# Patient Record
Sex: Male | Born: 1950 | Race: Black or African American | Hispanic: No | Marital: Married | Smoking: Never smoker
Health system: Southern US, Community
[De-identification: ages and names within clinical notes are randomized; demographics above are authoritative.]

## PROBLEM LIST (undated history)

## (undated) DIAGNOSIS — I10 Essential (primary) hypertension: Secondary | ICD-10-CM

## (undated) DIAGNOSIS — E669 Obesity, unspecified: Secondary | ICD-10-CM

## (undated) DIAGNOSIS — N183 Chronic kidney disease, stage 3 (moderate): Secondary | ICD-10-CM

## (undated) DIAGNOSIS — E785 Hyperlipidemia, unspecified: Secondary | ICD-10-CM

## (undated) DIAGNOSIS — R972 Elevated prostate specific antigen [PSA]: Secondary | ICD-10-CM

## (undated) DIAGNOSIS — I639 Cerebral infarction, unspecified: Secondary | ICD-10-CM

## (undated) DIAGNOSIS — C3412 Malignant neoplasm of upper lobe, left bronchus or lung: Secondary | ICD-10-CM

## (undated) DIAGNOSIS — K5730 Diverticulosis of large intestine without perforation or abscess without bleeding: Secondary | ICD-10-CM

## (undated) DIAGNOSIS — K648 Other hemorrhoids: Secondary | ICD-10-CM

## (undated) DIAGNOSIS — E7800 Pure hypercholesterolemia, unspecified: Secondary | ICD-10-CM

## (undated) DIAGNOSIS — I2699 Other pulmonary embolism without acute cor pulmonale: Secondary | ICD-10-CM

## (undated) DIAGNOSIS — E119 Type 2 diabetes mellitus without complications: Secondary | ICD-10-CM

## (undated) DIAGNOSIS — Z972 Presence of dental prosthetic device (complete) (partial): Secondary | ICD-10-CM

## (undated) HISTORY — DX: Chronic kidney disease, stage 3 (moderate): N18.3

## (undated) HISTORY — DX: Obesity, unspecified: E66.9

## (undated) HISTORY — DX: Hyperlipidemia, unspecified: E78.5

## (undated) HISTORY — DX: Type 2 diabetes mellitus without complications: E11.9

## (undated) HISTORY — PX: COLONOSCOPY: SHX174

## (undated) HISTORY — DX: Elevated prostate specific antigen (PSA): R97.20

## (undated) HISTORY — DX: Diverticulosis of large intestine without perforation or abscess without bleeding: K57.30

## (undated) HISTORY — DX: Pure hypercholesterolemia, unspecified: E78.00

## (undated) HISTORY — DX: Other hemorrhoids: K64.8

## (undated) HISTORY — DX: Malignant neoplasm of upper lobe, left bronchus or lung: C34.12

---

## 2009-12-02 ENCOUNTER — Ambulatory Visit: Payer: Self-pay | Admitting: Gastroenterology

## 2012-08-08 DIAGNOSIS — R972 Elevated prostate specific antigen [PSA]: Secondary | ICD-10-CM

## 2012-08-08 DIAGNOSIS — N403 Nodular prostate with lower urinary tract symptoms: Secondary | ICD-10-CM | POA: Insufficient documentation

## 2012-08-08 DIAGNOSIS — N509 Disorder of male genital organs, unspecified: Secondary | ICD-10-CM | POA: Insufficient documentation

## 2012-08-08 HISTORY — DX: Elevated prostate specific antigen (PSA): R97.20

## 2015-03-07 ENCOUNTER — Telehealth: Payer: Self-pay | Admitting: *Deleted

## 2015-03-07 NOTE — Telephone Encounter (Signed)
Patient called and said that he wanted to speak with Dr Manuella Ghazi because he had hurt his back last week and had gone to urgent care.  Said on Tuesday his stomach started hurting so he was calling to tell Dr Manuella Ghazi.  I told him that Dr Manuella Ghazi was seeing patients.  I suggested that he make an appt, which would be about 2 weeks from now or if he felt that this was an acute problem, he should go to urgent.  Stated that he didn't want to make and appt.

## 2015-03-18 ENCOUNTER — Encounter: Payer: Self-pay | Admitting: Family Medicine

## 2015-03-18 ENCOUNTER — Other Ambulatory Visit: Payer: Self-pay | Admitting: Family Medicine

## 2015-03-18 ENCOUNTER — Ambulatory Visit (INDEPENDENT_AMBULATORY_CARE_PROVIDER_SITE_OTHER): Payer: Managed Care, Other (non HMO) | Admitting: Family Medicine

## 2015-03-18 VITALS — BP 120/70 | HR 75 | Temp 98.8°F | Resp 18 | Ht 64.0 in | Wt 155.0 lb

## 2015-03-18 DIAGNOSIS — E785 Hyperlipidemia, unspecified: Secondary | ICD-10-CM | POA: Insufficient documentation

## 2015-03-18 DIAGNOSIS — N183 Chronic kidney disease, stage 3 unspecified: Secondary | ICD-10-CM | POA: Insufficient documentation

## 2015-03-18 DIAGNOSIS — R1013 Epigastric pain: Secondary | ICD-10-CM | POA: Diagnosis not present

## 2015-03-18 DIAGNOSIS — E119 Type 2 diabetes mellitus without complications: Secondary | ICD-10-CM

## 2015-03-18 DIAGNOSIS — E1122 Type 2 diabetes mellitus with diabetic chronic kidney disease: Secondary | ICD-10-CM | POA: Insufficient documentation

## 2015-03-18 HISTORY — DX: Hyperlipidemia, unspecified: E78.5

## 2015-03-18 MED ORDER — LISINOPRIL 10 MG PO TABS: 10.0000 mg | ORAL_TABLET | Freq: Every day | ORAL | Status: DC

## 2015-03-18 MED ORDER — METFORMIN HCL 1000 MG PO TABS: 1000.0000 mg | ORAL_TABLET | Freq: Two times a day (BID) | ORAL | Status: DC

## 2015-03-18 NOTE — Progress Notes (Signed)
Name: Melvin Willis   MRN: [id number]    DOB: 01-29-1951   Date:03/18/2015       Progress Note  Subjective  Chief Complaint  Chief Complaint  Patient presents with  . Follow-up    3 mo. DM  . Diabetes    Diabetes He presents for his follow-up diabetic visit. He has type 2 diabetes mellitus. Pertinent negatives for diabetes include no polydipsia and no polyuria. Current diabetic treatment includes oral agent (dual therapy). He is following a generally healthy diet. His breakfast blood glucose is taken between 8-9 am. His breakfast blood glucose range is generally 90-110 mg/dl.  Abdominal Pain This is a new problem. The current episode started 1 to 4 weeks ago. The onset quality is gradual. The most recent episode lasted 2 weeks. The pain is located in the epigastric region. The pain is at a severity of 2/10. The pain is mild. The quality of the pain is aching. The abdominal pain radiates to the epigastric region and LUQ. Associated symptoms include nausea. Pertinent negatives include no belching, constipation, diarrhea, hematochezia or vomiting. He has tried nothing for the symptoms. There is no history of gallstones, GERD or pancreatitis.    Past Medical History  Diagnosis Date  . Diabetes mellitus without complication   . Hyperlipidemia     History reviewed. No pertinent past surgical history.  Family History  Problem Relation Age of Onset  . Diabetes Mother   . Diabetes Father   . Diabetes Sister     History   Social History  . Marital Status: Married    Spouse Name: N/A  . Number of Children: N/A  . Years of Education: N/A   Occupational History  . Not on file.   Social History Main Topics  . Smoking status: Never Smoker   . Smokeless tobacco: Never Used  . Alcohol Use: No  . Drug Use: No  . Sexual Activity:    Partners: Female   Other Topics Concern  . Not on file   Social History Narrative  . No narrative on file     Current outpatient  prescriptions:  .  aspirin (BAYER ASPIRIN EC LOW DOSE) 81 MG EC tablet, Take 1 tablet by mouth daily., Disp: , Rfl:  .  finasteride (PROSCAR) 5 MG tablet, Take 1 tablet by mouth daily., Disp: , Rfl:  .  glipiZIDE (GLUCOTROL) 5 MG tablet, Take 2 tablets by mouth 2 (two) times daily at 8 am and 10 pm., Disp: , Rfl:  .  lisinopril (PRINIVIL,ZESTRIL) 10 MG tablet, Take 1 tablet (10 mg total) by mouth daily., Disp: 90 tablet, Rfl: 0 .  metFORMIN (GLUCOPHAGE) 1000 MG tablet, Take 1 tablet (1,000 mg total) by mouth 2 (two) times daily., Disp: 180 tablet, Rfl: 0 .  simvastatin (ZOCOR) 40 MG tablet, Take 1 tablet by mouth daily., Disp: , Rfl:  .  tamsulosin (FLOMAX) 0.4 MG CAPS capsule, Take 1 capsule by mouth daily., Disp: , Rfl:   No Known Allergies   Review of Systems  Gastrointestinal: Positive for nausea and abdominal pain. Negative for vomiting, diarrhea, constipation and hematochezia.  Endo/Heme/Allergies: Negative for polydipsia.      Objective  Filed Vitals:   03/18/15 0817  BP: 120/70  Pulse: 75  Temp: 98.8 F (37.1 C)  TempSrc: Oral  Resp: 18  Height: '5\' 4"'$  (1.626 m)  Weight: 155 lb (70.308 kg)  SpO2: 97%    Physical Exam  Constitutional: He is well-developed, well-nourished, and in no  distress.  HENT:  Head: Normocephalic and atraumatic.  Cardiovascular: Normal rate and regular rhythm.   Pulmonary/Chest: Effort normal and breath sounds normal.  Abdominal: Bowel sounds are normal. There is tenderness in the right upper quadrant, epigastric area and left upper quadrant.       No results found for this or any previous visit (from the past 2160 hour(s)).   Assessment & Plan 1. Diabetes mellitus without complication  - HgB I7P - metFORMIN (GLUCOPHAGE) 1000 MG tablet; Take 1 tablet (1,000 mg total) by mouth 2 (two) times daily.  Dispense: 180 tablet; Refill: 0 - lisinopril (PRINIVIL,ZESTRIL) 10 MG tablet; Take 1 tablet (10 mg total) by mouth daily.  Dispense: 90  tablet; Refill: 0  2. Dyslipidemia  - Lipid Profile - Comprehensive metabolic panel  3. Epigastric abdominal pain Discussed likely etiologies for acute epigastric abdominal pain, and nausea, most likely from gastritis. We will refer to gastroenterology for consideration of endoscopy and have recommended starting Zantac 150 mg at bedtime for relief. - Ambulatory referral to Gastroenterology - CBC - Amylase - Lipase   Melvin Willis Asad A. [facility] 03/18/2015 6:57 PM

## 2015-03-19 LAB — COMPREHENSIVE METABOLIC PANEL
ALT: 26 IU/L (ref 0–44)
AST: 17 IU/L (ref 0–40)
Albumin/Globulin Ratio: 1.5 (ref 1.1–2.5)
Albumin: 4.3 g/dL (ref 3.6–4.8)
Alkaline Phosphatase: 78 IU/L (ref 39–117)
BUN/Creatinine Ratio: 15 (ref 10–22)
BUN: 17 mg/dL (ref 8–27)
Bilirubin Total: 0.4 mg/dL (ref 0.0–1.2)
CALCIUM: 9.8 mg/dL (ref 8.6–10.2)
CO2: 21 mmol/L (ref 18–29)
Chloride: 97 mmol/L (ref 97–108)
Creatinine, Ser: 1.11 mg/dL (ref 0.76–1.27)
GFR calc Af Amer: 82 mL/min/{1.73_m2} (ref >59)
GFR calc non Af Amer: 71 mL/min/{1.73_m2} (ref >59)
GLOBULIN, TOTAL: 2.8 g/dL (ref 1.5–4.5)
GLUCOSE: 53 mg/dL — AB (ref 65–99)
POTASSIUM: 4.6 mmol/L (ref 3.5–5.2)
SODIUM: 136 mmol/L (ref 134–144)
Total Protein: 7.1 g/dL (ref 6.0–8.5)

## 2015-03-19 LAB — CBC
HEMATOCRIT: 45.1 % (ref 37.5–51.0)
Hemoglobin: 15.5 g/dL (ref 12.6–17.7)
MCH: 29.4 pg (ref 26.6–33.0)
MCHC: 34.4 g/dL (ref 31.5–35.7)
MCV: 86 fL (ref 79–97)
PLATELETS: 287 10*3/uL (ref 150–379)
RBC: 5.27 x10E6/uL (ref 4.14–5.80)
RDW: 14.2 % (ref 12.3–15.4)
WBC: 7.3 10*3/uL (ref 3.4–10.8)

## 2015-03-19 LAB — HEMOGLOBIN A1C
ESTIMATED AVERAGE GLUCOSE: 140 mg/dL
HEMOGLOBIN A1C: 6.5 % — AB (ref 4.8–5.6)

## 2015-03-19 LAB — LIPASE: LIPASE: 21 U/L (ref 0–59)

## 2015-03-19 LAB — LIPID PANEL
CHOLESTEROL TOTAL: 197 mg/dL (ref 100–199)
Chol/HDL Ratio: 3.8 ratio units (ref 0.0–5.0)
HDL: 52 mg/dL (ref >39)
LDL Calculated: 129 mg/dL — ABNORMAL HIGH (ref 0–99)
Triglycerides: 79 mg/dL (ref 0–149)
VLDL Cholesterol Cal: 16 mg/dL (ref 5–40)

## 2015-03-19 LAB — AMYLASE: Amylase: 85 U/L (ref 31–124)

## 2015-03-20 ENCOUNTER — Telehealth: Payer: Self-pay | Admitting: Gastroenterology

## 2015-03-20 NOTE — Telephone Encounter (Signed)
no Josem Kaufmann is req with [employer] for cpt: [id number], ref: [id number].

## 2015-04-16 ENCOUNTER — Ambulatory Visit (INDEPENDENT_AMBULATORY_CARE_PROVIDER_SITE_OTHER): Payer: Managed Care, Other (non HMO) | Admitting: Gastroenterology

## 2015-04-16 ENCOUNTER — Other Ambulatory Visit: Payer: Self-pay

## 2015-04-16 VITALS — BP 127/77 | HR 78 | Temp 98.3°F | Ht 65.0 in | Wt 152.2 lb

## 2015-04-16 DIAGNOSIS — K648 Other hemorrhoids: Secondary | ICD-10-CM

## 2015-04-16 DIAGNOSIS — K5730 Diverticulosis of large intestine without perforation or abscess without bleeding: Secondary | ICD-10-CM

## 2015-04-16 DIAGNOSIS — R110 Nausea: Secondary | ICD-10-CM

## 2015-04-16 DIAGNOSIS — E7800 Pure hypercholesterolemia, unspecified: Secondary | ICD-10-CM

## 2015-04-16 DIAGNOSIS — E663 Overweight: Secondary | ICD-10-CM | POA: Insufficient documentation

## 2015-04-16 DIAGNOSIS — R194 Change in bowel habit: Secondary | ICD-10-CM

## 2015-04-16 DIAGNOSIS — E669 Obesity, unspecified: Secondary | ICD-10-CM

## 2015-04-16 DIAGNOSIS — R1013 Epigastric pain: Secondary | ICD-10-CM

## 2015-04-16 HISTORY — DX: Diverticulosis of large intestine without perforation or abscess without bleeding: K57.30

## 2015-04-16 HISTORY — DX: Pure hypercholesterolemia, unspecified: E78.00

## 2015-04-16 HISTORY — DX: Obesity, unspecified: E66.9

## 2015-04-16 HISTORY — DX: Other hemorrhoids: K64.8

## 2015-04-16 NOTE — Progress Notes (Signed)
Gastroenterology Consultation  Referring Provider:     Roselee Nova, MD Primary Care Physician:  Melvin Rake, MD Primary Gastroenterologist:  Dr. Allen Willis     Reason for Consultation:     Change in bowel habits and nausea        HPI:   Melvin Willis is a 64 y.o. y/o male referred for consultation & management of change in bowel habits and nausea by Dr. Keith Rake, MD.  This patient comes in today with a report of 1 month history of a change in bowel habits with altered alternating diarrhea and normal stools. The patient also reports that he has had some nausea. The patient was feeling better when he was given Zantac but states he only took it for short time and stopped taking it. There is no report of any vomiting with the nausea. He also denies any black stools or bloody stools. The patient has no family history of colon cancer colon polyps or any GI malignancies that he is aware of. He reports that he has a 5 pound discrepancy between his visit today and his last visit and his primary care provider's office. The patient denies being woken up in the middle night by his diarrhea. The patient denies any overt heartburn.  Past Medical History  Diagnosis Date  . Diabetes mellitus without complication   . Hyperlipidemia   . Hemorrhoids, internal 04/16/2015  . Diverticulosis of sigmoid colon 04/16/2015  . Dyslipidemia 03/18/2015  . Abnormal prostate specific antigen 08/08/2012  . Hypercholesteremia 04/16/2015  . Adiposity 04/16/2015    History reviewed. No pertinent past surgical history.  Prior to Admission medications   Medication Sig Start Date End Date Taking? Authorizing Provider  aspirin (BAYER ASPIRIN EC LOW DOSE) 81 MG EC tablet Take 1 tablet by mouth daily.   Yes Historical Provider, MD  finasteride (PROSCAR) 5 MG tablet Take 1 tablet by mouth daily. 03/04/15  Yes Historical Provider, MD  glipiZIDE (GLUCOTROL) 5 MG tablet Take 2 tablets by mouth 2 (two) times daily at 8 am and 10 pm.  01/24/15  Yes Historical Provider, MD  lisinopril (PRINIVIL,ZESTRIL) 10 MG tablet Take 1 tablet (10 mg total) by mouth daily. 03/18/15  Yes Melvin Nova, MD  metFORMIN (GLUCOPHAGE) 1000 MG tablet Take 1 tablet (1,000 mg total) by mouth 2 (two) times daily. 03/18/15  Yes Melvin Nova, MD  simvastatin (ZOCOR) 40 MG tablet Take 1 tablet by mouth daily. 12/04/14  Yes Historical Provider, MD  tamsulosin (FLOMAX) 0.4 MG CAPS capsule Take 1 capsule by mouth daily. 01/24/15  Yes Historical Provider, MD    Family History  Problem Relation Age of Onset  . Diabetes Mother   . Diabetes Father   . Diabetes Sister      History  Substance Use Topics  . Smoking status: Never Smoker   . Smokeless tobacco: Never Used  . Alcohol Use: No    Allergies as of 04/16/2015  . (No Known Allergies)    Review of Systems:    All systems reviewed and negative except where noted in HPI.   Physical Exam:  BP 127/77 mmHg  Pulse 78  Temp(Src) 98.3 F (36.8 C) (Oral)  Ht '5\' 5"'$  (1.651 m)  Wt 152 lb 3.2 oz (69.037 kg)  BMI 25.33 kg/m2 No LMP for male patient. Psych:  Alert and cooperative. Normal mood and affect. General:   Alert,  Well-developed, well-nourished, pleasant and cooperative in NAD Head:  Normocephalic  and atraumatic. Eyes:  Sclera clear, no icterus.   Conjunctiva pink. Ears:  Normal auditory acuity. Nose:  No deformity, discharge, or lesions. Mouth:  No deformity or lesions,oropharynx pink & moist. Neck:  Supple; no masses or thyromegaly. Lungs:  Respirations even and unlabored.  Clear throughout to auscultation.   No wheezes, crackles, or rhonchi. No acute distress. Heart:  Regular rate and rhythm; no murmurs, clicks, rubs, or gallops. Abdomen:  Normal bowel sounds.  No bruits.  Soft, non-tender and non-distended without masses, hepatosplenomegaly or hernias noted.  No guarding or rebound tenderness.  Negative Carnett sign.   Rectal:  Deferred.  Msk:  Symmetrical without gross  deformities.  Good, equal movement & strength bilaterally. Pulses:  Normal pulses noted. Extremities:  No clubbing or edema.  No cyanosis. Neurologic:  Alert and oriented x3;  grossly normal neurologically. Skin:  Intact without significant lesions or rashes.  No jaundice. Lymph Nodes:  No significant cervical adenopathy. Psych:  Alert and cooperative. Normal mood and affect.  Imaging Studies: No results found.  Assessment and Plan:   Melvin Willis is a 64 y.o. y/o male comes in today with a report of 1 month long history of a change in bowel habits with intermittent diarrhea and nausea. The patient was being helped by Zantac but stopped taking it. The patient then reports the symptoms came back. The patient will be set up for an EGD and colonoscopy to evaluate his change in bowel habits and his nausea. The patient reports that his last colonoscopy was over 10 years ago. The patient will also be set up for blood test to check for H. pylori. The patient has been explained the plan and agrees with it. I have discussed risks & benefits which include, but are not limited to, bleeding, infection, perforation & drug reaction.  The patient agrees with this plan & written consent will be obtained.      Note: This dictation was prepared with Dragon dictation along with smaller phrase technology. Any transcriptional errors that result from this process are unintentional.

## 2015-05-01 ENCOUNTER — Encounter: Payer: Self-pay | Admitting: *Deleted

## 2015-05-03 NOTE — Discharge Instructions (Signed)

## 2015-05-06 ENCOUNTER — Other Ambulatory Visit: Payer: Self-pay | Admitting: Gastroenterology

## 2015-05-06 ENCOUNTER — Ambulatory Visit
Admission: RE | Admit: 2015-05-06 | Discharge: 2015-05-06 | Disposition: A | Discharge status: Home / Self Care | Payer: Managed Care, Other (non HMO) | Source: Ambulatory Visit | Attending: Gastroenterology | Admitting: Gastroenterology

## 2015-05-06 ENCOUNTER — Ambulatory Visit: Payer: Managed Care, Other (non HMO) | Admitting: Anesthesiology

## 2015-05-06 ENCOUNTER — Encounter
Admission: RE | Disposition: A | Discharge status: Home / Self Care | Payer: Self-pay | Source: Ambulatory Visit | Attending: Gastroenterology

## 2015-05-06 DIAGNOSIS — K635 Polyp of colon: Secondary | ICD-10-CM | POA: Insufficient documentation

## 2015-05-06 DIAGNOSIS — K2950 Unspecified chronic gastritis without bleeding: Secondary | ICD-10-CM | POA: Insufficient documentation

## 2015-05-06 DIAGNOSIS — D123 Benign neoplasm of transverse colon: Secondary | ICD-10-CM | POA: Insufficient documentation

## 2015-05-06 DIAGNOSIS — D122 Benign neoplasm of ascending colon: Secondary | ICD-10-CM | POA: Insufficient documentation

## 2015-05-06 DIAGNOSIS — R110 Nausea: Secondary | ICD-10-CM | POA: Insufficient documentation

## 2015-05-06 DIAGNOSIS — E119 Type 2 diabetes mellitus without complications: Secondary | ICD-10-CM | POA: Diagnosis not present

## 2015-05-06 DIAGNOSIS — R194 Change in bowel habit: Secondary | ICD-10-CM | POA: Diagnosis not present

## 2015-05-06 DIAGNOSIS — D125 Benign neoplasm of sigmoid colon: Secondary | ICD-10-CM | POA: Insufficient documentation

## 2015-05-06 DIAGNOSIS — K5730 Diverticulosis of large intestine without perforation or abscess without bleeding: Secondary | ICD-10-CM | POA: Diagnosis not present

## 2015-05-06 DIAGNOSIS — Z7982 Long term (current) use of aspirin: Secondary | ICD-10-CM | POA: Insufficient documentation

## 2015-05-06 DIAGNOSIS — K641 Second degree hemorrhoids: Secondary | ICD-10-CM | POA: Diagnosis not present

## 2015-05-06 DIAGNOSIS — K209 Esophagitis, unspecified without bleeding: Secondary | ICD-10-CM | POA: Insufficient documentation

## 2015-05-06 DIAGNOSIS — E785 Hyperlipidemia, unspecified: Secondary | ICD-10-CM | POA: Diagnosis not present

## 2015-05-06 DIAGNOSIS — I10 Essential (primary) hypertension: Secondary | ICD-10-CM | POA: Insufficient documentation

## 2015-05-06 DIAGNOSIS — K2970 Gastritis, unspecified, without bleeding: Secondary | ICD-10-CM | POA: Diagnosis not present

## 2015-05-06 HISTORY — PX: COLONOSCOPY WITH PROPOFOL: SHX5780

## 2015-05-06 HISTORY — DX: Essential (primary) hypertension: I10

## 2015-05-06 HISTORY — DX: Presence of dental prosthetic device (complete) (partial): Z97.2

## 2015-05-06 HISTORY — PX: ESOPHAGOGASTRODUODENOSCOPY (EGD) WITH PROPOFOL: SHX5813

## 2015-05-06 LAB — GLUCOSE, CAPILLARY
Glucose-Capillary: 159 mg/dL — ABNORMAL HIGH (ref 65–99)
Glucose-Capillary: 128 mg/dL — ABNORMAL HIGH (ref 65–99)

## 2015-05-06 SURGERY — COLONOSCOPY WITH PROPOFOL
Anesthesia: Monitor Anesthesia Care | Wound class: Contaminated

## 2015-05-06 MED ORDER — SODIUM CHLORIDE 0.9 % IV SOLN: INTRAVENOUS | Status: DC

## 2015-05-06 MED ORDER — ACETAMINOPHEN 325 MG PO TABS: 325.0000 mg | ORAL_TABLET | ORAL | Status: DC | PRN

## 2015-05-06 MED ORDER — STERILE WATER FOR IRRIGATION IR SOLN
Status: DC | PRN
  Administered 2015-05-06: 08:00:00

## 2015-05-06 MED ORDER — GLYCOPYRROLATE 0.2 MG/ML IJ SOLN
INTRAMUSCULAR | Status: DC | PRN
  Administered 2015-05-06: 0.1 mg via INTRAVENOUS

## 2015-05-06 MED ORDER — ACETAMINOPHEN 160 MG/5ML PO SOLN: 325.0000 mg | ORAL | Status: DC | PRN

## 2015-05-06 MED ORDER — LACTATED RINGERS IV SOLN
INTRAVENOUS | Status: DC
  Administered 2015-05-06: 07:00:00 via INTRAVENOUS

## 2015-05-06 MED ORDER — LACTATED RINGERS IV SOLN: INTRAVENOUS | Status: DC

## 2015-05-06 MED ORDER — LIDOCAINE HCL (CARDIAC) 20 MG/ML IV SOLN
INTRAVENOUS | Status: DC | PRN
  Administered 2015-05-06: 50 mg via INTRAVENOUS

## 2015-05-06 MED ORDER — PROPOFOL 10 MG/ML IV BOLUS
INTRAVENOUS | Status: DC | PRN
  Administered 2015-05-06: 20 mg via INTRAVENOUS
  Administered 2015-05-06: 30 mg via INTRAVENOUS
  Administered 2015-05-06: 20 mg via INTRAVENOUS
  Administered 2015-05-06: 30 mg via INTRAVENOUS
  Administered 2015-05-06: 20 mg via INTRAVENOUS
  Administered 2015-05-06: 60 mg via INTRAVENOUS
  Administered 2015-05-06 (×3): 30 mg via INTRAVENOUS

## 2015-05-06 SURGICAL SUPPLY — 39 items
BALLN DILATOR 10-12 8 (BALLOONS)
BALLN DILATOR 12-15 8 (BALLOONS)
BALLN DILATOR 15-18 8 (BALLOONS)
BALLN DILATOR CRE 0-12 8 (BALLOONS)
BALLN DILATOR ESOPH 8 10 CRE (MISCELLANEOUS) IMPLANT
BALLOON DILATOR 12-15 8 (BALLOONS) IMPLANT
BALLOON DILATOR 15-18 8 (BALLOONS) IMPLANT
BALLOON DILATOR CRE 0-12 8 (BALLOONS) IMPLANT
BLOCK BITE 60FR ADLT L/F GRN (MISCELLANEOUS) ×3 IMPLANT
CANISTER SUCT 1200ML W/VALVE (MISCELLANEOUS) ×3 IMPLANT
FCP ESCP3.2XJMB 240X2.8X (MISCELLANEOUS)
FORCEPS BIOP RAD 4 LRG CAP 4 (CUTTING FORCEPS) ×3 IMPLANT
FORCEPS BIOP RJ4 240 W/NDL (MISCELLANEOUS)
FORCEPS ESCP3.2XJMB 240X2.8X (MISCELLANEOUS) IMPLANT
GOWN CVR UNV OPN BCK APRN NK (MISCELLANEOUS) ×2 IMPLANT
GOWN ISOL THUMB LOOP REG UNIV (MISCELLANEOUS) ×4
HEMOCLIP INSTINCT (CLIP) IMPLANT
INJECTOR VARIJECT VIN23 (MISCELLANEOUS) IMPLANT
KIT CO2 TUBING (TUBING) IMPLANT
KIT DEFENDO VALVE AND CONN (KITS) IMPLANT
KIT ENDO PROCEDURE OLY (KITS) ×3 IMPLANT
LIGATOR MULTIBAND 6SHOOTER MBL (MISCELLANEOUS) IMPLANT
MARKER SPOT ENDO TATTOO 5ML (MISCELLANEOUS) IMPLANT
PAD GROUND ADULT SPLIT (MISCELLANEOUS) IMPLANT
SNARE SHORT THROW 13M SML OVAL (MISCELLANEOUS) ×3 IMPLANT
SNARE SHORT THROW 30M LRG OVAL (MISCELLANEOUS) IMPLANT
SPOT EX ENDOSCOPIC TATTOO (MISCELLANEOUS)
SUCTION POLY TRAP 4CHAMBER (MISCELLANEOUS) IMPLANT
SYR INFLATION 60ML (SYRINGE) IMPLANT
TRAP SUCTION POLY (MISCELLANEOUS) IMPLANT
TUBING CONN 6MMX3.1M (TUBING)
TUBING SUCTION CONN 0.25 STRL (TUBING) IMPLANT
UNDERPAD 30X60 958B10 (PK) (MISCELLANEOUS) IMPLANT
VALVE BIOPSY ENDO (VALVE) IMPLANT
VARIJECT INJECTOR VIN23 (MISCELLANEOUS)
WATER AUXILLARY (MISCELLANEOUS) IMPLANT
WATER STERILE IRR 250ML POUR (IV SOLUTION) ×3 IMPLANT
WATER STERILE IRR 500ML POUR (IV SOLUTION) IMPLANT
WIRE CRE 18-20MM 8CM F G (MISCELLANEOUS) IMPLANT

## 2015-05-06 NOTE — Anesthesia Procedure Notes (Signed)
Procedure Name: MAC Performed by: Dejaun Vidrio Pre-anesthesia Checklist: Patient identified, Emergency Drugs available, Suction available, Timeout performed and Patient being monitored Patient Re-evaluated:Patient Re-evaluated prior to inductionOxygen Delivery Method: Nasal cannula Placement Confirmation: positive ETCO2       

## 2015-05-06 NOTE — Op Note (Signed)
[facility] Gastroenterology Patient Name: Melvin Willis Procedure Date: 05/06/2015 7:30 AM MRN: [id number] Account #: [id number] Date of Birth: Mar 31, 1951 Admit Type: Outpatient Age: 64 Room: [facility] OR ROOM 01 Gender: Male Note Status: Finalized Procedure:         Upper GI endoscopy Indications:       Nausea Providers:         Lucilla Lame, MD Referring MD:      Otila Back. Manuella Ghazi (Referring MD) Medicines:         Propofol per Anesthesia Complications:     No immediate complications. Procedure:         Pre-Anesthesia Assessment:                    - Prior to the procedure, a History and Physical was                     performed, and patient medications and allergies were                     reviewed. The patient's tolerance of previous anesthesia                     was also reviewed. The risks and benefits of the procedure                     and the sedation options and risks were discussed with the                     patient. All questions were answered, and informed consent                     was obtained. Prior Anticoagulants: The patient has taken                     no previous anticoagulant or antiplatelet agents. ASA                     Grade Assessment: II - A patient with mild systemic                     disease. After reviewing the risks and benefits, the                     patient was deemed in satisfactory condition to undergo                     the procedure.                    After obtaining informed consent, the endoscope was passed                     under direct vision. Throughout the procedure, the                     patient's blood pressure, pulse, and oxygen saturations                     were monitored continuously. The Olympus GIF-HQ190                     Endoscope (S#. [id number]) was introduced through the mouth,  and advanced to the second part of duodenum. The upper GI                     endoscopy was  accomplished without difficulty. The patient                     tolerated the procedure well. Findings:      LA Grade A (one or more mucosal breaks less than 5 mm, not extending       between tops of 2 mucosal folds) esophagitis with no bleeding was found       at the gastroesophageal junction.      Localized mild inflammation characterized by erythema was found in the       gastric antrum. Biopsies were taken with a cold forceps for histology.       Biopsies were taken with a cold forceps for histology.      The examined duodenum was normal. Impression:        - LA Grade A esophagitis.                    - Gastritis. Biopsied.                    - Normal examined duodenum. Recommendation:    - Await pathology results.                    - Perform a colonoscopy today. Procedure Code(s): --- Professional ---                    [phone], Esophagogastroduodenoscopy, flexible, transoral;                     with biopsy, single or multiple Diagnosis Code(s): --- Professional ---                    R11.0, Nausea                    K20.9, Esophagitis, unspecified                    K29.70, Gastritis, unspecified, without bleeding CPT copyright 2014 American Medical Association. All rights reserved. The codes documented in this report are preliminary and upon coder review may  be revised to meet current compliance requirements. Lucilla Lame, MD 05/06/2015 7:40:46 AM This report has been signed electronically. Number of Addenda: 0 Note Initiated On: 05/06/2015 7:30 AM      [facility]

## 2015-05-06 NOTE — Anesthesia Preprocedure Evaluation (Signed)
Anesthesia Evaluation  Patient identified by MRN, date of birth, ID band  Reviewed: Allergy & Precautions, H&P , NPO status , Patient's Chart, lab work & pertinent test results  Airway Mallampati: I  TM Distance: >3 FB Neck ROM: full    Dental no notable dental hx.    Pulmonary    Pulmonary exam normal       Cardiovascular hypertension, Rhythm:regular Rate:Normal     Neuro/Psych    GI/Hepatic   Endo/Other  diabetes  Renal/GU      Musculoskeletal   Abdominal   Peds  Hematology   Anesthesia Other Findings   Reproductive/Obstetrics                             Anesthesia Physical Anesthesia Plan  ASA: II  Anesthesia Plan: MAC   Post-op Pain Management:    Induction:   Airway Management Planned:   Additional Equipment:   Intra-op Plan:   Post-operative Plan:   Informed Consent: I have reviewed the patients History and Physical, chart, labs and discussed the procedure including the risks, benefits and alternatives for the proposed anesthesia with the patient or authorized representative who has indicated his/her understanding and acceptance.     Plan Discussed with: CRNA  Anesthesia Plan Comments:         Anesthesia Quick Evaluation

## 2015-05-06 NOTE — Op Note (Signed)
[facility] Gastroenterology Patient Name: Melvin Willis Procedure Date: 05/06/2015 7:41 AM MRN: [id number] Account #: [id number] Date of Birth: 1951/02/22 Admit Type: Outpatient Age: 64 Room: [facility] OR ROOM 01 Gender: Male Note Status: Finalized Procedure:         Colonoscopy Indications:       Change in bowel habits Providers:         Lucilla Lame, MD Referring MD:      Otila Back. Manuella Ghazi (Referring MD) Medicines:         Propofol per Anesthesia Complications:     No immediate complications. Procedure:         Pre-Anesthesia Assessment:                    - Prior to the procedure, a History and Physical was                     performed, and patient medications and allergies were                     reviewed. The patient's tolerance of previous anesthesia                     was also reviewed. The risks and benefits of the procedure                     and the sedation options and risks were discussed with the                     patient. All questions were answered, and informed consent                     was obtained. Prior Anticoagulants: The patient has taken                     no previous anticoagulant or antiplatelet agents. ASA                     Grade Assessment: II - A patient with mild systemic                     disease. After reviewing the risks and benefits, the                     patient was deemed in satisfactory condition to undergo                     the procedure.                    After obtaining informed consent, the colonoscope was                     passed under direct vision. Throughout the procedure, the                     patient's blood pressure, pulse, and oxygen saturations                     were monitored continuously. The was introduced through                     the anus and advanced to the the terminal ileum. The  colonoscopy was performed without difficulty. The patient                     tolerated  the procedure well. The quality of the bowel                     preparation was excellent. Findings:      The perianal and digital rectal examinations were normal.      Two sessile polyps were found in the ascending colon. The polyps were 4       to 5 mm in size. These polyps were removed with a cold biopsy forceps.       Resection and retrieval were complete.      A 7 mm polyp was found in the transverse colon. The polyp was sessile.       The polyp was removed with a cold snare. Resection and retrieval were       complete.      A 3 mm polyp was found in the sigmoid colon. The polyp was sessile. The       polyp was removed with a cold biopsy forceps. Resection and retrieval       were complete.      Multiple small-mouthed diverticula were found in the sigmoid colon.      Non-bleeding internal hemorrhoids were found during retroflexion. The       hemorrhoids were Grade II (internal hemorrhoids that prolapse but reduce       spontaneously).      The terminal ileum appeared normal. Biopsies were taken with a cold       forceps for histology.      Random biopsies were obtained with cold forceps for histology randomly       in the entire colon. Impression:        - Two 4 to 5 mm polyps in the ascending colon. Resected                     and retrieved.                    - One 7 mm polyp in the transverse colon. Resected and                     retrieved.                    - One 3 mm polyp in the sigmoid colon. Resected and                     retrieved.                    - Diverticulosis in the sigmoid colon.                    - Non-bleeding internal hemorrhoids.                    - The examined portion of the ileum was normal. Biopsied.                    - Random biopsies were obtained in the entire colon. Recommendation:    - Repeat colonoscopy in 5 years if polyp adenoma and 10                     years if hyperplastic Procedure  Code(s): --- Professional ---                     [phone], Colonoscopy, flexible; with removal of tumor(s),                     polyp(s), or other lesion(s) by snare technique                    [id number], 59, Colonoscopy, flexible; with biopsy, single or                     multiple Diagnosis Code(s): --- Professional ---                    R19.4, Change in bowel habit                    D12.2, Benign neoplasm of ascending colon                    D12.3, Benign neoplasm of transverse colon                    D12.5, Benign neoplasm of sigmoid colon                    K57.30, Diverticulosis of large intestine without                     perforation or abscess without bleeding CPT copyright 2014 American Medical Association. All rights reserved. The codes documented in this report are preliminary and upon coder review may  be revised to meet current compliance requirements. Lucilla Lame, MD 05/06/2015 8:00:56 AM This report has been signed electronically. Number of Addenda: 0 Note Initiated On: 05/06/2015 7:41 AM Scope Withdrawal Time: 0 hours 9 minutes 13 seconds  Total Procedure Duration: 0 hours 11 minutes 53 seconds       [facility]

## 2015-05-06 NOTE — Anesthesia Postprocedure Evaluation (Signed)
  Anesthesia Post-op Note  Patient: Melvin Willis  Procedure(s) Performed: Procedure(s) with comments: COLONOSCOPY WITH PROPOFOL (N/A) - ASCENDING COLON POLYPS X 2 TERMINAL ILEUM BIOPSY RANDOM COLON BX. TRANSVERSE COLON POLYP SIGMOID COLON POLYP ESOPHAGOGASTRODUODENOSCOPY (EGD) WITH PROPOFOL (N/A) - GASTRIC BIOPSY X1  Anesthesia type:MAC  Patient location: PACU  Post pain: Pain level controlled  Post assessment: Post-op Vital signs reviewed, Patient's Cardiovascular Status Stable, Respiratory Function Stable, Patent Airway and No signs of Nausea or vomiting  Post vital signs: Reviewed and stable  Last Vitals:  Filed Vitals:   05/06/15 0814  BP: 115/81  Pulse: 77  Temp:   Resp: 26    Level of consciousness: awake, alert  and patient cooperative  Complications: No apparent anesthesia complications

## 2015-05-06 NOTE — H&P (Signed)
[facility]  [address] [place], [place] [address] Phone: [phone] Fax : [phone]  Primary Care Physician:  Keith Rake, MD Primary Gastroenterologist:  Dr. Allen Norris  Pre-Procedure History & Physical: HPI:  Melvin Willis is a 64 y.o. male is here for an endoscopy and colonoscopy.   Past Medical History  Diagnosis Date  . Diabetes mellitus without complication   . Hyperlipidemia   . Hemorrhoids, internal 04/16/2015  . Diverticulosis of sigmoid colon 04/16/2015  . Dyslipidemia 03/18/2015  . Abnormal prostate specific antigen 08/08/2012  . Hypercholesteremia 04/16/2015  . Adiposity 04/16/2015  . Wears dentures     partial upper  . Hypertension     Past Surgical History  Procedure Laterality Date  . Colonoscopy      Prior to Admission medications   Medication Sig Start Date End Date Taking? Authorizing Provider  aspirin (BAYER ASPIRIN EC LOW DOSE) 81 MG EC tablet Take 1 tablet by mouth daily.   Yes Historical Provider, MD  finasteride (PROSCAR) 5 MG tablet Take 1 tablet by mouth daily. 03/04/15  Yes Historical Provider, MD  glipiZIDE (GLUCOTROL) 5 MG tablet Take 2 tablets by mouth 2 (two) times daily at 8 am and 10 pm. 01/24/15  Yes Historical Provider, MD  lisinopril (PRINIVIL,ZESTRIL) 10 MG tablet Take 1 tablet (10 mg total) by mouth daily. 03/18/15  Yes Roselee Nova, MD  metFORMIN (GLUCOPHAGE) 1000 MG tablet Take 1 tablet (1,000 mg total) by mouth 2 (two) times daily. 03/18/15  Yes Roselee Nova, MD  simvastatin (ZOCOR) 40 MG tablet Take 1 tablet by mouth daily. 12/04/14  Yes Historical Provider, MD  tamsulosin (FLOMAX) 0.4 MG CAPS capsule Take 1 capsule by mouth daily. 01/24/15  Yes Historical Provider, MD    Allergies as of 04/16/2015  . (No Known Allergies)    Family History  Problem Relation Age of Onset  . Diabetes Mother   . Diabetes Father   . Diabetes Sister     Social History   Social History  . Marital Status: Married   Spouse Name: N/A  . Number of Children: N/A  . Years of Education: N/A   Occupational History  . Not on file.   Social History Main Topics  . Smoking status: Never Smoker   . Smokeless tobacco: Never Used  . Alcohol Use: No  . Drug Use: No  . Sexual Activity:    Partners: Female   Other Topics Concern  . Not on file   Social History Narrative    Review of Systems: See HPI, otherwise negative ROS  Physical Exam: BP 128/84 mmHg  Pulse 88  Temp(Src) 97.5 F (36.4 C) (Temporal)  Resp 16  Ht '5\' 5"'$  (1.651 m)  Wt 149 lb (67.586 kg)  BMI 24.79 kg/m2  SpO2 99% General:   Alert,  pleasant and cooperative in NAD Head:  Normocephalic and atraumatic. Neck:  Supple; no masses or thyromegaly. Lungs:  Clear throughout to auscultation.    Heart:  Regular rate and rhythm. Abdomen:  Soft, nontender and nondistended. Normal bowel sounds, without guarding, and without rebound.   Neurologic:  Alert and  oriented x4;  grossly normal neurologically.  Impression/Plan: Melvin Willis is here for an endoscopy and colonoscopy to be performed for nausea and change in bowel habits.  Risks, benefits, limitations, and alternatives regarding  endoscopy and colonoscopy have been reviewed with the patient.  Questions have been answered.  All parties agreeable.   Ollen Bowl, MD  05/06/2015, 7:27 AM

## 2015-05-06 NOTE — Transfer of Care (Signed)
Immediate Anesthesia Transfer of Care Note  Patient: Melvin Willis  Procedure(s) Performed: Procedure(s) with comments: COLONOSCOPY WITH PROPOFOL (N/A) - ASCENDING COLON POLYPS X 2 TERMINAL ILEUM BIOPSY RANDOM COLON BX. TRANSVERSE COLON POLYP SIGMOID COLON POLYP ESOPHAGOGASTRODUODENOSCOPY (EGD) WITH PROPOFOL (N/A) - GASTRIC BIOPSY X1  Patient Location: PACU  Anesthesia Type: MAC  Level of Consciousness: awake, alert  and patient cooperative  Airway and Oxygen Therapy: Patient Spontanous Breathing and Patient connected to supplemental oxygen  Post-op Assessment: Post-op Vital signs reviewed, Patient's Cardiovascular Status Stable, Respiratory Function Stable, Patent Airway and No signs of Nausea or vomiting  Post-op Vital Signs: Reviewed and stable  Complications: No apparent anesthesia complications

## 2015-05-07 ENCOUNTER — Encounter: Payer: Self-pay | Admitting: Gastroenterology

## 2015-06-12 ENCOUNTER — Encounter: Payer: Self-pay | Admitting: Family Medicine

## 2015-06-12 ENCOUNTER — Ambulatory Visit (INDEPENDENT_AMBULATORY_CARE_PROVIDER_SITE_OTHER): Payer: Managed Care, Other (non HMO) | Admitting: Family Medicine

## 2015-06-12 VITALS — BP 126/70 | HR 79 | Temp 98.8°F | Resp 19 | Ht 65.0 in | Wt 156.6 lb

## 2015-06-12 DIAGNOSIS — E119 Type 2 diabetes mellitus without complications: Secondary | ICD-10-CM

## 2015-06-12 DIAGNOSIS — E7800 Pure hypercholesterolemia, unspecified: Secondary | ICD-10-CM | POA: Diagnosis not present

## 2015-06-12 LAB — GLUCOSE, POCT (MANUAL RESULT ENTRY): POC GLUCOSE: 97 mg/dL (ref 70–99)

## 2015-06-12 LAB — POCT GLYCOSYLATED HEMOGLOBIN (HGB A1C): HEMOGLOBIN A1C: 6.7

## 2015-06-12 MED ORDER — LISINOPRIL 10 MG PO TABS: 10.0000 mg | ORAL_TABLET | Freq: Every day | ORAL | Status: DC

## 2015-06-12 MED ORDER — METFORMIN HCL 1000 MG PO TABS: 500.0000 mg | ORAL_TABLET | Freq: Two times a day (BID) | ORAL | Status: DC

## 2015-06-12 MED ORDER — GLIPIZIDE 5 MG PO TABS: 5.0000 mg | ORAL_TABLET | Freq: Two times a day (BID) | ORAL | Status: DC

## 2015-06-12 MED ORDER — SIMVASTATIN 40 MG PO TABS: 40.0000 mg | ORAL_TABLET | Freq: Every day | ORAL | Status: DC

## 2015-06-12 NOTE — Progress Notes (Signed)
Name: Melvin Willis   MRN: [id number]    DOB: Mar 13, 1951   Date:06/12/2015       Progress Note  Subjective  Chief Complaint  Chief Complaint  Patient presents with  . Annual Exam    CPE    Diabetes He presents for his follow-up diabetic visit. He has type 2 diabetes mellitus. His disease course has been stable. Hypoglycemia symptoms include dizziness and sleepiness. There are no diabetic associated symptoms. Pertinent negatives for diabetes include no chest pain, no fatigue, no polydipsia and no polyuria. Symptoms are stable. Current diabetic treatment includes oral agent (dual therapy). He is following a diabetic diet. His breakfast blood glucose range is generally 110-130 mg/dl. An ACE inhibitor/angiotensin II receptor blocker is being taken.  Hyperlipidemia This is a chronic problem. The problem is controlled. Recent lipid tests were reviewed and are normal. Pertinent negatives include no chest pain, leg pain, myalgias or shortness of breath. Current antihyperlipidemic treatment includes statins.    Past Medical History  Diagnosis Date  . Diabetes mellitus without complication ([place])   . Hyperlipidemia   . Hemorrhoids, internal 04/16/2015  . Diverticulosis of sigmoid colon 04/16/2015  . Dyslipidemia 03/18/2015  . Abnormal prostate specific antigen 08/08/2012  . Hypercholesteremia 04/16/2015  . Adiposity 04/16/2015  . Wears dentures     partial upper  . Hypertension     Past Surgical History  Procedure Laterality Date  . Colonoscopy    . Colonoscopy with propofol N/A 05/06/2015    Procedure: COLONOSCOPY WITH PROPOFOL;  Surgeon: Lucilla Lame, MD;  Location: [facility];  Service: Endoscopy;  Laterality: N/A;  ASCENDING COLON POLYPS X 2 TERMINAL ILEUM BIOPSY RANDOM COLON BX. TRANSVERSE COLON POLYP SIGMOID COLON POLYP  . Esophagogastroduodenoscopy (egd) with propofol N/A 05/06/2015    Procedure: ESOPHAGOGASTRODUODENOSCOPY (EGD) WITH PROPOFOL;  Surgeon: Lucilla Lame, MD;   Location: [facility];  Service: Endoscopy;  Laterality: N/A;  GASTRIC BIOPSY X1    Family History  Problem Relation Age of Onset  . Diabetes Mother   . Diabetes Father   . Diabetes Sister     Social History   Social History  . Marital Status: Married    Spouse Name: N/A  . Number of Children: N/A  . Years of Education: N/A   Occupational History  . Not on file.   Social History Main Topics  . Smoking status: Never Smoker   . Smokeless tobacco: Never Used  . Alcohol Use: No  . Drug Use: No  . Sexual Activity:    Partners: Female   Other Topics Concern  . Not on file   Social History Narrative    Current outpatient prescriptions:  .  aspirin (BAYER ASPIRIN EC LOW DOSE) 81 MG EC tablet, Take 1 tablet by mouth daily., Disp: , Rfl:  .  finasteride (PROSCAR) 5 MG tablet, Take 1 tablet by mouth daily., Disp: , Rfl:  .  glipiZIDE (GLUCOTROL) 5 MG tablet, Take 2 tablets by mouth 2 (two) times daily at 8 am and 10 pm., Disp: , Rfl:  .  lisinopril (PRINIVIL,ZESTRIL) 10 MG tablet, Take 1 tablet (10 mg total) by mouth daily., Disp: 90 tablet, Rfl: 0 .  metFORMIN (GLUCOPHAGE) 1000 MG tablet, Take 1 tablet (1,000 mg total) by mouth 2 (two) times daily., Disp: 180 tablet, Rfl: 0 .  simvastatin (ZOCOR) 40 MG tablet, Take 1 tablet by mouth daily., Disp: , Rfl:  .  tamsulosin (FLOMAX) 0.4 MG CAPS capsule, Take 1 capsule by mouth daily.,  Disp: , Rfl:   No Known Allergies  Review of Systems  Constitutional: Negative for fatigue.  Respiratory: Negative for shortness of breath.   Cardiovascular: Negative for chest pain.  Musculoskeletal: Negative for myalgias.  Neurological: Positive for dizziness.  Endo/Heme/Allergies: Negative for polydipsia.   Objective  Filed Vitals:   06/12/15 0932  BP: 126/70  Pulse: 79  Temp: 98.8 F (37.1 C)  TempSrc: Oral  Resp: 19  Height: '5\' 5"'$  (1.651 m)  Weight: 156 lb 9.6 oz (71.033 kg)  SpO2: 98%   Physical Exam  Constitutional: He  is oriented to person, place, and time and well-developed, well-nourished, and in no distress.  HENT:  Head: Normocephalic and atraumatic.  Cardiovascular: Normal rate and regular rhythm.   Pulmonary/Chest: Effort normal and breath sounds normal.  Abdominal: Soft. Bowel sounds are normal.  Neurological: He is alert and oriented to person, place, and time.  Psychiatric: Memory, affect and judgment normal.  Nursing note and vitals reviewed.   Assessment & Plan  1. Diabetes mellitus without complication (HCC) T9N of 6.7%, consistent with well-controlled diabetes. Changed to glipizide to 5 mg twice a day and metformin to 500 mg twice a day. Patient encouraged to continue checking his blood sugar and follow-up in 3 months. - POCT Glucose (CBG) - POCT HgB A1C - metFORMIN (GLUCOPHAGE) 1000 MG tablet; Take 0.5 tablets (500 mg total) by mouth 2 (two) times daily.  Dispense: 90 tablet; Refill: 0 - lisinopril (PRINIVIL,ZESTRIL) 10 MG tablet; Take 1 tablet (10 mg total) by mouth daily.  Dispense: 90 tablet; Refill: 0 - glipiZIDE (GLUCOTROL) 5 MG tablet; Take 1 tablet (5 mg total) by mouth 2 (two) times daily before a meal.  Dispense: 180 tablet; Refill: 0  2. Hypercholesteremia Elevated LDL in July 2016. Continue simvastatin and recheck FLP today. - Lipid Profile - Comprehensive Metabolic Panel (CMET) - simvastatin (ZOCOR) 40 MG tablet; Take 1 tablet (40 mg total) by mouth daily.  Dispense: 90 tablet; Refill: 0   Shenia Alan Asad A. [facility] 06/12/2015 10:11 AM

## 2015-06-13 LAB — LIPID PANEL
CHOLESTEROL TOTAL: 184 mg/dL (ref 100–199)
Chol/HDL Ratio: 3.5 ratio units (ref 0.0–5.0)
HDL: 53 mg/dL (ref >39)
LDL CALC: 117 mg/dL — AB (ref 0–99)
Triglycerides: 70 mg/dL (ref 0–149)
VLDL Cholesterol Cal: 14 mg/dL (ref 5–40)

## 2015-06-13 LAB — COMPREHENSIVE METABOLIC PANEL
ALT: 26 IU/L (ref 0–44)
AST: 17 IU/L (ref 0–40)
Albumin/Globulin Ratio: 1.5 (ref 1.1–2.5)
Albumin: 4.5 g/dL (ref 3.6–4.8)
Alkaline Phosphatase: 83 IU/L (ref 39–117)
BUN/Creatinine Ratio: 12 (ref 10–22)
BUN: 13 mg/dL (ref 8–27)
Bilirubin Total: 0.5 mg/dL (ref 0.0–1.2)
CALCIUM: 9.9 mg/dL (ref 8.6–10.2)
CO2: 24 mmol/L (ref 18–29)
CREATININE: 1.11 mg/dL (ref 0.76–1.27)
Chloride: 101 mmol/L (ref 97–108)
GFR calc Af Amer: 82 mL/min/{1.73_m2} (ref >59)
GFR, EST NON AFRICAN AMERICAN: 71 mL/min/{1.73_m2} (ref >59)
GLOBULIN, TOTAL: 3 g/dL (ref 1.5–4.5)
Glucose: 85 mg/dL (ref 65–99)
Potassium: 4.7 mmol/L (ref 3.5–5.2)
SODIUM: 138 mmol/L (ref 134–144)
TOTAL PROTEIN: 7.5 g/dL (ref 6.0–8.5)

## 2015-07-15 ENCOUNTER — Telehealth: Payer: Self-pay | Admitting: Family Medicine

## 2015-07-15 DIAGNOSIS — E119 Type 2 diabetes mellitus without complications: Secondary | ICD-10-CM

## 2015-07-15 DIAGNOSIS — E7800 Pure hypercholesterolemia, unspecified: Secondary | ICD-10-CM

## 2015-07-15 NOTE — Telephone Encounter (Signed)
Pt states his new pharmacy is [facility] He needs refills on Glypizide, Simvastatin.

## 2015-07-16 MED ORDER — GLIPIZIDE 5 MG PO TABS: 5.0000 mg | ORAL_TABLET | Freq: Two times a day (BID) | ORAL | Status: DC

## 2015-07-16 MED ORDER — SIMVASTATIN 40 MG PO TABS: 40.0000 mg | ORAL_TABLET | Freq: Every day | ORAL | Status: DC

## 2015-07-16 NOTE — Telephone Encounter (Signed)
Medication has been refilled and sent to [facility]

## 2015-09-08 HISTORY — PX: KIDNEY STONE SURGERY: SHX686

## 2015-09-12 ENCOUNTER — Ambulatory Visit: Payer: Managed Care, Other (non HMO) | Admitting: Family Medicine

## 2015-10-01 ENCOUNTER — Ambulatory Visit (INDEPENDENT_AMBULATORY_CARE_PROVIDER_SITE_OTHER): Payer: Managed Care, Other (non HMO) | Admitting: Family Medicine

## 2015-10-01 ENCOUNTER — Encounter: Payer: Self-pay | Admitting: Family Medicine

## 2015-10-01 VITALS — BP 122/72 | HR 79 | Temp 98.4°F | Resp 18 | Ht 65.0 in | Wt 159.4 lb

## 2015-10-01 DIAGNOSIS — E785 Hyperlipidemia, unspecified: Secondary | ICD-10-CM

## 2015-10-01 DIAGNOSIS — E119 Type 2 diabetes mellitus without complications: Secondary | ICD-10-CM

## 2015-10-01 LAB — GLUCOSE, POCT (MANUAL RESULT ENTRY): POC GLUCOSE: 125 mg/dL — AB (ref 70–99)

## 2015-10-01 LAB — POCT GLYCOSYLATED HEMOGLOBIN (HGB A1C): HEMOGLOBIN A1C: 9.1

## 2015-10-01 MED ORDER — METFORMIN HCL 1000 MG PO TABS: 500.0000 mg | ORAL_TABLET | Freq: Two times a day (BID) | ORAL | Status: DC

## 2015-10-01 MED ORDER — GLIPIZIDE 5 MG PO TABS: 5.0000 mg | ORAL_TABLET | Freq: Two times a day (BID) | ORAL | Status: DC

## 2015-10-01 NOTE — Progress Notes (Signed)
Name: Melvin Willis   MRN: [id number]    DOB: 20-Jan-1951   Date:10/01/2015       Progress Note  Subjective  Chief Complaint  Chief Complaint  Patient presents with  . Diabetes    pt here for 3 month follow up  . Hyperlipidemia    Diabetes He presents for his follow-up diabetic visit. He has type 2 diabetes mellitus. Pertinent negatives for diabetes include no fatigue, no foot paresthesias, no polydipsia, no polyuria (occasional, not every day) and no weakness. Current diabetic treatment includes oral agent (dual therapy). His breakfast blood glucose range is generally 140-180 mg/dl. An ACE inhibitor/angiotensin II receptor blocker is being taken. Eye exam is not current.  Hyperlipidemia This is a chronic problem. Recent lipid tests were reviewed and are high (Elevated LDL). Pertinent negatives include no leg pain, myalgias or shortness of breath. Current antihyperlipidemic treatment includes statins.     Past Medical History  Diagnosis Date  . Diabetes mellitus without complication ([place])   . Hyperlipidemia   . Hemorrhoids, internal 04/16/2015  . Diverticulosis of sigmoid colon 04/16/2015  . Dyslipidemia 03/18/2015  . Abnormal prostate specific antigen 08/08/2012  . Hypercholesteremia 04/16/2015  . Adiposity 04/16/2015  . Wears dentures     partial upper  . Hypertension     Past Surgical History  Procedure Laterality Date  . Colonoscopy    . Colonoscopy with propofol N/A 05/06/2015    Procedure: COLONOSCOPY WITH PROPOFOL;  Surgeon: Lucilla Lame, MD;  Location: [facility];  Service: Endoscopy;  Laterality: N/A;  ASCENDING COLON POLYPS X 2 TERMINAL ILEUM BIOPSY RANDOM COLON BX. TRANSVERSE COLON POLYP SIGMOID COLON POLYP  . Esophagogastroduodenoscopy (egd) with propofol N/A 05/06/2015    Procedure: ESOPHAGOGASTRODUODENOSCOPY (EGD) WITH PROPOFOL;  Surgeon: Lucilla Lame, MD;  Location: [facility];  Service: Endoscopy;  Laterality: N/A;  GASTRIC BIOPSY X1    Family  History  Problem Relation Age of Onset  . Diabetes Mother   . Diabetes Father   . Diabetes Sister     Social History   Social History  . Marital Status: Married    Spouse Name: N/A  . Number of Children: N/A  . Years of Education: N/A   Occupational History  . Not on file.   Social History Main Topics  . Smoking status: Never Smoker   . Smokeless tobacco: Never Used  . Alcohol Use: No  . Drug Use: No  . Sexual Activity:    Partners: Female   Other Topics Concern  . Not on file   Social History Narrative     Current outpatient prescriptions:  .  aspirin (BAYER ASPIRIN EC LOW DOSE) 81 MG EC tablet, Take 1 tablet by mouth daily., Disp: , Rfl:  .  finasteride (PROSCAR) 5 MG tablet, Take 1 tablet by mouth daily., Disp: , Rfl:  .  glipiZIDE (GLUCOTROL) 5 MG tablet, Take 1 tablet (5 mg total) by mouth 2 (two) times daily before a meal., Disp: 180 tablet, Rfl: 0 .  lisinopril (PRINIVIL,ZESTRIL) 10 MG tablet, Take 1 tablet (10 mg total) by mouth daily., Disp: 90 tablet, Rfl: 0 .  metFORMIN (GLUCOPHAGE) 1000 MG tablet, Take 0.5 tablets (500 mg total) by mouth 2 (two) times daily., Disp: 90 tablet, Rfl: 0 .  simvastatin (ZOCOR) 40 MG tablet, Take 1 tablet (40 mg total) by mouth daily., Disp: 90 tablet, Rfl: 0 .  tamsulosin (FLOMAX) 0.4 MG CAPS capsule, Take 1 capsule by mouth daily., Disp: , Rfl:  No Known Allergies   Review of Systems  Constitutional: Negative for fatigue.  Respiratory: Negative for shortness of breath.   Musculoskeletal: Negative for myalgias.  Neurological: Negative for weakness.  Endo/Heme/Allergies: Negative for polydipsia.    Objective  Filed Vitals:   10/01/15 0847  BP: 122/72  Pulse: 79  Temp: 98.4 F (36.9 C)  Resp: 18  Height: '5\' 5"'$  (1.651 m)  Weight: 159 lb 6 oz (72.292 kg)  SpO2: 98%    Physical Exam  Constitutional: He is oriented to person, place, and time and well-developed, well-nourished, and in no distress.  HENT:  Head:  Normocephalic and atraumatic.  Cardiovascular: Normal rate and regular rhythm.   Pulmonary/Chest: Effort normal and breath sounds normal.  Abdominal: Soft. Bowel sounds are normal.  Musculoskeletal: He exhibits no edema.  Neurological: He is alert and oriented to person, place, and time.  Nursing note and vitals reviewed.    Recent Results (from the past 2160 hour(s))  POCT Glucose (CBG)     Status: Abnormal   Collection Time: 10/01/15  9:00 AM  Result Value Ref Range   POC Glucose 125 (A) 70 - 99 mg/dl     Assessment & Plan  1. Diabetes mellitus without complication (HCC)  - POCT HgB A1C - POCT Glucose (CBG) - Urine Microalbumin w/creat. ratio - Ambulatory referral to Ophthalmology - glipiZIDE (GLUCOTROL) 5 MG tablet; Take 1 tablet (5 mg total) by mouth 2 (two) times daily before a meal.  Dispense: 180 tablet; Refill: 0 - metFORMIN (GLUCOPHAGE) 1000 MG tablet; Take 0.5 tablets (500 mg total) by mouth 2 (two) times daily.  Dispense: 90 tablet; Refill: 0  2. Dyslipidemia  - Lipid Profile  Melvin Willis [facility] 10/01/2015 9:14 AM

## 2015-10-02 LAB — LIPID PANEL
Chol/HDL Ratio: 3 ratio units (ref 0.0–5.0)
Cholesterol, Total: 155 mg/dL (ref 100–199)
HDL: 51 mg/dL (ref >39)
LDL CALC: 89 mg/dL (ref 0–99)
Triglycerides: 73 mg/dL (ref 0–149)
VLDL CHOLESTEROL CAL: 15 mg/dL (ref 5–40)

## 2015-10-02 LAB — MICROALBUMIN / CREATININE URINE RATIO
Creatinine, Urine: 83 mg/dL
MICROALB/CREAT RATIO: 8.1 mg/g creat (ref 0.0–30.0)
Microalbumin, Urine: 6.7 ug/mL

## 2016-01-01 ENCOUNTER — Ambulatory Visit (INDEPENDENT_AMBULATORY_CARE_PROVIDER_SITE_OTHER): Payer: Managed Care, Other (non HMO) | Admitting: Family Medicine

## 2016-01-01 ENCOUNTER — Encounter: Payer: Self-pay | Admitting: Family Medicine

## 2016-01-01 VITALS — BP 119/76 | HR 75 | Temp 98.8°F | Resp 15 | Ht 65.0 in | Wt 154.8 lb

## 2016-01-01 DIAGNOSIS — R1313 Dysphagia, pharyngeal phase: Secondary | ICD-10-CM | POA: Diagnosis not present

## 2016-01-01 DIAGNOSIS — E119 Type 2 diabetes mellitus without complications: Secondary | ICD-10-CM | POA: Diagnosis not present

## 2016-01-01 DIAGNOSIS — T733XXA Exhaustion due to excessive exertion, initial encounter: Secondary | ICD-10-CM | POA: Insufficient documentation

## 2016-01-01 LAB — POCT GLYCOSYLATED HEMOGLOBIN (HGB A1C): HEMOGLOBIN A1C: 7.2

## 2016-01-01 LAB — GLUCOSE, POCT (MANUAL RESULT ENTRY): POC GLUCOSE: 89 mg/dL (ref 70–99)

## 2016-01-01 NOTE — Progress Notes (Signed)
Name: Melvin Willis   MRN: [id number]    DOB: [birth year]   Date:01/01/2016       Progress Note  Subjective  Chief Complaint  Chief Complaint  Patient presents with  . Follow-up    3 mo  . Diabetes    Diabetes He presents for his follow-up diabetic visit. He has type 2 diabetes mellitus. His disease course has been stable. There are no hypoglycemic associated symptoms. Pertinent negatives for hypoglycemia include no dizziness or headaches. Associated symptoms include fatigue (lately he has been feeling exhausted, thinks its his work) and weight loss. Pertinent negatives for diabetes include no blurred vision, no chest pain, no polydipsia and no polyuria. He is following a diabetic diet. He has not had a previous visit with a dietitian. He rarely participates in exercise. He monitors blood glucose at home 1-2 x per week. An ACE inhibitor/angiotensin II receptor blocker is being taken. Eye exam is current.   Difficulty Swallowing: Pt. Has been experiencing a 'funny' sensation in his throat since he recovered from an influenza-like illness 3-4 weeks ago. He reports eating hard foods such as a meat makes his throat feel funny and it difficult to swallow. No problems with swallowing liquids or purred foods. Reports his throat feels numb and he cannot taste the food in the back of his throat. He may have lost 5 lbs since last visit three months ago. He had an endoscopy last year which showed gastritis and esophagitis.  Fatigue: Pt. Feels fatigue described as being exhausted, usually attributed to working long hours, not getting enough rest. His father passed away last month and he was off for a week but felt even more tired during his time off. He was also sick with an influenza-like illness which likely contributed to his feeling tired.   Past Medical History  Diagnosis Date  . Diabetes mellitus without complication ([place])   . Hyperlipidemia   . Hemorrhoids, internal 04/16/2015  .  Diverticulosis of sigmoid colon 04/16/2015  . Dyslipidemia 03/18/2015  . Abnormal prostate specific antigen 08/08/2012  . Hypercholesteremia 04/16/2015  . Adiposity 04/16/2015  . Wears dentures     partial upper  . Hypertension     Past Surgical History  Procedure Laterality Date  . Colonoscopy    . Colonoscopy with propofol N/A 05/06/2015    Procedure: COLONOSCOPY WITH PROPOFOL;  Surgeon: Lucilla Lame, MD;  Location: [facility];  Service: Endoscopy;  Laterality: N/A;  ASCENDING COLON POLYPS X 2 TERMINAL ILEUM BIOPSY RANDOM COLON BX. TRANSVERSE COLON POLYP SIGMOID COLON POLYP  . Esophagogastroduodenoscopy (egd) with propofol N/A 05/06/2015    Procedure: ESOPHAGOGASTRODUODENOSCOPY (EGD) WITH PROPOFOL;  Surgeon: Lucilla Lame, MD;  Location: [facility];  Service: Endoscopy;  Laterality: N/A;  GASTRIC BIOPSY X1    Family History  Problem Relation Age of Onset  . Diabetes Mother   . Diabetes Father   . Diabetes Sister     Social History   Social History  . Marital Status: Married    Spouse Name: N/A  . Number of Children: N/A  . Years of Education: N/A   Occupational History  . Not on file.   Social History Main Topics  . Smoking status: Never Smoker   . Smokeless tobacco: Never Used  . Alcohol Use: No  . Drug Use: No  . Sexual Activity:    Partners: Female   Other Topics Concern  . Not on file   Social History Narrative     Current outpatient  prescriptions:  .  aspirin (BAYER ASPIRIN EC LOW DOSE) 81 MG EC tablet, Take 1 tablet by mouth daily., Disp: , Rfl:  .  finasteride (PROSCAR) 5 MG tablet, Take 1 tablet by mouth daily., Disp: , Rfl:  .  glipiZIDE (GLUCOTROL) 5 MG tablet, Take 1 tablet (5 mg total) by mouth 2 (two) times daily before a meal., Disp: 180 tablet, Rfl: 0 .  lisinopril (PRINIVIL,ZESTRIL) 10 MG tablet, Take 1 tablet (10 mg total) by mouth daily., Disp: 90 tablet, Rfl: 0 .  metFORMIN (GLUCOPHAGE) 1000 MG tablet, Take 0.5 tablets (500 mg  total) by mouth 2 (two) times daily., Disp: 90 tablet, Rfl: 0 .  simvastatin (ZOCOR) 40 MG tablet, Take 1 tablet (40 mg total) by mouth daily., Disp: 90 tablet, Rfl: 0 .  tamsulosin (FLOMAX) 0.4 MG CAPS capsule, Take 1 capsule by mouth daily., Disp: , Rfl:   No Known Allergies   Review of Systems  Constitutional: Positive for weight loss and fatigue (lately he has been feeling exhausted, thinks its his work).  Eyes: Negative for blurred vision and double vision.  Cardiovascular: Negative for chest pain.  Gastrointestinal: Negative for blood in stool.  Neurological: Negative for dizziness and headaches.  Endo/Heme/Allergies: Negative for polydipsia.    Objective  Filed Vitals:   01/01/16 0923  BP: 119/76  Pulse: 75  Temp: 98.8 F (37.1 C)  TempSrc: Oral  Resp: 15  Height: '5\' 5"'$  (1.651 m)  Weight: 154 lb 12.8 oz (70.217 kg)  SpO2: 98%    Physical Exam  Constitutional: He is oriented to person, place, and time and well-developed, well-nourished, and in no distress.  HENT:  Head: Normocephalic and atraumatic.  Mouth/Throat: Uvula is midline and oropharynx is clear and moist. No posterior oropharyngeal erythema (mild pharyngeal erythema with some mucus visible).  Cardiovascular: Normal rate and regular rhythm.   Pulmonary/Chest: Effort normal and breath sounds normal.  Abdominal: Soft. Bowel sounds are normal.  Neurological: He is alert and oriented to person, place, and time.  Nursing note and vitals reviewed.    Assessment & Plan  1. Diabetes mellitus without complication ([facility]) Diabetes is very well controlled, A1c 7.2%. No change in pharmacotherapy at this time. - POCT HgB A1C - POCT Glucose (CBG)  2. Pharyngeal dysphagia Referral to ENT for further assessment possibe laryngoscopy. - Ambulatory referral to ENT  3. Fatigue due to excessive exertion, initial encounter Likely not getting enough rest, rule out other possible etiologies. - CBC with Differential -  TSH - Vitamin D (25 hydroxy) - B12 - Comprehensive Metabolic Panel (CMET)    Quinita Kostelecky Asad A. [facility] 01/01/2016 9:28 AM

## 2016-01-02 LAB — CBC WITH DIFFERENTIAL/PLATELET
BASOS ABS: 0 10*3/uL (ref 0.0–0.2)
BASOS: 0 %
EOS (ABSOLUTE): 0.3 10*3/uL (ref 0.0–0.4)
EOS: 5 %
HEMATOCRIT: 42.7 % (ref 37.5–51.0)
Hemoglobin: 14.1 g/dL (ref 12.6–17.7)
IMMATURE GRANS (ABS): 0 10*3/uL (ref 0.0–0.1)
Immature Granulocytes: 0 %
LYMPHS ABS: 1.6 10*3/uL (ref 0.7–3.1)
LYMPHS: 23 %
MCH: 28.3 pg (ref 26.6–33.0)
MCHC: 33 g/dL (ref 31.5–35.7)
MCV: 86 fL (ref 79–97)
MONOCYTES: 6 %
Monocytes Absolute: 0.4 10*3/uL (ref 0.1–0.9)
NEUTROS ABS: 4.6 10*3/uL (ref 1.4–7.0)
Neutrophils: 66 %
Platelets: 307 10*3/uL (ref 150–379)
RBC: 4.98 x10E6/uL (ref 4.14–5.80)
RDW: 14.8 % (ref 12.3–15.4)
WBC: 7 10*3/uL (ref 3.4–10.8)

## 2016-01-02 LAB — COMPREHENSIVE METABOLIC PANEL
ALBUMIN: 4.4 g/dL (ref 3.6–4.8)
ALK PHOS: 80 IU/L (ref 39–117)
ALT: 24 IU/L (ref 0–44)
AST: 14 IU/L (ref 0–40)
Albumin/Globulin Ratio: 1.6 (ref 1.2–2.2)
BUN / CREAT RATIO: 12 (ref 10–24)
BUN: 13 mg/dL (ref 8–27)
Bilirubin Total: 0.4 mg/dL (ref 0.0–1.2)
CALCIUM: 10.1 mg/dL (ref 8.6–10.2)
CO2: 21 mmol/L (ref 18–29)
CREATININE: 1.11 mg/dL (ref 0.76–1.27)
Chloride: 102 mmol/L (ref 96–106)
GFR calc Af Amer: 82 mL/min/{1.73_m2} (ref >59)
GFR, EST NON AFRICAN AMERICAN: 71 mL/min/{1.73_m2} (ref >59)
GLOBULIN, TOTAL: 2.8 g/dL (ref 1.5–4.5)
GLUCOSE: 69 mg/dL (ref 65–99)
Potassium: 4.8 mmol/L (ref 3.5–5.2)
Sodium: 139 mmol/L (ref 134–144)
Total Protein: 7.2 g/dL (ref 6.0–8.5)

## 2016-01-02 LAB — TSH: TSH: 0.854 u[IU]/mL (ref 0.450–4.500)

## 2016-01-02 LAB — VITAMIN D 25 HYDROXY (VIT D DEFICIENCY, FRACTURES): Vit D, 25-Hydroxy: 25.1 ng/mL — ABNORMAL LOW (ref 30.0–100.0)

## 2016-01-02 LAB — VITAMIN B12: Vitamin B-12: 475 pg/mL (ref 211–946)

## 2016-01-14 ENCOUNTER — Telehealth: Payer: Self-pay

## 2016-01-14 MED ORDER — VITAMIN D (ERGOCALCIFEROL) 1.25 MG (50000 UNIT) PO CAPS: 50000.0000 [IU] | ORAL_CAPSULE | ORAL | Status: DC

## 2016-01-14 NOTE — Telephone Encounter (Signed)
Lab results have been reported to patient and a prescription for Vitamin D 50,000 units 1 capsule once a week for 12 weeks has been sent to [facility] per Dr. Manuella Ghazi, patient has been notified and verbalized understanding

## 2016-01-20 ENCOUNTER — Other Ambulatory Visit: Payer: Self-pay | Admitting: Family Medicine

## 2016-01-31 ENCOUNTER — Encounter: Payer: Self-pay | Admitting: Family Medicine

## 2016-02-10 ENCOUNTER — Other Ambulatory Visit: Payer: Self-pay | Admitting: Family Medicine

## 2016-03-27 DIAGNOSIS — Z87442 Personal history of urinary calculi: Secondary | ICD-10-CM | POA: Insufficient documentation

## 2016-04-03 ENCOUNTER — Ambulatory Visit (INDEPENDENT_AMBULATORY_CARE_PROVIDER_SITE_OTHER): Payer: Managed Care, Other (non HMO) | Admitting: Family Medicine

## 2016-04-03 ENCOUNTER — Encounter: Payer: Self-pay | Admitting: Family Medicine

## 2016-04-03 VITALS — BP 128/70 | HR 91 | Temp 98.2°F | Resp 16 | Ht 65.0 in | Wt 149.5 lb

## 2016-04-03 DIAGNOSIS — E119 Type 2 diabetes mellitus without complications: Secondary | ICD-10-CM

## 2016-04-03 DIAGNOSIS — Z23 Encounter for immunization: Secondary | ICD-10-CM | POA: Diagnosis not present

## 2016-04-03 DIAGNOSIS — E7800 Pure hypercholesterolemia, unspecified: Secondary | ICD-10-CM | POA: Diagnosis not present

## 2016-04-03 LAB — LIPID PANEL
CHOL/HDL RATIO: 3.3 ratio (ref <=5.0)
Cholesterol: 165 mg/dL (ref 125–200)
HDL: 50 mg/dL (ref >=40)
LDL CALC: 101 mg/dL (ref <130)
Triglycerides: 72 mg/dL (ref <150)
VLDL: 14 mg/dL (ref <30)

## 2016-04-03 LAB — GLUCOSE, POCT (MANUAL RESULT ENTRY): POC GLUCOSE: 203 mg/dL — AB (ref 70–99)

## 2016-04-03 LAB — POCT GLYCOSYLATED HEMOGLOBIN (HGB A1C): Hemoglobin A1C: 7.2

## 2016-04-03 MED ORDER — GLIPIZIDE 5 MG PO TABS: 5.0000 mg | ORAL_TABLET | Freq: Two times a day (BID) | ORAL | 0 refills | Status: DC

## 2016-04-03 MED ORDER — METFORMIN HCL 1000 MG PO TABS
500.0000 mg | ORAL_TABLET | Freq: Two times a day (BID) | ORAL | 0 refills | Status: DC
Start: 2016-04-03 — End: 2016-07-01

## 2016-04-03 MED ORDER — SIMVASTATIN 40 MG PO TABS: 40.0000 mg | ORAL_TABLET | Freq: Every day | ORAL | 0 refills | Status: DC

## 2016-04-03 NOTE — Progress Notes (Signed)
Name: Melvin Willis   MRN: [id number]    DOB: 08-Dec-1950   Date:04/03/2016       Progress Note  Subjective  Chief Complaint  Chief Complaint  Patient presents with  . Diabetes    follow up  . Hyperlipidemia  . Hypertension    Diabetes  He presents for his follow-up diabetic visit. He has type 2 diabetes mellitus. His disease course has been stable. Pertinent negatives for diabetes include no blurred vision, no chest pain, no fatigue, no polydipsia and no polyuria. Pertinent negatives for diabetic complications include no CVA. Current diabetic treatment includes oral agent (dual therapy). His breakfast blood glucose range is generally 140-180 mg/dl. An ACE inhibitor/angiotensin II receptor blocker is being taken.  Hyperlipidemia  This is a chronic problem. The problem is controlled. Recent lipid tests were reviewed and are normal. Pertinent negatives include no chest pain, leg pain, myalgias or shortness of breath. Current antihyperlipidemic treatment includes statins.  Hypertension  This is a chronic problem. The problem is unchanged. Pertinent negatives include no blurred vision, chest pain, palpitations or shortness of breath. Past treatments include ACE inhibitors. There is no history of kidney disease, CAD/MI or CVA.    Past Medical History:  Diagnosis Date  . Abnormal prostate specific antigen 08/08/2012  . Adiposity 04/16/2015  . Diabetes mellitus without complication ([place])   . Diverticulosis of sigmoid colon 04/16/2015  . Dyslipidemia 03/18/2015  . Hemorrhoids, internal 04/16/2015  . Hypercholesteremia 04/16/2015  . Hyperlipidemia   . Hypertension   . Wears dentures    partial upper    Past Surgical History:  Procedure Laterality Date  . COLONOSCOPY    . COLONOSCOPY WITH PROPOFOL N/A 05/06/2015   Procedure: COLONOSCOPY WITH PROPOFOL;  Surgeon: Lucilla Lame, MD;  Location: [facility];  Service: Endoscopy;  Laterality: N/A;  ASCENDING COLON POLYPS X 2 TERMINAL ILEUM  BIOPSY RANDOM COLON BX. TRANSVERSE COLON POLYP SIGMOID COLON POLYP  . ESOPHAGOGASTRODUODENOSCOPY (EGD) WITH PROPOFOL N/A 05/06/2015   Procedure: ESOPHAGOGASTRODUODENOSCOPY (EGD) WITH PROPOFOL;  Surgeon: Lucilla Lame, MD;  Location: [facility];  Service: Endoscopy;  Laterality: N/A;  GASTRIC BIOPSY X1    Family History  Problem Relation Age of Onset  . Diabetes Mother   . Diabetes Father   . Diabetes Sister     Social History   Social History  . Marital status: Married    Spouse name: N/A  . Number of children: N/A  . Years of education: N/A   Occupational History  . Not on file.   Social History Main Topics  . Smoking status: Never Smoker  . Smokeless tobacco: Never Used  . Alcohol use No  . Drug use: No  . Sexual activity: Yes    Partners: Female   Other Topics Concern  . Not on file   Social History Narrative  . No narrative on file     Current Outpatient Prescriptions:  .  aspirin (BAYER ASPIRIN EC LOW DOSE) 81 MG EC tablet, Take 1 tablet by mouth daily., Disp: , Rfl:  .  finasteride (PROSCAR) 5 MG tablet, Take 1 tablet by mouth daily., Disp: , Rfl:  .  glipiZIDE (GLUCOTROL) 5 MG tablet, Take 1 tablet (5 mg total) by mouth 2 (two) times daily before a meal., Disp: 180 tablet, Rfl: 0 .  lisinopril (PRINIVIL,ZESTRIL) 10 MG tablet, TAKE ONE TABLET BY MOUTH ONCE DAILY, Disp: 90 tablet, Rfl: 0 .  metFORMIN (GLUCOPHAGE) 1000 MG tablet, TAKE ONE-HALF TABLET BY MOUTH TWICE DAILY,  Disp: 90 tablet, Rfl: 0 .  simvastatin (ZOCOR) 40 MG tablet, Take 1 tablet (40 mg total) by mouth daily., Disp: 90 tablet, Rfl: 0 .  tamsulosin (FLOMAX) 0.4 MG CAPS capsule, Take 1 capsule by mouth daily., Disp: , Rfl:  .  Vitamin D, Ergocalciferol, (DRISDOL) 50000 units CAPS capsule, Take 1 capsule (50,000 Units total) by mouth once a week. For 12 weeks, Disp: 12 capsule, Rfl: 0  No Known Allergies   Review of Systems  Constitutional: Negative for fatigue.  Eyes: Negative for  blurred vision.  Respiratory: Negative for shortness of breath.   Cardiovascular: Negative for chest pain and palpitations.  Musculoskeletal: Negative for myalgias.  Endo/Heme/Allergies: Negative for polydipsia.    Objective  Vitals:   04/03/16 0840  BP: 128/70  Pulse: 91  Resp: 16  Temp: 98.2 F (36.8 C)  TempSrc: Oral  SpO2: 96%  Weight: 149 lb 8 oz (67.8 kg)  Height: '5\' 5"'$  (1.651 m)    Physical Exam  Constitutional: He is oriented to person, place, and time and well-developed, well-nourished, and in no distress.  HENT:  Head: Normocephalic and atraumatic.  Cardiovascular: Normal rate and regular rhythm.   No murmur heard. Pulmonary/Chest: Effort normal and breath sounds normal. He has no wheezes.  Abdominal: Soft. Bowel sounds are normal. There is no tenderness.  Musculoskeletal: He exhibits no edema.  Neurological: He is alert and oriented to person, place, and time.  Psychiatric: Mood, memory, affect and judgment normal.  Nursing note and vitals reviewed.     Recent Results (from the past 2160 hour(s))  POCT HgB A1C     Status: None   Collection Time: 04/03/16  8:44 AM  Result Value Ref Range   Hemoglobin A1C 7.2   POCT Glucose (CBG)     Status: Abnormal   Collection Time: 04/03/16  8:45 AM  Result Value Ref Range   POC Glucose 203 (A) 70 - 99 mg/dl     Assessment & Plan  1. Type 2 diabetes mellitus without complication, without long-term current use of insulin (HCC) A1c 7.2%, well-controlled diabetes - POCT HgB A1C - POCT Glucose (CBG) - metFORMIN (GLUCOPHAGE) 1000 MG tablet; Take 0.5 tablets (500 mg total) by mouth 2 (two) times daily.  Dispense: 90 tablet; Refill: 0 - glipiZIDE (GLUCOTROL) 5 MG tablet; Take 1 tablet (5 mg total) by mouth 2 (two) times daily before a meal.  Dispense: 180 tablet; Refill: 0  2. Hypercholesteremia  - Lipid Profile - simvastatin (ZOCOR) 40 MG tablet; Take 1 tablet (40 mg total) by mouth daily.  Dispense: 90 tablet;  Refill: 0  3. Need for zoster vaccination  - Varicella-zoster vaccine subcutaneous   Melvin Willis [facility] 04/03/2016 8:51 AM

## 2016-06-03 ENCOUNTER — Inpatient Hospital Stay
Admission: EM | Admit: 2016-06-03 | Discharge: 2016-06-06 | DRG: 988 | Disposition: A | Discharge status: Home / Self Care | Payer: Managed Care, Other (non HMO) | Attending: Internal Medicine | Admitting: Internal Medicine

## 2016-06-03 ENCOUNTER — Ambulatory Visit (INDEPENDENT_AMBULATORY_CARE_PROVIDER_SITE_OTHER): Payer: Managed Care, Other (non HMO) | Admitting: Family Medicine

## 2016-06-03 ENCOUNTER — Emergency Department: Payer: Managed Care, Other (non HMO)

## 2016-06-03 ENCOUNTER — Encounter: Payer: Self-pay | Admitting: Emergency Medicine

## 2016-06-03 ENCOUNTER — Encounter: Payer: Self-pay | Admitting: Family Medicine

## 2016-06-03 ENCOUNTER — Ambulatory Visit
Admission: RE | Admit: 2016-06-03 | Discharge: 2016-06-03 | Disposition: A | Discharge status: Home / Self Care | Payer: Managed Care, Other (non HMO) | Source: Ambulatory Visit | Attending: Family Medicine | Admitting: Family Medicine

## 2016-06-03 ENCOUNTER — Other Ambulatory Visit: Payer: Self-pay

## 2016-06-03 ENCOUNTER — Other Ambulatory Visit
Admission: RE | Admit: 2016-06-03 | Discharge: 2016-06-03 | Disposition: A | Discharge status: Home / Self Care | Payer: Managed Care, Other (non HMO) | Source: Ambulatory Visit | Attending: Family Medicine | Admitting: Family Medicine

## 2016-06-03 DIAGNOSIS — C801 Malignant (primary) neoplasm, unspecified: Secondary | ICD-10-CM

## 2016-06-03 DIAGNOSIS — J9859 Other diseases of mediastinum, not elsewhere classified: Secondary | ICD-10-CM

## 2016-06-03 DIAGNOSIS — K769 Liver disease, unspecified: Secondary | ICD-10-CM | POA: Diagnosis present

## 2016-06-03 DIAGNOSIS — R079 Chest pain, unspecified: Secondary | ICD-10-CM | POA: Insufficient documentation

## 2016-06-03 DIAGNOSIS — E785 Hyperlipidemia, unspecified: Secondary | ICD-10-CM | POA: Diagnosis present

## 2016-06-03 DIAGNOSIS — I2699 Other pulmonary embolism without acute cor pulmonale: Secondary | ICD-10-CM | POA: Diagnosis present

## 2016-06-03 DIAGNOSIS — C3490 Malignant neoplasm of unspecified part of unspecified bronchus or lung: Secondary | ICD-10-CM

## 2016-06-03 DIAGNOSIS — Z79899 Other long term (current) drug therapy: Secondary | ICD-10-CM

## 2016-06-03 DIAGNOSIS — R0600 Dyspnea, unspecified: Secondary | ICD-10-CM | POA: Insufficient documentation

## 2016-06-03 DIAGNOSIS — Z7982 Long term (current) use of aspirin: Secondary | ICD-10-CM

## 2016-06-03 DIAGNOSIS — I82402 Acute embolism and thrombosis of unspecified deep veins of left lower extremity: Secondary | ICD-10-CM

## 2016-06-03 DIAGNOSIS — I10 Essential (primary) hypertension: Secondary | ICD-10-CM | POA: Diagnosis present

## 2016-06-03 DIAGNOSIS — J9811 Atelectasis: Secondary | ICD-10-CM | POA: Diagnosis present

## 2016-06-03 DIAGNOSIS — Z86010 Personal history of colonic polyps: Secondary | ICD-10-CM | POA: Diagnosis not present

## 2016-06-03 DIAGNOSIS — R590 Localized enlarged lymph nodes: Secondary | ICD-10-CM | POA: Diagnosis not present

## 2016-06-03 DIAGNOSIS — R918 Other nonspecific abnormal finding of lung field: Secondary | ICD-10-CM | POA: Diagnosis present

## 2016-06-03 DIAGNOSIS — Z7984 Long term (current) use of oral hypoglycemic drugs: Secondary | ICD-10-CM | POA: Diagnosis not present

## 2016-06-03 DIAGNOSIS — Z8546 Personal history of malignant neoplasm of prostate: Secondary | ICD-10-CM

## 2016-06-03 DIAGNOSIS — R0602 Shortness of breath: Secondary | ICD-10-CM | POA: Diagnosis present

## 2016-06-03 DIAGNOSIS — R222 Localized swelling, mass and lump, trunk: Secondary | ICD-10-CM

## 2016-06-03 DIAGNOSIS — Z23 Encounter for immunization: Secondary | ICD-10-CM

## 2016-06-03 LAB — PROTIME-INR
INR: 1.08
PROTHROMBIN TIME: 14 s (ref 11.4–15.2)

## 2016-06-03 LAB — COMPREHENSIVE METABOLIC PANEL
ALT: 18 U/L (ref 17–63)
ALT: 19 U/L (ref 17–63)
ANION GAP: 8 (ref 5–15)
AST: 17 U/L (ref 15–41)
AST: 19 U/L (ref 15–41)
Albumin: 3.9 g/dL (ref 3.5–5.0)
Albumin: 4.2 g/dL (ref 3.5–5.0)
Alkaline Phosphatase: 88 U/L (ref 38–126)
Alkaline Phosphatase: 92 U/L (ref 38–126)
Anion gap: 8 (ref 5–15)
BILIRUBIN TOTAL: 0.8 mg/dL (ref 0.3–1.2)
BILIRUBIN TOTAL: 0.9 mg/dL (ref 0.3–1.2)
BUN: 15 mg/dL (ref 6–20)
BUN: 13 mg/dL (ref 6–20)
CO2: 23 mmol/L (ref 22–32)
CO2: 25 mmol/L (ref 22–32)
CREATININE: 1.18 mg/dL (ref 0.61–1.24)
Calcium: 9.7 mg/dL (ref 8.9–10.3)
Calcium: 9.6 mg/dL (ref 8.9–10.3)
Chloride: 106 mmol/L (ref 101–111)
Chloride: 102 mmol/L (ref 101–111)
Creatinine, Ser: 1.23 mg/dL (ref 0.61–1.24)
GFR calc Af Amer: >60 mL/min (ref >60)
Glucose, Bld: 105 mg/dL — ABNORMAL HIGH (ref 65–99)
Glucose, Bld: 126 mg/dL — ABNORMAL HIGH (ref 65–99)
POTASSIUM: 4.5 mmol/L (ref 3.5–5.1)
POTASSIUM: 4.2 mmol/L (ref 3.5–5.1)
Sodium: 137 mmol/L (ref 135–145)
Sodium: 135 mmol/L (ref 135–145)
TOTAL PROTEIN: 7.8 g/dL (ref 6.5–8.1)
TOTAL PROTEIN: 8.1 g/dL (ref 6.5–8.1)

## 2016-06-03 LAB — GLUCOSE, CAPILLARY: GLUCOSE-CAPILLARY: 228 mg/dL — AB (ref 65–99)

## 2016-06-03 LAB — CBC
HEMATOCRIT: 38 % — AB (ref 40.0–52.0)
Hemoglobin: 12.6 g/dL — ABNORMAL LOW (ref 13.0–18.0)
MCH: 27.5 pg (ref 26.0–34.0)
MCHC: 33.1 g/dL (ref 32.0–36.0)
MCV: 83.2 fL (ref 80.0–100.0)
Platelets: 300 10*3/uL (ref 150–440)
RBC: 4.57 MIL/uL (ref 4.40–5.90)
RDW: 14.1 % (ref 11.5–14.5)
WBC: 6.7 10*3/uL (ref 3.8–10.6)

## 2016-06-03 LAB — APTT: aPTT: 26 seconds (ref 24–36)

## 2016-06-03 MED ORDER — TAMSULOSIN HCL 0.4 MG PO CAPS
0.4000 mg | ORAL_CAPSULE | ORAL | Status: DC
  Administered 2016-06-04 – 2016-06-06 (×2): 0.4 mg via ORAL
  Filled 2016-06-03 (×3): qty 1

## 2016-06-03 MED ORDER — GLIPIZIDE 5 MG PO TABS
5.0000 mg | ORAL_TABLET | Freq: Two times a day (BID) | ORAL | Status: DC
  Administered 2016-06-04 – 2016-06-06 (×3): 5 mg via ORAL
  Filled 2016-06-03 (×4): qty 1

## 2016-06-03 MED ORDER — HEPARIN (PORCINE) IN NACL 100-0.45 UNIT/ML-% IJ SOLN
1100.0000 [IU]/h | INTRAMUSCULAR | Status: DC
  Administered 2016-06-03 – 2016-06-04 (×2): 1100 [IU]/h via INTRAVENOUS
  Filled 2016-06-03 (×5): qty 250

## 2016-06-03 MED ORDER — HEPARIN BOLUS VIA INFUSION
4000.0000 [IU] | Freq: Once | INTRAVENOUS | Status: AC
  Administered 2016-06-03: 4000 [IU] via INTRAVENOUS
  Filled 2016-06-03: qty 4000

## 2016-06-03 MED ORDER — IOPAMIDOL (ISOVUE-370) INJECTION 76%
100.0000 mL | Freq: Once | INTRAVENOUS | Status: AC | PRN
  Administered 2016-06-03: 100 mL via INTRAVENOUS

## 2016-06-03 MED ORDER — INSULIN ASPART 100 UNIT/ML ~~LOC~~ SOLN
0.0000 [IU] | Freq: Three times a day (TID) | SUBCUTANEOUS | Status: DC
  Administered 2016-06-04 (×2): 2 [IU] via SUBCUTANEOUS
  Administered 2016-06-05: 3 [IU] via SUBCUTANEOUS
  Filled 2016-06-03: qty 2
  Filled 2016-06-03: qty 3
  Filled 2016-06-03: qty 2

## 2016-06-03 MED ORDER — SIMVASTATIN 40 MG PO TABS
40.0000 mg | ORAL_TABLET | Freq: Every day | ORAL | Status: DC
  Administered 2016-06-03: 40 mg via ORAL
  Filled 2016-06-03 (×2): qty 1

## 2016-06-03 MED ORDER — SODIUM CHLORIDE 0.9% FLUSH
3.0000 mL | Freq: Two times a day (BID) | INTRAVENOUS | Status: DC
  Administered 2016-06-03 – 2016-06-06 (×6): 3 mL via INTRAVENOUS

## 2016-06-03 MED ORDER — LISINOPRIL 10 MG PO TABS
10.0000 mg | ORAL_TABLET | Freq: Every day | ORAL | Status: DC
  Administered 2016-06-03: 23:00:00 10 mg via ORAL
  Filled 2016-06-03 (×2): qty 1

## 2016-06-03 MED ORDER — INFLUENZA VAC SPLIT QUAD 0.5 ML IM SUSY
0.5000 mL | PREFILLED_SYRINGE | INTRAMUSCULAR | Status: AC
  Administered 2016-06-04: 0.5 mL via INTRAMUSCULAR
  Filled 2016-06-03: qty 0.5

## 2016-06-03 MED ORDER — FINASTERIDE 5 MG PO TABS
5.0000 mg | ORAL_TABLET | Freq: Every day | ORAL | Status: DC
  Administered 2016-06-03 – 2016-06-05 (×3): 5 mg via ORAL
  Filled 2016-06-03 (×3): qty 1

## 2016-06-03 MED ORDER — METFORMIN HCL 500 MG PO TABS
500.0000 mg | ORAL_TABLET | Freq: Two times a day (BID) | ORAL | Status: DC
  Administered 2016-06-04 – 2016-06-06 (×3): 500 mg via ORAL
  Filled 2016-06-03 (×4): qty 1

## 2016-06-03 NOTE — ED Notes (Signed)
Inpatient MD at bedside

## 2016-06-03 NOTE — ED Triage Notes (Signed)
[facility] since Ct scan showed Pe. Pt states he has been short of breath for a few days now.

## 2016-06-03 NOTE — H&P (Signed)
[facility] NAME: Melvin Willis    MR#:  [id number]  DATE OF BIRTH:  1950-12-02  DATE OF ADMISSION:  06/03/2016  PRIMARY CARE PHYSICIAN: Keith Rake, MD   REQUESTING/REFERRING PHYSICIAN: [email or web address]  CHIEF COMPLAINT:   Chief Complaint  Patient presents with  . Shortness of Breath    HISTORY OF PRESENT ILLNESS: Melvin Willis  is a 65 y.o. male with a known history of Dm, HTn, Hlp, BPH, recent prostate surgery 3 weeks ago, Had c/o chest pain and some SOB for last 3-4 days- went to PMD and CT chest was done- showed PE and a lung mass- so PMD called to go to ER. Also noted to have some mediastinal LN and liver lesion on same CT. DVT studies positive in ER.  PAST MEDICAL HISTORY:   Past Medical History:  Diagnosis Date  . Abnormal prostate specific antigen 08/08/2012  . Adiposity 04/16/2015  . Diabetes mellitus without complication ([place])   . Diverticulosis of sigmoid colon 04/16/2015  . Dyslipidemia 03/18/2015  . Hemorrhoids, internal 04/16/2015  . Hypercholesteremia 04/16/2015  . Hyperlipidemia   . Hypertension   . Wears dentures    partial upper    PAST SURGICAL HISTORY: Past Surgical History:  Procedure Laterality Date  . COLONOSCOPY    . COLONOSCOPY WITH PROPOFOL N/A 05/06/2015   Procedure: COLONOSCOPY WITH PROPOFOL;  Surgeon: Lucilla Lame, MD;  Location: [facility];  Service: Endoscopy;  Laterality: N/A;  ASCENDING COLON POLYPS X 2 TERMINAL ILEUM BIOPSY RANDOM COLON BX. TRANSVERSE COLON POLYP SIGMOID COLON POLYP  . ESOPHAGOGASTRODUODENOSCOPY (EGD) WITH PROPOFOL N/A 05/06/2015   Procedure: ESOPHAGOGASTRODUODENOSCOPY (EGD) WITH PROPOFOL;  Surgeon: Lucilla Lame, MD;  Location: [facility] [place];  Service: Endoscopy;  Laterality: N/A;  GASTRIC BIOPSY X1    SOCIAL HISTORY:  Social History  Substance Use Topics  . Smoking status: Never Smoker  . Smokeless tobacco: Never Used  . Alcohol use No    FAMILY HISTORY:   Family History  Problem Relation Age of Onset  . Diabetes Mother   . Diabetes Father   . CAD Father   . Diabetes Sister     DRUG ALLERGIES: No Known Allergies  REVIEW OF SYSTEMS:   CONSTITUTIONAL: No fever, fatigue or weakness.  EYES: No blurred or double vision.  EARS, NOSE, AND THROAT: No tinnitus or ear pain.  RESPIRATORY: No cough, shortness of breath, wheezing or hemoptysis.  CARDIOVASCULAR: some chest pain,no orthopnea, edema.  GASTROINTESTINAL: No nausea, vomiting, diarrhea or abdominal pain.  GENITOURINARY: No dysuria, hematuria.  ENDOCRINE: No polyuria, nocturia,  HEMATOLOGY: No anemia, easy bruising or bleeding SKIN: No rash or lesion. MUSCULOSKELETAL: No joint pain or arthritis.   NEUROLOGIC: No tingling, numbness, weakness.  PSYCHIATRY: No anxiety or depression.   MEDICATIONS AT HOME:  Prior to Admission medications   Medication Sig Start Date End Date Taking? Authorizing Provider  aspirin (BAYER ASPIRIN EC LOW DOSE) 81 MG EC tablet Take 81 mg by mouth every morning.    Yes Historical Provider, MD  finasteride (PROSCAR) 5 MG tablet Take 5 mg by mouth at bedtime.  03/04/15  Yes Historical Provider, MD  glipiZIDE (GLUCOTROL) 5 MG tablet Take 1 tablet (5 mg total) by mouth 2 (two) times daily before a meal. 04/03/16  Yes Roselee Nova, MD  lisinopril (PRINIVIL,ZESTRIL) 10 MG tablet TAKE ONE TABLET BY MOUTH ONCE DAILY 01/20/16  Yes Roselee Nova, MD  metFORMIN (GLUCOPHAGE) 1000 MG tablet Take  0.5 tablets (500 mg total) by mouth 2 (two) times daily. 04/03/16  Yes Roselee Nova, MD  simvastatin (ZOCOR) 40 MG tablet Take 1 tablet (40 mg total) by mouth daily. Patient taking differently: Take 40 mg by mouth at bedtime.  04/03/16  Yes Roselee Nova, MD  tamsulosin (FLOMAX) 0.4 MG CAPS capsule Take 0.4 mg by mouth every morning.  01/24/15  Yes Historical Provider, MD      PHYSICAL EXAMINATION:   VITAL SIGNS: Blood pressure (!) 142/86, pulse 91, temperature 97.8 F  (36.6 C), temperature source Oral, resp. rate 15, height '5\' 5"'$  (1.651 m), weight 65.8 kg (145 lb), SpO2 99 %.  GENERAL:  64 y.o.-year-old patient lying in the bed with no acute distress.  EYES: Pupils equal, round, reactive to light and accommodation. No scleral icterus. Extraocular muscles intact.  HEENT: Head atraumatic, normocephalic. Oropharynx and nasopharynx clear.  NECK:  Supple, no jugular venous distention. No thyroid enlargement, no tenderness.  LUNGS: Normal breath sounds bilaterally, no wheezing, rales,rhonchi or crepitation. No use of accessory muscles of respiration.  CARDIOVASCULAR: S1, S2 normal. No murmurs, rubs, or gallops.  ABDOMEN: Soft, nontender, nondistended. Bowel sounds present. No organomegaly or mass.  EXTREMITIES: No pedal edema, cyanosis, or clubbing.  NEUROLOGIC: Cranial nerves II through XII are intact. Muscle strength 5/5 in all extremities. Sensation intact. Gait not checked.  PSYCHIATRIC: The patient is alert and oriented x 3.  SKIN: No obvious rash, lesion, or ulcer.   LABORATORY PANEL:   CBC  Recent Labs Lab 06/03/16 1630  WBC 6.7  HGB 12.6*  HCT 38.0*  PLT 300  MCV 83.2  MCH 27.5  MCHC 33.1  RDW 14.1   ------------------------------------------------------------------------------------------------------------------  Chemistries   Recent Labs Lab 06/03/16 1133 06/03/16 1630  NA 137 135  K 4.5 4.2  CL 106 102  CO2 23 25  GLUCOSE 105* 126*  BUN 15 13  CREATININE 1.18 1.23  CALCIUM 9.7 9.6  AST 17 19  ALT 18 19  ALKPHOS 88 92  BILITOT 0.8 0.9   ------------------------------------------------------------------------------------------------------------------ estimated creatinine clearance is 54.2 mL/min (by C-G formula based on SCr of 1.23 mg/dL). ------------------------------------------------------------------------------------------------------------------ No results for input(s): TSH, T4TOTAL, T3FREE, THYROIDAB in the last 72  hours.  Invalid input(s): FREET3   Coagulation profile  Recent Labs Lab 06/03/16 1630  INR 1.08   ------------------------------------------------------------------------------------------------------------------- No results for input(s): DDIMER in the last 72 hours. -------------------------------------------------------------------------------------------------------------------  Cardiac Enzymes No results for input(s): CKMB, TROPONINI, MYOGLOBIN in the last 168 hours.  Invalid input(s): CK ------------------------------------------------------------------------------------------------------------------ Invalid input(s): POCBNP  ---------------------------------------------------------------------------------------------------------------  Urinalysis No results found for: COLORURINE, APPEARANCEUR, LABSPEC, PHURINE, GLUCOSEU, HGBUR, BILIRUBINUR, KETONESUR, PROTEINUR, UROBILINOGEN, NITRITE, LEUKOCYTESUR   RADIOLOGY: Ct Angio Chest W/cm &/or Wo Cm  Result Date: 06/03/2016 CLINICAL DATA:  Severe left chest pain radiating down arm since this morning. Shortness of breath and fatigue since lithotripsy approximately 1 month ago. EXAM: CT ANGIOGRAPHY CHEST WITH CONTRAST TECHNIQUE: Multidetector CT imaging of the chest was performed using the standard protocol during bolus administration of intravenous contrast. Multiplanar CT image reconstructions and MIPs were obtained to evaluate the vascular anatomy. CONTRAST:  100 cc Isovue 370 COMPARISON:  None. FINDINGS: Cardiovascular: Multiple pulmonary emboli within the central segmental pulmonary arteries to the bilateral lower lobes, right greater than left. Several additional small pulmonary emboli within the central segmental pulmonary artery branches to the right upper lobe. No evidence of associated right heart failure. Heart size is normal. No pericardial effusion. Thoracic aorta is normal  in caliber. No aortic dissection. Mediastinum/Nodes:  Numerous small and enlarged lymph nodes within the mediastinum, largest lymph nodes within the anterior mediastinum. Representative lymph node within the anterior mediastinum measures 1.9 cm short axis dimension (series 5, image 33). Additional conglomerate lymphadenopathy noted within the precarinal space measuring approximately 3.6 x 2.1 cm. Conglomerate subcarinal lymphadenopathy measures approximately 3.2 x 2.4 cm. Additional suspicious lymph nodes are seen in the right upper paratracheal region (series 5, images 14 through 23). Several mildly prominent supraclavicular lymph nodes are seen bilaterally, largest on the right measuring 1.1 cm short axis dimension (series 5, image 10). Esophagus and trachea are unremarkable. Lungs/Pleura: Dense consolidation is seen within the anterior segment of the left upper lobe, favored to represent airspace collapse secondary to a central obstructing mass. The presumed central obstructing mass is not clearly demarcated. The bronchus to the anterior segment of the left upper lobe terminates at the proximal margin of the consolidation. Lungs otherwise clear.  No pleural effusion. Upper Abdomen: Hypodense masses are seen within the bilateral liver lobes, largest of which is in the left liver lobe demonstrating CT density measurements compatible with benign cysts (2 abutting cysts with largest component measuring 3 cm greatest dimension. A smaller lesion in the right liver lobe measures 1.6 x 1.6 cm, too small to definitively characterize. Limited images of the upper abdomen are otherwise unremarkable. Musculoskeletal: No acute or suspicious osseous finding. Superficial soft tissues are unremarkable. Review of the MIP images confirms the above findings. IMPRESSION: 1. Bilateral small pulmonary emboli within the central segmental pulmonary arteries to the right lower lobe, right upper lobe and left lower lobe. No evidence of associated right heart failure. 2. Large dense  consolidation within the anterior segment of the left upper lobe, almost certainly postobstructive airspace collapse related to a central obstructing mass. The presumed central obstructing mass, however, is not clearly demarcated but is likely at the level of the acutely occluded left upper lobe bronchus. There is additional obstruction of the adjacent left upper lobe pulmonary vein. 3. Fairly extensive mediastinal lymphadenopathy, with measurements given above. Additional mildly prominent lymph nodes within the supraclavicular regions are also suspicious for metastatic lymphadenopathy. 4. Hypodense masses within each liver lobe, largest located within the left liver lobe has the appearance of 2 abutting simple cysts based on CT density measurements. The lesion within the right liver lobe is too small to definitively characterize and requires further characterization with MRI to exclude metastasis. Critical Value/emergent results were called by telephone at the time of interpretation on 06/03/2016 at 3:30 pm to Dr. Dossie Der [facility] , who verbally acknowledged these results. Electronically Signed   By: Franki Cabot M.D.   On: 06/03/2016 15:35   US Venous Img Lower Unilateral Left  Result Date: 06/03/2016 CLINICAL DATA:  Subacute onset of left lower extremity pain. Known pulmonary embolus. Initial encounter. EXAM: LEFT LOWER EXTREMITY VENOUS DOPPLER ULTRASOUND TECHNIQUE: Gray-scale sonography with graded compression, as well as color Doppler and duplex ultrasound were performed to evaluate the lower extremity deep venous systems from the level of the common femoral vein and including the common femoral, femoral, profunda femoral, popliteal and calf veins including the posterior tibial, peroneal and gastrocnemius veins when visible. The superficial great saphenous vein was also interrogated. Spectral Doppler was utilized to evaluate flow at rest and with distal augmentation maneuvers in the common femoral, femoral and  popliteal veins. COMPARISON:  None. FINDINGS: Contralateral Common Femoral Vein: Respiratory phasicity is normal and symmetric with the symptomatic side. No  evidence of thrombus. Normal compressibility. Common Femoral Vein: No evidence of thrombus. Normal compressibility, respiratory phasicity and response to augmentation. Saphenofemoral Junction: No evidence of thrombus. Normal compressibility and flow on color Doppler imaging. Profunda Femoral Vein: No evidence of thrombus. Normal compressibility and flow on color Doppler imaging. Femoral Vein: No evidence of thrombus. Normal compressibility, respiratory phasicity and response to augmentation. Popliteal Vein: No evidence of thrombus. Normal compressibility, respiratory phasicity and response to augmentation. Calf Veins: Occlusive thrombus is noted within one of the patient's left posterior tibial veins. The remaining visualized calf veins are unremarkable. Superficial Great Saphenous Vein: No evidence of thrombus. Normal compressibility and flow on color Doppler imaging. Venous Reflux:  None. Other Findings:  None. IMPRESSION: Occlusive deep venous thrombus noted within one of the patient's posterior tibial veins. These results were called by telephone at the time of interpretation on 06/03/2016 at 6:16 pm to Dr. Harvest Dark, who verbally acknowledged these results. Electronically Signed   By: Garald Balding M.D.   On: 06/03/2016 18:19    EKG: Orders placed or performed in visit on 06/03/16  . EKG 12-Lead    IMPRESSION AND PLAN:  * PE and DVT   Heparin drip now.   As he may need Biopsy on mass, no oral agents till then.   No signs of Rt heart strain on CT.  * Lung mass and mediastinal LN   Oncology and pulm consult.   May need bronch, keep NPO in anticipation of bronch tomorrow.  * Htn   Cont home meds  * DM   Cont home meds and on ISS.  * Hyperlipidemia   Cont statin.   All the records are reviewed and case discussed with ED  provider. Management plans discussed with the patient, family and they are in agreement.  CODE STATUS: Full. Code Status History    This patient does not have a recorded code status. Please follow your organizational policy for patients in this situation.     Wife, sister, daughter and mother were present in room.  TOTAL TIME TAKING CARE OF THIS PATIENT: 50 minutes.    Vaughan Basta M.D on 06/03/2016   Between 7am to 6pm - Pager - [phone]  After 6pm go to [email or web address] - password [email or web address] [facility]  Office  [phone]  CC: Primary care physician; Keith Rake, MD   Note: This dictation was prepared with Dragon dictation along with smaller phrase technology. Any transcriptional errors that result from this process are unintentional.

## 2016-06-03 NOTE — Progress Notes (Signed)
Name: Melvin Willis   MRN: [id number]    DOB: 1951-02-22   Date:06/03/2016       Progress Note  Subjective  Chief Complaint  Chief Complaint  Patient presents with  . Chest Pains    pain was radiating down left arm for 4 days  . Shortness of Breath    Chest Pain   This is a new problem. The onset quality is sudden (woke up from sleep and noticed having chest pain). The pain is present in the lateral region (left lateral region.). The pain is at a severity of 3/10. Pain severity now: currently resolved. The quality of the pain is described as dull. The pain radiates to the left arm (left arm tingling). Associated symptoms include a cough and shortness of breath. Pertinent negatives include no dizziness, fever, nausea, palpitations or sputum production. He has tried nothing for the symptoms.  His past medical history is significant for diabetes.   New onset dyspnea in the last week, recently had surgery for removal of bladder stones, presents with one episode of left sided chest pain, intermittent dyspnea, left arm numbness and tingling  Past Medical History:  Diagnosis Date  . Abnormal prostate specific antigen 08/08/2012  . Adiposity 04/16/2015  . Diabetes mellitus without complication ([place])   . Diverticulosis of sigmoid colon 04/16/2015  . Dyslipidemia 03/18/2015  . Hemorrhoids, internal 04/16/2015  . Hypercholesteremia 04/16/2015  . Hyperlipidemia   . Hypertension   . Wears dentures    partial upper    Past Surgical History:  Procedure Laterality Date  . COLONOSCOPY    . COLONOSCOPY WITH PROPOFOL N/A 05/06/2015   Procedure: COLONOSCOPY WITH PROPOFOL;  Surgeon: Lucilla Lame, MD;  Location: [place];  Service: Endoscopy;  Laterality: N/A;  ASCENDING COLON POLYPS X 2 TERMINAL ILEUM BIOPSY RANDOM COLON BX. TRANSVERSE COLON POLYP SIGMOID COLON POLYP  . ESOPHAGOGASTRODUODENOSCOPY (EGD) WITH PROPOFOL N/A 05/06/2015   Procedure: ESOPHAGOGASTRODUODENOSCOPY (EGD) WITH  PROPOFOL;  Surgeon: Lucilla Lame, MD;  Location: [place];  Service: Endoscopy;  Laterality: N/A;  GASTRIC BIOPSY X1    Family History  Problem Relation Age of Onset  . Diabetes Mother   . Diabetes Father   . Diabetes Sister     Social History   Social History  . Marital status: Married    Spouse name: N/A  . Number of children: N/A  . Years of education: N/A   Occupational History  . Not on file.   Social History Main Topics  . Smoking status: Never Smoker  . Smokeless tobacco: Never Used  . Alcohol use No  . Drug use: No  . Sexual activity: Yes    Partners: Female   Other Topics Concern  . Not on file   Social History Narrative  . No narrative on file     Current Outpatient Prescriptions:  .  aspirin (BAYER ASPIRIN EC LOW DOSE) 81 MG EC tablet, Take 1 tablet by mouth daily., Disp: , Rfl:  .  finasteride (PROSCAR) 5 MG tablet, Take 1 tablet by mouth daily., Disp: , Rfl:  .  glipiZIDE (GLUCOTROL) 5 MG tablet, Take 1 tablet (5 mg total) by mouth 2 (two) times daily before a meal., Disp: 180 tablet, Rfl: 0 .  lisinopril (PRINIVIL,ZESTRIL) 10 MG tablet, TAKE ONE TABLET BY MOUTH ONCE DAILY, Disp: 90 tablet, Rfl: 0 .  metFORMIN (GLUCOPHAGE) 1000 MG tablet, Take 0.5 tablets (500 mg total) by mouth 2 (two) times daily., Disp: 90 tablet, Rfl: 0 .  simvastatin (ZOCOR) 40 MG tablet, Take 1 tablet (40 mg total) by mouth daily., Disp: 90 tablet, Rfl: 0 .  tamsulosin (FLOMAX) 0.4 MG CAPS capsule, Take 1 capsule by mouth daily., Disp: , Rfl:   No Known Allergies   Review of Systems  Constitutional: Negative for fever.  Respiratory: Positive for cough and shortness of breath. Negative for sputum production.   Cardiovascular: Positive for chest pain. Negative for palpitations.  Gastrointestinal: Negative for nausea.  Neurological: Negative for dizziness.    Objective  Vitals:   06/03/16 0922  BP: 120/70  Pulse: 99  Resp: 16  Temp: 98.3 F (36.8 C)  TempSrc:  Oral  SpO2: 98%  Weight: 145 lb 1.6 oz (65.8 kg)  Height: '5\' 5"'$  (1.651 m)    Physical Exam  Constitutional: He is well-developed, well-nourished, and in no distress.  Cardiovascular: Normal rate, regular rhythm, S1 normal, S2 normal and normal heart sounds.   No murmur heard. Pulmonary/Chest: Effort normal and breath sounds normal. He has no wheezes. He has no rhonchi.  Musculoskeletal:       Right ankle: He exhibits no swelling.       Left ankle: He exhibits swelling (trace pitting edema, mild tenderness to palpation over the lower leg and around the ankle.).  Psychiatric: Mood, memory, affect and judgment normal.  Nursing note and vitals reviewed.   Assessment & Plan  1. Dyspnea Onset of dyspnea after recent surgery, we'll consider PE versus other etiologies. Patient will be [facility] for CT angiogram - CT Angio Chest W/Cm &/Or Wo Cm; Future  2. Chest pain, unspecified chest pain type EKG shows normal sinus rhythm, will refer to cardiology for consideration of stress testing - Ambulatory referral to Cardiology - EKG 12-Lead - COMPLETE METABOLIC PANEL WITH GFR    Willma Obando [facility] 06/03/2016 9:31 AM

## 2016-06-03 NOTE — ED Provider Notes (Signed)
[facility] Emergency Department Provider Note  Time seen: 4:53 PM  I have reviewed the triage vital signs and the nursing notes.   HISTORY  Chief Complaint Shortness of Breath    HPI Melvin Willis is a 65 y.o. male with a past medical history of diabetes, hypertension, hyperlipidemia, who presents the emergency department for a pulmonary emboli. According to the patient for the past one month he has been having shortness of breath. He is also been experiencing intermittent chest pain for the past 2 days. He called his doctor who ordered an outpatient CT. CT was positive for pulmonary embolus so the patient was sent to the emergency department for evaluation. Patient states mild chest pain currently. Mild shortness of breath. He states he has been having some pains intermittently in his left lower extremity.  Past Medical History:  Diagnosis Date  . Abnormal prostate specific antigen 08/08/2012  . Adiposity 04/16/2015  . Diabetes mellitus without complication ([place])   . Diverticulosis of sigmoid colon 04/16/2015  . Dyslipidemia 03/18/2015  . Hemorrhoids, internal 04/16/2015  . Hypercholesteremia 04/16/2015  . Hyperlipidemia   . Hypertension   . Wears dentures    partial upper    Patient Active Problem List   Diagnosis Date Noted  . Dyspnea 06/03/2016  . Chest pain 06/03/2016  . Pharyngeal dysphagia 01/01/2016  . Fatigue due to excessive exertion 01/01/2016  . Benign neoplasm of ascending colon   . Benign neoplasm of sigmoid colon   . Benign neoplasm of transverse colon   . Diverticulosis of large intestine without diverticulitis   . Acute esophagitis   . Gastritis   . Diverticulosis of sigmoid colon 04/16/2015  . Hypercholesteremia 04/16/2015  . Adiposity 04/16/2015  . Diabetes mellitus without complication ([place]) 81/82/9937  . Dyslipidemia 03/18/2015  . Disorder of male genital organ 08/08/2012  . Abnormal prostate specific antigen 08/08/2012     Past Surgical History:  Procedure Laterality Date  . COLONOSCOPY    . COLONOSCOPY WITH PROPOFOL N/A 05/06/2015   Procedure: COLONOSCOPY WITH PROPOFOL;  Surgeon: Lucilla Lame, MD;  Location: [place];  Service: Endoscopy;  Laterality: N/A;  ASCENDING COLON POLYPS X 2 TERMINAL ILEUM BIOPSY RANDOM COLON BX. TRANSVERSE COLON POLYP SIGMOID COLON POLYP  . ESOPHAGOGASTRODUODENOSCOPY (EGD) WITH PROPOFOL N/A 05/06/2015   Procedure: ESOPHAGOGASTRODUODENOSCOPY (EGD) WITH PROPOFOL;  Surgeon: Lucilla Lame, MD;  Location: [place];  Service: Endoscopy;  Laterality: N/A;  GASTRIC BIOPSY X1    Prior to Admission medications   Medication Sig Start Date End Date Taking? Authorizing Provider  aspirin (BAYER ASPIRIN EC LOW DOSE) 81 MG EC tablet Take 1 tablet by mouth daily.    Historical Provider, MD  finasteride (PROSCAR) 5 MG tablet Take 1 tablet by mouth daily. 03/04/15   Historical Provider, MD  glipiZIDE (GLUCOTROL) 5 MG tablet Take 1 tablet (5 mg total) by mouth 2 (two) times daily before a meal. 04/03/16   Roselee Nova, MD  lisinopril (PRINIVIL,ZESTRIL) 10 MG tablet TAKE ONE TABLET BY MOUTH ONCE DAILY 01/20/16   Roselee Nova, MD  metFORMIN (GLUCOPHAGE) 1000 MG tablet Take 0.5 tablets (500 mg total) by mouth 2 (two) times daily. 04/03/16   Roselee Nova, MD  simvastatin (ZOCOR) 40 MG tablet Take 1 tablet (40 mg total) by mouth daily. 04/03/16   Roselee Nova, MD  tamsulosin (FLOMAX) 0.4 MG CAPS capsule Take 1 capsule by mouth daily. 01/24/15   Historical Provider, MD  No Known Allergies  Family History  Problem Relation Age of Onset  . Diabetes Mother   . Diabetes Father   . Diabetes Sister     Social History Social History  Substance Use Topics  . Smoking status: Never Smoker  . Smokeless tobacco: Never Used  . Alcohol use No    Review of Systems Constitutional: Negative for fever. Cardiovascular: Intermittent chest pain Respiratory: Mild shortness of  breath Gastrointestinal: Negative for abdominal pain Neurological: Negative for headache 10-point ROS otherwise negative.  ____________________________________________   PHYSICAL EXAM:  VITAL SIGNS: ED Triage Vitals  Enc Vitals Group     BP 06/03/16 1605 (!) 146/86     Pulse Rate 06/03/16 1605 89     Resp 06/03/16 1605 18     Temp 06/03/16 1605 97.8 F (36.6 C)     Temp Source 06/03/16 1605 Oral     SpO2 06/03/16 1605 99 %     Weight 06/03/16 1606 145 lb (65.8 kg)     Height 06/03/16 1606 '5\' 5"'$  (1.651 m)     Head Circumference --      Peak Flow --      Pain Score --      Pain Loc --      Pain Edu? --      Excl. in [place]? --     Constitutional: Alert and oriented. Well appearing and in no distress. Eyes: Normal exam ENT   Head: Normocephalic and atraumatic   Mouth/Throat: Mucous membranes are moist. Cardiovascular: Normal rate, regular rhythm. No murmur Respiratory: Normal respiratory effort without tachypnea nor retractions. Breath sounds are clear  Gastrointestinal: Soft and nontender. No distention.  Musculoskeletal: Nontender with normal range of motion in all extremities. Neurologic:  Normal speech and language. No gross focal neurologic deficits Skin:  Skin is warm, dry and intact.  Psychiatric: Mood and affect are normal.   ____________________________________________    EKG  EKG reviewed and interpreted by myself shows normal sinus rhythm at 85 bpm, narrow QRS, normal axis, normal intervals, nonspecific ST changes. No ST elevation.  ____________________________________________    RADIOLOGY  CT positive for pulmonary embolus and chest mass with postobstructive changes/collapse.  ____________________________________________   INITIAL IMPRESSION / ASSESSMENT AND PLAN / ED COURSE  Pertinent labs & imaging results that were available during my care of the patient were reviewed by me and considered in my medical decision making (see chart for  details).  The patient presents the emergency department with a CT scan is positive for pulmonary emboli. Unfortunately CT scan also appears to show a mass with postobstructive changes and multiple mediastinal lymph nodes that appear to be enlarged as well as possible liver lesion. Given the multiple bullae we will check labs, start the patient on heparin. I discussed the patient with oncology Dr. Rogue Bussing who recommends admission to the hospital so that they can obtain a biopsy, otherwise if the patient was started on an oral anticoagulant it would delay biopsy. Currently the patient appears well, is mild shortness of breath. We'll obtain a left lower extremity ultrasound to rule out DVT given the patient's mild calf tenderness. Patient does not smoke cigarettes. Denies any history of cancer. Patient works as a Administrator.  Ultrasound consistent with left lower extremity DVT.  I discussed the patient with oncology who recommends admission. Patient will be admitted on a heparin drip for further treatment.  ____________________________________________   FINAL CLINICAL IMPRESSION(S) / ED DIAGNOSES  Pulmonary embolus Chest mass  Harvest Dark, MD 06/03/16 [place]

## 2016-06-03 NOTE — Progress Notes (Signed)
ANTICOAGULATION CONSULT NOTE - Initial Consult  Pharmacy Consult for heparin drip Indication: pulmonary embolus  No Known Allergies  Patient Measurements: Height: '5\' 5"'$  (165.1 cm) Weight: 145 lb (65.8 kg) IBW/kg (Calculated) : 61.5 Heparin Dosing Weight: 66 kg  Vital Signs: Temp: 97.8 F (36.6 C) (09/27 1605) Temp Source: Oral (09/27 1605) BP: 135/81 (09/27 1803) Pulse Rate: 86 (09/27 1803)  Labs:  Recent Labs  06/03/16 1133 06/03/16 1630  HGB  --  12.6*  HCT  --  38.0*  PLT  --  300  LABPROT  --  14.0  INR  --  1.08  CREATININE 1.18 1.23    Estimated Creatinine Clearance: 54.2 mL/min (by C-G formula based on SCr of 1.23 mg/dL).  Medical History: Past Medical History:  Diagnosis Date  . Abnormal prostate specific antigen 08/08/2012  . Adiposity 04/16/2015  . Diabetes mellitus without complication ([place])   . Diverticulosis of sigmoid colon 04/16/2015  . Dyslipidemia 03/18/2015  . Hemorrhoids, internal 04/16/2015  . Hypercholesteremia 04/16/2015  . Hyperlipidemia   . Hypertension   . Wears dentures    partial upper    Assessment: Pharmacy consulted to dose and monitor heparin drip in this 64 year old male diagnosed with pulmonary embolism. Patient denies taking any anticoagulants prior to admission. Baseline labs have been drawn.  Goal of Therapy:  Heparin level 0.3-0.7 units/ml Monitor platelets by anticoagulation protocol: Yes   Plan:  Give 4000 units bolus x 1 Start heparin infusion at 1100 units/hr Check anti-Xa level in 6 hours and daily while on heparin Continue to monitor H&H and platelets  Lenis Noon, PharmD, BCPS Clinical Pharmacist 06/03/2016,6:27 PM

## 2016-06-04 ENCOUNTER — Encounter: Payer: Self-pay | Admitting: Radiology

## 2016-06-04 ENCOUNTER — Inpatient Hospital Stay: Payer: Managed Care, Other (non HMO)

## 2016-06-04 DIAGNOSIS — K7689 Other specified diseases of liver: Secondary | ICD-10-CM

## 2016-06-04 DIAGNOSIS — E785 Hyperlipidemia, unspecified: Secondary | ICD-10-CM

## 2016-06-04 DIAGNOSIS — I82402 Acute embolism and thrombosis of unspecified deep veins of left lower extremity: Secondary | ICD-10-CM

## 2016-06-04 DIAGNOSIS — R634 Abnormal weight loss: Secondary | ICD-10-CM

## 2016-06-04 DIAGNOSIS — I2699 Other pulmonary embolism without acute cor pulmonale: Principal | ICD-10-CM

## 2016-06-04 DIAGNOSIS — R590 Localized enlarged lymph nodes: Secondary | ICD-10-CM

## 2016-06-04 DIAGNOSIS — R222 Localized swelling, mass and lump, trunk: Secondary | ICD-10-CM

## 2016-06-04 DIAGNOSIS — E669 Obesity, unspecified: Secondary | ICD-10-CM

## 2016-06-04 DIAGNOSIS — K5730 Diverticulosis of large intestine without perforation or abscess without bleeding: Secondary | ICD-10-CM

## 2016-06-04 DIAGNOSIS — Z79899 Other long term (current) drug therapy: Secondary | ICD-10-CM

## 2016-06-04 DIAGNOSIS — E7800 Pure hypercholesterolemia, unspecified: Secondary | ICD-10-CM

## 2016-06-04 DIAGNOSIS — Z7901 Long term (current) use of anticoagulants: Secondary | ICD-10-CM

## 2016-06-04 DIAGNOSIS — Z794 Long term (current) use of insulin: Secondary | ICD-10-CM

## 2016-06-04 DIAGNOSIS — I10 Essential (primary) hypertension: Secondary | ICD-10-CM

## 2016-06-04 DIAGNOSIS — R0602 Shortness of breath: Secondary | ICD-10-CM

## 2016-06-04 DIAGNOSIS — K648 Other hemorrhoids: Secondary | ICD-10-CM

## 2016-06-04 DIAGNOSIS — E119 Type 2 diabetes mellitus without complications: Secondary | ICD-10-CM

## 2016-06-04 DIAGNOSIS — J9859 Other diseases of mediastinum, not elsewhere classified: Secondary | ICD-10-CM

## 2016-06-04 DIAGNOSIS — J9811 Atelectasis: Secondary | ICD-10-CM

## 2016-06-04 LAB — BASIC METABOLIC PANEL
ANION GAP: 5 (ref 5–15)
BUN: 14 mg/dL (ref 6–20)
CALCIUM: 9.4 mg/dL (ref 8.9–10.3)
CO2: 24 mmol/L (ref 22–32)
CREATININE: 1.2 mg/dL (ref 0.61–1.24)
Chloride: 105 mmol/L (ref 101–111)
Glucose, Bld: 220 mg/dL — ABNORMAL HIGH (ref 65–99)
Potassium: 4.3 mmol/L (ref 3.5–5.1)
SODIUM: 134 mmol/L — AB (ref 135–145)

## 2016-06-04 LAB — CBC
HEMATOCRIT: 34.3 % — AB (ref 40.0–52.0)
HEMOGLOBIN: 12.1 g/dL — AB (ref 13.0–18.0)
MCH: 28.6 pg (ref 26.0–34.0)
MCHC: 35.2 g/dL (ref 32.0–36.0)
MCV: 81.1 fL (ref 80.0–100.0)
PLATELETS: 306 10*3/uL (ref 150–440)
RBC: 4.23 MIL/uL — AB (ref 4.40–5.90)
RDW: 13.8 % (ref 11.5–14.5)
WBC: 7.8 10*3/uL (ref 3.8–10.6)

## 2016-06-04 LAB — GLUCOSE, CAPILLARY
GLUCOSE-CAPILLARY: 186 mg/dL — AB (ref 65–99)
Glucose-Capillary: 130 mg/dL — ABNORMAL HIGH (ref 65–99)

## 2016-06-04 LAB — HEPARIN LEVEL (UNFRACTIONATED)
HEPARIN UNFRACTIONATED: 0.33 [IU]/mL (ref 0.30–0.70)
Heparin Unfractionated: 0.41 IU/mL (ref 0.30–0.70)

## 2016-06-04 LAB — POCT CBG MONITORING
POCT GLUCOSE (MANUAL ENTRY) KUC: 186 mg/dL — AB (ref 70–99)
POCT Glucose (KUC): 130 mg/dL — AB (ref 70–99)

## 2016-06-04 MED ORDER — IOPAMIDOL (ISOVUE-300) INJECTION 61%
15.0000 mL | INTRAVENOUS | Status: AC
Start: 2016-06-04 — End: 2016-06-04

## 2016-06-04 MED ORDER — SIMVASTATIN 40 MG PO TABS
40.0000 mg | ORAL_TABLET | Freq: Every day | ORAL | Status: DC
  Administered 2016-06-04 – 2016-06-05 (×2): 40 mg via ORAL
  Filled 2016-06-04 (×2): qty 1

## 2016-06-04 MED ORDER — LISINOPRIL 10 MG PO TABS
10.0000 mg | ORAL_TABLET | Freq: Every day | ORAL | Status: DC
  Administered 2016-06-04 – 2016-06-06 (×2): 10 mg via ORAL
  Filled 2016-06-04 (×2): qty 1

## 2016-06-04 MED ORDER — IOPAMIDOL (ISOVUE-300) INJECTION 61%
100.0000 mL | Freq: Once | INTRAVENOUS | Status: AC | PRN
  Administered 2016-06-04: 15:00:00 100 mL via INTRAVENOUS

## 2016-06-04 NOTE — Progress Notes (Signed)
ANTICOAGULATION CONSULT NOTE - Follow Up Consult  Pharmacy Consult for Heparin Drip Indication: pulmonary embolus  No Known Allergies  Patient Measurements: Height: '5\' 5"'$  (165.1 cm) Weight: 145 lb (65.8 kg) IBW/kg (Calculated) : 61.5 Heparin Dosing Weight: 65.8  Vital Signs: Temp: 98.1 F (36.7 C) (09/28 0420) Temp Source: Oral (09/28 0420) BP: 118/74 (09/28 0420) Pulse Rate: 91 (09/28 0420)  Labs:  Recent Labs  06/03/16 1133 06/03/16 1630 06/04/16 0103 06/04/16 0654  HGB  --  12.6* 12.1*  --   HCT  --  38.0* 34.3*  --   PLT  --  300 306  --   APTT  --  26  --   --   LABPROT  --  14.0  --   --   INR  --  1.08  --   --   HEPARINUNFRC  --   --  0.41 0.33  CREATININE 1.18 1.23 1.20  --     Estimated Creatinine Clearance: 55.5 mL/min (by C-G formula based on SCr of 1.2 mg/dL).   Medications:  Scheduled:  . finasteride  5 mg Oral QHS  . glipiZIDE  5 mg Oral BID AC  . insulin aspart  0-9 Units Subcutaneous TID WC  . lisinopril  10 mg Oral Daily  . metFORMIN  500 mg Oral BID WC  . simvastatin  40 mg Oral Daily  . sodium chloride flush  3 mL Intravenous Q12H  . tamsulosin  0.4 mg Oral BH-[id number]   Infusions:  . heparin 1,100 Units/hr (06/03/16 1921)    Assessment: Patient initiated on Heparin drip at 1100 units/hr for PE. CT scan also revealed a lung mass.  9/28 0700 Heparin level 0.33 is therapeutic  Goal of Therapy:  Heparin level 0.3-0.7 units/ml Monitor platelets by anticoagulation protocol: Yes   Plan:  Will continue with current rate of 1100 units/hr. Patient has had 2 therapeutic heparin levels. Will check next heparin level and CBC with AM labs. Will follow up with MD about bridging to oral therapy.  Paulina Fusi, PharmD, BCPS 06/04/2016 9:40 AM

## 2016-06-04 NOTE — Progress Notes (Signed)
[facility] NAME: Melvin Willis    MR#:  [id number]  DATE OF BIRTH:  12-14-1950  SUBJECTIVE:  CHIEF COMPLAINT:   Chief Complaint  Patient presents with  . Shortness of Breath     Came with chest pain and SOB, found to have PE, DVT and lung mass with some involvement of LN and liver.   On heparin drip, NPO for CT and possible procedure today.  REVIEW OF SYSTEMS:  CONSTITUTIONAL: No fever, fatigue or weakness.  EYES: No blurred or double vision.  EARS, NOSE, AND THROAT: No tinnitus or ear pain.  RESPIRATORY: No cough, shortness of breath, wheezing or hemoptysis.  CARDIOVASCULAR: No chest pain, orthopnea, edema.  GASTROINTESTINAL: No nausea, vomiting, diarrhea or abdominal pain.  GENITOURINARY: No dysuria, hematuria.  ENDOCRINE: No polyuria, nocturia,  HEMATOLOGY: No anemia, easy bruising or bleeding SKIN: No rash or lesion. MUSCULOSKELETAL: No joint pain or arthritis.   NEUROLOGIC: No tingling, numbness, weakness.  PSYCHIATRY: No anxiety or depression.   ROS  DRUG ALLERGIES:  No Known Allergies  VITALS:  Blood pressure 126/73, pulse 89, temperature 98.6 F (37 C), temperature source Oral, resp. rate 18, height '5\' 5"'$  (1.651 m), weight 65.8 kg (145 lb), SpO2 99 %.  PHYSICAL EXAMINATION:  GENERAL:  65 y.o.-year-old patient lying in the bed with no acute distress.  EYES: Pupils equal, round, reactive to light and accommodation. No scleral icterus. Extraocular muscles intact.  HEENT: Head atraumatic, normocephalic. Oropharynx and nasopharynx clear.  NECK:  Supple, no jugular venous distention. No thyroid enlargement, no tenderness.  LUNGS: Normal breath sounds bilaterally, no wheezing, rales,rhonchi or crepitation. No use of accessory muscles of respiration.  CARDIOVASCULAR: S1, S2 normal. No murmurs, rubs, or gallops.  ABDOMEN: Soft, nontender, nondistended. Bowel sounds present. No organomegaly or mass.  EXTREMITIES: No  pedal edema, cyanosis, or clubbing.  NEUROLOGIC: Cranial nerves II through XII are intact. Muscle strength 5/5 in all extremities. Sensation intact. Gait not checked.  PSYCHIATRIC: The patient is alert and oriented x 3.  SKIN: No obvious rash, lesion, or ulcer.   Physical Exam LABORATORY PANEL:   CBC  Recent Labs Lab 06/04/16 0103  WBC 7.8  HGB 12.1*  HCT 34.3*  PLT 306   ------------------------------------------------------------------------------------------------------------------  Chemistries   Recent Labs Lab 06/03/16 1630 06/04/16 0103  NA 135 134*  K 4.2 4.3  CL 102 105  CO2 25 24  GLUCOSE 126* 220*  BUN 13 14  CREATININE 1.23 1.20  CALCIUM 9.6 9.4  AST 19  --   ALT 19  --   ALKPHOS 92  --   BILITOT 0.9  --    ------------------------------------------------------------------------------------------------------------------  Cardiac Enzymes No results for input(s): TROPONINI in the last 168 hours. ------------------------------------------------------------------------------------------------------------------  RADIOLOGY:  Ct Angio Chest W/cm &/or Wo Cm  Result Date: 06/03/2016 CLINICAL DATA:  Severe left chest pain radiating down arm since this morning. Shortness of breath and fatigue since lithotripsy approximately 1 month ago. EXAM: CT ANGIOGRAPHY CHEST WITH CONTRAST TECHNIQUE: Multidetector CT imaging of the chest was performed using the standard protocol during bolus administration of intravenous contrast. Multiplanar CT image reconstructions and MIPs were obtained to evaluate the vascular anatomy. CONTRAST:  100 cc Isovue 370 COMPARISON:  None. FINDINGS: Cardiovascular: Multiple pulmonary emboli within the central segmental pulmonary arteries to the bilateral lower lobes, right greater than left. Several additional small pulmonary emboli within the central segmental pulmonary artery branches to the right upper lobe. No evidence of  associated right heart  failure. Heart size is normal. No pericardial effusion. Thoracic aorta is normal in caliber. No aortic dissection. Mediastinum/Nodes: Numerous small and enlarged lymph nodes within the mediastinum, largest lymph nodes within the anterior mediastinum. Representative lymph node within the anterior mediastinum measures 1.9 cm short axis dimension (series 5, image 33). Additional conglomerate lymphadenopathy noted within the precarinal space measuring approximately 3.6 x 2.1 cm. Conglomerate subcarinal lymphadenopathy measures approximately 3.2 x 2.4 cm. Additional suspicious lymph nodes are seen in the right upper paratracheal region (series 5, images 14 through 23). Several mildly prominent supraclavicular lymph nodes are seen bilaterally, largest on the right measuring 1.1 cm short axis dimension (series 5, image 10). Esophagus and trachea are unremarkable. Lungs/Pleura: Dense consolidation is seen within the anterior segment of the left upper lobe, favored to represent airspace collapse secondary to a central obstructing mass. The presumed central obstructing mass is not clearly demarcated. The bronchus to the anterior segment of the left upper lobe terminates at the proximal margin of the consolidation. Lungs otherwise clear.  No pleural effusion. Upper Abdomen: Hypodense masses are seen within the bilateral liver lobes, largest of which is in the left liver lobe demonstrating CT density measurements compatible with benign cysts (2 abutting cysts with largest component measuring 3 cm greatest dimension. A smaller lesion in the right liver lobe measures 1.6 x 1.6 cm, too small to definitively characterize. Limited images of the upper abdomen are otherwise unremarkable. Musculoskeletal: No acute or suspicious osseous finding. Superficial soft tissues are unremarkable. Review of the MIP images confirms the above findings. IMPRESSION: 1. Bilateral small pulmonary emboli within the central segmental pulmonary arteries  to the right lower lobe, right upper lobe and left lower lobe. No evidence of associated right heart failure. 2. Large dense consolidation within the anterior segment of the left upper lobe, almost certainly postobstructive airspace collapse related to a central obstructing mass. The presumed central obstructing mass, however, is not clearly demarcated but is likely at the level of the acutely occluded left upper lobe bronchus. There is additional obstruction of the adjacent left upper lobe pulmonary vein. 3. Fairly extensive mediastinal lymphadenopathy, with measurements given above. Additional mildly prominent lymph nodes within the supraclavicular regions are also suspicious for metastatic lymphadenopathy. 4. Hypodense masses within each liver lobe, largest located within the left liver lobe has the appearance of 2 abutting simple cysts based on CT density measurements. The lesion within the right liver lobe is too small to definitively characterize and requires further characterization with MRI to exclude metastasis. Critical Value/emergent results were called by telephone at the time of interpretation on 06/03/2016 at 3:30 pm to Dr. Dossie Der [facility] , who verbally acknowledged these results. Electronically Signed   By: Franki Cabot M.D.   On: 06/03/2016 15:35   US Venous Img Lower Unilateral Left  Result Date: 06/03/2016 CLINICAL DATA:  Subacute onset of left lower extremity pain. Known pulmonary embolus. Initial encounter. EXAM: LEFT LOWER EXTREMITY VENOUS DOPPLER ULTRASOUND TECHNIQUE: Gray-scale sonography with graded compression, as well as color Doppler and duplex ultrasound were performed to evaluate the lower extremity deep venous systems from the level of the common femoral vein and including the common femoral, femoral, profunda femoral, popliteal and calf veins including the posterior tibial, peroneal and gastrocnemius veins when visible. The superficial great saphenous vein was also interrogated.  Spectral Doppler was utilized to evaluate flow at rest and with distal augmentation maneuvers in the common femoral, femoral and popliteal veins. COMPARISON:  None. FINDINGS:  Contralateral Common Femoral Vein: Respiratory phasicity is normal and symmetric with the symptomatic side. No evidence of thrombus. Normal compressibility. Common Femoral Vein: No evidence of thrombus. Normal compressibility, respiratory phasicity and response to augmentation. Saphenofemoral Junction: No evidence of thrombus. Normal compressibility and flow on color Doppler imaging. Profunda Femoral Vein: No evidence of thrombus. Normal compressibility and flow on color Doppler imaging. Femoral Vein: No evidence of thrombus. Normal compressibility, respiratory phasicity and response to augmentation. Popliteal Vein: No evidence of thrombus. Normal compressibility, respiratory phasicity and response to augmentation. Calf Veins: Occlusive thrombus is noted within one of the patient's left posterior tibial veins. The remaining visualized calf veins are unremarkable. Superficial Great Saphenous Vein: No evidence of thrombus. Normal compressibility and flow on color Doppler imaging. Venous Reflux:  None. Other Findings:  None. IMPRESSION: Occlusive deep venous thrombus noted within one of the patient's posterior tibial veins. These results were called by telephone at the time of interpretation on 06/03/2016 at 6:16 pm to Dr. Harvest Dark, who verbally acknowledged these results. Electronically Signed   By: Garald Balding M.D.   On: 06/03/2016 18:19    ASSESSMENT AND PLAN:   Principal Problem:   PE (pulmonary embolism) Active Problems:   DVT (deep venous thrombosis) (HCC)   Lung mass  * PE and DVT   Heparin drip now.   As he may need Biopsy on mass, no oral agents till then.   No signs of Rt heart strain on CT.   Get CT abdomen to better evaluate liver involvement.  * Lung mass and mediastinal LN   Oncology and pulm consult.    May need bronch, but as per Dr Isidore Moos- it will be as out pt due to acute PE now.   CT abd with contrast to get better idea on liver mets.  * Htn   Cont home meds  * DM   Cont home meds and on ISS.  * Hyperlipidemia   Cont statin.    All the records are reviewed and case discussed with Care Management/Social Workerr. Management plans discussed with the patient, family and they are in agreement.  CODE STATUS: Full.  TOTAL TIME TAKING CARE OF THIS PATIENT: 35 minutes.   Discussed with his wife also in room.  POSSIBLE D/C IN 1-2 DAYS, DEPENDING ON CLINICAL CONDITION.   Vaughan Basta M.D on 06/04/2016   Between 7am to 6pm - Pager - [phone]  After 6pm go to [email or web address] - password [email or web address] [facility]  Office  [phone]  CC: Primary care physician; Keith Rake, MD  Note: This dictation was prepared with Dragon dictation along with smaller phrase technology. Any transcriptional errors that result from this process are unintentional.

## 2016-06-04 NOTE — Care Management (Signed)
Admitted to [facility] with the diagnosis of PE. Wife is Todd Jelinski [phone]). Last seen Dr. Keith Rake 06/03/16 (primary care physician). Gallbladder surgery 05/05/16. Takes care of all basic and instrumental activities  of daily living himself Oncology consult. Possible liver/lung mets) CT of abdomen ordered. Heparin Drip Shelbie Ammons RN MSN CCM Care Management [phone]

## 2016-06-04 NOTE — Progress Notes (Signed)
Discussed case with Dr. Anselm Jungling, acute PE with lung/mediastinal mass. Given acute PE would not perform procedure in this acute phase. Will need to re-eval outpt and plan for bronchoscopy at that time with bridging anti-coagulation. Pt may resume diet.   Marda Stalker, M.D.  06/04/2016

## 2016-06-04 NOTE — Plan of Care (Signed)
Problem: Education: Goal: Knowledge of [name] Education information/materials will improve Outcome: Progressing Pt likes to be called [name]  PAST MEDICAL HISTORY:   Past Medical History:  Diagnosis Date  . Abnormal prostate specific antigen 08/08/2012  . Adiposity 04/16/2015  . Diabetes mellitus without complication ([place])   . Diverticulosis of sigmoid colon 04/16/2015  . Dyslipidemia 03/18/2015  . Hemorrhoids, internal 04/16/2015  . Hypercholesteremia 04/16/2015  . Hyperlipidemia   . Hypertension   . Wears dentures    partial upper   Pt is well controlled with home medications

## 2016-06-04 NOTE — Progress Notes (Signed)
ANTICOAGULATION CONSULT NOTE - Initial Consult  Pharmacy Consult for heparin drip Indication: pulmonary embolus  No Known Allergies  Patient Measurements: Height: '5\' 5"'$  (165.1 cm) Weight: 145 lb (65.8 kg) IBW/kg (Calculated) : 61.5 Heparin Dosing Weight: 66 kg  Vital Signs: Temp: 98.6 F (37 C) (09/27 2117) Temp Source: Oral (09/27 2117) BP: 134/79 (09/27 2117) Pulse Rate: 99 (09/27 2117)  Labs:  Recent Labs  06/03/16 1133 06/03/16 1630 06/04/16 0103  HGB  --  12.6* 12.1*  HCT  --  38.0* 34.3*  PLT  --  300 306  APTT  --  26  --   LABPROT  --  14.0  --   INR  --  1.08  --   HEPARINUNFRC  --   --  0.41  CREATININE 1.18 1.23 1.20    Estimated Creatinine Clearance: 55.5 mL/min (by C-G formula based on SCr of 1.2 mg/dL).  Medical History: Past Medical History:  Diagnosis Date  . Abnormal prostate specific antigen 08/08/2012  . Adiposity 04/16/2015  . Diabetes mellitus without complication ([place])   . Diverticulosis of sigmoid colon 04/16/2015  . Dyslipidemia 03/18/2015  . Hemorrhoids, internal 04/16/2015  . Hypercholesteremia 04/16/2015  . Hyperlipidemia   . Hypertension   . Wears dentures    partial upper    Assessment: Pharmacy consulted to dose and monitor heparin drip in this 65 year old male diagnosed with pulmonary embolism. Patient denies taking any anticoagulants prior to admission. Baseline labs have been drawn.  Goal of Therapy:  Heparin level 0.3-0.7 units/ml Monitor platelets by anticoagulation protocol: Yes   Plan:  9/28 0103 first heparin level therapeutic. Continue current rate. Will recheck heparin level in 6 hours.  Laural Benes, PharmD, BCPS Clinical Pharmacist 06/04/2016,1:53 AM

## 2016-06-04 NOTE — Consult Note (Addendum)
PULMONARY / CRITICAL CARE MEDICINE   Name: Melvin Willis MRN: [id number] DOB: 1951/04/20    ADMISSION DATE:  06/03/2016 CONSULTATION DATE:  06/04/16  REFERRING MD :  Dr. Anselm Jungling Reason for consult - Hilar mass and PE  CHIEF COMPLAINT:     Short of breath.    HISTORY OF PRESENT ILLNESS    64 y.o. male with a known history of Dm, HTn, Hlp, BPH, elevated PSA with recent TURP, complained fo chest pain and some SOB for last 3-4 days- went to PMD and CT chest was done- showed PE and a lung mass- was advised by PMD to come to the ER,CT also showed showed partial liver windows with liver lesion. Noted to have some left leg swelling last week, this with the SOB, prompted PMD to the the outpatient CT Chest.  DVT studies positive in ER. PCCM consulted for PE and lung mass/hilar mass   PAST MEDICAL HISTORY    :  Past Medical History:  Diagnosis Date  . Abnormal prostate specific antigen 08/08/2012  . Adiposity 04/16/2015  . Diabetes mellitus without complication ([place])   . Diverticulosis of sigmoid colon 04/16/2015  . Dyslipidemia 03/18/2015  . Hemorrhoids, internal 04/16/2015  . Hypercholesteremia 04/16/2015  . Hyperlipidemia   . Hypertension   . Wears dentures    partial upper   Past Surgical History:  Procedure Laterality Date  . COLONOSCOPY    . COLONOSCOPY WITH PROPOFOL N/A 05/06/2015   Procedure: COLONOSCOPY WITH PROPOFOL;  Surgeon: Lucilla Lame, MD;  Location: [place];  Service: Endoscopy;  Laterality: N/A;  ASCENDING COLON POLYPS X 2 TERMINAL ILEUM BIOPSY RANDOM COLON BX. TRANSVERSE COLON POLYP SIGMOID COLON POLYP  . ESOPHAGOGASTRODUODENOSCOPY (EGD) WITH PROPOFOL N/A 05/06/2015   Procedure: ESOPHAGOGASTRODUODENOSCOPY (EGD) WITH PROPOFOL;  Surgeon: Lucilla Lame, MD;  Location: [place];  Service: Endoscopy;  Laterality: N/A;  GASTRIC BIOPSY X1   Prior to Admission medications   Medication Sig Start Date End Date Taking? Authorizing Provider  aspirin  (BAYER ASPIRIN EC LOW DOSE) 81 MG EC tablet Take 81 mg by mouth every morning.    Yes Historical Provider, MD  finasteride (PROSCAR) 5 MG tablet Take 5 mg by mouth at bedtime.  03/04/15  Yes Historical Provider, MD  glipiZIDE (GLUCOTROL) 5 MG tablet Take 1 tablet (5 mg total) by mouth 2 (two) times daily before a meal. 04/03/16  Yes Roselee Nova, MD  lisinopril (PRINIVIL,ZESTRIL) 10 MG tablet TAKE ONE TABLET BY MOUTH ONCE DAILY 01/20/16  Yes Roselee Nova, MD  metFORMIN (GLUCOPHAGE) 1000 MG tablet Take 0.5 tablets (500 mg total) by mouth 2 (two) times daily. 04/03/16  Yes Roselee Nova, MD  simvastatin (ZOCOR) 40 MG tablet Take 1 tablet (40 mg total) by mouth daily. Patient taking differently: Take 40 mg by mouth at bedtime.  04/03/16  Yes Roselee Nova, MD  tamsulosin (FLOMAX) 0.4 MG CAPS capsule Take 0.4 mg by mouth every morning.  01/24/15  Yes Historical Provider, MD   No Known Allergies   FAMILY HISTORY   Family History  Problem Relation Age of Onset  . Diabetes Mother   . Diabetes Father   . CAD Father   . Diabetes Sister       SOCIAL HISTORY    reports that he has never smoked. He has never used smokeless tobacco. He reports that he does not drink alcohol or use drugs.  Review of Systems  Constitutional: Negative for chills and  fever.  Respiratory: Positive for cough and shortness of breath.   Cardiovascular: Positive for chest pain.  Gastrointestinal: Negative for heartburn and nausea.  Genitourinary: Negative for dysuria.  Musculoskeletal: Negative for myalgias.  Skin: Negative for rash.  Neurological: Negative for dizziness and headaches.  Endo/Heme/Allergies: Negative for environmental allergies.  Psychiatric/Behavioral: Negative for depression.      VITAL SIGNS    Temp:  [97.8 F (36.6 C)-98.6 F (37 C)] 98.1 F (36.7 C) (09/28 0420) Pulse Rate:  [86-101] 91 (09/28 0420) Resp:  [15-20] 20 (09/28 0420) BP: (118-146)/(74-88) 118/74 (09/28  0420) SpO2:  [99 %-100 %] 99 % (09/28 0420) Weight:  [145 lb (65.8 kg)] 145 lb (65.8 kg) (09/27 1606) HEMODYNAMICS:   VENTILATOR SETTINGS:   INTAKE / OUTPUT:  Intake/Output Summary (Last 24 hours) at 06/04/16 1353 Last data filed at 06/04/16 0630  Gross per 24 hour  Intake              161 ml  Output                0 ml  Net              161 ml       PHYSICAL EXAM   Physical Exam  Constitutional: He is oriented to person, place, and time. He appears well-developed and well-nourished.  HENT:  Head: Normocephalic and atraumatic.  Eyes: Conjunctivae and EOM are normal.  Cardiovascular: Normal rate, regular rhythm, normal heart sounds and intact distal pulses.   Pulmonary/Chest: Effort normal and breath sounds normal. No respiratory distress. He has no wheezes. He has no rales. He exhibits no tenderness.  Abdominal: Soft. Bowel sounds are normal. He exhibits no distension. There is no tenderness.  Musculoskeletal: Normal range of motion.  Neurological: He is alert and oriented to person, place, and time.  Skin: Skin is warm and dry.  Nursing note and vitals reviewed.      LABS   LABS:  CBC  Recent Labs Lab 06/03/16 1630 06/04/16 0103  WBC 6.7 7.8  HGB 12.6* 12.1*  HCT 38.0* 34.3*  PLT 300 306   Coag's  Recent Labs Lab 06/03/16 1630  APTT 26  INR 1.08   BMET  Recent Labs Lab 06/03/16 1133 06/03/16 1630 06/04/16 0103  NA 137 135 134*  K 4.5 4.2 4.3  CL 106 102 105  CO2 '23 25 24  '$ BUN '15 13 14  '$ CREATININE 1.18 1.23 1.20  GLUCOSE 105* 126* 220*   Electrolytes  Recent Labs Lab 06/03/16 1133 06/03/16 1630 06/04/16 0103  CALCIUM 9.7 9.6 9.4   Sepsis Markers No results for input(s): LATICACIDVEN, PROCALCITON, O2SATVEN in the last 168 hours. ABG No results for input(s): PHART, PCO2ART, PO2ART in the last 168 hours. Liver Enzymes  Recent Labs Lab 06/03/16 1133 06/03/16 1630  AST 17 19  ALT 18 19  ALKPHOS 88 92  BILITOT 0.8 0.9   ALBUMIN 3.9 4.2   Cardiac Enzymes No results for input(s): TROPONINI, PROBNP in the last 168 hours. Glucose  Recent Labs Lab 06/03/16 2218 06/04/16 0747  GLUCAP 228* 186*     No results found for this or any previous visit (from the past 240 hour(s)).   Current Facility-Administered Medications:  .  finasteride (PROSCAR) tablet 5 mg, 5 mg, Oral, QHS, Vaughan Basta, MD, 5 mg at 06/03/16 2250 .  glipiZIDE (GLUCOTROL) tablet 5 mg, 5 mg, Oral, BID AC, Vaughan Basta, MD .  heparin ADULT infusion 100 units/mL (25000 units/298m  sodium chloride 0.45%), 1,100 Units/hr, Intravenous, Continuous, Lenis Noon, [facility], Last Rate: 11 mL/hr at 06/03/16 1921, 1,100 Units/hr at 06/03/16 1921 .  insulin aspart (novoLOG) injection 0-9 Units, 0-9 Units, Subcutaneous, TID WC, Vaughan Basta, MD, 2 Units at 06/04/16 1239 .  lisinopril (PRINIVIL,ZESTRIL) tablet 10 mg, 10 mg, Oral, Daily, Vaughan Basta, MD .  metFORMIN (GLUCOPHAGE) tablet 500 mg, 500 mg, Oral, BID WC, Vaughan Basta, MD .  simvastatin (ZOCOR) tablet 40 mg, 40 mg, Oral, Daily, Vira Blanco, RPH .  sodium chloride flush (NS) 0.9 % injection 3 mL, 3 mL, Intravenous, Q12H, Vaughan Basta, MD, 3 mL at 06/04/16 0907 .  tamsulosin (FLOMAX) capsule 0.4 mg, 0.4 mg, Oral, BH-q7a, Vaughan Basta, MD, 0.4 mg at 06/04/16 0630  IMAGING    Ct Angio Chest W/cm &/or Wo Cm  Result Date: 06/03/2016 CLINICAL DATA:  Severe left chest pain radiating down arm since this morning. Shortness of breath and fatigue since lithotripsy approximately 1 month ago. EXAM: CT ANGIOGRAPHY CHEST WITH CONTRAST TECHNIQUE: Multidetector CT imaging of the chest was performed using the standard protocol during bolus administration of intravenous contrast. Multiplanar CT image reconstructions and MIPs were obtained to evaluate the vascular anatomy. CONTRAST:  100 cc Isovue 370 COMPARISON:  None. FINDINGS: Cardiovascular: Multiple  pulmonary emboli within the central segmental pulmonary arteries to the bilateral lower lobes, right greater than left. Several additional small pulmonary emboli within the central segmental pulmonary artery branches to the right upper lobe. No evidence of associated right heart failure. Heart size is normal. No pericardial effusion. Thoracic aorta is normal in caliber. No aortic dissection. Mediastinum/Nodes: Numerous small and enlarged lymph nodes within the mediastinum, largest lymph nodes within the anterior mediastinum. Representative lymph node within the anterior mediastinum measures 1.9 cm short axis dimension (series 5, image 33). Additional conglomerate lymphadenopathy noted within the precarinal space measuring approximately 3.6 x 2.1 cm. Conglomerate subcarinal lymphadenopathy measures approximately 3.2 x 2.4 cm. Additional suspicious lymph nodes are seen in the right upper paratracheal region (series 5, images 14 through 23). Several mildly prominent supraclavicular lymph nodes are seen bilaterally, largest on the right measuring 1.1 cm short axis dimension (series 5, image 10). Esophagus and trachea are unremarkable. Lungs/Pleura: Dense consolidation is seen within the anterior segment of the left upper lobe, favored to represent airspace collapse secondary to a central obstructing mass. The presumed central obstructing mass is not clearly demarcated. The bronchus to the anterior segment of the left upper lobe terminates at the proximal margin of the consolidation. Lungs otherwise clear.  No pleural effusion. Upper Abdomen: Hypodense masses are seen within the bilateral liver lobes, largest of which is in the left liver lobe demonstrating CT density measurements compatible with benign cysts (2 abutting cysts with largest component measuring 3 cm greatest dimension. A smaller lesion in the right liver lobe measures 1.6 x 1.6 cm, too small to definitively characterize. Limited images of the upper abdomen  are otherwise unremarkable. Musculoskeletal: No acute or suspicious osseous finding. Superficial soft tissues are unremarkable. Review of the MIP images confirms the above findings. IMPRESSION: 1. Bilateral small pulmonary emboli within the central segmental pulmonary arteries to the right lower lobe, right upper lobe and left lower lobe. No evidence of associated right heart failure. 2. Large dense consolidation within the anterior segment of the left upper lobe, almost certainly postobstructive airspace collapse related to a central obstructing mass. The presumed central obstructing mass, however, is not clearly demarcated but is likely at the level  of the acutely occluded left upper lobe bronchus. There is additional obstruction of the adjacent left upper lobe pulmonary vein. 3. Fairly extensive mediastinal lymphadenopathy, with measurements given above. Additional mildly prominent lymph nodes within the supraclavicular regions are also suspicious for metastatic lymphadenopathy. 4. Hypodense masses within each liver lobe, largest located within the left liver lobe has the appearance of 2 abutting simple cysts based on CT density measurements. The lesion within the right liver lobe is too small to definitively characterize and requires further characterization with MRI to exclude metastasis. Critical Value/emergent results were called by telephone at the time of interpretation on 06/03/2016 at 3:30 pm to Dr. Dossie [facility] , who verbally acknowledged these results. Electronically Signed   By: Franki Cabot M.D.   On: 06/03/2016 15:35   US Venous Img Lower Unilateral Left  Result Date: 06/03/2016 CLINICAL DATA:  Subacute onset of left lower extremity pain. Known pulmonary embolus. Initial encounter. EXAM: LEFT LOWER EXTREMITY VENOUS DOPPLER ULTRASOUND TECHNIQUE: Gray-scale sonography with graded compression, as well as color Doppler and duplex ultrasound were performed to evaluate the lower extremity deep venous  systems from the level of the common femoral vein and including the common femoral, femoral, profunda femoral, popliteal and calf veins including the posterior tibial, peroneal and gastrocnemius veins when visible. The superficial great saphenous vein was also interrogated. Spectral Doppler was utilized to evaluate flow at rest and with distal augmentation maneuvers in the common femoral, femoral and popliteal veins. COMPARISON:  None. FINDINGS: Contralateral Common Femoral Vein: Respiratory phasicity is normal and symmetric with the symptomatic side. No evidence of thrombus. Normal compressibility. Common Femoral Vein: No evidence of thrombus. Normal compressibility, respiratory phasicity and response to augmentation. Saphenofemoral Junction: No evidence of thrombus. Normal compressibility and flow on color Doppler imaging. Profunda Femoral Vein: No evidence of thrombus. Normal compressibility and flow on color Doppler imaging. Femoral Vein: No evidence of thrombus. Normal compressibility, respiratory phasicity and response to augmentation. Popliteal Vein: No evidence of thrombus. Normal compressibility, respiratory phasicity and response to augmentation. Calf Veins: Occlusive thrombus is noted within one of the patient's left posterior tibial veins. The remaining visualized calf veins are unremarkable. Superficial Great Saphenous Vein: No evidence of thrombus. Normal compressibility and flow on color Doppler imaging. Venous Reflux:  None. Other Findings:  None. IMPRESSION: Occlusive deep venous thrombus noted within one of the patient's posterior tibial veins. These results were called by telephone at the time of interpretation on 06/03/2016 at 6:16 pm to Dr. Harvest Dark, who verbally acknowledged these results. Electronically Signed   By: Garald Balding M.D.   On: 06/03/2016 18:19      Indwelling Urinary Catheter continued, requirement due to   Reason to continue Indwelling Urinary Catheter for strict  Intake/Output monitoring for hemodynamic instability   Central Line continued, requirement due to   Reason to continue [employer] Monitoring of central venous pressure or other hemodynamic parameters   Ventilator continued, requirement due to, resp failure    Ventilator Sedation RASS 0 to -2   Cultures: BCx2  UC  Sputum  Antibiotics:  Lines:   ASSESSMENT/PLAN   65 yo with PMHx of DM, Prostate Ca,HLD, HTN, now with PE and hilar lung mass  Pulmonary Embolus - Provoked, possible related to malignancy Hilar/Mediastinal Lung Mass LUL Partial collapse vs Atelectasis Occlusive DVT in Left Tibial Vein Hx of Prostate Ca DM HTN   Plan: CT chest images reviewed, cannot perform bronchoscopy with biopsy of Hilar/Mediastinal mass (high risk for MI) at  this time.  Continue with anticoagulation for PE, heparin, do not start oral anticoagulation until speaking with IR and Heme/Onc Patient will need a minimum of 3 months of anticoagulation  Check CT A/P  - could possibly Biopsy the liver lesions Cont with antibiotics and nebulizer PRN Check ECHO Discuss case with IR and Heme Onc, maybe liver lesions could be biopsied or CT guided biopsy of the lung mass that is abutting the chest wall. Hilar/Mediastinal mass biopsy will have to wait about 4 weeks.  Patient will follow up with Dr. Ashby Dawes in 4-6 weeks as an outpatient.   Thank you for consulting [facility], we will signoff at this time.  Please feel free to contact us with any questions at [phone] (please enter [phone]).   I have personally obtained a history, examined the patient, evaluated laboratory and imaging results, formulated the assessment and plan and placed orders.  The Patient requires high complexity decision making for assessment and support, frequent evaluation and titration of therapies, application of advanced monitoring technologies and extensive interpretation of multiple  databases.  Pulmonary Care Time devoted to patient care services described in this note is 40 minutes.    Vilinda Boehringer, MD [facility] Pager [phone] (please enter 7-digits) On Call Pager [phone] (please enter 7-digits)     06/04/2016, 1:53 PM  Note: This note was prepared with Dragon dictation along with smaller phrase technology. Any transcriptional errors that result from this process are unintentional.

## 2016-06-04 NOTE — Progress Notes (Signed)
[facility] NOTE  Patient Care Team: Roselee Nova, MD as PCP - General (Family Medicine)  CHIEF COMPLAINTS/PURPOSE OF CONSULTATION: Bilateral PE/ metastatic mediastinal adenopathy  HISTORY OF PRESENTING ILLNESS:  Melvin Willis 65 y.o.  male with recent urologic procedure at [facility] [Dr.Cope]- presented to his PCP for worsening shortness of breath; and a CTA showed bilateral PE. Patient is currently on IV heparin  CT scan was also left upper lobe collapse- likely secondary to central mass; mediastinal adenopathy also mild supraclavicular adenopathy on the right side; liver cysts. Oncology has been consult for further evaluation.  Patient admits to mild weight loss. Does not smoke. Had a recent EGD colonoscopy.   Currently shortness of breath chest pain improved. No nausea or vomiting. No headaches.   Patient complains of left leg cramping pain.- Dopplers positive for DVT.   ROS: A complete 10 point review of system is done which is negative except mentioned above in history of present illness  MEDICAL HISTORY:  Past Medical History:  Diagnosis Date  . Abnormal prostate specific antigen 08/08/2012  . Adiposity 04/16/2015  . Diabetes mellitus without complication ([place])   . Diverticulosis of sigmoid colon 04/16/2015  . Dyslipidemia 03/18/2015  . Hemorrhoids, internal 04/16/2015  . Hypercholesteremia 04/16/2015  . Hyperlipidemia   . Hypertension   . Wears dentures    partial upper    SURGICAL HISTORY: Past Surgical History:  Procedure Laterality Date  . COLONOSCOPY    . COLONOSCOPY WITH PROPOFOL N/A 05/06/2015   Procedure: COLONOSCOPY WITH PROPOFOL;  Surgeon: Lucilla Lame, MD;  Location: [facility];  Service: Endoscopy;  Laterality: N/A;  ASCENDING COLON POLYPS X 2 TERMINAL ILEUM BIOPSY RANDOM COLON BX. TRANSVERSE COLON POLYP SIGMOID COLON POLYP  . ESOPHAGOGASTRODUODENOSCOPY (EGD) WITH PROPOFOL N/A 05/06/2015   Procedure: ESOPHAGOGASTRODUODENOSCOPY  (EGD) WITH PROPOFOL;  Surgeon: Lucilla Lame, MD;  Location: [facility] [facility];  Service: Endoscopy;  Laterality: N/A;  GASTRIC BIOPSY X1    SOCIAL HISTORY: Social History   Social History  . Marital status: Married    Spouse name: N/A  . Number of children: N/A  . Years of education: N/A   Occupational History  . Not on file.   Social History Main Topics  . Smoking status: Never Smoker  . Smokeless tobacco: Never Used  . Alcohol use No  . Drug use: No  . Sexual activity: Yes    Partners: Female   Other Topics Concern  . Not on file   Social History Narrative  . No narrative on file    FAMILY HISTORY: Family History  Problem Relation Age of Onset  . Diabetes Mother   . Diabetes Father   . CAD Father   . Diabetes Sister     ALLERGIES:  has No Known Allergies.  MEDICATIONS:  Current Facility-Administered Medications  Medication Dose Route Frequency Provider Last Rate Last Dose  . finasteride (PROSCAR) tablet 5 mg  5 mg Oral QHS Vaughan Basta, MD   5 mg at 06/03/16 2250  . glipiZIDE (GLUCOTROL) tablet 5 mg  5 mg Oral BID AC Vaughan Basta, MD      . heparin ADULT infusion 100 units/mL (25000 units/276m sodium chloride 0.45%)  1,100 Units/hr Intravenous Continuous MLenis Noon RPH 11 mL/hr at 06/04/16 1419 1,100 Units/hr at 06/04/16 1419  . insulin aspart (novoLOG) injection 0-9 Units  0-9 Units Subcutaneous TID WC VVaughan Basta MD   2 Units at 06/04/16 1239  . lisinopril (PRINIVIL,ZESTRIL) tablet 10  mg  10 mg Oral Daily Vaughan Basta, MD      . metFORMIN (GLUCOPHAGE) tablet 500 mg  500 mg Oral BID WC Vaughan Basta, MD      . simvastatin (ZOCOR) tablet 40 mg  40 mg Oral Daily Vira Blanco, RPH      . sodium chloride flush (NS) 0.9 % injection 3 mL  3 mL Intravenous Q12H Vaughan Basta, MD   3 mL at 06/04/16 0907  . tamsulosin (FLOMAX) capsule 0.4 mg  0.4 mg Oral Martin Majestic, MD   0.4 mg at 06/04/16  0630      .  PHYSICAL EXAMINATION:  Vitals:   06/04/16 0420 06/04/16 1438  BP: 118/74 126/73  Pulse: 91 89  Resp: 20 18  Temp: 98.1 F (36.7 C) 98.6 F (37 C)   Filed Weights   06/03/16 1606  Weight: 145 lb (65.8 kg)    GENERAL: Well-nourished well-developed; Alert, no distress and comfortable.  Accompanied by his wife. EYES: no pallor or icterus OROPHARYNX: no thrush or ulceration. NECK: supple, no masses felt LYMPH:  no palpable lymphadenopathy in the cervical, axillary or inguinal regions LUNGS: decreased breath sounds to auscultation at bases and  No wheeze or crackles HEART/CVS: regular rate & rhythm and no murmurs; No lower extremity edema ABDOMEN: abdomen soft, non-tender and normal bowel sounds Musculoskeletal:no cyanosis of digits and no clubbing  PSYCH: alert & oriented x 3 with fluent speech NEURO: no focal motor/sensory deficits SKIN:  no rashes or significant lesions  LABORATORY DATA:  I have reviewed the data as listed Lab Results  Component Value Date   WBC 7.8 06/04/2016   HGB 12.1 (L) 06/04/2016   HCT 34.3 (L) 06/04/2016   MCV 81.1 06/04/2016   PLT 306 06/04/2016    Recent Labs  01/01/16 1000 06/03/16 1133 06/03/16 1630 06/04/16 0103  NA 139 137 135 134*  K 4.8 4.5 4.2 4.3  CL 102 106 102 105  CO2 '21 23 25 24  '$ GLUCOSE 69 105* 126* 220*  BUN '13 15 13 14  '$ CREATININE 1.11 1.18 1.23 1.20  CALCIUM 10.1 9.7 9.6 9.4  GFRNONAA 71 >60 >60 >60  GFRAA 82 >60 >60 >60  PROT 7.2 7.8 8.1  --   ALBUMIN 4.4 3.9 4.2  --   AST '14 17 19  '$ --   ALT '24 18 19  '$ --   ALKPHOS 80 88 92  --   BILITOT 0.4 0.8 0.9  --     RADIOGRAPHIC STUDIES: I have personally reviewed the radiological images as listed and agreed with the findings in the report. Ct Angio Chest W/cm &/or Wo Cm  Result Date: 06/03/2016 CLINICAL DATA:  Severe left chest pain radiating down arm since this morning. Shortness of breath and fatigue since lithotripsy approximately 1 month ago.  EXAM: CT ANGIOGRAPHY CHEST WITH CONTRAST TECHNIQUE: Multidetector CT imaging of the chest was performed using the standard protocol during bolus administration of intravenous contrast. Multiplanar CT image reconstructions and MIPs were obtained to evaluate the vascular anatomy. CONTRAST:  100 cc Isovue 370 COMPARISON:  None. FINDINGS: Cardiovascular: Multiple pulmonary emboli within the central segmental pulmonary arteries to the bilateral lower lobes, right greater than left. Several additional small pulmonary emboli within the central segmental pulmonary artery branches to the right upper lobe. No evidence of associated right heart failure. Heart size is normal. No pericardial effusion. Thoracic aorta is normal in caliber. No aortic dissection. Mediastinum/Nodes: Numerous small and enlarged lymph nodes within  the mediastinum, largest lymph nodes within the anterior mediastinum. Representative lymph node within the anterior mediastinum measures 1.9 cm short axis dimension (series 5, image 33). Additional conglomerate lymphadenopathy noted within the precarinal space measuring approximately 3.6 x 2.1 cm. Conglomerate subcarinal lymphadenopathy measures approximately 3.2 x 2.4 cm. Additional suspicious lymph nodes are seen in the right upper paratracheal region (series 5, images 14 through 23). Several mildly prominent supraclavicular lymph nodes are seen bilaterally, largest on the right measuring 1.1 cm short axis dimension (series 5, image 10). Esophagus and trachea are unremarkable. Lungs/Pleura: Dense consolidation is seen within the anterior segment of the left upper lobe, favored to represent airspace collapse secondary to a central obstructing mass. The presumed central obstructing mass is not clearly demarcated. The bronchus to the anterior segment of the left upper lobe terminates at the proximal margin of the consolidation. Lungs otherwise clear.  No pleural effusion. Upper Abdomen: Hypodense masses are  seen within the bilateral liver lobes, largest of which is in the left liver lobe demonstrating CT density measurements compatible with benign cysts (2 abutting cysts with largest component measuring 3 cm greatest dimension. A smaller lesion in the right liver lobe measures 1.6 x 1.6 cm, too small to definitively characterize. Limited images of the upper abdomen are otherwise unremarkable. Musculoskeletal: No acute or suspicious osseous finding. Superficial soft tissues are unremarkable. Review of the MIP images confirms the above findings. IMPRESSION: 1. Bilateral small pulmonary emboli within the central segmental pulmonary arteries to the right lower lobe, right upper lobe and left lower lobe. No evidence of associated right heart failure. 2. Large dense consolidation within the anterior segment of the left upper lobe, almost certainly postobstructive airspace collapse related to a central obstructing mass. The presumed central obstructing mass, however, is not clearly demarcated but is likely at the level of the acutely occluded left upper lobe bronchus. There is additional obstruction of the adjacent left upper lobe pulmonary vein. 3. Fairly extensive mediastinal lymphadenopathy, with measurements given above. Additional mildly prominent lymph nodes within the supraclavicular regions are also suspicious for metastatic lymphadenopathy. 4. Hypodense masses within each liver lobe, largest located within the left liver lobe has the appearance of 2 abutting simple cysts based on CT density measurements. The lesion within the right liver lobe is too small to definitively characterize and requires further characterization with MRI to exclude metastasis. Critical Value/emergent results were called by telephone at the time of interpretation on 06/03/2016 at 3:30 pm to Dr. Dossie Der [facility] , who verbally acknowledged these results. Electronically Signed   By: Franki Cabot M.D.   On: 06/03/2016 15:35   Ct Abdomen Pelvis W  Contrast  Result Date: 06/04/2016 CLINICAL DATA:  A recent chest CT demonstrated pulmonary emboli eye and concern for malignancy. Possible liver lesion on chest CT. EXAM: CT ABDOMEN AND PELVIS WITH CONTRAST TECHNIQUE: Multidetector CT imaging of the abdomen and pelvis was performed using the standard protocol following bolus administration of intravenous contrast. CONTRAST:  186m ISOVUE-300 IOPAMIDOL (ISOVUE-300) INJECTION 61% COMPARISON:  Chest CTA 06/03/2016 FINDINGS: Lower chest: Lung bases are clear without pleural fluid. A few of the known pulmonary emboli are visualized at the lung bases. Hepatobiliary: There are low-density structures scattered throughout the liver. The largest structures are water attenuation and compatible with cysts. The largest is a bilobed or slightly complex cyst near the left hepatic dome measuring up to 4.4 cm. Some of the smaller structures are too small to definitively characterize. Portal venous system is patent. Normal  appearance of the gallbladder. No biliary dilatation. Pancreas: Focal fat near the junction of pancreatic head and uncinate process. Otherwise, normal appearance of the pancreas. Spleen: Peripheral low-density structure along the superior spleen measures roughly 2.2 cm. This could be related to a splenic infarct. Remainder of the spleen has a normal appearance. Adrenals/Urinary Tract: Indeterminate 1.2 cm nodule in the left adrenal gland. Questionable nodule along the right adrenal gland medial limb. 1.5 cm cortical cyst in left kidney upper pole. No hydronephrosis. No suspicious renal lesions. Bladder is distended and the top the bladder extends up to the umbilicus. Stomach/Bowel: The stomach is unremarkable. Colonic diverticulosis without acute colonic inflammation. Normal appendix. Mild wall thickening of the distal stomach is probably within normal limits. Vascular/Lymphatic: Aortic atherosclerosis without aneurysm. Incidentally, there is a circumaortic left  renal vein. Enlarged lymph nodes in the lower chest around the descending thoracic aorta and esophagus. Index lymph node on sequence 2, image 12 measures 1.4 cm. Enlarged lymph node in the left external iliac chain on sequence 2, image 62 measures 2.4 x 1.9 cm. Reproductive: Prostate is massively enlarged measuring 7.5 x 7.6 x 7.6 cm. In addition, the prostate is very nodular with large nodular components extending into the bladder base and a large nodular component along the right posterior aspect of the prostate. Other: No free fluid.  Small umbilical hernia containing fat. Musculoskeletal: Heterogeneity in the right iliac bone near the right SI joint is nonspecific. Disc space narrowing with endplate disease at M8-U1. IMPRESSION: Enlarged lymph nodes in the lower chest are compatible with metastatic disease seen on the prior chest CT. In addition, there is an indeterminate 1.2 cm left adrenal nodule which could represent metastatic disease. Question a small right adrenal lesion. Enlarged lymph node in the the left pelvis could also represent a metastatic lesion. Recommend further characterization of the lymphadenopathy and presumed metastatic disease with a PET-CT. Numerous hypodense structures throughout the liver. The largest of these lesions are compatible with cysts. Massive enlargement of the prostate with diffuse nodularity. This could represent benign prostatic hypertrophy and recommend correlation with PSA level. Urinary bladder is distended and suspect there is a component of bladder outlet obstruction associated with this prostate hypertrophy. Wedge-shaped defect in the spleen. This could represent a small splenic infarct but indeterminate. Known pulmonary emboli. Electronically Signed   By: Markus Daft M.D.   On: 06/04/2016 16:02   US Venous Img Lower Unilateral Left  Result Date: 06/03/2016 CLINICAL DATA:  Subacute onset of left lower extremity pain. Known pulmonary embolus. Initial encounter.  EXAM: LEFT LOWER EXTREMITY VENOUS DOPPLER ULTRASOUND TECHNIQUE: Gray-scale sonography with graded compression, as well as color Doppler and duplex ultrasound were performed to evaluate the lower extremity deep venous systems from the level of the common femoral vein and including the common femoral, femoral, profunda femoral, popliteal and calf veins including the posterior tibial, peroneal and gastrocnemius veins when visible. The superficial great saphenous vein was also interrogated. Spectral Doppler was utilized to evaluate flow at rest and with distal augmentation maneuvers in the common femoral, femoral and popliteal veins. COMPARISON:  None. FINDINGS: Contralateral Common Femoral Vein: Respiratory phasicity is normal and symmetric with the symptomatic side. No evidence of thrombus. Normal compressibility. Common Femoral Vein: No evidence of thrombus. Normal compressibility, respiratory phasicity and response to augmentation. Saphenofemoral Junction: No evidence of thrombus. Normal compressibility and flow on color Doppler imaging. Profunda Femoral Vein: No evidence of thrombus. Normal compressibility and flow on color Doppler imaging. Femoral Vein: No  evidence of thrombus. Normal compressibility, respiratory phasicity and response to augmentation. Popliteal Vein: No evidence of thrombus. Normal compressibility, respiratory phasicity and response to augmentation. Calf Veins: Occlusive thrombus is noted within one of the patient's left posterior tibial veins. The remaining visualized calf veins are unremarkable. Superficial Great Saphenous Vein: No evidence of thrombus. Normal compressibility and flow on color Doppler imaging. Venous Reflux:  None. Other Findings:  None. IMPRESSION: Occlusive deep venous thrombus noted within one of the patient's posterior tibial veins. These results were called by telephone at the time of interpretation on 06/03/2016 at 6:16 pm to Dr. Harvest Dark, who verbally  acknowledged these results. Electronically Signed   By: Garald Balding M.D.   On: 06/03/2016 18:19    ASSESSMENT & PLAN:   # 65 year old male patient currently admitted to the hospital for worsening shortness of breath/chest pain- noted to have bilateral PE/ mediastinal adenopathy/left upper lobe atelectasis  # Bilateral PE/left lower extremity DVT on IV heparin  # Left upper lobe atelectasis/metastatic adenopathy/right supraclavicular mild adenopathy- highly suspicious for malignancy. Recommend biopsy of the most amenable lesion for tissue diagnosis. Discussed with IR/recommend CT of the pelvis-to evaluate for any amenable lesions. Recommend pulmonary consultation for further help with tissue diagnosis.  # The above plan of care was discussed the patient and his wife in detail. Discussed with Dr.Vachani.   All questions were answered. The patient knows to call the clinic with any problems, questions or concerns.     Cammie Sickle, MD 06/04/2016 5:05 PM

## 2016-06-05 ENCOUNTER — Inpatient Hospital Stay: Payer: Managed Care, Other (non HMO)

## 2016-06-05 ENCOUNTER — Inpatient Hospital Stay (HOSPITAL_COMMUNITY)
Admit: 2016-06-05 | Discharge: 2016-06-05 | Disposition: A | Discharge status: Home / Self Care | Payer: Managed Care, Other (non HMO) | Attending: Internal Medicine | Admitting: Internal Medicine

## 2016-06-05 DIAGNOSIS — I2699 Other pulmonary embolism without acute cor pulmonale: Secondary | ICD-10-CM

## 2016-06-05 LAB — CBC
HCT: 35.8 % — ABNORMAL LOW (ref 40.0–52.0)
Hemoglobin: 12.2 g/dL — ABNORMAL LOW (ref 13.0–18.0)
MCH: 28.2 pg (ref 26.0–34.0)
MCHC: 34 g/dL (ref 32.0–36.0)
MCV: 83.1 fL (ref 80.0–100.0)
PLATELETS: 327 10*3/uL (ref 150–440)
RBC: 4.31 MIL/uL — AB (ref 4.40–5.90)
RDW: 14.4 % (ref 11.5–14.5)
WBC: 7 10*3/uL (ref 3.8–10.6)

## 2016-06-05 LAB — ECHOCARDIOGRAM COMPLETE
Height: 65 in
WEIGHTICAEL: 2320 [oz_av]

## 2016-06-05 LAB — GLUCOSE, CAPILLARY
GLUCOSE-CAPILLARY: 95 mg/dL (ref 65–99)
GLUCOSE-CAPILLARY: 107 mg/dL — AB (ref 65–99)
GLUCOSE-CAPILLARY: 95 mg/dL (ref 65–99)

## 2016-06-05 LAB — PSA: PSA: 7.63 ng/mL — ABNORMAL HIGH (ref 0.00–4.00)

## 2016-06-05 LAB — HEPARIN LEVEL (UNFRACTIONATED): Heparin Unfractionated: 0.31 IU/mL (ref 0.30–0.70)

## 2016-06-05 MED ORDER — FENTANYL CITRATE (PF) 100 MCG/2ML IJ SOLN
INTRAMUSCULAR | Status: AC
  Filled 2016-06-05: qty 4

## 2016-06-05 MED ORDER — RIVAROXABAN 15 MG PO TABS
15.0000 mg | ORAL_TABLET | Freq: Two times a day (BID) | ORAL | Status: DC
  Administered 2016-06-05 – 2016-06-06 (×2): 15 mg via ORAL
  Filled 2016-06-05 (×2): qty 1

## 2016-06-05 MED ORDER — GLUCERNA SHAKE PO LIQD
237.0000 mL | Freq: Two times a day (BID) | ORAL | Status: DC
  Administered 2016-06-06: 237 mL via ORAL

## 2016-06-05 MED ORDER — MIDAZOLAM HCL 5 MG/5ML IJ SOLN
INTRAMUSCULAR | Status: AC
  Filled 2016-06-05: qty 5

## 2016-06-05 MED ORDER — RIVAROXABAN 15 MG PO TABS: 15.0000 mg | ORAL_TABLET | Freq: Two times a day (BID) | ORAL | Status: DC

## 2016-06-05 NOTE — Progress Notes (Signed)
[facility] [place] NOTE  Patient Care Team: Roselee Nova, MD as PCP - General (Family Medicine)  CHIEF COMPLAINTS/PURPOSE OF CONSULTATION: Bilateral PE/ metastatic mediastinal adenopathy  CC:  Patient denies any bleeding. Denies any blood in stools or black stools. Shortness of breath improves. No hemoptysis.  Leg cramping also improved. Continues on IV heparin.   MEDICAL HISTORY:  Past Medical History:  Diagnosis Date  . Abnormal prostate specific antigen 08/08/2012  . Adiposity 04/16/2015  . Diabetes mellitus without complication ([place])   . Diverticulosis of sigmoid colon 04/16/2015  . Dyslipidemia 03/18/2015  . Hemorrhoids, internal 04/16/2015  . Hypercholesteremia 04/16/2015  . Hyperlipidemia   . Hypertension   . Wears dentures    partial upper    SURGICAL HISTORY: Past Surgical History:  Procedure Laterality Date  . COLONOSCOPY    . COLONOSCOPY WITH PROPOFOL N/A 05/06/2015   Procedure: COLONOSCOPY WITH PROPOFOL;  Surgeon: Lucilla Lame, MD;  Location: [facility];  Service: Endoscopy;  Laterality: N/A;  ASCENDING COLON POLYPS X 2 TERMINAL ILEUM BIOPSY RANDOM COLON BX. TRANSVERSE COLON POLYP SIGMOID COLON POLYP  . ESOPHAGOGASTRODUODENOSCOPY (EGD) WITH PROPOFOL N/A 05/06/2015   Procedure: ESOPHAGOGASTRODUODENOSCOPY (EGD) WITH PROPOFOL;  Surgeon: Lucilla Lame, MD;  Location: [facility];  Service: Endoscopy;  Laterality: N/A;  GASTRIC BIOPSY X1    SOCIAL HISTORY: Social History   Social History  . Marital status: Married    Spouse name: N/A  . Number of children: N/A  . Years of education: N/A   Occupational History  . Not on file.   Social History Main Topics  . Smoking status: Never Smoker  . Smokeless tobacco: Never Used  . Alcohol use No  . Drug use: No  . Sexual activity: Yes    Partners: Female   Other Topics Concern  . Not on file   Social History Narrative  . No narrative on file    FAMILY HISTORY: Family History   Problem Relation Age of Onset  . Diabetes Mother   . Diabetes Father   . CAD Father   . Diabetes Sister     ALLERGIES:  has No Known Allergies.  MEDICATIONS:  Current Facility-Administered Medications  Medication Dose Route Frequency Provider Last Rate Last Dose  . finasteride (PROSCAR) tablet 5 mg  5 mg Oral QHS Vaughan Basta, MD   5 mg at 06/04/16 2113  . glipiZIDE (GLUCOTROL) tablet 5 mg  5 mg Oral BID AC Vaughan Basta, MD   5 mg at 06/04/16 1747  . heparin ADULT infusion 100 units/mL (25000 units/261m sodium chloride 0.45%)  1,100 Units/hr Intravenous Continuous MLenis Noon RPH 11 mL/hr at 06/04/16 1419 1,100 Units/hr at 06/04/16 1419  . insulin aspart (novoLOG) injection 0-9 Units  0-9 Units Subcutaneous TID WC VVaughan Basta MD   2 Units at 06/04/16 1239  . lisinopril (PRINIVIL,ZESTRIL) tablet 10 mg  10 mg Oral Daily VVaughan Basta MD   10 mg at 06/04/16 2113  . metFORMIN (GLUCOPHAGE) tablet 500 mg  500 mg Oral BID WC VVaughan Basta MD   500 mg at 06/04/16 1747  . simvastatin (ZOCOR) tablet 40 mg  40 mg Oral Daily LVira Blanco RPH   40 mg at 06/04/16 2113  . sodium chloride flush (NS) 0.9 % injection 3 mL  3 mL Intravenous Q12H VVaughan Basta MD   3 mL at 06/04/16 2113  . tamsulosin (FLOMAX) capsule 0.4 mg  0.4 mg Oral BH-q7a VVaughan Basta MD   0.4  mg at 06/04/16 0630      .  PHYSICAL EXAMINATION:  Vitals:   06/04/16 2109 06/05/16 0403  BP: 114/72 103/77  Pulse: 89 80  Resp: 20 18  Temp: 98.2 F (36.8 C) 98.4 F (36.9 C)   Filed Weights   06/03/16 1606  Weight: 145 lb (65.8 kg)    GENERAL: Well-nourished well-developed; Alert, no distress and comfortable. Alone. EYES: no pallor or icterus OROPHARYNX: no thrush or ulceration. NECK: supple, no masses felt LYMPH:  no palpable lymphadenopathy in the cervical, axillary or inguinal regions LUNGS: decreased breath sounds to auscultation at bases and  No  wheeze or crackles HEART/CVS: regular rate & rhythm and no murmurs; No lower extremity edema ABDOMEN: abdomen soft, non-tender and normal bowel sounds Musculoskeletal:no cyanosis of digits and no clubbing  PSYCH: alert & oriented x 3 with fluent speech NEURO: no focal motor/sensory deficits SKIN:  no rashes or significant lesions  LABORATORY DATA:  I have reviewed the data as listed Lab Results  Component Value Date   WBC 7.0 06/05/2016   HGB 12.2 (L) 06/05/2016   HCT 35.8 (L) 06/05/2016   MCV 83.1 06/05/2016   PLT 327 06/05/2016    Recent Labs  01/01/16 1000 06/03/16 1133 06/03/16 1630 06/04/16 0103  NA 139 137 135 134*  K 4.8 4.5 4.2 4.3  CL 102 106 102 105  CO2 '21 23 25 24  '$ GLUCOSE 69 105* 126* 220*  BUN '13 15 13 14  '$ CREATININE 1.11 1.18 1.23 1.20  CALCIUM 10.1 9.7 9.6 9.4  GFRNONAA 71 >60 >60 >60  GFRAA 82 >60 >60 >60  PROT 7.2 7.8 8.1  --   ALBUMIN 4.4 3.9 4.2  --   AST '14 17 19  '$ --   ALT '24 18 19  '$ --   ALKPHOS 80 88 92  --   BILITOT 0.4 0.8 0.9  --     RADIOGRAPHIC STUDIES: I have personally reviewed the radiological images as listed and agreed with the findings in the report. Ct Angio Chest W/cm &/or Wo Cm  Result Date: 06/03/2016 CLINICAL DATA:  Severe left chest pain radiating down arm since this morning. Shortness of breath and fatigue since lithotripsy approximately 1 month ago. EXAM: CT ANGIOGRAPHY CHEST WITH CONTRAST TECHNIQUE: Multidetector CT imaging of the chest was performed using the standard protocol during bolus administration of intravenous contrast. Multiplanar CT image reconstructions and MIPs were obtained to evaluate the vascular anatomy. CONTRAST:  100 cc Isovue 370 COMPARISON:  None. FINDINGS: Cardiovascular: Multiple pulmonary emboli within the central segmental pulmonary arteries to the bilateral lower lobes, right greater than left. Several additional small pulmonary emboli within the central segmental pulmonary artery branches to the right  upper lobe. No evidence of associated right heart failure. Heart size is normal. No pericardial effusion. Thoracic aorta is normal in caliber. No aortic dissection. Mediastinum/Nodes: Numerous small and enlarged lymph nodes within the mediastinum, largest lymph nodes within the anterior mediastinum. Representative lymph node within the anterior mediastinum measures 1.9 cm short axis dimension (series 5, image 33). Additional conglomerate lymphadenopathy noted within the precarinal space measuring approximately 3.6 x 2.1 cm. Conglomerate subcarinal lymphadenopathy measures approximately 3.2 x 2.4 cm. Additional suspicious lymph nodes are seen in the right upper paratracheal region (series 5, images 14 through 23). Several mildly prominent supraclavicular lymph nodes are seen bilaterally, largest on the right measuring 1.1 cm short axis dimension (series 5, image 10). Esophagus and trachea are unremarkable. Lungs/Pleura: Dense consolidation is seen within  the anterior segment of the left upper lobe, favored to represent airspace collapse secondary to a central obstructing mass. The presumed central obstructing mass is not clearly demarcated. The bronchus to the anterior segment of the left upper lobe terminates at the proximal margin of the consolidation. Lungs otherwise clear.  No pleural effusion. Upper Abdomen: Hypodense masses are seen within the bilateral liver lobes, largest of which is in the left liver lobe demonstrating CT density measurements compatible with benign cysts (2 abutting cysts with largest component measuring 3 cm greatest dimension. A smaller lesion in the right liver lobe measures 1.6 x 1.6 cm, too small to definitively characterize. Limited images of the upper abdomen are otherwise unremarkable. Musculoskeletal: No acute or suspicious osseous finding. Superficial soft tissues are unremarkable. Review of the MIP images confirms the above findings. IMPRESSION: 1. Bilateral small pulmonary emboli  within the central segmental pulmonary arteries to the right lower lobe, right upper lobe and left lower lobe. No evidence of associated right heart failure. 2. Large dense consolidation within the anterior segment of the left upper lobe, almost certainly postobstructive airspace collapse related to a central obstructing mass. The presumed central obstructing mass, however, is not clearly demarcated but is likely at the level of the acutely occluded left upper lobe bronchus. There is additional obstruction of the adjacent left upper lobe pulmonary vein. 3. Fairly extensive mediastinal lymphadenopathy, with measurements given above. Additional mildly prominent lymph nodes within the supraclavicular regions are also suspicious for metastatic lymphadenopathy. 4. Hypodense masses within each liver lobe, largest located within the left liver lobe has the appearance of 2 abutting simple cysts based on CT density measurements. The lesion within the right liver lobe is too small to definitively characterize and requires further characterization with MRI to exclude metastasis. Critical Value/emergent results were called by telephone at the time of interpretation on 06/03/2016 at 3:30 pm to Dr. Dossie Der [facility] , who verbally acknowledged these results. Electronically Signed   By: Franki Cabot M.D.   On: 06/03/2016 15:35   Ct Abdomen Pelvis W Contrast  Result Date: 06/04/2016 CLINICAL DATA:  A recent chest CT demonstrated pulmonary emboli eye and concern for malignancy. Possible liver lesion on chest CT. EXAM: CT ABDOMEN AND PELVIS WITH CONTRAST TECHNIQUE: Multidetector CT imaging of the abdomen and pelvis was performed using the standard protocol following bolus administration of intravenous contrast. CONTRAST:  1110m ISOVUE-300 IOPAMIDOL (ISOVUE-300) INJECTION 61% COMPARISON:  Chest CTA 06/03/2016 FINDINGS: Lower chest: Lung bases are clear without pleural fluid. A few of the known pulmonary emboli are visualized at the lung  bases. Hepatobiliary: There are low-density structures scattered throughout the liver. The largest structures are water attenuation and compatible with cysts. The largest is a bilobed or slightly complex cyst near the left hepatic dome measuring up to 4.4 cm. Some of the smaller structures are too small to definitively characterize. Portal venous system is patent. Normal appearance of the gallbladder. No biliary dilatation. Pancreas: Focal fat near the junction of pancreatic head and uncinate process. Otherwise, normal appearance of the pancreas. Spleen: Peripheral low-density structure along the superior spleen measures roughly 2.2 cm. This could be related to a splenic infarct. Remainder of the spleen has a normal appearance. Adrenals/Urinary Tract: Indeterminate 1.2 cm nodule in the left adrenal gland. Questionable nodule along the right adrenal gland medial limb. 1.5 cm cortical cyst in left kidney upper pole. No hydronephrosis. No suspicious renal lesions. Bladder is distended and the top the bladder extends up to the umbilicus.  Stomach/Bowel: The stomach is unremarkable. Colonic diverticulosis without acute colonic inflammation. Normal appendix. Mild wall thickening of the distal stomach is probably within normal limits. Vascular/Lymphatic: Aortic atherosclerosis without aneurysm. Incidentally, there is a circumaortic left renal vein. Enlarged lymph nodes in the lower chest around the descending thoracic aorta and esophagus. Index lymph node on sequence 2, image 12 measures 1.4 cm. Enlarged lymph node in the left external iliac chain on sequence 2, image 62 measures 2.4 x 1.9 cm. Reproductive: Prostate is massively enlarged measuring 7.5 x 7.6 x 7.6 cm. In addition, the prostate is very nodular with large nodular components extending into the bladder base and a large nodular component along the right posterior aspect of the prostate. Other: No free fluid.  Small umbilical hernia containing fat.  Musculoskeletal: Heterogeneity in the right iliac bone near the right SI joint is nonspecific. Disc space narrowing with endplate disease at X9-J4. IMPRESSION: Enlarged lymph nodes in the lower chest are compatible with metastatic disease seen on the prior chest CT. In addition, there is an indeterminate 1.2 cm left adrenal nodule which could represent metastatic disease. Question a small right adrenal lesion. Enlarged lymph node in the the left pelvis could also represent a metastatic lesion. Recommend further characterization of the lymphadenopathy and presumed metastatic disease with a PET-CT. Numerous hypodense structures throughout the liver. The largest of these lesions are compatible with cysts. Massive enlargement of the prostate with diffuse nodularity. This could represent benign prostatic hypertrophy and recommend correlation with PSA level. Urinary bladder is distended and suspect there is a component of bladder outlet obstruction associated with this prostate hypertrophy. Wedge-shaped defect in the spleen. This could represent a small splenic infarct but indeterminate. Known pulmonary emboli. Electronically Signed   By: Markus Daft M.D.   On: 06/04/2016 16:02   US Venous Img Lower Unilateral Left  Result Date: 06/03/2016 CLINICAL DATA:  Subacute onset of left lower extremity pain. Known pulmonary embolus. Initial encounter. EXAM: LEFT LOWER EXTREMITY VENOUS DOPPLER ULTRASOUND TECHNIQUE: Gray-scale sonography with graded compression, as well as color Doppler and duplex ultrasound were performed to evaluate the lower extremity deep venous systems from the level of the common femoral vein and including the common femoral, femoral, profunda femoral, popliteal and calf veins including the posterior tibial, peroneal and gastrocnemius veins when visible. The superficial great saphenous vein was also interrogated. Spectral Doppler was utilized to evaluate flow at rest and with distal augmentation maneuvers  in the common femoral, femoral and popliteal veins. COMPARISON:  None. FINDINGS: Contralateral Common Femoral Vein: Respiratory phasicity is normal and symmetric with the symptomatic side. No evidence of thrombus. Normal compressibility. Common Femoral Vein: No evidence of thrombus. Normal compressibility, respiratory phasicity and response to augmentation. Saphenofemoral Junction: No evidence of thrombus. Normal compressibility and flow on color Doppler imaging. Profunda Femoral Vein: No evidence of thrombus. Normal compressibility and flow on color Doppler imaging. Femoral Vein: No evidence of thrombus. Normal compressibility, respiratory phasicity and response to augmentation. Popliteal Vein: No evidence of thrombus. Normal compressibility, respiratory phasicity and response to augmentation. Calf Veins: Occlusive thrombus is noted within one of the patient's left posterior tibial veins. The remaining visualized calf veins are unremarkable. Superficial Great Saphenous Vein: No evidence of thrombus. Normal compressibility and flow on color Doppler imaging. Venous Reflux:  None. Other Findings:  None. IMPRESSION: Occlusive deep venous thrombus noted within one of the patient's posterior tibial veins. These results were called by telephone at the time of interpretation on 06/03/2016 at 6:16 pm  to Dr. Harvest Dark, who verbally acknowledged these results. Electronically Signed   By: Garald Balding M.D.   On: 06/03/2016 18:19    ASSESSMENT & PLAN:   # 65 year old male patient currently admitted to the hospital for worsening shortness of breath/chest pain- noted to have bilateral PE/ mediastinal adenopathy/left upper lobe atelectasis  # Left upper lobe atelectasis/metastatic mediastinal adenopathy/right supraclavicular mild adenopathy- highly suspicious for malignancy. Discussed with radiology this morning- after reviewing the chest abdomen pelvis CT scan- the anterior mediastinal adenopathy might be the most  reasonable target for biopsy. Recommend holding IV heparin now; and also nothing by mouth moving forward. Discussed with Dr. Boyce Medici. # Also discussed with pulmonary Dr. Stevenson Clinch; pt is at high risk for bronchoscopy/ biopsy at this time- given his recent PE  # Bilateral PE/left lower extremity DVT on IV heparin- once above procedure is done; patient could be transitioned to either xarelto/ eliquis- in anticipation for discharge in the next few days.      Cammie Sickle, MD 06/05/2016 8:51 AM

## 2016-06-05 NOTE — Progress Notes (Signed)
Initial Nutrition Assessment  DOCUMENTATION CODES:   Not applicable  INTERVENTION:  -Recommend adding glucerna BID for added nutrition -Monitor intake and cater to pt preferences -Discussed high calorie, high protein, small frequent meals.     NUTRITION DIAGNOSIS:   Inadequate oral intake related to acute illness as evidenced by per patient/family report.    GOAL:   Patient will meet greater than or equal to 90% of their needs    MONITOR:   PO intake, Supplement acceptance, Weight trends  REASON FOR ASSESSMENT:   Malnutrition Screening Tool    ASSESSMENT:      65 y.o male admitted with shortness of breath, bilateral PE/mediastinal adenopathy/left upper lobe atelectasis high suspicious for malignancy and DVT of left lower extremity.  Planning biopsy this am.    Pt with history of DM, dyslipidemia, HLD, HTN  Pt reports decreased intake, eating about 50% or less of normal intake for the last 2-3 weeks prior to admission. Ate pancakes and home fries and milk for breakfast this am.   Medications reviewed: glipizide, aspart, metformin Labs reviewed: Na 134, glucose 220, hgb 12.1   Diet Order:  Diet NPO time specified  Skin:  Reviewed, no issues  Last BM:  9/27  Height:   Ht Readings from Last 1 Encounters:  06/03/16 '5\' 5"'$  (1.651 m)    Weight: Pt reports wt loss of 4 pounds in the last month (3% wt loss in the last month)  Wt Readings from Last 1 Encounters:  06/03/16 145 lb (65.8 kg)    Ideal Body Weight:     BMI:  Body mass index is 24.13 kg/m.  Estimated Nutritional Needs:   Kcal:  1625-1950 kcals/d  Protein:  78-98 g/d  Fluid:  >/= 1.6 L/d  EDUCATION NEEDS:   Education needs addressed  Semir Brill B. Zenia Resides, [place], [place] (pager) Weekend/On-Call pager [phone])

## 2016-06-05 NOTE — Progress Notes (Signed)
  Echocardiogram 2D Echocardiogram has been performed.  Jennette Dubin 06/05/2016, 9:20 AM

## 2016-06-05 NOTE — Procedures (Signed)
[place] core L supraclav LAN 18g x4 to surg path No complication No blood loss. See complete dictation in [facility].

## 2016-06-05 NOTE — Progress Notes (Addendum)
ANTICOAGULATION CONSULT NOTE - Initial Consult  Pharmacy Consult for rivaroxaban Indication: pulmonary embolus and DVT  No Known Allergies  Patient Measurements: Height: '5\' 5"'$  (165.1 cm) Weight: 145 lb (65.8 kg) IBW/kg (Calculated) : 61.5  Vital Signs: Temp: 98.7 F (37.1 C) (09/29 1211) Temp Source: Oral (09/29 1211) BP: 134/85 (09/29 1447) Pulse Rate: 93 (09/29 1211)  Labs:  Recent Labs  06/03/16 1133  06/03/16 1630 06/04/16 0103 06/04/16 0654 06/05/16 0326  HGB  --   < > 12.6* 12.1*  --  12.2*  HCT  --   --  38.0* 34.3*  --  35.8*  PLT  --   --  300 306  --  327  APTT  --   --  26  --   --   --   LABPROT  --   --  14.0  --   --   --   INR  --   --  1.08  --   --   --   HEPARINUNFRC  --   --   --  0.41 0.33 0.31  CREATININE 1.18  --  1.23 1.20  --   --   < > = values in this interval not displayed.  Estimated Creatinine Clearance: 55.5 mL/min (by C-G formula based on SCr of 1.2 mg/dL).   Medical History: Past Medical History:  Diagnosis Date  . Abnormal prostate specific antigen 08/08/2012  . Adiposity 04/16/2015  . Diabetes mellitus without complication ([place])   . Diverticulosis of sigmoid colon 04/16/2015  . Dyslipidemia 03/18/2015  . Hemorrhoids, internal 04/16/2015  . Hypercholesteremia 04/16/2015  . Hyperlipidemia   . Hypertension   . Wears dentures    partial upper    Assessment: Pharmacy consulted to dose rivaroxaban in this 65 year old male diagnosed with a PE and DVT. Patient has been on heparin drip during admission.  Per MD, heparin was held earlier today for biopsy this afternoon. Spoke with RN - patient is currently in biopsy. MD would like to start rivaroxaban and not resume heparin once biopsy is complete. RN to inform pharmacy when patient returns from biopsy so rivaroxaban can be started.  Goal of Therapy:  Monitor platelets by anticoagulation protocol: Yes   Plan:  Will order rivaroxaban 15 PO BID x 21 days followed by rivaroxaban 20 mg PO  Daily with food Discontinue heparin orders  CBC will be monitored per protocol.  Lenis Noon, PharmD, BCPS Clinical Pharmacist 06/05/2016,2:54 PM

## 2016-06-05 NOTE — Progress Notes (Signed)
ANTICOAGULATION CONSULT NOTE - Initial Consult  Pharmacy Consult for heparin drip Indication: pulmonary embolus  No Known Allergies  Patient Measurements: Height: '5\' 5"'$  (165.1 cm) Weight: 145 lb (65.8 kg) IBW/kg (Calculated) : 61.5 Heparin Dosing Weight: 66 kg  Vital Signs: Temp: 98.4 F (36.9 C) (09/29 0403) Temp Source: Oral (09/29 0403) BP: 103/77 (09/29 0403) Pulse Rate: 80 (09/29 0403)  Labs:  Recent Labs  06/03/16 1133  06/03/16 1630 06/04/16 0103 06/04/16 0654 06/05/16 0326  HGB  --   < > 12.6* 12.1*  --  12.2*  HCT  --   --  38.0* 34.3*  --  35.8*  PLT  --   --  300 306  --  327  APTT  --   --  26  --   --   --   LABPROT  --   --  14.0  --   --   --   INR  --   --  1.08  --   --   --   HEPARINUNFRC  --   --   --  0.41 0.33 0.31  CREATININE 1.18  --  1.23 1.20  --   --   < > = values in this interval not displayed.  Estimated Creatinine Clearance: 55.5 mL/min (by C-G formula based on SCr of 1.2 mg/dL).  Medical History: Past Medical History:  Diagnosis Date  . Abnormal prostate specific antigen 08/08/2012  . Adiposity 04/16/2015  . Diabetes mellitus without complication ([place])   . Diverticulosis of sigmoid colon 04/16/2015  . Dyslipidemia 03/18/2015  . Hemorrhoids, internal 04/16/2015  . Hypercholesteremia 04/16/2015  . Hyperlipidemia   . Hypertension   . Wears dentures    partial upper    Assessment: Pharmacy consulted to dose and monitor heparin drip in this 65 year old male diagnosed with pulmonary embolism. Patient denies taking any anticoagulants prior to admission.  Goal of Therapy:  Heparin level 0.3-0.7 units/ml Monitor platelets by anticoagulation protocol: Yes   Plan:  HL = 0.31 this morning. Will continue heparin drip at current rate of 1100 units/hr and recheck HL/CBC with AM labs tomorrow.  Thank you for allowing pharmacy to be involved in this patients care.  Lenis Noon, PharmD, BCPS Clinical Pharmacist 06/05/2016,8:09 AM

## 2016-06-05 NOTE — Progress Notes (Signed)
[facility] NAME: Melvin Willis    MR#:  [id number]  DATE OF BIRTH:  02-16-1951  SUBJECTIVE:  CHIEF COMPLAINT:   Chief Complaint  Patient presents with  . Shortness of Breath     Came with chest pain and SOB, found to have PE, DVT and lung mass with some involvement of LN and liver.   On heparin drip, NPO for LN biopsy today.  REVIEW OF SYSTEMS:  CONSTITUTIONAL: No fever, fatigue or weakness.  EYES: No blurred or double vision.  EARS, NOSE, AND THROAT: No tinnitus or ear pain.  RESPIRATORY: No cough, shortness of breath, wheezing or hemoptysis.  CARDIOVASCULAR: No chest pain, orthopnea, edema.  GASTROINTESTINAL: No nausea, vomiting, diarrhea or abdominal pain.  GENITOURINARY: No dysuria, hematuria.  ENDOCRINE: No polyuria, nocturia,  HEMATOLOGY: No anemia, easy bruising or bleeding SKIN: No rash or lesion. MUSCULOSKELETAL: No joint pain or arthritis.   NEUROLOGIC: No tingling, numbness, weakness.  PSYCHIATRY: No anxiety or depression.   ROS  DRUG ALLERGIES:  No Known Allergies  VITALS:  Blood pressure 134/85, pulse 93, temperature 98.7 F (37.1 C), temperature source Oral, resp. rate 20, height '5\' 5"'$  (1.651 m), weight 65.8 kg (145 lb), SpO2 99 %.  PHYSICAL EXAMINATION:  GENERAL:  65 y.o.-year-old patient lying in the bed with no acute distress.  EYES: Pupils equal, round, reactive to light and accommodation. No scleral icterus. Extraocular muscles intact.  HEENT: Head atraumatic, normocephalic. Oropharynx and nasopharynx clear.  NECK:  Supple, no jugular venous distention. No thyroid enlargement, no tenderness.  LUNGS: Normal breath sounds bilaterally, no wheezing, rales,rhonchi or crepitation. No use of accessory muscles of respiration.  CARDIOVASCULAR: S1, S2 normal. No murmurs, rubs, or gallops.  ABDOMEN: Soft, nontender, nondistended. Bowel sounds present. No organomegaly or mass.  EXTREMITIES: No pedal edema,  cyanosis, or clubbing.  NEUROLOGIC: Cranial nerves II through XII are intact. Muscle strength 5/5 in all extremities. Sensation intact. Gait not checked.  PSYCHIATRIC: The patient is alert and oriented x 3.  SKIN: No obvious rash, lesion, or ulcer.   Physical Exam LABORATORY PANEL:   CBC  Recent Labs Lab 06/05/16 0326  WBC 7.0  HGB 12.2*  HCT 35.8*  PLT 327   ------------------------------------------------------------------------------------------------------------------  Chemistries   Recent Labs Lab 06/03/16 1630 06/04/16 0103  NA 135 134*  K 4.2 4.3  CL 102 105  CO2 25 24  GLUCOSE 126* 220*  BUN 13 14  CREATININE 1.23 1.20  CALCIUM 9.6 9.4  AST 19  --   ALT 19  --   ALKPHOS 92  --   BILITOT 0.9  --    ------------------------------------------------------------------------------------------------------------------  Cardiac Enzymes No results for input(s): TROPONINI in the last 168 hours. ------------------------------------------------------------------------------------------------------------------  RADIOLOGY:  Ct Angio Chest W/cm &/or Wo Cm  Result Date: 06/03/2016 CLINICAL DATA:  Severe left chest pain radiating down arm since this morning. Shortness of breath and fatigue since lithotripsy approximately 1 month ago. EXAM: CT ANGIOGRAPHY CHEST WITH CONTRAST TECHNIQUE: Multidetector CT imaging of the chest was performed using the standard protocol during bolus administration of intravenous contrast. Multiplanar CT image reconstructions and MIPs were obtained to evaluate the vascular anatomy. CONTRAST:  100 cc Isovue 370 COMPARISON:  None. FINDINGS: Cardiovascular: Multiple pulmonary emboli within the central segmental pulmonary arteries to the bilateral lower lobes, right greater than left. Several additional small pulmonary emboli within the central segmental pulmonary artery branches to the right upper lobe. No evidence of associated right  heart failure. Heart  size is normal. No pericardial effusion. Thoracic aorta is normal in caliber. No aortic dissection. Mediastinum/Nodes: Numerous small and enlarged lymph nodes within the mediastinum, largest lymph nodes within the anterior mediastinum. Representative lymph node within the anterior mediastinum measures 1.9 cm short axis dimension (series 5, image 33). Additional conglomerate lymphadenopathy noted within the precarinal space measuring approximately 3.6 x 2.1 cm. Conglomerate subcarinal lymphadenopathy measures approximately 3.2 x 2.4 cm. Additional suspicious lymph nodes are seen in the right upper paratracheal region (series 5, images 14 through 23). Several mildly prominent supraclavicular lymph nodes are seen bilaterally, largest on the right measuring 1.1 cm short axis dimension (series 5, image 10). Esophagus and trachea are unremarkable. Lungs/Pleura: Dense consolidation is seen within the anterior segment of the left upper lobe, favored to represent airspace collapse secondary to a central obstructing mass. The presumed central obstructing mass is not clearly demarcated. The bronchus to the anterior segment of the left upper lobe terminates at the proximal margin of the consolidation. Lungs otherwise clear.  No pleural effusion. Upper Abdomen: Hypodense masses are seen within the bilateral liver lobes, largest of which is in the left liver lobe demonstrating CT density measurements compatible with benign cysts (2 abutting cysts with largest component measuring 3 cm greatest dimension. A smaller lesion in the right liver lobe measures 1.6 x 1.6 cm, too small to definitively characterize. Limited images of the upper abdomen are otherwise unremarkable. Musculoskeletal: No acute or suspicious osseous finding. Superficial soft tissues are unremarkable. Review of the MIP images confirms the above findings. IMPRESSION: 1. Bilateral small pulmonary emboli within the central segmental pulmonary arteries to the right  lower lobe, right upper lobe and left lower lobe. No evidence of associated right heart failure. 2. Large dense consolidation within the anterior segment of the left upper lobe, almost certainly postobstructive airspace collapse related to a central obstructing mass. The presumed central obstructing mass, however, is not clearly demarcated but is likely at the level of the acutely occluded left upper lobe bronchus. There is additional obstruction of the adjacent left upper lobe pulmonary vein. 3. Fairly extensive mediastinal lymphadenopathy, with measurements given above. Additional mildly prominent lymph nodes within the supraclavicular regions are also suspicious for metastatic lymphadenopathy. 4. Hypodense masses within each liver lobe, largest located within the left liver lobe has the appearance of 2 abutting simple cysts based on CT density measurements. The lesion within the right liver lobe is too small to definitively characterize and requires further characterization with MRI to exclude metastasis. Critical Value/emergent results were called by telephone at the time of interpretation on 06/03/2016 at 3:30 pm to Dr. Dossie Der [facility] , who verbally acknowledged these results. Electronically Signed   By: Franki Cabot M.D.   On: 06/03/2016 15:35   Ct Abdomen Pelvis W Contrast  Result Date: 06/04/2016 CLINICAL DATA:  A recent chest CT demonstrated pulmonary emboli eye and concern for malignancy. Possible liver lesion on chest CT. EXAM: CT ABDOMEN AND PELVIS WITH CONTRAST TECHNIQUE: Multidetector CT imaging of the abdomen and pelvis was performed using the standard protocol following bolus administration of intravenous contrast. CONTRAST:  158m ISOVUE-300 IOPAMIDOL (ISOVUE-300) INJECTION 61% COMPARISON:  Chest CTA 06/03/2016 FINDINGS: Lower chest: Lung bases are clear without pleural fluid. A few of the known pulmonary emboli are visualized at the lung bases. Hepatobiliary: There are low-density structures  scattered throughout the liver. The largest structures are water attenuation and compatible with cysts. The largest is a bilobed or slightly complex cyst  near the left hepatic dome measuring up to 4.4 cm. Some of the smaller structures are too small to definitively characterize. Portal venous system is patent. Normal appearance of the gallbladder. No biliary dilatation. Pancreas: Focal fat near the junction of pancreatic head and uncinate process. Otherwise, normal appearance of the pancreas. Spleen: Peripheral low-density structure along the superior spleen measures roughly 2.2 cm. This could be related to a splenic infarct. Remainder of the spleen has a normal appearance. Adrenals/Urinary Tract: Indeterminate 1.2 cm nodule in the left adrenal gland. Questionable nodule along the right adrenal gland medial limb. 1.5 cm cortical cyst in left kidney upper pole. No hydronephrosis. No suspicious renal lesions. Bladder is distended and the top the bladder extends up to the umbilicus. Stomach/Bowel: The stomach is unremarkable. Colonic diverticulosis without acute colonic inflammation. Normal appendix. Mild wall thickening of the distal stomach is probably within normal limits. Vascular/Lymphatic: Aortic atherosclerosis without aneurysm. Incidentally, there is a circumaortic left renal vein. Enlarged lymph nodes in the lower chest around the descending thoracic aorta and esophagus. Index lymph node on sequence 2, image 12 measures 1.4 cm. Enlarged lymph node in the left external iliac chain on sequence 2, image 62 measures 2.4 x 1.9 cm. Reproductive: Prostate is massively enlarged measuring 7.5 x 7.6 x 7.6 cm. In addition, the prostate is very nodular with large nodular components extending into the bladder base and a large nodular component along the right posterior aspect of the prostate. Other: No free fluid.  Small umbilical hernia containing fat. Musculoskeletal: Heterogeneity in the right iliac bone near the right  SI joint is nonspecific. Disc space narrowing with endplate disease at J1-O8. IMPRESSION: Enlarged lymph nodes in the lower chest are compatible with metastatic disease seen on the prior chest CT. In addition, there is an indeterminate 1.2 cm left adrenal nodule which could represent metastatic disease. Question a small right adrenal lesion. Enlarged lymph node in the the left pelvis could also represent a metastatic lesion. Recommend further characterization of the lymphadenopathy and presumed metastatic disease with a PET-CT. Numerous hypodense structures throughout the liver. The largest of these lesions are compatible with cysts. Massive enlargement of the prostate with diffuse nodularity. This could represent benign prostatic hypertrophy and recommend correlation with PSA level. Urinary bladder is distended and suspect there is a component of bladder outlet obstruction associated with this prostate hypertrophy. Wedge-shaped defect in the spleen. This could represent a small splenic infarct but indeterminate. Known pulmonary emboli. Electronically Signed   By: Markus Daft M.D.   On: 06/04/2016 16:02   US Venous Img Lower Unilateral Left  Result Date: 06/03/2016 CLINICAL DATA:  Subacute onset of left lower extremity pain. Known pulmonary embolus. Initial encounter. EXAM: LEFT LOWER EXTREMITY VENOUS DOPPLER ULTRASOUND TECHNIQUE: Gray-scale sonography with graded compression, as well as color Doppler and duplex ultrasound were performed to evaluate the lower extremity deep venous systems from the level of the common femoral vein and including the common femoral, femoral, profunda femoral, popliteal and calf veins including the posterior tibial, peroneal and gastrocnemius veins when visible. The superficial great saphenous vein was also interrogated. Spectral Doppler was utilized to evaluate flow at rest and with distal augmentation maneuvers in the common femoral, femoral and popliteal veins. COMPARISON:  None.  FINDINGS: Contralateral Common Femoral Vein: Respiratory phasicity is normal and symmetric with the symptomatic side. No evidence of thrombus. Normal compressibility. Common Femoral Vein: No evidence of thrombus. Normal compressibility, respiratory phasicity and response to augmentation. Saphenofemoral Junction: No evidence of  thrombus. Normal compressibility and flow on color Doppler imaging. Profunda Femoral Vein: No evidence of thrombus. Normal compressibility and flow on color Doppler imaging. Femoral Vein: No evidence of thrombus. Normal compressibility, respiratory phasicity and response to augmentation. Popliteal Vein: No evidence of thrombus. Normal compressibility, respiratory phasicity and response to augmentation. Calf Veins: Occlusive thrombus is noted within one of the patient's left posterior tibial veins. The remaining visualized calf veins are unremarkable. Superficial Great Saphenous Vein: No evidence of thrombus. Normal compressibility and flow on color Doppler imaging. Venous Reflux:  None. Other Findings:  None. IMPRESSION: Occlusive deep venous thrombus noted within one of the patient's posterior tibial veins. These results were called by telephone at the time of interpretation on 06/03/2016 at 6:16 pm to Dr. Harvest Dark, who verbally acknowledged these results. Electronically Signed   By: Garald Balding M.D.   On: 06/03/2016 18:19    ASSESSMENT AND PLAN:   Principal Problem:   Pulmonary embolism without acute cor pulmonale (HCC) Active Problems:   DVT (deep venous thrombosis) (HCC)   Hilar mass   Mediastinal mass  * PE and DVT   Heparin drip now.   As he need Biopsy on mass, no oral agents till then.   No signs of Rt heart strain on CT.   Get CT abdomen to better evaluate liver involvement.   CT guided LN biopsy 06/05/16- then we will start on oral xarelto and watch overnight.    Likely d/c tomorrow.  * Lung mass and mediastinal LN   Oncology and pulm consult.   May  need bronch, but as per Dr Isidore Moos- it will be as out pt due to acute PE now.     * Htn   Cont home meds  * DM   Cont home meds and on ISS.  * Hyperlipidemia   Cont statin.    All the records are reviewed and case discussed with Care Management/Social Workerr. Management plans discussed with the patient, family and they are in agreement.  CODE STATUS: Full.  TOTAL TIME TAKING CARE OF THIS PATIENT: 35 minutes.   Discussed with Dr.brahmande and his wife also in room.  POSSIBLE D/C IN 1-2 DAYS, DEPENDING ON CLINICAL CONDITION.   Melvin Willis M.D on 06/05/2016   Between 7am to 6pm - Pager - [phone]  After 6pm go to [email or web address] - password [email or web address] [facility]  Office  [phone]  CC: Primary care physician; Keith Rake, MD  Note: This dictation was prepared with Dragon dictation along with smaller phrase technology. Any transcriptional errors that result from this process are unintentional.

## 2016-06-06 LAB — CBC
HCT: 39 % — ABNORMAL LOW (ref 40.0–52.0)
Hemoglobin: 12.9 g/dL — ABNORMAL LOW (ref 13.0–18.0)
MCH: 27.7 pg (ref 26.0–34.0)
MCHC: 33.1 g/dL (ref 32.0–36.0)
MCV: 83.8 fL (ref 80.0–100.0)
PLATELETS: 344 10*3/uL (ref 150–440)
RBC: 4.65 MIL/uL (ref 4.40–5.90)
RDW: 14.3 % (ref 11.5–14.5)
WBC: 8.6 10*3/uL (ref 3.8–10.6)

## 2016-06-06 LAB — POCT CBG MONITORING: POCT Glucose (KUC): 96 mg/dL (ref 70–99)

## 2016-06-06 MED ORDER — RIVAROXABAN (XARELTO) VTE STARTER PACK (15 & 20 MG): ORAL_TABLET | ORAL | 0 refills | Status: DC

## 2016-06-06 NOTE — Progress Notes (Signed)
Discharge instructions given and went over with patient and family member at bedside. Prescription given. Follow-up appointments reviewed. Patient instructed to utilize Xarelto coupon. Patient discharged home. Madlyn Frankel, RN

## 2016-06-06 NOTE — Care Management Note (Signed)
Case Management Note  Patient Details  Name: Melvin Willis MRN: [id number] Date of Birth: 11/27/1950  Subjective/Objective:        Melvin Willis has a coupon for Melvin Willis and verbalized how to use the coupon back to this Probation officer.             Action/Plan:   Expected Discharge Date:                  Expected Discharge Plan:     In-House Referral:     Discharge planning Services     Post Acute Care Choice:    Choice offered to:     DME Arranged:    DME Agency:     HH Arranged:    HH Agency:     Status of Service:     If discussed at [employer] of Stay Meetings, dates discussed:    Additional Comments:  Melvin Willis A, RN 06/06/2016, 8:32 AM

## 2016-06-06 NOTE — Discharge Summary (Signed)
[facility] NAME: Lejend Dalby    MR#:  [id number]  DATE OF BIRTH:  [birth year]  DATE OF ADMISSION:  06/03/2016 ADMITTING PHYSICIAN: Vaughan Basta, MD  DATE OF DISCHARGE: 06/06/16  PRIMARY CARE PHYSICIAN: Keith Rake, MD    ADMISSION DIAGNOSIS:  Chest mass [R22.2] DVT (deep venous thrombosis), left [I82.402] Other acute pulmonary embolism without acute cor pulmonale (HCC) [I26.99]  DISCHARGE DIAGNOSIS:  Principal Problem:   Pulmonary embolism without acute cor pulmonale (HCC) Active Problems:   DVT (deep venous thrombosis) ([place])   Hilar mass   Mediastinal mass   SECONDARY DIAGNOSIS:   Past Medical History:  Diagnosis Date  . Abnormal prostate specific antigen 08/08/2012  . Adiposity 04/16/2015  . Diabetes mellitus without complication ([place])   . Diverticulosis of sigmoid colon 04/16/2015  . Dyslipidemia 03/18/2015  . Hemorrhoids, internal 04/16/2015  . Hypercholesteremia 04/16/2015  . Hyperlipidemia   . Hypertension   . Wears dentures    partial upper    HOSPITAL COURSE:  Rashad Auld  is a 65 y.o. male admitted 06/03/2016 with chief complaint Shortness of Breath . Please see H&P performed by Vaughan Basta, MD for further information. Patient presented with above symptoms, found to have acute pulmonary embolism in addition to multiple enlarged lymph nodes. He underwent lymph node biopsy while in the hospital and to follow with heme/onc as an outpatient  DISCHARGE CONDITIONS:   stable  CONSULTS OBTAINED:  Treatment Team:  Cammie Sickle, MD Laverle Hobby, MD  DRUG ALLERGIES:  No Known Allergies  DISCHARGE MEDICATIONS:   Current Discharge Medication List    START taking these medications   Details  Rivaroxaban 15 & 20 MG TBPK Take as directed on package: Start with one '15mg'$  tablet by mouth twice a day with food. On Day 22, switch to one '20mg'$  tablet once a day with food. Qty:  51 each, Refills: 0      CONTINUE these medications which have NOT CHANGED   Details  aspirin (BAYER ASPIRIN EC LOW DOSE) 81 MG EC tablet Take 81 mg by mouth every morning.     finasteride (PROSCAR) 5 MG tablet Take 5 mg by mouth at bedtime.     glipiZIDE (GLUCOTROL) 5 MG tablet Take 1 tablet (5 mg total) by mouth 2 (two) times daily before a meal. Qty: 180 tablet, Refills: 0   Associated Diagnoses: Type 2 diabetes mellitus without complication, without long-term current use of insulin (HCC)    lisinopril (PRINIVIL,ZESTRIL) 10 MG tablet TAKE ONE TABLET BY MOUTH ONCE DAILY Qty: 90 tablet, Refills: 0    metFORMIN (GLUCOPHAGE) 1000 MG tablet Take 0.5 tablets (500 mg total) by mouth 2 (two) times daily. Qty: 90 tablet, Refills: 0   Associated Diagnoses: Type 2 diabetes mellitus without complication, without long-term current use of insulin (HCC)    simvastatin (ZOCOR) 40 MG tablet Take 1 tablet (40 mg total) by mouth daily. Qty: 90 tablet, Refills: 0   Associated Diagnoses: Hypercholesteremia    tamsulosin (FLOMAX) 0.4 MG CAPS capsule Take 0.4 mg by mouth every morning.          DISCHARGE INSTRUCTIONS:    DIET:  Regular diet  DISCHARGE CONDITION:  Stable  ACTIVITY:  Activity as tolerated  OXYGEN:  Home Oxygen: No.   Oxygen Delivery: room air  DISCHARGE LOCATION:  home   If you experience worsening of your admission symptoms, develop shortness of breath, life threatening emergency, suicidal or homicidal thoughts  you must seek medical attention immediately by calling 911 or calling your MD immediately  if symptoms less severe.  You Must read complete instructions/literature along with all the possible adverse reactions/side effects for all the Medicines you take and that have been prescribed to you. Take any new Medicines after you have completely understood and accpet all the possible adverse reactions/side effects.   Please note  You were cared for by a hospitalist  during your hospital stay. If you have any questions about your discharge medications or the care you received while you were in the hospital after you are discharged, you can call the unit and asked to speak with the hospitalist on call if the hospitalist that took care of you is not available. Once you are discharged, your primary care physician will handle any further medical issues. Please note that NO REFILLS for any discharge medications will be authorized once you are discharged, as it is imperative that you return to your primary care physician (or establish a relationship with a primary care physician if you do not have one) for your aftercare needs so that they can reassess your need for medications and monitor your lab values.    On the day of Discharge:   VITAL SIGNS:  Blood pressure 119/63, pulse 78, temperature 98.1 F (36.7 C), temperature source Oral, resp. rate 20, height '5\' 5"'$  (1.651 m), weight 65.8 kg (145 lb), SpO2 98 %.  I/O:   Intake/Output Summary (Last 24 hours) at 06/06/16 0820 Last data filed at 06/05/16 1900  Gross per 24 hour  Intake              480 ml  Output                0 ml  Net              480 ml    PHYSICAL EXAMINATION:  GENERAL:  65 y.o.-year-old patient lying in the bed with no acute distress.  EYES: Pupils equal, round, reactive to light and accommodation. No scleral icterus. Extraocular muscles intact.  HEENT: Head atraumatic, normocephalic. Oropharynx and nasopharynx clear.  NECK:  Supple, no jugular venous distention. No thyroid enlargement, no tenderness.  LUNGS: Normal breath sounds bilaterally, no wheezing, rales,rhonchi or crepitation. No use of accessory muscles of respiration.  CARDIOVASCULAR: S1, S2 normal. No murmurs, rubs, or gallops.  ABDOMEN: Soft, non-tender, non-distended. Bowel sounds present. No organomegaly or mass.  EXTREMITIES: No pedal edema, cyanosis, or clubbing.  NEUROLOGIC: Cranial nerves II through XII are intact.  Muscle strength 5/5 in all extremities. Sensation intact. Gait not checked.  PSYCHIATRIC: The patient is alert and oriented x 3.  SKIN: No obvious rash, lesion, or ulcer.   DATA REVIEW:   CBC  Recent Labs Lab 06/06/16 0354  WBC 8.6  HGB 12.9*  HCT 39.0*  PLT 344    Chemistries   Recent Labs Lab 06/03/16 1630 06/04/16 0103  NA 135 134*  K 4.2 4.3  CL 102 105  CO2 25 24  GLUCOSE 126* 220*  BUN 13 14  CREATININE 1.23 1.20  CALCIUM 9.6 9.4  AST 19  --   ALT 19  --   ALKPHOS 92  --   BILITOT 0.9  --     Cardiac Enzymes No results for input(s): TROPONINI in the last 168 hours.  Microbiology Results  No results found for this or any previous visit.  RADIOLOGY:  Ct Abdomen Pelvis W Contrast  Result Date: 06/04/2016  CLINICAL DATA:  A recent chest CT demonstrated pulmonary emboli eye and concern for malignancy. Possible liver lesion on chest CT. EXAM: CT ABDOMEN AND PELVIS WITH CONTRAST TECHNIQUE: Multidetector CT imaging of the abdomen and pelvis was performed using the standard protocol following bolus administration of intravenous contrast. CONTRAST:  172m ISOVUE-300 IOPAMIDOL (ISOVUE-300) INJECTION 61% COMPARISON:  Chest CTA 06/03/2016 FINDINGS: Lower chest: Lung bases are clear without pleural fluid. A few of the known pulmonary emboli are visualized at the lung bases. Hepatobiliary: There are low-density structures scattered throughout the liver. The largest structures are water attenuation and compatible with cysts. The largest is a bilobed or slightly complex cyst near the left hepatic dome measuring up to 4.4 cm. Some of the smaller structures are too small to definitively characterize. Portal venous system is patent. Normal appearance of the gallbladder. No biliary dilatation. Pancreas: Focal fat near the junction of pancreatic head and uncinate process. Otherwise, normal appearance of the pancreas. Spleen: Peripheral low-density structure along the superior spleen  measures roughly 2.2 cm. This could be related to a splenic infarct. Remainder of the spleen has a normal appearance. Adrenals/Urinary Tract: Indeterminate 1.2 cm nodule in the left adrenal gland. Questionable nodule along the right adrenal gland medial limb. 1.5 cm cortical cyst in left kidney upper pole. No hydronephrosis. No suspicious renal lesions. Bladder is distended and the top the bladder extends up to the umbilicus. Stomach/Bowel: The stomach is unremarkable. Colonic diverticulosis without acute colonic inflammation. Normal appendix. Mild wall thickening of the distal stomach is probably within normal limits. Vascular/Lymphatic: Aortic atherosclerosis without aneurysm. Incidentally, there is a circumaortic left renal vein. Enlarged lymph nodes in the lower chest around the descending thoracic aorta and esophagus. Index lymph node on sequence 2, image 12 measures 1.4 cm. Enlarged lymph node in the left external iliac chain on sequence 2, image 62 measures 2.4 x 1.9 cm. Reproductive: Prostate is massively enlarged measuring 7.5 x 7.6 x 7.6 cm. In addition, the prostate is very nodular with large nodular components extending into the bladder base and a large nodular component along the right posterior aspect of the prostate. Other: No free fluid.  Small umbilical hernia containing fat. Musculoskeletal: Heterogeneity in the right iliac bone near the right SI joint is nonspecific. Disc space narrowing with endplate disease at LG2-I9 IMPRESSION: Enlarged lymph nodes in the lower chest are compatible with metastatic disease seen on the prior chest CT. In addition, there is an indeterminate 1.2 cm left adrenal nodule which could represent metastatic disease. Question a small right adrenal lesion. Enlarged lymph node in the the left pelvis could also represent a metastatic lesion. Recommend further characterization of the lymphadenopathy and presumed metastatic disease with a PET-CT. Numerous hypodense structures  throughout the liver. The largest of these lesions are compatible with cysts. Massive enlargement of the prostate with diffuse nodularity. This could represent benign prostatic hypertrophy and recommend correlation with PSA level. Urinary bladder is distended and suspect there is a component of bladder outlet obstruction associated with this prostate hypertrophy. Wedge-shaped defect in the spleen. This could represent a small splenic infarct but indeterminate. Known pulmonary emboli. Electronically Signed   By: AMarkus DaftM.D.   On: 06/04/2016 16:02   [place]  Result Date: 06/05/2016 CLINICAL DATA:  Anterior left lung mass. Mediastinal and bilateral supraclavicular adenopathy. Liver lesions. EXAM: ULTRASOUND GUIDED CORE BIOPSY OF LEFT SUPRACLAVICULAR ADENOPATHY MEDICATIONS: Lidocaine 1% subcutaneous PROCEDURE: The procedure, risks, benefits, and alternatives were explained to the patient. Questions regarding  the procedure were encouraged and answered. The patient understands and consents to the procedure. Survey ultrasound of the left supraclavicular region was performed. The pathologic adenopathy was localized and an appropriate skin entry site determined and marked. The operative field was prepped with chlorhexidine in a sterile fashion, and a sterile drape was applied covering the operative field. A sterile gown and sterile gloves were used for the procedure. Local anesthesia was provided with 1% Lidocaine. Under real-time ultrasound guidance, a 17 gauge trocar needle was advanced to the margin of the lesion. Once needle tip position was confirmed, coaxial 18-gauge core biopsy samples were obtained, submitted in saline to surgical pathology. The guide needle was removed. The patient tolerated the procedure well. COMPLICATIONS: None. FINDINGS: Left supraclavicular adenopathy was confirmed. Core biopsy samples obtained under ultrasound guidance as above. IMPRESSION: 1. Technically successful ultrasound-guided  core biopsy of left supraclavicular pathologic adenopathy. Electronically Signed   By: Lucrezia Europe M.D.   On: 06/05/2016 16:08     Management plans discussed with the patient, family and they are in agreement.  CODE STATUS:     Code Status Orders        Start     Ordered   06/03/16 2118  Full code  Continuous     06/03/16 2117    Code Status History    Date Active Date Inactive Code Status Order ID Comments User Context   06/03/2016  9:17 PM 06/05/2016  8:04 AM Full Code [id number]  Vaughan Basta, MD Inpatient      TOTAL TIME TAKING CARE OF THIS PATIENT: 33 minutes.    Kentrell Hallahan,  Karenann Cai.D on 06/06/2016 at 8:20 AM  Between 7am to 6pm - Pager - [phone]  After 6pm go to [email or web address] - Proofreader  [facility]  Office  [phone]  CC: Primary care physician; Keith Rake, MD

## 2016-06-08 ENCOUNTER — Encounter: Payer: Self-pay | Admitting: Cardiovascular Disease

## 2016-06-08 ENCOUNTER — Ambulatory Visit (INDEPENDENT_AMBULATORY_CARE_PROVIDER_SITE_OTHER): Payer: Managed Care, Other (non HMO) | Admitting: Cardiovascular Disease

## 2016-06-08 VITALS — BP 110/60 | HR 100 | Ht 64.0 in | Wt 143.0 lb

## 2016-06-08 DIAGNOSIS — R079 Chest pain, unspecified: Secondary | ICD-10-CM | POA: Diagnosis not present

## 2016-06-08 DIAGNOSIS — R0602 Shortness of breath: Secondary | ICD-10-CM

## 2016-06-08 DIAGNOSIS — I2699 Other pulmonary embolism without acute cor pulmonale: Secondary | ICD-10-CM | POA: Diagnosis not present

## 2016-06-08 NOTE — Patient Instructions (Signed)
Medication Instructions:  Your physician has recommended you make the following change in your medication:  STOP taking aspirin   Labwork: none  Testing/Procedures: none  Follow-Up: Your physician recommends that you schedule a follow-up appointment as needed.    Any Other Special Instructions Will Be Listed Below (If Applicable).     If you need a refill on your cardiac medications before your next appointment, please call your pharmacy.

## 2016-06-08 NOTE — Progress Notes (Signed)
Cardiology Office Note   Date:  06/08/2016   ID:  Melvin Willis, DOB Oct 27, 1950, MRN [id number]  PCP:  Keith Rake, MD  Cardiologist:   Kathlyn Sacramento, MD   Chief Complaint  Patient presents with  . other    Ref by Dr. Stevenson Clinch for a cardiac evaluation to discuss a mass on his lung as well as a PE. The pt. denies chest pain since the [facility] visit last week.       History of Present Illness: Melvin Willis is a 65 y.o. male who Was referred by Dr. Manuella Ghazi for evaluation of chest pain. Patient has no previous cardiac history. He saw Dr. Manuella Ghazi last week for left sided chest pain described as dull discomfort with associated cough and shortness of breath. He recently had surgery for removal of bladder stones. He was referred for CT scan of the chest which showed bilateral pulmonary emboli, large dense consultation in the anterior segment of the left lobe and extensive lymphadenopathy and liver masses. The patient was hospitalized and anticoagulated. He underwent ultrasound-guided left supraclavicular lymph node biopsy. Results are still pending. He had an echocardiogram done which showed normal LV systolic function, no significant valvular abnormalities and no evidence of pericardial effusion. Before his current illness, the patient was completely asymptomatic and physically very active with no symptoms of chest pain or shortness of breath. Referral was made to [place] before his recent diagnosis of pulmonary embolism on lung mass.    Past Medical History:  Diagnosis Date  . Abnormal prostate specific antigen 08/08/2012  . Adiposity 04/16/2015  . Diabetes mellitus without complication ([place])   . Diverticulosis of sigmoid colon 04/16/2015  . Dyslipidemia 03/18/2015  . Hemorrhoids, internal 04/16/2015  . Hypercholesteremia 04/16/2015  . Hyperlipidemia   . Hypertension   . Wears dentures    partial upper    Past Surgical History:  Procedure Laterality Date  . COLONOSCOPY    . COLONOSCOPY WITH  PROPOFOL N/A 05/06/2015   Procedure: COLONOSCOPY WITH PROPOFOL;  Surgeon: Lucilla Lame, MD;  Location: [facility];  Service: Endoscopy;  Laterality: N/A;  ASCENDING COLON POLYPS X 2 TERMINAL ILEUM BIOPSY RANDOM COLON BX. TRANSVERSE COLON POLYP SIGMOID COLON POLYP  . ESOPHAGOGASTRODUODENOSCOPY (EGD) WITH PROPOFOL N/A 05/06/2015   Procedure: ESOPHAGOGASTRODUODENOSCOPY (EGD) WITH PROPOFOL;  Surgeon: Lucilla Lame, MD;  Location: [facility];  Service: Endoscopy;  Laterality: N/A;  GASTRIC BIOPSY X1     Current Outpatient Prescriptions  Medication Sig Dispense Refill  . aspirin (BAYER ASPIRIN EC LOW DOSE) 81 MG EC tablet Take 81 mg by mouth every morning.     . finasteride (PROSCAR) 5 MG tablet Take 5 mg by mouth at bedtime.     Marland Kitchen glipiZIDE (GLUCOTROL) 5 MG tablet Take 1 tablet (5 mg total) by mouth 2 (two) times daily before a meal. 180 tablet 0  . lisinopril (PRINIVIL,ZESTRIL) 10 MG tablet TAKE ONE TABLET BY MOUTH ONCE DAILY 90 tablet 0  . metFORMIN (GLUCOPHAGE) 1000 MG tablet Take 0.5 tablets (500 mg total) by mouth 2 (two) times daily. 90 tablet 0  . Rivaroxaban 15 & 20 MG TBPK Take as directed on package: Start with one '15mg'$  tablet by mouth twice a day with food. On Day 22, switch to one '20mg'$  tablet once a day with food. 51 each 0  . simvastatin (ZOCOR) 40 MG tablet Take 1 tablet (40 mg total) by mouth daily. (Patient taking differently: Take 40 mg by mouth at bedtime. ) 90  tablet 0  . tamsulosin (FLOMAX) 0.4 MG CAPS capsule Take 0.4 mg by mouth every morning.      No current facility-administered medications for this visit.     Allergies:   Review of patient's allergies indicates no known allergies.    Social History:  The patient  reports that he has never smoked. He has never used smokeless tobacco. He reports that he does not drink alcohol or use drugs.   Family History:  The patient's family history includes CAD in his father; Diabetes in his father, mother, and  sister.    ROS:  Please see the history of present illness.   Otherwise, review of systems are positive for none.   All other systems are reviewed and negative.    PHYSICAL EXAM: VS:  BP 110/60 (BP Location: Right Arm, Patient Position: Sitting, Cuff Size: Normal)   Pulse 100   Ht '5\' 4"'$  (1.626 m)   Wt 143 lb (64.9 kg)   BMI 24.55 kg/m  , BMI Body mass index is 24.55 kg/m. GEN: Well nourished, well developed, in no acute distress  HEENT: normal  Neck: no JVD, carotid bruits, or masses Cardiac: RRR; no murmurs, rubs, or gallops,no edema  Respiratory:  clear to auscultation bilaterally, normal work of breathing GI: soft, nontender, nondistended, + BS MS: no deformity or atrophy  Skin: warm and dry, no rash Neuro:  Strength and sensation are intact Psych: euthymic mood, full affect   EKG:  EKG is ordered today. The ekg ordered today demonstrates normal sinus rhythm with no significant ST or T wave changes.  Recent Labs: 01/01/2016: TSH 0.854 06/03/2016: ALT 19 06/04/2016: BUN 14; Creatinine, Ser 1.20; Potassium 4.3; Sodium 134 06/06/2016: Hemoglobin 12.9; Platelets 344    Lipid Panel    Component Value Date/Time   CHOL 165 04/03/2016 0933   CHOL 155 10/01/2015 1020   TRIG 72 04/03/2016 0933   HDL 50 04/03/2016 0933   HDL 51 10/01/2015 1020   CHOLHDL 3.3 04/03/2016 0933   VLDL 14 04/03/2016 0933   LDLCALC 101 04/03/2016 0933   LDLCALC 89 10/01/2015 1020      Wt Readings from Last 3 Encounters:  06/08/16 143 lb (64.9 kg)  06/03/16 145 lb (65.8 kg)  06/03/16 145 lb 1.6 oz (65.8 kg)       PAD Screen 06/08/2016  Previous PAD dx? No  Previous surgical procedure? No  Pain with walking? No  Feet/toe relief with dangling? No  Painful, non-healing ulcers? No  Extremities discolored? No      ASSESSMENT AND PLAN:  1.  Chest pain: Atypical likely due to recent pulmonary embolism on lung mass. The patient has no symptoms suggestive of angina. He has been physically  active with no exertional symptoms. His EKG and echocardiogram were both unremarkable. No further cardiac workup is recommended. I advised him to follow-up with his primary care physician and oncology to discuss treatment options for what seems to be metastatic cancer.  2. Pulmonary embolism: Currently on anticoagulation with Xarelto. I discontinued aspirin.   Disposition:   FU with me as needed.   Signed,  Kathlyn Sacramento, MD  06/08/2016 9:15 AM    [place]

## 2016-06-09 LAB — GLUCOSE, CAPILLARY
GLUCOSE-CAPILLARY: 72 mg/dL (ref 65–99)
GLUCOSE-CAPILLARY: 181 mg/dL — AB (ref 65–99)
GLUCOSE-CAPILLARY: 154 mg/dL — AB (ref 65–99)
Glucose-Capillary: 218 mg/dL — ABNORMAL HIGH (ref 65–99)
Glucose-Capillary: 96 mg/dL (ref 65–99)

## 2016-06-12 ENCOUNTER — Inpatient Hospital Stay: Payer: Managed Care, Other (non HMO) | Attending: Internal Medicine | Admitting: Internal Medicine

## 2016-06-12 ENCOUNTER — Encounter: Payer: Self-pay | Admitting: Internal Medicine

## 2016-06-12 DIAGNOSIS — E7800 Pure hypercholesterolemia, unspecified: Secondary | ICD-10-CM | POA: Diagnosis not present

## 2016-06-12 DIAGNOSIS — E785 Hyperlipidemia, unspecified: Secondary | ICD-10-CM | POA: Diagnosis not present

## 2016-06-12 DIAGNOSIS — H7091 Unspecified mastoiditis, right ear: Secondary | ICD-10-CM | POA: Diagnosis not present

## 2016-06-12 DIAGNOSIS — C770 Secondary and unspecified malignant neoplasm of lymph nodes of head, face and neck: Secondary | ICD-10-CM | POA: Diagnosis not present

## 2016-06-12 DIAGNOSIS — R972 Elevated prostate specific antigen [PSA]: Secondary | ICD-10-CM | POA: Insufficient documentation

## 2016-06-12 DIAGNOSIS — K649 Unspecified hemorrhoids: Secondary | ICD-10-CM | POA: Diagnosis not present

## 2016-06-12 DIAGNOSIS — K5730 Diverticulosis of large intestine without perforation or abscess without bleeding: Secondary | ICD-10-CM | POA: Insufficient documentation

## 2016-06-12 DIAGNOSIS — I2699 Other pulmonary embolism without acute cor pulmonale: Secondary | ICD-10-CM | POA: Diagnosis not present

## 2016-06-12 DIAGNOSIS — K429 Umbilical hernia without obstruction or gangrene: Secondary | ICD-10-CM | POA: Insufficient documentation

## 2016-06-12 DIAGNOSIS — Z86711 Personal history of pulmonary embolism: Secondary | ICD-10-CM | POA: Insufficient documentation

## 2016-06-12 DIAGNOSIS — E538 Deficiency of other specified B group vitamins: Secondary | ICD-10-CM | POA: Insufficient documentation

## 2016-06-12 DIAGNOSIS — Z86010 Personal history of colonic polyps: Secondary | ICD-10-CM | POA: Diagnosis not present

## 2016-06-12 DIAGNOSIS — H538 Other visual disturbances: Secondary | ICD-10-CM | POA: Diagnosis not present

## 2016-06-12 DIAGNOSIS — Z86718 Personal history of other venous thrombosis and embolism: Secondary | ICD-10-CM | POA: Insufficient documentation

## 2016-06-12 DIAGNOSIS — Z7901 Long term (current) use of anticoagulants: Secondary | ICD-10-CM | POA: Insufficient documentation

## 2016-06-12 DIAGNOSIS — I639 Cerebral infarction, unspecified: Secondary | ICD-10-CM | POA: Diagnosis not present

## 2016-06-12 DIAGNOSIS — R590 Localized enlarged lymph nodes: Secondary | ICD-10-CM | POA: Diagnosis not present

## 2016-06-12 DIAGNOSIS — N281 Cyst of kidney, acquired: Secondary | ICD-10-CM | POA: Insufficient documentation

## 2016-06-12 DIAGNOSIS — E119 Type 2 diabetes mellitus without complications: Secondary | ICD-10-CM | POA: Diagnosis not present

## 2016-06-12 DIAGNOSIS — C3412 Malignant neoplasm of upper lobe, left bronchus or lung: Secondary | ICD-10-CM | POA: Diagnosis not present

## 2016-06-12 DIAGNOSIS — Z7984 Long term (current) use of oral hypoglycemic drugs: Secondary | ICD-10-CM | POA: Insufficient documentation

## 2016-06-12 DIAGNOSIS — N400 Enlarged prostate without lower urinary tract symptoms: Secondary | ICD-10-CM | POA: Diagnosis not present

## 2016-06-12 DIAGNOSIS — E278 Other specified disorders of adrenal gland: Secondary | ICD-10-CM | POA: Insufficient documentation

## 2016-06-12 DIAGNOSIS — Z8042 Family history of malignant neoplasm of prostate: Secondary | ICD-10-CM | POA: Insufficient documentation

## 2016-06-12 DIAGNOSIS — E042 Nontoxic multinodular goiter: Secondary | ICD-10-CM | POA: Diagnosis not present

## 2016-06-12 DIAGNOSIS — I10 Essential (primary) hypertension: Secondary | ICD-10-CM | POA: Insufficient documentation

## 2016-06-12 DIAGNOSIS — Z79899 Other long term (current) drug therapy: Secondary | ICD-10-CM | POA: Insufficient documentation

## 2016-06-12 DIAGNOSIS — Z7982 Long term (current) use of aspirin: Secondary | ICD-10-CM | POA: Insufficient documentation

## 2016-06-12 DIAGNOSIS — R0789 Other chest pain: Secondary | ICD-10-CM | POA: Insufficient documentation

## 2016-06-12 NOTE — Progress Notes (Signed)
[facility] CONSULT NOTE  Patient Care Team: Roselee Nova, MD as PCP - General (Family Medicine)  CHIEF COMPLAINTS/PURPOSE OF CONSULTATION:  Lung cancer  #  Oncology History   # OCT 2017- [place] CA LUNG; STAGE IV [; LUL; bil supraclavicular LN; Left neck LN Bx]  # LLE DVT/bil PE on xarelto  # s/p TURP [Sep 2017; Dr.Cope]   # MOLECULAR TESTING- pending     Primary cancer of left upper lobe of lung ([place])   06/12/2016 Initial Diagnosis    Primary cancer of left upper lobe of lung ([place])       HISTORY OF PRESENTING ILLNESS:  Melvin Willis 65 y.o.  male nonsmoker had recent TURP; infusion weeks later was admitted to the hospital for worsening shortness of breath and chest pain/ patient noted to have bilateral PE. Incidentally patient was also found to have left upper lobe lung mass and bilateral neck adenopathy. Left supraclavicular lymph node biopsy was positive for adenocarcinoma suggestive of lung origin.  Patient is currently accompanied by his wife. Discuss treatment options.  Shortness of breath is improved. Has been improved. No bleeding noted. No headaches or nausea no vomiting.  ROS: A complete 10 point review of system is done which is negative except mentioned above in history of present illness  MEDICAL HISTORY:  Past Medical History:  Diagnosis Date  . Abnormal prostate specific antigen 08/08/2012  . Adiposity 04/16/2015  . Diabetes mellitus without complication ([place])   . Diverticulosis of sigmoid colon 04/16/2015  . Dyslipidemia 03/18/2015  . Hemorrhoids, internal 04/16/2015  . Hypercholesteremia 04/16/2015  . Hyperlipidemia   . Hypertension   . Wears dentures    partial upper    SURGICAL HISTORY: Past Surgical History:  Procedure Laterality Date  . COLONOSCOPY    . COLONOSCOPY WITH PROPOFOL N/A 05/06/2015   Procedure: COLONOSCOPY WITH PROPOFOL;  Surgeon: Lucilla Lame, MD;  Location: [facility];  Service: Endoscopy;  Laterality:  N/A;  ASCENDING COLON POLYPS X 2 TERMINAL ILEUM BIOPSY RANDOM COLON BX. TRANSVERSE COLON POLYP SIGMOID COLON POLYP  . ESOPHAGOGASTRODUODENOSCOPY (EGD) WITH PROPOFOL N/A 05/06/2015   Procedure: ESOPHAGOGASTRODUODENOSCOPY (EGD) WITH PROPOFOL;  Surgeon: Lucilla Lame, MD;  Location: [facility];  Service: Endoscopy;  Laterality: N/A;  GASTRIC BIOPSY X1    SOCIAL HISTORY: Social History   Social History  . Marital status: Married    Spouse name: N/A  . Number of children: N/A  . Years of education: N/A   Occupational History  . Not on file.   Social History Main Topics  . Smoking status: Never Smoker  . Smokeless tobacco: Never Used  . Alcohol use No  . Drug use: No  . Sexual activity: Yes    Partners: Female   Other Topics Concern  . Not on file   Social History Narrative  . No narrative on file    FAMILY HISTORY: Family History  Problem Relation Age of Onset  . Diabetes Mother   . Diabetes Father   . CAD Father   . Diabetes Sister   . Cancer Maternal Uncle     Prostate  . Cancer Cousin     prostate    ALLERGIES:  has No Known Allergies.  MEDICATIONS:  Current Outpatient Prescriptions  Medication Sig Dispense Refill  . finasteride (PROSCAR) 5 MG tablet Take 5 mg by mouth at bedtime.     Marland Kitchen glipiZIDE (GLUCOTROL) 5 MG tablet Take 1 tablet (5 mg total) by mouth 2 (  two) times daily before a meal. 180 tablet 0  . lisinopril (PRINIVIL,ZESTRIL) 10 MG tablet TAKE ONE TABLET BY MOUTH ONCE DAILY 90 tablet 0  . metFORMIN (GLUCOPHAGE) 1000 MG tablet Take 0.5 tablets (500 mg total) by mouth 2 (two) times daily. 90 tablet 0  . Rivaroxaban 15 & 20 MG TBPK Take as directed on package: Start with one '15mg'$  tablet by mouth twice a day with food. On Day 22, switch to one '20mg'$  tablet once a day with food. 51 each 0  . simvastatin (ZOCOR) 40 MG tablet Take 1 tablet (40 mg total) by mouth daily. 90 tablet 0  . tamsulosin (FLOMAX) 0.4 MG CAPS capsule Take 0.4 mg by mouth every  morning.      No current facility-administered medications for this visit.       Marland Kitchen  PHYSICAL EXAMINATION: ECOG PERFORMANCE STATUS: 0 - Asymptomatic  Vitals:   06/12/16 1112  BP: 125/88  Pulse: (!) 108  Resp: 16  Temp: 97.8 F (36.6 C)   Filed Weights   06/12/16 1112  Weight: 144 lb (65.3 kg)    GENERAL: Well-nourished well-developed; Alert, no distress and comfortable.   With his wife.  EYES: no pallor or icterus OROPHARYNX: no thrush or ulceration; good dentition  NECK: supple, no masses felt LYMPH:  no palpable lymphadenopathy in the cervical, axillary or inguinal regions LUNGS: clear to auscultation and  No wheeze or crackles HEART/CVS: regular rate & rhythm and no murmurs; No lower extremity edema ABDOMEN: abdomen soft, non-tender and normal bowel sounds Musculoskeletal:no cyanosis of digits and no clubbing  PSYCH: alert & oriented x 3 with fluent speech NEURO: no focal motor/sensory deficits SKIN:  no rashes or significant lesions  LABORATORY DATA:  I have reviewed the data as listed Lab Results  Component Value Date   WBC 8.6 06/06/2016   HGB 12.9 (L) 06/06/2016   HCT 39.0 (L) 06/06/2016   MCV 83.8 06/06/2016   PLT 344 06/06/2016    Recent Labs  01/01/16 1000 06/03/16 1133 06/03/16 1630 06/04/16 0103  NA 139 137 135 134*  K 4.8 4.5 4.2 4.3  CL 102 106 102 105  CO2 '21 23 25 24  '$ GLUCOSE 69 105* 126* 220*  BUN '13 15 13 14  '$ CREATININE 1.11 1.18 1.23 1.20  CALCIUM 10.1 9.7 9.6 9.4  GFRNONAA 71 >60 >60 >60  GFRAA 82 >60 >60 >60  PROT 7.2 7.8 8.1  --   ALBUMIN 4.4 3.9 4.2  --   AST '14 17 19  '$ --   ALT '24 18 19  '$ --   ALKPHOS 80 88 92  --   BILITOT 0.4 0.8 0.9  --     RADIOGRAPHIC STUDIES: I have personally reviewed the radiological images as listed and agreed with the findings in the report. Ct Angio Chest W/cm &/or Wo Cm  Result Date: 06/03/2016 CLINICAL DATA:  Severe left chest pain radiating down arm since this morning. Shortness of breath  and fatigue since lithotripsy approximately 1 month ago. EXAM: CT ANGIOGRAPHY CHEST WITH CONTRAST TECHNIQUE: Multidetector CT imaging of the chest was performed using the standard protocol during bolus administration of intravenous contrast. Multiplanar CT image reconstructions and MIPs were obtained to evaluate the vascular anatomy. CONTRAST:  100 cc Isovue 370 COMPARISON:  None. FINDINGS: Cardiovascular: Multiple pulmonary emboli within the central segmental pulmonary arteries to the bilateral lower lobes, right greater than left. Several additional small pulmonary emboli within the central segmental pulmonary artery branches to the right  upper lobe. No evidence of associated right heart failure. Heart size is normal. No pericardial effusion. Thoracic aorta is normal in caliber. No aortic dissection. Mediastinum/Nodes: Numerous small and enlarged lymph nodes within the mediastinum, largest lymph nodes within the anterior mediastinum. Representative lymph node within the anterior mediastinum measures 1.9 cm short axis dimension (series 5, image 33). Additional conglomerate lymphadenopathy noted within the precarinal space measuring approximately 3.6 x 2.1 cm. Conglomerate subcarinal lymphadenopathy measures approximately 3.2 x 2.4 cm. Additional suspicious lymph nodes are seen in the right upper paratracheal region (series 5, images 14 through 23). Several mildly prominent supraclavicular lymph nodes are seen bilaterally, largest on the right measuring 1.1 cm short axis dimension (series 5, image 10). Esophagus and trachea are unremarkable. Lungs/Pleura: Dense consolidation is seen within the anterior segment of the left upper lobe, favored to represent airspace collapse secondary to a central obstructing mass. The presumed central obstructing mass is not clearly demarcated. The bronchus to the anterior segment of the left upper lobe terminates at the proximal margin of the consolidation. Lungs otherwise clear.  No  pleural effusion. Upper Abdomen: Hypodense masses are seen within the bilateral liver lobes, largest of which is in the left liver lobe demonstrating CT density measurements compatible with benign cysts (2 abutting cysts with largest component measuring 3 cm greatest dimension. A smaller lesion in the right liver lobe measures 1.6 x 1.6 cm, too small to definitively characterize. Limited images of the upper abdomen are otherwise unremarkable. Musculoskeletal: No acute or suspicious osseous finding. Superficial soft tissues are unremarkable. Review of the MIP images confirms the above findings. IMPRESSION: 1. Bilateral small pulmonary emboli within the central segmental pulmonary arteries to the right lower lobe, right upper lobe and left lower lobe. No evidence of associated right heart failure. 2. Large dense consolidation within the anterior segment of the left upper lobe, almost certainly postobstructive airspace collapse related to a central obstructing mass. The presumed central obstructing mass, however, is not clearly demarcated but is likely at the level of the acutely occluded left upper lobe bronchus. There is additional obstruction of the adjacent left upper lobe pulmonary vein. 3. Fairly extensive mediastinal lymphadenopathy, with measurements given above. Additional mildly prominent lymph nodes within the supraclavicular regions are also suspicious for metastatic lymphadenopathy. 4. Hypodense masses within each liver lobe, largest located within the left liver lobe has the appearance of 2 abutting simple cysts based on CT density measurements. The lesion within the right liver lobe is too small to definitively characterize and requires further characterization with MRI to exclude metastasis. Critical Value/emergent results were called by telephone at the time of interpretation on 06/03/2016 at 3:30 pm to Dr. Dossie Der [facility] , who verbally acknowledged these results. Electronically Signed   By: Franki Cabot  M.D.   On: 06/03/2016 15:35   Ct Abdomen Pelvis W Contrast  Result Date: 06/04/2016 CLINICAL DATA:  A recent chest CT demonstrated pulmonary emboli eye and concern for malignancy. Possible liver lesion on chest CT. EXAM: CT ABDOMEN AND PELVIS WITH CONTRAST TECHNIQUE: Multidetector CT imaging of the abdomen and pelvis was performed using the standard protocol following bolus administration of intravenous contrast. CONTRAST:  17m ISOVUE-300 IOPAMIDOL (ISOVUE-300) INJECTION 61% COMPARISON:  Chest CTA 06/03/2016 FINDINGS: Lower chest: Lung bases are clear without pleural fluid. A few of the known pulmonary emboli are visualized at the lung bases. Hepatobiliary: There are low-density structures scattered throughout the liver. The largest structures are water attenuation and compatible with cysts. The largest is  a bilobed or slightly complex cyst near the left hepatic dome measuring up to 4.4 cm. Some of the smaller structures are too small to definitively characterize. Portal venous system is patent. Normal appearance of the gallbladder. No biliary dilatation. Pancreas: Focal fat near the junction of pancreatic head and uncinate process. Otherwise, normal appearance of the pancreas. Spleen: Peripheral low-density structure along the superior spleen measures roughly 2.2 cm. This could be related to a splenic infarct. Remainder of the spleen has a normal appearance. Adrenals/Urinary Tract: Indeterminate 1.2 cm nodule in the left adrenal gland. Questionable nodule along the right adrenal gland medial limb. 1.5 cm cortical cyst in left kidney upper pole. No hydronephrosis. No suspicious renal lesions. Bladder is distended and the top the bladder extends up to the umbilicus. Stomach/Bowel: The stomach is unremarkable. Colonic diverticulosis without acute colonic inflammation. Normal appendix. Mild wall thickening of the distal stomach is probably within normal limits. Vascular/Lymphatic: Aortic atherosclerosis without  aneurysm. Incidentally, there is a circumaortic left renal vein. Enlarged lymph nodes in the lower chest around the descending thoracic aorta and esophagus. Index lymph node on sequence 2, image 12 measures 1.4 cm. Enlarged lymph node in the left external iliac chain on sequence 2, image 62 measures 2.4 x 1.9 cm. Reproductive: Prostate is massively enlarged measuring 7.5 x 7.6 x 7.6 cm. In addition, the prostate is very nodular with large nodular components extending into the bladder base and a large nodular component along the right posterior aspect of the prostate. Other: No free fluid.  Small umbilical hernia containing fat. Musculoskeletal: Heterogeneity in the right iliac bone near the right SI joint is nonspecific. Disc space narrowing with endplate disease at H6-W7. IMPRESSION: Enlarged lymph nodes in the lower chest are compatible with metastatic disease seen on the prior chest CT. In addition, there is an indeterminate 1.2 cm left adrenal nodule which could represent metastatic disease. Question a small right adrenal lesion. Enlarged lymph node in the the left pelvis could also represent a metastatic lesion. Recommend further characterization of the lymphadenopathy and presumed metastatic disease with a PET-CT. Numerous hypodense structures throughout the liver. The largest of these lesions are compatible with cysts. Massive enlargement of the prostate with diffuse nodularity. This could represent benign prostatic hypertrophy and recommend correlation with PSA level. Urinary bladder is distended and suspect there is a component of bladder outlet obstruction associated with this prostate hypertrophy. Wedge-shaped defect in the spleen. This could represent a small splenic infarct but indeterminate. Known pulmonary emboli. Electronically Signed   By: Markus Daft M.D.   On: 06/04/2016 16:02   US Biopsy  Result Date: 06/05/2016 CLINICAL DATA:  Anterior left lung mass. Mediastinal and bilateral supraclavicular  adenopathy. Liver lesions. EXAM: ULTRASOUND GUIDED CORE BIOPSY OF LEFT SUPRACLAVICULAR ADENOPATHY MEDICATIONS: Lidocaine 1% subcutaneous PROCEDURE: The procedure, risks, benefits, and alternatives were explained to the patient. Questions regarding the procedure were encouraged and answered. The patient understands and consents to the procedure. Survey ultrasound of the left supraclavicular region was performed. The pathologic adenopathy was localized and an appropriate skin entry site determined and marked. The operative field was prepped with chlorhexidine in a sterile fashion, and a sterile drape was applied covering the operative field. A sterile gown and sterile gloves were used for the procedure. Local anesthesia was provided with 1% Lidocaine. Under real-time ultrasound guidance, a 17 gauge trocar needle was advanced to the margin of the lesion. Once needle tip position was confirmed, coaxial 18-gauge core biopsy samples were obtained, submitted  in saline to surgical pathology. The guide needle was removed. The patient tolerated the procedure well. COMPLICATIONS: None. FINDINGS: Left supraclavicular adenopathy was confirmed. Core biopsy samples obtained under ultrasound guidance as above. IMPRESSION: 1. Technically successful ultrasound-guided core biopsy of left supraclavicular pathologic adenopathy. Electronically Signed   By: Lucrezia Europe M.D.   On: 06/05/2016 16:08   US Venous Img Lower Unilateral Left  Result Date: 06/03/2016 CLINICAL DATA:  Subacute onset of left lower extremity pain. Known pulmonary embolus. Initial encounter. EXAM: LEFT LOWER EXTREMITY VENOUS DOPPLER ULTRASOUND TECHNIQUE: Gray-scale sonography with graded compression, as well as color Doppler and duplex ultrasound were performed to evaluate the lower extremity deep venous systems from the level of the common femoral vein and including the common femoral, femoral, profunda femoral, popliteal and calf veins including the posterior  tibial, peroneal and gastrocnemius veins when visible. The superficial great saphenous vein was also interrogated. Spectral Doppler was utilized to evaluate flow at rest and with distal augmentation maneuvers in the common femoral, femoral and popliteal veins. COMPARISON:  None. FINDINGS: Contralateral Common Femoral Vein: Respiratory phasicity is normal and symmetric with the symptomatic side. No evidence of thrombus. Normal compressibility. Common Femoral Vein: No evidence of thrombus. Normal compressibility, respiratory phasicity and response to augmentation. Saphenofemoral Junction: No evidence of thrombus. Normal compressibility and flow on color Doppler imaging. Profunda Femoral Vein: No evidence of thrombus. Normal compressibility and flow on color Doppler imaging. Femoral Vein: No evidence of thrombus. Normal compressibility, respiratory phasicity and response to augmentation. Popliteal Vein: No evidence of thrombus. Normal compressibility, respiratory phasicity and response to augmentation. Calf Veins: Occlusive thrombus is noted within one of the patient's left posterior tibial veins. The remaining visualized calf veins are unremarkable. Superficial Great Saphenous Vein: No evidence of thrombus. Normal compressibility and flow on color Doppler imaging. Venous Reflux:  None. Other Findings:  None. IMPRESSION: Occlusive deep venous thrombus noted within one of the patient's posterior tibial veins. These results were called by telephone at the time of interpretation on 06/03/2016 at 6:16 pm to Dr. Harvest Dark, who verbally acknowledged these results. Electronically Signed   By: Garald Balding M.D.   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   # I discussed the staging/pathology with the patient back in detail. Treatments are palliative/incurable. However given  patient's fairly good health/ and oligo-metastatatic disease- patient's life expectancy should be above average.  # Of the treatment options for his cancer includes- targeted treatments including pills; immunotherapy; and chemotherapy. My recommendation for treatment based upon- monitor studies.  # Recommend MRI of the brain.  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 Patient will likely need indefinite anticoagulation.   # Patient will follow-up with me in approximately 1 week sooner based upon above workup.   All questions were answered. The patient knows to call the clinic with any problems, questions or concerns.  # 40 minutes face-to-face with the patient discussing the above plan of care; more than 50% of time spent on prognosis/ natural history; counseling and coordination.    Cammie Sickle, MD 06/12/2016 5:46 PM

## 2016-06-12 NOTE — Progress Notes (Signed)
SOB on exertion, sweating x 1 occurrence, cramping pain in back.  Recently admitted into hospital.  Had recent blood clot in left leg.  Pt reports he was sent to hospital for a mas on lungs.  Reports being a little anxious.

## 2016-06-12 NOTE — Assessment & Plan Note (Signed)
#   Adenocarcinoma of the lung metastatic/stage IV- bilateral supraclavicular adenopathy.   # I discussed the staging/pathology with the patient back in detail. Treatments are palliative/incurable. However given patient's fairly good health/ and oligo-metastatatic disease- patient's life expectancy should be above average.  # Of the treatment options for his cancer includes- targeted treatments including pills; immunotherapy; and chemotherapy. My recommendation for treatment based upon- monitor studies.  # Recommend MRI of the brain.  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 Patient will likely need indefinite anticoagulation.   # Patient will follow-up with me in approximately 1 week sooner based upon above workup.

## 2016-06-13 ENCOUNTER — Other Ambulatory Visit: Payer: Self-pay | Admitting: Family Medicine

## 2016-06-15 ENCOUNTER — Telehealth: Payer: Self-pay | Admitting: *Deleted

## 2016-06-15 NOTE — Telephone Encounter (Signed)
Asking if he should cancel dental appt Wednesday since he is on blood thinners

## 2016-06-15 NOTE — Telephone Encounter (Signed)
Advised per VO [name] to hold any dental procedures right now. Patient repeated back to me

## 2016-06-17 ENCOUNTER — Ambulatory Visit
Admission: RE | Admit: 2016-06-17 | Discharge: 2016-06-17 | Disposition: A | Discharge status: Home / Self Care | Payer: Managed Care, Other (non HMO) | Source: Ambulatory Visit | Attending: Internal Medicine | Admitting: Internal Medicine

## 2016-06-17 ENCOUNTER — Other Ambulatory Visit: Payer: Self-pay

## 2016-06-17 ENCOUNTER — Telehealth: Payer: Self-pay | Admitting: Internal Medicine

## 2016-06-17 ENCOUNTER — Observation Stay
Admission: EM | Admit: 2016-06-17 | Discharge: 2016-06-18 | Disposition: A | Discharge status: Home / Self Care | Payer: Managed Care, Other (non HMO) | Attending: Internal Medicine | Admitting: Internal Medicine

## 2016-06-17 ENCOUNTER — Encounter: Payer: Self-pay | Admitting: Emergency Medicine

## 2016-06-17 ENCOUNTER — Observation Stay: Payer: Managed Care, Other (non HMO)

## 2016-06-17 DIAGNOSIS — I2782 Chronic pulmonary embolism: Secondary | ICD-10-CM | POA: Diagnosis not present

## 2016-06-17 DIAGNOSIS — E119 Type 2 diabetes mellitus without complications: Secondary | ICD-10-CM | POA: Diagnosis not present

## 2016-06-17 DIAGNOSIS — Z79899 Other long term (current) drug therapy: Secondary | ICD-10-CM | POA: Diagnosis not present

## 2016-06-17 DIAGNOSIS — Z7901 Long term (current) use of anticoagulants: Secondary | ICD-10-CM | POA: Diagnosis not present

## 2016-06-17 DIAGNOSIS — N400 Enlarged prostate without lower urinary tract symptoms: Secondary | ICD-10-CM | POA: Insufficient documentation

## 2016-06-17 DIAGNOSIS — C3410 Malignant neoplasm of upper lobe, unspecified bronchus or lung: Secondary | ICD-10-CM | POA: Insufficient documentation

## 2016-06-17 DIAGNOSIS — K5730 Diverticulosis of large intestine without perforation or abscess without bleeding: Secondary | ICD-10-CM | POA: Insufficient documentation

## 2016-06-17 DIAGNOSIS — Z8249 Family history of ischemic heart disease and other diseases of the circulatory system: Secondary | ICD-10-CM | POA: Insufficient documentation

## 2016-06-17 DIAGNOSIS — E7800 Pure hypercholesterolemia, unspecified: Secondary | ICD-10-CM | POA: Insufficient documentation

## 2016-06-17 DIAGNOSIS — I10 Essential (primary) hypertension: Secondary | ICD-10-CM | POA: Diagnosis not present

## 2016-06-17 DIAGNOSIS — C3412 Malignant neoplasm of upper lobe, left bronchus or lung: Secondary | ICD-10-CM

## 2016-06-17 DIAGNOSIS — I639 Cerebral infarction, unspecified: Principal | ICD-10-CM | POA: Insufficient documentation

## 2016-06-17 HISTORY — DX: Cerebral infarction, unspecified: I63.9

## 2016-06-17 HISTORY — DX: Other pulmonary embolism without acute cor pulmonale: I26.99

## 2016-06-17 LAB — CBC WITH DIFFERENTIAL/PLATELET
BASOS PCT: 1 %
Basophils Absolute: 0.1 10*3/uL (ref 0–0.1)
EOS ABS: 0.2 10*3/uL (ref 0–0.7)
EOS PCT: 2 %
HCT: 36.4 % — ABNORMAL LOW (ref 40.0–52.0)
Hemoglobin: 12.2 g/dL — ABNORMAL LOW (ref 13.0–18.0)
Lymphocytes Relative: 15 %
Lymphs Abs: 1.2 10*3/uL (ref 1.0–3.6)
MCH: 27.6 pg (ref 26.0–34.0)
MCHC: 33.5 g/dL (ref 32.0–36.0)
MCV: 82.4 fL (ref 80.0–100.0)
MONO ABS: 0.6 10*3/uL (ref 0.2–1.0)
MONOS PCT: 7 %
Neutro Abs: 6.1 10*3/uL (ref 1.4–6.5)
Neutrophils Relative %: 75 %
Platelets: 364 10*3/uL (ref 150–440)
RBC: 4.41 MIL/uL (ref 4.40–5.90)
RDW: 14.2 % (ref 11.5–14.5)
WBC: 8.2 10*3/uL (ref 3.8–10.6)

## 2016-06-17 LAB — BASIC METABOLIC PANEL
Anion gap: 9 (ref 5–15)
BUN: 17 mg/dL (ref 6–20)
CO2: 24 mmol/L (ref 22–32)
CREATININE: 1.29 mg/dL — AB (ref 0.61–1.24)
Calcium: 9.9 mg/dL (ref 8.9–10.3)
Chloride: 103 mmol/L (ref 101–111)
GFR calc non Af Amer: 58 mL/min — ABNORMAL LOW (ref >60)
Glucose, Bld: 210 mg/dL — ABNORMAL HIGH (ref 65–99)
Potassium: 4.5 mmol/L (ref 3.5–5.1)
SODIUM: 136 mmol/L (ref 135–145)

## 2016-06-17 LAB — APTT: APTT: 51 s — AB (ref 24–36)

## 2016-06-17 LAB — GLUCOSE, CAPILLARY: GLUCOSE-CAPILLARY: 134 mg/dL — AB (ref 65–99)

## 2016-06-17 LAB — PROTIME-INR
INR: 2.05
Prothrombin Time: 23.4 seconds — ABNORMAL HIGH (ref 11.4–15.2)

## 2016-06-17 MED ORDER — FINASTERIDE 5 MG PO TABS
5.0000 mg | ORAL_TABLET | Freq: Every day | ORAL | Status: DC
  Administered 2016-06-17: 22:00:00 5 mg via ORAL
  Filled 2016-06-17: qty 1

## 2016-06-17 MED ORDER — LISINOPRIL 10 MG PO TABS
10.0000 mg | ORAL_TABLET | Freq: Every day | ORAL | Status: DC
  Administered 2016-06-17 – 2016-06-18 (×2): 10 mg via ORAL
  Filled 2016-06-17 (×2): qty 1

## 2016-06-17 MED ORDER — HEPARIN (PORCINE) IN NACL 100-0.45 UNIT/ML-% IJ SOLN
1300.0000 [IU]/h | INTRAMUSCULAR | Status: DC
  Administered 2016-06-17: 1200 [IU]/h via INTRAVENOUS
  Filled 2016-06-17 (×2): qty 250

## 2016-06-17 MED ORDER — GLIPIZIDE 5 MG PO TABS
5.0000 mg | ORAL_TABLET | Freq: Two times a day (BID) | ORAL | Status: DC
  Administered 2016-06-18: 08:00:00 5 mg via ORAL
  Filled 2016-06-17: qty 1

## 2016-06-17 MED ORDER — INSULIN ASPART 100 UNIT/ML ~~LOC~~ SOLN
0.0000 [IU] | Freq: Three times a day (TID) | SUBCUTANEOUS | Status: DC
  Administered 2016-06-18: 1 [IU] via SUBCUTANEOUS
  Filled 2016-06-17: qty 1

## 2016-06-17 MED ORDER — INSULIN ASPART 100 UNIT/ML ~~LOC~~ SOLN: 0.0000 [IU] | Freq: Every day | SUBCUTANEOUS | Status: DC

## 2016-06-17 MED ORDER — GADOBENATE DIMEGLUMINE 529 MG/ML IV SOLN
15.0000 mL | Freq: Once | INTRAVENOUS | Status: AC | PRN
  Administered 2016-06-17: 13 mL via INTRAVENOUS

## 2016-06-17 MED ORDER — TAMSULOSIN HCL 0.4 MG PO CAPS
0.4000 mg | ORAL_CAPSULE | ORAL | Status: DC
  Administered 2016-06-18: 08:00:00 0.4 mg via ORAL
  Filled 2016-06-17: qty 1

## 2016-06-17 MED ORDER — ACETAMINOPHEN 650 MG RE SUPP: 650.0000 mg | Freq: Four times a day (QID) | RECTAL | Status: DC | PRN

## 2016-06-17 MED ORDER — SODIUM CHLORIDE 0.9% FLUSH
3.0000 mL | Freq: Two times a day (BID) | INTRAVENOUS | Status: DC
  Administered 2016-06-17 – 2016-06-18 (×2): 3 mL via INTRAVENOUS

## 2016-06-17 MED ORDER — ONDANSETRON HCL 4 MG/2ML IJ SOLN: 4.0000 mg | Freq: Four times a day (QID) | INTRAMUSCULAR | Status: DC | PRN

## 2016-06-17 MED ORDER — IOPAMIDOL (ISOVUE-370) INJECTION 76%
75.0000 mL | Freq: Once | INTRAVENOUS | Status: AC | PRN
  Administered 2016-06-17: 75 mL via INTRAVENOUS

## 2016-06-17 MED ORDER — ONDANSETRON HCL 4 MG PO TABS: 4.0000 mg | ORAL_TABLET | Freq: Four times a day (QID) | ORAL | Status: DC | PRN

## 2016-06-17 MED ORDER — SIMVASTATIN 40 MG PO TABS
40.0000 mg | ORAL_TABLET | Freq: Every day | ORAL | Status: DC
  Administered 2016-06-17 – 2016-06-18 (×2): 40 mg via ORAL
  Filled 2016-06-17 (×2): qty 1

## 2016-06-17 MED ORDER — ACETAMINOPHEN 325 MG PO TABS: 650.0000 mg | ORAL_TABLET | Freq: Four times a day (QID) | ORAL | Status: DC | PRN

## 2016-06-17 NOTE — H&P (Signed)
[facility] [place] NAME: Melvin Willis    MR#:  [id number]  DATE OF BIRTH:  29-Dec-1950  DATE OF ADMISSION:  06/17/2016  PRIMARY CARE PHYSICIAN: Keith Rake, MD   REQUESTING/REFERRING PHYSICIAN: Dr. Nance Pear  CHIEF COMPLAINT:   Chief Complaint  Patient presents with  . Cerebrovascular Accident    HISTORY OF PRESENT ILLNESS:  Melvin Willis  is a 65 y.o. male with a known history of Attention, diabetes, hyperlipidemia, recent pulmonary embolism, lung cancer recently diagnosed, diverticulosis who presents to the hospital due to an abnormal MRI brain and noted to have acute CVA. Patient was recently diagnosed with a primary lung cancer and was getting a staging workup with MRI of the brain as an outpatient and was noted to have multiple acute/subacute infarcts or any evidence of metastatic disease. Given these findings he was referred to the ER for further evaluation. Patient denies any headache, nausea, vomiting, numbness, tingling, weakness, chest pain, shortness of breath or any other worsening symptoms.  PAST MEDICAL HISTORY:   Past Medical History:  Diagnosis Date  . Abnormal prostate specific antigen 08/08/2012  . Adiposity 04/16/2015  . CVA (cerebral vascular accident) ([place]) 06/17/2016  . Diabetes mellitus without complication ([place])   . Diverticulosis of sigmoid colon 04/16/2015  . Dyslipidemia 03/18/2015  . Hemorrhoids, internal 04/16/2015  . Hypercholesteremia 04/16/2015  . Hyperlipidemia   . Hypertension   . Pulmonary embolism ([place])   . Wears dentures    partial upper    PAST SURGICAL HISTORY:   Past Surgical History:  Procedure Laterality Date  . COLONOSCOPY    . COLONOSCOPY WITH PROPOFOL N/A 05/06/2015   Procedure: COLONOSCOPY WITH PROPOFOL;  Surgeon: Lucilla Lame, MD;  Location: [facility];  Service: Endoscopy;  Laterality: N/A;  ASCENDING COLON POLYPS X 2 TERMINAL ILEUM BIOPSY RANDOM COLON BX. TRANSVERSE  COLON POLYP SIGMOID COLON POLYP  . ESOPHAGOGASTRODUODENOSCOPY (EGD) WITH PROPOFOL N/A 05/06/2015   Procedure: ESOPHAGOGASTRODUODENOSCOPY (EGD) WITH PROPOFOL;  Surgeon: Lucilla Lame, MD;  Location: [facility];  Service: Endoscopy;  Laterality: N/A;  GASTRIC BIOPSY X1    SOCIAL HISTORY:   Social History  Substance Use Topics  . Smoking status: Never Smoker  . Smokeless tobacco: Never Used  . Alcohol use No    FAMILY HISTORY:   Family History  Problem Relation Age of Onset  . Diabetes Mother   . Diabetes Father   . CAD Father   . Dementia Father   . Diabetes Sister   . Cancer Maternal Uncle     Prostate  . Cancer Cousin     prostate    DRUG ALLERGIES:  No Known Allergies  REVIEW OF SYSTEMS:   Review of Systems  Constitutional: Negative for chills and fever.  HENT: Negative for congestion and tinnitus.   Eyes: Negative for blurred vision and double vision.  Respiratory: Negative for cough, shortness of breath and wheezing.   Cardiovascular: Negative for chest pain, orthopnea and PND.  Gastrointestinal: Negative for abdominal pain, diarrhea, nausea and vomiting.  Genitourinary: Negative for dysuria and hematuria.  Neurological: Negative for dizziness, sensory change and focal weakness.  All other systems reviewed and are negative.   MEDICATIONS AT HOME:   Prior to Admission medications   Medication Sig Start Date End Date Taking? Authorizing Provider  finasteride (PROSCAR) 5 MG tablet Take 5 mg by mouth at bedtime.  03/04/15  Yes Historical Provider, MD  glipiZIDE (GLUCOTROL) 5 MG tablet Take 1 tablet (  5 mg total) by mouth 2 (two) times daily before a meal. 04/03/16  Yes Roselee Nova, MD  lisinopril (PRINIVIL,ZESTRIL) 10 MG tablet TAKE ONE TABLET BY MOUTH ONCE DAILY 06/15/16  Yes Roselee Nova, MD  metFORMIN (GLUCOPHAGE) 1000 MG tablet Take 0.5 tablets (500 mg total) by mouth 2 (two) times daily. 04/03/16  Yes Roselee Nova, MD  simvastatin (ZOCOR) 40 MG  tablet Take 1 tablet (40 mg total) by mouth daily. 04/03/16  Yes Roselee Nova, MD  tamsulosin (FLOMAX) 0.4 MG CAPS capsule Take 0.4 mg by mouth every morning.  01/24/15  Yes Historical Provider, MD  XARELTO 15 MG TABS tablet Take 15 mg by mouth 2 (two) times daily. 06/06/16  Yes Historical Provider, MD      VITAL SIGNS:  Blood pressure 123/76, pulse 95, temperature 98.5 F (36.9 C), temperature source Oral, resp. rate 16, height '5\' 4"'$  (1.626 m), weight 65.8 kg (145 lb), SpO2 96 %.  PHYSICAL EXAMINATION:  Physical Exam  GENERAL:  65 y.o.-year-old patient lying in the bed with no acute distress.  EYES: Pupils equal, round, reactive to light and accommodation. No scleral icterus. Extraocular muscles intact.  HEENT: Head atraumatic, normocephalic. Oropharynx and nasopharynx clear. No oropharyngeal erythema, moist oral mucosa  NECK:  Supple, no jugular venous distention. No thyroid enlargement, no tenderness.  LUNGS: Normal breath sounds bilaterally, no wheezing, rales, rhonchi. No use of accessory muscles of respiration.  CARDIOVASCULAR: S1, S2 RRR. No murmurs, rubs, gallops, clicks.  ABDOMEN: Soft, nontender, nondistended. Bowel sounds present. No organomegaly or mass.  EXTREMITIES: No pedal edema, cyanosis, or clubbing. + 2 pedal & radial pulses b/l.   NEUROLOGIC: Cranial nerves II through XII are intact. No focal Motor or sensory deficits appreciated b/l PSYCHIATRIC: The patient is alert and oriented x 3. Good affect.  SKIN: No obvious rash, lesion, or ulcer.   LABORATORY PANEL:   CBC  Recent Labs Lab 06/17/16 1731  WBC 8.2  HGB 12.2*  HCT 36.4*  PLT 364   ------------------------------------------------------------------------------------------------------------------  Chemistries   Recent Labs Lab 06/17/16 1731  NA 136  K 4.5  CL 103  CO2 24  GLUCOSE 210*  BUN 17  CREATININE 1.29*  CALCIUM 9.9    ------------------------------------------------------------------------------------------------------------------  Cardiac Enzymes No results for input(s): TROPONINI in the last 168 hours. ------------------------------------------------------------------------------------------------------------------  RADIOLOGY:  Mr Jeri Cos Wo Contrast  Result Date: 06/17/2016 CLINICAL DATA:  Lung cancer staging. EXAM: MRI HEAD WITHOUT AND WITH CONTRAST TECHNIQUE: Multiplanar, multiecho pulse sequences of the brain and surrounding structures were obtained without and with intravenous contrast. CONTRAST:  42m MULTIHANCE GADOBENATE DIMEGLUMINE 529 MG/ML IV SOLN COMPARISON:  None. FINDINGS: Brain: Numerous sub cm foci of restricted diffusion throughout the bilateral cerebellum and along the bilateral cerebral convexities, anterior and posterior circulation. No hemorrhagic changes within these areas. There are 2 areas of sub cm cortical enhancement in the right frontal parietal cortex which on sagittal acquisition have a flat morphology. Favor subacute enhancing infarcts over metastases. Will need short-term follow-up scan. No hydrocephalus. Vascular: Normal flow voids. Skull and upper cervical spine: 9 mm area of T2 hyperintense and T1 hypo intense, enhancing signal abnormality in the lateral rim right orbit. Sinuses/Orbits: Right mastoid effusion with negative nasopharynx. Other: These results were called by telephone at the time of interpretation on 06/17/2016 at 2:42 pm to Dr. GCharlaine Dalton, who verbally acknowledged these results. IMPRESSION: 1. Numerous small acute infarcts in multiple vascular distributions. In this patient  with recent pulmonary embolism a PFO workup is recommended. 2. 2 subcentimeter foci of cortically based enhancement in the right cerebral hemisphere, favor subacute enhancing infarcts. Recommend follow-up scan in 2-3 months to exclude metastasis from patient's lung cancer. 3. 9 mm bone  lesion in the right lateral orbital rim, metastasis versus developmental inclusion cyst (which occur in this location). Attention on follow-up. 4. Right mastoid effusion with negative nasopharynx. Electronically Signed   By: Monte Fantasia M.D.   On: 06/17/2016 14:44     IMPRESSION AND PLAN:   65 year old male with past medical history of hypertension, diabetes, hyperlipidemia, recent diagnosis of pulmonary embolism, lung cancer who presents to the hospital due to abnormal MRI brain showing acute/subacute CVAs.  1. Acute/subacute CVA-patient's MRI of the brain shows multiple areas of acute/subacute infarcts. -These are both in anterior and also posterior circulation. -Patient's likely risk factor is his hypercoagulable state given his underlying malignancy. -Continue heparin nomogram for now, I will consult neurology. -I will get a CT angiogram of his head and neck. I will check an echocardiogram with bubble study. -Follow clinically.  2. Recent diagnosis of pulmonary embolism-currently not hypoxic or having any chest pain. -Was on Xarelto as an outpatient, continue heparin for now and can resume Xarelto once workup for CVA is done.  3. Recent diagnosis of lung cancer-continue follow-up with oncology as an outpatient.  4. Diabetes type 2 without complication-continue glipizide, sliding scale insulin. Hold metformin.  5. Essential hypertension-continue lisinopril.  6. BPH-continue Proscar, Flomax.  7. Hyperlipidemia-continue simvastatin.   All the records are reviewed and case discussed with ED provider. Management plans discussed with the patient, family and they are in agreement.  CODE STATUS: Full  TOTAL TIME TAKING CARE OF THIS PATIENT: 45 minutes.    Henreitta Leber M.D on 06/17/2016 at 7:14 PM  Between 7am to 6pm - Pager - [phone]  After 6pm go to [email or web address] - password [email or web address] [facility]  [facility]  Office  [phone]  CC: Primary care  physician; Keith Rake, MD

## 2016-06-17 NOTE — ED Provider Notes (Signed)
[facility] Emergency Department Provider Note    ____________________________________________   I have reviewed the triage vital signs and the nursing notes.   HISTORY  Chief Complaint Cerebrovascular Accident   History limited by: Not Limited   HPI Melvin Willis is a 65 y.o. male with history of lung cancer, recent diagnosis of PE who presents to the emergency department today because of outpatient MRI findings of acute stroke. The patient underwent the MRI to evaluate for brain metastases rather than for any stroke symptoms. The patient states that he has been feeling globally weak. He did state that he had one episode of weakness to his left hand, however feels like it resolved. Denies any chest pain or shortness of breath.   Past Medical History:  Diagnosis Date  . Abnormal prostate specific antigen 08/08/2012  . Adiposity 04/16/2015  . Diabetes mellitus without complication ([place])   . Diverticulosis of sigmoid colon 04/16/2015  . Dyslipidemia 03/18/2015  . Hemorrhoids, internal 04/16/2015  . Hypercholesteremia 04/16/2015  . Hyperlipidemia   . Hypertension   . Pulmonary embolism ([place])   . Wears dentures    partial upper    Patient Active Problem List   Diagnosis Date Noted  . Primary cancer of left upper lobe of lung ([place]) 06/12/2016  . Mediastinal mass   . Dyspnea 06/03/2016  . Chest pain 06/03/2016  . DVT (deep venous thrombosis) ([place]) 06/03/2016  . Pulmonary embolism without acute cor pulmonale ([place]) 06/03/2016  . Hilar mass 06/03/2016  . Pharyngeal dysphagia 01/01/2016  . Fatigue due to excessive exertion 01/01/2016  . Benign neoplasm of ascending colon   . Benign neoplasm of sigmoid colon   . Benign neoplasm of transverse colon   . Diverticulosis of large intestine without diverticulitis   . Acute esophagitis   . Gastritis   . Diverticulosis of sigmoid colon 04/16/2015  . Hypercholesteremia 04/16/2015  . Adiposity 04/16/2015  .  Diabetes mellitus without complication ([place]) 54/62/7035  . Dyslipidemia 03/18/2015  . Disorder of male genital organ 08/08/2012  . Abnormal prostate specific antigen 08/08/2012    Past Surgical History:  Procedure Laterality Date  . COLONOSCOPY    . COLONOSCOPY WITH PROPOFOL N/A 05/06/2015   Procedure: COLONOSCOPY WITH PROPOFOL;  Surgeon: Lucilla Lame, MD;  Location: [facility];  Service: Endoscopy;  Laterality: N/A;  ASCENDING COLON POLYPS X 2 TERMINAL ILEUM BIOPSY RANDOM COLON BX. TRANSVERSE COLON POLYP SIGMOID COLON POLYP  . ESOPHAGOGASTRODUODENOSCOPY (EGD) WITH PROPOFOL N/A 05/06/2015   Procedure: ESOPHAGOGASTRODUODENOSCOPY (EGD) WITH PROPOFOL;  Surgeon: Lucilla Lame, MD;  Location: [facility];  Service: Endoscopy;  Laterality: N/A;  GASTRIC BIOPSY X1    Prior to Admission medications   Medication Sig Start Date End Date Taking? Authorizing Provider  finasteride (PROSCAR) 5 MG tablet Take 5 mg by mouth at bedtime.  03/04/15  Yes Historical Provider, MD  glipiZIDE (GLUCOTROL) 5 MG tablet Take 1 tablet (5 mg total) by mouth 2 (two) times daily before a meal. 04/03/16  Yes Roselee Nova, MD  lisinopril (PRINIVIL,ZESTRIL) 10 MG tablet TAKE ONE TABLET BY MOUTH ONCE DAILY 06/15/16  Yes Roselee Nova, MD  metFORMIN (GLUCOPHAGE) 1000 MG tablet Take 0.5 tablets (500 mg total) by mouth 2 (two) times daily. 04/03/16  Yes Roselee Nova, MD  simvastatin (ZOCOR) 40 MG tablet Take 1 tablet (40 mg total) by mouth daily. 04/03/16  Yes Roselee Nova, MD  tamsulosin (FLOMAX) 0.4 MG CAPS capsule Take 0.4  mg by mouth every morning.  01/24/15  Yes Historical Provider, MD  XARELTO 15 MG TABS tablet Take 15 mg by mouth 2 (two) times daily. 06/06/16  Yes Historical Provider, MD    Allergies Review of patient's allergies indicates no known allergies.  Family History  Problem Relation Age of Onset  . Diabetes Mother   . Diabetes Father   . CAD Father   . Diabetes Sister   . Cancer  Maternal Uncle     Prostate  . Cancer Cousin     prostate    Social History Social History  Substance Use Topics  . Smoking status: Never Smoker  . Smokeless tobacco: Never Used  . Alcohol use No    Review of Systems  Constitutional: Negative for fever. Cardiovascular: Negative for chest pain. Respiratory: Negative for shortness of breath. Gastrointestinal: Negative for abdominal pain, vomiting and diarrhea. Genitourinary: Negative for dysuria. Musculoskeletal: Negative for back pain. Skin: Negative for rash. Neurological: Negative for headaches, focal weakness or numbness.  10-point ROS otherwise negative.  ____________________________________________   PHYSICAL EXAM:  VITAL SIGNS: ED Triage Vitals [06/17/16 1729]  Enc Vitals Group     BP 140/77     Pulse Rate (!) 104     Resp 16     Temp 98.5 F (36.9 C)     Temp Source Oral     SpO2 99 %     Weight 145 lb (65.8 kg)     Height '5\' 4"'$  (1.626 m)     Head Circumference      Peak Flow      Pain Score 0   Constitutional: Alert and oriented. Well appearing and in no distress. Eyes: Conjunctivae are normal. Normal extraocular movements. ENT   Head: Normocephalic and atraumatic.   Nose: No congestion/rhinnorhea.   Mouth/Throat: Mucous membranes are moist.   Neck: No stridor. Hematological/Lymphatic/Immunilogical: No cervical lymphadenopathy. Cardiovascular: Normal rate, regular rhythm.  No murmurs, rubs, or gallops. Respiratory: Normal respiratory effort without tachypnea nor retractions. Breath sounds are clear and equal bilaterally. No wheezes/rales/rhonchi. Gastrointestinal: Soft and nontender. No distention.  Genitourinary: Deferred Musculoskeletal: Normal range of motion in all extremities. No lower extremity edema. Neurologic:  Normal speech and language. No gross focal neurologic deficits are appreciated.  Skin:  Skin is warm, dry and intact. No rash noted. Psychiatric: Mood and affect are  normal. Speech and behavior are normal. Patient exhibits appropriate insight and judgment.  ____________________________________________    LABS (pertinent positives/negatives)  Labs Reviewed  CBC WITH DIFFERENTIAL/PLATELET - Abnormal; Notable for the following:       Result Value   Hemoglobin 12.2 (*)    HCT 36.4 (*)    All other components within normal limits  BASIC METABOLIC PANEL - Abnormal; Notable for the following:    Glucose, Bld 210 (*)    Creatinine, Ser 1.29 (*)    GFR calc non Af Amer 58 (*)    All other components within normal limits  PROTIME-INR - Abnormal; Notable for the following:    Prothrombin Time 23.4 (*)    All other components within normal limits  APTT  HEPARIN LEVEL (UNFRACTIONATED)     ____________________________________________   EKG  I, Nance Pear, attending physician, personally viewed and interpreted this EKG  EKG Time: 1746 Rate: 100 Rhythm: sinus tachycardia Axis: left axis deviation Intervals: qtc 422 QRS: narrow ST changes: no st elevation Impression: abnormal ekg   ____________________________________________    RADIOLOGY  MRI brain (outpatient) IMPRESSION: 1. Numerous small  acute infarcts in multiple vascular distributions. In this patient with recent pulmonary embolism a PFO workup is recommended. 2. 2 subcentimeter foci of cortically based enhancement in the right cerebral hemisphere, favor subacute enhancing infarcts. Recommend follow-up scan in 2-3 months to exclude metastasis from patient's lung cancer. 3. 9 mm bone lesion in the right lateral orbital rim, metastasis versus developmental inclusion cyst (which occur in this location). Attention on follow-up. 4. Right mastoid effusion with negative nasopharynx.  ____________________________________________   PROCEDURES  Procedures  ____________________________________________   INITIAL IMPRESSION / ASSESSMENT AND PLAN / ED COURSE  Pertinent labs &  imaging results that were available during my care of the patient were reviewed by me and considered in my medical decision making (see chart for details).  Patient sent to ER because outpatient MRI that showed multiple acute intracranial infarcts. Will start patient on heparin drip. Will have patient be admitted to the hospitalist service. ____________________________________________   FINAL CLINICAL IMPRESSION(S) / ED DIAGNOSES  Final diagnoses:  Cerebrovascular accident (CVA), unspecified mechanism ([place])     Note: This dictation was prepared with Dragon dictation. Any transcriptional errors that result from this process are unintentional    Nance Pear, MD 06/17/16 1839

## 2016-06-17 NOTE — ED Notes (Addendum)
Admitting MD at bedside.

## 2016-06-17 NOTE — Progress Notes (Signed)
ANTICOAGULATION CONSULT NOTE - Initial Consult  Pharmacy Consult for Heparin  Indication: pulmonary embolus  No Known Allergies  Patient Measurements: Height: '5\' 4"'$  (162.6 cm) Weight: 145 lb (65.8 kg) IBW/kg (Calculated) : 59.2 Heparin Dosing Weight: 65.8 kg   Vital Signs: Temp: 98.5 F (36.9 C) (10/11 1729) Temp Source: Oral (10/11 1729) BP: 140/77 (10/11 1729) Pulse Rate: 104 (10/11 1729)  Labs:  Recent Labs  06/17/16 1731  HGB 12.2*  HCT 36.4*  PLT 364    Estimated Creatinine Clearance: 53.4 mL/min (by C-G formula based on SCr of 1.2 mg/dL).   Medical History: Past Medical History:  Diagnosis Date  . Abnormal prostate specific antigen 08/08/2012  . Adiposity 04/16/2015  . Diabetes mellitus without complication ([place])   . Diverticulosis of sigmoid colon 04/16/2015  . Dyslipidemia 03/18/2015  . Hemorrhoids, internal 04/16/2015  . Hypercholesteremia 04/16/2015  . Hyperlipidemia   . Hypertension   . Pulmonary embolism ([place])   . Wears dentures    partial upper    Medications:   (Not in a hospital admission)  Assessment: Pharmacy consulted to dose heparin in this 65 year old male admitted with PE.  Pt was on Xarelto 15 mg PO BID at home, last dose taken on 10/11 @ 0800.  Goal of Therapy:  Heparin level 0.3-0.7 units/ml  APTT :  68 - 109 Monitor platelets by anticoagulation protocol: Yes   Plan:  Will not bolus this pt.  Start heparin infusion at 1200  units/hr Continue to monitor H&H and platelets  Will use aPTT to guide dosing until aPTT and HL converge.   Will check HL daily .   Zubayr Bednarczyk D 06/17/2016,6:31 PM

## 2016-06-17 NOTE — Telephone Encounter (Signed)
Spoke to the patient regarding the results of the brain MRI multiple new strokes. Patient already on Xarelto.  Recommend going to the emergency room ASAP. Paged neurologist on call; awaiting a return.

## 2016-06-17 NOTE — ED Triage Notes (Signed)
Pt here with positive infarct on MRI taken today. Pt reports recent PE, about 2 weeks ago. Pt is on xarelto. Pt reports generalized weakness.

## 2016-06-17 NOTE — ED Notes (Signed)
Pt had outpatient MRI ordered today to if recently diagnosed lung cancer had spread. Pt called by Dr and sent to ER for multiple infarcts on brain. Pt denies any stroke like sx. Pt recently started on Xarelto due to pulmonary embolism. Pt denies pain at this time.

## 2016-06-18 ENCOUNTER — Encounter: Payer: Self-pay | Admitting: Internal Medicine

## 2016-06-18 DIAGNOSIS — I639 Cerebral infarction, unspecified: Secondary | ICD-10-CM | POA: Diagnosis not present

## 2016-06-18 LAB — CBC
HCT: 34.6 % — ABNORMAL LOW (ref 40.0–52.0)
Hemoglobin: 12.1 g/dL — ABNORMAL LOW (ref 13.0–18.0)
MCH: 28.4 pg (ref 26.0–34.0)
MCHC: 34.9 g/dL (ref 32.0–36.0)
MCV: 81.5 fL (ref 80.0–100.0)
PLATELETS: 331 10*3/uL (ref 150–440)
RBC: 4.25 MIL/uL — AB (ref 4.40–5.90)
RDW: 14.1 % (ref 11.5–14.5)
WBC: 7.8 10*3/uL (ref 3.8–10.6)

## 2016-06-18 LAB — APTT
APTT: 63 s — AB (ref 24–36)
aPTT: 67 seconds — ABNORMAL HIGH (ref 24–36)

## 2016-06-18 LAB — GLUCOSE, CAPILLARY
GLUCOSE-CAPILLARY: 125 mg/dL — AB (ref 65–99)
GLUCOSE-CAPILLARY: 130 mg/dL — AB (ref 65–99)

## 2016-06-18 LAB — HEPARIN LEVEL (UNFRACTIONATED): HEPARIN UNFRACTIONATED: 1.28 [IU]/mL — AB (ref 0.30–0.70)

## 2016-06-18 MED ORDER — ATORVASTATIN CALCIUM 40 MG PO TABS: 40.0000 mg | ORAL_TABLET | Freq: Every day | ORAL | 0 refills | Status: DC

## 2016-06-18 NOTE — Progress Notes (Signed)
Pt for discharge home. Alert./ no resp distress. Sl x2 d/cd. Hep drip stopped/  Discharge instructions discussed with pt. meds discussed.  Diet / activity and f/u discussed.  Info  Regarding s/s stroke and stroke prevention given in add. To booklet on stroke earlier.verbalizes undering of discharge plans. Home via w/c at this time  With dtr w/o c/o.

## 2016-06-18 NOTE — Consult Note (Signed)
Referring Physician: Hower    Chief Complaint: Abnormal MRI  HPI: Melvin Willis is an 65 y.o. male recently diagnosed with a primary lung cancer and was getting a staging workup with MRI of the brain as an outpatient and was noted to have multiple acute/subacute infarcts without evidence of metastatic disease. Given these findings he was referred to the ER for further evaluation.  Patient reports no neurologic symptoms.  Does report though that he has had some feelings of weakness in his upper extremities at times.  Initial NIHSS of 0.   Patient on  Xarelto.  Date last known well: Unable to determine Time last known well: Unable to determine tPA Given: No: No symptoms  Past Medical History:  Diagnosis Date  . Abnormal prostate specific antigen 08/08/2012  . Adiposity 04/16/2015  . CVA (cerebral vascular accident) ([place]) 06/17/2016  . Diabetes mellitus without complication ([place])   . Diverticulosis of sigmoid colon 04/16/2015  . Dyslipidemia 03/18/2015  . Hemorrhoids, internal 04/16/2015  . Hypercholesteremia 04/16/2015  . Hyperlipidemia   . Hypertension   . Pulmonary embolism ([place])   . Wears dentures    partial upper    Past Surgical History:  Procedure Laterality Date  . COLONOSCOPY    . COLONOSCOPY WITH PROPOFOL N/A 05/06/2015   Procedure: COLONOSCOPY WITH PROPOFOL;  Surgeon: Lucilla Lame, MD;  Location: [place];  Service: Endoscopy;  Laterality: N/A;  ASCENDING COLON POLYPS X 2 TERMINAL ILEUM BIOPSY RANDOM COLON BX. TRANSVERSE COLON POLYP SIGMOID COLON POLYP  . ESOPHAGOGASTRODUODENOSCOPY (EGD) WITH PROPOFOL N/A 05/06/2015   Procedure: ESOPHAGOGASTRODUODENOSCOPY (EGD) WITH PROPOFOL;  Surgeon: Lucilla Lame, MD;  Location: [facility];  Service: Endoscopy;  Laterality: N/A;  GASTRIC BIOPSY X1    Family History  Problem Relation Age of Onset  . Diabetes Mother   . Diabetes Father   . CAD Father   . Dementia Father   . Diabetes Sister   . Cancer Maternal Uncle      Prostate  . Cancer Cousin     prostate   Social History:  reports that he has never smoked. He has never used smokeless tobacco. He reports that he does not drink alcohol or use drugs.  Allergies: No Known Allergies  Medications:  I have reviewed the patient's current medications. Prior to Admission:  Prescriptions Prior to Admission  Medication Sig Dispense Refill Last Dose  . finasteride (PROSCAR) 5 MG tablet Take 5 mg by mouth at bedtime.    06/16/2016 at 2000  . glipiZIDE (GLUCOTROL) 5 MG tablet Take 1 tablet (5 mg total) by mouth 2 (two) times daily before a meal. 180 tablet 0 06/17/2016 at 0800  . lisinopril (PRINIVIL,ZESTRIL) 10 MG tablet TAKE ONE TABLET BY MOUTH ONCE DAILY 90 tablet 0 06/17/2016 at 0800  . metFORMIN (GLUCOPHAGE) 1000 MG tablet Take 0.5 tablets (500 mg total) by mouth 2 (two) times daily. 90 tablet 0 06/17/2016 at 0800  . simvastatin (ZOCOR) 40 MG tablet Take 1 tablet (40 mg total) by mouth daily. 90 tablet 0 06/16/2016 at 1800  . tamsulosin (FLOMAX) 0.4 MG CAPS capsule Take 0.4 mg by mouth every morning.    06/17/2016 at 0800  . XARELTO 15 MG TABS tablet Take 15 mg by mouth 2 (two) times daily.   06/17/2016 at 0800   Scheduled: . finasteride  5 mg Oral QHS  . glipiZIDE  5 mg Oral BID AC  . insulin aspart  0-5 Units Subcutaneous QHS  . insulin aspart  0-9  Units Subcutaneous TID WC  . lisinopril  10 mg Oral Daily  . simvastatin  40 mg Oral Daily  . sodium chloride flush  3 mL Intravenous Q12H  . tamsulosin  0.4 mg Oral [id number]    ROS: History obtained from the patient  General ROS: negative for - chills, fatigue, fever, night sweats, weight gain or weight loss Psychological ROS: negative for - behavioral disorder, hallucinations, memory difficulties, mood swings or suicidal ideation Ophthalmic ROS: negative for - blurry vision, double vision, eye pain or loss of vision ENT ROS: negative for - epistaxis, nasal discharge, oral lesions, sore throat,  tinnitus or vertigo Allergy and Immunology ROS: negative for - hives or itchy/watery eyes Hematological and Lymphatic ROS: negative for - bleeding problems, bruising or swollen lymph nodes Endocrine ROS: negative for - galactorrhea, hair pattern changes, polydipsia/polyuria or temperature intolerance Respiratory ROS: negative for - cough, hemoptysis, shortness of breath or wheezing Cardiovascular ROS: negative for - chest pain, dyspnea on exertion, edema or irregular heartbeat Gastrointestinal ROS: negative for - abdominal pain, diarrhea, hematemesis, nausea/vomiting or stool incontinence Genito-Urinary ROS: negative for - dysuria, hematuria, incontinence or urinary frequency/urgency Musculoskeletal ROS: negative for - joint swelling or muscular weakness Neurological ROS: as noted in HPI Dermatological ROS: negative for rash and skin lesion changes  Physical Examination: Blood pressure 114/74, pulse 82, temperature 98.2 F (36.8 C), temperature source Oral, resp. rate 16, height '5\' 4"'$  (1.626 m), weight 64.7 kg (142 lb 11.2 oz), SpO2 100 %.  HEENT-  Normocephalic, no lesions, without obvious abnormality.  Normal external eye and conjunctiva.  Normal TM's bilaterally.  Normal auditory canals and external ears. Normal external nose, mucus membranes and septum.  Normal pharynx. Cardiovascular- S1, S2 normal, pulses palpable throughout   Lungs- chest clear, no wheezing, rales, normal symmetric air entry Abdomen- soft, non-tender; bowel sounds normal; no masses,  no organomegaly Extremities- no edema Lymph-no adenopathy palpable Musculoskeletal-no joint tenderness, deformity or swelling Skin-warm and dry, no hyperpigmentation, vitiligo, or suspicious lesions  Neurological Examination Mental Status: Alert, oriented, thought content appropriate.  Speech fluent without evidence of aphasia.  Able to follow 3 step commands without difficulty. Cranial Nerves: II: Discs flat bilaterally; Visual  fields grossly normal, pupils equal, round, reactive to light and accommodation III,IV, VI: ptosis not present, extra-ocular motions intact bilaterally V,VII: smile symmetric, facial light touch sensation normal bilaterally VIII: hearing normal bilaterally IX,X: gag reflex present XI: bilateral shoulder shrug XII: midline tongue extension Motor: Right : Upper extremity   5/5    Left:     Upper extremity   5/5  Lower extremity   5/5     Lower extremity   5/5 Tone and bulk:normal tone throughout; no atrophy noted Sensory: Pinprick and light touch intact throughout, bilaterally Deep Tendon Reflexes: 2+ and symmetric throughout Plantars: Right: downgoing   Left: downgoing Cerebellar: normal finger-to-nose, normal rapid alternating movements and normal heel-to-shin test Gait: normal gait and station    Laboratory Studies:  Basic Metabolic Panel:  Recent Labs Lab 06/17/16 1731  NA 136  K 4.5  CL 103  CO2 24  GLUCOSE 210*  BUN 17  CREATININE 1.29*  CALCIUM 9.9    Liver Function Tests: No results for input(s): AST, ALT, ALKPHOS, BILITOT, PROT, ALBUMIN in the last 168 hours. No results for input(s): LIPASE, AMYLASE in the last 168 hours. No results for input(s): AMMONIA in the last 168 hours.  CBC:  Recent Labs Lab 06/17/16 1731 06/18/16 0652  WBC 8.2 7.8  NEUTROABS 6.1  --   HGB 12.2* 12.1*  HCT 36.4* 34.6*  MCV 82.4 81.5  PLT 364 331    Cardiac Enzymes: No results for input(s): CKTOTAL, CKMB, CKMBINDEX, TROPONINI in the last 168 hours.  BNP: Invalid input(s): POCBNP  CBG:  Recent Labs Lab 06/17/16 2113 06/18/16 0328 06/18/16 0724  GLUCAP 134* 125* 130*    Microbiology: No results found for this or any previous visit.  Coagulation Studies:  Recent Labs  06/17/16 1731  LABPROT 23.4*  INR 2.05    Urinalysis: No results for input(s): COLORURINE, LABSPEC, PHURINE, GLUCOSEU, HGBUR, BILIRUBINUR, KETONESUR, PROTEINUR, UROBILINOGEN, NITRITE,  LEUKOCYTESUR in the last 168 hours.  Invalid input(s): APPERANCEUR  Lipid Panel:    Component Value Date/Time   CHOL 165 04/03/2016 0933   CHOL 155 10/01/2015 1020   TRIG 72 04/03/2016 0933   HDL 50 04/03/2016 0933   HDL 51 10/01/2015 1020   CHOLHDL 3.3 04/03/2016 0933   VLDL 14 04/03/2016 0933   LDLCALC 101 04/03/2016 0933   LDLCALC 89 10/01/2015 1020    HgbA1C:  Lab Results  Component Value Date   HGBA1C 7.2 04/03/2016    Urine Drug Screen:  No results found for: LABOPIA, COCAINSCRNUR, LABBENZ, AMPHETMU, THCU, LABBARB  Alcohol Level: No results for input(s): ETH in the last 168 hours.  Other results: EKG: sinus tachycardia at 100 bpm.  Imaging: Ct Angio Head W Or Wo Contrast  Result Date: 06/17/2016 CLINICAL DATA:  Stroke on MRI earlier today. EXAM: CT ANGIOGRAPHY HEAD AND NECK TECHNIQUE: Multidetector CT imaging of the head and neck was performed using the standard protocol during bolus administration of intravenous contrast. Multiplanar CT image reconstructions and MIPs were obtained to evaluate the vascular anatomy. Carotid stenosis measurements (when applicable) are obtained utilizing NASCET criteria, using the distal internal carotid diameter as the denominator. CONTRAST:  Study 5 cc Isovue 370 intravenous COMPARISON:  Brain MRI from earlier today FINDINGS: CT HEAD FINDINGS Brain: Known acute infarcts are not clearly visible. No hemorrhage, hydrocephalus, or shift. Vascular: No hyperdense vessel or unexpected calcification. Skull: 7 mm lucency in the lateral right orbital rim, which was described on brain MRI from earlier today. CT features favor a benign process. Sinuses: Right mastoiditis. Orbits: No acute finding Review of the MIP images confirms the above findings CTA NECK FINDINGS Aortic arch: Normal size and appearance.  Three vessel branching. Right carotid system: Mild atherosclerotic plaque at the carotid bifurcation without stenosis or dissection. No ulceration.  Left carotid system: Mild atherosclerotic calcification at the bifurcation. No stenosis, dissection, or ulceration. Vertebral arteries: No proximal subclavian artery stenosis. Codominant vertebral arteries are smooth and widely patent to the dura. Skeleton: No acute or aggressive finding in the neck. Other neck: Known adenopathy at the thoracic inlet and left supraclavicular fossa. Multi nodular goiter. Upper chest: Known left upper lobe malignancy with volume loss. No pulmonary emboli, partly seen on the left today. Review of the MIP images confirms the above findings CTA HEAD FINDINGS Anterior circulation: Symmetric carotid arteries. Symmetric branching. No major vessel occlusion, stenosis, or aneurysm. Posterior circulation:  Standard anatomy Venous sinuses: As permitted by contrast timing, patent. Anatomic variants: None significant Delayed phase: Posterior right frontal enhancement seen on brain MRI from earlier today may be subtly visible. Review of the MIP images confirms the above findings IMPRESSION: 1. Known multiple acute infarcts by MRI. No major vessel occlusion, flow limiting stenosis, or visible arterial embolic source. In this patient with recent pulmonary embolism, recommend workup for  PFO. 2. Mild atherosclerosis. 3. 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 Electronically Signed   By: Monte Fantasia M.D.   On: 06/17/2016 20:23   Ct Angio Neck W Or Wo Contrast  Result Date: 06/17/2016 CLINICAL DATA:  Stroke on MRI earlier today. EXAM: CT ANGIOGRAPHY HEAD AND NECK TECHNIQUE: Multidetector CT imaging of the head and neck was performed using the standard protocol during bolus administration of intravenous contrast. Multiplanar CT image reconstructions and MIPs were obtained to evaluate the vascular anatomy. Carotid stenosis measurements (when applicable) are obtained utilizing NASCET criteria, using the distal internal carotid diameter as the denominator.  CONTRAST:  Study 5 cc Isovue 370 intravenous COMPARISON:  Brain MRI from earlier today FINDINGS: CT HEAD FINDINGS Brain: Known acute infarcts are not clearly visible. No hemorrhage, hydrocephalus, or shift. Vascular: No hyperdense vessel or unexpected calcification. Skull: 7 mm lucency in the lateral right orbital rim, which was described on brain MRI from earlier today. CT features favor a benign process. Sinuses: Right mastoiditis. Orbits: No acute finding Review of the MIP images confirms the above findings CTA NECK FINDINGS Aortic arch: Normal size and appearance.  Three vessel branching. Right carotid system: Mild atherosclerotic plaque at the carotid bifurcation without stenosis or dissection. No ulceration. Left carotid system: Mild atherosclerotic calcification at the bifurcation. No stenosis, dissection, or ulceration. Vertebral arteries: No proximal subclavian artery stenosis. Codominant vertebral arteries are smooth and widely patent to the dura. Skeleton: No acute or aggressive finding in the neck. Other neck: Known adenopathy at the thoracic inlet and left supraclavicular fossa. Multi nodular goiter. Upper chest: Known left upper lobe malignancy with volume loss. No pulmonary emboli, partly seen on the left today. Review of the MIP images confirms the above findings CTA HEAD FINDINGS Anterior circulation: Symmetric carotid arteries. Symmetric branching. No major vessel occlusion, stenosis, or aneurysm. Posterior circulation:  Standard anatomy Venous sinuses: As permitted by contrast timing, patent. Anatomic variants: None significant Delayed phase: Posterior right frontal enhancement seen on brain MRI from earlier today may be subtly visible. Review of the MIP images confirms the above findings IMPRESSION: 1. Known multiple acute infarcts by MRI. No major vessel occlusion, flow limiting stenosis, or visible arterial embolic source. In this patient with recent pulmonary embolism, recommend workup for  PFO. 2. Mild atherosclerosis. 3. 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 Electronically Signed   By: Monte Fantasia M.D.   On: 06/17/2016 20:23   Mr Jeri Cos ZO Contrast  Result Date: 06/17/2016 CLINICAL DATA:  Lung cancer staging. EXAM: MRI HEAD WITHOUT AND WITH CONTRAST TECHNIQUE: Multiplanar, multiecho pulse sequences of the brain and surrounding structures were obtained without and with intravenous contrast. CONTRAST:  60m MULTIHANCE GADOBENATE DIMEGLUMINE 529 MG/ML IV SOLN COMPARISON:  None. FINDINGS: Brain: Numerous sub cm foci of restricted diffusion throughout the bilateral cerebellum and along the bilateral cerebral convexities, anterior and posterior circulation. No hemorrhagic changes within these areas. There are 2 areas of sub cm cortical enhancement in the right frontal parietal cortex which on sagittal acquisition have a flat morphology. Favor subacute enhancing infarcts over metastases. Will need short-term follow-up scan. No hydrocephalus. Vascular: Normal flow voids. Skull and upper cervical spine: 9 mm area of T2 hyperintense and T1 hypo intense, enhancing signal abnormality in the lateral rim right orbit. Sinuses/Orbits: Right mastoid effusion with negative nasopharynx. Other: These results were called by telephone at the time of interpretation on 06/17/2016 at 2:42 pm to Dr. GCharlaine Dalton,  who verbally acknowledged these results. IMPRESSION: 1. Numerous small acute infarcts in multiple vascular distributions. In this patient with recent pulmonary embolism a PFO workup is recommended. 2. 2 subcentimeter foci of cortically based enhancement in the right cerebral hemisphere, favor subacute enhancing infarcts. Recommend follow-up scan in 2-3 months to exclude metastasis from patient's lung cancer. 3. 9 mm bone lesion in the right lateral orbital rim, metastasis versus developmental inclusion cyst (which occur in this location). Attention on  follow-up. 4. Right mastoid effusion with negative nasopharynx. Electronically Signed   By: Monte Fantasia M.D.   On: 06/17/2016 14:44    Assessment: 65 y.o. male presenting after an abnormal MRI of the brain.  Patient with no neurological symptoms.  MRI of the brain personally reviewed and shows multiple small acute/subacute infarcts in multiple vascular distributions.  Patient with history of DVT on Xarelto.  Infarcts appear embolic.  Patient likely hypercoagulable due to primary cancer.   CTA show no evidence of hemodynamically significant stenosis.  Echocardiogram shows no cardiac source of emboli with an EF of 60-65%.    Stroke Risk Factors - diabetes mellitus, hyperlipidemia and hypertension  Plan: 1. Prophylactic therapy-Continue Xarelto 2. Would pursue cardiac work up further if patient is being considered to go off Xarelto at a later date to determine if ASA would be appropriate.     3. Telemetry monitoring 4. Follow up with neurology as an outpatient    Alexis Goodell, MD Neurology [phone] 06/18/2016, 12:12 PM

## 2016-06-18 NOTE — Plan of Care (Signed)
Problem: Education: Goal: Knowledge of disease or condition will improve Outcome: Adequate for Discharge Self care handbook Recovering from Stroke booklet given to pt

## 2016-06-18 NOTE — Discharge Summary (Signed)
[facility] [place] NAME: Melvin Willis    MR#:  [id number]  DATE OF BIRTH:  1951-01-12  DATE OF ADMISSION:  06/17/2016 ADMITTING PHYSICIAN: Henreitta Leber, MD  DATE OF DISCHARGE: 06/18/16  PRIMARY CARE PHYSICIAN: Keith Rake, MD    ADMISSION DIAGNOSIS:  CVA (cerebral vascular accident) [facility]) [I63.9] Cerebrovascular accident (CVA), unspecified mechanism ([place]) [I63.9]  DISCHARGE DIAGNOSIS:  CVA Abnormal MRI  SECONDARY DIAGNOSIS:   Past Medical History:  Diagnosis Date  . Abnormal prostate specific antigen 08/08/2012  . Adiposity 04/16/2015  . CVA (cerebral vascular accident) ([place]) 06/17/2016  . Diabetes mellitus without complication ([place])   . Diverticulosis of sigmoid colon 04/16/2015  . Dyslipidemia 03/18/2015  . Hemorrhoids, internal 04/16/2015  . Hypercholesteremia 04/16/2015  . Hyperlipidemia   . Hypertension   . Pulmonary embolism ([place])   . Wears dentures    partial upper    HOSPITAL COURSE:  Melvin Willis  is a 65 y.o. male admitted 06/17/2016 with chief complaint Cerebrovascular Accident . Please see H&P performed by Henreitta Leber, MD for further information. Patient presented without chief complaint to the hospital for having findings of abnormal MRI. Numerous small acute infarcts in multiple vascular distributions The MRI was performed for screening purposes for lung cancer. He has noted some upper extremity numbness which is been present for greater than one month but resolved. He was diagnosed with pulmonary embolism about 3 weeks ago started on Xarelto treatment. As he is currently asymptomatic, acute strokes consistent pattern for embolization, already on hypercoagulable treatment he is stable for discharge CT angiogram within normal limits, LDL cholesterol 100-continue high intensity statin therapy  DISCHARGE CONDITIONS:   Stable  CONSULTS OBTAINED:    DRUG ALLERGIES:  No Known Allergies  DISCHARGE  MEDICATIONS:   Current Discharge Medication List    START taking these medications   Details  atorvastatin (LIPITOR) 40 MG tablet Take 1 tablet (40 mg total) by mouth daily. Qty: 30 tablet, Refills: 0      CONTINUE these medications which have NOT CHANGED   Details  finasteride (PROSCAR) 5 MG tablet Take 5 mg by mouth at bedtime.     glipiZIDE (GLUCOTROL) 5 MG tablet Take 1 tablet (5 mg total) by mouth 2 (two) times daily before a meal. Qty: 180 tablet, Refills: 0   Associated Diagnoses: Type 2 diabetes mellitus without complication, without long-term current use of insulin (HCC)    lisinopril (PRINIVIL,ZESTRIL) 10 MG tablet TAKE ONE TABLET BY MOUTH ONCE DAILY Qty: 90 tablet, Refills: 0    metFORMIN (GLUCOPHAGE) 1000 MG tablet Take 0.5 tablets (500 mg total) by mouth 2 (two) times daily. Qty: 90 tablet, Refills: 0   Associated Diagnoses: Type 2 diabetes mellitus without complication, without long-term current use of insulin (HCC)    tamsulosin (FLOMAX) 0.4 MG CAPS capsule Take 0.4 mg by mouth every morning.     XARELTO 15 MG TABS tablet Take 15 mg by mouth 2 (two) times daily.      STOP taking these medications     simvastatin (ZOCOR) 40 MG tablet          DISCHARGE INSTRUCTIONS:    DIET:  Diabetic diet  DISCHARGE CONDITION:  Stable  ACTIVITY:  Activity as tolerated  OXYGEN:  Home Oxygen: No.   Oxygen Delivery: room air  DISCHARGE LOCATION:  home   If you experience worsening of your admission symptoms, develop shortness of breath, life threatening emergency, suicidal or homicidal  thoughts you must seek medical attention immediately by calling 911 or calling your MD immediately  if symptoms less severe.  You Must read complete instructions/literature along with all the possible adverse reactions/side effects for all the Medicines you take and that have been prescribed to you. Take any new Medicines after you have completely understood and accpet all the  possible adverse reactions/side effects.   Please note  You were cared for by a hospitalist during your hospital stay. If you have any questions about your discharge medications or the care you received while you were in the hospital after you are discharged, you can call the unit and asked to speak with the hospitalist on call if the hospitalist that took care of you is not available. Once you are discharged, your primary care physician will handle any further medical issues. Please note that NO REFILLS for any discharge medications will be authorized once you are discharged, as it is imperative that you return to your primary care physician (or establish a relationship with a primary care physician if you do not have one) for your aftercare needs so that they can reassess your need for medications and monitor your lab values.    On the day of Discharge:   VITAL SIGNS:  Blood pressure 114/74, pulse 82, temperature 98.2 F (36.8 C), temperature source Oral, resp. rate 16, height '5\' 4"'$  (1.626 m), weight 64.7 kg (142 lb 11.2 oz), SpO2 100 %.  I/O:   Intake/Output Summary (Last 24 hours) at 06/18/16 0913 Last data filed at 06/18/16 0737  Gross per 24 hour  Intake            243.6 ml  Output                0 ml  Net            243.6 ml    PHYSICAL EXAMINATION:  GENERAL:  65 y.o.-year-old patient lying in the bed with no acute distress.  EYES: Pupils equal, round, reactive to light and accommodation. No scleral icterus. Extraocular muscles intact.  HEENT: Head atraumatic, normocephalic. Oropharynx and nasopharynx clear.  NECK:  Supple, no jugular venous distention. No thyroid enlargement, no tenderness.  LUNGS: Normal breath sounds bilaterally, no wheezing, rales,rhonchi or crepitation. No use of accessory muscles of respiration.  CARDIOVASCULAR: S1, S2 normal. No murmurs, rubs, or gallops.  ABDOMEN: Soft, non-tender, non-distended. Bowel sounds present. No organomegaly or mass.    EXTREMITIES: No pedal edema, cyanosis, or clubbing.  NEUROLOGIC: Cranial nerves II through XII are intact. Muscle strength 5/5 in all extremities. Sensation intact. Gait not checked.  PSYCHIATRIC: The patient is alert and oriented x 3.  SKIN: No obvious rash, lesion, or ulcer.   DATA REVIEW:   CBC  Recent Labs Lab 06/18/16 0652  WBC 7.8  HGB 12.1*  HCT 34.6*  PLT 331    Chemistries   Recent Labs Lab 06/17/16 1731  NA 136  K 4.5  CL 103  CO2 24  GLUCOSE 210*  BUN 17  CREATININE 1.29*  CALCIUM 9.9    Cardiac Enzymes No results for input(s): TROPONINI in the last 168 hours.  Microbiology Results  No results found for this or any previous visit.  RADIOLOGY:  Ct Angio Head W Or Wo Contrast  Result Date: 06/17/2016 CLINICAL DATA:  Stroke on MRI earlier today. EXAM: CT ANGIOGRAPHY HEAD AND NECK TECHNIQUE: Multidetector CT imaging of the head and neck was performed using the standard protocol during bolus administration  of intravenous contrast. Multiplanar CT image reconstructions and MIPs were obtained to evaluate the vascular anatomy. Carotid stenosis measurements (when applicable) are obtained utilizing NASCET criteria, using the distal internal carotid diameter as the denominator. CONTRAST:  Study 5 cc Isovue 370 intravenous COMPARISON:  Brain MRI from earlier today FINDINGS: CT HEAD FINDINGS Brain: Known acute infarcts are not clearly visible. No hemorrhage, hydrocephalus, or shift. Vascular: No hyperdense vessel or unexpected calcification. Skull: 7 mm lucency in the lateral right orbital rim, which was described on brain MRI from earlier today. CT features favor a benign process. Sinuses: Right mastoiditis. Orbits: No acute finding Review of the MIP images confirms the above findings CTA NECK FINDINGS Aortic arch: Normal size and appearance.  Three vessel branching. Right carotid system: Mild atherosclerotic plaque at the carotid bifurcation without stenosis or dissection.  No ulceration. Left carotid system: Mild atherosclerotic calcification at the bifurcation. No stenosis, dissection, or ulceration. Vertebral arteries: No proximal subclavian artery stenosis. Codominant vertebral arteries are smooth and widely patent to the dura. Skeleton: No acute or aggressive finding in the neck. Other neck: Known adenopathy at the thoracic inlet and left supraclavicular fossa. Multi nodular goiter. Upper chest: Known left upper lobe malignancy with volume loss. No pulmonary emboli, partly seen on the left today. Review of the MIP images confirms the above findings CTA HEAD FINDINGS Anterior circulation: Symmetric carotid arteries. Symmetric branching. No major vessel occlusion, stenosis, or aneurysm. Posterior circulation:  Standard anatomy Venous sinuses: As permitted by contrast timing, patent. Anatomic variants: None significant Delayed phase: Posterior right frontal enhancement seen on brain MRI from earlier today may be subtly visible. Review of the MIP images confirms the above findings IMPRESSION: 1. Known multiple acute infarcts by MRI. No major vessel occlusion, flow limiting stenosis, or visible arterial embolic source. In this patient with recent pulmonary embolism, recommend workup for PFO. 2. Mild atherosclerosis. 3. 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 Electronically Signed   By: Monte Fantasia M.D.   On: 06/17/2016 20:23   Ct Angio Neck W Or Wo Contrast  Result Date: 06/17/2016 CLINICAL DATA:  Stroke on MRI earlier today. EXAM: CT ANGIOGRAPHY HEAD AND NECK TECHNIQUE: Multidetector CT imaging of the head and neck was performed using the standard protocol during bolus administration of intravenous contrast. Multiplanar CT image reconstructions and MIPs were obtained to evaluate the vascular anatomy. Carotid stenosis measurements (when applicable) are obtained utilizing NASCET criteria, using the distal internal carotid diameter as  the denominator. CONTRAST:  Study 5 cc Isovue 370 intravenous COMPARISON:  Brain MRI from earlier today FINDINGS: CT HEAD FINDINGS Brain: Known acute infarcts are not clearly visible. No hemorrhage, hydrocephalus, or shift. Vascular: No hyperdense vessel or unexpected calcification. Skull: 7 mm lucency in the lateral right orbital rim, which was described on brain MRI from earlier today. CT features favor a benign process. Sinuses: Right mastoiditis. Orbits: No acute finding Review of the MIP images confirms the above findings CTA NECK FINDINGS Aortic arch: Normal size and appearance.  Three vessel branching. Right carotid system: Mild atherosclerotic plaque at the carotid bifurcation without stenosis or dissection. No ulceration. Left carotid system: Mild atherosclerotic calcification at the bifurcation. No stenosis, dissection, or ulceration. Vertebral arteries: No proximal subclavian artery stenosis. Codominant vertebral arteries are smooth and widely patent to the dura. Skeleton: No acute or aggressive finding in the neck. Other neck: Known adenopathy at the thoracic inlet and left supraclavicular fossa. Multi nodular goiter. Upper chest: Known left upper lobe  malignancy with volume loss. No pulmonary emboli, partly seen on the left today. Review of the MIP images confirms the above findings CTA HEAD FINDINGS Anterior circulation: Symmetric carotid arteries. Symmetric branching. No major vessel occlusion, stenosis, or aneurysm. Posterior circulation:  Standard anatomy Venous sinuses: As permitted by contrast timing, patent. Anatomic variants: None significant Delayed phase: Posterior right frontal enhancement seen on brain MRI from earlier today may be subtly visible. Review of the MIP images confirms the above findings IMPRESSION: 1. Known multiple acute infarcts by MRI. No major vessel occlusion, flow limiting stenosis, or visible arterial embolic source. In this patient with recent pulmonary embolism,  recommend workup for PFO. 2. Mild atherosclerosis. 3. 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 Electronically Signed   By: Monte Fantasia M.D.   On: 06/17/2016 20:23   Mr Jeri Cos ZR Contrast  Result Date: 06/17/2016 CLINICAL DATA:  Lung cancer staging. EXAM: MRI HEAD WITHOUT AND WITH CONTRAST TECHNIQUE: Multiplanar, multiecho pulse sequences of the brain and surrounding structures were obtained without and with intravenous contrast. CONTRAST:  85m MULTIHANCE GADOBENATE DIMEGLUMINE 529 MG/ML IV SOLN COMPARISON:  None. FINDINGS: Brain: Numerous sub cm foci of restricted diffusion throughout the bilateral cerebellum and along the bilateral cerebral convexities, anterior and posterior circulation. No hemorrhagic changes within these areas. There are 2 areas of sub cm cortical enhancement in the right frontal parietal cortex which on sagittal acquisition have a flat morphology. Favor subacute enhancing infarcts over metastases. Will need short-term follow-up scan. No hydrocephalus. Vascular: Normal flow voids. Skull and upper cervical spine: 9 mm area of T2 hyperintense and T1 hypo intense, enhancing signal abnormality in the lateral rim right orbit. Sinuses/Orbits: Right mastoid effusion with negative nasopharynx. Other: These results were called by telephone at the time of interpretation on 06/17/2016 at 2:42 pm to Dr. GCharlaine Dalton, who verbally acknowledged these results. IMPRESSION: 1. Numerous small acute infarcts in multiple vascular distributions. In this patient with recent pulmonary embolism a PFO workup is recommended. 2. 2 subcentimeter foci of cortically based enhancement in the right cerebral hemisphere, favor subacute enhancing infarcts. Recommend follow-up scan in 2-3 months to exclude metastasis from patient's lung cancer. 3. 9 mm bone lesion in the right lateral orbital rim, metastasis versus developmental inclusion cyst (which occur in this  location). Attention on follow-up. 4. Right mastoid effusion with negative nasopharynx. Electronically Signed   By: JMonte FantasiaM.D.   On: 06/17/2016 14:44     Management plans discussed with the patient, family and they are in agreement.  CODE STATUS:     Code Status Orders        Start     Ordered   06/17/16 2102  Full code  Continuous     06/17/16 2101    Code Status History    Date Active Date Inactive Code Status Order ID Comments User Context   06/03/2016  9:17 PM 06/05/2016  8:04 AM Full Code [id number] VVaughan Basta MD Inpatient      TOTAL TIME TAKING CARE OF THIS PATIENT: 33 minutes.    Hower,  DKarenann CaiD on 06/18/2016 at 9:13 AM  Between 7am to 6pm - Pager - [phone]  After 6pm go to [email or web address] - pProofreader [facility]  Office  [phone] CC: Primary care physician; SKeith Rake MD

## 2016-06-18 NOTE — Progress Notes (Signed)
ANTICOAGULATION CONSULT NOTE - Initial Consult  Pharmacy Consult for Heparin  Indication: pulmonary embolus  No Known Allergies  Patient Measurements: Height: '5\' 4"'$  (162.6 cm) Weight: 145 lb (65.8 kg) IBW/kg (Calculated) : 59.2 Heparin Dosing Weight: 65.8 kg   Vital Signs: Temp: 98.1 F (36.7 C) (10/12 0114) Temp Source: Oral (10/12 0114) BP: 118/68 (10/12 0114) Pulse Rate: 85 (10/12 0114)  Labs:  Recent Labs  06/17/16 1731 06/17/16 2134 06/18/16 0054  HGB 12.2*  --   --   HCT 36.4*  --   --   PLT 364  --   --   APTT  --  51* 67*  LABPROT 23.4*  --   --   INR 2.05  --   --   CREATININE 1.29*  --   --     Estimated Creatinine Clearance: 49.7 mL/min (by C-G formula based on SCr of 1.29 mg/dL (H)).   Medical History: Past Medical History:  Diagnosis Date  . Abnormal prostate specific antigen 08/08/2012  . Adiposity 04/16/2015  . CVA (cerebral vascular accident) ([place]) 06/17/2016  . Diabetes mellitus without complication ([place])   . Diverticulosis of sigmoid colon 04/16/2015  . Dyslipidemia 03/18/2015  . Hemorrhoids, internal 04/16/2015  . Hypercholesteremia 04/16/2015  . Hyperlipidemia   . Hypertension   . Pulmonary embolism ([place])   . Wears dentures    partial upper    Medications:  Prescriptions Prior to Admission  Medication Sig Dispense Refill Last Dose  . finasteride (PROSCAR) 5 MG tablet Take 5 mg by mouth at bedtime.    06/16/2016 at 2000  . glipiZIDE (GLUCOTROL) 5 MG tablet Take 1 tablet (5 mg total) by mouth 2 (two) times daily before a meal. 180 tablet 0 06/17/2016 at 0800  . lisinopril (PRINIVIL,ZESTRIL) 10 MG tablet TAKE ONE TABLET BY MOUTH ONCE DAILY 90 tablet 0 06/17/2016 at 0800  . metFORMIN (GLUCOPHAGE) 1000 MG tablet Take 0.5 tablets (500 mg total) by mouth 2 (two) times daily. 90 tablet 0 06/17/2016 at 0800  . simvastatin (ZOCOR) 40 MG tablet Take 1 tablet (40 mg total) by mouth daily. 90 tablet 0 06/16/2016 at 1800  . tamsulosin (FLOMAX) 0.4 MG CAPS  capsule Take 0.4 mg by mouth every morning.    06/17/2016 at 0800  . XARELTO 15 MG TABS tablet Take 15 mg by mouth 2 (two) times daily.   06/17/2016 at 0800    Assessment: Pharmacy consulted to dose heparin in this 65 year old male admitted with PE.  Pt was on Xarelto 15 mg PO BID at home, last dose taken on 10/11 @ 0800.  Goal of Therapy:  Heparin level 0.3-0.7 units/ml  APTT :  66 - 109 Monitor platelets by anticoagulation protocol: Yes   Plan:  First aPTT therapeutic. Continue current rate. Will recheck in 6 hours.  Laural Benes, Pharm.D., BCPS Clinical Pharmacist 06/18/2016,1:50 AM

## 2016-06-19 ENCOUNTER — Encounter: Payer: Self-pay | Admitting: Internal Medicine

## 2016-06-19 ENCOUNTER — Inpatient Hospital Stay (HOSPITAL_BASED_OUTPATIENT_CLINIC_OR_DEPARTMENT_OTHER): Payer: Managed Care, Other (non HMO)

## 2016-06-19 ENCOUNTER — Inpatient Hospital Stay: Payer: Managed Care, Other (non HMO)

## 2016-06-19 ENCOUNTER — Inpatient Hospital Stay (HOSPITAL_BASED_OUTPATIENT_CLINIC_OR_DEPARTMENT_OTHER): Payer: Managed Care, Other (non HMO) | Admitting: Internal Medicine

## 2016-06-19 ENCOUNTER — Other Ambulatory Visit: Payer: Self-pay | Admitting: Internal Medicine

## 2016-06-19 VITALS — BP 131/87 | HR 96 | Temp 97.1°F | Ht 64.0 in | Wt 142.4 lb

## 2016-06-19 DIAGNOSIS — E7800 Pure hypercholesterolemia, unspecified: Secondary | ICD-10-CM

## 2016-06-19 DIAGNOSIS — E785 Hyperlipidemia, unspecified: Secondary | ICD-10-CM

## 2016-06-19 DIAGNOSIS — Z7901 Long term (current) use of anticoagulants: Secondary | ICD-10-CM

## 2016-06-19 DIAGNOSIS — E119 Type 2 diabetes mellitus without complications: Secondary | ICD-10-CM

## 2016-06-19 DIAGNOSIS — K429 Umbilical hernia without obstruction or gangrene: Secondary | ICD-10-CM

## 2016-06-19 DIAGNOSIS — K649 Unspecified hemorrhoids: Secondary | ICD-10-CM

## 2016-06-19 DIAGNOSIS — I10 Essential (primary) hypertension: Secondary | ICD-10-CM

## 2016-06-19 DIAGNOSIS — Z79899 Other long term (current) drug therapy: Secondary | ICD-10-CM

## 2016-06-19 DIAGNOSIS — I639 Cerebral infarction, unspecified: Secondary | ICD-10-CM

## 2016-06-19 DIAGNOSIS — H7091 Unspecified mastoiditis, right ear: Secondary | ICD-10-CM

## 2016-06-19 DIAGNOSIS — R590 Localized enlarged lymph nodes: Secondary | ICD-10-CM

## 2016-06-19 DIAGNOSIS — N281 Cyst of kidney, acquired: Secondary | ICD-10-CM

## 2016-06-19 DIAGNOSIS — R972 Elevated prostate specific antigen [PSA]: Secondary | ICD-10-CM

## 2016-06-19 DIAGNOSIS — C770 Secondary and unspecified malignant neoplasm of lymph nodes of head, face and neck: Secondary | ICD-10-CM | POA: Diagnosis not present

## 2016-06-19 DIAGNOSIS — C3412 Malignant neoplasm of upper lobe, left bronchus or lung: Secondary | ICD-10-CM | POA: Diagnosis not present

## 2016-06-19 DIAGNOSIS — K5730 Diverticulosis of large intestine without perforation or abscess without bleeding: Secondary | ICD-10-CM

## 2016-06-19 DIAGNOSIS — E278 Other specified disorders of adrenal gland: Secondary | ICD-10-CM

## 2016-06-19 DIAGNOSIS — R0789 Other chest pain: Secondary | ICD-10-CM

## 2016-06-19 DIAGNOSIS — Z86711 Personal history of pulmonary embolism: Secondary | ICD-10-CM

## 2016-06-19 DIAGNOSIS — Z86010 Personal history of colonic polyps: Secondary | ICD-10-CM

## 2016-06-19 DIAGNOSIS — Z86718 Personal history of other venous thrombosis and embolism: Secondary | ICD-10-CM

## 2016-06-19 DIAGNOSIS — E042 Nontoxic multinodular goiter: Secondary | ICD-10-CM

## 2016-06-19 DIAGNOSIS — I2699 Other pulmonary embolism without acute cor pulmonale: Secondary | ICD-10-CM | POA: Diagnosis not present

## 2016-06-19 DIAGNOSIS — Z7984 Long term (current) use of oral hypoglycemic drugs: Secondary | ICD-10-CM

## 2016-06-19 DIAGNOSIS — D538 Other specified nutritional anemias: Secondary | ICD-10-CM

## 2016-06-19 DIAGNOSIS — Z8042 Family history of malignant neoplasm of prostate: Secondary | ICD-10-CM

## 2016-06-19 DIAGNOSIS — N400 Enlarged prostate without lower urinary tract symptoms: Secondary | ICD-10-CM

## 2016-06-19 LAB — COMPREHENSIVE METABOLIC PANEL
ALBUMIN: 4 g/dL (ref 3.5–5.0)
ALK PHOS: 82 U/L (ref 38–126)
ALT: 37 U/L (ref 17–63)
ANION GAP: 11 (ref 5–15)
AST: 25 U/L (ref 15–41)
BILIRUBIN TOTAL: 0.5 mg/dL (ref 0.3–1.2)
BUN: 14 mg/dL (ref 6–20)
CALCIUM: 9.7 mg/dL (ref 8.9–10.3)
CO2: 24 mmol/L (ref 22–32)
CREATININE: 1.22 mg/dL (ref 0.61–1.24)
Chloride: 99 mmol/L — ABNORMAL LOW (ref 101–111)
GFR calc non Af Amer: >60 mL/min (ref >60)
GLUCOSE: 241 mg/dL — AB (ref 65–99)
Potassium: 4.3 mmol/L (ref 3.5–5.1)
SODIUM: 134 mmol/L — AB (ref 135–145)
TOTAL PROTEIN: 8 g/dL (ref 6.5–8.1)

## 2016-06-19 LAB — CBC WITH DIFFERENTIAL/PLATELET
Basophils Absolute: 0.1 10*3/uL (ref 0–0.1)
Basophils Relative: 1 %
Eosinophils Absolute: 0.2 10*3/uL (ref 0–0.7)
Eosinophils Relative: 3 %
HEMATOCRIT: 35.2 % — AB (ref 40.0–52.0)
HEMOGLOBIN: 12 g/dL — AB (ref 13.0–18.0)
LYMPHS ABS: 1 10*3/uL (ref 1.0–3.6)
Lymphocytes Relative: 13 %
MCH: 27.9 pg (ref 26.0–34.0)
MCHC: 34.2 g/dL (ref 32.0–36.0)
MCV: 81.7 fL (ref 80.0–100.0)
MONOS PCT: 8 %
Monocytes Absolute: 0.6 10*3/uL (ref 0.2–1.0)
NEUTROS ABS: 5.5 10*3/uL (ref 1.4–6.5)
NEUTROS PCT: 75 %
Platelets: 359 10*3/uL (ref 150–440)
RBC: 4.31 MIL/uL — AB (ref 4.40–5.90)
RDW: 14.5 % (ref 11.5–14.5)
WBC: 7.3 10*3/uL (ref 3.8–10.6)

## 2016-06-19 MED ORDER — CRIZOTINIB 250 MG PO CAPS: 250.0000 mg | ORAL_CAPSULE | Freq: Two times a day (BID) | ORAL | 4 refills | Status: DC

## 2016-06-19 MED ORDER — CYANOCOBALAMIN 1000 MCG/ML IJ SOLN
1000.0000 ug | Freq: Once | INTRAMUSCULAR | Status: AC
  Administered 2016-06-19: 1000 ug via INTRAMUSCULAR
  Filled 2016-06-19: qty 1

## 2016-06-19 NOTE — Progress Notes (Signed)
Recent visit to ER for MRI.  Pt reports having burry vision this morning and dizziness and had a nagging pain in behind his eye.  Pt's wife reports right eye looks lazy.

## 2016-06-19 NOTE — Progress Notes (Signed)
START ON PATHWAY REGIMEN - Non-Small Cell Lung  [id number]: Crizotinib 250 mg Twice Daily Until Progression or Unacceptable Toxicity   Administer twice daily:     Crizotinib (Xalkori(R)) 250 mg Give 250 mg orally twice a day with or without food. Dose Mod: None Additional Orders: Avoid concurrent use of strong CYP3A4 inhibitors/inducers and CYP3A4 substrates.  See prescribing info for more info. May increase QT interval.  Periodic ECG's/electrolyte monitoring recommended in susceptible patients. PI recommends  monthly monitoring of ALT/Bilirubin.  **Always confirm dose/schedule in your pharmacy ordering system**    Patient Characteristics: Stage IV Metastatic, Non Squamous, Molecular Targeted Therapy, Initial Molecular Targeted Therapy, ROS1 Rearrangement Positive AJCC M Stage: X AJCC N Stage: X AJCC T Stage: X Current Disease Status: Distant Metastases AJCC Stage Grouping: IV Histology: Non Squamous Cell ROS1 Rearrangement Status: Positive T790M Mutation Status: Not Applicable - EGFR Mutation Negative/Unknown Other Mutations/Biomarkers: No Other Actionable Mutations PD-L1 Expression Status: Quantity Not Sufficient Chemotherapy/Immunotherapy LOT: Not Appropriate Molecular Targeted Therapy: Initial Molecular Targeted Therapy ALK Translocation Status: Negative Would you be surprised if this patient died  in the next year? I would be surprised if this patient died in the next year EGFR Mutation Status: Negative/Wild Type BRAF V600E Mutation Status: Quantity Not Sufficient  Intent of Therapy: Non-Curative / Palliative Intent, Discussed with Patient

## 2016-06-19 NOTE — Assessment & Plan Note (Signed)
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   # Recommend Crizotinib 250 mg BID; discussed the potential side effects diarrhea; visual symptoms fatigue etc. We will try to obtain the drug for the patient ASAP.   # Given the  significant hypercoagulable state - if delay in obtaining the drug is -  I would recommend chemotherapy with carboplatin and Alimta next week. Patient will get B12 shot today.   Discussed the potential side effects including but not limited to-increasing fatigue, nausea vomiting, diarrhea, hair loss, sores in the mouth, increase risk of infection and also neuropathy.    # MRI brain- multiple strokes; no evidence of PFO on the 2-D echo bubble study. Evaluated by neurology.   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 Patient will likely need indefinite anticoagulation.   #  Follow up in 2 weeks.   # 40 minutes face-to-face with the patient discussing the above plan of care; more than 50% of time spent on prognosis/ natural history; counseling and coordination.

## 2016-06-19 NOTE — Progress Notes (Signed)
DISCONTINUE ON PATHWAY REGIMEN - Non-Small Cell Lung  [id number]: Crizotinib 250 mg Twice Daily Until Progression or Unacceptable Toxicity   Administer twice daily:     Crizotinib (Xalkori(R)) 250 mg Give 250 mg orally twice a day with or without food. Dose Mod: None Additional Orders: Avoid concurrent use of strong CYP3A4 inhibitors/inducers and CYP3A4 substrates.  See prescribing info for more info. May increase QT interval.  Periodic ECG's/electrolyte monitoring recommended in susceptible patients. PI recommends  monthly monitoring of ALT/Bilirubin.  **Always confirm dose/schedule in your pharmacy ordering system**    REASON: Other Reason PRIOR TREATMENT: [id number]: Crizotinib 250 mg Twice Daily Until Progression or Unacceptable Toxicity TREATMENT RESPONSE: Unable to Evaluate  START ON PATHWAY REGIMEN - Non-Small Cell Lung  [id number]: Carboplatin AUC=5 + Pemetrexed 500 mg/m2 q21 Days x 4 Cycles   A cycle is every 21 days:     Pemetrexed (Alimta(R)) 500 mg/m2 in 100 mL NS IV over 10 minutes followed 30 minutes later by carboplatin, manufacturer recommends not administering to patients with CrCl < 45 mL/min Dose Mod: None     Carboplatin (Paraplatin(R)) AUC=5 in 250 mL NS IV over 1 hour Dose Mod: None Additional Orders: Note: Patient to receive the following prior to the initiation of therapy: 1) Dexamethasone 4 mg orally twice daily x 6 doses.  First dose 24 hours before chemotherapy. 2) Folic acid >= 594 mcg orally daily.  First dose at least 5 days prior to the first dose of pemetrexed. 3) Vitamin B12 1,000 mcg intramuscularly every 9 weeks.  First dose at least 5 days prior to the first dose of pemetrexed.  All AUC calculations intended to be used in [employer]  **Always confirm dose/schedule in your pharmacy ordering system**    Patient Characteristics: Stage IV Metastatic, Non Squamous, Initial Chemotherapy/Immunotherapy, PS = 0, 1, PD-L1 Expression Positive 1-49% (TPS) /  Negative / Not Tested / Not a Candidate for Immunotherapy AJCC M Stage: X AJCC N Stage: X AJCC T Stage: X Current Disease Status: Distant Metastases AJCC Stage Grouping: IV Histology: Non Squamous Cell ROS1 Rearrangement Status: Positive T790M Mutation Status: Not Applicable - EGFR Mutation Negative/Unknown Other Mutations/Biomarkers: No Other Actionable Mutations PD-L1 Expression Status: Quantity Not Sufficient Chemotherapy/Immunotherapy LOT: Initial Chemotherapy/Immunotherapy Molecular Targeted Therapy: Not Appropriate ALK Translocation Status: Negative Would you be surprised if this patient died  in the next year? I would be surprised if this patient died in the next year EGFR Mutation Status: Negative/Wild Type BRAF V600E Mutation Status: Quantity Not Sufficient Performance Status: PS = 0, 1  Intent of Therapy: Non-Curative / Palliative Intent, Discussed with Patient

## 2016-06-19 NOTE — Patient Instructions (Addendum)
Crizotinib oral capsules What is this medicine? CRIZOTINIB Orest [place] ti nib) is a medicine that targets proteins in cancer cells and stops the cancer cells from growing. It is used to treat non-small cell lung cancer. This medicine may be used for other purposes; ask your health care provider or pharmacist if you have questions. What should I tell my health care provider before I take this medicine? They need to know if you have any of these conditions: -heart disease -history of irregular heartbeat -history of low levels of calcium, magnesium, or potassium in the blood -kidney disease -liver disease -an unusual or allergic reaction to crizotinib, other medicines, foods, dyes, or preservatives -pregnant or trying to get pregnant -breast-feeding How should I use this medicine? Take this medicine by mouth with a glass of water. Follow the directions on the prescription label. Do not cut, crush or chew this medicine. You can take it with or without food. If it upsets your stomach, take it with food. If you vomit after taking your medicine, take your next dose at the regular time and do not take an extra dose. Take the doses about 12 hours apart. Take your medicine at regular intervals. Do not take it more often than directed. Do not stop taking except on your doctor's advice. Talk to your pediatrician regarding the use of this medicine in children. Special care may be needed. Overdosage: If you think you have taken too much of this medicine contact a poison control center or emergency room at once. NOTE: This medicine is only for you. Do not share this medicine with others. What if I miss a dose? If you miss a dose, take it as soon as you can. If it is less than 6 hours before your next dose, do not make up for the missed dose and just take your next dose at your regular time. Do not take double or extra doses. What may interact with this medicine? Do not take this medicine with any of the  following medications: -arsenic trioxide -astemizole -certain antibiotics like clarithromycin, erythromycin, grepafloxacin, pentamidine, sparfloxacin, troleandomycin -certain medicines for fungal infections like fluconazole, itraconazole, ketoconazole, posaconazole, voriconazole -certain medicines for irregular heart beat like amiodarone, bepridil, dofetilide, dronedarone, encainide, flecainide, propafenone, quinidine -chloroquine -cisapride -dextromethorphan; quinidine -droperidol -halofantrine -haloperidol -levomethadyl -methadone -phenothiazines like chlorpromazine, mesoridazine, thioridazine -pimozide -probucol -propafenone -saquinavir -terfenadine -ziprasidoneThis medicine may also interact with the following medications: -antiviral medicines for HIV or AIDS -certain medicines for seizures like carbamazepine, phenobarbital, phenytoin -certain medicines for stomach problems like cimetidine, famotidine, omeprazole, lansoprazole -grapefruit juice -midazolam -nefazodone -other medicines that prolong the QT interval (cause an abnormal heart rhythm) -rifabutin -rifampin -St. John's wort, Hypericum perforatum -telithromycin This list may not describe all possible interactions. Give your health care provider a list of all the medicines, herbs, non-prescription drugs, or dietary supplements you use. Also tell them if you smoke, drink alcohol, or use illegal drugs. Some items may interact with your medicine. What should I watch for while using this medicine? Tell your doctor or healthcare professional if your symptoms do not start to get better or if they get worse. Tell your doctor about any unusual symptoms. Tell your doctor or health care professional right away if you have any change in your eyesight. Do not drive or use machinery if you have a change in your eyesight. Avoid taking products that contain aspirin, acetaminophen, ibuprofen, naproxen, or ketoprofen unless instructed by  your doctor. These medicines may hide a fever. Call your doctor  or health care professional for advice if you get a fever, chills or sore throat, or other symptoms of a cold or flu. Do not treat yourself. This drug decreases your body's ability to fight infections. Try to avoid being around people who are sick. This drug may make you feel generally unwell. This is not uncommon, as chemotherapy can affect healthy cells as well as cancer cells. Report any side effects. Continue your course of treatment even though you feel ill unless your doctor tells you to stop. Women should not become pregnant while taking this medicine and for at least 45 days after the last dose. Men should use condoms during treatment and for at least 90 days after the last dose. Women should inform their doctor if they wish to become pregnant or think they might be pregnant. There is potential for serious side effects to an unborn child. Talk to your health care professional or pharmacist for more information. Do not breast-feed an infant while taking this medicine and for 45 days after the last dose. What side effects may I notice from receiving this medicine? Side effects that you should report to your doctor or health care professional as soon as possible: -allergic reactions like skin rash, itching or hives, swelling of the face, lips, or tongue -low blood counts - this medicine may decrease the number of white blood cells, red blood cells and platelets. You may be at increased risk for infections and bleeding -signs of infection - fever or chills, cough, sore throat, pain or difficulty passing urine -signs of decreased platelets or bleeding - bruising, pinpoint red spots on the skin, black, tarry stools, blood in the urine, nosebleeds -breathing problems -changes in vision -chest pain or chest tightness -cough with or without mucous -dark urine -fast or irregular heartbeat -feeling faint or lightheaded, falls -general ill  feeling or flu-like symptoms -light-colored stools -loss of appetite -pain, tingling, numbness in the hands or feet -right upper belly pain -sores or white patches in your mouth or throat -unusually weak or tired -vomiting -yellowing of the eyes or skin Side effects that usually do not require medical attention (Report these to your doctor or health care professional if they continue or are bothersome.): -changes in taste -constipation -decreased appetite -diarrhea -dizziness -headache -joint pain -nausea -swelling of the ankles, feet, hands -tired -trouble sleeping -upset stomach This list may not describe all possible side effects. Call your doctor for medical advice about side effects. You may report side effects to FDA at 1-800-FDA-1088. Where should I keep my medicine? Keep out of the reach of children. Store at room temperature between 20 and 25 degrees C (68 and 77 degrees F). Throw away any unused medicine after the expiration date. NOTE: This sheet is a summary. It may not cover all possible information. If you have questions about this medicine, talk to your doctor, pharmacist, or health care provider.    2016, [employer]. (2014-10-31 21:57:18)    Carboplatin injection What is this medicine? CARBOPLATIN (KAR boe pla tin) is a chemotherapy drug. It targets fast dividing cells, like cancer cells, and causes these cells to die. This medicine is used to treat ovarian cancer and many other cancers. This medicine may be used for other purposes; ask your health care provider or pharmacist if you have questions. What should I tell my health care provider before I take this medicine? They need to know if you have any of these conditions: -blood disorders -hearing problems -kidney disease -recent  or ongoing radiation therapy -an unusual or allergic reaction to carboplatin, cisplatin, other chemotherapy, other medicines, foods, dyes, or preservatives -pregnant  or trying to get pregnant -breast-feeding How should I use this medicine? This drug is usually given as an infusion into a vein. It is administered in a hospital or clinic by a specially trained health care professional. Talk to your pediatrician regarding the use of this medicine in children. Special care may be needed. Overdosage: If you think you have taken too much of this medicine contact a poison control center or emergency room at once. NOTE: This medicine is only for you. Do not share this medicine with others. What if I miss a dose? It is important not to miss a dose. Call your doctor or health care professional if you are unable to keep an appointment. What may interact with this medicine? -medicines for seizures -medicines to increase blood counts like filgrastim, pegfilgrastim, sargramostim -some antibiotics like amikacin, gentamicin, neomycin, streptomycin, tobramycin -vaccines Talk to your doctor or health care professional before taking any of these medicines: -acetaminophen -aspirin -ibuprofen -ketoprofen -naproxen This list may not describe all possible interactions. Give your health care provider a list of all the medicines, herbs, non-prescription drugs, or dietary supplements you use. Also tell them if you smoke, drink alcohol, or use illegal drugs. Some items may interact with your medicine. What should I watch for while using this medicine? Your condition will be monitored carefully while you are receiving this medicine. You will need important blood work done while you are taking this medicine. This drug may make you feel generally unwell. This is not uncommon, as chemotherapy can affect healthy cells as well as cancer cells. Report any side effects. Continue your course of treatment even though you feel ill unless your doctor tells you to stop. In some cases, you may be given additional medicines to help with side effects. Follow all directions for their use. Call  your doctor or health care professional for advice if you get a fever, chills or sore throat, or other symptoms of a cold or flu. Do not treat yourself. This drug decreases your body's ability to fight infections. Try to avoid being around people who are sick. This medicine may increase your risk to bruise or bleed. Call your doctor or health care professional if you notice any unusual bleeding. Be careful brushing and flossing your teeth or using a toothpick because you may get an infection or bleed more easily. If you have any dental work done, tell your dentist you are receiving this medicine. Avoid taking products that contain aspirin, acetaminophen, ibuprofen, naproxen, or ketoprofen unless instructed by your doctor. These medicines may hide a fever. Do not become pregnant while taking this medicine. Women should inform their doctor if they wish to become pregnant or think they might be pregnant. There is a potential for serious side effects to an unborn child. Talk to your health care professional or pharmacist for more information. Do not breast-feed an infant while taking this medicine. What side effects may I notice from receiving this medicine? Side effects that you should report to your doctor or health care professional as soon as possible: -allergic reactions like skin rash, itching or hives, swelling of the face, lips, or tongue -signs of infection - fever or chills, cough, sore throat, pain or difficulty passing urine -signs of decreased platelets or bleeding - bruising, pinpoint red spots on the skin, black, tarry stools, nosebleeds -signs of decreased red blood cells -  unusually weak or tired, fainting spells, lightheadedness -breathing problems -changes in hearing -changes in vision -chest pain -high blood pressure -low blood counts - This drug may decrease the number of white blood cells, red blood cells and platelets. You may be at increased risk for infections and  bleeding. -nausea and vomiting -pain, swelling, redness or irritation at the injection site -pain, tingling, numbness in the hands or feet -problems with balance, talking, walking -trouble passing urine or change in the amount of urine Side effects that usually do not require medical attention (report to your doctor or health care professional if they continue or are bothersome): -hair loss -loss of appetite -metallic taste in the mouth or changes in taste This list may not describe all possible side effects. Call your doctor for medical advice about side effects. You may report side effects to FDA at 1-800-FDA-1088. Where should I keep my medicine? This drug is given in a hospital or clinic and will not be stored at home. NOTE: This sheet is a summary. It may not cover all possible information. If you have questions about this medicine, talk to your doctor, pharmacist, or health care provider.    2016, [employer]. (2007-11-29 14:38:05)     Pemetrexed injection What is this medicine? PEMETREXED (PEM e TREX ed) is a chemotherapy drug. This medicine affects cells that are rapidly growing, such as cancer cells and cells in your mouth and stomach. It is usually used to treat lung cancers like non-small cell lung cancer and mesothelioma. It may also be used to treat other cancers. This medicine may be used for other purposes; ask your health care provider or pharmacist if you have questions. What should I tell my health care provider before I take this medicine? They need to know if you have any of these conditions: -if you frequently drink alcohol containing beverages -infection (especially a virus infection such as chickenpox, cold sores, or herpes) -kidney disease -liver disease -low blood counts, like low platelets, red bloods, or white blood cells -an unusual or allergic reaction to pemetrexed, mannitol, other medicines, foods, dyes, or preservatives -pregnant or trying  to get pregnant -breast-feeding How should I use this medicine? This drug is given as an infusion into a vein. It is administered in a hospital or clinic by a specially trained health care professional. Talk to your pediatrician regarding the use of this medicine in children. Special care may be needed. Overdosage: If you think you have taken too much of this medicine contact a poison control center or emergency room at once. NOTE: This medicine is only for you. Do not share this medicine with others. What if I miss a dose? It is important not to miss your dose. Call your doctor or health care professional if you are unable to keep an appointment. What may interact with this medicine? -aspirin and aspirin-like medicines -medicines to increase blood counts like filgrastim, pegfilgrastim, sargramostim -methotrexate -NSAIDS, medicines for pain and inflammation, like ibuprofen or naproxen -probenecid -pyrimethamine -vaccines Talk to your doctor or health care professional before taking any of these medicines: -acetaminophen -aspirin -ibuprofen -ketoprofen -naproxen This list may not describe all possible interactions. Give your health care provider a list of all the medicines, herbs, non-prescription drugs, or dietary supplements you use. Also tell them if you smoke, drink alcohol, or use illegal drugs. Some items may interact with your medicine. What should I watch for while using this medicine? Visit your doctor for checks on your progress. This drug  may make you feel generally unwell. This is not uncommon, as chemotherapy can affect healthy cells as well as cancer cells. Report any side effects. Continue your course of treatment even though you feel ill unless your doctor tells you to stop. In some cases, you may be given additional medicines to help with side effects. Follow all directions for their use. Call your doctor or health care professional for advice if you get a fever, chills or  sore throat, or other symptoms of a cold or flu. Do not treat yourself. This drug decreases your body's ability to fight infections. Try to avoid being around people who are sick. This medicine may increase your risk to bruise or bleed. Call your doctor or health care professional if you notice any unusual bleeding. Be careful brushing and flossing your teeth or using a toothpick because you may get an infection or bleed more easily. If you have any dental work done, tell your dentist you are receiving this medicine. Avoid taking products that contain aspirin, acetaminophen, ibuprofen, naproxen, or ketoprofen unless instructed by your doctor. These medicines may hide a fever. Call your doctor or health care professional if you get diarrhea or mouth sores. Do not treat yourself. To protect your kidneys, drink water or other fluids as directed while you are taking this medicine. Men and women must use effective birth control while taking this medicine. You may also need to continue using effective birth control for a time after stopping this medicine. Do not become pregnant while taking this medicine. Tell your doctor right away if you think that you or your partner might be pregnant. There is a potential for serious side effects to an unborn child. Talk to your health care professional or pharmacist for more information. Do not breast-feed an infant while taking this medicine. This medicine may lower sperm counts. What side effects may I notice from receiving this medicine? Side effects that you should report to your doctor or health care professional as soon as possible: -allergic reactions like skin rash, itching or hives, swelling of the face, lips, or tongue -low blood counts - this medicine may decrease the number of white blood cells, red blood cells and platelets. You may be at increased risk for infections and bleeding. -signs of infection - fever or chills, cough, sore throat, pain or difficulty  passing urine -signs of decreased platelets or bleeding - bruising, pinpoint red spots on the skin, black, tarry stools, blood in the urine -signs of decreased red blood cells - unusually weak or tired, fainting spells, lightheadedness -breathing problems, like a dry cough -changes in emotions or moods -chest pain -confusion -diarrhea -high blood pressure -mouth or throat sores or ulcers -pain, swelling, warmth in the leg -pain on swallowing -swelling of the ankles, feet, hands -trouble passing urine or change in the amount of urine -vomiting -yellowing of the eyes or skin Side effects that usually do not require medical attention (report to your doctor or health care professional if they continue or are bothersome): -hair loss -loss of appetite -nausea -stomach upset This list may not describe all possible side effects. Call your doctor for medical advice about side effects. You may report side effects to FDA at 1-800-FDA-1088. Where should I keep my medicine? This drug is given in a hospital or clinic and will not be stored at home. NOTE: This sheet is a summary. It may not cover all possible information. If you have questions about this medicine, talk to your doctor,  pharmacist, or health care provider.    2016, [employer]. (2008-03-27 13:24:03)

## 2016-06-20 NOTE — Progress Notes (Signed)
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      Primary cancer of left upper lobe of lung ([place])   06/12/2016 Initial Diagnosis    Primary cancer of left upper lobe of lung ([place])       HISTORY OF PRESENTING ILLNESS:  Melvin Willis 65 y.o.  male with newly diagnosed lung cancer; DVT/PE on xarelto is here to review the results of the pathology /plan of care.   In the interim patient had an MRI of the brain that showed multiple clots to the brain- patient was admitted to the hospital for further evaluation. He is currently discharge.  He denies any significant neurologic deficits. Complains of intermittent dizzy spells. Otherwise no falls. No bleeding.  Shortness of breath is improved. Has been improved. No bleeding noted. No headaches or nausea no vomiting.  ROS: A complete 10 point review of system is done which is negative except mentioned above in history of present illness  MEDICAL HISTORY:  Past Medical History:  Diagnosis Date  . Abnormal prostate specific antigen 08/08/2012  . Adiposity 04/16/2015  . CVA (cerebral vascular accident) ([place]) 06/17/2016  . Diabetes mellitus without complication ([place])   . Diverticulosis of sigmoid colon 04/16/2015  . Dyslipidemia 03/18/2015  . Hemorrhoids, internal 04/16/2015  . Hypercholesteremia 04/16/2015  . Hyperlipidemia   . Hypertension   . Primary cancer of left upper lobe of lung ([place])   . Pulmonary embolism ([place])   . Wears dentures    partial upper    SURGICAL  HISTORY: Past Surgical History:  Procedure Laterality Date  . COLONOSCOPY    . COLONOSCOPY WITH PROPOFOL N/A 05/06/2015   Procedure: COLONOSCOPY WITH PROPOFOL;  Surgeon: Lucilla Lame, MD;  Location: [facility];  Service: Endoscopy;  Laterality: N/A;  ASCENDING COLON POLYPS X 2 TERMINAL ILEUM BIOPSY RANDOM COLON BX. TRANSVERSE COLON POLYP SIGMOID COLON POLYP  . ESOPHAGOGASTRODUODENOSCOPY (EGD) WITH PROPOFOL N/A 05/06/2015   Procedure: ESOPHAGOGASTRODUODENOSCOPY (EGD) WITH PROPOFOL;  Surgeon: Lucilla Lame, MD;  Location: [facility];  Service: Endoscopy;  Laterality: N/A;  GASTRIC BIOPSY X1    SOCIAL HISTORY: Social History   Social History  . Marital status: Married    Spouse name: N/A  . Number of children: N/A  . Years of education: N/A   Occupational History  . Not on file.   Social History Main Topics  . Smoking status: Never Smoker  . Smokeless tobacco: Never Used  . Alcohol use No  . Drug use: No  . Sexual activity: Yes    Partners: Female   Other Topics Concern  . Not on file   Social History Narrative  . No narrative on file    FAMILY HISTORY: Family History  Problem Relation Age of Onset  . Diabetes Mother   . Diabetes Father   . CAD Father   . Dementia Father   . Diabetes Sister   . Cancer Maternal Uncle     Prostate  . Cancer Cousin     prostate  ALLERGIES:  has No Known Allergies.  MEDICATIONS:  Current Outpatient Prescriptions  Medication Sig Dispense Refill  . crizotinib (XALKORI) 250 MG capsule Take 1 capsule (250 mg total) by mouth 2 (two) times daily. 60 capsule 4  . finasteride (PROSCAR) 5 MG tablet Take 5 mg by mouth at bedtime.     Marland Kitchen glipiZIDE (GLUCOTROL) 5 MG tablet Take 1 tablet (5 mg total) by mouth 2 (two) times daily before a meal. 180 tablet 0  . lisinopril (PRINIVIL,ZESTRIL) 10 MG tablet TAKE ONE TABLET BY MOUTH ONCE DAILY 90 tablet 0  . metFORMIN (GLUCOPHAGE) 1000 MG tablet Take 0.5 tablets (500 mg total) by  mouth 2 (two) times daily. 90 tablet 0  . tamsulosin (FLOMAX) 0.4 MG CAPS capsule Take 0.4 mg by mouth every morning.     Alveda Reasons 15 MG TABS tablet Take 15 mg by mouth 2 (two) times daily.    . simvastatin (ZOCOR) 40 MG tablet      No current facility-administered medications for this visit.       Marland Kitchen  PHYSICAL EXAMINATION: ECOG PERFORMANCE STATUS: 0 - Asymptomatic  Vitals:   06/19/16 1214 06/19/16 1219  BP: 133/82 131/87  Pulse: 94 96  Temp: 97.1 F (36.2 C)    Filed Weights   06/19/16 1214  Weight: 142 lb 6.4 oz (64.6 kg)    GENERAL: Well-nourished well-developed; Alert, no distress and comfortable.   With his wife.  EYES: no pallor or icterus OROPHARYNX: no thrush or ulceration; good dentition  NECK: supple, no masses felt LYMPH:  no palpable lymphadenopathy in the cervical, axillary or inguinal regions LUNGS: clear to auscultation and  No wheeze or crackles HEART/CVS: regular rate & rhythm and no murmurs; No lower extremity edema ABDOMEN: abdomen soft, non-tender and normal bowel sounds Musculoskeletal:no cyanosis of digits and no clubbing  PSYCH: alert & oriented x 3 with fluent speech NEURO: no focal motor/sensory deficits SKIN:  no rashes or significant lesions  LABORATORY DATA:  I have reviewed the data as listed Lab Results  Component Value Date   WBC 7.3 06/19/2016   HGB 12.0 (L) 06/19/2016   HCT 35.2 (L) 06/19/2016   MCV 81.7 06/19/2016   PLT 359 06/19/2016    Recent Labs  06/03/16 1133 06/03/16 1630 06/04/16 0103 06/17/16 1731 06/19/16 1110  NA 137 135 134* 136 134*  K 4.5 4.2 4.3 4.5 4.3  CL 106 102 105 103 99*  CO2 '23 25 24 24 24  ' GLUCOSE 105* 126* 220* 210* 241*  BUN '15 13 14 17 14  ' CREATININE 1.18 1.23 1.20 1.29* 1.22  CALCIUM 9.7 9.6 9.4 9.9 9.7  GFRNONAA >60 >60 >60 58* >60  GFRAA >60 >60 >60 >60 >60  PROT 7.8 8.1  --   --  8.0  ALBUMIN 3.9 4.2  --   --  4.0  AST 17 19  --   --  25  ALT 18 19  --   --  37  ALKPHOS 88 92  --   --   82  BILITOT 0.8 0.9  --   --  0.5    RADIOGRAPHIC STUDIES: I have personally reviewed the radiological images as listed and agreed with the findings in the report. Ct Angio Head W Or Wo Contrast  Result Date: 06/17/2016 CLINICAL DATA:  Stroke on MRI earlier today. EXAM: CT ANGIOGRAPHY HEAD AND NECK TECHNIQUE: Multidetector CT imaging of the head and neck was performed using the standard protocol during bolus administration of  intravenous contrast. Multiplanar CT image reconstructions and MIPs were obtained to evaluate the vascular anatomy. Carotid stenosis measurements (when applicable) are obtained utilizing NASCET criteria, using the distal internal carotid diameter as the denominator. CONTRAST:  Study 5 cc Isovue 370 intravenous COMPARISON:  Brain MRI from earlier today FINDINGS: CT HEAD FINDINGS Brain: Known acute infarcts are not clearly visible. No hemorrhage, hydrocephalus, or shift. Vascular: No hyperdense vessel or unexpected calcification. Skull: 7 mm lucency in the lateral right orbital rim, which was described on brain MRI from earlier today. CT features favor a benign process. Sinuses: Right mastoiditis. Orbits: No acute finding Review of the MIP images confirms the above findings CTA NECK FINDINGS Aortic arch: Normal size and appearance.  Three vessel branching. Right carotid system: Mild atherosclerotic plaque at the carotid bifurcation without stenosis or dissection. No ulceration. Left carotid system: Mild atherosclerotic calcification at the bifurcation. No stenosis, dissection, or ulceration. Vertebral arteries: No proximal subclavian artery stenosis. Codominant vertebral arteries are smooth and widely patent to the dura. Skeleton: No acute or aggressive finding in the neck. Other neck: Known adenopathy at the thoracic inlet and left supraclavicular fossa. Multi nodular goiter. Upper chest: Known left upper lobe malignancy with volume loss. No pulmonary emboli, partly seen on the left  today. Review of the MIP images confirms the above findings CTA HEAD FINDINGS Anterior circulation: Symmetric carotid arteries. Symmetric branching. No major vessel occlusion, stenosis, or aneurysm. Posterior circulation:  Standard anatomy Venous sinuses: As permitted by contrast timing, patent. Anatomic variants: None significant Delayed phase: Posterior right frontal enhancement seen on brain MRI from earlier today may be subtly visible. Review of the MIP images confirms the above findings IMPRESSION: 1. Known multiple acute infarcts by MRI. No major vessel occlusion, flow limiting stenosis, or visible arterial embolic source. In this patient with recent pulmonary embolism, recommend workup for PFO. 2. Mild atherosclerosis. 3. 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 Electronically Signed   By: Monte Fantasia M.D.   On: 06/17/2016 20:23   Ct Angio Neck W Or Wo Contrast  Result Date: 06/17/2016 CLINICAL DATA:  Stroke on MRI earlier today. EXAM: CT ANGIOGRAPHY HEAD AND NECK TECHNIQUE: Multidetector CT imaging of the head and neck was performed using the standard protocol during bolus administration of intravenous contrast. Multiplanar CT image reconstructions and MIPs were obtained to evaluate the vascular anatomy. Carotid stenosis measurements (when applicable) are obtained utilizing NASCET criteria, using the distal internal carotid diameter as the denominator. CONTRAST:  Study 5 cc Isovue 370 intravenous COMPARISON:  Brain MRI from earlier today FINDINGS: CT HEAD FINDINGS Brain: Known acute infarcts are not clearly visible. No hemorrhage, hydrocephalus, or shift. Vascular: No hyperdense vessel or unexpected calcification. Skull: 7 mm lucency in the lateral right orbital rim, which was described on brain MRI from earlier today. CT features favor a benign process. Sinuses: Right mastoiditis. Orbits: No acute finding Review of the MIP images confirms the above findings  CTA NECK FINDINGS Aortic arch: Normal size and appearance.  Three vessel branching. Right carotid system: Mild atherosclerotic plaque at the carotid bifurcation without stenosis or dissection. No ulceration. Left carotid system: Mild atherosclerotic calcification at the bifurcation. No stenosis, dissection, or ulceration. Vertebral arteries: No proximal subclavian artery stenosis. Codominant vertebral arteries are smooth and widely patent to the dura. Skeleton: No acute or aggressive finding in the neck. Other neck: Known adenopathy at the thoracic inlet and left supraclavicular fossa. Multi nodular goiter. Upper chest: Known left upper lobe malignancy  with volume loss. No pulmonary emboli, partly seen on the left today. Review of the MIP images confirms the above findings CTA HEAD FINDINGS Anterior circulation: Symmetric carotid arteries. Symmetric branching. No major vessel occlusion, stenosis, or aneurysm. Posterior circulation:  Standard anatomy Venous sinuses: As permitted by contrast timing, patent. Anatomic variants: None significant Delayed phase: Posterior right frontal enhancement seen on brain MRI from earlier today may be subtly visible. Review of the MIP images confirms the above findings IMPRESSION: 1. Known multiple acute infarcts by MRI. No major vessel occlusion, flow limiting stenosis, or visible arterial embolic source. In this patient with recent pulmonary embolism, recommend workup for PFO. 2. Mild atherosclerosis. 3. 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 Electronically Signed   By: Monte Fantasia M.D.   On: 06/17/2016 20:23   Ct Angio Chest W/cm &/or Wo Cm  Result Date: 06/03/2016 CLINICAL DATA:  Severe left chest pain radiating down arm since this morning. Shortness of breath and fatigue since lithotripsy approximately 1 month ago. EXAM: CT ANGIOGRAPHY CHEST WITH CONTRAST TECHNIQUE: Multidetector CT imaging of the chest was performed using the  standard protocol during bolus administration of intravenous contrast. Multiplanar CT image reconstructions and MIPs were obtained to evaluate the vascular anatomy. CONTRAST:  100 cc Isovue 370 COMPARISON:  None. FINDINGS: Cardiovascular: Multiple pulmonary emboli within the central segmental pulmonary arteries to the bilateral lower lobes, right greater than left. Several additional small pulmonary emboli within the central segmental pulmonary artery branches to the right upper lobe. No evidence of associated right heart failure. Heart size is normal. No pericardial effusion. Thoracic aorta is normal in caliber. No aortic dissection. Mediastinum/Nodes: Numerous small and enlarged lymph nodes within the mediastinum, largest lymph nodes within the anterior mediastinum. Representative lymph node within the anterior mediastinum measures 1.9 cm short axis dimension (series 5, image 33). Additional conglomerate lymphadenopathy noted within the precarinal space measuring approximately 3.6 x 2.1 cm. Conglomerate subcarinal lymphadenopathy measures approximately 3.2 x 2.4 cm. Additional suspicious lymph nodes are seen in the right upper paratracheal region (series 5, images 14 through 23). Several mildly prominent supraclavicular lymph nodes are seen bilaterally, largest on the right measuring 1.1 cm short axis dimension (series 5, image 10). Esophagus and trachea are unremarkable. Lungs/Pleura: Dense consolidation is seen within the anterior segment of the left upper lobe, favored to represent airspace collapse secondary to a central obstructing mass. The presumed central obstructing mass is not clearly demarcated. The bronchus to the anterior segment of the left upper lobe terminates at the proximal margin of the consolidation. Lungs otherwise clear.  No pleural effusion. Upper Abdomen: Hypodense masses are seen within the bilateral liver lobes, largest of which is in the left liver lobe demonstrating CT density  measurements compatible with benign cysts (2 abutting cysts with largest component measuring 3 cm greatest dimension. A smaller lesion in the right liver lobe measures 1.6 x 1.6 cm, too small to definitively characterize. Limited images of the upper abdomen are otherwise unremarkable. Musculoskeletal: No acute or suspicious osseous finding. Superficial soft tissues are unremarkable. Review of the MIP images confirms the above findings. IMPRESSION: 1. Bilateral small pulmonary emboli within the central segmental pulmonary arteries to the right lower lobe, right upper lobe and left lower lobe. No evidence of associated right heart failure. 2. Large dense consolidation within the anterior segment of the left upper lobe, almost certainly postobstructive airspace collapse related to a central obstructing mass. The presumed central obstructing mass, however, is not clearly  demarcated but is likely at the level of the acutely occluded left upper lobe bronchus. There is additional obstruction of the adjacent left upper lobe pulmonary vein. 3. Fairly extensive mediastinal lymphadenopathy, with measurements given above. Additional mildly prominent lymph nodes within the supraclavicular regions are also suspicious for metastatic lymphadenopathy. 4. Hypodense masses within each liver lobe, largest located within the left liver lobe has the appearance of 2 abutting simple cysts based on CT density measurements. The lesion within the right liver lobe is too small to definitively characterize and requires further characterization with MRI to exclude metastasis. Critical Value/emergent results were called by telephone at the time of interpretation on 06/03/2016 at 3:30 pm to Dr. Dossie Der [facility] , who verbally acknowledged these results. Electronically Signed   By: Franki Cabot M.D.   On: 06/03/2016 15:35   Mr Jeri Cos RF Contrast  Result Date: 06/17/2016 CLINICAL DATA:  Lung cancer staging. EXAM: MRI HEAD WITHOUT AND WITH CONTRAST  TECHNIQUE: Multiplanar, multiecho pulse sequences of the brain and surrounding structures were obtained without and with intravenous contrast. CONTRAST:  10m MULTIHANCE GADOBENATE DIMEGLUMINE 529 MG/ML IV SOLN COMPARISON:  None. FINDINGS: Brain: Numerous sub cm foci of restricted diffusion throughout the bilateral cerebellum and along the bilateral cerebral convexities, anterior and posterior circulation. No hemorrhagic changes within these areas. There are 2 areas of sub cm cortical enhancement in the right frontal parietal cortex which on sagittal acquisition have a flat morphology. Favor subacute enhancing infarcts over metastases. Will need short-term follow-up scan. No hydrocephalus. Vascular: Normal flow voids. Skull and upper cervical spine: 9 mm area of T2 hyperintense and T1 hypo intense, enhancing signal abnormality in the lateral rim right orbit. Sinuses/Orbits: Right mastoid effusion with negative nasopharynx. Other: These results were called by telephone at the time of interpretation on 06/17/2016 at 2:42 pm to Dr. GCharlaine Dalton, who verbally acknowledged these results. IMPRESSION: 1. Numerous small acute infarcts in multiple vascular distributions. In this patient with recent pulmonary embolism a PFO workup is recommended. 2. 2 subcentimeter foci of cortically based enhancement in the right cerebral hemisphere, favor subacute enhancing infarcts. Recommend follow-up scan in 2-3 months to exclude metastasis from patient's lung cancer. 3. 9 mm bone lesion in the right lateral orbital rim, metastasis versus developmental inclusion cyst (which occur in this location). Attention on follow-up. 4. Right mastoid effusion with negative nasopharynx. Electronically Signed   By: JMonte FantasiaM.D.   On: 06/17/2016 14:44   Ct Abdomen Pelvis W Contrast  Result Date: 06/04/2016 CLINICAL DATA:  A recent chest CT demonstrated pulmonary emboli eye and concern for malignancy. Possible liver lesion on chest  CT. EXAM: CT ABDOMEN AND PELVIS WITH CONTRAST TECHNIQUE: Multidetector CT imaging of the abdomen and pelvis was performed using the standard protocol following bolus administration of intravenous contrast. CONTRAST:  1038mISOVUE-300 IOPAMIDOL (ISOVUE-300) INJECTION 61% COMPARISON:  Chest CTA 06/03/2016 FINDINGS: Lower chest: Lung bases are clear without pleural fluid. A few of the known pulmonary emboli are visualized at the lung bases. Hepatobiliary: There are low-density structures scattered throughout the liver. The largest structures are water attenuation and compatible with cysts. The largest is a bilobed or slightly complex cyst near the left hepatic dome measuring up to 4.4 cm. Some of the smaller structures are too small to definitively characterize. Portal venous system is patent. Normal appearance of the gallbladder. No biliary dilatation. Pancreas: Focal fat near the junction of pancreatic head and uncinate process. Otherwise, normal appearance of the pancreas. Spleen: Peripheral  low-density structure along the superior spleen measures roughly 2.2 cm. This could be related to a splenic infarct. Remainder of the spleen has a normal appearance. Adrenals/Urinary Tract: Indeterminate 1.2 cm nodule in the left adrenal gland. Questionable nodule along the right adrenal gland medial limb. 1.5 cm cortical cyst in left kidney upper pole. No hydronephrosis. No suspicious renal lesions. Bladder is distended and the top the bladder extends up to the umbilicus. Stomach/Bowel: The stomach is unremarkable. Colonic diverticulosis without acute colonic inflammation. Normal appendix. Mild wall thickening of the distal stomach is probably within normal limits. Vascular/Lymphatic: Aortic atherosclerosis without aneurysm. Incidentally, there is a circumaortic left renal vein. Enlarged lymph nodes in the lower chest around the descending thoracic aorta and esophagus. Index lymph node on sequence 2, image 12 measures 1.4 cm.  Enlarged lymph node in the left external iliac chain on sequence 2, image 62 measures 2.4 x 1.9 cm. Reproductive: Prostate is massively enlarged measuring 7.5 x 7.6 x 7.6 cm. In addition, the prostate is very nodular with large nodular components extending into the bladder base and a large nodular component along the right posterior aspect of the prostate. Other: No free fluid.  Small umbilical hernia containing fat. Musculoskeletal: Heterogeneity in the right iliac bone near the right SI joint is nonspecific. Disc space narrowing with endplate disease at Q2-V9. IMPRESSION: Enlarged lymph nodes in the lower chest are compatible with metastatic disease seen on the prior chest CT. In addition, there is an indeterminate 1.2 cm left adrenal nodule which could represent metastatic disease. Question a small right adrenal lesion. Enlarged lymph node in the the left pelvis could also represent a metastatic lesion. Recommend further characterization of the lymphadenopathy and presumed metastatic disease with a PET-CT. Numerous hypodense structures throughout the liver. The largest of these lesions are compatible with cysts. Massive enlargement of the prostate with diffuse nodularity. This could represent benign prostatic hypertrophy and recommend correlation with PSA level. Urinary bladder is distended and suspect there is a component of bladder outlet obstruction associated with this prostate hypertrophy. Wedge-shaped defect in the spleen. This could represent a small splenic infarct but indeterminate. Known pulmonary emboli. Electronically Signed   By: Markus Daft M.D.   On: 06/04/2016 16:02   US Biopsy  Result Date: 06/05/2016 CLINICAL DATA:  Anterior left lung mass. Mediastinal and bilateral supraclavicular adenopathy. Liver lesions. EXAM: ULTRASOUND GUIDED CORE BIOPSY OF LEFT SUPRACLAVICULAR ADENOPATHY MEDICATIONS: Lidocaine 1% subcutaneous PROCEDURE: The procedure, risks, benefits, and alternatives were explained to  the patient. Questions regarding the procedure were encouraged and answered. The patient understands and consents to the procedure. Survey ultrasound of the left supraclavicular region was performed. The pathologic adenopathy was localized and an appropriate skin entry site determined and marked. The operative field was prepped with chlorhexidine in a sterile fashion, and a sterile drape was applied covering the operative field. A sterile gown and sterile gloves were used for the procedure. Local anesthesia was provided with 1% Lidocaine. Under real-time ultrasound guidance, a 17 gauge trocar needle was advanced to the margin of the lesion. Once needle tip position was confirmed, coaxial 18-gauge core biopsy samples were obtained, submitted in saline to surgical pathology. The guide needle was removed. The patient tolerated the procedure well. COMPLICATIONS: None. FINDINGS: Left supraclavicular adenopathy was confirmed. Core biopsy samples obtained under ultrasound guidance as above. IMPRESSION: 1. Technically successful ultrasound-guided core biopsy of left supraclavicular pathologic adenopathy. Electronically Signed   By: Lucrezia Europe M.D.   On: 06/05/2016 16:08  US Venous Img Lower Unilateral Left  Result Date: 06/03/2016 CLINICAL DATA:  Subacute onset of left lower extremity pain. Known pulmonary embolus. Initial encounter. EXAM: LEFT LOWER EXTREMITY VENOUS DOPPLER ULTRASOUND TECHNIQUE: Gray-scale sonography with graded compression, as well as color Doppler and duplex ultrasound were performed to evaluate the lower extremity deep venous systems from the level of the common femoral vein and including the common femoral, femoral, profunda femoral, popliteal and calf veins including the posterior tibial, peroneal and gastrocnemius veins when visible. The superficial great saphenous vein was also interrogated. Spectral Doppler was utilized to evaluate flow at rest and with distal augmentation maneuvers in the  common femoral, femoral and popliteal veins. COMPARISON:  None. FINDINGS: Contralateral Common Femoral Vein: Respiratory phasicity is normal and symmetric with the symptomatic side. No evidence of thrombus. Normal compressibility. Common Femoral Vein: No evidence of thrombus. Normal compressibility, respiratory phasicity and response to augmentation. Saphenofemoral Junction: No evidence of thrombus. Normal compressibility and flow on color Doppler imaging. Profunda Femoral Vein: No evidence of thrombus. Normal compressibility and flow on color Doppler imaging. Femoral Vein: No evidence of thrombus. Normal compressibility, respiratory phasicity and response to augmentation. Popliteal Vein: No evidence of thrombus. Normal compressibility, respiratory phasicity and response to augmentation. Calf Veins: Occlusive thrombus is noted within one of the patient's left posterior tibial veins. The remaining visualized calf veins are unremarkable. Superficial Great Saphenous Vein: No evidence of thrombus. Normal compressibility and flow on color Doppler imaging. Venous Reflux:  None. Other Findings:  None. IMPRESSION: Occlusive deep venous thrombus noted within one of the patient's posterior tibial veins. These results were called by telephone at the time of interpretation on 06/03/2016 at 6:16 pm to Dr. Harvest Dark, who verbally acknowledged these results. Electronically Signed   By: Garald Balding M.D.   On: 06/03/2016 18:19    ASSESSMENT & PLAN:   Primary cancer of left upper lobe of lung ([place]) # Adenocarcinoma of the lung metastatic/stage IV- bilateral supraclavicular adenopathy; ROS-1 POSITIVE.   # Recommend Crizotinib 250 mg BID; discussed the potential side effects diarrhea; visual symptoms fatigue etc. We will try to obtain the drug for the patient ASAP.   # Given the  significant hypercoagulable state - if delay in obtaining the drug is -  I would recommend chemotherapy with carboplatin and Alimta next  week. Patient will get B12 shot today.   Discussed the potential side effects including but not limited to-increasing fatigue, nausea vomiting, diarrhea, hair loss, sores in the mouth, increase risk of infection and also neuropathy.    # MRI brain- multiple strokes; no evidence of PFO on the 2-D echo bubble study. Evaluated by neurology.   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 Patient will likely need indefinite anticoagulation.   #  Follow up in 2 weeks.   # 40 minutes face-to-face with the patient discussing the above plan of care; more than 50% of time spent on prognosis/ natural history; counseling and coordination.     Cammie Sickle, MD 06/20/2016 12:17 PM

## 2016-06-22 ENCOUNTER — Telehealth: Payer: Self-pay | Admitting: *Deleted

## 2016-06-22 NOTE — Telephone Encounter (Signed)
RN contacted [employer]. Prior auth number given to [place] @ [id number]

## 2016-06-22 NOTE — Patient Instructions (Signed)
Pemetrexed injection What is this medicine? PEMETREXED (PEM e TREX ed) is a chemotherapy drug. This medicine affects cells that are rapidly growing, such as cancer cells and cells in your mouth and stomach. It is usually used to treat lung cancers like non-small cell lung cancer and mesothelioma. It may also be used to treat other cancers. This medicine may be used for other purposes; ask your health care provider or pharmacist if you have questions. What should I tell my health care provider before I take this medicine? They need to know if you have any of these conditions: -if you frequently drink alcohol containing beverages -infection (especially a virus infection such as chickenpox, cold sores, or herpes) -kidney disease -liver disease -low blood counts, like low platelets, red bloods, or white blood cells -an unusual or allergic reaction to pemetrexed, mannitol, other medicines, foods, dyes, or preservatives -pregnant or trying to get pregnant -breast-feeding How should I use this medicine? This drug is given as an infusion into a vein. It is administered in a hospital or clinic by a specially trained health care professional. Talk to your pediatrician regarding the use of this medicine in children. Special care may be needed. Overdosage: If you think you have taken too much of this medicine contact a poison control center or emergency room at once. NOTE: This medicine is only for you. Do not share this medicine with others. What if I miss a dose? It is important not to miss your dose. Call your doctor or health care professional if you are unable to keep an appointment. What may interact with this medicine? -aspirin and aspirin-like medicines -medicines to increase blood counts like filgrastim, pegfilgrastim, sargramostim -methotrexate -NSAIDS, medicines for pain and inflammation, like ibuprofen or naproxen -probenecid -pyrimethamine -vaccines Talk to your doctor or health care  professional before taking any of these medicines: -acetaminophen -aspirin -ibuprofen -ketoprofen -naproxen This list may not describe all possible interactions. Give your health care provider a list of all the medicines, herbs, non-prescription drugs, or dietary supplements you use. Also tell them if you smoke, drink alcohol, or use illegal drugs. Some items may interact with your medicine. What should I watch for while using this medicine? Visit your doctor for checks on your progress. This drug may make you feel generally unwell. This is not uncommon, as chemotherapy can affect healthy cells as well as cancer cells. Report any side effects. Continue your course of treatment even though you feel ill unless your doctor tells you to stop. In some cases, you may be given additional medicines to help with side effects. Follow all directions for their use. Call your doctor or health care professional for advice if you get a fever, chills or sore throat, or other symptoms of a cold or flu. Do not treat yourself. This drug decreases your body's ability to fight infections. Try to avoid being around people who are sick. This medicine may increase your risk to bruise or bleed. Call your doctor or health care professional if you notice any unusual bleeding. Be careful brushing and flossing your teeth or using a toothpick because you may get an infection or bleed more easily. If you have any dental work done, tell your dentist you are receiving this medicine. Avoid taking products that contain aspirin, acetaminophen, ibuprofen, naproxen, or ketoprofen unless instructed by your doctor. These medicines may hide a fever. Call your doctor or health care professional if you get diarrhea or mouth sores. Do not treat yourself. To protect your  kidneys, drink water or other fluids as directed while you are taking this medicine. Men and women must use effective birth control while taking this medicine. You may also  need to continue using effective birth control for a time after stopping this medicine. Do not become pregnant while taking this medicine. Tell your doctor right away if you think that you or your partner might be pregnant. There is a potential for serious side effects to an unborn child. Talk to your health care professional or pharmacist for more information. Do not breast-feed an infant while taking this medicine. This medicine may lower sperm counts. What side effects may I notice from receiving this medicine? Side effects that you should report to your doctor or health care professional as soon as possible: -allergic reactions like skin rash, itching or hives, swelling of the face, lips, or tongue -low blood counts - this medicine may decrease the number of white blood cells, red blood cells and platelets. You may be at increased risk for infections and bleeding. -signs of infection - fever or chills, cough, sore throat, pain or difficulty passing urine -signs of decreased platelets or bleeding - bruising, pinpoint red spots on the skin, black, tarry stools, blood in the urine -signs of decreased red blood cells - unusually weak or tired, fainting spells, lightheadedness -breathing problems, like a dry cough -changes in emotions or moods -chest pain -confusion -diarrhea -high blood pressure -mouth or throat sores or ulcers -pain, swelling, warmth in the leg -pain on swallowing -swelling of the ankles, feet, hands -trouble passing urine or change in the amount of urine -vomiting -yellowing of the eyes or skin Side effects that usually do not require medical attention (report to your doctor or health care professional if they continue or are bothersome): -hair loss -loss of appetite -nausea -stomach upset This list may not describe all possible side effects. Call your doctor for medical advice about side effects. You may report side effects to FDA at 1-800-FDA-1088. Where should I keep  my medicine? This drug is given in a hospital or clinic and will not be stored at home. NOTE: This sheet is a summary. It may not cover all possible information. If you have questions about this medicine, talk to your doctor, pharmacist, or health care provider.    2016, Elsevier/Gold Standard. (2008-03-27 13:24:03) Carboplatin injection What is this medicine? CARBOPLATIN (KAR boe pla tin) is a chemotherapy drug. It targets fast dividing cells, like cancer cells, and causes these cells to die. This medicine is used to treat ovarian cancer and many other cancers. This medicine may be used for other purposes; ask your health care provider or pharmacist if you have questions. What should I tell my health care provider before I take this medicine? They need to know if you have any of these conditions: -blood disorders -hearing problems -kidney disease -recent or ongoing radiation therapy -an unusual or allergic reaction to carboplatin, cisplatin, other chemotherapy, other medicines, foods, dyes, or preservatives -pregnant or trying to get pregnant -breast-feeding How should I use this medicine? This drug is usually given as an infusion into a vein. It is administered in a hospital or clinic by a specially trained health care professional. Talk to your pediatrician regarding the use of this medicine in children. Special care may be needed. Overdosage: If you think you have taken too much of this medicine contact a poison control center or emergency room at once. NOTE: This medicine is only for you. Do not share  this medicine with others. What if I miss a dose? It is important not to miss a dose. Call your doctor or health care professional if you are unable to keep an appointment. What may interact with this medicine? -medicines for seizures -medicines to increase blood counts like filgrastim, pegfilgrastim, sargramostim -some antibiotics like amikacin, gentamicin, neomycin, streptomycin,  tobramycin -vaccines Talk to your doctor or health care professional before taking any of these medicines: -acetaminophen -aspirin -ibuprofen -ketoprofen -naproxen This list may not describe all possible interactions. Give your health care provider a list of all the medicines, herbs, non-prescription drugs, or dietary supplements you use. Also tell them if you smoke, drink alcohol, or use illegal drugs. Some items may interact with your medicine. What should I watch for while using this medicine? Your condition will be monitored carefully while you are receiving this medicine. You will need important blood work done while you are taking this medicine. This drug may make you feel generally unwell. This is not uncommon, as chemotherapy can affect healthy cells as well as cancer cells. Report any side effects. Continue your course of treatment even though you feel ill unless your doctor tells you to stop. In some cases, you may be given additional medicines to help with side effects. Follow all directions for their use. Call your doctor or health care professional for advice if you get a fever, chills or sore throat, or other symptoms of a cold or flu. Do not treat yourself. This drug decreases your body's ability to fight infections. Try to avoid being around people who are sick. This medicine may increase your risk to bruise or bleed. Call your doctor or health care professional if you notice any unusual bleeding. Be careful brushing and flossing your teeth or using a toothpick because you may get an infection or bleed more easily. If you have any dental work done, tell your dentist you are receiving this medicine. Avoid taking products that contain aspirin, acetaminophen, ibuprofen, naproxen, or ketoprofen unless instructed by your doctor. These medicines may hide a fever. Do not become pregnant while taking this medicine. Women should inform their doctor if they wish to become pregnant or think  they might be pregnant. There is a potential for serious side effects to an unborn child. Talk to your health care professional or pharmacist for more information. Do not breast-feed an infant while taking this medicine. What side effects may I notice from receiving this medicine? Side effects that you should report to your doctor or health care professional as soon as possible: -allergic reactions like skin rash, itching or hives, swelling of the face, lips, or tongue -signs of infection - fever or chills, cough, sore throat, pain or difficulty passing urine -signs of decreased platelets or bleeding - bruising, pinpoint red spots on the skin, black, tarry stools, nosebleeds -signs of decreased red blood cells - unusually weak or tired, fainting spells, lightheadedness -breathing problems -changes in hearing -changes in vision -chest pain -high blood pressure -low blood counts - This drug may decrease the number of white blood cells, red blood cells and platelets. You may be at increased risk for infections and bleeding. -nausea and vomiting -pain, swelling, redness or irritation at the injection site -pain, tingling, numbness in the hands or feet -problems with balance, talking, walking -trouble passing urine or change in the amount of urine Side effects that usually do not require medical attention (report to your doctor or health care professional if they continue or are bothersome): -  hair loss -loss of appetite -metallic taste in the mouth or changes in taste This list may not describe all possible side effects. Call your doctor for medical advice about side effects. You may report side effects to FDA at 1-800-FDA-1088. Where should I keep my medicine? This drug is given in a hospital or clinic and will not be stored at home. NOTE: This sheet is a summary. It may not cover all possible information. If you have questions about this medicine, talk to your doctor, pharmacist, or health care  provider.    2016, Elsevier/Gold Standard. (2007-11-29 14:38:05)

## 2016-06-22 NOTE — Telephone Encounter (Signed)
Contacted [employer] for prior auth for the Xalkori at 1 [phone]. Spoke with Maudie Mercury.  Case # given [id number].  Patient has a $90 copay for 30 days supply. [employer] attempting to provide patient with drug assistance- however prior auth needed to be initiated first. Insurance would not speak to [employer] re: prior [place]. Transferred to [facility], RN to complete the remainder of clinical information.

## 2016-06-23 ENCOUNTER — Inpatient Hospital Stay: Payer: Managed Care, Other (non HMO)

## 2016-06-23 ENCOUNTER — Inpatient Hospital Stay (HOSPITAL_BASED_OUTPATIENT_CLINIC_OR_DEPARTMENT_OTHER): Payer: Managed Care, Other (non HMO) | Admitting: Internal Medicine

## 2016-06-23 ENCOUNTER — Telehealth: Payer: Self-pay | Admitting: *Deleted

## 2016-06-23 ENCOUNTER — Encounter: Payer: Self-pay | Admitting: Internal Medicine

## 2016-06-23 VITALS — BP 137/90 | HR 96

## 2016-06-23 DIAGNOSIS — H538 Other visual disturbances: Secondary | ICD-10-CM | POA: Insufficient documentation

## 2016-06-23 DIAGNOSIS — E278 Other specified disorders of adrenal gland: Secondary | ICD-10-CM

## 2016-06-23 DIAGNOSIS — Z86010 Personal history of colonic polyps: Secondary | ICD-10-CM

## 2016-06-23 DIAGNOSIS — R972 Elevated prostate specific antigen [PSA]: Secondary | ICD-10-CM

## 2016-06-23 DIAGNOSIS — C3412 Malignant neoplasm of upper lobe, left bronchus or lung: Secondary | ICD-10-CM | POA: Diagnosis not present

## 2016-06-23 DIAGNOSIS — C3490 Malignant neoplasm of unspecified part of unspecified bronchus or lung: Secondary | ICD-10-CM

## 2016-06-23 DIAGNOSIS — C770 Secondary and unspecified malignant neoplasm of lymph nodes of head, face and neck: Secondary | ICD-10-CM | POA: Diagnosis not present

## 2016-06-23 DIAGNOSIS — Z7984 Long term (current) use of oral hypoglycemic drugs: Secondary | ICD-10-CM

## 2016-06-23 DIAGNOSIS — K429 Umbilical hernia without obstruction or gangrene: Secondary | ICD-10-CM

## 2016-06-23 DIAGNOSIS — N281 Cyst of kidney, acquired: Secondary | ICD-10-CM

## 2016-06-23 DIAGNOSIS — Z8042 Family history of malignant neoplasm of prostate: Secondary | ICD-10-CM

## 2016-06-23 DIAGNOSIS — I2699 Other pulmonary embolism without acute cor pulmonale: Secondary | ICD-10-CM | POA: Diagnosis not present

## 2016-06-23 DIAGNOSIS — K5730 Diverticulosis of large intestine without perforation or abscess without bleeding: Secondary | ICD-10-CM

## 2016-06-23 DIAGNOSIS — E042 Nontoxic multinodular goiter: Secondary | ICD-10-CM

## 2016-06-23 DIAGNOSIS — Z86718 Personal history of other venous thrombosis and embolism: Secondary | ICD-10-CM

## 2016-06-23 DIAGNOSIS — H7091 Unspecified mastoiditis, right ear: Secondary | ICD-10-CM

## 2016-06-23 DIAGNOSIS — I639 Cerebral infarction, unspecified: Secondary | ICD-10-CM

## 2016-06-23 DIAGNOSIS — Z7982 Long term (current) use of aspirin: Secondary | ICD-10-CM

## 2016-06-23 DIAGNOSIS — Z86711 Personal history of pulmonary embolism: Secondary | ICD-10-CM

## 2016-06-23 DIAGNOSIS — E785 Hyperlipidemia, unspecified: Secondary | ICD-10-CM

## 2016-06-23 DIAGNOSIS — Z7901 Long term (current) use of anticoagulants: Secondary | ICD-10-CM

## 2016-06-23 DIAGNOSIS — I6340 Cerebral infarction due to embolism of unspecified cerebral artery: Secondary | ICD-10-CM

## 2016-06-23 DIAGNOSIS — I10 Essential (primary) hypertension: Secondary | ICD-10-CM

## 2016-06-23 DIAGNOSIS — N400 Enlarged prostate without lower urinary tract symptoms: Secondary | ICD-10-CM

## 2016-06-23 DIAGNOSIS — E119 Type 2 diabetes mellitus without complications: Secondary | ICD-10-CM

## 2016-06-23 DIAGNOSIS — R0789 Other chest pain: Secondary | ICD-10-CM

## 2016-06-23 DIAGNOSIS — Z79899 Other long term (current) drug therapy: Secondary | ICD-10-CM

## 2016-06-23 DIAGNOSIS — K649 Unspecified hemorrhoids: Secondary | ICD-10-CM

## 2016-06-23 DIAGNOSIS — I2782 Chronic pulmonary embolism: Secondary | ICD-10-CM

## 2016-06-23 DIAGNOSIS — E538 Deficiency of other specified B group vitamins: Secondary | ICD-10-CM

## 2016-06-23 DIAGNOSIS — E7800 Pure hypercholesterolemia, unspecified: Secondary | ICD-10-CM

## 2016-06-23 DIAGNOSIS — R590 Localized enlarged lymph nodes: Secondary | ICD-10-CM

## 2016-06-23 MED ORDER — ASPIRIN 81 MG PO CHEW: 81.0000 mg | CHEWABLE_TABLET | Freq: Every day | ORAL | Status: DC

## 2016-06-23 NOTE — Progress Notes (Signed)
Patient presented to clinic today for new patient orientation class.  The patient c/o vision changes and lights flashing in his left eye since Sunday.  The patient requested to see a provider for assessment.  Dr. Tish Men is currently in the [facility] clinic and Dr. Junius Finner is on call and agreed to see the patient.  Dr. Darlin Priestly nurse, Notified Renita Papa, RN regarding the patient situation.  She will let Dr. Rogue Bussing know.

## 2016-06-23 NOTE — Telephone Encounter (Signed)
Contacted [facility]ology together group to f/u on shipment of Xalkori to confirm when drug will be shipped to patient.(case number: [id number]).  Spoke with Caryl Asp at [facility].  Was transferred to [facility] in oncology [facility] together.    [employer] attempted to reach out for copay card information this morning. Unable to reach patient per Erasmo Downer.  I asked if I could help provide assistance to enroll the patient in the copay card program.  She will fax the copay card to the cancer ctr at [phone] and will also mail a copy of the copay card to the patient.  BIN [id number] Group [id number] ID: [id number]  I personally contacted [employer] at [phone] - option #2.  I was transferred to the copay assistance program. Had to leave a vm. I was attempting to get the drug processed asap to have shipment sent asap.

## 2016-06-24 ENCOUNTER — Emergency Department: Payer: Managed Care, Other (non HMO)

## 2016-06-24 ENCOUNTER — Encounter: Payer: Self-pay | Admitting: Emergency Medicine

## 2016-06-24 ENCOUNTER — Inpatient Hospital Stay
Admission: EM | Admit: 2016-06-24 | Discharge: 2016-06-26 | DRG: 064 | Disposition: A | Discharge status: Home / Self Care | Payer: Managed Care, Other (non HMO) | Attending: Internal Medicine | Admitting: Internal Medicine

## 2016-06-24 ENCOUNTER — Other Ambulatory Visit: Payer: Self-pay | Admitting: Internal Medicine

## 2016-06-24 ENCOUNTER — Telehealth: Payer: Self-pay | Admitting: *Deleted

## 2016-06-24 DIAGNOSIS — C3490 Malignant neoplasm of unspecified part of unspecified bronchus or lung: Secondary | ICD-10-CM | POA: Diagnosis not present

## 2016-06-24 DIAGNOSIS — I10 Essential (primary) hypertension: Secondary | ICD-10-CM | POA: Diagnosis present

## 2016-06-24 DIAGNOSIS — Z86711 Personal history of pulmonary embolism: Secondary | ICD-10-CM

## 2016-06-24 DIAGNOSIS — I6340 Cerebral infarction due to embolism of unspecified cerebral artery: Principal | ICD-10-CM | POA: Diagnosis present

## 2016-06-24 DIAGNOSIS — I82409 Acute embolism and thrombosis of unspecified deep veins of unspecified lower extremity: Secondary | ICD-10-CM | POA: Diagnosis present

## 2016-06-24 DIAGNOSIS — Z794 Long term (current) use of insulin: Secondary | ICD-10-CM

## 2016-06-24 DIAGNOSIS — R202 Paresthesia of skin: Secondary | ICD-10-CM

## 2016-06-24 DIAGNOSIS — R748 Abnormal levels of other serum enzymes: Secondary | ICD-10-CM

## 2016-06-24 DIAGNOSIS — C799 Secondary malignant neoplasm of unspecified site: Secondary | ICD-10-CM | POA: Diagnosis present

## 2016-06-24 DIAGNOSIS — Z86010 Personal history of colonic polyps: Secondary | ICD-10-CM

## 2016-06-24 DIAGNOSIS — Z9221 Personal history of antineoplastic chemotherapy: Secondary | ICD-10-CM

## 2016-06-24 DIAGNOSIS — Z79899 Other long term (current) drug therapy: Secondary | ICD-10-CM

## 2016-06-24 DIAGNOSIS — K5730 Diverticulosis of large intestine without perforation or abscess without bleeding: Secondary | ICD-10-CM

## 2016-06-24 DIAGNOSIS — K649 Unspecified hemorrhoids: Secondary | ICD-10-CM

## 2016-06-24 DIAGNOSIS — I2699 Other pulmonary embolism without acute cor pulmonale: Secondary | ICD-10-CM

## 2016-06-24 DIAGNOSIS — G629 Polyneuropathy, unspecified: Secondary | ICD-10-CM | POA: Diagnosis not present

## 2016-06-24 DIAGNOSIS — R7989 Other specified abnormal findings of blood chemistry: Secondary | ICD-10-CM | POA: Diagnosis not present

## 2016-06-24 DIAGNOSIS — E669 Obesity, unspecified: Secondary | ICD-10-CM

## 2016-06-24 DIAGNOSIS — R29810 Facial weakness: Secondary | ICD-10-CM | POA: Diagnosis present

## 2016-06-24 DIAGNOSIS — Z7901 Long term (current) use of anticoagulants: Secondary | ICD-10-CM

## 2016-06-24 DIAGNOSIS — R4781 Slurred speech: Secondary | ICD-10-CM | POA: Diagnosis present

## 2016-06-24 DIAGNOSIS — Z8673 Personal history of transient ischemic attack (TIA), and cerebral infarction without residual deficits: Secondary | ICD-10-CM | POA: Diagnosis present

## 2016-06-24 DIAGNOSIS — I639 Cerebral infarction, unspecified: Secondary | ICD-10-CM | POA: Diagnosis not present

## 2016-06-24 DIAGNOSIS — E119 Type 2 diabetes mellitus without complications: Secondary | ICD-10-CM

## 2016-06-24 DIAGNOSIS — E785 Hyperlipidemia, unspecified: Secondary | ICD-10-CM | POA: Diagnosis present

## 2016-06-24 DIAGNOSIS — R778 Other specified abnormalities of plasma proteins: Secondary | ICD-10-CM

## 2016-06-24 DIAGNOSIS — E7800 Pure hypercholesterolemia, unspecified: Secondary | ICD-10-CM

## 2016-06-24 DIAGNOSIS — R972 Elevated prostate specific antigen [PSA]: Secondary | ICD-10-CM

## 2016-06-24 DIAGNOSIS — C3412 Malignant neoplasm of upper lobe, left bronchus or lung: Secondary | ICD-10-CM | POA: Diagnosis present

## 2016-06-24 DIAGNOSIS — Z7984 Long term (current) use of oral hypoglycemic drugs: Secondary | ICD-10-CM

## 2016-06-24 DIAGNOSIS — R200 Anesthesia of skin: Secondary | ICD-10-CM | POA: Diagnosis present

## 2016-06-24 DIAGNOSIS — Z809 Family history of malignant neoplasm, unspecified: Secondary | ICD-10-CM

## 2016-06-24 LAB — URINE DRUG SCREEN, QUALITATIVE (ARMC ONLY)
Amphetamines, Ur Screen: NOT DETECTED
BARBITURATES, UR SCREEN: NOT DETECTED
Benzodiazepine, Ur Scrn: NOT DETECTED
CANNABINOID 50 NG, UR ~~LOC~~: NOT DETECTED
COCAINE METABOLITE, UR ~~LOC~~: NOT DETECTED
MDMA (Ecstasy)Ur Screen: NOT DETECTED
Methadone Scn, Ur: NOT DETECTED
OPIATE, UR SCREEN: NOT DETECTED
Phencyclidine (PCP) Ur S: NOT DETECTED
TRICYCLIC, UR SCREEN: NOT DETECTED

## 2016-06-24 LAB — COMPREHENSIVE METABOLIC PANEL
ALBUMIN: 3.8 g/dL (ref 3.5–5.0)
ALK PHOS: 77 U/L (ref 38–126)
ALT: 41 U/L (ref 17–63)
AST: 25 U/L (ref 15–41)
Anion gap: 9 (ref 5–15)
BUN: 16 mg/dL (ref 6–20)
CALCIUM: 9.7 mg/dL (ref 8.9–10.3)
CO2: 24 mmol/L (ref 22–32)
CREATININE: 1.07 mg/dL (ref 0.61–1.24)
Chloride: 101 mmol/L (ref 101–111)
GFR calc Af Amer: >60 mL/min (ref >60)
GFR calc non Af Amer: >60 mL/min (ref >60)
GLUCOSE: 151 mg/dL — AB (ref 65–99)
Potassium: 4.6 mmol/L (ref 3.5–5.1)
SODIUM: 134 mmol/L — AB (ref 135–145)
Total Bilirubin: 0.8 mg/dL (ref 0.3–1.2)
Total Protein: 7.5 g/dL (ref 6.5–8.1)

## 2016-06-24 LAB — DIFFERENTIAL
BASOS ABS: 0 10*3/uL (ref 0–0.1)
Basophils Relative: 0 %
EOS ABS: 0.1 10*3/uL (ref 0–0.7)
Eosinophils Relative: 2 %
LYMPHS ABS: 0.9 10*3/uL — AB (ref 1.0–3.6)
LYMPHS PCT: 11 %
Monocytes Absolute: 0.6 10*3/uL (ref 0.2–1.0)
Monocytes Relative: 8 %
NEUTROS ABS: 6.7 10*3/uL — AB (ref 1.4–6.5)
NEUTROS PCT: 79 %

## 2016-06-24 LAB — URINALYSIS COMPLETE WITH MICROSCOPIC (ARMC ONLY)
BILIRUBIN URINE: NEGATIVE
GLUCOSE, UA: 50 mg/dL — AB
Ketones, ur: NEGATIVE mg/dL
NITRITE: NEGATIVE
PH: 6 (ref 5.0–8.0)
Protein, ur: NEGATIVE mg/dL
SPECIFIC GRAVITY, URINE: 1.008 (ref 1.005–1.030)
Squamous Epithelial / LPF: NONE SEEN

## 2016-06-24 LAB — ETHANOL: Alcohol, Ethyl (B): <5 mg/dL (ref <5)

## 2016-06-24 LAB — TROPONIN I
Troponin I: 0.73 ng/mL (ref <0.03)
Troponin I: 1.4 ng/mL (ref <0.03)
Troponin I: 2.73 ng/mL (ref <0.03)
Troponin I: 2.75 ng/mL (ref <0.03)

## 2016-06-24 LAB — CBC
HEMATOCRIT: 35.9 % — AB (ref 40.0–52.0)
HEMOGLOBIN: 12.4 g/dL — AB (ref 13.0–18.0)
MCH: 28.3 pg (ref 26.0–34.0)
MCHC: 34.6 g/dL (ref 32.0–36.0)
MCV: 81.8 fL (ref 80.0–100.0)
Platelets: 293 10*3/uL (ref 150–440)
RBC: 4.38 MIL/uL — AB (ref 4.40–5.90)
RDW: 14.3 % (ref 11.5–14.5)
WBC: 8.4 10*3/uL (ref 3.8–10.6)

## 2016-06-24 LAB — PROTIME-INR
INR: 2.43
PROTHROMBIN TIME: 26.9 s — AB (ref 11.4–15.2)

## 2016-06-24 LAB — GLUCOSE, CAPILLARY
GLUCOSE-CAPILLARY: 163 mg/dL — AB (ref 65–99)
GLUCOSE-CAPILLARY: 166 mg/dL — AB (ref 65–99)
GLUCOSE-CAPILLARY: 164 mg/dL — AB (ref 65–99)

## 2016-06-24 LAB — APTT: aPTT: 31 seconds (ref 24–36)

## 2016-06-24 MED ORDER — INSULIN ASPART 100 UNIT/ML ~~LOC~~ SOLN
0.0000 [IU] | Freq: Every day | SUBCUTANEOUS | Status: DC
  Administered 2016-06-25: 5 [IU] via SUBCUTANEOUS
  Filled 2016-06-24: qty 5

## 2016-06-24 MED ORDER — INSULIN ASPART 100 UNIT/ML ~~LOC~~ SOLN
0.0000 [IU] | Freq: Three times a day (TID) | SUBCUTANEOUS | Status: DC
  Administered 2016-06-24 (×2): 2 [IU] via SUBCUTANEOUS
  Administered 2016-06-25: 13:00:00 5 [IU] via SUBCUTANEOUS
  Administered 2016-06-25: 17:00:00 3 [IU] via SUBCUTANEOUS
  Administered 2016-06-25: 08:00:00 2 [IU] via SUBCUTANEOUS
  Administered 2016-06-26: 3 [IU] via SUBCUTANEOUS
  Administered 2016-06-26: 9 [IU] via SUBCUTANEOUS
  Filled 2016-06-24: qty 9
  Filled 2016-06-24: qty 2
  Filled 2016-06-24: qty 5
  Filled 2016-06-24: qty 3
  Filled 2016-06-24 (×2): qty 2
  Filled 2016-06-24: qty 3

## 2016-06-24 MED ORDER — ASPIRIN EC 81 MG PO TBEC
81.0000 mg | DELAYED_RELEASE_TABLET | Freq: Every day | ORAL | Status: DC
  Administered 2016-06-25 – 2016-06-26 (×2): 81 mg via ORAL
  Filled 2016-06-24 (×2): qty 1

## 2016-06-24 MED ORDER — RIVAROXABAN 15 MG PO TABS
15.0000 mg | ORAL_TABLET | Freq: Two times a day (BID) | ORAL | Status: DC
  Administered 2016-06-24: 15 mg via ORAL
  Filled 2016-06-24 (×2): qty 1

## 2016-06-24 MED ORDER — LISINOPRIL 10 MG PO TABS
10.0000 mg | ORAL_TABLET | Freq: Every day | ORAL | Status: DC
  Administered 2016-06-24 – 2016-06-26 (×3): 10 mg via ORAL
  Filled 2016-06-24 (×3): qty 1

## 2016-06-24 MED ORDER — ONDANSETRON HCL 4 MG PO TABS: 4.0000 mg | ORAL_TABLET | Freq: Four times a day (QID) | ORAL | Status: DC | PRN

## 2016-06-24 MED ORDER — TAMSULOSIN HCL 0.4 MG PO CAPS
0.4000 mg | ORAL_CAPSULE | Freq: Every morning | ORAL | Status: DC
  Administered 2016-06-24 – 2016-06-26 (×3): 0.4 mg via ORAL
  Filled 2016-06-24 (×3): qty 1

## 2016-06-24 MED ORDER — ENOXAPARIN SODIUM 80 MG/0.8ML ~~LOC~~ SOLN
65.0000 mg | Freq: Two times a day (BID) | SUBCUTANEOUS | Status: DC
  Administered 2016-06-24 – 2016-06-26 (×4): 65 mg via SUBCUTANEOUS
  Filled 2016-06-24 (×5): qty 0.8

## 2016-06-24 MED ORDER — SODIUM CHLORIDE 0.9% FLUSH
3.0000 mL | Freq: Two times a day (BID) | INTRAVENOUS | Status: DC
  Administered 2016-06-24 – 2016-06-26 (×5): 3 mL via INTRAVENOUS

## 2016-06-24 MED ORDER — ACETAMINOPHEN 325 MG PO TABS
650.0000 mg | ORAL_TABLET | Freq: Four times a day (QID) | ORAL | Status: DC | PRN
  Administered 2016-06-24: 12:00:00 650 mg via ORAL
  Filled 2016-06-24: qty 2

## 2016-06-24 MED ORDER — ACETAMINOPHEN 650 MG RE SUPP: 650.0000 mg | Freq: Four times a day (QID) | RECTAL | Status: DC | PRN

## 2016-06-24 MED ORDER — STROKE: EARLY STAGES OF RECOVERY BOOK
Freq: Once | Status: AC
  Administered 2016-06-24: 10:00:00

## 2016-06-24 MED ORDER — ONDANSETRON HCL 4 MG/2ML IJ SOLN: 4.0000 mg | Freq: Four times a day (QID) | INTRAMUSCULAR | Status: DC | PRN

## 2016-06-24 MED ORDER — ASPIRIN 300 MG RE SUPP: 300.0000 mg | Freq: Every day | RECTAL | Status: DC

## 2016-06-24 MED ORDER — ASPIRIN 325 MG PO TABS
325.0000 mg | ORAL_TABLET | Freq: Every day | ORAL | Status: DC
  Administered 2016-06-24: 325 mg via ORAL
  Filled 2016-06-24 (×2): qty 1

## 2016-06-24 MED ORDER — OXYCODONE HCL 5 MG PO TABS: 5.0000 mg | ORAL_TABLET | ORAL | Status: DC | PRN

## 2016-06-24 MED ORDER — FINASTERIDE 5 MG PO TABS
5.0000 mg | ORAL_TABLET | Freq: Every day | ORAL | Status: DC
  Administered 2016-06-24 – 2016-06-25 (×2): 5 mg via ORAL
  Filled 2016-06-24 (×2): qty 1

## 2016-06-24 MED ORDER — ATORVASTATIN CALCIUM 20 MG PO TABS
40.0000 mg | ORAL_TABLET | Freq: Every day | ORAL | Status: DC
  Administered 2016-06-24 – 2016-06-26 (×3): 40 mg via ORAL
  Filled 2016-06-24 (×3): qty 2

## 2016-06-24 NOTE — Progress Notes (Signed)
Patient admitted to the hospital for stroke; new strokes noted on the CT scan. Unable to have Crizotinib be available to the patient because of insurance issues. Started on chemotherapy with carboplatin and Alimta ASAP/tomorrow. Patient received B12 injection. Recommend switching from xarelto to Lovenox; and continue asprin. Paged Dr.Hower to discuss my plan.

## 2016-06-24 NOTE — ED Triage Notes (Addendum)
Pt presents to ED with c/o sudden onset of left arm pain and numbness in addition to difficulty swallowing and speaking around 0130 this morning.  Pt states he currently has lung CA, which has caused pt to develop numerous blood clots. Pt is preparing to start tx for the same. Pt reports he was told he has experienced multiple mini-strokes due to the blood clots. Currently has very little numbness and a slight ache to the affected arm. Pt states his slurred speech has resolved.

## 2016-06-24 NOTE — Telephone Encounter (Signed)
Wife called back to cancer center. States that she was informed by [employer] that prior auth had not yet been informed. She needs further assistance and help with speaking to [employer].  I returned wife's call and explained that I would contact [employer] asap to resolve this concern.    I contacted [employer]. I explained to [employer] rep that the copay relief card was faxed this morning at 8am. I also called [employer] yesterday to inform them of the copay card information and asked [employer] to expedite this rx asap.  I also explained that prior was already obtained on 06/22/16 with insurance.

## 2016-06-24 NOTE — Consult Note (Signed)
Cardiology Consultation Note  Patient ID: Melvin Willis, MRN: [id number], DOB/AGE: 01/19/1951 65 y.o. Admit date: 06/24/2016   Date of Consult: 06/24/2016 Primary Physician: Keith Rake, MD Primary Cardiologist: Dr. Fletcher Anon, MD Requesting Physician: Dr. Lavetta Nielsen, MD  Chief Complaint: Unilateral weakness and slurred speech  Reason for Consult: Elevated troponin in the setting of a CVA  HPI: 65 y.o. male with h/o metastatic lung cancer, DVT/PE on Xarelto, recent stroke one week ago, HLD, HTN, and DM2 who presented to [facility] on 06/23/16 with left-hand numbness and slurred and was found to have recurrent stroke.   He was recently seen by Dr. Fletcher Anon on 06/08/2016 for evaluation of chest pain after admission in September in which CT scan of the chest showed bilateral PE and large lung cancer with mets to the liver on Xarelto. Echo that admission showed normal LV systolic function with no significant valvular abnormalities with no evidence of pericardial effusion. Before the above illness he was completely asymptomatic. His chest pain was felt to be 2/2 PE and lung mass. He was subsequently admitted to [facility] on 10/11 with CVA on MRI c/w pattern of embolization. He returned to [facility] 1 week later, today, with sudden onset at 32 of left hand numbness and slurred speech and was found to have evidence of new infarcts on CT head today, now back to baseline. He has been seen by neurology who advised continuing Xarelto and adding ASA. Oncology has advised Lovenox over Xarelto. Cardiology is asked to see patient given troponin of 1.4 in the setting of acute stroke. Never with further chest pain. EKG non-acute. UDS negative.     Past Medical History:  Diagnosis Date  . Abnormal prostate specific antigen 08/08/2012  . Adiposity 04/16/2015  . CVA (cerebral vascular accident) ([place]) 06/17/2016  . Diabetes mellitus without complication ([place])   . Diverticulosis of sigmoid colon 04/16/2015  . Dyslipidemia 03/18/2015  .  Hemorrhoids, internal 04/16/2015  . Hypercholesteremia 04/16/2015  . Hyperlipidemia   . Hypertension   . Primary cancer of left upper lobe of lung ([place])   . Pulmonary embolism ([place])   . Wears dentures    partial upper      Most Recent Cardiac Studies: As above   Surgical History:  Past Surgical History:  Procedure Laterality Date  . COLONOSCOPY    . COLONOSCOPY WITH PROPOFOL N/A 05/06/2015   Procedure: COLONOSCOPY WITH PROPOFOL;  Surgeon: Lucilla Lame, MD;  Location: [place];  Service: Endoscopy;  Laterality: N/A;  ASCENDING COLON POLYPS X 2 TERMINAL ILEUM BIOPSY RANDOM COLON BX. TRANSVERSE COLON POLYP SIGMOID COLON POLYP  . ESOPHAGOGASTRODUODENOSCOPY (EGD) WITH PROPOFOL N/A 05/06/2015   Procedure: ESOPHAGOGASTRODUODENOSCOPY (EGD) WITH PROPOFOL;  Surgeon: Lucilla Lame, MD;  Location: [place];  Service: Endoscopy;  Laterality: N/A;  GASTRIC BIOPSY X1     Home Meds: Prior to Admission medications   Medication Sig Start Date End Date Taking? Authorizing Provider  atorvastatin (LIPITOR) 40 MG tablet Take 40 mg by mouth daily.   Yes Historical Provider, MD  finasteride (PROSCAR) 5 MG tablet Take 5 mg by mouth at bedtime.  03/04/15  Yes Historical Provider, MD  glipiZIDE (GLUCOTROL) 5 MG tablet Take 1 tablet (5 mg total) by mouth 2 (two) times daily before a meal. 04/03/16  Yes Roselee Nova, MD  lisinopril (PRINIVIL,ZESTRIL) 10 MG tablet TAKE ONE TABLET BY MOUTH ONCE DAILY 06/15/16  Yes Roselee Nova, MD  metFORMIN (GLUCOPHAGE) 1000 MG tablet Take 0.5 tablets (500 mg  total) by mouth 2 (two) times daily. 04/03/16  Yes Roselee Nova, MD  tamsulosin (FLOMAX) 0.4 MG CAPS capsule Take 0.4 mg by mouth every morning.  01/24/15  Yes Historical Provider, MD  XARELTO 15 MG TABS tablet Take 15 mg by mouth 2 (two) times daily. 06/06/16  Yes Historical Provider, MD  crizotinib Hulda Humphrey) 250 MG capsule Take 1 capsule (250 mg total) by mouth 2 (two) times daily. Patient not  taking: Reported on 06/24/2016 06/19/16   Cammie Sickle, MD    Inpatient Medications:  . [START ON 06/25/2016] aspirin EC  81 mg Oral Daily  . atorvastatin  40 mg Oral Daily  . finasteride  5 mg Oral QHS  . insulin aspart  0-5 Units Subcutaneous QHS  . insulin aspart  0-9 Units Subcutaneous TID WC  . lisinopril  10 mg Oral Daily  . sodium chloride flush  3 mL Intravenous Q12H  . tamsulosin  0.4 mg Oral q morning - 10a      Allergies: No Known Allergies  Social History   Social History  . Marital status: Married    Spouse name: N/A  . Number of children: N/A  . Years of education: N/A   Occupational History  . Not on file.   Social History Main Topics  . Smoking status: Never Smoker  . Smokeless tobacco: Never Used  . Alcohol use No  . Drug use: No  . Sexual activity: Yes    Partners: Female   Other Topics Concern  . Not on file   Social History Narrative  . No narrative on file     Family History  Problem Relation Age of Onset  . Diabetes Mother   . Diabetes Father   . CAD Father   . Dementia Father   . Diabetes Sister   . Cancer Maternal Uncle     Prostate  . Cancer Cousin     prostate     Review of Systems: Review of Systems  Constitutional: Positive for malaise/fatigue. Negative for chills, diaphoresis, fever and weight loss.  HENT: Negative for congestion.   Eyes: Negative for discharge and redness.  Respiratory: Negative for cough, hemoptysis, sputum production, shortness of breath and wheezing.   Cardiovascular: Negative for chest pain, palpitations, orthopnea, claudication, leg swelling and PND.  Gastrointestinal: Negative for abdominal pain, blood in stool, heartburn, melena, nausea and vomiting.  Genitourinary: Negative for hematuria.  Musculoskeletal: Negative for falls and myalgias.  Skin: Negative for rash.  Neurological: Positive for sensory change, speech change and focal weakness. Negative for dizziness, tingling, tremors, loss  of consciousness and weakness.  Endo/Heme/Allergies: Does not bruise/bleed easily.  Psychiatric/Behavioral: Negative for substance abuse. The patient is not nervous/anxious.   All other systems reviewed and are negative.   Labs:  Recent Labs  06/24/16 0632 06/24/16 1038  TROPONINI 0.73* 1.40*   Lab Results  Component Value Date   WBC 8.4 06/24/2016   HGB 12.4 (L) 06/24/2016   HCT 35.9 (L) 06/24/2016   MCV 81.8 06/24/2016   PLT 293 06/24/2016     Recent Labs Lab 06/24/16 0632  NA 134*  K 4.6  CL 101  CO2 24  BUN 16  CREATININE 1.07  CALCIUM 9.7  PROT 7.5  BILITOT 0.8  ALKPHOS 77  ALT 41  AST 25  GLUCOSE 151*   Lab Results  Component Value Date   CHOL 165 04/03/2016   HDL 50 04/03/2016   LDLCALC 101 04/03/2016   TRIG 72  04/03/2016   No results found for: DDIMER  Radiology/Studies:  Ct Angio Head W Or Wo Contrast  Result Date: 06/17/2016 CLINICAL DATA:  Stroke on MRI earlier today. EXAM: CT ANGIOGRAPHY HEAD AND NECK TECHNIQUE: Multidetector CT imaging of the head and neck was performed using the standard protocol during bolus administration of intravenous contrast. Multiplanar CT image reconstructions and MIPs were obtained to evaluate the vascular anatomy. Carotid stenosis measurements (when applicable) are obtained utilizing NASCET criteria, using the distal internal carotid diameter as the denominator. CONTRAST:  Study 5 cc Isovue 370 intravenous COMPARISON:  Brain MRI from earlier today FINDINGS: CT HEAD FINDINGS Brain: Known acute infarcts are not clearly visible. No hemorrhage, hydrocephalus, or shift. Vascular: No hyperdense vessel or unexpected calcification. Skull: 7 mm lucency in the lateral right orbital rim, which was described on brain MRI from earlier today. CT features favor a benign process. Sinuses: Right mastoiditis. Orbits: No acute finding Review of the MIP images confirms the above findings CTA NECK FINDINGS Aortic arch: Normal size and  appearance.  Three vessel branching. Right carotid system: Mild atherosclerotic plaque at the carotid bifurcation without stenosis or dissection. No ulceration. Left carotid system: Mild atherosclerotic calcification at the bifurcation. No stenosis, dissection, or ulceration. Vertebral arteries: No proximal subclavian artery stenosis. Codominant vertebral arteries are smooth and widely patent to the dura. Skeleton: No acute or aggressive finding in the neck. Other neck: Known adenopathy at the thoracic inlet and left supraclavicular fossa. Multi nodular goiter. Upper chest: Known left upper lobe malignancy with volume loss. No pulmonary emboli, partly seen on the left today. Review of the MIP images confirms the above findings CTA HEAD FINDINGS Anterior circulation: Symmetric carotid arteries. Symmetric branching. No major vessel occlusion, stenosis, or aneurysm. Posterior circulation:  Standard anatomy Venous sinuses: As permitted by contrast timing, patent. Anatomic variants: None significant Delayed phase: Posterior right frontal enhancement seen on brain MRI from earlier today may be subtly visible. Review of the MIP images confirms the above findings IMPRESSION: 1. Known multiple acute infarcts by MRI. No major vessel occlusion, flow limiting stenosis, or visible arterial embolic source. In this patient with recent pulmonary embolism, recommend workup for PFO. 2. Mild atherosclerosis. 3. 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 Electronically Signed   By: Monte Fantasia M.D.   On: 06/17/2016 20:23   Ct Head Wo Contrast  Result Date: 06/24/2016 CLINICAL DATA:  Slurred speech.  Left arm numbness.  Lung cancer. EXAM: CT HEAD WITHOUT CONTRAST TECHNIQUE: Contiguous axial images were obtained from the base of the skull through the vertex without intravenous contrast. COMPARISON:  CT head 06/17/2016, MRI head 07/2016 FINDINGS: Brain: New areas of wedge-shaped hypodensity  in the parietal lobe bilaterally. This involves cortex and was not present previously. This is most consistent with subacute infarction. No associated hemorrhage. Small hypodensity right cerebellum most consistent with chronic infarction as noted on prior MRI. No acute hemorrhage. Ventricle size normal. No shift of the midline structures. No other areas of acute infarct. Vascular: No hyperdense vessel or unexpected calcification. Skull: Negative Sinuses/Orbits: Negative Other: None IMPRESSION: New areas of wedge-shaped hypodensity in the parietal lobe bilaterally involving cortex most consistent with subacute infarction. In this patient with lung cancer, this is most consistent with infarction rather than metastatic disease, as these findings were not present 1 week ago. Patienthad multiple small areas of embolic infarction on the prior MRI. Current parietal infarcts are also consistent with emboli. No associated hemorrhage. Electronically Signed  By: Franchot Gallo M.D.   On: 06/24/2016 07:11   Ct Angio Neck W Or Wo Contrast  Result Date: 06/17/2016 CLINICAL DATA:  Stroke on MRI earlier today. EXAM: CT ANGIOGRAPHY HEAD AND NECK TECHNIQUE: Multidetector CT imaging of the head and neck was performed using the standard protocol during bolus administration of intravenous contrast. Multiplanar CT image reconstructions and MIPs were obtained to evaluate the vascular anatomy. Carotid stenosis measurements (when applicable) are obtained utilizing NASCET criteria, using the distal internal carotid diameter as the denominator. CONTRAST:  Study 5 cc Isovue 370 intravenous COMPARISON:  Brain MRI from earlier today FINDINGS: CT HEAD FINDINGS Brain: Known acute infarcts are not clearly visible. No hemorrhage, hydrocephalus, or shift. Vascular: No hyperdense vessel or unexpected calcification. Skull: 7 mm lucency in the lateral right orbital rim, which was described on brain MRI from earlier today. CT features favor a  benign process. Sinuses: Right mastoiditis. Orbits: No acute finding Review of the MIP images confirms the above findings CTA NECK FINDINGS Aortic arch: Normal size and appearance.  Three vessel branching. Right carotid system: Mild atherosclerotic plaque at the carotid bifurcation without stenosis or dissection. No ulceration. Left carotid system: Mild atherosclerotic calcification at the bifurcation. No stenosis, dissection, or ulceration. Vertebral arteries: No proximal subclavian artery stenosis. Codominant vertebral arteries are smooth and widely patent to the dura. Skeleton: No acute or aggressive finding in the neck. Other neck: Known adenopathy at the thoracic inlet and left supraclavicular fossa. Multi nodular goiter. Upper chest: Known left upper lobe malignancy with volume loss. No pulmonary emboli, partly seen on the left today. Review of the MIP images confirms the above findings CTA HEAD FINDINGS Anterior circulation: Symmetric carotid arteries. Symmetric branching. No major vessel occlusion, stenosis, or aneurysm. Posterior circulation:  Standard anatomy Venous sinuses: As permitted by contrast timing, patent. Anatomic variants: None significant Delayed phase: Posterior right frontal enhancement seen on brain MRI from earlier today may be subtly visible. Review of the MIP images confirms the above findings IMPRESSION: 1. Known multiple acute infarcts by MRI. No major vessel occlusion, flow limiting stenosis, or visible arterial embolic source. In this patient with recent pulmonary embolism, recommend workup for PFO. 2. Mild atherosclerosis. 3. 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 Electronically Signed   By: Monte Fantasia M.D.   On: 06/17/2016 20:23   Ct Angio Chest W/cm &/or Wo Cm  Result Date: 06/03/2016 CLINICAL DATA:  Severe left chest pain radiating down arm since this morning. Shortness of breath and fatigue since lithotripsy approximately 1  month ago. EXAM: CT ANGIOGRAPHY CHEST WITH CONTRAST TECHNIQUE: Multidetector CT imaging of the chest was performed using the standard protocol during bolus administration of intravenous contrast. Multiplanar CT image reconstructions and MIPs were obtained to evaluate the vascular anatomy. CONTRAST:  100 cc Isovue 370 COMPARISON:  None. FINDINGS: Cardiovascular: Multiple pulmonary emboli within the central segmental pulmonary arteries to the bilateral lower lobes, right greater than left. Several additional small pulmonary emboli within the central segmental pulmonary artery branches to the right upper lobe. No evidence of associated right heart failure. Heart size is normal. No pericardial effusion. Thoracic aorta is normal in caliber. No aortic dissection. Mediastinum/Nodes: Numerous small and enlarged lymph nodes within the mediastinum, largest lymph nodes within the anterior mediastinum. Representative lymph node within the anterior mediastinum measures 1.9 cm short axis dimension (series 5, image 33). Additional conglomerate lymphadenopathy noted within the precarinal space measuring approximately 3.6 x 2.1 cm. Conglomerate subcarinal lymphadenopathy measures  approximately 3.2 x 2.4 cm. Additional suspicious lymph nodes are seen in the right upper paratracheal region (series 5, images 14 through 23). Several mildly prominent supraclavicular lymph nodes are seen bilaterally, largest on the right measuring 1.1 cm short axis dimension (series 5, image 10). Esophagus and trachea are unremarkable. Lungs/Pleura: Dense consolidation is seen within the anterior segment of the left upper lobe, favored to represent airspace collapse secondary to a central obstructing mass. The presumed central obstructing mass is not clearly demarcated. The bronchus to the anterior segment of the left upper lobe terminates at the proximal margin of the consolidation. Lungs otherwise clear.  No pleural effusion. Upper Abdomen: Hypodense  masses are seen within the bilateral liver lobes, largest of which is in the left liver lobe demonstrating CT density measurements compatible with benign cysts (2 abutting cysts with largest component measuring 3 cm greatest dimension. A smaller lesion in the right liver lobe measures 1.6 x 1.6 cm, too small to definitively characterize. Limited images of the upper abdomen are otherwise unremarkable. Musculoskeletal: No acute or suspicious osseous finding. Superficial soft tissues are unremarkable. Review of the MIP images confirms the above findings. IMPRESSION: 1. Bilateral small pulmonary emboli within the central segmental pulmonary arteries to the right lower lobe, right upper lobe and left lower lobe. No evidence of associated right heart failure. 2. Large dense consolidation within the anterior segment of the left upper lobe, almost certainly postobstructive airspace collapse related to a central obstructing mass. The presumed central obstructing mass, however, is not clearly demarcated but is likely at the level of the acutely occluded left upper lobe bronchus. There is additional obstruction of the adjacent left upper lobe pulmonary vein. 3. Fairly extensive mediastinal lymphadenopathy, with measurements given above. Additional mildly prominent lymph nodes within the supraclavicular regions are also suspicious for metastatic lymphadenopathy. 4. Hypodense masses within each liver lobe, largest located within the left liver lobe has the appearance of 2 abutting simple cysts based on CT density measurements. The lesion within the right liver lobe is too small to definitively characterize and requires further characterization with MRI to exclude metastasis. Critical Value/emergent results were called by telephone at the time of interpretation on 06/03/2016 at 3:30 pm to Dr. Dossie Der [facility] , who verbally acknowledged these results. Electronically Signed   By: Franki Cabot M.D.   On: 06/03/2016 15:35   Mr Jeri Cos PJ  Contrast  Result Date: 06/17/2016 CLINICAL DATA:  Lung cancer staging. EXAM: MRI HEAD WITHOUT AND WITH CONTRAST TECHNIQUE: Multiplanar, multiecho pulse sequences of the brain and surrounding structures were obtained without and with intravenous contrast. CONTRAST:  25m MULTIHANCE GADOBENATE DIMEGLUMINE 529 MG/ML IV SOLN COMPARISON:  None. FINDINGS: Brain: Numerous sub cm foci of restricted diffusion throughout the bilateral cerebellum and along the bilateral cerebral convexities, anterior and posterior circulation. No hemorrhagic changes within these areas. There are 2 areas of sub cm cortical enhancement in the right frontal parietal cortex which on sagittal acquisition have a flat morphology. Favor subacute enhancing infarcts over metastases. Will need short-term follow-up scan. No hydrocephalus. Vascular: Normal flow voids. Skull and upper cervical spine: 9 mm area of T2 hyperintense and T1 hypo intense, enhancing signal abnormality in the lateral rim right orbit. Sinuses/Orbits: Right mastoid effusion with negative nasopharynx. Other: These results were called by telephone at the time of interpretation on 06/17/2016 at 2:42 pm to Dr. GCharlaine Dalton, who verbally acknowledged these results. IMPRESSION: 1. Numerous small acute infarcts in multiple vascular distributions. In this patient  with recent pulmonary embolism a PFO workup is recommended. 2. 2 subcentimeter foci of cortically based enhancement in the right cerebral hemisphere, favor subacute enhancing infarcts. Recommend follow-up scan in 2-3 months to exclude metastasis from patient's lung cancer. 3. 9 mm bone lesion in the right lateral orbital rim, metastasis versus developmental inclusion cyst (which occur in this location). Attention on follow-up. 4. Right mastoid effusion with negative nasopharynx. Electronically Signed   By: Monte Fantasia M.D.   On: 06/17/2016 14:44   Ct Abdomen Pelvis W Contrast  Result Date: 06/04/2016 CLINICAL DATA:   A recent chest CT demonstrated pulmonary emboli eye and concern for malignancy. Possible liver lesion on chest CT. EXAM: CT ABDOMEN AND PELVIS WITH CONTRAST TECHNIQUE: Multidetector CT imaging of the abdomen and pelvis was performed using the standard protocol following bolus administration of intravenous contrast. CONTRAST:  171m ISOVUE-300 IOPAMIDOL (ISOVUE-300) INJECTION 61% COMPARISON:  Chest CTA 06/03/2016 FINDINGS: Lower chest: Lung bases are clear without pleural fluid. A few of the known pulmonary emboli are visualized at the lung bases. Hepatobiliary: There are low-density structures scattered throughout the liver. The largest structures are water attenuation and compatible with cysts. The largest is a bilobed or slightly complex cyst near the left hepatic dome measuring up to 4.4 cm. Some of the smaller structures are too small to definitively characterize. Portal venous system is patent. Normal appearance of the gallbladder. No biliary dilatation. Pancreas: Focal fat near the junction of pancreatic head and uncinate process. Otherwise, normal appearance of the pancreas. Spleen: Peripheral low-density structure along the superior spleen measures roughly 2.2 cm. This could be related to a splenic infarct. Remainder of the spleen has a normal appearance. Adrenals/Urinary Tract: Indeterminate 1.2 cm nodule in the left adrenal gland. Questionable nodule along the right adrenal gland medial limb. 1.5 cm cortical cyst in left kidney upper pole. No hydronephrosis. No suspicious renal lesions. Bladder is distended and the top the bladder extends up to the umbilicus. Stomach/Bowel: The stomach is unremarkable. Colonic diverticulosis without acute colonic inflammation. Normal appendix. Mild wall thickening of the distal stomach is probably within normal limits. Vascular/Lymphatic: Aortic atherosclerosis without aneurysm. Incidentally, there is a circumaortic left renal vein. Enlarged lymph nodes in the lower chest  around the descending thoracic aorta and esophagus. Index lymph node on sequence 2, image 12 measures 1.4 cm. Enlarged lymph node in the left external iliac chain on sequence 2, image 62 measures 2.4 x 1.9 cm. Reproductive: Prostate is massively enlarged measuring 7.5 x 7.6 x 7.6 cm. In addition, the prostate is very nodular with large nodular components extending into the bladder base and a large nodular component along the right posterior aspect of the prostate. Other: No free fluid.  Small umbilical hernia containing fat. Musculoskeletal: Heterogeneity in the right iliac bone near the right SI joint is nonspecific. Disc space narrowing with endplate disease at LB1-Y7 IMPRESSION: Enlarged lymph nodes in the lower chest are compatible with metastatic disease seen on the prior chest CT. In addition, there is an indeterminate 1.2 cm left adrenal nodule which could represent metastatic disease. Question a small right adrenal lesion. Enlarged lymph node in the the left pelvis could also represent a metastatic lesion. Recommend further characterization of the lymphadenopathy and presumed metastatic disease with a PET-CT. Numerous hypodense structures throughout the liver. The largest of these lesions are compatible with cysts. Massive enlargement of the prostate with diffuse nodularity. This could represent benign prostatic hypertrophy and recommend correlation with PSA level. Urinary bladder is distended  and suspect there is a component of bladder outlet obstruction associated with this prostate hypertrophy. Wedge-shaped defect in the spleen. This could represent a small splenic infarct but indeterminate. Known pulmonary emboli. Electronically Signed   By: Markus Daft M.D.   On: 06/04/2016 16:02   US Biopsy  Result Date: 06/05/2016 CLINICAL DATA:  Anterior left lung mass. Mediastinal and bilateral supraclavicular adenopathy. Liver lesions. EXAM: ULTRASOUND GUIDED CORE BIOPSY OF LEFT SUPRACLAVICULAR ADENOPATHY  MEDICATIONS: Lidocaine 1% subcutaneous PROCEDURE: The procedure, risks, benefits, and alternatives were explained to the patient. Questions regarding the procedure were encouraged and answered. The patient understands and consents to the procedure. Survey ultrasound of the left supraclavicular region was performed. The pathologic adenopathy was localized and an appropriate skin entry site determined and marked. The operative field was prepped with chlorhexidine in a sterile fashion, and a sterile drape was applied covering the operative field. A sterile gown and sterile gloves were used for the procedure. Local anesthesia was provided with 1% Lidocaine. Under real-time ultrasound guidance, a 17 gauge trocar needle was advanced to the margin of the lesion. Once needle tip position was confirmed, coaxial 18-gauge core biopsy samples were obtained, submitted in saline to surgical pathology. The guide needle was removed. The patient tolerated the procedure well. COMPLICATIONS: None. FINDINGS: Left supraclavicular adenopathy was confirmed. Core biopsy samples obtained under ultrasound guidance as above. IMPRESSION: 1. Technically successful ultrasound-guided core biopsy of left supraclavicular pathologic adenopathy. Electronically Signed   By: Lucrezia Europe M.D.   On: 06/05/2016 16:08   US Venous Img Lower Unilateral Left  Result Date: 06/03/2016 CLINICAL DATA:  Subacute onset of left lower extremity pain. Known pulmonary embolus. Initial encounter. EXAM: LEFT LOWER EXTREMITY VENOUS DOPPLER ULTRASOUND TECHNIQUE: Gray-scale sonography with graded compression, as well as color Doppler and duplex ultrasound were performed to evaluate the lower extremity deep venous systems from the level of the common femoral vein and including the common femoral, femoral, profunda femoral, popliteal and calf veins including the posterior tibial, peroneal and gastrocnemius veins when visible. The superficial great saphenous vein was also  interrogated. Spectral Doppler was utilized to evaluate flow at rest and with distal augmentation maneuvers in the common femoral, femoral and popliteal veins. COMPARISON:  None. FINDINGS: Contralateral Common Femoral Vein: Respiratory phasicity is normal and symmetric with the symptomatic side. No evidence of thrombus. Normal compressibility. Common Femoral Vein: No evidence of thrombus. Normal compressibility, respiratory phasicity and response to augmentation. Saphenofemoral Junction: No evidence of thrombus. Normal compressibility and flow on color Doppler imaging. Profunda Femoral Vein: No evidence of thrombus. Normal compressibility and flow on color Doppler imaging. Femoral Vein: No evidence of thrombus. Normal compressibility, respiratory phasicity and response to augmentation. Popliteal Vein: No evidence of thrombus. Normal compressibility, respiratory phasicity and response to augmentation. Calf Veins: Occlusive thrombus is noted within one of the patient's left posterior tibial veins. The remaining visualized calf veins are unremarkable. Superficial Great Saphenous Vein: No evidence of thrombus. Normal compressibility and flow on color Doppler imaging. Venous Reflux:  None. Other Findings:  None. IMPRESSION: Occlusive deep venous thrombus noted within one of the patient's posterior tibial veins. These results were called by telephone at the time of interpretation on 06/03/2016 at 6:16 pm to Dr. Harvest Dark, who verbally acknowledged these results. Electronically Signed   By: Garald Balding M.D.   On: 06/03/2016 18:19    EKG: Interpreted by me showed: NSR, 85 bpm, TWI III Telemetry: Interpreted by me showed: NSR, 80's bpm  Weights: [employer]  Weights   06/24/16 0529 06/24/16 0926  Weight: 142 lb (64.4 kg) 140 lb 11.2 oz (63.8 kg)     Physical Exam: Blood pressure 131/67, pulse 81, temperature 97.6 F (36.4 C), temperature source Oral, resp. rate 18, height '5\' 4"'$  (3.007 m), weight 140 lb 11.2  oz (63.8 kg), SpO2 99 %. Body mass index is 24.15 kg/m. General: Well developed, well nourished, in no acute distress. Head: Normocephalic, atraumatic, sclera non-icteric, no xanthomas, nares are without discharge.  Neck: Negative for carotid bruits. JVD not elevated. Lungs: Clear bilaterally to auscultation without wheezes, rales, or rhonchi. Breathing is unlabored. Heart: RRR with S1 S2. No murmurs, rubs, or gallops appreciated. Abdomen: Soft, non-tender, non-distended with normoactive bowel sounds. No hepatomegaly. No rebound/guarding. No obvious abdominal masses. Msk:  Strength and tone appear normal for age. Extremities: No clubbing or cyanosis. No edema. Distal pedal pulses are 2+ and equal bilaterally. Neuro: Alert and oriented X 3. No facial asymmetry. No focal deficit. Moves all extremities spontaneously. Psych:  Responds to questions appropriately with a normal affect.    Assessment and Plan:  Principal Problem:   Acute cerebral infarction [facility]) Active Problems:   DVT (deep venous thrombosis) (HCC)   PE (pulmonary thromboembolism) ([place])   Metastatic lung cancer (metastasis from lung to other site) (HCC)   Elevated troponin    1. Elevated troponin: -Likely in the setting of acute CVA -No symptoms concerning for chest pain -Recent echo as above, consider repeating limited study to evaluate LVSF and wall motion -Doubt ACS -No plans for inpatient ischemic work up  2. Recurrent stroke: -On Xarelto for hypercoagulable state and reports compliance  -Neuro recommends continuing Xarelto, oncology recommends Lovenox over Xarelto -Defer to them regarding further management of stroke -Neurology added ASA -Lipid and A1C pending -TTE on 9/29 without mention of PFO, consider TEE with bubble study to formally evaluate for PFO, though doubt this would be a great yield given his metastatic lung cancer  3. Metastatic lung cancer: -Per primary  4. DVT/PE: -As  above   Signed, Christell Faith, PA-C [place] Pager: [phone] 06/24/2016, 4:14 PM

## 2016-06-24 NOTE — Progress Notes (Signed)
ANTICOAGULATION CONSULT NOTE - Initial Consult  Pharmacy Consult for Lovenox Indication: pulmonary embolus  No Known Allergies  Patient Measurements: Height: '5\' 4"'$  (162.6 cm) Weight: 140 lb 11.2 oz (63.8 kg) IBW/kg (Calculated) : 59.2 Heparin Dosing Weight: 63.8 ml/min  Vital Signs: Temp: 97.6 F (36.4 C) (10/18 1402) Temp Source: Oral (10/18 1402) BP: 131/67 (10/18 1402) Pulse Rate: 81 (10/18 1402)  Labs:  Recent Labs  06/24/16 0632 06/24/16 1038  HGB 12.4*  --   HCT 35.9*  --   PLT 293  --   APTT 31  --   LABPROT 26.9*  --   INR 2.43  --   CREATININE 1.07  --   TROPONINI 0.73* 1.40*    Estimated Creatinine Clearance: 59.9 mL/min (by C-G formula based on SCr of 1.07 mg/dL).   Medical History: Past Medical History:  Diagnosis Date  . Abnormal prostate specific antigen 08/08/2012  . Adiposity 04/16/2015  . CVA (cerebral vascular accident) ([place]) 06/17/2016  . Diabetes mellitus without complication ([place])   . Diverticulosis of sigmoid colon 04/16/2015  . Dyslipidemia 03/18/2015  . Hemorrhoids, internal 04/16/2015  . Hypercholesteremia 04/16/2015  . Hyperlipidemia   . Hypertension   . Primary cancer of left upper lobe of lung ([place])   . Pulmonary embolism ([place])   . Wears dentures    partial upper    Medications:  Prescriptions Prior to Admission  Medication Sig Dispense Refill Last Dose  . atorvastatin (LIPITOR) 40 MG tablet Take 40 mg by mouth daily.   06/23/2016 at 0800  . finasteride (PROSCAR) 5 MG tablet Take 5 mg by mouth at bedtime.    06/23/2016 at 0730  . glipiZIDE (GLUCOTROL) 5 MG tablet Take 1 tablet (5 mg total) by mouth 2 (two) times daily before a meal. 180 tablet 0 06/23/2016 at 0730  . lisinopril (PRINIVIL,ZESTRIL) 10 MG tablet TAKE ONE TABLET BY MOUTH ONCE DAILY 90 tablet 0 06/23/2016 at 0730  . metFORMIN (GLUCOPHAGE) 1000 MG tablet Take 0.5 tablets (500 mg total) by mouth 2 (two) times daily. 90 tablet 0 06/23/2016 at 1860  . tamsulosin (FLOMAX) 0.4  MG CAPS capsule Take 0.4 mg by mouth every morning.    06/23/2016 at 0730  . XARELTO 15 MG TABS tablet Take 15 mg by mouth 2 (two) times daily.   06/23/2016 at 1830  . crizotinib (XALKORI) 250 MG capsule Take 1 capsule (250 mg total) by mouth 2 (two) times daily. (Patient not taking: Reported on 06/24/2016) 60 capsule 4 Not Taking    Assessment: CrCl = 59.9 ml/min  Pt received dose of xarelto 15 mg PO on 10/18 @ 12:00  Goal of Therapy:  resolution of PE Monitor platelets by anticoagulation protocol: Yes   Plan:  Lovenox 65 mg SQ Q12H ordered to start on 10/18 @ 23:00.   Lehman Whiteley D 06/24/2016,4:41 PM

## 2016-06-24 NOTE — Progress Notes (Signed)
Dr. Lavetta Nielsen made aware of elevated troponin of 1.40. No new orders. Madlyn Frankel, RN

## 2016-06-24 NOTE — Telephone Encounter (Signed)
Co pay relief card faxed to [employer] this morning with BIN [id number] Group [id number] ID: [id number]  Attempting to get patient started asap on Xalkori.  Dr. Rogue Bussing updated on progress for initiating the oral chemotherapy. Dr. Rogue Bussing made RN aware that patient is currently in ED -currently being worked up for a code stroke. He asked me to reach out to wife to instruct her to contact [employer] asap to arrange for drug shipment.  Melvin Willis, pt's wife, was provided with the co pay relief card information. I instructed her that this information will also be mailed to her by [employer]. This copay relief card is good until 09/06/2018. The patient will have a zero copay. I explained that without this copay relief card his out of pocket expense would be a $90/30 days supply. She was provided with [employer]'s direct phone number to call to arrange for shipment. She understands that the critical nature of getting her husband started on this drug as the mass in his neck is most likely contributing to the CVA symptoms per Dr. Rogue Bussing.  I gave her our number in the cancer center to call me back if she has any other concerns or questions regarding the Xalkori or process with [employer].  She thanked me for continuing advocating for her husband.

## 2016-06-24 NOTE — Telephone Encounter (Signed)
RN spoke with Opal Sidles at [employer].  Per [employer],  [employer] only prior authorized #30. Not #60. She believes "this is why the system is triggering the error that cost of drug exceeds over the allotted amount - approved by insurance."  Spoke with [place] new case had to be opened up for prior auth. Insurance only authorized 15 days supply. I explained that Md wanted the patient to have a 30 days supply.  The information was recorded in the prior auth system. I expressed the urgency and medical necessity of expediting this request and gave the RN my personal ascom number to call me back asap to resolve this issue.

## 2016-06-24 NOTE — Progress Notes (Signed)
Received a consult that a pt in room 113 requested prayer for his condition. Arrived at pt room and met with him for about 25 minutes. Provided the ministry of prayer and a pastoral presence.     06/24/16 1050  Clinical Encounter Type  Visited With Patient and family together  Visit Type Initial;Spiritual support  Referral From Nurse  Consult/Referral To Chaplain  Spiritual Encounters  Spiritual Needs Prayer;Emotional

## 2016-06-24 NOTE — H&P (Signed)
[facility] NAME: Melvin Willis    MR#:  [id number]  DATE OF BIRTH:  1950/11/21   DATE OF ADMISSION:  06/24/2016  PRIMARY CARE PHYSICIAN: Keith Rake, MD   REQUESTING/REFERRING PHYSICIAN: Dahlia Client  CHIEF COMPLAINT:   Chief Complaint  Patient presents with  . DVT  . Numbness    HISTORY OF PRESENT ILLNESS:  Melvin Willis  is a 65 y.o. male with a known history of Metastatic lung cancer as well as a DVT already on Xarelto treatment, incidental embolic CVA noted on MRI who is just discharged from the hospital one week ago presenting with left-sided hand weakness and slurred speech. Onset of symptoms 4 AM morning of admission, no symptoms completely resolved. No TPA given because of severity of symptoms as well as already on Xarelto. He was discharged by myself last admission having incidental findings on MRI for embolic CVA, these findings were consistent with being diagnosed with blood clot and starting on Xarelto a few weeks before hand. Now, however, given new symptoms we will admit   PAST MEDICAL HISTORY:   Past Medical History:  Diagnosis Date  . Abnormal prostate specific antigen 08/08/2012  . Adiposity 04/16/2015  . CVA (cerebral vascular accident) ([place]) 06/17/2016  . Diabetes mellitus without complication ([place])   . Diverticulosis of sigmoid colon 04/16/2015  . Dyslipidemia 03/18/2015  . Hemorrhoids, internal 04/16/2015  . Hypercholesteremia 04/16/2015  . Hyperlipidemia   . Hypertension   . Primary cancer of left upper lobe of lung ([place])   . Pulmonary embolism ([place])   . Wears dentures    partial upper    PAST SURGICAL HISTORY:   Past Surgical History:  Procedure Laterality Date  . COLONOSCOPY    . COLONOSCOPY WITH PROPOFOL N/A 05/06/2015   Procedure: COLONOSCOPY WITH PROPOFOL;  Surgeon: Lucilla Lame, MD;  Location: [facility];  Service: Endoscopy;  Laterality: N/A;  ASCENDING COLON POLYPS X 2 TERMINAL  ILEUM BIOPSY RANDOM COLON BX. TRANSVERSE COLON POLYP SIGMOID COLON POLYP  . ESOPHAGOGASTRODUODENOSCOPY (EGD) WITH PROPOFOL N/A 05/06/2015   Procedure: ESOPHAGOGASTRODUODENOSCOPY (EGD) WITH PROPOFOL;  Surgeon: Lucilla Lame, MD;  Location: [facility];  Service: Endoscopy;  Laterality: N/A;  GASTRIC BIOPSY X1    SOCIAL HISTORY:   Social History  Substance Use Topics  . Smoking status: Never Smoker  . Smokeless tobacco: Never Used  . Alcohol use No    FAMILY HISTORY:   Family History  Problem Relation Age of Onset  . Diabetes Mother   . Diabetes Father   . CAD Father   . Dementia Father   . Diabetes Sister   . Cancer Maternal Uncle     Prostate  . Cancer Cousin     prostate    DRUG ALLERGIES:  No Known Allergies  REVIEW OF SYSTEMS:  REVIEW OF SYSTEMS:  CONSTITUTIONAL: Denies fevers, chills, fatigue, weakness.  EYES: Denies blurred vision, double vision, or eye pain.  EARS, NOSE, THROAT: Denies tinnitus, ear pain, hearing loss.  RESPIRATORY: denies cough, shortness of breath, wheezing  CARDIOVASCULAR: Denies chest pain, palpitations, edema.  GASTROINTESTINAL: Denies nausea, vomiting, diarrhea, abdominal pain.  GENITOURINARY: Denies dysuria, hematuria.  ENDOCRINE: Denies nocturia or thyroid problems. HEMATOLOGIC AND LYMPHATIC: Denies easy bruising or bleeding.  SKIN: Denies rash or lesions.  MUSCULOSKELETAL: Denies pain in neck, back, shoulder, knees, hips, or further arthritic symptoms.  NEUROLOGIC: Denies paralysis, paresthesias.  PSYCHIATRIC: Denies anxiety or depressive symptoms. Otherwise full review of systems performed  by me is negative.   MEDICATIONS AT HOME:   Prior to Admission medications   Medication Sig Start Date End Date Taking? Authorizing Provider  atorvastatin (LIPITOR) 40 MG tablet Take 40 mg by mouth daily.   Yes Historical Provider, MD  finasteride (PROSCAR) 5 MG tablet Take 5 mg by mouth at bedtime.  03/04/15  Yes Historical Provider, MD   glipiZIDE (GLUCOTROL) 5 MG tablet Take 1 tablet (5 mg total) by mouth 2 (two) times daily before a meal. 04/03/16  Yes Roselee Nova, MD  lisinopril (PRINIVIL,ZESTRIL) 10 MG tablet TAKE ONE TABLET BY MOUTH ONCE DAILY 06/15/16  Yes Roselee Nova, MD  metFORMIN (GLUCOPHAGE) 1000 MG tablet Take 0.5 tablets (500 mg total) by mouth 2 (two) times daily. 04/03/16  Yes Roselee Nova, MD  tamsulosin (FLOMAX) 0.4 MG CAPS capsule Take 0.4 mg by mouth every morning.  01/24/15  Yes Historical Provider, MD  XARELTO 15 MG TABS tablet Take 15 mg by mouth 2 (two) times daily. 06/06/16  Yes Historical Provider, MD  crizotinib Hulda Humphrey) 250 MG capsule Take 1 capsule (250 mg total) by mouth 2 (two) times daily. Patient not taking: Reported on 06/24/2016 06/19/16   Cammie Sickle, MD      VITAL SIGNS:  Blood pressure 126/82, pulse 88, temperature 98 F (36.7 C), temperature source Oral, resp. rate 17, height '5\' 4"'$  (1.626 m), weight 64.4 kg (142 lb), SpO2 99 %.  PHYSICAL EXAMINATION:  VITAL SIGNS: Vitals:   06/24/16 0528 06/24/16 0730  BP: (!) 144/73 126/82  Pulse: 95 88  Resp: 20 17  Temp: 98 F (36.7 C)    GENERAL:64 y.o.male currently in no acute distress.  HEAD: Normocephalic, atraumatic.  EYES: Pupils equal, round, reactive to light. Extraocular muscles intact. No scleral icterus.  MOUTH: Moist mucosal membrane. Dentition intact. No abscess noted.  EAR, NOSE, THROAT: Clear without exudates. No external lesions.  NECK: Supple. No thyromegaly. No nodules. No JVD.  PULMONARY: Clear to ascultation, without wheeze rails or rhonci. No use of accessory muscles, Good respiratory effort. good air entry bilaterally CHEST: Nontender to palpation.  CARDIOVASCULAR: S1 and S2. Regular rate and rhythm. No murmurs, rubs, or gallops. No edema. Pedal pulses 2+ bilaterally.  GASTROINTESTINAL: Soft, nontender, nondistended. No masses. Positive bowel sounds. No hepatosplenomegaly.  MUSCULOSKELETAL: No  swelling, clubbing, or edema. Range of motion full in all extremities.  NEUROLOGIC: Cranial nerves II through XII are intact. No gross focal neurological deficits. Sensation intact. Reflexes intact.  SKIN: No ulceration, lesions, rashes, or cyanosis. Skin warm and dry. Turgor intact.  PSYCHIATRIC: Mood, affect within normal limits. The patient is awake, alert and oriented x 3. Insight, judgment intact.    LABORATORY PANEL:   CBC  Recent Labs Lab 06/24/16 0632  WBC 8.4  HGB 12.4*  HCT 35.9*  PLT 293   ------------------------------------------------------------------------------------------------------------------  Chemistries   Recent Labs Lab 06/24/16 0632  NA 134*  K 4.6  CL 101  CO2 24  GLUCOSE 151*  BUN 16  CREATININE 1.07  CALCIUM 9.7  AST 25  ALT 41  ALKPHOS 77  BILITOT 0.8   ------------------------------------------------------------------------------------------------------------------  Cardiac Enzymes  Recent Labs Lab 06/24/16 0632  TROPONINI 0.73*   ------------------------------------------------------------------------------------------------------------------  RADIOLOGY:  Ct Head Wo Contrast  Result Date: 06/24/2016 CLINICAL DATA:  Slurred speech.  Left arm numbness.  Lung cancer. EXAM: CT HEAD WITHOUT CONTRAST TECHNIQUE: Contiguous axial images were obtained from the base of the skull through the vertex without  intravenous contrast. COMPARISON:  CT head 06/17/2016, MRI head 07/2016 FINDINGS: Brain: New areas of wedge-shaped hypodensity in the parietal lobe bilaterally. This involves cortex and was not present previously. This is most consistent with subacute infarction. No associated hemorrhage. Small hypodensity right cerebellum most consistent with chronic infarction as noted on prior MRI. No acute hemorrhage. Ventricle size normal. No shift of the midline structures. No other areas of acute infarct. Vascular: No hyperdense vessel or unexpected  calcification. Skull: Negative Sinuses/Orbits: Negative Other: None IMPRESSION: New areas of wedge-shaped hypodensity in the parietal lobe bilaterally involving cortex most consistent with subacute infarction. In this patient with lung cancer, this is most consistent with infarction rather than metastatic disease, as these findings were not present 1 week ago. Patienthad multiple small areas of embolic infarction on the prior MRI. Current parietal infarcts are also consistent with emboli. No associated hemorrhage. Electronically Signed   By: Franchot Gallo M.D.   On: 06/24/2016 07:11    EKG:   Orders placed or performed during the hospital encounter of 06/24/16  . ED EKG  . ED EKG    IMPRESSION AND PLAN:   65 year old African-American gentleman history of metastatic lung cancer, DVT, CVA currently on Xarelto presented with left-sided weakness  1. Subacute CVA: Failure of Xarelto?, New findings since last week, already had workup including carotid, lipid, echocardiogram. Started on Lipitor for LDL greater than 70, patient is on Xarelto for hypercoagulable state but given new findings of CVA will ask neurology/oncology input as far as best medication management. I question if he would benefit changing to Lovenox given adenocarcinoma of the lung and its proven track record in metastatic disease and hypercoagulable state, but once again I will defer to oncology/neurology 2. Hyperlipidemia unspecified: Statin therapy 3. Pulmonary embolism continue Xarelto 4. Type 2 diabetes non-insulin-requiring sliding scale coverage 5. Essential hypertension: Lisinopril    All the records are reviewed and case discussed with ED provider. Management plans discussed with the patient, family and they are in agreement.  CODE STATUS: Full  TOTAL TIME TAKING CARE OF THIS PATIENT: 33 minutes.    Rook Maue,  Karenann Cai.D on 06/24/2016 at 8:59 AM  Between 7am to 6pm - Pager - [phone]  After 6pm: House Pager: -  [phone]  [facility]  Office  [phone]  CC: Primary care physician; Keith Rake, MD

## 2016-06-24 NOTE — Consult Note (Signed)
Referring Physician: Hower    Chief Complaint: Left sided weakness, difficulty with speech  HPI: Melvin Willis is an 65 y.o. male who reports going to bed at baseline.  Awakened at about 0130 and had difficulty with speech, left facial droop and left arm/face numbness.  Symptoms lasted for about 5 minutes and resolved.  Patient on Xarelto due to PE and DVT from a proposed hypercoagulable state due to lung cancer.  Was hospitalized about a week ago due to embolic infarcts at that time.  Work up was unremarkable.  Initial NIHSS of 0.      Date last known well: Date: 06/24/2016 Time last known well: Time: 00:00 tPA Given: No: Resolution of symptoms, recent infarct, on Xarelto  Past Medical History:  Diagnosis Date  . Abnormal prostate specific antigen 08/08/2012  . Adiposity 04/16/2015  . CVA (cerebral vascular accident) ([place]) 06/17/2016  . Diabetes mellitus without complication ([place])   . Diverticulosis of sigmoid colon 04/16/2015  . Dyslipidemia 03/18/2015  . Hemorrhoids, internal 04/16/2015  . Hypercholesteremia 04/16/2015  . Hyperlipidemia   . Hypertension   . Primary cancer of left upper lobe of lung ([place])   . Pulmonary embolism ([place])   . Wears dentures    partial upper    Past Surgical History:  Procedure Laterality Date  . COLONOSCOPY    . COLONOSCOPY WITH PROPOFOL N/A 05/06/2015   Procedure: COLONOSCOPY WITH PROPOFOL;  Surgeon: Lucilla Lame, MD;  Location: [name];  Service: Endoscopy;  Laterality: N/A;  ASCENDING COLON POLYPS X 2 TERMINAL ILEUM BIOPSY RANDOM COLON BX. TRANSVERSE COLON POLYP SIGMOID COLON POLYP  . ESOPHAGOGASTRODUODENOSCOPY (EGD) WITH PROPOFOL N/A 05/06/2015   Procedure: ESOPHAGOGASTRODUODENOSCOPY (EGD) WITH PROPOFOL;  Surgeon: Lucilla Lame, MD;  Location: [facility];  Service: Endoscopy;  Laterality: N/A;  GASTRIC BIOPSY X1    Family History  Problem Relation Age of Onset  . Diabetes Mother   . Diabetes Father   . CAD Father   .  Dementia Father   . Diabetes Sister   . Cancer Maternal Uncle     Prostate  . Cancer Cousin     prostate   Social History:  reports that he has never smoked. He has never used smokeless tobacco. He reports that he does not drink alcohol or use drugs.  Allergies: No Known Allergies  Medications:  I have reviewed the patient's current medications. Prior to Admission:  Prescriptions Prior to Admission  Medication Sig Dispense Refill Last Dose  . atorvastatin (LIPITOR) 40 MG tablet Take 40 mg by mouth daily.   06/23/2016 at 0800  . finasteride (PROSCAR) 5 MG tablet Take 5 mg by mouth at bedtime.    06/23/2016 at 0730  . glipiZIDE (GLUCOTROL) 5 MG tablet Take 1 tablet (5 mg total) by mouth 2 (two) times daily before a meal. 180 tablet 0 06/23/2016 at 0730  . lisinopril (PRINIVIL,ZESTRIL) 10 MG tablet TAKE ONE TABLET BY MOUTH ONCE DAILY 90 tablet 0 06/23/2016 at 0730  . metFORMIN (GLUCOPHAGE) 1000 MG tablet Take 0.5 tablets (500 mg total) by mouth 2 (two) times daily. 90 tablet 0 06/23/2016 at 1860  . tamsulosin (FLOMAX) 0.4 MG CAPS capsule Take 0.4 mg by mouth every morning.    06/23/2016 at 0730  . XARELTO 15 MG TABS tablet Take 15 mg by mouth 2 (two) times daily.   06/23/2016 at 1830  . crizotinib (XALKORI) 250 MG capsule Take 1 capsule (250 mg total) by mouth 2 (two) times daily. (Patient not  taking: Reported on 06/24/2016) 60 capsule 4 Not Taking   Scheduled: . aspirin  300 mg Rectal Daily   Or  . aspirin  325 mg Oral Daily  . atorvastatin  40 mg Oral Daily  . finasteride  5 mg Oral QHS  . insulin aspart  0-5 Units Subcutaneous QHS  . insulin aspart  0-9 Units Subcutaneous TID WC  . lisinopril  10 mg Oral Daily  . Rivaroxaban  15 mg Oral BID  . sodium chloride flush  3 mL Intravenous Q12H  . tamsulosin  0.4 mg Oral q morning - 10a    ROS: History obtained from the patient  General ROS: negative for - chills, fatigue, fever, night sweats, weight gain or weight  loss Psychological ROS: negative for - behavioral disorder, hallucinations, memory difficulties, mood swings or suicidal ideation Ophthalmic ROS: negative for - blurry vision, double vision, eye pain or loss of vision ENT ROS: negative for - epistaxis, nasal discharge, oral lesions, sore throat, tinnitus or vertigo Allergy and Immunology ROS: negative for - hives or itchy/watery eyes Hematological and Lymphatic ROS: negative for - bleeding problems, bruising or swollen lymph nodes Endocrine ROS: negative for - galactorrhea, hair pattern changes, polydipsia/polyuria or temperature intolerance Respiratory ROS: negative for - cough, hemoptysis, shortness of breath or wheezing Cardiovascular ROS: negative for - chest pain, dyspnea on exertion, edema or irregular heartbeat Gastrointestinal ROS: negative for - abdominal pain, diarrhea, hematemesis, nausea/vomiting or stool incontinence Genito-Urinary ROS: negative for - dysuria, hematuria, incontinence or urinary frequency/urgency Musculoskeletal ROS: negative for - joint swelling or muscular weakness Neurological ROS: as noted in HPI Dermatological ROS: negative for rash and skin lesion changes  Physical Examination: Blood pressure 121/82, pulse 85, temperature 98.2 F (36.8 C), temperature source Oral, resp. rate 18, height '5\' 4"'$  (1.626 m), weight 63.8 kg (140 lb 11.2 oz), SpO2 98 %.  HEENT-  Normocephalic, no lesions, without obvious abnormality.  Normal external eye and conjunctiva.  Normal TM's bilaterally.  Normal auditory canals and external ears. Normal external nose, mucus membranes and septum.  Normal pharynx. Cardiovascular- S1, S2 normal, pulses palpable throughout   Lungs- chest clear, no wheezing, rales, normal symmetric air entry Abdomen- soft, non-tender; bowel sounds normal; no masses,  no organomegaly Extremities- no edema Lymph-no adenopathy palpable Musculoskeletal-no joint tenderness, deformity or swelling Skin-warm and dry,  no hyperpigmentation, vitiligo, or suspicious lesions  Neurological Examination Mental Status: Alert, oriented, thought content appropriate.  Speech fluent without evidence of aphasia.  Able to follow 3 step commands without difficulty. Cranial Nerves: II: Discs flat bilaterally; Visual fields grossly normal, pupils equal, round, reactive to light and accommodation III,IV, VI: ptosis not present, extra-ocular motions intact bilaterally V,VII: smile symmetric, facial light touch sensation normal bilaterally VIII: hearing normal bilaterally IX,X: gag reflex present XI: bilateral shoulder shrug XII: midline tongue extension Motor: Right : Upper extremity   5/5    Left:     Upper extremity   5/5 with pronation  Lower extremity   5/5     Lower extremity   5/5 Tone and bulk:normal tone throughout; no atrophy noted Sensory: Pinprick and light touch intact throughout, bilaterally Deep Tendon Reflexes: 2+ and symmetric throughout Plantars: Right: downgoing   Left: downgoing Cerebellar: normal finger-to-nose and normal heel-to-shin testing bilaterally    Laboratory Studies:  Basic Metabolic Panel:  Recent Labs Lab 06/17/16 1731 06/19/16 1110 06/24/16 0632  NA 136 134* 134*  K 4.5 4.3 4.6  CL 103 99* 101  CO2 24 24  24  GLUCOSE 210* 241* 151*  BUN '17 14 16  '$ CREATININE 1.29* 1.22 1.07  CALCIUM 9.9 9.7 9.7    Liver Function Tests:  Recent Labs Lab 06/19/16 1110 06/24/16 0632  AST 25 25  ALT 37 41  ALKPHOS 82 77  BILITOT 0.5 0.8  PROT 8.0 7.5  ALBUMIN 4.0 3.8   No results for input(s): LIPASE, AMYLASE in the last 168 hours. No results for input(s): AMMONIA in the last 168 hours.  CBC:  Recent Labs Lab 06/17/16 1731 06/18/16 0652 06/19/16 1110 06/24/16 0632  WBC 8.2 7.8 7.3 8.4  NEUTROABS 6.1  --  5.5 6.7*  HGB 12.2* 12.1* 12.0* 12.4*  HCT 36.4* 34.6* 35.2* 35.9*  MCV 82.4 81.5 81.7 81.8  PLT 364 331 359 293    Cardiac Enzymes:  Recent Labs Lab  06/24/16 0632 06/24/16 1038  TROPONINI 0.73* 1.40*    BNP: Invalid input(s): POCBNP  CBG:  Recent Labs Lab 06/17/16 2113 06/18/16 0328 06/18/16 0724 06/24/16 1139  GLUCAP 134* 125* 130* 163*    Microbiology: No results found for this or any previous visit.  Coagulation Studies:  Recent Labs  06/24/16 0632  LABPROT 26.9*  INR 2.43    Urinalysis:  Recent Labs Lab 06/24/16 0632  COLORURINE YELLOW*  LABSPEC 1.008  PHURINE 6.0  GLUCOSEU 50*  HGBUR 1+*  BILIRUBINUR NEGATIVE  KETONESUR NEGATIVE  PROTEINUR NEGATIVE  NITRITE NEGATIVE  LEUKOCYTESUR TRACE*    Lipid Panel:    Component Value Date/Time   CHOL 165 04/03/2016 0933   CHOL 155 10/01/2015 1020   TRIG 72 04/03/2016 0933   HDL 50 04/03/2016 0933   HDL 51 10/01/2015 1020   CHOLHDL 3.3 04/03/2016 0933   VLDL 14 04/03/2016 0933   LDLCALC 101 04/03/2016 0933   LDLCALC 89 10/01/2015 1020    HgbA1C:  Lab Results  Component Value Date   HGBA1C 7.2 04/03/2016    Urine Drug Screen:     Component Value Date/Time   LABOPIA NONE DETECTED 06/24/2016 0632   COCAINSCRNUR NONE DETECTED 06/24/2016 0632   LABBENZ NONE DETECTED 06/24/2016 0632   AMPHETMU NONE DETECTED 06/24/2016 0632   THCU NONE DETECTED 06/24/2016 0632   LABBARB NONE DETECTED 06/24/2016 0632    Alcohol Level:  Recent Labs Lab 06/24/16 0632  ETH <5    Other results: EKG: normal sinus rhythm  Imaging: Ct Head Wo Contrast  Result Date: 06/24/2016 CLINICAL DATA:  Slurred speech.  Left arm numbness.  Lung cancer. EXAM: CT HEAD WITHOUT CONTRAST TECHNIQUE: Contiguous axial images were obtained from the base of the skull through the vertex without intravenous contrast. COMPARISON:  CT head 06/17/2016, MRI head 07/2016 FINDINGS: Brain: New areas of wedge-shaped hypodensity in the parietal lobe bilaterally. This involves cortex and was not present previously. This is most consistent with subacute infarction. No associated hemorrhage. Small  hypodensity right cerebellum most consistent with chronic infarction as noted on prior MRI. No acute hemorrhage. Ventricle size normal. No shift of the midline structures. No other areas of acute infarct. Vascular: No hyperdense vessel or unexpected calcification. Skull: Negative Sinuses/Orbits: Negative Other: None IMPRESSION: New areas of wedge-shaped hypodensity in the parietal lobe bilaterally involving cortex most consistent with subacute infarction. In this patient with lung cancer, this is most consistent with infarction rather than metastatic disease, as these findings were not present 1 week ago. Patienthad multiple small areas of embolic infarction on the prior MRI. Current parietal infarcts are also consistent with emboli. No associated hemorrhage.  Electronically Signed   By: Franchot Gallo M.D.   On: 06/24/2016 07:11    Assessment: 65 y.o. male presenting with an episode of left sided weakness and numbness.  Symptoms resolved spontaneously.  Patient on Xarelto with lung cancer and compliant.  Previous work up one week ago unremarkable and include an unremarkable CTA and echocardiogram.  Head CT reviewed and shows evidence of new infarcts.  Again embolic etiology likely.    Stroke Risk Factors - diabetes mellitus, hypercoagulable state, hyperlipidemia and hypertension  Plan: 1. Prophylactic therapy-Continue Xarelto.  Would add ASA at '81mg'$  daily 2. NPO until RN stroke swallow screen 3. Telemetry monitoring 4. Frequent neuro checks 5. Lipid panel, A1c    Alexis Goodell, MD Neurology [phone] 06/24/2016, 12:16 PM

## 2016-06-24 NOTE — ED Provider Notes (Signed)
[facility] Emergency Department Provider Note   ____________________________________________   First MD Initiated Contact with Patient 06/24/16 [phone]     (approximate)  I have reviewed the triage vital signs and the nursing notes.   HISTORY  Chief Complaint DVT and Numbness    HPI Melvin Willis is a 65 y.o. male who comes into the hospital today with some numbness and slurred speech. The patient reports that he has been having mini strokes and he thinks he may have had one tonight. The patient woke up with some left hand numbness. He reports that his head and face became numb and his mouth was twisted. The patient was having some trouble with his speech. This occurred around 1:30. The patient went to bed at midnight. He reports that he Moving his hand and his symptoms improved. The patient's speech also resolved in about 5 minutes. The patient denies any chest pain, blurred vision, headache, nausea, vomiting. He also denies any abdominal pain. The patient has a history of blood clots as well as in his brain. The patient is on Xarelto and has not missed any doses. The patient reports that he was told today that he should start taking aspirin but he has not. The patient was in the cancer center. He is here today for evaluation of these symptoms.The patient's symptoms are all resolved at this time.   Past Medical History:  Diagnosis Date  . Abnormal prostate specific antigen 08/08/2012  . Adiposity 04/16/2015  . CVA (cerebral vascular accident) ([place]) 06/17/2016  . Diabetes mellitus without complication ([place])   . Diverticulosis of sigmoid colon 04/16/2015  . Dyslipidemia 03/18/2015  . Hemorrhoids, internal 04/16/2015  . Hypercholesteremia 04/16/2015  . Hyperlipidemia   . Hypertension   . Primary cancer of left upper lobe of lung ([place])   . Pulmonary embolism ([place])   . Wears dentures    partial upper    Patient Active Problem List   Diagnosis Date Noted  .  Blurred vision, bilateral 06/23/2016  . CVA (cerebral vascular accident) ([place]) 06/17/2016  . Primary cancer of left upper lobe of lung ([place]) 06/12/2016  . Mediastinal mass   . Dyspnea 06/03/2016  . Chest pain 06/03/2016  . DVT (deep venous thrombosis) ([place]) 06/03/2016  . Pulmonary embolism without acute cor pulmonale ([place]) 06/03/2016  . Hilar mass 06/03/2016  . Pharyngeal dysphagia 01/01/2016  . Fatigue due to excessive exertion 01/01/2016  . Benign neoplasm of ascending colon   . Benign neoplasm of sigmoid colon   . Benign neoplasm of transverse colon   . Diverticulosis of large intestine without diverticulitis   . Acute esophagitis   . Gastritis   . Diverticulosis of sigmoid colon 04/16/2015  . Hypercholesteremia 04/16/2015  . Adiposity 04/16/2015  . Diabetes mellitus without complication ([place]) 76/72/0947  . Dyslipidemia 03/18/2015  . Disorder of male genital organ 08/08/2012  . Abnormal prostate specific antigen 08/08/2012    Past Surgical History:  Procedure Laterality Date  . COLONOSCOPY    . COLONOSCOPY WITH PROPOFOL N/A 05/06/2015   Procedure: COLONOSCOPY WITH PROPOFOL;  Surgeon: Lucilla Lame, MD;  Location: [facility];  Service: Endoscopy;  Laterality: N/A;  ASCENDING COLON POLYPS X 2 TERMINAL ILEUM BIOPSY RANDOM COLON BX. TRANSVERSE COLON POLYP SIGMOID COLON POLYP  . ESOPHAGOGASTRODUODENOSCOPY (EGD) WITH PROPOFOL N/A 05/06/2015   Procedure: ESOPHAGOGASTRODUODENOSCOPY (EGD) WITH PROPOFOL;  Surgeon: Lucilla Lame, MD;  Location: [facility];  Service: Endoscopy;  Laterality: N/A;  GASTRIC BIOPSY X1  Prior to Admission medications   Medication Sig Start Date End Date Taking? Authorizing Provider  atorvastatin (LIPITOR) 40 MG tablet Take 40 mg by mouth daily.   Yes Historical Provider, MD  finasteride (PROSCAR) 5 MG tablet Take 5 mg by mouth at bedtime.  03/04/15  Yes Historical Provider, MD  glipiZIDE (GLUCOTROL) 5 MG tablet Take 1 tablet (5 mg total) by  mouth 2 (two) times daily before a meal. 04/03/16  Yes Roselee Nova, MD  lisinopril (PRINIVIL,ZESTRIL) 10 MG tablet TAKE ONE TABLET BY MOUTH ONCE DAILY 06/15/16  Yes Roselee Nova, MD  metFORMIN (GLUCOPHAGE) 1000 MG tablet Take 0.5 tablets (500 mg total) by mouth 2 (two) times daily. 04/03/16  Yes Roselee Nova, MD  tamsulosin (FLOMAX) 0.4 MG CAPS capsule Take 0.4 mg by mouth every morning.  01/24/15  Yes Historical Provider, MD  XARELTO 15 MG TABS tablet Take 15 mg by mouth 2 (two) times daily. 06/06/16  Yes Historical Provider, MD  crizotinib Hulda Humphrey) 250 MG capsule Take 1 capsule (250 mg total) by mouth 2 (two) times daily. Patient not taking: Reported on 06/24/2016 06/19/16   Cammie Sickle, MD    Allergies Review of patient's allergies indicates no known allergies.  Family History  Problem Relation Age of Onset  . Diabetes Mother   . Diabetes Father   . CAD Father   . Dementia Father   . Diabetes Sister   . Cancer Maternal Uncle     Prostate  . Cancer Cousin     prostate    Social History Social History  Substance Use Topics  . Smoking status: Never Smoker  . Smokeless tobacco: Never Used  . Alcohol use No    Review of Systems Constitutional: No fever/chills Eyes: No visual changes. ENT: No sore throat. Cardiovascular: Denies chest pain. Respiratory: Denies shortness of breath. Gastrointestinal: No abdominal pain.  No nausea, no vomiting.  No diarrhea.  No constipation. Genitourinary: Negative for dysuria. Musculoskeletal: Negative for back pain. Skin: Negative for rash. Neurological: Left upper extremity numbness, slurred speech, facial numbness.  10-point ROS otherwise negative.  ____________________________________________   PHYSICAL EXAM:  VITAL SIGNS: ED Triage Vitals  Enc Vitals Group     BP 06/24/16 0528 (!) 144/73     Pulse Rate 06/24/16 0528 95     Resp 06/24/16 0528 20     Temp 06/24/16 0528 98 F (36.7 C)     Temp Source 06/24/16  0528 Oral     SpO2 06/24/16 0528 99 %     Weight 06/24/16 0529 142 lb (64.4 kg)     Height 06/24/16 0529 '5\' 4"'$  (1.626 m)     Head Circumference --      Peak Flow --      Pain Score 06/24/16 0529 1     Pain Loc --      Pain Edu? --      Excl. in [place]? --     Constitutional: Alert and oriented. Well appearing and in Mild distress. Eyes: Conjunctivae are normal. PERRL. EOMI. Head: Atraumatic. Nose: No congestion/rhinnorhea. Mouth/Throat: Mucous membranes are moist.  Oropharynx non-erythematous. Cardiovascular: Normal rate, regular rhythm. Grossly normal heart sounds.  Good peripheral circulation. Respiratory: Normal respiratory effort.  No retractions. Lungs CTAB. Gastrointestinal: Soft and nontender. No distention. Positive bowel sounds Musculoskeletal: No lower extremity tenderness nor edema.   Neurologic:  Normal speech and language. Cranial nerves II through XII are grossly intact with no focal motor or neuro  deficits. Patient has no pronator drift good finger to nose and good lower extremity strength. Skin:  Skin is warm, dry and intact.  Psychiatric: Mood and affect are normal.   ____________________________________________   LABS (all labs ordered are listed, but only abnormal results are displayed)  Labs Reviewed  CBC - Abnormal; Notable for the following:       Result Value   RBC 4.38 (*)    Hemoglobin 12.4 (*)    HCT 35.9 (*)    All other components within normal limits  COMPREHENSIVE METABOLIC PANEL - Abnormal; Notable for the following:    Sodium 134 (*)    Glucose, Bld 151 (*)    All other components within normal limits  TROPONIN I - Abnormal; Notable for the following:    Troponin I 0.73 (*)    All other components within normal limits  PROTIME-INR - Abnormal; Notable for the following:    Prothrombin Time 26.9 (*)    All other components within normal limits  DIFFERENTIAL - Abnormal; Notable for the following:    Neutro Abs 6.7 (*)    Lymphs Abs 0.9 (*)     All other components within normal limits  ETHANOL  APTT  URINALYSIS COMPLETEWITH MICROSCOPIC ([facility] ONLY)  URINE DRUG SCREEN, QUALITATIVE ([facility] ONLY)   ____________________________________________  EKG  ED ECG REPORT I, Loney Hering, the attending physician, personally viewed and interpreted this ECG.   Date: 06/24/2016  EKG Time: 642  Rate: 85  Rhythm: normal sinus rhythm  Axis: normal  Intervals:none  ST&T Change: flipped t waves in lead III  ____________________________________________  RADIOLOGY  CT head ____________________________________________   PROCEDURES  Procedure(s) performed: None  Procedures  Critical Care performed: No  ____________________________________________   INITIAL IMPRESSION / ASSESSMENT AND PLAN / ED COURSE  Pertinent labs & imaging results that were available during my care of the patient were reviewed by me and considered in my medical decision making (see chart for details).  This is a 65 year old male who comes into the hospital today after having an episode of slurred speech, facial twisting and numbness. The patient was admitted on October 11 with a stroke. He also has been having multiple clots. The patient is on Xarelto but is not taking aspirin. I will check some blood work as well as a CT scan. The patient is not having any symptoms at this time. His NIH stroke scale is 0. The patient will be reassessed once I received the results of his blood work.  Clinical Course  Value Comment By Time  CT Head Wo Contrast New areas of wedge-shaped hypodensity in the parietal lobe bilaterally involving cortex most consistent with subacute infarction. In this patient with lung cancer, this is most consistent with infarction rather than metastatic disease, as these findings were not present 1 week ago. Patienthad multiple small areas of embolic infarction on the prior MRI. Current parietal infarcts are also consistent with emboli. No  associated hemorrhage   Loney Hering, MD 10/18 [phone]   The patient appears to have some new areas of stroke in the parietal lobe bilaterally. It appears that the patient has a subacute infarction. The patient also has an elevated troponin. I will admit the patient to the hospitalist service for further evaluation. The patient agrees at this plan as stated.  ____________________________________________   FINAL CLINICAL IMPRESSION(S) / ED DIAGNOSES  Final diagnoses:  Cerebrovascular accident (CVA), unspecified mechanism ([place])  Paresthesia  Elevated troponin  NEW MEDICATIONS STARTED DURING THIS VISIT:  New Prescriptions   No medications on file     Note:  This document was prepared using Dragon voice recognition software and may include unintentional dictation errors.    Loney Hering, MD 06/24/16 [phone]

## 2016-06-24 NOTE — Consult Note (Signed)
[name]  Patient Care Team: Roselee Nova, MD as PCP - General (Family Medicine)  CHIEF COMPLAINTS/PURPOSE OF CONSULTATION: Metastatic lung cancer; multiple strokes  HISTORY OF PRESENTING ILLNESS:  Melvin Willis 65 y.o.  male history of newly diagnosed metastatic lung cancer adenocarcinoma- ROS-1 Mutated- and a recent history of stroke- on Xarelto; currently admitted to the hospital for facial tingling and numbness and droop. He has been evaluated by neurology; and also evaluated by cardiology for slightly elevated troponin. Aspirin is added.  Patient continues to have intermittent tingling and numbness of his face. Denies any headaches. No nausea no vomiting. No worsening shortness of breath or chest pain. He admits to compliance with his Xarelto.   ROS: A complete 10 point review of system is done which is negative except mentioned above in history of present illness  MEDICAL HISTORY:  Past Medical History:  Diagnosis Date  . Abnormal prostate specific antigen 08/08/2012  . Adiposity 04/16/2015  . CVA (cerebral vascular accident) ([place]) 06/17/2016  . Diabetes mellitus without complication ([place])   . Diverticulosis of sigmoid colon 04/16/2015  . Dyslipidemia 03/18/2015  . Hemorrhoids, internal 04/16/2015  . Hypercholesteremia 04/16/2015  . Hyperlipidemia   . Hypertension   . Primary cancer of left upper lobe of lung ([place])   . Pulmonary embolism ([place])   . Wears dentures    partial upper    SURGICAL HISTORY: Past Surgical History:  Procedure Laterality Date  . COLONOSCOPY    . COLONOSCOPY WITH PROPOFOL N/A 05/06/2015   Procedure: COLONOSCOPY WITH PROPOFOL;  Surgeon: Lucilla Lame, MD;  Location: [facility];  Service: Endoscopy;  Laterality: N/A;  ASCENDING COLON POLYPS X 2 TERMINAL ILEUM BIOPSY RANDOM COLON BX. TRANSVERSE COLON POLYP SIGMOID COLON POLYP  . ESOPHAGOGASTRODUODENOSCOPY (EGD) WITH PROPOFOL N/A 05/06/2015   Procedure:  ESOPHAGOGASTRODUODENOSCOPY (EGD) WITH PROPOFOL;  Surgeon: Lucilla Lame, MD;  Location: [facility];  Service: Endoscopy;  Laterality: N/A;  GASTRIC BIOPSY X1    SOCIAL HISTORY: Social History   Social History  . Marital status: Married    Spouse name: N/A  . Number of children: N/A  . Years of education: N/A   Occupational History  . Not on file.   Social History Main Topics  . Smoking status: Never Smoker  . Smokeless tobacco: Never Used  . Alcohol use No  . Drug use: No  . Sexual activity: Yes    Partners: Female   Other Topics Concern  . Not on file   Social History Narrative  . No narrative on file    FAMILY HISTORY: Family History  Problem Relation Age of Onset  . Diabetes Mother   . Diabetes Father   . CAD Father   . Dementia Father   . Diabetes Sister   . Cancer Maternal Uncle     Prostate  . Cancer Cousin     prostate    ALLERGIES:  has No Known Allergies.  MEDICATIONS:  Current Facility-Administered Medications  Medication Dose Route Frequency Provider Last Rate Last Dose  . acetaminophen (TYLENOL) tablet 650 mg  650 mg Oral Q6H PRN Lytle Butte, MD   650 mg at 06/24/16 1208   Or  . acetaminophen (TYLENOL) suppository 650 mg  650 mg Rectal Q6H PRN Lytle Butte, MD      . Derrill Memo ON 06/25/2016] aspirin EC tablet 81 mg  81 mg Oral Daily Alexis Goodell, MD      . atorvastatin (LIPITOR) tablet  40 mg  40 mg Oral Daily Lytle Butte, MD   40 mg at 06/24/16 1000  . enoxaparin (LOVENOX) injection 65 mg  65 mg Subcutaneous Q12H Lytle Butte, MD      . finasteride (PROSCAR) tablet 5 mg  5 mg Oral QHS Lytle Butte, MD      . insulin aspart (novoLOG) injection 0-5 Units  0-5 Units Subcutaneous QHS Lytle Butte, MD      . insulin aspart (novoLOG) injection 0-9 Units  0-9 Units Subcutaneous TID WC Lytle Butte, MD   2 Units at 06/24/16 1732  . lisinopril (PRINIVIL,ZESTRIL) tablet 10 mg  10 mg Oral Daily Lytle Butte, MD   10 mg at 06/24/16 1000  .  ondansetron (ZOFRAN) tablet 4 mg  4 mg Oral Q6H PRN Lytle Butte, MD       Or  . ondansetron [employer]) injection 4 mg  4 mg Intravenous Q6H PRN Lytle Butte, MD      . oxyCODONE (Oxy IR/ROXICODONE) immediate release tablet 5 mg  5 mg Oral Q4H PRN Lytle Butte, MD      . sodium chloride flush (NS) 0.9 % injection 3 mL  3 mL Intravenous Q12H Lytle Butte, MD   3 mL at 06/24/16 1000  . tamsulosin (FLOMAX) capsule 0.4 mg  0.4 mg Oral q morning - 10a Lytle Butte, MD   0.4 mg at 06/24/16 1000      .  PHYSICAL EXAMINATION:  Vitals:   06/24/16 1402 06/24/16 1645  BP: 131/67 128/72  Pulse: 81 82  Resp: 18 18  Temp: 97.6 F (36.4 C) 97.7 F (36.5 C)   Filed Weights   06/24/16 0529 06/24/16 0926  Weight: 142 lb (64.4 kg) 140 lb 11.2 oz (63.8 kg)    GENERAL: Well-nourished well-developed; Alert, no distress and comfortable.   Accompanied by his wife; daughter and son. EYES: no pallor or icterus OROPHARYNX: no thrush or ulceration. NECK: supple, no masses felt LYMPH:  no palpable lymphadenopathy in the cervical, axillary or inguinal regions LUNGS: decreased breath sounds to auscultation at bases and  No wheeze or crackles HEART/CVS: regular rate & rhythm and no murmurs; No lower extremity edema ABDOMEN: abdomen soft, non-tender and normal bowel sounds Musculoskeletal:no cyanosis of digits and no clubbing  PSYCH: alert & oriented x 3 with fluent speech; facial droop noted.  NEURO: no focal motor/sensory deficits SKIN:  no rashes or significant lesions  LABORATORY DATA:  I have reviewed the data as listed Lab Results  Component Value Date   WBC 8.4 06/24/2016   HGB 12.4 (L) 06/24/2016   HCT 35.9 (L) 06/24/2016   MCV 81.8 06/24/2016   PLT 293 06/24/2016    Recent Labs  06/03/16 1630  06/17/16 1731 06/19/16 1110 06/24/16 0632  NA 135  < > 136 134* 134*  K 4.2  < > 4.5 4.3 4.6  CL 102  < > 103 99* 101  CO2 25  < > '24 24 24  ' GLUCOSE 126*  < > 210* 241* 151*  BUN 13  <  > '17 14 16  ' CREATININE 1.23  < > 1.29* 1.22 1.07  CALCIUM 9.6  < > 9.9 9.7 9.7  GFRNONAA >60  < > 58* >60 >60  GFRAA >60  < > >60 >60 >60  PROT 8.1  --   --  8.0 7.5  ALBUMIN 4.2  --   --  4.0 3.8  AST 19  --   --  25 25  ALT 19  --   --  37 41  ALKPHOS 92  --   --  82 77  BILITOT 0.9  --   --  0.5 0.8  < > = values in this interval not displayed.  RADIOGRAPHIC STUDIES: I have personally reviewed the radiological images as listed and agreed with the findings in the report. Ct Angio Head W Or Wo Contrast  Result Date: 06/17/2016 CLINICAL DATA:  Stroke on MRI earlier today. EXAM: CT ANGIOGRAPHY HEAD AND NECK TECHNIQUE: Multidetector CT imaging of the head and neck was performed using the standard protocol during bolus administration of intravenous contrast. Multiplanar CT image reconstructions and MIPs were obtained to evaluate the vascular anatomy. Carotid stenosis measurements (when applicable) are obtained utilizing NASCET criteria, using the distal internal carotid diameter as the denominator. CONTRAST:  Study 5 cc Isovue 370 intravenous COMPARISON:  Brain MRI from earlier today FINDINGS: CT HEAD FINDINGS Brain: Known acute infarcts are not clearly visible. No hemorrhage, hydrocephalus, or shift. Vascular: No hyperdense vessel or unexpected calcification. Skull: 7 mm lucency in the lateral right orbital rim, which was described on brain MRI from earlier today. CT features favor a benign process. Sinuses: Right mastoiditis. Orbits: No acute finding Review of the MIP images confirms the above findings CTA NECK FINDINGS Aortic arch: Normal size and appearance.  Three vessel branching. Right carotid system: Mild atherosclerotic plaque at the carotid bifurcation without stenosis or dissection. No ulceration. Left carotid system: Mild atherosclerotic calcification at the bifurcation. No stenosis, dissection, or ulceration. Vertebral arteries: No proximal subclavian artery stenosis. Codominant vertebral  arteries are smooth and widely patent to the dura. Skeleton: No acute or aggressive finding in the neck. Other neck: Known adenopathy at the thoracic inlet and left supraclavicular fossa. Multi nodular goiter. Upper chest: Known left upper lobe malignancy with volume loss. No pulmonary emboli, partly seen on the left today. Review of the MIP images confirms the above findings CTA HEAD FINDINGS Anterior circulation: Symmetric carotid arteries. Symmetric branching. No major vessel occlusion, stenosis, or aneurysm. Posterior circulation:  Standard anatomy Venous sinuses: As permitted by contrast timing, patent. Anatomic variants: None significant Delayed phase: Posterior right frontal enhancement seen on brain MRI from earlier today may be subtly visible. Review of the MIP images confirms the above findings IMPRESSION: 1. Known multiple acute infarcts by MRI. No major vessel occlusion, flow limiting stenosis, or visible arterial embolic source. In this patient with recent pulmonary embolism, recommend workup for PFO. 2. Mild atherosclerosis. 3. 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 Electronically Signed   By: Monte Fantasia M.D.   On: 06/17/2016 20:23   Ct Head Wo Contrast  Result Date: 06/24/2016 CLINICAL DATA:  Slurred speech.  Left arm numbness.  Lung cancer. EXAM: CT HEAD WITHOUT CONTRAST TECHNIQUE: Contiguous axial images were obtained from the base of the skull through the vertex without intravenous contrast. COMPARISON:  CT head 06/17/2016, MRI head 07/2016 FINDINGS: Brain: New areas of wedge-shaped hypodensity in the parietal lobe bilaterally. This involves cortex and was not present previously. This is most consistent with subacute infarction. No associated hemorrhage. Small hypodensity right cerebellum most consistent with chronic infarction as noted on prior MRI. No acute hemorrhage. Ventricle size normal. No shift of the midline structures. No other areas of  acute infarct. Vascular: No hyperdense vessel or unexpected calcification. Skull: Negative Sinuses/Orbits: Negative Other: None IMPRESSION: New areas of wedge-shaped hypodensity in the parietal lobe bilaterally involving cortex most consistent  with subacute infarction. In this patient with lung cancer, this is most consistent with infarction rather than metastatic disease, as these findings were not present 1 week ago. Patienthad multiple small areas of embolic infarction on the prior MRI. Current parietal infarcts are also consistent with emboli. No associated hemorrhage. Electronically Signed   By: Franchot Gallo M.D.   On: 06/24/2016 07:11   Ct Angio Neck W Or Wo Contrast  Result Date: 06/17/2016 CLINICAL DATA:  Stroke on MRI earlier today. EXAM: CT ANGIOGRAPHY HEAD AND NECK TECHNIQUE: Multidetector CT imaging of the head and neck was performed using the standard protocol during bolus administration of intravenous contrast. Multiplanar CT image reconstructions and MIPs were obtained to evaluate the vascular anatomy. Carotid stenosis measurements (when applicable) are obtained utilizing NASCET criteria, using the distal internal carotid diameter as the denominator. CONTRAST:  Study 5 cc Isovue 370 intravenous COMPARISON:  Brain MRI from earlier today FINDINGS: CT HEAD FINDINGS Brain: Known acute infarcts are not clearly visible. No hemorrhage, hydrocephalus, or shift. Vascular: No hyperdense vessel or unexpected calcification. Skull: 7 mm lucency in the lateral right orbital rim, which was described on brain MRI from earlier today. CT features favor a benign process. Sinuses: Right mastoiditis. Orbits: No acute finding Review of the MIP images confirms the above findings CTA NECK FINDINGS Aortic arch: Normal size and appearance.  Three vessel branching. Right carotid system: Mild atherosclerotic plaque at the carotid bifurcation without stenosis or dissection. No ulceration. Left carotid system: Mild  atherosclerotic calcification at the bifurcation. No stenosis, dissection, or ulceration. Vertebral arteries: No proximal subclavian artery stenosis. Codominant vertebral arteries are smooth and widely patent to the dura. Skeleton: No acute or aggressive finding in the neck. Other neck: Known adenopathy at the thoracic inlet and left supraclavicular fossa. Multi nodular goiter. Upper chest: Known left upper lobe malignancy with volume loss. No pulmonary emboli, partly seen on the left today. Review of the MIP images confirms the above findings CTA HEAD FINDINGS Anterior circulation: Symmetric carotid arteries. Symmetric branching. No major vessel occlusion, stenosis, or aneurysm. Posterior circulation:  Standard anatomy Venous sinuses: As permitted by contrast timing, patent. Anatomic variants: None significant Delayed phase: Posterior right frontal enhancement seen on brain MRI from earlier today may be subtly visible. Review of the MIP images confirms the above findings IMPRESSION: 1. Known multiple acute infarcts by MRI. No major vessel occlusion, flow limiting stenosis, or visible arterial embolic source. In this patient with recent pulmonary embolism, recommend workup for PFO. 2. Mild atherosclerosis. 3. 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 Electronically Signed   By: Monte Fantasia M.D.   On: 06/17/2016 20:23   Ct Angio Chest W/cm &/or Wo Cm  Result Date: 06/03/2016 CLINICAL DATA:  Severe left chest pain radiating down arm since this morning. Shortness of breath and fatigue since lithotripsy approximately 1 month ago. EXAM: CT ANGIOGRAPHY CHEST WITH CONTRAST TECHNIQUE: Multidetector CT imaging of the chest was performed using the standard protocol during bolus administration of intravenous contrast. Multiplanar CT image reconstructions and MIPs were obtained to evaluate the vascular anatomy. CONTRAST:  100 cc Isovue 370 COMPARISON:  None. FINDINGS:  Cardiovascular: Multiple pulmonary emboli within the central segmental pulmonary arteries to the bilateral lower lobes, right greater than left. Several additional small pulmonary emboli within the central segmental pulmonary artery branches to the right upper lobe. No evidence of associated right heart failure. Heart size is normal. No pericardial effusion. Thoracic aorta is normal in caliber. No  aortic dissection. Mediastinum/Nodes: Numerous small and enlarged lymph nodes within the mediastinum, largest lymph nodes within the anterior mediastinum. Representative lymph node within the anterior mediastinum measures 1.9 cm short axis dimension (series 5, image 33). Additional conglomerate lymphadenopathy noted within the precarinal space measuring approximately 3.6 x 2.1 cm. Conglomerate subcarinal lymphadenopathy measures approximately 3.2 x 2.4 cm. Additional suspicious lymph nodes are seen in the right upper paratracheal region (series 5, images 14 through 23). Several mildly prominent supraclavicular lymph nodes are seen bilaterally, largest on the right measuring 1.1 cm short axis dimension (series 5, image 10). Esophagus and trachea are unremarkable. Lungs/Pleura: Dense consolidation is seen within the anterior segment of the left upper lobe, favored to represent airspace collapse secondary to a central obstructing mass. The presumed central obstructing mass is not clearly demarcated. The bronchus to the anterior segment of the left upper lobe terminates at the proximal margin of the consolidation. Lungs otherwise clear.  No pleural effusion. Upper Abdomen: Hypodense masses are seen within the bilateral liver lobes, largest of which is in the left liver lobe demonstrating CT density measurements compatible with benign cysts (2 abutting cysts with largest component measuring 3 cm greatest dimension. A smaller lesion in the right liver lobe measures 1.6 x 1.6 cm, too small to definitively characterize. Limited  images of the upper abdomen are otherwise unremarkable. Musculoskeletal: No acute or suspicious osseous finding. Superficial soft tissues are unremarkable. Review of the MIP images confirms the above findings. IMPRESSION: 1. Bilateral small pulmonary emboli within the central segmental pulmonary arteries to the right lower lobe, right upper lobe and left lower lobe. No evidence of associated right heart failure. 2. Large dense consolidation within the anterior segment of the left upper lobe, almost certainly postobstructive airspace collapse related to a central obstructing mass. The presumed central obstructing mass, however, is not clearly demarcated but is likely at the level of the acutely occluded left upper lobe bronchus. There is additional obstruction of the adjacent left upper lobe pulmonary vein. 3. Fairly extensive mediastinal lymphadenopathy, with measurements given above. Additional mildly prominent lymph nodes within the supraclavicular regions are also suspicious for metastatic lymphadenopathy. 4. Hypodense masses within each liver lobe, largest located within the left liver lobe has the appearance of 2 abutting simple cysts based on CT density measurements. The lesion within the right liver lobe is too small to definitively characterize and requires further characterization with MRI to exclude metastasis. Critical Value/emergent results were called by telephone at the time of interpretation on 06/03/2016 at 3:30 pm to Dr. Dossie Der [facility] , who verbally acknowledged these results. Electronically Signed   By: Franki Cabot M.D.   On: 06/03/2016 15:35   Mr Jeri Cos DX Contrast  Result Date: 06/17/2016 CLINICAL DATA:  Lung cancer staging. EXAM: MRI HEAD WITHOUT AND WITH CONTRAST TECHNIQUE: Multiplanar, multiecho pulse sequences of the brain and surrounding structures were obtained without and with intravenous contrast. CONTRAST:  25m MULTIHANCE GADOBENATE DIMEGLUMINE 529 MG/ML IV SOLN COMPARISON:  None.  FINDINGS: Brain: Numerous sub cm foci of restricted diffusion throughout the bilateral cerebellum and along the bilateral cerebral convexities, anterior and posterior circulation. No hemorrhagic changes within these areas. There are 2 areas of sub cm cortical enhancement in the right frontal parietal cortex which on sagittal acquisition have a flat morphology. Favor subacute enhancing infarcts over metastases. Will need short-term follow-up scan. No hydrocephalus. Vascular: Normal flow voids. Skull and upper cervical spine: 9 mm area of T2 hyperintense and T1 hypo intense, enhancing signal  abnormality in the lateral rim right orbit. Sinuses/Orbits: Right mastoid effusion with negative nasopharynx. Other: These results were called by telephone at the time of interpretation on 06/17/2016 at 2:42 pm to Dr. Charlaine Dalton , who verbally acknowledged these results. IMPRESSION: 1. Numerous small acute infarcts in multiple vascular distributions. In this patient with recent pulmonary embolism a PFO workup is recommended. 2. 2 subcentimeter foci of cortically based enhancement in the right cerebral hemisphere, favor subacute enhancing infarcts. Recommend follow-up scan in 2-3 months to exclude metastasis from patient's lung cancer. 3. 9 mm bone lesion in the right lateral orbital rim, metastasis versus developmental inclusion cyst (which occur in this location). Attention on follow-up. 4. Right mastoid effusion with negative nasopharynx. Electronically Signed   By: Monte Fantasia M.D.   On: 06/17/2016 14:44   Ct Abdomen Pelvis W Contrast  Result Date: 06/04/2016 CLINICAL DATA:  A recent chest CT demonstrated pulmonary emboli eye and concern for malignancy. Possible liver lesion on chest CT. EXAM: CT ABDOMEN AND PELVIS WITH CONTRAST TECHNIQUE: Multidetector CT imaging of the abdomen and pelvis was performed using the standard protocol following bolus administration of intravenous contrast. CONTRAST:  156m  ISOVUE-300 IOPAMIDOL (ISOVUE-300) INJECTION 61% COMPARISON:  Chest CTA 06/03/2016 FINDINGS: Lower chest: Lung bases are clear without pleural fluid. A few of the known pulmonary emboli are visualized at the lung bases. Hepatobiliary: There are low-density structures scattered throughout the liver. The largest structures are water attenuation and compatible with cysts. The largest is a bilobed or slightly complex cyst near the left hepatic dome measuring up to 4.4 cm. Some of the smaller structures are too small to definitively characterize. Portal venous system is patent. Normal appearance of the gallbladder. No biliary dilatation. Pancreas: Focal fat near the junction of pancreatic head and uncinate process. Otherwise, normal appearance of the pancreas. Spleen: Peripheral low-density structure along the superior spleen measures roughly 2.2 cm. This could be related to a splenic infarct. Remainder of the spleen has a normal appearance. Adrenals/Urinary Tract: Indeterminate 1.2 cm nodule in the left adrenal gland. Questionable nodule along the right adrenal gland medial limb. 1.5 cm cortical cyst in left kidney upper pole. No hydronephrosis. No suspicious renal lesions. Bladder is distended and the top the bladder extends up to the umbilicus. Stomach/Bowel: The stomach is unremarkable. Colonic diverticulosis without acute colonic inflammation. Normal appendix. Mild wall thickening of the distal stomach is probably within normal limits. Vascular/Lymphatic: Aortic atherosclerosis without aneurysm. Incidentally, there is a circumaortic left renal vein. Enlarged lymph nodes in the lower chest around the descending thoracic aorta and esophagus. Index lymph node on sequence 2, image 12 measures 1.4 cm. Enlarged lymph node in the left external iliac chain on sequence 2, image 62 measures 2.4 x 1.9 cm. Reproductive: Prostate is massively enlarged measuring 7.5 x 7.6 x 7.6 cm. In addition, the prostate is very nodular with  large nodular components extending into the bladder base and a large nodular component along the right posterior aspect of the prostate. Other: No free fluid.  Small umbilical hernia containing fat. Musculoskeletal: Heterogeneity in the right iliac bone near the right SI joint is nonspecific. Disc space narrowing with endplate disease at LQ2-W9 IMPRESSION: Enlarged lymph nodes in the lower chest are compatible with metastatic disease seen on the prior chest CT. In addition, there is an indeterminate 1.2 cm left adrenal nodule which could represent metastatic disease. Question a small right adrenal lesion. Enlarged lymph node in the the left pelvis could also represent a  metastatic lesion. Recommend further characterization of the lymphadenopathy and presumed metastatic disease with a PET-CT. Numerous hypodense structures throughout the liver. The largest of these lesions are compatible with cysts. Massive enlargement of the prostate with diffuse nodularity. This could represent benign prostatic hypertrophy and recommend correlation with PSA level. Urinary bladder is distended and suspect there is a component of bladder outlet obstruction associated with this prostate hypertrophy. Wedge-shaped defect in the spleen. This could represent a small splenic infarct but indeterminate. Known pulmonary emboli. Electronically Signed   By: Markus Daft M.D.   On: 06/04/2016 16:02   US Biopsy  Result Date: 06/05/2016 CLINICAL DATA:  Anterior left lung mass. Mediastinal and bilateral supraclavicular adenopathy. Liver lesions. EXAM: ULTRASOUND GUIDED CORE BIOPSY OF LEFT SUPRACLAVICULAR ADENOPATHY MEDICATIONS: Lidocaine 1% subcutaneous PROCEDURE: The procedure, risks, benefits, and alternatives were explained to the patient. Questions regarding the procedure were encouraged and answered. The patient understands and consents to the procedure. Survey ultrasound of the left supraclavicular region was performed. The pathologic  adenopathy was localized and an appropriate skin entry site determined and marked. The operative field was prepped with chlorhexidine in a sterile fashion, and a sterile drape was applied covering the operative field. A sterile gown and sterile gloves were used for the procedure. Local anesthesia was provided with 1% Lidocaine. Under real-time ultrasound guidance, a 17 gauge trocar needle was advanced to the margin of the lesion. Once needle tip position was confirmed, coaxial 18-gauge core biopsy samples were obtained, submitted in saline to surgical pathology. The guide needle was removed. The patient tolerated the procedure well. COMPLICATIONS: None. FINDINGS: Left supraclavicular adenopathy was confirmed. Core biopsy samples obtained under ultrasound guidance as above. IMPRESSION: 1. Technically successful ultrasound-guided core biopsy of left supraclavicular pathologic adenopathy. Electronically Signed   By: Lucrezia Europe M.D.   On: 06/05/2016 16:08   US Venous Img Lower Unilateral Left  Result Date: 06/03/2016 CLINICAL DATA:  Subacute onset of left lower extremity pain. Known pulmonary embolus. Initial encounter. EXAM: LEFT LOWER EXTREMITY VENOUS DOPPLER ULTRASOUND TECHNIQUE: Gray-scale sonography with graded compression, as well as color Doppler and duplex ultrasound were performed to evaluate the lower extremity deep venous systems from the level of the common femoral vein and including the common femoral, femoral, profunda femoral, popliteal and calf veins including the posterior tibial, peroneal and gastrocnemius veins when visible. The superficial great saphenous vein was also interrogated. Spectral Doppler was utilized to evaluate flow at rest and with distal augmentation maneuvers in the common femoral, femoral and popliteal veins. COMPARISON:  None. FINDINGS: Contralateral Common Femoral Vein: Respiratory phasicity is normal and symmetric with the symptomatic side. No evidence of thrombus. Normal  compressibility. Common Femoral Vein: No evidence of thrombus. Normal compressibility, respiratory phasicity and response to augmentation. Saphenofemoral Junction: No evidence of thrombus. Normal compressibility and flow on color Doppler imaging. Profunda Femoral Vein: No evidence of thrombus. Normal compressibility and flow on color Doppler imaging. Femoral Vein: No evidence of thrombus. Normal compressibility, respiratory phasicity and response to augmentation. Popliteal Vein: No evidence of thrombus. Normal compressibility, respiratory phasicity and response to augmentation. Calf Veins: Occlusive thrombus is noted within one of the patient's left posterior tibial veins. The remaining visualized calf veins are unremarkable. Superficial Great Saphenous Vein: No evidence of thrombus. Normal compressibility and flow on color Doppler imaging. Venous Reflux:  None. Other Findings:  None. IMPRESSION: Occlusive deep venous thrombus noted within one of the patient's posterior tibial veins. These results were called by telephone at the time  of interpretation on 06/03/2016 at 6:16 pm to Dr. Harvest Dark, who verbally acknowledged these results. Electronically Signed   By: Garald Balding M.D.   On: 06/03/2016 18:19    ASSESSMENT & PLAN:   # 65 year old male patient with recently diagnosed metastatic adenocarcinoma the lung; and recent acute stroke on Xarelto is currently admitted to the hospital for new onset strokelike symptoms/ also evident on CT scan.  # Metastatic adenocarcinoma the lung- ROS-1; initiated prescription /multiple phone calls. Drug still not available to the patient. Given the delay in the validity of this oral chemotherapy drug; and face of the repeated strokes ; recommend starting chemotherapy with carboplatin and Alimta ASAP.  Discussed with pharmacy; Chemotherapy tomorrow.   # Strokes [recent DVT/PE] question embolic recent 2-D echo negative; await cardiology evaluation. Recommend Lovenox 1  mg/kg twice a day; along with aspirin. Discontinue Xarelto. Agree the patient is at high risk for bleeding. Discussed Dr. Doy Mince from neurology.   #The above plan of care was discussed with the patient/family in detail. They agree.  The plan of care was also discussed with Dr. Lavetta Nielsen.   Thank you Dr. Lavetta Nielsen for allowing me to participate in the care of your pleasant patient. Please do not hesitate to contact me with questions or concerns in the interim.   All questions were answered. The patient knows to call the clinic with any problems, questions or concerns.   Cammie Sickle, MD 06/24/2016 6:28 PM

## 2016-06-24 NOTE — Progress Notes (Signed)
Dr. Lavetta Nielsen made aware of elevated critical troponin of 2.73. No new orders. Cardiology consulting. Madlyn Frankel, RN

## 2016-06-24 NOTE — ED Notes (Signed)
Patient transported to CT 

## 2016-06-24 NOTE — Telephone Encounter (Signed)
[id number]-  Case number for new prior auth assigned

## 2016-06-25 ENCOUNTER — Encounter: Payer: Self-pay | Admitting: Internal Medicine

## 2016-06-25 ENCOUNTER — Other Ambulatory Visit: Payer: Self-pay | Admitting: Internal Medicine

## 2016-06-25 DIAGNOSIS — G629 Polyneuropathy, unspecified: Secondary | ICD-10-CM | POA: Diagnosis not present

## 2016-06-25 DIAGNOSIS — Z9221 Personal history of antineoplastic chemotherapy: Secondary | ICD-10-CM | POA: Diagnosis not present

## 2016-06-25 DIAGNOSIS — I639 Cerebral infarction, unspecified: Secondary | ICD-10-CM | POA: Diagnosis not present

## 2016-06-25 DIAGNOSIS — E7800 Pure hypercholesterolemia, unspecified: Secondary | ICD-10-CM | POA: Diagnosis present

## 2016-06-25 DIAGNOSIS — C3412 Malignant neoplasm of upper lobe, left bronchus or lung: Secondary | ICD-10-CM | POA: Diagnosis present

## 2016-06-25 DIAGNOSIS — R200 Anesthesia of skin: Secondary | ICD-10-CM | POA: Diagnosis present

## 2016-06-25 DIAGNOSIS — Z8673 Personal history of transient ischemic attack (TIA), and cerebral infarction without residual deficits: Secondary | ICD-10-CM | POA: Diagnosis not present

## 2016-06-25 DIAGNOSIS — Z79899 Other long term (current) drug therapy: Secondary | ICD-10-CM | POA: Diagnosis not present

## 2016-06-25 DIAGNOSIS — C3490 Malignant neoplasm of unspecified part of unspecified bronchus or lung: Secondary | ICD-10-CM | POA: Diagnosis not present

## 2016-06-25 DIAGNOSIS — C799 Secondary malignant neoplasm of unspecified site: Secondary | ICD-10-CM | POA: Diagnosis not present

## 2016-06-25 DIAGNOSIS — R4781 Slurred speech: Secondary | ICD-10-CM | POA: Diagnosis present

## 2016-06-25 DIAGNOSIS — E785 Hyperlipidemia, unspecified: Secondary | ICD-10-CM | POA: Diagnosis present

## 2016-06-25 DIAGNOSIS — R202 Paresthesia of skin: Secondary | ICD-10-CM | POA: Diagnosis present

## 2016-06-25 DIAGNOSIS — R29810 Facial weakness: Secondary | ICD-10-CM | POA: Diagnosis present

## 2016-06-25 DIAGNOSIS — I2699 Other pulmonary embolism without acute cor pulmonale: Secondary | ICD-10-CM | POA: Diagnosis present

## 2016-06-25 DIAGNOSIS — I6340 Cerebral infarction due to embolism of unspecified cerebral artery: Secondary | ICD-10-CM | POA: Diagnosis present

## 2016-06-25 DIAGNOSIS — Z7901 Long term (current) use of anticoagulants: Secondary | ICD-10-CM | POA: Diagnosis not present

## 2016-06-25 DIAGNOSIS — I82409 Acute embolism and thrombosis of unspecified deep veins of unspecified lower extremity: Secondary | ICD-10-CM | POA: Diagnosis present

## 2016-06-25 DIAGNOSIS — Z86711 Personal history of pulmonary embolism: Secondary | ICD-10-CM | POA: Diagnosis not present

## 2016-06-25 DIAGNOSIS — Z86010 Personal history of colonic polyps: Secondary | ICD-10-CM | POA: Diagnosis not present

## 2016-06-25 DIAGNOSIS — E119 Type 2 diabetes mellitus without complications: Secondary | ICD-10-CM | POA: Diagnosis present

## 2016-06-25 DIAGNOSIS — Z7984 Long term (current) use of oral hypoglycemic drugs: Secondary | ICD-10-CM | POA: Diagnosis not present

## 2016-06-25 DIAGNOSIS — I10 Essential (primary) hypertension: Secondary | ICD-10-CM | POA: Diagnosis present

## 2016-06-25 LAB — LIPID PANEL
CHOLESTEROL: 107 mg/dL (ref 0–200)
HDL: 38 mg/dL — AB (ref >40)
LDL Cholesterol: 54 mg/dL (ref 0–99)
Total CHOL/HDL Ratio: 2.8 RATIO
Triglycerides: 75 mg/dL (ref <150)
VLDL: 15 mg/dL (ref 0–40)

## 2016-06-25 LAB — SURGICAL PATHOLOGY

## 2016-06-25 LAB — GLUCOSE, CAPILLARY
GLUCOSE-CAPILLARY: 153 mg/dL — AB (ref 65–99)
GLUCOSE-CAPILLARY: 207 mg/dL — AB (ref 65–99)
GLUCOSE-CAPILLARY: 389 mg/dL — AB (ref 65–99)
GLUCOSE-CAPILLARY: 310 mg/dL — AB (ref 65–99)
Glucose-Capillary: 300 mg/dL — ABNORMAL HIGH (ref 65–99)
Glucose-Capillary: 515 mg/dL (ref 65–99)

## 2016-06-25 MED ORDER — ENOXAPARIN (LOVENOX) PATIENT EDUCATION KIT
PACK | Freq: Once | Status: AC
  Administered 2016-06-25: 13:00:00
  Filled 2016-06-25: qty 1

## 2016-06-25 MED ORDER — DEXAMETHASONE SODIUM PHOSPHATE 100 MG/10ML IJ SOLN
10.0000 mg | Freq: Once | INTRAMUSCULAR | Status: AC
  Administered 2016-06-25: 10 mg via INTRAVENOUS
  Filled 2016-06-25: qty 1

## 2016-06-25 MED ORDER — SODIUM CHLORIDE 0.9 % IV SOLN
500.0000 mg/m2 | Freq: Once | INTRAVENOUS | Status: AC
  Administered 2016-06-25: 850 mg via INTRAVENOUS
  Filled 2016-06-25: qty 34

## 2016-06-25 MED ORDER — CARBOPLATIN CHEMO INJECTION 600 MG/60ML
452.0000 mg | Freq: Once | INTRAVENOUS | Status: AC
  Administered 2016-06-25: 15:00:00 450 mg via INTRAVENOUS
  Filled 2016-06-25: qty 45

## 2016-06-25 MED ORDER — SODIUM CHLORIDE 0.9 % IV SOLN
Freq: Once | INTRAVENOUS | Status: AC
  Administered 2016-06-25: 13:00:00 via INTRAVENOUS

## 2016-06-25 MED ORDER — ASPIRIN 81 MG PO TBEC: 81.0000 mg | DELAYED_RELEASE_TABLET | Freq: Every day | ORAL | Status: DC

## 2016-06-25 MED ORDER — POLYVINYL ALCOHOL 1.4 % OP SOLN
1.0000 [drp] | OPHTHALMIC | Status: DC | PRN
  Administered 2016-06-25: 1 [drp] via OPHTHALMIC
  Filled 2016-06-25: qty 15

## 2016-06-25 MED ORDER — PALONOSETRON HCL INJECTION 0.25 MG/5ML
0.2500 mg | Freq: Once | INTRAVENOUS | Status: AC
  Administered 2016-06-25: 0.25 mg via INTRAVENOUS
  Filled 2016-06-25: qty 5

## 2016-06-25 MED ORDER — ENOXAPARIN SODIUM 80 MG/0.8ML ~~LOC~~ SOLN: 65.0000 mg | Freq: Two times a day (BID) | SUBCUTANEOUS | 0 refills | Status: DC

## 2016-06-25 MED ORDER — DIPHENHYDRAMINE HCL 25 MG PO CAPS
25.0000 mg | ORAL_CAPSULE | Freq: Three times a day (TID) | ORAL | Status: DC | PRN
  Administered 2016-06-25: 25 mg via ORAL
  Filled 2016-06-25: qty 1

## 2016-06-25 NOTE — Progress Notes (Signed)
[place] made initial visit with patient in room 113. Pt was pleasant but it was not a good time for a visit. [place] with check with Pt on next rounding.    06/25/16 1600  Clinical Encounter Type  Visited With Patient;Patient and family together  Visit Type Initial;Follow-up;Spiritual support

## 2016-06-25 NOTE — Progress Notes (Signed)
PT Cancellation Note  Patient Details Name: Melvin Willis MRN: [id number] DOB: [birth year]   Cancelled Treatment:    Reason Eval/Treat Not Completed: Patient at procedure or test/unavailable (Nursing at bedside for initiation of chemotherapy.  Will re-attempt at later time as patient available.)    [name] H. Owens Shark, PT, DPT, NCS 06/25/16, 1:55 PM [phone]

## 2016-06-25 NOTE — Progress Notes (Signed)
[facility] [place] NAME: Melvin Willis    MR#:  [id number]  DATE OF BIRTH:  1950/10/18  SUBJECTIVE:  CHIEF COMPLAINT:   Chief Complaint  Patient presents with  . DVT  . Numbness   Left weakness is better  REVIEW OF SYSTEMS:    Review of Systems  Constitutional: Negative for chills and fever.  HENT: Negative for sore throat.   Eyes: Negative for blurred vision, double vision and pain.  Respiratory: Negative for cough, hemoptysis, shortness of breath and wheezing.   Cardiovascular: Negative for chest pain, palpitations, orthopnea and leg swelling.  Gastrointestinal: Negative for abdominal pain, constipation, diarrhea, heartburn, nausea and vomiting.  Genitourinary: Negative for dysuria and hematuria.  Musculoskeletal: Negative for back pain and joint pain.  Skin: Negative for rash.  Neurological: Positive for focal weakness. Negative for sensory change, speech change and headaches.  Endo/Heme/Allergies: Does not bruise/bleed easily.  Psychiatric/Behavioral: Negative for depression. The patient is not nervous/anxious.     DRUG ALLERGIES:  No Known Allergies  VITALS:  Blood pressure 125/74, pulse (!) 101, temperature 97.8 F (36.6 C), temperature source Oral, resp. rate 20, height '5\' 4"'$  (1.626 m), weight 63.8 kg (140 lb 11.2 oz), SpO2 99 %.  PHYSICAL EXAMINATION:   Physical Exam  GENERAL:  65 y.o.-year-old patient lying in the bed with no acute distress.  EYES: Pupils equal, round, reactive to light and accommodation. No scleral icterus. Extraocular muscles intact.  HEENT: Head atraumatic, normocephalic. Oropharynx and nasopharynx clear.  NECK:  Supple, no jugular venous distention. No thyroid enlargement, no tenderness.  LUNGS: Normal breath sounds bilaterally, no wheezing, rales, rhonchi. No use of accessory muscles of respiration.  CARDIOVASCULAR: S1, S2 normal. No murmurs, rubs, or gallops.  ABDOMEN: Soft, nontender,  nondistended. Bowel sounds present. No organomegaly or mass.  EXTREMITIES: No cyanosis, clubbing or edema b/l.    NEUROLOGIC: Cranial nerves II through XII are intact. No focal Motor or sensory deficits b/l.   PSYCHIATRIC: The patient is alert and oriented x 3.  SKIN: No obvious rash, lesion, or ulcer.   LABORATORY PANEL:   CBC  Recent Labs Lab 06/24/16 0632  WBC 8.4  HGB 12.4*  HCT 35.9*  PLT 293   ------------------------------------------------------------------------------------------------------------------ Chemistries   Recent Labs Lab 06/24/16 0632  NA 134*  K 4.6  CL 101  CO2 24  GLUCOSE 151*  BUN 16  CREATININE 1.07  CALCIUM 9.7  AST 25  ALT 41  ALKPHOS 77  BILITOT 0.8   ------------------------------------------------------------------------------------------------------------------  Cardiac Enzymes  Recent Labs Lab 06/24/16 2250  TROPONINI 2.75*   ------------------------------------------------------------------------------------------------------------------  RADIOLOGY:  Ct Head Wo Contrast  Result Date: 06/24/2016 CLINICAL DATA:  Slurred speech.  Left arm numbness.  Lung cancer. EXAM: CT HEAD WITHOUT CONTRAST TECHNIQUE: Contiguous axial images were obtained from the base of the skull through the vertex without intravenous contrast. COMPARISON:  CT head 06/17/2016, MRI head 07/2016 FINDINGS: Brain: New areas of wedge-shaped hypodensity in the parietal lobe bilaterally. This involves cortex and was not present previously. This is most consistent with subacute infarction. No associated hemorrhage. Small hypodensity right cerebellum most consistent with chronic infarction as noted on prior MRI. No acute hemorrhage. Ventricle size normal. No shift of the midline structures. No other areas of acute infarct. Vascular: No hyperdense vessel or unexpected calcification. Skull: Negative Sinuses/Orbits: Negative Other: None IMPRESSION: New areas of wedge-shaped  hypodensity in the parietal lobe bilaterally involving cortex most consistent with subacute infarction. In this  patient with lung cancer, this is most consistent with infarction rather than metastatic disease, as these findings were not present 1 week ago. Patienthad multiple small areas of embolic infarction on the prior MRI. Current parietal infarcts are also consistent with emboli. No associated hemorrhage. Electronically Signed   By: Franchot Gallo M.D.   On: 06/24/2016 07:11     ASSESSMENT AND PLAN:   65 year old African-American gentleman history of metastatic lung cancer, DVT, CVA currently on Xarelto presented with left-sided weakness  1. Bilateral acute CVA Likely embolic with patient's hypercoagulable state. Switch Xarelto to Lovenox. Start baby aspirin. Recent echo and carotids showed nothing acute. Discussed with Dr. Doy Mince of neurology  2. Hyperlipidemia unspecified: Statin therapy 3. Pulmonary embolism Xarelto changed to Lovenox 4. Type 2 diabetes non-insulin-requiring sliding scale coverage 5. Essential hypertension: Lisinopril  6. Lung cancer with metastases Patient to get chemotherapy today monitor overnight and discharge home tomorrow. Discussed with Dr. Erick Blinks Monday of oncology.   All the records are reviewed and case discussed with Care Management/Social Workerr. Management plans discussed with the patient, family and they are in agreement.  CODE STATUS: FULL CODE  DVT Prophylaxis: SCDs  TOTAL TIME TAKING CARE OF THIS PATIENT: 30 minutes.   POSSIBLE D/C IN 1-2 DAYS, DEPENDING ON CLINICAL CONDITION.  Hillary Bow R M.D on 06/25/2016 at 1:35 PM  Between 7am to 6pm - Pager - [phone]  After 6pm go to [email or web address] - password [email or web address] [facility]  Office  [phone]  CC: Primary care physician; Keith Rake, MD  Note: This dictation was prepared with Dragon dictation along with smaller phrase technology. Any transcriptional  errors that result from this process are unintentional.

## 2016-06-25 NOTE — Discharge Instructions (Addendum)
Resume diet and activity as before  Enoxaparin (Lovenox)  - Wash and dry hands - Sit or lie in a comfortable position so you can see your abdomen.  - Choose an area on the right or left of your abdomen at least 2 inches from your belly button.  - Clean injection site with an alcohol swab. Let dry. - Remove the needle cap by pulling it straight off the syringe and discard it in the sharps collector.  - Hold the syringe like a pencil in your writing hand.  - With other hand, pinch an inch of the cleaned area to make a fold in the skin. Insert the full length of the needle straight down at a 90 degree angle into the fold of skin.  - Press the plunger with your thumb until the syringe is empty.  - Pull the needle straight out at the same angle that it was inserted, and release the skin fold.  - Point needle down away from yourself and others, and push down on the plunger to activate the safety shield.  - Place the used syringe in the sharps collector.

## 2016-06-25 NOTE — Progress Notes (Signed)
Melvin Willis   DOB:1951-05-14   TG#:[id number]    Subjective: Patient is tingling and numbness is improved. Denies any focal deficit this time. Patient had chemotherapy this afternoon. No nausea no vomiting. Appetite is good.   Objective:  Vitals:   06/25/16 0910 06/25/16 1412  BP: 125/74 117/68  Pulse: (!) 101 95  Resp:    Temp:  98.8 F (37.1 C)     Intake/Output Summary (Last 24 hours) at 06/25/16 1847 Last data filed at 06/25/16 1512  Gross per 24 hour  Intake           1014.4 ml  Output                0 ml  Net           1014.4 ml    GENERAL Alert, no distress and comfortable. Accompanied by family. EYES: no pallor or icterus OROPHARYNX: no thrush or ulceration. NECK: supple, no masses felt LYMPH:  no palpable lymphadenopathy in the cervical, axillary or inguinal regions LUNGS: decreased breath sounds to auscultation at bases and  No wheeze or crackles HEART/CVS: regular rate & rhythm and no murmurs; No lower extremity edema ABDOMEN: abdomen soft, tender  on deep palpation. and normal bowel sounds Musculoskeletal:no cyanosis of digits and no clubbing  PSYCH: alert & oriented x 3 with fluent speech NEURO: no focal motor/sensory deficits SKIN:  no rashes or significant lesions   Labs:  Lab Results  Component Value Date   WBC 8.4 06/24/2016   HGB 12.4 (L) 06/24/2016   HCT 35.9 (L) 06/24/2016   MCV 81.8 06/24/2016   PLT 293 06/24/2016   NEUTROABS 6.7 (H) 06/24/2016    Lab Results  Component Value Date   NA 134 (L) 06/24/2016   K 4.6 06/24/2016   CL 101 06/24/2016   CO2 24 06/24/2016    Studies:  Ct Head Wo Contrast  Result Date: 06/24/2016 CLINICAL DATA:  Slurred speech.  Left arm numbness.  Lung cancer. EXAM: CT HEAD WITHOUT CONTRAST TECHNIQUE: Contiguous axial images were obtained from the base of the skull through the vertex without intravenous contrast. COMPARISON:  CT head 06/17/2016, MRI head 07/2016 FINDINGS: Brain: New areas of wedge-shaped  hypodensity in the parietal lobe bilaterally. This involves cortex and was not present previously. This is most consistent with subacute infarction. No associated hemorrhage. Small hypodensity right cerebellum most consistent with chronic infarction as noted on prior MRI. No acute hemorrhage. Ventricle size normal. No shift of the midline structures. No other areas of acute infarct. Vascular: No hyperdense vessel or unexpected calcification. Skull: Negative Sinuses/Orbits: Negative Other: None IMPRESSION: New areas of wedge-shaped hypodensity in the parietal lobe bilaterally involving cortex most consistent with subacute infarction. In this patient with lung cancer, this is most consistent with infarction rather than metastatic disease, as these findings were not present 1 week ago. Patienthad multiple small areas of embolic infarction on the prior MRI. Current parietal infarcts are also consistent with emboli. No associated hemorrhage. Electronically Signed   By: Franchot Gallo M.D.   On: 06/24/2016 07:11    Assessment & Plan:   # 65 year old male patient with recently diagnosed metastatic adenocarcinoma the lung; and recent acute stroke on Xarelto is currently admitted to the hospital for new onset strokelike symptoms/ also evident on CT scan.  # Metastatic adenocarcinoma the lung- ROS-1- status post first cycle of carboplatin and Alimta in the hospital. Patient tolerated chemotherapy well. We will be likely discharged home tomorrow on  antiemetics-Zofran; Phenergan. We will reinitiate prescription for Xalkori.   # Strokes [recent DVT/PE] question embolic recent 2-D echo negative; Recommend Lovenox 1 mg/kg twice a day; along with aspirin. Discontinue Xarelto. Agree the patient is at high risk for bleeding. Discussed with Dr.Reynolds yesterday.   #The above plan of care was discussed with the patient/family in detail. They agree. Also discussed with Dr.Sudini.   Cammie Sickle, MD 06/25/2016   6:47 PM

## 2016-06-25 NOTE — Progress Notes (Signed)
PT Cancellation Note  Patient Details Name: Melvin Willis MRN: [id number] DOB: Feb 05, 1951   Cancelled Treatment:    Reason Eval/Treat Not Completed: PT screened, no needs identified, will sign off (Consult received and chart reviewed.  Per discussion with primary RN, patient and family, patient's initial symptoms have resolved and patient has returned to baseline.  Has been up indep in room, taken shower (standing) without difficulty or safety concern; no PT needs identified.  Encouraged staff, patient, family to reconsult should needs change.)   Vijay Durflinger H. Owens Shark, PT, DPT, NCS 06/25/16, 2:16 PM [phone]

## 2016-06-26 ENCOUNTER — Telehealth: Payer: Self-pay | Admitting: *Deleted

## 2016-06-26 ENCOUNTER — Other Ambulatory Visit: Payer: Self-pay | Admitting: Internal Medicine

## 2016-06-26 ENCOUNTER — Telehealth: Payer: Self-pay | Admitting: Family Medicine

## 2016-06-26 DIAGNOSIS — Z86718 Personal history of other venous thrombosis and embolism: Secondary | ICD-10-CM

## 2016-06-26 LAB — GLUCOSE, CAPILLARY
Glucose-Capillary: 203 mg/dL — ABNORMAL HIGH (ref 65–99)
Glucose-Capillary: 353 mg/dL — ABNORMAL HIGH (ref 65–99)

## 2016-06-26 LAB — HEMOGLOBIN A1C
HEMOGLOBIN A1C: 6.6 % — AB (ref 4.8–5.6)
Mean Plasma Glucose: 143 mg/dL

## 2016-06-26 LAB — POCT CBG MONITORING: POCT Glucose (KUC): 203 mg/dL — AB (ref 70–99)

## 2016-06-26 MED ORDER — ONDANSETRON HCL 8 MG PO TABS: 8.0000 mg | ORAL_TABLET | Freq: Three times a day (TID) | ORAL | 1 refills | Status: DC | PRN

## 2016-06-26 MED ORDER — PROCHLORPERAZINE MALEATE 10 MG PO TABS: 10.0000 mg | ORAL_TABLET | Freq: Four times a day (QID) | ORAL | 1 refills | Status: DC | PRN

## 2016-06-26 NOTE — Telephone Encounter (Signed)
Requesting refill on Reli-on Ultra Testing Strips. Please send to [facility]. Pt just got out of the hospital and need to keep check on his sugar levels.

## 2016-06-26 NOTE — Assessment & Plan Note (Signed)
As per treatment team, he will receive Crizotinb tomorrow Therefore chemo therapy initially planned for today has been cancelled I reviewed the potential side effets with Crizotinib including diarrhea, Sob, rashes etc,detailed teaching was performed earlier.

## 2016-06-26 NOTE — Progress Notes (Signed)
Inpatient Diabetes Program Recommendations  AACE/ADA: New Consensus Statement on Inpatient Glycemic Control (2015)  Target Ranges:  Prepandial:   less than 140 mg/dL      Peak postprandial:   less than 180 mg/dL (1-2 hours)      Critically ill patients:  140 - 180 mg/dL   Lab Results  Component Value Date   GLUCAP 353 (H) 06/26/2016   HGBA1C 6.6 (H) 06/25/2016    Review of Glycemic Control  Results for Melvin Willis, Melvin Willis (MRN [id number]) as of 06/26/2016 13:33  Ref. Range 06/25/2016 20:21 06/25/2016 20:22 06/25/2016 23:12 06/26/2016 07:57 06/26/2016 12:11  Glucose-Capillary Latest Ref Range: 65 - 99 mg/dL 515 (HH) 389 (H) 310 (H) 203 (H) 353 (H)    Diabetes history: Type 2 Outpatient Diabetes medications: Glucotrol 5 mg bid, Metformin '500mg'$  bid Current orders for Inpatient glycemic control: Novolog 0-9 units tid, Novolog 0-5 units qhs  Inpatient Diabetes Program Recommendations: Please consider adding Lantus 6 units qhs.  Gentry Fitz, RN, BA, MHA, CDE Diabetes Coordinator Inpatient Diabetes Program  [phone] (Team Pager) [phone] ([place]) 06/26/2016 1:38 PM

## 2016-06-26 NOTE — Discharge Summary (Signed)
[facility] NAME: Melvin Willis    MR#:  [id number]  DATE OF BIRTH:  [birth year]  DATE OF ADMISSION:  06/24/2016 ADMITTING PHYSICIAN: Lytle Butte, MD  DATE OF DISCHARGE: 06/26/2016  1:39 PM  PRIMARY CARE PHYSICIAN: Keith Rake, MD   ADMISSION DIAGNOSIS:  Paresthesia [R20.2] Elevated troponin [R74.8] Cerebrovascular accident (CVA), unspecified mechanism ([place]) [I63.9]  DISCHARGE DIAGNOSIS:  Principal Problem:   Acute cerebral infarction ([place]) Active Problems:   DVT (deep venous thrombosis) (H[facility])   PE (pulmonary thromboembolism) ([place])   Primary malignant neoplasm of lung metastatic to other site [facility])   Elevated troponin   CVA (cerebral vascular accident) ([place])   SECONDARY DIAGNOSIS:   Past Medical History:  Diagnosis Date  . Abnormal prostate specific antigen 08/08/2012  . Adiposity 04/16/2015  . CVA (cerebral vascular accident) ([place]) 06/17/2016  . Diabetes mellitus without complication ([place])   . Diverticulosis of sigmoid colon 04/16/2015  . Dyslipidemia 03/18/2015  . Hemorrhoids, internal 04/16/2015  . Hypercholesteremia 04/16/2015  . Hyperlipidemia   . Hypertension   . Primary cancer of left upper lobe of lung ([place])   . Pulmonary embolism ([place])   . Wears dentures    partial upper     ADMITTING HISTORY  HISTORY OF PRESENT ILLNESS:  Melvin Willis  is a 65 y.o. male with a known history of Metastatic lung cancer as well as a DVT already on Xarelto treatment, incidental embolic CVA noted on MRI who is just discharged from the hospital one week ago presenting with left-sided hand weakness and slurred speech. Onset of symptoms 4 AM morning of admission, no symptoms completely resolved. No TPA given because of severity of symptoms as well as already on Xarelto. He was discharged by myself last admission having incidental findings on MRI for embolic CVA, these findings were consistent with being diagnosed with blood clot  and starting on Xarelto a few weeks before hand. Now, however, given new symptoms we will admit    HOSPITAL COURSE:   * Acute bilateral embolic CVA Patient was admitted onto medical floor with telemetry monitoring. Neuro checks. Recent echocardiogram and carotid Dopplers reviewed. CT scan of the head showed multiple bilateral CVA. Seen by neurology. Patient's symptoms resolved on day 2 of admission. Did not need any physical therapy at discharge. Patient is being placed on Lovenox and should've Xarelto along with baby aspirin after discussing with neurology and oncology.  * History of DVT. Xarelto changed to Lovenox.  * Lung cancer Follow-up with oncology as outpatient.  Stable for discharge home.  CONSULTS OBTAINED:  Treatment Team:  Lytle Butte, MD Catarina Hartshorn, MD Alexis Goodell, MD Cammie Sickle, MD  DRUG ALLERGIES:  No Known Allergies  DISCHARGE MEDICATIONS:   Discharge Medication List as of 06/26/2016 11:07 AM    START taking these medications   Details  aspirin EC 81 MG EC tablet Take 1 tablet (81 mg total) by mouth daily., Starting Fri 06/26/2016, No Print    enoxaparin (LOVENOX) 80 MG/0.8ML injection Inject 0.65 mLs (65 mg total) into the skin every 12 (twelve) hours., Starting Thu 06/25/2016, Print      CONTINUE these medications which have NOT CHANGED   Details  atorvastatin (LIPITOR) 40 MG tablet Take 40 mg by mouth daily., Historical Med    finasteride (PROSCAR) 5 MG tablet Take 5 mg by mouth at bedtime. , Starting Mon 03/04/2015, Historical Med    glipiZIDE (GLUCOTROL) 5 MG tablet Take 1  tablet (5 mg total) by mouth 2 (two) times daily before a meal., Starting Fri 04/03/2016, Normal    lisinopril (PRINIVIL,ZESTRIL) 10 MG tablet TAKE ONE TABLET BY MOUTH ONCE DAILY, Normal    metFORMIN (GLUCOPHAGE) 1000 MG tablet Take 0.5 tablets (500 mg total) by mouth 2 (two) times daily., Starting Fri 04/03/2016, Normal    tamsulosin (FLOMAX) 0.4 MG CAPS  capsule Take 0.4 mg by mouth every morning. , Starting Thu 01/24/2015, Historical Med    crizotinib (XALKORI) 250 MG capsule Take 1 capsule (250 mg total) by mouth 2 (two) times daily., Starting Fri 06/19/2016, Print    ondansetron (ZOFRAN) 8 MG tablet Take 1 tablet (8 mg total) by mouth every 8 (eight) hours as needed for nausea or vomiting (start 3 days; after chemo)., Starting Fri 06/26/2016, Normal    prochlorperazine (COMPAZINE) 10 MG tablet Take 1 tablet (10 mg total) by mouth every 6 (six) hours as needed for nausea or vomiting., Starting Fri 06/26/2016, Normal      STOP taking these medications     XARELTO 15 MG TABS tablet         Today   VITAL SIGNS:  Blood pressure 110/66, pulse 85, temperature 98.6 F (37 C), resp. rate 18, height '5\' 4"'$  (1.626 m), weight 63.8 kg (140 lb 11.2 oz), SpO2 99 %.  I/O:   Intake/Output Summary (Last 24 hours) at 06/26/16 1447 Last data filed at 06/26/16 0533  Gross per 24 hour  Intake              292 ml  Output                0 ml  Net              292 ml    PHYSICAL EXAMINATION:  Physical Exam  GENERAL:  65 y.o.-year-old patient lying in the bed with no acute distress.  LUNGS: Normal breath sounds bilaterally, no wheezing, rales,rhonchi or crepitation. No use of accessory muscles of respiration.  CARDIOVASCULAR: S1, S2 normal. No murmurs, rubs, or gallops.  ABDOMEN: Soft, non-tender, non-distended. Bowel sounds present. No organomegaly or mass.  NEUROLOGIC: Moves all 4 extremities. PSYCHIATRIC: The patient is alert and oriented x 3.  SKIN: No obvious rash, lesion, or ulcer.   DATA REVIEW:   CBC  Recent Labs Lab 06/24/16 0632  WBC 8.4  HGB 12.4*  HCT 35.9*  PLT 293    Chemistries   Recent Labs Lab 06/24/16 0632  NA 134*  K 4.6  CL 101  CO2 24  GLUCOSE 151*  BUN 16  CREATININE 1.07  CALCIUM 9.7  AST 25  ALT 41  ALKPHOS 77  BILITOT 0.8    Cardiac Enzymes  Recent Labs Lab 06/24/16 2250  TROPONINI 2.75*     Microbiology Results  No results found for this or any previous visit.  RADIOLOGY:  No results found.  Follow up with PCP in 1 week.  Management plans discussed with the patient, family and they are in agreement.  CODE STATUS:     Code Status Orders        Start     Ordered   06/24/16 0832  Full code  Continuous     06/24/16 0832    Code Status History    Date Active Date Inactive Code Status Order ID Comments User Context   06/17/2016  9:01 PM 06/18/2016  4:16 PM Full Code [id number]  Henreitta Leber, MD Inpatient   06/03/2016  9:17 PM 06/05/2016  8:04 AM Full Code [id number]  Vaughan Basta, MD Inpatient      TOTAL TIME TAKING CARE OF THIS PATIENT ON DAY OF DISCHARGE: more than 30 minutes.   Hillary Bow R M.D on 06/26/2016 at 2:47 PM  Between 7am to 6pm - Pager - [phone]  After 6pm go to [email or web address] - password [email or web address] [facility]  Office  [phone]  CC: Primary care physician; Keith Rake, MD  Note: This dictation was prepared with Dragon dictation along with smaller phrase technology. Any transcriptional errors that result from this process are unintentional.

## 2016-06-26 NOTE — Assessment & Plan Note (Signed)
He complains of blurred vision in both  eyes, sometimes sees double vision, no black out of viision, No painful eye movements On exam he does have bilateral cataracts, no nystagmus A recent brain imaging showed no brain metastasis No headaches, nausea or vomting I will request an opthalmology evaluaion

## 2016-06-26 NOTE — Care Management (Signed)
Telephone call to [facility] on [address]. They don't have enough Lovenox prescription for Mr Loren.  Telephone call to [facility] on [address]. They have 55 syringes.  Christella Scheuermann will only pay for a 5 day supply of Lovenox at a time. Pharmacy would like the family to call their insurance company and discuss Lovenox supply. Also, to come back to [facility] when another 5 day supply is needed. Co-Pay will be $10.00.  Mr. Safley updated Jeannie Fend RN MSN CCM Care Management [phone]

## 2016-06-26 NOTE — Progress Notes (Signed)
ANTICOAGULATION CONSULT NOTE - Initial Consult  Pharmacy Consult for Lovenox Indication: pulmonary embolus  No Known Allergies  Patient Measurements: Height: '5\' 4"'$  (162.6 cm) Weight: 140 lb 11.2 oz (63.8 kg) IBW/kg (Calculated) : 59.2 Heparin Dosing Weight: 63.8 ml/min  Vital Signs: Temp: 98.1 F (36.7 C) (10/20 0436) Temp Source: Oral (10/20 0436) BP: 121/74 (10/20 0436) Pulse Rate: 83 (10/20 0436)  Labs:  Recent Labs  06/24/16 0632 06/24/16 1038 06/24/16 1627 06/24/16 2250  HGB 12.4*  --   --   --   HCT 35.9*  --   --   --   PLT 293  --   --   --   APTT 31  --   --   --   LABPROT 26.9*  --   --   --   INR 2.43  --   --   --   CREATININE 1.07  --   --   --   TROPONINI 0.73* 1.40* 2.73* 2.75*    Estimated Creatinine Clearance: 59.9 mL/min (by C-G formula based on SCr of 1.07 mg/dL).   Medical History: Past Medical History:  Diagnosis Date  . Abnormal prostate specific antigen 08/08/2012  . Adiposity 04/16/2015  . CVA (cerebral vascular accident) ([place]) 06/17/2016  . Diabetes mellitus without complication ([place])   . Diverticulosis of sigmoid colon 04/16/2015  . Dyslipidemia 03/18/2015  . Hemorrhoids, internal 04/16/2015  . Hypercholesteremia 04/16/2015  . Hyperlipidemia   . Hypertension   . Primary cancer of left upper lobe of lung ([place])   . Pulmonary embolism ([place])   . Wears dentures    partial upper    Medications:  Prescriptions Prior to Admission  Medication Sig Dispense Refill Last Dose  . atorvastatin (LIPITOR) 40 MG tablet Take 40 mg by mouth daily.   06/23/2016 at 0800  . finasteride (PROSCAR) 5 MG tablet Take 5 mg by mouth at bedtime.    06/23/2016 at 0730  . glipiZIDE (GLUCOTROL) 5 MG tablet Take 1 tablet (5 mg total) by mouth 2 (two) times daily before a meal. 180 tablet 0 06/23/2016 at 0730  . lisinopril (PRINIVIL,ZESTRIL) 10 MG tablet TAKE ONE TABLET BY MOUTH ONCE DAILY 90 tablet 0 06/23/2016 at 0730  . metFORMIN (GLUCOPHAGE) 1000 MG tablet Take 0.5  tablets (500 mg total) by mouth 2 (two) times daily. 90 tablet 0 06/23/2016 at 1860  . tamsulosin (FLOMAX) 0.4 MG CAPS capsule Take 0.4 mg by mouth every morning.    06/23/2016 at 0730  . XARELTO 15 MG TABS tablet Take 15 mg by mouth 2 (two) times daily.   06/23/2016 at 1830  . crizotinib (XALKORI) 250 MG capsule Take 1 capsule (250 mg total) by mouth 2 (two) times daily. (Patient not taking: Reported on 06/24/2016) 60 capsule 4 Not Taking    Assessment: CrCl = 59.9 ml/min   Goal of Therapy:  resolution of PE Monitor platelets by anticoagulation protocol: Yes   Plan:  Continue to Enoxaparin '65mg'$  SQ Q12 hours.  Patient has been counseled on self administration of enoxaparin (Lovenox). Daughter also participated in education and expressed understanding.   Nancy Fetter, PharmD Clinical Pharmacist 06/26/2016 8:55 AM

## 2016-06-26 NOTE — Progress Notes (Signed)
Melvin Willis   DOB:[birth year]   JJ#:[id number]    Subjective: Patient is tingling and numbness is improved. Denies any focal deficit this time. Patient had chemotherapy yesterday. No nausea no vomiting. Appetite is good.   Objective:  Vitals:   06/26/16 0106 06/26/16 0436  BP: 114/73 121/74  Pulse: 99 83  Resp: 20 18  Temp: 98.3 F (36.8 C) 98.1 F (36.7 C)     Intake/Output Summary (Last 24 hours) at 06/26/16 0800 Last data filed at 06/26/16 0533  Gross per 24 hour  Intake           1014.4 ml  Output                0 ml  Net           1014.4 ml    GENERAL Alert, no distress and comfortable. He is alone.  EYES: no pallor or icterus OROPHARYNX: no thrush or ulceration. NECK: supple, no masses felt LYMPH:  no palpable lymphadenopathy in the cervical, axillary or inguinal regions LUNGS: decreased breath sounds to auscultation at bases and  No wheeze or crackles HEART/CVS: regular rate & rhythm and no murmurs; No lower extremity edema ABDOMEN: abdomen soft, tender  on deep palpation. and normal bowel sounds Musculoskeletal:no cyanosis of digits and no clubbing  PSYCH: alert & oriented x 3 with fluent speech NEURO: no focal motor/sensory deficits SKIN:  no rashes or significant lesions   Labs:  Lab Results  Component Value Date   WBC 8.4 06/24/2016   HGB 12.4 (L) 06/24/2016   HCT 35.9 (L) 06/24/2016   MCV 81.8 06/24/2016   PLT 293 06/24/2016   NEUTROABS 6.7 (H) 06/24/2016    Lab Results  Component Value Date   NA 134 (L) 06/24/2016   K 4.6 06/24/2016   CL 101 06/24/2016   CO2 24 06/24/2016    Studies:  No results found.  Assessment & Plan:   # 64 year old male patient with recently diagnosed metastatic adenocarcinoma the lung; and recent acute stroke on Xarelto is currently admitted to the hospital for new onset strokelike symptoms/ also evident on CT scan.  # Metastatic adenocarcinoma the lung- ROS-1- status post first cycle of carboplatin and Alimta-  on 10/19th; Patient tolerated chemotherapy well. Sent prescription for antiemetics-Zofran; Phenergan to his pharmacy.   # Strokes [recent DVT/PE] question embolic recent 2-D echo negative; Recommend Lovenox 1 mg/kg twice a day; along with aspirin. Patient's family will give him the lovenox shots.    #The above plan of care was discussed with the patient in detail. They agree. Also discussed with Dr.Sudini. Follow up in cancer center in 1 week.    Cammie Sickle, MD 06/26/2016  8:00 AM

## 2016-06-26 NOTE — Telephone Encounter (Signed)
Called Melvin Willis to let her know her FMLA papers are ready.  Informed her they will be left at the registration desk on lower level.  Verbalized understanding.

## 2016-06-27 NOTE — Progress Notes (Signed)
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 He requested to be seen today for complaints of blurred vision in both  eyes, sometimes sees double vision, no black out of viision, No painful eye movements On exam he does have bilateral cataracts, no nystagmus A recent brain imaging showed no brain metastasis No headaches, nausea or vomting I will request an opthalmology evaluaion    REVIEW OF SYSTEMS:   ROS  As per HPI. Otherwise, a complete review of systems is negative.  PAST MEDICAL HISTORY: Past Medical History:  Diagnosis Date  . Abnormal prostate specific antigen 08/08/2012  . Adiposity 04/16/2015  . CVA (cerebral vascular accident) ([place]) 06/17/2016  . Diabetes mellitus without complication ([place])   . Diverticulosis of sigmoid colon 04/16/2015  . Dyslipidemia 03/18/2015  . Hemorrhoids, internal 04/16/2015  . Hypercholesteremia 04/16/2015  . Hyperlipidemia   . Hypertension   . Primary cancer of left upper lobe of lung ([place])   . Pulmonary embolism ([place])   . Wears dentures    partial upper    PAST SURGICAL HISTORY: Past Surgical History:  Procedure Laterality Date  . COLONOSCOPY    . COLONOSCOPY WITH PROPOFOL N/A 05/06/2015   Procedure: COLONOSCOPY WITH PROPOFOL;  Surgeon: Lucilla Lame, MD;  Location: [facility];  Service: Endoscopy;  Laterality: N/A;  ASCENDING COLON POLYPS X 2 TERMINAL ILEUM BIOPSY RANDOM COLON BX. TRANSVERSE COLON POLYP SIGMOID COLON POLYP  . ESOPHAGOGASTRODUODENOSCOPY (EGD) WITH PROPOFOL N/A 05/06/2015   Procedure: ESOPHAGOGASTRODUODENOSCOPY (EGD) WITH PROPOFOL;  Surgeon: Lucilla Lame, MD;  Location: [facility];   Service: Endoscopy;  Laterality: N/A;  GASTRIC BIOPSY X1    FAMILY HISTORY: Family History  Problem Relation Age of Onset  . Diabetes Mother   . Diabetes Father   . CAD Father   . Dementia Father   . Diabetes Sister   . Cancer Maternal Uncle     Prostate  . Cancer Cousin     prostate    ADVANCED DIRECTIVES (Y/N):  N  HEALTH MAINTENANCE: Social History  Substance Use Topics  . Smoking status: Never Smoker  . Smokeless tobacco: Never Used  . Alcohol use No     Colonoscopy:  PAP:  Bone density:  Lipid panel:  No Known Allergies  Current Outpatient Prescriptions  Medication Sig Dispense Refill  . aspirin EC 81 MG EC tablet Take 1 tablet (81 mg total) by mouth daily.    Marland Kitchen atorvastatin (LIPITOR) 40 MG tablet Take 40 mg by mouth daily.    . crizotinib (XALKORI) 250 MG capsule Take 1 capsule (250 mg total) by mouth 2 (two) times daily. (Patient not taking: Reported on 06/24/2016) 60 capsule 4  . enoxaparin (LOVENOX) 80 MG/0.8ML injection Inject 0.65 mLs (65 mg total) into the skin every 12 (twelve) hours. 60 Syringe 0  . finasteride (PROSCAR) 5 MG tablet Take 5 mg by mouth at bedtime.     Marland Kitchen glipiZIDE (GLUCOTROL) 5 MG tablet Take 1 tablet (5 mg total) by mouth 2 (two) times daily before a meal. 180 tablet 0  . lisinopril (PRINIVIL,ZESTRIL) 10 MG tablet TAKE ONE TABLET BY MOUTH ONCE DAILY 90 tablet 0  . metFORMIN (GLUCOPHAGE) 1000 MG tablet  Take 0.5 tablets (500 mg total) by mouth 2 (two) times daily. 90 tablet 0  . ondansetron (ZOFRAN) 8 MG tablet Take 1 tablet (8 mg total) by mouth every 8 (eight) hours as needed for nausea or vomiting (start 3 days; after chemo). 40 tablet 1  . prochlorperazine (COMPAZINE) 10 MG tablet Take 1 tablet (10 mg total) by mouth every 6 (six) hours as needed for nausea or vomiting. 40 tablet 1  . tamsulosin (FLOMAX) 0.4 MG CAPS capsule Take 0.4 mg by mouth every morning.      No current facility-administered medications for this visit.      OBJECTIVE: Vitals:   06/23/16 1216  BP: 137/90  Pulse: 96     There is no height or weight on file to calculate BMI.   There is no height or weight on file to calculate BSA.  ECOG FS:1 - Symptomatic but completely ambulatory  Physical Exam  Constitutional: He is oriented to person, place, and time. He appears well-developed and well-nourished.  HENT:  Head: Normocephalic and atraumatic.  Eyes: Conjunctivae and EOM are normal. Pupils are equal, round, and reactive to light.  Bilateral cataracts  Neck: Normal range of motion. Neck supple.  Cardiovascular: Normal rate and regular rhythm.   Pulmonary/Chest: Effort normal.  Abdominal: Soft.  Musculoskeletal: Normal range of motion. He exhibits no edema or deformity.  Neurological: He is alert and oriented to person, place, and time.  Psychiatric: He has a normal mood and affect. His behavior is normal. Judgment and thought content normal.      LAB RESULTS:  Lab Results  Component Value Date   NA 134 (L) 06/24/2016   K 4.6 06/24/2016   CL 101 06/24/2016   CO2 24 06/24/2016   GLUCOSE 151 (H) 06/24/2016   BUN 16 06/24/2016   CREATININE 1.07 06/24/2016   CALCIUM 9.7 06/24/2016   PROT 7.5 06/24/2016   ALBUMIN 3.8 06/24/2016   AST 25 06/24/2016   ALT 41 06/24/2016   ALKPHOS 77 06/24/2016   BILITOT 0.8 06/24/2016   GFRNONAA >60 06/24/2016   GFRAA >60 06/24/2016    Lab Results  Component Value Date   WBC 8.4 06/24/2016   NEUTROABS 6.7 (H) 06/24/2016   HGB 12.4 (L) 06/24/2016   HCT 35.9 (L) 06/24/2016   MCV 81.8 06/24/2016   PLT 293 06/24/2016     STUDIES: Ct Angio Head W Or Wo Contrast  Result Date: 06/17/2016 CLINICAL DATA:  Stroke on MRI earlier today. EXAM: CT ANGIOGRAPHY HEAD AND NECK TECHNIQUE: Multidetector CT imaging of the head and neck was performed using the standard protocol during bolus administration of intravenous contrast. Multiplanar CT image reconstructions and MIPs were obtained to evaluate the  vascular anatomy. Carotid stenosis measurements (when applicable) are obtained utilizing NASCET criteria, using the distal internal carotid diameter as the denominator. CONTRAST:  Study 5 cc Isovue 370 intravenous COMPARISON:  Brain MRI from earlier today FINDINGS: CT HEAD FINDINGS Brain: Known acute infarcts are not clearly visible. No hemorrhage, hydrocephalus, or shift. Vascular: No hyperdense vessel or unexpected calcification. Skull: 7 mm lucency in the lateral right orbital rim, which was described on brain MRI from earlier today. CT features favor a benign process. Sinuses: Right mastoiditis. Orbits: No acute finding Review of the MIP images confirms the above findings CTA NECK FINDINGS Aortic arch: Normal size and appearance.  Three vessel branching. Right carotid system: Mild atherosclerotic plaque at the carotid bifurcation without stenosis or dissection. No ulceration. Left carotid system: Mild atherosclerotic  calcification at the bifurcation. No stenosis, dissection, or ulceration. Vertebral arteries: No proximal subclavian artery stenosis. Codominant vertebral arteries are smooth and widely patent to the dura. Skeleton: No acute or aggressive finding in the neck. Other neck: Known adenopathy at the thoracic inlet and left supraclavicular fossa. Multi nodular goiter. Upper chest: Known left upper lobe malignancy with volume loss. No pulmonary emboli, partly seen on the left today. Review of the MIP images confirms the above findings CTA HEAD FINDINGS Anterior circulation: Symmetric carotid arteries. Symmetric branching. No major vessel occlusion, stenosis, or aneurysm. Posterior circulation:  Standard anatomy Venous sinuses: As permitted by contrast timing, patent. Anatomic variants: None significant Delayed phase: Posterior right frontal enhancement seen on brain MRI from earlier today may be subtly visible. Review of the MIP images confirms the above findings IMPRESSION: 1. Known multiple acute  infarcts by MRI. No major vessel occlusion, flow limiting stenosis, or visible arterial embolic source. In this patient with recent pulmonary embolism, recommend workup for PFO. 2. Mild atherosclerosis. 3. 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 Electronically Signed   By: Monte Fantasia M.D.   On: 06/17/2016 20:23   Ct Head Wo Contrast  Result Date: 06/24/2016 CLINICAL DATA:  Slurred speech.  Left arm numbness.  Lung cancer. EXAM: CT HEAD WITHOUT CONTRAST TECHNIQUE: Contiguous axial images were obtained from the base of the skull through the vertex without intravenous contrast. COMPARISON:  CT head 06/17/2016, MRI head 07/2016 FINDINGS: Brain: New areas of wedge-shaped hypodensity in the parietal lobe bilaterally. This involves cortex and was not present previously. This is most consistent with subacute infarction. No associated hemorrhage. Small hypodensity right cerebellum most consistent with chronic infarction as noted on prior MRI. No acute hemorrhage. Ventricle size normal. No shift of the midline structures. No other areas of acute infarct. Vascular: No hyperdense vessel or unexpected calcification. Skull: Negative Sinuses/Orbits: Negative Other: None IMPRESSION: New areas of wedge-shaped hypodensity in the parietal lobe bilaterally involving cortex most consistent with subacute infarction. In this patient with lung cancer, this is most consistent with infarction rather than metastatic disease, as these findings were not present 1 week ago. Patienthad multiple small areas of embolic infarction on the prior MRI. Current parietal infarcts are also consistent with emboli. No associated hemorrhage. Electronically Signed   By: Franchot Gallo M.D.   On: 06/24/2016 07:11   Ct Angio Neck W Or Wo Contrast  Result Date: 06/17/2016 CLINICAL DATA:  Stroke on MRI earlier today. EXAM: CT ANGIOGRAPHY HEAD AND NECK TECHNIQUE: Multidetector CT imaging of the head and neck  was performed using the standard protocol during bolus administration of intravenous contrast. Multiplanar CT image reconstructions and MIPs were obtained to evaluate the vascular anatomy. Carotid stenosis measurements (when applicable) are obtained utilizing NASCET criteria, using the distal internal carotid diameter as the denominator. CONTRAST:  Study 5 cc Isovue 370 intravenous COMPARISON:  Brain MRI from earlier today FINDINGS: CT HEAD FINDINGS Brain: Known acute infarcts are not clearly visible. No hemorrhage, hydrocephalus, or shift. Vascular: No hyperdense vessel or unexpected calcification. Skull: 7 mm lucency in the lateral right orbital rim, which was described on brain MRI from earlier today. CT features favor a benign process. Sinuses: Right mastoiditis. Orbits: No acute finding Review of the MIP images confirms the above findings CTA NECK FINDINGS Aortic arch: Normal size and appearance.  Three vessel branching. Right carotid system: Mild atherosclerotic plaque at the carotid bifurcation without stenosis or dissection. No ulceration. Left carotid system: Mild  atherosclerotic calcification at the bifurcation. No stenosis, dissection, or ulceration. Vertebral arteries: No proximal subclavian artery stenosis. Codominant vertebral arteries are smooth and widely patent to the dura. Skeleton: No acute or aggressive finding in the neck. Other neck: Known adenopathy at the thoracic inlet and left supraclavicular fossa. Multi nodular goiter. Upper chest: Known left upper lobe malignancy with volume loss. No pulmonary emboli, partly seen on the left today. Review of the MIP images confirms the above findings CTA HEAD FINDINGS Anterior circulation: Symmetric carotid arteries. Symmetric branching. No major vessel occlusion, stenosis, or aneurysm. Posterior circulation:  Standard anatomy Venous sinuses: As permitted by contrast timing, patent. Anatomic variants: None significant Delayed phase: Posterior right  frontal enhancement seen on brain MRI from earlier today may be subtly visible. Review of the MIP images confirms the above findings IMPRESSION: 1. Known multiple acute infarcts by MRI. No major vessel occlusion, flow limiting stenosis, or visible arterial embolic source. In this patient with recent pulmonary embolism, recommend workup for PFO. 2. Mild atherosclerosis. 3. 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 Electronically Signed   By: Monte Fantasia M.D.   On: 06/17/2016 20:23   Ct Angio Chest W/cm &/or Wo Cm  Result Date: 06/03/2016 CLINICAL DATA:  Severe left chest pain radiating down arm since this morning. Shortness of breath and fatigue since lithotripsy approximately 1 month ago. EXAM: CT ANGIOGRAPHY CHEST WITH CONTRAST TECHNIQUE: Multidetector CT imaging of the chest was performed using the standard protocol during bolus administration of intravenous contrast. Multiplanar CT image reconstructions and MIPs were obtained to evaluate the vascular anatomy. CONTRAST:  100 cc Isovue 370 COMPARISON:  None. FINDINGS: Cardiovascular: Multiple pulmonary emboli within the central segmental pulmonary arteries to the bilateral lower lobes, right greater than left. Several additional small pulmonary emboli within the central segmental pulmonary artery branches to the right upper lobe. No evidence of associated right heart failure. Heart size is normal. No pericardial effusion. Thoracic aorta is normal in caliber. No aortic dissection. Mediastinum/Nodes: Numerous small and enlarged lymph nodes within the mediastinum, largest lymph nodes within the anterior mediastinum. Representative lymph node within the anterior mediastinum measures 1.9 cm short axis dimension (series 5, image 33). Additional conglomerate lymphadenopathy noted within the precarinal space measuring approximately 3.6 x 2.1 cm. Conglomerate subcarinal lymphadenopathy measures approximately 3.2 x 2.4 cm.  Additional suspicious lymph nodes are seen in the right upper paratracheal region (series 5, images 14 through 23). Several mildly prominent supraclavicular lymph nodes are seen bilaterally, largest on the right measuring 1.1 cm short axis dimension (series 5, image 10). Esophagus and trachea are unremarkable. Lungs/Pleura: Dense consolidation is seen within the anterior segment of the left upper lobe, favored to represent airspace collapse secondary to a central obstructing mass. The presumed central obstructing mass is not clearly demarcated. The bronchus to the anterior segment of the left upper lobe terminates at the proximal margin of the consolidation. Lungs otherwise clear.  No pleural effusion. Upper Abdomen: Hypodense masses are seen within the bilateral liver lobes, largest of which is in the left liver lobe demonstrating CT density measurements compatible with benign cysts (2 abutting cysts with largest component measuring 3 cm greatest dimension. A smaller lesion in the right liver lobe measures 1.6 x 1.6 cm, too small to definitively characterize. Limited images of the upper abdomen are otherwise unremarkable. Musculoskeletal: No acute or suspicious osseous finding. Superficial soft tissues are unremarkable. Review of the MIP images confirms the above findings. IMPRESSION: 1. Bilateral small pulmonary  emboli within the central segmental pulmonary arteries to the right lower lobe, right upper lobe and left lower lobe. No evidence of associated right heart failure. 2. Large dense consolidation within the anterior segment of the left upper lobe, almost certainly postobstructive airspace collapse related to a central obstructing mass. The presumed central obstructing mass, however, is not clearly demarcated but is likely at the level of the acutely occluded left upper lobe bronchus. There is additional obstruction of the adjacent left upper lobe pulmonary vein. 3. Fairly extensive mediastinal  lymphadenopathy, with measurements given above. Additional mildly prominent lymph nodes within the supraclavicular regions are also suspicious for metastatic lymphadenopathy. 4. Hypodense masses within each liver lobe, largest located within the left liver lobe has the appearance of 2 abutting simple cysts based on CT density measurements. The lesion within the right liver lobe is too small to definitively characterize and requires further characterization with MRI to exclude metastasis. Critical Value/emergent results were called by telephone at the time of interpretation on 06/03/2016 at 3:30 pm to Dr. Dossie [facility] , who verbally acknowledged these results. Electronically Signed   By: Franki Cabot M.D.   On: 06/03/2016 15:35   Mr Jeri Cos DV Contrast  Result Date: 06/17/2016 CLINICAL DATA:  Lung cancer staging. EXAM: MRI HEAD WITHOUT AND WITH CONTRAST TECHNIQUE: Multiplanar, multiecho pulse sequences of the brain and surrounding structures were obtained without and with intravenous contrast. CONTRAST:  15m MULTIHANCE GADOBENATE DIMEGLUMINE 529 MG/ML IV SOLN COMPARISON:  None. FINDINGS: Brain: Numerous sub cm foci of restricted diffusion throughout the bilateral cerebellum and along the bilateral cerebral convexities, anterior and posterior circulation. No hemorrhagic changes within these areas. There are 2 areas of sub cm cortical enhancement in the right frontal parietal cortex which on sagittal acquisition have a flat morphology. Favor subacute enhancing infarcts over metastases. Will need short-term follow-up scan. No hydrocephalus. Vascular: Normal flow voids. Skull and upper cervical spine: 9 mm area of T2 hyperintense and T1 hypo intense, enhancing signal abnormality in the lateral rim right orbit. Sinuses/Orbits: Right mastoid effusion with negative nasopharynx. Other: These results were called by telephone at the time of interpretation on 06/17/2016 at 2:42 pm to Dr. GCharlaine Dalton, who verbally  acknowledged these results. IMPRESSION: 1. Numerous small acute infarcts in multiple vascular distributions. In this patient with recent pulmonary embolism a PFO workup is recommended. 2. 2 subcentimeter foci of cortically based enhancement in the right cerebral hemisphere, favor subacute enhancing infarcts. Recommend follow-up scan in 2-3 months to exclude metastasis from patient's lung cancer. 3. 9 mm bone lesion in the right lateral orbital rim, metastasis versus developmental inclusion cyst (which occur in this location). Attention on follow-up. 4. Right mastoid effusion with negative nasopharynx. Electronically Signed   By: JMonte FantasiaM.D.   On: 06/17/2016 14:44   Ct Abdomen Pelvis W Contrast  Result Date: 06/04/2016 CLINICAL DATA:  A recent chest CT demonstrated pulmonary emboli eye and concern for malignancy. Possible liver lesion on chest CT. EXAM: CT ABDOMEN AND PELVIS WITH CONTRAST TECHNIQUE: Multidetector CT imaging of the abdomen and pelvis was performed using the standard protocol following bolus administration of intravenous contrast. CONTRAST:  1033mISOVUE-300 IOPAMIDOL (ISOVUE-300) INJECTION 61% COMPARISON:  Chest CTA 06/03/2016 FINDINGS: Lower chest: Lung bases are clear without pleural fluid. A few of the known pulmonary emboli are visualized at the lung bases. Hepatobiliary: There are low-density structures scattered throughout the liver. The largest structures are water attenuation and compatible with cysts. The largest is a  bilobed or slightly complex cyst near the left hepatic dome measuring up to 4.4 cm. Some of the smaller structures are too small to definitively characterize. Portal venous system is patent. Normal appearance of the gallbladder. No biliary dilatation. Pancreas: Focal fat near the junction of pancreatic head and uncinate process. Otherwise, normal appearance of the pancreas. Spleen: Peripheral low-density structure along the superior spleen measures roughly 2.2 cm.  This could be related to a splenic infarct. Remainder of the spleen has a normal appearance. Adrenals/Urinary Tract: Indeterminate 1.2 cm nodule in the left adrenal gland. Questionable nodule along the right adrenal gland medial limb. 1.5 cm cortical cyst in left kidney upper pole. No hydronephrosis. No suspicious renal lesions. Bladder is distended and the top the bladder extends up to the umbilicus. Stomach/Bowel: The stomach is unremarkable. Colonic diverticulosis without acute colonic inflammation. Normal appendix. Mild wall thickening of the distal stomach is probably within normal limits. Vascular/Lymphatic: Aortic atherosclerosis without aneurysm. Incidentally, there is a circumaortic left renal vein. Enlarged lymph nodes in the lower chest around the descending thoracic aorta and esophagus. Index lymph node on sequence 2, image 12 measures 1.4 cm. Enlarged lymph node in the left external iliac chain on sequence 2, image 62 measures 2.4 x 1.9 cm. Reproductive: Prostate is massively enlarged measuring 7.5 x 7.6 x 7.6 cm. In addition, the prostate is very nodular with large nodular components extending into the bladder base and a large nodular component along the right posterior aspect of the prostate. Other: No free fluid.  Small umbilical hernia containing fat. Musculoskeletal: Heterogeneity in the right iliac bone near the right SI joint is nonspecific. Disc space narrowing with endplate disease at M4-B5. IMPRESSION: Enlarged lymph nodes in the lower chest are compatible with metastatic disease seen on the prior chest CT. In addition, there is an indeterminate 1.2 cm left adrenal nodule which could represent metastatic disease. Question a small right adrenal lesion. Enlarged lymph node in the the left pelvis could also represent a metastatic lesion. Recommend further characterization of the lymphadenopathy and presumed metastatic disease with a PET-CT. Numerous hypodense structures throughout the liver. The  largest of these lesions are compatible with cysts. Massive enlargement of the prostate with diffuse nodularity. This could represent benign prostatic hypertrophy and recommend correlation with PSA level. Urinary bladder is distended and suspect there is a component of bladder outlet obstruction associated with this prostate hypertrophy. Wedge-shaped defect in the spleen. This could represent a small splenic infarct but indeterminate. Known pulmonary emboli. Electronically Signed   By: Markus Daft M.D.   On: 06/04/2016 16:02   US Biopsy  Result Date: 06/05/2016 CLINICAL DATA:  Anterior left lung mass. Mediastinal and bilateral supraclavicular adenopathy. Liver lesions. EXAM: ULTRASOUND GUIDED CORE BIOPSY OF LEFT SUPRACLAVICULAR ADENOPATHY MEDICATIONS: Lidocaine 1% subcutaneous PROCEDURE: The procedure, risks, benefits, and alternatives were explained to the patient. Questions regarding the procedure were encouraged and answered. The patient understands and consents to the procedure. Survey ultrasound of the left supraclavicular region was performed. The pathologic adenopathy was localized and an appropriate skin entry site determined and marked. The operative field was prepped with chlorhexidine in a sterile fashion, and a sterile drape was applied covering the operative field. A sterile gown and sterile gloves were used for the procedure. Local anesthesia was provided with 1% Lidocaine. Under real-time ultrasound guidance, a 17 gauge trocar needle was advanced to the margin of the lesion. Once needle tip position was confirmed, coaxial 18-gauge core biopsy samples were obtained, submitted in  saline to surgical pathology. The guide needle was removed. The patient tolerated the procedure well. COMPLICATIONS: None. FINDINGS: Left supraclavicular adenopathy was confirmed. Core biopsy samples obtained under ultrasound guidance as above. IMPRESSION: 1. Technically successful ultrasound-guided core biopsy of left  supraclavicular pathologic adenopathy. Electronically Signed   By: Lucrezia Europe M.D.   On: 06/05/2016 16:08   US Venous Img Lower Unilateral Left  Result Date: 06/03/2016 CLINICAL DATA:  Subacute onset of left lower extremity pain. Known pulmonary embolus. Initial encounter. EXAM: LEFT LOWER EXTREMITY VENOUS DOPPLER ULTRASOUND TECHNIQUE: Gray-scale sonography with graded compression, as well as color Doppler and duplex ultrasound were performed to evaluate the lower extremity deep venous systems from the level of the common femoral vein and including the common femoral, femoral, profunda femoral, popliteal and calf veins including the posterior tibial, peroneal and gastrocnemius veins when visible. The superficial great saphenous vein was also interrogated. Spectral Doppler was utilized to evaluate flow at rest and with distal augmentation maneuvers in the common femoral, femoral and popliteal veins. COMPARISON:  None. FINDINGS: Contralateral Common Femoral Vein: Respiratory phasicity is normal and symmetric with the symptomatic side. No evidence of thrombus. Normal compressibility. Common Femoral Vein: No evidence of thrombus. Normal compressibility, respiratory phasicity and response to augmentation. Saphenofemoral Junction: No evidence of thrombus. Normal compressibility and flow on color Doppler imaging. Profunda Femoral Vein: No evidence of thrombus. Normal compressibility and flow on color Doppler imaging. Femoral Vein: No evidence of thrombus. Normal compressibility, respiratory phasicity and response to augmentation. Popliteal Vein: No evidence of thrombus. Normal compressibility, respiratory phasicity and response to augmentation. Calf Veins: Occlusive thrombus is noted within one of the patient's left posterior tibial veins. The remaining visualized calf veins are unremarkable. Superficial Great Saphenous Vein: No evidence of thrombus. Normal compressibility and flow on color Doppler imaging. Venous  Reflux:  None. Other Findings:  None. IMPRESSION: Occlusive deep venous thrombus noted within one of the patient's posterior tibial veins. These results were called by telephone at the time of interpretation on 06/03/2016 at 6:16 pm to Dr. Harvest Dark, who verbally acknowledged these results. Electronically Signed   By: Garald Balding M.D.   On: 06/03/2016 18:19    ASSESSMENT:   Primary malignant neoplasm of lung metastatic to other site [facility]) As per treatment team, he will receive Crizotinb tomorrow Therefore chemo therapy initially planned for today has been cancelled I reviewed the potential side effets with Crizotinib including diarrhea, Sob, rashes etc,detailed teaching was performed earlier.     Blurred vision, bilateral He complains of blurred vision in both  eyes, sometimes sees double vision, no black out of viision, No painful eye movements On exam he does have bilateral cataracts, no nystagmus A recent brain imaging showed no brain metastasis No headaches, nausea or vomting I will request an opthalmology evaluaion    PLAN:    Patient expressed understanding and was in agreement with this plan. He also understands that He can call clinic at any time with any questions, concerns, or complaints.  Orders Placed This Encounter  Procedures  . Ambulatory referral to Ophthalmology    Referral Priority:   Urgent    Referral Type:   Consultation    Referral Reason:   Specialty Services Required    Requested Specialty:   Ophthalmology    Number of Visits Requested:   1    No Follow-up on file. No matching staging information was found for the patient.  Creola Corn, MD   06/27/2016 1:32 PM

## 2016-06-30 ENCOUNTER — Telehealth: Payer: Self-pay

## 2016-06-30 NOTE — Telephone Encounter (Signed)
Called and spoke with pt regarding his appt for Wednesday.  Dr. B is out sick and per Dr. B we could reschedule pt's appt for next week and restart process to get Crizotinib.  I reached out to [employer] and asked for process to restart to obtain medication.  [employer] is reinitiating the process and will call us back with any concerns or questions.  They needed no further information at this time per representative.

## 2016-07-01 ENCOUNTER — Ambulatory Visit (INDEPENDENT_AMBULATORY_CARE_PROVIDER_SITE_OTHER): Payer: Managed Care, Other (non HMO) | Admitting: Family Medicine

## 2016-07-01 ENCOUNTER — Encounter: Payer: Self-pay | Admitting: Family Medicine

## 2016-07-01 ENCOUNTER — Inpatient Hospital Stay: Payer: Managed Care, Other (non HMO) | Admitting: Internal Medicine

## 2016-07-01 VITALS — BP 112/70 | HR 110 | Temp 98.5°F | Resp 16 | Ht 64.0 in | Wt 139.8 lb

## 2016-07-01 DIAGNOSIS — I6349 Cerebral infarction due to embolism of other cerebral artery: Secondary | ICD-10-CM | POA: Diagnosis not present

## 2016-07-01 DIAGNOSIS — C3412 Malignant neoplasm of upper lobe, left bronchus or lung: Secondary | ICD-10-CM

## 2016-07-01 DIAGNOSIS — E785 Hyperlipidemia, unspecified: Secondary | ICD-10-CM | POA: Diagnosis not present

## 2016-07-01 DIAGNOSIS — E119 Type 2 diabetes mellitus without complications: Secondary | ICD-10-CM | POA: Diagnosis not present

## 2016-07-01 MED ORDER — GLIPIZIDE 5 MG PO TABS: 5.0000 mg | ORAL_TABLET | Freq: Two times a day (BID) | ORAL | 0 refills | Status: DC

## 2016-07-01 MED ORDER — METFORMIN HCL 1000 MG PO TABS: 500.0000 mg | ORAL_TABLET | Freq: Two times a day (BID) | ORAL | 0 refills | Status: DC

## 2016-07-01 NOTE — Progress Notes (Signed)
Name: Melvin Willis   MRN: [id number]    DOB: [birth year]   Date:07/01/2016       Progress Note  Subjective  Chief Complaint  Chief Complaint  Patient presents with  . Hospitalization Follow-up    HPI  Pt. Presents for Hospital follow up. He was last seen on 06/03/16 for complaints of dyspnea, a CT chest revealed bilateral Pulmonary emboli, consolidation in LUL, and mediastinal adenopathy. He was sent to ER and was admitted, diagnosed with metastatic lung cancer, hypercoagulable state (secondary to malignancy) with multiple acute infarcts on Brain MRI. He was initially started on Xarelto but later changed to Lovenox during his most recent hospitalization.  He is getting Chemo Rx by Oncology, I have recommended follow up with Neurologist.  Recent hospitalization, ER and consult notes reviewed and discussed with patient and spouse in detail Past Medical History:  Diagnosis Date  . Abnormal prostate specific antigen 08/08/2012  . Adiposity 04/16/2015  . CVA (cerebral vascular accident) ([place]) 06/17/2016  . Diabetes mellitus without complication ([place])   . Diverticulosis of sigmoid colon 04/16/2015  . Dyslipidemia 03/18/2015  . Hemorrhoids, internal 04/16/2015  . Hypercholesteremia 04/16/2015  . Hyperlipidemia   . Hypertension   . Primary cancer of left upper lobe of lung ([place])   . Pulmonary embolism ([place])   . Wears dentures    partial upper    Past Surgical History:  Procedure Laterality Date  . COLONOSCOPY    . COLONOSCOPY WITH PROPOFOL N/A 05/06/2015   Procedure: COLONOSCOPY WITH PROPOFOL;  Surgeon: Lucilla Lame, MD;  Location: [facility];  Service: Endoscopy;  Laterality: N/A;  ASCENDING COLON POLYPS X 2 TERMINAL ILEUM BIOPSY RANDOM COLON BX. TRANSVERSE COLON POLYP SIGMOID COLON POLYP  . ESOPHAGOGASTRODUODENOSCOPY (EGD) WITH PROPOFOL N/A 05/06/2015   Procedure: ESOPHAGOGASTRODUODENOSCOPY (EGD) WITH PROPOFOL;  Surgeon: Lucilla Lame, MD;  Location: [facility];   Service: Endoscopy;  Laterality: N/A;  GASTRIC BIOPSY X1    Family History  Problem Relation Age of Onset  . Diabetes Mother   . Diabetes Father   . CAD Father   . Dementia Father   . Diabetes Sister   . Cancer Maternal Uncle     Prostate  . Cancer Cousin     prostate    Social History   Social History  . Marital status: Married    Spouse name: N/A  . Number of children: N/A  . Years of education: N/A   Occupational History  . Not on file.   Social History Main Topics  . Smoking status: Never Smoker  . Smokeless tobacco: Never Used  . Alcohol use No  . Drug use: No  . Sexual activity: Yes    Partners: Female   Other Topics Concern  . Not on file   Social History Narrative  . No narrative on file     Current Outpatient Prescriptions:  .  aspirin EC 81 MG EC tablet, Take 1 tablet (81 mg total) by mouth daily., Disp: , Rfl:  .  atorvastatin (LIPITOR) 40 MG tablet, Take 40 mg by mouth daily., Disp: , Rfl:  .  crizotinib (XALKORI) 250 MG capsule, Take 1 capsule (250 mg total) by mouth 2 (two) times daily., Disp: 60 capsule, Rfl: 4 .  enoxaparin (LOVENOX) 80 MG/0.8ML injection, Inject 0.65 mLs (65 mg total) into the skin every 12 (twelve) hours., Disp: 60 Syringe, Rfl: 0 .  finasteride (PROSCAR) 5 MG tablet, Take 5 mg by mouth at bedtime. , Disp: , Rfl:  .  glipiZIDE (GLUCOTROL) 5 MG tablet, Take 1 tablet (5 mg total) by mouth 2 (two) times daily before a meal., Disp: 180 tablet, Rfl: 0 .  lisinopril (PRINIVIL,ZESTRIL) 10 MG tablet, TAKE ONE TABLET BY MOUTH ONCE DAILY, Disp: 90 tablet, Rfl: 0 .  metFORMIN (GLUCOPHAGE) 1000 MG tablet, Take 0.5 tablets (500 mg total) by mouth 2 (two) times daily., Disp: 90 tablet, Rfl: 0 .  ondansetron (ZOFRAN) 8 MG tablet, Take 1 tablet (8 mg total) by mouth every 8 (eight) hours as needed for nausea or vomiting (start 3 days; after chemo)., Disp: 40 tablet, Rfl: 1 .  prochlorperazine (COMPAZINE) 10 MG tablet, Take 1 tablet (10 mg total)  by mouth every 6 (six) hours as needed for nausea or vomiting., Disp: 40 tablet, Rfl: 1 .  tamsulosin (FLOMAX) 0.4 MG CAPS capsule, Take 0.4 mg by mouth every morning. , Disp: , Rfl:   No Known Allergies   Review of Systems  Constitutional: Positive for malaise/fatigue. Negative for chills and fever.  Eyes: Positive for blurred vision (distant vision appears blurred sometimes).  Respiratory: Positive for cough and shortness of breath. Negative for hemoptysis and sputum production.   Cardiovascular: Negative for chest pain.  Neurological: Negative for headaches.      Objective  Vitals:   07/01/16 1005  BP: 112/70  Pulse: (!) 110  Resp: 16  Temp: 98.5 F (36.9 C)  TempSrc: Oral  SpO2: 98%  Weight: 139 lb 12.8 oz (63.4 kg)  Height: '5\' 4"'$  (1.626 m)    Physical Exam  Constitutional: He is oriented to person, place, and time and well-developed, well-nourished, and in no distress.  HENT:  Head: Normocephalic and atraumatic.  Cardiovascular: Normal rate, regular rhythm and normal heart sounds.   No murmur heard. Pulmonary/Chest: Effort normal and breath sounds normal. He has no wheezes.  Abdominal: Soft. Bowel sounds are normal. There is no tenderness.  Neurological: He is alert and oriented to person, place, and time.  Psychiatric: Mood, memory, affect and judgment normal.  Nursing note and vitals reviewed.     Assessment & Plan  1. Primary cancer of left upper lobe of lung ([place]) HEENT followed by oncology, started chemotherapy  2. 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  3. Dyslipidemia Lipid panel ordered during hospitalization reviewed, normal total cholesterol, triglycerides and LDL cholesterol  4. Type 2 diabetes mellitus without complication, without long-term current use of insulin (HCC) A1c ordered during hospitalization  reviewed, 6.6% and at goal. No change in pharmacotherapy - metFORMIN (GLUCOPHAGE) 1000 MG tablet; Take 0.5 tablets (500 mg total) by mouth 2 (two) times daily.  Dispense: 90 tablet; Refill: 0 - glipiZIDE (GLUCOTROL) 5 MG tablet; Take 1 tablet (5 mg total) by mouth 2 (two) times daily before a meal.  Dispense: 180 tablet; Refill: 0  Gwendolyne Welford [facility] 07/01/2016 10:22 AM

## 2016-07-02 ENCOUNTER — Ambulatory Visit: Payer: Managed Care, Other (non HMO) | Admitting: Family Medicine

## 2016-07-02 ENCOUNTER — Other Ambulatory Visit: Payer: Self-pay | Admitting: *Deleted

## 2016-07-02 NOTE — Patient Outreach (Addendum)
[facility]) Care Management  07/02/2016  Melvin Willis Aug 10, 1951 [id number]   Subjective: Telephone call to patient's home number, spoke with patient, and HIPAA verified.   Discussed [facility] Care Management [employer] Transition of care follow up, patient voices understanding, and is in agreement to complete follow up. Patient states he is doing well and does not have any deficits from the strokes.  Has follow up appointments scheduled with oncologist on 07/08/16, pulmonologist on 07/13/16, and states he or wife will call to schedule neurologist appointment.   Patient states he does not have any transition of care, care coordination, disease management, disease monitoring, transportation, community resource, or pharmacy needs at this time.  He is in agreement to receive [place] Management information.   States he is very appreciative of the follow up call and will call RNCM if any transition of care needs arise in the future.   Objective: Per chart review: Patient hospitalized  06/24/16 -06/26/16 for  Acute cerebral infarction, DVT (deep venous thrombosis),   PE (pulmonary thromboembolism), Primary malignant neoplasm of lung metastatic to other site, and CVA (cerebral vascular accident).     Patient hospitalized 06/17/16 -06/18/16 for CVA (cerebral vascular accident).    Patient also has a history of diabetes, hypertension, and hyperlipidemia.  Patient seen by inpatient diabetes educator on 06/26/16.     Assessment: Received [employer] Transition of care referral on 07/02/16 via [place] discharge list.   Transition of care follow up completed, no additional transition of care needs at this time, and will proceed with case closure.  Plan: RNCM will send patient successful outreach letter, [facility] pamphlet, and magnet. RNCM will send case closure due to follow up completed / no care management needs request to [facility].  Sadonna Kotara H. Annia Friendly, BSN, [place] Management [facility] Telephonic CM Phone: [phone] Fax: [phone]

## 2016-07-03 ENCOUNTER — Telehealth: Payer: Self-pay | Admitting: *Deleted

## 2016-07-03 ENCOUNTER — Encounter: Payer: Self-pay | Admitting: *Deleted

## 2016-07-03 ENCOUNTER — Inpatient Hospital Stay: Payer: Managed Care, Other (non HMO)

## 2016-07-03 NOTE — Telephone Encounter (Signed)
Called to report that he received his medicine today. Advised patient to wait until next week and call back Monday to inquire about when to start med

## 2016-07-08 ENCOUNTER — Inpatient Hospital Stay: Payer: Managed Care, Other (non HMO)

## 2016-07-08 ENCOUNTER — Inpatient Hospital Stay: Payer: Managed Care, Other (non HMO) | Attending: Internal Medicine | Admitting: Internal Medicine

## 2016-07-08 VITALS — BP 114/75 | HR 92 | Temp 98.4°F | Resp 18 | Wt 143.0 lb

## 2016-07-08 DIAGNOSIS — E042 Nontoxic multinodular goiter: Secondary | ICD-10-CM | POA: Diagnosis not present

## 2016-07-08 DIAGNOSIS — E785 Hyperlipidemia, unspecified: Secondary | ICD-10-CM

## 2016-07-08 DIAGNOSIS — N179 Acute kidney failure, unspecified: Secondary | ICD-10-CM | POA: Diagnosis not present

## 2016-07-08 DIAGNOSIS — Z7982 Long term (current) use of aspirin: Secondary | ICD-10-CM | POA: Insufficient documentation

## 2016-07-08 DIAGNOSIS — C778 Secondary and unspecified malignant neoplasm of lymph nodes of multiple regions: Secondary | ICD-10-CM | POA: Diagnosis not present

## 2016-07-08 DIAGNOSIS — E7800 Pure hypercholesterolemia, unspecified: Secondary | ICD-10-CM | POA: Diagnosis not present

## 2016-07-08 DIAGNOSIS — Z86718 Personal history of other venous thrombosis and embolism: Secondary | ICD-10-CM | POA: Insufficient documentation

## 2016-07-08 DIAGNOSIS — Z8673 Personal history of transient ischemic attack (TIA), and cerebral infarction without residual deficits: Secondary | ICD-10-CM | POA: Diagnosis not present

## 2016-07-08 DIAGNOSIS — K5730 Diverticulosis of large intestine without perforation or abscess without bleeding: Secondary | ICD-10-CM

## 2016-07-08 DIAGNOSIS — Z86711 Personal history of pulmonary embolism: Secondary | ICD-10-CM | POA: Insufficient documentation

## 2016-07-08 DIAGNOSIS — I2510 Atherosclerotic heart disease of native coronary artery without angina pectoris: Secondary | ICD-10-CM | POA: Insufficient documentation

## 2016-07-08 DIAGNOSIS — R634 Abnormal weight loss: Secondary | ICD-10-CM | POA: Diagnosis not present

## 2016-07-08 DIAGNOSIS — C3412 Malignant neoplasm of upper lobe, left bronchus or lung: Secondary | ICD-10-CM | POA: Diagnosis present

## 2016-07-08 DIAGNOSIS — Z7901 Long term (current) use of anticoagulants: Secondary | ICD-10-CM | POA: Insufficient documentation

## 2016-07-08 DIAGNOSIS — R590 Localized enlarged lymph nodes: Secondary | ICD-10-CM | POA: Insufficient documentation

## 2016-07-08 DIAGNOSIS — E119 Type 2 diabetes mellitus without complications: Secondary | ICD-10-CM | POA: Diagnosis not present

## 2016-07-08 DIAGNOSIS — R110 Nausea: Secondary | ICD-10-CM | POA: Diagnosis not present

## 2016-07-08 DIAGNOSIS — Z8042 Family history of malignant neoplasm of prostate: Secondary | ICD-10-CM | POA: Insufficient documentation

## 2016-07-08 DIAGNOSIS — Z7984 Long term (current) use of oral hypoglycemic drugs: Secondary | ICD-10-CM | POA: Insufficient documentation

## 2016-07-08 DIAGNOSIS — H7091 Unspecified mastoiditis, right ear: Secondary | ICD-10-CM | POA: Insufficient documentation

## 2016-07-08 DIAGNOSIS — Z79899 Other long term (current) drug therapy: Secondary | ICD-10-CM | POA: Insufficient documentation

## 2016-07-08 DIAGNOSIS — R972 Elevated prostate specific antigen [PSA]: Secondary | ICD-10-CM | POA: Insufficient documentation

## 2016-07-08 DIAGNOSIS — R42 Dizziness and giddiness: Secondary | ICD-10-CM | POA: Insufficient documentation

## 2016-07-08 LAB — CBC WITH DIFFERENTIAL/PLATELET
BASOS ABS: 0 10*3/uL (ref 0–0.1)
Basophils Relative: 0 %
EOS ABS: 0.1 10*3/uL (ref 0–0.7)
EOS PCT: 3 %
HCT: 32.9 % — ABNORMAL LOW (ref 40.0–52.0)
Hemoglobin: 11.2 g/dL — ABNORMAL LOW (ref 13.0–18.0)
LYMPHS ABS: 1 10*3/uL (ref 1.0–3.6)
Lymphocytes Relative: 42 %
MCH: 28.2 pg (ref 26.0–34.0)
MCHC: 34.1 g/dL (ref 32.0–36.0)
MCV: 82.6 fL (ref 80.0–100.0)
MONO ABS: 0.7 10*3/uL (ref 0.2–1.0)
Monocytes Relative: 28 %
Neutro Abs: 0.7 10*3/uL — ABNORMAL LOW (ref 1.4–6.5)
Neutrophils Relative %: 27 %
PLATELETS: 195 10*3/uL (ref 150–440)
RBC: 3.98 MIL/uL — AB (ref 4.40–5.90)
RDW: 14.3 % (ref 11.5–14.5)
WBC: 2.4 10*3/uL — AB (ref 3.8–10.6)

## 2016-07-08 LAB — COMPREHENSIVE METABOLIC PANEL
ALT: 154 U/L — AB (ref 17–63)
AST: 60 U/L — ABNORMAL HIGH (ref 15–41)
Albumin: 3.8 g/dL (ref 3.5–5.0)
Alkaline Phosphatase: 76 U/L (ref 38–126)
Anion gap: 6 (ref 5–15)
BUN: 11 mg/dL (ref 6–20)
CHLORIDE: 104 mmol/L (ref 101–111)
CO2: 26 mmol/L (ref 22–32)
CREATININE: 1.12 mg/dL (ref 0.61–1.24)
Calcium: 9.6 mg/dL (ref 8.9–10.3)
GFR calc non Af Amer: >60 mL/min (ref >60)
Glucose, Bld: 157 mg/dL — ABNORMAL HIGH (ref 65–99)
POTASSIUM: 4.4 mmol/L (ref 3.5–5.1)
SODIUM: 136 mmol/L (ref 135–145)
Total Bilirubin: <0.1 mg/dL — ABNORMAL LOW (ref 0.3–1.2)
Total Protein: 7.3 g/dL (ref 6.5–8.1)

## 2016-07-08 NOTE — Progress Notes (Signed)
Patient  Is here for follow up,

## 2016-07-08 NOTE — Assessment & Plan Note (Signed)
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 S/p 1 cycle of carbo-alimta appx 10 days ago.  # Recommend Crizotinib 250 mg BID; discussed the potential side effects diarrhea; visual symptoms fatigue etc. Will instruct pt [id number] medication once labs are available.    # MRI brain- multiple strokes; no evidence of PFO on the 2-D echo bubble study. Continue Lovenox BID+ asprin 81 mg/day;   # Bilateral PE left lower extremity DVT-  As above.   #  Follow up in 3 weeks/labs.

## 2016-07-08 NOTE — Progress Notes (Signed)
Melvin Willis NOTE  Patient Care Team: Roselee Nova, MD as PCP - General (Family Medicine)  CHIEF COMPLAINTS/PURPOSE OF CONSULTATION:  Lung cancer  #  Oncology History   # OCT 2017- ADENO CA LUNG; STAGE IV [; LUL; bil supraclavicular LN; Left neck LN Bx]; ROS-1 MUTATED; s/p Carbo-alimta  x1[oct 2017]  # NOV 1st 2017- XALKORI 250 mg BID  # LLE DVT/bil PE/Multiple strokes [? On xarelto]-Lovenox  # s/p TURP [Sep 2017; Dr.Cope]; MRI brain- multiple infarcts [2d echo/bubble study-NEG's/p Neurology eval]   # MOLECULAR TESTING- ROS-1 POSITIVE; ALK/EGFR-NEG; PDL-1 EXPRESSION- 90% [HIGH]     Primary cancer of left upper lobe of lung ([place])   06/12/2016 Initial Diagnosis    Primary cancer of left upper lobe of lung ([place])       HISTORY OF PRESENTING ILLNESS:  Melvin Willis 65 y.o.  male with newly diagnosedMetastatic lung cancer ROS- positive; Multiple strokes secondary to his metastatic lung cancer- currently on Lovenox is here for follow-up.  Given the delay in starting his oral pills; and the fact the patient had worsening strokelike symptoms- patient received carboplatin and Alimta in the hospital; approximately 10 days ago.  Patient admits to intermittent dizzy spells especially on standing/bending. Otherwise denies any falls. Patient complains of nausea which is improved. Poor appetite since chemotherapy. Otherwise no vomiting no diarrhea no constipation. Denies any bleeding. Denies any worse and shortness of breath or cough.  ROS: A complete 10 point review of system is done which is negative except mentioned above in history of present illness  MEDICAL HISTORY:  Past Medical History:  Diagnosis Date  . Abnormal prostate specific antigen 08/08/2012  . Adiposity 04/16/2015  . CVA (cerebral vascular accident) ([place]) 06/17/2016  . Diabetes mellitus without complication ([place])   . Diverticulosis of sigmoid colon 04/16/2015  . Dyslipidemia 03/18/2015  .  Hemorrhoids, internal 04/16/2015  . Hypercholesteremia 04/16/2015  . Hyperlipidemia   . Hypertension   . Primary cancer of left upper lobe of lung ([place])   . Pulmonary embolism ([place])   . Wears dentures    partial upper    SURGICAL HISTORY: Past Surgical History:  Procedure Laterality Date  . COLONOSCOPY    . COLONOSCOPY WITH PROPOFOL N/A 05/06/2015   Procedure: COLONOSCOPY WITH PROPOFOL;  Surgeon: Lucilla Lame, MD;  Location: [facility];  Service: Endoscopy;  Laterality: N/A;  ASCENDING COLON POLYPS X 2 TERMINAL ILEUM BIOPSY RANDOM COLON BX. TRANSVERSE COLON POLYP SIGMOID COLON POLYP  . ESOPHAGOGASTRODUODENOSCOPY (EGD) WITH PROPOFOL N/A 05/06/2015   Procedure: ESOPHAGOGASTRODUODENOSCOPY (EGD) WITH PROPOFOL;  Surgeon: Lucilla Lame, MD;  Location: [facility];  Service: Endoscopy;  Laterality: N/A;  GASTRIC BIOPSY X1    SOCIAL HISTORY: Social History   Social History  . Marital status: Married    Spouse name: N/A  . Number of children: N/A  . Years of education: N/A   Occupational History  . Not on file.   Social History Main Topics  . Smoking status: Never Smoker  . Smokeless tobacco: Never Used  . Alcohol use No  . Drug use: No  . Sexual activity: Yes    Partners: Female   Other Topics Concern  . Not on file   Social History Narrative  . No narrative on file    FAMILY HISTORY: Family History  Problem Relation Age of Onset  . Diabetes Mother   . Diabetes Father   . CAD Father   . Dementia Father   .  Diabetes Sister   . Cancer Maternal Uncle     Prostate  . Cancer Cousin     prostate    ALLERGIES:  has No Known Allergies.  MEDICATIONS:  Current Outpatient Prescriptions  Medication Sig Dispense Refill  . aspirin EC 81 MG EC tablet Take 1 tablet (81 mg total) by mouth daily.    Marland Kitchen atorvastatin (LIPITOR) 40 MG tablet Take 40 mg by mouth daily.    . crizotinib (XALKORI) 250 MG capsule Take 1 capsule (250 mg total) by mouth 2 (two) times daily.  60 capsule 4  . enoxaparin (LOVENOX) 80 MG/0.8ML injection Inject 0.65 mLs (65 mg total) into the skin every 12 (twelve) hours. 60 Syringe 0  . finasteride (PROSCAR) 5 MG tablet Take 5 mg by mouth at bedtime.     Marland Kitchen glipiZIDE (GLUCOTROL) 5 MG tablet Take 1 tablet (5 mg total) by mouth 2 (two) times daily before a meal. 180 tablet 0  . lisinopril (PRINIVIL,ZESTRIL) 10 MG tablet TAKE ONE TABLET BY MOUTH ONCE DAILY 90 tablet 0  . metFORMIN (GLUCOPHAGE) 1000 MG tablet Take 0.5 tablets (500 mg total) by mouth 2 (two) times daily. 90 tablet 0  . ondansetron (ZOFRAN) 8 MG tablet Take 1 tablet (8 mg total) by mouth every 8 (eight) hours as needed for nausea or vomiting (start 3 days; after chemo). 40 tablet 1  . prochlorperazine (COMPAZINE) 10 MG tablet Take 1 tablet (10 mg total) by mouth every 6 (six) hours as needed for nausea or vomiting. 40 tablet 1  . tamsulosin (FLOMAX) 0.4 MG CAPS capsule Take 0.4 mg by mouth every morning.      No current facility-administered medications for this visit.       Marland Kitchen  PHYSICAL EXAMINATION: ECOG PERFORMANCE STATUS: 0 - Asymptomatic  Vitals:   07/08/16 1609  BP: 114/75  Pulse: 92  Resp: 18  Temp: 98.4 F (36.9 C)   Filed Weights   07/08/16 1609  Weight: 143 lb (64.9 kg)    GENERAL: Well-nourished well-developed; Alert, no distress and comfortable.   With his wife.  EYES: no pallor or icterus OROPHARYNX: no thrush or ulceration; good dentition  NECK: supple, no masses felt LYMPH:  no palpable lymphadenopathy in the cervical, axillary or inguinal regions LUNGS: clear to auscultation and  No wheeze or crackles HEART/CVS: regular rate & rhythm and no murmurs; No lower extremity edema ABDOMEN: abdomen soft, non-tender and normal bowel sounds Musculoskeletal:no cyanosis of digits and no clubbing  PSYCH: alert & oriented x 3 with fluent speech NEURO: no focal motor/sensory deficits SKIN:  no rashes or significant lesions  LABORATORY DATA:  I have  reviewed the data as listed Lab Results  Component Value Date   WBC 8.4 06/24/2016   HGB 12.4 (L) 06/24/2016   HCT 35.9 (L) 06/24/2016   MCV 81.8 06/24/2016   PLT 293 06/24/2016    Recent Labs  06/03/16 1630  06/17/16 1731 06/19/16 1110 06/24/16 0632  NA 135  < > 136 134* 134*  K 4.2  < > 4.5 4.3 4.6  CL 102  < > 103 99* 101  CO2 25  < > _0 GLUCOSE 126*  < > 210* 241* 151*  BUN 13  < > _1 CREATININE 1.23  < > 1.29* 1.22 1.07  CALCIUM 9.6  < > 9.9 9.7 9.7  GFRNONAA >60  < > 58* >60 >60  GFRAA >60  < > >60 >60 >60  PROT  8.1  --   --  8.0 7.5  ALBUMIN 4.2  --   --  4.0 3.8  AST 19  --   --  25 25  ALT 19  --   --  37 41  ALKPHOS 92  --   --  82 77  BILITOT 0.9  --   --  0.5 0.8  < > = values in this interval not displayed.  RADIOGRAPHIC STUDIES: I have personally reviewed the radiological images as listed and agreed with the findings in the report. Ct Angio Head W Or Wo Contrast  Result Date: 06/17/2016 CLINICAL DATA:  Stroke on MRI earlier today. EXAM: CT ANGIOGRAPHY HEAD AND NECK TECHNIQUE: Multidetector CT imaging of the head and neck was performed using the standard protocol during bolus administration of intravenous contrast. Multiplanar CT image reconstructions and MIPs were obtained to evaluate the vascular anatomy. Carotid stenosis measurements (when applicable) are obtained utilizing NASCET criteria, using the distal internal carotid diameter as the denominator. CONTRAST:  Study 5 cc Isovue 370 intravenous COMPARISON:  Brain MRI from earlier today FINDINGS: CT HEAD FINDINGS Brain: Known acute infarcts are not clearly visible. No hemorrhage, hydrocephalus, or shift. Vascular: No hyperdense vessel or unexpected calcification. Skull: 7 mm lucency in the lateral right orbital rim, which was described on brain MRI from earlier today. CT features favor a benign process. Sinuses: Right mastoiditis. Orbits: No acute finding Review of the MIP images confirms the  above findings CTA NECK FINDINGS Aortic arch: Normal size and appearance.  Three vessel branching. Right carotid system: Mild atherosclerotic plaque at the carotid bifurcation without stenosis or dissection. No ulceration. Left carotid system: Mild atherosclerotic calcification at the bifurcation. No stenosis, dissection, or ulceration. Vertebral arteries: No proximal subclavian artery stenosis. Codominant vertebral arteries are smooth and widely patent to the dura. Skeleton: No acute or aggressive finding in the neck. Other neck: Known adenopathy at the thoracic inlet and left supraclavicular fossa. Multi nodular goiter. Upper chest: Known left upper lobe malignancy with volume loss. No pulmonary emboli, partly seen on the left today. Review of the MIP images confirms the above findings CTA HEAD FINDINGS Anterior circulation: Symmetric carotid arteries. Symmetric branching. No major vessel occlusion, stenosis, or aneurysm. Posterior circulation:  Standard anatomy Venous sinuses: As permitted by contrast timing, patent. Anatomic variants: None significant Delayed phase: Posterior right frontal enhancement seen on brain MRI from earlier today may be subtly visible. Review of the MIP images confirms the above findings IMPRESSION: 1. Known multiple acute infarcts by MRI. No major vessel occlusion, flow limiting stenosis, or visible arterial embolic source. In this patient with recent pulmonary embolism, recommend workup for PFO. 2. Mild atherosclerosis. 3. 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 Electronically Signed   By: Monte Fantasia M.D.   On: 06/17/2016 20:23   Ct Head Wo Contrast  Result Date: 06/24/2016 CLINICAL DATA:  Slurred speech.  Left arm numbness.  Lung cancer. EXAM: CT HEAD WITHOUT CONTRAST TECHNIQUE: Contiguous axial images were obtained from the base of the skull through the vertex without intravenous contrast. COMPARISON:  CT head 06/17/2016, MRI head  07/2016 FINDINGS: Brain: New areas of wedge-shaped hypodensity in the parietal lobe bilaterally. This involves cortex and was not present previously. This is most consistent with subacute infarction. No associated hemorrhage. Small hypodensity right cerebellum most consistent with chronic infarction as noted on prior MRI. No acute hemorrhage. Ventricle size normal. No shift of the midline structures. No other areas of acute  infarct. Vascular: No hyperdense vessel or unexpected calcification. Skull: Negative Sinuses/Orbits: Negative Other: None IMPRESSION: New areas of wedge-shaped hypodensity in the parietal lobe bilaterally involving cortex most consistent with subacute infarction. In this patient with lung cancer, this is most consistent with infarction rather than metastatic disease, as these findings were not present 1 week ago. Patienthad multiple small areas of embolic infarction on the prior MRI. Current parietal infarcts are also consistent with emboli. No associated hemorrhage. Electronically Signed   By: Franchot Gallo M.D.   On: 06/24/2016 07:11   Ct Angio Neck W Or Wo Contrast  Result Date: 06/17/2016 CLINICAL DATA:  Stroke on MRI earlier today. EXAM: CT ANGIOGRAPHY HEAD AND NECK TECHNIQUE: Multidetector CT imaging of the head and neck was performed using the standard protocol during bolus administration of intravenous contrast. Multiplanar CT image reconstructions and MIPs were obtained to evaluate the vascular anatomy. Carotid stenosis measurements (when applicable) are obtained utilizing NASCET criteria, using the distal internal carotid diameter as the denominator. CONTRAST:  Study 5 cc Isovue 370 intravenous COMPARISON:  Brain MRI from earlier today FINDINGS: CT HEAD FINDINGS Brain: Known acute infarcts are not clearly visible. No hemorrhage, hydrocephalus, or shift. Vascular: No hyperdense vessel or unexpected calcification. Skull: 7 mm lucency in the lateral right orbital rim, which was  described on brain MRI from earlier today. CT features favor a benign process. Sinuses: Right mastoiditis. Orbits: No acute finding Review of the MIP images confirms the above findings CTA NECK FINDINGS Aortic arch: Normal size and appearance.  Three vessel branching. Right carotid system: Mild atherosclerotic plaque at the carotid bifurcation without stenosis or dissection. No ulceration. Left carotid system: Mild atherosclerotic calcification at the bifurcation. No stenosis, dissection, or ulceration. Vertebral arteries: No proximal subclavian artery stenosis. Codominant vertebral arteries are smooth and widely patent to the dura. Skeleton: No acute or aggressive finding in the neck. Other neck: Known adenopathy at the thoracic inlet and left supraclavicular fossa. Multi nodular goiter. Upper chest: Known left upper lobe malignancy with volume loss. No pulmonary emboli, partly seen on the left today. Review of the MIP images confirms the above findings CTA HEAD FINDINGS Anterior circulation: Symmetric carotid arteries. Symmetric branching. No major vessel occlusion, stenosis, or aneurysm. Posterior circulation:  Standard anatomy Venous sinuses: As permitted by contrast timing, patent. Anatomic variants: None significant Delayed phase: Posterior right frontal enhancement seen on brain MRI from earlier today may be subtly visible. Review of the MIP images confirms the above findings IMPRESSION: 1. Known multiple acute infarcts by MRI. No major vessel occlusion, flow limiting stenosis, or visible arterial embolic source. In this patient with recent pulmonary embolism, recommend workup for PFO. 2. Mild atherosclerosis. 3. 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 Electronically Signed   By: Monte Fantasia M.D.   On: 06/17/2016 20:23   Mr Jeri Cos KG Contrast  Result Date: 06/17/2016 CLINICAL DATA:  Lung cancer staging. EXAM: MRI HEAD WITHOUT AND WITH CONTRAST TECHNIQUE:  Multiplanar, multiecho pulse sequences of the brain and surrounding structures were obtained without and with intravenous contrast. CONTRAST:  31m MULTIHANCE GADOBENATE DIMEGLUMINE 529 MG/ML IV SOLN COMPARISON:  None. FINDINGS: Brain: Numerous sub cm foci of restricted diffusion throughout the bilateral cerebellum and along the bilateral cerebral convexities, anterior and posterior circulation. No hemorrhagic changes within these areas. There are 2 areas of sub cm cortical enhancement in the right frontal parietal cortex which on sagittal acquisition have a flat morphology. Favor subacute enhancing infarcts over metastases.  Will need short-term follow-up scan. No hydrocephalus. Vascular: Normal flow voids. Skull and upper cervical spine: 9 mm area of T2 hyperintense and T1 hypo intense, enhancing signal abnormality in the lateral rim right orbit. Sinuses/Orbits: Right mastoid effusion with negative nasopharynx. Other: These results were called by telephone at the time of interpretation on 06/17/2016 at 2:42 pm to Dr. Charlaine Dalton , who verbally acknowledged these results. IMPRESSION: 1. Numerous small acute infarcts in multiple vascular distributions. In this patient with recent pulmonary embolism a PFO workup is recommended. 2. 2 subcentimeter foci of cortically based enhancement in the right cerebral hemisphere, favor subacute enhancing infarcts. Recommend follow-up scan in 2-3 months to exclude metastasis from patient's lung cancer. 3. 9 mm bone lesion in the right lateral orbital rim, metastasis versus developmental inclusion cyst (which occur in this location). Attention on follow-up. 4. Right mastoid effusion with negative nasopharynx. Electronically Signed   By: Monte Fantasia M.D.   On: 06/17/2016 14:44    ASSESSMENT & PLAN:   Primary cancer of left upper lobe of lung ([place]) # Adenocarcinoma of the lung metastatic/stage IV- bilateral supraclavicular adenopathy; ROS-1 POSITIVE. S/p 1 cycle of  carbo-alimta appx 10 days ago.  # Recommend Crizotinib 250 mg BID; discussed the potential side effects diarrhea; visual symptoms fatigue etc. Will instruct pt JH:HIDUPBDH medication once labs are available.    # MRI brain- multiple strokes; no evidence of PFO on the 2-D echo bubble study. Continue Lovenox BID+ asprin 81 mg/day;   # Bilateral PE left lower extremity DVT-  As above.   #  Follow up in 3 weeks/labs.      Cammie Sickle, MD 07/08/2016 4:41 PM

## 2016-07-09 ENCOUNTER — Telehealth: Payer: Self-pay | Admitting: *Deleted

## 2016-07-09 DIAGNOSIS — C3412 Malignant neoplasm of upper lobe, left bronchus or lung: Secondary | ICD-10-CM

## 2016-07-09 NOTE — Telephone Encounter (Signed)
Contacted patient. Instructed pt to hold xalkori due to elevated LFTs.  Apt given for lab only on Monday 11/6 @ 1pm. Teach back process performed with patient.

## 2016-07-09 NOTE — Telephone Encounter (Signed)
-----   Message from Cammie Sickle, MD sent at 07/09/2016  8:57 AM EDT ----- Recommend holding starting xalkori today; repeat labs- cbc/cmp on next Monday 11/06/ 2017- ; and then will recommend starting medication- please inform pt- Thx

## 2016-07-12 ENCOUNTER — Other Ambulatory Visit: Payer: Self-pay | Admitting: Family Medicine

## 2016-07-12 DIAGNOSIS — E119 Type 2 diabetes mellitus without complications: Secondary | ICD-10-CM

## 2016-07-12 NOTE — Progress Notes (Signed)
[facility]     Assessment and Plan:  Adenocarcinoma of the lung metastatic/stage IV --bilateral supraclavicular adenopathy;  S/p 1 cycle of carbo-alimta appx 10 days ago. --Currently appears to be doing very well from respiratory standpoint without respiratory issues or dyspnea. Pt is asked to follow up with [place] on an as-needed basis with any further respiratory issues.    Multiple strokes --On Lovenox  Bilateral PE left lower extremity DVT --Continue lovenox bid.    Date: 07/12/2016  MRN# [id number] Melvin Willis Mar 04, 1951    Melvin Willis is a 65 y.o. old male seen in consultation for chief complaint of:    Chief Complaint  Patient presents with  . Hospitalization Follow-up    pt discharged on 06-26-16. pt states breathing is doing well. pt c/o non prod cough.    HPI:   He has no new complaints today, he notes that his breathing has been doing well since the bronchoscopy, and now appears back at baseline.   He was ambulated in the office today on RA with no significant dyspnea or discomfort and without oxygen desaturation.   He has been having no issued with lovenox injection, and no bleeding issues.   Medication:   Reviewed.   Allergies:  Patient has no known allergies.  Review of Systems: Gen:  Denies  fever, sweats, chills HEENT: Denies blurred vision, double vision. bleeds, sore throat Cvc:  No dizziness, chest pain. Resp:   Denies cough or sputum production, shortness of breath Gi: Denies swallowing difficulty, stomach pain. Gu:  Denies bladder incontinence, burning urine Ext:   No Joint pain, stiffness. Skin: No skin rash,  hives  Endoc:  No polyuria, polydipsia. Psych: No depression, insomnia. Other:  All other systems were reviewed with the patient and were negative other that what is mentioned in the HPI.   Physical Examination:   VS: BP 132/64 (BP Location: Left Arm, Cuff Size: Normal)   Pulse 90   Ht '5\' 4"'$   (1.626 m)   Wt 144 lb 9.6 oz (65.6 kg)   SpO2 99%   BMI 24.82 kg/m   General Appearance: No distress  Neuro:without focal findings,  speech normal,  HEENT: PERRLA, EOM intact.   Pulmonary: normal breath sounds, No wheezing.  CardiovascularNormal S1,S2.  No m/r/g.   Abdomen: Benign, Soft, non-tender. Renal:  No costovertebral tenderness  GU:  No performed at this time. Endoc: No evident thyromegaly, no signs of acromegaly. Skin:   warm, no rashes, no ecchymosis  Extremities: normal, no cyanosis, clubbing.  Other findings:    LABORATORY PANEL:   CBC  Recent Labs Lab 07/08/16 1635  WBC 2.4*  HGB 11.2*  HCT 32.9*  PLT 195   ------------------------------------------------------------------------------------------------------------------  Chemistries   Recent Labs Lab 07/08/16 1635  NA 136  K 4.4  CL 104  CO2 26  GLUCOSE 157*  BUN 11  CREATININE 1.12  CALCIUM 9.6  AST 60*  ALT 154*  ALKPHOS 76  BILITOT <0.1*   ------------------------------------------------------------------------------------------------------------------  Cardiac Enzymes No results for input(s): TROPONINI in the last 168 hours. ------------------------------------------------------------  RADIOLOGY:  No results found.     Thank  you for the consultation and for allowing [facility] to assist in the care of your patient. Our recommendations are noted above.  Please contact us if we can be of further service.   Marda Stalker, MD.  Board Certified in Internal Medicine, Pulmonary Medicine, [place], and Sleep Medicine.  [facility] Office Number: [phone]  Patricia Pesa, M.D.  Vilinda Boehringer, M.D.  Merton Border, M.D  07/12/2016

## 2016-07-13 ENCOUNTER — Inpatient Hospital Stay: Payer: Managed Care, Other (non HMO)

## 2016-07-13 ENCOUNTER — Encounter: Payer: Self-pay | Admitting: Internal Medicine

## 2016-07-13 ENCOUNTER — Ambulatory Visit (INDEPENDENT_AMBULATORY_CARE_PROVIDER_SITE_OTHER): Payer: Managed Care, Other (non HMO) | Admitting: Internal Medicine

## 2016-07-13 VITALS — BP 132/64 | HR 90 | Ht 64.0 in | Wt 144.6 lb

## 2016-07-13 DIAGNOSIS — C7800 Secondary malignant neoplasm of unspecified lung: Secondary | ICD-10-CM

## 2016-07-13 DIAGNOSIS — C3412 Malignant neoplasm of upper lobe, left bronchus or lung: Secondary | ICD-10-CM

## 2016-07-13 LAB — COMPREHENSIVE METABOLIC PANEL
ALBUMIN: 3.9 g/dL (ref 3.5–5.0)
ALK PHOS: 81 U/L (ref 38–126)
ALT: 143 U/L — AB (ref 17–63)
AST: 46 U/L — AB (ref 15–41)
Anion gap: 7 (ref 5–15)
BILIRUBIN TOTAL: 0.2 mg/dL — AB (ref 0.3–1.2)
BUN: 13 mg/dL (ref 6–20)
CALCIUM: 9.6 mg/dL (ref 8.9–10.3)
CO2: 25 mmol/L (ref 22–32)
Chloride: 101 mmol/L (ref 101–111)
Creatinine, Ser: 1.12 mg/dL (ref 0.61–1.24)
GFR calc Af Amer: >60 mL/min (ref >60)
GFR calc non Af Amer: >60 mL/min (ref >60)
GLUCOSE: 279 mg/dL — AB (ref 65–99)
POTASSIUM: 4.6 mmol/L (ref 3.5–5.1)
Sodium: 133 mmol/L — ABNORMAL LOW (ref 135–145)
TOTAL PROTEIN: 7.2 g/dL (ref 6.5–8.1)

## 2016-07-13 LAB — CBC WITH DIFFERENTIAL/PLATELET
BASOS ABS: 0 10*3/uL (ref 0–0.1)
BASOS PCT: 0 %
Eosinophils Absolute: 0.1 10*3/uL (ref 0–0.7)
Eosinophils Relative: 2 %
HEMATOCRIT: 36.1 % — AB (ref 40.0–52.0)
HEMOGLOBIN: 11.7 g/dL — AB (ref 13.0–18.0)
Lymphocytes Relative: 32 %
Lymphs Abs: 0.8 10*3/uL — ABNORMAL LOW (ref 1.0–3.6)
MCH: 27.9 pg (ref 26.0–34.0)
MCHC: 32.5 g/dL (ref 32.0–36.0)
MCV: 85.8 fL (ref 80.0–100.0)
Monocytes Absolute: 0.5 10*3/uL (ref 0.2–1.0)
Monocytes Relative: 21 %
NEUTROS ABS: 1.2 10*3/uL — AB (ref 1.4–6.5)
NEUTROS PCT: 45 %
Platelets: 274 10*3/uL (ref 150–440)
RBC: 4.2 MIL/uL — AB (ref 4.40–5.90)
RDW: 15.3 % — ABNORMAL HIGH (ref 11.5–14.5)
WBC: 2.6 10*3/uL — ABNORMAL LOW (ref 3.8–10.6)

## 2016-07-13 NOTE — Patient Instructions (Signed)
--  Follow up as needed, please call this office if you have any breathing issues.

## 2016-07-14 ENCOUNTER — Telehealth: Payer: Self-pay | Admitting: *Deleted

## 2016-07-14 NOTE — Telephone Encounter (Signed)
Contacted patient.  He stated that he never started the crizontinib. I explained that his lab values are stable enough to proceed. Reviewed with patient - reinforced teaching of side effects of oral chemotherapy, how to self administer drug.  Questions were answered to his satisfaction.  Teach back process performed

## 2016-07-14 NOTE — Telephone Encounter (Signed)
-----   Message from Cammie Sickle, MD sent at 07/13/2016  5:38 PM EST ----- Please inform pt to start taking crizotinib as ordered. Thx

## 2016-07-27 ENCOUNTER — Telehealth: Payer: Self-pay | Admitting: *Deleted

## 2016-07-27 ENCOUNTER — Other Ambulatory Visit: Payer: Self-pay | Admitting: *Deleted

## 2016-07-27 DIAGNOSIS — C3412 Malignant neoplasm of upper lobe, left bronchus or lung: Secondary | ICD-10-CM

## 2016-07-27 DIAGNOSIS — I2699 Other pulmonary embolism without acute cor pulmonale: Secondary | ICD-10-CM

## 2016-07-27 MED ORDER — ENOXAPARIN SODIUM 80 MG/0.8ML ~~LOC~~ SOLN: 65.0000 mg | Freq: Two times a day (BID) | SUBCUTANEOUS | 0 refills | Status: DC

## 2016-07-27 MED ORDER — ENOXAPARIN SODIUM 80 MG/0.8ML ~~LOC~~ SOLN: 65.0000 mg | Freq: Two times a day (BID) | SUBCUTANEOUS | 2 refills | Status: DC

## 2016-07-27 NOTE — Telephone Encounter (Signed)
After speaking with Dr Grayland Ormond who discussed with [name], med e scribed

## 2016-07-29 ENCOUNTER — Inpatient Hospital Stay: Payer: Managed Care, Other (non HMO)

## 2016-07-29 ENCOUNTER — Telehealth: Payer: Self-pay

## 2016-07-29 ENCOUNTER — Inpatient Hospital Stay (HOSPITAL_BASED_OUTPATIENT_CLINIC_OR_DEPARTMENT_OTHER): Payer: Managed Care, Other (non HMO) | Admitting: Internal Medicine

## 2016-07-29 VITALS — BP 113/66 | HR 71 | Temp 97.9°F | Resp 18 | Wt 148.8 lb

## 2016-07-29 DIAGNOSIS — E119 Type 2 diabetes mellitus without complications: Secondary | ICD-10-CM

## 2016-07-29 DIAGNOSIS — Z79899 Other long term (current) drug therapy: Secondary | ICD-10-CM

## 2016-07-29 DIAGNOSIS — R590 Localized enlarged lymph nodes: Secondary | ICD-10-CM

## 2016-07-29 DIAGNOSIS — I2510 Atherosclerotic heart disease of native coronary artery without angina pectoris: Secondary | ICD-10-CM

## 2016-07-29 DIAGNOSIS — C778 Secondary and unspecified malignant neoplasm of lymph nodes of multiple regions: Secondary | ICD-10-CM | POA: Diagnosis not present

## 2016-07-29 DIAGNOSIS — Z7901 Long term (current) use of anticoagulants: Secondary | ICD-10-CM

## 2016-07-29 DIAGNOSIS — C3412 Malignant neoplasm of upper lobe, left bronchus or lung: Secondary | ICD-10-CM

## 2016-07-29 DIAGNOSIS — Z86711 Personal history of pulmonary embolism: Secondary | ICD-10-CM

## 2016-07-29 DIAGNOSIS — Z7984 Long term (current) use of oral hypoglycemic drugs: Secondary | ICD-10-CM

## 2016-07-29 DIAGNOSIS — Z7982 Long term (current) use of aspirin: Secondary | ICD-10-CM

## 2016-07-29 DIAGNOSIS — N179 Acute kidney failure, unspecified: Secondary | ICD-10-CM

## 2016-07-29 DIAGNOSIS — Z8673 Personal history of transient ischemic attack (TIA), and cerebral infarction without residual deficits: Secondary | ICD-10-CM

## 2016-07-29 DIAGNOSIS — H7091 Unspecified mastoiditis, right ear: Secondary | ICD-10-CM

## 2016-07-29 DIAGNOSIS — E7800 Pure hypercholesterolemia, unspecified: Secondary | ICD-10-CM

## 2016-07-29 DIAGNOSIS — Z86718 Personal history of other venous thrombosis and embolism: Secondary | ICD-10-CM

## 2016-07-29 DIAGNOSIS — R972 Elevated prostate specific antigen [PSA]: Secondary | ICD-10-CM

## 2016-07-29 DIAGNOSIS — E785 Hyperlipidemia, unspecified: Secondary | ICD-10-CM

## 2016-07-29 DIAGNOSIS — E042 Nontoxic multinodular goiter: Secondary | ICD-10-CM

## 2016-07-29 DIAGNOSIS — R42 Dizziness and giddiness: Secondary | ICD-10-CM

## 2016-07-29 DIAGNOSIS — R110 Nausea: Secondary | ICD-10-CM | POA: Diagnosis not present

## 2016-07-29 DIAGNOSIS — K5730 Diverticulosis of large intestine without perforation or abscess without bleeding: Secondary | ICD-10-CM

## 2016-07-29 DIAGNOSIS — Z8042 Family history of malignant neoplasm of prostate: Secondary | ICD-10-CM

## 2016-07-29 DIAGNOSIS — R634 Abnormal weight loss: Secondary | ICD-10-CM

## 2016-07-29 LAB — CBC WITH DIFFERENTIAL/PLATELET
Basophils Absolute: 0 10*3/uL (ref 0–0.1)
Basophils Relative: 1 %
EOS PCT: 2 %
Eosinophils Absolute: 0.1 10*3/uL (ref 0–0.7)
HEMATOCRIT: 32.4 % — AB (ref 40.0–52.0)
Hemoglobin: 10.8 g/dL — ABNORMAL LOW (ref 13.0–18.0)
LYMPHS PCT: 23 %
Lymphs Abs: 1 10*3/uL (ref 1.0–3.6)
MCH: 28 pg (ref 26.0–34.0)
MCHC: 33.3 g/dL (ref 32.0–36.0)
MCV: 84 fL (ref 80.0–100.0)
MONO ABS: 0.9 10*3/uL (ref 0.2–1.0)
MONOS PCT: 21 %
NEUTROS ABS: 2.3 10*3/uL (ref 1.4–6.5)
Neutrophils Relative %: 53 %
PLATELETS: 387 10*3/uL (ref 150–440)
RBC: 3.86 MIL/uL — ABNORMAL LOW (ref 4.40–5.90)
RDW: 17.5 % — AB (ref 11.5–14.5)
WBC: 4.4 10*3/uL (ref 3.8–10.6)

## 2016-07-29 LAB — BASIC METABOLIC PANEL
Anion gap: 8 (ref 5–15)
BUN: 23 mg/dL — AB (ref 6–20)
CALCIUM: 9 mg/dL (ref 8.9–10.3)
CO2: 21 mmol/L — AB (ref 22–32)
CREATININE: 1.61 mg/dL — AB (ref 0.61–1.24)
Chloride: 101 mmol/L (ref 101–111)
GFR calc Af Amer: 51 mL/min — ABNORMAL LOW (ref >60)
GFR calc non Af Amer: 44 mL/min — ABNORMAL LOW (ref >60)
GLUCOSE: 177 mg/dL — AB (ref 65–99)
Potassium: 5.7 mmol/L — ABNORMAL HIGH (ref 3.5–5.1)
Sodium: 130 mmol/L — ABNORMAL LOW (ref 135–145)

## 2016-07-29 MED ORDER — ATORVASTATIN CALCIUM 40 MG PO TABS: 40.0000 mg | ORAL_TABLET | Freq: Every day | ORAL | 0 refills | Status: DC

## 2016-07-29 NOTE — Telephone Encounter (Signed)
Medication has been refilled and sent to [facility]

## 2016-07-29 NOTE — Progress Notes (Signed)
Patient is here for follow up  

## 2016-07-29 NOTE — Progress Notes (Signed)
[facility] NOTE  Patient Care Team: Roselee Nova, MD as PCP - General (Family Medicine)  CHIEF COMPLAINTS/PURPOSE OF CONSULTATION:  Lung cancer  #  Oncology History   # OCT 2017- ADENO CA LUNG; STAGE IV [; LUL; bil supraclavicular LN; Left neck LN Bx]; ROS-1 MUTATED; s/p Carbo-alimta  x1[oct 2017]  # NOV 1st 2017- XALKORI 250 mg BID  # LLE DVT/bil PE/Multiple strokes [? On xarelto]-Lovenox  # s/p TURP [Sep 2017; Dr.Cope]; MRI brain- multiple infarcts [2d echo/bubble study-NEG's/p Neurology eval]   # MOLECULAR TESTING- ROS-1 POSITIVE; ALK/EGFR-NEG; PDL-1 EXPRESSION- 90% [HIGH]     Primary cancer of left upper lobe of lung ([place])   06/12/2016 Initial Diagnosis    Primary cancer of left upper lobe of lung ([place])       HISTORY OF PRESENTING ILLNESS:  Melvin Willis 65 y.o.  male with newly diagnosedMetastatic lung cancer ROS- positive; Multiple strokes secondary to his metastatic lung cancer- currently on Lovenox/ Crizotinib is here for follow-up.  Patient continues to have intermittent dizzy spells especially with change of position. He admits to not drinking lots of fluids. His appetite is improving. However lost a few pounds. He does admit to vision changes especially with moving from dark to lighted areas.   Otherwise no vomiting no diarrhea no constipation. Denies any bleeding. Denies any worse and shortness of breath or cough.  ROS: A complete 10 point review of system is done which is negative except mentioned above in history of present illness  MEDICAL HISTORY:  Past Medical History:  Diagnosis Date  . Abnormal prostate specific antigen 08/08/2012  . Adiposity 04/16/2015  . CVA (cerebral vascular accident) ([place]) 06/17/2016  . Diabetes mellitus without complication ([place])   . Diverticulosis of sigmoid colon 04/16/2015  . Dyslipidemia 03/18/2015  . Hemorrhoids, internal 04/16/2015  . Hypercholesteremia 04/16/2015  . Hyperlipidemia   .  Hypertension   . Primary cancer of left upper lobe of lung ([place])   . Pulmonary embolism ([place])   . Wears dentures    partial upper    SURGICAL HISTORY: Past Surgical History:  Procedure Laterality Date  . COLONOSCOPY    . COLONOSCOPY WITH PROPOFOL N/A 05/06/2015   Procedure: COLONOSCOPY WITH PROPOFOL;  Surgeon: Lucilla Lame, MD;  Location: [facility];  Service: Endoscopy;  Laterality: N/A;  ASCENDING COLON POLYPS X 2 TERMINAL ILEUM BIOPSY RANDOM COLON BX. TRANSVERSE COLON POLYP SIGMOID COLON POLYP  . ESOPHAGOGASTRODUODENOSCOPY (EGD) WITH PROPOFOL N/A 05/06/2015   Procedure: ESOPHAGOGASTRODUODENOSCOPY (EGD) WITH PROPOFOL;  Surgeon: Lucilla Lame, MD;  Location: [facility];  Service: Endoscopy;  Laterality: N/A;  GASTRIC BIOPSY X1    SOCIAL HISTORY: Social History   Social History  . Marital status: Married    Spouse name: N/A  . Number of children: N/A  . Years of education: N/A   Occupational History  . Not on file.   Social History Main Topics  . Smoking status: Never Smoker  . Smokeless tobacco: Never Used  . Alcohol use No  . Drug use: No  . Sexual activity: Yes    Partners: Female   Other Topics Concern  . Not on file   Social History Narrative  . No narrative on file    FAMILY HISTORY: Family History  Problem Relation Age of Onset  . Diabetes Mother   . Diabetes Father   . CAD Father   . Dementia Father   . Diabetes Sister   . Cancer Maternal  Uncle     Prostate  . Cancer Cousin     prostate    ALLERGIES:  has No Known Allergies.  MEDICATIONS:  Current Outpatient Prescriptions  Medication Sig Dispense Refill  . aspirin EC 81 MG EC tablet Take 1 tablet (81 mg total) by mouth daily.    Marland Kitchen atorvastatin (LIPITOR) 40 MG tablet Take 40 mg by mouth daily.    . crizotinib (XALKORI) 250 MG capsule Take 1 capsule (250 mg total) by mouth 2 (two) times daily. 60 capsule 4  . enoxaparin (LOVENOX) 80 MG/0.8ML injection Inject 0.65 mLs (65 mg  total) into the skin every 12 (twelve) hours. 60 Syringe 2  . finasteride (PROSCAR) 5 MG tablet Take 5 mg by mouth at bedtime.     Marland Kitchen glipiZIDE (GLUCOTROL) 5 MG tablet Take 1 tablet (5 mg total) by mouth 2 (two) times daily before a meal. 180 tablet 0  . lisinopril (PRINIVIL,ZESTRIL) 10 MG tablet TAKE ONE TABLET BY MOUTH ONCE DAILY 90 tablet 0  . metFORMIN (GLUCOPHAGE) 1000 MG tablet Take 0.5 tablets (500 mg total) by mouth 2 (two) times daily. 90 tablet 0  . ondansetron (ZOFRAN) 8 MG tablet Take 1 tablet (8 mg total) by mouth every 8 (eight) hours as needed for nausea or vomiting (start 3 days; after chemo). 40 tablet 1  . prochlorperazine (COMPAZINE) 10 MG tablet Take 1 tablet (10 mg total) by mouth every 6 (six) hours as needed for nausea or vomiting. 40 tablet 1  . tamsulosin (FLOMAX) 0.4 MG CAPS capsule Take 0.4 mg by mouth every morning.      No current facility-administered medications for this visit.       Marland Kitchen  PHYSICAL EXAMINATION: ECOG PERFORMANCE STATUS: 0 - Asymptomatic  Vitals:   07/29/16 1425  BP: 113/66  Pulse: 71  Resp: 18  Temp: 97.9 F (36.6 C)   Filed Weights   07/29/16 1425  Weight: 148 lb 12.8 oz (67.5 kg)    GENERAL: Well-nourished well-developed; Alert, no distress and comfortable.   With his wife.  EYES: no pallor or icterus OROPHARYNX: no thrush or ulceration; good dentition  NECK: supple, no masses felt LYMPH:  no palpable lymphadenopathy in the cervical, axillary or inguinal regions LUNGS: clear to auscultation and  No wheeze or crackles HEART/CVS: regular rate & rhythm and no murmurs; No lower extremity edema ABDOMEN: abdomen soft, non-tender and normal bowel sounds Musculoskeletal:no cyanosis of digits and no clubbing  PSYCH: alert & oriented x 3 with fluent speech NEURO: no focal motor/sensory deficits SKIN:  no rashes or significant lesions  LABORATORY DATA:  I have reviewed the data as listed Lab Results  Component Value Date   WBC 4.4  07/29/2016   HGB 10.8 (L) 07/29/2016   HCT 32.4 (L) 07/29/2016   MCV 84.0 07/29/2016   PLT 387 07/29/2016    Recent Labs  06/24/16 0632 07/08/16 1635 07/13/16 1315 07/29/16 1349  NA 134* 136 133* 130*  K 4.6 4.4 4.6 5.7*  CL 101 104 101 101  CO2 _0 21*  GLUCOSE 151* 157* 279* 177*  BUN _1 23*  CREATININE 1.07 1.12 1.12 1.61*  CALCIUM 9.7 9.6 9.6 9.0  GFRNONAA >60 >60 >60 44*  GFRAA >60 >60 >60 51*  PROT 7.5 7.3 7.2  --   ALBUMIN 3.8 3.8 3.9  --   AST 25 60* 46*  --   ALT 41 154* 143*  --   ALKPHOS 77 76 81  --  BILITOT 0.8 <0.1* 0.2*  --     RADIOGRAPHIC STUDIES: I have personally reviewed the radiological images as listed and agreed with the findings in the report. No results found.  ASSESSMENT & PLAN:   Primary cancer of left upper lobe of lung ([place]) # Adenocarcinoma of the lung metastatic/stage IV- bilateral supraclavicular adenopathy; ROS-1 POSITIVE. Currently on Crizotinib 250 mg BID [since Nov]; Tolerating well except for visual changes.   # Acute renal failure- creat 1.6/K- 5.7; hold lisinopril for now; also cut metformin to 500/day. Repeat bmp on 27th. Drink fluids.    # MRI brain- multiple strokes; sec to malignancy. Continue Lovenox BID+ asprin 81 mg/day;   # Bilateral PE left lower extremity DVT- on Lovenox.  # Visual changes- from crizotinib.   # Follow up in 3 weeks/labs.      Cammie Sickle, MD 07/29/2016 2:49 PM

## 2016-07-29 NOTE — Assessment & Plan Note (Addendum)
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 Currently on Crizotinib 250 mg BID [since Nov]; Tolerating well except for visual changes.   # Acute renal failure- creat 1.6/K- 5.7; hold lisinopril for now; also cut metformin to 500/day. Repeat bmp on 27th. Drink fluids.    # MRI brain- multiple strokes; sec to malignancy. Continue Lovenox BID+ asprin 81 mg/day;   # Bilateral PE left lower extremity DVT- on Lovenox.  # Visual changes- from crizotinib.   # Follow up in 3 weeks/labs.

## 2016-08-03 ENCOUNTER — Inpatient Hospital Stay: Payer: Managed Care, Other (non HMO)

## 2016-08-03 DIAGNOSIS — C3412 Malignant neoplasm of upper lobe, left bronchus or lung: Secondary | ICD-10-CM | POA: Diagnosis not present

## 2016-08-03 LAB — BASIC METABOLIC PANEL
ANION GAP: 8 (ref 5–15)
BUN: 15 mg/dL (ref 6–20)
CALCIUM: 9 mg/dL (ref 8.9–10.3)
CHLORIDE: 102 mmol/L (ref 101–111)
CO2: 24 mmol/L (ref 22–32)
Creatinine, Ser: 1.44 mg/dL — ABNORMAL HIGH (ref 0.61–1.24)
GFR calc non Af Amer: 51 mL/min — ABNORMAL LOW (ref >60)
GFR, EST AFRICAN AMERICAN: 59 mL/min — AB (ref >60)
Glucose, Bld: 135 mg/dL — ABNORMAL HIGH (ref 65–99)
POTASSIUM: 4.5 mmol/L (ref 3.5–5.1)
Sodium: 134 mmol/L — ABNORMAL LOW (ref 135–145)

## 2016-08-04 ENCOUNTER — Telehealth: Payer: Self-pay | Admitting: *Deleted

## 2016-08-04 NOTE — Telephone Encounter (Signed)
Inquiring a call back to let him know the status of his disability

## 2016-08-04 NOTE — Telephone Encounter (Signed)
Per Marlowe Kays, she mailed it last week and she will contact him after lunch

## 2016-08-04 NOTE — Progress Notes (Signed)
LMTCB

## 2016-08-05 ENCOUNTER — Telehealth: Payer: Self-pay | Admitting: *Deleted

## 2016-08-05 NOTE — Telephone Encounter (Signed)
Notified pt of results, Pt verbalized understanding.  Patient questioned Prior Authorization for crizotinib Hulda Humphrey) 250 mg capsule, advised pt that we would contact insurance and to complete the prior auth.

## 2016-08-05 NOTE — Telephone Encounter (Signed)
-----   Message from Cammie Sickle, MD sent at 08/03/2016  4:14 PM EST ----- Please inform pt that labs- look okay; continue crizotinib as recommended. Thx

## 2016-08-06 ENCOUNTER — Telehealth: Payer: Self-pay | Admitting: *Deleted

## 2016-08-06 NOTE — Telephone Encounter (Signed)
Carolyn at [employer] left vm yesterday stating patient's crizotinib needs prior auth. Unable to perform this step insurance will not allow her to do prior auth.  She gave me [employer] phone #.  [phone].  spoke with rep at [facility] to determine drug shipment status.  Have already reached out to the patient once and left msg. Shipment is ready to be shipped. Has been unsuccessful reaching the patient.  The rep asked me to provide the patient with the phone number [phone] to arrange for shipment.  I personally contacted Mr. Yeatts and gave him the phone number and asked him to call [employer] asap to arrange shipment. Teach back process performed.

## 2016-08-07 ENCOUNTER — Telehealth: Payer: Self-pay | Admitting: *Deleted

## 2016-08-07 ENCOUNTER — Other Ambulatory Visit: Payer: Self-pay | Admitting: *Deleted

## 2016-08-07 DIAGNOSIS — C3412 Malignant neoplasm of upper lobe, left bronchus or lung: Secondary | ICD-10-CM

## 2016-08-07 DIAGNOSIS — I2699 Other pulmonary embolism without acute cor pulmonale: Secondary | ICD-10-CM

## 2016-08-07 MED ORDER — ENOXAPARIN SODIUM 80 MG/0.8ML ~~LOC~~ SOLN: 65.0000 mg | Freq: Two times a day (BID) | SUBCUTANEOUS | 2 refills | Status: DC

## 2016-08-07 NOTE — Telephone Encounter (Signed)
Patient needs RF lovenox 80 mg sent to pharmacy - insurance will only allow limited amount of syringes dispensed per week.  RF sent to [address] per pt request.

## 2016-08-07 NOTE — Telephone Encounter (Signed)
Prior auth # [id number] good for 1 year per [place].

## 2016-08-19 ENCOUNTER — Inpatient Hospital Stay: Payer: Managed Care, Other (non HMO) | Attending: Internal Medicine

## 2016-08-19 ENCOUNTER — Inpatient Hospital Stay (HOSPITAL_BASED_OUTPATIENT_CLINIC_OR_DEPARTMENT_OTHER): Payer: Managed Care, Other (non HMO) | Admitting: Internal Medicine

## 2016-08-19 VITALS — BP 138/77 | HR 66 | Temp 97.0°F | Resp 18 | Wt 148.4 lb

## 2016-08-19 DIAGNOSIS — Z79899 Other long term (current) drug therapy: Secondary | ICD-10-CM | POA: Insufficient documentation

## 2016-08-19 DIAGNOSIS — R5383 Other fatigue: Secondary | ICD-10-CM | POA: Insufficient documentation

## 2016-08-19 DIAGNOSIS — C3412 Malignant neoplasm of upper lobe, left bronchus or lung: Secondary | ICD-10-CM | POA: Insufficient documentation

## 2016-08-19 DIAGNOSIS — H539 Unspecified visual disturbance: Secondary | ICD-10-CM | POA: Insufficient documentation

## 2016-08-19 DIAGNOSIS — E7800 Pure hypercholesterolemia, unspecified: Secondary | ICD-10-CM | POA: Insufficient documentation

## 2016-08-19 DIAGNOSIS — C770 Secondary and unspecified malignant neoplasm of lymph nodes of head, face and neck: Secondary | ICD-10-CM

## 2016-08-19 DIAGNOSIS — Z8673 Personal history of transient ischemic attack (TIA), and cerebral infarction without residual deficits: Secondary | ICD-10-CM | POA: Diagnosis not present

## 2016-08-19 DIAGNOSIS — Z8042 Family history of malignant neoplasm of prostate: Secondary | ICD-10-CM | POA: Diagnosis not present

## 2016-08-19 DIAGNOSIS — Z7982 Long term (current) use of aspirin: Secondary | ICD-10-CM | POA: Diagnosis not present

## 2016-08-19 DIAGNOSIS — N189 Chronic kidney disease, unspecified: Secondary | ICD-10-CM | POA: Insufficient documentation

## 2016-08-19 DIAGNOSIS — Z8719 Personal history of other diseases of the digestive system: Secondary | ICD-10-CM | POA: Insufficient documentation

## 2016-08-19 DIAGNOSIS — I129 Hypertensive chronic kidney disease with stage 1 through stage 4 chronic kidney disease, or unspecified chronic kidney disease: Secondary | ICD-10-CM

## 2016-08-19 DIAGNOSIS — R972 Elevated prostate specific antigen [PSA]: Secondary | ICD-10-CM

## 2016-08-19 DIAGNOSIS — Z7984 Long term (current) use of oral hypoglycemic drugs: Secondary | ICD-10-CM | POA: Insufficient documentation

## 2016-08-19 DIAGNOSIS — E785 Hyperlipidemia, unspecified: Secondary | ICD-10-CM | POA: Diagnosis not present

## 2016-08-19 DIAGNOSIS — Z86718 Personal history of other venous thrombosis and embolism: Secondary | ICD-10-CM | POA: Insufficient documentation

## 2016-08-19 DIAGNOSIS — Z7901 Long term (current) use of anticoagulants: Secondary | ICD-10-CM | POA: Insufficient documentation

## 2016-08-19 DIAGNOSIS — I639 Cerebral infarction, unspecified: Secondary | ICD-10-CM

## 2016-08-19 DIAGNOSIS — I10 Essential (primary) hypertension: Secondary | ICD-10-CM | POA: Insufficient documentation

## 2016-08-19 DIAGNOSIS — M7989 Other specified soft tissue disorders: Secondary | ICD-10-CM | POA: Diagnosis not present

## 2016-08-19 DIAGNOSIS — E119 Type 2 diabetes mellitus without complications: Secondary | ICD-10-CM | POA: Diagnosis not present

## 2016-08-19 LAB — COMPREHENSIVE METABOLIC PANEL
ALBUMIN: 3.5 g/dL (ref 3.5–5.0)
ALK PHOS: 86 U/L (ref 38–126)
ALT: 57 U/L (ref 17–63)
AST: 30 U/L (ref 15–41)
Anion gap: 9 (ref 5–15)
BUN: 19 mg/dL (ref 6–20)
CALCIUM: 9.1 mg/dL (ref 8.9–10.3)
CO2: 23 mmol/L (ref 22–32)
CREATININE: 1.58 mg/dL — AB (ref 0.61–1.24)
Chloride: 102 mmol/L (ref 101–111)
GFR calc Af Amer: 52 mL/min — ABNORMAL LOW (ref >60)
GFR calc non Af Amer: 45 mL/min — ABNORMAL LOW (ref >60)
GLUCOSE: 236 mg/dL — AB (ref 65–99)
Potassium: 4.2 mmol/L (ref 3.5–5.1)
SODIUM: 134 mmol/L — AB (ref 135–145)
Total Bilirubin: 0.4 mg/dL (ref 0.3–1.2)
Total Protein: 7.1 g/dL (ref 6.5–8.1)

## 2016-08-19 LAB — CBC WITH DIFFERENTIAL/PLATELET
BASOS PCT: 1 %
Basophils Absolute: 0 10*3/uL (ref 0–0.1)
EOS ABS: 0.4 10*3/uL (ref 0–0.7)
Eosinophils Relative: 9 %
HCT: 35.3 % — ABNORMAL LOW (ref 40.0–52.0)
HEMOGLOBIN: 11.9 g/dL — AB (ref 13.0–18.0)
Lymphocytes Relative: 33 %
Lymphs Abs: 1.3 10*3/uL (ref 1.0–3.6)
MCH: 28 pg (ref 26.0–34.0)
MCHC: 33.6 g/dL (ref 32.0–36.0)
MCV: 83.3 fL (ref 80.0–100.0)
Monocytes Absolute: 0.7 10*3/uL (ref 0.2–1.0)
Monocytes Relative: 17 %
NEUTROS PCT: 40 %
Neutro Abs: 1.6 10*3/uL (ref 1.4–6.5)
Platelets: 367 10*3/uL (ref 150–440)
RBC: 4.24 MIL/uL — AB (ref 4.40–5.90)
RDW: 17.9 % — ABNORMAL HIGH (ref 11.5–14.5)
WBC: 4 10*3/uL (ref 3.8–10.6)

## 2016-08-19 NOTE — Assessment & Plan Note (Addendum)
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 Currently on Crizotinib 250 mg BID [since Nov]; Tolerating well except for mild fatigue.   # MRI brain- multiple strokes; sec to malignancy. Continue Lovenox BID+ asprin 81 mg/day; tolerating without any bleeding. Repeat MRI of the brain; in about 4 weeks; and then will review regarding Lovenox dosing/question once a day.   #  Renal failure- creatinine 1.5/stable. Continue to hold off lisinopril.   # diabetes- continue metformin once a day; and Glucotrol.   # Bilateral PE left lower extremity DVT- on Lovenox.   # Visual changes- from crizotinib- improved.  # fatigue question from low testosterone.   # disability questionnaire with the filled out.   # Follow up in 5 weeks/labs; 4 weeks- labs/testosterone.

## 2016-08-19 NOTE — Progress Notes (Signed)
[facility] NOTE  Patient Care Team: [facility]lee Nova, MD as PCP - General (Family Medicine)  CHIEF COMPLAINTS/PURPOSE OF CONSULTATION:  Lung cancer  #  Oncology History   # OCT 2017- ADENO CA LUNG; STAGE IV [; LUL; bil supraclavicular LN; Left neck LN Bx]; ROS-1 MUTATED; s/p Carbo-alimta  x1[oct 2017]  # NOV 1st 2017- XALKORI 250 mg BID  # LLE DVT/bil PE/Multiple strokes [? On xarelto]-Lovenox  # s/p TURP [Sep 2017; Dr.Cope]; MRI brain- multiple infarcts [2d echo/bubble study-NEG's/p Neurology eval]   # MOLECULAR TESTING- ROS-1 POSITIVE; ALK/EGFR-NEG; PDL-1 EXPRESSION- 90% [HIGH]     Primary cancer of left upper lobe of lung ([place])   06/12/2016 Initial Diagnosis    Primary cancer of left upper lobe of lung ([place])       HISTORY OF PRESENTING ILLNESS:  Melvin Willis 65 y.o.  male with newly diagnosedMetastatic lung cancer ROS- positive; Multiple strokes secondary to his metastatic lung cancer- currently on Lovenox/ Crizotinib is here for follow-up.  Patient disease percent improved. Denies any headaches. Appetite improving. No weight loss. Continues to have slight changes to vision changes especially with moving from dark to lighted areas. Complains of mild swelling in the legs especially dependant.   Otherwise no vomiting no diarrhea no constipation. Denies any bleeding. Denies any worse and shortness of breath or cough.  ROS: A complete 10 point review of system is done which is negative except mentioned above in history of present illness  MEDICAL HISTORY:  Past Medical History:  Diagnosis Date  . Abnormal prostate specific antigen 08/08/2012  . Adiposity 04/16/2015  . CVA (cerebral vascular accident) ([place]) 06/17/2016  . Diabetes mellitus without complication ([place])   . Diverticulosis of sigmoid colon 04/16/2015  . Dyslipidemia 03/18/2015  . Hemorrhoids, internal 04/16/2015  . Hypercholesteremia 04/16/2015  . Hyperlipidemia   . Hypertension   .  Primary cancer of left upper lobe of lung ([place])   . Pulmonary embolism ([place])   . Wears dentures    partial upper    SURGICAL HISTORY: Past Surgical History:  Procedure Laterality Date  . COLONOSCOPY    . COLONOSCOPY WITH PROPOFOL N/A 05/06/2015   Procedure: COLONOSCOPY WITH PROPOFOL;  Surgeon: Lucilla Lame, MD;  Location: [facility];  Service: Endoscopy;  Laterality: N/A;  ASCENDING COLON POLYPS X 2 TERMINAL ILEUM BIOPSY RANDOM COLON BX. TRANSVERSE COLON POLYP SIGMOID COLON POLYP  . ESOPHAGOGASTRODUODENOSCOPY (EGD) WITH PROPOFOL N/A 05/06/2015   Procedure: ESOPHAGOGASTRODUODENOSCOPY (EGD) WITH PROPOFOL;  Surgeon: Lucilla Lame, MD;  Location: [facility];  Service: Endoscopy;  Laterality: N/A;  GASTRIC BIOPSY X1    SOCIAL HISTORY: Social History   Social History  . Marital status: Married    Spouse name: N/A  . Number of children: N/A  . Years of education: N/A   Occupational History  . Not on file.   Social History Main Topics  . Smoking status: Never Smoker  . Smokeless tobacco: Never Used  . Alcohol use No  . Drug use: No  . Sexual activity: Yes    Partners: Female   Other Topics Concern  . Not on file   Social History Narrative  . No narrative on file    FAMILY HISTORY: Family History  Problem Relation Age of Onset  . Diabetes Mother   . Diabetes Father   . CAD Father   . Dementia Father   . Diabetes Sister   . Cancer Maternal Uncle     Prostate  .  Cancer Cousin     prostate    ALLERGIES:  has No Known Allergies.  MEDICATIONS:  Current Outpatient Prescriptions  Medication Sig Dispense Refill  . aspirin EC 81 MG EC tablet Take 1 tablet (81 mg total) by mouth daily.    Marland Kitchen atorvastatin (LIPITOR) 40 MG tablet Take 1 tablet (40 mg total) by mouth daily. 90 tablet 0  . crizotinib (XALKORI) 250 MG capsule Take 1 capsule (250 mg total) by mouth 2 (two) times daily. 60 capsule 4  . enoxaparin (LOVENOX) 80 MG/0.8ML injection Inject 0.65 mLs  (65 mg total) into the skin every 12 (twelve) hours. 60 Syringe 2  . finasteride (PROSCAR) 5 MG tablet Take 5 mg by mouth at bedtime.     Marland Kitchen glipiZIDE (GLUCOTROL) 5 MG tablet Take 1 tablet (5 mg total) by mouth 2 (two) times daily before a meal. 180 tablet 0  . metFORMIN (GLUCOPHAGE) 1000 MG tablet Take 0.5 tablets (500 mg total) by mouth 2 (two) times daily. 90 tablet 0  . ondansetron (ZOFRAN) 8 MG tablet Take 1 tablet (8 mg total) by mouth every 8 (eight) hours as needed for nausea or vomiting (start 3 days; after chemo). 40 tablet 1  . prochlorperazine (COMPAZINE) 10 MG tablet Take 1 tablet (10 mg total) by mouth every 6 (six) hours as needed for nausea or vomiting. 40 tablet 1  . tamsulosin (FLOMAX) 0.4 MG CAPS capsule Take 0.4 mg by mouth every morning.     Marland Kitchen lisinopril (PRINIVIL,ZESTRIL) 10 MG tablet TAKE ONE TABLET BY MOUTH ONCE DAILY (Patient not taking: Reported on 08/19/2016) 90 tablet 0   No current facility-administered medications for this visit.       Marland Kitchen  PHYSICAL EXAMINATION: ECOG PERFORMANCE STATUS: 2 - Symptomatic, <50% confined to bed  Vitals:   08/19/16 1151  BP: 138/77  Pulse: 66  Resp: 18  Temp: 97 F (36.1 C)   Filed Weights   08/19/16 1151  Weight: 148 lb 6.4 oz (67.3 kg)    GENERAL: Well-nourished well-developed; Alert, no distress and comfortable.   With his wife.  EYES: no pallor or icterus OROPHARYNX: no thrush or ulceration; good dentition  NECK: supple, no masses felt LYMPH:  no palpable lymphadenopathy in the cervical, axillary or inguinal regions LUNGS: clear to auscultation and  No wheeze or crackles HEART/CVS: regular rate & rhythm and no murmurs; No lower extremity edema ABDOMEN: abdomen soft, non-tender and normal bowel sounds Musculoskeletal:no cyanosis of digits and no clubbing  PSYCH: alert & oriented x 3 with fluent speech NEURO: no focal motor/sensory deficits SKIN:  no rashes or significant lesions  LABORATORY DATA:  I have reviewed  the data as listed Lab Results  Component Value Date   WBC 4.0 08/19/2016   HGB 11.9 (L) 08/19/2016   HCT 35.3 (L) 08/19/2016   MCV 83.3 08/19/2016   PLT 367 08/19/2016    Recent Labs  07/08/16 1635 07/13/16 1315 07/29/16 1349 08/03/16 1335 08/19/16 1132  NA 136 133* 130* 134* 134*  K 4.4 4.6 5.7* 4.5 4.2  CL 104 101 101 102 102  CO2 26 25 21* 24 23  GLUCOSE 157* 279* 177* 135* 236*  BUN 11 13 23* 15 19  CREATININE 1.12 1.12 1.61* 1.44* 1.58*  CALCIUM 9.6 9.6 9.0 9.0 9.1  GFRNONAA >60 >60 44* 51* 45*  GFRAA >60 >60 51* 59* 52*  PROT 7.3 7.2  --   --  7.1  ALBUMIN 3.8 3.9  --   --  3.5  AST 60* 46*  --   --  30  ALT 154* 143*  --   --  57  ALKPHOS 76 81  --   --  86  BILITOT <0.1* 0.2*  --   --  0.4    RADIOGRAPHIC STUDIES: I have personally reviewed the radiological images as listed and agreed with the findings in the report. No results found.  ASSESSMENT & PLAN:   Primary cancer of left upper lobe of lung ([place]) # Adenocarcinoma of the lung metastatic/stage IV- bilateral supraclavicular adenopathy; ROS-1 POSITIVE. Currently on Crizotinib 250 mg BID [since Nov]; Tolerating well except for mild fatigue.   # MRI brain- multiple strokes; sec to malignancy. Continue Lovenox BID+ asprin 81 mg/day; tolerating without any bleeding. Repeat MRI of the brain; in about 4 weeks; and then will review regarding Lovenox dosing/question once a day.   #  Renal failure- creatinine 1.5/stable. Continue to hold off lisinopril.   # diabetes- continue metformin once a day; and Glucotrol.   # Bilateral PE left lower extremity DVT- on Lovenox.   # Visual changes- from crizotinib- improved.  # fatigue question from low testosterone.   # disability questionnaire with the filled out.   # Follow up in 5 weeks/labs; 4 weeks- labs/testosterone.      Cammie Sickle, MD 08/19/2016 12:58 PM

## 2016-08-19 NOTE — Progress Notes (Signed)
Patient is here for follow up, he mentions his left foot had swelling but is better after elevation

## 2016-08-25 ENCOUNTER — Telehealth: Payer: Self-pay | Admitting: *Deleted

## 2016-08-25 NOTE — Telephone Encounter (Signed)
Patient called to inquire about his Disability Paperwork, He would like Dr. Andre Lefort nurse to call him and let him know if you have received the paperwork.

## 2016-09-01 ENCOUNTER — Emergency Department
Admission: EM | Admit: 2016-09-01 | Discharge: 2016-09-01 | Disposition: A | Discharge status: Home / Self Care | Payer: Managed Care, Other (non HMO) | Attending: Student in an Organized Health Care Education/Training Program | Admitting: Student in an Organized Health Care Education/Training Program

## 2016-09-01 ENCOUNTER — Telehealth: Payer: Self-pay | Admitting: *Deleted

## 2016-09-01 ENCOUNTER — Emergency Department: Payer: Managed Care, Other (non HMO)

## 2016-09-01 ENCOUNTER — Encounter: Payer: Self-pay | Admitting: *Deleted

## 2016-09-01 DIAGNOSIS — R319 Hematuria, unspecified: Secondary | ICD-10-CM | POA: Diagnosis present

## 2016-09-01 DIAGNOSIS — E119 Type 2 diabetes mellitus without complications: Secondary | ICD-10-CM | POA: Insufficient documentation

## 2016-09-01 DIAGNOSIS — Z7982 Long term (current) use of aspirin: Secondary | ICD-10-CM | POA: Diagnosis not present

## 2016-09-01 DIAGNOSIS — N429 Disorder of prostate, unspecified: Secondary | ICD-10-CM | POA: Diagnosis not present

## 2016-09-01 DIAGNOSIS — Z7984 Long term (current) use of oral hypoglycemic drugs: Secondary | ICD-10-CM | POA: Insufficient documentation

## 2016-09-01 DIAGNOSIS — I10 Essential (primary) hypertension: Secondary | ICD-10-CM | POA: Diagnosis not present

## 2016-09-01 LAB — URINALYSIS, COMPLETE (UACMP) WITH MICROSCOPIC
BACTERIA UA: NONE SEEN
Specific Gravity, Urine: 1.01 (ref 1.005–1.030)
Squamous Epithelial / LPF: NONE SEEN
WBC UA: NONE SEEN WBC/hpf (ref 0–5)

## 2016-09-01 LAB — CBC
HCT: 37.8 % — ABNORMAL LOW (ref 40.0–52.0)
Hemoglobin: 12.7 g/dL — ABNORMAL LOW (ref 13.0–18.0)
MCH: 28.4 pg (ref 26.0–34.0)
MCHC: 33.6 g/dL (ref 32.0–36.0)
MCV: 84.6 fL (ref 80.0–100.0)
PLATELETS: 302 10*3/uL (ref 150–440)
RBC: 4.46 MIL/uL (ref 4.40–5.90)
RDW: 19.1 % — ABNORMAL HIGH (ref 11.5–14.5)
WBC: 4.8 10*3/uL (ref 3.8–10.6)

## 2016-09-01 LAB — BASIC METABOLIC PANEL
Anion gap: 9 (ref 5–15)
BUN: 13 mg/dL (ref 6–20)
CALCIUM: 9.2 mg/dL (ref 8.9–10.3)
CO2: 20 mmol/L — ABNORMAL LOW (ref 22–32)
CREATININE: 1.31 mg/dL — AB (ref 0.61–1.24)
Chloride: 103 mmol/L (ref 101–111)
GFR calc Af Amer: >60 mL/min (ref >60)
GFR, EST NON AFRICAN AMERICAN: 56 mL/min — AB (ref >60)
Glucose, Bld: 179 mg/dL — ABNORMAL HIGH (ref 65–99)
POTASSIUM: 4.2 mmol/L (ref 3.5–5.1)
SODIUM: 132 mmol/L — AB (ref 135–145)

## 2016-09-01 LAB — APTT: aPTT: 39 seconds — ABNORMAL HIGH (ref 24–36)

## 2016-09-01 LAB — PROTIME-INR
INR: 1.13
PROTHROMBIN TIME: 14.6 s (ref 11.4–15.2)

## 2016-09-01 MED ORDER — FINASTERIDE 5 MG PO TABS: 5.0000 mg | ORAL_TABLET | Freq: Every day | ORAL | 0 refills | Status: DC

## 2016-09-01 NOTE — Telephone Encounter (Signed)
Patient advised to go to ER per order of Dr Janese Banks. He reluctantly agreed to go.

## 2016-09-01 NOTE — ED Notes (Signed)
Pt presents with hematuria x 2 days. States he has hx of kidney stones and PE; takes lovenox. Pt denies pain, urgency, frequency at this time.

## 2016-09-01 NOTE — Telephone Encounter (Signed)
Ask the patient to go to ER to get evaluated. He is on lovenox as well

## 2016-09-01 NOTE — ED Notes (Signed)
Verified that lab has blue tube to run coag study

## 2016-09-01 NOTE — Telephone Encounter (Signed)
Reports that he is passing bright red blood in his urine like when he had a kidney stone in past, denies pain. It started Sunday, and has not cleared up other than to change from bright red to dark red and back to bright red. Please advise

## 2016-09-01 NOTE — ED Provider Notes (Signed)
[facility] Emergency Department Provider Note    None    (approximate)  I have reviewed the triage vital signs and the nursing notes.   HISTORY  Chief Complaint Hematuria    HPI Melvin Willis is a 65 y.o. male on Lovenox for history of recently diagnosed pulmonary embolism presents with 2 days of hematuria. Denies any pain. States is predominantly bright red blood. States he does have a history of kidney stones as well as enlarged prostate for which he takes Flomax. States he's not had any difficulty urinating.  Denies any fevers. He called his oncologist who directed him to come to the ER for further evaluation.   Past Medical History:  Diagnosis Date  . Abnormal prostate specific antigen 08/08/2012  . Adiposity 04/16/2015  . CVA (cerebral vascular accident) ([place]) 06/17/2016  . Diabetes mellitus without complication ([place])   . Diverticulosis of sigmoid colon 04/16/2015  . Dyslipidemia 03/18/2015  . Hemorrhoids, internal 04/16/2015  . Hypercholesteremia 04/16/2015  . Hyperlipidemia   . Hypertension   . Primary cancer of left upper lobe of lung ([place])   . Pulmonary embolism ([place])   . Wears dentures    partial upper   Family History  Problem Relation Age of Onset  . Diabetes Mother   . Diabetes Father   . CAD Father   . Dementia Father   . Diabetes Sister   . Cancer Maternal Uncle     Prostate  . Cancer Cousin     prostate   Past Surgical History:  Procedure Laterality Date  . COLONOSCOPY    . COLONOSCOPY WITH PROPOFOL N/A 05/06/2015   Procedure: COLONOSCOPY WITH PROPOFOL;  Surgeon: Lucilla Lame, MD;  Location: [facility];  Service: Endoscopy;  Laterality: N/A;  ASCENDING COLON POLYPS X 2 TERMINAL ILEUM BIOPSY RANDOM COLON BX. TRANSVERSE COLON POLYP SIGMOID COLON POLYP  . ESOPHAGOGASTRODUODENOSCOPY (EGD) WITH PROPOFOL N/A 05/06/2015   Procedure: ESOPHAGOGASTRODUODENOSCOPY (EGD) WITH PROPOFOL;  Surgeon: Lucilla Lame, MD;  Location:  [facility];  Service: Endoscopy;  Laterality: N/A;  GASTRIC BIOPSY X1   Patient Active Problem List   Diagnosis Date Noted  . Acute ischemic stroke ([place]) 06/25/2016  . Primary malignant neoplasm of lung metastatic to other site ([place]) 06/24/2016  . Elevated troponin 06/24/2016  . Acute cerebral infarction ([place])   . Blurred vision, bilateral 06/23/2016  . Primary cancer of left upper lobe of lung ([place]) 06/12/2016  . Mediastinal mass   . Chest pain 06/03/2016  . DVT (deep venous thrombosis) ([place]) 06/03/2016  . PE (pulmonary thromboembolism) ([place]) 06/03/2016  . Hilar mass 06/03/2016  . Pharyngeal dysphagia 01/01/2016  . Benign neoplasm of ascending colon   . Benign neoplasm of sigmoid colon   . Benign neoplasm of transverse colon   . Diverticulosis of large intestine without diverticulitis   . Diverticulosis of sigmoid colon 04/16/2015  . Hypercholesteremia 04/16/2015  . Adiposity 04/16/2015  . Diabetes mellitus without complication ([place]) 54/65/6812  . Dyslipidemia 03/18/2015  . Disorder of male genital organ 08/08/2012  . Abnormal prostate specific antigen 08/08/2012      Prior to Admission medications   Medication Sig Start Date End Date Taking? Authorizing Provider  aspirin EC 81 MG EC tablet Take 1 tablet (81 mg total) by mouth daily. 06/26/16   Srikar Sudini, MD  atorvastatin (LIPITOR) 40 MG tablet Take 1 tablet (40 mg total) by mouth daily. 07/29/16   Roselee Nova, MD  crizotinib Hulda Humphrey) 250 MG capsule  Take 1 capsule (250 mg total) by mouth 2 (two) times daily. 06/19/16   Cammie Sickle, MD  enoxaparin (LOVENOX) 80 MG/0.8ML injection Inject 0.65 mLs (65 mg total) into the skin every 12 (twelve) hours. 08/07/16   Cammie Sickle, MD  finasteride (PROSCAR) 5 MG tablet Take 1 tablet (5 mg total) by mouth at bedtime. 09/01/16   Merlyn Lot, MD  glipiZIDE (GLUCOTROL) 5 MG tablet Take 1 tablet (5 mg total) by mouth 2 (two) times daily before a meal.  07/01/16   Roselee Nova, MD  lisinopril (PRINIVIL,ZESTRIL) 10 MG tablet TAKE ONE TABLET BY MOUTH ONCE DAILY Patient not taking: Reported on 08/19/2016 06/15/16   Roselee Nova, MD  metFORMIN (GLUCOPHAGE) 1000 MG tablet Take 0.5 tablets (500 mg total) by mouth 2 (two) times daily. 07/01/16   Roselee Nova, MD  ondansetron (ZOFRAN) 8 MG tablet Take 1 tablet (8 mg total) by mouth every 8 (eight) hours as needed for nausea or vomiting (start 3 days; after chemo). 06/26/16   Cammie Sickle, MD  prochlorperazine (COMPAZINE) 10 MG tablet Take 1 tablet (10 mg total) by mouth every 6 (six) hours as needed for nausea or vomiting. 06/26/16   Cammie Sickle, MD  tamsulosin (FLOMAX) 0.4 MG CAPS capsule Take 0.4 mg by mouth every morning.  01/24/15   Historical Provider, MD    Allergies Patient has no known allergies.    Social History Social History  Substance Use Topics  . Smoking status: Never Smoker  . Smokeless tobacco: Never Used  . Alcohol use No    Review of Systems Patient denies headaches, rhinorrhea, blurry vision, numbness, shortness of breath, chest pain, edema, cough, abdominal pain, nausea, vomiting, diarrhea, dysuria, fevers, rashes or hallucinations unless otherwise stated above in HPI. ____________________________________________   PHYSICAL EXAM:  VITAL SIGNS: Vitals:   09/01/16 1228  BP: (!) 144/91  Pulse: 77  Resp: 18  Temp: 98 F (36.7 C)    Constitutional: Alert and oriented. Well appearing and in no acute distress. Eyes: Conjunctivae are normal. PERRL. EOMI. Head: Atraumatic. Nose: No congestion/rhinnorhea. Mouth/Throat: Mucous membranes are moist.  Oropharynx non-erythematous. Neck: No stridor. Painless ROM. No cervical spine tenderness to palpation Hematological/Lymphatic/Immunilogical: No cervical lymphadenopathy. Cardiovascular: Normal rate, regular rhythm. Grossly normal heart sounds.  Good peripheral circulation. Respiratory: Normal  respiratory effort.  No retractions. Lungs CTAB. Gastrointestinal: Soft and nontender. No distention. No abdominal bruits. No CVA tenderness. Musculoskeletal: No lower extremity tenderness nor edema.  No joint effusions. Neurologic:  Normal speech and language. No gross focal neurologic deficits are appreciated. No gait instability. Skin:  Skin is warm, dry and intact. No rash noted. Psychiatric: Mood and affect are normal. Speech and behavior are normal.  ____________________________________________   LABS (all labs ordered are listed, but only abnormal results are displayed)  Results for orders placed or performed during the hospital encounter of 09/01/16 (from the past 24 hour(s))  Urinalysis, Complete w Microscopic     Status: Abnormal   Collection Time: 09/01/16 12:30 PM  Result Value Ref Range   Color, Urine RED (A) YELLOW   APPearance TURBID (A) CLEAR   Specific Gravity, Urine 1.010 1.005 - 1.030   pH  5.0 - 8.0    TEST NOT REPORTED DUE TO COLOR INTERFERENCE OF URINE PIGMENT   Glucose, UA (A) NEGATIVE mg/dL    TEST NOT REPORTED DUE TO COLOR INTERFERENCE OF URINE PIGMENT   Hgb urine dipstick (A) NEGATIVE  TEST NOT REPORTED DUE TO COLOR INTERFERENCE OF URINE PIGMENT   Bilirubin Urine (A) NEGATIVE    TEST NOT REPORTED DUE TO COLOR INTERFERENCE OF URINE PIGMENT   Ketones, ur (A) NEGATIVE mg/dL    TEST NOT REPORTED DUE TO COLOR INTERFERENCE OF URINE PIGMENT   Protein, ur (A) NEGATIVE mg/dL    TEST NOT REPORTED DUE TO COLOR INTERFERENCE OF URINE PIGMENT   Nitrite (A) NEGATIVE    TEST NOT REPORTED DUE TO COLOR INTERFERENCE OF URINE PIGMENT   Leukocytes, UA (A) NEGATIVE    TEST NOT REPORTED DUE TO COLOR INTERFERENCE OF URINE PIGMENT   RBC / HPF TOO NUMEROUS TO COUNT 0 - 5 RBC/hpf   WBC, UA NONE SEEN 0 - 5 WBC/hpf   Bacteria, UA NONE SEEN NONE SEEN   Squamous Epithelial / LPF NONE SEEN NONE SEEN  CBC     Status: Abnormal   Collection Time: 09/01/16 12:30 PM  Result Value Ref  Range   WBC 4.8 3.8 - 10.6 K/uL   RBC 4.46 4.40 - 5.90 MIL/uL   Hemoglobin 12.7 (L) 13.0 - 18.0 g/dL   HCT 37.8 (L) 40.0 - 52.0 %   MCV 84.6 80.0 - 100.0 fL   MCH 28.4 26.0 - 34.0 pg   MCHC 33.6 32.0 - 36.0 g/dL   RDW 19.1 (H) 11.5 - 14.5 %   Platelets 302 150 - 440 K/uL  Basic metabolic panel     Status: Abnormal   Collection Time: 09/01/16 12:30 PM  Result Value Ref Range   Sodium 132 (L) 135 - 145 mmol/L   Potassium 4.2 3.5 - 5.1 mmol/L   Chloride 103 101 - 111 mmol/L   CO2 20 (L) 22 - 32 mmol/L   Glucose, Bld 179 (H) 65 - 99 mg/dL   BUN 13 6 - 20 mg/dL   Creatinine, Ser 1.31 (H) 0.61 - 1.24 mg/dL   Calcium 9.2 8.9 - 10.3 mg/dL   GFR calc non Af Amer 56 (L) >60 mL/min   GFR calc Af Amer >60 >60 mL/min   Anion gap 9 5 - 15  Protime-INR     Status: None   Collection Time: 09/01/16 12:30 PM  Result Value Ref Range   Prothrombin Time 14.6 11.4 - 15.2 seconds   INR 1.13   APTT     Status: Abnormal   Collection Time: 09/01/16 12:30 PM  Result Value Ref Range   aPTT 39 (H) 24 - 36 seconds   ____________________________________________  EKG____________________________________________  RADIOLOGY  I personally reviewed all radiographic images ordered to evaluate for the above acute complaints and reviewed radiology reports and findings.  These findings were personally discussed with the patient.  Please see medical record for radiology report.   ____________________________________________   PROCEDURES  Procedure(s) performed:  Procedures    Critical Care performed: no ____________________________________________   INITIAL IMPRESSION / ASSESSMENT AND PLAN / ED COURSE  Pertinent labs & imaging results that were available during my care of the patient were reviewed by me and considered in my medical decision making (see chart for details).  DDX: stone, bph, supratherapeutic lovenox, mass  Melvin Willis is a 65 y.o. who presents to the ED with hematuria on  Lovenox. Patient is afebrile hemodynamic stable. CT imaging ordered due to history of stone. Patient without any signs or symptoms of her acute blood loss anemia. He has no flank pain. Blood work is otherwise reassuring and at baseline.  Clinical Course as of Dec  [place] Sep 01, 2016  1546 Radiology reports Markedly enlarged prostate with bilateral nonobstructing stones. Patient remains a symptomatically and hemodynamically stable. We'll trial of voiding trial.  [PR]  3403 The patient was able to void without any competitions. His exam remains benign. Spoke with Dr. Noah Delaine of urology who recommends restarting the patient on Proscar and follow-up in clinic as the patient is able to void. Discussed signs and symptoms for which the patient should return. He has no evidence of acute infectious process.  Have discussed with the patient and available family all diagnostics and treatments performed thus far and all questions were answered to the best of my ability. The patient demonstrates understanding and agreement with plan.   [PR]    Clinical Course User Index [PR] Merlyn Lot, MD     ____________________________________________   FINAL CLINICAL IMPRESSION(S) / ED DIAGNOSES  Final diagnoses:  Hematuria, unspecified type  Prostate disease      NEW MEDICATIONS STARTED DURING THIS VISIT:  Current Discharge Medication List       Note:  This document was prepared using Dragon voice recognition software and may include unintentional dictation errors.    Merlyn Lot, MD 09/01/16 [phone]

## 2016-09-01 NOTE — ED Triage Notes (Addendum)
States blood in his urine for 2 days, denies any pain, states hx of kidney stones, states he takes lovenox for hx of PE

## 2016-09-01 NOTE — ED Notes (Signed)
Pt discharged home after verbalizing understanding of discharge instructions; nad noted. 

## 2016-09-08 ENCOUNTER — Telehealth: Payer: Self-pay | Admitting: *Deleted

## 2016-09-08 ENCOUNTER — Other Ambulatory Visit: Payer: Self-pay | Admitting: *Deleted

## 2016-09-08 DIAGNOSIS — C3412 Malignant neoplasm of upper lobe, left bronchus or lung: Secondary | ICD-10-CM

## 2016-09-08 DIAGNOSIS — I2699 Other pulmonary embolism without acute cor pulmonale: Secondary | ICD-10-CM

## 2016-09-08 MED ORDER — ENOXAPARIN SODIUM 80 MG/0.8ML ~~LOC~~ SOLN: 65.0000 mg | Freq: Two times a day (BID) | SUBCUTANEOUS | 6 refills | Status: DC

## 2016-09-08 NOTE — Telephone Encounter (Signed)
RF sent to pharmacy per v/o MD.  Yes- aware of chg of insurance as Pt dropped off new insurance card last week and it was scanned to the chart.

## 2016-09-08 NOTE — Telephone Encounter (Signed)
Asking if we are aware his insurance changed 09/07/16 and that he only has enough Lovenox for 1 more day. Please return his call about this. His new RX card is scanned in to the media section

## 2016-09-16 ENCOUNTER — Other Ambulatory Visit: Payer: Self-pay | Admitting: *Deleted

## 2016-09-16 DIAGNOSIS — C3412 Malignant neoplasm of upper lobe, left bronchus or lung: Secondary | ICD-10-CM

## 2016-09-16 DIAGNOSIS — I2699 Other pulmonary embolism without acute cor pulmonale: Secondary | ICD-10-CM

## 2016-09-16 MED ORDER — ENOXAPARIN SODIUM 80 MG/0.8ML ~~LOC~~ SOLN: 65.0000 mg | Freq: Two times a day (BID) | SUBCUTANEOUS | 6 refills | Status: DC

## 2016-09-16 MED ORDER — RIVAROXABAN (XARELTO) VTE STARTER PACK (15 & 20 MG): ORAL_TABLET | ORAL | 0 refills | Status: DC

## 2016-09-16 NOTE — Telephone Encounter (Signed)
States insurance will not cover any more, please check into this

## 2016-09-16 NOTE — Telephone Encounter (Signed)
I advised patient of change in treatment due to needing to be on something now and his insurance asking for a PA. He asked if his insurance is going to cover the Xarelto. I told him to check with. His pharmacy for that

## 2016-09-16 NOTE — Telephone Encounter (Signed)
Insurance will not cover patient's lovenox. - spoke with md - chg rx to xarelto starter pack.  rx faxed to [facility] on [address].  Contacted patient and asked patient to contact our office asap to discuss a new rx that is being sent to his pharmacy that he needs to start on today. Explained that his Insurance will not cover his lovenox. Requested call back from patient asap.

## 2016-09-16 NOTE — Telephone Encounter (Signed)
Per Dr. Jacinto Reap- provide patient with xarelto starter pack.

## 2016-09-16 NOTE — Telephone Encounter (Signed)
Returned call to patient. He states that his insurance "will no longer cover the lovenox" I asked the patient is he provided [facility] with the new card. He said yes, he did.  I explained to patient that I would call [facility] to determine the status of his claim.  RN Spoke with Tiffany at [facility] on [address]. Card exp. 12/30. This was the card from last year.  Per [facility] on [address]- pt last rf his rx for lovenox in November at this location. appears that patient receives his rfs at [facility] on [address] location. Tiffany will pend the RX so that [facility] on [address] could rf this rx.  I contacted patient back to confirm this. He stated that he went to [facility] on [address] last night and gave them insurance card to this location. contacted [facility] on [address] -   "oer pt's insurance - pt has maxed out of the refills - 35 days worth per 180 days." I asked [facility] if this should start over for the calender year. She states that the only insurance card they have on file is with [employer]. I explained that the patient has a copy of an oputum rx prescription card and has aenta. She asked that I fax this insurance card to [facility] and they will run it through the new prescription insurance and see what information they get. I provided [facility] with my personal number in cancer center so that [facility] could call me back on updates. I explained the necessity of pt  receiving lovenox injections and that pt is out of the drug.

## 2016-09-17 ENCOUNTER — Ambulatory Visit: Payer: Managed Care, Other (non HMO)

## 2016-09-17 NOTE — Telephone Encounter (Signed)
[address]. Pt has not yet picked up rx for xarelto starter pack at pharmacy. Per [facility]- pt's copay is $60 for starter pack.  Patient has not yet picked up starter pack samples in cancer center. I contacted Elease Etienne this morning to discuss patient's financial situation with the lovenox.  Per pharmacy at [facility]- pt's lovenox requires a prior auth. Given that the patient is out, Dr. Rogue Bussing states that he would like to switch pt to xarelto starter pack asap.  This will allow ample time to explore options for lovenox. Rn contacted Elease Etienne, social worker- Barnabas Lister approved xarelto at [facility] [employer] (if needed) to cover the cost of the copay. Pt will not need this at this time, as cancer ctr has a starter pack. Pt contacted again at 934 am- he agrees to come to cancer center asap to pick up starter pack. Discussed side effects of xarelto with patient via telephone. Will provide patient with written instructions on dosing.   Pt arrived at 1100-picked up xarelto samples. Teach back process performed with patient on xarelto instructions.

## 2016-09-18 ENCOUNTER — Emergency Department
Admission: EM | Admit: 2016-09-18 | Discharge: 2016-09-18 | Disposition: A | Discharge status: Home / Self Care | Payer: Managed Care, Other (non HMO) | Attending: Emergency Medicine | Admitting: Emergency Medicine

## 2016-09-18 ENCOUNTER — Telehealth: Payer: Self-pay | Admitting: *Deleted

## 2016-09-18 ENCOUNTER — Encounter: Payer: Self-pay | Admitting: Emergency Medicine

## 2016-09-18 DIAGNOSIS — E119 Type 2 diabetes mellitus without complications: Secondary | ICD-10-CM | POA: Insufficient documentation

## 2016-09-18 DIAGNOSIS — I10 Essential (primary) hypertension: Secondary | ICD-10-CM | POA: Diagnosis not present

## 2016-09-18 DIAGNOSIS — Z79899 Other long term (current) drug therapy: Secondary | ICD-10-CM | POA: Diagnosis not present

## 2016-09-18 DIAGNOSIS — Z85118 Personal history of other malignant neoplasm of bronchus and lung: Secondary | ICD-10-CM | POA: Insufficient documentation

## 2016-09-18 DIAGNOSIS — R310 Gross hematuria: Secondary | ICD-10-CM | POA: Diagnosis not present

## 2016-09-18 DIAGNOSIS — Z7984 Long term (current) use of oral hypoglycemic drugs: Secondary | ICD-10-CM | POA: Insufficient documentation

## 2016-09-18 DIAGNOSIS — R319 Hematuria, unspecified: Secondary | ICD-10-CM | POA: Diagnosis present

## 2016-09-18 DIAGNOSIS — Z7982 Long term (current) use of aspirin: Secondary | ICD-10-CM | POA: Insufficient documentation

## 2016-09-18 LAB — URINALYSIS, COMPLETE (UACMP) WITH MICROSCOPIC
Bacteria, UA: NONE SEEN
SPECIFIC GRAVITY, URINE: 1.014 (ref 1.005–1.030)
Squamous Epithelial / LPF: NONE SEEN

## 2016-09-18 LAB — BASIC METABOLIC PANEL
Anion gap: 8 (ref 5–15)
BUN: 18 mg/dL (ref 6–20)
CALCIUM: 9.2 mg/dL (ref 8.9–10.3)
CO2: 21 mmol/L — ABNORMAL LOW (ref 22–32)
Chloride: 106 mmol/L (ref 101–111)
Creatinine, Ser: 1.36 mg/dL — ABNORMAL HIGH (ref 0.61–1.24)
GFR calc Af Amer: >60 mL/min (ref >60)
GFR, EST NON AFRICAN AMERICAN: 54 mL/min — AB (ref >60)
Glucose, Bld: 254 mg/dL — ABNORMAL HIGH (ref 65–99)
Potassium: 4 mmol/L (ref 3.5–5.1)
Sodium: 135 mmol/L (ref 135–145)

## 2016-09-18 LAB — CBC
HCT: 32.2 % — ABNORMAL LOW (ref 40.0–52.0)
Hemoglobin: 11 g/dL — ABNORMAL LOW (ref 13.0–18.0)
MCH: 29.6 pg (ref 26.0–34.0)
MCHC: 34.1 g/dL (ref 32.0–36.0)
MCV: 86.6 fL (ref 80.0–100.0)
PLATELETS: 349 10*3/uL (ref 150–440)
RBC: 3.72 MIL/uL — ABNORMAL LOW (ref 4.40–5.90)
RDW: 18.6 % — AB (ref 11.5–14.5)
WBC: 4.2 10*3/uL (ref 3.8–10.6)

## 2016-09-18 MED ORDER — FOLIC ACID 1 MG PO TABS: 1.0000 mg | ORAL_TABLET | Freq: Every day | ORAL | 3 refills | Status: DC

## 2016-09-18 MED ORDER — IRON (FERROUS GLUCONATE) 256 (28 FE) MG PO TABS: 256.0000 mg | ORAL_TABLET | ORAL | 1 refills | Status: DC

## 2016-09-18 MED ORDER — VITAMIN B-12 100 MCG PO TABS
100.0000 ug | ORAL_TABLET | Freq: Three times a day (TID) | ORAL | 1 refills | Status: DC
Start: 2016-09-18 — End: 2016-12-25

## 2016-09-18 NOTE — ED Triage Notes (Signed)
Pt ambulatory to triage in NAD, report hematuria x 20 days and recently has had some weakness.  Reports currently on xarelto.  PT denies pain.

## 2016-09-18 NOTE — Discharge Instructions (Signed)
I spoke to Dr.Cope at great length about you. He says he needs to follow-up with your oncologist. He needs U to be on his low-dose of anticoagulants as possible. He reports he is not too worried about your blood in your urine unless your passing a lot of clots frequently. I would follow-up with your oncologist and/or Dr. cope at least once a week as long as you're having a lot of bleeding. They may want to check your blood count. I will give you a prescription for iron folate and vitamin B12 should help replace your blood supply. Remember the iron can make your stool black and it can make you somewhat constipated. The B12 can make you or your and yellow if the blood clears out of it. Please return if you get weak or lightheaded or you cannot follow up with your doctors and you have not been able to get your blood count checked or if you have a lot of clots coming out.

## 2016-09-18 NOTE — ED Notes (Signed)
Pt states unable to give urine sample at this time 

## 2016-09-18 NOTE — ED Provider Notes (Signed)
[facility] Emergency Department Provider Note   ____________________________________________   First MD Initiated Contact with Patient 09/18/16 1933     (approximate)  I have reviewed the triage vital signs and the nursing notes.   HISTORY  Chief Complaint Hematuria    HPI Melvin Willis is a 66 y.o. male patient sees Dr. Maree Krabbe urologist. Patient reports he's had hematuria for 20 days. He had numbness and tingling of his feet and hands a couple days ago when he woke up in the morning. He does not have that now. He is worried and came in for check. He has no weakness no nausea vomiting diarrhea belly or back pain   Past Medical History:  Diagnosis Date  . Abnormal prostate specific antigen 08/08/2012  . Adiposity 04/16/2015  . CVA (cerebral vascular accident) ([place]) 06/17/2016  . Diabetes mellitus without complication ([place])   . Diverticulosis of sigmoid colon 04/16/2015  . Dyslipidemia 03/18/2015  . Hemorrhoids, internal 04/16/2015  . Hypercholesteremia 04/16/2015  . Hyperlipidemia   . Hypertension   . Primary cancer of left upper lobe of lung ([place])   . Pulmonary embolism ([place])   . Wears dentures    partial upper    Patient Active Problem List   Diagnosis Date Noted  . Acute ischemic stroke ([place]) 06/25/2016  . Primary malignant neoplasm of lung metastatic to other site ([place]) 06/24/2016  . Elevated troponin 06/24/2016  . Acute cerebral infarction ([place])   . Blurred vision, bilateral 06/23/2016  . Primary cancer of left upper lobe of lung ([place]) 06/12/2016  . Mediastinal mass   . Chest pain 06/03/2016  . DVT (deep venous thrombosis) ([place]) 06/03/2016  . PE (pulmonary thromboembolism) ([place]) 06/03/2016  . Hilar mass 06/03/2016  . Pharyngeal dysphagia 01/01/2016  . Benign neoplasm of ascending colon   . Benign neoplasm of sigmoid colon   . Benign neoplasm of transverse colon   . Diverticulosis of large intestine without diverticulitis   .  Diverticulosis of sigmoid colon 04/16/2015  . Hypercholesteremia 04/16/2015  . Adiposity 04/16/2015  . Diabetes mellitus without complication ([place]) 26/83/4196  . Dyslipidemia 03/18/2015  . Disorder of male genital organ 08/08/2012  . Abnormal prostate specific antigen 08/08/2012    Past Surgical History:  Procedure Laterality Date  . COLONOSCOPY    . COLONOSCOPY WITH PROPOFOL N/A 05/06/2015   Procedure: COLONOSCOPY WITH PROPOFOL;  Surgeon: Lucilla Lame, MD;  Location: [facility];  Service: Endoscopy;  Laterality: N/A;  ASCENDING COLON POLYPS X 2 TERMINAL ILEUM BIOPSY RANDOM COLON BX. TRANSVERSE COLON POLYP SIGMOID COLON POLYP  . ESOPHAGOGASTRODUODENOSCOPY (EGD) WITH PROPOFOL N/A 05/06/2015   Procedure: ESOPHAGOGASTRODUODENOSCOPY (EGD) WITH PROPOFOL;  Surgeon: Lucilla Lame, MD;  Location: [facility];  Service: Endoscopy;  Laterality: N/A;  GASTRIC BIOPSY X1    Prior to Admission medications   Medication Sig Start Date End Date Taking? Authorizing Provider  aspirin EC 81 MG EC tablet Take 1 tablet (81 mg total) by mouth daily. 06/26/16   Srikar Sudini, MD  atorvastatin (LIPITOR) 40 MG tablet Take 1 tablet (40 mg total) by mouth daily. 07/29/16   Roselee Nova, MD  crizotinib Hulda Humphrey) 250 MG capsule Take 1 capsule (250 mg total) by mouth 2 (two) times daily. 06/19/16   Cammie Sickle, MD  enoxaparin (LOVENOX) 80 MG/0.8ML injection Inject 0.65 mLs (65 mg total) into the skin every 12 (twelve) hours. 09/16/16   Cammie Sickle, MD  finasteride (PROSCAR) 5 MG tablet Take 1  tablet (5 mg total) by mouth at bedtime. 09/01/16   Merlyn Lot, MD  folic acid (FOLVITE) 1 MG tablet Take 1 tablet (1 mg total) by mouth daily. 09/18/16 09/18/17  Nena Polio, MD  glipiZIDE (GLUCOTROL) 5 MG tablet Take 1 tablet (5 mg total) by mouth 2 (two) times daily before a meal. 07/01/16   Roselee Nova, MD  Iron, Ferrous Gluconate, 256 (28 Fe) MG TABS Take 256 mg by mouth 1 day  or 1 dose. 09/18/16 09/19/16  Nena Polio, MD  lisinopril (PRINIVIL,ZESTRIL) 10 MG tablet TAKE ONE TABLET BY MOUTH ONCE DAILY Patient not taking: Reported on 08/19/2016 06/15/16   Roselee Nova, MD  metFORMIN (GLUCOPHAGE) 1000 MG tablet Take 0.5 tablets (500 mg total) by mouth 2 (two) times daily. 07/01/16   Roselee Nova, MD  ondansetron (ZOFRAN) 8 MG tablet Take 1 tablet (8 mg total) by mouth every 8 (eight) hours as needed for nausea or vomiting (start 3 days; after chemo). 06/26/16   Cammie Sickle, MD  prochlorperazine (COMPAZINE) 10 MG tablet Take 1 tablet (10 mg total) by mouth every 6 (six) hours as needed for nausea or vomiting. 06/26/16   Cammie Sickle, MD  Rivaroxaban 15 & 20 MG TBPK Take as directed on package: Start with one '15mg'$  tablet by mouth twice a day with food. On Day 22, switch to one '20mg'$  tablet once a day with food. 09/16/16   Cammie Sickle, MD  tamsulosin (FLOMAX) 0.4 MG CAPS capsule Take 0.4 mg by mouth every morning.  01/24/15   Historical Provider, MD  vitamin B-12 (CYANOCOBALAMIN) 100 MCG tablet Take 1 tablet (100 mcg total) by mouth 3 (three) times daily. 09/18/16 09/18/17  Nena Polio, MD    Allergies Patient has no known allergies.  Family History  Problem Relation Age of Onset  . Diabetes Mother   . Diabetes Father   . CAD Father   . Dementia Father   . Diabetes Sister   . Cancer Maternal Uncle     Prostate  . Cancer Cousin     prostate    Social History Social History  Substance Use Topics  . Smoking status: Never Smoker  . Smokeless tobacco: Never Used  . Alcohol use No    Review of Systems Constitutional: No fever/chills Eyes: No visual changes. ENT: No sore throat. Cardiovascular: Denies chest pain. Respiratory: Denies shortness of breath. Gastrointestinal: No abdominal pain.  No nausea, no vomiting.  No diarrhea.  No constipation. Genitourinary: Negative for dysuria. Musculoskeletal: Negative for back  pain. Skin: Negative for rash. Neurological: Negative for headaches, focal weakness or numbnessAt present.  10-point ROS otherwise negative.  ____________________________________________   PHYSICAL EXAM:  VITAL SIGNS: ED Triage Vitals [09/18/16 1615]  Enc Vitals Group     BP (!) 167/75     Pulse Rate 79     Resp 18     Temp 98.4 F (36.9 C)     Temp Source Oral     SpO2 100 %     Weight 148 lb (67.1 kg)     Height '5\' 4"'$  (1.626 m)     Head Circumference      Peak Flow      Pain Score 0     Pain Loc      Pain Edu?      Excl. in [place]?     Constitutional: Alert and oriented. Well appearing and in no acute distress. Eyes:  Conjunctivae are normal. PERRL. EOMI. Head: Atraumatic. Nose: No congestion/rhinnorhea. Mouth/Throat: Mucous membranes are moist.  Oropharynx non-erythematous. Neck: No stridor. Cardiovascular: Normal rate, regular rhythm. Grossly normal heart sounds.  Good peripheral circulation. Respiratory: Normal respiratory effort.  No retractions. Lungs CTAB. Gastrointestinal: Soft and nontender. No distention. No abdominal bruits. No CVA tenderness. Musculoskeletal: No lower extremity tenderness nor edema.  No joint effusions. Neurologic:  Normal speech and language. No gross focal neurologic deficits are appreciated. No gait instability. Skin:  Skin is warm, dry and intact. No rash noted.   ____________________________________________   LABS (all labs ordered are listed, but only abnormal results are displayed)  Labs Reviewed  URINALYSIS, COMPLETE (UACMP) WITH MICROSCOPIC - Abnormal; Notable for the following:       Result Value   Color, Urine RED (*)    APPearance CLOUDY (*)    Glucose, UA   (*)    Value: TEST NOT REPORTED DUE TO COLOR INTERFERENCE OF URINE PIGMENT   Hgb urine dipstick   (*)    Value: TEST NOT REPORTED DUE TO COLOR INTERFERENCE OF URINE PIGMENT   Bilirubin Urine   (*)    Value: TEST NOT REPORTED DUE TO COLOR INTERFERENCE OF URINE PIGMENT    Ketones, ur   (*)    Value: TEST NOT REPORTED DUE TO COLOR INTERFERENCE OF URINE PIGMENT   Protein, ur   (*)    Value: TEST NOT REPORTED DUE TO COLOR INTERFERENCE OF URINE PIGMENT   Nitrite   (*)    Value: TEST NOT REPORTED DUE TO COLOR INTERFERENCE OF URINE PIGMENT   Leukocytes, UA   (*)    Value: TEST NOT REPORTED DUE TO COLOR INTERFERENCE OF URINE PIGMENT   All other components within normal limits  CBC - Abnormal; Notable for the following:    RBC 3.72 (*)    Hemoglobin 11.0 (*)    HCT 32.2 (*)    RDW 18.6 (*)    All other components within normal limits  BASIC METABOLIC PANEL - Abnormal; Notable for the following:    CO2 21 (*)    Glucose, Bld 254 (*)    Creatinine, Ser 1.36 (*)    GFR calc non Af Amer 54 (*)    All other components within normal limits   ____________________________________________  EKG   ____________________________________________  RADIOLOGY   ____________________________________________   PROCEDURES  Procedure(s) performed:   Procedures  Critical Care performed:   ____________________________________________   INITIAL IMPRESSION / ASSESSMENT AND PLAN / ED COURSE  Pertinent labs & imaging results that were available during my care of the patient were reviewed by me and considered in my medical decision making (see chart for details).    Clinical Course     Discussed in detail with Dr. cope his urologist. His urologist recommended she follow up with his oncologist to see if he can decrease the anticoagulant dose at all. Dr. [name] says that he cannot do anything about the hematuria unless the patient is off anticoagulants. He is not too worried about the hematuria unless there are a lot of clots coming out. ____________________________________________   FINAL CLINICAL IMPRESSION(S) / ED DIAGNOSES  Final diagnoses:  Gross hematuria      NEW MEDICATIONS STARTED DURING THIS VISIT:  New Prescriptions   FOLIC ACID (FOLVITE) 1  MG TABLET    Take 1 tablet (1 mg total) by mouth daily.   IRON, FERROUS GLUCONATE, 256 (28 FE) MG TABS    Take 256 mg by  mouth 1 day or 1 dose.   VITAMIN B-12 (CYANOCOBALAMIN) 100 MCG TABLET    Take 1 tablet (100 mcg total) by mouth 3 (three) times daily.     Note:  This document was prepared using Dragon voice recognition software and may include unintentional dictation errors.    Nena Polio, MD 09/18/16 [phone]

## 2016-09-18 NOTE — ED Notes (Signed)
E signature pad in room not working at this time. Pt verbalized no further questions and that he will follow up with urologist as scheduled.

## 2016-09-18 NOTE — Telephone Encounter (Signed)
Patient called to report that he forgot to mention that he has had "bllod in my urine for about 20 days and now I am having tingling and numbness in my arms and legs". Discussed with [name] and patient was advised to go to the ER right now. He stated he will go

## 2016-09-19 ENCOUNTER — Ambulatory Visit
Admission: RE | Admit: 2016-09-19 | Discharge: 2016-09-19 | Disposition: A | Discharge status: Home / Self Care | Payer: Managed Care, Other (non HMO) | Source: Ambulatory Visit | Attending: Internal Medicine | Admitting: Internal Medicine

## 2016-09-19 DIAGNOSIS — Z8673 Personal history of transient ischemic attack (TIA), and cerebral infarction without residual deficits: Secondary | ICD-10-CM | POA: Insufficient documentation

## 2016-09-19 DIAGNOSIS — I639 Cerebral infarction, unspecified: Secondary | ICD-10-CM

## 2016-09-19 DIAGNOSIS — C3490 Malignant neoplasm of unspecified part of unspecified bronchus or lung: Secondary | ICD-10-CM | POA: Insufficient documentation

## 2016-09-19 DIAGNOSIS — C3412 Malignant neoplasm of upper lobe, left bronchus or lung: Secondary | ICD-10-CM

## 2016-09-19 MED ORDER — GADOBENATE DIMEGLUMINE 529 MG/ML IV SOLN
15.0000 mL | Freq: Once | INTRAVENOUS | Status: AC | PRN
  Administered 2016-09-19: 13 mL via INTRAVENOUS

## 2016-09-21 ENCOUNTER — Ambulatory Visit: Admission: RE | Admit: 2016-09-21 | Payer: Managed Care, Other (non HMO) | Source: Ambulatory Visit

## 2016-09-21 ENCOUNTER — Ambulatory Visit
Admission: RE | Admit: 2016-09-21 | Discharge: 2016-09-21 | Disposition: A | Discharge status: Home / Self Care | Payer: Managed Care, Other (non HMO) | Source: Ambulatory Visit | Attending: Internal Medicine | Admitting: Internal Medicine

## 2016-09-21 DIAGNOSIS — R590 Localized enlarged lymph nodes: Secondary | ICD-10-CM | POA: Diagnosis not present

## 2016-09-21 DIAGNOSIS — C3412 Malignant neoplasm of upper lobe, left bronchus or lung: Secondary | ICD-10-CM | POA: Diagnosis present

## 2016-09-21 DIAGNOSIS — I639 Cerebral infarction, unspecified: Secondary | ICD-10-CM

## 2016-09-23 ENCOUNTER — Inpatient Hospital Stay: Payer: Managed Care, Other (non HMO) | Admitting: Internal Medicine

## 2016-09-29 ENCOUNTER — Inpatient Hospital Stay: Payer: Managed Care, Other (non HMO) | Attending: Internal Medicine | Admitting: Internal Medicine

## 2016-09-29 VITALS — BP 135/69 | HR 69 | Temp 97.8°F | Wt 156.0 lb

## 2016-09-29 DIAGNOSIS — N19 Unspecified kidney failure: Secondary | ICD-10-CM | POA: Diagnosis not present

## 2016-09-29 DIAGNOSIS — Z9221 Personal history of antineoplastic chemotherapy: Secondary | ICD-10-CM | POA: Insufficient documentation

## 2016-09-29 DIAGNOSIS — I10 Essential (primary) hypertension: Secondary | ICD-10-CM

## 2016-09-29 DIAGNOSIS — Z86711 Personal history of pulmonary embolism: Secondary | ICD-10-CM | POA: Diagnosis not present

## 2016-09-29 DIAGNOSIS — E669 Obesity, unspecified: Secondary | ICD-10-CM | POA: Insufficient documentation

## 2016-09-29 DIAGNOSIS — E785 Hyperlipidemia, unspecified: Secondary | ICD-10-CM | POA: Insufficient documentation

## 2016-09-29 DIAGNOSIS — E119 Type 2 diabetes mellitus without complications: Secondary | ICD-10-CM

## 2016-09-29 DIAGNOSIS — R972 Elevated prostate specific antigen [PSA]: Secondary | ICD-10-CM

## 2016-09-29 DIAGNOSIS — I2510 Atherosclerotic heart disease of native coronary artery without angina pectoris: Secondary | ICD-10-CM

## 2016-09-29 DIAGNOSIS — Z8673 Personal history of transient ischemic attack (TIA), and cerebral infarction without residual deficits: Secondary | ICD-10-CM

## 2016-09-29 DIAGNOSIS — Z79899 Other long term (current) drug therapy: Secondary | ICD-10-CM | POA: Insufficient documentation

## 2016-09-29 DIAGNOSIS — C770 Secondary and unspecified malignant neoplasm of lymph nodes of head, face and neck: Secondary | ICD-10-CM | POA: Diagnosis not present

## 2016-09-29 DIAGNOSIS — K449 Diaphragmatic hernia without obstruction or gangrene: Secondary | ICD-10-CM | POA: Insufficient documentation

## 2016-09-29 DIAGNOSIS — K648 Other hemorrhoids: Secondary | ICD-10-CM

## 2016-09-29 DIAGNOSIS — K769 Liver disease, unspecified: Secondary | ICD-10-CM | POA: Insufficient documentation

## 2016-09-29 DIAGNOSIS — R590 Localized enlarged lymph nodes: Secondary | ICD-10-CM

## 2016-09-29 DIAGNOSIS — Z8042 Family history of malignant neoplasm of prostate: Secondary | ICD-10-CM | POA: Insufficient documentation

## 2016-09-29 DIAGNOSIS — E7800 Pure hypercholesterolemia, unspecified: Secondary | ICD-10-CM | POA: Insufficient documentation

## 2016-09-29 DIAGNOSIS — C3412 Malignant neoplasm of upper lobe, left bronchus or lung: Secondary | ICD-10-CM

## 2016-09-29 DIAGNOSIS — R5383 Other fatigue: Secondary | ICD-10-CM | POA: Insufficient documentation

## 2016-09-29 DIAGNOSIS — Z7984 Long term (current) use of oral hypoglycemic drugs: Secondary | ICD-10-CM | POA: Insufficient documentation

## 2016-09-29 DIAGNOSIS — N179 Acute kidney failure, unspecified: Secondary | ICD-10-CM

## 2016-09-29 DIAGNOSIS — Z86718 Personal history of other venous thrombosis and embolism: Secondary | ICD-10-CM

## 2016-09-29 DIAGNOSIS — K5730 Diverticulosis of large intestine without perforation or abscess without bleeding: Secondary | ICD-10-CM

## 2016-09-29 NOTE — Progress Notes (Signed)
[place] NOTE  Patient Care Team: Roselee Nova, MD as PCP - General (Family Medicine)  CHIEF COMPLAINTS/PURPOSE OF CONSULTATION:  Lung cancer  #  Oncology History   # OCT 2017- ADENO CA LUNG; STAGE IV [; LUL; bil supraclavicular LN; Left neck LN Bx]; ROS-1 MUTATED; s/p Carbo-alimta x1[oct 2017]  # NOV 1st 2017- XALKORI 250 mg BID; JAN 15th CT- PR  # LLE DVT/bil PE/Multiple strokes [? On xarelto]-Lovenox; Jan mid 2018- xarelto [lovenox-insurance issues]  # MRI brain- multiple infarcts [2d echo/bubble study-NEG's/p Neurology eval]  # # s/p TURP [Sep 2017; Dr.Cope] DEC 26th CT- distended bladder   # MOLECULAR TESTING- ROS-1 POSITIVE; ALK/EGFR-NEG; PDL-1 EXPRESSION- 90%** [HIGH]     Primary cancer of left upper lobe of lung ([place])   06/12/2016 Initial Diagnosis    Primary cancer of left upper lobe of lung ([place])       HISTORY OF PRESENTING ILLNESS:  Melvin Willis 66 y.o.  male with newly diagnosedMetastatic lung cancer ROS- positive; Multiple strokes secondary to his metastatic lung cancer- Crizotinib  And xarelto [swicthed from lovenox because of insurance issues] is here for follow-up/ review the results of the CT scan/ MRI brain.  In the interim patient was evaluated by urology- for recent hematuria; status post cystoscopy.   Denies any headaches. Appetite improving. No weight loss. Hematuria has resolved.  Otherwise no vomiting no diarrhea no constipation. Denies any bleeding. Denies any worse and shortness of breath or cough.  ROS: A complete 10 point review of system is done which is negative except mentioned above in history of present illness  MEDICAL HISTORY:  Past Medical History:  Diagnosis Date  . Abnormal prostate specific antigen 08/08/2012  . Adiposity 04/16/2015  . CVA (cerebral vascular accident) ([place]) 06/17/2016  . Diabetes mellitus without complication ([place])   . Diverticulosis of sigmoid colon 04/16/2015  . Dyslipidemia  03/18/2015  . Hemorrhoids, internal 04/16/2015  . Hypercholesteremia 04/16/2015  . Hyperlipidemia   . Hypertension   . Primary cancer of left upper lobe of lung ([place])   . Pulmonary embolism ([place])   . Wears dentures    partial upper    SURGICAL HISTORY: Past Surgical History:  Procedure Laterality Date  . COLONOSCOPY    . COLONOSCOPY WITH PROPOFOL N/A 05/06/2015   Procedure: COLONOSCOPY WITH PROPOFOL;  Surgeon: Lucilla Lame, MD;  Location: [facility];  Service: Endoscopy;  Laterality: N/A;  ASCENDING COLON POLYPS X 2 TERMINAL ILEUM BIOPSY RANDOM COLON BX. TRANSVERSE COLON POLYP SIGMOID COLON POLYP  . ESOPHAGOGASTRODUODENOSCOPY (EGD) WITH PROPOFOL N/A 05/06/2015   Procedure: ESOPHAGOGASTRODUODENOSCOPY (EGD) WITH PROPOFOL;  Surgeon: Lucilla Lame, MD;  Location: [place];  Service: Endoscopy;  Laterality: N/A;  GASTRIC BIOPSY X1    SOCIAL HISTORY: Social History   Social History  . Marital status: Married    Spouse name: N/A  . Number of children: N/A  . Years of education: N/A   Occupational History  . Not on file.   Social History Main Topics  . Smoking status: Never Smoker  . Smokeless tobacco: Never Used  . Alcohol use No  . Drug use: No  . Sexual activity: Yes    Partners: Female   Other Topics Concern  . Not on file   Social History Narrative  . No narrative on file    FAMILY HISTORY: Family History  Problem Relation Age of Onset  . Diabetes Mother   . Diabetes Father   . CAD  Father   . Dementia Father   . Diabetes Sister   . Cancer Maternal Uncle     Prostate  . Cancer Cousin     prostate    ALLERGIES:  has No Known Allergies.  MEDICATIONS:  Current Outpatient Prescriptions  Medication Sig Dispense Refill  . atorvastatin (LIPITOR) 40 MG tablet Take 1 tablet (40 mg total) by mouth daily. 90 tablet 0  . crizotinib (XALKORI) 250 MG capsule Take 1 capsule (250 mg total) by mouth 2 (two) times daily. 60 capsule 4  . finasteride  (PROSCAR) 5 MG tablet Take 1 tablet (5 mg total) by mouth at bedtime. 14 tablet 0  . folic acid (FOLVITE) 1 MG tablet Take 1 tablet (1 mg total) by mouth daily. 30 tablet 3  . glipiZIDE (GLUCOTROL) 5 MG tablet Take 1 tablet (5 mg total) by mouth 2 (two) times daily before a meal. 180 tablet 0  . metFORMIN (GLUCOPHAGE) 1000 MG tablet Take 0.5 tablets (500 mg total) by mouth 2 (two) times daily. 90 tablet 0  . ondansetron (ZOFRAN) 8 MG tablet Take 1 tablet (8 mg total) by mouth every 8 (eight) hours as needed for nausea or vomiting (start 3 days; after chemo). 40 tablet 1  . prochlorperazine (COMPAZINE) 10 MG tablet Take 1 tablet (10 mg total) by mouth every 6 (six) hours as needed for nausea or vomiting. 40 tablet 1  . Rivaroxaban 15 & 20 MG TBPK Take as directed on package: Start with one 31m tablet by mouth twice a day with food. On Day 22, switch to one 273mtablet once a day with food. 51 each 0  . tamsulosin (FLOMAX) 0.4 MG CAPS capsule Take 0.4 mg by mouth every morning.     . vitamin B-12 (CYANOCOBALAMIN) 100 MCG tablet Take 1 tablet (100 mcg total) by mouth 3 (three) times daily. 100 tablet 1  . Iron, Ferrous Gluconate, 256 (28 Fe) MG TABS Take 256 mg by mouth 1 day or 1 dose. 30 tablet 1   No current facility-administered medications for this visit.       . Marland KitchenPHYSICAL EXAMINATION: ECOG PERFORMANCE STATUS: 2 - Symptomatic, <50% confined to bed  Vitals:   09/29/16 0918  BP: 135/69  Pulse: 69  Temp: 97.8 F (36.6 C)   Filed Weights   09/29/16 0918  Weight: 155 lb 15.6 oz (70.8 kg)    GENERAL: Well-nourished well-developed; Alert, no distress and comfortable.   With his wife.  EYES: no pallor or icterus OROPHARYNX: no thrush or ulceration; good dentition  NECK: supple, no masses felt LYMPH:  no palpable lymphadenopathy in the cervical, axillary or inguinal regions LUNGS: clear to auscultation and  No wheeze or crackles HEART/CVS: regular rate & rhythm and no murmurs; No  lower extremity edema ABDOMEN: abdomen soft, non-tender and normal bowel sounds Musculoskeletal:no cyanosis of digits and no clubbing  PSYCH: alert & oriented x 3 with fluent speech NEURO: no focal motor/sensory deficits SKIN:  no rashes or significant lesions  LABORATORY DATA:  I have reviewed the data as listed Lab Results  Component Value Date   WBC 4.2 09/18/2016   HGB 11.0 (L) 09/18/2016   HCT 32.2 (L) 09/18/2016   MCV 86.6 09/18/2016   PLT 349 09/18/2016    Recent Labs  07/08/16 1635 07/13/16 1315  08/19/16 1132 09/01/16 1230 09/18/16 1615  NA 136 133*  < > 134* 132* 135  K 4.4 4.6  < > 4.2 4.2 4.0  CL 104  101  < > 102 103 106  CO2 26 25  < > 23 20* 21*  GLUCOSE 157* 279*  < > 236* 179* 254*  BUN 11 13  < > '19 13 18  ' CREATININE 1.12 1.12  < > 1.58* 1.31* 1.36*  CALCIUM 9.6 9.6  < > 9.1 9.2 9.2  GFRNONAA >60 >60  < > 45* 56* 54*  GFRAA >60 >60  < > 52* >60 >60  PROT 7.3 7.2  --  7.1  --   --   ALBUMIN 3.8 3.9  --  3.5  --   --   AST 60* 46*  --  30  --   --   ALT 154* 143*  --  57  --   --   ALKPHOS 76 81  --  86  --   --   BILITOT <0.1* 0.2*  --  0.4  --   --   < > = values in this interval not displayed.  RADIOGRAPHIC STUDIES: I have personally reviewed the radiological images as listed and agreed with the findings in the report. Ct Chest Wo Contrast  Result Date: 09/22/2016 CLINICAL DATA:  Lung cancer. EXAM: CT CHEST WITHOUT CONTRAST TECHNIQUE: Multidetector CT imaging of the chest was performed following the standard protocol without IV contrast. COMPARISON:  06/03/2016 FINDINGS: Cardiovascular: Heart size upper normal. Coronary artery calcification is noted. No pericardial effusion. Mediastinum/Nodes: Prevascular lymphadenopathy has decreased in the interval. 19 mm short axis prevascular lymph node seen previously is 15 mm short axis today (image 42 series 2). 9 mm short axis prevascular lymph node seen on image 38 series 2 today was 12 mm short axis when I  remeasure it on the prior study. A high right paratracheal lymph node that was 12 mm (remeasured) on the prior study has resolved completely in the interval. The 2.4 cm short axis subcarinal lymph node measured on the prior study has essentially resolved, measuring 6 mm short axis today (image 59 series 2). No bulky hilar lymphadenopathy identified although lack of intravenous contrast limits assessment. The esophagus has normal imaging features. Lungs/Pleura: The left upper lobe collapse/ consolidation seen on the previous study has decreased in the interval with much of the left upper lobe now aerated. There is a persistent 3.1 x 5.1 cm area of masslike collapse/consolidation in the medial left upper lobe. This lesion obliterates the left upper lobe bronchus. Calcified granulomata are identified in the right lung. No suspicious right pulmonary nodule or mass. Upper Abdomen: Cluster of water density lesions in the dome of the left liver, unchanged in the interval and likely representing cyst. Other scattered low-density liver lesions are also stable. Visualized portions of the adrenal glands are unremarkable. Musculoskeletal: Bone windows reveal no worrisome lytic or sclerotic osseous lesions. IMPRESSION: 1. Variable interval response to therapy. Some of the relatively bulky mediastinal lymphadenopathy seen previously has resolved completely while other enlarged mediastinal lymph nodes show a more modest interval decrease in size. 2. Interval improved aeration in the left upper lobe with persistent masslike medial left upper lobe consolidation and obliteration of the left upper lobe bronchus. 3. Coronary artery atherosclerosis. 4. Stable hypodense liver lesions, likely representing cysts. Electronically Signed   By: Misty Stanley M.D.   On: 09/22/2016 08:16   Mr Jeri Cos HU Contrast  Result Date: 09/19/2016 CLINICAL DATA:  Lung cancer.  Recent cerebral infarcts. EXAM: MRI HEAD WITHOUT AND WITH CONTRAST  TECHNIQUE: Multiplanar, multiecho pulse sequences of the brain and surrounding  structures were obtained without and with intravenous contrast. CONTRAST:  43m MULTIHANCE GADOBENATE DIMEGLUMINE 529 MG/ML IV SOLN COMPARISON:  CT head without contrast 06/24/2016 FINDINGS: Brain: A remote hemorrhagic infarct is evident within the right parietal lobe. There is cortical laminar necrosis. A remote nonhemorrhagic infarct in the left parietal lobe is within a similar location. Additional water sided territory infarcts are noted bilaterally. No acute cortical infarct is present. There is minimal other white matter disease. The brainstem is normal. A remote lacunar infarct is present in the right cerebellum. Vascular: Flow is present in the major intracranial arteries. Skull and upper cervical spine: The skullbase is within normal limits. The craniocervical junction is normal. Midline sagittal structures are unremarkable. Marrow signal is normal. Sinuses/Orbits: Beat paranasal sinuses are clear. A right mastoid effusion is present. No obstructing nasopharyngeal lesion is evident. The globes and orbits are within normal limits. IMPRESSION: 1. Expected evolution of bilateral parietal lobe infarcts. 2. Remote blood products associated with the right parietal infarcts. 3. Additional remote watershed territory infarcts more prominent right than left are also noted. 4. No acute infarct. Electronically Signed   By: CSan MorelleM.D.   On: 09/19/2016 13:36   Ct Renal Stone Study  Result Date: 09/01/2016 CLINICAL DATA:  Blood in urine for 2 days EXAM: CT ABDOMEN AND PELVIS WITHOUT CONTRAST TECHNIQUE: Multidetector CT imaging of the abdomen and pelvis was performed following the standard protocol without IV contrast. COMPARISON:  06/04/2016 FINDINGS: Lower chest: Lung bases shows no focal abnormality. Small hiatal hernia. Small lower mediastinal lymph nodes are noted the largest axial image 10 measures 1.1 cm borderline by  size criteria. Hepatobiliary: Again noted scattered hepatic cysts the largest in left hepatic lobe measures 2.5 cm. Pancreas: Unenhanced pancreas with normal appearance. Spleen: Unenhanced spleen is normal. Adrenals/Urinary Tract: No adrenal gland mass. Unenhanced kidneys shows small punctate nonobstructive calyceal calcifications. The largest in midpole of the left kidney measures 1.2 mm. No hydronephrosis or hydroureter. No calcified ureteral calculi are noted. There is significant distended urinary bladder. The bladder measures 12.7 cm cranial caudally with the tip at the level of umbilicus. AP diameter measures 8.2 cm. Stomach/Bowel: No small bowel obstruction. Normal appendix. No pericecal inflammation. No thickened or dilated small bowel loops. Vascular/Lymphatic: No aortic aneurysm. Atherosclerotic calcifications are noted distal abdominal aorta. Small nonspecific retroperitoneal lymph nodes are noted. No significant adenopathy is noted Reproductive: There is mild greatly enlarged prostate gland with multinodular appearance and indentation of urinary bladder base. Correlation with urology exam is recommended. Prostate gland measures at least 7.8 cm cranial caudally by a 7.2 cm transverse diameter. There is a probable cyst in upper pole of the left kidney measures 1.2 cm. Other: No ascites or free abdominal air. Small nonspecific bilateral inguinal lymph nodes are noted. Musculoskeletal: No destructive bony lesions are noted. Sagittal images of the spine shows disc space flattening at L5-S1 level with vacuum disc phenomenon. IMPRESSION: 1. Question punctate bilateral nonobstructive nephrolithiasis. No hydronephrosis or hydroureter. 2. Normal appendix.  No pericecal inflammation. 3. There is significant enlarged multinodular prostate gland with polypoid indentation of urinary bladder base. Prostate gland measures at least 7.8 x 7.2 cm. Correlation with urology exam is recommended. 4. Significant distended  urinary bladder measures at least 12.7 cm x 8.2 cm. No calcified calculi are noted within urinary bladder. 5. No small bowel obstruction. 6. Degenerative changes lumbar spine at L5-S1 level. Electronically Signed   By: LLahoma CrockerM.D.   On: 09/01/2016 15:37  ASSESSMENT & PLAN:   Primary cancer of left upper lobe of lung ([place]) # Adenocarcinoma of the lung metastatic/stage IV- bilateral supraclavicular adenopathy; ROS-1 POSITIVE. Currently on Crizotinib 250 mg BID [since Nov]; Tolerating well except for mild fatigue. CT scan 09/21/2016 shows partial response- improvement of the neck adenopathy mediastinal adenopathy.  # MRI brain- multiple strokes; sec to malignancy. Patient switched from Lovenox to Xarelto mid-January 2018 because of insurance issues. January 15 MRI shows old bilateral strokes no evidence of any malignancy. Continue the same.  # Bladder distention/ prostatomegaly/ recent hematuria- resolved. Await to discuss with Dr. [name].   #  Renal failure- creatinine 1.3/improving Continue to hold off lisinopril. Recent CT a/p- showed bladder distention/ enlarged bladder. Left message for Dr. [name] to discuss. Refer to Nephrology.   # diabetes- continue metformin once a day; and Glucotrol.   # Bilateral PE left lower extremity DVT- on xarelto.   # fatigue question from low testosterone. Awaiting testosterone levels from today.   # Follow up in 4 weeks/labs.   # I reviewed the blood work- with the patient in detail; also reviewed the imaging independently [as summarized above]; and with the patient in detail.   # 40 minutes face-to-face with the patient discussing the above plan of care; more than 50% of time spent on prognosis/ natural history; counseling and coordination.     Cammie Sickle, MD 09/29/2016 10:24 AM

## 2016-09-29 NOTE — Progress Notes (Signed)
Patient here today for follow up.  Patient states no new concerns today  

## 2016-09-29 NOTE — Assessment & Plan Note (Addendum)
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 Currently on Crizotinib 250 mg BID [since Nov]; Tolerating well except for mild fatigue. CT scan 09/21/2016 shows partial response- improvement of the neck adenopathy mediastinal adenopathy. Continue Crizotinib for now.   # MRI brain- multiple strokes; sec to malignancy. Patient switched from Lovenox to Xarelto mid-January 2018 because of insurance issues. January 15 MRI shows old bilateral strokes no evidence of any malignancy. Continue the same.  # Bladder distention/ prostatomegaly/ recent hematuria- resolved. Await to discuss with Dr. [name].   #  Renal failure- creatinine 1.3/improving Continue to hold off lisinopril. Recent CT a/p- showed bladder distention/ enlarged bladder. Left message for Dr. [name] to discuss. Refer to Nephrology.   # diabetes- continue metformin once a day; and Glucotrol.   # Bilateral PE left lower extremity DVT- on xarelto.   # fatigue question from low testosterone. Awaiting testosterone levels from today.   # Follow up in 4 weeks/labs.   # I reviewed the blood work- with the patient in detail; also reviewed the imaging independently [as summarized above]; and with the patient in detail.   # 40 minutes face-to-face with the patient discussing the above plan of care; more than 50% of time spent on prognosis/ natural history; counseling and coordination.

## 2016-09-30 ENCOUNTER — Telehealth: Payer: Self-pay | Admitting: *Deleted

## 2016-09-30 NOTE — Telephone Encounter (Signed)
Leanord Asal, RN case manager at [employer] called and left vm on clinical hotline as well as faxed a request for medical records. Case manager requesting md last office note. Will fwd request to our cancer ctr medical records release.  Phone [phone]

## 2016-10-01 ENCOUNTER — Encounter: Payer: Self-pay | Admitting: Family Medicine

## 2016-10-01 ENCOUNTER — Ambulatory Visit (INDEPENDENT_AMBULATORY_CARE_PROVIDER_SITE_OTHER): Payer: Managed Care, Other (non HMO) | Admitting: Family Medicine

## 2016-10-01 VITALS — BP 122/70 | HR 73 | Temp 98.3°F | Resp 16 | Ht 64.0 in | Wt 152.0 lb

## 2016-10-01 DIAGNOSIS — E119 Type 2 diabetes mellitus without complications: Secondary | ICD-10-CM

## 2016-10-01 DIAGNOSIS — E7800 Pure hypercholesterolemia, unspecified: Secondary | ICD-10-CM | POA: Diagnosis not present

## 2016-10-01 DIAGNOSIS — C3412 Malignant neoplasm of upper lobe, left bronchus or lung: Secondary | ICD-10-CM | POA: Diagnosis not present

## 2016-10-01 LAB — GLUCOSE, POCT (MANUAL RESULT ENTRY): POC Glucose: 176 mg/dl — AB (ref 70–99)

## 2016-10-01 LAB — POCT GLYCOSYLATED HEMOGLOBIN (HGB A1C): HEMOGLOBIN A1C: 6.3

## 2016-10-01 MED ORDER — ATORVASTATIN CALCIUM 40 MG PO TABS: 40.0000 mg | ORAL_TABLET | Freq: Every day | ORAL | 0 refills | Status: DC

## 2016-10-01 NOTE — Progress Notes (Signed)
Name: Melvin Willis   MRN: [id number]    DOB: [birth year]   Date:10/01/2016       Progress Note  Subjective  Chief Complaint  Chief Complaint  Patient presents with  . Diabetes  . Hypertension    Diabetes  He presents for his follow-up diabetic visit. He has type 2 diabetes mellitus. His disease course has been stable. Pertinent negatives for diabetes include no fatigue, no polydipsia and no polyuria. Pertinent negatives for diabetic complications include no heart disease or peripheral neuropathy. Current diabetic treatment includes oral agent (monotherapy). He rarely participates in exercise. His breakfast blood glucose range is generally 110-130 mg/dl. An ACE inhibitor/angiotensin II receptor blocker is not being taken.  Hyperlipidemia  This is a chronic problem. The problem is controlled. Recent lipid tests were reviewed and are normal. Pertinent negatives include no leg pain, myalgias or shortness of breath. Current antihyperlipidemic treatment includes statins. Risk factors for coronary artery disease include diabetes mellitus, dyslipidemia and male sex.     Past Medical History:  Diagnosis Date  . Abnormal prostate specific antigen 08/08/2012  . Adiposity 04/16/2015  . CVA (cerebral vascular accident) ([place]) 06/17/2016  . Diabetes mellitus without complication ([place])   . Diverticulosis of sigmoid colon 04/16/2015  . Dyslipidemia 03/18/2015  . Hemorrhoids, internal 04/16/2015  . Hypercholesteremia 04/16/2015  . Hyperlipidemia   . Hypertension   . Primary cancer of left upper lobe of lung ([place])   . Pulmonary embolism ([place])   . Wears dentures    partial upper    Past Surgical History:  Procedure Laterality Date  . COLONOSCOPY    . COLONOSCOPY WITH PROPOFOL N/A 05/06/2015   Procedure: COLONOSCOPY WITH PROPOFOL;  Surgeon: Lucilla Lame, MD;  Location: [facility];  Service: Endoscopy;  Laterality: N/A;  ASCENDING COLON POLYPS X 2 TERMINAL ILEUM BIOPSY RANDOM COLON  BX. TRANSVERSE COLON POLYP SIGMOID COLON POLYP  . ESOPHAGOGASTRODUODENOSCOPY (EGD) WITH PROPOFOL N/A 05/06/2015   Procedure: ESOPHAGOGASTRODUODENOSCOPY (EGD) WITH PROPOFOL;  Surgeon: Lucilla Lame, MD;  Location: [facility];  Service: Endoscopy;  Laterality: N/A;  GASTRIC BIOPSY X1    Family History  Problem Relation Age of Onset  . Diabetes Mother   . Diabetes Father   . CAD Father   . Dementia Father   . Diabetes Sister   . Cancer Maternal Uncle     Prostate  . Cancer Cousin     prostate    Social History   Social History  . Marital status: Married    Spouse name: N/A  . Number of children: N/A  . Years of education: N/A   Occupational History  . Not on file.   Social History Main Topics  . Smoking status: Never Smoker  . Smokeless tobacco: Never Used  . Alcohol use No  . Drug use: No  . Sexual activity: Yes    Partners: Female   Other Topics Concern  . Not on file   Social History Narrative  . No narrative on file     Current Outpatient Prescriptions:  .  atorvastatin (LIPITOR) 40 MG tablet, Take 1 tablet (40 mg total) by mouth daily., Disp: 90 tablet, Rfl: 0 .  crizotinib (XALKORI) 250 MG capsule, Take 1 capsule (250 mg total) by mouth 2 (two) times daily., Disp: 60 capsule, Rfl: 4 .  finasteride (PROSCAR) 5 MG tablet, Take 1 tablet (5 mg total) by mouth at bedtime., Disp: 14 tablet, Rfl: 0 .  folic acid (FOLVITE) 1 MG tablet, Take 1  tablet (1 mg total) by mouth daily., Disp: 30 tablet, Rfl: 3 .  glipiZIDE (GLUCOTROL) 5 MG tablet, Take 1 tablet (5 mg total) by mouth 2 (two) times daily before a meal., Disp: 180 tablet, Rfl: 0 .  metFORMIN (GLUCOPHAGE) 1000 MG tablet, Take 0.5 tablets (500 mg total) by mouth 2 (two) times daily., Disp: 90 tablet, Rfl: 0 .  ondansetron (ZOFRAN) 8 MG tablet, Take 1 tablet (8 mg total) by mouth every 8 (eight) hours as needed for nausea or vomiting (start 3 days; after chemo)., Disp: 40 tablet, Rfl: 1 .  prochlorperazine  (COMPAZINE) 10 MG tablet, Take 1 tablet (10 mg total) by mouth every 6 (six) hours as needed for nausea or vomiting., Disp: 40 tablet, Rfl: 1 .  Rivaroxaban 15 & 20 MG TBPK, Take as directed on package: Start with one '15mg'$  tablet by mouth twice a day with food. On Day 22, switch to one '20mg'$  tablet once a day with food., Disp: 51 each, Rfl: 0 .  tamsulosin (FLOMAX) 0.4 MG CAPS capsule, Take 0.4 mg by mouth every morning. , Disp: , Rfl:  .  vitamin B-12 (CYANOCOBALAMIN) 100 MCG tablet, Take 1 tablet (100 mcg total) by mouth 3 (three) times daily., Disp: 100 tablet, Rfl: 1 .  Iron, Ferrous Gluconate, 256 (28 Fe) MG TABS, Take 256 mg by mouth 1 day or 1 dose., Disp: 30 tablet, Rfl: 1  No Known Allergies   Review of Systems  Constitutional: Negative for fatigue.  Respiratory: Negative for shortness of breath.   Musculoskeletal: Negative for myalgias.  Endo/Heme/Allergies: Negative for polydipsia.    Objective  Vitals:   10/01/16 0831  BP: 122/70  Pulse: 73  Resp: 16  Temp: 98.3 F (36.8 C)  TempSrc: Oral  SpO2: 98%  Weight: 152 lb (68.9 kg)  Height: '5\' 4"'$  (1.626 m)    Physical Exam  Constitutional: He is oriented to person, place, and time and well-developed, well-nourished, and in no distress.  HENT:  Head: Normocephalic and atraumatic.  Cardiovascular: Normal rate, regular rhythm and normal heart sounds.   No murmur heard. Pulmonary/Chest: Effort normal and breath sounds normal. He has no wheezes.  Musculoskeletal: He exhibits no edema.  Neurological: He is alert and oriented to person, place, and time.  Psychiatric: Mood, memory, affect and judgment normal.  Nursing note and vitals reviewed.    Assessment & Plan  1. Type 2 diabetes mellitus without complication, without long-term current use of insulin (HCC) Point-of-care A1c 6.3%, well-controlled diabetes, patient is now taking metformin 500 mg daily, which is appropriate given his A1c and sent weight loss due to  malignancy. Recheck in 3 months - POCT HgB A1C - POCT Glucose (CBG)  2. Hypercholesteremia  - atorvastatin (LIPITOR) 40 MG tablet; Take 1 tablet (40 mg total) by mouth daily at 6 PM.  Dispense: 90 tablet; Refill: 0  3. Primary cancer of left upper lobe of lung [facility]) Being followed by hematology, notes reviewed.   Sadira Standard [facility] 10/01/2016 8:53 AM

## 2016-10-06 ENCOUNTER — Telehealth: Payer: Self-pay | Admitting: *Deleted

## 2016-10-06 DIAGNOSIS — C3412 Malignant neoplasm of upper lobe, left bronchus or lung: Secondary | ICD-10-CM

## 2016-10-06 MED ORDER — NYSTATIN 100000 UNIT/ML MT SUSP: 5.0000 mL | Freq: Four times a day (QID) | OROMUCOSAL | 0 refills | Status: DC

## 2016-10-06 NOTE — Telephone Encounter (Signed)
Has noted taste alterations and also that he has a "film on his tongue which is white. Please advise

## 2016-10-06 NOTE — Telephone Encounter (Signed)
Patient advised that Rx was sent to [facility]

## 2016-10-06 NOTE — Telephone Encounter (Signed)
Spoke with Dr. Rogue Bussing- rx sent for nystatin mouthwash. pls let pt know.

## 2016-10-15 ENCOUNTER — Other Ambulatory Visit: Payer: Self-pay | Admitting: *Deleted

## 2016-10-15 MED ORDER — RIVAROXABAN 20 MG PO TABS: 20.0000 mg | ORAL_TABLET | Freq: Every day | ORAL | 5 refills | Status: DC

## 2016-10-15 NOTE — Telephone Encounter (Signed)
Please submit refill for Xarelto

## 2016-10-15 NOTE — Telephone Encounter (Signed)
Rx sent to [facility]

## 2016-10-27 ENCOUNTER — Ambulatory Visit: Payer: Managed Care, Other (non HMO) | Admitting: Internal Medicine

## 2016-10-27 ENCOUNTER — Other Ambulatory Visit: Payer: Managed Care, Other (non HMO)

## 2016-10-28 ENCOUNTER — Inpatient Hospital Stay: Payer: Managed Care, Other (non HMO) | Attending: Internal Medicine | Admitting: Internal Medicine

## 2016-10-28 ENCOUNTER — Inpatient Hospital Stay: Payer: Managed Care, Other (non HMO)

## 2016-10-28 VITALS — BP 147/68 | HR 62 | Temp 98.0°F | Wt 157.2 lb

## 2016-10-28 DIAGNOSIS — N183 Chronic kidney disease, stage 3 (moderate): Secondary | ICD-10-CM | POA: Insufficient documentation

## 2016-10-28 DIAGNOSIS — C3412 Malignant neoplasm of upper lobe, left bronchus or lung: Secondary | ICD-10-CM | POA: Diagnosis not present

## 2016-10-28 DIAGNOSIS — E7800 Pure hypercholesterolemia, unspecified: Secondary | ICD-10-CM | POA: Insufficient documentation

## 2016-10-28 DIAGNOSIS — Z86718 Personal history of other venous thrombosis and embolism: Secondary | ICD-10-CM | POA: Diagnosis not present

## 2016-10-28 DIAGNOSIS — K648 Other hemorrhoids: Secondary | ICD-10-CM

## 2016-10-28 DIAGNOSIS — R42 Dizziness and giddiness: Secondary | ICD-10-CM | POA: Insufficient documentation

## 2016-10-28 DIAGNOSIS — E119 Type 2 diabetes mellitus without complications: Secondary | ICD-10-CM | POA: Insufficient documentation

## 2016-10-28 DIAGNOSIS — Z9221 Personal history of antineoplastic chemotherapy: Secondary | ICD-10-CM | POA: Diagnosis not present

## 2016-10-28 DIAGNOSIS — Z7901 Long term (current) use of anticoagulants: Secondary | ICD-10-CM | POA: Insufficient documentation

## 2016-10-28 DIAGNOSIS — Z8041 Family history of malignant neoplasm of ovary: Secondary | ICD-10-CM | POA: Diagnosis not present

## 2016-10-28 DIAGNOSIS — I129 Hypertensive chronic kidney disease with stage 1 through stage 4 chronic kidney disease, or unspecified chronic kidney disease: Secondary | ICD-10-CM | POA: Diagnosis not present

## 2016-10-28 DIAGNOSIS — Z7984 Long term (current) use of oral hypoglycemic drugs: Secondary | ICD-10-CM | POA: Insufficient documentation

## 2016-10-28 DIAGNOSIS — C770 Secondary and unspecified malignant neoplasm of lymph nodes of head, face and neck: Secondary | ICD-10-CM | POA: Diagnosis not present

## 2016-10-28 DIAGNOSIS — Z79899 Other long term (current) drug therapy: Secondary | ICD-10-CM | POA: Insufficient documentation

## 2016-10-28 DIAGNOSIS — Z8673 Personal history of transient ischemic attack (TIA), and cerebral infarction without residual deficits: Secondary | ICD-10-CM | POA: Insufficient documentation

## 2016-10-28 DIAGNOSIS — E785 Hyperlipidemia, unspecified: Secondary | ICD-10-CM

## 2016-10-28 DIAGNOSIS — R5383 Other fatigue: Secondary | ICD-10-CM | POA: Insufficient documentation

## 2016-10-28 DIAGNOSIS — Z86711 Personal history of pulmonary embolism: Secondary | ICD-10-CM | POA: Diagnosis not present

## 2016-10-28 LAB — CBC WITH DIFFERENTIAL/PLATELET
BASOS ABS: 0 10*3/uL (ref 0–0.1)
Basophils Relative: 1 %
Eosinophils Absolute: 0.3 10*3/uL (ref 0–0.7)
Eosinophils Relative: 9 %
HEMATOCRIT: 35 % — AB (ref 40.0–52.0)
HEMOGLOBIN: 11.7 g/dL — AB (ref 13.0–18.0)
LYMPHS PCT: 34 %
Lymphs Abs: 1.3 10*3/uL (ref 1.0–3.6)
MCH: 28.2 pg (ref 26.0–34.0)
MCHC: 33.5 g/dL (ref 32.0–36.0)
MCV: 84.1 fL (ref 80.0–100.0)
MONO ABS: 0.8 10*3/uL (ref 0.2–1.0)
MONOS PCT: 22 %
NEUTROS ABS: 1.3 10*3/uL — AB (ref 1.4–6.5)
NEUTROS PCT: 34 %
Platelets: 351 10*3/uL (ref 150–440)
RBC: 4.16 MIL/uL — ABNORMAL LOW (ref 4.40–5.90)
RDW: 15.4 % — ABNORMAL HIGH (ref 11.5–14.5)
WBC: 3.8 10*3/uL (ref 3.8–10.6)

## 2016-10-28 LAB — COMPREHENSIVE METABOLIC PANEL
ALK PHOS: 97 U/L (ref 38–126)
ALT: 56 U/L (ref 17–63)
AST: 31 U/L (ref 15–41)
Albumin: 3.4 g/dL — ABNORMAL LOW (ref 3.5–5.0)
Anion gap: 6 (ref 5–15)
BILIRUBIN TOTAL: 0.5 mg/dL (ref 0.3–1.2)
BUN: 20 mg/dL (ref 6–20)
CALCIUM: 8.9 mg/dL (ref 8.9–10.3)
CO2: 21 mmol/L — ABNORMAL LOW (ref 22–32)
CREATININE: 1.57 mg/dL — AB (ref 0.61–1.24)
Chloride: 108 mmol/L (ref 101–111)
GFR calc Af Amer: 52 mL/min — ABNORMAL LOW (ref >60)
GFR, EST NON AFRICAN AMERICAN: 45 mL/min — AB (ref >60)
GLUCOSE: 127 mg/dL — AB (ref 65–99)
POTASSIUM: 4.5 mmol/L (ref 3.5–5.1)
Sodium: 135 mmol/L (ref 135–145)
TOTAL PROTEIN: 7 g/dL (ref 6.5–8.1)

## 2016-10-28 NOTE — Progress Notes (Signed)
[place] NOTE  Patient Care Team: Roselee Nova, MD as PCP - General (Family Medicine)  CHIEF COMPLAINTS/PURPOSE OF CONSULTATION:  Lung cancer  #  Oncology History   # OCT 2017- ADENO CA LUNG; STAGE IV [; LUL; bil supraclavicular LN; Left neck LN Bx]; ROS-1 MUTATED; s/p Carbo-alimta x1[oct 2017]  # NOV 1st 2017- XALKORI 250 mg BID; JAN 15th CT- PR  # LLE DVT/bil PE/Multiple strokes [? On xarelto]-Lovenox; Jan mid 2018- xarelto [lovenox-insurance issues]  # MRI brain- multiple infarcts [2d echo/bubble study-NEG's/p Neurology eval]  # # s/p TURP [Sep 2017; Dr.Cope] DEC 26th CT- distended bladder   # MOLECULAR TESTING- ROS-1 POSITIVE; ALK/EGFR-NEG; PDL-1 EXPRESSION- 90%** [HIGH]     Primary cancer of left upper lobe of lung ([place])   06/12/2016 Initial Diagnosis    Primary cancer of left upper lobe of lung ([place])       HISTORY OF PRESENTING ILLNESS:  Melvin Willis 66 y.o.  male with newly diagnosedMetastatic lung cancer ROS- positive; Multiple strokes secondary to his metastatic lung cancer- Crizotinib  And xarelto is here for a follow up.  He states to be compliant with his oral chemotherapy medication and oral blood thinner.  Denies any headaches. Appetite improving. No weight loss. No hematuria  Otherwise no vomiting no diarrhea no constipation Denies any worse and shortness of breath or cough. Patient has episodes of intermittent dizziness; however no falls.  ROS: A complete 10 point review of system is done which is negative except mentioned above in history of present illness  MEDICAL HISTORY:  Past Medical History:  Diagnosis Date  . Abnormal prostate specific antigen 08/08/2012  . Adiposity 04/16/2015  . CVA (cerebral vascular accident) ([place]) 06/17/2016  . Diabetes mellitus without complication ([place])   . Diverticulosis of sigmoid colon 04/16/2015  . Dyslipidemia 03/18/2015  . Hemorrhoids, internal 04/16/2015  . Hypercholesteremia 04/16/2015   . Hyperlipidemia   . Hypertension   . Primary cancer of left upper lobe of lung ([place])   . Pulmonary embolism ([place])   . Wears dentures    partial upper    SURGICAL HISTORY: Past Surgical History:  Procedure Laterality Date  . COLONOSCOPY    . COLONOSCOPY WITH PROPOFOL N/A 05/06/2015   Procedure: COLONOSCOPY WITH PROPOFOL;  Surgeon: Lucilla Lame, MD;  Location: [facility];  Service: Endoscopy;  Laterality: N/A;  ASCENDING COLON POLYPS X 2 TERMINAL ILEUM BIOPSY RANDOM COLON BX. TRANSVERSE COLON POLYP SIGMOID COLON POLYP  . ESOPHAGOGASTRODUODENOSCOPY (EGD) WITH PROPOFOL N/A 05/06/2015   Procedure: ESOPHAGOGASTRODUODENOSCOPY (EGD) WITH PROPOFOL;  Surgeon: Lucilla Lame, MD;  Location: [facility];  Service: Endoscopy;  Laterality: N/A;  GASTRIC BIOPSY X1    SOCIAL HISTORY: Social History   Social History  . Marital status: Married    Spouse name: N/A  . Number of children: N/A  . Years of education: N/A   Occupational History  . Not on file.   Social History Main Topics  . Smoking status: Never Smoker  . Smokeless tobacco: Never Used  . Alcohol use No  . Drug use: No  . Sexual activity: Yes    Partners: Female   Other Topics Concern  . Not on file   Social History Narrative  . No narrative on file    FAMILY HISTORY: Family History  Problem Relation Age of Onset  . Diabetes Mother   . Diabetes Father   . CAD Father   . Dementia Father   . Diabetes  Sister   . Cancer Maternal Uncle     Prostate  . Cancer Cousin     prostate    ALLERGIES:  has No Known Allergies.  MEDICATIONS:  Current Outpatient Prescriptions  Medication Sig Dispense Refill  . atorvastatin (LIPITOR) 40 MG tablet Take 1 tablet (40 mg total) by mouth daily at 6 PM. 90 tablet 0  . crizotinib (XALKORI) 250 MG capsule Take 1 capsule (250 mg total) by mouth 2 (two) times daily. 60 capsule 4  . finasteride (PROSCAR) 5 MG tablet Take 1 tablet (5 mg total) by mouth at bedtime. 14  tablet 0  . folic acid (FOLVITE) 1 MG tablet Take 1 tablet (1 mg total) by mouth daily. 30 tablet 3  . metFORMIN (GLUCOPHAGE) 1000 MG tablet Take 0.5 tablets (500 mg total) by mouth 2 (two) times daily. 90 tablet 0  . nystatin (MYCOSTATIN) 100000 UNIT/ML suspension Take 5 mLs (500,000 Units total) by mouth 4 (four) times daily. 60 mL 0  . ondansetron (ZOFRAN) 8 MG tablet Take 1 tablet (8 mg total) by mouth every 8 (eight) hours as needed for nausea or vomiting (start 3 days; after chemo). 40 tablet 1  . prochlorperazine (COMPAZINE) 10 MG tablet Take 1 tablet (10 mg total) by mouth every 6 (six) hours as needed for nausea or vomiting. 40 tablet 1  . rivaroxaban (XARELTO) 20 MG TABS tablet Take 1 tablet (20 mg total) by mouth daily with supper. 30 tablet 5  . tamsulosin (FLOMAX) 0.4 MG CAPS capsule Take 0.4 mg by mouth every morning.     . vitamin B-12 (CYANOCOBALAMIN) 100 MCG tablet Take 1 tablet (100 mcg total) by mouth 3 (three) times daily. 100 tablet 1  . GLIPIZIDE PO Take by mouth.    . Iron, Ferrous Gluconate, 256 (28 Fe) MG TABS Take 256 mg by mouth 1 day or 1 dose. 30 tablet 1   No current facility-administered medications for this visit.       Marland Kitchen  PHYSICAL EXAMINATION: ECOG PERFORMANCE STATUS: 2 - Symptomatic, <50% confined to bed  Vitals:   10/28/16 1519  BP: (!) 147/68  Pulse: 62  Temp: 98 F (36.7 C)   Filed Weights   10/28/16 1519  Weight: 157 lb 4 oz (71.3 kg)    GENERAL: Well-nourished well-developed; Alert, no distress and comfortable.   With his wife.  EYES: no pallor or icterus OROPHARYNX: no thrush or ulceration; good dentition  NECK: supple, no masses felt LYMPH:  no palpable lymphadenopathy in the cervical, axillary or inguinal regions LUNGS: clear to auscultation and  No wheeze or crackles HEART/CVS: regular rate & rhythm and no murmurs; No lower extremity edema ABDOMEN: abdomen soft, non-tender and normal bowel sounds Musculoskeletal:no cyanosis of digits  and no clubbing  PSYCH: alert & oriented x 3 with fluent speech NEURO: no focal motor/sensory deficits SKIN:  no rashes or significant lesions  LABORATORY DATA:  I have reviewed the data as listed Lab Results  Component Value Date   WBC 3.8 10/28/2016   HGB 11.7 (L) 10/28/2016   HCT 35.0 (L) 10/28/2016   MCV 84.1 10/28/2016   PLT 351 10/28/2016    Recent Labs  07/13/16 1315  08/19/16 1132 09/01/16 1230 09/18/16 1615 10/28/16 1445  NA 133*  < > 134* 132* 135 135  K 4.6  < > 4.2 4.2 4.0 4.5  CL 101  < > 102 103 106 108  CO2 25  < > 23 20* 21* 21*  GLUCOSE  279*  < > 236* 179* 254* 127*  BUN 13  < > _0 CREATININE 1.12  < > 1.58* 1.31* 1.36* 1.57*  CALCIUM 9.6  < > 9.1 9.2 9.2 8.9  GFRNONAA >60  < > 45* 56* 54* 45*  GFRAA >60  < > 52* >60 >60 52*  PROT 7.2  --  7.1  --   --  7.0  ALBUMIN 3.9  --  3.5  --   --  3.4*  AST 46*  --  30  --   --  31  ALT 143*  --  57  --   --  56  ALKPHOS 81  --  86  --   --  97  BILITOT 0.2*  --  0.4  --   --  0.5  < > = values in this interval not displayed.  RADIOGRAPHIC STUDIES: I have personally reviewed the radiological images as listed and agreed with the findings in the report. No results found.  ASSESSMENT & PLAN:   Primary cancer of left upper lobe of lung ([place]) # Adenocarcinoma of the lung metastatic/stage IV- bilateral supraclavicular adenopathy; ROS-1 POSITIVE. Currently on Crizotinib 250 mg BID [since Nov]; CT scan 09/21/2016 shows partial response- improvement of the neck adenopathy mediastinal adenopathy. Tolerating well except for mild fatigue. No concerns for clinical progression at this time. Continue Crizotinib for now.   # MRI brain- multiple strokes; sec to malignancy.on xarelto. No new strokes.  #  Chronic kidney disease stage III- creatinine 1.5/ Stable. S/p urology evaluation; nephrology evaluation. [?sec to Crizotinib]; monitor for now. Discussed with Dr. Cope/ Dr.Lateef.   # diabetes- continue metformin  once a day; and Glucotrol.   # Bilateral PE left lower extremity DVT- on xarelto.   # fatigue question from low testosterone. Awaiting testosterone levels from today.   # Given the intermittent dizzy spells/history of stroke/metastatic incurable lung cancer- I think patient would be eligible for long-term disability. Patient will submit paperwork.   # Follow up in 4 weeks/labs. Will order scans at next visit.      Cammie Sickle, MD 10/28/2016 3:54 PM

## 2016-10-28 NOTE — Assessment & Plan Note (Addendum)
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 Currently on Crizotinib 250 mg BID [since Nov]; CT scan 09/21/2016 shows partial response- improvement of the neck adenopathy mediastinal adenopathy. Tolerating well except for mild fatigue. No concerns for clinical progression at this time. Continue Crizotinib for now.   # MRI brain- multiple strokes; sec to malignancy.on xarelto. No new strokes.  #  Chronic kidney disease stage III- creatinine 1.5/ Stable. S/p urology evaluation; nephrology evaluation. [?sec to Crizotinib]; monitor for now. Discussed with Dr. Cope/ Dr.Lateef.   # diabetes- continue metformin once a day; and Glucotrol.   # Bilateral PE left lower extremity DVT- on xarelto.   # fatigue question from low testosterone. Awaiting testosterone levels from today.   # Given the intermittent dizzy spells/history of stroke/metastatic incurable lung cancer- I think patient would be eligible for long-term disability. Patient will submit paperwork.   # Follow up in 4 weeks/labs. Will order scans at next visit.

## 2016-10-28 NOTE — Progress Notes (Signed)
Patient here today for follow up.   

## 2016-10-29 LAB — TESTOSTERONE: TESTOSTERONE: 214 ng/dL — AB (ref 264–916)

## 2016-11-17 ENCOUNTER — Other Ambulatory Visit: Payer: Self-pay | Admitting: *Deleted

## 2016-11-17 DIAGNOSIS — C3490 Malignant neoplasm of unspecified part of unspecified bronchus or lung: Secondary | ICD-10-CM

## 2016-11-17 MED ORDER — CRIZOTINIB 250 MG PO CAPS
250.0000 mg | ORAL_CAPSULE | Freq: Two times a day (BID) | ORAL | 4 refills | Status: DC
Start: 2016-11-17 — End: 2017-03-03

## 2016-11-17 NOTE — Telephone Encounter (Signed)
[facility] called back and stated he does not have enough refills on this rx to do a months supply and requested new rx be sent which has already been done

## 2016-11-17 NOTE — Telephone Encounter (Signed)
Per [facility] phone # [phone], he has a refill[facility] left on current Rx, someone entered it incorrectly in system and insurance denied it, they will contact patient for delivery. He will need refills after this one

## 2016-11-27 ENCOUNTER — Inpatient Hospital Stay (HOSPITAL_BASED_OUTPATIENT_CLINIC_OR_DEPARTMENT_OTHER): Payer: Managed Care, Other (non HMO) | Admitting: Internal Medicine

## 2016-11-27 ENCOUNTER — Encounter: Payer: Self-pay | Admitting: Internal Medicine

## 2016-11-27 ENCOUNTER — Inpatient Hospital Stay: Payer: Managed Care, Other (non HMO) | Attending: Internal Medicine

## 2016-11-27 VITALS — BP 137/74 | HR 57 | Temp 97.4°F | Resp 16 | Wt 164.5 lb

## 2016-11-27 DIAGNOSIS — Z8042 Family history of malignant neoplasm of prostate: Secondary | ICD-10-CM

## 2016-11-27 DIAGNOSIS — C3412 Malignant neoplasm of upper lobe, left bronchus or lung: Secondary | ICD-10-CM

## 2016-11-27 DIAGNOSIS — E119 Type 2 diabetes mellitus without complications: Secondary | ICD-10-CM | POA: Insufficient documentation

## 2016-11-27 DIAGNOSIS — R5383 Other fatigue: Secondary | ICD-10-CM

## 2016-11-27 DIAGNOSIS — Z7984 Long term (current) use of oral hypoglycemic drugs: Secondary | ICD-10-CM

## 2016-11-27 DIAGNOSIS — N183 Chronic kidney disease, stage 3 unspecified: Secondary | ICD-10-CM | POA: Insufficient documentation

## 2016-11-27 DIAGNOSIS — Z79899 Other long term (current) drug therapy: Secondary | ICD-10-CM | POA: Insufficient documentation

## 2016-11-27 DIAGNOSIS — Z86718 Personal history of other venous thrombosis and embolism: Secondary | ICD-10-CM | POA: Diagnosis not present

## 2016-11-27 DIAGNOSIS — I129 Hypertensive chronic kidney disease with stage 1 through stage 4 chronic kidney disease, or unspecified chronic kidney disease: Secondary | ICD-10-CM | POA: Insufficient documentation

## 2016-11-27 DIAGNOSIS — C779 Secondary and unspecified malignant neoplasm of lymph node, unspecified: Secondary | ICD-10-CM

## 2016-11-27 DIAGNOSIS — R42 Dizziness and giddiness: Secondary | ICD-10-CM | POA: Diagnosis not present

## 2016-11-27 DIAGNOSIS — Z7901 Long term (current) use of anticoagulants: Secondary | ICD-10-CM | POA: Insufficient documentation

## 2016-11-27 DIAGNOSIS — Z8673 Personal history of transient ischemic attack (TIA), and cerebral infarction without residual deficits: Secondary | ICD-10-CM | POA: Diagnosis not present

## 2016-11-27 DIAGNOSIS — E7800 Pure hypercholesterolemia, unspecified: Secondary | ICD-10-CM

## 2016-11-27 HISTORY — DX: Chronic kidney disease, stage 3 unspecified: N18.30

## 2016-11-27 LAB — CBC WITH DIFFERENTIAL/PLATELET
BASOS ABS: 0 10*3/uL (ref 0–0.1)
BASOS PCT: 1 %
Eosinophils Absolute: 0.3 10*3/uL (ref 0–0.7)
Eosinophils Relative: 6 %
HEMATOCRIT: 37.3 % — AB (ref 40.0–52.0)
Hemoglobin: 12.5 g/dL — ABNORMAL LOW (ref 13.0–18.0)
LYMPHS PCT: 33 %
Lymphs Abs: 1.3 10*3/uL (ref 1.0–3.6)
MCH: 27.8 pg (ref 26.0–34.0)
MCHC: 33.6 g/dL (ref 32.0–36.0)
MCV: 82.6 fL (ref 80.0–100.0)
Monocytes Absolute: 0.6 10*3/uL (ref 0.2–1.0)
Monocytes Relative: 16 %
NEUTROS ABS: 1.8 10*3/uL (ref 1.4–6.5)
Neutrophils Relative %: 44 %
Platelets: 306 10*3/uL (ref 150–440)
RBC: 4.52 MIL/uL (ref 4.40–5.90)
RDW: 15.3 % — ABNORMAL HIGH (ref 11.5–14.5)
WBC: 4 10*3/uL (ref 3.8–10.6)

## 2016-11-27 LAB — COMPREHENSIVE METABOLIC PANEL
ALK PHOS: 107 U/L (ref 38–126)
ALT: 55 U/L (ref 17–63)
ANION GAP: 5 (ref 5–15)
AST: 32 U/L (ref 15–41)
Albumin: 3.4 g/dL — ABNORMAL LOW (ref 3.5–5.0)
BILIRUBIN TOTAL: 0.5 mg/dL (ref 0.3–1.2)
BUN: 21 mg/dL — ABNORMAL HIGH (ref 6–20)
CO2: 23 mmol/L (ref 22–32)
Calcium: 9.1 mg/dL (ref 8.9–10.3)
Chloride: 105 mmol/L (ref 101–111)
Creatinine, Ser: 1.69 mg/dL — ABNORMAL HIGH (ref 0.61–1.24)
GFR, EST AFRICAN AMERICAN: 48 mL/min — AB (ref >60)
GFR, EST NON AFRICAN AMERICAN: 41 mL/min — AB (ref >60)
Glucose, Bld: 276 mg/dL — ABNORMAL HIGH (ref 65–99)
Potassium: 4.9 mmol/L (ref 3.5–5.1)
Sodium: 133 mmol/L — ABNORMAL LOW (ref 135–145)
TOTAL PROTEIN: 7 g/dL (ref 6.5–8.1)

## 2016-11-27 NOTE — Progress Notes (Signed)
[place] NOTE  Patient Care Team: Roselee Nova, MD as PCP - General (Family Medicine)  CHIEF COMPLAINTS/PURPOSE OF CONSULTATION:  Lung cancer  #  Oncology History   # OCT 2017- ADENO CA LUNG; STAGE IV [; LUL; bil supraclavicular LN; Left neck LN Bx]; ROS-1 MUTATED; s/p Carbo-alimta x1[oct 2017]  # NOV 1st 2017- XALKORI 250 mg BID; JAN 15th CT- PR  # LLE DVT/bil PE/Multiple strokes [? On xarelto]-Lovenox; Jan mid 2018- xarelto [lovenox-insurance issues]  # MRI brain- multiple infarcts [2d echo/bubble study-NEG's/p Neurology eval]  # # s/p TURP [Sep 2017; Dr.Cope] DEC 26th CT- distended bladder   # MOLECULAR TESTING- ROS-1 POSITIVE; ALK/EGFR-NEG; PDL-1 EXPRESSION- 90%** [HIGH]     Primary cancer of left upper lobe of lung ([place])   06/12/2016 Initial Diagnosis    Primary cancer of left upper lobe of lung ([place])       HISTORY OF PRESENTING ILLNESS:  Melvin Willis 66 y.o.  male with newly diagnosedMetastatic lung cancer ROS- positive; Multiple strokes secondary to his metastatic lung cancer- Crizotinib  And xarelto is here for a follow up.  He denies any unusual headaches. Denies any falls. Does complain of intermittent dizziness. No bleeding. He admits to urinating well. He denies any problems. He is gaining weight. Denies any swelling in legs. Mild fatigue.   He states to be compliant with his oral chemotherapy medication and oral blood thinner.  Otherwise no vomiting no diarrhea no constipation Denies any worse and shortness of breath or cough. Denies any new lumps or bumps.  ROS: A complete 10 point review of system is done which is negative except mentioned above in history of present illness  MEDICAL HISTORY:  Past Medical History:  Diagnosis Date  . Abnormal prostate specific antigen 08/08/2012  . Adiposity 04/16/2015  . Chronic kidney disease (CKD), stage III (moderate) 11/27/2016  . CVA (cerebral vascular accident) ([place]) 06/17/2016  .  Diabetes mellitus without complication ([place])   . Diverticulosis of sigmoid colon 04/16/2015  . Dyslipidemia 03/18/2015  . Hemorrhoids, internal 04/16/2015  . Hypercholesteremia 04/16/2015  . Hyperlipidemia   . Hypertension   . Primary cancer of left upper lobe of lung ([place])   . Pulmonary embolism ([place])   . Wears dentures    partial upper    SURGICAL HISTORY: Past Surgical History:  Procedure Laterality Date  . COLONOSCOPY    . COLONOSCOPY WITH PROPOFOL N/A 05/06/2015   Procedure: COLONOSCOPY WITH PROPOFOL;  Surgeon: Lucilla Lame, MD;  Location: [facility];  Service: Endoscopy;  Laterality: N/A;  ASCENDING COLON POLYPS X 2 TERMINAL ILEUM BIOPSY RANDOM COLON BX. TRANSVERSE COLON POLYP SIGMOID COLON POLYP  . ESOPHAGOGASTRODUODENOSCOPY (EGD) WITH PROPOFOL N/A 05/06/2015   Procedure: ESOPHAGOGASTRODUODENOSCOPY (EGD) WITH PROPOFOL;  Surgeon: Lucilla Lame, MD;  Location: [facility];  Service: Endoscopy;  Laterality: N/A;  GASTRIC BIOPSY X1    SOCIAL HISTORY: Social History   Social History  . Marital status: Married    Spouse name: N/A  . Number of children: N/A  . Years of education: N/A   Occupational History  . Not on file.   Social History Main Topics  . Smoking status: Never Smoker  . Smokeless tobacco: Never Used  . Alcohol use No  . Drug use: No  . Sexual activity: Yes    Partners: Female   Other Topics Concern  . Not on file   Social History Narrative  . No narrative on file  FAMILY HISTORY: Family History  Problem Relation Age of Onset  . Diabetes Mother   . Diabetes Father   . CAD Father   . Dementia Father   . Diabetes Sister   . Cancer Maternal Uncle     Prostate  . Cancer Cousin     prostate    ALLERGIES:  has No Known Allergies.  MEDICATIONS:  Current Outpatient Prescriptions  Medication Sig Dispense Refill  . atorvastatin (LIPITOR) 40 MG tablet Take 1 tablet (40 mg total) by mouth daily at 6 PM. 90 tablet 0  . crizotinib  (XALKORI) 250 MG capsule Take 1 capsule (250 mg total) by mouth 2 (two) times daily. 60 capsule 4  . finasteride (PROSCAR) 5 MG tablet Take 1 tablet (5 mg total) by mouth at bedtime. 14 tablet 0  . GLIPIZIDE PO Take by mouth.    . metFORMIN (GLUCOPHAGE) 1000 MG tablet Take 0.5 tablets (500 mg total) by mouth 2 (two) times daily. 90 tablet 0  . ondansetron (ZOFRAN) 8 MG tablet Take 1 tablet (8 mg total) by mouth every 8 (eight) hours as needed for nausea or vomiting (start 3 days; after chemo). 40 tablet 1  . prochlorperazine (COMPAZINE) 10 MG tablet Take 1 tablet (10 mg total) by mouth every 6 (six) hours as needed for nausea or vomiting. 40 tablet 1  . rivaroxaban (XARELTO) 20 MG TABS tablet Take 1 tablet (20 mg total) by mouth daily with supper. 30 tablet 5  . tamsulosin (FLOMAX) 0.4 MG CAPS capsule Take 0.4 mg by mouth every morning.     . folic acid (FOLVITE) 1 MG tablet Take 1 tablet (1 mg total) by mouth daily. (Patient not taking: Reported on 11/27/2016) 30 tablet 3  . Iron, Ferrous Gluconate, 256 (28 Fe) MG TABS Take 256 mg by mouth 1 day or 1 dose. 30 tablet 1  . vitamin B-12 (CYANOCOBALAMIN) 100 MCG tablet Take 1 tablet (100 mcg total) by mouth 3 (three) times daily. (Patient not taking: Reported on 11/27/2016) 100 tablet 1   No current facility-administered medications for this visit.       Marland Kitchen  PHYSICAL EXAMINATION: ECOG PERFORMANCE STATUS: 2 - Symptomatic, <50% confined to bed  Vitals:   11/27/16 1419  BP: 137/74  Pulse: (!) 57  Resp: 16  Temp: 97.4 F (36.3 C)   Filed Weights   11/27/16 1419  Weight: 164 lb 8 oz (74.6 kg)    GENERAL: Well-nourished well-developed; Alert, no distress and comfortable.   With his wife.  EYES: no pallor or icterus OROPHARYNX: no thrush or ulceration; good dentition  NECK: supple, no masses felt LYMPH:  no palpable lymphadenopathy in the cervical, axillary or inguinal regions LUNGS: clear to auscultation and  No wheeze or  crackles HEART/CVS: regular rate & rhythm and no murmurs; No lower extremity edema ABDOMEN: abdomen soft, non-tender and normal bowel sounds Musculoskeletal:no cyanosis of digits and no clubbing  PSYCH: alert & oriented x 3 with fluent speech NEURO: no focal motor/sensory deficits SKIN:  no rashes or significant lesions  LABORATORY DATA:  I have reviewed the data as listed Lab Results  Component Value Date   WBC 4.0 11/27/2016   HGB 12.5 (L) 11/27/2016   HCT 37.3 (L) 11/27/2016   MCV 82.6 11/27/2016   PLT 306 11/27/2016    Recent Labs  08/19/16 1132  09/18/16 1615 10/28/16 1445 11/27/16 1348  NA 134*  < > 135 135 133*  K 4.2  < > 4.0 4.5 4.9  CL 102  < > 106 108 105  CO2 23  < > 21* 21* 23  GLUCOSE 236*  < > 254* 127* 276*  BUN 19  < > 18 20 21*  CREATININE 1.58*  < > 1.36* 1.57* 1.69*  CALCIUM 9.1  < > 9.2 8.9 9.1  GFRNONAA 45*  < > 54* 45* 41*  GFRAA 52*  < > >60 52* 48*  PROT 7.1  --   --  7.0 7.0  ALBUMIN 3.5  --   --  3.4* 3.4*  AST 30  --   --  31 32  ALT 57  --   --  56 55  ALKPHOS 86  --   --  97 107  BILITOT 0.4  --   --  0.5 0.5  < > = values in this interval not displayed.  RADIOGRAPHIC STUDIES: I have personally reviewed the radiological images as listed and agreed with the findings in the report. No results found.  ASSESSMENT & PLAN:   Primary cancer of left upper lobe of lung ([place]) # Adenocarcinoma of the lung metastatic/stage IV- bilateral supraclavicular adenopathy; ROS-1 POSITIVE. Currently on Crizotinib 250 mg BID [since Nov]; CT scan 09/21/2016 shows partial response- improvement of the neck adenopathy mediastinal adenopathy.  # Tolerating well except for mild fatigue. No concerns for clinical progression at this time. Continue Crizotinib for now. Will CT scan prior to next visit.   # MRI brain- multiple strokes; sec to malignancy.on xarelto. No new strokes.  #  Chronic kidney disease stage III- creatinine 1.69/slight worsening. S/p urology  evaluation; nephrology evaluation. [?sec to Crizotinib versus obstruction at the level of the prostate]; monitor for now. We will check ultrasound  # diabetes- continue metformin once a day; and Glucotrol.   # Bilateral PE left lower extremity DVT- on xarelto. Continue the same.  # fatigue question from low testosterone. Testosterone low- however would recommend holding off supplementation given his hypercoagulable state.   # Given the ongoing fatigue and intermittent dizziness- inability to work patient will be candidate for long-term disability.  # Follow up in 4 weeks/labs/  scans prior.      Cammie Sickle, MD 11/27/2016 6:00 PM

## 2016-11-27 NOTE — Progress Notes (Signed)
Patient here today for follow up.  Patient states no new concerns today  

## 2016-11-27 NOTE — Assessment & Plan Note (Addendum)
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 Currently on Crizotinib 250 mg BID [since Nov]; CT scan 09/21/2016 shows partial response- improvement of the neck adenopathy mediastinal adenopathy.  # Tolerating well except for mild fatigue. No concerns for clinical progression at this time. Continue Crizotinib for now. Will CT scan prior to next visit.   # MRI brain- multiple strokes; sec to malignancy.on xarelto. No new strokes.  #  Chronic kidney disease stage III- creatinine 1.69/slight worsening. S/p urology evaluation; nephrology evaluation. [?sec to Crizotinib versus obstruction at the level of the prostate]; monitor for now. We will check ultrasound  # diabetes- continue metformin once a day; and Glucotrol.   # Bilateral PE left lower extremity DVT- on xarelto. Continue the same.  # fatigue question from low testosterone. Testosterone low- however would recommend holding off supplementation given his hypercoagulable state.   # Given the ongoing fatigue and intermittent dizziness- inability to work patient will be candidate for long-term disability.  # Follow up in 4 weeks/labs/  scans prior.

## 2016-12-23 ENCOUNTER — Ambulatory Visit
Admission: RE | Admit: 2016-12-23 | Discharge: 2016-12-23 | Disposition: A | Discharge status: Home / Self Care | Payer: Managed Care, Other (non HMO) | Source: Ambulatory Visit | Attending: Internal Medicine | Admitting: Internal Medicine

## 2016-12-23 DIAGNOSIS — I700 Atherosclerosis of aorta: Secondary | ICD-10-CM | POA: Diagnosis not present

## 2016-12-23 DIAGNOSIS — C3412 Malignant neoplasm of upper lobe, left bronchus or lung: Secondary | ICD-10-CM

## 2016-12-23 DIAGNOSIS — N183 Chronic kidney disease, stage 3 unspecified: Secondary | ICD-10-CM

## 2016-12-23 DIAGNOSIS — I2510 Atherosclerotic heart disease of native coronary artery without angina pectoris: Secondary | ICD-10-CM | POA: Insufficient documentation

## 2016-12-23 DIAGNOSIS — N1330 Unspecified hydronephrosis: Secondary | ICD-10-CM | POA: Diagnosis present

## 2016-12-23 DIAGNOSIS — K7689 Other specified diseases of liver: Secondary | ICD-10-CM | POA: Insufficient documentation

## 2016-12-25 ENCOUNTER — Inpatient Hospital Stay: Payer: Managed Care, Other (non HMO) | Attending: Internal Medicine

## 2016-12-25 ENCOUNTER — Inpatient Hospital Stay (HOSPITAL_BASED_OUTPATIENT_CLINIC_OR_DEPARTMENT_OTHER): Payer: Managed Care, Other (non HMO) | Admitting: Internal Medicine

## 2016-12-25 VITALS — BP 143/77 | HR 59 | Temp 97.0°F | Resp 20 | Ht 64.0 in | Wt 169.0 lb

## 2016-12-25 DIAGNOSIS — Z7901 Long term (current) use of anticoagulants: Secondary | ICD-10-CM

## 2016-12-25 DIAGNOSIS — Z7984 Long term (current) use of oral hypoglycemic drugs: Secondary | ICD-10-CM | POA: Diagnosis not present

## 2016-12-25 DIAGNOSIS — Z8719 Personal history of other diseases of the digestive system: Secondary | ICD-10-CM

## 2016-12-25 DIAGNOSIS — E785 Hyperlipidemia, unspecified: Secondary | ICD-10-CM | POA: Insufficient documentation

## 2016-12-25 DIAGNOSIS — K7689 Other specified diseases of liver: Secondary | ICD-10-CM

## 2016-12-25 DIAGNOSIS — C779 Secondary and unspecified malignant neoplasm of lymph node, unspecified: Secondary | ICD-10-CM | POA: Diagnosis not present

## 2016-12-25 DIAGNOSIS — C3412 Malignant neoplasm of upper lobe, left bronchus or lung: Secondary | ICD-10-CM | POA: Diagnosis not present

## 2016-12-25 DIAGNOSIS — R972 Elevated prostate specific antigen [PSA]: Secondary | ICD-10-CM | POA: Diagnosis not present

## 2016-12-25 DIAGNOSIS — I129 Hypertensive chronic kidney disease with stage 1 through stage 4 chronic kidney disease, or unspecified chronic kidney disease: Secondary | ICD-10-CM | POA: Insufficient documentation

## 2016-12-25 DIAGNOSIS — E7800 Pure hypercholesterolemia, unspecified: Secondary | ICD-10-CM | POA: Diagnosis not present

## 2016-12-25 DIAGNOSIS — Z8673 Personal history of transient ischemic attack (TIA), and cerebral infarction without residual deficits: Secondary | ICD-10-CM | POA: Insufficient documentation

## 2016-12-25 DIAGNOSIS — N183 Chronic kidney disease, stage 3 (moderate): Secondary | ICD-10-CM

## 2016-12-25 DIAGNOSIS — Z79899 Other long term (current) drug therapy: Secondary | ICD-10-CM | POA: Diagnosis not present

## 2016-12-25 DIAGNOSIS — E119 Type 2 diabetes mellitus without complications: Secondary | ICD-10-CM

## 2016-12-25 DIAGNOSIS — Z86718 Personal history of other venous thrombosis and embolism: Secondary | ICD-10-CM | POA: Insufficient documentation

## 2016-12-25 DIAGNOSIS — Z86711 Personal history of pulmonary embolism: Secondary | ICD-10-CM

## 2016-12-25 DIAGNOSIS — I2510 Atherosclerotic heart disease of native coronary artery without angina pectoris: Secondary | ICD-10-CM | POA: Diagnosis not present

## 2016-12-25 DIAGNOSIS — I700 Atherosclerosis of aorta: Secondary | ICD-10-CM | POA: Insufficient documentation

## 2016-12-25 LAB — COMPREHENSIVE METABOLIC PANEL
ALBUMIN: 3.6 g/dL (ref 3.5–5.0)
ALT: 68 U/L — AB (ref 17–63)
AST: 34 U/L (ref 15–41)
Alkaline Phosphatase: 113 U/L (ref 38–126)
Anion gap: 5 (ref 5–15)
BUN: 17 mg/dL (ref 6–20)
CO2: 19 mmol/L — AB (ref 22–32)
CREATININE: 1.57 mg/dL — AB (ref 0.61–1.24)
Calcium: 8.7 mg/dL — ABNORMAL LOW (ref 8.9–10.3)
Chloride: 109 mmol/L (ref 101–111)
GFR calc non Af Amer: 45 mL/min — ABNORMAL LOW (ref >60)
GFR, EST AFRICAN AMERICAN: 52 mL/min — AB (ref >60)
GLUCOSE: 174 mg/dL — AB (ref 65–99)
Potassium: 4.4 mmol/L (ref 3.5–5.1)
SODIUM: 133 mmol/L — AB (ref 135–145)
Total Bilirubin: 0.5 mg/dL (ref 0.3–1.2)
Total Protein: 6.7 g/dL (ref 6.5–8.1)

## 2016-12-25 LAB — CBC WITH DIFFERENTIAL/PLATELET
Basophils Absolute: 0 10*3/uL (ref 0–0.1)
Basophils Relative: 1 %
EOS ABS: 0.3 10*3/uL (ref 0–0.7)
Eosinophils Relative: 7 %
HEMATOCRIT: 37.3 % — AB (ref 40.0–52.0)
HEMOGLOBIN: 12.5 g/dL — AB (ref 13.0–18.0)
LYMPHS ABS: 1.6 10*3/uL (ref 1.0–3.6)
Lymphocytes Relative: 36 %
MCH: 27.2 pg (ref 26.0–34.0)
MCHC: 33.5 g/dL (ref 32.0–36.0)
MCV: 81.1 fL (ref 80.0–100.0)
MONOS PCT: 23 %
Monocytes Absolute: 1 10*3/uL (ref 0.2–1.0)
NEUTROS PCT: 33 %
Neutro Abs: 1.4 10*3/uL (ref 1.4–6.5)
Platelets: 309 10*3/uL (ref 150–440)
RBC: 4.6 MIL/uL (ref 4.40–5.90)
RDW: 16 % — ABNORMAL HIGH (ref 11.5–14.5)
WBC: 4.3 10*3/uL (ref 3.8–10.6)

## 2016-12-25 NOTE — Progress Notes (Signed)
[place] NOTE  Patient Care Team: Roselee Nova, MD as PCP - General (Family Medicine)  CHIEF COMPLAINTS/PURPOSE OF CONSULTATION:  Lung cancer  #  Oncology History   # OCT 2017- ADENO CA LUNG; STAGE IV [; LUL; bil supraclavicular LN; Left neck LN Bx]; ROS-1 MUTATED; s/p Carbo-alimta x1[oct 2017]  # NOV 1st 2017- XALKORI 250 mg BID; JAN 15th CT- PR  # LLE DVT/bil PE/Multiple strokes [? On xarelto]-Lovenox; Jan mid 2018- xarelto [lovenox-insurance issues]  # MRI brain- multiple infarcts [2d echo/bubble study-NEG's/p Neurology eval]  # # s/p TURP [Sep 2017; Dr.Cope] DEC 26th CT- distended bladder   # MOLECULAR TESTING- ROS-1 POSITIVE; ALK/EGFR-NEG; PDL-1 EXPRESSION- 90%** [HIGH]     Primary cancer of left upper lobe of lung ([place])   06/12/2016 Initial Diagnosis    Primary cancer of left upper lobe of lung ([place])       HISTORY OF PRESENTING ILLNESS:  Melvin Willis 66 y.o.  male with newly diagnosedMetastatic lung cancer ROS- positive; Multiple strokes secondary to his metastatic lung cancer- Crizotinib  And xarelto is here for a follow up.   Patient denies any shortness of breath or cough. Appetite is good. He is urinating well. He denies any wheezing.He states to be compliant with his oral chemotherapy medication and oral blood thinner.  Otherwise no vomiting no diarrhea no constipation.  Denies any new lumps or bumps. He complains of mild swelling in the legs.  ROS: A complete 10 point review of system is done which is negative except mentioned above in history of present illness  MEDICAL HISTORY:  Past Medical History:  Diagnosis Date  . Abnormal prostate specific antigen 08/08/2012  . Adiposity 04/16/2015  . Chronic kidney disease (CKD), stage III (moderate) 11/27/2016  . CVA (cerebral vascular accident) ([place]) 06/17/2016  . Diabetes mellitus without complication ([place])   . Diverticulosis of sigmoid colon 04/16/2015  . Dyslipidemia 03/18/2015  .  Hemorrhoids, internal 04/16/2015  . Hypercholesteremia 04/16/2015  . Hyperlipidemia   . Hypertension   . Primary cancer of left upper lobe of lung ([place])   . Pulmonary embolism ([place])   . Wears dentures    partial upper    SURGICAL HISTORY: Past Surgical History:  Procedure Laterality Date  . COLONOSCOPY    . COLONOSCOPY WITH PROPOFOL N/A 05/06/2015   Procedure: COLONOSCOPY WITH PROPOFOL;  Surgeon: Lucilla Lame, MD;  Location: [place];  Service: Endoscopy;  Laterality: N/A;  ASCENDING COLON POLYPS X 2 TERMINAL ILEUM BIOPSY RANDOM COLON BX. TRANSVERSE COLON POLYP SIGMOID COLON POLYP  . ESOPHAGOGASTRODUODENOSCOPY (EGD) WITH PROPOFOL N/A 05/06/2015   Procedure: ESOPHAGOGASTRODUODENOSCOPY (EGD) WITH PROPOFOL;  Surgeon: Lucilla Lame, MD;  Location: [facility];  Service: Endoscopy;  Laterality: N/A;  GASTRIC BIOPSY X1    SOCIAL HISTORY: Social History   Social History  . Marital status: Married    Spouse name: N/A  . Number of children: N/A  . Years of education: N/A   Occupational History  . Not on file.   Social History Main Topics  . Smoking status: Never Smoker  . Smokeless tobacco: Never Used  . Alcohol use No  . Drug use: No  . Sexual activity: Yes    Partners: Female   Other Topics Concern  . Not on file   Social History Narrative  . No narrative on file    FAMILY HISTORY: Family History  Problem Relation Age of Onset  . Diabetes Mother   . Diabetes  Father   . CAD Father   . Dementia Father   . Diabetes Sister   . Cancer Maternal Uncle     Prostate  . Cancer Cousin     prostate    ALLERGIES:  has No Known Allergies.  MEDICATIONS:  Current Outpatient Prescriptions  Medication Sig Dispense Refill  . atorvastatin (LIPITOR) 40 MG tablet Take 1 tablet (40 mg total) by mouth daily at 6 PM. 90 tablet 0  . crizotinib (XALKORI) 250 MG capsule Take 1 capsule (250 mg total) by mouth 2 (two) times daily. 60 capsule 4  . finasteride (PROSCAR) 5  MG tablet Take 1 tablet (5 mg total) by mouth at bedtime. 14 tablet 0  . GLIPIZIDE PO Take 5 mg by mouth 2 (two) times daily.     . metFORMIN (GLUCOPHAGE) 1000 MG tablet Take 0.5 tablets (500 mg total) by mouth 2 (two) times daily. (Patient taking differently: Take 500 mg by mouth daily with breakfast. ) 90 tablet 0  . rivaroxaban (XARELTO) 20 MG TABS tablet Take 1 tablet (20 mg total) by mouth daily with supper. 30 tablet 5  . tamsulosin (FLOMAX) 0.4 MG CAPS capsule Take 0.4 mg by mouth every morning.     . folic acid (FOLVITE) 1 MG tablet Take 1 tablet (1 mg total) by mouth daily. (Patient not taking: Reported on 11/27/2016) 30 tablet 3  . ondansetron (ZOFRAN) 8 MG tablet Take 1 tablet (8 mg total) by mouth every 8 (eight) hours as needed for nausea or vomiting (start 3 days; after chemo). (Patient not taking: Reported on 12/25/2016) 40 tablet 1  . prochlorperazine (COMPAZINE) 10 MG tablet Take 1 tablet (10 mg total) by mouth every 6 (six) hours as needed for nausea or vomiting. (Patient not taking: Reported on 12/25/2016) 40 tablet 1   No current facility-administered medications for this visit.       Marland Kitchen  PHYSICAL EXAMINATION: ECOG PERFORMANCE STATUS: 2 - Symptomatic, <50% confined to bed  Vitals:   12/25/16 1514  BP: (!) 143/77  Pulse: (!) 59  Resp: 20  Temp: 97 F (36.1 C)   Filed Weights   12/25/16 1514  Weight: 169 lb (76.7 kg)    GENERAL: Well-nourished well-developed; Alert, no distress and comfortable.   With his wife.  EYES: no pallor or icterus OROPHARYNX: no thrush or ulceration; good dentition  NECK: supple, no masses felt LYMPH:  no palpable lymphadenopathy in the cervical, axillary or inguinal regions LUNGS: clear to auscultation and  No wheeze or crackles HEART/CVS: regular rate & rhythm and no murmurs; No lower extremity edema ABDOMEN: abdomen soft, non-tender and normal bowel sounds Musculoskeletal:no cyanosis of digits and no clubbing  PSYCH: alert & oriented x  3 with fluent speech NEURO: no focal motor/sensory deficits SKIN:  no rashes or significant lesions  LABORATORY DATA:  I have reviewed the data as listed Lab Results  Component Value Date   WBC 4.3 12/25/2016   HGB 12.5 (L) 12/25/2016   HCT 37.3 (L) 12/25/2016   MCV 81.1 12/25/2016   PLT 309 12/25/2016    Recent Labs  10/28/16 1445 11/27/16 1348 12/25/16 1450  NA 135 133* 133*  K 4.5 4.9 4.4  CL 108 105 109  CO2 21* 23 19*  GLUCOSE 127* 276* 174*  BUN 20 21* 17  CREATININE 1.57* 1.69* 1.57*  CALCIUM 8.9 9.1 8.7*  GFRNONAA 45* 41* 45*  GFRAA 52* 48* 52*  PROT 7.0 7.0 6.7  ALBUMIN 3.4* 3.4* 3.6  AST 31 32 34  ALT 56 55 68*  ALKPHOS 97 107 113  BILITOT 0.5 0.5 0.5    RADIOGRAPHIC STUDIES: I have personally reviewed the radiological images as listed and agreed with the findings in the report. Ct Chest Wo Contrast  Result Date: 12/23/2016 CLINICAL DATA:  Followup lung cancer. EXAM: CT CHEST WITHOUT CONTRAST TECHNIQUE: Multidetector CT imaging of the chest was performed following the standard protocol without IV contrast. COMPARISON:  09/21/2016 FINDINGS: Cardiovascular: Aortic atherosclerosis. Calcification in the LAD and RCA coronary artery noted. Normal heart size. There is no pericardial effusion identified. Mediastinum/Nodes: The trachea appears patent and is midline. Normal appearance of the esophagus. Index left pre-vascular node measures 1 cm, image 39 of series 2. Unchanged from previous exam. Ive adjacent index pre-vascular node measures 1.4 cm, image 42 of series 2. Previous 11 mm. No right paratracheal or sub- carinal adenopathy. No hilar adenopathy. Lungs/Pleura: No pleural effusion. Anteromedial left upper lobe mass/ postobstructive pneumonitis measures 4.3 x 2.8 cm, image 39 of series 3. Previously 5.1 x 3.1 cm. Stable subpleural right middle lobe lung nodule measuring 5 mm, image 95 of series 3. Upper Abdomen: Liver cyst.  No acute abnormality. Musculoskeletal: No  aggressive lytic or sclerotic bone lesions. IMPRESSION: 1. Mixed interval response to therapy. Anteromedial left upper lung mass is decreased in size from 09/21/2016. The pre-vascular lymph nodes are stable to mildly increased in the interval. 2.  Aortic Atherosclerosis (ICD10-I70.0). 3. Coronary artery calcifications. Electronically Signed   By: Kerby Moors M.D.   On: 12/23/2016 11:22   US Abdomen Complete  Result Date: 12/23/2016 CLINICAL DATA:  Hydronephrosis follow-up.  History of lung cancer. EXAM: ABDOMEN ULTRASOUND COMPLETE COMPARISON:  CT 09/01/2016. FINDINGS: Gallbladder: Limited visualization of the gallbladder. The gallbladder is obscured by overlying bowel gas. No definite gallstones. Gallbladder wall thickness appears normal. Negative Murphy sign. Common bile duct: Diameter: 6 mm Liver: 1.4 x 1.2 x 1.3 cm cyst right lobe of the liver. 2.5 x 3.4 x 3.8 cm thinly septated cyst left lobe of the liver. These findings are stable from prior exam. These are most likely benign hepatic cyst. IVC: No abnormality visualized. Pancreas: Visualized portion unremarkable. Spleen: Size and appearance within normal limits. Right Kidney: Length: 10.8 cm. Echogenicity within normal limits. No mass or hydronephrosis visualized. Left Kidney: Length: 10.5 cm. Echogenicity within normal limits. No mass or hydronephrosis visualized. Abdominal aorta: No aneurysm visualized. Other findings: None. IMPRESSION: 1. No hydronephrosis noted on today's exam. 2. Stable hepatic cysts as above. These are most likely benign. Increased echogenicity of the liver consistent with fatty infiltration. 3. Limited visualization of the gallbladder. No definite gallstones or evidence of cholecystitis noted . Electronically Signed   By: Marcello Moores  Register   On: 12/23/2016 09:29    ASSESSMENT & PLAN:   Primary cancer of left upper lobe of lung ([place]) # Adenocarcinoma of the lung metastatic/stage IV- bilateral supraclavicular adenopathy; ROS-1  POSITIVE. Currently on Crizotinib 250 mg BID [since Nov]; CT scan 12/21/2016 shows mixed  response- improvement of the LUL mass [~4-5cm]; few mm increase in size of pre-tracheal LN.   # Tolerating well except for mild fatigue. Continue Crizotinib for now.   # MRI brain- multiple strokes; sec to malignancy.on xarelto. No new strokes.  #  Chronic kidney disease stage III- creatinine 1.5/stable. S/p urology evaluation; nephrology evaluation. [?sec to Crizotinib versus obstruction at the level of the prostate]; US- kidney- no hydronephrosis.  # Bil LE swelling CKD/ Crizotinib- continue stockings for  now.   # Bilateral PE left lower extremity DVT- on xarelto. Continue the same.  # Follow up in 4 weeks/labs.   # I reviewed the blood work- with the patient in detail; also reviewed the imaging independently [as summarized above]; and with the patient in detail.       Cammie Sickle, MD 12/27/2016 7:21 PM

## 2016-12-25 NOTE — Progress Notes (Signed)
Pt has not missed any dosing of Xalkori. Does not report any drug adverse events or side effects. Pt tolerating well.

## 2016-12-25 NOTE — Assessment & Plan Note (Addendum)
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 Currently on Crizotinib 250 mg BID [since Nov]; CT scan 12/21/2016 shows mixed  response- improvement of the LUL mass [~4-5cm]; few mm increase in size of pre-tracheal LN.   # Tolerating well except for mild fatigue. Continue Crizotinib for now.   # MRI brain- multiple strokes; sec to malignancy.on xarelto. No new strokes.  #  Chronic kidney disease stage III- creatinine 1.5/stable. S/p urology evaluation; nephrology evaluation. [?sec to Crizotinib versus obstruction at the level of the prostate]; US- kidney- no hydronephrosis.  # Bil LE swelling CKD/ Crizotinib- continue stockings for now.   # Bilateral PE left lower extremity DVT- on xarelto. Continue the same.  # Follow up in 4 weeks/labs.   # I reviewed the blood work- with the patient in detail; also reviewed the imaging independently [as summarized above]; and with the patient in detail.

## 2017-01-04 ENCOUNTER — Encounter: Payer: Self-pay | Admitting: Family Medicine

## 2017-01-04 ENCOUNTER — Ambulatory Visit (INDEPENDENT_AMBULATORY_CARE_PROVIDER_SITE_OTHER): Payer: Managed Care, Other (non HMO) | Admitting: Family Medicine

## 2017-01-04 VITALS — BP 128/76 | HR 64 | Temp 97.6°F | Resp 16 | Ht 64.0 in | Wt 171.0 lb

## 2017-01-04 DIAGNOSIS — E119 Type 2 diabetes mellitus without complications: Secondary | ICD-10-CM

## 2017-01-04 DIAGNOSIS — R222 Localized swelling, mass and lump, trunk: Secondary | ICD-10-CM | POA: Diagnosis not present

## 2017-01-04 DIAGNOSIS — E7800 Pure hypercholesterolemia, unspecified: Secondary | ICD-10-CM

## 2017-01-04 LAB — GLUCOSE, POCT (MANUAL RESULT ENTRY): POC Glucose: 134 mg/dl — AB (ref 70–99)

## 2017-01-04 LAB — POCT GLYCOSYLATED HEMOGLOBIN (HGB A1C): HEMOGLOBIN A1C: 7.4

## 2017-01-04 MED ORDER — ATORVASTATIN CALCIUM 40 MG PO TABS: 40.0000 mg | ORAL_TABLET | Freq: Every day | ORAL | 0 refills | Status: DC

## 2017-01-04 MED ORDER — SITAGLIPTIN PHOSPHATE 50 MG PO TABS: 50.0000 mg | ORAL_TABLET | Freq: Every day | ORAL | 0 refills | Status: DC

## 2017-01-04 NOTE — Progress Notes (Signed)
Name: Melvin Willis   MRN: [id number]    DOB: [birth year]   Date:01/04/2017       Progress Note  Subjective  Chief Complaint  Chief Complaint  Patient presents with  . Diabetes  . Hypertension    Diabetes  He presents for his follow-up diabetic visit. He has type 2 diabetes mellitus. His disease course has been worsening. Hypoglycemia symptoms include dizziness, headaches and sweats. Pertinent negatives for diabetes include no fatigue, no polydipsia and no polyuria. Pertinent negatives for diabetic complications include no heart disease or peripheral neuropathy. Risk factors for coronary artery disease include diabetes mellitus. Current diabetic treatment includes oral agent (dual therapy). He is following a generally healthy diet. He rarely participates in exercise. His breakfast blood glucose range is generally 110-130 mg/dl. An ACE inhibitor/angiotensin II receptor blocker is not being taken.  Hyperlipidemia  This is a chronic problem. The problem is controlled. Recent lipid tests were reviewed and are normal. Pertinent negatives include no leg pain or myalgias. Current antihyperlipidemic treatment includes statins. Risk factors for coronary artery disease include diabetes mellitus, dyslipidemia and male sex.   Patient has a mass on his right upper chest over the right collar bone, appeared spontaneously last week, no change in size since then, no pain, no drainage or redness. He has had a CT scan of chest 2 weeks ago to determine response to ChemoRx, showed no problems in the right lung at that time.   Past Medical History:  Diagnosis Date  . Abnormal prostate specific antigen 08/08/2012  . Adiposity 04/16/2015  . Chronic kidney disease (CKD), stage III (moderate) 11/27/2016  . CVA (cerebral vascular accident) ([place]) 06/17/2016  . Diabetes mellitus without complication ([place])   . Diverticulosis of sigmoid colon 04/16/2015  . Dyslipidemia 03/18/2015  . Hemorrhoids, internal 04/16/2015  .  Hypercholesteremia 04/16/2015  . Hyperlipidemia   . Hypertension   . Primary cancer of left upper lobe of lung ([place])   . Pulmonary embolism ([place])   . Wears dentures    partial upper    Past Surgical History:  Procedure Laterality Date  . COLONOSCOPY    . COLONOSCOPY WITH PROPOFOL N/A 05/06/2015   Procedure: COLONOSCOPY WITH PROPOFOL;  Surgeon: Lucilla Lame, MD;  Location: [facility];  Service: Endoscopy;  Laterality: N/A;  ASCENDING COLON POLYPS X 2 TERMINAL ILEUM BIOPSY RANDOM COLON BX. TRANSVERSE COLON POLYP SIGMOID COLON POLYP  . ESOPHAGOGASTRODUODENOSCOPY (EGD) WITH PROPOFOL N/A 05/06/2015   Procedure: ESOPHAGOGASTRODUODENOSCOPY (EGD) WITH PROPOFOL;  Surgeon: Lucilla Lame, MD;  Location: [facility];  Service: Endoscopy;  Laterality: N/A;  GASTRIC BIOPSY X1    Family History  Problem Relation Age of Onset  . Diabetes Mother   . Diabetes Father   . CAD Father   . Dementia Father   . Diabetes Sister   . Cancer Maternal Uncle     Prostate  . Cancer Cousin     prostate    Social History   Social History  . Marital status: Married    Spouse name: N/A  . Number of children: N/A  . Years of education: N/A   Occupational History  . Not on file.   Social History Main Topics  . Smoking status: Never Smoker  . Smokeless tobacco: Never Used  . Alcohol use No  . Drug use: No  . Sexual activity: Yes    Partners: Female   Other Topics Concern  . Not on file   Social History Narrative  . No narrative  on file     Current Outpatient Prescriptions:  .  atorvastatin (LIPITOR) 40 MG tablet, Take 1 tablet (40 mg total) by mouth daily at 6 PM., Disp: 90 tablet, Rfl: 0 .  crizotinib (XALKORI) 250 MG capsule, Take 1 capsule (250 mg total) by mouth 2 (two) times daily., Disp: 60 capsule, Rfl: 4 .  finasteride (PROSCAR) 5 MG tablet, Take 1 tablet (5 mg total) by mouth at bedtime., Disp: 14 tablet, Rfl: 0 .  folic acid (FOLVITE) 1 MG tablet, Take 1 tablet (1 mg  total) by mouth daily., Disp: 30 tablet, Rfl: 3 .  GLIPIZIDE PO, Take 5 mg by mouth 2 (two) times daily. , Disp: , Rfl:  .  metFORMIN (GLUCOPHAGE) 1000 MG tablet, Take 0.5 tablets (500 mg total) by mouth 2 (two) times daily. (Patient taking differently: Take 500 mg by mouth daily with breakfast. ), Disp: 90 tablet, Rfl: 0 .  ondansetron (ZOFRAN) 8 MG tablet, Take 1 tablet (8 mg total) by mouth every 8 (eight) hours as needed for nausea or vomiting (start 3 days; after chemo)., Disp: 40 tablet, Rfl: 1 .  prochlorperazine (COMPAZINE) 10 MG tablet, Take 1 tablet (10 mg total) by mouth every 6 (six) hours as needed for nausea or vomiting., Disp: 40 tablet, Rfl: 1 .  rivaroxaban (XARELTO) 20 MG TABS tablet, Take 1 tablet (20 mg total) by mouth daily with supper., Disp: 30 tablet, Rfl: 5 .  tamsulosin (FLOMAX) 0.4 MG CAPS capsule, Take 0.4 mg by mouth every morning. , Disp: , Rfl:   No Known Allergies   Review of Systems  Constitutional: Negative for fatigue.  Musculoskeletal: Negative for myalgias.  Neurological: Positive for dizziness and headaches.  Endo/Heme/Allergies: Negative for polydipsia.    Objective  Vitals:   01/04/17 0843  BP: 128/76  Pulse: 64  Resp: 16  Temp: 97.6 F (36.4 C)  TempSrc: Oral  SpO2: 97%  Weight: 171 lb (77.6 kg)  Height: '5\' 4"'$  (1.626 m)    Physical Exam  Constitutional: He is oriented to person, place, and time and well-developed, well-nourished, and in no distress.  HENT:  Head: Normocephalic and atraumatic.  Cardiovascular: Normal rate, regular rhythm and normal heart sounds.   No murmur heard. Pulmonary/Chest: Effort normal and breath sounds normal. He has no wheezes.    Oval shaped soft non tender mobil mass over the right clavicular bone, by palpation, it has the appearance of an enlarged supraclavicular LN  Abdominal: Soft. Bowel sounds are normal. There is no tenderness.  Neurological: He is alert and oriented to person, place, and time.    Psychiatric: Mood, memory, affect and judgment normal.  Nursing note and vitals reviewed.     Assessment & Plan  1. Diabetes mellitus without complication (HCC) Worse, A1c is 7.4%, with hypoglycemic episodes. Replace glipizide and Januvia 50 mg daily, no change in metformin. Recheck in 3 months  - POCT HgB A1C - POCT Glucose (CBG) - Urine Microalbumin w/creat. ratio - sitaGLIPtin (JANUVIA) 50 MG tablet; Take 1 tablet (50 mg total) by mouth daily.  Dispense: 90 tablet; Refill: 0  2. Mass of right chest wall Appears to be an enlarged lymph node, obtain ultrasound, patient to follow-up with oncology - US Soft Tissue Head/Neck; Future  3. Hypercholesteremia On statin, recheck FLP and liver enzymes - Lipid panel - COMPLETE METABOLIC PANEL WITH GFR - atorvastatin (LIPITOR) 40 MG tablet; Take 1 tablet (40 mg total) by mouth daily at 6 PM.  Dispense: 90 tablet; Refill:  0   Muskaan Smet [facility] 01/04/2017 9:00 AM

## 2017-01-05 LAB — COMPREHENSIVE METABOLIC PANEL
AG RATIO: 1.2 ratio (ref 1.0–2.5)
ALK PHOS: 110 U/L (ref 40–115)
ALT: 67 U/L — ABNORMAL HIGH (ref 9–46)
AST: 29 U/L (ref 10–35)
Albumin: 3.6 g/dL (ref 3.6–5.1)
BILIRUBIN TOTAL: 0.4 mg/dL (ref 0.2–1.2)
BUN / CREAT RATIO: 11.5 ratio (ref 6–22)
BUN: 18 mg/dL (ref 7–25)
CALCIUM: 8.9 mg/dL (ref 8.6–10.3)
CHLORIDE: 105 mmol/L (ref 98–110)
CO2: 21 mmol/L (ref 20–31)
Creat: 1.57 mg/dL — ABNORMAL HIGH (ref 0.70–1.25)
GFR, EST AFRICAN AMERICAN: 53 mL/min — AB (ref >=60)
GFR, Est Non African American: 46 mL/min — ABNORMAL LOW (ref >=60)
Globulin: 2.9 g/dL (ref 1.9–3.7)
Glucose, Bld: 154 mg/dL — ABNORMAL HIGH (ref 65–99)
Potassium: 5.1 mmol/L (ref 3.5–5.3)
Sodium: 134 mmol/L — ABNORMAL LOW (ref 135–146)
Total Protein: 6.5 g/dL (ref 6.1–8.1)

## 2017-01-05 LAB — LIPID PANEL
CHOL/HDL RATIO: 2.6 ratio (ref <5.0)
Cholesterol: 129 mg/dL (ref <200)
HDL: 49 mg/dL (ref >40)
LDL Cholesterol: 64 mg/dL (ref <100)
Triglycerides: 81 mg/dL (ref <150)
VLDL: 16 mg/dL (ref <30)

## 2017-01-05 LAB — MICROALBUMIN / CREATININE URINE RATIO
CREATININE, URINE: 85 mg/dL (ref 20–370)
MICROALB UR: 0.5 mg/dL
Microalb Creat Ratio: 6 mcg/mg creat (ref <30)

## 2017-01-06 ENCOUNTER — Telehealth: Payer: Self-pay | Admitting: *Deleted

## 2017-01-06 ENCOUNTER — Telehealth: Payer: Self-pay | Admitting: Internal Medicine

## 2017-01-06 NOTE — Telephone Encounter (Signed)
Went to see PCP, he has a lump that appeared on his collar bone and has been there for several weeks now. He has ordered and [place] to be done this Friday. Patietn wanted Dr B to know  Pulmonary/Chest: Effort normal and breath sounds normal. He has no wheezes.    Oval shaped soft non tender mobil mass over the right clavicular bone, by palpation, it has the appearance of an enlarged supraclavicular LN    Mass of right chest wall Appears to be an enlarged lymph node, obtain ultrasound, patient to follow-up with oncology - US Soft Tissue Head/Neck; Future

## 2017-01-06 NOTE — Telephone Encounter (Signed)
Per [name], ok to proceed with [place] and keep appt 5/16 as scheduled. Patient informed and stated "OK"

## 2017-01-06 NOTE — Telephone Encounter (Signed)
Got message that pt is getting US neck as per PCP; okay to have it done. Will await for results [most likely from his lung cancer].   Dr.Shah- please forward the [place] results to me; if any questions- reach me at [phone].Thanks.

## 2017-01-08 ENCOUNTER — Ambulatory Visit
Admission: RE | Admit: 2017-01-08 | Discharge: 2017-01-08 | Disposition: A | Discharge status: Home / Self Care | Payer: Managed Care, Other (non HMO) | Source: Ambulatory Visit | Attending: Family Medicine | Admitting: Family Medicine

## 2017-01-08 DIAGNOSIS — R222 Localized swelling, mass and lump, trunk: Secondary | ICD-10-CM | POA: Diagnosis not present

## 2017-01-13 ENCOUNTER — Telehealth: Payer: Self-pay | Admitting: *Deleted

## 2017-01-13 DIAGNOSIS — Z599 Problem related to housing and economic circumstances, unspecified: Secondary | ICD-10-CM

## 2017-01-13 DIAGNOSIS — Z598 Other problems related to housing and economic circumstances: Secondary | ICD-10-CM

## 2017-01-13 DIAGNOSIS — Z5989 Other problems related to housing and economic circumstances: Secondary | ICD-10-CM

## 2017-01-13 NOTE — Telephone Encounter (Signed)
Patient contacted rn back. Will not be able to be there to complete form until 330 tommorrow due to a funeral. In the meantime, he will get his financial papers together in advance so this is ready to go before tomorrow.

## 2017-01-13 NOTE — Telephone Encounter (Signed)
He lost his insurance and needs patient assistance for his med, States he used to get it for free before he had [employer]. Please look into this for him and let him know.

## 2017-01-13 NOTE — Telephone Encounter (Signed)
Returned phone call to patient. Discussed his financial barriers to his care. Per pt, he knew his insurance was going to be "eliminated, but didn't know when that date was going to occur." states that he received the letter yesterday from his insurance that the end date of his insurance was on May 4'th.  The letter stated, that the insurance team attempted to reach the patient on multiple occassions but was not successful." pt does not recall any msgs left from insurance team.  Patient's main concern is not being able to get his oral chemo - Xalkori. He has 1 week's supply left. He does have enough xarelto at this time - approx. 3 weeks supply left. He states that at this time, he does not need any financial assistance for his routine medications as these were recently filled. I discussed a referral to the medication management team to help him with his routine medications. He would also like to speak to [facility] regarding his financial barriers.  Patient agreed to come tomorrow to complete [facility] patient assistance program application for [employer]. Patient agreed to come tomorrow at 130 pm to meet with the nursing team to complete application.  Pt instructed to bring proof of income/tax returns. Pt has not worked at all since his cancer dx. He feels that he may meet financial criteria for medication assistance.

## 2017-01-14 NOTE — Telephone Encounter (Signed)
Pt came to clinic today at 1530 to complete pfizer application. Form completed and faxed to [employer].

## 2017-01-20 ENCOUNTER — Telehealth: Payer: Self-pay | Admitting: *Deleted

## 2017-01-20 ENCOUNTER — Inpatient Hospital Stay: Payer: Self-pay | Attending: Internal Medicine

## 2017-01-20 ENCOUNTER — Inpatient Hospital Stay (HOSPITAL_BASED_OUTPATIENT_CLINIC_OR_DEPARTMENT_OTHER): Payer: Self-pay | Admitting: Internal Medicine

## 2017-01-20 VITALS — BP 142/80 | HR 59 | Temp 97.8°F | Resp 18 | Ht 64.0 in | Wt 174.5 lb

## 2017-01-20 DIAGNOSIS — Z8042 Family history of malignant neoplasm of prostate: Secondary | ICD-10-CM

## 2017-01-20 DIAGNOSIS — E119 Type 2 diabetes mellitus without complications: Secondary | ICD-10-CM

## 2017-01-20 DIAGNOSIS — E7800 Pure hypercholesterolemia, unspecified: Secondary | ICD-10-CM

## 2017-01-20 DIAGNOSIS — R5383 Other fatigue: Secondary | ICD-10-CM | POA: Insufficient documentation

## 2017-01-20 DIAGNOSIS — R591 Generalized enlarged lymph nodes: Secondary | ICD-10-CM | POA: Insufficient documentation

## 2017-01-20 DIAGNOSIS — Z86711 Personal history of pulmonary embolism: Secondary | ICD-10-CM | POA: Insufficient documentation

## 2017-01-20 DIAGNOSIS — I129 Hypertensive chronic kidney disease with stage 1 through stage 4 chronic kidney disease, or unspecified chronic kidney disease: Secondary | ICD-10-CM | POA: Insufficient documentation

## 2017-01-20 DIAGNOSIS — C3412 Malignant neoplasm of upper lobe, left bronchus or lung: Secondary | ICD-10-CM

## 2017-01-20 DIAGNOSIS — R972 Elevated prostate specific antigen [PSA]: Secondary | ICD-10-CM | POA: Insufficient documentation

## 2017-01-20 DIAGNOSIS — C770 Secondary and unspecified malignant neoplasm of lymph nodes of head, face and neck: Secondary | ICD-10-CM | POA: Insufficient documentation

## 2017-01-20 DIAGNOSIS — Z8719 Personal history of other diseases of the digestive system: Secondary | ICD-10-CM | POA: Insufficient documentation

## 2017-01-20 DIAGNOSIS — Z8673 Personal history of transient ischemic attack (TIA), and cerebral infarction without residual deficits: Secondary | ICD-10-CM

## 2017-01-20 DIAGNOSIS — M7989 Other specified soft tissue disorders: Secondary | ICD-10-CM | POA: Insufficient documentation

## 2017-01-20 DIAGNOSIS — Z86718 Personal history of other venous thrombosis and embolism: Secondary | ICD-10-CM | POA: Insufficient documentation

## 2017-01-20 DIAGNOSIS — Z7901 Long term (current) use of anticoagulants: Secondary | ICD-10-CM | POA: Insufficient documentation

## 2017-01-20 DIAGNOSIS — Z79899 Other long term (current) drug therapy: Secondary | ICD-10-CM | POA: Insufficient documentation

## 2017-01-20 DIAGNOSIS — N183 Chronic kidney disease, stage 3 (moderate): Secondary | ICD-10-CM

## 2017-01-20 DIAGNOSIS — Z7984 Long term (current) use of oral hypoglycemic drugs: Secondary | ICD-10-CM | POA: Insufficient documentation

## 2017-01-20 LAB — COMPREHENSIVE METABOLIC PANEL
ALK PHOS: 110 U/L (ref 38–126)
ALT: 65 U/L — ABNORMAL HIGH (ref 17–63)
AST: 35 U/L (ref 15–41)
Albumin: 3.6 g/dL (ref 3.5–5.0)
Anion gap: 7 (ref 5–15)
BUN: 19 mg/dL (ref 6–20)
CHLORIDE: 108 mmol/L (ref 101–111)
CO2: 20 mmol/L — AB (ref 22–32)
CREATININE: 1.64 mg/dL — AB (ref 0.61–1.24)
Calcium: 9 mg/dL (ref 8.9–10.3)
GFR calc non Af Amer: 43 mL/min — ABNORMAL LOW (ref >60)
GFR, EST AFRICAN AMERICAN: 50 mL/min — AB (ref >60)
Glucose, Bld: 162 mg/dL — ABNORMAL HIGH (ref 65–99)
Potassium: 4.6 mmol/L (ref 3.5–5.1)
SODIUM: 135 mmol/L (ref 135–145)
Total Bilirubin: 0.6 mg/dL (ref 0.3–1.2)
Total Protein: 7 g/dL (ref 6.5–8.1)

## 2017-01-20 LAB — CBC WITH DIFFERENTIAL/PLATELET
BASOS PCT: 0 %
Basophils Absolute: 0 10*3/uL (ref 0–0.1)
EOS ABS: 0.2 10*3/uL (ref 0–0.7)
EOS PCT: 5 %
HCT: 40 % (ref 40.0–52.0)
HEMOGLOBIN: 13.5 g/dL (ref 13.0–18.0)
LYMPHS ABS: 1.6 10*3/uL (ref 1.0–3.6)
Lymphocytes Relative: 37 %
MCH: 27.6 pg (ref 26.0–34.0)
MCHC: 33.8 g/dL (ref 32.0–36.0)
MCV: 81.6 fL (ref 80.0–100.0)
MONO ABS: 0.9 10*3/uL (ref 0.2–1.0)
MONOS PCT: 22 %
NEUTROS PCT: 36 %
Neutro Abs: 1.5 10*3/uL (ref 1.4–6.5)
PLATELETS: 305 10*3/uL (ref 150–440)
RBC: 4.9 MIL/uL (ref 4.40–5.90)
RDW: 17.1 % — ABNORMAL HIGH (ref 11.5–14.5)
WBC: 4.3 10*3/uL (ref 3.8–10.6)

## 2017-01-20 NOTE — Assessment & Plan Note (Addendum)
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 Currently on Crizotinib 250 mg BID [since Nov]; CT scan 12/21/2016 shows mixed  response- improvement of the LUL mass [~4-5cm]; few mm increase in size of pre-tracheal LN.   # Tolerating well except for mild fatigue/ ? Worsening kidney function. HOLD Crizotinib starting today [also pt also has only 1 week more of supply; lost insurance].   # will re-evaluate kidney function; also availability of crizotinib- and at that time decide to continue oral therapy versus switched to chemotherapy carboplatin and Alimta. [Patient wants to be on oral pill if possible]  #  Chronic kidney disease stage III- creatinine 1.6/stable. S/p urology evaluation; nephrology evaluation. [?sec to Crizotinib versus obstruction at the level of the prostate]; US- kidney- no hydronephrosis.  # Bil LE swelling CKD/ Crizotinib- continue stockings for now.   # Bilateral PE left lower extremity DVT- on xarelto. Continue the same.  # follow up in 10 days/ [place]/labs.

## 2017-01-20 NOTE — Progress Notes (Signed)
Patient has not missed any dosing of Xalkori. He has 12 tablets left in his bottle. Financial barriers to care. RN Will Warden/ranger to follow-up on patient assistance application.

## 2017-01-20 NOTE — Progress Notes (Signed)
[place] NOTE  Patient Care Team: Roselee Nova, MD as PCP - General (Family Medicine)  CHIEF COMPLAINTS/PURPOSE OF CONSULTATION:  Lung cancer  #  Oncology History   # OCT 2017- ADENO CA LUNG; STAGE IV [; LUL; bil supraclavicular LN; Left neck LN Bx]; ROS-1 MUTATED; s/p Carbo-alimta x1[oct 2017]  # NOV 1st 2017- XALKORI 250 mg BID; JAN 15th CT- PR  # LLE DVT/bil PE/Multiple strokes [? On xarelto]-Lovenox; Jan mid 2018- xarelto [lovenox-insurance issues]  # MRI brain- multiple infarcts [2d echo/bubble study-NEG's/p Neurology eval]  # # s/p TURP [Sep 2017; Dr.Cope] DEC 26th CT- distended bladder   # MOLECULAR TESTING- ROS-1 POSITIVE; ALK/EGFR-NEG; PDL-1 EXPRESSION- 90%** [HIGH]     Primary cancer of left upper lobe of lung ([place])   06/12/2016 Initial Diagnosis    Primary cancer of left upper lobe of lung ([place])       HISTORY OF PRESENTING ILLNESS:  Melvin Willis 66 y.o.  male with newly diagnosedMetastatic lung cancer ROS- positive; Multiple strokes secondary to his metastatic lung cancer- Crizotinib  And xarelto is here for a follow up.   In the interim patient had a evaluation with a neck with ultrasound that was ordered by PCP given concerns for worsening lymphadenopathy in the neck. However ultrasound did not show any adenopathy.   Patient denies any shortness of breath or cough. Appetite is good. He is urinating well.  Otherwise no vomiting no diarrhea no constipation.  Denies any new lumps or bumps. He complains of mild swelling in the legs.This is not getting any worse.  ROS: A complete 10 point review of system is done which is negative except mentioned above in history of present illness  MEDICAL HISTORY:  Past Medical History:  Diagnosis Date  . Abnormal prostate specific antigen 08/08/2012  . Adiposity 04/16/2015  . Chronic kidney disease (CKD), stage III (moderate) 11/27/2016  . CVA (cerebral vascular accident) ([place]) 06/17/2016  .  Diabetes mellitus without complication ([place])   . Diverticulosis of sigmoid colon 04/16/2015  . Dyslipidemia 03/18/2015  . Hemorrhoids, internal 04/16/2015  . Hypercholesteremia 04/16/2015  . Hyperlipidemia   . Hypertension   . Primary cancer of left upper lobe of lung ([place])   . Pulmonary embolism ([place])   . Wears dentures    partial upper    SURGICAL HISTORY: Past Surgical History:  Procedure Laterality Date  . COLONOSCOPY    . COLONOSCOPY WITH PROPOFOL N/A 05/06/2015   Procedure: COLONOSCOPY WITH PROPOFOL;  Surgeon: Lucilla Lame, MD;  Location: [place];  Service: Endoscopy;  Laterality: N/A;  ASCENDING COLON POLYPS X 2 TERMINAL ILEUM BIOPSY RANDOM COLON BX. TRANSVERSE COLON POLYP SIGMOID COLON POLYP  . ESOPHAGOGASTRODUODENOSCOPY (EGD) WITH PROPOFOL N/A 05/06/2015   Procedure: ESOPHAGOGASTRODUODENOSCOPY (EGD) WITH PROPOFOL;  Surgeon: Lucilla Lame, MD;  Location: [facility];  Service: Endoscopy;  Laterality: N/A;  GASTRIC BIOPSY X1    SOCIAL HISTORY: Social History   Social History  . Marital status: Married    Spouse name: N/A  . Number of children: N/A  . Years of education: N/A   Occupational History  . Not on file.   Social History Main Topics  . Smoking status: Never Smoker  . Smokeless tobacco: Never Used  . Alcohol use No  . Drug use: No  . Sexual activity: Yes    Partners: Female   Other Topics Concern  . Not on file   Social History Narrative  . No narrative on  file    FAMILY HISTORY: Family History  Problem Relation Age of Onset  . Diabetes Mother   . Diabetes Father   . CAD Father   . Dementia Father   . Diabetes Sister   . Cancer Maternal Uncle        Prostate  . Cancer Cousin        prostate    ALLERGIES:  has No Known Allergies.  MEDICATIONS:  Current Outpatient Prescriptions  Medication Sig Dispense Refill  . atorvastatin (LIPITOR) 40 MG tablet Take 1 tablet (40 mg total) by mouth daily at 6 PM. 90 tablet 0  . crizotinib  (XALKORI) 250 MG capsule Take 1 capsule (250 mg total) by mouth 2 (two) times daily. 60 capsule 4  . finasteride (PROSCAR) 5 MG tablet Take 1 tablet (5 mg total) by mouth at bedtime. 14 tablet 0  . metFORMIN (GLUCOPHAGE) 1000 MG tablet Take 0.5 tablets (500 mg total) by mouth 2 (two) times daily. (Patient taking differently: Take 500 mg by mouth daily with breakfast. ) 90 tablet 0  . rivaroxaban (XARELTO) 20 MG TABS tablet Take 1 tablet (20 mg total) by mouth daily with supper. 30 tablet 5  . sitaGLIPtin (JANUVIA) 50 MG tablet Take 1 tablet (50 mg total) by mouth daily. 90 tablet 0  . tamsulosin (FLOMAX) 0.4 MG CAPS capsule Take 0.4 mg by mouth every morning.     . ondansetron (ZOFRAN) 8 MG tablet Take 1 tablet (8 mg total) by mouth every 8 (eight) hours as needed for nausea or vomiting (start 3 days; after chemo). (Patient not taking: Reported on 01/20/2017) 40 tablet 1  . prochlorperazine (COMPAZINE) 10 MG tablet Take 1 tablet (10 mg total) by mouth every 6 (six) hours as needed for nausea or vomiting. (Patient not taking: Reported on 01/20/2017) 40 tablet 1   No current facility-administered medications for this visit.       Marland Kitchen  PHYSICAL EXAMINATION: ECOG PERFORMANCE STATUS: 2 - Symptomatic, <50% confined to bed  Vitals:   01/20/17 1144  BP: (!) 142/80  Pulse: (!) 59  Resp: 18  Temp: 97.8 F (36.6 C)   Filed Weights   01/20/17 1144  Weight: 174 lb 8 oz (79.2 kg)    GENERAL: Well-nourished well-developed; Alert, no distress and comfortable.   With his wife.  EYES: no pallor or icterus OROPHARYNX: no thrush or ulceration; good dentition  NECK: supple, no masses felt LYMPH:  no palpable lymphadenopathy in the cervical, axillary or inguinal regions LUNGS: clear to auscultation and  No wheeze or crackles HEART/CVS: regular rate & rhythm and no murmurs; No lower extremity edema ABDOMEN: abdomen soft, non-tender and normal bowel sounds Musculoskeletal:no cyanosis of digits and no  clubbing  PSYCH: alert & oriented x 3 with fluent speech NEURO: no focal motor/sensory deficits SKIN:  no rashes or significant lesions  LABORATORY DATA:  I have reviewed the data as listed Lab Results  Component Value Date   WBC 4.3 01/20/2017   HGB 13.5 01/20/2017   HCT 40.0 01/20/2017   MCV 81.6 01/20/2017   PLT 305 01/20/2017    Recent Labs  12/25/16 1450 01/04/17 0933 01/20/17 1100  NA 133* 134* 135  K 4.4 5.1 4.6  CL 109 105 108  CO2 19* 21 20*  GLUCOSE 174* 154* 162*  BUN _0 CREATININE 1.57* 1.57* 1.64*  CALCIUM 8.7* 8.9 9.0  GFRNONAA 45* 46* 43*  GFRAA 52* 53* 50*  PROT 6.7 6.5 7.0  ALBUMIN 3.6 3.6 3.6  AST 34 29 35  ALT 68* 67* 65*  ALKPHOS 113 110 110  BILITOT 0.5 0.4 0.6    RADIOGRAPHIC STUDIES: I have personally reviewed the radiological images as listed and agreed with the findings in the report. US Soft Tissue Head/neck  Result Date: 01/08/2017 CLINICAL DATA:  Right supraclavicular mass x2 weeks EXAM: ULTRASOUND OF HEAD/NECK SOFT TISSUES TECHNIQUE: Ultrasound examination of the head and neck soft tissues was performed in the area of clinical concern. COMPARISON:  None. FINDINGS: No mass, cyst, adenopathy, aneurysm, abscess, or other pathology identified in the region of clinical concern. Limited contralateral images unremarkable. IMPRESSION: 1. No evident pathology on ultrasound. Electronically Signed   By: Lucrezia Europe M.D.   On: 01/08/2017 14:57    ASSESSMENT & PLAN:   Primary cancer of left upper lobe of lung ([place]) # Adenocarcinoma of the lung metastatic/stage IV- bilateral supraclavicular adenopathy; ROS-1 POSITIVE. Currently on Crizotinib 250 mg BID [since Nov]; CT scan 12/21/2016 shows mixed  response- improvement of the LUL mass [~4-5cm]; few mm increase in size of pre-tracheal LN.   # Tolerating well except for mild fatigue/ ? Worsening kidney function. HOLD Crizotinib starting today [also pt also has only 1 week more of supply; lost  insurance].   # will re-evaluate kidney function; also availability of crizotinib- and at that time decide to continue oral therapy versus switched to chemotherapy carboplatin and Alimta. [Patient wants to be on oral pill if possible]  #  Chronic kidney disease stage III- creatinine 1.6/stable. S/p urology evaluation; nephrology evaluation. [?sec to Crizotinib versus obstruction at the level of the prostate]; US- kidney- no hydronephrosis.  # Bil LE swelling CKD/ Crizotinib- continue stockings for now.   # Bilateral PE left lower extremity DVT- on xarelto. Continue the same.  # follow up in 10 days/ [place]/labs.      Cammie Sickle, MD 01/24/2017 6:56 PM

## 2017-01-20 NOTE — Telephone Encounter (Signed)
Contated patient per md order. Due to elevated creatinine, asked pt to hold his xalkori until pt sees md on 5/29. He gave verbal feedback.  Pt also made aware that Melvin Willis approved his application of xalkori- per md- proceed with new shipment of the drug from [employer], but do not start back on drug until instructed to do so.

## 2017-01-26 NOTE — Progress Notes (Unsigned)
PSN left patient a message to call back to discuss his financial concerns.

## 2017-01-27 ENCOUNTER — Other Ambulatory Visit: Payer: Self-pay | Admitting: Internal Medicine

## 2017-01-27 MED ORDER — RIVAROXABAN 20 MG PO TABS: 20.0000 mg | ORAL_TABLET | Freq: Every day | ORAL | 5 refills | Status: DC

## 2017-01-27 NOTE — Progress Notes (Unsigned)
Patient only has one more day supply of his Xerelto.  Patient instructed to go to [facility] tomorrow to pick up a 30 day supply of the drug at no cost to him [employer] to cover cost).  Patient to meet on 02/02/17 to apply for free drug from the [facility] patient assistance foundation.

## 2017-02-02 ENCOUNTER — Inpatient Hospital Stay: Payer: Self-pay

## 2017-02-02 ENCOUNTER — Inpatient Hospital Stay (HOSPITAL_BASED_OUTPATIENT_CLINIC_OR_DEPARTMENT_OTHER): Payer: Self-pay | Admitting: Internal Medicine

## 2017-02-02 VITALS — BP 146/78 | HR 68 | Temp 97.8°F | Wt 168.9 lb

## 2017-02-02 DIAGNOSIS — Z8673 Personal history of transient ischemic attack (TIA), and cerebral infarction without residual deficits: Secondary | ICD-10-CM

## 2017-02-02 DIAGNOSIS — I129 Hypertensive chronic kidney disease with stage 1 through stage 4 chronic kidney disease, or unspecified chronic kidney disease: Secondary | ICD-10-CM

## 2017-02-02 DIAGNOSIS — E7800 Pure hypercholesterolemia, unspecified: Secondary | ICD-10-CM

## 2017-02-02 DIAGNOSIS — Z7901 Long term (current) use of anticoagulants: Secondary | ICD-10-CM

## 2017-02-02 DIAGNOSIS — Z86718 Personal history of other venous thrombosis and embolism: Secondary | ICD-10-CM

## 2017-02-02 DIAGNOSIS — E119 Type 2 diabetes mellitus without complications: Secondary | ICD-10-CM

## 2017-02-02 DIAGNOSIS — Z8042 Family history of malignant neoplasm of prostate: Secondary | ICD-10-CM

## 2017-02-02 DIAGNOSIS — Z79899 Other long term (current) drug therapy: Secondary | ICD-10-CM

## 2017-02-02 DIAGNOSIS — R591 Generalized enlarged lymph nodes: Secondary | ICD-10-CM

## 2017-02-02 DIAGNOSIS — Z7984 Long term (current) use of oral hypoglycemic drugs: Secondary | ICD-10-CM

## 2017-02-02 DIAGNOSIS — M7989 Other specified soft tissue disorders: Secondary | ICD-10-CM

## 2017-02-02 DIAGNOSIS — Z8719 Personal history of other diseases of the digestive system: Secondary | ICD-10-CM

## 2017-02-02 DIAGNOSIS — R5383 Other fatigue: Secondary | ICD-10-CM

## 2017-02-02 DIAGNOSIS — N183 Chronic kidney disease, stage 3 (moderate): Secondary | ICD-10-CM

## 2017-02-02 DIAGNOSIS — R972 Elevated prostate specific antigen [PSA]: Secondary | ICD-10-CM

## 2017-02-02 DIAGNOSIS — Z86711 Personal history of pulmonary embolism: Secondary | ICD-10-CM

## 2017-02-02 DIAGNOSIS — C770 Secondary and unspecified malignant neoplasm of lymph nodes of head, face and neck: Secondary | ICD-10-CM

## 2017-02-02 DIAGNOSIS — C3412 Malignant neoplasm of upper lobe, left bronchus or lung: Secondary | ICD-10-CM

## 2017-02-02 LAB — COMPREHENSIVE METABOLIC PANEL
ALBUMIN: 3.4 g/dL — AB (ref 3.5–5.0)
ALT: 42 U/L (ref 17–63)
AST: 23 U/L (ref 15–41)
Alkaline Phosphatase: 99 U/L (ref 38–126)
Anion gap: 7 (ref 5–15)
BILIRUBIN TOTAL: 0.6 mg/dL (ref 0.3–1.2)
BUN: 16 mg/dL (ref 6–20)
CO2: 20 mmol/L — ABNORMAL LOW (ref 22–32)
Calcium: 8.8 mg/dL — ABNORMAL LOW (ref 8.9–10.3)
Chloride: 107 mmol/L (ref 101–111)
Creatinine, Ser: 1.29 mg/dL — ABNORMAL HIGH (ref 0.61–1.24)
GFR calc Af Amer: >60 mL/min (ref >60)
GFR calc non Af Amer: 57 mL/min — ABNORMAL LOW (ref >60)
GLUCOSE: 160 mg/dL — AB (ref 65–99)
POTASSIUM: 4.2 mmol/L (ref 3.5–5.1)
Sodium: 134 mmol/L — ABNORMAL LOW (ref 135–145)
TOTAL PROTEIN: 7.1 g/dL (ref 6.5–8.1)

## 2017-02-02 LAB — CBC WITH DIFFERENTIAL/PLATELET
BASOS ABS: 0 10*3/uL (ref 0–0.1)
BASOS PCT: 0 %
Eosinophils Absolute: 0.3 10*3/uL (ref 0–0.7)
Eosinophils Relative: 6 %
HEMATOCRIT: 40.7 % (ref 40.0–52.0)
HEMOGLOBIN: 13.4 g/dL (ref 13.0–18.0)
Lymphocytes Relative: 36 %
Lymphs Abs: 1.6 10*3/uL (ref 1.0–3.6)
MCH: 27.4 pg (ref 26.0–34.0)
MCHC: 32.8 g/dL (ref 32.0–36.0)
MCV: 83.4 fL (ref 80.0–100.0)
MONO ABS: 0.7 10*3/uL (ref 0.2–1.0)
Monocytes Relative: 15 %
NEUTROS PCT: 43 %
Neutro Abs: 1.9 10*3/uL (ref 1.4–6.5)
Platelets: 249 10*3/uL (ref 150–440)
RBC: 4.89 MIL/uL (ref 4.40–5.90)
RDW: 17.8 % — AB (ref 11.5–14.5)
WBC: 4.5 10*3/uL (ref 3.8–10.6)

## 2017-02-02 NOTE — Progress Notes (Signed)
Patient here today for follow up.   

## 2017-02-02 NOTE — Progress Notes (Signed)
[place] NOTE  Patient Care Team: Roselee Nova, MD as PCP - General (Family Medicine)  CHIEF COMPLAINTS/PURPOSE OF CONSULTATION:  Lung cancer  #  Oncology History   # OCT 2017- ADENO CA LUNG; STAGE IV [; LUL; bil supraclavicular LN; Left neck LN Bx]; ROS-1 MUTATED; s/p Carbo-alimta x1[oct 2017]  # NOV 1st 2017- XALKORI 250 mg BID; JAN 15th CT- PR  # LLE DVT/bil PE/Multiple strokes [? On xarelto]-Lovenox; Jan mid 2018- xarelto [lovenox-insurance issues]  # MRI brain- multiple infarcts [2d echo/bubble study-NEG's/p Neurology eval]  # # s/p TURP [Sep 2017; Dr.Cope] DEC 26th CT- distended bladder   # MOLECULAR TESTING- ROS-1 POSITIVE; ALK/EGFR-NEG; PDL-1 EXPRESSION- 90%** [HIGH]     Primary cancer of left upper lobe of lung ([place])   06/12/2016 Initial Diagnosis    Primary cancer of left upper lobe of lung ([place])       HISTORY OF PRESENTING ILLNESS:  Melvin Willis 66 y.o.  male with newly diagnosedMetastatic lung cancer ROS- positive; Multiple strokes secondary to his metastatic lung cancer- Crizotinib  And xarelto is here for a follow up.   Crizotinib was held at last visit approximately 10 days ago for his worsening renal function-creatinine 1.67 [creatinine baseline 1.2-1.3]. Patient denies any new lumps or bumps.   Patient denies any shortness of breath or cough. Appetite is good. He is urinating well.  Otherwise no vomiting no diarrhea no constipation. He complains of mild swelling in the legs-awaiting the stockings..This is not getting any worse.  ROS: A complete 10 point review of system is done which is negative except mentioned above in history of present illness  MEDICAL HISTORY:  Past Medical History:  Diagnosis Date  . Abnormal prostate specific antigen 08/08/2012  . Adiposity 04/16/2015  . Chronic kidney disease (CKD), stage III (moderate) 11/27/2016  . CVA (cerebral vascular accident) ([place]) 06/17/2016  . Diabetes mellitus without  complication ([place])   . Diverticulosis of sigmoid colon 04/16/2015  . Dyslipidemia 03/18/2015  . Hemorrhoids, internal 04/16/2015  . Hypercholesteremia 04/16/2015  . Hyperlipidemia   . Hypertension   . Primary cancer of left upper lobe of lung ([place])   . Pulmonary embolism ([place])   . Wears dentures    partial upper    SURGICAL HISTORY: Past Surgical History:  Procedure Laterality Date  . COLONOSCOPY    . COLONOSCOPY WITH PROPOFOL N/A 05/06/2015   Procedure: COLONOSCOPY WITH PROPOFOL;  Surgeon: Lucilla Lame, MD;  Location: [place];  Service: Endoscopy;  Laterality: N/A;  ASCENDING COLON POLYPS X 2 TERMINAL ILEUM BIOPSY RANDOM COLON BX. TRANSVERSE COLON POLYP SIGMOID COLON POLYP  . ESOPHAGOGASTRODUODENOSCOPY (EGD) WITH PROPOFOL N/A 05/06/2015   Procedure: ESOPHAGOGASTRODUODENOSCOPY (EGD) WITH PROPOFOL;  Surgeon: Lucilla Lame, MD;  Location: [place];  Service: Endoscopy;  Laterality: N/A;  GASTRIC BIOPSY X1    SOCIAL HISTORY: Social History   Social History  . Marital status: Married    Spouse name: N/A  . Number of children: N/A  . Years of education: N/A   Occupational History  . Not on file.   Social History Main Topics  . Smoking status: Never Smoker  . Smokeless tobacco: Never Used  . Alcohol use No  . Drug use: No  . Sexual activity: Yes    Partners: Female   Other Topics Concern  . Not on file   Social History Narrative  . No narrative on file    FAMILY HISTORY: Family History  Problem Relation  Age of Onset  . Diabetes Mother   . Diabetes Father   . CAD Father   . Dementia Father   . Diabetes Sister   . Cancer Maternal Uncle        Prostate  . Cancer Cousin        prostate    ALLERGIES:  has No Known Allergies.  MEDICATIONS:  Current Outpatient Prescriptions  Medication Sig Dispense Refill  . atorvastatin (LIPITOR) 40 MG tablet Take 1 tablet (40 mg total) by mouth daily at 6 PM. 90 tablet 0  . crizotinib (XALKORI) 250 MG capsule  Take 1 capsule (250 mg total) by mouth 2 (two) times daily. 60 capsule 4  . finasteride (PROSCAR) 5 MG tablet Take 1 tablet (5 mg total) by mouth at bedtime. 14 tablet 0  . metFORMIN (GLUCOPHAGE) 1000 MG tablet Take 0.5 tablets (500 mg total) by mouth 2 (two) times daily. (Patient taking differently: Take 500 mg by mouth daily with breakfast. ) 90 tablet 0  . ondansetron (ZOFRAN) 8 MG tablet Take 1 tablet (8 mg total) by mouth every 8 (eight) hours as needed for nausea or vomiting (start 3 days; after chemo). 40 tablet 1  . prochlorperazine (COMPAZINE) 10 MG tablet Take 1 tablet (10 mg total) by mouth every 6 (six) hours as needed for nausea or vomiting. 40 tablet 1  . rivaroxaban (XARELTO) 20 MG TABS tablet Take 1 tablet (20 mg total) by mouth daily with supper. 30 tablet 5  . sitaGLIPtin (JANUVIA) 50 MG tablet Take 1 tablet (50 mg total) by mouth daily. 90 tablet 0  . tamsulosin (FLOMAX) 0.4 MG CAPS capsule Take 0.4 mg by mouth every morning.      No current facility-administered medications for this visit.       .  PHYSICAL EXAMINATION: ECOG PERFORMANCE STATUS: 2 - Symptomatic, <50% confined to bed  Vitals:   02/02/17 0902  BP: (!) 146/78  Pulse: 68  Temp: 97.8 F (36.6 C)   Filed Weights   02/02/17 0902  Weight: 168 lb 14 oz (76.6 kg)    GENERAL: Well-nourished well-developed; Alert, no distress and comfortable.   With his wife.  EYES: no pallor or icterus OROPHARYNX: no thrush or ulceration; good dentition  NECK: supple, no masses felt LYMPH:  no palpable lymphadenopathy in the cervical, axillary or inguinal regions LUNGS: clear to auscultation and  No wheeze or crackles HEART/CVS: regular rate & rhythm and no murmurs; No lower extremity edema ABDOMEN: abdomen soft, non-tender and normal bowel sounds Musculoskeletal:no cyanosis of digits and no clubbing  PSYCH: alert & oriented x 3 with fluent speech NEURO: no focal motor/sensory deficits SKIN:  no rashes or significant  lesions  LABORATORY DATA:  I have reviewed the data as listed Lab Results  Component Value Date   WBC 4.5 02/02/2017   HGB 13.4 02/02/2017   HCT 40.7 02/02/2017   MCV 83.4 02/02/2017   PLT 249 02/02/2017    Recent Labs  01/04/17 0933 01/20/17 1100 02/02/17 0839  NA 134* 135 134*  K 5.1 4.6 4.2  CL 105 108 107  CO2 21 20* 20*  GLUCOSE 154* 162* 160*  BUN 18 19 16  CREATININE 1.57* 1.64* 1.29*  CALCIUM 8.9 9.0 8.8*  GFRNONAA 46* 43* 57*  GFRAA 53* 50* >60  PROT 6.5 7.0 7.1  ALBUMIN 3.6 3.6 3.4*  AST 29 35 23  ALT 67* 65* 42  ALKPHOS 110 110 99  BILITOT 0.4 0.6 0.6      RADIOGRAPHIC STUDIES: I have personally reviewed the radiological images as listed and agreed with the findings in the report. US Soft Tissue Head/neck  Result Date: 01/08/2017 CLINICAL DATA:  Right supraclavicular mass x2 weeks EXAM: ULTRASOUND OF HEAD/NECK SOFT TISSUES TECHNIQUE: Ultrasound examination of the head and neck soft tissues was performed in the area of clinical concern. COMPARISON:  None. FINDINGS: No mass, cyst, adenopathy, aneurysm, abscess, or other pathology identified in the region of clinical concern. Limited contralateral images unremarkable. IMPRESSION: 1. No evident pathology on ultrasound. Electronically Signed   By: Lucrezia Europe M.D.   On: 01/08/2017 14:57    ASSESSMENT & PLAN:   Primary cancer of left upper lobe of lung ([place]) # Adenocarcinoma of the lung metastatic/stage IV- bilateral supraclavicular adenopathy; ROS-1 POSITIVE. Currently on Crizotinib 250 mg BID [since Nov]; CT scan 12/21/2016 shows mixed  response- improvement of the LUL mass [~4-5cm]; few mm increase in size of pre-tracheal LN.   # Continue Crizotinib- for now [facility] discussion regarding renal dysfunction]. Patient tolerating well except for mild renal insufficiency. We will check a CT scan of the chest on contrast in mid July 2018.  # CKD- multifactorial [prostatitic obstruction/diabetes]- slight improvement of the  renal function after holding Crizotinib [today-creatinine 1.2; improved from 1.6]. Patient understands; but wants to continue crizotinib- as is tolerating it well otherwise. We'll monitor renal function closely.  # Bil LE swelling CKD/ Crizotinib-STABLE. Continue stockings for now.   # Bilateral PE left lower extremity DVT- on xarelto. Continue the same.  # follow up in mid July/labs/CT scan prior; in 3 weeks-cbc/cmp.  # Patient is meeting with Jack/social worker- re: insurance/medication issues.      Cammie Sickle, MD 02/02/2017 9:32 AM

## 2017-02-02 NOTE — Assessment & Plan Note (Addendum)
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 Currently on Crizotinib 250 mg BID [since Nov]; CT scan 12/21/2016 shows mixed  response- improvement of the LUL mass [~4-5cm]; few mm increase in size of pre-tracheal LN.   # Continue Crizotinib- for now [facility] discussion regarding renal dysfunction]. Patient tolerating well except for mild renal insufficiency. We will check a CT scan of the chest on contrast in mid July 2018.  # CKD- multifactorial [prostatitic obstruction/diabetes]- slight improvement of the renal function after holding Crizotinib [today-creatinine 1.2; improved from 1.6]. Patient understands; but wants to continue crizotinib- as is tolerating it well otherwise. We'll monitor renal function closely.  # Bil LE swelling CKD/ Crizotinib-STABLE. Continue stockings for now.   # Bilateral PE left lower extremity DVT- on xarelto. Continue the same.  # follow up in mid July/labs/CT scan prior; in 3 weeks-cbc/cmp.  # Patient is meeting with Jack/social worker- re: insurance/medication issues.

## 2017-02-05 ENCOUNTER — Telehealth: Payer: Self-pay | Admitting: Family Medicine

## 2017-02-05 NOTE — Telephone Encounter (Signed)
Pt needs refill on Januvia.  [place] He has an appt on 02/23/2017

## 2017-02-08 NOTE — Telephone Encounter (Signed)
Called pt back to let him know he still has refills on this medication. Pt does not have insurance and is going to check the price of self pay to see if he can afford it. Pt will call back if he has a problem.

## 2017-02-23 ENCOUNTER — Inpatient Hospital Stay: Payer: PRIVATE HEALTH INSURANCE | Attending: Internal Medicine

## 2017-02-23 ENCOUNTER — Telehealth: Payer: Self-pay | Admitting: *Deleted

## 2017-02-23 ENCOUNTER — Encounter: Payer: Self-pay | Admitting: Family Medicine

## 2017-02-23 ENCOUNTER — Ambulatory Visit (INDEPENDENT_AMBULATORY_CARE_PROVIDER_SITE_OTHER): Payer: PRIVATE HEALTH INSURANCE | Admitting: Family Medicine

## 2017-02-23 VITALS — BP 138/76 | HR 76 | Temp 98.0°F | Resp 16 | Ht 64.0 in | Wt 167.7 lb

## 2017-02-23 DIAGNOSIS — C3412 Malignant neoplasm of upper lobe, left bronchus or lung: Secondary | ICD-10-CM | POA: Insufficient documentation

## 2017-02-23 DIAGNOSIS — Z1159 Encounter for screening for other viral diseases: Secondary | ICD-10-CM | POA: Diagnosis not present

## 2017-02-23 DIAGNOSIS — Z0000 Encounter for general adult medical examination without abnormal findings: Secondary | ICD-10-CM | POA: Diagnosis not present

## 2017-02-23 LAB — COMPREHENSIVE METABOLIC PANEL
ALK PHOS: 94 U/L (ref 38–126)
ALT: 64 U/L — AB (ref 17–63)
ANION GAP: 6 (ref 5–15)
AST: 32 U/L (ref 15–41)
Albumin: 3.7 g/dL (ref 3.5–5.0)
BUN: 21 mg/dL — ABNORMAL HIGH (ref 6–20)
CALCIUM: 8.6 mg/dL — AB (ref 8.9–10.3)
CO2: 21 mmol/L — AB (ref 22–32)
CREATININE: 1.62 mg/dL — AB (ref 0.61–1.24)
Chloride: 110 mmol/L (ref 101–111)
GFR, EST AFRICAN AMERICAN: 51 mL/min — AB (ref >60)
GFR, EST NON AFRICAN AMERICAN: 44 mL/min — AB (ref >60)
Glucose, Bld: 159 mg/dL — ABNORMAL HIGH (ref 65–99)
Potassium: 4.4 mmol/L (ref 3.5–5.1)
SODIUM: 137 mmol/L (ref 135–145)
Total Bilirubin: 0.5 mg/dL (ref 0.3–1.2)
Total Protein: 6.8 g/dL (ref 6.5–8.1)

## 2017-02-23 LAB — CBC WITH DIFFERENTIAL/PLATELET
Basophils Absolute: 0 10*3/uL (ref 0–0.1)
Basophils Relative: 1 %
EOS ABS: 0.3 10*3/uL (ref 0–0.7)
EOS PCT: 6 %
HCT: 39.4 % — ABNORMAL LOW (ref 40.0–52.0)
HEMOGLOBIN: 13.4 g/dL (ref 13.0–18.0)
LYMPHS ABS: 1.7 10*3/uL (ref 1.0–3.6)
LYMPHS PCT: 33 %
MCH: 28.3 pg (ref 26.0–34.0)
MCHC: 34.1 g/dL (ref 32.0–36.0)
MCV: 83.1 fL (ref 80.0–100.0)
MONOS PCT: 15 %
Monocytes Absolute: 0.8 10*3/uL (ref 0.2–1.0)
Neutro Abs: 2.2 10*3/uL (ref 1.4–6.5)
Neutrophils Relative %: 45 %
Platelets: 304 10*3/uL (ref 150–440)
RBC: 4.74 MIL/uL (ref 4.40–5.90)
RDW: 17.1 % — ABNORMAL HIGH (ref 11.5–14.5)
WBC: 5 10*3/uL (ref 3.8–10.6)

## 2017-02-23 NOTE — Progress Notes (Signed)
Creat was 1.29 3 weeks ago but otherwise always around 1.5-1.6 which is his recent baseline. Continue crizotinib

## 2017-02-23 NOTE — Progress Notes (Signed)
Name: Melvin Willis   MRN: [id number]    DOB: 23-Sep-1950   Date:02/23/2017       Progress Note  Subjective  Chief Complaint  Chief Complaint  Patient presents with  . Annual Exam    CPE    HPI  Pt. Presents for Complete Physical Exam. He is followed by urology for BPH and gets DRE and PSA done at that time. Colonoscopy completed in August 2016.      Past Medical History:  Diagnosis Date  . Abnormal prostate specific antigen 08/08/2012  . Adiposity 04/16/2015  . Chronic kidney disease (CKD), stage III (moderate) 11/27/2016  . CVA (cerebral vascular accident) ([place]) 06/17/2016  . Diabetes mellitus without complication ([place])   . Diverticulosis of sigmoid colon 04/16/2015  . Dyslipidemia 03/18/2015  . Hemorrhoids, internal 04/16/2015  . Hypercholesteremia 04/16/2015  . Hyperlipidemia   . Hypertension   . Primary cancer of left upper lobe of lung ([place])   . Pulmonary embolism ([place])   . Wears dentures    partial upper    Past Surgical History:  Procedure Laterality Date  . COLONOSCOPY    . COLONOSCOPY WITH PROPOFOL N/A 05/06/2015   Procedure: COLONOSCOPY WITH PROPOFOL;  Surgeon: Lucilla Lame, MD;  Location: [place];  Service: Endoscopy;  Laterality: N/A;  ASCENDING COLON POLYPS X 2 TERMINAL ILEUM BIOPSY RANDOM COLON BX. TRANSVERSE COLON POLYP SIGMOID COLON POLYP  . ESOPHAGOGASTRODUODENOSCOPY (EGD) WITH PROPOFOL N/A 05/06/2015   Procedure: ESOPHAGOGASTRODUODENOSCOPY (EGD) WITH PROPOFOL;  Surgeon: Lucilla Lame, MD;  Location: [place];  Service: Endoscopy;  Laterality: N/A;  GASTRIC BIOPSY X1    Family History  Problem Relation Age of Onset  . Diabetes Mother   . Diabetes Father   . CAD Father   . Dementia Father   . Diabetes Sister   . Cancer Maternal Uncle        Prostate  . Cancer Cousin        prostate    Social History   Social History  . Marital status: Married    Spouse name: N/A  . Number of children: N/A  . Years of education: N/A    Occupational History  . Not on file.   Social History Main Topics  . Smoking status: Never Smoker  . Smokeless tobacco: Never Used  . Alcohol use No  . Drug use: No  . Sexual activity: Yes    Partners: Female   Other Topics Concern  . Not on file   Social History Narrative  . No narrative on file     Current Outpatient Prescriptions:  .  atorvastatin (LIPITOR) 40 MG tablet, Take 1 tablet (40 mg total) by mouth daily at 6 PM., Disp: 90 tablet, Rfl: 0 .  crizotinib (XALKORI) 250 MG capsule, Take 1 capsule (250 mg total) by mouth 2 (two) times daily., Disp: 60 capsule, Rfl: 4 .  finasteride (PROSCAR) 5 MG tablet, Take 1 tablet (5 mg total) by mouth at bedtime., Disp: 14 tablet, Rfl: 0 .  metFORMIN (GLUCOPHAGE) 1000 MG tablet, Take 0.5 tablets (500 mg total) by mouth 2 (two) times daily. (Patient taking differently: Take 500 mg by mouth daily with breakfast. ), Disp: 90 tablet, Rfl: 0 .  ondansetron (ZOFRAN) 8 MG tablet, Take 1 tablet (8 mg total) by mouth every 8 (eight) hours as needed for nausea or vomiting (start 3 days; after chemo)., Disp: 40 tablet, Rfl: 1 .  prochlorperazine (COMPAZINE) 10 MG tablet, Take 1 tablet (10 mg total)  by mouth every 6 (six) hours as needed for nausea or vomiting., Disp: 40 tablet, Rfl: 1 .  rivaroxaban (XARELTO) 20 MG TABS tablet, Take 1 tablet (20 mg total) by mouth daily with supper., Disp: 30 tablet, Rfl: 5 .  tamsulosin (FLOMAX) 0.4 MG CAPS capsule, Take 0.4 mg by mouth every morning. , Disp: , Rfl:  .  sitaGLIPtin (JANUVIA) 50 MG tablet, Take 1 tablet (50 mg total) by mouth daily. (Patient not taking: Reported on 02/23/2017), Disp: 90 tablet, Rfl: 0  No Known Allergies   Review of Systems  Constitutional: Negative for chills, fever, malaise/fatigue and weight loss.  HENT: Negative for congestion, ear pain and sore throat.   Eyes: Negative for blurred vision and double vision.  Respiratory: Negative for cough and shortness of breath.    Cardiovascular: Positive for leg swelling. Negative for chest pain.  Gastrointestinal: Negative for blood in stool, nausea and vomiting.  Genitourinary: Negative for dysuria and hematuria.  Musculoskeletal: Negative for back pain and neck pain.  Neurological: Negative for dizziness and headaches.  Psychiatric/Behavioral: Negative for depression. The patient is not nervous/anxious and does not have insomnia.      Objective  Vitals:   02/23/17 1342  BP: 138/76  Pulse: 76  Resp: 16  Temp: 98 F (36.7 C)  TempSrc: Oral  SpO2: 96%  Weight: 167 lb 11.2 oz (76.1 kg)  Height: 5\' 4"  (1.626 m)    Physical Exam  Constitutional: He is oriented to person, place, and time and well-developed, well-nourished, and in no distress.  HENT:  Head: Normocephalic and atraumatic.  Left Ear: External ear normal.  Cerumen impaction in right ear canal  Eyes: Pupils are equal, round, and reactive to light.  Neck: Neck supple.  Cardiovascular: Normal rate and regular rhythm.   Pulmonary/Chest: Effort normal and breath sounds normal. He has no wheezes.  Abdominal: Soft. Bowel sounds are normal. There is no tenderness.  Musculoskeletal: He exhibits no edema.  Neurological: He is alert and oriented to person, place, and time.  Psychiatric: Mood, memory, affect and judgment normal.  Nursing note and vitals reviewed.     Assessment & Plan  1. Annual physical exam Completed, up-to-date on screening  2. Need for hepatitis C screening test  - Hepatitis C antibody    Ryiah Bellissimo [facility] 02/23/2017 1:59 PM

## 2017-02-23 NOTE — Telephone Encounter (Signed)
Per pt's request - contacted him regarding his lab results. Pt concerned about the renal function. Discussed results with on call provider-results clinical stable. Pt instructed to continue taking crizotinib. Teach back process performed.

## 2017-02-23 NOTE — Telephone Encounter (Signed)
-----   Message from Sindy Guadeloupe, MD sent at 02/23/2017 10:34 AM EDT ----- Creat was 1.29 3 weeks ago but otherwise always around 1.5-1.6 which is his recent baseline. Continue crizotinib

## 2017-02-24 LAB — HEPATITIS C ANTIBODY: HCV AB: NEGATIVE

## 2017-03-03 ENCOUNTER — Other Ambulatory Visit: Payer: Self-pay | Admitting: *Deleted

## 2017-03-03 MED ORDER — RIVAROXABAN 20 MG PO TABS: 20.0000 mg | ORAL_TABLET | Freq: Every day | ORAL | 5 refills | Status: DC

## 2017-03-03 MED ORDER — CRIZOTINIB 250 MG PO CAPS: 250.0000 mg | ORAL_CAPSULE | Freq: Two times a day (BID) | ORAL | 4 refills | Status: DC

## 2017-03-04 ENCOUNTER — Other Ambulatory Visit: Payer: Self-pay | Admitting: Family Medicine

## 2017-03-04 DIAGNOSIS — E119 Type 2 diabetes mellitus without complications: Secondary | ICD-10-CM

## 2017-03-05 ENCOUNTER — Telehealth: Payer: Self-pay | Admitting: *Deleted

## 2017-03-05 NOTE — Telephone Encounter (Signed)
Patient called and requested to speak to Nira Conn, RN regarding his Xarelto. [facility] states that [facility] is charging him $400 out of pocket for the medication. I spoke with Elease Etienne, pt has already applied for drug assistance. Pt was informed 2 days ago that the copay card would be mailed to him. Pt previously helped with $500 for the xarelto.  Pt states that he is completely out of the xarelto.  I spoke to the pharmacy in cancer center. There is a 7 day supply readily available. I spoke with Dr. Rogue Bussing v/o to dispense samples to pt until copay card is available.

## 2017-03-08 ENCOUNTER — Telehealth: Payer: Self-pay | Admitting: *Deleted

## 2017-03-08 NOTE — Telephone Encounter (Signed)
RN returned phone call to [facility] at [employer] [facility]. RN spoke with Leana Roe.  per Leana Roe, pt contacted the fiancial assistance office and stated that he no longer needs their assistance with the [facility] that he is obtaining the medication from alternative means. However pt needs assistance for xarelto. I explained to Tracie that pt previously applied through [employer] for the drug assitance almost 2 months ago for Xalkori prescription-(via [employer] meds assistance). He should be receiving the assistance from [employer]. however, I just spoke with the c.ctr. social worker last Friday. pt already had an application submitted for pt assistance last week. I was informed by social worker that the pt should have a copay card mailed to him sometime this week for the xarelto. She stated that their company does not dispense copay cards and she needs Dr. B to sign forms for [employer]. I explained that Dr. B received a letter on 6/11 stating denial of approval for xarelto. Confirmed with Leana Roe that pt submitted this particular application for xarelto on 6/28. She was not previously aware that the pt had applied through [employer] nor was the caller aware of the denial with [employer]. She asked me to fax her a copy of the deniel letter from [employer] and application for [employer] (for xalkori coverage) to fax: [phone]. I explained that the pt is receiving funding (to the best of my knowledge) the pt is receiving funding through [employer].   i called [employer] and confirmed that pt's Hulda Humphrey was shipped to pt and delivered to patient on Friday 03/05/17/ pt was approved for assistance until until 01/21/2018.  I left a msg with Elease Etienne in cancer center social work to return my phone call. I will need to determine if he had helped pt with [employer] application or if he knew pt was applying through this [employer] program. Per Barnabas Lister - He left a msg with [employer] to follow-up. [employer] is officially  closed for the week. He is waiting for call back from the on-call staff at [employer]. Barnabas Lister has not helped the patient with any funding application from [employer]. Barnabas Lister was not aware pt was denied from [employer]. I explained that this letter was sent to Dr. Rogue Bussing, while md was out of the office.

## 2017-03-08 NOTE — Telephone Encounter (Signed)
Patient assistance program rep Kylie called to see if we received the application for free drug that she faxed over and states she is faxing another just in case.

## 2017-03-09 NOTE — Progress Notes (Unsigned)
PSN assisted patient in applying to [place] patient assistance foundation to obtain free Xarelto since patient lost his drug coverage.  The application was faxed on June 5th, 2018.  PSN paid for a month's supply of the drug that day through the [place].  PSN called Wynetta Emery and [place] last week.  Representative from [employer] stated that all needed paperwork was submitted and patient would be approved for a pharmacy card to use at his pharmacy to obtain free drug.  PSN called [employer] back yesterday, because patient is out of drug, to find out when copay card was going to arrive to patient.  [employer] is closed for the week due to the holiday, but would have someone call back for important issues.  PSN left a message asking that someone call back about patient's pharmacy card.  No return call has been received at this time.

## 2017-03-11 ENCOUNTER — Telehealth: Payer: Self-pay | Admitting: *Deleted

## 2017-03-11 ENCOUNTER — Other Ambulatory Visit: Payer: Self-pay | Admitting: *Deleted

## 2017-03-11 MED ORDER — RIVAROXABAN 20 MG PO TABS: 20.0000 mg | ORAL_TABLET | Freq: Every day | ORAL | 4 refills | Status: DC

## 2017-03-11 NOTE — Telephone Encounter (Signed)
Pt left vm on triage- requesting a phone call returned by Nira Conn, [place].  Call returned- rn spoke with patient. He "still has not received any copay card from [employer]. I explained that according to a fax from [employer] denied him pt assistance. I explained to him that Barnabas Lister has as left msg to f/u on this. He stated that he applied through [employer] [facility] for assistance as this is what "[facility] recommended."  Will need 90 day rx fax and mailed to [employer] [facility]- currently pending md signature on rx.  Pt will be provided with samples 2 weeks supply until drug coverage assistance can be obtained.

## 2017-03-12 NOTE — Telephone Encounter (Signed)
2 week sample of Xarelto picked up by patient.

## 2017-03-22 ENCOUNTER — Ambulatory Visit
Admission: RE | Admit: 2017-03-22 | Discharge: 2017-03-22 | Disposition: A | Discharge status: Home / Self Care | Payer: PRIVATE HEALTH INSURANCE | Source: Ambulatory Visit | Attending: Internal Medicine | Admitting: Internal Medicine

## 2017-03-22 DIAGNOSIS — C3412 Malignant neoplasm of upper lobe, left bronchus or lung: Secondary | ICD-10-CM

## 2017-03-22 DIAGNOSIS — R591 Generalized enlarged lymph nodes: Secondary | ICD-10-CM | POA: Diagnosis not present

## 2017-03-25 ENCOUNTER — Telehealth: Payer: Self-pay | Admitting: *Deleted

## 2017-03-25 NOTE — Telephone Encounter (Signed)
Patient called still has not received information from [employer] for final decisions on drug coverage. Pt needs samples to last him another week or more until this taking care of.  I explained to patient - that I had to mail his 90 day rx fax and mailed to [employer] [facility] approx. 2 weeks ago.  I also faxed his application with md's signatures x 2 to [employer] [facility] and have made phone calls to [employer] [facility] to determine the status of the prescription assistance. My last phone call was on Tuesday 7/24.  Tracie at [employer] [facility] requested that the application for [employer] that was completed previously to be also faxed to [employer].   I explained to her that I did not have a copy on the chart of the application from [employer]; only the denial letter was in our media.  She provided me with a new application with the patient's signature on the documents and asked Dr. Rogue Bussing to complete his new portion.  This information is needed as [employer] uses [employer] to help pt with coverage. She stated that they have a contract with [employer] to provide prescription coverage just for the xarelto.  I left a vm for the cancer center pharmacy team to see if samples could be obtained for patient until patient can obtain coverage.  I will reach back to the patient tomorrow to let him know the status on samples.

## 2017-03-26 NOTE — Telephone Encounter (Signed)
Arranged for patient to pick up xarelto samples today at 10 pm. Pt informed that after today, the clinic does not have any more samples in stock. Will continue to follow-up on pt's copay assistance with [employer].  I have sent Elease Etienne in c ctr social worker a msg regarding an update in this pt's care

## 2017-03-26 NOTE — Telephone Encounter (Signed)
Patient picked up xarelto samples.

## 2017-03-29 ENCOUNTER — Inpatient Hospital Stay: Payer: PRIVATE HEALTH INSURANCE | Attending: Internal Medicine | Admitting: Internal Medicine

## 2017-03-29 ENCOUNTER — Inpatient Hospital Stay: Payer: PRIVATE HEALTH INSURANCE

## 2017-03-29 VITALS — BP 131/73 | HR 47 | Temp 96.5°F | Resp 16 | Wt 169.0 lb

## 2017-03-29 DIAGNOSIS — I129 Hypertensive chronic kidney disease with stage 1 through stage 4 chronic kidney disease, or unspecified chronic kidney disease: Secondary | ICD-10-CM | POA: Insufficient documentation

## 2017-03-29 DIAGNOSIS — Z7901 Long term (current) use of anticoagulants: Secondary | ICD-10-CM | POA: Insufficient documentation

## 2017-03-29 DIAGNOSIS — Z8719 Personal history of other diseases of the digestive system: Secondary | ICD-10-CM | POA: Insufficient documentation

## 2017-03-29 DIAGNOSIS — E1122 Type 2 diabetes mellitus with diabetic chronic kidney disease: Secondary | ICD-10-CM | POA: Insufficient documentation

## 2017-03-29 DIAGNOSIS — Z8042 Family history of malignant neoplasm of prostate: Secondary | ICD-10-CM | POA: Diagnosis not present

## 2017-03-29 DIAGNOSIS — Z86718 Personal history of other venous thrombosis and embolism: Secondary | ICD-10-CM | POA: Diagnosis not present

## 2017-03-29 DIAGNOSIS — Z86711 Personal history of pulmonary embolism: Secondary | ICD-10-CM | POA: Diagnosis not present

## 2017-03-29 DIAGNOSIS — N183 Chronic kidney disease, stage 3 (moderate): Secondary | ICD-10-CM | POA: Insufficient documentation

## 2017-03-29 DIAGNOSIS — E119 Type 2 diabetes mellitus without complications: Secondary | ICD-10-CM | POA: Diagnosis not present

## 2017-03-29 DIAGNOSIS — E7800 Pure hypercholesterolemia, unspecified: Secondary | ICD-10-CM | POA: Diagnosis not present

## 2017-03-29 DIAGNOSIS — Z79899 Other long term (current) drug therapy: Secondary | ICD-10-CM | POA: Insufficient documentation

## 2017-03-29 DIAGNOSIS — C3412 Malignant neoplasm of upper lobe, left bronchus or lung: Secondary | ICD-10-CM | POA: Diagnosis not present

## 2017-03-29 DIAGNOSIS — Z7984 Long term (current) use of oral hypoglycemic drugs: Secondary | ICD-10-CM | POA: Insufficient documentation

## 2017-03-29 DIAGNOSIS — R591 Generalized enlarged lymph nodes: Secondary | ICD-10-CM | POA: Diagnosis not present

## 2017-03-29 DIAGNOSIS — R918 Other nonspecific abnormal finding of lung field: Secondary | ICD-10-CM | POA: Diagnosis not present

## 2017-03-29 DIAGNOSIS — I2510 Atherosclerotic heart disease of native coronary artery without angina pectoris: Secondary | ICD-10-CM | POA: Diagnosis not present

## 2017-03-29 DIAGNOSIS — Z8673 Personal history of transient ischemic attack (TIA), and cerebral infarction without residual deficits: Secondary | ICD-10-CM | POA: Insufficient documentation

## 2017-03-29 DIAGNOSIS — E785 Hyperlipidemia, unspecified: Secondary | ICD-10-CM | POA: Insufficient documentation

## 2017-03-29 LAB — CBC WITH DIFFERENTIAL/PLATELET
BASOS ABS: 0 10*3/uL (ref 0–0.1)
BASOS PCT: 1 %
EOS ABS: 0.3 10*3/uL (ref 0–0.7)
Eosinophils Relative: 7 %
HCT: 37.9 % — ABNORMAL LOW (ref 40.0–52.0)
Hemoglobin: 12.8 g/dL — ABNORMAL LOW (ref 13.0–18.0)
Lymphocytes Relative: 30 %
Lymphs Abs: 1.3 10*3/uL (ref 1.0–3.6)
MCH: 28.6 pg (ref 26.0–34.0)
MCHC: 33.9 g/dL (ref 32.0–36.0)
MCV: 84.4 fL (ref 80.0–100.0)
MONO ABS: 1 10*3/uL (ref 0.2–1.0)
MONOS PCT: 23 %
Neutro Abs: 1.7 10*3/uL (ref 1.4–6.5)
Neutrophils Relative %: 39 %
PLATELETS: 292 10*3/uL (ref 150–440)
RBC: 4.48 MIL/uL (ref 4.40–5.90)
RDW: 16.5 % — AB (ref 11.5–14.5)
WBC: 4.4 10*3/uL (ref 3.8–10.6)

## 2017-03-29 LAB — COMPREHENSIVE METABOLIC PANEL
ALBUMIN: 3.3 g/dL — AB (ref 3.5–5.0)
ALT: 55 U/L (ref 17–63)
AST: 27 U/L (ref 15–41)
Alkaline Phosphatase: 98 U/L (ref 38–126)
Anion gap: 7 (ref 5–15)
BUN: 17 mg/dL (ref 6–20)
CHLORIDE: 107 mmol/L (ref 101–111)
CO2: 19 mmol/L — AB (ref 22–32)
Calcium: 8.8 mg/dL — ABNORMAL LOW (ref 8.9–10.3)
Creatinine, Ser: 1.53 mg/dL — ABNORMAL HIGH (ref 0.61–1.24)
GFR calc Af Amer: 54 mL/min — ABNORMAL LOW (ref >60)
GFR calc non Af Amer: 47 mL/min — ABNORMAL LOW (ref >60)
GLUCOSE: 209 mg/dL — AB (ref 65–99)
POTASSIUM: 4.5 mmol/L (ref 3.5–5.1)
SODIUM: 133 mmol/L — AB (ref 135–145)
TOTAL PROTEIN: 6.5 g/dL (ref 6.5–8.1)
Total Bilirubin: 0.5 mg/dL (ref 0.3–1.2)

## 2017-03-29 NOTE — Assessment & Plan Note (Addendum)
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 Currently on Crizotinib 250 mg BID [since Nov]; CT scan 03/18/2017 shows STABLE LUL mass [~4-5cm]; ~98m pre-trach LN/STABLE.   # Continue Crizotinib- for now [facility] Patient tolerating well except for mild renal insufficiency. Given the previous atelectasis associated with the lung mass/at presentation; and stable/ no significant response from  crizotinib- discussed with the patient the role of radiation. He is interested. We'll refer to Dr. CDonella Stade   # CKD- multifactorial [prostatitic obstruction/diabetes]-  Creat ~1.5/ monitor for now.   # Bil LE swelling CKD/ Crizotinib-STABLE. Continue stockings for now.   # Bilateral PE left lower extremity DVT- on xarelto. Continue the same. Discussed the alternatives of Coumadin. Patient interested in continuing xAda   # refer to Radiation oncology for possible RT; follow up labs/MD in 3 weeks.  # I reviewed the blood work- with the patient in detail; also reviewed the imaging independently [as summarized above]; and with the patient in detail.

## 2017-03-29 NOTE — Progress Notes (Signed)
Patient is here today for a follow up. Patient has no new concerns today.  

## 2017-03-29 NOTE — Progress Notes (Signed)
[facility] NOTE  Patient Care Team: Roselee Nova, MD as PCP - General (Family Medicine)  CHIEF COMPLAINTS/PURPOSE OF CONSULTATION:  Lung cancer  #  Oncology History   # OCT 2017- ADENO CA LUNG; STAGE IV [; LUL; bil supraclavicular LN; Left neck LN Bx]; ROS-1 MUTATED; s/p Carbo-alimta x1[oct 2017]  # NOV 1st 2017- XALKORI 250 mg BID; JAN 15th CT- PR  # LLE DVT/bil PE/Multiple strokes [? On xarelto]-Lovenox; Jan mid 2018- xarelto [lovenox-insurance issues]  # MRI brain- multiple infarcts [2d echo/bubble study-NEG's/p Neurology eval]  # # s/p TURP [Sep 2017; Dr.Cope] DEC 26th CT- distended bladder   # MOLECULAR TESTING- ROS-1 POSITIVE; ALK/EGFR-NEG; PDL-1 EXPRESSION- 90%** [HIGH]     Primary cancer of left upper lobe of lung ([place])   06/12/2016 Initial Diagnosis    Primary cancer of left upper lobe of lung ([place])       HISTORY OF PRESENTING ILLNESS:  Melvin Willis 66 y.o.  male with newly diagnosedMetastatic lung cancer ROS- positive; Multiple strokes secondary to his metastatic lung cancer- Crizotinib  And xarelto is here for a follow up/ review the results of this restaging CAT scan.  Patient denies any worsening shortness of breath or cough. Denies any worsening swelling in the legs. Patient denies any new lumps or bumps. Appetite is good. He is urinating well.  Otherwise no vomiting no diarrhea no constipation.   ROS: A complete 10 point review of system is done which is negative except mentioned above in history of present illness  MEDICAL HISTORY:  Past Medical History:  Diagnosis Date  . Abnormal prostate specific antigen 08/08/2012  . Adiposity 04/16/2015  . Chronic kidney disease (CKD), stage III (moderate) 11/27/2016  . CVA (cerebral vascular accident) ([place]) 06/17/2016  . Diabetes mellitus without complication ([place])   . Diverticulosis of sigmoid colon 04/16/2015  . Dyslipidemia 03/18/2015  . Hemorrhoids, internal 04/16/2015  .  Hypercholesteremia 04/16/2015  . Hyperlipidemia   . Hypertension   . Primary cancer of left upper lobe of lung ([place])   . Pulmonary embolism ([place])   . Wears dentures    partial upper    SURGICAL HISTORY: Past Surgical History:  Procedure Laterality Date  . COLONOSCOPY    . COLONOSCOPY WITH PROPOFOL N/A 05/06/2015   Procedure: COLONOSCOPY WITH PROPOFOL;  Surgeon: Lucilla Lame, MD;  Location: [place];  Service: Endoscopy;  Laterality: N/A;  ASCENDING COLON POLYPS X 2 TERMINAL ILEUM BIOPSY RANDOM COLON BX. TRANSVERSE COLON POLYP SIGMOID COLON POLYP  . ESOPHAGOGASTRODUODENOSCOPY (EGD) WITH PROPOFOL N/A 05/06/2015   Procedure: ESOPHAGOGASTRODUODENOSCOPY (EGD) WITH PROPOFOL;  Surgeon: Lucilla Lame, MD;  Location: [place];  Service: Endoscopy;  Laterality: N/A;  GASTRIC BIOPSY X1    SOCIAL HISTORY: Social History   Social History  . Marital status: Married    Spouse name: N/A  . Number of children: N/A  . Years of education: N/A   Occupational History  . Not on file.   Social History Main Topics  . Smoking status: Never Smoker  . Smokeless tobacco: Never Used  . Alcohol use No  . Drug use: No  . Sexual activity: Yes    Partners: Female   Other Topics Concern  . Not on file   Social History Narrative  . No narrative on file    FAMILY HISTORY: Family History  Problem Relation Age of Onset  . Diabetes Mother   . Diabetes Father   . CAD Father   .  Dementia Father   . Diabetes Sister   . Cancer Maternal Uncle        Prostate  . Cancer Cousin        prostate    ALLERGIES:  has No Known Allergies.  MEDICATIONS:  Current Outpatient Prescriptions  Medication Sig Dispense Refill  . atorvastatin (LIPITOR) 40 MG tablet Take 1 tablet (40 mg total) by mouth daily at 6 PM. 90 tablet 0  . crizotinib (XALKORI) 250 MG capsule Take 1 capsule (250 mg total) by mouth 2 (two) times daily. 60 capsule 4  . finasteride (PROSCAR) 5 MG tablet Take 1 tablet (5 mg  total) by mouth at bedtime. 14 tablet 0  . metFORMIN (GLUCOPHAGE) 500 MG tablet Take 1 tablet (500 mg total) by mouth daily with breakfast. 90 tablet 0  . ondansetron (ZOFRAN) 8 MG tablet Take 1 tablet (8 mg total) by mouth every 8 (eight) hours as needed for nausea or vomiting (start 3 days; after chemo). 40 tablet 1  . prochlorperazine (COMPAZINE) 10 MG tablet Take 1 tablet (10 mg total) by mouth every 6 (six) hours as needed for nausea or vomiting. 40 tablet 1  . rivaroxaban (XARELTO) 20 MG TABS tablet Take 1 tablet (20 mg total) by mouth daily with supper. 90 tablet 4  . tamsulosin (FLOMAX) 0.4 MG CAPS capsule Take 0.4 mg by mouth every morning.     . sitaGLIPtin (JANUVIA) 50 MG tablet Take 1 tablet (50 mg total) by mouth daily. (Patient not taking: Reported on 02/23/2017) 90 tablet 0   No current facility-administered medications for this visit.       Marland Kitchen  PHYSICAL EXAMINATION: ECOG PERFORMANCE STATUS: 2 - Symptomatic, <50% confined to bed  Vitals:   03/29/17 1054  BP: 131/73  Pulse: (!) 47  Resp: 16  Temp: (!) 96.5 F (35.8 C)   Filed Weights   03/29/17 1054  Weight: 169 lb (76.7 kg)    GENERAL: Well-nourished well-developed; Alert, no distress and comfortable.   With his wife.  EYES: no pallor or icterus OROPHARYNX: no thrush or ulceration; good dentition  NECK: supple, no masses felt LYMPH:  no palpable lymphadenopathy in the cervical, axillary or inguinal regions LUNGS: clear to auscultation and  No wheeze or crackles HEART/CVS: regular rate & rhythm and no murmurs; No lower extremity edema ABDOMEN: abdomen soft, non-tender and normal bowel sounds Musculoskeletal:no cyanosis of digits and no clubbing  PSYCH: alert & oriented x 3 with fluent speech NEURO: no focal motor/sensory deficits SKIN:  no rashes or significant lesions  LABORATORY DATA:  I have reviewed the data as listed Lab Results  Component Value Date   WBC 4.4 03/29/2017   HGB 12.8 (L) 03/29/2017    HCT 37.9 (L) 03/29/2017   MCV 84.4 03/29/2017   PLT 292 03/29/2017    Recent Labs  02/02/17 0839 02/23/17 0830 03/29/17 1020  NA 134* 137 133*  K 4.2 4.4 4.5  CL 107 110 107  CO2 20* 21* 19*  GLUCOSE 160* 159* 209*  BUN 16 21* 17  CREATININE 1.29* 1.62* 1.53*  CALCIUM 8.8* 8.6* 8.8*  GFRNONAA 57* 44* 47*  GFRAA >60 51* 54*  PROT 7.1 6.8 6.5  ALBUMIN 3.4* 3.7 3.3*  AST 23 32 27  ALT 42 64* 55  ALKPHOS 99 94 98  BILITOT 0.6 0.5 0.5    RADIOGRAPHIC STUDIES: I have personally reviewed the radiological images as listed and agreed with the findings in the report. Ct Chest Wo Contrast  Result Date: 03/22/2017 CLINICAL DATA:  Restaging left upper lobe lung cancer, status post chemotherapy EXAM: CT CHEST WITHOUT CONTRAST TECHNIQUE: Multidetector CT imaging of the chest was performed following the standard protocol without IV contrast. COMPARISON:  12/23/2016 FINDINGS: Cardiovascular: Heart is normal in size.  No pericardial effusion. Coronary atherosclerosis in the LAD and left circumflex. No evidence of thoracic aortic aneurysm. Mediastinum/Nodes: Prevascular lymphadenopathy measuring up to 14 mm short axis (series 2/ image 46), unchanged. Otherwise, no suspicious mediastinal or axillary lymphadenopathy. Visualized thyroid is mildly heterogeneous. Lungs/Pleura: 4.3 x 2.5 cm mass-like opacity in the medial left upper lobe (series 3/ image 41), corresponding to known primary bronchogenic neoplasm, previously 4.3 x 2.8 cm, grossly unchanged. Associated scarring/volume loss in the left upper lobe. Stable 4 mm right lower lobe pulmonary nodule (series 3/ image 69). No focal consolidation. No pleural effusion or pneumothorax. Upper Abdomen: Visualized upper abdomen is notable for hepatic cysts measuring up to 2.7 cm. Musculoskeletal: Visualized osseous structures are within normal limits. IMPRESSION: 4.3 x 2.5 cm mass-like opacity in the medial left upper lobe, corresponding to the patient's known  primary bronchogenic neoplasm grossly unchanged. Associated prevascular lymphadenopathy measuring up to 14 mm short axis, unchanged. Electronically Signed   By: Julian Hy M.D.   On: 03/22/2017 12:55   IMPRESSION: 4.3 x 2.5 cm mass-like opacity in the medial left upper lobe, corresponding to the patient's known primary bronchogenic neoplasm grossly unchanged.  Associated prevascular lymphadenopathy measuring up to 14 mm short axis, unchanged.   Electronically Signed   By: Julian Hy M.D.   On: 03/22/2017 12:55  ASSESSMENT & PLAN:   Primary cancer of left upper lobe of lung ([place]) # Adenocarcinoma of the lung metastatic/stage IV- bilateral supraclavicular adenopathy; ROS-1 POSITIVE. Currently on Crizotinib 250 mg BID [since Nov]; CT scan 03/18/2017 shows STABLE LUL mass [~4-5cm]; ~27m pre-trach LN/STABLE.   # Continue Crizotinib- for now [[facility] Patient tolerating well except for mild renal insufficiency. Given the previous atelectasis associated with the lung mass/at presentation; and stable/ no significant response from  crizotinib- discussed with the patient the role of radiation. He is interested. We'll refer to Dr. CDonella Stade   # CKD- multifactorial [prostatitic obstruction/diabetes]-  Creat ~1.5/ monitor for now.   # Bil LE swelling CKD/ Crizotinib-STABLE. Continue stockings for now.   # Bilateral PE left lower extremity DVT- on xarelto. Continue the same. Discussed the alternatives of Coumadin. Patient interested in continuing xLagro   # refer to Radiation oncology for possible RT; follow up labs/MD in 3 weeks.  # I reviewed the blood work- with the patient in detail; also reviewed the imaging independently [as summarized above]; and with the patient in detail.       GCammie Sickle MD 03/29/2017 12:59 PM

## 2017-03-29 NOTE — Telephone Encounter (Signed)
Per Barnabas Lister, Education officer, museum. He contacted [employer] this morning to determine status of why pt ws denied. Pt previously denied due to pt's household income per [employer]. Barnabas Lister has also attempted to appeal process for denial. Pt should be contacted w/in 48 hrs with a decision from [employer]

## 2017-03-31 ENCOUNTER — Other Ambulatory Visit: Payer: Self-pay

## 2017-03-31 ENCOUNTER — Ambulatory Visit: Payer: Self-pay | Admitting: Internal Medicine

## 2017-04-01 ENCOUNTER — Ambulatory Visit
Admission: RE | Admit: 2017-04-01 | Discharge: 2017-04-01 | Disposition: A | Discharge status: Home / Self Care | Payer: PRIVATE HEALTH INSURANCE | Source: Ambulatory Visit | Attending: Radiation Oncology | Admitting: Radiation Oncology

## 2017-04-01 ENCOUNTER — Encounter: Payer: Self-pay | Admitting: Radiation Oncology

## 2017-04-01 ENCOUNTER — Ambulatory Visit (INDEPENDENT_AMBULATORY_CARE_PROVIDER_SITE_OTHER): Payer: PRIVATE HEALTH INSURANCE | Admitting: Family Medicine

## 2017-04-01 ENCOUNTER — Encounter: Payer: Self-pay | Admitting: Family Medicine

## 2017-04-01 ENCOUNTER — Other Ambulatory Visit: Payer: Self-pay | Admitting: *Deleted

## 2017-04-01 VITALS — BP 134/71 | HR 66 | Temp 98.1°F | Resp 16 | Ht 64.0 in | Wt 167.0 lb

## 2017-04-01 VITALS — BP 148/78 | HR 66 | Temp 97.4°F | Resp 20 | Wt 168.8 lb

## 2017-04-01 DIAGNOSIS — E119 Type 2 diabetes mellitus without complications: Secondary | ICD-10-CM

## 2017-04-01 DIAGNOSIS — Z7984 Long term (current) use of oral hypoglycemic drugs: Secondary | ICD-10-CM | POA: Insufficient documentation

## 2017-04-01 DIAGNOSIS — Z7901 Long term (current) use of anticoagulants: Secondary | ICD-10-CM | POA: Insufficient documentation

## 2017-04-01 DIAGNOSIS — Z8673 Personal history of transient ischemic attack (TIA), and cerebral infarction without residual deficits: Secondary | ICD-10-CM | POA: Insufficient documentation

## 2017-04-01 DIAGNOSIS — Z79899 Other long term (current) drug therapy: Secondary | ICD-10-CM | POA: Diagnosis not present

## 2017-04-01 DIAGNOSIS — N183 Chronic kidney disease, stage 3 (moderate): Secondary | ICD-10-CM | POA: Insufficient documentation

## 2017-04-01 DIAGNOSIS — C3412 Malignant neoplasm of upper lobe, left bronchus or lung: Secondary | ICD-10-CM

## 2017-04-01 DIAGNOSIS — E785 Hyperlipidemia, unspecified: Secondary | ICD-10-CM | POA: Diagnosis not present

## 2017-04-01 DIAGNOSIS — E7800 Pure hypercholesterolemia, unspecified: Secondary | ICD-10-CM

## 2017-04-01 DIAGNOSIS — I129 Hypertensive chronic kidney disease with stage 1 through stage 4 chronic kidney disease, or unspecified chronic kidney disease: Secondary | ICD-10-CM | POA: Insufficient documentation

## 2017-04-01 DIAGNOSIS — C778 Secondary and unspecified malignant neoplasm of lymph nodes of multiple regions: Secondary | ICD-10-CM | POA: Insufficient documentation

## 2017-04-01 DIAGNOSIS — Z510 Encounter for antineoplastic radiation therapy: Secondary | ICD-10-CM | POA: Insufficient documentation

## 2017-04-01 DIAGNOSIS — Z86718 Personal history of other venous thrombosis and embolism: Secondary | ICD-10-CM | POA: Diagnosis not present

## 2017-04-01 DIAGNOSIS — Z8719 Personal history of other diseases of the digestive system: Secondary | ICD-10-CM | POA: Insufficient documentation

## 2017-04-01 DIAGNOSIS — Z809 Family history of malignant neoplasm, unspecified: Secondary | ICD-10-CM | POA: Diagnosis not present

## 2017-04-01 LAB — POCT GLYCOSYLATED HEMOGLOBIN (HGB A1C): HEMOGLOBIN A1C: 7.6

## 2017-04-01 MED ORDER — METFORMIN HCL 500 MG PO TABS: 500.0000 mg | ORAL_TABLET | Freq: Two times a day (BID) | ORAL | 0 refills | Status: DC

## 2017-04-01 NOTE — Consult Note (Signed)
NEW PATIENT EVALUATION  Name: Melvin Willis  MRN: [id number]  Date:   04/01/2017     DOB: 21-Feb-1951   This 66 y.o. male patient presents to the clinic for initial evaluation of palliative radiation therapy for stage IV adenocarcinoma of the lung.  REFERRING PHYSICIAN: Roselee Nova, MD  CHIEF COMPLAINT:  Chief Complaint  Patient presents with  . Lung Cancer    Pt is here for initial consultation of lung cancer.       DIAGNOSIS: The encounter diagnosis was Malignant neoplasm of upper lobe of left lung ([place]).   PREVIOUS INVESTIGATIONS:  PET CT ordered CT scans reviewed Pathology reviewed Clinical notes reviewed  HPI: Patient is a 66 year old male originally diagnosed in October 2007 with adenocarcinoma the  lung stage IV of the left upper lobe with bilateral supraclavicular lymph node involvement as well as left lymph node of the neck. Patient was treated initially with carboplatinum and Alimta. Went on to receive Hulda Humphrey . He has suffered multiple stent cerebrovascular accident secondary to metastatic lung cancer. He is currently onCrizotinib. He has recently had a CT scan showing no significant response and I was consult by medical oncology to discuss possible palliative radiation therapy to his chest. He is currently on Xarelto for bilateral PEs from the left lower extremity. He is seen today and is doing fairly well. Specifically denies cough hemoptysis or chest tightness. He's having no bony pain at this time.  PLANNED TREATMENT REGIMEN: Palliative radiation therapy to lung  PAST MEDICAL HISTORY:  has a past medical history of Abnormal prostate specific antigen (08/08/2012); Adiposity (04/16/2015); Chronic kidney disease (CKD), stage III (moderate) (11/27/2016); CVA (cerebral vascular accident) ([place]) (06/17/2016); Diabetes mellitus without complication ([place]); Diverticulosis of sigmoid colon (04/16/2015); Dyslipidemia (03/18/2015); Hemorrhoids, internal (04/16/2015);  Hypercholesteremia (04/16/2015); Hyperlipidemia; Hypertension; Primary cancer of left upper lobe of lung ([place]); Pulmonary embolism ([place]); and Wears dentures.    PAST SURGICAL HISTORY:  Past Surgical History:  Procedure Laterality Date  . COLONOSCOPY    . COLONOSCOPY WITH PROPOFOL N/A 05/06/2015   Procedure: COLONOSCOPY WITH PROPOFOL;  Surgeon: Lucilla Lame, MD;  Location: [place] [facility];  Service: Endoscopy;  Laterality: N/A;  ASCENDING COLON POLYPS X 2 TERMINAL ILEUM BIOPSY RANDOM COLON BX. TRANSVERSE COLON POLYP SIGMOID COLON POLYP  . ESOPHAGOGASTRODUODENOSCOPY (EGD) WITH PROPOFOL N/A 05/06/2015   Procedure: ESOPHAGOGASTRODUODENOSCOPY (EGD) WITH PROPOFOL;  Surgeon: Lucilla Lame, MD;  Location: [place];  Service: Endoscopy;  Laterality: N/A;  GASTRIC BIOPSY X1    FAMILY HISTORY: family history includes CAD in his father; Cancer in his cousin and maternal uncle; Dementia in his father; Diabetes in his father, mother, and sister.  SOCIAL HISTORY:  reports that he has never smoked. He has never used smokeless tobacco. He reports that he does not drink alcohol or use drugs.  ALLERGIES: Patient has no known allergies.  MEDICATIONS:  Current Outpatient Prescriptions  Medication Sig Dispense Refill  . atorvastatin (LIPITOR) 40 MG tablet Take 1 tablet (40 mg total) by mouth daily at 6 PM. 90 tablet 0  . crizotinib (XALKORI) 250 MG capsule Take 1 capsule (250 mg total) by mouth 2 (two) times daily. 60 capsule 4  . finasteride (PROSCAR) 5 MG tablet Take 1 tablet (5 mg total) by mouth at bedtime. 14 tablet 0  . metFORMIN (GLUCOPHAGE) 500 MG tablet Take 1 tablet (500 mg total) by mouth 2 (two) times daily with a meal. 180 tablet 0  . ondansetron (ZOFRAN) 8 MG tablet Take 1  tablet (8 mg total) by mouth every 8 (eight) hours as needed for nausea or vomiting (start 3 days; after chemo). 40 tablet 1  . prochlorperazine (COMPAZINE) 10 MG tablet Take 1 tablet (10 mg total) by mouth every 6  (six) hours as needed for nausea or vomiting. 40 tablet 1  . rivaroxaban (XARELTO) 20 MG TABS tablet Take 1 tablet (20 mg total) by mouth daily with supper. 90 tablet 4  . tamsulosin (FLOMAX) 0.4 MG CAPS capsule Take 0.4 mg by mouth every morning.      No current facility-administered medications for this encounter.     ECOG PERFORMANCE STATUS:  0 - Asymptomatic  REVIEW OF SYSTEMS:  Patient denies any weight loss, fatigue, weakness, fever, chills or night sweats. Patient denies any loss of vision, blurred vision. Patient denies any ringing  of the ears or hearing loss. No irregular heartbeat. Patient denies heart murmur or history of fainting. Patient denies any chest pain or pain radiating to her upper extremities. Patient denies any shortness of breath, difficulty breathing at night, cough or hemoptysis. Patient denies any swelling in the lower legs. Patient denies any nausea vomiting, vomiting of blood, or coffee ground material in the vomitus. Patient denies any stomach pain. Patient states has had normal bowel movements no significant constipation or diarrhea. Patient denies any dysuria, hematuria or significant nocturia. Patient denies any problems walking, swelling in the joints or loss of balance. Patient denies any skin changes, loss of hair or loss of weight. Patient denies any excessive worrying or anxiety or significant depression. Patient denies any problems with insomnia. Patient denies excessive thirst, polyuria, polydipsia. Patient denies any swollen glands, patient denies easy bruising or easy bleeding. Patient denies any recent infections, allergies or URI. Patient "s visual fields have not changed significantly in recent time.    PHYSICAL EXAM: BP (!) 148/78   Pulse 66   Temp (!) 97.4 F (36.3 C)   Resp 20   Wt 168 lb 12.2 oz (76.5 kg)   BMI 28.97 kg/m  Well-developed well-nourished patient in NAD. HEENT reveals PERLA, EOMI, discs not visualized.  Oral cavity is clear. No  oral mucosal lesions are identified. Neck is clear without evidence of cervical or supraclavicular adenopathy. Lungs are clear to A&P. Cardiac examination is essentially unremarkable with regular rate and rhythm without murmur rub or thrill. Abdomen is benign with no organomegaly or masses noted. Motor sensory and DTR levels are equal and symmetric in the upper and lower extremities. Cranial nerves II through XII are grossly intact. Proprioception is intact. No peripheral adenopathy or edema is identified. No motor or sensory levels are noted. Crude visual fields are within normal range.  LABORATORY DATA: Pathology reports reviewed    RADIOLOGY RESULTS: CT scans reviewed PET CT scan ordered   IMPRESSION: Stage IV adenocarcinoma the left upper lobe for palliative radiation therapy to left chest.  PLAN: At this time of ordered a PET CT scan to get a better delineation of active tumor in both his chest and supra clavicular fossa is. This would aid me in my management of my treatment fields. I've also set patient up and personally ordered CT simulation shortly after the PET CT scan is performed. Risks and benefits of radiation including possible dysphasia fatigue skin reaction alteration of blood counts loss of normal lung volume all were discussed in detail with the patient. He seems to comprehend my treatment plan well. I will review his PET CT scan when it is available and  again have set him up for CT similar relation shortly thereafter.There will be extra effort by both professional staff as well as technical staff to coordinate and manage concurrent chemoradiation and ensuing side effects during his treatments.   I would like to take this opportunity to thank you for allowing me to participate in the care of your patient.Armstead Peaks., MD

## 2017-04-01 NOTE — Progress Notes (Signed)
Name: Melvin Willis   MRN: [id number]    DOB: September 26, 1950   Date:04/01/2017       Progress Note  Subjective  Chief Complaint  Chief Complaint  Patient presents with  . Follow-up    1 mo  . Diabetes    Diabetes  He presents for his follow-up diabetic visit. He has type 2 diabetes mellitus. His disease course has been worsening. Hypoglycemia symptoms include dizziness, headaches and sweats. Pertinent negatives for diabetes include no fatigue, no foot paresthesias, no polydipsia and no polyuria. Pertinent negatives for diabetic complications include no heart disease or peripheral neuropathy. Risk factors for coronary artery disease include diabetes mellitus. Current diabetic treatment includes oral agent (monotherapy). He is following a generally healthy and diabetic diet. He rarely participates in exercise. He monitors blood glucose at home 1-2 x per day. His breakfast blood glucose range is generally 140-180 mg/dl. An ACE inhibitor/angiotensin II receptor blocker is not being taken.  Hyperlipidemia  This is a chronic problem. The problem is controlled. Recent lipid tests were reviewed and are normal. Pertinent negatives include no leg pain, myalgias or shortness of breath. Current antihyperlipidemic treatment includes statins. Risk factors for coronary artery disease include diabetes mellitus, dyslipidemia and male sex.      Past Medical History:  Diagnosis Date  . Abnormal prostate specific antigen 08/08/2012  . Adiposity 04/16/2015  . Chronic kidney disease (CKD), stage III (moderate) 11/27/2016  . CVA (cerebral vascular accident) ([place]) 06/17/2016  . Diabetes mellitus without complication ([place])   . Diverticulosis of sigmoid colon 04/16/2015  . Dyslipidemia 03/18/2015  . Hemorrhoids, internal 04/16/2015  . Hypercholesteremia 04/16/2015  . Hyperlipidemia   . Hypertension   . Primary cancer of left upper lobe of lung ([place])   . Pulmonary embolism ([place])   . Wears dentures    partial upper     Past Surgical History:  Procedure Laterality Date  . COLONOSCOPY    . COLONOSCOPY WITH PROPOFOL N/A 05/06/2015   Procedure: COLONOSCOPY WITH PROPOFOL;  Surgeon: Lucilla Lame, MD;  Location: [place];  Service: Endoscopy;  Laterality: N/A;  ASCENDING COLON POLYPS X 2 TERMINAL ILEUM BIOPSY RANDOM COLON BX. TRANSVERSE COLON POLYP SIGMOID COLON POLYP  . ESOPHAGOGASTRODUODENOSCOPY (EGD) WITH PROPOFOL N/A 05/06/2015   Procedure: ESOPHAGOGASTRODUODENOSCOPY (EGD) WITH PROPOFOL;  Surgeon: Lucilla Lame, MD;  Location: [place];  Service: Endoscopy;  Laterality: N/A;  GASTRIC BIOPSY X1    Family History  Problem Relation Age of Onset  . Diabetes Mother   . Diabetes Father   . CAD Father   . Dementia Father   . Diabetes Sister   . Cancer Maternal Uncle        Prostate  . Cancer Cousin        prostate    Social History   Social History  . Marital status: Married    Spouse name: N/A  . Number of children: N/A  . Years of education: N/A   Occupational History  . Not on file.   Social History Main Topics  . Smoking status: Never Smoker  . Smokeless tobacco: Never Used  . Alcohol use No  . Drug use: No  . Sexual activity: Yes    Partners: Female   Other Topics Concern  . Not on file   Social History Narrative  . No narrative on file     Current Outpatient Prescriptions:  .  atorvastatin (LIPITOR) 40 MG tablet, Take 1 tablet (40 mg total) by mouth daily at  6 PM., Disp: 90 tablet, Rfl: 0 .  crizotinib (XALKORI) 250 MG capsule, Take 1 capsule (250 mg total) by mouth 2 (two) times daily., Disp: 60 capsule, Rfl: 4 .  finasteride (PROSCAR) 5 MG tablet, Take 1 tablet (5 mg total) by mouth at bedtime., Disp: 14 tablet, Rfl: 0 .  metFORMIN (GLUCOPHAGE) 500 MG tablet, Take 1 tablet (500 mg total) by mouth daily with breakfast., Disp: 90 tablet, Rfl: 0 .  ondansetron (ZOFRAN) 8 MG tablet, Take 1 tablet (8 mg total) by mouth every 8 (eight) hours as needed for  nausea or vomiting (start 3 days; after chemo)., Disp: 40 tablet, Rfl: 1 .  prochlorperazine (COMPAZINE) 10 MG tablet, Take 1 tablet (10 mg total) by mouth every 6 (six) hours as needed for nausea or vomiting., Disp: 40 tablet, Rfl: 1 .  rivaroxaban (XARELTO) 20 MG TABS tablet, Take 1 tablet (20 mg total) by mouth daily with supper., Disp: 90 tablet, Rfl: 4 .  sitaGLIPtin (JANUVIA) 50 MG tablet, Take 1 tablet (50 mg total) by mouth daily., Disp: 90 tablet, Rfl: 0 .  tamsulosin (FLOMAX) 0.4 MG CAPS capsule, Take 0.4 mg by mouth every morning. , Disp: , Rfl:   No Known Allergies   Review of Systems  Constitutional: Negative for fatigue.  Respiratory: Negative for shortness of breath.   Musculoskeletal: Negative for myalgias.  Neurological: Positive for dizziness and headaches.  Endo/Heme/Allergies: Negative for polydipsia.      Objective  Vitals:   04/01/17 0927  BP: 134/71  Pulse: 66  Resp: 16  Temp: 98.1 F (36.7 C)  TempSrc: Oral  SpO2: 98%  Weight: 167 lb (75.8 kg)  Height: 5\' 4"  (1.626 m)    Physical Exam  Constitutional: He is oriented to person, place, and time and well-developed, well-nourished, and in no distress.  HENT:  Head: Normocephalic and atraumatic.  Cardiovascular: Normal rate, regular rhythm and normal heart sounds.   No murmur heard. Pulmonary/Chest: Effort normal and breath sounds normal. He has no wheezes.  Abdominal: Soft. Bowel sounds are normal. There is no tenderness.  Neurological: He is alert and oriented to person, place, and time.  Psychiatric: Mood, memory, affect and judgment normal.  Nursing note and vitals reviewed.      Assessment & Plan  1. Type 2 diabetes mellitus without complication, without long-term current use of insulin (HCC) A1c 7.6%, worse compared to 7.4% from April 2018, increase metformin to 500 mg twice a day. - POCT HgB A1C - metFORMIN (GLUCOPHAGE) 500 MG tablet; Take 1 tablet (500 mg total) by mouth 2 (two) times  daily with a meal.  Dispense: 180 tablet; Refill: 0  2. Hypercholesteremia FLP at goal  3. Primary cancer of left upper lobe of lung [facility]) reviewed notes from oncology, patient is considering receiving radiation treatment   Torsten Weniger [facility] 04/01/2017 9:51 AM

## 2017-04-02 ENCOUNTER — Telehealth: Payer: Self-pay | Admitting: *Deleted

## 2017-04-02 NOTE — Telephone Encounter (Signed)
Patient called and asked for Nira Conn to call him back.  Did not state what was needed.

## 2017-04-06 NOTE — Telephone Encounter (Signed)
Call returned  - patient stated [facility] will make a decision by next week regarding pt approval for xarelto. Pt now out of xarelto as of Sunday.  Arranged for patient to pick up rx samples of xarelto this afternoon.

## 2017-04-07 ENCOUNTER — Ambulatory Visit
Admission: RE | Admit: 2017-04-07 | Discharge: 2017-04-07 | Disposition: A | Discharge status: Home / Self Care | Payer: PRIVATE HEALTH INSURANCE | Source: Ambulatory Visit | Attending: Radiation Oncology | Admitting: Radiation Oncology

## 2017-04-07 DIAGNOSIS — C3412 Malignant neoplasm of upper lobe, left bronchus or lung: Secondary | ICD-10-CM | POA: Diagnosis present

## 2017-04-07 DIAGNOSIS — R590 Localized enlarged lymph nodes: Secondary | ICD-10-CM | POA: Diagnosis not present

## 2017-04-07 LAB — GLUCOSE, CAPILLARY: Glucose-Capillary: 118 mg/dL — ABNORMAL HIGH (ref 65–99)

## 2017-04-07 MED ORDER — FLUDEOXYGLUCOSE F - 18 (FDG) INJECTION
12.4800 | Freq: Once | INTRAVENOUS | Status: AC | PRN
  Administered 2017-04-07: 12.48 via INTRAVENOUS

## 2017-04-19 ENCOUNTER — Inpatient Hospital Stay: Payer: PRIVATE HEALTH INSURANCE | Attending: Internal Medicine

## 2017-04-19 ENCOUNTER — Inpatient Hospital Stay (HOSPITAL_BASED_OUTPATIENT_CLINIC_OR_DEPARTMENT_OTHER): Payer: PRIVATE HEALTH INSURANCE | Admitting: Internal Medicine

## 2017-04-19 ENCOUNTER — Ambulatory Visit
Admission: RE | Admit: 2017-04-19 | Discharge: 2017-04-19 | Disposition: A | Discharge status: Home / Self Care | Payer: PRIVATE HEALTH INSURANCE | Source: Ambulatory Visit | Attending: Radiation Oncology | Admitting: Radiation Oncology

## 2017-04-19 VITALS — BP 151/95 | HR 63 | Temp 97.6°F | Resp 20 | Ht 64.0 in | Wt 167.0 lb

## 2017-04-19 DIAGNOSIS — N183 Chronic kidney disease, stage 3 (moderate): Secondary | ICD-10-CM

## 2017-04-19 DIAGNOSIS — I2510 Atherosclerotic heart disease of native coronary artery without angina pectoris: Secondary | ICD-10-CM

## 2017-04-19 DIAGNOSIS — Z86718 Personal history of other venous thrombosis and embolism: Secondary | ICD-10-CM | POA: Insufficient documentation

## 2017-04-19 DIAGNOSIS — Z5111 Encounter for antineoplastic chemotherapy: Secondary | ICD-10-CM | POA: Diagnosis present

## 2017-04-19 DIAGNOSIS — Z8719 Personal history of other diseases of the digestive system: Secondary | ICD-10-CM

## 2017-04-19 DIAGNOSIS — C3412 Malignant neoplasm of upper lobe, left bronchus or lung: Secondary | ICD-10-CM

## 2017-04-19 DIAGNOSIS — E7800 Pure hypercholesterolemia, unspecified: Secondary | ICD-10-CM | POA: Insufficient documentation

## 2017-04-19 DIAGNOSIS — Z8673 Personal history of transient ischemic attack (TIA), and cerebral infarction without residual deficits: Secondary | ICD-10-CM | POA: Insufficient documentation

## 2017-04-19 DIAGNOSIS — Z510 Encounter for antineoplastic radiation therapy: Secondary | ICD-10-CM | POA: Diagnosis not present

## 2017-04-19 DIAGNOSIS — Z7984 Long term (current) use of oral hypoglycemic drugs: Secondary | ICD-10-CM | POA: Insufficient documentation

## 2017-04-19 DIAGNOSIS — Z79899 Other long term (current) drug therapy: Secondary | ICD-10-CM

## 2017-04-19 DIAGNOSIS — I129 Hypertensive chronic kidney disease with stage 1 through stage 4 chronic kidney disease, or unspecified chronic kidney disease: Secondary | ICD-10-CM | POA: Diagnosis not present

## 2017-04-19 DIAGNOSIS — Z8042 Family history of malignant neoplasm of prostate: Secondary | ICD-10-CM | POA: Diagnosis not present

## 2017-04-19 DIAGNOSIS — Z86711 Personal history of pulmonary embolism: Secondary | ICD-10-CM | POA: Diagnosis not present

## 2017-04-19 DIAGNOSIS — E119 Type 2 diabetes mellitus without complications: Secondary | ICD-10-CM | POA: Diagnosis not present

## 2017-04-19 DIAGNOSIS — Z7901 Long term (current) use of anticoagulants: Secondary | ICD-10-CM

## 2017-04-19 LAB — CBC WITH DIFFERENTIAL/PLATELET
BASOS PCT: 0 %
Basophils Absolute: 0 10*3/uL (ref 0–0.1)
EOS ABS: 0.2 10*3/uL (ref 0–0.7)
EOS PCT: 5 %
HCT: 39 % — ABNORMAL LOW (ref 40.0–52.0)
HEMOGLOBIN: 13.2 g/dL (ref 13.0–18.0)
LYMPHS ABS: 1.2 10*3/uL (ref 1.0–3.6)
Lymphocytes Relative: 26 %
MCH: 28.5 pg (ref 26.0–34.0)
MCHC: 33.7 g/dL (ref 32.0–36.0)
MCV: 84.5 fL (ref 80.0–100.0)
MONO ABS: 0.8 10*3/uL (ref 0.2–1.0)
MONOS PCT: 17 %
NEUTROS PCT: 52 %
Neutro Abs: 2.5 10*3/uL (ref 1.4–6.5)
PLATELETS: 295 10*3/uL (ref 150–440)
RBC: 4.62 MIL/uL (ref 4.40–5.90)
RDW: 16.2 % — AB (ref 11.5–14.5)
WBC: 4.7 10*3/uL (ref 3.8–10.6)

## 2017-04-19 LAB — COMPREHENSIVE METABOLIC PANEL
ALBUMIN: 3.5 g/dL (ref 3.5–5.0)
ALT: 56 U/L (ref 17–63)
ANION GAP: 7 (ref 5–15)
AST: 33 U/L (ref 15–41)
Alkaline Phosphatase: 96 U/L (ref 38–126)
BUN: 19 mg/dL (ref 6–20)
CHLORIDE: 104 mmol/L (ref 101–111)
CO2: 23 mmol/L (ref 22–32)
Calcium: 8.9 mg/dL (ref 8.9–10.3)
Creatinine, Ser: 1.56 mg/dL — ABNORMAL HIGH (ref 0.61–1.24)
GFR calc non Af Amer: 46 mL/min — ABNORMAL LOW (ref >60)
GFR, EST AFRICAN AMERICAN: 53 mL/min — AB (ref >60)
GLUCOSE: 192 mg/dL — AB (ref 65–99)
POTASSIUM: 4.7 mmol/L (ref 3.5–5.1)
SODIUM: 134 mmol/L — AB (ref 135–145)
Total Bilirubin: 0.7 mg/dL (ref 0.3–1.2)
Total Protein: 6.9 g/dL (ref 6.5–8.1)

## 2017-04-19 NOTE — Progress Notes (Signed)
Per patient request, follow-up with [facility].- spoke with [place].  Pt still has not been approved for funding for xarelto. Patient also stated that he had been receiving bills from [facility] $100/month which has been deducted from his checking account. This is $50 administrative fee per drug per month-this was in pt's application and a signed agreement by patient. I was informed that "this fee is an administrative fee to pay for prescription's hope advocacy on behalf of patient."  Pt worried that he was paying this amount out and yet he has not yet received any financial assistance. Pt is receiving duplicate fees as prescription as pt had asked for financial assistance for xalkori a few months back. Pt stated that he also called prescription hope to stop the application process on the [facility] and I have also personally discussed d/c the [facility] application process with [facility] hope (as documented on previous phone notes).  Unfortantely, per [facility]'s policy, a written letter or documentation must follow d/c any application process.  On behalf of the patient, a letter was written today and also signed by the patient to request his application process for xalkori to be stopped. This note was faxed per patient's request to [facility]. Pt also provided with the written copy of this letter.  Per [facility]- a decision on helping the patient will not be confirmed until this Friday, August 17'th. I was informed that the patient would be contacted upon approval of xarelto assistance.  Per v/o Dr. Rogue Bussing - provided patient with 2 weeks supply in drug sample for xarelto to get patient through until approval. Pt would like to speak to [facility], cancer center social worker regarding his financial barriers. I explained to pt that Barnabas Lister was presently out of the office. Pt would like to wait at this moment to d/c his application process for the xarelto, however, if patient is  declined for xarelto assistance, he stated that he may need another letter written on his behalf to stop further assistance for xarleto.  His copay for xarelto if out of pocket is $250 per 30 days supply. Pt states that if [facility] approves him then he would only have $50/out of pocket per month. This may be a cheaper option for him to stay with prescription hope given these circumstances.  Pt states that he is "skeptic of prescription hope and very concerned that they are taking my money and I haven't even gotten my medications"   Patient will keep our office informed of any updates in his care.

## 2017-04-19 NOTE — Assessment & Plan Note (Addendum)
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 Currently on Crizotinib 250 mg BID [since Nov 2017]; CT scan 03/18/2017 shows STABLE LUL mass [~4-5cm]; ~54m pre-trach LN/STABLE. AUG 1st 2018- increased uptake in the prevascular lymph nodes; no significant uptake left upper medial lobe lung lesion; no distant metastasis.  # I would recommend carbotaxol weekly along with radiation to the area of residual disease. Patient will hold Crizotinib- while on chemo.   Discussed the potential side effects including but not limited to-increasing fatigue, nausea vomiting, diarrhea, hair loss, sores in the mouth, increase risk of infection and also neuropathy.   # CKD- multifactorial [prostatitic obstruction/diabetes]-  Creat ~1.5/ monitor for now.   # Bil LE swelling CKD/ Crizotinib-STABLE. Continue stockings for now.   # Bilateral PE left lower extremity DVT- on xarelto. Continue the same; issues with insurance.  # follow up in 2 weeks/labs- carbo-taxol.

## 2017-04-19 NOTE — Progress Notes (Signed)
[place] NOTE  Patient Care Team: Roselee Nova, MD as PCP - General (Family Medicine)  CHIEF COMPLAINTS/PURPOSE OF CONSULTATION:  Lung cancer  #  Oncology History   # OCT 2017- ADENO CA LUNG; STAGE IV [; LUL; bil supraclavicular LN; Left neck LN Bx]; ROS-1 MUTATED; s/p Carbo-alimta x1[oct 2017]  # NOV 1st 2017- XALKORI 250 mg BID; JAN 15th CT- PR  # LLE DVT/bil PE/Multiple strokes [? On xarelto]-Lovenox; Jan mid 2018- xarelto [lovenox-insurance issues]  # MRI brain- multiple infarcts [2d echo/bubble study-NEG's/p Neurology eval]  # # s/p TURP [Sep 2017; Dr.Cope] DEC 26th CT- distended bladder   # MOLECULAR TESTING- ROS-1 POSITIVE; ALK/EGFR-NEG; PDL-1 EXPRESSION- 90%** [HIGH]     Primary cancer of left upper lobe of lung ([place])   06/12/2016 Initial Diagnosis    Primary cancer of left upper lobe of lung ([place])       HISTORY OF PRESENTING ILLNESS:  Melvin Willis 66 y.o.  male with newly diagnosedMetastatic lung cancer ROS- positive; Multiple strokes secondary to his metastatic lung cancer- Crizotinib  And xarelto is here for a follow up/ review the results of this restaging PET scan.  In the interim patient was evaluated by radiation oncology- for radiation to the left upper lobe lung mass/mediastinal lymph nodes.  Patient had difficulty getting his xarelto thru insurance.  He has been getting samples from our clinic. Patient denies any worsening shortness of breath or cough. Denies any worsening swelling in the legs. Patient denies any new lumps or bumps. Appetite is good. He is urinating well.  Otherwise no vomiting no diarrhea no constipation.   ROS: A complete 10 point review of system is done which is negative except mentioned above in history of present illness  MEDICAL HISTORY:  Past Medical History:  Diagnosis Date  . Abnormal prostate specific antigen 08/08/2012  . Adiposity 04/16/2015  . Chronic kidney disease (CKD), stage III  (moderate) 11/27/2016  . CVA (cerebral vascular accident) ([place]) 06/17/2016  . Diabetes mellitus without complication ([place])   . Diverticulosis of sigmoid colon 04/16/2015  . Dyslipidemia 03/18/2015  . Hemorrhoids, internal 04/16/2015  . Hypercholesteremia 04/16/2015  . Hyperlipidemia   . Hypertension   . Primary cancer of left upper lobe of lung ([place])   . Pulmonary embolism ([place])   . Wears dentures    partial upper    SURGICAL HISTORY: Past Surgical History:  Procedure Laterality Date  . COLONOSCOPY    . COLONOSCOPY WITH PROPOFOL N/A 05/06/2015   Procedure: COLONOSCOPY WITH PROPOFOL;  Surgeon: Lucilla Lame, MD;  Location: [place];  Service: Endoscopy;  Laterality: N/A;  ASCENDING COLON POLYPS X 2 TERMINAL ILEUM BIOPSY RANDOM COLON BX. TRANSVERSE COLON POLYP SIGMOID COLON POLYP  . ESOPHAGOGASTRODUODENOSCOPY (EGD) WITH PROPOFOL N/A 05/06/2015   Procedure: ESOPHAGOGASTRODUODENOSCOPY (EGD) WITH PROPOFOL;  Surgeon: Lucilla Lame, MD;  Location: [place];  Service: Endoscopy;  Laterality: N/A;  GASTRIC BIOPSY X1    SOCIAL HISTORY: Social History   Social History  . Marital status: Married    Spouse name: N/A  . Number of children: N/A  . Years of education: N/A   Occupational History  . Not on file.   Social History Main Topics  . Smoking status: Never Smoker  . Smokeless tobacco: Never Used  . Alcohol use No  . Drug use: No  . Sexual activity: Yes    Partners: Female   Other Topics Concern  . Not on file   Social  History Narrative  . No narrative on file    FAMILY HISTORY: Family History  Problem Relation Age of Onset  . Diabetes Mother   . Diabetes Father   . CAD Father   . Dementia Father   . Diabetes Sister   . Cancer Maternal Uncle        Prostate  . Cancer Cousin        prostate    ALLERGIES:  has No Known Allergies.  MEDICATIONS:  Current Outpatient Prescriptions  Medication Sig Dispense Refill  . atorvastatin (LIPITOR) 40 MG tablet  Take 1 tablet (40 mg total) by mouth daily at 6 PM. 90 tablet 0  . crizotinib (XALKORI) 250 MG capsule Take 1 capsule (250 mg total) by mouth 2 (two) times daily. 60 capsule 4  . finasteride (PROSCAR) 5 MG tablet Take 1 tablet (5 mg total) by mouth at bedtime. 14 tablet 0  . metFORMIN (GLUCOPHAGE) 500 MG tablet Take 1 tablet (500 mg total) by mouth 2 (two) times daily with a meal. 180 tablet 0  . ondansetron (ZOFRAN) 8 MG tablet Take 1 tablet (8 mg total) by mouth every 8 (eight) hours as needed for nausea or vomiting (start 3 days; after chemo). 40 tablet 1  . prochlorperazine (COMPAZINE) 10 MG tablet Take 1 tablet (10 mg total) by mouth every 6 (six) hours as needed for nausea or vomiting. 40 tablet 1  . rivaroxaban (XARELTO) 20 MG TABS tablet Take 1 tablet (20 mg total) by mouth daily with supper. 90 tablet 4  . tamsulosin (FLOMAX) 0.4 MG CAPS capsule Take 0.4 mg by mouth every morning.      No current facility-administered medications for this visit.       Marland Kitchen  PHYSICAL EXAMINATION: ECOG PERFORMANCE STATUS: 2 - Symptomatic, <50% confined to bed  Vitals:   04/19/17 1123  BP: (!) 151/95  Pulse: 63  Resp: 20  Temp: 97.6 F (36.4 C)   Filed Weights   04/19/17 1123  Weight: 167 lb (75.8 kg)    GENERAL: Well-nourished well-developed; Alert, no distress and comfortable.   With his wife.  EYES: no pallor or icterus OROPHARYNX: no thrush or ulceration; good dentition  NECK: supple, no masses felt LYMPH:  no palpable lymphadenopathy in the cervical, axillary or inguinal regions LUNGS: clear to auscultation and  No wheeze or crackles HEART/CVS: regular rate & rhythm and no murmurs; No lower extremity edema ABDOMEN: abdomen soft, non-tender and normal bowel sounds Musculoskeletal:no cyanosis of digits and no clubbing  PSYCH: alert & oriented x 3 with fluent speech NEURO: no focal motor/sensory deficits SKIN:  no rashes or significant lesions  LABORATORY DATA:  I have reviewed the  data as listed Lab Results  Component Value Date   WBC 4.7 04/19/2017   HGB 13.2 04/19/2017   HCT 39.0 (L) 04/19/2017   MCV 84.5 04/19/2017   PLT 295 04/19/2017    Recent Labs  02/23/17 0830 03/29/17 1020 04/19/17 1034  NA 137 133* 134*  K 4.4 4.5 4.7  CL 110 107 104  CO2 21* 19* 23  GLUCOSE 159* 209* 192*  BUN 21* 17 19  CREATININE 1.62* 1.53* 1.56*  CALCIUM 8.6* 8.8* 8.9  GFRNONAA 44* 47* 46*  GFRAA 51* 54* 53*  PROT 6.8 6.5 6.9  ALBUMIN 3.7 3.3* 3.5  AST 32 27 33  ALT 64* 55 56  ALKPHOS 94 98 96  BILITOT 0.5 0.5 0.7    RADIOGRAPHIC STUDIES: I have personally reviewed the radiological  images as listed and agreed with the findings in the report. Ct Chest Wo Contrast  Result Date: 03/22/2017 CLINICAL DATA:  Restaging left upper lobe lung cancer, status post chemotherapy EXAM: CT CHEST WITHOUT CONTRAST TECHNIQUE: Multidetector CT imaging of the chest was performed following the standard protocol without IV contrast. COMPARISON:  12/23/2016 FINDINGS: Cardiovascular: Heart is normal in size.  No pericardial effusion. Coronary atherosclerosis in the LAD and left circumflex. No evidence of thoracic aortic aneurysm. Mediastinum/Nodes: Prevascular lymphadenopathy measuring up to 14 mm short axis (series 2/ image 46), unchanged. Otherwise, no suspicious mediastinal or axillary lymphadenopathy. Visualized thyroid is mildly heterogeneous. Lungs/Pleura: 4.3 x 2.5 cm mass-like opacity in the medial left upper lobe (series 3/ image 41), corresponding to known primary bronchogenic neoplasm, previously 4.3 x 2.8 cm, grossly unchanged. Associated scarring/volume loss in the left upper lobe. Stable 4 mm right lower lobe pulmonary nodule (series 3/ image 69). No focal consolidation. No pleural effusion or pneumothorax. Upper Abdomen: Visualized upper abdomen is notable for hepatic cysts measuring up to 2.7 cm. Musculoskeletal: Visualized osseous structures are within normal limits. IMPRESSION: 4.3  x 2.5 cm mass-like opacity in the medial left upper lobe, corresponding to the patient's known primary bronchogenic neoplasm grossly unchanged. Associated prevascular lymphadenopathy measuring up to 14 mm short axis, unchanged. Electronically Signed   By: Julian Hy M.D.   On: 03/22/2017 12:55   Nm Pet Image Initial (pi) Skull Base To Thigh  Result Date: 04/07/2017 CLINICAL DATA:  Subsequent treatment strategy for lung carcinoma. EXAM: NUCLEAR MEDICINE PET SKULL BASE TO THIGH TECHNIQUE: 12.4 mCi F-18 FDG was injected intravenously. Full-ring PET imaging was performed from the skull base to thigh after the radiotracer. CT data was obtained and used for attenuation correction and anatomic localization. FASTING BLOOD GLUCOSE:  Value: 118 mg/dl COMPARISON:  CT 03/24/2017 FINDINGS: NECK No hypermetabolic lymph nodes in the neck. CHEST In the LEFT upper lobe perihilar consolidation does not have associated metabolic activity. However, the rounded prevascular lymph nodes do associated metabolic activity with SUV max equal 5.0. The these lymph nodes are stable in size compared to 09/21/2016 and 06/03/2016. No additional hypermetabolic mediastinal lymph nodes. No additional pulmonary nodules. ABDOMEN/PELVIS Low-density lesions within the liver without associated metabolic activity consistent with benign hepatic cysts. Adrenal glands are normal. No hypermetabolic abdominopelvic lymph nodes. Prostate is enlarged. SKELETON No focal hypermetabolic activity to suggest skeletal metastasis. IMPRESSION: 1. Metabolic activity associated with persistently enlarged prevascular lymph nodes is concerning for residual carcinoma. Size stability raise the possibility of inflammatory lymph nodes. 2. No significant activity associated with consolidated lung along the LEFT upper mediastinum consistent with treated tumor / lung. 3. No new or distant metastatic disease. Electronically Signed   By: Suzy Bouchard M.D.   On: 04/07/2017  15:03   IMPRESSION: 4.3 x 2.5 cm mass-like opacity in the medial left upper lobe, corresponding to the patient's known primary bronchogenic neoplasm grossly unchanged.  Associated prevascular lymphadenopathy measuring up to 14 mm short axis, unchanged.   Electronically Signed   By: Julian Hy M.D.   On: 03/22/2017 12:55  ASSESSMENT & PLAN:   Primary cancer of left upper lobe of lung ([place]) # Adenocarcinoma of the lung metastatic/stage IV- bilateral supraclavicular adenopathy; ROS-1 POSITIVE. Currently on Crizotinib 250 mg BID [since Nov 2017]; CT scan 03/18/2017 shows STABLE LUL mass [~4-5cm]; ~46m pre-trach LN/STABLE. AUG 1st 2018- increased uptake in the prevascular lymph nodes; no significant uptake left upper medial lobe lung lesion; no distant metastasis.  #  I would recommend carbotaxol weekly along with radiation to the area of residual disease. Patient will hold Crizotinib- while on chemo.   Discussed the potential side effects including but not limited to-increasing fatigue, nausea vomiting, diarrhea, hair loss, sores in the mouth, increase risk of infection and also neuropathy.   # CKD- multifactorial [prostatitic obstruction/diabetes]-  Creat ~1.5/ monitor for now.   # Bil LE swelling CKD/ Crizotinib-STABLE. Continue stockings for now.   # Bilateral PE left lower extremity DVT- on xarelto. Continue the same; issues with insurance.  # follow up in 2 weeks/labs- carbo-taxol.     Cammie Sickle, MD 04/19/2017 12:41 PM

## 2017-04-19 NOTE — Progress Notes (Signed)
DISCONTINUE OFF PATHWAY REGIMEN - Non-Small Cell Lung     A cycle is every 21 days:     Pemetrexed        Dose Mod: None     Carboplatin        Dose Mod: None  **Always confirm dose/schedule in your pharmacy ordering system**    REASON: Continuation Of Treatment PRIOR TREATMENT: [id number]: Carboplatin AUC=5 + Pemetrexed 500 mg/m2 q21 Days x 4 Cycles TREATMENT RESPONSE: Partial Response (PR)  START OFF PATHWAY REGIMEN - Non-Small Cell Lung   [id number]:Carboplatin AUC 2 + Paclitaxel 50 mg/m2 weekly + RT:   Administer weekly during RT:     Paclitaxel      Carboplatin   **Always confirm dose/schedule in your pharmacy ordering system**    Patient Characteristics: Stage IV and Local Recurrence AJCC T Category: T3 Current Disease Status: Local Recurrence AJCC N Category: N2 AJCC M Category: M1 AJCC 8 Stage Grouping: IV Intent of Therapy: Non-Curative / Palliative Intent, Discussed with Patient

## 2017-04-21 DIAGNOSIS — Z510 Encounter for antineoplastic radiation therapy: Secondary | ICD-10-CM | POA: Diagnosis not present

## 2017-04-27 NOTE — Progress Notes (Unsigned)
PSN appealed [employer] patient assistance foundation about their decision not to help patient with the drug, Xarelto.  Patient was, again, denied assistance.

## 2017-04-29 ENCOUNTER — Telehealth: Payer: Self-pay | Admitting: *Deleted

## 2017-04-29 NOTE — Telephone Encounter (Signed)
MD received fax from [employer] today stating that patient was approved for patient assistance for Xarelto for up to one year. Per fax, pt should be receiving a copay card in mail for his xarelto.  I called and left a vm for patient to inform him that I received this notification. I also wanted to follow-up with patient to ensure that he had enough tablets to last him through the weekend and to see if he has received this card yet.

## 2017-05-03 ENCOUNTER — Ambulatory Visit: Payer: PRIVATE HEALTH INSURANCE

## 2017-05-03 ENCOUNTER — Inpatient Hospital Stay: Payer: PRIVATE HEALTH INSURANCE

## 2017-05-03 ENCOUNTER — Inpatient Hospital Stay (HOSPITAL_BASED_OUTPATIENT_CLINIC_OR_DEPARTMENT_OTHER): Payer: PRIVATE HEALTH INSURANCE | Admitting: Internal Medicine

## 2017-05-03 VITALS — BP 143/81 | HR 60 | Temp 97.6°F | Ht 64.0 in | Wt 167.8 lb

## 2017-05-03 VITALS — BP 129/73 | HR 66 | Resp 20

## 2017-05-03 DIAGNOSIS — Z86718 Personal history of other venous thrombosis and embolism: Secondary | ICD-10-CM | POA: Diagnosis not present

## 2017-05-03 DIAGNOSIS — E7800 Pure hypercholesterolemia, unspecified: Secondary | ICD-10-CM

## 2017-05-03 DIAGNOSIS — E119 Type 2 diabetes mellitus without complications: Secondary | ICD-10-CM

## 2017-05-03 DIAGNOSIS — Z7984 Long term (current) use of oral hypoglycemic drugs: Secondary | ICD-10-CM

## 2017-05-03 DIAGNOSIS — I129 Hypertensive chronic kidney disease with stage 1 through stage 4 chronic kidney disease, or unspecified chronic kidney disease: Secondary | ICD-10-CM | POA: Diagnosis not present

## 2017-05-03 DIAGNOSIS — Z8673 Personal history of transient ischemic attack (TIA), and cerebral infarction without residual deficits: Secondary | ICD-10-CM | POA: Diagnosis not present

## 2017-05-03 DIAGNOSIS — C3412 Malignant neoplasm of upper lobe, left bronchus or lung: Secondary | ICD-10-CM | POA: Diagnosis not present

## 2017-05-03 DIAGNOSIS — Z7901 Long term (current) use of anticoagulants: Secondary | ICD-10-CM | POA: Diagnosis not present

## 2017-05-03 DIAGNOSIS — I2510 Atherosclerotic heart disease of native coronary artery without angina pectoris: Secondary | ICD-10-CM | POA: Diagnosis not present

## 2017-05-03 DIAGNOSIS — Z86711 Personal history of pulmonary embolism: Secondary | ICD-10-CM

## 2017-05-03 DIAGNOSIS — Z5111 Encounter for antineoplastic chemotherapy: Secondary | ICD-10-CM | POA: Diagnosis not present

## 2017-05-03 DIAGNOSIS — Z8719 Personal history of other diseases of the digestive system: Secondary | ICD-10-CM | POA: Diagnosis not present

## 2017-05-03 DIAGNOSIS — Z79899 Other long term (current) drug therapy: Secondary | ICD-10-CM

## 2017-05-03 DIAGNOSIS — N183 Chronic kidney disease, stage 3 (moderate): Secondary | ICD-10-CM

## 2017-05-03 DIAGNOSIS — Z8042 Family history of malignant neoplasm of prostate: Secondary | ICD-10-CM

## 2017-05-03 LAB — COMPREHENSIVE METABOLIC PANEL
ALBUMIN: 3.6 g/dL (ref 3.5–5.0)
ALK PHOS: 101 U/L (ref 38–126)
ALT: 55 U/L (ref 17–63)
AST: 33 U/L (ref 15–41)
Anion gap: 9 (ref 5–15)
BILIRUBIN TOTAL: 0.6 mg/dL (ref 0.3–1.2)
BUN: 15 mg/dL (ref 6–20)
CO2: 21 mmol/L — ABNORMAL LOW (ref 22–32)
Calcium: 8.7 mg/dL — ABNORMAL LOW (ref 8.9–10.3)
Chloride: 103 mmol/L (ref 101–111)
Creatinine, Ser: 1.39 mg/dL — ABNORMAL HIGH (ref 0.61–1.24)
GFR calc Af Amer: >60 mL/min (ref >60)
GFR calc non Af Amer: 52 mL/min — ABNORMAL LOW (ref >60)
GLUCOSE: 191 mg/dL — AB (ref 65–99)
POTASSIUM: 4.4 mmol/L (ref 3.5–5.1)
Sodium: 133 mmol/L — ABNORMAL LOW (ref 135–145)
TOTAL PROTEIN: 6.7 g/dL (ref 6.5–8.1)

## 2017-05-03 LAB — CBC WITH DIFFERENTIAL/PLATELET
BASOS PCT: 1 %
Basophils Absolute: 0 10*3/uL (ref 0–0.1)
EOS ABS: 0.3 10*3/uL (ref 0–0.7)
Eosinophils Relative: 5 %
HEMATOCRIT: 39.3 % — AB (ref 40.0–52.0)
Hemoglobin: 13.3 g/dL (ref 13.0–18.0)
Lymphocytes Relative: 25 %
Lymphs Abs: 1.3 10*3/uL (ref 1.0–3.6)
MCH: 28.4 pg (ref 26.0–34.0)
MCHC: 33.8 g/dL (ref 32.0–36.0)
MCV: 84.2 fL (ref 80.0–100.0)
MONO ABS: 0.8 10*3/uL (ref 0.2–1.0)
MONOS PCT: 15 %
Neutro Abs: 3 10*3/uL (ref 1.4–6.5)
Neutrophils Relative %: 54 %
Platelets: 302 10*3/uL (ref 150–440)
RBC: 4.67 MIL/uL (ref 4.40–5.90)
RDW: 15.7 % — AB (ref 11.5–14.5)
WBC: 5.4 10*3/uL (ref 3.8–10.6)

## 2017-05-03 MED ORDER — FAMOTIDINE IN NACL 20-0.9 MG/50ML-% IV SOLN
20.0000 mg | Freq: Once | INTRAVENOUS | Status: AC
  Administered 2017-05-03: 20 mg via INTRAVENOUS
  Filled 2017-05-03: qty 50

## 2017-05-03 MED ORDER — PACLITAXEL CHEMO INJECTION 300 MG/50ML
45.0000 mg/m2 | Freq: Once | INTRAVENOUS | Status: AC
  Administered 2017-05-03: 84 mg via INTRAVENOUS
  Filled 2017-05-03: qty 14

## 2017-05-03 MED ORDER — PALONOSETRON HCL INJECTION 0.25 MG/5ML
0.2500 mg | Freq: Once | INTRAVENOUS | Status: AC
  Administered 2017-05-03: 0.25 mg via INTRAVENOUS
  Filled 2017-05-03: qty 5

## 2017-05-03 MED ORDER — SODIUM CHLORIDE 0.9 % IV SOLN
20.0000 mg | Freq: Once | INTRAVENOUS | Status: AC
  Administered 2017-05-03: 20 mg via INTRAVENOUS
  Filled 2017-05-03: qty 2

## 2017-05-03 MED ORDER — DIPHENHYDRAMINE HCL 50 MG/ML IJ SOLN
50.0000 mg | Freq: Once | INTRAMUSCULAR | Status: AC
  Administered 2017-05-03: 50 mg via INTRAVENOUS
  Filled 2017-05-03: qty 1

## 2017-05-03 MED ORDER — SODIUM CHLORIDE 0.9 % IV SOLN
170.0000 mg | Freq: Once | INTRAVENOUS | Status: AC
  Administered 2017-05-03: 170 mg via INTRAVENOUS
  Filled 2017-05-03: qty 17

## 2017-05-03 MED ORDER — SODIUM CHLORIDE 0.9 % IV SOLN
Freq: Once | INTRAVENOUS | Status: AC
  Administered 2017-05-03: 11:00:00 via INTRAVENOUS
  Filled 2017-05-03: qty 1000

## 2017-05-03 NOTE — Progress Notes (Signed)
[place] NOTE  Patient Care Team: Roselee Nova, MD as PCP - General (Family Medicine)  CHIEF COMPLAINTS/PURPOSE OF CONSULTATION:  Lung cancer  #  Oncology History   # OCT 2017- ADENO CA LUNG; STAGE IV [; LUL; bil supraclavicular LN; Left neck LN Bx]; ROS-1 MUTATED; s/p Carbo-alimta x1[oct 2017]  # NOV 1st 2017- XALKORI 250 mg BID; JAN 15th CT- PR;  # AUG 27th Chemo-RT to persistent LUL/mediastinal LN  # LLE DVT/bil PE/Multiple strokes [? On xarelto]-Lovenox; Jan mid 2018- xarelto [lovenox-insurance issues]  # MRI brain- multiple infarcts [2d echo/bubble study-NEG's/p Neurology eval]  # # s/p TURP [Sep 2017; Dr.Cope] DEC 26th CT- distended bladder   # MOLECULAR TESTING- ROS-1 POSITIVE; ALK/EGFR-NEG; PDL-1 EXPRESSION- 90%** [HIGH]     Primary cancer of left upper lobe of lung ([place])     HISTORY OF PRESENTING ILLNESS:  Melvin Willis 66 y.o.  male with newly diagnosedMetastatic lung cancer ROS- positive- currently on crizotinib- noted to have Left upper lung mass/atelectasis.  Given the persistence of the left upper lung mass/mediastinal adenopathy- plan to start radiation soon on 8/29.  He denies any worsening difficulty breathing. Denies any worsening cough. He continues to have mild swelling in the legs. He denies any new lumps or bumps. Denies any difficulty urination. No diarrhea or constipation. Denies any headaches or vision changes.  ROS: A complete 10 point review of system is done which is negative except mentioned above in history of present illness  MEDICAL HISTORY:  Past Medical History:  Diagnosis Date  . Abnormal prostate specific antigen 08/08/2012  . Adiposity 04/16/2015  . Chronic kidney disease (CKD), stage III (moderate) 11/27/2016  . CVA (cerebral vascular accident) ([place]) 06/17/2016  . Diabetes mellitus without complication ([place])   . Diverticulosis of sigmoid colon 04/16/2015  . Dyslipidemia 03/18/2015  . Hemorrhoids,  internal 04/16/2015  . Hypercholesteremia 04/16/2015  . Hyperlipidemia   . Hypertension   . Primary cancer of left upper lobe of lung ([place])   . Pulmonary embolism ([place])   . Wears dentures    partial upper    SURGICAL HISTORY: Past Surgical History:  Procedure Laterality Date  . COLONOSCOPY    . COLONOSCOPY WITH PROPOFOL N/A 05/06/2015   Procedure: COLONOSCOPY WITH PROPOFOL;  Surgeon: Lucilla Lame, MD;  Location: [facility];  Service: Endoscopy;  Laterality: N/A;  ASCENDING COLON POLYPS X 2 TERMINAL ILEUM BIOPSY RANDOM COLON BX. TRANSVERSE COLON POLYP SIGMOID COLON POLYP  . ESOPHAGOGASTRODUODENOSCOPY (EGD) WITH PROPOFOL N/A 05/06/2015   Procedure: ESOPHAGOGASTRODUODENOSCOPY (EGD) WITH PROPOFOL;  Surgeon: Lucilla Lame, MD;  Location: [place];  Service: Endoscopy;  Laterality: N/A;  GASTRIC BIOPSY X1    SOCIAL HISTORY: Social History   Social History  . Marital status: Married    Spouse name: N/A  . Number of children: N/A  . Years of education: N/A   Occupational History  . Not on file.   Social History Main Topics  . Smoking status: Never Smoker  . Smokeless tobacco: Never Used  . Alcohol use No  . Drug use: No  . Sexual activity: Yes    Partners: Female   Other Topics Concern  . Not on file   Social History Narrative  . No narrative on file    FAMILY HISTORY: Family History  Problem Relation Age of Onset  . Diabetes Mother   . Diabetes Father   . CAD Father   . Dementia Father   . Diabetes Sister   .  Cancer Maternal Uncle        Prostate  . Cancer Cousin        prostate    ALLERGIES:  has No Known Allergies.  MEDICATIONS:  Current Outpatient Prescriptions  Medication Sig Dispense Refill  . atorvastatin (LIPITOR) 40 MG tablet Take 1 tablet (40 mg total) by mouth daily at 6 PM. 90 tablet 0  . crizotinib (XALKORI) 250 MG capsule Take 1 capsule (250 mg total) by mouth 2 (two) times daily. 60 capsule 4  . finasteride (PROSCAR) 5 MG  tablet Take 1 tablet (5 mg total) by mouth at bedtime. 14 tablet 0  . metFORMIN (GLUCOPHAGE) 500 MG tablet Take 1 tablet (500 mg total) by mouth 2 (two) times daily with a meal. 180 tablet 0  . rivaroxaban (XARELTO) 20 MG TABS tablet Take 1 tablet (20 mg total) by mouth daily with supper. 90 tablet 4  . tamsulosin (FLOMAX) 0.4 MG CAPS capsule Take 0.4 mg by mouth every morning.     . ondansetron (ZOFRAN) 8 MG tablet Take 1 tablet (8 mg total) by mouth every 8 (eight) hours as needed for nausea or vomiting (start 3 days; after chemo). (Patient not taking: Reported on 05/03/2017) 40 tablet 1  . prochlorperazine (COMPAZINE) 10 MG tablet Take 1 tablet (10 mg total) by mouth every 6 (six) hours as needed for nausea or vomiting. (Patient not taking: Reported on 05/03/2017) 40 tablet 1   No current facility-administered medications for this visit.    Facility-Administered Medications Ordered in Other Visits  Medication Dose Route Frequency Provider Last Rate Last Dose  . CARBOplatin (PARAPLATIN) 170 mg in sodium chloride 0.9 % 250 mL chemo infusion  170 mg Intravenous Once Charlaine Dalton R, MD          .  PHYSICAL EXAMINATION: ECOG PERFORMANCE STATUS: 2 - Symptomatic, <50% confined to bed  Vitals:   05/03/17 1009  BP: (!) 143/81  Pulse: 60  Temp: 97.6 F (36.4 C)   Filed Weights   05/03/17 1009  Weight: 167 lb 12.8 oz (76.1 kg)    GENERAL: Well-nourished well-developed; Alert, no distress and comfortable. HE is alone.  EYES: no pallor or icterus OROPHARYNX: no thrush or ulceration; good dentition  NECK: supple, no masses felt LYMPH:  no palpable lymphadenopathy in the cervical, axillary or inguinal regions LUNGS: clear to auscultation and  No wheeze or crackles HEART/CVS: regular rate & rhythm and no murmurs; No lower extremity edema ABDOMEN: abdomen soft, non-tender and normal bowel sounds Musculoskeletal:no cyanosis of digits and no clubbing  PSYCH: alert & oriented x 3 with  fluent speech NEURO: no focal motor/sensory deficits SKIN:  no rashes or significant lesions  LABORATORY DATA:  I have reviewed the data as listed Lab Results  Component Value Date   WBC 5.4 05/03/2017   HGB 13.3 05/03/2017   HCT 39.3 (L) 05/03/2017   MCV 84.2 05/03/2017   PLT 302 05/03/2017    Recent Labs  03/29/17 1020 04/19/17 1034 05/03/17 0943  NA 133* 134* 133*  K 4.5 4.7 4.4  CL 107 104 103  CO2 19* 23 21*  GLUCOSE 209* 192* 191*  BUN '17 19 15  ' CREATININE 1.53* 1.56* 1.39*  CALCIUM 8.8* 8.9 8.7*  GFRNONAA 47* 46* 52*  GFRAA 54* 53* >60  PROT 6.5 6.9 6.7  ALBUMIN 3.3* 3.5 3.6  AST 27 33 33  ALT 55 56 55  ALKPHOS 98 96 101  BILITOT 0.5 0.7 0.6    RADIOGRAPHIC  STUDIES: I have personally reviewed the radiological images as listed and agreed with the findings in the report. Nm Pet Image Initial (pi) Skull Base To Thigh  Result Date: 04/07/2017 CLINICAL DATA:  Subsequent treatment strategy for lung carcinoma. EXAM: NUCLEAR MEDICINE PET SKULL BASE TO THIGH TECHNIQUE: 12.4 mCi F-18 FDG was injected intravenously. Full-ring PET imaging was performed from the skull base to thigh after the radiotracer. CT data was obtained and used for attenuation correction and anatomic localization. FASTING BLOOD GLUCOSE:  Value: 118 mg/dl COMPARISON:  CT 03/24/2017 FINDINGS: NECK No hypermetabolic lymph nodes in the neck. CHEST In the LEFT upper lobe perihilar consolidation does not have associated metabolic activity. However, the rounded prevascular lymph nodes do associated metabolic activity with SUV max equal 5.0. The these lymph nodes are stable in size compared to 09/21/2016 and 06/03/2016. No additional hypermetabolic mediastinal lymph nodes. No additional pulmonary nodules. ABDOMEN/PELVIS Low-density lesions within the liver without associated metabolic activity consistent with benign hepatic cysts. Adrenal glands are normal. No hypermetabolic abdominopelvic lymph nodes. Prostate is  enlarged. SKELETON No focal hypermetabolic activity to suggest skeletal metastasis. IMPRESSION: 1. Metabolic activity associated with persistently enlarged prevascular lymph nodes is concerning for residual carcinoma. Size stability raise the possibility of inflammatory lymph nodes. 2. No significant activity associated with consolidated lung along the LEFT upper mediastinum consistent with treated tumor / lung. 3. No new or distant metastatic disease. Electronically Signed   By: Suzy Bouchard M.D.   On: 04/07/2017 15:03   IMPRESSION: 4.3 x 2.5 cm mass-like opacity in the medial left upper lobe, corresponding to the patient's known primary bronchogenic neoplasm grossly unchanged.  Associated prevascular lymphadenopathy measuring up to 14 mm short axis, unchanged.   Electronically Signed   By: Julian Hy M.D.   On: 03/22/2017 12:55  ASSESSMENT & PLAN:   Primary cancer of left upper lobe of lung ([place]) # Adenocarcinoma of the lung metastatic/stage IV- bilateral supraclavicular adenopathy; ROS-1 POSITIVE. Currently on Crizotinib 250 mg BID [since Nov 2017]; CT scan 03/18/2017 shows STABLE LUL mass [~4-5cm]; ~71m pre-trach LN/STABLE. AUG 1st 2018- increased uptake in the prevascular lymph nodes; no significant uptake left upper medial lobe lung lesion; no distant metastasis.  # START carbotaxol weekly along with radiation to the area of residual disease. Proceed with cycle #1 today. Labs today reviewed;  acceptable for treatment today.  Patient asked to hold Crizotinib- while on chemo. Has anti-emetics at home. Patient has radiation planning until September 27.  # CKD- multifactorial [prostatitic obstruction/diabetes]-  Creat ~1.39/ monitor for now.   # Bil LE swelling CKD/ Crizotinib-STABLE. Continue stockings for now.   # Bilateral PE left lower extremity DVT- on xarelto.  # follow up in 2 weeks/labs- carbo-taxol.; weekly carbo-taxol/labs [9/04].      GCammie Sickle  MD 05/03/2017 1:26 PM

## 2017-05-03 NOTE — Patient Instructions (Signed)
Please DO NOT TAKE ANY XALKORI (crizotinib) (YOUR CANCER MEDICATION)

## 2017-05-03 NOTE — Progress Notes (Signed)
Patient currently taking xalkori. Patient has not missed any dosing of the xalkori.

## 2017-05-03 NOTE — Assessment & Plan Note (Addendum)
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 Currently on Crizotinib 250 mg BID [since Nov 2017]; CT scan 03/18/2017 shows STABLE LUL mass [~4-5cm]; ~34m pre-trach LN/STABLE. AUG 1st 2018- increased uptake in the prevascular lymph nodes; no significant uptake left upper medial lobe lung lesion; no distant metastasis.  # START carbotaxol weekly along with radiation to the area of residual disease. Proceed with cycle #1 today. Labs today reviewed;  acceptable for treatment today.  Patient asked to hold Crizotinib- while on chemo. Has anti-emetics at home. Patient has radiation planning until September 27.  # CKD- multifactorial [prostatitic obstruction/diabetes]-  Creat ~1.39/ monitor for now.   # Bil LE swelling CKD/ Crizotinib-STABLE. Continue stockings for now.   # Bilateral PE left lower extremity DVT- on xarelto.  # follow up in 2 weeks/labs- carbo-taxol.; weekly carbo-taxol/labs [9/04].

## 2017-05-04 ENCOUNTER — Ambulatory Visit: Payer: PRIVATE HEALTH INSURANCE

## 2017-05-04 ENCOUNTER — Ambulatory Visit
Admission: RE | Admit: 2017-05-04 | Discharge: 2017-05-04 | Disposition: A | Discharge status: Home / Self Care | Payer: PRIVATE HEALTH INSURANCE | Source: Ambulatory Visit | Attending: Radiation Oncology | Admitting: Radiation Oncology

## 2017-05-04 DIAGNOSIS — Z510 Encounter for antineoplastic radiation therapy: Secondary | ICD-10-CM | POA: Diagnosis not present

## 2017-05-05 ENCOUNTER — Ambulatory Visit
Admission: RE | Admit: 2017-05-05 | Discharge: 2017-05-05 | Disposition: A | Discharge status: Home / Self Care | Payer: PRIVATE HEALTH INSURANCE | Source: Ambulatory Visit | Attending: Radiation Oncology | Admitting: Radiation Oncology

## 2017-05-05 DIAGNOSIS — Z510 Encounter for antineoplastic radiation therapy: Secondary | ICD-10-CM | POA: Diagnosis not present

## 2017-05-06 ENCOUNTER — Ambulatory Visit
Admission: RE | Admit: 2017-05-06 | Discharge: 2017-05-06 | Disposition: A | Discharge status: Home / Self Care | Payer: PRIVATE HEALTH INSURANCE | Source: Ambulatory Visit | Attending: Radiation Oncology | Admitting: Radiation Oncology

## 2017-05-06 DIAGNOSIS — Z510 Encounter for antineoplastic radiation therapy: Secondary | ICD-10-CM | POA: Diagnosis not present

## 2017-05-07 ENCOUNTER — Ambulatory Visit
Admission: RE | Admit: 2017-05-07 | Discharge: 2017-05-07 | Disposition: A | Discharge status: Home / Self Care | Payer: PRIVATE HEALTH INSURANCE | Source: Ambulatory Visit | Attending: Radiation Oncology | Admitting: Radiation Oncology

## 2017-05-07 ENCOUNTER — Other Ambulatory Visit: Payer: Self-pay | Admitting: *Deleted

## 2017-05-07 DIAGNOSIS — I2699 Other pulmonary embolism without acute cor pulmonale: Secondary | ICD-10-CM

## 2017-05-07 DIAGNOSIS — Z510 Encounter for antineoplastic radiation therapy: Secondary | ICD-10-CM | POA: Diagnosis not present

## 2017-05-07 DIAGNOSIS — C3412 Malignant neoplasm of upper lobe, left bronchus or lung: Secondary | ICD-10-CM

## 2017-05-07 MED ORDER — RIVAROXABAN 20 MG PO TABS: 20.0000 mg | ORAL_TABLET | Freq: Every day | ORAL | 11 refills | Status: DC

## 2017-05-11 ENCOUNTER — Ambulatory Visit
Admission: RE | Admit: 2017-05-11 | Discharge: 2017-05-11 | Disposition: A | Discharge status: Home / Self Care | Payer: PRIVATE HEALTH INSURANCE | Source: Ambulatory Visit | Attending: Radiation Oncology | Admitting: Radiation Oncology

## 2017-05-11 ENCOUNTER — Inpatient Hospital Stay: Payer: PRIVATE HEALTH INSURANCE | Attending: Internal Medicine

## 2017-05-11 ENCOUNTER — Inpatient Hospital Stay: Payer: PRIVATE HEALTH INSURANCE

## 2017-05-11 VITALS — BP 131/77 | HR 70 | Resp 20

## 2017-05-11 DIAGNOSIS — E7800 Pure hypercholesterolemia, unspecified: Secondary | ICD-10-CM | POA: Insufficient documentation

## 2017-05-11 DIAGNOSIS — Z86718 Personal history of other venous thrombosis and embolism: Secondary | ICD-10-CM | POA: Insufficient documentation

## 2017-05-11 DIAGNOSIS — Z86711 Personal history of pulmonary embolism: Secondary | ICD-10-CM | POA: Insufficient documentation

## 2017-05-11 DIAGNOSIS — Z5111 Encounter for antineoplastic chemotherapy: Secondary | ICD-10-CM | POA: Diagnosis present

## 2017-05-11 DIAGNOSIS — K648 Other hemorrhoids: Secondary | ICD-10-CM | POA: Insufficient documentation

## 2017-05-11 DIAGNOSIS — C3412 Malignant neoplasm of upper lobe, left bronchus or lung: Secondary | ICD-10-CM | POA: Insufficient documentation

## 2017-05-11 DIAGNOSIS — Z7984 Long term (current) use of oral hypoglycemic drugs: Secondary | ICD-10-CM | POA: Diagnosis not present

## 2017-05-11 DIAGNOSIS — N183 Chronic kidney disease, stage 3 (moderate): Secondary | ICD-10-CM | POA: Diagnosis not present

## 2017-05-11 DIAGNOSIS — Z8673 Personal history of transient ischemic attack (TIA), and cerebral infarction without residual deficits: Secondary | ICD-10-CM | POA: Diagnosis not present

## 2017-05-11 DIAGNOSIS — R197 Diarrhea, unspecified: Secondary | ICD-10-CM | POA: Insufficient documentation

## 2017-05-11 DIAGNOSIS — Z8042 Family history of malignant neoplasm of prostate: Secondary | ICD-10-CM | POA: Insufficient documentation

## 2017-05-11 DIAGNOSIS — I129 Hypertensive chronic kidney disease with stage 1 through stage 4 chronic kidney disease, or unspecified chronic kidney disease: Secondary | ICD-10-CM | POA: Diagnosis not present

## 2017-05-11 DIAGNOSIS — R599 Enlarged lymph nodes, unspecified: Secondary | ICD-10-CM | POA: Insufficient documentation

## 2017-05-11 DIAGNOSIS — Z79899 Other long term (current) drug therapy: Secondary | ICD-10-CM | POA: Diagnosis not present

## 2017-05-11 DIAGNOSIS — E785 Hyperlipidemia, unspecified: Secondary | ICD-10-CM | POA: Diagnosis not present

## 2017-05-11 DIAGNOSIS — Z8719 Personal history of other diseases of the digestive system: Secondary | ICD-10-CM | POA: Diagnosis not present

## 2017-05-11 DIAGNOSIS — C7800 Secondary malignant neoplasm of unspecified lung: Secondary | ICD-10-CM | POA: Diagnosis not present

## 2017-05-11 DIAGNOSIS — Z923 Personal history of irradiation: Secondary | ICD-10-CM | POA: Diagnosis not present

## 2017-05-11 DIAGNOSIS — E1165 Type 2 diabetes mellitus with hyperglycemia: Secondary | ICD-10-CM | POA: Diagnosis not present

## 2017-05-11 DIAGNOSIS — Z510 Encounter for antineoplastic radiation therapy: Secondary | ICD-10-CM | POA: Diagnosis not present

## 2017-05-11 DIAGNOSIS — Z7901 Long term (current) use of anticoagulants: Secondary | ICD-10-CM | POA: Insufficient documentation

## 2017-05-11 LAB — CBC WITH DIFFERENTIAL/PLATELET
BASOS PCT: 1 %
Basophils Absolute: 0 10*3/uL (ref 0–0.1)
Eosinophils Absolute: 0.1 10*3/uL (ref 0–0.7)
Eosinophils Relative: 4 %
HEMATOCRIT: 35.8 % — AB (ref 40.0–52.0)
HEMOGLOBIN: 12.4 g/dL — AB (ref 13.0–18.0)
LYMPHS ABS: 1.1 10*3/uL (ref 1.0–3.6)
LYMPHS PCT: 34 %
MCH: 29.2 pg (ref 26.0–34.0)
MCHC: 34.7 g/dL (ref 32.0–36.0)
MCV: 84.2 fL (ref 80.0–100.0)
MONOS PCT: 15 %
Monocytes Absolute: 0.5 10*3/uL (ref 0.2–1.0)
NEUTROS ABS: 1.6 10*3/uL (ref 1.4–6.5)
NEUTROS PCT: 46 %
Platelets: 301 10*3/uL (ref 150–440)
RBC: 4.25 MIL/uL — ABNORMAL LOW (ref 4.40–5.90)
RDW: 15.5 % — ABNORMAL HIGH (ref 11.5–14.5)
WBC: 3.3 10*3/uL — ABNORMAL LOW (ref 3.8–10.6)

## 2017-05-11 LAB — BASIC METABOLIC PANEL
Anion gap: 6 (ref 5–15)
BUN: 20 mg/dL (ref 6–20)
CO2: 22 mmol/L (ref 22–32)
CREATININE: 1.2 mg/dL (ref 0.61–1.24)
Calcium: 8.7 mg/dL — ABNORMAL LOW (ref 8.9–10.3)
Chloride: 106 mmol/L (ref 101–111)
Glucose, Bld: 169 mg/dL — ABNORMAL HIGH (ref 65–99)
Potassium: 4 mmol/L (ref 3.5–5.1)
Sodium: 134 mmol/L — ABNORMAL LOW (ref 135–145)

## 2017-05-11 MED ORDER — HEPARIN SOD (PORK) LOCK FLUSH 100 UNIT/ML IV SOLN: 500.0000 [IU] | Freq: Once | INTRAVENOUS | Status: DC | PRN

## 2017-05-11 MED ORDER — FAMOTIDINE IN NACL 20-0.9 MG/50ML-% IV SOLN
20.0000 mg | Freq: Once | INTRAVENOUS | Status: AC
  Administered 2017-05-11: 20 mg via INTRAVENOUS
  Filled 2017-05-11: qty 50

## 2017-05-11 MED ORDER — DIPHENHYDRAMINE HCL 50 MG/ML IJ SOLN
50.0000 mg | Freq: Once | INTRAMUSCULAR | Status: AC
  Administered 2017-05-11: 50 mg via INTRAVENOUS
  Filled 2017-05-11: qty 1

## 2017-05-11 MED ORDER — PALONOSETRON HCL INJECTION 0.25 MG/5ML
0.2500 mg | Freq: Once | INTRAVENOUS | Status: AC
  Administered 2017-05-11: 0.25 mg via INTRAVENOUS
  Filled 2017-05-11: qty 5

## 2017-05-11 MED ORDER — CARBOPLATIN CHEMO INJECTION 450 MG/45ML
185.2000 mg | Freq: Once | INTRAVENOUS | Status: AC
  Administered 2017-05-11: 190 mg via INTRAVENOUS
  Filled 2017-05-11: qty 19

## 2017-05-11 MED ORDER — SODIUM CHLORIDE 0.9 % IV SOLN
20.0000 mg | Freq: Once | INTRAVENOUS | Status: AC
  Administered 2017-05-11: 20 mg via INTRAVENOUS
  Filled 2017-05-11: qty 2

## 2017-05-11 MED ORDER — SODIUM CHLORIDE 0.9 % IV SOLN
Freq: Once | INTRAVENOUS | Status: AC
  Administered 2017-05-11: 10:00:00 via INTRAVENOUS
  Filled 2017-05-11: qty 1000

## 2017-05-11 MED ORDER — PACLITAXEL CHEMO INJECTION 300 MG/50ML
45.0000 mg/m2 | Freq: Once | INTRAVENOUS | Status: AC
  Administered 2017-05-11: 84 mg via INTRAVENOUS
  Filled 2017-05-11: qty 14

## 2017-05-12 ENCOUNTER — Ambulatory Visit
Admission: RE | Admit: 2017-05-12 | Discharge: 2017-05-12 | Disposition: A | Discharge status: Home / Self Care | Payer: PRIVATE HEALTH INSURANCE | Source: Ambulatory Visit | Attending: Radiation Oncology | Admitting: Radiation Oncology

## 2017-05-12 DIAGNOSIS — Z510 Encounter for antineoplastic radiation therapy: Secondary | ICD-10-CM | POA: Diagnosis not present

## 2017-05-13 ENCOUNTER — Ambulatory Visit
Admission: RE | Admit: 2017-05-13 | Discharge: 2017-05-13 | Disposition: A | Discharge status: Home / Self Care | Payer: PRIVATE HEALTH INSURANCE | Source: Ambulatory Visit | Attending: Radiation Oncology | Admitting: Radiation Oncology

## 2017-05-13 DIAGNOSIS — Z510 Encounter for antineoplastic radiation therapy: Secondary | ICD-10-CM | POA: Diagnosis not present

## 2017-05-14 ENCOUNTER — Ambulatory Visit
Admission: RE | Admit: 2017-05-14 | Discharge: 2017-05-14 | Disposition: A | Discharge status: Home / Self Care | Payer: PRIVATE HEALTH INSURANCE | Source: Ambulatory Visit | Attending: Radiation Oncology | Admitting: Radiation Oncology

## 2017-05-14 DIAGNOSIS — Z510 Encounter for antineoplastic radiation therapy: Secondary | ICD-10-CM | POA: Diagnosis not present

## 2017-05-17 ENCOUNTER — Inpatient Hospital Stay (HOSPITAL_BASED_OUTPATIENT_CLINIC_OR_DEPARTMENT_OTHER): Payer: PRIVATE HEALTH INSURANCE | Admitting: Internal Medicine

## 2017-05-17 ENCOUNTER — Inpatient Hospital Stay: Payer: PRIVATE HEALTH INSURANCE

## 2017-05-17 ENCOUNTER — Ambulatory Visit
Admission: RE | Admit: 2017-05-17 | Discharge: 2017-05-17 | Disposition: A | Discharge status: Home / Self Care | Payer: PRIVATE HEALTH INSURANCE | Source: Ambulatory Visit | Attending: Radiation Oncology | Admitting: Radiation Oncology

## 2017-05-17 VITALS — BP 141/96 | HR 82 | Temp 97.6°F | Resp 20 | Wt 168.0 lb

## 2017-05-17 DIAGNOSIS — R599 Enlarged lymph nodes, unspecified: Secondary | ICD-10-CM

## 2017-05-17 DIAGNOSIS — C7800 Secondary malignant neoplasm of unspecified lung: Secondary | ICD-10-CM

## 2017-05-17 DIAGNOSIS — Z923 Personal history of irradiation: Secondary | ICD-10-CM

## 2017-05-17 DIAGNOSIS — Z7984 Long term (current) use of oral hypoglycemic drugs: Secondary | ICD-10-CM | POA: Diagnosis not present

## 2017-05-17 DIAGNOSIS — K648 Other hemorrhoids: Secondary | ICD-10-CM

## 2017-05-17 DIAGNOSIS — E7800 Pure hypercholesterolemia, unspecified: Secondary | ICD-10-CM

## 2017-05-17 DIAGNOSIS — Z8673 Personal history of transient ischemic attack (TIA), and cerebral infarction without residual deficits: Secondary | ICD-10-CM

## 2017-05-17 DIAGNOSIS — Z86711 Personal history of pulmonary embolism: Secondary | ICD-10-CM | POA: Diagnosis not present

## 2017-05-17 DIAGNOSIS — Z8042 Family history of malignant neoplasm of prostate: Secondary | ICD-10-CM

## 2017-05-17 DIAGNOSIS — E1165 Type 2 diabetes mellitus with hyperglycemia: Secondary | ICD-10-CM

## 2017-05-17 DIAGNOSIS — C3412 Malignant neoplasm of upper lobe, left bronchus or lung: Secondary | ICD-10-CM

## 2017-05-17 DIAGNOSIS — N183 Chronic kidney disease, stage 3 (moderate): Secondary | ICD-10-CM

## 2017-05-17 DIAGNOSIS — I129 Hypertensive chronic kidney disease with stage 1 through stage 4 chronic kidney disease, or unspecified chronic kidney disease: Secondary | ICD-10-CM

## 2017-05-17 DIAGNOSIS — Z79899 Other long term (current) drug therapy: Secondary | ICD-10-CM

## 2017-05-17 DIAGNOSIS — Z7901 Long term (current) use of anticoagulants: Secondary | ICD-10-CM

## 2017-05-17 DIAGNOSIS — Z8719 Personal history of other diseases of the digestive system: Secondary | ICD-10-CM

## 2017-05-17 DIAGNOSIS — E785 Hyperlipidemia, unspecified: Secondary | ICD-10-CM

## 2017-05-17 DIAGNOSIS — Z86718 Personal history of other venous thrombosis and embolism: Secondary | ICD-10-CM

## 2017-05-17 DIAGNOSIS — Z5111 Encounter for antineoplastic chemotherapy: Secondary | ICD-10-CM | POA: Diagnosis not present

## 2017-05-17 DIAGNOSIS — Z510 Encounter for antineoplastic radiation therapy: Secondary | ICD-10-CM | POA: Diagnosis not present

## 2017-05-17 LAB — COMPREHENSIVE METABOLIC PANEL
ALT: 52 U/L (ref 17–63)
AST: 29 U/L (ref 15–41)
Albumin: 3.5 g/dL (ref 3.5–5.0)
Alkaline Phosphatase: 76 U/L (ref 38–126)
Anion gap: 5 (ref 5–15)
BUN: 14 mg/dL (ref 6–20)
CHLORIDE: 107 mmol/L (ref 101–111)
CO2: 23 mmol/L (ref 22–32)
Calcium: 9.1 mg/dL (ref 8.9–10.3)
Creatinine, Ser: 1.08 mg/dL (ref 0.61–1.24)
Glucose, Bld: 157 mg/dL — ABNORMAL HIGH (ref 65–99)
POTASSIUM: 4 mmol/L (ref 3.5–5.1)
SODIUM: 135 mmol/L (ref 135–145)
Total Bilirubin: 1.2 mg/dL (ref 0.3–1.2)
Total Protein: 6.6 g/dL (ref 6.5–8.1)

## 2017-05-17 LAB — CBC WITH DIFFERENTIAL/PLATELET
BASOS ABS: 0 10*3/uL (ref 0–0.1)
BASOS PCT: 0 %
EOS PCT: 1 %
Eosinophils Absolute: 0 10*3/uL (ref 0–0.7)
HEMATOCRIT: 36.2 % — AB (ref 40.0–52.0)
Hemoglobin: 12.5 g/dL — ABNORMAL LOW (ref 13.0–18.0)
LYMPHS PCT: 34 %
Lymphs Abs: 0.8 10*3/uL — ABNORMAL LOW (ref 1.0–3.6)
MCH: 29 pg (ref 26.0–34.0)
MCHC: 34.5 g/dL (ref 32.0–36.0)
MCV: 84.3 fL (ref 80.0–100.0)
Monocytes Absolute: 0.3 10*3/uL (ref 0.2–1.0)
Monocytes Relative: 12 %
NEUTROS ABS: 1.2 10*3/uL — AB (ref 1.4–6.5)
Neutrophils Relative %: 53 %
PLATELETS: 248 10*3/uL (ref 150–440)
RBC: 4.3 MIL/uL — AB (ref 4.40–5.90)
RDW: 15.3 % — AB (ref 11.5–14.5)
WBC: 2.2 10*3/uL — AB (ref 3.8–10.6)

## 2017-05-17 NOTE — Assessment & Plan Note (Addendum)
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 Currently on Crizotinib 250 mg BID [since Nov 2017]. AUG 1st 2018- increased uptake in the prevascular lymph nodes; no significant uptake left upper medial lobe lung lesion; no distant metastasis.  # Currently on carbotaxol weekly along with radiation to the area of residual disease [until sep 27th]. Proceed with cycle #3 today. Labs today reviewed;  acceptable for treatment today except ANC- 1.2; okay for treatment today.    #  Elevated Blood sugars- 250s- likely secondary to steroids around chemotherapy. Recommend checking blood sugars closely; making a log- calling PCP for further recommendations  # CKD- multifactorial [prostatitic obstruction/diabetes]-  Creat ~1.39/ monitor for now.   # Bil LE swelling CKD/ Crizotinib-STABLE. Continue stockings for now.   # Bilateral PE left lower extremity DVT- on xarelto.  # follow up in 2 weeks/labs- carbo-taxol.; weekly carbo-taxol/labs.

## 2017-05-17 NOTE — Progress Notes (Signed)
[facility] NOTE  Patient Care Team: Roselee Nova, MD as PCP - General (Family Medicine)  CHIEF COMPLAINTS/PURPOSE OF CONSULTATION:  Lung cancer  #  Oncology History   # OCT 2017- ADENO CA LUNG; STAGE IV [; LUL; bil supraclavicular LN; Left neck LN Bx]; ROS-1 MUTATED; s/p Carbo-alimta x1[oct 2017]  # NOV 1st 2017- XALKORI 250 mg BID; JAN 15th CT- PR;  # AUG 27th Chemo-RT to persistent LUL/mediastinal LN  # LLE DVT/bil PE/Multiple strokes [? On xarelto]-Lovenox; Jan mid 2018- xarelto [lovenox-insurance issues]  # MRI brain- multiple infarcts [2d echo/bubble study-NEG's/p Neurology eval]  # # s/p TURP [Sep 2017; Dr.Cope] DEC 26th CT- distended bladder   # MOLECULAR TESTING- ROS-1 POSITIVE; ALK/EGFR-NEG; PDL-1 EXPRESSION- 90%** [HIGH]     Primary cancer of left upper lobe of lung ([place])     HISTORY OF PRESENTING ILLNESS:  Melvin Willis 66 y.o.  male with newly diagnosedMetastatic lung cancer ROS- positive- currently carbo-taxol with RT for residual/persistent Left upper lung mass/atelectasis.  Patient admits to slightly elevated sugars in the range of 250s at home.   He denies any worsening difficulty breathing. Denies any worsening cough.He denies any new lumps or bumps. Denies any difficulty urination. No diarrhea or constipation. Denies any headaches or vision changes. No fevers or chills. No nausea no vomiting.  ROS: A complete 10 point review of system is done which is negative except mentioned above in history of present illness  MEDICAL HISTORY:  Past Medical History:  Diagnosis Date  . Abnormal prostate specific antigen 08/08/2012  . Adiposity 04/16/2015  . Chronic kidney disease (CKD), stage III (moderate) 11/27/2016  . CVA (cerebral vascular accident) ([place]) 06/17/2016  . Diabetes mellitus without complication ([place])   . Diverticulosis of sigmoid colon 04/16/2015  . Dyslipidemia 03/18/2015  . Hemorrhoids, internal 04/16/2015  .  Hypercholesteremia 04/16/2015  . Hyperlipidemia   . Hypertension   . Primary cancer of left upper lobe of lung ([place])   . Pulmonary embolism ([place])   . Wears dentures    partial upper    SURGICAL HISTORY: Past Surgical History:  Procedure Laterality Date  . COLONOSCOPY    . COLONOSCOPY WITH PROPOFOL N/A 05/06/2015   Procedure: COLONOSCOPY WITH PROPOFOL;  Surgeon: Lucilla Lame, MD;  Location: [facility];  Service: Endoscopy;  Laterality: N/A;  ASCENDING COLON POLYPS X 2 TERMINAL ILEUM BIOPSY RANDOM COLON BX. TRANSVERSE COLON POLYP SIGMOID COLON POLYP  . ESOPHAGOGASTRODUODENOSCOPY (EGD) WITH PROPOFOL N/A 05/06/2015   Procedure: ESOPHAGOGASTRODUODENOSCOPY (EGD) WITH PROPOFOL;  Surgeon: Lucilla Lame, MD;  Location: [facility];  Service: Endoscopy;  Laterality: N/A;  GASTRIC BIOPSY X1    SOCIAL HISTORY: Social History   Social History  . Marital status: Married    Spouse name: N/A  . Number of children: N/A  . Years of education: N/A   Occupational History  . Not on file.   Social History Main Topics  . Smoking status: Never Smoker  . Smokeless tobacco: Never Used  . Alcohol use No  . Drug use: No  . Sexual activity: Yes    Partners: Female   Other Topics Concern  . Not on file   Social History Narrative  . No narrative on file    FAMILY HISTORY: Family History  Problem Relation Age of Onset  . Diabetes Mother   . Diabetes Father   . CAD Father   . Dementia Father   . Diabetes Sister   . Cancer Maternal  Uncle        Prostate  . Cancer Cousin        prostate    ALLERGIES:  has No Known Allergies.  MEDICATIONS:  Current Outpatient Prescriptions  Medication Sig Dispense Refill  . atorvastatin (LIPITOR) 40 MG tablet Take 1 tablet (40 mg total) by mouth daily at 6 PM. 90 tablet 0  . finasteride (PROSCAR) 5 MG tablet Take 1 tablet (5 mg total) by mouth at bedtime. 14 tablet 0  . glipiZIDE (GLUCOTROL) 5 MG tablet Take 5 mg by mouth daily before  breakfast.    . metFORMIN (GLUCOPHAGE) 500 MG tablet Take 1 tablet (500 mg total) by mouth 2 (two) times daily with a meal. 180 tablet 0  . rivaroxaban (XARELTO) 20 MG TABS tablet Take 1 tablet (20 mg total) by mouth daily with supper. 30 tablet 11  . tamsulosin (FLOMAX) 0.4 MG CAPS capsule Take 0.4 mg by mouth every morning.     . ondansetron (ZOFRAN) 8 MG tablet Take 1 tablet (8 mg total) by mouth every 8 (eight) hours as needed for nausea or vomiting (start 3 days; after chemo). (Patient not taking: Reported on 05/03/2017) 40 tablet 1  . prochlorperazine (COMPAZINE) 10 MG tablet Take 1 tablet (10 mg total) by mouth every 6 (six) hours as needed for nausea or vomiting. (Patient not taking: Reported on 05/03/2017) 40 tablet 1   No current facility-administered medications for this visit.       Marland Kitchen  PHYSICAL EXAMINATION: ECOG PERFORMANCE STATUS: 2 - Symptomatic, <50% confined to bed  Vitals:   05/17/17 0850  BP: (!) 141/96  Pulse: 82  Resp: 20  Temp: 97.6 F (36.4 C)   Filed Weights   05/17/17 0850  Weight: 168 lb (76.2 kg)    GENERAL: Well-nourished well-developed; Alert, no distress and comfortable. HE is alone.  EYES: no pallor or icterus OROPHARYNX: no thrush or ulceration; good dentition  NECK: supple, no masses felt LYMPH:  no palpable lymphadenopathy in the cervical, axillary or inguinal regions LUNGS: clear to auscultation and  No wheeze or crackles HEART/CVS: regular rate & rhythm and no murmurs; No lower extremity edema ABDOMEN: abdomen soft, non-tender and normal bowel sounds Musculoskeletal:no cyanosis of digits and no clubbing  PSYCH: alert & oriented x 3 with fluent speech NEURO: no focal motor/sensory deficits SKIN:  no rashes or significant lesions  LABORATORY DATA:  I have reviewed the data as listed Lab Results  Component Value Date   WBC 2.2 (L) 05/17/2017   HGB 12.5 (L) 05/17/2017   HCT 36.2 (L) 05/17/2017   MCV 84.3 05/17/2017   PLT 248 05/17/2017     Recent Labs  04/19/17 1034 05/03/17 0943 05/11/17 0821 05/17/17 0815  NA 134* 133* 134* 135  K 4.7 4.4 4.0 4.0  CL 104 103 106 107  CO2 23 21* 22 23  GLUCOSE 192* 191* 169* 157*  BUN _0 CREATININE 1.56* 1.39* 1.20 1.08  CALCIUM 8.9 8.7* 8.7* 9.1  GFRNONAA 46* 52* >60 >60  GFRAA 53* >60 >60 >60  PROT 6.9 6.7  --  6.6  ALBUMIN 3.5 3.6  --  3.5  AST 33 33  --  29  ALT 56 55  --  52  ALKPHOS 96 101  --  76  BILITOT 0.7 0.6  --  1.2    RADIOGRAPHIC STUDIES: I have personally reviewed the radiological images as listed and agreed with the findings in the report. No results  found. IMPRESSION: 4.3 x 2.5 cm mass-like opacity in the medial left upper lobe, corresponding to the patient's known primary bronchogenic neoplasm grossly unchanged.  Associated prevascular lymphadenopathy measuring up to 14 mm short axis, unchanged.   Electronically Signed   By: Julian Hy M.D.   On: 03/22/2017 12:55  ASSESSMENT & PLAN:   Primary cancer of left upper lobe of lung ([place]) # Adenocarcinoma of the lung metastatic/stage IV- bilateral supraclavicular adenopathy; ROS-1 POSITIVE. Currently on Crizotinib 250 mg BID [since Nov 2017]. AUG 1st 2018- increased uptake in the prevascular lymph nodes; no significant uptake left upper medial lobe lung lesion; no distant metastasis.  # Currently on carbotaxol weekly along with radiation to the area of residual disease [until sep 27th]. Proceed with cycle #3 today. Labs today reviewed;  acceptable for treatment today except ANC- 1.2; okay for treatment today.    #  Elevated Blood sugars- 250s- likely secondary to steroids around chemotherapy. Recommend checking blood sugars closely; making a log- calling PCP for further recommendations  # CKD- multifactorial [prostatitic obstruction/diabetes]-  Creat ~1.39/ monitor for now.   # Bil LE swelling CKD/ Crizotinib-STABLE. Continue stockings for now.   # Bilateral PE left lower  extremity DVT- on xarelto.  # follow up in 2 weeks/labs- carbo-taxol.; weekly carbo-taxol/labs.      Cammie Sickle, MD 05/17/2017 11:32 AM

## 2017-05-18 ENCOUNTER — Inpatient Hospital Stay: Payer: PRIVATE HEALTH INSURANCE

## 2017-05-18 ENCOUNTER — Ambulatory Visit
Admission: RE | Admit: 2017-05-18 | Discharge: 2017-05-18 | Disposition: A | Discharge status: Home / Self Care | Payer: PRIVATE HEALTH INSURANCE | Source: Ambulatory Visit | Attending: Radiation Oncology | Admitting: Radiation Oncology

## 2017-05-18 DIAGNOSIS — Z510 Encounter for antineoplastic radiation therapy: Secondary | ICD-10-CM | POA: Diagnosis not present

## 2017-05-18 DIAGNOSIS — C3412 Malignant neoplasm of upper lobe, left bronchus or lung: Secondary | ICD-10-CM

## 2017-05-18 DIAGNOSIS — Z5111 Encounter for antineoplastic chemotherapy: Secondary | ICD-10-CM | POA: Diagnosis not present

## 2017-05-18 MED ORDER — SODIUM CHLORIDE 0.9 % IV SOLN
Freq: Once | INTRAVENOUS | Status: AC
  Administered 2017-05-18: 09:00:00 via INTRAVENOUS
  Filled 2017-05-18: qty 1000

## 2017-05-18 MED ORDER — SODIUM CHLORIDE 0.9 % IV SOLN
190.0000 mg | Freq: Once | INTRAVENOUS | Status: AC
  Administered 2017-05-18: 190 mg via INTRAVENOUS
  Filled 2017-05-18: qty 19

## 2017-05-18 MED ORDER — PALONOSETRON HCL INJECTION 0.25 MG/5ML
0.2500 mg | Freq: Once | INTRAVENOUS | Status: AC
  Administered 2017-05-18: 0.25 mg via INTRAVENOUS
  Filled 2017-05-18: qty 5

## 2017-05-18 MED ORDER — FAMOTIDINE IN NACL 20-0.9 MG/50ML-% IV SOLN
20.0000 mg | Freq: Once | INTRAVENOUS | Status: AC
  Administered 2017-05-18: 20 mg via INTRAVENOUS
  Filled 2017-05-18: qty 50

## 2017-05-18 MED ORDER — SODIUM CHLORIDE 0.9 % IV SOLN
20.0000 mg | Freq: Once | INTRAVENOUS | Status: AC
  Administered 2017-05-18: 20 mg via INTRAVENOUS
  Filled 2017-05-18: qty 2

## 2017-05-18 MED ORDER — PACLITAXEL CHEMO INJECTION 300 MG/50ML
45.0000 mg/m2 | Freq: Once | INTRAVENOUS | Status: AC
  Administered 2017-05-18: 84 mg via INTRAVENOUS
  Filled 2017-05-18: qty 14

## 2017-05-18 MED ORDER — DIPHENHYDRAMINE HCL 50 MG/ML IJ SOLN
50.0000 mg | Freq: Once | INTRAMUSCULAR | Status: AC
  Administered 2017-05-18: 50 mg via INTRAVENOUS
  Filled 2017-05-18: qty 1

## 2017-05-19 ENCOUNTER — Ambulatory Visit
Admission: RE | Admit: 2017-05-19 | Discharge: 2017-05-19 | Disposition: A | Discharge status: Home / Self Care | Payer: PRIVATE HEALTH INSURANCE | Source: Ambulatory Visit | Attending: Radiation Oncology | Admitting: Radiation Oncology

## 2017-05-19 DIAGNOSIS — Z510 Encounter for antineoplastic radiation therapy: Secondary | ICD-10-CM | POA: Diagnosis not present

## 2017-05-20 ENCOUNTER — Ambulatory Visit
Admission: RE | Admit: 2017-05-20 | Discharge: 2017-05-20 | Disposition: A | Discharge status: Home / Self Care | Payer: PRIVATE HEALTH INSURANCE | Source: Ambulatory Visit | Attending: Radiation Oncology | Admitting: Radiation Oncology

## 2017-05-20 DIAGNOSIS — Z510 Encounter for antineoplastic radiation therapy: Secondary | ICD-10-CM | POA: Diagnosis not present

## 2017-05-21 ENCOUNTER — Ambulatory Visit
Admission: RE | Admit: 2017-05-21 | Discharge: 2017-05-21 | Disposition: A | Discharge status: Home / Self Care | Payer: PRIVATE HEALTH INSURANCE | Source: Ambulatory Visit | Attending: Radiation Oncology | Admitting: Radiation Oncology

## 2017-05-21 DIAGNOSIS — Z510 Encounter for antineoplastic radiation therapy: Secondary | ICD-10-CM | POA: Diagnosis not present

## 2017-05-24 ENCOUNTER — Ambulatory Visit: Payer: PRIVATE HEALTH INSURANCE

## 2017-05-24 ENCOUNTER — Inpatient Hospital Stay (HOSPITAL_BASED_OUTPATIENT_CLINIC_OR_DEPARTMENT_OTHER): Payer: PRIVATE HEALTH INSURANCE | Admitting: Internal Medicine

## 2017-05-24 ENCOUNTER — Inpatient Hospital Stay: Payer: PRIVATE HEALTH INSURANCE

## 2017-05-24 ENCOUNTER — Ambulatory Visit: Admission: RE | Admit: 2017-05-24 | Payer: PRIVATE HEALTH INSURANCE | Source: Ambulatory Visit

## 2017-05-24 VITALS — BP 125/86 | HR 87 | Temp 97.8°F | Resp 20 | Ht 64.0 in | Wt 168.9 lb

## 2017-05-24 DIAGNOSIS — C7800 Secondary malignant neoplasm of unspecified lung: Secondary | ICD-10-CM | POA: Diagnosis not present

## 2017-05-24 DIAGNOSIS — C3412 Malignant neoplasm of upper lobe, left bronchus or lung: Secondary | ICD-10-CM

## 2017-05-24 DIAGNOSIS — E1165 Type 2 diabetes mellitus with hyperglycemia: Secondary | ICD-10-CM | POA: Diagnosis not present

## 2017-05-24 DIAGNOSIS — R599 Enlarged lymph nodes, unspecified: Secondary | ICD-10-CM

## 2017-05-24 DIAGNOSIS — Z7984 Long term (current) use of oral hypoglycemic drugs: Secondary | ICD-10-CM

## 2017-05-24 DIAGNOSIS — K648 Other hemorrhoids: Secondary | ICD-10-CM | POA: Diagnosis not present

## 2017-05-24 DIAGNOSIS — E785 Hyperlipidemia, unspecified: Secondary | ICD-10-CM

## 2017-05-24 DIAGNOSIS — Z86711 Personal history of pulmonary embolism: Secondary | ICD-10-CM | POA: Diagnosis not present

## 2017-05-24 DIAGNOSIS — Z5111 Encounter for antineoplastic chemotherapy: Secondary | ICD-10-CM | POA: Diagnosis not present

## 2017-05-24 DIAGNOSIS — Z8719 Personal history of other diseases of the digestive system: Secondary | ICD-10-CM

## 2017-05-24 DIAGNOSIS — Z79899 Other long term (current) drug therapy: Secondary | ICD-10-CM

## 2017-05-24 DIAGNOSIS — Z923 Personal history of irradiation: Secondary | ICD-10-CM

## 2017-05-24 DIAGNOSIS — Z86718 Personal history of other venous thrombosis and embolism: Secondary | ICD-10-CM

## 2017-05-24 DIAGNOSIS — Z7901 Long term (current) use of anticoagulants: Secondary | ICD-10-CM

## 2017-05-24 DIAGNOSIS — E7800 Pure hypercholesterolemia, unspecified: Secondary | ICD-10-CM

## 2017-05-24 DIAGNOSIS — N183 Chronic kidney disease, stage 3 (moderate): Secondary | ICD-10-CM | POA: Diagnosis not present

## 2017-05-24 DIAGNOSIS — R197 Diarrhea, unspecified: Secondary | ICD-10-CM | POA: Diagnosis not present

## 2017-05-24 DIAGNOSIS — I129 Hypertensive chronic kidney disease with stage 1 through stage 4 chronic kidney disease, or unspecified chronic kidney disease: Secondary | ICD-10-CM

## 2017-05-24 DIAGNOSIS — Z8042 Family history of malignant neoplasm of prostate: Secondary | ICD-10-CM

## 2017-05-24 DIAGNOSIS — Z8673 Personal history of transient ischemic attack (TIA), and cerebral infarction without residual deficits: Secondary | ICD-10-CM

## 2017-05-24 LAB — BASIC METABOLIC PANEL
ANION GAP: 8 (ref 5–15)
BUN: 10 mg/dL (ref 6–20)
CHLORIDE: 104 mmol/L (ref 101–111)
CO2: 24 mmol/L (ref 22–32)
Calcium: 9.1 mg/dL (ref 8.9–10.3)
Creatinine, Ser: 1.2 mg/dL (ref 0.61–1.24)
Glucose, Bld: 194 mg/dL — ABNORMAL HIGH (ref 65–99)
Potassium: 3.9 mmol/L (ref 3.5–5.1)
Sodium: 136 mmol/L (ref 135–145)

## 2017-05-24 LAB — CBC WITH DIFFERENTIAL/PLATELET
BASOS ABS: 0 10*3/uL (ref 0–0.1)
Basophils Relative: 1 %
EOS PCT: 1 %
Eosinophils Absolute: 0 10*3/uL (ref 0–0.7)
HCT: 36.4 % — ABNORMAL LOW (ref 40.0–52.0)
HEMOGLOBIN: 12.6 g/dL — AB (ref 13.0–18.0)
LYMPHS ABS: 0.4 10*3/uL — AB (ref 1.0–3.6)
LYMPHS PCT: 22 %
MCH: 29.5 pg (ref 26.0–34.0)
MCHC: 34.7 g/dL (ref 32.0–36.0)
MCV: 85.1 fL (ref 80.0–100.0)
Monocytes Absolute: 0.2 10*3/uL (ref 0.2–1.0)
Monocytes Relative: 10 %
NEUTROS ABS: 1.3 10*3/uL — AB (ref 1.4–6.5)
NEUTROS PCT: 66 %
PLATELETS: 279 10*3/uL (ref 150–440)
RBC: 4.28 MIL/uL — AB (ref 4.40–5.90)
RDW: 15.6 % — ABNORMAL HIGH (ref 11.5–14.5)
WBC: 2 10*3/uL — ABNORMAL LOW (ref 3.8–10.6)

## 2017-05-24 MED ORDER — PACLITAXEL CHEMO INJECTION 300 MG/50ML
45.0000 mg/m2 | Freq: Once | INTRAVENOUS | Status: AC
  Administered 2017-05-24: 84 mg via INTRAVENOUS
  Filled 2017-05-24: qty 14

## 2017-05-24 MED ORDER — HEPARIN SOD (PORK) LOCK FLUSH 100 UNIT/ML IV SOLN: 500.0000 [IU] | Freq: Once | INTRAVENOUS | Status: DC | PRN

## 2017-05-24 MED ORDER — FAMOTIDINE IN NACL 20-0.9 MG/50ML-% IV SOLN
20.0000 mg | Freq: Once | INTRAVENOUS | Status: AC
  Administered 2017-05-24: 20 mg via INTRAVENOUS
  Filled 2017-05-24: qty 50

## 2017-05-24 MED ORDER — SODIUM CHLORIDE 0.9 % IV SOLN
190.0000 mg | Freq: Once | INTRAVENOUS | Status: AC
  Administered 2017-05-24: 190 mg via INTRAVENOUS
  Filled 2017-05-24: qty 19

## 2017-05-24 MED ORDER — PALONOSETRON HCL INJECTION 0.25 MG/5ML
0.2500 mg | Freq: Once | INTRAVENOUS | Status: AC
  Administered 2017-05-24: 0.25 mg via INTRAVENOUS
  Filled 2017-05-24: qty 5

## 2017-05-24 MED ORDER — SODIUM CHLORIDE 0.9 % IV SOLN
Freq: Once | INTRAVENOUS | Status: AC
  Administered 2017-05-24: 10:00:00 via INTRAVENOUS
  Filled 2017-05-24: qty 1000

## 2017-05-24 MED ORDER — SODIUM CHLORIDE 0.9% FLUSH
10.0000 mL | INTRAVENOUS | Status: DC | PRN
  Filled 2017-05-24: qty 10

## 2017-05-24 MED ORDER — DIPHENHYDRAMINE HCL 50 MG/ML IJ SOLN
50.0000 mg | Freq: Once | INTRAMUSCULAR | Status: AC
  Administered 2017-05-24: 50 mg via INTRAVENOUS
  Filled 2017-05-24: qty 1

## 2017-05-24 MED ORDER — SODIUM CHLORIDE 0.9 % IV SOLN
20.0000 mg | Freq: Once | INTRAVENOUS | Status: AC
  Administered 2017-05-24: 20 mg via INTRAVENOUS
  Filled 2017-05-24: qty 2

## 2017-05-24 NOTE — Progress Notes (Signed)
Patient here for follow-up for lung cancer and chemotherapy. Pt reports "4 loose stools per day" pt not taking any imodium AD

## 2017-05-24 NOTE — Assessment & Plan Note (Addendum)
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 Currently on Crizotinib 250 mg BID [since Nov 2017]. AUG 1st 2018- increased uptake in the prevascular lymph nodes; no significant uptake left upper medial lobe lung lesion; no distant metastasis.  # Currently on carbotaxol weekly along with radiation to the area of residual disease [until sep 27th]. Proceed with cycle #4  today. Labs today reviewed;  acceptable for treatment today except ANC- 1.3; okay for treatment today.    # diarrhea-G-2. recommend Imodium.  # CKD- multifactorial [prostatitic obstruction/diabetes]-  Creat ~1.39/ monitor for now.   # Bil LE swelling CKD/ Crizotinib-STABLE. Continue stockings for now.   # Bilateral PE left lower extremity DVT- on xarelto.  #  1 weeks/labs- carbo-taxol.; follow up with me in 4 weeks/labs; and then will likley resume Crizotinib.

## 2017-05-24 NOTE — Progress Notes (Signed)
[facility] NOTE  Patient Care Team: Roselee Nova, MD as PCP - General (Family Medicine)  CHIEF COMPLAINTS/PURPOSE OF CONSULTATION:  Lung cancer  #  Oncology History   # OCT 2017- ADENO CA LUNG; STAGE IV [; LUL; bil supraclavicular LN; Left neck LN Bx]; ROS-1 MUTATED; s/p Carbo-alimta x1[oct 2017]  # NOV 1st 2017- XALKORI 250 mg BID; JAN 15th CT- PR;  # AUG 27th Chemo-RT to persistent LUL/mediastinal LN  # LLE DVT/bil PE/Multiple strokes [? On xarelto]-Lovenox; Jan mid 2018- xarelto [lovenox-insurance issues]  # MRI brain- multiple infarcts [2d echo/bubble study-NEG's/p Neurology eval]  # # s/p TURP [Sep 2017; Dr.Cope] DEC 26th CT- distended bladder   # MOLECULAR TESTING- ROS-1 POSITIVE; ALK/EGFR-NEG; PDL-1 EXPRESSION- 90%** [HIGH]     Primary cancer of left upper lobe of lung ([place])     HISTORY OF PRESENTING ILLNESS:  Melvin Willis 66 y.o.  male with newly diagnosedMetastatic lung cancer ROS- positive- currently carbo-taxol with RT for residual/persistent Left upper lung mass/atelectasis.  Patient had episode of diarrhea 3-4 loose stools a day. That lasted for 2 days. Currently resolved. He did not take any Imodium/Lomotil.  He denies any worsening difficulty breathing. Denies any worsening cough.He denies any new lumps or bumps. Denies any difficulty urination. Denies any headaches or vision changes. No fevers or chills. No nausea no vomiting.  ROS: A complete 10 point review of system is done which is negative except mentioned above in history of present illness  MEDICAL HISTORY:  Past Medical History:  Diagnosis Date  . Abnormal prostate specific antigen 08/08/2012  . Adiposity 04/16/2015  . Chronic kidney disease (CKD), stage III (moderate) 11/27/2016  . CVA (cerebral vascular accident) ([place]) 06/17/2016  . Diabetes mellitus without complication ([place])   . Diverticulosis of sigmoid colon 04/16/2015  . Dyslipidemia 03/18/2015  .  Hemorrhoids, internal 04/16/2015  . Hypercholesteremia 04/16/2015  . Hyperlipidemia   . Hypertension   . Primary cancer of left upper lobe of lung ([place])   . Pulmonary embolism ([place])   . Wears dentures    partial upper    SURGICAL HISTORY: Past Surgical History:  Procedure Laterality Date  . COLONOSCOPY    . COLONOSCOPY WITH PROPOFOL N/A 05/06/2015   Procedure: COLONOSCOPY WITH PROPOFOL;  Surgeon: Lucilla Lame, MD;  Location: [facility];  Service: Endoscopy;  Laterality: N/A;  ASCENDING COLON POLYPS X 2 TERMINAL ILEUM BIOPSY RANDOM COLON BX. TRANSVERSE COLON POLYP SIGMOID COLON POLYP  . ESOPHAGOGASTRODUODENOSCOPY (EGD) WITH PROPOFOL N/A 05/06/2015   Procedure: ESOPHAGOGASTRODUODENOSCOPY (EGD) WITH PROPOFOL;  Surgeon: Lucilla Lame, MD;  Location: [facility];  Service: Endoscopy;  Laterality: N/A;  GASTRIC BIOPSY X1    SOCIAL HISTORY: Social History   Social History  . Marital status: Married    Spouse name: N/A  . Number of children: N/A  . Years of education: N/A   Occupational History  . Not on file.   Social History Main Topics  . Smoking status: Never Smoker  . Smokeless tobacco: Never Used  . Alcohol use No  . Drug use: No  . Sexual activity: Yes    Partners: Female   Other Topics Concern  . Not on file   Social History Narrative  . No narrative on file    FAMILY HISTORY: Family History  Problem Relation Age of Onset  . Diabetes Mother   . Diabetes Father   . CAD Father   . Dementia Father   . Diabetes Sister   .  Cancer Maternal Uncle        Prostate  . Cancer Cousin        prostate    ALLERGIES:  has No Known Allergies.  MEDICATIONS:  Current Outpatient Prescriptions  Medication Sig Dispense Refill  . atorvastatin (LIPITOR) 40 MG tablet Take 1 tablet (40 mg total) by mouth daily at 6 PM. 90 tablet 0  . finasteride (PROSCAR) 5 MG tablet Take 1 tablet (5 mg total) by mouth at bedtime. 14 tablet 0  . glipiZIDE (GLUCOTROL) 5 MG tablet  Take 5 mg by mouth daily before breakfast.    . metFORMIN (GLUCOPHAGE) 500 MG tablet Take 1 tablet (500 mg total) by mouth 2 (two) times daily with a meal. 180 tablet 0  . rivaroxaban (XARELTO) 20 MG TABS tablet Take 1 tablet (20 mg total) by mouth daily with supper. 30 tablet 11  . tamsulosin (FLOMAX) 0.4 MG CAPS capsule Take 0.4 mg by mouth every morning.     . ondansetron (ZOFRAN) 8 MG tablet Take 1 tablet (8 mg total) by mouth every 8 (eight) hours as needed for nausea or vomiting (start 3 days; after chemo). (Patient not taking: Reported on 05/03/2017) 40 tablet 1  . prochlorperazine (COMPAZINE) 10 MG tablet Take 1 tablet (10 mg total) by mouth every 6 (six) hours as needed for nausea or vomiting. (Patient not taking: Reported on 05/03/2017) 40 tablet 1   No current facility-administered medications for this visit.    Facility-Administered Medications Ordered in Other Visits  Medication Dose Route Frequency Provider Last Rate Last Dose  . CARBOplatin (PARAPLATIN) 190 mg in sodium chloride 0.9 % 250 mL chemo infusion  190 mg Intravenous Once Charlaine Dalton R, MD 538 mL/hr at 05/24/17 1215 190 mg at 05/24/17 1215  . heparin lock flush 100 unit/mL  500 Units Intracatheter Once PRN Charlaine Dalton R, MD      . sodium chloride flush (NS) 0.9 % injection 10 mL  10 mL Intracatheter PRN Cammie Sickle, MD          .  PHYSICAL EXAMINATION: ECOG PERFORMANCE STATUS: 2 - Symptomatic, <50% confined to bed  Vitals:   05/24/17 0916  BP: 125/86  Pulse: 87  Resp: 20  Temp: 97.8 F (36.6 C)   Filed Weights   05/24/17 0916  Weight: 168 lb 14.4 oz (76.6 kg)    GENERAL: Well-nourished well-developed; Alert, no distress and comfortable. HE is alone.  EYES: no pallor or icterus OROPHARYNX: no thrush or ulceration; good dentition  NECK: supple, no masses felt LYMPH:  no palpable lymphadenopathy in the cervical, axillary or inguinal regions LUNGS: clear to auscultation and  No  wheeze or crackles HEART/CVS: regular rate & rhythm and no murmurs; No lower extremity edema ABDOMEN: abdomen soft, non-tender and normal bowel sounds Musculoskeletal:no cyanosis of digits and no clubbing  PSYCH: alert & oriented x 3 with fluent speech NEURO: no focal motor/sensory deficits SKIN:  no rashes or significant lesions  LABORATORY DATA:  I have reviewed the data as listed Lab Results  Component Value Date   WBC 2.0 (L) 05/24/2017   HGB 12.6 (L) 05/24/2017   HCT 36.4 (L) 05/24/2017   MCV 85.1 05/24/2017   PLT 279 05/24/2017    Recent Labs  04/19/17 1034 05/03/17 0943 05/11/17 0821 05/17/17 0815 05/24/17 0845  NA 134* 133* 134* 135 136  K 4.7 4.4 4.0 4.0 3.9  CL 104 103 106 107 104  CO2 23 21* _0 GLUCOSE  192* 191* 169* 157* 194*  BUN _0 CREATININE 1.56* 1.39* 1.20 1.08 1.20  CALCIUM 8.9 8.7* 8.7* 9.1 9.1  GFRNONAA 46* 52* >60 >60 >60  GFRAA 53* >60 >60 >60 >60  PROT 6.9 6.7  --  6.6  --   ALBUMIN 3.5 3.6  --  3.5  --   AST 33 33  --  29  --   ALT 56 55  --  52  --   ALKPHOS 96 101  --  76  --   BILITOT 0.7 0.6  --  1.2  --     RADIOGRAPHIC STUDIES: I have personally reviewed the radiological images as listed and agreed with the findings in the report. No results found. IMPRESSION: 4.3 x 2.5 cm mass-like opacity in the medial left upper lobe, corresponding to the patient's known primary bronchogenic neoplasm grossly unchanged.  Associated prevascular lymphadenopathy measuring up to 14 mm short axis, unchanged.   Electronically Signed   By: Julian Hy M.D.   On: 03/22/2017 12:55  ASSESSMENT & PLAN:   Primary cancer of left upper lobe of lung ([place]) # Adenocarcinoma of the lung metastatic/stage IV- bilateral supraclavicular adenopathy; ROS-1 POSITIVE. Currently on Crizotinib 250 mg BID [since Nov 2017]. AUG 1st 2018- increased uptake in the prevascular lymph nodes; no significant uptake left upper medial lobe lung  lesion; no distant metastasis.  # Currently on carbotaxol weekly along with radiation to the area of residual disease [until sep 27th]. Proceed with cycle #4  today. Labs today reviewed;  acceptable for treatment today except ANC- 1.3; okay for treatment today.    # diarrhea-G-2. recommend Imodium.  # CKD- multifactorial [prostatitic obstruction/diabetes]-  Creat ~1.39/ monitor for now.   # Bil LE swelling CKD/ Crizotinib-STABLE. Continue stockings for now.   # Bilateral PE left lower extremity DVT- on xarelto.  #  1 weeks/labs- carbo-taxol.; follow up with me in 4 weeks/labs; and then will likley resume Crizotinib.      Cammie Sickle, MD 05/24/2017 12:38 PM

## 2017-05-24 NOTE — Progress Notes (Signed)
ANC 1.3 today.  Okay to proceed with treatment today per Jerene Pitch per Dr. Rogue Bussing.

## 2017-05-25 ENCOUNTER — Other Ambulatory Visit: Payer: Self-pay | Admitting: *Deleted

## 2017-05-25 ENCOUNTER — Ambulatory Visit
Admission: RE | Admit: 2017-05-25 | Discharge: 2017-05-25 | Disposition: A | Discharge status: Home / Self Care | Payer: PRIVATE HEALTH INSURANCE | Source: Ambulatory Visit | Attending: Radiation Oncology | Admitting: Radiation Oncology

## 2017-05-25 DIAGNOSIS — Z510 Encounter for antineoplastic radiation therapy: Secondary | ICD-10-CM | POA: Diagnosis not present

## 2017-05-25 MED ORDER — SUCRALFATE 1 G PO TABS: 1.0000 g | ORAL_TABLET | Freq: Three times a day (TID) | ORAL | 3 refills | Status: DC

## 2017-05-26 ENCOUNTER — Ambulatory Visit
Admission: RE | Admit: 2017-05-26 | Discharge: 2017-05-26 | Disposition: A | Discharge status: Home / Self Care | Payer: PRIVATE HEALTH INSURANCE | Source: Ambulatory Visit | Attending: Radiation Oncology | Admitting: Radiation Oncology

## 2017-05-26 DIAGNOSIS — Z510 Encounter for antineoplastic radiation therapy: Secondary | ICD-10-CM | POA: Diagnosis not present

## 2017-05-27 ENCOUNTER — Ambulatory Visit
Admission: RE | Admit: 2017-05-27 | Discharge: 2017-05-27 | Disposition: A | Discharge status: Home / Self Care | Payer: PRIVATE HEALTH INSURANCE | Source: Ambulatory Visit | Attending: Radiation Oncology | Admitting: Radiation Oncology

## 2017-05-27 DIAGNOSIS — Z510 Encounter for antineoplastic radiation therapy: Secondary | ICD-10-CM | POA: Diagnosis not present

## 2017-05-28 ENCOUNTER — Ambulatory Visit
Admission: RE | Admit: 2017-05-28 | Discharge: 2017-05-28 | Disposition: A | Discharge status: Home / Self Care | Payer: PRIVATE HEALTH INSURANCE | Source: Ambulatory Visit | Attending: Radiation Oncology | Admitting: Radiation Oncology

## 2017-05-28 DIAGNOSIS — Z510 Encounter for antineoplastic radiation therapy: Secondary | ICD-10-CM | POA: Diagnosis not present

## 2017-05-30 ENCOUNTER — Ambulatory Visit: Payer: PRIVATE HEALTH INSURANCE

## 2017-05-31 ENCOUNTER — Inpatient Hospital Stay: Payer: PRIVATE HEALTH INSURANCE

## 2017-05-31 ENCOUNTER — Other Ambulatory Visit: Payer: Self-pay | Admitting: Internal Medicine

## 2017-05-31 ENCOUNTER — Ambulatory Visit
Admission: RE | Admit: 2017-05-31 | Discharge: 2017-05-31 | Disposition: A | Discharge status: Home / Self Care | Payer: PRIVATE HEALTH INSURANCE | Source: Ambulatory Visit | Attending: Radiation Oncology | Admitting: Radiation Oncology

## 2017-05-31 ENCOUNTER — Ambulatory Visit: Payer: PRIVATE HEALTH INSURANCE | Admitting: Internal Medicine

## 2017-05-31 VITALS — BP 139/88 | HR 66 | Temp 96.6°F | Resp 20 | Wt 160.8 lb

## 2017-05-31 DIAGNOSIS — Z510 Encounter for antineoplastic radiation therapy: Secondary | ICD-10-CM | POA: Diagnosis not present

## 2017-05-31 DIAGNOSIS — C3412 Malignant neoplasm of upper lobe, left bronchus or lung: Secondary | ICD-10-CM

## 2017-05-31 DIAGNOSIS — Z5111 Encounter for antineoplastic chemotherapy: Secondary | ICD-10-CM | POA: Diagnosis not present

## 2017-05-31 LAB — CBC WITH DIFFERENTIAL/PLATELET
BASOS ABS: 0 10*3/uL (ref 0–0.1)
BASOS PCT: 1 %
EOS ABS: 0 10*3/uL (ref 0–0.7)
Eosinophils Relative: 1 %
HEMATOCRIT: 35 % — AB (ref 40.0–52.0)
Hemoglobin: 12 g/dL — ABNORMAL LOW (ref 13.0–18.0)
Lymphocytes Relative: 13 %
Lymphs Abs: 0.4 10*3/uL — ABNORMAL LOW (ref 1.0–3.6)
MCH: 29.4 pg (ref 26.0–34.0)
MCHC: 34.3 g/dL (ref 32.0–36.0)
MCV: 85.9 fL (ref 80.0–100.0)
MONO ABS: 0.3 10*3/uL (ref 0.2–1.0)
Monocytes Relative: 11 %
NEUTROS ABS: 2.2 10*3/uL (ref 1.4–6.5)
NEUTROS PCT: 74 %
PLATELETS: 285 10*3/uL (ref 150–440)
RBC: 4.07 MIL/uL — ABNORMAL LOW (ref 4.40–5.90)
RDW: 16.2 % — AB (ref 11.5–14.5)
WBC: 2.9 10*3/uL — ABNORMAL LOW (ref 3.8–10.6)

## 2017-05-31 LAB — COMPREHENSIVE METABOLIC PANEL
ALK PHOS: 73 U/L (ref 38–126)
ALT: 29 U/L (ref 17–63)
ANION GAP: 6 (ref 5–15)
AST: 18 U/L (ref 15–41)
Albumin: 3.8 g/dL (ref 3.5–5.0)
BILIRUBIN TOTAL: 0.6 mg/dL (ref 0.3–1.2)
BUN: 10 mg/dL (ref 6–20)
CALCIUM: 9.1 mg/dL (ref 8.9–10.3)
CO2: 25 mmol/L (ref 22–32)
Chloride: 106 mmol/L (ref 101–111)
Creatinine, Ser: 1.04 mg/dL (ref 0.61–1.24)
GFR calc non Af Amer: >60 mL/min (ref >60)
Glucose, Bld: 93 mg/dL (ref 65–99)
Potassium: 3.6 mmol/L (ref 3.5–5.1)
Sodium: 137 mmol/L (ref 135–145)
TOTAL PROTEIN: 6.7 g/dL (ref 6.5–8.1)

## 2017-05-31 MED ORDER — SODIUM CHLORIDE 0.9 % IV SOLN: 205.8000 mg | Freq: Once | INTRAVENOUS | Status: DC

## 2017-05-31 MED ORDER — SODIUM CHLORIDE 0.9 % IV SOLN
20.0000 mg | Freq: Once | INTRAVENOUS | Status: AC
  Administered 2017-05-31: 20 mg via INTRAVENOUS
  Filled 2017-05-31: qty 2

## 2017-05-31 MED ORDER — DEXTROSE 5 % IV SOLN
45.0000 mg/m2 | Freq: Once | INTRAVENOUS | Status: AC
  Administered 2017-05-31: 84 mg via INTRAVENOUS
  Filled 2017-05-31: qty 14

## 2017-05-31 MED ORDER — SODIUM CHLORIDE 0.9 % IV SOLN
190.0000 mg | Freq: Once | INTRAVENOUS | Status: AC
  Administered 2017-05-31: 190 mg via INTRAVENOUS
  Filled 2017-05-31: qty 19

## 2017-05-31 MED ORDER — FAMOTIDINE IN NACL 20-0.9 MG/50ML-% IV SOLN
20.0000 mg | Freq: Once | INTRAVENOUS | Status: AC
  Administered 2017-05-31: 20 mg via INTRAVENOUS
  Filled 2017-05-31: qty 50

## 2017-05-31 MED ORDER — DIPHENHYDRAMINE HCL 50 MG/ML IJ SOLN
50.0000 mg | Freq: Once | INTRAMUSCULAR | Status: AC
  Administered 2017-05-31: 50 mg via INTRAVENOUS
  Filled 2017-05-31: qty 1

## 2017-05-31 MED ORDER — PALONOSETRON HCL INJECTION 0.25 MG/5ML
0.2500 mg | Freq: Once | INTRAVENOUS | Status: AC
  Administered 2017-05-31: 0.25 mg via INTRAVENOUS
  Filled 2017-05-31: qty 5

## 2017-05-31 MED ORDER — SODIUM CHLORIDE 0.9 % IV SOLN
Freq: Once | INTRAVENOUS | Status: AC
Start: 2017-05-31 — End: 2017-05-31
  Administered 2017-05-31: 10:00:00 via INTRAVENOUS
  Filled 2017-05-31: qty 1000

## 2017-06-01 ENCOUNTER — Ambulatory Visit
Admission: RE | Admit: 2017-06-01 | Discharge: 2017-06-01 | Disposition: A | Discharge status: Home / Self Care | Payer: PRIVATE HEALTH INSURANCE | Source: Ambulatory Visit | Attending: Radiation Oncology | Admitting: Radiation Oncology

## 2017-06-01 DIAGNOSIS — Z510 Encounter for antineoplastic radiation therapy: Secondary | ICD-10-CM | POA: Diagnosis not present

## 2017-06-02 ENCOUNTER — Ambulatory Visit
Admission: RE | Admit: 2017-06-02 | Discharge: 2017-06-02 | Disposition: A | Discharge status: Home / Self Care | Payer: PRIVATE HEALTH INSURANCE | Source: Ambulatory Visit | Attending: Radiation Oncology | Admitting: Radiation Oncology

## 2017-06-02 ENCOUNTER — Ambulatory Visit: Payer: PRIVATE HEALTH INSURANCE

## 2017-06-02 DIAGNOSIS — Z510 Encounter for antineoplastic radiation therapy: Secondary | ICD-10-CM | POA: Diagnosis not present

## 2017-06-03 ENCOUNTER — Ambulatory Visit
Admission: RE | Admit: 2017-06-03 | Discharge: 2017-06-03 | Disposition: A | Discharge status: Home / Self Care | Payer: PRIVATE HEALTH INSURANCE | Source: Ambulatory Visit | Attending: Radiation Oncology | Admitting: Radiation Oncology

## 2017-06-03 DIAGNOSIS — Z510 Encounter for antineoplastic radiation therapy: Secondary | ICD-10-CM | POA: Diagnosis not present

## 2017-06-10 ENCOUNTER — Encounter: Payer: Self-pay | Admitting: Radiation Oncology

## 2017-06-10 ENCOUNTER — Ambulatory Visit
Admission: RE | Admit: 2017-06-10 | Discharge: 2017-06-10 | Disposition: A | Discharge status: Home / Self Care | Payer: PRIVATE HEALTH INSURANCE | Source: Ambulatory Visit | Attending: Radiation Oncology | Admitting: Radiation Oncology

## 2017-06-10 VITALS — BP 141/92 | HR 96 | Temp 98.1°F | Wt 157.1 lb

## 2017-06-10 DIAGNOSIS — C3412 Malignant neoplasm of upper lobe, left bronchus or lung: Secondary | ICD-10-CM | POA: Insufficient documentation

## 2017-06-10 DIAGNOSIS — Z510 Encounter for antineoplastic radiation therapy: Secondary | ICD-10-CM | POA: Insufficient documentation

## 2017-06-10 DIAGNOSIS — Z9221 Personal history of antineoplastic chemotherapy: Secondary | ICD-10-CM | POA: Insufficient documentation

## 2017-06-10 DIAGNOSIS — Z923 Personal history of irradiation: Secondary | ICD-10-CM | POA: Insufficient documentation

## 2017-06-10 IMAGING — CT CT HEAD W/O CM
3 series · 15 of 44 positions shown, 18 images · non-contrast
Comparison: CT head 06/17/2016, MRI head [DATE]

CLINICAL DATA: Slurred speech.  Left arm numbness.  Lung cancer.

EXAM:
CT HEAD WITHOUT CONTRAST
TECHNIQUE: Contiguous axial images were obtained from the base of the skull
through the vertex without intravenous contrast.

[Series 3: coronal soft tissue · coronal · 0.29mm/px · 3 of 62 slices shown]
[im 21/62  brain]
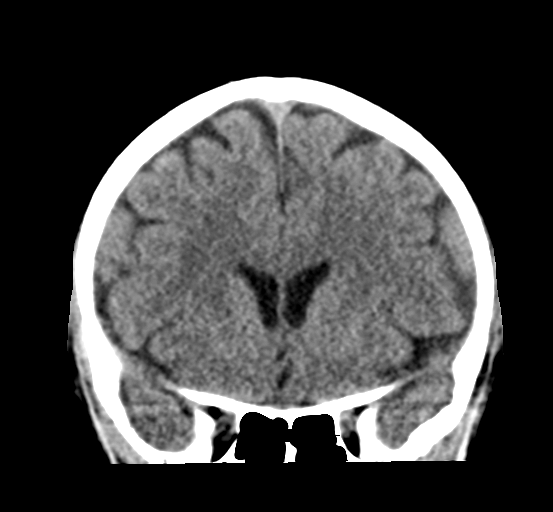
[im 28/62  brain]
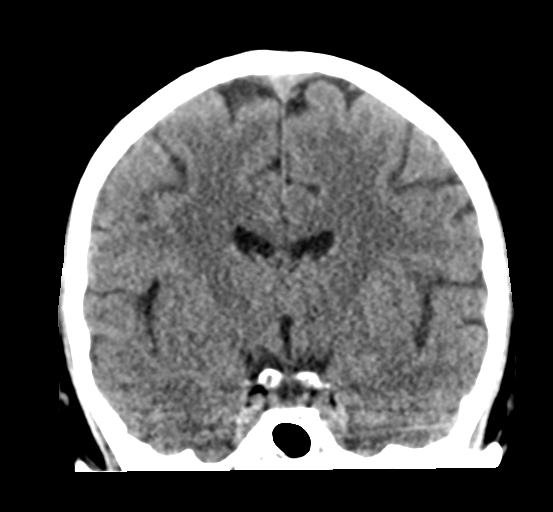
[im 34/62  brain]
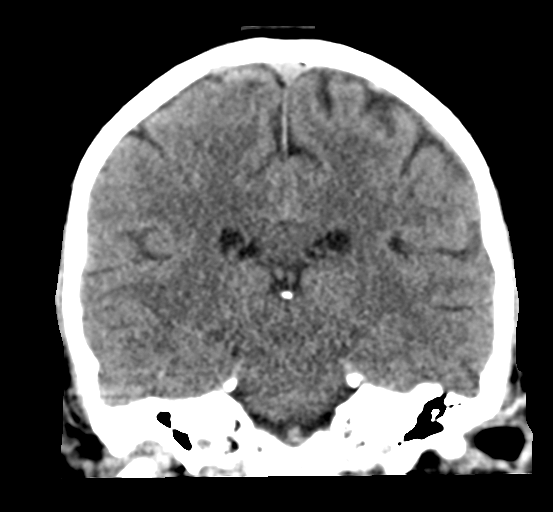

[Series 4: head wo · axial · 0.41mm/px · z∈[-94,+16]mm · 9 of 27 slices shown, 12 images]
[im 3/27  brain]
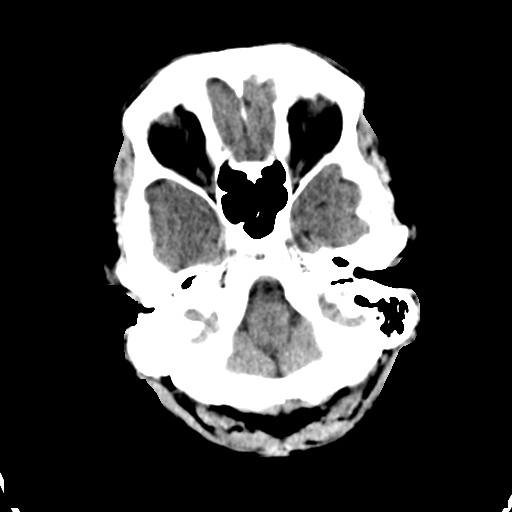
[im 3/27  bone]
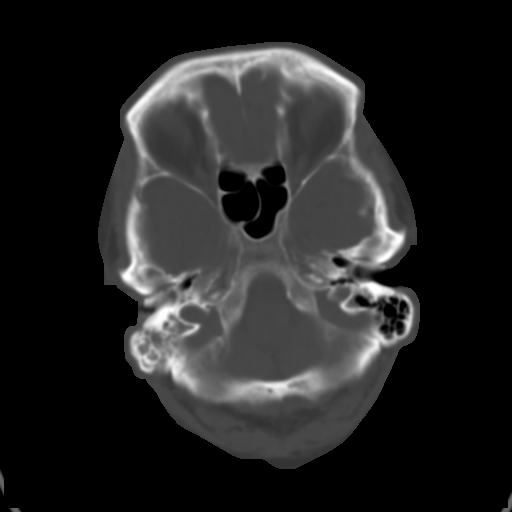
[im 6/27  brain]
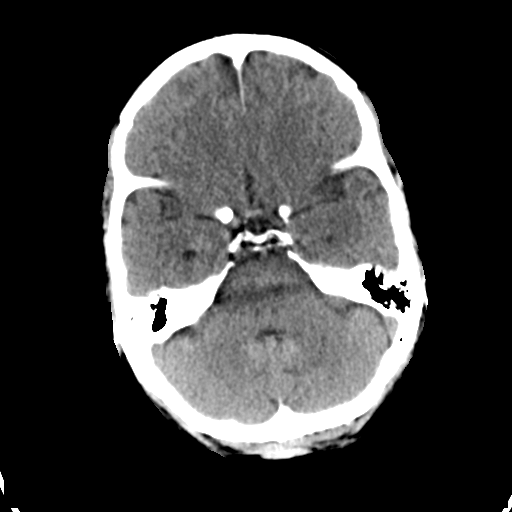
[im 8/27  brain]
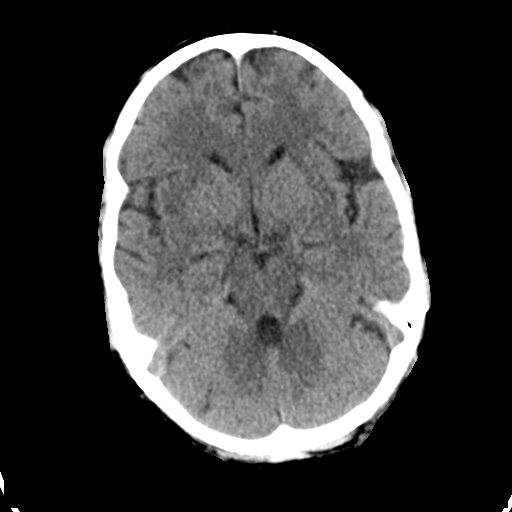
[im 11/27  brain]
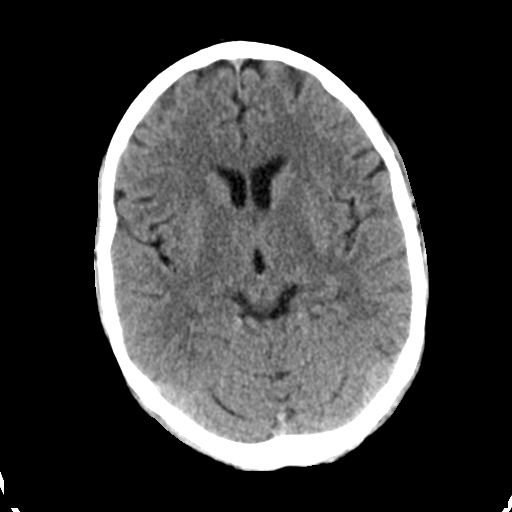
[im 14/27  brain]
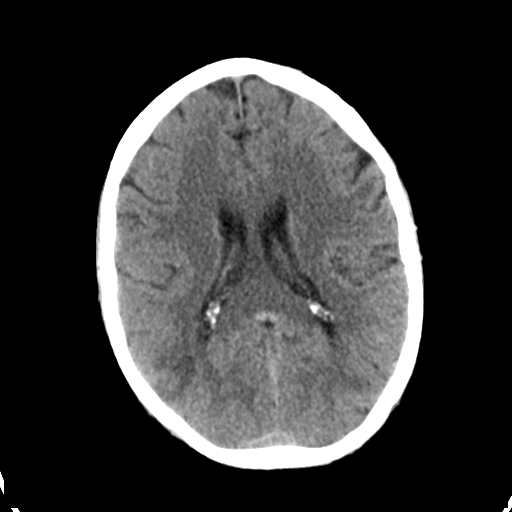
[im 14/27  bone]
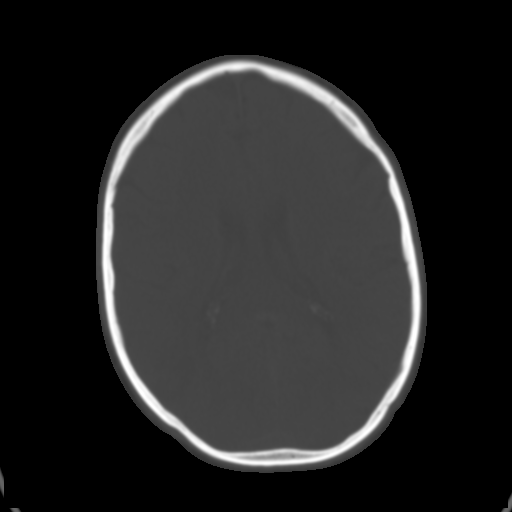
[im 17/27  brain]
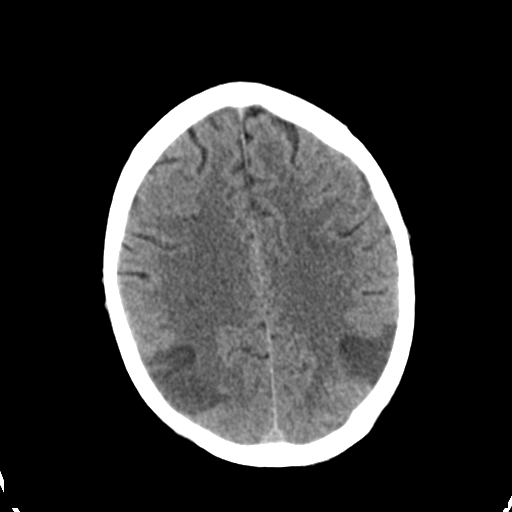
[im 20/27  brain]
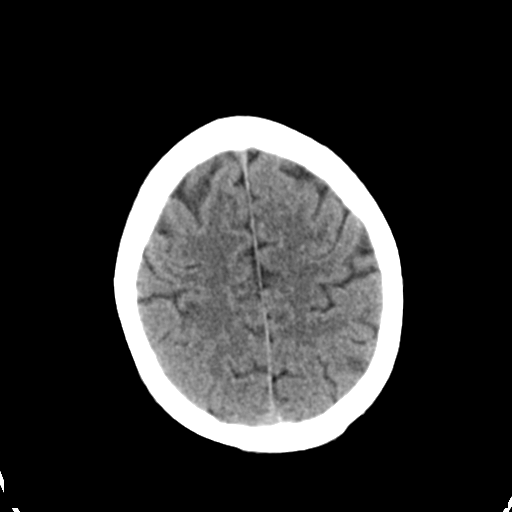
[im 22/27  brain]
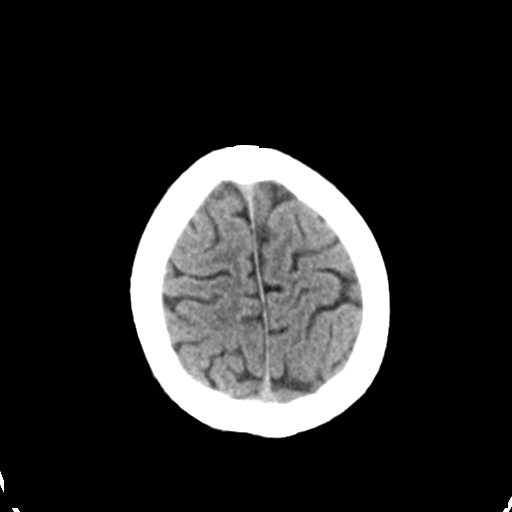
[im 25/27  brain]
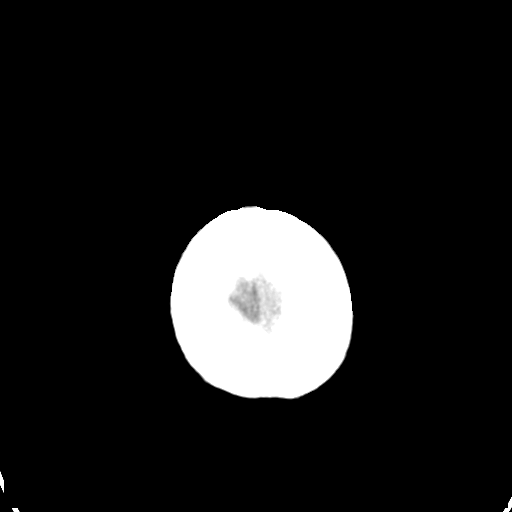
[im 25/27  bone]
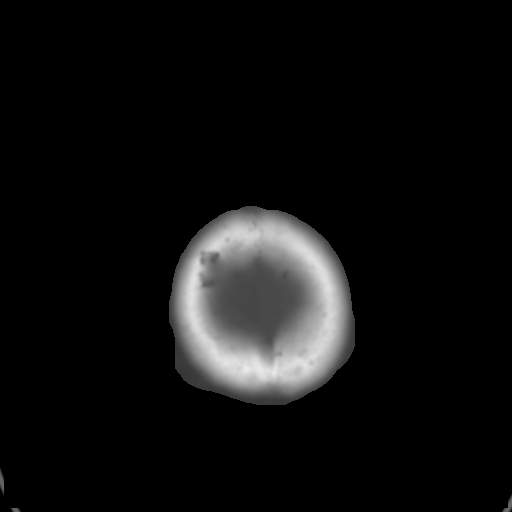

[Series 5: sagittal soft tissue · sagittal · 0.30mm/px · 3 of 49 slices shown]
[im 17/49  brain]
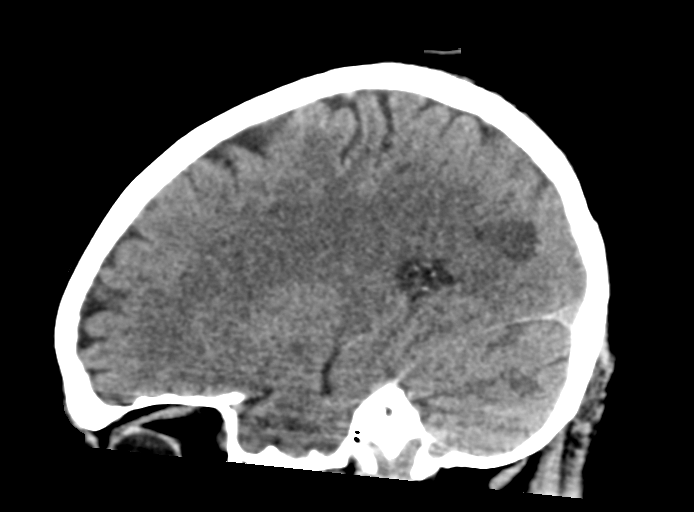
[im 25/49  brain]
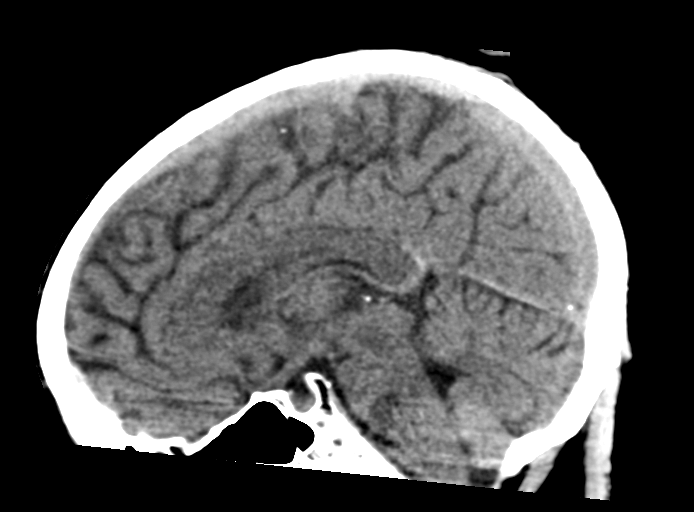
[im 33/49  brain]
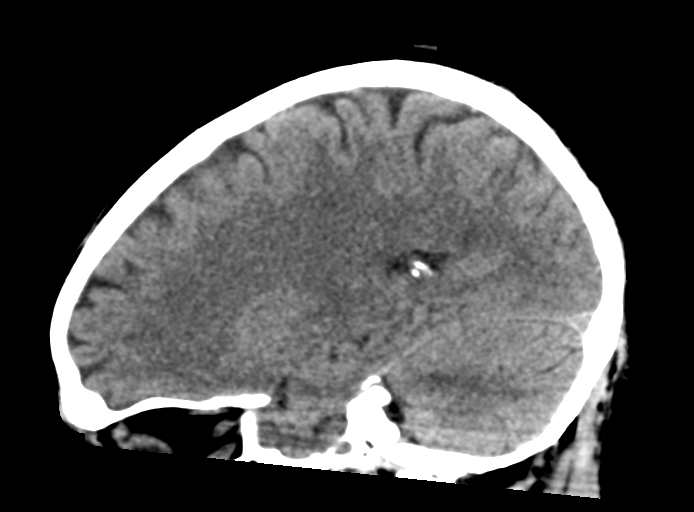

[15 of 44 positions shown; findings below may reference images not displayed]

FINDINGS: Brain: New areas of wedge-shaped hypodensity in the parietal lobe
bilaterally. This involves cortex and was not present previously.
This is most consistent with subacute infarction. No associated
hemorrhage.

Small hypodensity right cerebellum most consistent with chronic
infarction as noted on prior MRI. No acute hemorrhage. Ventricle
size normal. No shift of the midline structures. No other areas of
acute infarct.

Vascular: No hyperdense vessel or unexpected calcification.

Skull: Negative

Sinuses/Orbits: Negative

Other: None
IMPRESSION: New areas of wedge-shaped hypodensity in the parietal lobe
bilaterally involving cortex most consistent with subacute
infarction. In this patient with lung cancer, this is most
consistent with infarction rather than metastatic disease, as these
findings were not present 1 week ago. Patienthad multiple small
areas of embolic infarction on the prior MRI. Current parietal
infarcts are also consistent with emboli. No associated hemorrhage.

## 2017-06-10 NOTE — Progress Notes (Signed)
Radiation Oncology Follow up Note  Name: Melvin Willis   Date:   06/10/2017 MRN:  [id number] DOB: [birth year]    This 66 y.o. male presents to the clinic today for one-week follow-up status post initial radiation therapy to his chest for stage IV adenocarcinoma the lung.  REFERRING PROVIDER: Roselee Nova, MD  HPI: patient is a 66 year old male recently completed 4 week course of radiation therapy to his chest for stage IV adenocarcinoma of the left upper lobe. He did have bilateral supraclavicular involvement initially underwent carboplatinum and Alimta. Follow-up PET CT scan showed only disease in his mediastinum which was hypermetabolic. He is tolerated treatments well is doing fine at this time. He specifically denies cough hemoptysis or dysphagia..  COMPLICATIONS OF TREATMENT: none  FOLLOW UP COMPLIANCE: keeps appointments   PHYSICAL EXAM:  BP (!) 141/92   Pulse 96   Temp 98.1 F (36.7 C)   Wt 157 lb 1.2 oz (71.2 kg)   BMI 26.96 kg/m  Well-developed well-nourished patient in NAD. HEENT reveals PERLA, EOMI, discs not visualized.  Oral cavity is clear. No oral mucosal lesions are identified. Neck is clear without evidence of cervical or supraclavicular adenopathy. Lungs are clear to A&P. Cardiac examination is essentially unremarkable with regular rate and rhythm without murmur rub or thrill. Abdomen is benign with no organomegaly or masses noted. Motor sensory and DTR levels are equal and symmetric in the upper and lower extremities. Cranial nerves II through XII are grossly intact. Proprioception is intact. No peripheral adenopathy or edema is identified. No motor or sensory levels are noted. Crude visual fields are within normal range.  RADIOLOGY RESULTS: initial PET CT scan is again reviewed  PLAN: at this time I to go ahead since this is the only evidence of hypermetabolic disease in the patient at this point in time to consolidate treatment with another 2600 cGy in 13  fractions to the area of hypermetabolic activity in using PET/CT fusion study. Again risks and benefits of treatment including possible dysphasia fatigue alteration of blood counts and skin reaction were discussed in detail with the patient. I would like to take this opportunity to thank you for allowing me to participate in the care of your patient.Armstead Peaks., MD

## 2017-06-10 NOTE — Addendum Note (Signed)
Encounter addended by: Noreene Filbert, MD on: 06/10/2017 11:47 AM<BR>    Actions taken: LOS modified, Follow-up modified

## 2017-06-16 ENCOUNTER — Ambulatory Visit
Admission: RE | Admit: 2017-06-16 | Discharge: 2017-06-16 | Disposition: A | Discharge status: Home / Self Care | Payer: PRIVATE HEALTH INSURANCE | Source: Ambulatory Visit | Attending: Radiation Oncology | Admitting: Radiation Oncology

## 2017-06-21 ENCOUNTER — Other Ambulatory Visit: Payer: Self-pay

## 2017-06-21 ENCOUNTER — Inpatient Hospital Stay: Payer: PRIVATE HEALTH INSURANCE | Attending: Internal Medicine

## 2017-06-21 ENCOUNTER — Inpatient Hospital Stay (HOSPITAL_BASED_OUTPATIENT_CLINIC_OR_DEPARTMENT_OTHER): Payer: PRIVATE HEALTH INSURANCE | Admitting: Internal Medicine

## 2017-06-21 VITALS — BP 134/90 | HR 90 | Temp 97.8°F | Resp 20 | Ht 64.0 in | Wt 156.4 lb

## 2017-06-21 DIAGNOSIS — Z923 Personal history of irradiation: Secondary | ICD-10-CM

## 2017-06-21 DIAGNOSIS — E785 Hyperlipidemia, unspecified: Secondary | ICD-10-CM | POA: Diagnosis not present

## 2017-06-21 DIAGNOSIS — Z8719 Personal history of other diseases of the digestive system: Secondary | ICD-10-CM | POA: Insufficient documentation

## 2017-06-21 DIAGNOSIS — I129 Hypertensive chronic kidney disease with stage 1 through stage 4 chronic kidney disease, or unspecified chronic kidney disease: Secondary | ICD-10-CM

## 2017-06-21 DIAGNOSIS — R5383 Other fatigue: Secondary | ICD-10-CM

## 2017-06-21 DIAGNOSIS — M7989 Other specified soft tissue disorders: Secondary | ICD-10-CM | POA: Diagnosis not present

## 2017-06-21 DIAGNOSIS — Z7984 Long term (current) use of oral hypoglycemic drugs: Secondary | ICD-10-CM

## 2017-06-21 DIAGNOSIS — Z86718 Personal history of other venous thrombosis and embolism: Secondary | ICD-10-CM | POA: Insufficient documentation

## 2017-06-21 DIAGNOSIS — N183 Chronic kidney disease, stage 3 (moderate): Secondary | ICD-10-CM | POA: Diagnosis not present

## 2017-06-21 DIAGNOSIS — C3412 Malignant neoplasm of upper lobe, left bronchus or lung: Secondary | ICD-10-CM | POA: Diagnosis not present

## 2017-06-21 DIAGNOSIS — Z86711 Personal history of pulmonary embolism: Secondary | ICD-10-CM | POA: Diagnosis not present

## 2017-06-21 DIAGNOSIS — Z79899 Other long term (current) drug therapy: Secondary | ICD-10-CM

## 2017-06-21 DIAGNOSIS — R972 Elevated prostate specific antigen [PSA]: Secondary | ICD-10-CM | POA: Diagnosis not present

## 2017-06-21 DIAGNOSIS — E7800 Pure hypercholesterolemia, unspecified: Secondary | ICD-10-CM

## 2017-06-21 DIAGNOSIS — Z8042 Family history of malignant neoplasm of prostate: Secondary | ICD-10-CM | POA: Insufficient documentation

## 2017-06-21 DIAGNOSIS — Z9221 Personal history of antineoplastic chemotherapy: Secondary | ICD-10-CM | POA: Insufficient documentation

## 2017-06-21 DIAGNOSIS — E669 Obesity, unspecified: Secondary | ICD-10-CM

## 2017-06-21 DIAGNOSIS — E119 Type 2 diabetes mellitus without complications: Secondary | ICD-10-CM | POA: Insufficient documentation

## 2017-06-21 DIAGNOSIS — Z8673 Personal history of transient ischemic attack (TIA), and cerebral infarction without residual deficits: Secondary | ICD-10-CM | POA: Insufficient documentation

## 2017-06-21 DIAGNOSIS — Z7901 Long term (current) use of anticoagulants: Secondary | ICD-10-CM

## 2017-06-21 DIAGNOSIS — G629 Polyneuropathy, unspecified: Secondary | ICD-10-CM

## 2017-06-21 LAB — CBC WITH DIFFERENTIAL/PLATELET
BASOS ABS: 0 10*3/uL (ref 0–0.1)
BASOS PCT: 1 %
EOS ABS: 0 10*3/uL (ref 0–0.7)
EOS PCT: 1 %
HEMATOCRIT: 38.7 % — AB (ref 40.0–52.0)
Hemoglobin: 13 g/dL (ref 13.0–18.0)
Lymphocytes Relative: 21 %
Lymphs Abs: 0.8 10*3/uL — ABNORMAL LOW (ref 1.0–3.6)
MCH: 29.5 pg (ref 26.0–34.0)
MCHC: 33.6 g/dL (ref 32.0–36.0)
MCV: 87.9 fL (ref 80.0–100.0)
MONO ABS: 0.5 10*3/uL (ref 0.2–1.0)
Monocytes Relative: 12 %
NEUTROS ABS: 2.5 10*3/uL (ref 1.4–6.5)
NEUTROS PCT: 65 %
PLATELETS: 215 10*3/uL (ref 150–440)
RBC: 4.4 MIL/uL (ref 4.40–5.90)
RDW: 18.2 % — AB (ref 11.5–14.5)
WBC: 3.8 10*3/uL (ref 3.8–10.6)

## 2017-06-21 LAB — COMPREHENSIVE METABOLIC PANEL
ALBUMIN: 3.8 g/dL (ref 3.5–5.0)
ALT: 24 U/L (ref 17–63)
AST: 19 U/L (ref 15–41)
Alkaline Phosphatase: 87 U/L (ref 38–126)
Anion gap: 11 (ref 5–15)
BUN: 11 mg/dL (ref 6–20)
CHLORIDE: 103 mmol/L (ref 101–111)
CO2: 20 mmol/L — AB (ref 22–32)
Calcium: 9.2 mg/dL (ref 8.9–10.3)
Creatinine, Ser: 1.13 mg/dL (ref 0.61–1.24)
GFR calc Af Amer: >60 mL/min (ref >60)
GFR calc non Af Amer: >60 mL/min (ref >60)
GLUCOSE: 165 mg/dL — AB (ref 65–99)
POTASSIUM: 3.6 mmol/L (ref 3.5–5.1)
SODIUM: 134 mmol/L — AB (ref 135–145)
Total Bilirubin: 0.6 mg/dL (ref 0.3–1.2)
Total Protein: 7.2 g/dL (ref 6.5–8.1)

## 2017-06-21 NOTE — Assessment & Plan Note (Addendum)
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 Currently on Crizotinib 250 mg BID [since Nov 2017]. AUG 1st 2018- increased uptake in the prevascular lymph nodes; no significant uptake left upper medial lobe lung lesion; no distant metastasis. Will consider re-imaging in 4-6 weeks.   # Currently on carbotaxol weekly along with radiation to the area of residual disease [until sep 27th]. Completed 5 cycles of carbotaxol. Labs today reviewed. In setting of additional radiation and neuropathy, will hold off on additional carbotaxol at this time.   #Crizotinib- will plan to re-start Crizotinib. Ok to dose concurrently with radiation.   # CKD- multifactorial [prostatitic obstruction/diabetes]-  Creat ~1.39/ monitor for now.   # Bil LE swelling CKD/ Crizotinib-STABLE. Continue compression socks.  # Bilateral PE & left lower extremity DVT- improved. continues Xarelto.   #  RTC in 4 weeks for labs and follow up.

## 2017-06-21 NOTE — Progress Notes (Signed)
Patient here for follow-up for lung cancer

## 2017-06-21 NOTE — Progress Notes (Signed)
[place] NOTE  Patient Care Team: Roselee Nova, MD as PCP - General (Family Medicine)  CHIEF COMPLAINTS/PURPOSE OF CONSULTATION:  Lung cancer  #  Oncology History   # OCT 2017- ADENO CA LUNG; STAGE IV [; LUL; bil supraclavicular LN; Left neck LN Bx]; ROS-1 MUTATED; s/p Carbo-alimta x1[oct 2017]  # NOV 1st 2017- XALKORI 250 mg BID; JAN 15th CT- PR;  # AUG 27th Chemo-RT to persistent LUL/mediastinal LN  # LLE DVT/bil PE/Multiple strokes [? On xarelto]-Lovenox; Jan mid 2018- xarelto [lovenox-insurance issues]  # MRI brain- multiple infarcts [2d echo/bubble study-NEG's/p Neurology eval]  # # s/p TURP [Sep 2017; Dr.Cope] DEC 26th CT- distended bladder   # MOLECULAR TESTING- ROS-1 POSITIVE; ALK/EGFR-NEG; PDL-1 EXPRESSION- 90%** [HIGH]     Primary cancer of left upper lobe of lung ([place])     HISTORY OF PRESENTING ILLNESS:  Melvin Willis 66 y.o.  male with newly diagnosed metastatic lung cancer ROS- positive- currently s/p 5 cycles carbo-taxol with RT for residual/persistent Left upper lung mass/atelectasis.  Patient states that he is scheduled for additional 2 weeks of radiation given results from PET 04/07/17. He completed 4 weeks of XRT 06/03/17. He reports mild fatigue with radiation but no skin changes. He is not currently taking Crizotinib but he has medication on hand. He tolerated it well without side effects. 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 Last chemo was 05/31/17.   Diarrhea has resolved. He denies any worsening difficulty breathing. Denies any worsening cough.He denies any new lumps or bumps. Denies any difficulty urination. Denies any headaches or vision changes. No fevers or chills. No nausea or vomiting.  Today he is cheerful and is celebrating the arrival of a new grandchild.   ROS: A complete 10 point review of system is done which  is negative except mentioned above in history of present illness  MEDICAL HISTORY:  Past Medical History:  Diagnosis Date  . Abnormal prostate specific antigen 08/08/2012  . Adiposity 04/16/2015  . Chronic kidney disease (CKD), stage III (moderate) ([place]) 11/27/2016  . CVA (cerebral vascular accident) ([place]) 06/17/2016  . Diabetes mellitus without complication ([place])   . Diverticulosis of sigmoid colon 04/16/2015  . Dyslipidemia 03/18/2015  . Hemorrhoids, internal 04/16/2015  . Hypercholesteremia 04/16/2015  . Hyperlipidemia   . Hypertension   . Primary cancer of left upper lobe of lung ([place])   . Pulmonary embolism ([place])   . Wears dentures    partial upper    SURGICAL HISTORY: Past Surgical History:  Procedure Laterality Date  . COLONOSCOPY    . COLONOSCOPY WITH PROPOFOL N/A 05/06/2015   Procedure: COLONOSCOPY WITH PROPOFOL;  Surgeon: Lucilla Lame, MD;  Location: [place];  Service: Endoscopy;  Laterality: N/A;  ASCENDING COLON POLYPS X 2 TERMINAL ILEUM BIOPSY RANDOM COLON BX. TRANSVERSE COLON POLYP SIGMOID COLON POLYP  . ESOPHAGOGASTRODUODENOSCOPY (EGD) WITH PROPOFOL N/A 05/06/2015   Procedure: ESOPHAGOGASTRODUODENOSCOPY (EGD) WITH PROPOFOL;  Surgeon: Lucilla Lame, MD;  Location: [place];  Service: Endoscopy;  Laterality: N/A;  GASTRIC BIOPSY X1    SOCIAL HISTORY: Social History   Social History  . Marital status: Married    Spouse name: N/A  . Number of children: N/A  . Years of education: N/A   Occupational History  . Not on file.   Social History Main Topics  . Smoking status: Never Smoker  .  Smokeless tobacco: Never Used  . Alcohol use No  . Drug use: No  . Sexual activity: Yes    Partners: Female   Other Topics Concern  . Not on file   Social History Narrative  . No narrative on file    FAMILY HISTORY: Family History  Problem Relation Age of Onset  . Diabetes Mother   . Diabetes Father   . CAD Father   . Dementia Father   . Diabetes  Sister   . Cancer Maternal Uncle        Prostate  . Cancer Cousin        prostate    ALLERGIES:  has No Known Allergies.  MEDICATIONS:  Current Outpatient Prescriptions  Medication Sig Dispense Refill  . atorvastatin (LIPITOR) 40 MG tablet Take 1 tablet (40 mg total) by mouth daily at 6 PM. 90 tablet 0  . finasteride (PROSCAR) 5 MG tablet Take 1 tablet (5 mg total) by mouth at bedtime. 14 tablet 0  . glipiZIDE (GLUCOTROL) 5 MG tablet Take 5 mg by mouth daily before breakfast.    . metFORMIN (GLUCOPHAGE) 500 MG tablet Take 1 tablet (500 mg total) by mouth 2 (two) times daily with a meal. 180 tablet 0  . rivaroxaban (XARELTO) 20 MG TABS tablet Take 1 tablet (20 mg total) by mouth daily with supper. 30 tablet 11  . sucralfate (CARAFATE) 1 g tablet Take 1 tablet (1 g total) by mouth 3 (three) times daily. Dissolve in 2-3 tbsp warm water, swish and swallow. 90 tablet 3  . tamsulosin (FLOMAX) 0.4 MG CAPS capsule Take 0.4 mg by mouth every morning.     . Loperamide HCl (IMODIUM A-D PO) Take 1 tablet by mouth as needed (loose stool/diarrhea).    . ondansetron (ZOFRAN) 8 MG tablet Take 1 tablet (8 mg total) by mouth every 8 (eight) hours as needed for nausea or vomiting (start 3 days; after chemo). (Patient not taking: Reported on 05/03/2017) 40 tablet 1  . prochlorperazine (COMPAZINE) 10 MG tablet Take 1 tablet (10 mg total) by mouth every 6 (six) hours as needed for nausea or vomiting. (Patient not taking: Reported on 05/03/2017) 40 tablet 1   No current facility-administered medications for this visit.      PHYSICAL EXAMINATION: ECOG PERFORMANCE STATUS: 1 - Symptomatic but completely ambulatory  Vitals:   06/21/17 1120  BP: 134/90  Pulse: 90  Resp: 20  Temp: 97.8 F (36.6 C)   Filed Weights   06/21/17 1120  Weight: 156 lb 6.4 oz (70.9 kg)    GENERAL: Well-nourished well-developed; Alert, no distress and comfortable. He is alone.  EYES: no pallor or icterus OROPHARYNX: no thrush  or ulceration; good dentition   NECK: supple, no masses felt LYMPH:  no palpable lymphadenopathy in the cervical, axillary or inguinal regions LUNGS: clear to auscultation and  No wheeze or crackles HEART/CVS: regular rate & rhythm and no murmurs; No lower extremity edema. Compression socks in place. ABDOMEN: abdomen soft, non-tender and normal bowel sounds MSK:no cyanosis of digits and no clubbing  PSYCH: alert & oriented x 3 with fluent speech NEURO: no focal motor/sensory deficits SKIN:  no rashes or significant lesions  LABORATORY DATA:  I have reviewed the data as listed Lab Results  Component Value Date   WBC 3.8 06/21/2017   HGB 13.0 06/21/2017   HCT 38.7 (L) 06/21/2017   MCV 87.9 06/21/2017   PLT 215 06/21/2017    Recent Labs  05/17/17 0815 05/24/17 0845  05/31/17 0904 06/21/17 1040  NA 135 136 137 134*  K 4.0 3.9 3.6 3.6  CL 107 104 106 103  CO2 '23 24 25 ' 20*  GLUCOSE 157* 194* 93 165*  BUN '14 10 10 11  ' CREATININE 1.08 1.20 1.04 1.13  CALCIUM 9.1 9.1 9.1 9.2  GFRNONAA >60 >60 >60 >60  GFRAA >60 >60 >60 >60  PROT 6.6  --  6.7 7.2  ALBUMIN 3.5  --  3.8 3.8  AST 29  --  18 19  ALT 52  --  29 24  ALKPHOS 76  --  73 87  BILITOT 1.2  --  0.6 0.6    RADIOGRAPHIC STUDIES: I have personally reviewed the radiological images as listed and agreed with the findings in the report. No results found. IMPRESSION: 4.3 x 2.5 cm mass-like opacity in the medial left upper lobe, corresponding to the patient's known primary bronchogenic neoplasm grossly unchanged.  Associated prevascular lymphadenopathy measuring up to 14 mm short axis, unchanged.   Electronically Signed   By: Julian Hy M.D.   On: 03/22/2017 12:55  IMPRESSION: 1. Metabolic activity associated with persistently enlarged prevascular lymph nodes is concerning for residual carcinoma. Size stability raise the possibility of inflammatory lymph nodes. 2. No significant activity associated with  consolidated lung along the LEFT upper mediastinum consistent with treated tumor / lung. 3. No new or distant metastatic disease.   Electronically Signed   By: Suzy Bouchard M.D.   On: 04/07/2017 15:03  ASSESSMENT & PLAN:   Primary cancer of left upper lobe of lung ([place]) # Adenocarcinoma of the lung metastatic/stage IV- bilateral supraclavicular adenopathy; ROS-1 POSITIVE. Currently on Crizotinib 250 mg BID [since Nov 2017]. AUG 1st 2018- increased uptake in the prevascular lymph nodes; no significant uptake left upper medial lobe lung lesion; no distant metastasis. Will consider re-imaging in 4-6 weeks.   # Currently on carbotaxol weekly along with radiation to the area of residual disease [until sep 27th]. Completed 5 cycles of carbotaxol. Labs today reviewed. In setting of additional radiation and neuropathy, will hold off on additional carbotaxol at this time.   #Crizotinib- will plan to re-start Crizotinib. Ok to dose concurrently with radiation.   # CKD- multifactorial [prostatitic obstruction/diabetes]-  Creat ~1.39/ monitor for now.   # Bil LE swelling CKD/ Crizotinib-STABLE. Continue compression socks.  # Bilateral PE & left lower extremity DVT- improved. continues Xarelto.   #  RTC in 4 weeks for labs and follow up.   Beckey Rutter, DNP AGNP-C 06/21/17 12:53 PM    Verlon Au, NP 06/21/2017 1:19 PM

## 2017-06-23 ENCOUNTER — Ambulatory Visit
Admission: RE | Admit: 2017-06-23 | Discharge: 2017-06-23 | Disposition: A | Discharge status: Home / Self Care | Payer: PRIVATE HEALTH INSURANCE | Source: Ambulatory Visit | Attending: Radiation Oncology | Admitting: Radiation Oncology

## 2017-06-24 ENCOUNTER — Ambulatory Visit
Admission: RE | Admit: 2017-06-24 | Discharge: 2017-06-24 | Disposition: A | Discharge status: Home / Self Care | Payer: PRIVATE HEALTH INSURANCE | Source: Ambulatory Visit | Attending: Radiation Oncology | Admitting: Radiation Oncology

## 2017-06-25 ENCOUNTER — Ambulatory Visit
Admission: RE | Admit: 2017-06-25 | Discharge: 2017-06-25 | Disposition: A | Discharge status: Home / Self Care | Payer: PRIVATE HEALTH INSURANCE | Source: Ambulatory Visit | Attending: Radiation Oncology | Admitting: Radiation Oncology

## 2017-06-28 ENCOUNTER — Ambulatory Visit
Admission: RE | Admit: 2017-06-28 | Discharge: 2017-06-28 | Disposition: A | Discharge status: Home / Self Care | Payer: PRIVATE HEALTH INSURANCE | Source: Ambulatory Visit | Attending: Radiation Oncology | Admitting: Radiation Oncology

## 2017-06-29 ENCOUNTER — Ambulatory Visit
Admission: RE | Admit: 2017-06-29 | Discharge: 2017-06-29 | Disposition: A | Discharge status: Home / Self Care | Payer: PRIVATE HEALTH INSURANCE | Source: Ambulatory Visit | Attending: Radiation Oncology | Admitting: Radiation Oncology

## 2017-06-30 ENCOUNTER — Ambulatory Visit
Admission: RE | Admit: 2017-06-30 | Discharge: 2017-06-30 | Disposition: A | Discharge status: Home / Self Care | Payer: PRIVATE HEALTH INSURANCE | Source: Ambulatory Visit | Attending: Radiation Oncology | Admitting: Radiation Oncology

## 2017-07-01 ENCOUNTER — Ambulatory Visit
Admission: RE | Admit: 2017-07-01 | Discharge: 2017-07-01 | Disposition: A | Discharge status: Home / Self Care | Payer: PRIVATE HEALTH INSURANCE | Source: Ambulatory Visit | Attending: Radiation Oncology | Admitting: Radiation Oncology

## 2017-07-02 ENCOUNTER — Ambulatory Visit (INDEPENDENT_AMBULATORY_CARE_PROVIDER_SITE_OTHER): Payer: Self-pay | Admitting: Family Medicine

## 2017-07-02 ENCOUNTER — Encounter: Payer: Self-pay | Admitting: Family Medicine

## 2017-07-02 ENCOUNTER — Ambulatory Visit
Admission: RE | Admit: 2017-07-02 | Discharge: 2017-07-02 | Disposition: A | Discharge status: Home / Self Care | Payer: PRIVATE HEALTH INSURANCE | Source: Ambulatory Visit | Attending: Radiation Oncology | Admitting: Radiation Oncology

## 2017-07-02 VITALS — BP 132/86 | HR 70 | Temp 98.2°F | Resp 16 | Ht 64.0 in | Wt 160.8 lb

## 2017-07-02 DIAGNOSIS — E119 Type 2 diabetes mellitus without complications: Secondary | ICD-10-CM

## 2017-07-02 DIAGNOSIS — C3412 Malignant neoplasm of upper lobe, left bronchus or lung: Secondary | ICD-10-CM

## 2017-07-02 DIAGNOSIS — E7800 Pure hypercholesterolemia, unspecified: Secondary | ICD-10-CM

## 2017-07-02 LAB — LIPID PANEL
Cholesterol: 133 mg/dL (ref <200)
HDL: 54 mg/dL (ref >40)
LDL Cholesterol (Calc): 65 mg/dL (calc)
NON-HDL CHOLESTEROL (CALC): 79 mg/dL (ref <130)
Total CHOL/HDL Ratio: 2.5 (calc) (ref <5.0)
Triglycerides: 67 mg/dL (ref <150)

## 2017-07-02 LAB — POCT GLYCOSYLATED HEMOGLOBIN (HGB A1C): HEMOGLOBIN A1C: 7.7

## 2017-07-02 MED ORDER — METFORMIN HCL 500 MG PO TABS: 500.0000 mg | ORAL_TABLET | Freq: Two times a day (BID) | ORAL | 0 refills | Status: DC

## 2017-07-02 MED ORDER — ATORVASTATIN CALCIUM 40 MG PO TABS: 40.0000 mg | ORAL_TABLET | Freq: Every day | ORAL | 0 refills | Status: DC

## 2017-07-02 NOTE — Progress Notes (Signed)
Name: Melvin Willis   MRN: [id number]    DOB: Jan 07, 1951   Date:07/02/2017       Progress Note  Subjective  Chief Complaint  Chief Complaint  Patient presents with  . Hyperlipidemia    f/u with fasting labs  . Medication Refill    lipitor  . Diabetes    f/u    Hyperlipidemia  This is a chronic problem. The problem is controlled. Recent lipid tests were reviewed and are normal. Pertinent negatives include no chest pain, leg pain, myalgias or shortness of breath. Current antihyperlipidemic treatment includes statins. The current treatment provides significant improvement of lipids.  Diabetes  He presents for his follow-up diabetic visit. He has type 2 diabetes mellitus. His disease course has been worsening. Hypoglycemia symptoms include nervousness/anxiousness and sweats. Pertinent negatives for hypoglycemia include no dizziness. Associated symptoms include fatigue and polyuria. Pertinent negatives for diabetes include no chest pain, no foot paresthesias and no polydipsia. There are no hypoglycemic complications. Pertinent negatives for diabetic complications include no CVA, heart disease or peripheral neuropathy. Current diabetic treatment includes oral agent (dual therapy) (he takes Glipizide if his sugar is too high after chemoRx and Radiation.). His weight is stable. He is following a generally healthy diet. He rarely participates in exercise. He monitors blood glucose at home 1-2 x per day. His breakfast blood glucose range is generally 140-180 mg/dl.      Past Medical History:  Diagnosis Date  . Abnormal prostate specific antigen 08/08/2012  . Adiposity 04/16/2015  . Chronic kidney disease (CKD), stage III (moderate) ([place]) 11/27/2016  . CVA (cerebral vascular accident) ([place]) 06/17/2016  . Diabetes mellitus without complication ([place])   . Diverticulosis of sigmoid colon 04/16/2015  . Dyslipidemia 03/18/2015  . Hemorrhoids, internal 04/16/2015  . Hypercholesteremia 04/16/2015  .  Hyperlipidemia   . Hypertension   . Primary cancer of left upper lobe of lung ([place])   . Pulmonary embolism ([place])   . Wears dentures    partial upper    Past Surgical History:  Procedure Laterality Date  . COLONOSCOPY    . COLONOSCOPY WITH PROPOFOL N/A 05/06/2015   Procedure: COLONOSCOPY WITH PROPOFOL;  Surgeon: Lucilla Lame, MD;  Location: [place];  Service: Endoscopy;  Laterality: N/A;  ASCENDING COLON POLYPS X 2 TERMINAL ILEUM BIOPSY RANDOM COLON BX. TRANSVERSE COLON POLYP SIGMOID COLON POLYP  . ESOPHAGOGASTRODUODENOSCOPY (EGD) WITH PROPOFOL N/A 05/06/2015   Procedure: ESOPHAGOGASTRODUODENOSCOPY (EGD) WITH PROPOFOL;  Surgeon: Lucilla Lame, MD;  Location: [place];  Service: Endoscopy;  Laterality: N/A;  GASTRIC BIOPSY X1    Family History  Problem Relation Age of Onset  . Diabetes Mother   . Diabetes Father   . CAD Father   . Dementia Father   . Diabetes Sister   . Cancer Maternal Uncle        Prostate  . Cancer Cousin        prostate    Social History   Social History  . Marital status: Married    Spouse name: N/A  . Number of children: N/A  . Years of education: N/A   Occupational History  . Not on file.   Social History Main Topics  . Smoking status: Never Smoker  . Smokeless tobacco: Never Used  . Alcohol use No  . Drug use: No  . Sexual activity: Yes    Partners: Female   Other Topics Concern  . Not on file   Social History Narrative  . No narrative on  file     Current Outpatient Prescriptions:  .  atorvastatin (LIPITOR) 40 MG tablet, Take 1 tablet (40 mg total) by mouth daily at 6 PM., Disp: 90 tablet, Rfl: 0 .  finasteride (PROSCAR) 5 MG tablet, Take 1 tablet (5 mg total) by mouth at bedtime., Disp: 14 tablet, Rfl: 0 .  Loperamide HCl (IMODIUM A-D PO), Take 1 tablet by mouth as needed (loose stool/diarrhea)., Disp: , Rfl:  .  metFORMIN (GLUCOPHAGE) 500 MG tablet, Take 1 tablet (500 mg total) by mouth 2 (two) times daily with  a meal., Disp: 180 tablet, Rfl: 0 .  rivaroxaban (XARELTO) 20 MG TABS tablet, Take 1 tablet (20 mg total) by mouth daily with supper., Disp: 30 tablet, Rfl: 11 .  sucralfate (CARAFATE) 1 g tablet, Take 1 tablet (1 g total) by mouth 3 (three) times daily. Dissolve in 2-3 tbsp warm water, swish and swallow., Disp: 90 tablet, Rfl: 3 .  tamsulosin (FLOMAX) 0.4 MG CAPS capsule, Take 0.4 mg by mouth every morning. , Disp: , Rfl:  .  glipiZIDE (GLUCOTROL) 5 MG tablet, Take 5 mg by mouth daily before breakfast., Disp: , Rfl:  .  ondansetron (ZOFRAN) 8 MG tablet, Take 1 tablet (8 mg total) by mouth every 8 (eight) hours as needed for nausea or vomiting (start 3 days; after chemo). (Patient not taking: Reported on 05/03/2017), Disp: 40 tablet, Rfl: 1 .  prochlorperazine (COMPAZINE) 10 MG tablet, Take 1 tablet (10 mg total) by mouth every 6 (six) hours as needed for nausea or vomiting. (Patient not taking: Reported on 05/03/2017), Disp: 40 tablet, Rfl: 1  No Known Allergies   Review of Systems  Constitutional: Positive for fatigue.  Respiratory: Negative for shortness of breath.   Cardiovascular: Negative for chest pain.  Musculoskeletal: Negative for myalgias.  Neurological: Negative for dizziness.  Endo/Heme/Allergies: Negative for polydipsia.  Psychiatric/Behavioral: The patient is nervous/anxious.       Objective  Vitals:   07/02/17 0932  BP: 132/86  Pulse: 70  Resp: 16  Temp: 98.2 F (36.8 C)  TempSrc: Oral  SpO2: 98%  Weight: 160 lb 12.8 oz (72.9 kg)  Height: 5\' 4"  (1.626 m)    Physical Exam  Constitutional: He is oriented to person, place, and time and well-developed, well-nourished, and in no distress.  HENT:  Head: Normocephalic and atraumatic.  Cardiovascular: Normal rate, regular rhythm and normal heart sounds.   No murmur heard. Pulmonary/Chest: Effort normal and breath sounds normal. He has no wheezes.  Abdominal: Soft. Bowel sounds are normal. There is no tenderness.   Neurological: He is alert and oriented to person, place, and time.  Psychiatric: Mood, memory, affect and judgment normal.  Nursing note and vitals reviewed.    Assessment & Plan  1. Hypercholesteremia  FLP at goal from April 2018 - atorvastatin (LIPITOR) 40 MG tablet; Take 1 tablet (40 mg total) by mouth daily at 6 PM.  Dispense: 90 tablet; Refill: 0 - Lipid panel  2. Primary cancer of left upper lobe of lung [facility]) Now undergoing radiation treatment, being followed by oncology, records reviewed  3. Type 2 diabetes mellitus without complication, without long-term current use of insulin ([facility]) Point-of-care A1c 7.7%, advised to increase metformin to 1000 mg twice a day if his blood glucose stays persistently elevated, avoid glipizide as it may cause hypoglycemic episodes, continue to eat 3 meals a day - POCT HgB A1C - metFORMIN (GLUCOPHAGE) 500 MG tablet; Take 1 tablet (500 mg total) by mouth 2 (two)  times daily with a meal.  Dispense: 180 tablet; Refill: 0  Aleathea Pugmire Asad A. [facility] 07/02/2017 9:43 AM

## 2017-07-05 ENCOUNTER — Ambulatory Visit
Admission: RE | Admit: 2017-07-05 | Discharge: 2017-07-05 | Disposition: A | Discharge status: Home / Self Care | Payer: PRIVATE HEALTH INSURANCE | Source: Ambulatory Visit | Attending: Radiation Oncology | Admitting: Radiation Oncology

## 2017-07-06 ENCOUNTER — Ambulatory Visit
Admission: RE | Admit: 2017-07-06 | Discharge: 2017-07-06 | Disposition: A | Discharge status: Home / Self Care | Payer: PRIVATE HEALTH INSURANCE | Source: Ambulatory Visit | Attending: Radiation Oncology | Admitting: Radiation Oncology

## 2017-07-07 ENCOUNTER — Ambulatory Visit
Admission: RE | Admit: 2017-07-07 | Discharge: 2017-07-07 | Disposition: A | Discharge status: Home / Self Care | Payer: PRIVATE HEALTH INSURANCE | Source: Ambulatory Visit | Attending: Radiation Oncology | Admitting: Radiation Oncology

## 2017-07-08 ENCOUNTER — Ambulatory Visit
Admission: RE | Admit: 2017-07-08 | Discharge: 2017-07-08 | Disposition: A | Discharge status: Home / Self Care | Payer: PRIVATE HEALTH INSURANCE | Source: Ambulatory Visit | Attending: Radiation Oncology | Admitting: Radiation Oncology

## 2017-07-09 ENCOUNTER — Ambulatory Visit
Admission: RE | Admit: 2017-07-09 | Discharge: 2017-07-09 | Disposition: A | Discharge status: Home / Self Care | Payer: PRIVATE HEALTH INSURANCE | Source: Ambulatory Visit | Attending: Radiation Oncology | Admitting: Radiation Oncology

## 2017-07-12 ENCOUNTER — Ambulatory Visit
Admission: RE | Admit: 2017-07-12 | Discharge: 2017-07-12 | Disposition: A | Discharge status: Home / Self Care | Payer: PRIVATE HEALTH INSURANCE | Source: Ambulatory Visit | Attending: Radiation Oncology | Admitting: Radiation Oncology

## 2017-07-19 ENCOUNTER — Encounter: Payer: Self-pay | Admitting: Internal Medicine

## 2017-07-19 ENCOUNTER — Inpatient Hospital Stay: Payer: Self-pay | Attending: Internal Medicine

## 2017-07-19 ENCOUNTER — Inpatient Hospital Stay (HOSPITAL_BASED_OUTPATIENT_CLINIC_OR_DEPARTMENT_OTHER): Payer: Self-pay | Admitting: Internal Medicine

## 2017-07-19 VITALS — BP 129/80 | HR 59 | Temp 97.6°F | Resp 14 | Wt 163.4 lb

## 2017-07-19 DIAGNOSIS — C3412 Malignant neoplasm of upper lobe, left bronchus or lung: Secondary | ICD-10-CM | POA: Insufficient documentation

## 2017-07-19 DIAGNOSIS — E785 Hyperlipidemia, unspecified: Secondary | ICD-10-CM

## 2017-07-19 DIAGNOSIS — N183 Chronic kidney disease, stage 3 (moderate): Secondary | ICD-10-CM | POA: Insufficient documentation

## 2017-07-19 DIAGNOSIS — I129 Hypertensive chronic kidney disease with stage 1 through stage 4 chronic kidney disease, or unspecified chronic kidney disease: Secondary | ICD-10-CM | POA: Insufficient documentation

## 2017-07-19 DIAGNOSIS — E7800 Pure hypercholesterolemia, unspecified: Secondary | ICD-10-CM

## 2017-07-19 DIAGNOSIS — Z8673 Personal history of transient ischemic attack (TIA), and cerebral infarction without residual deficits: Secondary | ICD-10-CM | POA: Insufficient documentation

## 2017-07-19 DIAGNOSIS — Z8719 Personal history of other diseases of the digestive system: Secondary | ICD-10-CM

## 2017-07-19 DIAGNOSIS — Z79899 Other long term (current) drug therapy: Secondary | ICD-10-CM | POA: Insufficient documentation

## 2017-07-19 DIAGNOSIS — Z9221 Personal history of antineoplastic chemotherapy: Secondary | ICD-10-CM | POA: Insufficient documentation

## 2017-07-19 DIAGNOSIS — E119 Type 2 diabetes mellitus without complications: Secondary | ICD-10-CM

## 2017-07-19 DIAGNOSIS — M7989 Other specified soft tissue disorders: Secondary | ICD-10-CM

## 2017-07-19 DIAGNOSIS — Z86711 Personal history of pulmonary embolism: Secondary | ICD-10-CM

## 2017-07-19 DIAGNOSIS — Z86718 Personal history of other venous thrombosis and embolism: Secondary | ICD-10-CM | POA: Insufficient documentation

## 2017-07-19 DIAGNOSIS — Z7984 Long term (current) use of oral hypoglycemic drugs: Secondary | ICD-10-CM | POA: Insufficient documentation

## 2017-07-19 DIAGNOSIS — Z7901 Long term (current) use of anticoagulants: Secondary | ICD-10-CM | POA: Insufficient documentation

## 2017-07-19 DIAGNOSIS — Z923 Personal history of irradiation: Secondary | ICD-10-CM | POA: Insufficient documentation

## 2017-07-19 DIAGNOSIS — Z8042 Family history of malignant neoplasm of prostate: Secondary | ICD-10-CM | POA: Insufficient documentation

## 2017-07-19 LAB — COMPREHENSIVE METABOLIC PANEL
ALT: 34 U/L (ref 17–63)
AST: 25 U/L (ref 15–41)
Albumin: 3.2 g/dL — ABNORMAL LOW (ref 3.5–5.0)
Alkaline Phosphatase: 102 U/L (ref 38–126)
Anion gap: 8 (ref 5–15)
BILIRUBIN TOTAL: 0.5 mg/dL (ref 0.3–1.2)
BUN: 14 mg/dL (ref 6–20)
CO2: 22 mmol/L (ref 22–32)
CREATININE: 1.28 mg/dL — AB (ref 0.61–1.24)
Calcium: 8.8 mg/dL — ABNORMAL LOW (ref 8.9–10.3)
Chloride: 102 mmol/L (ref 101–111)
GFR calc Af Amer: >60 mL/min (ref >60)
GFR, EST NON AFRICAN AMERICAN: 58 mL/min — AB (ref >60)
Glucose, Bld: 169 mg/dL — ABNORMAL HIGH (ref 65–99)
Potassium: 4.6 mmol/L (ref 3.5–5.1)
Sodium: 132 mmol/L — ABNORMAL LOW (ref 135–145)
TOTAL PROTEIN: 6.7 g/dL (ref 6.5–8.1)

## 2017-07-19 LAB — CBC WITH DIFFERENTIAL/PLATELET
BASOS ABS: 0 10*3/uL (ref 0–0.1)
Basophils Relative: 1 %
Eosinophils Absolute: 0.2 10*3/uL (ref 0–0.7)
Eosinophils Relative: 5 %
HEMATOCRIT: 38.3 % — AB (ref 40.0–52.0)
HEMOGLOBIN: 12.8 g/dL — AB (ref 13.0–18.0)
Lymphocytes Relative: 13 %
Lymphs Abs: 0.6 10*3/uL — ABNORMAL LOW (ref 1.0–3.6)
MCH: 29 pg (ref 26.0–34.0)
MCHC: 33.5 g/dL (ref 32.0–36.0)
MCV: 86.8 fL (ref 80.0–100.0)
Monocytes Absolute: 1.3 10*3/uL — ABNORMAL HIGH (ref 0.2–1.0)
Monocytes Relative: 28 %
NEUTROS ABS: 2.5 10*3/uL (ref 1.4–6.5)
Neutrophils Relative %: 55 %
Platelets: 270 10*3/uL (ref 150–440)
RBC: 4.42 MIL/uL (ref 4.40–5.90)
RDW: 17.3 % — ABNORMAL HIGH (ref 11.5–14.5)
WBC: 4.6 10*3/uL (ref 3.8–10.6)

## 2017-07-19 NOTE — Progress Notes (Signed)
[place] NOTE  Patient Care Team: Roselee Nova, MD as PCP - General (Family Medicine)  CHIEF COMPLAINTS/PURPOSE OF CONSULTATION:  Lung cancer  #  Oncology History   # OCT 2017- ADENO CA LUNG; STAGE IV [; LUL; bil supraclavicular LN; Left neck LN Bx]; ROS-1 MUTATED; s/p Carbo-alimta x1[oct 2017]  # NOV 1st 2017- XALKORI 250 mg BID; JAN 15th CT- PR;  # AUG 27th Chemo-RT to persistent LUL/mediastinal LN  # LLE DVT/bil PE/Multiple strokes [? On xarelto]-Lovenox; Jan mid 2018- xarelto [lovenox-insurance issues]  # MRI brain- multiple infarcts [2d echo/bubble study-NEG's/p Neurology eval]  # # s/p TURP [Sep 2017; Dr.Cope] DEC 26th CT- distended bladder   # MOLECULAR TESTING- ROS-1 POSITIVE; ALK/EGFR-NEG; PDL-1 EXPRESSION- 90%** [HIGH]     Primary cancer of left upper lobe of lung ([place])     HISTORY OF PRESENTING ILLNESS:  Melvin Willis 66 y.o.  male metastatic adenocarcinoma of the lung ROS- positive- currently s/p 5 cycles carbo-taxol with RT for residual/persistent Left upper lung mass/atelectasis.  Patient is restarted back on crizotinib at this time.  He denies any unusual shortness of breath or cough.  He denies any fevers or chills.  Continues to have chronic swelling in the legs.  Denies any headaches.  Denies any vision changes.  ROS: A complete 10 point review of system is done which is negative except mentioned above in history of present illness  MEDICAL HISTORY:  Past Medical History:  Diagnosis Date  . Abnormal prostate specific antigen 08/08/2012  . Adiposity 04/16/2015  . Chronic kidney disease (CKD), stage III (moderate) ([place]) 11/27/2016  . CVA (cerebral vascular accident) ([place]) 06/17/2016  . Diabetes mellitus without complication ([place])   . Diverticulosis of sigmoid colon 04/16/2015  . Dyslipidemia 03/18/2015  . Hemorrhoids, internal 04/16/2015  . Hypercholesteremia 04/16/2015  . Hyperlipidemia   . Hypertension   . Primary cancer  of left upper lobe of lung ([place])   . Pulmonary embolism ([place])   . Wears dentures    partial upper    SURGICAL HISTORY: Past Surgical History:  Procedure Laterality Date  . COLONOSCOPY      SOCIAL HISTORY: Social History   Socioeconomic History  . Marital status: Married    Spouse name: Not on file  . Number of children: Not on file  . Years of education: Not on file  . Highest education level: Not on file  Social Needs  . Financial resource strain: Not on file  . Food insecurity - worry: Not on file  . Food insecurity - inability: Not on file  . Transportation needs - medical: Not on file  . Transportation needs - non-medical: Not on file  Occupational History  . Not on file  Tobacco Use  . Smoking status: Never Smoker  . Smokeless tobacco: Never Used  Substance and Sexual Activity  . Alcohol use: No    Alcohol/week: 0.0 oz  . Drug use: No  . Sexual activity: Yes    Partners: Female  Other Topics Concern  . Not on file  Social History Narrative  . Not on file    FAMILY HISTORY: Family History  Problem Relation Age of Onset  . Diabetes Mother   . Diabetes Father   . CAD Father   . Dementia Father   . Diabetes Sister   . Cancer Maternal Uncle        Prostate  . Cancer Cousin        prostate  ALLERGIES:  has No Known Allergies.  MEDICATIONS:  Current Outpatient Medications  Medication Sig Dispense Refill  . atorvastatin (LIPITOR) 40 MG tablet Take 1 tablet (40 mg total) by mouth daily at 6 PM. 90 tablet 0  . finasteride (PROSCAR) 5 MG tablet Take 1 tablet (5 mg total) by mouth at bedtime. 14 tablet 0  . glipiZIDE (GLUCOTROL) 5 MG tablet Take 5 mg by mouth daily before breakfast.    . Loperamide HCl (IMODIUM A-D PO) Take 1 tablet by mouth as needed (loose stool/diarrhea).    . metFORMIN (GLUCOPHAGE) 500 MG tablet Take 1 tablet (500 mg total) by mouth 2 (two) times daily with a meal. 180 tablet 0  . ondansetron (ZOFRAN) 8 MG tablet Take 1 tablet (8 mg  total) by mouth every 8 (eight) hours as needed for nausea or vomiting (start 3 days; after chemo). 40 tablet 1  . prochlorperazine (COMPAZINE) 10 MG tablet Take 1 tablet (10 mg total) by mouth every 6 (six) hours as needed for nausea or vomiting. 40 tablet 1  . rivaroxaban (XARELTO) 20 MG TABS tablet Take 1 tablet (20 mg total) by mouth daily with supper. 30 tablet 11  . sucralfate (CARAFATE) 1 g tablet Take 1 tablet (1 g total) by mouth 3 (three) times daily. Dissolve in 2-3 tbsp warm water, swish and swallow. 90 tablet 3  . tamsulosin (FLOMAX) 0.4 MG CAPS capsule Take 0.4 mg by mouth every morning.      No current facility-administered medications for this visit.      PHYSICAL EXAMINATION: ECOG PERFORMANCE STATUS: 1 - Symptomatic but completely ambulatory  Vitals:   07/19/17 1149  BP: 129/80  Pulse: (!) 59  Resp: 14  Temp: 97.6 F (36.4 C)   Filed Weights   07/19/17 1149  Weight: 163 lb 6.4 oz (74.1 kg)    GENERAL: Well-nourished well-developed; Alert, no distress and comfortable. He is alone.  EYES: no pallor or icterus OROPHARYNX: no thrush or ulceration; good dentition   NECK: supple, no masses felt LYMPH:  no palpable lymphadenopathy in the cervical, axillary or inguinal regions LUNGS: clear to auscultation and  No wheeze or crackles HEART/CVS: regular rate & rhythm and no murmurs; No lower extremity edema. Compression socks in place. ABDOMEN: abdomen soft, non-tender and normal bowel sounds MSK:no cyanosis of digits and no clubbing  PSYCH: alert & oriented x 3 with fluent speech NEURO: no focal motor/sensory deficits SKIN:  no rashes or significant lesions  LABORATORY DATA:  I have reviewed the data as listed Lab Results  Component Value Date   WBC 4.6 07/19/2017   HGB 12.8 (L) 07/19/2017   HCT 38.3 (L) 07/19/2017   MCV 86.8 07/19/2017   PLT 270 07/19/2017   Recent Labs    05/31/17 0904 06/21/17 1040 07/19/17 1123  NA 137 134* 132*  K 3.6 3.6 4.6  CL  106 103 102  CO2 25 20* 22  GLUCOSE 93 165* 169*  BUN '10 11 14  ' CREATININE 1.04 1.13 1.28*  CALCIUM 9.1 9.2 8.8*  GFRNONAA >60 >60 58*  GFRAA >60 >60 >60  PROT 6.7 7.2 6.7  ALBUMIN 3.8 3.8 3.2*  AST '18 19 25  ' ALT 29 24 34  ALKPHOS 73 87 102  BILITOT 0.6 0.6 0.5    RADIOGRAPHIC STUDIES: I have personally reviewed the radiological images as listed and agreed with the findings in the report. No results found. IMPRESSION: 4.3 x 2.5 cm mass-like opacity in the medial left upper lobe,  corresponding to the patient's known primary bronchogenic neoplasm grossly unchanged.  Associated prevascular lymphadenopathy measuring up to 14 mm short axis, unchanged.   Electronically Signed   By: Julian Hy M.D.   On: 03/22/2017 12:55  IMPRESSION: 1. Metabolic activity associated with persistently enlarged prevascular lymph nodes is concerning for residual carcinoma. Size stability raise the possibility of inflammatory lymph nodes. 2. No significant activity associated with consolidated lung along the LEFT upper mediastinum consistent with treated tumor / lung. 3. No new or distant metastatic disease.   Electronically Signed   By: Suzy Bouchard M.D.   On: 04/07/2017 15:03  ASSESSMENT & PLAN:   Primary cancer of left upper lobe of lung ([place]) # Adenocarcinoma of the lung metastatic/stage IV- bilateral supraclavicular adenopathy; ROS-1 POSITIVE. AUG 1st 2018- increased uptake in the prevascular lymph nodes; no significant uptake left upper medial lobe lung lesion; no distant metastasis. s/p Currently on carbotaxol weekly along with radiation to the area of residual disease [until sep 27th].  # Currently re-started back on Crizotinib 250 mg/day. Continue the same.  Tolerating well without any significant side effects.  # CKD- multifactorial [prostatitic obstruction/diabetes]-  Creat ~1.2/ monitor for now. Drink fluids peri-CT scan.   # Bil LE swelling CKD/  Crizotinib-STABLE. Continue compression socks.  # Bilateral PE & left lower extremity DVT- improved. continues Xarelto.   #  RTC in 5 weeks for labs and follow up/CT scan few days prior.      Cammie Sickle, MD 07/19/2017 12:16 PM

## 2017-07-19 NOTE — Assessment & Plan Note (Addendum)
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 AUG 1st 2018- increased uptake in the prevascular lymph nodes; no significant uptake left upper medial lobe lung lesion; no distant metastasis. s/p Currently on carbotaxol weekly along with radiation to the area of residual disease [until sep 27th].  # Currently re-started back on Crizotinib 250 mg/day. Continue the same.  Tolerating well without any significant side effects.  # CKD- multifactorial [prostatitic obstruction/diabetes]-  Creat ~1.2/ monitor for now. Drink fluids peri-CT scan.   # Bil LE swelling CKD/ Crizotinib-STABLE. Continue compression socks.  # Bilateral PE & left lower extremity DVT- improved. continues Xarelto.   #  RTC in 5 weeks for labs and follow up/CT scan few days prior.

## 2017-08-17 ENCOUNTER — Other Ambulatory Visit: Payer: Self-pay | Admitting: Family Medicine

## 2017-08-17 DIAGNOSIS — E119 Type 2 diabetes mellitus without complications: Secondary | ICD-10-CM

## 2017-08-17 MED ORDER — METFORMIN HCL 500 MG PO TABS: 500.0000 mg | ORAL_TABLET | Freq: Two times a day (BID) | ORAL | 0 refills | Status: DC

## 2017-08-17 NOTE — Telephone Encounter (Signed)
Refill request for diabetic medication:   Metformin 500 mg  Last office visit pertaining to diabetes: 07/02/2017  Lab Results  Component Value Date   HGBA1C 7.7 07/02/2017   Follow up visit: 10/01/2017

## 2017-08-19 ENCOUNTER — Encounter: Payer: Self-pay | Admitting: Radiation Oncology

## 2017-08-19 ENCOUNTER — Other Ambulatory Visit: Payer: Self-pay

## 2017-08-19 ENCOUNTER — Ambulatory Visit
Admission: RE | Admit: 2017-08-19 | Discharge: 2017-08-19 | Disposition: A | Discharge status: Home / Self Care | Payer: Self-pay | Source: Ambulatory Visit | Attending: Radiation Oncology | Admitting: Radiation Oncology

## 2017-08-19 VITALS — BP 141/74 | HR 62 | Temp 96.4°F | Resp 20 | Wt 160.1 lb

## 2017-08-19 DIAGNOSIS — C779 Secondary and unspecified malignant neoplasm of lymph node, unspecified: Secondary | ICD-10-CM | POA: Insufficient documentation

## 2017-08-19 DIAGNOSIS — Z923 Personal history of irradiation: Secondary | ICD-10-CM | POA: Insufficient documentation

## 2017-08-19 DIAGNOSIS — C3412 Malignant neoplasm of upper lobe, left bronchus or lung: Secondary | ICD-10-CM | POA: Insufficient documentation

## 2017-08-19 NOTE — Progress Notes (Signed)
Radiation Oncology Follow up Note  Name: Melvin Willis   Date:   08/19/2017 MRN:  [id number] DOB: Nov 06, 1950    This 66 y.o. male presents to the clinic today for one-month follow-up status post concurrent chemoradiation in a split course fashion for stage IV adenocarcinoma of the lung.  REFERRING PROVIDER: Roselee Nova, MD  HPI: Patient is a 66 year old male now out 1 month having completed radiation therapy to his chest for stage IV adenocarcinoma the left upper lobe. He did have bilateral supra clavicular involvement. He is seen today in routine follow-up and is doing well. He specifically denies cough hemoptysis or chest tightness. He is scheduled for repeat CT scan tomorrow..  COMPLICATIONS OF TREATMENT: none  FOLLOW UP COMPLIANCE: keeps appointments   PHYSICAL EXAM:  BP (!) 141/74   Pulse 62   Temp (!) 96.4 F (35.8 C)   Resp 20   Wt 160 lb 0.9 oz (72.6 kg)   BMI 27.47 kg/m  Well-developed well-nourished patient in NAD. HEENT reveals PERLA, EOMI, discs not visualized.  Oral cavity is clear. No oral mucosal lesions are identified. Neck is clear without evidence of cervical or supraclavicular adenopathy. Lungs are clear to A&P. Cardiac examination is essentially unremarkable with regular rate and rhythm without murmur rub or thrill. Abdomen is benign with no organomegaly or masses noted. Motor sensory and DTR levels are equal and symmetric in the upper and lower extremities. Cranial nerves II through XII are grossly intact. Proprioception is intact. No peripheral adenopathy or edema is identified. No motor or sensory levels are noted. Crude visual fields are within normal range.  RADIOLOGY RESULTS: Will review his CT scans from tomorrow  PLAN: Present time patient is clinically doing well. I anticipate reviewing his CT scan which will be performed tomorrow. Otherwise he continues close follow-up care with medical oncology and may undergo further treatment either further  chemotherapy or immunotherapy. I have asked to see him back in 3-4 months for follow-up. Patient is to call sooner with any concerns.  I would like to take this opportunity to thank you for allowing me to participate in the care of your patient.Noreene Filbert, MD

## 2017-08-20 ENCOUNTER — Ambulatory Visit
Admission: RE | Admit: 2017-08-20 | Discharge: 2017-08-20 | Disposition: A | Discharge status: Home / Self Care | Payer: Self-pay | Source: Ambulatory Visit | Attending: Internal Medicine | Admitting: Internal Medicine

## 2017-08-20 DIAGNOSIS — C3412 Malignant neoplasm of upper lobe, left bronchus or lung: Secondary | ICD-10-CM | POA: Insufficient documentation

## 2017-08-20 DIAGNOSIS — R590 Localized enlarged lymph nodes: Secondary | ICD-10-CM | POA: Insufficient documentation

## 2017-08-20 DIAGNOSIS — Z9889 Other specified postprocedural states: Secondary | ICD-10-CM | POA: Insufficient documentation

## 2017-08-20 MED ORDER — IOPAMIDOL (ISOVUE-300) INJECTION 61%
75.0000 mL | Freq: Once | INTRAVENOUS | Status: AC | PRN
  Administered 2017-08-20: 75 mL via INTRAVENOUS

## 2017-08-25 ENCOUNTER — Inpatient Hospital Stay: Payer: Self-pay | Attending: Internal Medicine

## 2017-08-25 ENCOUNTER — Other Ambulatory Visit: Payer: Self-pay

## 2017-08-25 ENCOUNTER — Inpatient Hospital Stay (HOSPITAL_BASED_OUTPATIENT_CLINIC_OR_DEPARTMENT_OTHER): Payer: Self-pay | Admitting: Internal Medicine

## 2017-08-25 VITALS — BP 154/90 | HR 74 | Temp 97.0°F | Resp 20 | Ht 64.0 in | Wt 161.5 lb

## 2017-08-25 DIAGNOSIS — M7989 Other specified soft tissue disorders: Secondary | ICD-10-CM

## 2017-08-25 DIAGNOSIS — Z79899 Other long term (current) drug therapy: Secondary | ICD-10-CM | POA: Insufficient documentation

## 2017-08-25 DIAGNOSIS — C3412 Malignant neoplasm of upper lobe, left bronchus or lung: Secondary | ICD-10-CM | POA: Insufficient documentation

## 2017-08-25 DIAGNOSIS — N183 Chronic kidney disease, stage 3 (moderate): Secondary | ICD-10-CM

## 2017-08-25 DIAGNOSIS — Z7984 Long term (current) use of oral hypoglycemic drugs: Secondary | ICD-10-CM | POA: Insufficient documentation

## 2017-08-25 DIAGNOSIS — Z8673 Personal history of transient ischemic attack (TIA), and cerebral infarction without residual deficits: Secondary | ICD-10-CM

## 2017-08-25 DIAGNOSIS — Z7901 Long term (current) use of anticoagulants: Secondary | ICD-10-CM

## 2017-08-25 DIAGNOSIS — Z9221 Personal history of antineoplastic chemotherapy: Secondary | ICD-10-CM

## 2017-08-25 DIAGNOSIS — Z923 Personal history of irradiation: Secondary | ICD-10-CM

## 2017-08-25 DIAGNOSIS — E7800 Pure hypercholesterolemia, unspecified: Secondary | ICD-10-CM

## 2017-08-25 DIAGNOSIS — E785 Hyperlipidemia, unspecified: Secondary | ICD-10-CM

## 2017-08-25 DIAGNOSIS — Z8042 Family history of malignant neoplasm of prostate: Secondary | ICD-10-CM | POA: Insufficient documentation

## 2017-08-25 DIAGNOSIS — I10 Essential (primary) hypertension: Secondary | ICD-10-CM

## 2017-08-25 DIAGNOSIS — Z86718 Personal history of other venous thrombosis and embolism: Secondary | ICD-10-CM | POA: Insufficient documentation

## 2017-08-25 DIAGNOSIS — C779 Secondary and unspecified malignant neoplasm of lymph node, unspecified: Secondary | ICD-10-CM

## 2017-08-25 DIAGNOSIS — E119 Type 2 diabetes mellitus without complications: Secondary | ICD-10-CM | POA: Insufficient documentation

## 2017-08-25 LAB — CBC WITH DIFFERENTIAL/PLATELET
BASOS ABS: 0 10*3/uL (ref 0–0.1)
BASOS PCT: 1 %
EOS ABS: 0.2 10*3/uL (ref 0–0.7)
Eosinophils Relative: 5 %
HEMATOCRIT: 40.8 % (ref 40.0–52.0)
HEMOGLOBIN: 13.3 g/dL (ref 13.0–18.0)
Lymphocytes Relative: 26 %
Lymphs Abs: 1.1 10*3/uL (ref 1.0–3.6)
MCH: 27.8 pg (ref 26.0–34.0)
MCHC: 32.6 g/dL (ref 32.0–36.0)
MCV: 85.4 fL (ref 80.0–100.0)
Monocytes Absolute: 0.9 10*3/uL (ref 0.2–1.0)
Monocytes Relative: 21 %
NEUTROS ABS: 2 10*3/uL (ref 1.4–6.5)
NEUTROS PCT: 47 %
Platelets: 376 10*3/uL (ref 150–440)
RBC: 4.78 MIL/uL (ref 4.40–5.90)
RDW: 16.4 % — ABNORMAL HIGH (ref 11.5–14.5)
WBC: 4.1 10*3/uL (ref 3.8–10.6)

## 2017-08-25 LAB — COMPREHENSIVE METABOLIC PANEL
ALK PHOS: 104 U/L (ref 38–126)
ALT: 39 U/L (ref 17–63)
ANION GAP: 9 (ref 5–15)
AST: 28 U/L (ref 15–41)
Albumin: 3.3 g/dL — ABNORMAL LOW (ref 3.5–5.0)
BILIRUBIN TOTAL: 0.6 mg/dL (ref 0.3–1.2)
BUN: 15 mg/dL (ref 6–20)
CALCIUM: 9.1 mg/dL (ref 8.9–10.3)
CO2: 22 mmol/L (ref 22–32)
CREATININE: 1.38 mg/dL — AB (ref 0.61–1.24)
Chloride: 103 mmol/L (ref 101–111)
GFR, EST NON AFRICAN AMERICAN: 53 mL/min — AB (ref >60)
Glucose, Bld: 167 mg/dL — ABNORMAL HIGH (ref 65–99)
Potassium: 4.2 mmol/L (ref 3.5–5.1)
SODIUM: 134 mmol/L — AB (ref 135–145)
TOTAL PROTEIN: 7.4 g/dL (ref 6.5–8.1)

## 2017-08-25 NOTE — Progress Notes (Signed)
[name] NOTE  Patient Care Team: Roselee Nova, MD as PCP - General (Family Medicine)  CHIEF COMPLAINTS/PURPOSE OF CONSULTATION:  Lung cancer  #  Oncology History   # OCT 2017- ADENO CA LUNG; STAGE IV [; LUL; bil supraclavicular LN; Left neck LN Bx]; ROS-1 MUTATED; s/p Carbo-alimta x1[oct 2017]  # NOV 1st 2017- XALKORI 250 mg BID; JAN 15th CT- PR;  # AUG 27th Chemo-RT to persistent LUL/mediastinal LN [s/p carbo-taxol- with RT; finished Sep 22nd 2018]  # LLE DVT/bil PE/Multiple strokes [? On xarelto]-Lovenox; Jan mid 2018- xarelto [lovenox-insurance issues]  # MRI brain- multiple infarcts [2d echo/bubble study-NEG's/p Neurology eval]  # # s/p TURP [Sep 2017; Dr.Cope] DEC 26th CT- distended bladder   # MOLECULAR TESTING- ROS-1 POSITIVE; ALK/EGFR-NEG; PDL-1 EXPRESSION- 90%** [HIGH]     Primary cancer of left upper lobe of lung ([place])     HISTORY OF PRESENTING ILLNESS:  Melvin Willis 66 y.o.  male metastatic adenocarcinoma of the lung ROS- positive; currently on crizotinib is here for follow-up.  Patient had radiation approximately 2 months ago to the left upper lobe; is here to review the results of the restaging CAT scans.  In the interim he was evaluated by radiation oncology.  He had one episode of cough and shortness of breath approximately 1 week ago.  No fever or chills.  Currently resolved.  He currently denies any unusual shortness of breath or cough.  He denies any fevers or chills.  Continues to have chronic swelling in the legs.  Denies any headaches.  Denies any vision changes.  ROS: A complete 10 point review of system is done which is negative except mentioned above in history of present illness  MEDICAL HISTORY:  Past Medical History:  Diagnosis Date  . Abnormal prostate specific antigen 08/08/2012  . Adiposity 04/16/2015  . Chronic kidney disease (CKD), stage III (moderate) ([place]) 11/27/2016  . CVA (cerebral vascular accident)  ([place]) 06/17/2016  . Diabetes mellitus without complication ([place])   . Diverticulosis of sigmoid colon 04/16/2015  . Dyslipidemia 03/18/2015  . Hemorrhoids, internal 04/16/2015  . Hypercholesteremia 04/16/2015  . Hyperlipidemia   . Hypertension   . Primary cancer of left upper lobe of lung ([place])   . Pulmonary embolism ([place])   . Wears dentures    partial upper    SURGICAL HISTORY: Past Surgical History:  Procedure Laterality Date  . COLONOSCOPY    . COLONOSCOPY WITH PROPOFOL N/A 05/06/2015   Procedure: COLONOSCOPY WITH PROPOFOL;  Surgeon: Lucilla Lame, MD;  Location: [facility];  Service: Endoscopy;  Laterality: N/A;  ASCENDING COLON POLYPS X 2 TERMINAL ILEUM BIOPSY RANDOM COLON BX. TRANSVERSE COLON POLYP SIGMOID COLON POLYP  . ESOPHAGOGASTRODUODENOSCOPY (EGD) WITH PROPOFOL N/A 05/06/2015   Procedure: ESOPHAGOGASTRODUODENOSCOPY (EGD) WITH PROPOFOL;  Surgeon: Lucilla Lame, MD;  Location: [facility];  Service: Endoscopy;  Laterality: N/A;  GASTRIC BIOPSY X1    SOCIAL HISTORY: Social History   Socioeconomic History  . Marital status: Married    Spouse name: Not on file  . Number of children: Not on file  . Years of education: Not on file  . Highest education level: Not on file  Social Needs  . Financial resource strain: Not on file  . Food insecurity - worry: Not on file  . Food insecurity - inability: Not on file  . Transportation needs - medical: Not on file  . Transportation needs - non-medical: Not on file  Occupational History  .  Not on file  Tobacco Use  . Smoking status: Never Smoker  . Smokeless tobacco: Never Used  Substance and Sexual Activity  . Alcohol use: No    Alcohol/week: 0.0 oz  . Drug use: No  . Sexual activity: Yes    Partners: Female  Other Topics Concern  . Not on file  Social History Narrative  . Not on file    FAMILY HISTORY: Family History  Problem Relation Age of Onset  . Diabetes Mother   . Diabetes Father   . CAD Father    . Dementia Father   . Diabetes Sister   . Cancer Maternal Uncle        Prostate  . Cancer Cousin        prostate    ALLERGIES:  has No Known Allergies.  MEDICATIONS:  Current Outpatient Medications  Medication Sig Dispense Refill  . atorvastatin (LIPITOR) 40 MG tablet Take 1 tablet (40 mg total) by mouth daily at 6 PM. 90 tablet 0  . finasteride (PROSCAR) 5 MG tablet Take 1 tablet (5 mg total) by mouth at bedtime. 14 tablet 0  . metFORMIN (GLUCOPHAGE) 500 MG tablet Take 1 tablet (500 mg total) by mouth 2 (two) times daily with a meal. 180 tablet 0  . ondansetron (ZOFRAN) 8 MG tablet Take 1 tablet (8 mg total) by mouth every 8 (eight) hours as needed for nausea or vomiting (start 3 days; after chemo). 40 tablet 1  . prochlorperazine (COMPAZINE) 10 MG tablet Take 1 tablet (10 mg total) by mouth every 6 (six) hours as needed for nausea or vomiting. 40 tablet 1  . rivaroxaban (XARELTO) 20 MG TABS tablet Take 1 tablet (20 mg total) by mouth daily with supper. 30 tablet 11  . tamsulosin (FLOMAX) 0.4 MG CAPS capsule Take 0.4 mg by mouth every morning.     . Loperamide HCl (IMODIUM A-D PO) Take 1 tablet by mouth as needed (loose stool/diarrhea).     No current facility-administered medications for this visit.      PHYSICAL EXAMINATION: ECOG PERFORMANCE STATUS: 1 - Symptomatic but completely ambulatory  Vitals:   08/25/17 1059  BP: (!) 154/90  Pulse: 74  Resp: 20  Temp: (!) 97 F (36.1 C)   Filed Weights   08/25/17 1059  Weight: 161 lb 8 oz (73.3 kg)    GENERAL: Well-nourished well-developed; Alert, no distress and comfortable. He is alone.  EYES: no pallor or icterus OROPHARYNX: no thrush or ulceration; good dentition   NECK: supple, no masses felt LYMPH:  no palpable lymphadenopathy in the cervical, axillary or inguinal regions LUNGS: clear to auscultation and  No wheeze or crackles HEART/CVS: regular rate & rhythm and no murmurs; No lower extremity edema. Compression socks  in place. ABDOMEN: abdomen soft, non-tender and normal bowel sounds MSK:no cyanosis of digits and no clubbing  PSYCH: alert & oriented x 3 with fluent speech NEURO: no focal motor/sensory deficits SKIN:  no rashes or significant lesions  LABORATORY DATA:  I have reviewed the data as listed Lab Results  Component Value Date   WBC 4.1 08/25/2017   HGB 13.3 08/25/2017   HCT 40.8 08/25/2017   MCV 85.4 08/25/2017   PLT 376 08/25/2017   Recent Labs    06/21/17 1040 07/19/17 1123 08/25/17 1041  NA 134* 132* 134*  K 3.6 4.6 4.2  CL 103 102 103  CO2 20* 22 22  GLUCOSE 165* 169* 167*  BUN '11 14 15  ' CREATININE 1.13 1.28*  1.38*  CALCIUM 9.2 8.8* 9.1  GFRNONAA >60 58* 53*  GFRAA >60 >60 >60  PROT 7.2 6.7 7.4  ALBUMIN 3.8 3.2* 3.3*  AST '19 25 28  ' ALT 24 34 39  ALKPHOS 87 102 104  BILITOT 0.6 0.5 0.6    RADIOGRAPHIC STUDIES: I have personally reviewed the radiological images as listed and agreed with the findings in the report. Ct Chest W Contrast  Result Date: 08/20/2017 CLINICAL DATA:  Left upper lobe lung cancer post chemotherapy and radiation therapy. Restaging. EXAM: CT CHEST WITH CONTRAST TECHNIQUE: Multidetector CT imaging of the chest was performed during intravenous contrast administration. CONTRAST:  75m ISOVUE-300 IOPAMIDOL (ISOVUE-300) INJECTION 61% COMPARISON:  PET-CT 04/07/2017.  Chest CT 12/23/2016 and 03/22/2017. FINDINGS: Cardiovascular: Minimal coronary artery atherosclerosis noted. No acute vascular findings. The heart size is normal. There is no pericardial effusion. Mediastinum/Nodes: Several prominent prevascular nodes are again noted, mildly hypermetabolic on recent PET-CT. The largest node measures 11 mm short axis on image 44 (14 mm on CT from 5 months ago).No progressive adenopathy demonstrated. There are no other enlarged mediastinal, hilar or axillary lymph nodes. There is stable thyroid nodularity without abnormal metabolic activity on previous PET-CT.  Lungs/Pleura: Trace pleural fluid or thickening on the left. There is progressive left upper lobe consolidation with volume loss and air bronchograms, obscuring the previously demonstrated mass. There was no residual hypermetabolic activity in this area on PET-CT. These findings extend into the superior segment of the lower lobe and have a sharp inferior margin on the reformatted images, likely related to prior radiation. No suspicious nodules are seen within the aerated lungs. There is a calcified right lower lobe granuloma. There are patchy ground-glass opacities in the right lower lobe, likely atelectasis or inflammation. Upper abdomen: The visualized upper abdomen appears stable without suspicious findings. There are hepatic and left renal cysts. No adrenal mass. Musculoskeletal/Chest wall: There is no chest wall mass or suspicious osseous finding. IMPRESSION: 1. Progressive radiation changes in the superior left hemithorax with volume loss and consolidation. Previously demonstrated mass lesion is obscured, but there was no residual hypermetabolic pulmonary activity on PET-CT. Continued follow-up recommended. 2. Prevascular lymph nodes are stable to slightly decreased in size and may be reactive or reflect treated tumor. No progressive adenopathy. 3. Stable appearance of the visualized upper abdomen. Electronically Signed   By: WRichardean SaleM.D.   On: 08/20/2017 11:02   IMPRESSION: 4.3 x 2.5 cm mass-like opacity in the medial left upper lobe, corresponding to the patient's known primary bronchogenic neoplasm grossly unchanged.  Associated prevascular lymphadenopathy measuring up to 14 mm short axis, unchanged.   Electronically Signed   By: SJulian HyM.D.   On: 03/22/2017 12:55  IMPRESSION: 1. Metabolic activity associated with persistently enlarged prevascular lymph nodes is concerning for residual carcinoma. Size stability raise the possibility of inflammatory lymph nodes. 2. No  significant activity associated with consolidated lung along the LEFT upper mediastinum consistent with treated tumor / lung. 3. No new or distant metastatic disease.   Electronically Signed   By: SSuzy BouchardM.D.   On: 04/07/2017 15:03  ASSESSMENT & PLAN:   [place] of [place] of lung [place] # Adenocarcinoma of the lung metastatic/stage IV- bilateral supraclavicular adenopathy; ROS-1 POSITIVE.  S/p chemo-RT to LUL mass- [finished sep 27th]; Dec 14th CT LUL radiation changes; no progressive disease; stable prevascular lymph node.  # Currently Crizotinib 250 mg/day. Continue the same.  Tolerating well without any significant side effects.  #  CKD- multifactorial [prostatitic obstruction/diabetes]-  Creat ~1.3/ monitor for now.   # Bil LE swelling CKD/ Crizotinib-STABLE. Continue compression socks.  # Bilateral PE & left lower extremity DVT- improved. continues Xarelto.   #  RTC in 4 weeks/labs/MD.   # I reviewed the blood work- with the patient in detail; also reviewed the imaging independently [as summarized above]; and with the patient in detail.      Cammie Sickle, MD 08/25/2017 11:37 AM

## 2017-08-25 NOTE — Assessment & Plan Note (Addendum)
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  S/p chemo-RT to LUL mass- [finished sep 27th]; Dec 14th CT LUL radiation changes; no progressive disease; stable prevascular lymph node.  # Currently Crizotinib 250 mg/day. Continue the same.  Tolerating well without any significant side effects.  # CKD- multifactorial [prostatitic obstruction/diabetes]-  Creat ~1.3/ monitor for now.   # Bil LE swelling CKD/ Crizotinib-STABLE. Continue compression socks.  # Bilateral PE & left lower extremity DVT- improved. continues Xarelto.   #  RTC in 4 weeks/labs/MD.   # I reviewed the blood work- with the patient in detail; also reviewed the imaging independently [as summarized above]; and with the patient in detail.

## 2017-09-22 ENCOUNTER — Other Ambulatory Visit: Payer: Self-pay

## 2017-09-22 ENCOUNTER — Inpatient Hospital Stay (HOSPITAL_BASED_OUTPATIENT_CLINIC_OR_DEPARTMENT_OTHER): Payer: Self-pay | Admitting: Internal Medicine

## 2017-09-22 ENCOUNTER — Inpatient Hospital Stay: Payer: Self-pay | Attending: Internal Medicine

## 2017-09-22 VITALS — BP 139/75 | HR 68 | Temp 97.6°F | Resp 20

## 2017-09-22 DIAGNOSIS — Z7901 Long term (current) use of anticoagulants: Secondary | ICD-10-CM

## 2017-09-22 DIAGNOSIS — Z86711 Personal history of pulmonary embolism: Secondary | ICD-10-CM

## 2017-09-22 DIAGNOSIS — C3412 Malignant neoplasm of upper lobe, left bronchus or lung: Secondary | ICD-10-CM | POA: Insufficient documentation

## 2017-09-22 DIAGNOSIS — C7800 Secondary malignant neoplasm of unspecified lung: Secondary | ICD-10-CM | POA: Insufficient documentation

## 2017-09-22 DIAGNOSIS — Z7984 Long term (current) use of oral hypoglycemic drugs: Secondary | ICD-10-CM

## 2017-09-22 DIAGNOSIS — Z923 Personal history of irradiation: Secondary | ICD-10-CM | POA: Insufficient documentation

## 2017-09-22 DIAGNOSIS — Z8042 Family history of malignant neoplasm of prostate: Secondary | ICD-10-CM

## 2017-09-22 DIAGNOSIS — Z9221 Personal history of antineoplastic chemotherapy: Secondary | ICD-10-CM

## 2017-09-22 DIAGNOSIS — N183 Chronic kidney disease, stage 3 (moderate): Secondary | ICD-10-CM | POA: Insufficient documentation

## 2017-09-22 LAB — CBC WITH DIFFERENTIAL/PLATELET
BASOS ABS: 0 10*3/uL (ref 0–0.1)
BASOS PCT: 0 %
Eosinophils Absolute: 0.2 10*3/uL (ref 0–0.7)
Eosinophils Relative: 3 %
HEMATOCRIT: 39.2 % — AB (ref 40.0–52.0)
Hemoglobin: 12.7 g/dL — ABNORMAL LOW (ref 13.0–18.0)
Lymphocytes Relative: 21 %
Lymphs Abs: 1.1 10*3/uL (ref 1.0–3.6)
MCH: 27.4 pg (ref 26.0–34.0)
MCHC: 32.4 g/dL (ref 32.0–36.0)
MCV: 84.6 fL (ref 80.0–100.0)
MONO ABS: 1 10*3/uL (ref 0.2–1.0)
Monocytes Relative: 18 %
NEUTROS ABS: 3 10*3/uL (ref 1.4–6.5)
Neutrophils Relative %: 58 %
Platelets: 334 10*3/uL (ref 150–440)
RBC: 4.63 MIL/uL (ref 4.40–5.90)
RDW: 16.9 % — AB (ref 11.5–14.5)
WBC: 5.3 10*3/uL (ref 3.8–10.6)

## 2017-09-22 LAB — COMPREHENSIVE METABOLIC PANEL
ALK PHOS: 104 U/L (ref 38–126)
ALT: 46 U/L (ref 17–63)
ANION GAP: 10 (ref 5–15)
AST: 30 U/L (ref 15–41)
Albumin: 3.3 g/dL — ABNORMAL LOW (ref 3.5–5.0)
BUN: 21 mg/dL — ABNORMAL HIGH (ref 6–20)
CALCIUM: 8.6 mg/dL — AB (ref 8.9–10.3)
CO2: 20 mmol/L — ABNORMAL LOW (ref 22–32)
Chloride: 105 mmol/L (ref 101–111)
Creatinine, Ser: 1.39 mg/dL — ABNORMAL HIGH (ref 0.61–1.24)
GFR calc Af Amer: >60 mL/min (ref >60)
GFR calc non Af Amer: 52 mL/min — ABNORMAL LOW (ref >60)
Glucose, Bld: 135 mg/dL — ABNORMAL HIGH (ref 65–99)
Potassium: 4.3 mmol/L (ref 3.5–5.1)
Sodium: 135 mmol/L (ref 135–145)
TOTAL PROTEIN: 6.9 g/dL (ref 6.5–8.1)
Total Bilirubin: 0.5 mg/dL (ref 0.3–1.2)

## 2017-09-22 NOTE — Assessment & Plan Note (Addendum)
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  S/p chemo-RT to LUL mass- [finished sep 27th]; Dec 14th CT LUL radiation changes; no progressive disease; stable prevascular lymph node.  # Currently Crizotinib 250 mg/day. Continue the same.  Tolerating well without any significant side effects. Will plan CT scan in 3-4 months from dec 2018.  Will order MRI of the brain at next visit.  # CKD- multifactorial [prostatitic obstruction/diabetes]-  Creat ~1.3/ monitor for now.   # Bil LE swelling CKD/ Crizotinib-STABLE. Continue compression socks.  # Bilateral PE & left lower extremity DVT- improved. continues Xarelto.   #  RTC in 4 weeks/labs/MD.

## 2017-09-22 NOTE — Progress Notes (Signed)
[name] NOTE  Patient Care Team: Roselee Nova, MD as PCP - General (Family Medicine)  CHIEF COMPLAINTS/PURPOSE OF CONSULTATION:  Lung cancer  #  Oncology History   # OCT 2017- ADENO CA LUNG; STAGE IV [; LUL; bil supraclavicular LN; Left neck LN Bx]; ROS-1 MUTATED; s/p Carbo-alimta x1[oct 2017]  # NOV 1st 2017- XALKORI 250 mg BID; JAN 15th CT- PR;  # AUG 27th Chemo-RT to persistent LUL/mediastinal LN [s/p carbo-taxol- with RT; finished Sep 22nd 2018]  # LLE DVT/bil PE/Multiple strokes [? On xarelto]-Lovenox; Jan mid 2018- xarelto [lovenox-insurance issues]  # MRI brain- multiple infarcts [2d echo/bubble study-NEG's/p Neurology eval]  # # s/p TURP [Sep 2017; Dr.Cope] DEC 26th CT- distended bladder   # MOLECULAR TESTING- ROS-1 POSITIVE; ALK/EGFR-NEG; PDL-1 EXPRESSION- 90%** [HIGH]     Primary cancer of left upper lobe of lung ([place])     HISTORY OF PRESENTING ILLNESS:  Melvin Willis 67 y.o.  male metastatic adenocarcinoma of the lung ROS- positive; currently on crizotinib is here for follow-up.  Denies any visual changes.  Denies any headaches but denies any nausea vomiting.  Denies any swelling in the legs.   ROS: A complete 10 point review of system is done which is negative except mentioned above in history of present illness  MEDICAL HISTORY:  Past Medical History:  Diagnosis Date  . Abnormal prostate specific antigen 08/08/2012  . Adiposity 04/16/2015  . Chronic kidney disease (CKD), stage III (moderate) ([place]) 11/27/2016  . CVA (cerebral vascular accident) ([place]) 06/17/2016  . Diabetes mellitus without complication ([place])   . Diverticulosis of sigmoid colon 04/16/2015  . Dyslipidemia 03/18/2015  . Hemorrhoids, internal 04/16/2015  . Hypercholesteremia 04/16/2015  . Hyperlipidemia   . Hypertension   . Primary cancer of left upper lobe of lung ([place])   . Pulmonary embolism ([place])   . Wears dentures    partial upper    SURGICAL  HISTORY: Past Surgical History:  Procedure Laterality Date  . COLONOSCOPY    . COLONOSCOPY WITH PROPOFOL N/A 05/06/2015   Procedure: COLONOSCOPY WITH PROPOFOL;  Surgeon: Lucilla Lame, MD;  Location: [facility];  Service: Endoscopy;  Laterality: N/A;  ASCENDING COLON POLYPS X 2 TERMINAL ILEUM BIOPSY RANDOM COLON BX. TRANSVERSE COLON POLYP SIGMOID COLON POLYP  . ESOPHAGOGASTRODUODENOSCOPY (EGD) WITH PROPOFOL N/A 05/06/2015   Procedure: ESOPHAGOGASTRODUODENOSCOPY (EGD) WITH PROPOFOL;  Surgeon: Lucilla Lame, MD;  Location: [facility];  Service: Endoscopy;  Laterality: N/A;  GASTRIC BIOPSY X1    SOCIAL HISTORY: Social History   Socioeconomic History  . Marital status: Married    Spouse name: Not on file  . Number of children: Not on file  . Years of education: Not on file  . Highest education level: Not on file  Social Needs  . Financial resource strain: Not on file  . Food insecurity - worry: Not on file  . Food insecurity - inability: Not on file  . Transportation needs - medical: Not on file  . Transportation needs - non-medical: Not on file  Occupational History  . Not on file  Tobacco Use  . Smoking status: Never Smoker  . Smokeless tobacco: Never Used  Substance and Sexual Activity  . Alcohol use: No    Alcohol/week: 0.0 oz  . Drug use: No  . Sexual activity: Yes    Partners: Female  Other Topics Concern  . Not on file  Social History Narrative  . Not on file  FAMILY HISTORY: Family History  Problem Relation Age of Onset  . Diabetes Mother   . Diabetes Father   . CAD Father   . Dementia Father   . Diabetes Sister   . Cancer Maternal Uncle        Prostate  . Cancer Cousin        prostate    ALLERGIES:  has No Known Allergies.  MEDICATIONS:  Current Outpatient Medications  Medication Sig Dispense Refill  . atorvastatin (LIPITOR) 40 MG tablet Take 1 tablet (40 mg total) by mouth daily at 6 PM. 90 tablet 0  . finasteride (PROSCAR) 5 MG  tablet Take 1 tablet (5 mg total) by mouth at bedtime. 14 tablet 0  . rivaroxaban (XARELTO) 20 MG TABS tablet Take 1 tablet (20 mg total) by mouth daily with supper. 30 tablet 11  . tamsulosin (FLOMAX) 0.4 MG CAPS capsule Take 0.4 mg by mouth every morning.     . metFORMIN (GLUCOPHAGE) 850 MG tablet Take 1 tablet (850 mg total) by mouth 2 (two) times daily with a meal. 180 tablet 0   No current facility-administered medications for this visit.      PHYSICAL EXAMINATION: ECOG PERFORMANCE STATUS: 1 - Symptomatic but completely ambulatory  Vitals:   09/22/17 1045  BP: 139/75  Pulse: 68  Resp: 20  Temp: 97.6 F (36.4 C)   There were no vitals filed for this visit.  GENERAL: Well-nourished well-developed; Alert, no distress and comfortable. He is alone.  EYES: no pallor or icterus OROPHARYNX: no thrush or ulceration; good dentition   NECK: supple, no masses felt LYMPH:  no palpable lymphadenopathy in the cervical, axillary or inguinal regions LUNGS: clear to auscultation and  No wheeze or crackles HEART/CVS: regular rate & rhythm and no murmurs; No lower extremity edema. Compression socks in place. ABDOMEN: abdomen soft, non-tender and normal bowel sounds MSK:no cyanosis of digits and no clubbing  PSYCH: alert & oriented x 3 with fluent speech NEURO: no focal motor/sensory deficits SKIN:  no rashes or significant lesions  LABORATORY DATA:  I have reviewed the data as listed Lab Results  Component Value Date   WBC 5.3 09/22/2017   HGB 12.7 (L) 09/22/2017   HCT 39.2 (L) 09/22/2017   MCV 84.6 09/22/2017   PLT 334 09/22/2017   Recent Labs    07/19/17 1123 08/25/17 1041 09/22/17 1007  NA 132* 134* 135  K 4.6 4.2 4.3  CL 102 103 105  CO2 22 22 20*  GLUCOSE 169* 167* 135*  BUN 14 15 21*  CREATININE 1.28* 1.38* 1.39*  CALCIUM 8.8* 9.1 8.6*  GFRNONAA 58* 53* 52*  GFRAA >60 >60 >60  PROT 6.7 7.4 6.9  ALBUMIN 3.2* 3.3* 3.3*  AST '25 28 30  ' ALT 34 39 46  ALKPHOS 102 104  104  BILITOT 0.5 0.6 0.5    RADIOGRAPHIC STUDIES: I have personally reviewed the radiological images as listed and agreed with the findings in the report. No results found. IMPRESSION: 4.3 x 2.5 cm mass-like opacity in the medial left upper lobe, corresponding to the patient's known primary bronchogenic neoplasm grossly unchanged.  Associated prevascular lymphadenopathy measuring up to 14 mm short axis, unchanged.   Electronically Signed   By: Julian Hy M.D.   On: 03/22/2017 12:55  IMPRESSION: 1. Metabolic activity associated with persistently enlarged prevascular lymph nodes is concerning for residual carcinoma. Size stability raise the possibility of inflammatory lymph nodes. 2. No significant activity associated with consolidated lung along  the LEFT upper mediastinum consistent with treated tumor / lung. 3. No new or distant metastatic disease.   Electronically Signed   By: Suzy Bouchard M.D.   On: 04/07/2017 15:03  ASSESSMENT & PLAN:   Primary cancer of left upper lobe of lung ([place]) # Adenocarcinoma of the lung metastatic/stage IV- bilateral supraclavicular adenopathy; ROS-1 POSITIVE.  S/p chemo-RT to LUL mass- [finished sep 27th]; Dec 14th CT LUL radiation changes; no progressive disease; stable prevascular lymph node.  # Currently Crizotinib 250 mg/day. Continue the same.  Tolerating well without any significant side effects. Will plan CT scan in 3-4 months from dec 2018.   # CKD- multifactorial [prostatitic obstruction/diabetes]-  Creat ~1.3/ monitor for now.   # Bil LE swelling CKD/ Crizotinib-STABLE. Continue compression socks.  # Bilateral PE & left lower extremity DVT- improved. continues Xarelto.   #  RTC in 4 weeks/labs/MD.       Cammie Sickle, MD 10/05/2017 7:46 PM

## 2017-10-01 ENCOUNTER — Ambulatory Visit (INDEPENDENT_AMBULATORY_CARE_PROVIDER_SITE_OTHER): Payer: Self-pay | Admitting: Family Medicine

## 2017-10-01 ENCOUNTER — Encounter: Payer: Self-pay | Admitting: Family Medicine

## 2017-10-01 VITALS — BP 126/64 | HR 61 | Temp 98.3°F | Resp 14 | Wt 162.3 lb

## 2017-10-01 DIAGNOSIS — E119 Type 2 diabetes mellitus without complications: Secondary | ICD-10-CM

## 2017-10-01 DIAGNOSIS — R972 Elevated prostate specific antigen [PSA]: Secondary | ICD-10-CM

## 2017-10-01 DIAGNOSIS — E785 Hyperlipidemia, unspecified: Secondary | ICD-10-CM

## 2017-10-01 LAB — POCT GLYCOSYLATED HEMOGLOBIN (HGB A1C): Hemoglobin A1C: 7.7

## 2017-10-01 MED ORDER — METFORMIN HCL 850 MG PO TABS: 850.0000 mg | ORAL_TABLET | Freq: Two times a day (BID) | ORAL | 0 refills | Status: DC

## 2017-10-01 NOTE — Progress Notes (Signed)
Name: Melvin Willis   MRN: [id number]    DOB: 1951/06/10   Date:10/01/2017       Progress Note  Subjective  Chief Complaint  Chief Complaint  Patient presents with  . Diabetes    Pt checks his sugars saily the highest its been 200 but not all the time  . Hyperlipidemia  . Medication Refill    Diabetes  He presents for his follow-up diabetic visit. He has type 2 diabetes mellitus. His disease course has been worsening. There are no hypoglycemic associated symptoms. Pertinent negatives for hypoglycemia include no dizziness, nervousness/anxiousness or sweats. Pertinent negatives for diabetes include no fatigue, no foot paresthesias, no polydipsia and no polyuria. There are no diabetic complications. Pertinent negatives for diabetic complications include no CVA, heart disease or peripheral neuropathy. Current diabetic treatment includes oral agent (monotherapy). His weight is stable. He is following a generally healthy diet. He rarely participates in exercise. He monitors blood glucose at home 1-2 x per day. His breakfast blood glucose range is generally 130-140 mg/dl. Eye exam is current.  Hyperlipidemia  This is a chronic problem. The problem is controlled. Recent lipid tests were reviewed and are normal. Pertinent negatives include no leg pain, myalgias or shortness of breath. Current antihyperlipidemic treatment includes statins. Risk factors for coronary artery disease include dyslipidemia and diabetes mellitus.     Past Medical History:  Diagnosis Date  . Abnormal prostate specific antigen 08/08/2012  . Adiposity 04/16/2015  . Chronic kidney disease (CKD), stage III (moderate) ([place]) 11/27/2016  . CVA (cerebral vascular accident) ([place]) 06/17/2016  . Diabetes mellitus without complication ([place])   . Diverticulosis of sigmoid colon 04/16/2015  . Dyslipidemia 03/18/2015  . Hemorrhoids, internal 04/16/2015  . Hypercholesteremia 04/16/2015  . Hyperlipidemia   . Hypertension   . Primary cancer  of left upper lobe of lung ([place])   . Pulmonary embolism ([place])   . Wears dentures    partial upper    Past Surgical History:  Procedure Laterality Date  . COLONOSCOPY    . COLONOSCOPY WITH PROPOFOL N/A 05/06/2015   Procedure: COLONOSCOPY WITH PROPOFOL;  Surgeon: Lucilla Lame, MD;  Location: [facility];  Service: Endoscopy;  Laterality: N/A;  ASCENDING COLON POLYPS X 2 TERMINAL ILEUM BIOPSY RANDOM COLON BX. TRANSVERSE COLON POLYP SIGMOID COLON POLYP  . ESOPHAGOGASTRODUODENOSCOPY (EGD) WITH PROPOFOL N/A 05/06/2015   Procedure: ESOPHAGOGASTRODUODENOSCOPY (EGD) WITH PROPOFOL;  Surgeon: Lucilla Lame, MD;  Location: [facility];  Service: Endoscopy;  Laterality: N/A;  GASTRIC BIOPSY X1    Family History  Problem Relation Age of Onset  . Diabetes Mother   . Diabetes Father   . CAD Father   . Dementia Father   . Diabetes Sister   . Cancer Maternal Uncle        Prostate  . Cancer Cousin        prostate    Social History   Socioeconomic History  . Marital status: Married    Spouse name: Not on file  . Number of children: Not on file  . Years of education: Not on file  . Highest education level: Not on file  Social Needs  . Financial resource strain: Not on file  . Food insecurity - worry: Not on file  . Food insecurity - inability: Not on file  . Transportation needs - medical: Not on file  . Transportation needs - non-medical: Not on file  Occupational History  . Not on file  Tobacco Use  . Smoking status:  Never Smoker  . Smokeless tobacco: Never Used  Substance and Sexual Activity  . Alcohol use: No    Alcohol/week: 0.0 oz  . Drug use: No  . Sexual activity: Yes    Partners: Female  Other Topics Concern  . Not on file  Social History Narrative  . Not on file     Current Outpatient Medications:  .  atorvastatin (LIPITOR) 40 MG tablet, Take 1 tablet (40 mg total) by mouth daily at 6 PM., Disp: 90 tablet, Rfl: 0 .  finasteride (PROSCAR) 5 MG tablet,  Take 1 tablet (5 mg total) by mouth at bedtime., Disp: 14 tablet, Rfl: 0 .  Loperamide HCl (IMODIUM A-D PO), Take 1 tablet by mouth as needed (loose stool/diarrhea)., Disp: , Rfl:  .  metFORMIN (GLUCOPHAGE) 500 MG tablet, Take 1 tablet (500 mg total) by mouth 2 (two) times daily with a meal., Disp: 180 tablet, Rfl: 0 .  prochlorperazine (COMPAZINE) 10 MG tablet, Take 1 tablet (10 mg total) by mouth every 6 (six) hours as needed for nausea or vomiting., Disp: 40 tablet, Rfl: 1 .  rivaroxaban (XARELTO) 20 MG TABS tablet, Take 1 tablet (20 mg total) by mouth daily with supper., Disp: 30 tablet, Rfl: 11 .  tamsulosin (FLOMAX) 0.4 MG CAPS capsule, Take 0.4 mg by mouth every morning. , Disp: , Rfl:  .  ondansetron (ZOFRAN) 8 MG tablet, Take 1 tablet (8 mg total) by mouth every 8 (eight) hours as needed for nausea or vomiting (start 3 days; after chemo). (Patient not taking: Reported on 09/22/2017), Disp: 40 tablet, Rfl: 1  No Known Allergies   Review of Systems  Constitutional: Negative for fatigue.  Respiratory: Negative for shortness of breath.   Musculoskeletal: Negative for myalgias.  Neurological: Negative for dizziness.  Endo/Heme/Allergies: Negative for polydipsia.  Psychiatric/Behavioral: The patient is not nervous/anxious.       Objective  Vitals:   10/01/17 1000  BP: 126/64  Pulse: 61  Resp: 14  Temp: 98.3 F (36.8 C)  TempSrc: Oral  SpO2: 95%  Weight: 162 lb 4.8 oz (73.6 kg)    Physical Exam  Constitutional: He is oriented to person, place, and time and well-developed, well-nourished, and in no distress.  HENT:  Head: Normocephalic and atraumatic.  Cardiovascular: Normal rate, regular rhythm and normal heart sounds.  No murmur heard. Pulmonary/Chest: Effort normal and breath sounds normal. He has no wheezes.  Abdominal: Soft. Bowel sounds are normal. There is no tenderness.  Musculoskeletal: He exhibits no edema.  Neurological: He is alert and oriented to person,  place, and time.  Psychiatric: Mood, memory, affect and judgment normal.  Nursing note and vitals reviewed.     Assessment & Plan  1. Type 2 diabetes mellitus without complication, without long-term current use of insulin (HCC)   A1c 7.7%, we will increase his metformin to 850 mg twice a day, patient compliant with dietary and lifestyle changes - POCT HgB A1C - metFORMIN (GLUCOPHAGE) 850 MG tablet; Take 1 tablet (850 mg total) by mouth 2 (two) times daily with a meal.  Dispense: 180 tablet; Refill: 0  2. Dyslipidemia FLP at goal from October 2018, continue on statin  3. Elevated PSA measurement Being managed by urology   Okey Dupre A. [facility] 10/01/2017 10:27 AM

## 2017-10-22 ENCOUNTER — Inpatient Hospital Stay: Payer: Self-pay | Attending: Internal Medicine

## 2017-10-22 ENCOUNTER — Encounter: Payer: Self-pay | Admitting: Internal Medicine

## 2017-10-22 ENCOUNTER — Inpatient Hospital Stay (HOSPITAL_BASED_OUTPATIENT_CLINIC_OR_DEPARTMENT_OTHER): Payer: Self-pay | Admitting: Internal Medicine

## 2017-10-22 VITALS — BP 144/83 | HR 74 | Temp 97.9°F | Resp 16 | Wt 162.6 lb

## 2017-10-22 DIAGNOSIS — C3412 Malignant neoplasm of upper lobe, left bronchus or lung: Secondary | ICD-10-CM | POA: Insufficient documentation

## 2017-10-22 DIAGNOSIS — Z9221 Personal history of antineoplastic chemotherapy: Secondary | ICD-10-CM | POA: Insufficient documentation

## 2017-10-22 DIAGNOSIS — I639 Cerebral infarction, unspecified: Secondary | ICD-10-CM

## 2017-10-22 DIAGNOSIS — J069 Acute upper respiratory infection, unspecified: Secondary | ICD-10-CM

## 2017-10-22 DIAGNOSIS — Z79899 Other long term (current) drug therapy: Secondary | ICD-10-CM | POA: Insufficient documentation

## 2017-10-22 DIAGNOSIS — N183 Chronic kidney disease, stage 3 (moderate): Secondary | ICD-10-CM

## 2017-10-22 DIAGNOSIS — R112 Nausea with vomiting, unspecified: Secondary | ICD-10-CM

## 2017-10-22 DIAGNOSIS — C7800 Secondary malignant neoplasm of unspecified lung: Secondary | ICD-10-CM

## 2017-10-22 DIAGNOSIS — Z86711 Personal history of pulmonary embolism: Secondary | ICD-10-CM

## 2017-10-22 DIAGNOSIS — Z923 Personal history of irradiation: Secondary | ICD-10-CM | POA: Insufficient documentation

## 2017-10-22 DIAGNOSIS — I129 Hypertensive chronic kidney disease with stage 1 through stage 4 chronic kidney disease, or unspecified chronic kidney disease: Secondary | ICD-10-CM

## 2017-10-22 DIAGNOSIS — Z7984 Long term (current) use of oral hypoglycemic drugs: Secondary | ICD-10-CM | POA: Insufficient documentation

## 2017-10-22 DIAGNOSIS — E1122 Type 2 diabetes mellitus with diabetic chronic kidney disease: Secondary | ICD-10-CM

## 2017-10-22 DIAGNOSIS — Z7901 Long term (current) use of anticoagulants: Secondary | ICD-10-CM

## 2017-10-22 LAB — CBC WITH DIFFERENTIAL/PLATELET
BASOS PCT: 0 %
Basophils Absolute: 0 10*3/uL (ref 0–0.1)
Eosinophils Absolute: 0.1 10*3/uL (ref 0–0.7)
Eosinophils Relative: 4 %
HEMATOCRIT: 39 % — AB (ref 40.0–52.0)
HEMOGLOBIN: 13 g/dL (ref 13.0–18.0)
LYMPHS ABS: 0.9 10*3/uL — AB (ref 1.0–3.6)
LYMPHS PCT: 26 %
MCH: 27.2 pg (ref 26.0–34.0)
MCHC: 33.3 g/dL (ref 32.0–36.0)
MCV: 81.8 fL (ref 80.0–100.0)
MONO ABS: 0.6 10*3/uL (ref 0.2–1.0)
MONOS PCT: 16 %
NEUTROS ABS: 1.8 10*3/uL (ref 1.4–6.5)
NEUTROS PCT: 54 %
Platelets: 291 10*3/uL (ref 150–440)
RBC: 4.77 MIL/uL (ref 4.40–5.90)
RDW: 16.9 % — AB (ref 11.5–14.5)
WBC: 3.4 10*3/uL — ABNORMAL LOW (ref 3.8–10.6)

## 2017-10-22 LAB — COMPREHENSIVE METABOLIC PANEL
ALBUMIN: 3.3 g/dL — AB (ref 3.5–5.0)
ALK PHOS: 95 U/L (ref 38–126)
ALT: 36 U/L (ref 17–63)
ANION GAP: 9 (ref 5–15)
AST: 22 U/L (ref 15–41)
BUN: 17 mg/dL (ref 6–20)
CHLORIDE: 104 mmol/L (ref 101–111)
CO2: 19 mmol/L — AB (ref 22–32)
Calcium: 8.9 mg/dL (ref 8.9–10.3)
Creatinine, Ser: 1.41 mg/dL — ABNORMAL HIGH (ref 0.61–1.24)
GFR calc non Af Amer: 51 mL/min — ABNORMAL LOW (ref >60)
GFR, EST AFRICAN AMERICAN: 59 mL/min — AB (ref >60)
GLUCOSE: 141 mg/dL — AB (ref 65–99)
Potassium: 4.7 mmol/L (ref 3.5–5.1)
SODIUM: 132 mmol/L — AB (ref 135–145)
Total Bilirubin: 0.7 mg/dL (ref 0.3–1.2)
Total Protein: 7 g/dL (ref 6.5–8.1)

## 2017-10-22 NOTE — Progress Notes (Signed)
[facility] NOTE  Patient Care Team: Roselee Nova, MD as PCP - General (Family Medicine)  CHIEF COMPLAINTS/PURPOSE OF CONSULTATION:  Lung cancer  #  Oncology History   # OCT 2017- ADENO CA LUNG; STAGE IV [; LUL; bil supraclavicular LN; Left neck LN Bx]; ROS-1 MUTATED; s/p Carbo-alimta x1[oct 2017]  # NOV 1st 2017- XALKORI 250 mg BID; JAN 15th CT- PR;  # AUG 27th Chemo-RT to persistent LUL/mediastinal LN [s/p carbo-taxol- with RT; finished Sep 22nd 2018]  # LLE DVT/bil PE/Multiple strokes [? On xarelto]-Lovenox; Jan mid 2018- xarelto [lovenox-insurance issues]  # MRI brain- multiple infarcts [2d echo/bubble study-NEG's/p Neurology eval]  # # s/p TURP [Sep 2017; Dr.Cope] DEC 26th CT- distended bladder   # MOLECULAR TESTING- ROS-1 POSITIVE; ALK/EGFR-NEG; PDL-1 EXPRESSION- 90%** [HIGH]     Primary cancer of left upper lobe of lung ([place])     HISTORY OF PRESENTING ILLNESS:  Melvin Willis 67 y.o.  male metastatic adenocarcinoma of the lung ROS- positive; currently on crizotinib is here for follow-up.  Patient interim unfortunately has lost insurance.  He is able to get his medications through pharmaceutical assistance program.  He notes to have intermittent nausea with vomiting-especially since going up in the dose of metformin to 850 mg twice daily.  Denies any visual changes.  Denies any headaches.  Denies any swelling in the legs.   ROS: A complete 10 point review of system is done which is negative except mentioned above in history of present illness  MEDICAL HISTORY:  Past Medical History:  Diagnosis Date  . Abnormal prostate specific antigen 08/08/2012  . Adiposity 04/16/2015  . Chronic kidney disease (CKD), stage III (moderate) ([place]) 11/27/2016  . CVA (cerebral vascular accident) ([place]) 06/17/2016  . Diabetes mellitus without complication ([place])   . Diverticulosis of sigmoid colon 04/16/2015  . Dyslipidemia 03/18/2015  . Hemorrhoids, internal  04/16/2015  . Hypercholesteremia 04/16/2015  . Hyperlipidemia   . Hypertension   . Primary cancer of left upper lobe of lung ([place])   . Pulmonary embolism ([place])   . Wears dentures    partial upper    SURGICAL HISTORY: Past Surgical History:  Procedure Laterality Date  . COLONOSCOPY    . COLONOSCOPY WITH PROPOFOL N/A 05/06/2015   Procedure: COLONOSCOPY WITH PROPOFOL;  Surgeon: Lucilla Lame, MD;  Location: [facility];  Service: Endoscopy;  Laterality: N/A;  ASCENDING COLON POLYPS X 2 TERMINAL ILEUM BIOPSY RANDOM COLON BX. TRANSVERSE COLON POLYP SIGMOID COLON POLYP  . ESOPHAGOGASTRODUODENOSCOPY (EGD) WITH PROPOFOL N/A 05/06/2015   Procedure: ESOPHAGOGASTRODUODENOSCOPY (EGD) WITH PROPOFOL;  Surgeon: Lucilla Lame, MD;  Location: [facility];  Service: Endoscopy;  Laterality: N/A;  GASTRIC BIOPSY X1    SOCIAL HISTORY: Social History   Socioeconomic History  . Marital status: Married    Spouse name: Not on file  . Number of children: Not on file  . Years of education: Not on file  . Highest education level: Not on file  Social Needs  . Financial resource strain: Not on file  . Food insecurity - worry: Not on file  . Food insecurity - inability: Not on file  . Transportation needs - medical: Not on file  . Transportation needs - non-medical: Not on file  Occupational History  . Not on file  Tobacco Use  . Smoking status: Never Smoker  . Smokeless tobacco: Never Used  Substance and Sexual Activity  . Alcohol use: No    Alcohol/week: 0.0 oz  .  Drug use: No  . Sexual activity: Yes    Partners: Female  Other Topics Concern  . Not on file  Social History Narrative  . Not on file    FAMILY HISTORY: Family History  Problem Relation Age of Onset  . Diabetes Mother   . Diabetes Father   . CAD Father   . Dementia Father   . Diabetes Sister   . Cancer Maternal Uncle        Prostate  . Cancer Cousin        prostate    ALLERGIES:  has No Known  Allergies.  MEDICATIONS:  Current Outpatient Medications  Medication Sig Dispense Refill  . atorvastatin (LIPITOR) 40 MG tablet Take 1 tablet (40 mg total) by mouth daily at 6 PM. 90 tablet 0  . finasteride (PROSCAR) 5 MG tablet Take 1 tablet (5 mg total) by mouth at bedtime. 14 tablet 0  . metFORMIN (GLUCOPHAGE) 850 MG tablet Take 1 tablet (850 mg total) by mouth 2 (two) times daily with a meal. 180 tablet 0  . rivaroxaban (XARELTO) 20 MG TABS tablet Take 1 tablet (20 mg total) by mouth daily with supper. 30 tablet 11  . tamsulosin (FLOMAX) 0.4 MG CAPS capsule Take 0.4 mg by mouth every morning.      No current facility-administered medications for this visit.      PHYSICAL EXAMINATION: ECOG PERFORMANCE STATUS: 1 - Symptomatic but completely ambulatory  Vitals:   10/22/17 1109  BP: (!) 144/83  Pulse: 74  Resp: 16  Temp: 97.9 F (36.6 C)   Filed Weights   10/22/17 1109  Weight: 162 lb 9.6 oz (73.8 kg)    GENERAL: Well-nourished well-developed; Alert, no distress and comfortable. He is alone.  EYES: no pallor or icterus OROPHARYNX: no thrush or ulceration; good dentition   NECK: supple, no masses felt LYMPH:  no palpable lymphadenopathy in the cervical, axillary or inguinal regions LUNGS: clear to auscultation and  No wheeze or crackles HEART/CVS: regular rate & rhythm and no murmurs; No lower extremity edema. Compression socks in place. ABDOMEN: abdomen soft, non-tender and normal bowel sounds MSK:no cyanosis of digits and no clubbing  PSYCH: alert & oriented x 3 with fluent speech NEURO: no focal motor/sensory deficits SKIN:  no rashes or significant lesions  LABORATORY DATA:  I have reviewed the data as listed Lab Results  Component Value Date   WBC 3.4 (L) 10/22/2017   HGB 13.0 10/22/2017   HCT 39.0 (L) 10/22/2017   MCV 81.8 10/22/2017   PLT 291 10/22/2017   Recent Labs    08/25/17 1041 09/22/17 1007 10/22/17 1037  NA 134* 135 132*  K 4.2 4.3 4.7  CL  103 105 104  CO2 22 20* 19*  GLUCOSE 167* 135* 141*  BUN 15 21* 17  CREATININE 1.38* 1.39* 1.41*  CALCIUM 9.1 8.6* 8.9  GFRNONAA 53* 52* 51*  GFRAA >60 >60 59*  PROT 7.4 6.9 7.0  ALBUMIN 3.3* 3.3* 3.3*  AST '28 30 22  ' ALT 39 46 36  ALKPHOS 104 104 95  BILITOT 0.6 0.5 0.7    RADIOGRAPHIC STUDIES: I have personally reviewed the radiological images as listed and agreed with the findings in the report. No results found. IMPRESSION: 4.3 x 2.5 cm mass-like opacity in the medial left upper lobe, corresponding to the patient's known primary bronchogenic neoplasm grossly unchanged.  Associated prevascular lymphadenopathy measuring up to 14 mm short axis, unchanged.   Electronically Signed   By: Bertis Ruddy  [place] Pink M.D.   On: 03/22/2017 12:55  IMPRESSION: 1. Metabolic activity associated with persistently enlarged prevascular lymph nodes is concerning for residual carcinoma. Size stability raise the possibility of inflammatory lymph nodes. 2. No significant activity associated with consolidated lung along the LEFT upper mediastinum consistent with treated tumor / lung. 3. No new or distant metastatic disease.   Electronically Signed   By: Suzy Bouchard M.D.   On: 04/07/2017 15:03  ASSESSMENT & PLAN:   Primary cancer of left upper lobe of lung ([place]) # Adenocarcinoma of the lung metastatic/stage IV- bilateral supraclavicular adenopathy; ROS-1 POSITIVE.  S/p chemo-RT to LUL mass- [finished sep 27th]; Dec 14th 2018- CT LUL radiation changes; no progressive disease; stable prevascular lymph node.  # Currently Crizotinib 250 mg/day. Continue the same.  Tolerating well without any significant side effects.  No obvious evidence of progression noted.  # Discussed re: MRI surveillance [also esp. Given n/v] ; also CT scan-however given lack of insurance/financial issues patient reluctant. [See discussion below].  # Nausea/vomitting- ? Metformin [recenly increased to 850 BID];  recommend talking to PCP or call us if he needs a low-dose of metformin.  # CKD- multifactorial [prostatitic obstruction/diabetes]-  Creat ~1.3/ onitor for now.   # Bil LE swelling CKD/ Crizotinib-STABLE. Continue compression socks.  # URI- recommend claritin OTC.   # lost insurance- refer to Jack/ re: insurance enrollment/options.  # Bilateral PE & left lower extremity DVT- improved. continues Xarelto.   #  RTC in 4 weeks/labs/MD; Hold imaging sec to insurance.      Cammie Sickle, MD 10/23/2017 5:09 AM

## 2017-10-22 NOTE — Assessment & Plan Note (Addendum)
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  S/p chemo-RT to LUL mass- [finished sep 27th]; Dec 14th 2018- CT LUL radiation changes; no progressive disease; stable prevascular lymph node.  # Currently Crizotinib 250 mg/day. Continue the same.  Tolerating well without any significant side effects.  No obvious evidence of progression noted.  # Discussed re: MRI surveillance [also esp. Given n/v] ; also CT scan-however given lack of insurance/financial issues patient reluctant. [See discussion below].  # Nausea/vomitting- ? Metformin [recenly increased to 850 BID]; recommend talking to PCP or call us if he needs a low-dose of metformin.  # CKD- multifactorial [prostatitic obstruction/diabetes]-  Creat ~1.3/ onitor for now.   # Bil LE swelling CKD/ Crizotinib-STABLE. Continue compression socks.  # URI- recommend claritin OTC.   # lost insurance- refer to Jack/ re: insurance enrollment/options.  # Bilateral PE & left lower extremity DVT- improved. continues Xarelto.   #  RTC in 4 weeks/labs/MD; Hold imaging sec to insurance.

## 2017-10-23 ENCOUNTER — Telehealth: Payer: Self-pay | Admitting: Internal Medicine

## 2017-10-23 NOTE — Telephone Encounter (Signed)
Melvin Willis- please check if you can help him get back onin medmed

## 2017-10-25 NOTE — Telephone Encounter (Signed)
Patient has no Medical insurance per Dr. Jacinto Reap. Pt will meet with Barnabas Lister to further discuss.

## 2017-11-01 NOTE — Progress Notes (Unsigned)
Patient reported to PSN today that he is concerned about the cost of his medical bills, especially the radiation.  PSN advised patient to contact patient finanacial services to apply for the financial hardship program.

## 2017-11-17 ENCOUNTER — Other Ambulatory Visit: Payer: Self-pay | Admitting: Pharmacist

## 2017-11-17 DIAGNOSIS — C3412 Malignant neoplasm of upper lobe, left bronchus or lung: Secondary | ICD-10-CM

## 2017-11-17 MED ORDER — CRIZOTINIB 250 MG PO CAPS: 250.0000 mg | ORAL_CAPSULE | Freq: Two times a day (BID) | ORAL | 3 refills | Status: DC

## 2017-11-17 NOTE — Telephone Encounter (Signed)
Oral Chemotherapy Pharmacist Encounter   [facility] called and LVM stating that the patient needed a Crizotinib refill. Medication refill was sent to [facility] which dispenses the medication for [place].  Darl Pikes, PharmD, BCPS Hematology/Oncology Clinical Pharmacist [facility] [phone]  11/17/2017 8:50 AM

## 2017-11-18 ENCOUNTER — Telehealth: Payer: Self-pay | Admitting: Pharmacist

## 2017-11-18 NOTE — Telephone Encounter (Signed)
Oral Chemotherapy Pharmacist [facility], they received his refill prescription on 11/17/17 and sent out his prescription on 11/17/17.  Darl Pikes, PharmD, BCPS Hematology/Oncology Clinical Pharmacist [facility] [phone]  11/18/2017 4:34 PM

## 2017-11-19 ENCOUNTER — Inpatient Hospital Stay (HOSPITAL_BASED_OUTPATIENT_CLINIC_OR_DEPARTMENT_OTHER): Payer: Self-pay | Admitting: Internal Medicine

## 2017-11-19 ENCOUNTER — Other Ambulatory Visit: Payer: Self-pay

## 2017-11-19 ENCOUNTER — Inpatient Hospital Stay: Payer: Self-pay | Attending: Internal Medicine

## 2017-11-19 ENCOUNTER — Encounter: Payer: Self-pay | Admitting: Internal Medicine

## 2017-11-19 VITALS — BP 152/87 | HR 60 | Temp 97.9°F | Ht 64.0 in | Wt 164.0 lb

## 2017-11-19 DIAGNOSIS — Z86711 Personal history of pulmonary embolism: Secondary | ICD-10-CM | POA: Insufficient documentation

## 2017-11-19 DIAGNOSIS — C770 Secondary and unspecified malignant neoplasm of lymph nodes of head, face and neck: Secondary | ICD-10-CM | POA: Insufficient documentation

## 2017-11-19 DIAGNOSIS — Z7901 Long term (current) use of anticoagulants: Secondary | ICD-10-CM | POA: Insufficient documentation

## 2017-11-19 DIAGNOSIS — N189 Chronic kidney disease, unspecified: Secondary | ICD-10-CM | POA: Insufficient documentation

## 2017-11-19 DIAGNOSIS — M7989 Other specified soft tissue disorders: Secondary | ICD-10-CM | POA: Insufficient documentation

## 2017-11-19 DIAGNOSIS — C3412 Malignant neoplasm of upper lobe, left bronchus or lung: Secondary | ICD-10-CM | POA: Insufficient documentation

## 2017-11-19 DIAGNOSIS — Z9221 Personal history of antineoplastic chemotherapy: Secondary | ICD-10-CM | POA: Insufficient documentation

## 2017-11-19 DIAGNOSIS — Z86718 Personal history of other venous thrombosis and embolism: Secondary | ICD-10-CM | POA: Insufficient documentation

## 2017-11-19 LAB — CBC WITH DIFFERENTIAL/PLATELET
BASOS ABS: 0 10*3/uL (ref 0–0.1)
Basophils Relative: 1 %
EOS PCT: 5 %
Eosinophils Absolute: 0.2 10*3/uL (ref 0–0.7)
HEMATOCRIT: 39.6 % — AB (ref 40.0–52.0)
Hemoglobin: 13.2 g/dL (ref 13.0–18.0)
LYMPHS PCT: 23 %
Lymphs Abs: 1.2 10*3/uL (ref 1.0–3.6)
MCH: 27.3 pg (ref 26.0–34.0)
MCHC: 33.3 g/dL (ref 32.0–36.0)
MCV: 81.8 fL (ref 80.0–100.0)
Monocytes Absolute: 0.8 10*3/uL (ref 0.2–1.0)
Monocytes Relative: 15 %
NEUTROS ABS: 3 10*3/uL (ref 1.4–6.5)
Neutrophils Relative %: 56 %
PLATELETS: 277 10*3/uL (ref 150–440)
RBC: 4.83 MIL/uL (ref 4.40–5.90)
RDW: 17.6 % — ABNORMAL HIGH (ref 11.5–14.5)
WBC: 5.2 10*3/uL (ref 3.8–10.6)

## 2017-11-19 NOTE — Assessment & Plan Note (Addendum)
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  S/p chemo-RT to LUL mass- [finished sep 27th]; Dec 14th 2018- CT LUL radiation changes; no progressive disease; stable prevascular lymph node.   # Currently Crizotinib 250 mg BID Continue the same.  Tolerating well without any significant side effects.  No obvious evidence of progression noted.  # Nausea/vomitting- improved on metformin 500 BID. HOLD MRI of brain- for now ec to insurance issues.  # CKD- multifactorial [prostatitic obstruction/diabetes]-  Creat ~1.3/ onitor for now.   # Bil LE swelling CKD/ Crizotinib-STABLE. Continue compression socks.  # lost insurance-  insurance enrollment/options "workimg on it".  # Bilateral PE & left lower extremity DVT-stable continue Xarelto.   # fatigue- improved on [facility] care ptgram.   #  RTC in 4 weeks/labs/MD.

## 2017-11-19 NOTE — Progress Notes (Signed)
[facility] NOTE  Patient Care Team: Roselee Nova, MD as PCP - General (Family Medicine)  CHIEF COMPLAINTS/PURPOSE OF CONSULTATION:  Lung cancer  #  Oncology History   # OCT 2017- ADENO CA LUNG; STAGE IV [; LUL; bil supraclavicular LN; Left neck LN Bx]; ROS-1 MUTATED; s/p Carbo-alimta x1[oct 2017]  # NOV 1st 2017- XALKORI 250 mg BID; JAN 15th CT- PR;  # AUG 27th Chemo-RT to persistent LUL/mediastinal LN [s/p carbo-taxol- with RT; finished Sep 22nd 2018]  # LLE DVT/bil PE/Multiple strokes [? On xarelto]-Lovenox; Jan mid 2018- xarelto [lovenox-insurance issues]  # MRI brain- multiple infarcts [2d echo/bubble study-NEG's/p Neurology eval]  # # s/p TURP [Sep 2017; Dr.Cope] DEC 26th CT- distended bladder   # MOLECULAR TESTING- ROS-1 POSITIVE; ALK/EGFR-NEG; PDL-1 EXPRESSION- 90%** [HIGH]     Primary cancer of left upper lobe of lung ([place])     HISTORY OF PRESENTING ILLNESS:  Melvin Willis 67 y.o.  male metastatic adenocarcinoma of the lung ROS- positive; currently on crizotinib is here for follow-up.  Patient states his nausea vomiting diarrhea is improved.  He is currently on metformin 500 mg twice a day.  He admits to keep taking his crizotinib; and also Xarelto.  Denies any visual changes.  Denies any headaches.  Denies any swelling in the legs.   ROS: A complete 10 point review of system is done which is negative except mentioned above in history of present illness  MEDICAL HISTORY:  Past Medical History:  Diagnosis Date  . Abnormal prostate specific antigen 08/08/2012  . Adiposity 04/16/2015  . Chronic kidney disease (CKD), stage III (moderate) ([place]) 11/27/2016  . CVA (cerebral vascular accident) ([place]) 06/17/2016  . Diabetes mellitus without complication ([place])   . Diverticulosis of sigmoid colon 04/16/2015  . Dyslipidemia 03/18/2015  . Hemorrhoids, internal 04/16/2015  . Hypercholesteremia 04/16/2015  . Hyperlipidemia   . Hypertension   .  Primary cancer of left upper lobe of lung ([place])   . Pulmonary embolism ([place])   . Wears dentures    partial upper    SURGICAL HISTORY: Past Surgical History:  Procedure Laterality Date  . COLONOSCOPY    . COLONOSCOPY WITH PROPOFOL N/A 05/06/2015   Procedure: COLONOSCOPY WITH PROPOFOL;  Surgeon: Lucilla Lame, MD;  Location: [facility];  Service: Endoscopy;  Laterality: N/A;  ASCENDING COLON POLYPS X 2 TERMINAL ILEUM BIOPSY RANDOM COLON BX. TRANSVERSE COLON POLYP SIGMOID COLON POLYP  . ESOPHAGOGASTRODUODENOSCOPY (EGD) WITH PROPOFOL N/A 05/06/2015   Procedure: ESOPHAGOGASTRODUODENOSCOPY (EGD) WITH PROPOFOL;  Surgeon: Lucilla Lame, MD;  Location: [facility];  Service: Endoscopy;  Laterality: N/A;  GASTRIC BIOPSY X1    SOCIAL HISTORY: Social History   Socioeconomic History  . Marital status: Married    Spouse name: Not on file  . Number of children: Not on file  . Years of education: Not on file  . Highest education level: Not on file  Social Needs  . Financial resource strain: Not on file  . Food insecurity - worry: Not on file  . Food insecurity - inability: Not on file  . Transportation needs - medical: Not on file  . Transportation needs - non-medical: Not on file  Occupational History  . Not on file  Tobacco Use  . Smoking status: Never Smoker  . Smokeless tobacco: Never Used  Substance and Sexual Activity  . Alcohol use: No    Alcohol/week: 0.0 oz  . Drug use: No  . Sexual activity: Yes  Partners: Female  Other Topics Concern  . Not on file  Social History Narrative  . Not on file    FAMILY HISTORY: Family History  Problem Relation Age of Onset  . Diabetes Mother   . Diabetes Father   . CAD Father   . Dementia Father   . Diabetes Sister   . Cancer Maternal Uncle        Prostate  . Cancer Cousin        prostate    ALLERGIES:  has No Known Allergies.  MEDICATIONS:  Current Outpatient Medications  Medication Sig Dispense Refill  .  atorvastatin (LIPITOR) 40 MG tablet Take 1 tablet (40 mg total) by mouth daily at 6 PM. 90 tablet 0  . crizotinib (XALKORI) 250 MG capsule Take 1 capsule (250 mg total) by mouth 2 (two) times daily. 60 capsule 3  . finasteride (PROSCAR) 5 MG tablet Take 1 tablet (5 mg total) by mouth at bedtime. 14 tablet 0  . metFORMIN (GLUMETZA) 500 MG (MOD) 24 hr tablet Take 500 mg by mouth 2 (two) times daily with a meal.    . rivaroxaban (XARELTO) 20 MG TABS tablet Take 1 tablet (20 mg total) by mouth daily with supper. 30 tablet 11  . tamsulosin (FLOMAX) 0.4 MG CAPS capsule Take 0.4 mg by mouth every morning.      No current facility-administered medications for this visit.      PHYSICAL EXAMINATION: ECOG PERFORMANCE STATUS: 1 - Symptomatic but completely ambulatory  Vitals:   11/19/17 1029  BP: (!) 152/87  Pulse: 60  Temp: 97.9 F (36.6 C)   Filed Weights   11/19/17 1029  Weight: 164 lb (74.4 kg)    GENERAL: Well-nourished well-developed; Alert, no distress and comfortable. He is alone.  EYES: no pallor or icterus OROPHARYNX: no thrush or ulceration; good dentition   NECK: supple, no masses felt LYMPH:  no palpable lymphadenopathy in the cervical, axillary or inguinal regions LUNGS: clear to auscultation and  No wheeze or crackles HEART/CVS: regular rate & rhythm and no murmurs; No lower extremity edema. Compression socks in place. ABDOMEN: abdomen soft, non-tender and normal bowel sounds MSK:no cyanosis of digits and no clubbing  PSYCH: alert & oriented x 3 with fluent speech NEURO: no focal motor/sensory deficits SKIN:  no rashes or significant lesions  LABORATORY DATA:  I have reviewed the data as listed Lab Results  Component Value Date   WBC 5.2 11/19/2017   HGB 13.2 11/19/2017   HCT 39.6 (L) 11/19/2017   MCV 81.8 11/19/2017   PLT 277 11/19/2017   Recent Labs    08/25/17 1041 09/22/17 1007 10/22/17 1037  NA 134* 135 132*  K 4.2 4.3 4.7  CL 103 105 104  CO2 22 20*  19*  GLUCOSE 167* 135* 141*  BUN 15 21* 17  CREATININE 1.38* 1.39* 1.41*  CALCIUM 9.1 8.6* 8.9  GFRNONAA 53* 52* 51*  GFRAA >60 >60 59*  PROT 7.4 6.9 7.0  ALBUMIN 3.3* 3.3* 3.3*  AST _0 ALT 39 46 36  ALKPHOS 104 104 95  BILITOT 0.6 0.5 0.7    RADIOGRAPHIC STUDIES: I have personally reviewed the radiological images as listed and agreed with the findings in the report. No results found. IMPRESSION: 4.3 x 2.5 cm mass-like opacity in the medial left upper lobe, corresponding to the patient's known primary bronchogenic neoplasm grossly unchanged.  Associated prevascular lymphadenopathy measuring up to 14 mm short axis, unchanged.   Electronically Signed  By: Julian Hy M.D.   On: 03/22/2017 12:55  IMPRESSION: 1. Metabolic activity associated with persistently enlarged prevascular lymph nodes is concerning for residual carcinoma. Size stability raise the possibility of inflammatory lymph nodes. 2. No significant activity associated with consolidated lung along the LEFT upper mediastinum consistent with treated tumor / lung. 3. No new or distant metastatic disease.   Electronically Signed   By: Suzy Bouchard M.D.   On: 04/07/2017 15:03  ASSESSMENT & PLAN:   Primary cancer of left upper lobe of lung ([place]) # Adenocarcinoma of the lung metastatic/stage IV- bilateral supraclavicular adenopathy; ROS-1 POSITIVE.  S/p chemo-RT to LUL mass- [finished sep 27th]; Dec 14th 2018- CT LUL radiation changes; no progressive disease; stable prevascular lymph node.   # Currently Crizotinib 250 mg BID Continue the same.  Tolerating well without any significant side effects.  No obvious evidence of progression noted.  # Nausea/vomitting- improved on metformin 500 BID. HOLD MRI of brain- for now ec to insurance issues.  # CKD- multifactorial [prostatitic obstruction/diabetes]-  Creat ~1.3/ onitor for now.   # Bil LE swelling CKD/ Crizotinib-STABLE. Continue  compression socks.  # lost insurance-  insurance enrollment/options "workimg on it".  # Bilateral PE & left lower extremity DVT-stable continue Xarelto.   # fatigue- improved on [facility] care ptgram.   #  RTC in 4 weeks/labs/MD.      Cammie Sickle, MD 11/20/2017 11:35 PM

## 2017-12-17 ENCOUNTER — Encounter: Payer: Self-pay | Admitting: Internal Medicine

## 2017-12-17 ENCOUNTER — Inpatient Hospital Stay (HOSPITAL_BASED_OUTPATIENT_CLINIC_OR_DEPARTMENT_OTHER): Payer: Self-pay | Admitting: Internal Medicine

## 2017-12-17 ENCOUNTER — Inpatient Hospital Stay: Payer: Self-pay | Attending: Internal Medicine

## 2017-12-17 ENCOUNTER — Other Ambulatory Visit: Payer: Self-pay

## 2017-12-17 VITALS — BP 130/74 | HR 59 | Temp 97.9°F | Resp 16 | Wt 167.0 lb

## 2017-12-17 DIAGNOSIS — Z7984 Long term (current) use of oral hypoglycemic drugs: Secondary | ICD-10-CM | POA: Insufficient documentation

## 2017-12-17 DIAGNOSIS — C7800 Secondary malignant neoplasm of unspecified lung: Secondary | ICD-10-CM | POA: Insufficient documentation

## 2017-12-17 DIAGNOSIS — Z79899 Other long term (current) drug therapy: Secondary | ICD-10-CM | POA: Insufficient documentation

## 2017-12-17 DIAGNOSIS — N183 Chronic kidney disease, stage 3 (moderate): Secondary | ICD-10-CM

## 2017-12-17 DIAGNOSIS — C3412 Malignant neoplasm of upper lobe, left bronchus or lung: Secondary | ICD-10-CM

## 2017-12-17 DIAGNOSIS — Z9221 Personal history of antineoplastic chemotherapy: Secondary | ICD-10-CM

## 2017-12-17 DIAGNOSIS — I129 Hypertensive chronic kidney disease with stage 1 through stage 4 chronic kidney disease, or unspecified chronic kidney disease: Secondary | ICD-10-CM

## 2017-12-17 DIAGNOSIS — Z7901 Long term (current) use of anticoagulants: Secondary | ICD-10-CM

## 2017-12-17 DIAGNOSIS — E1122 Type 2 diabetes mellitus with diabetic chronic kidney disease: Secondary | ICD-10-CM | POA: Insufficient documentation

## 2017-12-17 DIAGNOSIS — Z86711 Personal history of pulmonary embolism: Secondary | ICD-10-CM | POA: Insufficient documentation

## 2017-12-17 LAB — COMPREHENSIVE METABOLIC PANEL
ALK PHOS: 99 U/L (ref 38–126)
ALT: 43 U/L (ref 17–63)
ANION GAP: 8 (ref 5–15)
AST: 26 U/L (ref 15–41)
Albumin: 3 g/dL — ABNORMAL LOW (ref 3.5–5.0)
BILIRUBIN TOTAL: 0.4 mg/dL (ref 0.3–1.2)
BUN: 19 mg/dL (ref 6–20)
CALCIUM: 8.6 mg/dL — AB (ref 8.9–10.3)
CO2: 18 mmol/L — AB (ref 22–32)
Chloride: 106 mmol/L (ref 101–111)
Creatinine, Ser: 1.45 mg/dL — ABNORMAL HIGH (ref 0.61–1.24)
GFR, EST AFRICAN AMERICAN: 57 mL/min — AB (ref >60)
GFR, EST NON AFRICAN AMERICAN: 49 mL/min — AB (ref >60)
Glucose, Bld: 168 mg/dL — ABNORMAL HIGH (ref 65–99)
Potassium: 4.6 mmol/L (ref 3.5–5.1)
SODIUM: 132 mmol/L — AB (ref 135–145)
TOTAL PROTEIN: 6.5 g/dL (ref 6.5–8.1)

## 2017-12-17 LAB — CBC WITH DIFFERENTIAL/PLATELET
BASOS ABS: 0 10*3/uL (ref 0–0.1)
BASOS PCT: 1 %
EOS ABS: 0.2 10*3/uL (ref 0–0.7)
Eosinophils Relative: 6 %
HCT: 39 % — ABNORMAL LOW (ref 40.0–52.0)
HEMOGLOBIN: 13 g/dL (ref 13.0–18.0)
Lymphocytes Relative: 30 %
Lymphs Abs: 1.2 10*3/uL (ref 1.0–3.6)
MCH: 27.3 pg (ref 26.0–34.0)
MCHC: 33.4 g/dL (ref 32.0–36.0)
MCV: 81.7 fL (ref 80.0–100.0)
Monocytes Absolute: 0.7 10*3/uL (ref 0.2–1.0)
Monocytes Relative: 17 %
NEUTROS PCT: 46 %
Neutro Abs: 1.9 10*3/uL (ref 1.4–6.5)
Platelets: 260 10*3/uL (ref 150–440)
RBC: 4.77 MIL/uL (ref 4.40–5.90)
RDW: 17.2 % — ABNORMAL HIGH (ref 11.5–14.5)
WBC: 4 10*3/uL (ref 3.8–10.6)

## 2017-12-17 NOTE — Progress Notes (Signed)
[facility] CONSULT NOTE  Patient Care Team: [name], MD as PCP - General (Family Medicine)  CHIEF COMPLAINTS/PURPOSE OF CONSULTATION:  Lung cancer  #  Oncology History   # OCT 2017- ADENO CA LUNG; STAGE IV [; LUL; bil supraclavicular LN; Left neck LN Bx]; ROS-1 MUTATED; s/p Carbo-alimta x1[oct 2017]  # NOV 1st 2017- XALKORI 250 mg BID; JAN 15th CT- PR;  # AUG 27th Chemo-RT to persistent LUL/mediastinal LN [s/p carbo-taxol- with RT; finished Sep 22nd 2018]  # LLE DVT/bil PE/Multiple strokes [? On xarelto]-Lovenox; Jan mid 2018- xarelto [lovenox-insurance issues]  # MRI brain- multiple infarcts [2d echo/bubble study-NEG's/p Neurology eval]  # # s/p TURP [Sep 2017; Dr.Cope] DEC 26th CT- distended bladder   # MOLECULAR TESTING- ROS-1 POSITIVE; ALK/EGFR-NEG; PDL-1 EXPRESSION- 90%** [HIGH]     Primary cancer of left upper lobe of lung ([place])     HISTORY OF PRESENTING ILLNESS:  Melvin Willis 67 y.o.  male metastatic adenocarcinoma of the lung ROS- positive; currently on crizotinib is here for follow-up.  He admits to keep taking his crizotinib; and also Xarelto.  Unfortunately has not had insurance yet-reluctant to get CT scans/MRI because of expense.  Denies any visual changes.  Denies any headaches.  Denies any swelling in the legs.  He denies any worsening shortness of breath or cough.  ROS: A complete 10 point review of system is done which is negative except mentioned above in history of present illness  MEDICAL HISTORY:  Past Medical History:  Diagnosis Date  . Abnormal prostate specific antigen 08/08/2012  . Adiposity 04/16/2015  . Chronic kidney disease (CKD), stage III (moderate) ([place]) 11/27/2016  . CVA (cerebral vascular accident) ([place]) 06/17/2016  . Diabetes mellitus without complication ([place])   . Diverticulosis of sigmoid colon 04/16/2015  . Dyslipidemia 03/18/2015  . Hemorrhoids, internal 04/16/2015  . Hypercholesteremia 04/16/2015  .  Hyperlipidemia   . Hypertension   . Primary cancer of left upper lobe of lung ([place])   . Pulmonary embolism ([place])   . Wears dentures    partial upper    SURGICAL HISTORY: Past Surgical History:  Procedure Laterality Date  . COLONOSCOPY    . COLONOSCOPY WITH PROPOFOL N/A 05/06/2015   Procedure: COLONOSCOPY WITH PROPOFOL;  Surgeon: Lucilla Lame, MD;  Location: [facility];  Service: Endoscopy;  Laterality: N/A;  ASCENDING COLON POLYPS X 2 TERMINAL ILEUM BIOPSY RANDOM COLON BX. TRANSVERSE COLON POLYP SIGMOID COLON POLYP  . ESOPHAGOGASTRODUODENOSCOPY (EGD) WITH PROPOFOL N/A 05/06/2015   Procedure: ESOPHAGOGASTRODUODENOSCOPY (EGD) WITH PROPOFOL;  Surgeon: Lucilla Lame, MD;  Location: [facility];  Service: Endoscopy;  Laterality: N/A;  GASTRIC BIOPSY X1    SOCIAL HISTORY: Social History   Socioeconomic History  . Marital status: Married    Spouse name: Not on file  . Number of children: Not on file  . Years of education: Not on file  . Highest education level: Not on file  Occupational History  . Not on file  Social Needs  . Financial resource strain: Not on file  . Food insecurity:    Worry: Not on file    Inability: Not on file  . Transportation needs:    Medical: Not on file    Non-medical: Not on file  Tobacco Use  . Smoking status: Never Smoker  . Smokeless tobacco: Never Used  Substance and Sexual Activity  . Alcohol use: No    Alcohol/week: 0.0 oz  . Drug use: No  . Sexual activity:  Yes    Partners: Female  Lifestyle  . Physical activity:    Days per week: Not on file    Minutes per session: Not on file  . Stress: Not on file  Relationships  . Social connections:    Talks on phone: Not on file    Gets together: Not on file    Attends religious service: Not on file    Active member of club or organization: Not on file    Attends meetings of clubs or organizations: Not on file    Relationship status: Not on file  . Intimate partner violence:     Fear of current or ex partner: Not on file    Emotionally abused: Not on file    Physically abused: Not on file    Forced sexual activity: Not on file  Other Topics Concern  . Not on file  Social History Narrative  . Not on file    FAMILY HISTORY: Family History  Problem Relation Age of Onset  . Diabetes Mother   . Diabetes Father   . CAD Father   . Dementia Father   . Diabetes Sister   . Cancer Maternal Uncle        Prostate  . Cancer Cousin        prostate    ALLERGIES:  has No Known Allergies.  MEDICATIONS:  Current Outpatient Medications  Medication Sig Dispense Refill  . atorvastatin (LIPITOR) 40 MG tablet Take 1 tablet (40 mg total) by mouth daily at 6 PM. 90 tablet 0  . crizotinib (XALKORI) 250 MG capsule Take 1 capsule (250 mg total) by mouth 2 (two) times daily. 60 capsule 3  . finasteride (PROSCAR) 5 MG tablet Take 1 tablet (5 mg total) by mouth at bedtime. 14 tablet 0  . metFORMIN (GLUMETZA) 500 MG (MOD) 24 hr tablet Take 500 mg by mouth 2 (two) times daily with a meal.    . rivaroxaban (XARELTO) 20 MG TABS tablet Take 1 tablet (20 mg total) by mouth daily with supper. 30 tablet 11  . tamsulosin (FLOMAX) 0.4 MG CAPS capsule Take 0.4 mg by mouth every morning.      No current facility-administered medications for this visit.      PHYSICAL EXAMINATION: ECOG PERFORMANCE STATUS: 1 - Symptomatic but completely ambulatory  Vitals:   12/17/17 1020  BP: 130/74  Pulse: (!) 59  Resp: 16  Temp: 97.9 F (36.6 C)   Filed Weights   12/17/17 1020  Weight: 167 lb (75.8 kg)    GENERAL: Well-nourished well-developed; Alert, no distress and comfortable. He is alone.  EYES: no pallor or icterus OROPHARYNX: no thrush or ulceration; good dentition   NECK: supple, no masses felt LYMPH:  no palpable lymphadenopathy in the cervical, axillary or inguinal regions LUNGS: clear to auscultation and  No wheeze or crackles HEART/CVS: regular rate & rhythm and no murmurs;  No lower extremity edema. Compression socks in place. ABDOMEN: abdomen soft, non-tender and normal bowel sounds MSK:no cyanosis of digits and no clubbing  PSYCH: alert & oriented x 3 with fluent speech NEURO: no focal motor/sensory deficits SKIN:  no rashes or significant lesions  LABORATORY DATA:  I have reviewed the data as listed Lab Results  Component Value Date   WBC 4.0 12/17/2017   HGB 13.0 12/17/2017   HCT 39.0 (L) 12/17/2017   MCV 81.7 12/17/2017   PLT 260 12/17/2017   Recent Labs    09/22/17 1007 10/22/17 1037 12/17/17 1000  NA 135 132* 132*  K 4.3 4.7 4.6  CL 105 104 106  CO2 20* 19* 18*  GLUCOSE 135* 141* 168*  BUN 21* 17 19  CREATININE 1.39* 1.41* 1.45*  CALCIUM 8.6* 8.9 8.6*  GFRNONAA 52* 51* 49*  GFRAA >60 59* 57*  PROT 6.9 7.0 6.5  ALBUMIN 3.3* 3.3* 3.0*  AST _0 ALT 46 36 43  ALKPHOS 104 95 99  BILITOT 0.5 0.7 0.4    RADIOGRAPHIC STUDIES: I have personally reviewed the radiological images as listed and agreed with the findings in the report. No results found. IMPRESSION: 4.3 x 2.5 cm mass-like opacity in the medial left upper lobe, corresponding to the patient's known primary bronchogenic neoplasm grossly unchanged.  Associated prevascular lymphadenopathy measuring up to 14 mm short axis, unchanged.   Electronically Signed   By: Julian Hy M.D.   On: 03/22/2017 12:55  IMPRESSION: 1. Metabolic activity associated with persistently enlarged prevascular lymph nodes is concerning for residual carcinoma. Size stability raise the possibility of inflammatory lymph nodes. 2. No significant activity associated with consolidated lung along the LEFT upper mediastinum consistent with treated tumor / lung. 3. No new or distant metastatic disease.   Electronically Signed   By: Suzy Bouchard M.D.   On: 04/07/2017 15:03  ASSESSMENT & PLAN:   Primary cancer of left upper lobe of lung ([place]) # Adenocarcinoma of the lung  metastatic/stage IV- bilateral supraclavicular adenopathy; ROS-1 POSITIVE.  S/p chemo-RT to LUL mass- [finished sep 27th]; Dec 14th 2018- CT LUL radiation changes; no progressive disease; stable prevascular lymph node.   # Currently Crizotinib 250 mg BID Continue the same.  Tolerating well without any significant side effects.  No obvious evidence of progression noted. Awaiting on insurance approval for CT scans.   # CKD- multifactorial [prostatitic obstruction/diabetes]-  Creat ~1.3-1.45/ monitor for now.   # Bil LE swelling CKD/ Crizotinib-STABLE. Continue compression socks.  # Bilateral PE & left lower extremity DVT-stable continue Xarelto.   #  RTC in 4 weeks/labs/MD.      Cammie Sickle, MD 12/17/2017 10:44 AM

## 2017-12-17 NOTE — Assessment & Plan Note (Addendum)
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  S/p chemo-RT to LUL mass- [finished sep 27th]; Dec 14th 2018- CT LUL radiation changes; no progressive disease; stable prevascular lymph node.   # Currently Crizotinib 250 mg BID Continue the same.  Tolerating well without any significant side effects.  No obvious evidence of progression noted. Awaiting on insurance approval for CT scans.   # CKD- multifactorial [prostatitic obstruction/diabetes]-  Creat ~1.3-1.45/ monitor for now.   # Bil LE swelling CKD/ Crizotinib-STABLE. Continue compression socks.  # Bilateral PE & left lower extremity DVT-stable continue Xarelto.   #  RTC in 4 weeks/labs/MD.

## 2017-12-22 ENCOUNTER — Ambulatory Visit: Payer: Self-pay | Attending: Radiation Oncology | Admitting: Radiation Oncology

## 2018-01-05 ENCOUNTER — Encounter: Payer: Self-pay | Admitting: Family Medicine

## 2018-01-05 ENCOUNTER — Ambulatory Visit (INDEPENDENT_AMBULATORY_CARE_PROVIDER_SITE_OTHER): Payer: Self-pay | Admitting: Family Medicine

## 2018-01-05 VITALS — BP 136/72 | HR 77 | Temp 98.4°F | Resp 18 | Ht 64.0 in | Wt 166.4 lb

## 2018-01-05 DIAGNOSIS — E7800 Pure hypercholesterolemia, unspecified: Secondary | ICD-10-CM

## 2018-01-05 DIAGNOSIS — I82502 Chronic embolism and thrombosis of unspecified deep veins of left lower extremity: Secondary | ICD-10-CM | POA: Insufficient documentation

## 2018-01-05 DIAGNOSIS — C3412 Malignant neoplasm of upper lobe, left bronchus or lung: Secondary | ICD-10-CM

## 2018-01-05 DIAGNOSIS — N183 Chronic kidney disease, stage 3 unspecified: Secondary | ICD-10-CM

## 2018-01-05 DIAGNOSIS — E1122 Type 2 diabetes mellitus with diabetic chronic kidney disease: Secondary | ICD-10-CM

## 2018-01-05 DIAGNOSIS — C3490 Malignant neoplasm of unspecified part of unspecified bronchus or lung: Secondary | ICD-10-CM

## 2018-01-05 DIAGNOSIS — Z8673 Personal history of transient ischemic attack (TIA), and cerebral infarction without residual deficits: Secondary | ICD-10-CM

## 2018-01-05 LAB — POCT UA - MICROALBUMIN: Microalbumin Ur, POC: 20 mg/L

## 2018-01-05 LAB — POCT GLYCOSYLATED HEMOGLOBIN (HGB A1C): HEMOGLOBIN A1C: 8.2

## 2018-01-05 MED ORDER — GLIPIZIDE 5 MG PO TABS: 2.5000 mg | ORAL_TABLET | Freq: Two times a day (BID) | ORAL | 0 refills | Status: DC

## 2018-01-05 MED ORDER — METFORMIN HCL 500 MG PO TABS
500.0000 mg | ORAL_TABLET | Freq: Two times a day (BID) | ORAL | 1 refills | Status: DC
Start: 2018-01-05 — End: 2018-04-07

## 2018-01-05 MED ORDER — ATORVASTATIN CALCIUM 40 MG PO TABS: 40.0000 mg | ORAL_TABLET | ORAL | 1 refills | Status: DC

## 2018-01-05 MED ORDER — LISINOPRIL 10 MG PO TABS: 10.0000 mg | ORAL_TABLET | Freq: Every day | ORAL | 0 refills | Status: DC

## 2018-01-05 NOTE — Progress Notes (Signed)
Name: Melvin Willis   MRN: [id number]    DOB: Sep 03, 1951   Date:01/05/2018       Progress Note  Subjective  Chief Complaint  Chief Complaint  Patient presents with  . Medication Refill    3 month F/U  . Diabetes    Checks a couple of times a week, Average-140 Lowest-102 Highest- a little over 200-'s  . Hyperlipidemia    HPI  DMII: he is on metformin twice daly, could not tolerate a higher dose because if caused nausea and vomiting,  last hgbA1C was 7.7% and today is 8.8%. He states he used to take Glipizide and cannot afford anything more expensive. He has not been compliant with his diet lately, we will resume glipizide. He has CKI and is not taking ace/arb and we will start it today, he is on lipitor but only taking it a few times a week. Last lipid was at goal. FSBS has been in the 200's at home  Chronic DVT left leg, history of PE : on long term Xarelto, rx given by Hematologist. 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 He wears compression stocking hoses and goes to pulmonary rehab. No sob or wheezing, no fever or chills  History of CVA: initially had dizziness and left side weakness, but no sequela now, still on statin and also on Xarelto   Patient Active Problem List   Diagnosis Date Noted  . Chronic deep vein thrombosis (DVT) of left lower extremity ([place]) 01/05/2018  . Chronic kidney disease (CKD), stage III (moderate) ([place]) 11/27/2016  . Old cerebrovascular accident (CVA) without late effect   . Blurred vision, bilateral 06/23/2016  . Primary cancer of left upper lobe of lung ([place]) 06/12/2016  . Mediastinal mass   . History of pulmonary embolus (PE) 06/03/2016  . History of nephrolithiasis 03/27/2016  . Pharyngeal dysphagia 01/01/2016  . Benign neoplasm of ascending colon   .  Benign neoplasm of sigmoid colon   . Benign neoplasm of transverse colon   . Diverticulosis of large intestine without diverticulitis   . Overweight (BMI 25.0-29.9) 04/16/2015  . Diabetes mellitus without complication ([place]) 35/32/9924  . Dyslipidemia 03/18/2015  . Disorder of male genital organ 08/08/2012  . 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 Elevated prostate specific antigen (PSA) 08/08/2012    Past Surgical History:  Procedure Laterality Date  . COLONOSCOPY    . COLONOSCOPY WITH PROPOFOL N/A 05/06/2015   Procedure: COLONOSCOPY WITH PROPOFOL;  Surgeon: Lucilla Lame, MD;  Location: [facility];  Service: Endoscopy;  Laterality: N/A;  ASCENDING COLON POLYPS X 2 TERMINAL ILEUM BIOPSY RANDOM COLON BX. TRANSVERSE COLON POLYP SIGMOID COLON POLYP  . ESOPHAGOGASTRODUODENOSCOPY (EGD) WITH PROPOFOL N/A 05/06/2015   Procedure: ESOPHAGOGASTRODUODENOSCOPY (EGD) WITH PROPOFOL;  Surgeon: Lucilla Lame, MD;  Location: [facility];  Service: Endoscopy;  Laterality: N/A;  GASTRIC BIOPSY X1  . KIDNEY STONE SURGERY  2017    Family History  Problem Relation Age of Onset  . Diabetes Mother   . Diabetes Father   . CAD Father   . Dementia Father   . Diabetes Sister   . Cancer Maternal Uncle        Prostate  . Cancer Cousin        prostate    Social History   Socioeconomic History  .  Marital status: Married    Spouse name: Melissa   . Number of children: 3  . Years of education: Not on file  . Highest education level: Not on file  Occupational History  . Occupation: disable     Comment: from CVA and lung cancer  Social Needs  . Financial resource strain: Somewhat hard  . Food insecurity:    Worry: Never true    Inability: Never true  . Transportation needs:    Medical: No    Non-medical: No  Tobacco Use  . Smoking status: Never Smoker  . Smokeless tobacco: Never Used  Substance and Sexual Activity  . Alcohol use: No    Alcohol/week: 0.0 oz  .  Drug use: No  . Sexual activity: Yes    Partners: Female  Lifestyle  . Physical activity:    Days per week: 1 day    Minutes per session: 40 min  . Stress: Not at all  Relationships  . Social connections:    Talks on phone: Not on file    Gets together: Not on file    Attends religious service: Not on file    Active member of club or organization: Not on file    Attends meetings of clubs or organizations: Not on file    Relationship status: Not on file  . Intimate partner violence:    Fear of current or ex partner: No    Emotionally abused: No    Physically abused: No    Forced sexual activity: No  Other Topics Concern  . Not on file  Social History Narrative   Used to work until 2 years ago when got sick with DVT leg , PE , CVA and found out he had lung cancer. He does not have insurance at this time     Current Outpatient Medications:  .  atorvastatin (LIPITOR) 40 MG tablet, Take 1 tablet (40 mg total) by mouth 3 (three) times a week., Disp: 48 tablet, Rfl: 1 .  crizotinib (XALKORI) 250 MG capsule, Take 1 capsule (250 mg total) by mouth 2 (two) times daily., Disp: 60 capsule, Rfl: 3 .  finasteride (PROSCAR) 5 MG tablet, Take 1 tablet (5 mg total) by mouth at bedtime., Disp: 14 tablet, Rfl: 0 .  rivaroxaban (XARELTO) 20 MG TABS tablet, Take 1 tablet (20 mg total) by mouth daily with supper., Disp: 30 tablet, Rfl: 11 .  tamsulosin (FLOMAX) 0.4 MG CAPS capsule, Take 0.4 mg by mouth every morning. , Disp: , Rfl:  .  glipiZIDE (GLUCOTROL) 5 MG tablet, Take 0.5 tablets (2.5 mg total) by mouth 2 (two) times daily before a meal., Disp: 180 tablet, Rfl: 0 .  lisinopril (PRINIVIL,ZESTRIL) 10 MG tablet, Take 1 tablet (10 mg total) by mouth daily., Disp: 90 tablet, Rfl: 0 .  metFORMIN (GLUCOPHAGE) 500 MG tablet, Take 1 tablet (500 mg total) by mouth 2 (two) times daily with a meal., Disp: 180 tablet, Rfl: 1  No Known Allergies   ROS  Constitutional: Negative for fever or weight  change.  Respiratory: Negative for cough and shortness of breath.   Cardiovascular: Negative for chest pain or palpitations.  Gastrointestinal: Negative for abdominal pain, no bowel changes.  Musculoskeletal: Negative for gait problem or joint swelling.  Skin: Negative for rash.  Neurological: Negative for dizziness or headache.  No other specific complaints in a complete review of systems (except as listed in HPI above).  Objective  Vitals:   01/05/18 1002  BP: 136/72  Pulse: 77  Resp: 18  Temp: 98.4 F (36.9 C)  TempSrc: Oral  SpO2: 98%  Weight: 166 lb 6.4 oz (75.5 kg)  Height: '5\' 4"'  (1.626 m)    Body mass index is 28.56 kg/m.  Physical Exam  Constitutional: Patient appears well-developed and well-nourished. Overweight.  No distress.  HEENT: head atraumatic, normocephalic, pupils equal and reactive to light,neck supple, throat within normal limits Cardiovascular: Normal rate, regular rhythm and normal heart sounds.  No murmur heard. No BLE edema. Pulmonary/Chest: Effort normal and breath sounds normal. No respiratory distress. Abdominal: Soft.  There is no tenderness. Psychiatric: Patient has a normal mood and affect. behavior is normal. Judgment and thought content normal.  Recent Results (from the past 2160 hour(s))  Comprehensive metabolic panel     Status: Abnormal   Collection Time: 10/22/17 10:37 AM  Result Value Ref Range   Sodium 132 (L) 135 - 145 mmol/L   Potassium 4.7 3.5 - 5.1 mmol/L   Chloride 104 101 - 111 mmol/L   CO2 19 (L) 22 - 32 mmol/L   Glucose, Bld 141 (H) 65 - 99 mg/dL   BUN 17 6 - 20 mg/dL   Creatinine, Ser 1.41 (H) 0.61 - 1.24 mg/dL   Calcium 8.9 8.9 - 10.3 mg/dL   Total Protein 7.0 6.5 - 8.1 g/dL   Albumin 3.3 (L) 3.5 - 5.0 g/dL   AST 22 15 - 41 U/L   ALT 36 17 - 63 U/L   Alkaline Phosphatase 95 38 - 126 U/L   Total Bilirubin 0.7 0.3 - 1.2 mg/dL   GFR calc non Af Amer 51 (L) >60 mL/min   GFR calc Af Amer 59 (L) >60 mL/min    Comment:  (NOTE) The eGFR has been calculated using the CKD EPI equation. This calculation has not been validated in all clinical situations. eGFR's persistently <60 mL/min signify possible Chronic Kidney Disease.    Anion gap 9 5 - 15    Comment: Performed at [facility], [address] [place], [place] [address]  CBC with Differential/Platelet     Status: Abnormal   Collection Time: 10/22/17 10:37 AM  Result Value Ref Range   WBC 3.4 (L) 3.8 - 10.6 K/uL   RBC 4.77 4.40 - 5.90 MIL/uL   Hemoglobin 13.0 13.0 - 18.0 g/dL   HCT 39.0 (L) 40.0 - 52.0 %   MCV 81.8 80.0 - 100.0 fL   MCH 27.2 26.0 - 34.0 pg   MCHC 33.3 32.0 - 36.0 g/dL   RDW 16.9 (H) 11.5 - 14.5 %   Platelets 291 150 - 440 K/uL   Neutrophils Relative % 54 %   Neutro Abs 1.8 1.4 - 6.5 K/uL   Lymphocytes Relative 26 %   Lymphs Abs 0.9 (L) 1.0 - 3.6 K/uL   Monocytes Relative 16 %   Monocytes Absolute 0.6 0.2 - 1.0 K/uL   Eosinophils Relative 4 %   Eosinophils Absolute 0.1 0 - 0.7 K/uL   Basophils Relative 0 %   Basophils Absolute 0.0 0 - 0.1 K/uL    Comment: Performed at [facility], [address] [place], [place] [address]  CBC with Differential     Status: Abnormal   Collection Time: 11/19/17 10:04 AM  Result Value Ref Range   WBC 5.2 3.8 - 10.6 K/uL   RBC 4.83 4.40 - 5.90 MIL/uL   Hemoglobin 13.2 13.0 - 18.0 g/dL   HCT 39.6 (L) 40.0 - 52.0 %   MCV  81.8 80.0 - 100.0 fL   MCH 27.3 26.0 - 34.0 pg   MCHC 33.3 32.0 - 36.0 g/dL   RDW 17.6 (H) 11.5 - 14.5 %   Platelets 277 150 - 440 K/uL   Neutrophils Relative % 56 %   Neutro Abs 3.0 1.4 - 6.5 K/uL   Lymphocytes Relative 23 %   Lymphs Abs 1.2 1.0 - 3.6 K/uL   Monocytes Relative 15 %   Monocytes Absolute 0.8 0.2 - 1.0 K/uL   Eosinophils Relative 5 %   Eosinophils Absolute 0.2 0 - 0.7 K/uL   Basophils Relative 1 %   Basophils Absolute 0.0 0 - 0.1 K/uL    Comment: Performed at [facility], [address] [place], [place] [address]  Comprehensive  metabolic panel     Status: Abnormal   Collection Time: 12/17/17 10:00 AM  Result Value Ref Range   Sodium 132 (L) 135 - 145 mmol/L   Potassium 4.6 3.5 - 5.1 mmol/L   Chloride 106 101 - 111 mmol/L   CO2 18 (L) 22 - 32 mmol/L   Glucose, Bld 168 (H) 65 - 99 mg/dL   BUN 19 6 - 20 mg/dL   Creatinine, Ser 1.45 (H) 0.61 - 1.24 mg/dL   Calcium 8.6 (L) 8.9 - 10.3 mg/dL   Total Protein 6.5 6.5 - 8.1 g/dL   Albumin 3.0 (L) 3.5 - 5.0 g/dL   AST 26 15 - 41 U/L   ALT 43 17 - 63 U/L   Alkaline Phosphatase 99 38 - 126 U/L   Total Bilirubin 0.4 0.3 - 1.2 mg/dL   GFR calc non Af Amer 49 (L) >60 mL/min   GFR calc Af Amer 57 (L) >60 mL/min    Comment: (NOTE) The eGFR has been calculated using the CKD EPI equation. This calculation has not been validated in all clinical situations. eGFR's persistently <60 mL/min signify possible Chronic Kidney Disease.    Anion gap 8 5 - 15    Comment: Performed at [facility], [address] [place], [place] [address]  CBC with Differential/Platelet     Status: Abnormal   Collection Time: 12/17/17 10:00 AM  Result Value Ref Range   WBC 4.0 3.8 - 10.6 K/uL   RBC 4.77 4.40 - 5.90 MIL/uL   Hemoglobin 13.0 13.0 - 18.0 g/dL   HCT 39.0 (L) 40.0 - 52.0 %   MCV 81.7 80.0 - 100.0 fL   MCH 27.3 26.0 - 34.0 pg   MCHC 33.4 32.0 - 36.0 g/dL   RDW 17.2 (H) 11.5 - 14.5 %   Platelets 260 150 - 440 K/uL   Neutrophils Relative % 46 %   Neutro Abs 1.9 1.4 - 6.5 K/uL   Lymphocytes Relative 30 %   Lymphs Abs 1.2 1.0 - 3.6 K/uL   Monocytes Relative 17 %   Monocytes Absolute 0.7 0.2 - 1.0 K/uL   Eosinophils Relative 6 %   Eosinophils Absolute 0.2 0 - 0.7 K/uL   Basophils Relative 1 %   Basophils Absolute 0.0 0 - 0.1 K/uL    Comment: Performed at [facility], [address] [place], [place] [address]  POCT HgB A1C     Status: Abnormal   Collection Time: 01/05/18 10:22 AM  Result Value Ref Range   Hemoglobin A1C 8.2   POCT UA - Microalbumin     Status:  None   Collection Time: 01/05/18 10:22 AM  Result Value Ref Range   Microalbumin Ur,  POC 20 mg/L   Creatinine, POC  mg/dL   Albumin/Creatinine Ratio, Urine, POC      Diabetic Foot Exam: Diabetic Foot Exam - Simple   Simple Foot Form Diabetic Foot exam was performed with the following findings:  Yes 01/05/2018 11:09 AM  Visual Inspection See comments:  Yes Sensation Testing Intact to touch and monofilament testing bilaterally:  Yes Pulse Check Posterior Tibialis and Dorsalis pulse intact bilaterally:  Yes Comments Thick and brittle first toenails      PHQ2/9: Depression screen [facility] 2/9 01/05/2018 10/01/2017 08/19/2017 07/02/2017 04/01/2017  Decreased Interest 1 0 0 0 0  Down, Depressed, Hopeless 0 0 0 0 0  PHQ - 2 Score 1 0 0 0 0  Altered sleeping 0 - - - -  Tired, decreased energy 1 - - - -  Change in appetite 1 - - - -  Trouble concentrating 0 - - - -  Moving slowly or fidgety/restless 0 - - - -  Suicidal thoughts 0 - - - -  PHQ-9 Score 3 - - - -  Difficult doing work/chores Not difficult at all - - - -    Fall Risk: Fall Risk  01/05/2018 10/01/2017 08/19/2017 07/02/2017 05/31/2017  Falls in the past year? No No No No No     Functional Status Survey: Is the patient deaf or have difficulty hearing?: No Does the patient have difficulty seeing, even when wearing glasses/contacts?: No Does the patient have difficulty concentrating, remembering, or making decisions?: No Does the patient have difficulty walking or climbing stairs?: No Does the patient have difficulty dressing or bathing?: No Does the patient have difficulty doing errands alone such as visiting a doctor's office or shopping?: No   Assessment & Plan  1. Controlled type 2 diabetes mellitus with stage 3 chronic kidney disease, without long-term current use of insulin (HCC)  - POCT HgB A1C, resume diet - POCT UA - Microalbumin - metFORMIN (GLUCOPHAGE) 500 MG tablet; Take 1 tablet (500 mg total) by mouth 2 (two)  times daily with a meal.  Dispense: 180 tablet; Refill: 1 - glipiZIDE (GLUCOTROL) 5 MG tablet; Take 0.5 tablets (2.5 mg total) by mouth 2 (two) times daily before a meal.  Dispense: 180 tablet; Refill: 0  2. Hypercholesteremia  - atorvastatin (LIPITOR) 40 MG tablet; Take 1 tablet (40 mg total) by mouth 3 (three) times a week.  Dispense: 48 tablet; Refill: 1  3. Chronic deep vein thrombosis (DVT) of left lower extremity, unspecified vein (HCC)  Stable   4. Primary cancer of left upper lobe of lung ([place])   5. Primary malignant neoplasm of lung metastatic to other site, unspecified laterality [facility])  Keep follow up with oncologist   6. Old cerebrovascular accident (CVA) without late effect  Stable   7. CKD (chronic kidney disease), stage III (HCC)  - lisinopril (PRINIVIL,ZESTRIL) 10 MG tablet; Take 1 tablet (10 mg total) by mouth daily.  Dispense: 90 tablet; Refill: 0

## 2018-01-06 ENCOUNTER — Telehealth: Payer: Self-pay | Admitting: Pharmacist

## 2018-01-06 ENCOUNTER — Telehealth: Payer: Self-pay | Admitting: *Deleted

## 2018-01-06 NOTE — Telephone Encounter (Signed)
Oral Chemotherapy Pharmacist Encounter   There is NO interaction between Xalkori and lisinopril. I called Melvin Willis and let him know.   Darl Pikes, PharmD, BCPS Hematology/Oncology Clinical Pharmacist [facility] [phone]  01/06/2018 3:48 PM

## 2018-01-06 NOTE — Telephone Encounter (Signed)
Oral Chemotherapy Pharmacist Encounter   Met with patient in lobby so that he could sign his Xalkori patient assistance application. He currently receives his medication for free from [employer] and it is time for him to reenroll in the program. He also brought with him his federal taxes. A copy of was made of his 1040, this has to be sent in with his application.  Application was faxed to [employer].   Darl Pikes, PharmD, BCPS Hematology/Oncology Clinical Pharmacist [facility] [phone]  01/06/2018 12:44 PM

## 2018-01-06 NOTE — Telephone Encounter (Signed)
Patient started on Lisinopril. Patient calling to make sure that this medications would not interfere with the current treatment plan.

## 2018-01-06 NOTE — Telephone Encounter (Signed)
-----   Message from Wilburn Cornelia sent at 01/06/2018  1:25 PM EDT ----- Regarding: Lisinopril Contact: [phone] Pt called and stated PCP put him on this RX- called to see if this is OK???

## 2018-01-14 ENCOUNTER — Other Ambulatory Visit: Payer: Self-pay

## 2018-01-14 ENCOUNTER — Inpatient Hospital Stay (HOSPITAL_BASED_OUTPATIENT_CLINIC_OR_DEPARTMENT_OTHER): Payer: Self-pay | Admitting: Internal Medicine

## 2018-01-14 ENCOUNTER — Inpatient Hospital Stay: Payer: Self-pay | Attending: Internal Medicine

## 2018-01-14 ENCOUNTER — Encounter: Payer: Self-pay | Admitting: Internal Medicine

## 2018-01-14 VITALS — BP 117/76 | HR 64 | Temp 98.5°F | Resp 18 | Ht 64.0 in | Wt 166.0 lb

## 2018-01-14 DIAGNOSIS — C7800 Secondary malignant neoplasm of unspecified lung: Secondary | ICD-10-CM

## 2018-01-14 DIAGNOSIS — Z8249 Family history of ischemic heart disease and other diseases of the circulatory system: Secondary | ICD-10-CM

## 2018-01-14 DIAGNOSIS — M7989 Other specified soft tissue disorders: Secondary | ICD-10-CM | POA: Insufficient documentation

## 2018-01-14 DIAGNOSIS — C3412 Malignant neoplasm of upper lobe, left bronchus or lung: Secondary | ICD-10-CM | POA: Insufficient documentation

## 2018-01-14 DIAGNOSIS — Z7901 Long term (current) use of anticoagulants: Secondary | ICD-10-CM | POA: Insufficient documentation

## 2018-01-14 DIAGNOSIS — I129 Hypertensive chronic kidney disease with stage 1 through stage 4 chronic kidney disease, or unspecified chronic kidney disease: Secondary | ICD-10-CM | POA: Insufficient documentation

## 2018-01-14 DIAGNOSIS — Z923 Personal history of irradiation: Secondary | ICD-10-CM

## 2018-01-14 DIAGNOSIS — Z86711 Personal history of pulmonary embolism: Secondary | ICD-10-CM

## 2018-01-14 DIAGNOSIS — N183 Chronic kidney disease, stage 3 (moderate): Secondary | ICD-10-CM

## 2018-01-14 DIAGNOSIS — E1122 Type 2 diabetes mellitus with diabetic chronic kidney disease: Secondary | ICD-10-CM | POA: Insufficient documentation

## 2018-01-14 DIAGNOSIS — Z7984 Long term (current) use of oral hypoglycemic drugs: Secondary | ICD-10-CM | POA: Insufficient documentation

## 2018-01-14 DIAGNOSIS — Z9221 Personal history of antineoplastic chemotherapy: Secondary | ICD-10-CM

## 2018-01-14 DIAGNOSIS — Z79899 Other long term (current) drug therapy: Secondary | ICD-10-CM | POA: Insufficient documentation

## 2018-01-14 LAB — CBC WITH DIFFERENTIAL/PLATELET
BASOS ABS: 0 10*3/uL (ref 0–0.1)
Basophils Relative: 1 %
Eosinophils Absolute: 0.3 10*3/uL (ref 0–0.7)
Eosinophils Relative: 5 %
HEMATOCRIT: 40.3 % (ref 40.0–52.0)
HEMOGLOBIN: 13.3 g/dL (ref 13.0–18.0)
LYMPHS PCT: 29 %
Lymphs Abs: 1.5 10*3/uL (ref 1.0–3.6)
MCH: 27.2 pg (ref 26.0–34.0)
MCHC: 32.9 g/dL (ref 32.0–36.0)
MCV: 82.6 fL (ref 80.0–100.0)
MONO ABS: 0.8 10*3/uL (ref 0.2–1.0)
Monocytes Relative: 16 %
NEUTROS ABS: 2.6 10*3/uL (ref 1.4–6.5)
NEUTROS PCT: 49 %
Platelets: 260 10*3/uL (ref 150–440)
RBC: 4.88 MIL/uL (ref 4.40–5.90)
RDW: 17.9 % — AB (ref 11.5–14.5)
WBC: 5.3 10*3/uL (ref 3.8–10.6)

## 2018-01-14 LAB — COMPREHENSIVE METABOLIC PANEL
ALK PHOS: 99 U/L (ref 38–126)
ALT: 44 U/L (ref 17–63)
AST: 27 U/L (ref 15–41)
Albumin: 3.1 g/dL — ABNORMAL LOW (ref 3.5–5.0)
Anion gap: 10 (ref 5–15)
BILIRUBIN TOTAL: 0.7 mg/dL (ref 0.3–1.2)
BUN: 17 mg/dL (ref 6–20)
CALCIUM: 8.6 mg/dL — AB (ref 8.9–10.3)
CO2: 20 mmol/L — ABNORMAL LOW (ref 22–32)
CREATININE: 1.46 mg/dL — AB (ref 0.61–1.24)
Chloride: 105 mmol/L (ref 101–111)
GFR calc Af Amer: 57 mL/min — ABNORMAL LOW (ref >60)
GFR, EST NON AFRICAN AMERICAN: 49 mL/min — AB (ref >60)
GLUCOSE: 147 mg/dL — AB (ref 65–99)
POTASSIUM: 4.2 mmol/L (ref 3.5–5.1)
Sodium: 135 mmol/L (ref 135–145)
TOTAL PROTEIN: 6.5 g/dL (ref 6.5–8.1)

## 2018-01-14 NOTE — Progress Notes (Signed)
[facility] OFFICE PROGRESS NOTE  Patient Care Team: [name], MD as PCP - General (Family Medicine)  Cancer Staging No matching staging information was found for the patient.   Oncology History   # OCT 2017- ADENO CA LUNG; STAGE IV [; LUL; bil supraclavicular LN; Left neck LN Bx]; ROS-1 MUTATED; s/p Carbo-alimta x1[oct 2017]  # NOV 1st 2017- XALKORI 250 mg BID; JAN 15th CT- PR;  # AUG 27th Chemo-RT to persistent LUL/mediastinal LN [s/p carbo-taxol- with RT; finished Sep 22nd 2018]  # LLE DVT/bil PE/Multiple strokes [? On xarelto]-Lovenox; Jan mid 2018- xarelto [lovenox-insurance issues]  # MRI brain- multiple infarcts [2d echo/bubble study-NEG's/p Neurology eval]  # # s/p TURP [Sep 2017; Dr.Cope] DEC 26th CT- distended bladder   # MOLECULAR TESTING- ROS-1 POSITIVE; ALK/EGFR-NEG; PDL-1 EXPRESSION- 90%** [HIGH]     Primary cancer of left upper lobe of lung ([place])      INTERVAL HISTORY:  Melvin Willis 67 y.o.  male pleasant patient above history of adenocarcinoma of the lung ROS-mutated currently on crizotinib is here for follow-up.   Patient denies any worsening shortness of breath or cough.  Complains of mild fatigue.  Chronic mild swelling in the legs.  Not any worse.    Review of Systems  Constitutional: Negative for chills, diaphoresis, fever, malaise/fatigue and weight loss.  HENT: Negative for nosebleeds and sore throat.   Eyes: Negative for double vision.  Respiratory: Positive for shortness of breath. Negative for cough, hemoptysis, sputum production and wheezing.   Cardiovascular: Positive for leg swelling. Negative for chest pain, palpitations and orthopnea.  Gastrointestinal: Negative for abdominal pain, blood in stool, constipation, diarrhea, heartburn, melena, nausea and vomiting.  Genitourinary: Negative for dysuria, frequency and urgency.  Musculoskeletal: Negative for back pain and joint pain.  Skin: Negative.  Negative for  itching and rash.  Neurological: Negative for dizziness, tingling, focal weakness, weakness and headaches.  Endo/Heme/Allergies: Does not bruise/bleed easily.  Psychiatric/Behavioral: Negative for depression. The patient is not nervous/anxious and does not have insomnia.       PAST MEDICAL HISTORY :  Past Medical History:  Diagnosis Date  . Abnormal prostate specific antigen 08/08/2012  . Adiposity 04/16/2015  . Chronic kidney disease (CKD), stage III (moderate) ([place]) 11/27/2016  . CVA (cerebral vascular accident) ([place]) 06/17/2016  . Diabetes mellitus without complication ([place])   . Diverticulosis of sigmoid colon 04/16/2015  . Dyslipidemia 03/18/2015  . Hemorrhoids, internal 04/16/2015  . Hypercholesteremia 04/16/2015  . Hyperlipidemia   . Hypertension   . Primary cancer of left upper lobe of lung ([place])   . Pulmonary embolism ([place])   . Wears dentures    partial upper    PAST SURGICAL HISTORY :   Past Surgical History:  Procedure Laterality Date  . COLONOSCOPY    . COLONOSCOPY WITH PROPOFOL N/A 05/06/2015   Procedure: COLONOSCOPY WITH PROPOFOL;  Surgeon: Lucilla Lame, MD;  Location: [facility];  Service: Endoscopy;  Laterality: N/A;  ASCENDING COLON POLYPS X 2 TERMINAL ILEUM BIOPSY RANDOM COLON BX. TRANSVERSE COLON POLYP SIGMOID COLON POLYP  . ESOPHAGOGASTRODUODENOSCOPY (EGD) WITH PROPOFOL N/A 05/06/2015   Procedure: ESOPHAGOGASTRODUODENOSCOPY (EGD) WITH PROPOFOL;  Surgeon: Lucilla Lame, MD;  Location: [facility];  Service: Endoscopy;  Laterality: N/A;  GASTRIC BIOPSY X1  . KIDNEY STONE SURGERY  2017    FAMILY HISTORY :   Family History  Problem Relation Age of Onset  . Diabetes Mother   . Diabetes Father   . CAD Father   .  Dementia Father   . Diabetes Sister   . Cancer Maternal Uncle        Prostate  . Cancer Cousin        prostate    SOCIAL HISTORY:   Social History   Tobacco Use  . Smoking status: Never Smoker  . Smokeless tobacco: Never Used   Substance Use Topics  . Alcohol use: No    Alcohol/week: 0.0 oz  . Drug use: No    ALLERGIES:  has No Known Allergies.  MEDICATIONS:  Current Outpatient Medications  Medication Sig Dispense Refill  . atorvastatin (LIPITOR) 40 MG tablet Take 1 tablet (40 mg total) by mouth 3 (three) times a week. 48 tablet 1  . crizotinib (XALKORI) 250 MG capsule Take 1 capsule (250 mg total) by mouth 2 (two) times daily. 60 capsule 3  . finasteride (PROSCAR) 5 MG tablet Take 1 tablet (5 mg total) by mouth at bedtime. 14 tablet 0  . glipiZIDE (GLUCOTROL) 5 MG tablet Take 0.5 tablets (2.5 mg total) by mouth 2 (two) times daily before a meal. 180 tablet 0  . lisinopril (PRINIVIL,ZESTRIL) 10 MG tablet Take 1 tablet (10 mg total) by mouth daily. 90 tablet 0  . metFORMIN (GLUCOPHAGE) 500 MG tablet Take 1 tablet (500 mg total) by mouth 2 (two) times daily with a meal. 180 tablet 1  . rivaroxaban (XARELTO) 20 MG TABS tablet Take 1 tablet (20 mg total) by mouth daily with supper. 30 tablet 11  . tamsulosin (FLOMAX) 0.4 MG CAPS capsule Take 0.4 mg by mouth every morning.      No current facility-administered medications for this visit.     PHYSICAL EXAMINATION: ECOG PERFORMANCE STATUS: 1 - Symptomatic but completely ambulatory  BP 117/76   Pulse 64   Temp 98.5 F (36.9 C) (Oral)   Resp 18   Ht _0  (1.626 m)   Wt 166 lb (75.3 kg)   BMI 28.49 kg/m   Filed Weights   01/14/18 1009  Weight: 166 lb (75.3 kg)    GENERAL: Well-nourished well-developed; Alert, no distress and comfortable.  Alone. EYES: no pallor or icterus OROPHARYNX: no thrush or ulceration; NECK: supple; no lymph nodes felt. LYMPH:  no palpable lymphadenopathy in the axillary or inguinal regions LUNGS: Decreased breath sounds auscultation bilaterally. No wheeze or crackles HEART/CVS: regular rate & rhythm and no murmurs; No lower extremity edema ABDOMEN:abdomen soft, non-tender and normal bowel sounds. No hepatomegaly or  splenomegaly.  Musculoskeletal:no cyanosis of digits and no clubbing  PSYCH: alert & oriented x 3 with fluent speech NEURO: no focal motor/sensory deficits SKIN:  no rashes or significant lesions    LABORATORY DATA:  I have reviewed the data as listed    Component Value Date/Time   NA 135 01/14/2018 0950   NA 139 01/01/2016 1000   K 4.2 01/14/2018 0950   CL 105 01/14/2018 0950   CO2 20 (L) 01/14/2018 0950   GLUCOSE 147 (H) 01/14/2018 0950   BUN 17 01/14/2018 0950   BUN 13 01/01/2016 1000   CREATININE 1.46 (H) 01/14/2018 0950   CREATININE 1.57 (H) 01/04/2017 0933   CALCIUM 8.6 (L) 01/14/2018 0950   PROT 6.5 01/14/2018 0950   PROT 7.2 01/01/2016 1000   ALBUMIN 3.1 (L) 01/14/2018 0950   ALBUMIN 4.4 01/01/2016 1000   AST 27 01/14/2018 0950   ALT 44 01/14/2018 0950   ALKPHOS 99 01/14/2018 0950   BILITOT 0.7 01/14/2018 0950   BILITOT 0.4 01/01/2016 1000  GFRNONAA 49 (L) 01/14/2018 0950   GFRNONAA 46 (L) 01/04/2017 0933   GFRAA 57 (L) 01/14/2018 0950   GFRAA 53 (L) 01/04/2017 0933    No results found for: SPEP, UPEP  Lab Results  Component Value Date   WBC 5.3 01/14/2018   NEUTROABS 2.6 01/14/2018   HGB 13.3 01/14/2018   HCT 40.3 01/14/2018   MCV 82.6 01/14/2018   PLT 260 01/14/2018      Chemistry      Component Value Date/Time   NA 135 01/14/2018 0950   NA 139 01/01/2016 1000   K 4.2 01/14/2018 0950   CL 105 01/14/2018 0950   CO2 20 (L) 01/14/2018 0950   BUN 17 01/14/2018 0950   BUN 13 01/01/2016 1000   CREATININE 1.46 (H) 01/14/2018 0950   CREATININE 1.57 (H) 01/04/2017 0933      Component Value Date/Time   CALCIUM 8.6 (L) 01/14/2018 0950   ALKPHOS 99 01/14/2018 0950   AST 27 01/14/2018 0950   ALT 44 01/14/2018 0950   BILITOT 0.7 01/14/2018 0950   BILITOT 0.4 01/01/2016 1000       RADIOGRAPHIC STUDIES: I have personally reviewed the radiological images as listed and agreed with the findings in the report. No results found.   ASSESSMENT & PLAN:   Primary cancer of left upper lobe of lung ([place]) # Adenocarcinoma of the lung metastatic/stage IV-ROS-1 positive: CT scan August 20, 2017-left upper lobe radiation changes no progressive disease.    # Continue crizotinib 250 mg twice a day; no clinical evidence of progression.  Tolerating well.  Patient reluctant to have scans because of insurance issues.  # CKD- multifactorial [prostatitic obstruction/diabetes]-creatinine 1.3-1.4 stable.  # Bil LE swelling: CKD/X crizotinib compression stockings.  Stable   # Bilateral PE & left lower extremity DVT: Stable on Xarelto.  #Follow-up:  RTC in 4 weeks/labs/MD; chest x-ray prior.    Orders Placed This Encounter  Procedures  . DG Chest 2 View    Standing Status:   Future    Standing Expiration Date:   03/16/2019    Order Specific Question:   Reason for Exam (SYMPTOM  OR DIAGNOSIS REQUIRED)    Answer:   lung cancer    Order Specific Question:   Preferred imaging location?    Answer:   [facility]   All questions were answered. The patient knows to call the clinic with any problems, questions or concerns.      Cammie Sickle, MD 01/14/2018 7:45 PM

## 2018-01-14 NOTE — Assessment & Plan Note (Addendum)
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    # Continue crizotinib 250 mg twice a day; no clinical evidence of progression.  Tolerating well.  Patient reluctant to have scans because of insurance issues.  # CKD- multifactorial [prostatitic obstruction/diabetes]-creatinine 1.3-1.4 stable.  # Bil LE swelling: CKD/X crizotinib compression stockings.  Stable   # Bilateral PE & left lower extremity DVT: Stable on Xarelto.  #Follow-up:  RTC in 4 weeks/labs/MD; chest x-ray prior.

## 2018-01-25 NOTE — Telephone Encounter (Signed)
Oral Chemotherapy Pharmacist Encounter  Successfully re-enrolled in manufacturer assistance for Xalkori through [employer].  Approval dates: 01/13/18-01/14/19  I will place a copy of the approval letter to be scanned into patient's chart.  Darl Pikes, PharmD, BCPS Hematology/Oncology Clinical Pharmacist [facility] [phone]  01/25/2018 3:49 PM

## 2018-02-10 ENCOUNTER — Ambulatory Visit
Admission: RE | Admit: 2018-02-10 | Discharge: 2018-02-10 | Disposition: A | Discharge status: Home / Self Care | Payer: Self-pay | Source: Ambulatory Visit | Attending: Internal Medicine | Admitting: Internal Medicine

## 2018-02-10 DIAGNOSIS — C3412 Malignant neoplasm of upper lobe, left bronchus or lung: Secondary | ICD-10-CM

## 2018-02-11 ENCOUNTER — Inpatient Hospital Stay: Payer: Self-pay

## 2018-02-11 ENCOUNTER — Inpatient Hospital Stay: Payer: Self-pay | Attending: Nurse Practitioner

## 2018-02-11 ENCOUNTER — Encounter: Payer: Self-pay | Admitting: Nurse Practitioner

## 2018-02-11 ENCOUNTER — Inpatient Hospital Stay (HOSPITAL_BASED_OUTPATIENT_CLINIC_OR_DEPARTMENT_OTHER): Payer: Self-pay | Admitting: Nurse Practitioner

## 2018-02-11 ENCOUNTER — Encounter: Payer: Self-pay | Admitting: *Deleted

## 2018-02-11 VITALS — BP 149/84 | HR 76 | Temp 97.1°F | Resp 16 | Wt 166.6 lb

## 2018-02-11 DIAGNOSIS — Z79899 Other long term (current) drug therapy: Secondary | ICD-10-CM

## 2018-02-11 DIAGNOSIS — N183 Chronic kidney disease, stage 3 (moderate): Secondary | ICD-10-CM

## 2018-02-11 DIAGNOSIS — Z86718 Personal history of other venous thrombosis and embolism: Secondary | ICD-10-CM

## 2018-02-11 DIAGNOSIS — Z86711 Personal history of pulmonary embolism: Secondary | ICD-10-CM

## 2018-02-11 DIAGNOSIS — J069 Acute upper respiratory infection, unspecified: Secondary | ICD-10-CM

## 2018-02-11 DIAGNOSIS — Z7901 Long term (current) use of anticoagulants: Secondary | ICD-10-CM

## 2018-02-11 DIAGNOSIS — Z598 Other problems related to housing and economic circumstances: Secondary | ICD-10-CM

## 2018-02-11 DIAGNOSIS — C7800 Secondary malignant neoplasm of unspecified lung: Secondary | ICD-10-CM

## 2018-02-11 DIAGNOSIS — R530 Neoplastic (malignant) related fatigue: Secondary | ICD-10-CM

## 2018-02-11 DIAGNOSIS — R0602 Shortness of breath: Secondary | ICD-10-CM

## 2018-02-11 DIAGNOSIS — Z7984 Long term (current) use of oral hypoglycemic drugs: Secondary | ICD-10-CM | POA: Insufficient documentation

## 2018-02-11 DIAGNOSIS — C3412 Malignant neoplasm of upper lobe, left bronchus or lung: Secondary | ICD-10-CM

## 2018-02-11 DIAGNOSIS — I82502 Chronic embolism and thrombosis of unspecified deep veins of left lower extremity: Secondary | ICD-10-CM

## 2018-02-11 DIAGNOSIS — Z5989 Other problems related to housing and economic circumstances: Secondary | ICD-10-CM

## 2018-02-11 DIAGNOSIS — E1122 Type 2 diabetes mellitus with diabetic chronic kidney disease: Secondary | ICD-10-CM | POA: Insufficient documentation

## 2018-02-11 LAB — CBC WITH DIFFERENTIAL/PLATELET
BASOS ABS: 0 10*3/uL (ref 0–0.1)
Basophils Relative: 1 %
EOS PCT: 3 %
Eosinophils Absolute: 0.1 10*3/uL (ref 0–0.7)
HCT: 38.5 % — ABNORMAL LOW (ref 40.0–52.0)
Hemoglobin: 12.6 g/dL — ABNORMAL LOW (ref 13.0–18.0)
LYMPHS PCT: 27 %
Lymphs Abs: 1.4 10*3/uL (ref 1.0–3.6)
MCH: 27.1 pg (ref 26.0–34.0)
MCHC: 32.8 g/dL (ref 32.0–36.0)
MCV: 82.6 fL (ref 80.0–100.0)
Monocytes Absolute: 0.7 10*3/uL (ref 0.2–1.0)
Monocytes Relative: 14 %
NEUTROS ABS: 3 10*3/uL (ref 1.4–6.5)
NEUTROS PCT: 55 %
PLATELETS: 284 10*3/uL (ref 150–440)
RBC: 4.66 MIL/uL (ref 4.40–5.90)
RDW: 17 % — ABNORMAL HIGH (ref 11.5–14.5)
WBC: 5.3 10*3/uL (ref 3.8–10.6)

## 2018-02-11 MED ORDER — ALBUTEROL SULFATE HFA 108 (90 BASE) MCG/ACT IN AERS: 1.0000 | INHALATION_SPRAY | Freq: Four times a day (QID) | RESPIRATORY_TRACT | 2 refills | Status: DC | PRN

## 2018-02-11 MED ORDER — ALBUTEROL SULFATE (2.5 MG/3ML) 0.083% IN NEBU
2.5000 mg | INHALATION_SOLUTION | Freq: Once | RESPIRATORY_TRACT | Status: AC
  Administered 2018-02-11: 2.5 mg via RESPIRATORY_TRACT
  Filled 2018-02-11: qty 3

## 2018-02-11 MED ORDER — AZITHROMYCIN 250 MG PO TABS
ORAL_TABLET | ORAL | 0 refills | Status: DC
Start: 2018-02-11 — End: 2018-04-07

## 2018-02-11 NOTE — Patient Instructions (Signed)
Azithromycin tablets What is this medicine? AZITHROMYCIN (az ith roe MYE sin) is a macrolide antibiotic. It is used to treat or prevent certain kinds of bacterial infections. It will not work for colds, flu, or other viral infections. This medicine may be used for other purposes; ask your health care provider or pharmacist if you have questions. COMMON BRAND NAME(S): Zithromax, Zithromax Tri-Pak, Zithromax Z-Pak What should I tell my health care provider before I take this medicine? They need to know if you have any of these conditions: -kidney disease -liver disease -irregular heartbeat or heart disease -an unusual or allergic reaction to azithromycin, erythromycin, other macrolide antibiotics, foods, dyes, or preservatives -pregnant or trying to get pregnant -breast-feeding How should I use this medicine? Take this medicine by mouth with a full glass of water. Follow the directions on the prescription label. The tablets can be taken with food or on an empty stomach. If the medicine upsets your stomach, take it with food. Take your medicine at regular intervals. Do not take your medicine more often than directed. Take all of your medicine as directed even if you think your are better. Do not skip doses or stop your medicine early. Talk to your pediatrician regarding the use of this medicine in children. While this drug may be prescribed for children as young as 6 months for selected conditions, precautions do apply. Overdosage: If you think you have taken too much of this medicine contact a poison control center or emergency room at once. NOTE: This medicine is only for you. Do not share this medicine with others. What if I miss a dose? If you miss a dose, take it as soon as you can. If it is almost time for your next dose, take only that dose. Do not take double or extra doses. What may interact with this medicine? Do not take this medicine with any of the following  medications: -lincomycin This medicine may also interact with the following medications: -amiodarone -antacids -birth control pills -cyclosporine -digoxin -magnesium -nelfinavir -phenytoin -warfarin This list may not describe all possible interactions. Give your health care provider a list of all the medicines, herbs, non-prescription drugs, or dietary supplements you use. Also tell them if you smoke, drink alcohol, or use illegal drugs. Some items may interact with your medicine. What should I watch for while using this medicine? Tell your doctor or healthcare professional if your symptoms do not start to get better or if they get worse. Do not treat diarrhea with over the counter products. Contact your doctor if you have diarrhea that lasts more than 2 days or if it is severe and watery. This medicine can make you more sensitive to the sun. Keep out of the sun. If you cannot avoid being in the sun, wear protective clothing and use sunscreen. Do not use sun lamps or tanning beds/booths. What side effects may I notice from receiving this medicine? Side effects that you should report to your doctor or health care professional as soon as possible: -allergic reactions like skin rash, itching or hives, swelling of the face, lips, or tongue -confusion, nightmares or hallucinations -dark urine -difficulty breathing -hearing loss -irregular heartbeat or chest pain -pain or difficulty passing urine -redness, blistering, peeling or loosening of the skin, including inside the mouth -white patches or sores in the mouth -yellowing of the eyes or skin Side effects that usually do not require medical attention (report to your doctor or health care professional if they continue or are bothersome): -  diarrhea -dizziness, drowsiness -headache -stomach upset or vomiting -tooth discoloration -vaginal irritation This list may not describe all possible side effects. Call your doctor for medical advice  about side effects. You may report side effects to FDA at 1-800-FDA-1088. Where should I keep my medicine? Keep out of the reach of children. Store at room temperature between 15 and 30 degrees C (59 and 86 degrees F). Throw away any unused medicine after the expiration date. NOTE: This sheet is a summary. It may not cover all possible information. If you have questions about this medicine, talk to your doctor, pharmacist, or health care provider.  2018 Elsevier/Gold Standard (2015-10-22 15:26:03) Albuterol inhalation aerosol What is this medicine? ALBUTEROL (al [place]ean [place]) is a bronchodilator. It helps open up the airways in your lungs to make it easier to breathe. This medicine is used to treat and to prevent bronchospasm. This medicine may be used for other purposes; ask your health care provider or pharmacist if you have questions. COMMON BRAND NAME(S): Proair HFA, Proventil, Proventil HFA, Respirol, Ventolin, Ventolin HFA What should I tell my health care provider before I take this medicine? They need to know if you have any of the following conditions: -diabetes -heart disease or irregular heartbeat -high blood pressure -pheochromocytoma -seizures -thyroid disease -an unusual or allergic reaction to albuterol, levalbuterol, sulfites, other medicines, foods, dyes, or preservatives -pregnant or trying to get pregnant -breast-feeding How should I use this medicine? This medicine is for inhalation through the mouth. Follow the directions on your prescription label. Take your medicine at regular intervals. Do not use more often than directed. Make sure that you are using your inhaler correctly. Ask you doctor or health care provider if you have any questions. Talk to your pediatrician regarding the use of this medicine in children. Special care may be needed. Overdosage: If you think you have taken too much of this medicine contact a poison control center or emergency room at  once. NOTE: This medicine is only for you. Do not share this medicine with others. What if I miss a dose? If you miss a dose, use it as soon as you can. If it is almost time for your next dose, use only that dose. Do not use double or extra doses. What may interact with this medicine? -anti-infectives like chloroquine and pentamidine -caffeine -cisapride -diuretics -medicines for colds -medicines for depression or for emotional or psychotic conditions -medicines for weight loss including some herbal products -methadone -some antibiotics like clarithromycin, erythromycin, levofloxacin, and linezolid -some heart medicines -steroid hormones like dexamethasone, cortisone, hydrocortisone -theophylline -thyroid hormones This list may not describe all possible interactions. Give your health care provider a list of all the medicines, herbs, non-prescription drugs, or dietary supplements you use. Also tell them if you smoke, drink alcohol, or use illegal drugs. Some items may interact with your medicine. What should I watch for while using this medicine? Tell your doctor or health care professional if your symptoms do not improve. Do not use extra albuterol. If your asthma or bronchitis gets worse while you are using this medicine, call your doctor right away. If your mouth gets dry try chewing sugarless gum or sucking hard candy. Drink water as directed. What side effects may I notice from receiving this medicine? Side effects that you should report to your doctor or health care professional as soon as possible: -allergic reactions like skin rash, itching or hives, swelling of the face, lips, or tongue -breathing problems -chest pain -feeling  faint or lightheaded, falls -high blood pressure -irregular heartbeat -fever -muscle cramps or weakness -pain, tingling, numbness in the hands or feet -vomiting Side effects that usually do not require medical attention (report to your doctor or health  care professional if they continue or are bothersome): -cough -difficulty sleeping -headache -nervousness or trembling -stomach upset -stuffy or runny nose -throat irritation -unusual taste This list may not describe all possible side effects. Call your doctor for medical advice about side effects. You may report side effects to FDA at 1-800-FDA-1088. Where should I keep my medicine? Keep out of the reach of children. Store at room temperature between 15 and 30 degrees C (59 and 86 degrees F). The contents are under pressure and may burst when exposed to heat or flame. Do not freeze. This medicine does not work as well if it is too cold. Throw away any unused medicine after the expiration date. Inhalers need to be thrown away after the labeled number of puffs have been used or by the expiration date; whichever comes first. Ventolin HFA should be thrown away 12 months after removing from foil pouch. Check the instructions that come with your medicine. NOTE: This sheet is a summary. It may not cover all possible information. If you have questions about this medicine, talk to your doctor, pharmacist, or health care provider.  2018 Elsevier/Gold Standard (2013-02-09 10:57:17)

## 2018-02-11 NOTE — Progress Notes (Signed)
[place] OFFICE PROGRESS NOTE  Patient Care Team: [name], [name], MD as Medical Oncologist (Medical Oncology)  Cancer Staging No matching staging information was found for the patient.   Oncology History   # OCT 2017- ADENO CA LUNG; STAGE IV [; LUL; bil supraclavicular LN; Left neck LN Bx]; ROS-1 MUTATED; s/p Carbo-alimta x1[oct 2017]  # NOV 1st 2017- XALKORI 250 mg BID; JAN 15th CT- PR;  # AUG 27th Chemo-RT to persistent LUL/mediastinal LN [s/p carbo-taxol- with RT; finished Sep 22nd 2018]  # LLE DVT/bil PE/Multiple strokes [? On xarelto]-Lovenox; Jan mid 2018- xarelto [lovenox-insurance issues]  # MRI brain- multiple infarcts [2d echo/bubble study-NEG's/p Neurology eval]  # # s/p TURP [Sep 2017; Dr.Cope] DEC 26th CT- distended bladder   # MOLECULAR TESTING- ROS-1 POSITIVE; ALK/EGFR-NEG; PDL-1 EXPRESSION- 90%** [HIGH]     Primary cancer of left upper lobe of lung ([place])      INTERVAL HISTORY:  Melvin Willis 67 y.o.  male pleasant patient with history of adenocarcinoma of the lung, ROS-mutated. He is currently on crizotinib and return to clinic today for follow-up and discussion of recent Chest X-ray results.  He also reports URI like symptoms that started approximately 7-10 days ago and have persisted. He feels symptoms may have gradually improved and then worsened. Symptoms include: cough, congestion, plugged ears, sputum production, and malaise. He has been taking mucinex without improvement in symptoms. He has been increasing his fluid intake without improvement. He has not been evaluated for symptoms previously.   He does not have health insurance and previously expressed concern that he was having difficulty affording his medical bills. He spoke to [facility], LCSW who advised him to apply for financial hardship program. He has done this but expresses continued concern with his bills. He  says he was not eligible for medicaid due to household income.   Review of Systems  Constitutional: Positive for malaise/fatigue. Negative for chills, diaphoresis, fever and weight loss.  HENT: Positive for congestion, ear pain, sinus pain and sore throat. Negative for nosebleeds.   Eyes: Negative for double vision.  Respiratory: Positive for cough, sputum production, shortness of breath and wheezing. Negative for hemoptysis.   Cardiovascular: Positive for leg swelling. Negative for chest pain, palpitations and orthopnea.  Gastrointestinal: Negative for abdominal pain, blood in stool, constipation, diarrhea, heartburn, melena, nausea and vomiting.  Genitourinary: Negative for dysuria, frequency and urgency.  Musculoskeletal: Negative for back pain and joint pain.  Skin: Negative.  Negative for itching and rash.  Neurological: Negative for dizziness, tingling, focal weakness, weakness and headaches.  Endo/Heme/Allergies: Does not bruise/bleed easily.  Psychiatric/Behavioral: Negative for depression. The patient is not nervous/anxious and does not have insomnia.       PAST MEDICAL HISTORY :  Past Medical History:  Diagnosis Date  . Abnormal prostate specific antigen 08/08/2012  . Adiposity 04/16/2015  . Chronic kidney disease (CKD), stage III (moderate) ([place]) 11/27/2016  . CVA (cerebral vascular accident) ([place]) 06/17/2016  . Diabetes mellitus without complication ([place])   . Diverticulosis of sigmoid colon 04/16/2015  . Dyslipidemia 03/18/2015  . Hemorrhoids, internal 04/16/2015  . Hypercholesteremia 04/16/2015  . Hyperlipidemia   . Hypertension   . Primary cancer of left upper lobe of lung ([place])   . Pulmonary embolism ([place])   . Wears dentures    partial upper    PAST SURGICAL HISTORY :   Past Surgical History:  Procedure Laterality Date  .  COLONOSCOPY    . COLONOSCOPY WITH PROPOFOL N/A 05/06/2015   Procedure: COLONOSCOPY WITH PROPOFOL;  Surgeon: Lucilla Lame, MD;  Location: [place];  Service: Endoscopy;  Laterality: N/A;  ASCENDING COLON POLYPS X 2 TERMINAL ILEUM BIOPSY RANDOM COLON BX. TRANSVERSE COLON POLYP SIGMOID COLON POLYP  . ESOPHAGOGASTRODUODENOSCOPY (EGD) WITH PROPOFOL N/A 05/06/2015   Procedure: ESOPHAGOGASTRODUODENOSCOPY (EGD) WITH PROPOFOL;  Surgeon: Lucilla Lame, MD;  Location: [place];  Service: Endoscopy;  Laterality: N/A;  GASTRIC BIOPSY X1  . KIDNEY STONE SURGERY  2017    FAMILY HISTORY :   Family History  Problem Relation Age of Onset  . Diabetes Mother   . Diabetes Father   . CAD Father   . Dementia Father   . Diabetes Sister   . Cancer Maternal Uncle        Prostate  . Cancer Cousin        prostate    SOCIAL HISTORY:   Social History   Tobacco Use  . Smoking status: Never Smoker  . Smokeless tobacco: Never Used  Substance Use Topics  . Alcohol use: No    Alcohol/week: 0.0 oz  . Drug use: No    ALLERGIES:  has No Known Allergies.  MEDICATIONS:  Current Outpatient Medications  Medication Sig Dispense Refill  . atorvastatin (LIPITOR) 40 MG tablet Take 1 tablet (40 mg total) by mouth 3 (three) times a week. 48 tablet 1  . crizotinib (XALKORI) 250 MG capsule Take 1 capsule (250 mg total) by mouth 2 (two) times daily. 60 capsule 3  . finasteride (PROSCAR) 5 MG tablet Take 1 tablet (5 mg total) by mouth at bedtime. 14 tablet 0  . glipiZIDE (GLUCOTROL) 5 MG tablet Take 0.5 tablets (2.5 mg total) by mouth 2 (two) times daily before a meal. (Patient taking differently: Take 2.5 mg by mouth daily before breakfast. ) 180 tablet 0  . metFORMIN (GLUCOPHAGE) 500 MG tablet Take 1 tablet (500 mg total) by mouth 2 (two) times daily with a meal. 180 tablet 1  . rivaroxaban (XARELTO) 20 MG TABS tablet Take 1 tablet (20 mg total) by mouth daily with supper. 30 tablet 11  . tamsulosin (FLOMAX) 0.4 MG CAPS capsule Take 0.4 mg by mouth every morning.     Marland Kitchen albuterol (PROVENTIL HFA;VENTOLIN HFA) 108 (90 Base) MCG/ACT inhaler Inhale 1-2  puffs into the lungs every 6 (six) hours as needed for wheezing or shortness of breath. 1 Inhaler 2  . azithromycin (ZITHROMAX) 250 MG tablet 500 mg PO x1 on day 1, then 250 mg PO q24h x4 days 6 each 0  . lisinopril (PRINIVIL,ZESTRIL) 10 MG tablet Take 1 tablet (10 mg total) by mouth daily. (Patient not taking: Reported on 02/11/2018) 90 tablet 0   No current facility-administered medications for this visit.     PHYSICAL EXAMINATION: ECOG PERFORMANCE STATUS: 1 - Symptomatic but completely ambulatory  BP (!) 149/84 (BP Location: Left Arm, Patient Position: Sitting)   Pulse 76   Temp (!) 97.1 F (36.2 C) (Tympanic)   Resp 16   Wt 166 lb 9.6 oz (75.6 kg)   SpO2 98%   BMI 28.60 kg/m   Filed Weights   02/11/18 1028  Weight: 166 lb 9.6 oz (75.6 kg)    GENERAL: Well-nourished well-developed; Alert, no distress and comfortable. Alone.   EYES: no pallor or icterus OROPHARYNX: clear drainage on throat. Voice sounds congested.  NECK: supple; no lymph nodes felt LYMPH: no palpable lymphadenopathy in the axillary  or inguinal regions LUNGS: Wheezes in bilateral upper lobes.  HEART/CVS: regular rate & rhythm and no murmurs; trace lower extremity edema ABDOMEN: abdomen soft, non-tender and normal bowel sounds. No hepatomegaly or splenomegaly.  Musculoskeletal: no cyanosis of digits and no clubbing  PSYCH: alert & oriented x 3 with fluent speech NEURO: no focal motor/sensory deficits SKIN: no rashes or significant lesions  LABORATORY DATA:  I have reviewed the data as listed    Component Value Date/Time   NA 135 01/14/2018 0950   NA 139 01/01/2016 1000   K 4.2 01/14/2018 0950   CL 105 01/14/2018 0950   CO2 20 (L) 01/14/2018 0950   GLUCOSE 147 (H) 01/14/2018 0950   BUN 17 01/14/2018 0950   BUN 13 01/01/2016 1000   CREATININE 1.46 (H) 01/14/2018 0950   CREATININE 1.57 (H) 01/04/2017 0933   CALCIUM 8.6 (L) 01/14/2018 0950   PROT 6.5 01/14/2018 0950   PROT 7.2 01/01/2016 1000    ALBUMIN 3.1 (L) 01/14/2018 0950   ALBUMIN 4.4 01/01/2016 1000   AST 27 01/14/2018 0950   ALT 44 01/14/2018 0950   ALKPHOS 99 01/14/2018 0950   BILITOT 0.7 01/14/2018 0950   BILITOT 0.4 01/01/2016 1000   GFRNONAA 49 (L) 01/14/2018 0950   GFRNONAA 46 (L) 01/04/2017 0933   GFRAA 57 (L) 01/14/2018 0950   GFRAA 53 (L) 01/04/2017 0933    No results found for: SPEP, UPEP  Lab Results  Component Value Date   WBC 5.3 02/11/2018   NEUTROABS 3.0 02/11/2018   HGB 12.6 (L) 02/11/2018   HCT 38.5 (L) 02/11/2018   MCV 82.6 02/11/2018   PLT 284 02/11/2018      Chemistry      Component Value Date/Time   NA 135 01/14/2018 0950   NA 139 01/01/2016 1000   K 4.2 01/14/2018 0950   CL 105 01/14/2018 0950   CO2 20 (L) 01/14/2018 0950   BUN 17 01/14/2018 0950   BUN 13 01/01/2016 1000   CREATININE 1.46 (H) 01/14/2018 0950   CREATININE 1.57 (H) 01/04/2017 0933      Component Value Date/Time   CALCIUM 8.6 (L) 01/14/2018 0950   ALKPHOS 99 01/14/2018 0950   AST 27 01/14/2018 0950   ALT 44 01/14/2018 0950   BILITOT 0.7 01/14/2018 0950   BILITOT 0.4 01/01/2016 1000       RADIOGRAPHIC STUDIES: I have personally reviewed the radiological images as listed and agreed with the findings in the report. Dg Chest 2 View  Result Date: 02/10/2018 CLINICAL DATA:  Left upper lobe lung cancer. EXAM: CHEST - 2 VIEW COMPARISON:  CTs 08/20/2017 FINDINGS: Decreasing opacity in the left upper lobe. Residual left suprahilar and medial upper lobe densities, likely postradiation changes. Areas of scarring in the left lung. Right lung clear. Heart is normal size. No effusions or acute bony abnormality. IMPRESSION: Probable areas of radiation scarring in the left upper lobe. Improved aeration since prior CT. No acute findings. Electronically Signed   By: Rolm Baptise M.D.   On: 02/10/2018 11:20    ASSESSMENT & PLAN:   1. Adenocarcinoma of lung metastatic/stage IV- ROS-1 positive; CT Scan 08/20/17 showed radiation  changes to LUL, no progressive disease. DG 02/10/18 independently reviewed and results discussed with patient. Imaging again shows probable areas of radiation scarring in LUL. Improved aeration. Continue crizotinib 250 mg BID. No clinical evidence of progression and he is tolerating well.   2. URI- no acute findings on chest x-ray. Suspect symptoms related to  URI. Given duration and wheezing in clinic, will give albuterol nebulizer in clinic and start on azithromycin. Prescription for albuterol inhaler provided. Follow up with PCP if symptoms persist or do not improve.   3. DVT & Bilateral PE- stable, on Xarelto. No acute bleeding issues. Continue Xarelto as prescribed.   4. No insurance- expresses concern affording medical bills. Requests limited imaging due to expense.   rtc in 4 weeks for labs (CBC, CMP) and follow-up with Dr. Rogue Bussing.    Orders Placed This Encounter  Procedures  . CBC with Differential/Platelet    Standing Status:   Future    Standing Expiration Date:   02/12/2019  . Comprehensive metabolic panel    Standing Status:   Future    Standing Expiration Date:   02/12/2019   All questions were answered. The patient knows to call the clinic with any problems, questions or concerns.     Beckey Rutter, DNP, AGNP-C [place] at [facility] [phone] (work cell) [phone] (office) 02/11/18 4:38 PM

## 2018-03-11 ENCOUNTER — Inpatient Hospital Stay: Payer: Self-pay | Attending: Internal Medicine

## 2018-03-11 ENCOUNTER — Encounter: Payer: Self-pay | Admitting: Internal Medicine

## 2018-03-11 ENCOUNTER — Inpatient Hospital Stay (HOSPITAL_BASED_OUTPATIENT_CLINIC_OR_DEPARTMENT_OTHER): Payer: Self-pay | Admitting: Internal Medicine

## 2018-03-11 DIAGNOSIS — C3412 Malignant neoplasm of upper lobe, left bronchus or lung: Secondary | ICD-10-CM

## 2018-03-11 DIAGNOSIS — Z86718 Personal history of other venous thrombosis and embolism: Secondary | ICD-10-CM

## 2018-03-11 DIAGNOSIS — C7800 Secondary malignant neoplasm of unspecified lung: Secondary | ICD-10-CM | POA: Insufficient documentation

## 2018-03-11 DIAGNOSIS — I129 Hypertensive chronic kidney disease with stage 1 through stage 4 chronic kidney disease, or unspecified chronic kidney disease: Secondary | ICD-10-CM

## 2018-03-11 DIAGNOSIS — Z8042 Family history of malignant neoplasm of prostate: Secondary | ICD-10-CM

## 2018-03-11 DIAGNOSIS — Z7901 Long term (current) use of anticoagulants: Secondary | ICD-10-CM | POA: Insufficient documentation

## 2018-03-11 DIAGNOSIS — Z86711 Personal history of pulmonary embolism: Secondary | ICD-10-CM | POA: Insufficient documentation

## 2018-03-11 DIAGNOSIS — M7989 Other specified soft tissue disorders: Secondary | ICD-10-CM

## 2018-03-11 DIAGNOSIS — Z7984 Long term (current) use of oral hypoglycemic drugs: Secondary | ICD-10-CM | POA: Insufficient documentation

## 2018-03-11 DIAGNOSIS — N183 Chronic kidney disease, stage 3 (moderate): Secondary | ICD-10-CM | POA: Insufficient documentation

## 2018-03-11 DIAGNOSIS — Z79899 Other long term (current) drug therapy: Secondary | ICD-10-CM

## 2018-03-11 DIAGNOSIS — E1122 Type 2 diabetes mellitus with diabetic chronic kidney disease: Secondary | ICD-10-CM | POA: Insufficient documentation

## 2018-03-11 LAB — CBC WITH DIFFERENTIAL/PLATELET
Basophils Absolute: 0 10*3/uL (ref 0–0.1)
Basophils Relative: 1 %
EOS ABS: 0.4 10*3/uL (ref 0–0.7)
EOS PCT: 7 %
HCT: 40.1 % (ref 40.0–52.0)
Hemoglobin: 13.3 g/dL (ref 13.0–18.0)
LYMPHS ABS: 1.5 10*3/uL (ref 1.0–3.6)
LYMPHS PCT: 30 %
MCH: 27 pg (ref 26.0–34.0)
MCHC: 33 g/dL (ref 32.0–36.0)
MCV: 81.6 fL (ref 80.0–100.0)
MONO ABS: 0.8 10*3/uL (ref 0.2–1.0)
MONOS PCT: 15 %
Neutro Abs: 2.4 10*3/uL (ref 1.4–6.5)
Neutrophils Relative %: 47 %
Platelets: 257 10*3/uL (ref 150–440)
RBC: 4.92 MIL/uL (ref 4.40–5.90)
RDW: 16.7 % — AB (ref 11.5–14.5)
WBC: 5.1 10*3/uL (ref 3.8–10.6)

## 2018-03-11 LAB — COMPREHENSIVE METABOLIC PANEL
ALBUMIN: 3 g/dL — AB (ref 3.5–5.0)
ALT: 38 U/L (ref 0–44)
AST: 26 U/L (ref 15–41)
Alkaline Phosphatase: 97 U/L (ref 38–126)
Anion gap: 7 (ref 5–15)
BUN: 17 mg/dL (ref 8–23)
CO2: 22 mmol/L (ref 22–32)
CREATININE: 1.45 mg/dL — AB (ref 0.61–1.24)
Calcium: 8.5 mg/dL — ABNORMAL LOW (ref 8.9–10.3)
Chloride: 108 mmol/L (ref 98–111)
GFR calc Af Amer: 57 mL/min — ABNORMAL LOW (ref >60)
GFR, EST NON AFRICAN AMERICAN: 49 mL/min — AB (ref >60)
GLUCOSE: 139 mg/dL — AB (ref 70–99)
Potassium: 4.2 mmol/L (ref 3.5–5.1)
Sodium: 137 mmol/L (ref 135–145)
Total Bilirubin: 0.4 mg/dL (ref 0.3–1.2)
Total Protein: 6.9 g/dL (ref 6.5–8.1)

## 2018-03-11 NOTE — Assessment & Plan Note (Addendum)
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  Chest x-ray June 2019-improved left upper lobe radiation   # Continue crizotinib 250 mg twice a day; no clinical evidence of progression.  Tolerating well.  Stressed compliance. STABLE.   # CKD- multifactorial [prostatitic obstruction/diabetes]-creatinine 1.4; STABLE.   # Bil LE swelling: CKD/X crizotinib compression stockings.  Stable   # Bilateral PE & left lower extremity DVT:  on Xarelto; STABLE.   # follow up in 2 months/labs- CXR prior; labs [patient reluctant to proceed with CT imaging because of insurance/financial reasons.]

## 2018-03-11 NOTE — Progress Notes (Signed)
[place] OFFICE PROGRESS NOTE  Patient Care Team: [name], [name], MD as Medical Oncologist (Medical Oncology)  Cancer Staging No matching staging information was found for the patient.   Oncology History   # OCT 2017- ADENO CA LUNG; STAGE IV [; LUL; bil supraclavicular LN; Left neck LN Bx]; ROS-1 MUTATED; s/p Carbo-alimta x1[oct 2017]  # NOV 1st 2017- XALKORI 250 mg BID; JAN 15th CT- PR;  # AUG 27th Chemo-RT to persistent LUL/mediastinal LN [s/p carbo-taxol- with RT; finished Sep 22nd 2018]  # LLE DVT/bil PE/Multiple strokes [? On xarelto]-Lovenox; Jan mid 2018- xarelto [lovenox-insurance issues]  # MRI brain- multiple infarcts [2d echo/bubble study-NEG's/p Neurology eval]  ------------------------------------------------------------    # # s/p TURP [Sep 2017; Dr.Cope] DEC 26th CT- distended bladder   # MOLECULAR TESTING- ROS-1 POSITIVE; ALK/EGFR-NEG; PDL-1 EXPRESSION- 90%** [HIGH] -----------------------------------------------------------------    DIAGNOSIS: Adenocarcinoma the lung  ROS-1 +  STAGE:  IV       ;GOALS: Palliative  CURRENT/MOST RECENT THERAPY- CRIZOTINIB     Primary cancer of left upper lobe of lung ([place])      INTERVAL HISTORY:  Melvin Willis 67 y.o.  male pleasant patient above history of metastatic lung cancer-Ros: Positive currently on crizotinib is here for follow-up  Patient complains of intermittent swelling in the legs.  Not any worse.  Denies any headaches.  No nausea no vomiting.  Review of Systems  Constitutional: Negative for chills, diaphoresis, fever, malaise/fatigue and weight loss.  HENT: Negative for nosebleeds and sore throat.   Eyes: Negative for double vision.  Respiratory: Negative for cough, hemoptysis, sputum production, shortness of breath and wheezing.   Cardiovascular: Positive for leg swelling. Negative for chest pain, palpitations and  orthopnea.  Gastrointestinal: Negative for abdominal pain, blood in stool, constipation, diarrhea, heartburn, melena, nausea and vomiting.  Genitourinary: Negative for dysuria, frequency and urgency.  Musculoskeletal: Negative for back pain and joint pain.  Skin: Negative.  Negative for itching and rash.  Neurological: Negative for dizziness, tingling, focal weakness, weakness and headaches.  Endo/Heme/Allergies: Does not bruise/bleed easily.  Psychiatric/Behavioral: Negative for depression. The patient is not nervous/anxious and does not have insomnia.       PAST MEDICAL HISTORY :  Past Medical History:  Diagnosis Date  . Abnormal prostate specific antigen 08/08/2012  . Adiposity 04/16/2015  . Chronic kidney disease (CKD), stage III (moderate) ([place]) 11/27/2016  . CVA (cerebral vascular accident) ([place]) 06/17/2016  . Diabetes mellitus without complication ([place])   . Diverticulosis of sigmoid colon 04/16/2015  . Dyslipidemia 03/18/2015  . Hemorrhoids, internal 04/16/2015  . Hypercholesteremia 04/16/2015  . Hyperlipidemia   . Hypertension   . Primary cancer of left upper lobe of lung ([place])   . Pulmonary embolism ([place])   . Wears dentures    partial upper    PAST SURGICAL HISTORY :   Past Surgical History:  Procedure Laterality Date  . COLONOSCOPY    . COLONOSCOPY WITH PROPOFOL N/A 05/06/2015   Procedure: COLONOSCOPY WITH PROPOFOL;  Surgeon: Lucilla Lame, MD;  Location: [place];  Service: Endoscopy;  Laterality: N/A;  ASCENDING COLON POLYPS X 2 TERMINAL ILEUM BIOPSY RANDOM COLON BX. TRANSVERSE COLON POLYP SIGMOID COLON POLYP  . ESOPHAGOGASTRODUODENOSCOPY (EGD) WITH PROPOFOL N/A 05/06/2015   Procedure: ESOPHAGOGASTRODUODENOSCOPY (EGD) WITH PROPOFOL;  Surgeon: Lucilla Lame, MD;  Location: [place];  Service: Endoscopy;  Laterality: N/A;  GASTRIC BIOPSY X1  . KIDNEY STONE SURGERY  2017    FAMILY HISTORY :   Family History  Problem Relation Age of Onset  . Diabetes  Mother   . Diabetes Father   . CAD Father   . Dementia Father   . Diabetes Sister   . Cancer Maternal Uncle        Prostate  . Cancer Cousin        prostate    SOCIAL HISTORY:   Social History   Tobacco Use  . Smoking status: Never Smoker  . Smokeless tobacco: Never Used  Substance Use Topics  . Alcohol use: No    Alcohol/week: 0.0 oz  . Drug use: No    ALLERGIES:  has No Known Allergies.  MEDICATIONS:  Current Outpatient Medications  Medication Sig Dispense Refill  . albuterol (PROVENTIL HFA;VENTOLIN HFA) 108 (90 Base) MCG/ACT inhaler Inhale 1-2 puffs into the lungs every 6 (six) hours as needed for wheezing or shortness of breath. 1 Inhaler 2  . atorvastatin (LIPITOR) 40 MG tablet Take 1 tablet (40 mg total) by mouth 3 (three) times a week. 48 tablet 1  . azithromycin (ZITHROMAX) 250 MG tablet 500 mg PO x1 on day 1, then 250 mg PO q24h x4 days 6 each 0  . crizotinib (XALKORI) 250 MG capsule Take 1 capsule (250 mg total) by mouth 2 (two) times daily. 60 capsule 3  . finasteride (PROSCAR) 5 MG tablet Take 1 tablet (5 mg total) by mouth at bedtime. 14 tablet 0  . glipiZIDE (GLUCOTROL) 5 MG tablet Take 0.5 tablets (2.5 mg total) by mouth 2 (two) times daily before a meal. (Patient taking differently: Take 2.5 mg by mouth daily before breakfast. ) 180 tablet 0  . lisinopril (PRINIVIL,ZESTRIL) 10 MG tablet Take 1 tablet (10 mg total) by mouth daily. 90 tablet 0  . metFORMIN (GLUCOPHAGE) 500 MG tablet Take 1 tablet (500 mg total) by mouth 2 (two) times daily with a meal. 180 tablet 1  . rivaroxaban (XARELTO) 20 MG TABS tablet Take 1 tablet (20 mg total) by mouth daily with supper. 30 tablet 11  . tamsulosin (FLOMAX) 0.4 MG CAPS capsule Take 0.4 mg by mouth every morning.      No current facility-administered medications for this visit.     PHYSICAL EXAMINATION: ECOG PERFORMANCE STATUS: 0 - Asymptomatic  There were no vitals taken for this visit.  There were no vitals filed  for this visit.  GENERAL: Well-nourished well-developed; Alert, no distress and comfortable.  Alone.  EYES: no pallor or icterus OROPHARYNX: no thrush or ulceration; NECK: supple; no lymph nodes felt. LYMPH:  no palpable lymphadenopathy in the axillary or inguinal regions LUNGS: Decreased breath sounds auscultation bilaterally. No wheeze or crackles HEART/CVS: regular rate & rhythm and no murmurs; No lower extremity edema ABDOMEN:abdomen soft, non-tender and normal bowel sounds. No hepatomegaly or splenomegaly.  Musculoskeletal:no cyanosis of digits and no clubbing  PSYCH: alert & oriented x 3 with fluent speech NEURO: no focal motor/sensory deficits SKIN:  no rashes or significant lesions    LABORATORY DATA:  I have reviewed the data as listed    Component Value Date/Time   NA 137 03/11/2018 0906   NA 139 01/01/2016 1000   K 4.2 03/11/2018 0906   CL 108 03/11/2018 0906   CO2 22 03/11/2018 0906   GLUCOSE 139 (H) 03/11/2018 0906   BUN 17 03/11/2018 0906   BUN 13 01/01/2016 1000   CREATININE 1.45 (H) 03/11/2018 0906   CREATININE 1.57 (H) 01/04/2017 4462  CALCIUM 8.5 (L) 03/11/2018 0906   PROT 6.9 03/11/2018 0906   PROT 7.2 01/01/2016 1000   ALBUMIN 3.0 (L) 03/11/2018 0906   ALBUMIN 4.4 01/01/2016 1000   AST 26 03/11/2018 0906   ALT 38 03/11/2018 0906   ALKPHOS 97 03/11/2018 0906   BILITOT 0.4 03/11/2018 0906   BILITOT 0.4 01/01/2016 1000   GFRNONAA 49 (L) 03/11/2018 0906   GFRNONAA 46 (L) 01/04/2017 0933   GFRAA 57 (L) 03/11/2018 0906   GFRAA 53 (L) 01/04/2017 0933    No results found for: SPEP, UPEP  Lab Results  Component Value Date   WBC 5.1 03/11/2018   NEUTROABS 2.4 03/11/2018   HGB 13.3 03/11/2018   HCT 40.1 03/11/2018   MCV 81.6 03/11/2018   PLT 257 03/11/2018      Chemistry      Component Value Date/Time   NA 137 03/11/2018 0906   NA 139 01/01/2016 1000   K 4.2 03/11/2018 0906   CL 108 03/11/2018 0906   CO2 22 03/11/2018 0906   BUN 17  03/11/2018 0906   BUN 13 01/01/2016 1000   CREATININE 1.45 (H) 03/11/2018 0906   CREATININE 1.57 (H) 01/04/2017 0933      Component Value Date/Time   CALCIUM 8.5 (L) 03/11/2018 0906   ALKPHOS 97 03/11/2018 0906   AST 26 03/11/2018 0906   ALT 38 03/11/2018 0906   BILITOT 0.4 03/11/2018 0906   BILITOT 0.4 01/01/2016 1000       RADIOGRAPHIC STUDIES: I have personally reviewed the radiological images as listed and agreed with the findings in the report. No results found.   ASSESSMENT & PLAN:  Primary cancer of left upper lobe of lung ([place]) # Adenocarcinoma of the lung metastatic/stage IV-ROS-1 positive: CT scan August 20, 2017-left upper lobe radiation changes no progressive disease.  Chest x-ray June 2019-improved left upper lobe radiation   # Continue crizotinib 250 mg twice a day; no clinical evidence of progression.  Tolerating well.  Stressed compliance. STABLE.   # CKD- multifactorial [prostatitic obstruction/diabetes]-creatinine 1.4; STABLE.   # Bil LE swelling: CKD/X crizotinib compression stockings.  Stable   # Bilateral PE & left lower extremity DVT:  on Xarelto; STABLE.   # follow up in 2 months/labs- CXR prior; labs [patient reluctant to proceed with CT imaging because of insurance/financial reasons.]   Orders Placed This Encounter  Procedures  . DG Chest 2 View    Standing Status:   Future    Standing Expiration Date:   05/11/2019    Order Specific Question:   Reason for Exam (SYMPTOM  OR DIAGNOSIS REQUIRED)    Answer:   lung cancer    Order Specific Question:   Preferred imaging location?    Answer:   [facility]   All questions were answered. The patient knows to call the clinic with any problems, questions or concerns.      Cammie Sickle, MD 03/11/2018 10:30 PM

## 2018-04-07 ENCOUNTER — Encounter: Payer: Self-pay | Admitting: Family Medicine

## 2018-04-07 ENCOUNTER — Ambulatory Visit (INDEPENDENT_AMBULATORY_CARE_PROVIDER_SITE_OTHER): Payer: Self-pay | Admitting: Family Medicine

## 2018-04-07 VITALS — BP 122/60 | HR 74 | Temp 97.7°F | Resp 14 | Ht 64.0 in | Wt 169.2 lb

## 2018-04-07 DIAGNOSIS — E1122 Type 2 diabetes mellitus with diabetic chronic kidney disease: Secondary | ICD-10-CM

## 2018-04-07 DIAGNOSIS — I82502 Chronic embolism and thrombosis of unspecified deep veins of left lower extremity: Secondary | ICD-10-CM

## 2018-04-07 DIAGNOSIS — N183 Chronic kidney disease, stage 3 unspecified: Secondary | ICD-10-CM

## 2018-04-07 DIAGNOSIS — E7800 Pure hypercholesterolemia, unspecified: Secondary | ICD-10-CM

## 2018-04-07 DIAGNOSIS — N138 Other obstructive and reflux uropathy: Secondary | ICD-10-CM

## 2018-04-07 DIAGNOSIS — C3412 Malignant neoplasm of upper lobe, left bronchus or lung: Secondary | ICD-10-CM

## 2018-04-07 DIAGNOSIS — Z8673 Personal history of transient ischemic attack (TIA), and cerebral infarction without residual deficits: Secondary | ICD-10-CM

## 2018-04-07 DIAGNOSIS — I2782 Chronic pulmonary embolism: Secondary | ICD-10-CM

## 2018-04-07 DIAGNOSIS — N403 Nodular prostate with lower urinary tract symptoms: Secondary | ICD-10-CM

## 2018-04-07 DIAGNOSIS — I2699 Other pulmonary embolism without acute cor pulmonale: Secondary | ICD-10-CM | POA: Insufficient documentation

## 2018-04-07 LAB — POCT GLYCOSYLATED HEMOGLOBIN (HGB A1C): Hemoglobin A1C: 8.1 % — AB (ref 4.0–5.6)

## 2018-04-07 MED ORDER — METFORMIN HCL 500 MG PO TABS: 500.0000 mg | ORAL_TABLET | Freq: Three times a day (TID) | ORAL | 1 refills | Status: DC

## 2018-04-07 NOTE — Progress Notes (Signed)
Name: Melvin Willis   MRN: [id number]    DOB: [birth year]   Date:04/07/2018       Progress Note  Subjective  Chief Complaint  Chief Complaint  Patient presents with  . Follow-up    patient is here for his 3 month f/u  . Diabetes    patient checked his BS yesterday and it was 172. patient has had some episodes of being jittery or shaky.   . Medication Refill    HPI  DMII: he is on metformin twice daly, could not tolerate a higher dose because if caused nausea and vomiting,  last hgbA1C was 7.7% ,  8.8% , 8.2% and today is 8.1% . He is back on glipizide but only takes less than half in am's, he states higher dose causes hypoglycemia, it goes down to 80' before dinner, he states he skips lunch. FSBS in the 170's. Explained that he needs to eat lunch, have healthy snacks and also try taking half twice daily of glipizide and increase Metformin to three times daily. He stopped lisinopril for kidney protection because it caused dizziness. Taking lipitor a few times a week and is due for an eye exam but does not have insurance at this time.   Chronic DVT left leg, history of PE : on long term Xarelto, rx given by Hematologist. He denies easy bruising. He was diagnosed with DVT and PE prior to cancer diagnosis back in 2017. 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     Patient Active Problem List   Diagnosis Date Noted  . Pulmonary embolus ([place]) 04/07/2018  . Chronic deep vein thrombosis (DVT) of left lower extremity ([place]) 01/05/2018  .  Chronic kidney disease (CKD), stage III (moderate) ([place]) 11/27/2016  . Old cerebrovascular accident (CVA) without late effect   . Blurred vision, bilateral 06/23/2016  . Primary cancer of left upper lobe of lung ([place]) 06/12/2016  . Mediastinal mass   . History of nephrolithiasis 03/27/2016  . Pharyngeal dysphagia 01/01/2016  . Benign neoplasm of ascending colon   . Benign neoplasm of sigmoid colon   . Benign neoplasm of transverse colon   . Diverticulosis of large intestine without diverticulitis   . Overweight (BMI 25.0-29.9) 04/16/2015  . Controlled diabetes mellitus with chronic kidney disease ([place]) 03/18/2015  . Dyslipidemia 03/18/2015  . Disorder of male genital organ 08/08/2012  . 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 Elevated prostate specific antigen (PSA) 08/08/2012    Past Surgical History:  Procedure Laterality Date  . COLONOSCOPY    . COLONOSCOPY WITH PROPOFOL N/A 05/06/2015   Procedure: COLONOSCOPY WITH PROPOFOL;  Surgeon: Lucilla Lame, MD;  Location: [facility];  Service: Endoscopy;  Laterality: N/A;  ASCENDING COLON POLYPS X 2 TERMINAL ILEUM BIOPSY RANDOM COLON BX. TRANSVERSE COLON POLYP SIGMOID COLON POLYP  . ESOPHAGOGASTRODUODENOSCOPY (EGD) WITH PROPOFOL N/A 05/06/2015   Procedure: ESOPHAGOGASTRODUODENOSCOPY (EGD) WITH PROPOFOL;  Surgeon: Lucilla Lame, MD;  Location: [facility];  Service: Endoscopy;  Laterality: N/A;  GASTRIC BIOPSY  X1  . KIDNEY STONE SURGERY  2017    Family History  Problem Relation Age of Onset  . Diabetes Mother   . Diabetes Father   . CAD Father   . Dementia Father   . Diabetes Sister   . Cancer Maternal Uncle        Prostate  . Cancer Cousin        prostate    Social History   Socioeconomic History  . Marital status: Married    Spouse name: Melissa   . Number of children: 3  . Years of education: Not on file  . Highest education level: Not on file  Occupational History  . Occupation: disable      Comment: from CVA and lung cancer  Social Needs  . Financial resource strain: Somewhat hard  . Food insecurity:    Worry: Never true    Inability: Never true  . Transportation needs:    Medical: No    Non-medical: No  Tobacco Use  . Smoking status: Never Smoker  . Smokeless tobacco: Never Used  Substance and Sexual Activity  . Alcohol use: No    Alcohol/week: 0.0 oz  . Drug use: No  . Sexual activity: Yes    Partners: Female  Lifestyle  . Physical activity:    Days per week: 1 day    Minutes per session: 40 min  . Stress: Not at all  Relationships  . Social connections:    Talks on phone: More than three times a week    Gets together: Three times a week    Attends religious service: More than 4 times per year    Active member of club or organization: No    Attends meetings of clubs or organizations: Never    Relationship status: Married  . Intimate partner violence:    Fear of current or ex partner: No    Emotionally abused: No    Physically abused: No    Forced sexual activity: No  Other Topics Concern  . Not on file  Social History Narrative   Used to work until 2 years ago when got sick with DVT leg , PE , CVA and found out he had lung cancer. He does not have insurance at this time     Current Outpatient Medications:  .  atorvastatin (LIPITOR) 40 MG tablet, Take 1 tablet (40 mg total) by mouth 3 (three) times a week., Disp: 48 tablet, Rfl: 1 .  crizotinib (XALKORI) 250 MG capsule, Take 1 capsule (250 mg total) by mouth 2 (two) times daily., Disp: 60 capsule, Rfl: 3 .  finasteride (PROSCAR) 5 MG tablet, Take 1 tablet (5 mg total) by mouth at bedtime., Disp: 14 tablet, Rfl: 0 .  glipiZIDE (GLUCOTROL) 5 MG tablet, Take 0.5 tablets (2.5 mg total) by mouth 2 (two) times daily before a meal. (Patient taking differently: Take 2.5 mg by mouth daily before breakfast. ), Disp: 180 tablet, Rfl: 0 .  lisinopril (PRINIVIL,ZESTRIL) 10 MG tablet, Take 1 tablet (10 mg total)  by mouth daily., Disp: 90 tablet, Rfl: 0 .  metFORMIN (GLUCOPHAGE) 500 MG tablet, Take 1 tablet (500 mg total) by mouth 2 (two) times daily with a meal., Disp: 180 tablet, Rfl: 1 .  rivaroxaban (XARELTO) 20 MG TABS tablet, Take 1 tablet (20 mg total) by mouth daily with supper., Disp: 30 tablet, Rfl: 11 .  tamsulosin (FLOMAX) 0.4 MG CAPS capsule, Take 0.4 mg by mouth every morning. , Disp: , Rfl:  No Known Allergies   ROS  Constitutional: Negative for fever or weight change.  Respiratory: Negative for cough and shortness of breath.   Cardiovascular: Negative for chest pain or palpitations.  Gastrointestinal: Negative for abdominal pain, no bowel changes.  Musculoskeletal: Negative for gait problem or joint swelling.  Skin: Negative for rash.  Neurological: Negative for dizziness or headache.  No other specific complaints in a complete review of systems (except as listed in HPI above).  Objective  Vitals:   04/07/18 0821  BP: 122/60  Pulse: 74  Resp: 14  Temp: 97.7 F (36.5 C)  TempSrc: Oral  SpO2: 98%  Weight: 169 lb 3.2 oz (76.7 kg)  Height: '5\' 4"'  (1.626 m)    Body mass index is 29.04 kg/m.  Physical Exam  Constitutional: Patient appears well-developed and well-nourished. Overweight. No distress.  HEENT: head atraumatic, normocephalic, pupils equal and reactive to light,  neck supple, throat within normal limits Cardiovascular: Normal rate, regular rhythm and normal heart sounds.  No murmur heard. No BLE edema. Pulmonary/Chest: Effort normal and breath sounds normal. No respiratory distress. Abdominal: Soft.  There is no tenderness. Psychiatric: Patient has a normal mood and affect. behavior is normal. Judgment and thought content normal.  Recent Results (from the past 2160 hour(s))  CBC with Differential/Platelet     Status: Abnormal   Collection Time: 01/14/18  9:50 AM  Result Value Ref Range   WBC 5.3 3.8 - 10.6 K/uL   RBC 4.88 4.40 - 5.90 MIL/uL   Hemoglobin  13.3 13.0 - 18.0 g/dL   HCT 40.3 40.0 - 52.0 %   MCV 82.6 80.0 - 100.0 fL   MCH 27.2 26.0 - 34.0 pg   MCHC 32.9 32.0 - 36.0 g/dL   RDW 17.9 (H) 11.5 - 14.5 %   Platelets 260 150 - 440 K/uL   Neutrophils Relative % 49 %   Neutro Abs 2.6 1.4 - 6.5 K/uL   Lymphocytes Relative 29 %   Lymphs Abs 1.5 1.0 - 3.6 K/uL   Monocytes Relative 16 %   Monocytes Absolute 0.8 0.2 - 1.0 K/uL   Eosinophils Relative 5 %   Eosinophils Absolute 0.3 0 - 0.7 K/uL   Basophils Relative 1 %   Basophils Absolute 0.0 0 - 0.1 K/uL    Comment: Performed at [facility], [address] [place], [place] [address]  Comprehensive metabolic panel     Status: Abnormal   Collection Time: 01/14/18  9:50 AM  Result Value Ref Range   Sodium 135 135 - 145 mmol/L   Potassium 4.2 3.5 - 5.1 mmol/L   Chloride 105 101 - 111 mmol/L   CO2 20 (L) 22 - 32 mmol/L   Glucose, Bld 147 (H) 65 - 99 mg/dL   BUN 17 6 - 20 mg/dL   Creatinine, Ser 1.46 (H) 0.61 - 1.24 mg/dL   Calcium 8.6 (L) 8.9 - 10.3 mg/dL   Total Protein 6.5 6.5 - 8.1 g/dL   Albumin 3.1 (L) 3.5 - 5.0 g/dL   AST 27 15 - 41 U/L   ALT 44 17 - 63 U/L   Alkaline Phosphatase 99 38 - 126 U/L   Total Bilirubin 0.7 0.3 - 1.2 mg/dL   GFR calc non Af Amer 49 (L) >60 mL/min   GFR calc Af Amer 57 (L) >60 mL/min    Comment: (NOTE) The eGFR has been calculated using the CKD EPI equation. This calculation has not been validated in all clinical situations. eGFR's  persistently <60 mL/min signify possible Chronic Kidney Disease.    Anion gap 10 5 - 15    Comment: Performed at [facility], [address] [place], [place] [address]  CBC with Differential     Status: Abnormal   Collection Time: 02/11/18 10:06 AM  Result Value Ref Range   WBC 5.3 3.8 - 10.6 K/uL   RBC 4.66 4.40 - 5.90 MIL/uL   Hemoglobin 12.6 (L) 13.0 - 18.0 g/dL   HCT 38.5 (L) 40.0 - 52.0 %   MCV 82.6 80.0 - 100.0 fL   MCH 27.1 26.0 - 34.0 pg   MCHC 32.8 32.0 - 36.0 g/dL   RDW 17.0 (H) 11.5  - 14.5 %   Platelets 284 150 - 440 K/uL   Neutrophils Relative % 55 %   Neutro Abs 3.0 1.4 - 6.5 K/uL   Lymphocytes Relative 27 %   Lymphs Abs 1.4 1.0 - 3.6 K/uL   Monocytes Relative 14 %   Monocytes Absolute 0.7 0.2 - 1.0 K/uL   Eosinophils Relative 3 %   Eosinophils Absolute 0.1 0 - 0.7 K/uL   Basophils Relative 1 %   Basophils Absolute 0.0 0 - 0.1 K/uL    Comment: Performed at [facility], [address] [place], [place] [address]  Comprehensive metabolic panel     Status: Abnormal   Collection Time: 03/11/18  9:06 AM  Result Value Ref Range   Sodium 137 135 - 145 mmol/L   Potassium 4.2 3.5 - 5.1 mmol/L   Chloride 108 98 - 111 mmol/L    Comment: Please note change in reference range.   CO2 22 22 - 32 mmol/L   Glucose, Bld 139 (H) 70 - 99 mg/dL    Comment: Please note change in reference range.   BUN 17 8 - 23 mg/dL    Comment: Please note change in reference range.   Creatinine, Ser 1.45 (H) 0.61 - 1.24 mg/dL   Calcium 8.5 (L) 8.9 - 10.3 mg/dL   Total Protein 6.9 6.5 - 8.1 g/dL   Albumin 3.0 (L) 3.5 - 5.0 g/dL   AST 26 15 - 41 U/L   ALT 38 0 - 44 U/L    Comment: Please note change in reference range.   Alkaline Phosphatase 97 38 - 126 U/L   Total Bilirubin 0.4 0.3 - 1.2 mg/dL   GFR calc non Af Amer 49 (L) >60 mL/min   GFR calc Af Amer 57 (L) >60 mL/min    Comment: (NOTE) The eGFR has been calculated using the CKD EPI equation. This calculation has not been validated in all clinical situations. eGFR's persistently <60 mL/min signify possible Chronic Kidney Disease.    Anion gap 7 5 - 15    Comment: Performed at [facility], [address] [place], [place] [address]  CBC with Differential/Platelet     Status: Abnormal   Collection Time: 03/11/18  9:06 AM  Result Value Ref Range   WBC 5.1 3.8 - 10.6 K/uL   RBC 4.92 4.40 - 5.90 MIL/uL   Hemoglobin 13.3 13.0 - 18.0 g/dL   HCT 40.1 40.0 - 52.0 %   MCV 81.6 80.0 - 100.0 fL   MCH 27.0 26.0 - 34.0 pg    MCHC 33.0 32.0 - 36.0 g/dL   RDW 16.7 (H) 11.5 - 14.5 %   Platelets 257 150 - 440 K/uL   Neutrophils Relative % 47 %   Neutro Abs 2.4 1.4 - 6.5 K/uL  Lymphocytes Relative 30 %   Lymphs Abs 1.5 1.0 - 3.6 K/uL   Monocytes Relative 15 %   Monocytes Absolute 0.8 0.2 - 1.0 K/uL   Eosinophils Relative 7 %   Eosinophils Absolute 0.4 0 - 0.7 K/uL   Basophils Relative 1 %   Basophils Absolute 0.0 0 - 0.1 K/uL    Comment: Performed at [facility], [address] [place], [name] [address]  POCT glycosylated hemoglobin (Hb A1C)     Status: Abnormal   Collection Time: 04/07/18  8:30 AM  Result Value Ref Range   Hemoglobin A1C 8.1 (A) 4.0 - 5.6 %   HbA1c POC (<> result, manual entry)  4.0 - 5.6 %   HbA1c, POC (prediabetic range)  5.7 - 6.4 %   HbA1c, POC (controlled diabetic range)  0.0 - 7.0 %     PHQ2/9: Depression screen [facility] 2/9 04/07/2018 01/05/2018 10/01/2017 08/19/2017 07/02/2017  Decreased Interest 0 1 0 0 0  Down, Depressed, Hopeless 0 0 0 0 0  PHQ - 2 Score 0 1 0 0 0  Altered sleeping 0 0 - - -  Tired, decreased energy 1 1 - - -  Change in appetite 0 1 - - -  Feeling bad or failure about yourself  0 - - - -  Trouble concentrating 0 0 - - -  Moving slowly or fidgety/restless 0 0 - - -  Suicidal thoughts 0 0 - - -  PHQ-9 Score 1 3 - - -  Difficult doing work/chores Not difficult at all Not difficult at all - - -     Fall Risk: Fall Risk  04/07/2018 01/05/2018 10/01/2017 08/19/2017 07/02/2017  Falls in the past year? No No No No No     Functional Status Survey: Is the patient deaf or have difficulty hearing?: No Does the patient have difficulty seeing, even when wearing glasses/contacts?: No(patient stated that he needs reading glasses) Does the patient have difficulty concentrating, remembering, or making decisions?: No Does the patient have difficulty walking or climbing stairs?: No Does the patient have difficulty dressing or bathing?: No Does the patient have  difficulty doing errands alone such as visiting a doctor's office or shopping?: No    Assessment & Plan  1. Controlled type 2 diabetes mellitus with stage 3 chronic kidney disease, without long-term current use of insulin (HCC)  - POCT glycosylated hemoglobin (Hb A1C)  2. CKD (chronic kidney disease), stage III (HCC)  Stable   3. Other chronic pulmonary embolism without acute cor pulmonale ([place])  Stay on Xarelto   4. Old cerebrovascular accident (CVA) without late effect  No deficit   5. Chronic deep vein thrombosis (DVT) of left lower extremity, unspecified vein (HCC)  Some edema that is controlled with compression stocking hoses  6. Primary cancer of left upper lobe of lung [facility])  Seeing oncologist   7. Hypercholesteremia  Continue statin therapy   8. Nodular prostate with urinary obstruction  Symptoms under control at this time, taking flomax.

## 2018-05-12 ENCOUNTER — Ambulatory Visit
Admission: RE | Admit: 2018-05-12 | Discharge: 2018-05-12 | Disposition: A | Discharge status: Home / Self Care | Payer: Self-pay | Source: Ambulatory Visit | Attending: Internal Medicine | Admitting: Internal Medicine

## 2018-05-12 DIAGNOSIS — C3412 Malignant neoplasm of upper lobe, left bronchus or lung: Secondary | ICD-10-CM | POA: Insufficient documentation

## 2018-05-13 ENCOUNTER — Inpatient Hospital Stay (HOSPITAL_BASED_OUTPATIENT_CLINIC_OR_DEPARTMENT_OTHER): Payer: Self-pay | Admitting: Internal Medicine

## 2018-05-13 ENCOUNTER — Telehealth: Payer: Self-pay | Admitting: Pharmacist

## 2018-05-13 ENCOUNTER — Inpatient Hospital Stay: Payer: Self-pay | Attending: Internal Medicine

## 2018-05-13 VITALS — BP 133/82 | HR 54 | Temp 97.6°F | Resp 18 | Ht 64.0 in | Wt 170.0 lb

## 2018-05-13 DIAGNOSIS — I82402 Acute embolism and thrombosis of unspecified deep veins of left lower extremity: Secondary | ICD-10-CM | POA: Insufficient documentation

## 2018-05-13 DIAGNOSIS — Z7901 Long term (current) use of anticoagulants: Secondary | ICD-10-CM

## 2018-05-13 DIAGNOSIS — C3412 Malignant neoplasm of upper lobe, left bronchus or lung: Secondary | ICD-10-CM | POA: Insufficient documentation

## 2018-05-13 DIAGNOSIS — E1122 Type 2 diabetes mellitus with diabetic chronic kidney disease: Secondary | ICD-10-CM | POA: Insufficient documentation

## 2018-05-13 DIAGNOSIS — Z79899 Other long term (current) drug therapy: Secondary | ICD-10-CM

## 2018-05-13 DIAGNOSIS — I129 Hypertensive chronic kidney disease with stage 1 through stage 4 chronic kidney disease, or unspecified chronic kidney disease: Secondary | ICD-10-CM

## 2018-05-13 DIAGNOSIS — I2699 Other pulmonary embolism without acute cor pulmonale: Secondary | ICD-10-CM

## 2018-05-13 LAB — CBC WITH DIFFERENTIAL/PLATELET
BASOS ABS: 0 10*3/uL (ref 0–0.1)
BASOS PCT: 1 %
Eosinophils Absolute: 0.3 10*3/uL (ref 0–0.7)
Eosinophils Relative: 6 %
HEMATOCRIT: 39.2 % — AB (ref 40.0–52.0)
Hemoglobin: 12.9 g/dL — ABNORMAL LOW (ref 13.0–18.0)
Lymphocytes Relative: 31 %
Lymphs Abs: 1.5 10*3/uL (ref 1.0–3.6)
MCH: 27.4 pg (ref 26.0–34.0)
MCHC: 32.9 g/dL (ref 32.0–36.0)
MCV: 83.3 fL (ref 80.0–100.0)
MONO ABS: 0.7 10*3/uL (ref 0.2–1.0)
Monocytes Relative: 15 %
NEUTROS ABS: 2.3 10*3/uL (ref 1.4–6.5)
Neutrophils Relative %: 47 %
Platelets: 216 10*3/uL (ref 150–440)
RBC: 4.71 MIL/uL (ref 4.40–5.90)
RDW: 18.2 % — AB (ref 11.5–14.5)
WBC: 4.7 10*3/uL (ref 3.8–10.6)

## 2018-05-13 MED ORDER — RIVAROXABAN 20 MG PO TABS: 20.0000 mg | ORAL_TABLET | Freq: Every day | ORAL | 11 refills | Status: DC

## 2018-05-13 NOTE — Progress Notes (Signed)
[facility] OFFICE PROGRESS NOTE  Patient Care Team: [name], [name], MD as Medical Oncologist (Medical Oncology)  Cancer Staging No matching staging information was found for the patient.   Oncology History   # OCT 2017- ADENO CA LUNG; STAGE IV [; LUL; bil supraclavicular LN; Left neck LN Bx]; ROS-1 MUTATED; s/p Carbo-alimta x1[oct 2017]  # NOV 1st 2017- XALKORI 250 mg BID; JAN 15th CT- PR;  # AUG 27th Chemo-RT to persistent LUL/mediastinal LN [s/p carbo-taxol- with RT; finished Sep 22nd 2018]  # LLE DVT/bil PE/Multiple strokes [? On xarelto]-Lovenox; Jan mid 2018- xarelto [lovenox-insurance issues]  # MRI brain- multiple infarcts [2d echo/bubble study-NEG's/p Neurology eval]  ------------------------------------------------------------    # # s/p TURP [Sep 2017; Dr.Cope] DEC 26th CT- distended bladder   # MOLECULAR TESTING- ROS-1 POSITIVE; ALK/EGFR-NEG; PDL-1 EXPRESSION- 90%** [HIGH] -----------------------------------------------------------------    DIAGNOSIS: Adenocarcinoma the lung  ROS-1 +  STAGE:  IV       ;GOALS: Palliative  CURRENT/MOST RECENT THERAPY- CRIZOTINIB     Primary cancer of left upper lobe of lung ([place])      INTERVAL HISTORY:  Melvin Willis 67 y.o.  male pleasant patient above history of metastatic lung cancer-Ros: Positive currently on crizotinib is here for follow-up.  Patient denies any worsening shortness of breath or cough.  Denies any headaches.  Denies any nausea vomiting.  He states that he is running out of his Xarelto soon.  Mild swelling in the legs.  Review of Systems  Constitutional: Negative for chills, diaphoresis, fever, malaise/fatigue and weight loss.  HENT: Negative for nosebleeds and sore throat.   Eyes: Negative for double vision.  Respiratory: Negative for cough, hemoptysis, sputum production, shortness of breath and wheezing.    Cardiovascular: Positive for leg swelling. Negative for chest pain, palpitations and orthopnea.  Gastrointestinal: Negative for abdominal pain, blood in stool, constipation, diarrhea, heartburn, melena, nausea and vomiting.  Genitourinary: Negative for dysuria, frequency and urgency.  Musculoskeletal: Negative for back pain and joint pain.  Skin: Negative.  Negative for itching and rash.  Neurological: Negative for dizziness, tingling, focal weakness, weakness and headaches.  Endo/Heme/Allergies: Does not bruise/bleed easily.  Psychiatric/Behavioral: Negative for depression. The patient is not nervous/anxious and does not have insomnia.       PAST MEDICAL HISTORY :  Past Medical History:  Diagnosis Date  . Abnormal prostate specific antigen 08/08/2012  . Adiposity 04/16/2015  . Chronic kidney disease (CKD), stage III (moderate) ([place]) 11/27/2016  . CVA (cerebral vascular accident) ([place]) 06/17/2016  . Diabetes mellitus without complication ([place])   . Diverticulosis of sigmoid colon 04/16/2015  . Dyslipidemia 03/18/2015  . Hemorrhoids, internal 04/16/2015  . Hypercholesteremia 04/16/2015  . Hyperlipidemia   . Hypertension   . Primary cancer of left upper lobe of lung ([place])   . Pulmonary embolism ([place])   . Wears dentures    partial upper    PAST SURGICAL HISTORY :   Past Surgical History:  Procedure Laterality Date  . COLONOSCOPY    . COLONOSCOPY WITH PROPOFOL N/A 05/06/2015   Procedure: COLONOSCOPY WITH PROPOFOL;  Surgeon: Lucilla Lame, MD;  Location: [facility];  Service: Endoscopy;  Laterality: N/A;  ASCENDING COLON POLYPS X 2 TERMINAL ILEUM BIOPSY RANDOM COLON BX. TRANSVERSE COLON POLYP SIGMOID COLON POLYP  . ESOPHAGOGASTRODUODENOSCOPY (EGD) WITH PROPOFOL N/A 05/06/2015   Procedure: ESOPHAGOGASTRODUODENOSCOPY (EGD) WITH PROPOFOL;  Surgeon: Lucilla Lame, MD;  Location: [facility];  Service: Endoscopy;  Laterality: N/A;  GASTRIC BIOPSY X1  . KIDNEY STONE SURGERY  2017     FAMILY HISTORY :   Family History  Problem Relation Age of Onset  . Diabetes Mother   . Diabetes Father   . CAD Father   . Dementia Father   . Diabetes Sister   . Cancer Maternal Uncle        Prostate  . Cancer Cousin        prostate    SOCIAL HISTORY:   Social History   Tobacco Use  . Smoking status: Never Smoker  . Smokeless tobacco: Never Used  Substance Use Topics  . Alcohol use: No    Alcohol/week: 0.0 standard drinks  . Drug use: No    ALLERGIES:  has No Known Allergies.  MEDICATIONS:  Current Outpatient Medications  Medication Sig Dispense Refill  . atorvastatin (LIPITOR) 40 MG tablet Take 1 tablet (40 mg total) by mouth 3 (three) times a week. 48 tablet 1  . crizotinib (XALKORI) 250 MG capsule Take 1 capsule (250 mg total) by mouth 2 (two) times daily. 60 capsule 3  . finasteride (PROSCAR) 5 MG tablet Take 1 tablet (5 mg total) by mouth at bedtime. 14 tablet 0  . glipiZIDE (GLUCOTROL) 5 MG tablet Take 0.5 tablets (2.5 mg total) by mouth 2 (two) times daily before a meal. (Patient taking differently: Take 2.5 mg by mouth daily before breakfast. ) 180 tablet 0  . lisinopril (PRINIVIL,ZESTRIL) 10 MG tablet Take 1 tablet (10 mg total) by mouth daily. 90 tablet 0  . metFORMIN (GLUCOPHAGE) 500 MG tablet Take 1 tablet (500 mg total) by mouth 3 (three) times daily with meals. 270 tablet 1  . tamsulosin (FLOMAX) 0.4 MG CAPS capsule Take 0.4 mg by mouth every morning.     . rivaroxaban (XARELTO) 20 MG TABS tablet Take 1 tablet (20 mg total) by mouth daily with supper. 30 tablet 11   No current facility-administered medications for this visit.     PHYSICAL EXAMINATION: ECOG PERFORMANCE STATUS: 0 - Asymptomatic  BP 133/82   Pulse (!) 54   Temp 97.6 F (36.4 C) (Tympanic)   Resp 18   Ht _0  (1.626 m)   Wt 170 lb (77.1 kg)   BMI 29.18 kg/m   Filed Weights   05/13/18 1047  Weight: 170 lb (77.1 kg)    Physical Exam  Constitutional: He is oriented to  person, place, and time and well-developed, well-nourished, and in no distress.  Alone. He is walking by himself.  HENT:  Head: Normocephalic and atraumatic.  Mouth/Throat: Oropharynx is clear and moist. No oropharyngeal exudate.  Eyes: Pupils are equal, round, and reactive to light.  Neck: Normal range of motion. Neck supple.  Cardiovascular: Normal rate and regular rhythm.  Pulmonary/Chest: No respiratory distress. He has no wheezes.  Abdominal: Soft. Bowel sounds are normal. He exhibits no distension and no mass. There is no tenderness. There is no rebound and no guarding.  Musculoskeletal: Normal range of motion. He exhibits no edema or tenderness.  Neurological: He is alert and oriented to person, place, and time.  Skin: Skin is warm.  Psychiatric: Affect normal.       LABORATORY DATA:  I have reviewed the data as listed    Component Value Date/Time   NA 137 03/11/2018 0906   NA 139 01/01/2016 1000   K 4.2 03/11/2018 0906   CL 108 03/11/2018 0906   CO2 22 03/11/2018 0906  GLUCOSE 139 (H) 03/11/2018 0906   BUN 17 03/11/2018 0906   BUN 13 01/01/2016 1000   CREATININE 1.45 (H) 03/11/2018 0906   CREATININE 1.57 (H) 01/04/2017 0933   CALCIUM 8.5 (L) 03/11/2018 0906   PROT 6.9 03/11/2018 0906   PROT 7.2 01/01/2016 1000   ALBUMIN 3.0 (L) 03/11/2018 0906   ALBUMIN 4.4 01/01/2016 1000   AST 26 03/11/2018 0906   ALT 38 03/11/2018 0906   ALKPHOS 97 03/11/2018 0906   BILITOT 0.4 03/11/2018 0906   BILITOT 0.4 01/01/2016 1000   GFRNONAA 49 (L) 03/11/2018 0906   GFRNONAA 46 (L) 01/04/2017 0933   GFRAA 57 (L) 03/11/2018 0906   GFRAA 53 (L) 01/04/2017 0933    No results found for: SPEP, UPEP  Lab Results  Component Value Date   WBC 4.7 05/13/2018   NEUTROABS 2.3 05/13/2018   HGB 12.9 (L) 05/13/2018   HCT 39.2 (L) 05/13/2018   MCV 83.3 05/13/2018   PLT 216 05/13/2018      Chemistry      Component Value Date/Time   NA 137 03/11/2018 0906   NA 139 01/01/2016 1000    K 4.2 03/11/2018 0906   CL 108 03/11/2018 0906   CO2 22 03/11/2018 0906   BUN 17 03/11/2018 0906   BUN 13 01/01/2016 1000   CREATININE 1.45 (H) 03/11/2018 0906   CREATININE 1.57 (H) 01/04/2017 0933      Component Value Date/Time   CALCIUM 8.5 (L) 03/11/2018 0906   ALKPHOS 97 03/11/2018 0906   AST 26 03/11/2018 0906   ALT 38 03/11/2018 0906   BILITOT 0.4 03/11/2018 0906   BILITOT 0.4 01/01/2016 1000       RADIOGRAPHIC STUDIES: I have personally reviewed the radiological images as listed and agreed with the findings in the report. Dg Chest 2 View  Result Date: 05/12/2018 CLINICAL DATA:  Follow-up LEFT lung cancer. EXAM: CHEST - 2 VIEW COMPARISON:  02/10/2018 chest radiograph and prior studies. FINDINGS: MEDIAL LEFT UPPER lobe opacity and volume loss are unchanged. The lungs are otherwise clear. The cardiomediastinal silhouette is unchanged. No pleural effusion or pneumothorax. IMPRESSION: Unchanged appearance of the chest with LEFT UPPER lobe opacity/volume loss. Electronically Signed   By: Margarette Canada M.D.   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  Chest x-ray AUG 2019-STABLE.   # Continue crizotinib 250 mg twice a day; no clinical evidence of progression.  Tolerating well.    # CKD- multifactorial [prostatitic obstruction/diabetes]-creatinine 1.4; STABLE.   # Bil LE swelling: CKD/X crizotinib compression stockings. STABLE.   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 Will check with pharmacy for refills.     # follow up in 6 week/ labs/ MD   No orders of the defined types were placed in this encounter.  All questions were answered. The patient knows to call the clinic with any problems, questions or concerns.      Cammie Sickle, MD 05/13/2018  12:12 PM

## 2018-05-13 NOTE — Progress Notes (Signed)
Patient stated that he is out of xarelto. He is waiting on approval with [employer] for renewal. He states that he has not spoken with them regarding any updates for patient assistance. He is completely out of this medication. I spoke with Mitchell Heir, [facility] pharmacist and Dr. Rogue Bussing. Patient given 2 weeks supply of xarelto per v/o Dr. Rogue Bussing. Patient was given instructions to contact the cancer center in approx. 1 week before he runs out of the xarelto to allow sufficient time to get RFs of samples. He was cautioned NOT TO WAIT until the last minute to let us know that he does not have any samples left. Patient also given information on [place] program as well as [facility] med mgmt program. Patient does not have any insurance. He will be medicare eligible in December 2019. Pt made aware that he needs to make an apt with social security in the next few weeks to start this process. It may take several months to confirm. Pt also educated on medicare part b for provider's office and medicare part D coverage for drug.

## 2018-05-13 NOTE — Assessment & Plan Note (Addendum)
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  Chest x-ray AUG 2019-STABLE.   # Continue crizotinib 250 mg twice a day; no clinical evidence of progression.  Tolerating well.    # CKD- multifactorial [prostatitic obstruction/diabetes]-creatinine 1.4; STABLE.   # Bil LE swelling: CKD/X crizotinib compression stockings. STABLE.   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 Will check with pharmacy for refills.     # follow up in 6 week/ labs/ MD

## 2018-05-13 NOTE — Telephone Encounter (Signed)
Oral Chemotherapy Pharmacist Encounter  Dispensed samples to patient:  Medication: Xarelto Instructions: Take 1 tablet (20mg ) by mouth daily with supper Quantity dispensed: 14 tablets Days supply: 14 days Manufacturer: [employer] Lot: [id number] Exp: 03/06/18  Darl Pikes, PharmD, BCPS, BCOP Hematology/Oncology Clinical Pharmacist [facility] [phone]  05/13/2018 11:22 AM

## 2018-05-23 ENCOUNTER — Telehealth: Payer: Self-pay | Admitting: Pharmacy Technician

## 2018-05-23 NOTE — Telephone Encounter (Signed)
Oral Oncology Patient Advocate Encounter  Called Wynetta Emery and [place] ([employer]) to check the status of Mr Empson's Xarelto application.  They state that they spoke with "Bethany" on 05/18/18 and told her that they needed another page 4 of the application. I cannot find any record of this being done so I have filled out page 4 of the application and will have [name] sign.    After complete I will fax to [place] [phone] and mark as urgent.  [place] Patient [place] Phone [phone] Fax [phone] 05/23/2018 3:52 PM

## 2018-05-25 NOTE — Telephone Encounter (Signed)
Oral Oncology Patient Advocate Encounter   Called Melvin Willis and [place] ([employer]) to check the status of Melvin Willis's Xarelto application.  They received page 4 of the application on 02/11/99 but still takes 3-5 days to process new information.  I will call back to check status Thursday afternoon.  [place] Patient [place] Phone [phone] Fax [phone] 05/25/2018 11:18 AM

## 2018-05-26 NOTE — Telephone Encounter (Signed)
Oral Oncology Patient Advocate Encounter  Received notification from [facility] Patient Assistance program that patient has been successfully re-enrolled into their program to receive Xarelto from the manufacturer at $0 out of pocket until 05/27/2019.    I called and spoke with patient.  He knows we will have to re-apply.   Patient knows to call the office with questions or concerns.   Oral Oncology Clinic will continue to follow.  [place] Patient [place] Phone [phone] Fax [phone] 05/26/2018 11:20 AM

## 2018-06-08 ENCOUNTER — Other Ambulatory Visit: Payer: Self-pay | Admitting: *Deleted

## 2018-06-08 DIAGNOSIS — C3412 Malignant neoplasm of upper lobe, left bronchus or lung: Secondary | ICD-10-CM

## 2018-06-08 MED ORDER — CRIZOTINIB 250 MG PO CAPS: 250.0000 mg | ORAL_CAPSULE | Freq: Two times a day (BID) | ORAL | 3 refills | Status: DC

## 2018-06-23 ENCOUNTER — Other Ambulatory Visit: Payer: Self-pay | Admitting: Internal Medicine

## 2018-06-23 DIAGNOSIS — C3412 Malignant neoplasm of upper lobe, left bronchus or lung: Secondary | ICD-10-CM

## 2018-06-24 ENCOUNTER — Other Ambulatory Visit: Payer: Self-pay

## 2018-06-24 ENCOUNTER — Inpatient Hospital Stay (HOSPITAL_BASED_OUTPATIENT_CLINIC_OR_DEPARTMENT_OTHER): Payer: PRIVATE HEALTH INSURANCE | Admitting: Internal Medicine

## 2018-06-24 ENCOUNTER — Encounter: Payer: Self-pay | Admitting: Internal Medicine

## 2018-06-24 ENCOUNTER — Inpatient Hospital Stay: Payer: Self-pay | Attending: Internal Medicine

## 2018-06-24 VITALS — BP 153/89 | HR 61 | Temp 97.6°F | Resp 16 | Wt 160.0 lb

## 2018-06-24 DIAGNOSIS — C3412 Malignant neoplasm of upper lobe, left bronchus or lung: Secondary | ICD-10-CM | POA: Insufficient documentation

## 2018-06-24 DIAGNOSIS — I129 Hypertensive chronic kidney disease with stage 1 through stage 4 chronic kidney disease, or unspecified chronic kidney disease: Secondary | ICD-10-CM

## 2018-06-24 DIAGNOSIS — N189 Chronic kidney disease, unspecified: Secondary | ICD-10-CM | POA: Insufficient documentation

## 2018-06-24 DIAGNOSIS — Z79818 Long term (current) use of other agents affecting estrogen receptors and estrogen levels: Secondary | ICD-10-CM | POA: Insufficient documentation

## 2018-06-24 DIAGNOSIS — E1122 Type 2 diabetes mellitus with diabetic chronic kidney disease: Secondary | ICD-10-CM

## 2018-06-24 DIAGNOSIS — Z79899 Other long term (current) drug therapy: Secondary | ICD-10-CM | POA: Diagnosis not present

## 2018-06-24 DIAGNOSIS — Z7901 Long term (current) use of anticoagulants: Secondary | ICD-10-CM | POA: Insufficient documentation

## 2018-06-24 DIAGNOSIS — Z79811 Long term (current) use of aromatase inhibitors: Secondary | ICD-10-CM | POA: Insufficient documentation

## 2018-06-24 LAB — CBC WITH DIFFERENTIAL/PLATELET
ABS IMMATURE GRANULOCYTES: 0.02 10*3/uL (ref 0.00–0.07)
BASOS PCT: 0 %
Basophils Absolute: 0 10*3/uL (ref 0.0–0.1)
EOS ABS: 0.2 10*3/uL (ref 0.0–0.5)
Eosinophils Relative: 4 %
HCT: 42.1 % (ref 39.0–52.0)
Hemoglobin: 13.7 g/dL (ref 13.0–17.0)
Immature Granulocytes: 0 %
Lymphocytes Relative: 30 %
Lymphs Abs: 1.6 10*3/uL (ref 0.7–4.0)
MCH: 26.8 pg (ref 26.0–34.0)
MCHC: 32.5 g/dL (ref 30.0–36.0)
MCV: 82.4 fL (ref 80.0–100.0)
Monocytes Absolute: 0.7 10*3/uL (ref 0.1–1.0)
Monocytes Relative: 13 %
NEUTROS ABS: 2.8 10*3/uL (ref 1.7–7.7)
NEUTROS PCT: 53 %
PLATELETS: 260 10*3/uL (ref 150–400)
RBC: 5.11 MIL/uL (ref 4.22–5.81)
RDW: 15.9 % — AB (ref 11.5–15.5)
WBC: 5.3 10*3/uL (ref 4.0–10.5)
nRBC: 0 % (ref 0.0–0.2)

## 2018-06-24 LAB — COMPREHENSIVE METABOLIC PANEL
ALBUMIN: 3.6 g/dL (ref 3.5–5.0)
ALT: 52 U/L — AB (ref 0–44)
ANION GAP: 9 (ref 5–15)
AST: 32 U/L (ref 15–41)
Alkaline Phosphatase: 87 U/L (ref 38–126)
BUN: 18 mg/dL (ref 8–23)
CO2: 22 mmol/L (ref 22–32)
Calcium: 9.1 mg/dL (ref 8.9–10.3)
Chloride: 105 mmol/L (ref 98–111)
Creatinine, Ser: 1.48 mg/dL — ABNORMAL HIGH (ref 0.61–1.24)
GFR calc Af Amer: 56 mL/min — ABNORMAL LOW (ref >60)
GFR, EST NON AFRICAN AMERICAN: 48 mL/min — AB (ref >60)
Glucose, Bld: 140 mg/dL — ABNORMAL HIGH (ref 70–99)
POTASSIUM: 4.4 mmol/L (ref 3.5–5.1)
SODIUM: 136 mmol/L (ref 135–145)
TOTAL PROTEIN: 6.9 g/dL (ref 6.5–8.1)
Total Bilirubin: 0.5 mg/dL (ref 0.3–1.2)

## 2018-06-24 NOTE — Progress Notes (Signed)
[place] OFFICE PROGRESS NOTE  Patient Care Team: [name], [name], MD as Medical Oncologist (Medical Oncology)  Cancer Staging No matching staging information was found for the patient.   Oncology History   # OCT 2017- ADENO CA LUNG; STAGE IV [; LUL; bil supraclavicular LN; Left neck LN Bx]; ROS-1 MUTATED; s/p Carbo-alimta x1[oct 2017]  # NOV 1st 2017- XALKORI 250 mg BID; JAN 15th CT- PR;  # AUG 27th Chemo-RT to persistent LUL/mediastinal LN [s/p carbo-taxol- with RT; finished Sep 22nd 2018]  # LLE DVT/bil PE/Multiple strokes [? On xarelto]-Lovenox; Jan mid 2018- xarelto [lovenox-insurance issues]  # MRI brain- multiple infarcts [2d echo/bubble study-NEG's/p Neurology eval]  ------------------------------------------------------------    # # s/p TURP [Sep 2017; Dr.Cope] DEC 26th CT- distended bladder   # MOLECULAR TESTING- ROS-1 POSITIVE; ALK/EGFR-NEG; PDL-1 EXPRESSION- 90%** [HIGH] -----------------------------------------------------------------    DIAGNOSIS: Adenocarcinoma the lung  ROS-1 +  STAGE:  IV       ;GOALS: Palliative  CURRENT/MOST RECENT THERAPY- CRIZOTINIB     Primary cancer of left upper lobe of lung ([place])      INTERVAL HISTORY:  Melvin Willis 67 y.o.  male pleasant patient above history of metastatic lung cancer-Ros: Positive currently on crizotinib is here for follow-up.  Denies any worsening shortness of breath or cough.  Mild swelling in the legs.  No nausea no vomiting.  No blood in stools black or stools.  No nausea no vomiting  Review of Systems  Constitutional: Negative for chills, diaphoresis, fever, malaise/fatigue and weight loss.  HENT: Negative for nosebleeds and sore throat.   Eyes: Negative for double vision.  Respiratory: Negative for cough, hemoptysis, sputum production, shortness of breath and wheezing.   Cardiovascular: Positive for leg swelling.  Negative for chest pain, palpitations and orthopnea.  Gastrointestinal: Negative for abdominal pain, blood in stool, constipation, diarrhea, heartburn, melena, nausea and vomiting.  Genitourinary: Negative for dysuria, frequency and urgency.  Musculoskeletal: Negative for back pain and joint pain.  Skin: Negative.  Negative for itching and rash.  Neurological: Negative for dizziness, tingling, focal weakness, weakness and headaches.  Endo/Heme/Allergies: Does not bruise/bleed easily.  Psychiatric/Behavioral: Negative for depression. The patient is not nervous/anxious and does not have insomnia.       PAST MEDICAL HISTORY :  Past Medical History:  Diagnosis Date  . Abnormal prostate specific antigen 08/08/2012  . Adiposity 04/16/2015  . Chronic kidney disease (CKD), stage III (moderate) ([place]) 11/27/2016  . CVA (cerebral vascular accident) ([place]) 06/17/2016  . Diabetes mellitus without complication ([place])   . Diverticulosis of sigmoid colon 04/16/2015  . Dyslipidemia 03/18/2015  . Hemorrhoids, internal 04/16/2015  . Hypercholesteremia 04/16/2015  . Hyperlipidemia   . Hypertension   . Primary cancer of left upper lobe of lung ([place])   . Pulmonary embolism ([place])   . Wears dentures    partial upper    PAST SURGICAL HISTORY :   Past Surgical History:  Procedure Laterality Date  . COLONOSCOPY    . COLONOSCOPY WITH PROPOFOL N/A 05/06/2015   Procedure: COLONOSCOPY WITH PROPOFOL;  Surgeon: Lucilla Lame, MD;  Location: [place];  Service: Endoscopy;  Laterality: N/A;  ASCENDING COLON POLYPS X 2 TERMINAL ILEUM BIOPSY RANDOM COLON BX. TRANSVERSE COLON POLYP SIGMOID COLON POLYP  . ESOPHAGOGASTRODUODENOSCOPY (EGD) WITH PROPOFOL N/A 05/06/2015   Procedure: ESOPHAGOGASTRODUODENOSCOPY (EGD) WITH PROPOFOL;  Surgeon: Lucilla Lame, MD;  Location: [place];  Service: Endoscopy;  Laterality:  N/A;  GASTRIC BIOPSY X1  . KIDNEY STONE SURGERY  2017    FAMILY HISTORY :   Family History   Problem Relation Age of Onset  . Diabetes Mother   . Diabetes Father   . CAD Father   . Dementia Father   . Diabetes Sister   . Cancer Maternal Uncle        Prostate  . Cancer Cousin        prostate    SOCIAL HISTORY:   Social History   Tobacco Use  . Smoking status: Never Smoker  . Smokeless tobacco: Never Used  Substance Use Topics  . Alcohol use: No    Alcohol/week: 0.0 standard drinks  . Drug use: No    ALLERGIES:  has No Known Allergies.  MEDICATIONS:  Current Outpatient Medications  Medication Sig Dispense Refill  . atorvastatin (LIPITOR) 40 MG tablet Take 1 tablet (40 mg total) by mouth 3 (three) times a week. 48 tablet 1  . crizotinib (XALKORI) 250 MG capsule Take 1 capsule (250 mg total) by mouth 2 (two) times daily. 60 capsule 3  . finasteride (PROSCAR) 5 MG tablet Take 1 tablet (5 mg total) by mouth at bedtime. 14 tablet 0  . glipiZIDE (GLUCOTROL) 5 MG tablet Take 0.5 tablets (2.5 mg total) by mouth 2 (two) times daily before a meal. (Patient taking differently: Take 2.5 mg by mouth daily before breakfast. ) 180 tablet 0  . metFORMIN (GLUCOPHAGE) 500 MG tablet Take 1 tablet (500 mg total) by mouth 3 (three) times daily with meals. 270 tablet 1  . rivaroxaban (XARELTO) 20 MG TABS tablet Take 1 tablet (20 mg total) by mouth daily with supper. 30 tablet 11  . tamsulosin (FLOMAX) 0.4 MG CAPS capsule Take 0.4 mg by mouth every morning.     . lisinopril (PRINIVIL,ZESTRIL) 10 MG tablet Take 1 tablet (10 mg total) by mouth daily. (Patient not taking: Reported on 06/24/2018) 90 tablet 0   No current facility-administered medications for this visit.     PHYSICAL EXAMINATION: ECOG PERFORMANCE STATUS: 0 - Asymptomatic  BP (!) 153/89 (BP Location: Left Arm, Patient Position: Sitting)   Pulse 61   Temp 97.6 F (36.4 C) (Tympanic)   Resp 16   Wt 160 lb (72.6 kg)   BMI 27.46 kg/m   Filed Weights   06/24/18 1025  Weight: 160 lb (72.6 kg)    Physical Exam   Constitutional: He is oriented to person, place, and time and well-developed, well-nourished, and in no distress.  Alone. He is walking by himself.  HENT:  Head: Normocephalic and atraumatic.  Mouth/Throat: Oropharynx is clear and moist. No oropharyngeal exudate.  Eyes: Pupils are equal, round, and reactive to light.  Neck: Normal range of motion. Neck supple.  Cardiovascular: Normal rate and regular rhythm.  Pulmonary/Chest: No respiratory distress. He has no wheezes.  Abdominal: Soft. Bowel sounds are normal. He exhibits no distension and no mass. There is no tenderness. There is no rebound and no guarding.  Musculoskeletal: Normal range of motion. He exhibits no edema or tenderness.  Neurological: He is alert and oriented to person, place, and time.  Skin: Skin is warm.  Psychiatric: Affect normal.       LABORATORY DATA:  I have reviewed the data as listed    Component Value Date/Time   NA 136 06/24/2018 1007   NA 139 01/01/2016 1000   K 4.4 06/24/2018 1007   CL 105 06/24/2018 1007   CO2 22 06/24/2018   1007   GLUCOSE 140 (H) 06/24/2018 1007   BUN 18 06/24/2018 1007   BUN 13 01/01/2016 1000   CREATININE 1.48 (H) 06/24/2018 1007   CREATININE 1.57 (H) 01/04/2017 0933   CALCIUM 9.1 06/24/2018 1007   PROT 6.9 06/24/2018 1007   PROT 7.2 01/01/2016 1000   ALBUMIN 3.6 06/24/2018 1007   ALBUMIN 4.4 01/01/2016 1000   AST 32 06/24/2018 1007   ALT 52 (H) 06/24/2018 1007   ALKPHOS 87 06/24/2018 1007   BILITOT 0.5 06/24/2018 1007   BILITOT 0.4 01/01/2016 1000   GFRNONAA 48 (L) 06/24/2018 1007   GFRNONAA 46 (L) 01/04/2017 0933   GFRAA 56 (L) 06/24/2018 1007   GFRAA 53 (L) 01/04/2017 0933    No results found for: SPEP, UPEP  Lab Results  Component Value Date   WBC 5.3 06/24/2018   NEUTROABS 2.8 06/24/2018   HGB 13.7 06/24/2018   HCT 42.1 06/24/2018   MCV 82.4 06/24/2018   PLT 260 06/24/2018      Chemistry      Component Value Date/Time   NA 136 06/24/2018 1007    NA 139 01/01/2016 1000   K 4.4 06/24/2018 1007   CL 105 06/24/2018 1007   CO2 22 06/24/2018 1007   BUN 18 06/24/2018 1007   BUN 13 01/01/2016 1000   CREATININE 1.48 (H) 06/24/2018 1007   CREATININE 1.57 (H) 01/04/2017 0933      Component Value Date/Time   CALCIUM 9.1 06/24/2018 1007   ALKPHOS 87 06/24/2018 1007   AST 32 06/24/2018 1007   ALT 52 (H) 06/24/2018 1007   BILITOT 0.5 06/24/2018 1007   BILITOT 0.4 01/01/2016 1000       RADIOGRAPHIC STUDIES: I have personally reviewed the radiological images as listed and agreed with the findings in the report. No results found.   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  Chest x-ray AUG 2019- STABLE.  Reluctant with cross-sectional imaging because of lack of insurance.  # Continue crizotinib 250 mg twice a day; no clinical evidence of progression.  Tolerating well.    # CKD- multifactorial [prostatitic obstruction/diabetes]-creatinine 1.4; STABLE.   # Bil LE swelling: CKD/X crizotinib compression stockings.STABLE.    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   # DISPOSITION: # follow up in 6 week- MD/ labs-cbc/cmp/CXR- 1 day prior - Dr.B   Orders Placed This Encounter  Procedures  . DG Chest 2 View    Standing Status:   Future    Standing Expiration Date:   08/24/2019    Order Specific Question:   Reason for Exam (SYMPTOM  OR DIAGNOSIS REQUIRED)    Answer:   lung cancer    Order Specific Question:   Preferred imaging location?    Answer:   [facility]   All questions were answered. The patient knows to call the clinic with any problems, questions or concerns.       R , MD 06/24/2018 12:51 PM  

## 2018-06-24 NOTE — Assessment & Plan Note (Addendum)
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  Chest x-ray AUG 2019- STABLE.  Reluctant with cross-sectional imaging because of lack of insurance.  # Continue crizotinib 250 mg twice a day; no clinical evidence of progression.  Tolerating well.    # CKD- multifactorial [prostatitic obstruction/diabetes]-creatinine 1.4; STABLE.   # Bil LE swelling: CKD/X crizotinib compression stockings.STABLE.    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   # DISPOSITION: # follow up in 6 week- MD/ labs-cbc/cmp/CXR- 1 day prior - Dr.B

## 2018-08-08 ENCOUNTER — Other Ambulatory Visit: Payer: Self-pay | Admitting: *Deleted

## 2018-08-08 ENCOUNTER — Ambulatory Visit
Admission: RE | Admit: 2018-08-08 | Discharge: 2018-08-08 | Disposition: A | Discharge status: Home / Self Care | Payer: Medicare Other | Source: Ambulatory Visit | Attending: Internal Medicine | Admitting: Internal Medicine

## 2018-08-08 ENCOUNTER — Other Ambulatory Visit: Payer: Self-pay

## 2018-08-08 ENCOUNTER — Inpatient Hospital Stay (HOSPITAL_BASED_OUTPATIENT_CLINIC_OR_DEPARTMENT_OTHER): Payer: Medicare Other | Admitting: Internal Medicine

## 2018-08-08 ENCOUNTER — Encounter: Payer: Self-pay | Admitting: Internal Medicine

## 2018-08-08 ENCOUNTER — Inpatient Hospital Stay: Payer: Medicare Other | Attending: Internal Medicine

## 2018-08-08 DIAGNOSIS — I129 Hypertensive chronic kidney disease with stage 1 through stage 4 chronic kidney disease, or unspecified chronic kidney disease: Secondary | ICD-10-CM | POA: Insufficient documentation

## 2018-08-08 DIAGNOSIS — Z7901 Long term (current) use of anticoagulants: Secondary | ICD-10-CM | POA: Diagnosis not present

## 2018-08-08 DIAGNOSIS — E1122 Type 2 diabetes mellitus with diabetic chronic kidney disease: Secondary | ICD-10-CM | POA: Insufficient documentation

## 2018-08-08 DIAGNOSIS — R05 Cough: Secondary | ICD-10-CM | POA: Diagnosis not present

## 2018-08-08 DIAGNOSIS — Z8673 Personal history of transient ischemic attack (TIA), and cerebral infarction without residual deficits: Secondary | ICD-10-CM | POA: Diagnosis not present

## 2018-08-08 DIAGNOSIS — Z7984 Long term (current) use of oral hypoglycemic drugs: Secondary | ICD-10-CM | POA: Diagnosis not present

## 2018-08-08 DIAGNOSIS — N183 Chronic kidney disease, stage 3 (moderate): Secondary | ICD-10-CM | POA: Diagnosis not present

## 2018-08-08 DIAGNOSIS — C3412 Malignant neoplasm of upper lobe, left bronchus or lung: Secondary | ICD-10-CM

## 2018-08-08 DIAGNOSIS — Z86711 Personal history of pulmonary embolism: Secondary | ICD-10-CM | POA: Diagnosis not present

## 2018-08-08 DIAGNOSIS — C3490 Malignant neoplasm of unspecified part of unspecified bronchus or lung: Secondary | ICD-10-CM | POA: Diagnosis not present

## 2018-08-08 DIAGNOSIS — H9203 Otalgia, bilateral: Secondary | ICD-10-CM

## 2018-08-08 DIAGNOSIS — Z79899 Other long term (current) drug therapy: Secondary | ICD-10-CM | POA: Insufficient documentation

## 2018-08-08 DIAGNOSIS — Z8249 Family history of ischemic heart disease and other diseases of the circulatory system: Secondary | ICD-10-CM | POA: Diagnosis not present

## 2018-08-08 LAB — CBC WITH DIFFERENTIAL/PLATELET
ABS IMMATURE GRANULOCYTES: 0.02 10*3/uL (ref 0.00–0.07)
Basophils Absolute: 0 10*3/uL (ref 0.0–0.1)
Basophils Relative: 0 %
EOS ABS: 0.2 10*3/uL (ref 0.0–0.5)
Eosinophils Relative: 4 %
HEMATOCRIT: 41.1 % (ref 39.0–52.0)
Hemoglobin: 13.4 g/dL (ref 13.0–17.0)
Immature Granulocytes: 0 %
Lymphocytes Relative: 31 %
Lymphs Abs: 1.4 10*3/uL (ref 0.7–4.0)
MCH: 26.9 pg (ref 26.0–34.0)
MCHC: 32.6 g/dL (ref 30.0–36.0)
MCV: 82.4 fL (ref 80.0–100.0)
MONO ABS: 0.8 10*3/uL (ref 0.1–1.0)
Monocytes Relative: 18 %
NRBC: 0 % (ref 0.0–0.2)
Neutro Abs: 2.1 10*3/uL (ref 1.7–7.7)
Neutrophils Relative %: 47 %
Platelets: 318 10*3/uL (ref 150–400)
RBC: 4.99 MIL/uL (ref 4.22–5.81)
RDW: 15.7 % — ABNORMAL HIGH (ref 11.5–15.5)
WBC: 4.5 10*3/uL (ref 4.0–10.5)

## 2018-08-08 LAB — COMPREHENSIVE METABOLIC PANEL
ALT: 49 U/L — AB (ref 0–44)
AST: 30 U/L (ref 15–41)
Albumin: 3.5 g/dL (ref 3.5–5.0)
Alkaline Phosphatase: 95 U/L (ref 38–126)
Anion gap: 8 (ref 5–15)
BILIRUBIN TOTAL: 0.5 mg/dL (ref 0.3–1.2)
BUN: 19 mg/dL (ref 8–23)
CALCIUM: 9 mg/dL (ref 8.9–10.3)
CO2: 22 mmol/L (ref 22–32)
CREATININE: 1.48 mg/dL — AB (ref 0.61–1.24)
Chloride: 104 mmol/L (ref 98–111)
GFR calc Af Amer: 57 mL/min — ABNORMAL LOW (ref >60)
GFR, EST NON AFRICAN AMERICAN: 49 mL/min — AB (ref >60)
Glucose, Bld: 166 mg/dL — ABNORMAL HIGH (ref 70–99)
Potassium: 4.8 mmol/L (ref 3.5–5.1)
Sodium: 134 mmol/L — ABNORMAL LOW (ref 135–145)
TOTAL PROTEIN: 7.2 g/dL (ref 6.5–8.1)

## 2018-08-08 NOTE — Patient Instructions (Signed)
#  Take Claritin once a day for " head cold" x7 days.

## 2018-08-08 NOTE — Assessment & Plan Note (Addendum)
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  Chest x-ray DEC 2nd 2019- STABLE.  # Continue crizotinib 250 mg twice a day; no clinical evidence of progression.  Tolerating well.  Will get CT scan prior to next visit.   # CKD- multifactorial [prostatitic obstruction/diabetes]-creatinine 1.4; stable  # Bil LE swelling: CKD/X crizotinib compression stockings stalbe.     # URI-/bilateral earache- reocmmend claritin once a day.   # Bilateral PE & left lower extremity DVT:  on Xarelto; stable.   # DISPOSITION: # follow up in 6 week- MD/ labs-cbc/cmp; CT chest prior-  Dr.B 

## 2018-08-08 NOTE — Progress Notes (Signed)
[facility] OFFICE PROGRESS NOTE  Patient Care Team: [name], [name], MD as Medical Oncologist (Medical Oncology)  Cancer Staging No matching staging information was found for the patient.   Oncology History   # OCT 2017- ADENO CA LUNG; STAGE IV [; LUL; bil supraclavicular LN; Left neck LN Bx]; ROS-1 MUTATED; s/p Carbo-alimta x1[oct 2017]  # NOV 1st 2017- XALKORI 250 mg BID; JAN 15th CT- PR;  # AUG 27th Chemo-RT to persistent LUL/mediastinal LN [s/p carbo-taxol- with RT; finished Sep 22nd 2018]  # LLE DVT/bil PE/Multiple strokes [? On xarelto]-Lovenox; Jan mid 2018- xarelto [lovenox-insurance issues]  # MRI brain- multiple infarcts [2d echo/bubble study-NEG's/p Neurology eval]  ------------------------------------------------------------    # # s/p TURP [Sep 2017; Dr.Cope] DEC 26th CT- distended bladder   # MOLECULAR TESTING- ROS-1 POSITIVE; ALK/EGFR-NEG; PDL-1 EXPRESSION- 90%** [HIGH] -----------------------------------------------------------------    DIAGNOSIS: Adenocarcinoma the lung  ROS-1 +  STAGE:  IV       ;GOALS: Palliative  CURRENT/MOST RECENT THERAPY- CRIZOTINIB     Primary cancer of left upper lobe of lung ([place])      INTERVAL HISTORY:  Melvin Willis 67 y.o.  male [facility] patient above history of metastatic lung cancer-Ros: Positive currently on crizotinib is here for follow-up.  Denies any worsening shortness of breath or cough.  Complains of bilateral earache/runny nose.  No headaches nausea or vomiting  Review of Systems  Constitutional: Negative for chills, diaphoresis, fever, malaise/fatigue and weight loss.  HENT: Positive for congestion and ear pain. Negative for nosebleeds and sore throat.   Eyes: Negative for double vision.  Respiratory: Negative for cough, hemoptysis, sputum production, shortness of breath and wheezing.   Cardiovascular: Negative for chest pain,  palpitations and orthopnea.  Gastrointestinal: Negative for abdominal pain, blood in stool, constipation, diarrhea, heartburn, melena, nausea and vomiting.  Genitourinary: Negative for dysuria, frequency and urgency.  Musculoskeletal: Negative for back pain and joint pain.  Skin: Negative.  Negative for itching and rash.  Neurological: Negative for dizziness, tingling, focal weakness, weakness and headaches.  Endo/Heme/Allergies: Does not bruise/bleed easily.  Psychiatric/Behavioral: Negative for depression. The patient is not nervous/anxious and does not have insomnia.       PAST MEDICAL HISTORY :  Past Medical History:  Diagnosis Date  . Abnormal prostate specific antigen 08/08/2012  . Adiposity 04/16/2015  . Chronic kidney disease (CKD), stage III (moderate) ([place]) 11/27/2016  . CVA (cerebral vascular accident) ([place]) 06/17/2016  . Diabetes mellitus without complication ([place])   . Diverticulosis of sigmoid colon 04/16/2015  . Dyslipidemia 03/18/2015  . Hemorrhoids, internal 04/16/2015  . Hypercholesteremia 04/16/2015  . Hyperlipidemia   . Hypertension   . Primary cancer of left upper lobe of lung ([place])   . Pulmonary embolism ([place])   . Wears dentures    partial upper    PAST SURGICAL HISTORY :   Past Surgical History:  Procedure Laterality Date  . COLONOSCOPY    . COLONOSCOPY WITH PROPOFOL N/A 05/06/2015   Procedure: COLONOSCOPY WITH PROPOFOL;  Surgeon: Lucilla Lame, MD;  Location: [facility];  Service: Endoscopy;  Laterality: N/A;  ASCENDING COLON POLYPS X 2 TERMINAL ILEUM BIOPSY RANDOM COLON BX. TRANSVERSE COLON POLYP SIGMOID COLON POLYP  . ESOPHAGOGASTRODUODENOSCOPY (EGD) WITH PROPOFOL N/A 05/06/2015   Procedure: ESOPHAGOGASTRODUODENOSCOPY (EGD) WITH PROPOFOL;  Surgeon: Lucilla Lame, MD;  Location: [facility];  Service: Endoscopy;  Laterality: N/A;  GASTRIC BIOPSY X1  . KIDNEY STONE SURGERY  2017    FAMILY HISTORY :   Family History  Problem Relation Age of Onset   . Diabetes Mother   . Diabetes Father   . CAD Father   . Dementia Father   . Diabetes Sister   . Cancer Maternal Uncle        Prostate  . Cancer Cousin        prostate    SOCIAL HISTORY:   Social History   Tobacco Use  . Smoking status: Never Smoker  . Smokeless tobacco: Never Used  Substance Use Topics  . Alcohol use: No    Alcohol/week: 0.0 standard drinks  . Drug use: No    ALLERGIES:  has No Known Allergies.  MEDICATIONS:  Current Outpatient Medications  Medication Sig Dispense Refill  . atorvastatin (LIPITOR) 40 MG tablet Take 1 tablet (40 mg total) by mouth 3 (three) times a week. 48 tablet 1  . crizotinib (XALKORI) 250 MG capsule Take 1 capsule (250 mg total) by mouth 2 (two) times daily. 60 capsule 3  . finasteride (PROSCAR) 5 MG tablet Take 1 tablet (5 mg total) by mouth at bedtime. 14 tablet 0  . glipiZIDE (GLUCOTROL) 5 MG tablet Take 0.5 tablets (2.5 mg total) by mouth 2 (two) times daily before a meal. (Patient taking differently: Take 2.5 mg by mouth daily before breakfast. ) 180 tablet 0  . metFORMIN (GLUCOPHAGE) 500 MG tablet Take 1 tablet (500 mg total) by mouth 3 (three) times daily with meals. 270 tablet 1  . rivaroxaban (XARELTO) 20 MG TABS tablet Take 1 tablet (20 mg total) by mouth daily with supper. 30 tablet 11  . tamsulosin (FLOMAX) 0.4 MG CAPS capsule Take 0.4 mg by mouth every morning.      No current facility-administered medications for this visit.     PHYSICAL EXAMINATION: ECOG PERFORMANCE STATUS: 0 - Asymptomatic  BP (!) 172/84 (Patient Position: Sitting)   Pulse 71   Temp 98.5 F (36.9 C) (Oral)   Resp 20   Ht '5\' 4"'  (1.626 m)   Wt 172 lb (78 kg)   BMI 29.52 kg/m   Filed Weights   08/08/18 1037  Weight: 172 lb (78 kg)    Physical Exam  Constitutional: He is oriented to person, place, and time and well-developed, well-nourished, and in no distress.  Alone. He is walking by himself.  HENT:  Head: Normocephalic and atraumatic.   Mouth/Throat: Oropharynx is clear and moist. No oropharyngeal exudate.  Eyes: Pupils are equal, round, and reactive to light.  Neck: Normal range of motion. Neck supple.  Cardiovascular: Normal rate and regular rhythm.  Pulmonary/Chest: No respiratory distress. He has no wheezes.  Abdominal: Soft. Bowel sounds are normal. He exhibits no distension and no mass. There is no tenderness. There is no rebound and no guarding.  Musculoskeletal: Normal range of motion. He exhibits no edema or tenderness.  Neurological: He is alert and oriented to person, place, and time.  Skin: Skin is warm.  Psychiatric: Affect normal.       LABORATORY DATA:  I have reviewed the data as listed    Component Value Date/Time   NA 134 (L) 08/08/2018 1005   NA 139 01/01/2016 1000   K 4.8 08/08/2018 1005   CL 104 08/08/2018 1005   CO2 22 08/08/2018 1005   GLUCOSE 166 (H) 08/08/2018 1005   BUN 19 08/08/2018 1005   BUN 13 01/01/2016 1000   CREATININE 1.48 (H) 08/08/2018 1005   CREATININE 1.57 (H)  01/04/2017 0933   CALCIUM 9.0 08/08/2018 1005   PROT 7.2 08/08/2018 1005   PROT 7.2 01/01/2016 1000   ALBUMIN 3.5 08/08/2018 1005   ALBUMIN 4.4 01/01/2016 1000   AST 30 08/08/2018 1005   ALT 49 (H) 08/08/2018 1005   ALKPHOS 95 08/08/2018 1005   BILITOT 0.5 08/08/2018 1005   BILITOT 0.4 01/01/2016 1000   GFRNONAA 49 (L) 08/08/2018 1005   GFRNONAA 46 (L) 01/04/2017 0933   GFRAA 57 (L) 08/08/2018 1005   GFRAA 53 (L) 01/04/2017 0933    No results found for: SPEP, UPEP  Lab Results  Component Value Date   WBC 4.5 08/08/2018   NEUTROABS 2.1 08/08/2018   HGB 13.4 08/08/2018   HCT 41.1 08/08/2018   MCV 82.4 08/08/2018   PLT 318 08/08/2018      Chemistry      Component Value Date/Time   NA 134 (L) 08/08/2018 1005   NA 139 01/01/2016 1000   K 4.8 08/08/2018 1005   CL 104 08/08/2018 1005   CO2 22 08/08/2018 1005   BUN 19 08/08/2018 1005   BUN 13 01/01/2016 1000   CREATININE 1.48 (H) 08/08/2018 1005    CREATININE 1.57 (H) 01/04/2017 0933      Component Value Date/Time   CALCIUM 9.0 08/08/2018 1005   ALKPHOS 95 08/08/2018 1005   AST 30 08/08/2018 1005   ALT 49 (H) 08/08/2018 1005   BILITOT 0.5 08/08/2018 1005   BILITOT 0.4 01/01/2016 1000       RADIOGRAPHIC STUDIES: I have personally reviewed the radiological images as listed and agreed with the findings in the report. Dg Chest 2 View  Result Date: 08/08/2018 CLINICAL DATA:  Cough and congestion for 1 week. History of lung cancer. EXAM: CHEST - 2 VIEW COMPARISON:  PA and lateral chest 05/12/2018 and 02/10/2018. FINDINGS: Left upper lobe scar and volume loss in the left chest consistent with post treatment change for carcinoma is again seen. Lungs are otherwise clear. Heart size is normal. No pneumothorax or pleural effusion. No acute or focal bony abnormality. IMPRESSION: No acute disease. Post treatment change left chest. Electronically Signed   By: Inge Rise M.D.   On: 08/08/2018 09:13     ASSESSMENT & PLAN:  Primary cancer of left upper lobe of lung [facility]) # Adenocarcinoma of the lung metastatic/stage IV-ROS-1 positive: CT scan August 20, 2017-left upper lobe radiation changes no progressive disease.  Chest x-ray DEC 2nd 2019- STABLE.  # Continue crizotinib 250 mg twice a day; no clinical evidence of progression.  Tolerating well.  Will get CT scan prior to next visit.   # CKD- multifactorial [prostatitic obstruction/diabetes]-creatinine 1.4; stable  # Bil LE swelling: CKD/X crizotinib compression stockings stalbe.     # URI-/bilateral earache- reocmmend claritin once a day.   # Bilateral PE & left lower extremity DVT:  on Xarelto; stable.   # DISPOSITION: # follow up in 6 week- MD/ labs-cbc/cmp; CT chest prior-  Dr.B   Orders Placed This Encounter  Procedures  . CT CHEST WO CONTRAST    Standing Status:   Future    Standing Expiration Date:   08/09/2019    Order Specific Question:   Preferred imaging location?     Answer:   [facility]    Order Specific Question:   Radiology Contrast Protocol - do NOT remove file path    Answer:   [email or web address]    Order Specific Question:   ** REASON FOR EXAM (FREE TEXT)  Answer:   lung cancer on chemo   All questions were answered. The patient knows to call the clinic with any problems, questions or concerns.      Cammie Sickle, MD 08/08/2018 4:50 PM

## 2018-08-22 ENCOUNTER — Ambulatory Visit (INDEPENDENT_AMBULATORY_CARE_PROVIDER_SITE_OTHER): Payer: Medicare Other | Admitting: Family Medicine

## 2018-08-22 ENCOUNTER — Encounter: Payer: Self-pay | Admitting: Family Medicine

## 2018-08-22 VITALS — BP 150/80 | HR 67 | Temp 98.0°F | Resp 16 | Ht 64.0 in | Wt 171.2 lb

## 2018-08-22 DIAGNOSIS — C3412 Malignant neoplasm of upper lobe, left bronchus or lung: Secondary | ICD-10-CM | POA: Diagnosis not present

## 2018-08-22 DIAGNOSIS — C3490 Malignant neoplasm of unspecified part of unspecified bronchus or lung: Secondary | ICD-10-CM | POA: Diagnosis not present

## 2018-08-22 DIAGNOSIS — Z23 Encounter for immunization: Secondary | ICD-10-CM | POA: Diagnosis not present

## 2018-08-22 DIAGNOSIS — E1122 Type 2 diabetes mellitus with diabetic chronic kidney disease: Secondary | ICD-10-CM

## 2018-08-22 DIAGNOSIS — N183 Chronic kidney disease, stage 3 unspecified: Secondary | ICD-10-CM

## 2018-08-22 DIAGNOSIS — Z8673 Personal history of transient ischemic attack (TIA), and cerebral infarction without residual deficits: Secondary | ICD-10-CM | POA: Diagnosis not present

## 2018-08-22 DIAGNOSIS — N403 Nodular prostate with lower urinary tract symptoms: Secondary | ICD-10-CM

## 2018-08-22 DIAGNOSIS — N138 Other obstructive and reflux uropathy: Secondary | ICD-10-CM

## 2018-08-22 DIAGNOSIS — E7800 Pure hypercholesterolemia, unspecified: Secondary | ICD-10-CM | POA: Diagnosis not present

## 2018-08-22 DIAGNOSIS — Z0000 Encounter for general adult medical examination without abnormal findings: Secondary | ICD-10-CM | POA: Diagnosis not present

## 2018-08-22 DIAGNOSIS — H6991 Unspecified Eustachian tube disorder, right ear: Secondary | ICD-10-CM

## 2018-08-22 MED ORDER — ATORVASTATIN CALCIUM 40 MG PO TABS: 40.0000 mg | ORAL_TABLET | ORAL | 1 refills | Status: DC

## 2018-08-22 MED ORDER — METFORMIN HCL 500 MG PO TABS: 500.0000 mg | ORAL_TABLET | Freq: Two times a day (BID) | ORAL | 0 refills | Status: DC

## 2018-08-22 MED ORDER — FLUTICASONE PROPIONATE 50 MCG/ACT NA SUSP: 2.0000 | Freq: Every day | NASAL | 1 refills | Status: DC

## 2018-08-22 NOTE — Progress Notes (Addendum)
Patient: Melvin Willis, Male    DOB: 03-24-1951, 67 y.o.   MRN: [id number]  Visit Date: 08/22/2018  Today's Provider: Loistine Chance, MD   Chief Complaint  Patient presents with  . Medicare Wellness    welcome    Subjective:   Melvin Willis is a 67 y.o. male who presents today for his Welcome to medicare and follow up  Patient/Caregiver input:  None  HPI   DMII: he still only taking metformin twice daly, could not tolerate a higher dose because if caused nausea and vomiting, last hgbA1C was 7.7% ,  8.8% , 8.2% 8.1% . He is back on glipizide but only takes less than half in am's, he states higher dose causes hypoglycemia, it goes down to 80' before dinner, he states he skips lunch. FSBS in am's has been 120's.He likes desserts, eats it daily, only exercising once a day. He stopped lisinopril for kidney protection because it caused dizziness. Taking lipitor a few times a week and is due for an eye exam, and now that he has medicare he will go back for it.   Chronic DVT left leg, history of PE : on long term Xarelto, rx given by Hematologist. He denies easy bruising. He was diagnosed with DVT and PE prior to cancer diagnosis back in 2017. He has some leg edema and wears compression stocking hoses daily . 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 Mickel Fuchs.  He is compliant with regiment and follow ups, he has intermittent chest pain, like gas that lasts seconds, radiation was on left chest and Dr. Mickel Fuchs thinks related to scarring or inflammation.   URI: recently  Noticed nasal congestion followed by some fullness and some hearing loss on right side, he is taking loratadine but still has symptoms.   History of CVA: initially had dizziness and left side weakness, but no sequela now, still on statin and also on Xarelto, only takes statins a few times a week. Unchanged, we will  recheck labs today   Review of Systems Constitutional: Negative for fever or weight change.  Respiratory: Negative for cough and shortness of breath.   Cardiovascular: Negative for chest pain or palpitations.  Gastrointestinal: Negative for abdominal pain, no bowel changes.  Musculoskeletal: Negative for gait problem or joint swelling.  Skin: Negative for rash.  Neurological: Negative for dizziness or headache.  No other specific complaints in a complete review of systems (except as listed in HPI above).  Past Medical History:  Diagnosis Date  . Abnormal prostate specific antigen 08/08/2012  . Adiposity 04/16/2015  . Chronic kidney disease (CKD), stage III (moderate) ([place]) 11/27/2016  . CVA (cerebral vascular accident) ([place]) 06/17/2016  . Diabetes mellitus without complication ([place])   . Diverticulosis of sigmoid colon 04/16/2015  . Dyslipidemia 03/18/2015  . Hemorrhoids, internal 04/16/2015  . Hypercholesteremia 04/16/2015  . Hyperlipidemia   . Hypertension   . Primary cancer of left upper lobe of lung ([place])   . Pulmonary embolism ([place])   . Wears dentures    partial upper    Past Surgical History:  Procedure Laterality Date  . COLONOSCOPY    . COLONOSCOPY WITH PROPOFOL N/A 05/06/2015   Procedure: COLONOSCOPY WITH PROPOFOL;  Surgeon: Lucilla Lame, MD;  Location: [facility];  Service: Endoscopy;  Laterality: N/A;  ASCENDING COLON POLYPS X 2 TERMINAL ILEUM BIOPSY RANDOM COLON BX. TRANSVERSE COLON POLYP  SIGMOID COLON POLYP  . ESOPHAGOGASTRODUODENOSCOPY (EGD) WITH PROPOFOL N/A 05/06/2015   Procedure: ESOPHAGOGASTRODUODENOSCOPY (EGD) WITH PROPOFOL;  Surgeon: Lucilla Lame, MD;  Location: [facility];  Service: Endoscopy;  Laterality: N/A;  GASTRIC BIOPSY X1  . KIDNEY STONE SURGERY  2017    Family History  Problem Relation Age of Onset  . Diabetes Mother   . Diabetes Father   . CAD Father   . Dementia Father   . Diabetes Sister   . Cancer Maternal Uncle        Prostate   . Cancer Cousin        prostate    Social History   Socioeconomic History  . Marital status: Married    Spouse name: Melvin Willis   . Number of children: 3  . Years of education: Not on file  . Highest education level: Not on file  Occupational History  . Occupation: disable     Comment: from CVA and lung cancer  Social Needs  . Financial resource strain: Somewhat hard  . Food insecurity:    Worry: Never true    Inability: Never true  . Transportation needs:    Medical: No    Non-medical: No  Tobacco Use  . Smoking status: Never Smoker  . Smokeless tobacco: Never Used  Substance and Sexual Activity  . Alcohol use: No    Alcohol/week: 0.0 standard drinks  . Drug use: No  . Sexual activity: Yes    Partners: Female  Lifestyle  . Physical activity:    Days per week: 1 day    Minutes per session: 40 min  . Stress: Not at all  Relationships  . Social connections:    Talks on phone: More than three times a week    Gets together: Three times a week    Attends religious service: More than 4 times per year    Active member of club or organization: No    Attends meetings of clubs or organizations: Never    Relationship status: Married  . Intimate partner violence:    Fear of current or ex partner: No    Emotionally abused: No    Physically abused: No    Forced sexual activity: No  Other Topics Concern  . Not on file  Social History Narrative   Used to work until 2 years ago when got sick with DVT leg , PE , CVA and found out he had lung cancer. He has medicare now     Outpatient Encounter Medications as of 08/22/2018  Medication Sig  . atorvastatin (LIPITOR) 40 MG tablet Take 1 tablet (40 mg total) by mouth 3 (three) times a week.  . crizotinib (XALKORI) 250 MG capsule Take 1 capsule (250 mg total) by mouth 2 (two) times daily.  . finasteride (PROSCAR) 5 MG tablet Take 1 tablet (5 mg total) by mouth at bedtime.  Marland Kitchen glipiZIDE (GLUCOTROL) 5 MG tablet Take 0.5 tablets (2.5  mg total) by mouth 2 (two) times daily before a meal. (Patient taking differently: Take 2.5 mg by mouth daily before breakfast. )  . metFORMIN (GLUCOPHAGE) 500 MG tablet Take 1 tablet (500 mg total) by mouth 2 (two) times daily with a meal.  . rivaroxaban (XARELTO) 20 MG TABS tablet Take 1 tablet (20 mg total) by mouth daily with supper.  . tamsulosin (FLOMAX) 0.4 MG CAPS capsule Take 0.4 mg by mouth every morning.   . [DISCONTINUED] atorvastatin (LIPITOR) 40 MG tablet Take 1 tablet (40 mg total) by  mouth 3 (three) times a week.  . [DISCONTINUED] metFORMIN (GLUCOPHAGE) 500 MG tablet Take 1 tablet (500 mg total) by mouth 3 (three) times daily with meals.  . fluticasone (FLONASE) 50 MCG/ACT nasal spray Place 2 sprays into both nostrils daily.   No facility-administered encounter medications on file as of 08/22/2018.     No Known Allergies  Care Team Updated in EHR: Yes  Last Vision Exam:  2018 Wears corrective lenses: Yes - reading  Last Dental Exam: 2017 , needs to go back  Last Hearing Exam: Wears Hearing Aids: No  Functional Ability / Safety Screening 1.  Was the timed Get Up and Go test longer than 30 seconds?  yes 2.  Does the patient need help with the phone, transportation, shopping,      preparing meals, housework, laundry, medications, or managing money?  yes 3.  Does the patient's home have:  loose throw rugs in the hallway?   no      Grab bars in the bathroom? Yes       Handrails on the stairs?   yes      Poor lighting?   no 4.  Has the patient noticed any hearing difficulties?   yes this past week because of a cold   Diet Recall and Exercise Regimen: not compliant with diabetic diet, having too many desserts - daily.   Fall Risk: See screening under Objective Information  Depression Screen:  See screening under Objective Information  Advanced Directives: A voluntary discussion about advance care planning including the explanation and discussion of advance directives was  discussed with the patient. Explanation about the health care proxy and living will was reviewed.  During this discussion, the patient was able to identify a health care proxy as wife, and plans/does not plan to fill out the paperwork required and will bring this to our office to keep on file. Does patient have a HCPOA?    no Does patient have a living will or MOST form?  no  Cancer Screenings: Skin: discussed atypical lesions   Prostate: history of BPH, recheck PSA and refer to Urologist  Colon: up to date   Additional Screenings:  Hepatitis B/HIV/Syphillis: not interested Hepatitis C Screening: is up to date  Intimate Partner Violence: negative screen  AAA Screen: Men age 35 to 46 years if ever smoked recommended to get a one time AAA ultrasound screening exam. Patient does not qualify.  Objective:   Vitals: BP (!) 150/80 (BP Location: Left Arm, Patient Position: Sitting, Cuff Size: Normal)   Pulse 67   Temp 98 F (36.7 C) (Oral)   Resp 16   Ht '5\' 4"'  (1.626 m)   Wt 171 lb 3.2 oz (77.7 kg)   SpO2 98%   BMI 29.39 kg/m  Body mass index is 29.39 kg/m.  Lab Results  Component Value Date   CHOL 133 07/02/2017   CHOL 129 01/04/2017   CHOL 107 06/25/2016   Lab Results  Component Value Date   HDL 54 07/02/2017   HDL 49 01/04/2017   HDL 38 (L) 06/25/2016   Lab Results  Component Value Date   LDLCALC 65 07/02/2017   LDLCALC 64 01/04/2017   LDLCALC 54 06/25/2016   Lab Results  Component Value Date   TRIG 67 07/02/2017   TRIG 81 01/04/2017   TRIG 75 06/25/2016   Lab Results  Component Value Date   CHOLHDL 2.5 07/02/2017   CHOLHDL 2.6 01/04/2017   CHOLHDL 2.8 06/25/2016  No results found for: LDLDIRECT   Hearing Screening   '125Hz'  '250Hz'  '500Hz'  '1000Hz'  '2000Hz'  '3000Hz'  '4000Hz'  '6000Hz'  '8000Hz'   Right ear:   Pass Pass Pass  Fail    Left ear:   Pass Pass Pass  Pass      Visual Acuity Screening   Right eye Left eye Both eyes  Without correction: '20/20 20/20 20/20 '  With  correction:       Physical Exam Constitutional: Patient appears well-developed and well-nourished. Obese  No distress.  HEENT: head atraumatic, normocephalic, pupils equal and reactive to light, ears : normal TM, neck supple, throat within normal limits Cardiovascular: Normal rate, regular rhythm and normal heart sounds.  No murmur heard. No BLE edema. Pulmonary/Chest: Effort normal and breath sounds normal. No respiratory distress. Abdominal: Soft.  There is no tenderness. Psychiatric: Patient has a normal mood and affect. behavior is normal. Judgment and thought content normal.   Cognitive Testing - 6-CIT  Correct? Score   What year is it? yes 0 Yes = 0    No = 4  What month is it? yes 0 Yes = 0    No = 3  Remember:     Pia [address]au, [place], [place]     What time is it? yes 0 Yes = 0    No = 3  Count backwards from 20 to 1 yes 0 Correct = 0    1 error = 2   More than 1 error = 4  Say the months of the year in reverse. yes 0 Correct = 0    1 error = 2   More than 1 error = 4  What address did I ask you to remember? yes 0 Correct = 0  1 error = 2    2 error = 4    3 error = 6    4 error = 8    All wrong = 10       TOTAL SCORE  0/28   Interpretation:  Normal  Normal (0-7) Abnormal (8-28)   Fall Risk: Fall Risk  08/22/2018 04/07/2018 01/05/2018 10/01/2017 08/19/2017  Falls in the past year? 0 No No No No    Depression Screen Depression screen [facility] 2/9 08/22/2018 04/07/2018 01/05/2018 10/01/2017 08/19/2017  Decreased Interest 0 0 1 0 0  Down, Depressed, Hopeless 0 0 0 0 0  PHQ - 2 Score 0 0 1 0 0  Altered sleeping - 0 0 - -  Tired, decreased energy - 1 1 - -  Change in appetite - 0 1 - -  Feeling bad or failure about yourself  - 0 - - -  Trouble concentrating - 0 0 - -  Moving slowly or fidgety/restless - 0 0 - -  Suicidal thoughts - 0 0 - -  PHQ-9 Score - 1 3 - -  Difficult doing work/chores - Not difficult at all Not difficult at all - -    Recent Results (from the past 2160  hour(s))  Comprehensive metabolic panel     Status: Abnormal   Collection Time: 06/24/18 10:07 AM  Result Value Ref Range   Sodium 136 135 - 145 mmol/L   Potassium 4.4 3.5 - 5.1 mmol/L   Chloride 105 98 - 111 mmol/L   CO2 22 22 - 32 mmol/L   Glucose, Bld 140 (H) 70 - 99 mg/dL   BUN 18 8 - 23 mg/dL   Creatinine, Ser 1.48 (H) 0.61 - 1.24 mg/dL  Calcium 9.1 8.9 - 10.3 mg/dL   Total Protein 6.9 6.5 - 8.1 g/dL   Albumin 3.6 3.5 - 5.0 g/dL   AST 32 15 - 41 U/L   ALT 52 (H) 0 - 44 U/L   Alkaline Phosphatase 87 38 - 126 U/L   Total Bilirubin 0.5 0.3 - 1.2 mg/dL   GFR calc non Af Amer 48 (L) >60 mL/min   GFR calc Af Amer 56 (L) >60 mL/min    Comment: (NOTE) The eGFR has been calculated using the CKD EPI equation. This calculation has not been validated in all clinical situations. eGFR's persistently <60 mL/min signify possible Chronic Kidney Disease.    Anion gap 9 5 - 15    Comment: Performed at [facility], [address] [place], [place] [address]  CBC with Differential/Platelet     Status: Abnormal   Collection Time: 06/24/18 10:07 AM  Result Value Ref Range   WBC 5.3 4.0 - 10.5 K/uL   RBC 5.11 4.22 - 5.81 MIL/uL   Hemoglobin 13.7 13.0 - 17.0 g/dL   HCT 42.1 39.0 - 52.0 %   MCV 82.4 80.0 - 100.0 fL   MCH 26.8 26.0 - 34.0 pg   MCHC 32.5 30.0 - 36.0 g/dL   RDW 15.9 (H) 11.5 - 15.5 %   Platelets 260 150 - 400 K/uL   nRBC 0.0 0.0 - 0.2 %   Neutrophils Relative % 53 %   Neutro Abs 2.8 1.7 - 7.7 K/uL   Lymphocytes Relative 30 %   Lymphs Abs 1.6 0.7 - 4.0 K/uL   Monocytes Relative 13 %   Monocytes Absolute 0.7 0.1 - 1.0 K/uL   Eosinophils Relative 4 %   Eosinophils Absolute 0.2 0.0 - 0.5 K/uL   Basophils Relative 0 %   Basophils Absolute 0.0 0.0 - 0.1 K/uL   Immature Granulocytes 0 %   Abs Immature Granulocytes 0.02 0.00 - 0.07 K/uL    Comment: Performed at [facility], [address] [place], [place] [address]  CBC with Differential     Status: Abnormal    Collection Time: 08/08/18 10:05 AM  Result Value Ref Range   WBC 4.5 4.0 - 10.5 K/uL   RBC 4.99 4.22 - 5.81 MIL/uL   Hemoglobin 13.4 13.0 - 17.0 g/dL   HCT 41.1 39.0 - 52.0 %   MCV 82.4 80.0 - 100.0 fL   MCH 26.9 26.0 - 34.0 pg   MCHC 32.6 30.0 - 36.0 g/dL   RDW 15.7 (H) 11.5 - 15.5 %   Platelets 318 150 - 400 K/uL   nRBC 0.0 0.0 - 0.2 %   Neutrophils Relative % 47 %   Neutro Abs 2.1 1.7 - 7.7 K/uL   Lymphocytes Relative 31 %   Lymphs Abs 1.4 0.7 - 4.0 K/uL   Monocytes Relative 18 %   Monocytes Absolute 0.8 0.1 - 1.0 K/uL   Eosinophils Relative 4 %   Eosinophils Absolute 0.2 0.0 - 0.5 K/uL   Basophils Relative 0 %   Basophils Absolute 0.0 0.0 - 0.1 K/uL   Immature Granulocytes 0 %   Abs Immature Granulocytes 0.02 0.00 - 0.07 K/uL    Comment: Performed at [facility], [address] [place], [place] [address]  Comprehensive metabolic panel     Status: Abnormal   Collection Time: 08/08/18 10:05 AM  Result Value Ref Range   Sodium 134 (L) 135 - 145 mmol/L   Potassium 4.8 3.5 - 5.1 mmol/L  Chloride 104 98 - 111 mmol/L   CO2 22 22 - 32 mmol/L   Glucose, Bld 166 (H) 70 - 99 mg/dL   BUN 19 8 - 23 mg/dL   Creatinine, Ser 1.48 (H) 0.61 - 1.24 mg/dL   Calcium 9.0 8.9 - 10.3 mg/dL   Total Protein 7.2 6.5 - 8.1 g/dL   Albumin 3.5 3.5 - 5.0 g/dL   AST 30 15 - 41 U/L   ALT 49 (H) 0 - 44 U/L   Alkaline Phosphatase 95 38 - 126 U/L   Total Bilirubin 0.5 0.3 - 1.2 mg/dL   GFR calc non Af Amer 49 (L) >60 mL/min   GFR calc Af Amer 57 (L) >60 mL/min   Anion gap 8 5 - 15    Comment: Performed at [facility], [address] [place], [place] [address]     Assessment & Plan:     1. Welcome to Medicare preventive visit   Hearing Screening   '125Hz'  '250Hz'  '500Hz'  '1000Hz'  '2000Hz'  '3000Hz'  '4000Hz'  '6000Hz'  '8000Hz'   Right ear:   Pass Pass Pass  Fail    Left ear:   Pass Pass Pass  Pass      Visual Acuity Screening   Right eye Left eye Both eyes  Without correction: 20/20 20/20  20/20  With correction:       2. Need for vaccination for pneumococcus  - Pneumococcal conjugate vaccine 13-valent IM  3. Old cerebrovascular accident (CVA) without late effect   4. Nodular prostate with urinary obstruction  - PSA - Ambulatory referral to Urology  5. CKD (chronic kidney disease), stage III (HCC)  - COMPLETE METABOLIC PANEL WITH GFR - VITAMIN D 25 Hydroxy (Vit-D Deficiency, Fractures)  6. Controlled type 2 diabetes mellitus with stage 3 chronic kidney disease, without long-term current use of insulin (HCC)  - Hemoglobin A1c  7. Primary cancer of left upper lobe of lung ([place])  Under the care of oncologist   8. Primary malignant neoplasm of lung metastatic to other site, unspecified laterality ([place])   9. Hypercholesteremia  - Lipid panel  10. Eustachian tube disorder, right  Advised him to call back for referral to ENT if no improvement of ear fullness with medication. Failed hearing test on right side  - fluticasone (FLONASE) 50 MCG/ACT nasal spray; Place 2 sprays into both nostrils daily.  Dispense: 16 g; Refill: 1  Exercise Activities and Dietary recommendations - he will only eat desserts/sweets 3 times weekly instead of daily - he will increase exercise every day that he eats desserts for one hour.   Discussed health benefits of physical activity, and encouraged him to engage in regular exercise appropriate for his age and condition.   Immunization History  Administered Date(s) Administered  . Influenza,inj,Quad PF,6+ Mos 06/04/2016  . Pneumococcal Conjugate-13 08/22/2018  . Pneumococcal Polysaccharide-23 01/18/2012  . Tdap 01/18/2012  . Zoster 04/03/2016    Health Maintenance  Topic Date Due  . OPHTHALMOLOGY EXAM  10/27/2017  . PNA vac Low Risk Adult (1 of 2 - PCV13) 08/17/2018  . HIV Screening  08/23/2019 (Originally 08/17/1966)  . HEMOGLOBIN A1C  10/08/2018  . FOOT EXAM  01/06/2019  . URINE MICROALBUMIN  01/06/2019  . COLONOSCOPY   05/05/2020  . TETANUS/TDAP  01/17/2022  . INFLUENZA VACCINE  Completed  . Hepatitis C Screening  Completed     Meds ordered this encounter  Medications  . atorvastatin (LIPITOR) 40 MG tablet    Sig: Take 1 tablet (40 mg  total) by mouth 3 (three) times a week.    Dispense:  48 tablet    Refill:  1  . metFORMIN (GLUCOPHAGE) 500 MG tablet    Sig: Take 1 tablet (500 mg total) by mouth 2 (two) times daily with a meal.    Dispense:  180 tablet    Refill:  0  . fluticasone (FLONASE) 50 MCG/ACT nasal spray    Sig: Place 2 sprays into both nostrils daily.    Dispense:  16 g    Refill:  1    Current Outpatient Medications:  .  atorvastatin (LIPITOR) 40 MG tablet, Take 1 tablet (40 mg total) by mouth 3 (three) times a week., Disp: 48 tablet, Rfl: 1 .  crizotinib (XALKORI) 250 MG capsule, Take 1 capsule (250 mg total) by mouth 2 (two) times daily., Disp: 60 capsule, Rfl: 3 .  finasteride (PROSCAR) 5 MG tablet, Take 1 tablet (5 mg total) by mouth at bedtime., Disp: 14 tablet, Rfl: 0 .  glipiZIDE (GLUCOTROL) 5 MG tablet, Take 0.5 tablets (2.5 mg total) by mouth 2 (two) times daily before a meal. (Patient taking differently: Take 2.5 mg by mouth daily before breakfast. ), Disp: 180 tablet, Rfl: 0 .  metFORMIN (GLUCOPHAGE) 500 MG tablet, Take 1 tablet (500 mg total) by mouth 2 (two) times daily with a meal., Disp: 180 tablet, Rfl: 0 .  rivaroxaban (XARELTO) 20 MG TABS tablet, Take 1 tablet (20 mg total) by mouth daily with supper., Disp: 30 tablet, Rfl: 11 .  tamsulosin (FLOMAX) 0.4 MG CAPS capsule, Take 0.4 mg by mouth every morning. , Disp: , Rfl:  .  fluticasone (FLONASE) 50 MCG/ACT nasal spray, Place 2 sprays into both nostrils daily., Disp: 16 g, Rfl: 1 Medications Discontinued During This Encounter  Medication Reason  . atorvastatin (LIPITOR) 40 MG tablet Reorder  . metFORMIN (GLUCOPHAGE) 500 MG tablet     I have personally reviewed and addressed the Medicare Annual Wellness health risk  assessment questionnaire and have noted the following in the patient's chart:  A.         Medical and social history & family history B.         Use of alcohol, tobacco or illicit drugs  C.         Current medications and supplements D.         Functional and Cognitive ability and status E.         Nutritional status F.         Physical activity G.        Advance directives H.         List of other physicians I.          Hospitalizations, surgeries, and ER visits in previous 12 months J.         [place] such as hearing and vision if needed, cognitive and depression L.         Referrals and appointments - Urologist   In addition, I have reviewed and discussed with patient certain preventive protocols, quality metrics, and best practice recommendations. A written personalized care plan for preventive services as well as general preventive health recommendations were provided to patient.   See attached scanned questionnaire for additional information.   Return in about 1 month (around 09/22/2018) for Follow up DM, Hyperlipidemia.  4 month follow up

## 2018-08-23 LAB — LIPID PANEL
CHOL/HDL RATIO: 3 (calc) (ref <5.0)
CHOLESTEROL: 142 mg/dL (ref <200)
HDL: 47 mg/dL (ref >40)
LDL Cholesterol (Calc): 80 mg/dL (calc)
Non-HDL Cholesterol (Calc): 95 mg/dL (calc) (ref <130)
TRIGLYCERIDES: 73 mg/dL (ref <150)

## 2018-08-23 LAB — COMPLETE METABOLIC PANEL WITH GFR
AG Ratio: 1.2 (calc) (ref 1.0–2.5)
ALT: 47 U/L — ABNORMAL HIGH (ref 9–46)
AST: 24 U/L (ref 10–35)
Albumin: 3.7 g/dL (ref 3.6–5.1)
Alkaline phosphatase (APISO): 101 U/L (ref 40–115)
BILIRUBIN TOTAL: 0.5 mg/dL (ref 0.2–1.2)
BUN/Creatinine Ratio: 10 (calc) (ref 6–22)
BUN: 13 mg/dL (ref 7–25)
CHLORIDE: 106 mmol/L (ref 98–110)
CO2: 24 mmol/L (ref 20–32)
Calcium: 9.3 mg/dL (ref 8.6–10.3)
Creat: 1.32 mg/dL — ABNORMAL HIGH (ref 0.70–1.25)
GFR, Est African American: 65 mL/min/{1.73_m2} (ref >=60)
GFR, Est Non African American: 56 mL/min/{1.73_m2} — ABNORMAL LOW (ref >=60)
Globulin: 3.1 g/dL (calc) (ref 1.9–3.7)
Glucose, Bld: 165 mg/dL — ABNORMAL HIGH (ref 65–99)
Potassium: 4.9 mmol/L (ref 3.5–5.3)
Sodium: 137 mmol/L (ref 135–146)
Total Protein: 6.8 g/dL (ref 6.1–8.1)

## 2018-08-23 LAB — HEMOGLOBIN A1C
Hgb A1c MFr Bld: 8.1 % of total Hgb — ABNORMAL HIGH (ref <5.7)
Mean Plasma Glucose: 186 (calc)
eAG (mmol/L): 10.3 (calc)

## 2018-08-23 LAB — PSA: PSA: 7.3 ng/mL — ABNORMAL HIGH (ref <=4.0)

## 2018-08-23 LAB — VITAMIN D 25 HYDROXY (VIT D DEFICIENCY, FRACTURES): Vit D, 25-Hydroxy: 17 ng/mL — ABNORMAL LOW (ref 30–100)

## 2018-08-24 ENCOUNTER — Encounter: Payer: Self-pay | Admitting: Family Medicine

## 2018-09-14 ENCOUNTER — Other Ambulatory Visit: Payer: Self-pay

## 2018-09-14 ENCOUNTER — Ambulatory Visit
Admission: RE | Admit: 2018-09-14 | Discharge: 2018-09-14 | Disposition: A | Discharge status: Home / Self Care | Payer: Medicare Other | Source: Ambulatory Visit | Attending: Internal Medicine | Admitting: Internal Medicine

## 2018-09-14 DIAGNOSIS — C3412 Malignant neoplasm of upper lobe, left bronchus or lung: Secondary | ICD-10-CM | POA: Diagnosis not present

## 2018-09-14 DIAGNOSIS — C3490 Malignant neoplasm of unspecified part of unspecified bronchus or lung: Secondary | ICD-10-CM | POA: Diagnosis not present

## 2018-09-19 ENCOUNTER — Inpatient Hospital Stay: Payer: Medicare Other | Attending: Internal Medicine

## 2018-09-19 ENCOUNTER — Inpatient Hospital Stay (HOSPITAL_BASED_OUTPATIENT_CLINIC_OR_DEPARTMENT_OTHER): Payer: Medicare Other | Admitting: Internal Medicine

## 2018-09-19 VITALS — BP 167/95 | HR 84 | Temp 97.9°F | Resp 16 | Wt 171.0 lb

## 2018-09-19 DIAGNOSIS — C3412 Malignant neoplasm of upper lobe, left bronchus or lung: Secondary | ICD-10-CM | POA: Insufficient documentation

## 2018-09-19 DIAGNOSIS — Z79899 Other long term (current) drug therapy: Secondary | ICD-10-CM | POA: Diagnosis not present

## 2018-09-19 DIAGNOSIS — Z7984 Long term (current) use of oral hypoglycemic drugs: Secondary | ICD-10-CM

## 2018-09-19 DIAGNOSIS — Z86718 Personal history of other venous thrombosis and embolism: Secondary | ICD-10-CM | POA: Insufficient documentation

## 2018-09-19 DIAGNOSIS — Z8673 Personal history of transient ischemic attack (TIA), and cerebral infarction without residual deficits: Secondary | ICD-10-CM

## 2018-09-19 DIAGNOSIS — Z8249 Family history of ischemic heart disease and other diseases of the circulatory system: Secondary | ICD-10-CM

## 2018-09-19 DIAGNOSIS — Z7901 Long term (current) use of anticoagulants: Secondary | ICD-10-CM

## 2018-09-19 DIAGNOSIS — N183 Chronic kidney disease, stage 3 (moderate): Secondary | ICD-10-CM | POA: Insufficient documentation

## 2018-09-19 DIAGNOSIS — Z86711 Personal history of pulmonary embolism: Secondary | ICD-10-CM | POA: Insufficient documentation

## 2018-09-19 DIAGNOSIS — E1122 Type 2 diabetes mellitus with diabetic chronic kidney disease: Secondary | ICD-10-CM | POA: Insufficient documentation

## 2018-09-19 DIAGNOSIS — I129 Hypertensive chronic kidney disease with stage 1 through stage 4 chronic kidney disease, or unspecified chronic kidney disease: Secondary | ICD-10-CM | POA: Diagnosis not present

## 2018-09-19 LAB — COMPREHENSIVE METABOLIC PANEL
ALT: 47 U/L — ABNORMAL HIGH (ref 0–44)
AST: 28 U/L (ref 15–41)
Albumin: 3.6 g/dL (ref 3.5–5.0)
Alkaline Phosphatase: 98 U/L (ref 38–126)
Anion gap: 7 (ref 5–15)
BUN: 16 mg/dL (ref 8–23)
CHLORIDE: 105 mmol/L (ref 98–111)
CO2: 25 mmol/L (ref 22–32)
Calcium: 9 mg/dL (ref 8.9–10.3)
Creatinine, Ser: 1.45 mg/dL — ABNORMAL HIGH (ref 0.61–1.24)
GFR calc Af Amer: 58 mL/min — ABNORMAL LOW (ref >60)
GFR, EST NON AFRICAN AMERICAN: 50 mL/min — AB (ref >60)
Glucose, Bld: 152 mg/dL — ABNORMAL HIGH (ref 70–99)
Potassium: 4.3 mmol/L (ref 3.5–5.1)
Sodium: 137 mmol/L (ref 135–145)
Total Bilirubin: 1.1 mg/dL (ref 0.3–1.2)
Total Protein: 7.2 g/dL (ref 6.5–8.1)

## 2018-09-19 LAB — CBC WITH DIFFERENTIAL/PLATELET
Abs Immature Granulocytes: 0.02 10*3/uL (ref 0.00–0.07)
Basophils Absolute: 0 10*3/uL (ref 0.0–0.1)
Basophils Relative: 1 %
Eosinophils Absolute: 0.2 10*3/uL (ref 0.0–0.5)
Eosinophils Relative: 3 %
HCT: 41.2 % (ref 39.0–52.0)
HEMOGLOBIN: 13.4 g/dL (ref 13.0–17.0)
Immature Granulocytes: 0 %
LYMPHS PCT: 38 %
Lymphs Abs: 2.1 10*3/uL (ref 0.7–4.0)
MCH: 27.6 pg (ref 26.0–34.0)
MCHC: 32.5 g/dL (ref 30.0–36.0)
MCV: 84.8 fL (ref 80.0–100.0)
Monocytes Absolute: 0.9 10*3/uL (ref 0.1–1.0)
Monocytes Relative: 17 %
Neutro Abs: 2.2 10*3/uL (ref 1.7–7.7)
Neutrophils Relative %: 41 %
Platelets: 292 10*3/uL (ref 150–400)
RBC: 4.86 MIL/uL (ref 4.22–5.81)
RDW: 15.7 % — ABNORMAL HIGH (ref 11.5–15.5)
WBC: 5.4 10*3/uL (ref 4.0–10.5)
nRBC: 0 % (ref 0.0–0.2)

## 2018-09-19 NOTE — Assessment & Plan Note (Addendum)
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   # Continue crizotinib 250 mg twice a day; no clinical evidence of progression.  Tolerating well.    # CKD- multifactorial [prostatitic obstruction/diabetes]-creatinine 1.4; stable.   # Left arm pain/ecchymoses- sec to xarelto- HOLD xarelto for 1 week; and then re-start. Ice packs.   # Bil LE swelling: CKD/crizotinib compression stockings stable  # Bilateral PE & left lower extremity DVT:  on Xarelto; stable. Hold xarelto x1 week; as above.   # HTN-poorly controlled.  Check at home; bring log at next visit.   # DISPOSITION: # follow up in 4 weeks; labs-cbc/bmp-Dr.B 

## 2018-09-19 NOTE — Patient Instructions (Addendum)
#  Hold Xarelto for 1 week; then start taking Xarelto on a daily basis. #Use ice packs for left arm swelling/discomfort; tyelenol every 8 hours as needed #Please check your blood pressure once a day at home/bring the log at the next visit.

## 2018-09-19 NOTE — Progress Notes (Signed)
[facility] OFFICE PROGRESS NOTE  Patient Care Team: [name], [name], MD as Medical Oncologist (Medical Oncology)  Cancer Staging No matching staging information was found for the patient.   Oncology History   # OCT 2017- ADENO CA LUNG; STAGE IV [; LUL; bil supraclavicular LN; Left neck LN Bx]; ROS-1 MUTATED; s/p Carbo-alimta x1[oct 2017]  # NOV 1st 2017- XALKORI 250 mg BID; JAN 15th CT- PR;  # AUG 27th Chemo-RT to persistent LUL/mediastinal LN [s/p carbo-taxol- with RT; finished Sep 22nd 2018]  # LLE DVT/bil PE/Multiple strokes [? On xarelto]-Lovenox; Jan mid 2018- xarelto [lovenox-insurance issues]  # MRI brain- multiple infarcts [2d echo/bubble study-NEG's/p Neurology eval]  ------------------------------------------------------------    # # s/p TURP [Sep 2017; Dr.Cope] DEC 26th CT- distended bladder   # MOLECULAR TESTING- ROS-1 POSITIVE; ALK/EGFR-NEG; PDL-1 EXPRESSION- 90%** [HIGH] -----------------------------------------------------------------    DIAGNOSIS: Adenocarcinoma the lung  ROS-1 +  STAGE:  IV       ;GOALS: Palliative  CURRENT/MOST RECENT THERAPY- CRIZOTINIB     Primary cancer of left upper lobe of lung ([place])      INTERVAL HISTORY:  Melvin Willis 68 y.o.  male pleasant patient above history of metastatic lung cancer-Ros: Positive currently on crizotinib is here for follow-up/review there is a CAT scan.  Patient states approximately a week ago he noted to a big bruise on his left upper extremity.  States that he was lifting a bag of water; heard a pop in his bicep muscles/and then noted to have a bruise over the left upper extremity.  He did has any tingling or numbness.  Denies any worsening shortness of breath or cough.  Appetite is good.  No weight loss but no headaches.  No nausea vomiting.  Review of Systems  Constitutional: Negative for chills, diaphoresis, fever,  malaise/fatigue and weight loss.  HENT: Negative for nosebleeds and sore throat.   Eyes: Negative for double vision.  Respiratory: Negative for cough, hemoptysis, sputum production, shortness of breath and wheezing.   Cardiovascular: Negative for chest pain, palpitations and orthopnea.  Gastrointestinal: Negative for abdominal pain, blood in stool, constipation, diarrhea, heartburn, melena, nausea and vomiting.  Genitourinary: Negative for dysuria, frequency and urgency.  Musculoskeletal: Negative for back pain and joint pain.       Left upper extremity discomfort/bruising.  Skin: Negative.  Negative for itching and rash.  Neurological: Negative for dizziness, tingling, focal weakness, weakness and headaches.  Endo/Heme/Allergies: Does not bruise/bleed easily.  Psychiatric/Behavioral: Negative for depression. The patient is not nervous/anxious and does not have insomnia.       PAST MEDICAL HISTORY :  Past Medical History:  Diagnosis Date  . Abnormal prostate specific antigen 08/08/2012  . Adiposity 04/16/2015  . Chronic kidney disease (CKD), stage III (moderate) ([place]) 11/27/2016  . CVA (cerebral vascular accident) ([place]) 06/17/2016  . Diabetes mellitus without complication ([place])   . Diverticulosis of sigmoid colon 04/16/2015  . Dyslipidemia 03/18/2015  . Hemorrhoids, internal 04/16/2015  . Hypercholesteremia 04/16/2015  . Hyperlipidemia   . Hypertension   . Primary cancer of left upper lobe of lung ([place])   . Pulmonary embolism ([place])   . Wears dentures    partial upper    PAST SURGICAL HISTORY :   Past Surgical History:  Procedure Laterality Date  . COLONOSCOPY    . COLONOSCOPY WITH PROPOFOL N/A 05/06/2015   Procedure: COLONOSCOPY WITH PROPOFOL;  Surgeon: Lucilla Lame, MD;  Location[facility];  Service: Endoscopy;  Laterality: N/A;  ASCENDING COLON POLYPS X 2 TERMINAL ILEUM BIOPSY RANDOM COLON BX. TRANSVERSE COLON POLYP SIGMOID COLON POLYP  . ESOPHAGOGASTRODUODENOSCOPY (EGD)  WITH PROPOFOL N/A 05/06/2015   Procedure: ESOPHAGOGASTRODUODENOSCOPY (EGD) WITH PROPOFOL;  Surgeon: Lucilla Lame, MD;  Location: [facility];  Service: Endoscopy;  Laterality: N/A;  GASTRIC BIOPSY X1  . KIDNEY STONE SURGERY  2017    FAMILY HISTORY :   Family History  Problem Relation Age of Onset  . Diabetes Mother   . Diabetes Father   . CAD Father   . Dementia Father   . Diabetes Sister   . Cancer Maternal Uncle        Prostate  . Cancer Cousin        prostate    SOCIAL HISTORY:   Social History   Tobacco Use  . Smoking status: Never Smoker  . Smokeless tobacco: Never Used  Substance Use Topics  . Alcohol use: No    Alcohol/week: 0.0 standard drinks  . Drug use: No    ALLERGIES:  has No Known Allergies.  MEDICATIONS:  Current Outpatient Medications  Medication Sig Dispense Refill  . atorvastatin (LIPITOR) 40 MG tablet Take 1 tablet (40 mg total) by mouth 3 (three) times a week. 48 tablet 1  . crizotinib (XALKORI) 250 MG capsule Take 1 capsule (250 mg total) by mouth 2 (two) times daily. 60 capsule 3  . finasteride (PROSCAR) 5 MG tablet Take 1 tablet (5 mg total) by mouth at bedtime. 14 tablet 0  . fluticasone (FLONASE) 50 MCG/ACT nasal spray Place 2 sprays into both nostrils daily. 16 g 1  . glipiZIDE (GLUCOTROL) 5 MG tablet Take 0.5 tablets (2.5 mg total) by mouth 2 (two) times daily before a meal. (Patient taking differently: Take 2.5 mg by mouth daily before breakfast. ) 180 tablet 0  . metFORMIN (GLUCOPHAGE) 500 MG tablet Take 1 tablet (500 mg total) by mouth 2 (two) times daily with a meal. 180 tablet 0  . rivaroxaban (XARELTO) 20 MG TABS tablet Take 1 tablet (20 mg total) by mouth daily with supper. 30 tablet 11  . tamsulosin (FLOMAX) 0.4 MG CAPS capsule Take 0.4 mg by mouth every morning.      No current facility-administered medications for this visit.     PHYSICAL EXAMINATION: ECOG PERFORMANCE STATUS: 0 - Asymptomatic  BP (!) 167/95 (BP Location:  Left Arm, Patient Position: Sitting, Cuff Size: Normal)   Pulse 84   Temp 97.9 F (36.6 C) (Tympanic)   Resp 16   Wt 171 lb (77.6 kg)   BMI 29.35 kg/m   Filed Weights   09/19/18 1056  Weight: 171 lb (77.6 kg)    Physical Exam  Constitutional: He is oriented to person, place, and time and well-developed, well-nourished, and in no distress.  Alone. He is walking by himself.  HENT:  Head: Normocephalic and atraumatic.  Mouth/Throat: Oropharynx is clear and moist. No oropharyngeal exudate.  Eyes: Pupils are equal, round, and reactive to light.  Neck: Normal range of motion. Neck supple.  Cardiovascular: Normal rate and regular rhythm.  Pulmonary/Chest: No respiratory distress. He has no wheezes.  Abdominal: Soft. Bowel sounds are normal. He exhibits no distension and no mass. There is no abdominal tenderness. There is no rebound and no guarding.  Musculoskeletal: Normal range of motion.        General: No tenderness or edema.  Neurological: He is alert and oriented to person, place, and time.  Skin: Skin is warm.  Big bruise noted on th left upper extremity/arm and forearm.  Psychiatric: Affect normal.       LABORATORY DATA:  I have reviewed the data as listed    Component Value Date/Time   NA 137 09/19/2018 1027   NA 139 01/01/2016 1000   K 4.3 09/19/2018 1027   CL 105 09/19/2018 1027   CO2 25 09/19/2018 1027   GLUCOSE 152 (H) 09/19/2018 1027   BUN 16 09/19/2018 1027   BUN 13 01/01/2016 1000   CREATININE 1.45 (H) 09/19/2018 1027   CREATININE 1.32 (H) 08/22/2018 1107   CALCIUM 9.0 09/19/2018 1027   PROT 7.2 09/19/2018 1027   PROT 7.2 01/01/2016 1000   ALBUMIN 3.6 09/19/2018 1027   ALBUMIN 4.4 01/01/2016 1000   AST 28 09/19/2018 1027   ALT 47 (H) 09/19/2018 1027   ALKPHOS 98 09/19/2018 1027   BILITOT 1.1 09/19/2018 1027   BILITOT 0.4 01/01/2016 1000   GFRNONAA 50 (L) 09/19/2018 1027   GFRNONAA 56 (L) 08/22/2018 1107   GFRAA 58 (L) 09/19/2018 1027   GFRAA 65  08/22/2018 1107    No results found for: SPEP, UPEP  Lab Results  Component Value Date   WBC 5.4 09/19/2018   NEUTROABS 2.2 09/19/2018   HGB 13.4 09/19/2018   HCT 41.2 09/19/2018   MCV 84.8 09/19/2018   PLT 292 09/19/2018      Chemistry      Component Value Date/Time   NA 137 09/19/2018 1027   NA 139 01/01/2016 1000   K 4.3 09/19/2018 1027   CL 105 09/19/2018 1027   CO2 25 09/19/2018 1027   BUN 16 09/19/2018 1027   BUN 13 01/01/2016 1000   CREATININE 1.45 (H) 09/19/2018 1027   CREATININE 1.32 (H) 08/22/2018 1107      Component Value Date/Time   CALCIUM 9.0 09/19/2018 1027   ALKPHOS 98 09/19/2018 1027   AST 28 09/19/2018 1027   ALT 47 (H) 09/19/2018 1027   BILITOT 1.1 09/19/2018 1027   BILITOT 0.4 01/01/2016 1000       RADIOGRAPHIC STUDIES: I have personally reviewed the radiological images as listed and agreed with the findings in the report. No results found.   ASSESSMENT & PLAN:  Primary cancer of left upper lobe of lung [facility]) # Adenocarcinoma of the lung metastatic/stage IV-ROS-1 positive: CT scan Jan 8th 2019- improved aeration; No evidence of metastatic disease.   # Continue crizotinib 250 mg twice a day; no clinical evidence of progression.  Tolerating well.    # CKD- multifactorial [prostatitic obstruction/diabetes]-creatinine 1.4; stable.   # Left arm pain/ecchymoses- sec to xarelto- HOLD xarelto for 1 week; and then re-start. Ice packs.   # Bil LE swelling: CKD/crizotinib compression stockings stable  # Bilateral PE & left lower extremity DVT:  on Xarelto; stable. Hold xarelto x1 week; as above.   # HTN-poorly controlled.  Check at home; bring log at next visit.   # DISPOSITION: # follow up in 4 weeks; labs-cbc/bmp-Dr.B   Orders Placed This Encounter  Procedures  . CBC with Differential    Standing Status:   Future    Standing Expiration Date:   09/20/2019  . Basic metabolic panel    Standing Status:   Future    Standing Expiration Date:    09/20/2019   All questions were answered. The patient knows to call the clinic with any problems, questions or concerns.      Cammie Sickle, MD 09/19/2018  1:24 PM

## 2018-09-28 ENCOUNTER — Ambulatory Visit (INDEPENDENT_AMBULATORY_CARE_PROVIDER_SITE_OTHER): Payer: Medicare Other | Admitting: Urology

## 2018-09-28 ENCOUNTER — Encounter: Payer: Self-pay | Admitting: Urology

## 2018-09-28 VITALS — BP 170/90 | HR 80 | Ht 64.0 in | Wt 172.0 lb

## 2018-09-28 DIAGNOSIS — N401 Enlarged prostate with lower urinary tract symptoms: Secondary | ICD-10-CM | POA: Diagnosis not present

## 2018-09-28 DIAGNOSIS — N403 Nodular prostate with lower urinary tract symptoms: Secondary | ICD-10-CM | POA: Diagnosis not present

## 2018-09-28 DIAGNOSIS — N138 Other obstructive and reflux uropathy: Secondary | ICD-10-CM

## 2018-09-28 DIAGNOSIS — R972 Elevated prostate specific antigen [PSA]: Secondary | ICD-10-CM

## 2018-09-28 LAB — URINALYSIS, COMPLETE
Bilirubin, UA: NEGATIVE
Glucose, UA: NEGATIVE
Ketones, UA: NEGATIVE
Leukocytes, UA: NEGATIVE
Nitrite, UA: NEGATIVE
Protein, UA: NEGATIVE
RBC, UA: NEGATIVE
Specific Gravity, UA: 1.02 (ref 1.005–1.030)
Urobilinogen, Ur: 0.2 mg/dL (ref 0.2–1.0)
pH, UA: 5 (ref 5.0–7.5)

## 2018-09-28 LAB — BLADDER SCAN AMB NON-IMAGING

## 2018-09-29 ENCOUNTER — Telehealth: Payer: Self-pay | Admitting: Urology

## 2018-09-29 NOTE — Progress Notes (Signed)
09/28/2018 6:53 AM   Melvin Willis [birth year] [id number]  Referring provider: [name], MD [address] [place], [place] [address]  Chief Complaint  Patient presents with  . Nodular prostate with urinary obstruction   Urologic history: 1.  BPH with lower urinary tract symptoms  -Combination therapy tamsulosin/finasteride  2.  Elevated PSA  -Previous biopsy Dr. [name] before 2014.  Records not available  3.  History of bladder calculus  -Cystolitholapaxy August 467   HPI: 68 year old male with a long history of BPH and an elevated PSA.  He has been followed by Dr. [name] for several years and last saw him at [facility] in December 2018.  He has an elevated PSA and on available record review was 4.5 in July 2014.  His PSA remains in the low 4 range however in July 2017 was 8.14.  His most recent PSAs:  03/2016  8.14 02/2016  7.22 08/2017 8.20 08/2018 7.3 These are all uncorrected PSA values.  There is no mention of a previous prostate biopsy and review of Dr. [name] notes in [place].  The patient states he had a biopsy at Dr. [name] previous [facility] practice which was negative.  He does not remember his PSA level at the time of biopsy.  He has been on tamsulosin and finasteride since at least 2014.  He has stable lower urinary tract symptoms.  He ran out of tamsulosin 3 to 4 weeks ago and has noted slight worsening of his voiding symptoms with his most bothersome symptoms being a weak urinary stream and urinary hesitancy.  IPSS completed today was 8/2.  Denies dysuria, gross hematuria or flank/abdominal/pelvic/scrotal pain.   PMH: Past Medical History:  Diagnosis Date  . Abnormal prostate specific antigen 08/08/2012  . Adiposity 04/16/2015  . Chronic kidney disease (CKD), stage III (moderate) ([place]) 11/27/2016  . CVA (cerebral vascular accident) ([place]) 06/17/2016  . Diabetes mellitus without complication ([place])   . Diverticulosis of sigmoid colon  04/16/2015  . Dyslipidemia 03/18/2015  . Hemorrhoids, internal 04/16/2015  . Hypercholesteremia 04/16/2015  . Hyperlipidemia   . Hypertension   . Primary cancer of left upper lobe of lung ([place])   . Pulmonary embolism ([place])   . Wears dentures    partial upper    Surgical History: Past Surgical History:  Procedure Laterality Date  . COLONOSCOPY    . COLONOSCOPY WITH PROPOFOL N/A 05/06/2015   Procedure: COLONOSCOPY WITH PROPOFOL;  Surgeon: Lucilla Lame, MD;  Location: [facility];  Service: Endoscopy;  Laterality: N/A;  ASCENDING COLON POLYPS X 2 TERMINAL ILEUM BIOPSY RANDOM COLON BX. TRANSVERSE COLON POLYP SIGMOID COLON POLYP  . ESOPHAGOGASTRODUODENOSCOPY (EGD) WITH PROPOFOL N/A 05/06/2015   Procedure: ESOPHAGOGASTRODUODENOSCOPY (EGD) WITH PROPOFOL;  Surgeon: Lucilla Lame, MD;  Location: [facility];  Service: Endoscopy;  Laterality: N/A;  GASTRIC BIOPSY X1  . KIDNEY STONE SURGERY  2017    Home Medications:  Allergies as of 09/28/2018   No Known Allergies     Medication List       Accurate as of September 28, 2018 11:59 PM. Always use your most recent med list.        atorvastatin 40 MG tablet Commonly known as:  LIPITOR Take 1 tablet (40 mg total) by mouth 3 (three) times a week.   crizotinib 250 MG capsule Commonly known as:  XALKORI Take 1 capsule (250 mg total) by mouth 2 (two) times daily.   finasteride 5 MG tablet Commonly known as:  PROSCAR  Take 1 tablet (5 mg total) by mouth at bedtime.   fluticasone 50 MCG/ACT nasal spray Commonly known as:  FLONASE Place 2 sprays into both nostrils daily.   glipiZIDE 5 MG tablet Commonly known as:  GLUCOTROL Take 0.5 tablets (2.5 mg total) by mouth 2 (two) times daily before a meal.   metFORMIN 500 MG tablet Commonly known as:  GLUCOPHAGE Take 1 tablet (500 mg total) by mouth 2 (two) times daily with a meal.   rivaroxaban 20 MG Tabs tablet Commonly known as:  XARELTO Take 1 tablet (20 mg total) by mouth daily  with supper.   tamsulosin 0.4 MG Caps capsule Commonly known as:  FLOMAX Take 0.4 mg by mouth every morning.       Allergies: No Known Allergies  Family History: Family History  Problem Relation Age of Onset  . Diabetes Mother   . Diabetes Father   . CAD Father   . Dementia Father   . Diabetes Sister   . Cancer Maternal Uncle        Prostate  . Cancer Cousin        prostate    Social History:  reports that he has never smoked. He has never used smokeless tobacco. He reports that he does not drink alcohol or use drugs.  ROS: UROLOGY Frequent Urination?: No Hard to postpone urination?: No Burning/pain with urination?: No Get up at night to urinate?: No Leakage of urine?: Yes Urine stream starts and stops?: No Trouble starting stream?: No Do you have to strain to urinate?: No Blood in urine?: No Urinary tract infection?: No Sexually transmitted disease?: No Injury to kidneys or bladder?: No Painful intercourse?: No Weak stream?: Yes Erection problems?: Yes Penile pain?: No  Gastrointestinal Nausea?: No Vomiting?: No Indigestion/heartburn?: No Diarrhea?: No Constipation?: No  Constitutional Fever: No Night sweats?: No Weight loss?: No Fatigue?: No  Skin Skin rash/lesions?: No Itching?: No  Eyes Blurred vision?: Yes Double vision?: No  Ears/Nose/Throat Sore throat?: No Sinus problems?: No  Hematologic/Lymphatic Swollen glands?: No Easy bruising?: No  Cardiovascular Leg swelling?: No Chest pain?: No  Respiratory Cough?: No Shortness of breath?: No  Endocrine Excessive thirst?: No  Musculoskeletal Back pain?: No Joint pain?: No  Neurological Headaches?: No Dizziness?: No  Psychologic Depression?: No Anxiety?: No  Physical Exam: BP (!) 170/90   Pulse 80   Ht 5\' 4"  (1.626 m)   Wt 172 lb (78 kg)   BMI 29.52 kg/m   Constitutional:  Alert and oriented, No acute distress. HEENT: [place] AT, moist mucus membranes.  Trachea  midline, no masses. Cardiovascular: No clubbing, cyanosis, or edema. Respiratory: Normal respiratory effort, no increased work of breathing. GI: Abdomen is soft, nontender, nondistended, no abdominal masses GU: No CVA tenderness.  Prostate 50 g, smooth without nodules. Lymph: No cervical or inguinal lymphadenopathy. Skin: No rashes, bruises or suspicious lesions. Neurologic: Grossly intact, no focal deficits, moving all 4 extremities. Psychiatric: Normal mood and affect.  Laboratory Data:  Lab Results  Component Value Date   PSA 7.3 (H) 08/22/2018   PSA 7.63 (H) 06/05/2016    Lab Results  Component Value Date   TESTOSTERONE 214 (L) 10/28/2016    Lab Results  Component Value Date   HGBA1C 8.1 (H) 08/22/2018     Assessment & Plan:   68 year old male with BPH and an elevated PSA.  Voiding pattern is stable on combination therapy.  Tamsulosin and finasteride were refilled.  PVR by bladder scan today was 83 mL.  Recent uncorrected PSA December 2019 at his PCP was 7.3.  He has had a previous negative biopsy but this was more than 6 years ago and he does not remember the PSA level.  I recommended scheduling a prostate MRI for further evaluation.  He will be notified with results and further recommendations.   Abbie Sons, [place] [address] [place], [place] [address] [phone]

## 2018-09-29 NOTE — Telephone Encounter (Signed)
Patient was seen yesterday and was supposed to have had refills for Tamsulosin and finasteride but he said they were not sent to his pharmacy?    [name]

## 2018-09-30 ENCOUNTER — Telehealth: Payer: Self-pay

## 2018-09-30 ENCOUNTER — Encounter: Payer: Self-pay | Admitting: Urology

## 2018-09-30 MED ORDER — TAMSULOSIN HCL 0.4 MG PO CAPS: 0.4000 mg | ORAL_CAPSULE | ORAL | 3 refills | Status: DC

## 2018-09-30 MED ORDER — FINASTERIDE 5 MG PO TABS: 5.0000 mg | ORAL_TABLET | Freq: Every day | ORAL | 3 refills | Status: DC

## 2018-09-30 NOTE — Telephone Encounter (Signed)
-----   Message from Abbie Sons, MD sent at 09/29/2018  7:19 AM EST ----- Regarding: Elevated PSA Patient was seen yesterday for BPH.  He was a former [facility] patient and has an elevated PSA dating back to 2014.  Of course [facility]'s notes he never mentioned if he had a prostate biopsy.  I did not have this information when I saw the patient.  Please ask patient if he has ever had a prostate biopsy and if so what year.  Thanks

## 2018-09-30 NOTE — Telephone Encounter (Signed)
Prescriptions were sent

## 2018-09-30 NOTE — Telephone Encounter (Signed)
Pt called back and still does not have his refill requests.

## 2018-09-30 NOTE — Telephone Encounter (Signed)
Called pt he states that he has had a BX but cannot remember what year.

## 2018-10-17 ENCOUNTER — Inpatient Hospital Stay: Payer: Medicare Other | Attending: Internal Medicine

## 2018-10-17 ENCOUNTER — Encounter: Payer: Self-pay | Admitting: Internal Medicine

## 2018-10-17 ENCOUNTER — Inpatient Hospital Stay (HOSPITAL_BASED_OUTPATIENT_CLINIC_OR_DEPARTMENT_OTHER): Payer: Medicare Other | Admitting: Internal Medicine

## 2018-10-17 VITALS — BP 154/76 | HR 61 | Temp 97.8°F | Resp 16 | Wt 169.0 lb

## 2018-10-17 DIAGNOSIS — Z86718 Personal history of other venous thrombosis and embolism: Secondary | ICD-10-CM

## 2018-10-17 DIAGNOSIS — Z86711 Personal history of pulmonary embolism: Secondary | ICD-10-CM

## 2018-10-17 DIAGNOSIS — Z8673 Personal history of transient ischemic attack (TIA), and cerebral infarction without residual deficits: Secondary | ICD-10-CM

## 2018-10-17 DIAGNOSIS — Z7901 Long term (current) use of anticoagulants: Secondary | ICD-10-CM | POA: Insufficient documentation

## 2018-10-17 DIAGNOSIS — I129 Hypertensive chronic kidney disease with stage 1 through stage 4 chronic kidney disease, or unspecified chronic kidney disease: Secondary | ICD-10-CM | POA: Insufficient documentation

## 2018-10-17 DIAGNOSIS — N183 Chronic kidney disease, stage 3 (moderate): Secondary | ICD-10-CM | POA: Diagnosis not present

## 2018-10-17 DIAGNOSIS — E1122 Type 2 diabetes mellitus with diabetic chronic kidney disease: Secondary | ICD-10-CM | POA: Insufficient documentation

## 2018-10-17 DIAGNOSIS — Z8249 Family history of ischemic heart disease and other diseases of the circulatory system: Secondary | ICD-10-CM | POA: Insufficient documentation

## 2018-10-17 DIAGNOSIS — Z79899 Other long term (current) drug therapy: Secondary | ICD-10-CM

## 2018-10-17 DIAGNOSIS — C3412 Malignant neoplasm of upper lobe, left bronchus or lung: Secondary | ICD-10-CM

## 2018-10-17 DIAGNOSIS — Z7984 Long term (current) use of oral hypoglycemic drugs: Secondary | ICD-10-CM

## 2018-10-17 LAB — CBC WITH DIFFERENTIAL/PLATELET
Abs Immature Granulocytes: 0.01 10*3/uL (ref 0.00–0.07)
Basophils Absolute: 0 10*3/uL (ref 0.0–0.1)
Basophils Relative: 1 %
EOS PCT: 4 %
Eosinophils Absolute: 0.2 10*3/uL (ref 0.0–0.5)
HCT: 41.5 % (ref 39.0–52.0)
Hemoglobin: 13.6 g/dL (ref 13.0–17.0)
Immature Granulocytes: 0 %
Lymphocytes Relative: 47 %
Lymphs Abs: 1.8 10*3/uL (ref 0.7–4.0)
MCH: 27.6 pg (ref 26.0–34.0)
MCHC: 32.8 g/dL (ref 30.0–36.0)
MCV: 84.2 fL (ref 80.0–100.0)
Monocytes Absolute: 0.6 10*3/uL (ref 0.1–1.0)
Monocytes Relative: 15 %
Neutro Abs: 1.3 10*3/uL — ABNORMAL LOW (ref 1.7–7.7)
Neutrophils Relative %: 33 %
Platelets: 234 10*3/uL (ref 150–400)
RBC: 4.93 MIL/uL (ref 4.22–5.81)
RDW: 15.1 % (ref 11.5–15.5)
WBC: 3.8 10*3/uL — ABNORMAL LOW (ref 4.0–10.5)
nRBC: 0 % (ref 0.0–0.2)

## 2018-10-17 LAB — BASIC METABOLIC PANEL
Anion gap: 6 (ref 5–15)
BUN: 16 mg/dL (ref 8–23)
CO2: 23 mmol/L (ref 22–32)
CREATININE: 1.28 mg/dL — AB (ref 0.61–1.24)
Calcium: 8.2 mg/dL — ABNORMAL LOW (ref 8.9–10.3)
Chloride: 108 mmol/L (ref 98–111)
GFR calc Af Amer: >60 mL/min (ref >60)
GFR calc non Af Amer: 58 mL/min — ABNORMAL LOW (ref >60)
Glucose, Bld: 155 mg/dL — ABNORMAL HIGH (ref 70–99)
Potassium: 3.7 mmol/L (ref 3.5–5.1)
Sodium: 137 mmol/L (ref 135–145)

## 2018-10-17 NOTE — Assessment & Plan Note (Addendum)
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 Stable.   # Continue crizotinib 250 mg twice a day; no clinical evidence of progression.  Stable.   # CKD- multifactorial [prostatitic obstruction/diabetes]- creatinine 1.28 stable. Reviewed urology findings/await MRI the pelvis.  # Left arm pain/ecchymoses- sec to xarelto- resolved.   # Bil LE swelling: CKD/crizotinib compression stockings; stable.   # Bilateral PE & left lower extremity DVT:  on Xarelto; stable.  # HTN-better controlled; 150s.   # DISPOSITION: # follow up in 4 weeks; labs-cbc/bmp-Dr.B

## 2018-10-17 NOTE — Progress Notes (Signed)
[facility] OFFICE PROGRESS NOTE  Patient Care Team: [name], [name], MD as Medical Oncologist (Medical Oncology)  Cancer Staging No matching staging information was found for the patient.   Oncology History   # OCT 2017- ADENO CA LUNG; STAGE IV [; LUL; bil supraclavicular LN; Left neck LN Bx]; ROS-1 MUTATED; s/p Carbo-alimta x1[oct 2017]  # NOV 1st 2017- XALKORI 250 mg BID; JAN 15th CT- PR;  # AUG 27th Chemo-RT to persistent LUL/mediastinal LN [s/p carbo-taxol- with RT; finished Sep 22nd 2018]  # LLE DVT/bil PE/Multiple strokes [? On xarelto]-Lovenox; Jan mid 2018- xarelto [lovenox-insurance issues]  # MRI brain- multiple infarcts [2d echo/bubble study-NEG's/p Neurology eval]  ------------------------------------------------------------    # # s/p TURP [Sep 2017; Dr.Cope] DEC 26th CT- distended bladder   # MOLECULAR TESTING- ROS-1 POSITIVE; ALK/EGFR-NEG; PDL-1 EXPRESSION- 90%** [HIGH] -----------------------------------------------------------------    DIAGNOSIS: Adenocarcinoma the lung  ROS-1 +  STAGE:  IV       ;GOALS: Palliative  CURRENT/MOST RECENT THERAPY- CRIZOTINIB     Primary cancer of left upper lobe of lung ([place])      INTERVAL HISTORY:  Melvin Willis 68 y.o.  male pleasant patient above history of metastatic lung cancer-Ros: Positive currently on crizotinib is here for follow-up.  In the interim patient was evaluated by urology.  Patient appetite is good.  Denies any significant worsening swelling in the legs.  No more bruising.  No falls.  No headaches.  No new shortness of breath or cough.   Review of Systems  Constitutional: Negative for chills, diaphoresis, fever, malaise/fatigue and weight loss.  HENT: Negative for nosebleeds and sore throat.   Eyes: Negative for double vision.  Respiratory: Negative for cough, hemoptysis, sputum production, shortness of breath and  wheezing.   Cardiovascular: Positive for leg swelling. Negative for chest pain, palpitations and orthopnea.  Gastrointestinal: Negative for abdominal pain, blood in stool, constipation, diarrhea, heartburn, melena, nausea and vomiting.  Genitourinary: Negative for dysuria, frequency and urgency.  Musculoskeletal: Negative for back pain and joint pain.  Skin: Negative.  Negative for itching and rash.  Neurological: Negative for dizziness, tingling, focal weakness, weakness and headaches.  Endo/Heme/Allergies: Does not bruise/bleed easily.  Psychiatric/Behavioral: Negative for depression. The patient is not nervous/anxious and does not have insomnia.       PAST MEDICAL HISTORY :  Past Medical History:  Diagnosis Date  . Abnormal prostate specific antigen 08/08/2012  . Adiposity 04/16/2015  . Chronic kidney disease (CKD), stage III (moderate) ([place]) 11/27/2016  . CVA (cerebral vascular accident) ([place]) 06/17/2016  . Diabetes mellitus without complication ([place])   . Diverticulosis of sigmoid colon 04/16/2015  . Dyslipidemia 03/18/2015  . Hemorrhoids, internal 04/16/2015  . Hypercholesteremia 04/16/2015  . Hyperlipidemia   . Hypertension   . Primary cancer of left upper lobe of lung ([place])   . Pulmonary embolism ([place])   . Wears dentures    partial upper    PAST SURGICAL HISTORY :   Past Surgical History:  Procedure Laterality Date  . COLONOSCOPY    . COLONOSCOPY WITH PROPOFOL N/A 05/06/2015   Procedure: COLONOSCOPY WITH PROPOFOL;  Surgeon: Lucilla Lame, MD;  Location: [facility];  Service: Endoscopy;  Laterality: N/A;  ASCENDING COLON POLYPS X 2 TERMINAL ILEUM BIOPSY RANDOM COLON BX. TRANSVERSE COLON POLYP SIGMOID COLON POLYP  . ESOPHAGOGASTRODUODENOSCOPY (EGD) WITH PROPOFOL N/A 05/06/2015   Procedure: ESOPHAGOGASTRODUODENOSCOPY (EGD) WITH PROPOFOL;  Surgeon: Lucilla Lame, MD;  Location: [facility];  Service: Endoscopy;  Laterality: N/A;  GASTRIC BIOPSY X1  . KIDNEY STONE  SURGERY  2017    FAMILY HISTORY :   Family History  Problem Relation Age of Onset  . Diabetes Mother   . Diabetes Father   . CAD Father   . Dementia Father   . Diabetes Sister   . Cancer Maternal Uncle        Prostate  . Cancer Cousin        prostate    SOCIAL HISTORY:   Social History   Tobacco Use  . Smoking status: Never Smoker  . Smokeless tobacco: Never Used  Substance Use Topics  . Alcohol use: No    Alcohol/week: 0.0 standard drinks  . Drug use: No    ALLERGIES:  has No Known Allergies.  MEDICATIONS:  Current Outpatient Medications  Medication Sig Dispense Refill  . atorvastatin (LIPITOR) 40 MG tablet Take 1 tablet (40 mg total) by mouth 3 (three) times a week. 48 tablet 1  . crizotinib (XALKORI) 250 MG capsule Take 1 capsule (250 mg total) by mouth 2 (two) times daily. 60 capsule 3  . finasteride (PROSCAR) 5 MG tablet Take 1 tablet (5 mg total) by mouth daily. 90 tablet 3  . fluticasone (FLONASE) 50 MCG/ACT nasal spray Place 2 sprays into both nostrils daily. 16 g 1  . glipiZIDE (GLUCOTROL) 5 MG tablet Take 0.5 tablets (2.5 mg total) by mouth 2 (two) times daily before a meal. (Patient taking differently: Take 2.5 mg by mouth daily before breakfast. ) 180 tablet 0  . metFORMIN (GLUCOPHAGE) 500 MG tablet Take 1 tablet (500 mg total) by mouth 2 (two) times daily with a meal. 180 tablet 0  . rivaroxaban (XARELTO) 20 MG TABS tablet Take 1 tablet (20 mg total) by mouth daily with supper. 30 tablet 11  . tamsulosin (FLOMAX) 0.4 MG CAPS capsule Take 1 capsule (0.4 mg total) by mouth every morning. 90 capsule 3   No current facility-administered medications for this visit.     PHYSICAL EXAMINATION: ECOG PERFORMANCE STATUS: 0 - Asymptomatic  BP (!) 154/76 (BP Location: Left Arm, Patient Position: Sitting, Cuff Size: Normal)   Pulse 61   Temp 97.8 F (36.6 C) (Tympanic)   Resp 16   Wt 169 lb (76.7 kg)   BMI 29.01 kg/m   Filed Weights   10/17/18 1004  Weight:  169 lb (76.7 kg)    Physical Exam  Constitutional: He is oriented to person, place, and time and well-developed, well-nourished, and in no distress.  Alone. He is walking by himself.  HENT:  Head: Normocephalic and atraumatic.  Mouth/Throat: Oropharynx is clear and moist. No oropharyngeal exudate.  Eyes: Pupils are equal, round, and reactive to light.  Neck: Normal range of motion. Neck supple.  Cardiovascular: Normal rate and regular rhythm.  Pulmonary/Chest: No respiratory distress. He has no wheezes.  Abdominal: Soft. Bowel sounds are normal. He exhibits no distension and no mass. There is no abdominal tenderness. There is no rebound and no guarding.  Musculoskeletal: Normal range of motion.        General: No tenderness or edema.  Neurological: He is alert and oriented to person, place, and time.  Skin: Skin is warm.  Big bruise noted on th left upper extremity/arm and forearm.  Psychiatric: Affect normal.       LABORATORY DATA:  I have reviewed the data as listed    Component Value Date/Time   NA   137 10/17/2018 0934   NA 139 01/01/2016 1000   K 3.7 10/17/2018 0934   CL 108 10/17/2018 0934   CO2 23 10/17/2018 0934   GLUCOSE 155 (H) 10/17/2018 0934   BUN 16 10/17/2018 0934   BUN 13 01/01/2016 1000   CREATININE 1.28 (H) 10/17/2018 0934   CREATININE 1.32 (H) 08/22/2018 1107   CALCIUM 8.2 (L) 10/17/2018 0934   PROT 7.2 09/19/2018 1027   PROT 7.2 01/01/2016 1000   ALBUMIN 3.6 09/19/2018 1027   ALBUMIN 4.4 01/01/2016 1000   AST 28 09/19/2018 1027   ALT 47 (H) 09/19/2018 1027   ALKPHOS 98 09/19/2018 1027   BILITOT 1.1 09/19/2018 1027   BILITOT 0.4 01/01/2016 1000   GFRNONAA 58 (L) 10/17/2018 0934   GFRNONAA 56 (L) 08/22/2018 1107   GFRAA >60 10/17/2018 0934   GFRAA 65 08/22/2018 1107    No results found for: SPEP, UPEP  Lab Results  Component Value Date   WBC 3.8 (L) 10/17/2018   NEUTROABS 1.3 (L) 10/17/2018   HGB 13.6 10/17/2018   HCT 41.5 10/17/2018   MCV  84.2 10/17/2018   PLT 234 10/17/2018      Chemistry      Component Value Date/Time   NA 137 10/17/2018 0934   NA 139 01/01/2016 1000   K 3.7 10/17/2018 0934   CL 108 10/17/2018 0934   CO2 23 10/17/2018 0934   BUN 16 10/17/2018 0934   BUN 13 01/01/2016 1000   CREATININE 1.28 (H) 10/17/2018 0934   CREATININE 1.32 (H) 08/22/2018 1107      Component Value Date/Time   CALCIUM 8.2 (L) 10/17/2018 0934   ALKPHOS 98 09/19/2018 1027   AST 28 09/19/2018 1027   ALT 47 (H) 09/19/2018 1027   BILITOT 1.1 09/19/2018 1027   BILITOT 0.4 01/01/2016 1000       RADIOGRAPHIC STUDIES: I have personally reviewed the radiological images as listed and agreed with the findings in the report. No results found.   ASSESSMENT & PLAN:  Primary cancer of left upper lobe of lung [facility]) # Adenocarcinoma of the lung metastatic/stage IV-ROS-1 positive: CT scan Jan 8th 2019- improved aeration; No evidence of metastatic disease. Stable.   # Continue crizotinib 250 mg twice a day; no clinical evidence of progression.  Stable.   # CKD- multifactorial [prostatitic obstruction/diabetes]- creatinine 1.28 stable. Reviewed urology findings/await MRI the pelvis.  # Left arm pain/ecchymoses- sec to xarelto- resolved.   # Bil LE swelling: CKD/crizotinib compression stockings; stable.   # Bilateral PE & left lower extremity DVT:  on Xarelto; stable.  # HTN-better controlled; 150s.   # DISPOSITION: # follow up in 4 weeks; labs-cbc/bmp-Dr.B   No orders of the defined types were placed in this encounter.  All questions were answered. The patient knows to call the clinic with any problems, questions or concerns.      Cammie Sickle, MD 10/17/2018 12:41 PM

## 2018-10-17 NOTE — Addendum Note (Signed)
Addended by: Sandria Bales B on: 10/17/2018 04:22 PM   Modules accepted: Orders

## 2018-10-27 ENCOUNTER — Ambulatory Visit
Admission: RE | Admit: 2018-10-27 | Discharge: 2018-10-27 | Disposition: A | Discharge status: Home / Self Care | Payer: Medicare Other | Source: Ambulatory Visit | Attending: Urology | Admitting: Urology

## 2018-10-27 DIAGNOSIS — R972 Elevated prostate specific antigen [PSA]: Secondary | ICD-10-CM

## 2018-10-27 MED ORDER — GADOBUTROL 1 MMOL/ML IV SOLN
7.0000 mL | Freq: Once | INTRAVENOUS | Status: AC | PRN
  Administered 2018-10-27: 7 mL via INTRAVENOUS

## 2018-11-03 ENCOUNTER — Telehealth: Payer: Self-pay | Admitting: Urology

## 2018-11-03 DIAGNOSIS — R972 Elevated prostate specific antigen [PSA]: Secondary | ICD-10-CM

## 2018-11-03 NOTE — Telephone Encounter (Signed)
Contacted patient regarding prostate MRI results.  He did have a PI-RADS 4 lesion in the left PZ mid gland and a PI-RADS 3 lesion in the right PZ.  The MRI results were discussed and I recommended scheduling a fusion biopsy at [facility].   Referral placed for fusion biopsy.  Please check with hematology/Dr. Rogue Bussing to see if okay to discontinue Xarelto prior to prostate biopsy.

## 2018-11-08 NOTE — Telephone Encounter (Signed)
Clearance was faxed to DR. Rogue Bussing

## 2018-11-14 ENCOUNTER — Other Ambulatory Visit: Payer: Self-pay

## 2018-11-14 ENCOUNTER — Encounter: Payer: Self-pay | Admitting: Internal Medicine

## 2018-11-14 ENCOUNTER — Inpatient Hospital Stay (HOSPITAL_BASED_OUTPATIENT_CLINIC_OR_DEPARTMENT_OTHER): Payer: Medicare Other | Admitting: Internal Medicine

## 2018-11-14 ENCOUNTER — Inpatient Hospital Stay: Payer: Medicare Other | Attending: Internal Medicine

## 2018-11-14 VITALS — BP 138/84 | HR 96 | Temp 97.5°F | Resp 16 | Wt 171.2 lb

## 2018-11-14 DIAGNOSIS — R9389 Abnormal findings on diagnostic imaging of other specified body structures: Secondary | ICD-10-CM | POA: Insufficient documentation

## 2018-11-14 DIAGNOSIS — I129 Hypertensive chronic kidney disease with stage 1 through stage 4 chronic kidney disease, or unspecified chronic kidney disease: Secondary | ICD-10-CM

## 2018-11-14 DIAGNOSIS — Z86718 Personal history of other venous thrombosis and embolism: Secondary | ICD-10-CM | POA: Diagnosis not present

## 2018-11-14 DIAGNOSIS — C3412 Malignant neoplasm of upper lobe, left bronchus or lung: Secondary | ICD-10-CM | POA: Insufficient documentation

## 2018-11-14 DIAGNOSIS — N183 Chronic kidney disease, stage 3 (moderate): Secondary | ICD-10-CM | POA: Insufficient documentation

## 2018-11-14 DIAGNOSIS — E1122 Type 2 diabetes mellitus with diabetic chronic kidney disease: Secondary | ICD-10-CM

## 2018-11-14 DIAGNOSIS — Z86711 Personal history of pulmonary embolism: Secondary | ICD-10-CM | POA: Insufficient documentation

## 2018-11-14 DIAGNOSIS — Z7901 Long term (current) use of anticoagulants: Secondary | ICD-10-CM | POA: Insufficient documentation

## 2018-11-14 DIAGNOSIS — Z79899 Other long term (current) drug therapy: Secondary | ICD-10-CM

## 2018-11-14 LAB — CBC WITH DIFFERENTIAL/PLATELET
Abs Immature Granulocytes: 0.02 10*3/uL (ref 0.00–0.07)
BASOS ABS: 0 10*3/uL (ref 0.0–0.1)
Basophils Relative: 0 %
Eosinophils Absolute: 0.2 10*3/uL (ref 0.0–0.5)
Eosinophils Relative: 3 %
HCT: 43.8 % (ref 39.0–52.0)
Hemoglobin: 14.2 g/dL (ref 13.0–17.0)
Immature Granulocytes: 0 %
LYMPHS PCT: 27 %
Lymphs Abs: 1.7 10*3/uL (ref 0.7–4.0)
MCH: 27.4 pg (ref 26.0–34.0)
MCHC: 32.4 g/dL (ref 30.0–36.0)
MCV: 84.4 fL (ref 80.0–100.0)
Monocytes Absolute: 0.7 10*3/uL (ref 0.1–1.0)
Monocytes Relative: 11 %
Neutro Abs: 3.7 10*3/uL (ref 1.7–7.7)
Neutrophils Relative %: 59 %
Platelets: 216 10*3/uL (ref 150–400)
RBC: 5.19 MIL/uL (ref 4.22–5.81)
RDW: 14.4 % (ref 11.5–15.5)
WBC: 6.3 10*3/uL (ref 4.0–10.5)
nRBC: 0 % (ref 0.0–0.2)

## 2018-11-14 LAB — COMPREHENSIVE METABOLIC PANEL
ALK PHOS: 98 U/L (ref 38–126)
ALT: 47 U/L — ABNORMAL HIGH (ref 0–44)
ANION GAP: 9 (ref 5–15)
AST: 26 U/L (ref 15–41)
Albumin: 3.4 g/dL — ABNORMAL LOW (ref 3.5–5.0)
BUN: 17 mg/dL (ref 8–23)
CO2: 21 mmol/L — AB (ref 22–32)
Calcium: 8.6 mg/dL — ABNORMAL LOW (ref 8.9–10.3)
Chloride: 104 mmol/L (ref 98–111)
Creatinine, Ser: 1.51 mg/dL — ABNORMAL HIGH (ref 0.61–1.24)
GFR calc Af Amer: 55 mL/min — ABNORMAL LOW (ref >60)
GFR calc non Af Amer: 48 mL/min — ABNORMAL LOW (ref >60)
Glucose, Bld: 206 mg/dL — ABNORMAL HIGH (ref 70–99)
Potassium: 4 mmol/L (ref 3.5–5.1)
SODIUM: 134 mmol/L — AB (ref 135–145)
Total Bilirubin: 0.7 mg/dL (ref 0.3–1.2)
Total Protein: 6.7 g/dL (ref 6.5–8.1)

## 2018-11-14 NOTE — Progress Notes (Signed)
[facility] OFFICE PROGRESS NOTE  Patient Care Team: [name], [name], MD as Medical Oncologist (Medical Oncology)  Cancer Staging No matching staging information was found for the patient.   Oncology History   # OCT 2017- ADENO CA LUNG; STAGE IV [; LUL; bil supraclavicular LN; Left neck LN Bx]; ROS-1 MUTATED; s/p Carbo-alimta x1[oct 2017]  # NOV 1st 2017- XALKORI 250 mg BID; JAN 15th CT- PR;  # AUG 27th Chemo-RT to persistent LUL/mediastinal LN [s/p carbo-taxol- with RT; finished Sep 22nd 2018]  # LLE DVT/bil PE/Multiple strokes [? On xarelto]-Lovenox; Jan mid 2018- xarelto [lovenox-insurance issues]  # MRI brain- multiple infarcts [2d echo/bubble study-NEG's/p Neurology eval]  ------------------------------------------------------------    # # s/p TURP [Sep 2017; Dr.Cope] DEC 26th CT- distended bladder   # MOLECULAR TESTING- ROS-1 POSITIVE; ALK/EGFR-NEG; PDL-1 EXPRESSION- 90%** [HIGH] -----------------------------------------------------------------    DIAGNOSIS: Adenocarcinoma the lung  ROS-1 +  STAGE:  IV       ;GOALS: Palliative  CURRENT/MOST RECENT THERAPY- CRIZOTINIB     Primary cancer of left upper lobe of lung ([place])      INTERVAL HISTORY:  Melvin Willis 68 y.o.  male pleasant patient above history of metastatic lung cancer-Ros: Positive currently on crizotinib is here for follow-up.  In the interim patient had MRI of his prostate; as per patient is recommended a biopsy of the prostate.  He is concerned about his ongoing anticoagulation.  Denies any significant difficulty urination.  Appetite is fair.  No falls no headaches.  Complains of mild chest pain pleuritic left upper chest chronic.   Review of Systems  Constitutional: Negative for chills, diaphoresis, fever, malaise/fatigue and weight loss.  HENT: Negative for nosebleeds and sore throat.   Eyes: Negative for double  vision.  Respiratory: Negative for cough, hemoptysis, sputum production, shortness of breath and wheezing.   Cardiovascular: Positive for leg swelling. Negative for chest pain (Left upper chest wall pain chronic), palpitations and orthopnea.  Gastrointestinal: Negative for abdominal pain, blood in stool, constipation, diarrhea, heartburn, melena, nausea and vomiting.  Genitourinary: Negative for dysuria, frequency and urgency.  Musculoskeletal: Negative for back pain and joint pain.  Skin: Negative.  Negative for itching and rash.  Neurological: Negative for dizziness, tingling, focal weakness, weakness and headaches.  Endo/Heme/Allergies: Does not bruise/bleed easily.  Psychiatric/Behavioral: Negative for depression. The patient is not nervous/anxious and does not have insomnia.       PAST MEDICAL HISTORY :  Past Medical History:  Diagnosis Date  . Abnormal prostate specific antigen 08/08/2012  . Adiposity 04/16/2015  . Chronic kidney disease (CKD), stage III (moderate) ([place]) 11/27/2016  . CVA (cerebral vascular accident) ([place]) 06/17/2016  . Diabetes mellitus without complication ([place])   . Diverticulosis of sigmoid colon 04/16/2015  . Dyslipidemia 03/18/2015  . Hemorrhoids, internal 04/16/2015  . Hypercholesteremia 04/16/2015  . Hyperlipidemia   . Hypertension   . Primary cancer of left upper lobe of lung ([place])   . Pulmonary embolism ([place])   . Wears dentures    partial upper    PAST SURGICAL HISTORY :   Past Surgical History:  Procedure Laterality Date  . COLONOSCOPY    . COLONOSCOPY WITH PROPOFOL N/A 05/06/2015   Procedure: COLONOSCOPY WITH PROPOFOL;  Surgeon: Lucilla Lame, MD;  Location: [facility];  Service: Endoscopy;  Laterality: N/A;  ASCENDING COLON POLYPS X 2 TERMINAL ILEUM BIOPSY RANDOM COLON BX. TRANSVERSE COLON POLYP SIGMOID COLON POLYP  .  ESOPHAGOGASTRODUODENOSCOPY (EGD) WITH PROPOFOL N/A 05/06/2015   Procedure: ESOPHAGOGASTRODUODENOSCOPY (EGD) WITH PROPOFOL;   Surgeon: Lucilla Lame, MD;  Location: [facility];  Service: Endoscopy;  Laterality: N/A;  GASTRIC BIOPSY X1  . KIDNEY STONE SURGERY  2017    FAMILY HISTORY :   Family History  Problem Relation Age of Onset  . Diabetes Mother   . Diabetes Father   . CAD Father   . Dementia Father   . Diabetes Sister   . Cancer Maternal Uncle        Prostate  . Cancer Cousin        prostate    SOCIAL HISTORY:   Social History   Tobacco Use  . Smoking status: Never Smoker  . Smokeless tobacco: Never Used  Substance Use Topics  . Alcohol use: No    Alcohol/week: 0.0 standard drinks  . Drug use: No    ALLERGIES:  has No Known Allergies.  MEDICATIONS:  Current Outpatient Medications  Medication Sig Dispense Refill  . atorvastatin (LIPITOR) 40 MG tablet Take 1 tablet (40 mg total) by mouth 3 (three) times a week. 48 tablet 1  . crizotinib (XALKORI) 250 MG capsule Take 1 capsule (250 mg total) by mouth 2 (two) times daily. 60 capsule 3  . finasteride (PROSCAR) 5 MG tablet Take 1 tablet (5 mg total) by mouth daily. 90 tablet 3  . fluticasone (FLONASE) 50 MCG/ACT nasal spray Place 2 sprays into both nostrils daily. 16 g 1  . glipiZIDE (GLUCOTROL) 5 MG tablet Take 0.5 tablets (2.5 mg total) by mouth 2 (two) times daily before a meal. (Patient taking differently: Take 2.5 mg by mouth daily before breakfast. ) 180 tablet 0  . metFORMIN (GLUCOPHAGE) 500 MG tablet Take 1 tablet (500 mg total) by mouth 2 (two) times daily with a meal. 180 tablet 0  . rivaroxaban (XARELTO) 20 MG TABS tablet Take 1 tablet (20 mg total) by mouth daily with supper. 30 tablet 11  . tamsulosin (FLOMAX) 0.4 MG CAPS capsule Take 1 capsule (0.4 mg total) by mouth every morning. 90 capsule 3   No current facility-administered medications for this visit.     PHYSICAL EXAMINATION: ECOG PERFORMANCE STATUS: 0 - Asymptomatic  BP 138/84 (BP Location: Left Arm, Patient Position: Sitting, Cuff Size: Normal)   Pulse 96    Temp (!) 97.5 F (36.4 C) (Tympanic)   Resp 16   Wt 171 lb 3.2 oz (77.7 kg)   BMI 29.39 kg/m   Filed Weights   11/14/18 1107  Weight: 171 lb 3.2 oz (77.7 kg)    Physical Exam  Constitutional: He is oriented to person, place, and time and well-developed, well-nourished, and in no distress.  Alone. He is walking by himself.  HENT:  Head: Normocephalic and atraumatic.  Mouth/Throat: Oropharynx is clear and moist. No oropharyngeal exudate.  Eyes: Pupils are equal, round, and reactive to light.  Neck: Normal range of motion. Neck supple.  Cardiovascular: Normal rate and regular rhythm.  Pulmonary/Chest: No respiratory distress. He has no wheezes.  Abdominal: Soft. Bowel sounds are normal. He exhibits no distension and no mass. There is no abdominal tenderness. There is no rebound and no guarding.  Musculoskeletal: Normal range of motion.        General: No tenderness or edema.  Neurological: He is alert and oriented to person, place, and time.  Skin: Skin is warm.  Big bruise noted on th left upper extremity/arm and forearm.  Psychiatric: Affect normal.  LABORATORY DATA:  I have reviewed the data as listed    Component Value Date/Time   NA 134 (L) 11/14/2018 1032   NA 139 01/01/2016 1000   K 4.0 11/14/2018 1032   CL 104 11/14/2018 1032   CO2 21 (L) 11/14/2018 1032   GLUCOSE 206 (H) 11/14/2018 1032   BUN 17 11/14/2018 1032   BUN 13 01/01/2016 1000   CREATININE 1.51 (H) 11/14/2018 1032   CREATININE 1.32 (H) 08/22/2018 1107   CALCIUM 8.6 (L) 11/14/2018 1032   PROT 6.7 11/14/2018 1032   PROT 7.2 01/01/2016 1000   ALBUMIN 3.4 (L) 11/14/2018 1032   ALBUMIN 4.4 01/01/2016 1000   AST 26 11/14/2018 1032   ALT 47 (H) 11/14/2018 1032   ALKPHOS 98 11/14/2018 1032   BILITOT 0.7 11/14/2018 1032   BILITOT 0.4 01/01/2016 1000   GFRNONAA 48 (L) 11/14/2018 1032   GFRNONAA 56 (L) 08/22/2018 1107   GFRAA 55 (L) 11/14/2018 1032   GFRAA 65 08/22/2018 1107    No results found  for: SPEP, UPEP  Lab Results  Component Value Date   WBC 6.3 11/14/2018   NEUTROABS 3.7 11/14/2018   HGB 14.2 11/14/2018   HCT 43.8 11/14/2018   MCV 84.4 11/14/2018   PLT 216 11/14/2018      Chemistry      Component Value Date/Time   NA 134 (L) 11/14/2018 1032   NA 139 01/01/2016 1000   K 4.0 11/14/2018 1032   CL 104 11/14/2018 1032   CO2 21 (L) 11/14/2018 1032   BUN 17 11/14/2018 1032   BUN 13 01/01/2016 1000   CREATININE 1.51 (H) 11/14/2018 1032   CREATININE 1.32 (H) 08/22/2018 1107      Component Value Date/Time   CALCIUM 8.6 (L) 11/14/2018 1032   ALKPHOS 98 11/14/2018 1032   AST 26 11/14/2018 1032   ALT 47 (H) 11/14/2018 1032   BILITOT 0.7 11/14/2018 1032   BILITOT 0.4 01/01/2016 1000       RADIOGRAPHIC STUDIES: I have personally reviewed the radiological images as listed and agreed with the findings in the report. No results found.   ASSESSMENT & PLAN:  Primary cancer of left upper lobe of lung [facility]) # Adenocarcinoma of the lung metastatic/stage IV-ROS-1 positive: CT scan Jan 8th 2019- improved aeration; No evidence of metastatic disease. STABLE.    # Continue crizotinib 250 mg twice a day; no clinical evidence of progression.  We will repeat CT scan in approximately 6 weeks.  # CKD-stage III multifactorial [prostatitic obstruction/diabetes]- creatinine 1.5 stable.  # Prostate lesions- s/p MRI; ?MR biopsy. Last PSA Dec 2019- 7.1.  I will recheck a PSA again at next visit.  Discussed with the patient the concerns for prostatic malignancy; however this has to be weighed in the context of his metastatic lung cancer/ongoing anticoagulation.will discuss with urology.  Left a message for Dr. Bernardo Heater.   # Bil LE swelling: CKD/crizotinib compression stockings; stable  # Bilateral PE & left lower extremity DVT:  on Xarelto; stable  # HTN-better controlled; 150s.  Stable  # DISPOSITION: # follow up in 6 weeks; labs-cbc/cmp/PSA; CT scan prior-Dr.B   Orders Placed  This Encounter  Procedures  . CT CHEST WO CONTRAST    Standing Status:   Future    Standing Expiration Date:   11/14/2019    Order Specific Question:   Preferred imaging location?    Answer:   [facility]    Order Specific Question:   Radiology Contrast Protocol -  do NOT remove file path    Answer:   [email or web address]    Order Specific Question:   ** REASON FOR EXAM (FREE TEXT)    Answer:   lung cnacer   All questions were answered. The patient knows to call the clinic with any problems, questions or concerns.      Cammie Sickle, MD 11/14/2018 12:57 PM

## 2018-11-14 NOTE — Assessment & Plan Note (Addendum)
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 STABLE.    # Continue crizotinib 250 mg twice a day; no clinical evidence of progression.  We will repeat CT scan in approximately 6 weeks.  # CKD-stage III multifactorial [prostatitic obstruction/diabetes]- creatinine 1.5 stable.  # Prostate lesions- s/p MRI; ?MR biopsy. Last PSA Dec 2019- 7.1.  I will recheck a PSA again at next visit.  Discussed with the patient the concerns for prostatic malignancy; however this has to be weighed in the context of his metastatic lung cancer/ongoing anticoagulation.will discuss with urology.  Left a message for Dr. Bernardo Heater.   # Bil LE swelling: CKD/crizotinib compression stockings; stable  # Bilateral PE & left lower extremity DVT:  on Xarelto; stable  # HTN-better controlled; 150s.  Stable  # DISPOSITION: # follow up in 6 weeks; labs-cbc/cmp/PSA; CT scan prior-Dr.B

## 2018-11-21 ENCOUNTER — Other Ambulatory Visit: Payer: Self-pay

## 2018-11-21 DIAGNOSIS — N183 Chronic kidney disease, stage 3 (moderate): Principal | ICD-10-CM

## 2018-11-21 DIAGNOSIS — E1122 Type 2 diabetes mellitus with diabetic chronic kidney disease: Secondary | ICD-10-CM

## 2018-11-21 MED ORDER — METFORMIN HCL 500 MG PO TABS: 500.0000 mg | ORAL_TABLET | Freq: Two times a day (BID) | ORAL | 0 refills | Status: DC

## 2018-11-21 NOTE — Telephone Encounter (Signed)
Request for diabetes medication. Metformin to [facility].- per insurance asking we send in a 90 day supply.   Last office visit pertaining to diabetes: 08/22/2018    Lab Results  Component Value Date   HGBA1C 8.1 (H) 08/22/2018     Follow up on 12/29/2018

## 2018-11-22 ENCOUNTER — Other Ambulatory Visit: Payer: Self-pay | Admitting: Pharmacist

## 2018-11-22 DIAGNOSIS — C3412 Malignant neoplasm of upper lobe, left bronchus or lung: Secondary | ICD-10-CM

## 2018-11-22 MED ORDER — CRIZOTINIB 250 MG PO CAPS: 250.0000 mg | ORAL_CAPSULE | Freq: Two times a day (BID) | ORAL | 3 refills | Status: DC

## 2018-11-22 NOTE — Progress Notes (Signed)
Oral Chemotherapy Pharmacist Encounter   Received faxed refill request for Xalkori for Mr. Delaughter from [facility]. Refill approved by Dr. Rogue Bussing and refill e-scribed.  Darl Pikes, PharmD, BCPS, [facility] Hematology/Oncology Clinical Pharmacist [facility]/AP [facility] [phone]  11/22/2018 11:53 AM

## 2018-12-22 ENCOUNTER — Telehealth: Payer: Self-pay | Admitting: *Deleted

## 2018-12-22 NOTE — Telephone Encounter (Signed)
Contacted the patient regarding his upcoming apt on 12/26/2018. Explained the process of the tele-visit. patient requested call to his personal cell phone).   He states he does not have a Research scientist (physical sciences) a smart phone- He is agreeable to a phone call.

## 2018-12-23 ENCOUNTER — Other Ambulatory Visit: Payer: Self-pay

## 2018-12-23 ENCOUNTER — Ambulatory Visit
Admission: RE | Admit: 2018-12-23 | Discharge: 2018-12-23 | Disposition: A | Discharge status: Home / Self Care | Payer: Medicare Other | Source: Ambulatory Visit | Attending: Internal Medicine | Admitting: Internal Medicine

## 2018-12-23 DIAGNOSIS — C3412 Malignant neoplasm of upper lobe, left bronchus or lung: Secondary | ICD-10-CM | POA: Insufficient documentation

## 2018-12-23 DIAGNOSIS — R918 Other nonspecific abnormal finding of lung field: Secondary | ICD-10-CM | POA: Diagnosis not present

## 2018-12-25 ENCOUNTER — Other Ambulatory Visit: Payer: Self-pay

## 2018-12-26 ENCOUNTER — Other Ambulatory Visit: Payer: Self-pay

## 2018-12-26 ENCOUNTER — Inpatient Hospital Stay: Payer: Medicare Other | Attending: Internal Medicine

## 2018-12-26 ENCOUNTER — Encounter: Payer: Self-pay | Admitting: Internal Medicine

## 2018-12-26 ENCOUNTER — Inpatient Hospital Stay (HOSPITAL_BASED_OUTPATIENT_CLINIC_OR_DEPARTMENT_OTHER): Payer: Medicare Other | Admitting: Internal Medicine

## 2018-12-26 DIAGNOSIS — R2243 Localized swelling, mass and lump, lower limb, bilateral: Secondary | ICD-10-CM | POA: Diagnosis not present

## 2018-12-26 DIAGNOSIS — C3412 Malignant neoplasm of upper lobe, left bronchus or lung: Secondary | ICD-10-CM

## 2018-12-26 DIAGNOSIS — I82402 Acute embolism and thrombosis of unspecified deep veins of left lower extremity: Secondary | ICD-10-CM | POA: Insufficient documentation

## 2018-12-26 DIAGNOSIS — N429 Disorder of prostate, unspecified: Secondary | ICD-10-CM | POA: Insufficient documentation

## 2018-12-26 DIAGNOSIS — N183 Chronic kidney disease, stage 3 (moderate): Secondary | ICD-10-CM | POA: Insufficient documentation

## 2018-12-26 DIAGNOSIS — Z7901 Long term (current) use of anticoagulants: Secondary | ICD-10-CM | POA: Insufficient documentation

## 2018-12-26 DIAGNOSIS — Z79899 Other long term (current) drug therapy: Secondary | ICD-10-CM | POA: Diagnosis not present

## 2018-12-26 DIAGNOSIS — I2699 Other pulmonary embolism without acute cor pulmonale: Secondary | ICD-10-CM | POA: Diagnosis not present

## 2018-12-26 DIAGNOSIS — I129 Hypertensive chronic kidney disease with stage 1 through stage 4 chronic kidney disease, or unspecified chronic kidney disease: Secondary | ICD-10-CM | POA: Insufficient documentation

## 2018-12-26 DIAGNOSIS — C799 Secondary malignant neoplasm of unspecified site: Secondary | ICD-10-CM | POA: Diagnosis not present

## 2018-12-26 LAB — CBC WITH DIFFERENTIAL/PLATELET
Abs Immature Granulocytes: 0.02 10*3/uL (ref 0.00–0.07)
Basophils Absolute: 0 10*3/uL (ref 0.0–0.1)
Basophils Relative: 1 %
Eosinophils Absolute: 0.2 10*3/uL (ref 0.0–0.5)
Eosinophils Relative: 4 %
HCT: 46.1 % (ref 39.0–52.0)
Hemoglobin: 15.4 g/dL (ref 13.0–17.0)
Immature Granulocytes: 0 %
Lymphocytes Relative: 31 %
Lymphs Abs: 1.7 10*3/uL (ref 0.7–4.0)
MCH: 27.7 pg (ref 26.0–34.0)
MCHC: 33.4 g/dL (ref 30.0–36.0)
MCV: 82.9 fL (ref 80.0–100.0)
Monocytes Absolute: 0.7 10*3/uL (ref 0.1–1.0)
Monocytes Relative: 11 %
Neutro Abs: 3.1 10*3/uL (ref 1.7–7.7)
Neutrophils Relative %: 53 %
Platelets: 243 10*3/uL (ref 150–400)
RBC: 5.56 MIL/uL (ref 4.22–5.81)
RDW: 14.6 % (ref 11.5–15.5)
WBC: 5.7 10*3/uL (ref 4.0–10.5)
nRBC: 0 % (ref 0.0–0.2)

## 2018-12-26 LAB — COMPREHENSIVE METABOLIC PANEL
ALT: 63 U/L — ABNORMAL HIGH (ref 0–44)
AST: 36 U/L (ref 15–41)
Albumin: 3.6 g/dL (ref 3.5–5.0)
Alkaline Phosphatase: 102 U/L (ref 38–126)
Anion gap: 10 (ref 5–15)
BUN: 18 mg/dL (ref 8–23)
CO2: 20 mmol/L — ABNORMAL LOW (ref 22–32)
Calcium: 8.8 mg/dL — ABNORMAL LOW (ref 8.9–10.3)
Chloride: 104 mmol/L (ref 98–111)
Creatinine, Ser: 1.68 mg/dL — ABNORMAL HIGH (ref 0.61–1.24)
GFR calc Af Amer: 49 mL/min — ABNORMAL LOW (ref >60)
GFR calc non Af Amer: 42 mL/min — ABNORMAL LOW (ref >60)
Glucose, Bld: 272 mg/dL — ABNORMAL HIGH (ref 70–99)
Potassium: 4.6 mmol/L (ref 3.5–5.1)
Sodium: 134 mmol/L — ABNORMAL LOW (ref 135–145)
Total Bilirubin: 0.6 mg/dL (ref 0.3–1.2)
Total Protein: 7.4 g/dL (ref 6.5–8.1)

## 2018-12-26 LAB — PSA: Prostatic Specific Antigen: 6.96 ng/mL — ABNORMAL HIGH (ref 0.00–4.00)

## 2018-12-26 NOTE — Assessment & Plan Note (Addendum)
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  # Continue crizotinib 250 mg twice a day; no clinical evidence of progression.   # CKD-stage III multifactorial [prostatitic obstruction/diabetes]- creatinine 1.6.  Recommend increase fluid intake.  # Prostate lesions- s/p MRI-discussed with Dr. Bernardo Heater.  Recommend holding off MR biopsy given his metastatic lung cancer/and coronavirus pandemic.  # Bil LE swelling: CKD/crizotinib compression stockings; stable  # Bilateral PE & left lower extremity DVT:  on Xarelto; stable  # HTN-better controlled; 150s.  Stable  # DISPOSITION: # follow up in 6 weeks-MD; labs-cbc/cmp/PSA; Dr.B

## 2018-12-26 NOTE — Progress Notes (Signed)
I connected with Melvin Willis on 12/26/18 at  2:30 PM EDT by telephone visit and verified that I am speaking with the correct person using two identifiers.  I discussed the limitations, risks, security and privacy concerns of performing an evaluation and management service by telemedicine and the availability of in-person appointments. I also discussed with the patient that there may be a patient responsible charge related to this service. The patient expressed understanding and agreed to proceed.    Other persons participating in the visit and their role in the encounter: none  Patient's location: home  Provider's location: home   Oncology History   # OCT 2017- ADENO CA LUNG; STAGE IV [; LUL; bil supraclavicular LN; Left neck LN Bx]; ROS-1 MUTATED; s/p Carbo-alimta x1[oct 2017]  # NOV 1st 2017- XALKORI 250 mg BID; JAN 15th CT- PR;  # AUG 27th Chemo-RT to persistent LUL/mediastinal LN [s/p carbo-taxol- with RT; finished Sep 22nd 2018]  # LLE DVT/bil PE/Multiple strokes [? On xarelto]-Lovenox; Jan mid 2018- xarelto [lovenox-insurance issues]  # MRI brain- multiple infarcts [2d echo/bubble study-NEG's/p Neurology eval]  ------------------------------------------------------------    # # s/p TURP [Sep 2017; Dr.Cope] DEC 26th CT- distended bladder   # MOLECULAR TESTING- ROS-1 POSITIVE; ALK/EGFR-NEG; PDL-1 EXPRESSION- 90%** [HIGH] -----------------------------------------------------------------    DIAGNOSIS: Adenocarcinoma the lung  ROS-1 +  STAGE:  IV       ;GOALS: Palliative  CURRENT/MOST RECENT THERAPY- CRIZOTINIB     Primary cancer of left upper lobe of lung ([place])    Chief Complaint: lung cancer   History of present illness:Melvin Willis 68 y.o.  male with history of lung cancer.   Patient denies any worsening shortness of breath or cough.  Chronic mild swelling in the legs.  Not any worse.  No nausea no vomiting.  No headaches.  Observation/objective:  Creatinine 1.6.  Hemoglobin 13.  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  # Continue crizotinib 250 mg twice a day; no clinical evidence of progression.   # CKD-stage III multifactorial [prostatitic obstruction/diabetes]- creatinine 1.6.  Recommend increase fluid intake.  # Prostate lesions- s/p MRI-discussed with Dr. Bernardo Heater.  Recommend holding off MR biopsy given his metastatic lung cancer/and coronavirus pandemic.  # Bil LE swelling: CKD/crizotinib compression stockings; stable  # Bilateral PE & left lower extremity DVT:  on Xarelto; stable  # HTN-better controlled; 150s.  Stable  # DISPOSITION: # follow up in 6 weeks-MD; labs-cbc/cmp/PSA; Dr.B  Follow-up instructions:  I discussed the assessment and treatment plan with the patient.  The patient was provided an opportunity to ask questions and all were answered.  The patient agreed with the plan and demonstrated understanding of instructions.  The patient was advised to call back or seek an in person evaluation if the symptoms worsen or if the condition fails to improve as anticipated.  I provided 12 minutes of non face-to-face telephone visit time during this encounter, and > 50% was spent counseling as documented under my assessment & plan.   Dr. Charlaine Dalton [place] at [facility] 12/26/2018 3:04 PM

## 2018-12-29 ENCOUNTER — Ambulatory Visit (INDEPENDENT_AMBULATORY_CARE_PROVIDER_SITE_OTHER): Payer: Medicare Other | Admitting: Family Medicine

## 2018-12-29 ENCOUNTER — Encounter: Payer: Self-pay | Admitting: Family Medicine

## 2018-12-29 ENCOUNTER — Other Ambulatory Visit: Payer: Self-pay

## 2018-12-29 VITALS — BP 138/80 | HR 87 | Temp 98.1°F | Resp 16 | Ht 64.0 in | Wt 170.9 lb

## 2018-12-29 DIAGNOSIS — E7800 Pure hypercholesterolemia, unspecified: Secondary | ICD-10-CM

## 2018-12-29 DIAGNOSIS — Z86711 Personal history of pulmonary embolism: Secondary | ICD-10-CM

## 2018-12-29 DIAGNOSIS — N183 Chronic kidney disease, stage 3 unspecified: Secondary | ICD-10-CM

## 2018-12-29 DIAGNOSIS — C3412 Malignant neoplasm of upper lobe, left bronchus or lung: Secondary | ICD-10-CM | POA: Diagnosis not present

## 2018-12-29 DIAGNOSIS — E1122 Type 2 diabetes mellitus with diabetic chronic kidney disease: Secondary | ICD-10-CM | POA: Diagnosis not present

## 2018-12-29 DIAGNOSIS — Z8673 Personal history of transient ischemic attack (TIA), and cerebral infarction without residual deficits: Secondary | ICD-10-CM | POA: Diagnosis not present

## 2018-12-29 LAB — POCT UA - MICROALBUMIN: Microalbumin Ur, POC: 50 mg/L

## 2018-12-29 LAB — POCT GLYCOSYLATED HEMOGLOBIN (HGB A1C): Hemoglobin A1C: 7.2 % — AB (ref 4.0–5.6)

## 2018-12-29 MED ORDER — ATORVASTATIN CALCIUM 40 MG PO TABS: 40.0000 mg | ORAL_TABLET | ORAL | 1 refills | Status: DC

## 2018-12-29 MED ORDER — LISINOPRIL 5 MG PO TABS: 5.0000 mg | ORAL_TABLET | Freq: Every day | ORAL | 1 refills | Status: DC

## 2018-12-29 MED ORDER — METFORMIN HCL 500 MG PO TABS: 500.0000 mg | ORAL_TABLET | Freq: Two times a day (BID) | ORAL | 0 refills | Status: DC

## 2018-12-29 MED ORDER — GLIPIZIDE 5 MG PO TABS: 2.5000 mg | ORAL_TABLET | Freq: Two times a day (BID) | ORAL | 0 refills | Status: DC

## 2018-12-29 NOTE — Progress Notes (Signed)
Name: Melvin Willis   MRN: [id number]    DOB: 07-02-1951   Date:12/29/2018       Progress Note  Subjective  Chief Complaint  Chief Complaint  Patient presents with  . Follow-up  . Diabetes    highest 200s & lowest 80s this morning it was 125  . Hyperlipidemia  . Chronic Kidney Disease    HPI  DMII: he still taking metformin twice daly, could not tolerate a higher dose because if caused nausea and vomiting and we added low dose glipizide in Dec, taking half pill twice daily, his  last hgbA1C was 7.7%,8.8% , 8.2% 8.1% today it is 7.2%, urine micro is 50, we will resume lisinopril but at a very low dose to avoid dizziness that he had before  .He states higher dose of glipizide causes hypoglycemia. . FSBS in am's has been 80's-159, usually fasting 140's .He likes desserts, eats it daily, only exercising once a day. Taking lipitor a few times a week , last lipid panel was at goal .   Chronic DVT left leg, history of PE : on long term Xarelto, rx given by Hematologist. He denies easy bruising. He was diagnosed with DVT and PE prior to cancer diagnosis back in 2017. He has some leg edema and wears compression stocking hoses daily. He is on Xarelto for a life time, he denies bruising or bleeding.   Primary lung cancer left side with metastases to mediastinum: he is under the care of oncologist, Dr. Mickel Fuchs. He is compliant with regiment and follow ups, last scan was stable, he also has elevated PSA but decided not to have biopsy at this time secondary to risk ( COVID-19 and takes Xarelto)   History of CVA: initially had dizziness and left side weakness, but no sequela now, still on statin and also on Xarelto, only takes statins a few times a week and last lipid panel was at goal .Unchanged    Patient Active Problem List   Diagnosis Date Noted  . Pulmonary embolus ([place]) 04/07/2018  . Chronic deep vein thrombosis (DVT) of left lower extremity ([place]) 01/05/2018  . Chronic kidney  disease (CKD), stage III (moderate) ([place]) 11/27/2016  . Old cerebrovascular accident (CVA) without late effect   . Blurred vision, bilateral 06/23/2016  . Primary cancer of left upper lobe of lung ([place]) 06/12/2016  . Mediastinal mass   . History of nephrolithiasis 03/27/2016  . Pharyngeal dysphagia 01/01/2016  . Benign neoplasm of ascending colon   . Benign neoplasm of sigmoid colon   . Benign neoplasm of transverse colon   . Diverticulosis of large intestine without diverticulitis   . Overweight (BMI 25.0-29.9) 04/16/2015  . Controlled diabetes mellitus with chronic kidney disease ([place]) 03/18/2015  . Dyslipidemia 03/18/2015  . Disorder of male genital organ 08/08/2012  . 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 Elevated prostate specific antigen (PSA) 08/08/2012    Past Surgical History:  Procedure Laterality Date  . COLONOSCOPY    . COLONOSCOPY WITH PROPOFOL N/A 05/06/2015   Procedure: COLONOSCOPY WITH PROPOFOL;  Surgeon: Lucilla Lame, MD;  Location: [facility];  Service: Endoscopy;  Laterality: N/A;  ASCENDING COLON POLYPS X 2 TERMINAL ILEUM BIOPSY RANDOM COLON BX. TRANSVERSE COLON POLYP SIGMOID COLON POLYP  . ESOPHAGOGASTRODUODENOSCOPY (EGD) WITH PROPOFOL N/A 05/06/2015   Procedure: ESOPHAGOGASTRODUODENOSCOPY (EGD) WITH PROPOFOL;  Surgeon: Lucilla Lame, MD;  Location: [facility];  Service: Endoscopy;  Laterality: N/A;  GASTRIC BIOPSY X1  . KIDNEY STONE SURGERY  2017    Family History  Problem Relation Age of Onset  . Diabetes Mother   . Diabetes Father   . CAD Father   . Dementia Father   . Diabetes Sister   . Cancer Maternal Uncle        Prostate  . Cancer Cousin        prostate    Social History   Socioeconomic History  . Marital status: Married    Spouse name: Melissa   . Number of children: 3  . Years of education: Not on file  . Highest education level: Not on file  Occupational History  . Occupation: disable     Comment:  from CVA and lung cancer  Social Needs  . Financial resource strain: Somewhat hard  . Food insecurity:    Worry: Never true    Inability: Never true  . Transportation needs:    Medical: No    Non-medical: No  Tobacco Use  . Smoking status: Never Smoker  . Smokeless tobacco: Never Used  Substance and Sexual Activity  . Alcohol use: No    Alcohol/week: 0.0 standard drinks  . Drug use: No  . Sexual activity: Yes    Partners: Female  Lifestyle  . Physical activity:    Days per week: 1 day    Minutes per session: 40 min  . Stress: Not at all  Relationships  . Social connections:    Talks on phone: More than three times a week    Gets together: Three times a week    Attends religious service: More than 4 times per year    Active member of club or organization: No    Attends meetings of clubs or organizations: Never    Relationship status: Married  . Intimate partner violence:    Fear of current or ex partner: No    Emotionally abused: No    Physically abused: No    Forced sexual activity: No  Other Topics Concern  . Not on file  Social History Narrative   Used to work until 2 years ago when got sick with DVT leg , PE , CVA and found out he had lung cancer. He has medicare now      Current Outpatient Medications:  .  atorvastatin (LIPITOR) 40 MG tablet, Take 1 tablet (40 mg total) by mouth 3 (three) times a week., Disp: 48 tablet, Rfl: 1 .  crizotinib (XALKORI) 250 MG capsule, Take 1 capsule (250 mg total) by mouth 2 (two) times daily., Disp: 60 capsule, Rfl: 3 .  finasteride (PROSCAR) 5 MG tablet, Take 1 tablet (5 mg total) by mouth daily., Disp: 90 tablet, Rfl: 3 .  glipiZIDE (GLUCOTROL) 5 MG tablet, Take 0.5 tablets (2.5 mg total) by mouth 2 (two) times daily before a meal. (Patient taking differently: Take 2.5 mg by mouth daily before breakfast. ), Disp: 180 tablet, Rfl: 0 .  metFORMIN (GLUCOPHAGE) 500 MG tablet, Take 1 tablet (500 mg total) by mouth 2 (two) times daily  with a meal., Disp: 180 tablet, Rfl: 0 .  rivaroxaban (XARELTO) 20 MG TABS tablet, Take 1 tablet (20 mg total) by mouth daily with supper., Disp: 30 tablet, Rfl: 11 .  tamsulosin (FLOMAX) 0.4 MG CAPS capsule, Take 1 capsule (0.4 mg total) by mouth every morning., Disp: 90 capsule, Rfl: 3 .  fluticasone (FLONASE) 50 MCG/ACT nasal spray, Place 2 sprays into both nostrils daily., Disp: 16 g, Rfl: 1  No Known Allergies  I personally  reviewed active problem list, medication list, allergies, family history, social history with the patient/caregiver today.   ROS  Constitutional: Negative for fever or weight change.  Respiratory: Negative for cough and shortness of breath.   Cardiovascular: Negative for chest pain or palpitations.  Gastrointestinal: Negative for abdominal pain, no bowel changes.  Musculoskeletal: Negative for gait problem or joint swelling.  Skin: Negative for rash.  Neurological: Negative for dizziness or headache.  No other specific complaints in a complete review of systems (except as listed in HPI above).  Objective  Vitals:   12/29/18 0820  BP: 138/80  Pulse: 87  Resp: 16  Temp: 98.1 F (36.7 C)  TempSrc: Oral  SpO2: 98%  Weight: 170 lb 14.4 oz (77.5 kg)  Height: 5\' 4"  (1.626 m)    Body mass index is 29.33 kg/m.  Physical Exam  Constitutional: Patient appears well-developed and well-nourished. Obese  No distress.  HEENT: head atraumatic, normocephalic, neck supple, throat within normal limits Cardiovascular: Normal rate, regular rhythm and normal heart sounds.  No murmur heard. Trace lower  BLE edema. Pulmonary/Chest: Effort normal and breath sounds normal. No respiratory distress. Abdominal: Soft.  There is no tenderness. Psychiatric: Patient has a normal mood and affect. behavior is normal. Judgment and thought content normal.    PHQ2/9: Depression screen [facility] 2/9 12/29/2018 08/22/2018 04/07/2018 01/05/2018 10/01/2017  Decreased Interest 0 0 0 1 0  Down,  Depressed, Hopeless 0 0 0 0 0  PHQ - 2 Score 0 0 0 1 0  Altered sleeping 0 - 0 0 -  Tired, decreased energy 0 - 1 1 -  Change in appetite 0 - 0 1 -  Feeling bad or failure about yourself  0 - 0 - -  Trouble concentrating 0 - 0 0 -  Moving slowly or fidgety/restless 0 - 0 0 -  Suicidal thoughts 0 - 0 0 -  PHQ-9 Score 0 - 1 3 -  Difficult doing work/chores Not difficult at all - Not difficult at all Not difficult at all -    phq 9 is negative   Fall Risk: Fall Risk  12/29/2018 08/22/2018 04/07/2018 01/05/2018 10/01/2017  Falls in the past year? 0 0 No No No  Number falls in past yr: 0 - - - -  Injury with Fall? 0 - - - -     Functional Status Survey: Is the patient deaf or have difficulty hearing?: No Does the patient have difficulty seeing, even when wearing glasses/contacts?: No(patient uses reading glasses) Does the patient have difficulty concentrating, remembering, or making decisions?: No Does the patient have difficulty walking or climbing stairs?: No Does the patient have difficulty dressing or bathing?: No Does the patient have difficulty doing errands alone such as visiting a doctor's office or shopping?: No    Assessment & Plan   1. Type 2 diabetes mellitus with stage 3 chronic kidney disease, without long-term current use of insulin (HCC)  - POCT glycosylated hemoglobin (Hb A1C) - POCT UA - Microalbumin - metFORMIN (GLUCOPHAGE) 500 MG tablet; Take 1 tablet (500 mg total) by mouth 2 (two) times daily with a meal.  Dispense: 180 tablet; Refill: 0 - glipiZIDE (GLUCOTROL) 5 MG tablet; Take 0.5 tablets (2.5 mg total) by mouth 2 (two) times daily before a meal.  Dispense: 90 tablet; Refill: 0  2. Old cerebrovascular accident (CVA) without late effect  Doing well   3. CKD (chronic kidney disease), stage III ([place])  We will consider  4. Primary cancer  of left upper lobe of lung ([place])  Doing well   5. Hypercholesteremia  - atorvastatin (LIPITOR) 40 MG tablet; Take 1  tablet (40 mg total) by mouth 3 (three) times a week.  Dispense: 48 tablet; Refill: 1

## 2019-02-03 ENCOUNTER — Other Ambulatory Visit: Payer: Self-pay

## 2019-02-06 ENCOUNTER — Inpatient Hospital Stay (HOSPITAL_BASED_OUTPATIENT_CLINIC_OR_DEPARTMENT_OTHER): Payer: Medicare Other | Admitting: Internal Medicine

## 2019-02-06 ENCOUNTER — Encounter: Payer: Self-pay | Admitting: Internal Medicine

## 2019-02-06 ENCOUNTER — Other Ambulatory Visit: Payer: Self-pay

## 2019-02-06 ENCOUNTER — Inpatient Hospital Stay: Payer: Medicare Other | Attending: Internal Medicine

## 2019-02-06 VITALS — BP 139/75 | HR 78 | Temp 97.3°F | Wt 173.5 lb

## 2019-02-06 DIAGNOSIS — R5383 Other fatigue: Secondary | ICD-10-CM | POA: Diagnosis not present

## 2019-02-06 DIAGNOSIS — I129 Hypertensive chronic kidney disease with stage 1 through stage 4 chronic kidney disease, or unspecified chronic kidney disease: Secondary | ICD-10-CM | POA: Diagnosis not present

## 2019-02-06 DIAGNOSIS — C3412 Malignant neoplasm of upper lobe, left bronchus or lung: Secondary | ICD-10-CM

## 2019-02-06 DIAGNOSIS — E785 Hyperlipidemia, unspecified: Secondary | ICD-10-CM

## 2019-02-06 DIAGNOSIS — E1122 Type 2 diabetes mellitus with diabetic chronic kidney disease: Secondary | ICD-10-CM

## 2019-02-06 DIAGNOSIS — R635 Abnormal weight gain: Secondary | ICD-10-CM | POA: Diagnosis not present

## 2019-02-06 DIAGNOSIS — Z7901 Long term (current) use of anticoagulants: Secondary | ICD-10-CM | POA: Diagnosis not present

## 2019-02-06 DIAGNOSIS — N183 Chronic kidney disease, stage 3 (moderate): Secondary | ICD-10-CM | POA: Diagnosis not present

## 2019-02-06 DIAGNOSIS — Z86718 Personal history of other venous thrombosis and embolism: Secondary | ICD-10-CM

## 2019-02-06 DIAGNOSIS — Z86711 Personal history of pulmonary embolism: Secondary | ICD-10-CM | POA: Diagnosis not present

## 2019-02-06 DIAGNOSIS — Z7984 Long term (current) use of oral hypoglycemic drugs: Secondary | ICD-10-CM | POA: Insufficient documentation

## 2019-02-06 DIAGNOSIS — R9721 Rising PSA following treatment for malignant neoplasm of prostate: Secondary | ICD-10-CM | POA: Diagnosis not present

## 2019-02-06 DIAGNOSIS — Z79899 Other long term (current) drug therapy: Secondary | ICD-10-CM

## 2019-02-06 DIAGNOSIS — Z8673 Personal history of transient ischemic attack (TIA), and cerebral infarction without residual deficits: Secondary | ICD-10-CM

## 2019-02-06 DIAGNOSIS — Z8249 Family history of ischemic heart disease and other diseases of the circulatory system: Secondary | ICD-10-CM | POA: Insufficient documentation

## 2019-02-06 LAB — COMPREHENSIVE METABOLIC PANEL
ALT: 48 U/L — ABNORMAL HIGH (ref 0–44)
AST: 26 U/L (ref 15–41)
Albumin: 3.3 g/dL — ABNORMAL LOW (ref 3.5–5.0)
Alkaline Phosphatase: 86 U/L (ref 38–126)
Anion gap: 10 (ref 5–15)
BUN: 18 mg/dL (ref 8–23)
CO2: 18 mmol/L — ABNORMAL LOW (ref 22–32)
Calcium: 8.6 mg/dL — ABNORMAL LOW (ref 8.9–10.3)
Chloride: 107 mmol/L (ref 98–111)
Creatinine, Ser: 1.4 mg/dL — ABNORMAL HIGH (ref 0.61–1.24)
GFR calc Af Amer: >60 mL/min (ref >60)
GFR calc non Af Amer: 52 mL/min — ABNORMAL LOW (ref >60)
Glucose, Bld: 191 mg/dL — ABNORMAL HIGH (ref 70–99)
Potassium: 4 mmol/L (ref 3.5–5.1)
Sodium: 135 mmol/L (ref 135–145)
Total Bilirubin: 0.5 mg/dL (ref 0.3–1.2)
Total Protein: 6.5 g/dL (ref 6.5–8.1)

## 2019-02-06 LAB — CBC WITH DIFFERENTIAL/PLATELET
Abs Immature Granulocytes: 0.01 10*3/uL (ref 0.00–0.07)
Basophils Absolute: 0 10*3/uL (ref 0.0–0.1)
Basophils Relative: 1 %
Eosinophils Absolute: 0.2 10*3/uL (ref 0.0–0.5)
Eosinophils Relative: 4 %
HCT: 41.9 % (ref 39.0–52.0)
Hemoglobin: 14 g/dL (ref 13.0–17.0)
Immature Granulocytes: 0 %
Lymphocytes Relative: 34 %
Lymphs Abs: 1.8 10*3/uL (ref 0.7–4.0)
MCH: 27.8 pg (ref 26.0–34.0)
MCHC: 33.4 g/dL (ref 30.0–36.0)
MCV: 83.1 fL (ref 80.0–100.0)
Monocytes Absolute: 0.7 10*3/uL (ref 0.1–1.0)
Monocytes Relative: 14 %
Neutro Abs: 2.5 10*3/uL (ref 1.7–7.7)
Neutrophils Relative %: 47 %
Platelets: 212 10*3/uL (ref 150–400)
RBC: 5.04 MIL/uL (ref 4.22–5.81)
RDW: 15.3 % (ref 11.5–15.5)
WBC: 5.3 10*3/uL (ref 4.0–10.5)
nRBC: 0 % (ref 0.0–0.2)

## 2019-02-06 LAB — PSA: Prostatic Specific Antigen: 6.74 ng/mL — ABNORMAL HIGH (ref 0.00–4.00)

## 2019-02-06 NOTE — Assessment & Plan Note (Addendum)
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  Stable.  # Continue crizotinib 250 mg twice a day; no clinical evidence of progression.   # CKD-stage III multifactorial [prostatitic obstruction/diabetes]- creatinine 1.4. stable.   # Prostate lesions- s/p MRI-discussed with Dr. Stoioff; holding of MRI biopsy.  Repeat PSA today pending.  # Bil LE swelling: CKD/crizotinib compression stockings; stable.   # Bilateral PE & left lower extremity DVT:  on Xarelto; stable.   # HTN-better controlled; 130s  Stable  # DISPOSITION: # follow up in 6 weeks-MD; labs-cbc/cmp/Dr.B 

## 2019-02-06 NOTE — Progress Notes (Signed)
[facility] OFFICE PROGRESS NOTE  Patient Care Team: [name], [name], MD as Medical Oncologist (Medical Oncology)  Cancer Staging No matching staging information was found for the patient.   Oncology History   # OCT 2017- ADENO CA LUNG; STAGE IV [; LUL; bil supraclavicular LN; Left neck LN Bx]; ROS-1 MUTATED; s/p Carbo-alimta x1[oct 2017]  # NOV 1st 2017- XALKORI 250 mg BID; JAN 15th CT- PR;  # AUG 27th Chemo-RT to persistent LUL/mediastinal LN [s/p carbo-taxol- with RT; finished Sep 22nd 2018]  # LLE DVT/bil PE/Multiple strokes [? On xarelto]-Lovenox; Jan mid 2018- xarelto [lovenox-insurance issues]  # MRI brain- multiple infarcts [2d echo/bubble study-NEG's/p Neurology eval]  ------------------------------------------------------------    # # s/p TURP [Sep 2017; Dr.Cope] DEC 26th CT- distended bladder   # MOLECULAR TESTING- ROS-1 POSITIVE; ALK/EGFR-NEG; PDL-1 EXPRESSION- 90%** [HIGH] -----------------------------------------------------------------    DIAGNOSIS: Adenocarcinoma the lung  ROS-1 +  STAGE:  IV       ;GOALS: Palliative  CURRENT/MOST RECENT THERAPY- CRIZOTINIB     Primary cancer of left upper lobe of lung ([place])      INTERVAL HISTORY:  Melvin Willis 68 y.o.  male pleasant patient above history of metastatic lung cancer-Ros: Positive currently on crizotinib is here for follow-up.  Denies any blood in stools or black her stools but denies any worsening shortness of breath or cough.  No visual changes.  Chronic mild fatigue.  Chronic mild swelling of the legs.  He is gaining weight.  No headaches.   Review of Systems  Constitutional: Positive for malaise/fatigue. Negative for chills, diaphoresis, fever and weight loss.  HENT: Negative for nosebleeds and sore throat.   Eyes: Negative for double vision.  Respiratory: Negative for cough, hemoptysis, sputum production, shortness  of breath and wheezing.   Cardiovascular: Positive for leg swelling. Negative for chest pain (Left upper chest wall pain chronic), palpitations and orthopnea.  Gastrointestinal: Negative for abdominal pain, blood in stool, constipation, diarrhea, heartburn, melena, nausea and vomiting.  Genitourinary: Negative for dysuria, frequency and urgency.  Musculoskeletal: Negative for back pain and joint pain.  Skin: Negative.  Negative for itching and rash.  Neurological: Negative for dizziness, tingling, focal weakness, weakness and headaches.  Endo/Heme/Allergies: Does not bruise/bleed easily.  Psychiatric/Behavioral: Negative for depression. The patient is not nervous/anxious and does not have insomnia.       PAST MEDICAL HISTORY :  Past Medical History:  Diagnosis Date  . Abnormal prostate specific antigen 08/08/2012  . Adiposity 04/16/2015  . Chronic kidney disease (CKD), stage III (moderate) ([place]) 11/27/2016  . CVA (cerebral vascular accident) ([place]) 06/17/2016  . Diabetes mellitus without complication ([place])   . Diverticulosis of sigmoid colon 04/16/2015  . Dyslipidemia 03/18/2015  . Hemorrhoids, internal 04/16/2015  . Hypercholesteremia 04/16/2015  . Hyperlipidemia   . Hypertension   . Primary cancer of left upper lobe of lung ([place])   . Pulmonary embolism ([place])   . Wears dentures    partial upper    PAST SURGICAL HISTORY :   Past Surgical History:  Procedure Laterality Date  . COLONOSCOPY    . COLONOSCOPY WITH PROPOFOL N/A 05/06/2015   Procedure: COLONOSCOPY WITH PROPOFOL;  Surgeon: Lucilla Lame, MD;  Location: [facility];  Service: Endoscopy;  Laterality: N/A;  ASCENDING COLON POLYPS X 2 TERMINAL ILEUM BIOPSY RANDOM COLON BX. TRANSVERSE COLON POLYP SIGMOID COLON POLYP  . ESOPHAGOGASTRODUODENOSCOPY (EGD) WITH PROPOFOL N/A 05/06/2015   Procedure: ESOPHAGOGASTRODUODENOSCOPY (  EGD) WITH PROPOFOL;  Surgeon: Lucilla Lame, MD;  Location: [facility];  Service: Endoscopy;   Laterality: N/A;  GASTRIC BIOPSY X1  . KIDNEY STONE SURGERY  2017    FAMILY HISTORY :   Family History  Problem Relation Age of Onset  . Diabetes Mother   . Diabetes Father   . CAD Father   . Dementia Father   . Diabetes Sister   . Cancer Maternal Uncle        Prostate  . Cancer Cousin        prostate    SOCIAL HISTORY:   Social History   Tobacco Use  . Smoking status: Never Smoker  . Smokeless tobacco: Never Used  Substance Use Topics  . Alcohol use: No    Alcohol/week: 0.0 standard drinks  . Drug use: No    ALLERGIES:  has No Known Allergies.  MEDICATIONS:  Current Outpatient Medications  Medication Sig Dispense Refill  . atorvastatin (LIPITOR) 40 MG tablet Take 1 tablet (40 mg total) by mouth 3 (three) times a week. 48 tablet 1  . crizotinib (XALKORI) 250 MG capsule Take 1 capsule (250 mg total) by mouth 2 (two) times daily. 60 capsule 3  . finasteride (PROSCAR) 5 MG tablet Take 1 tablet (5 mg total) by mouth daily. 90 tablet 3  . glipiZIDE (GLUCOTROL) 5 MG tablet Take 0.5 tablets (2.5 mg total) by mouth 2 (two) times daily before a meal. 90 tablet 0  . metFORMIN (GLUCOPHAGE) 500 MG tablet Take 1 tablet (500 mg total) by mouth 2 (two) times daily with a meal. 180 tablet 0  . rivaroxaban (XARELTO) 20 MG TABS tablet Take 1 tablet (20 mg total) by mouth daily with supper. 30 tablet 11  . tamsulosin (FLOMAX) 0.4 MG CAPS capsule Take 1 capsule (0.4 mg total) by mouth every morning. 90 capsule 3  . lisinopril (ZESTRIL) 5 MG tablet Take 1 tablet (5 mg total) by mouth daily. (Patient not taking: Reported on 02/06/2019) 90 tablet 1   No current facility-administered medications for this visit.     PHYSICAL EXAMINATION: ECOG PERFORMANCE STATUS: 0 - Asymptomatic  BP 139/75 (BP Location: Left Arm, Patient Position: Sitting)   Pulse 78   Temp (!) 97.3 F (36.3 C) (Tympanic)   Wt 173 lb 8 oz (78.7 kg)   SpO2 98%   BMI 29.78 kg/m   Filed Weights   02/06/19 1031  Weight:  173 lb 8 oz (78.7 kg)    Physical Exam  Constitutional: He is oriented to person, place, and time and well-developed, well-nourished, and in no distress.  Alone. He is walking by himself.  HENT:  Head: Normocephalic and atraumatic.  Mouth/Throat: Oropharynx is clear and moist. No oropharyngeal exudate.  Eyes: Pupils are equal, round, and reactive to light.  Neck: Normal range of motion. Neck supple.  Cardiovascular: Normal rate and regular rhythm.  Pulmonary/Chest: No respiratory distress. He has no wheezes.  Abdominal: Soft. Bowel sounds are normal. He exhibits no distension and no mass. There is no abdominal tenderness. There is no rebound and no guarding.  Musculoskeletal: Normal range of motion.        General: No tenderness or edema.  Neurological: He is alert and oriented to person, place, and time.  Skin: Skin is warm.  Psychiatric: Affect normal.       LABORATORY DATA:  I have reviewed the data as listed    Component Value Date/Time   NA 135 02/06/2019 1006   NA  139 01/01/2016 1000   K 4.0 02/06/2019 1006   CL 107 02/06/2019 1006   CO2 18 (L) 02/06/2019 1006   GLUCOSE 191 (H) 02/06/2019 1006   BUN 18 02/06/2019 1006   BUN 13 01/01/2016 1000   CREATININE 1.40 (H) 02/06/2019 1006   CREATININE 1.32 (H) 08/22/2018 1107   CALCIUM 8.6 (L) 02/06/2019 1006   PROT 6.5 02/06/2019 1006   PROT 7.2 01/01/2016 1000   ALBUMIN 3.3 (L) 02/06/2019 1006   ALBUMIN 4.4 01/01/2016 1000   AST 26 02/06/2019 1006   ALT 48 (H) 02/06/2019 1006   ALKPHOS 86 02/06/2019 1006   BILITOT 0.5 02/06/2019 1006   BILITOT 0.4 01/01/2016 1000   GFRNONAA 52 (L) 02/06/2019 1006   GFRNONAA 56 (L) 08/22/2018 1107   GFRAA >60 02/06/2019 1006   GFRAA 65 08/22/2018 1107    No results found for: SPEP, UPEP  Lab Results  Component Value Date   WBC 5.3 02/06/2019   NEUTROABS 2.5 02/06/2019   HGB 14.0 02/06/2019   HCT 41.9 02/06/2019   MCV 83.1 02/06/2019   PLT 212 02/06/2019      Chemistry       Component Value Date/Time   NA 135 02/06/2019 1006   NA 139 01/01/2016 1000   K 4.0 02/06/2019 1006   CL 107 02/06/2019 1006   CO2 18 (L) 02/06/2019 1006   BUN 18 02/06/2019 1006   BUN 13 01/01/2016 1000   CREATININE 1.40 (H) 02/06/2019 1006   CREATININE 1.32 (H) 08/22/2018 1107      Component Value Date/Time   CALCIUM 8.6 (L) 02/06/2019 1006   ALKPHOS 86 02/06/2019 1006   AST 26 02/06/2019 1006   ALT 48 (H) 02/06/2019 1006   BILITOT 0.5 02/06/2019 1006   BILITOT 0.4 01/01/2016 1000       RADIOGRAPHIC STUDIES: I have personally reviewed the radiological images as listed and agreed with the findings in the report. No results found.   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  Stable.  # Continue crizotinib 250 mg twice a day; no clinical evidence of progression.   # CKD-stage III multifactorial [prostatitic obstruction/diabetes]- creatinine 1.4. stable.   # Prostate lesions- s/p MRI-discussed with Dr. Bernardo Heater; holding of MRI biopsy.  Repeat PSA today pending.  # Bil LE swelling: CKD/crizotinib compression stockings; stable.   # Bilateral PE & left lower extremity DVT:  on Xarelto; stable.   # HTN-better controlled; 130s  Stable  # DISPOSITION: # follow up in 6 weeks-MD; labs-cbc/cmp/Dr.B   No orders of the defined types were placed in this encounter.  All questions were answered. The patient knows to call the clinic with any problems, questions or concerns.      Cammie Sickle, MD 02/06/2019 12:49 PM

## 2019-02-16 ENCOUNTER — Telehealth: Payer: Self-pay | Admitting: Urology

## 2019-02-16 NOTE — Telephone Encounter (Signed)
Did they ever decide if he needed the fusion BX or not?   [name]

## 2019-02-18 ENCOUNTER — Telehealth: Payer: Self-pay | Admitting: Internal Medicine

## 2019-02-18 NOTE — Telephone Encounter (Signed)
Patient's PSA is elevated 6.7;  Awaiting MR biopsy with Dr.Stoiff.  Heather- please inform pt to stop xarelto 3 days prior to his Biopsy; and start xarelto after biopsy at the recommendations of Dr.Stoiff. I have also communicated with Dr.Stoiff.   GB

## 2019-02-18 NOTE — Telephone Encounter (Signed)
x

## 2019-02-20 NOTE — Telephone Encounter (Signed)
Left vm for patient 1138 am today - to discuss pt's d/c xarelto

## 2019-02-21 NOTE — Telephone Encounter (Signed)
Spoke with patient. Instructions provided to patient. He stated that he does not know when the procedure will be scheduled. I informed him that his urology office would contact him with this information.

## 2019-02-21 NOTE — Telephone Encounter (Signed)
I will contact Melvin Willis from [facility] will contact the patient to schedule him.   [name]

## 2019-02-23 NOTE — Telephone Encounter (Signed)
New referral was sent to [facility] today and she is expecting it and will get him scheduled.   [name]

## 2019-03-17 ENCOUNTER — Other Ambulatory Visit: Payer: Self-pay

## 2019-03-17 ENCOUNTER — Other Ambulatory Visit: Payer: Self-pay | Admitting: *Deleted

## 2019-03-17 DIAGNOSIS — C3412 Malignant neoplasm of upper lobe, left bronchus or lung: Secondary | ICD-10-CM

## 2019-03-20 ENCOUNTER — Inpatient Hospital Stay (HOSPITAL_BASED_OUTPATIENT_CLINIC_OR_DEPARTMENT_OTHER): Payer: Medicare Other | Admitting: Internal Medicine

## 2019-03-20 ENCOUNTER — Inpatient Hospital Stay: Payer: Medicare Other | Attending: Internal Medicine

## 2019-03-20 ENCOUNTER — Other Ambulatory Visit: Payer: Self-pay | Admitting: *Deleted

## 2019-03-20 ENCOUNTER — Other Ambulatory Visit: Payer: Self-pay

## 2019-03-20 VITALS — BP 151/92 | HR 62 | Temp 97.2°F | Resp 16 | Wt 171.0 lb

## 2019-03-20 DIAGNOSIS — N183 Chronic kidney disease, stage 3 (moderate): Secondary | ICD-10-CM

## 2019-03-20 DIAGNOSIS — R600 Localized edema: Secondary | ICD-10-CM | POA: Insufficient documentation

## 2019-03-20 DIAGNOSIS — C3412 Malignant neoplasm of upper lobe, left bronchus or lung: Secondary | ICD-10-CM | POA: Insufficient documentation

## 2019-03-20 DIAGNOSIS — R5383 Other fatigue: Secondary | ICD-10-CM | POA: Diagnosis not present

## 2019-03-20 DIAGNOSIS — C7951 Secondary malignant neoplasm of bone: Secondary | ICD-10-CM | POA: Diagnosis not present

## 2019-03-20 DIAGNOSIS — I129 Hypertensive chronic kidney disease with stage 1 through stage 4 chronic kidney disease, or unspecified chronic kidney disease: Secondary | ICD-10-CM | POA: Diagnosis not present

## 2019-03-20 DIAGNOSIS — R51 Headache: Secondary | ICD-10-CM | POA: Insufficient documentation

## 2019-03-20 DIAGNOSIS — Z8249 Family history of ischemic heart disease and other diseases of the circulatory system: Secondary | ICD-10-CM | POA: Insufficient documentation

## 2019-03-20 DIAGNOSIS — M545 Low back pain: Secondary | ICD-10-CM

## 2019-03-20 DIAGNOSIS — E1122 Type 2 diabetes mellitus with diabetic chronic kidney disease: Secondary | ICD-10-CM | POA: Insufficient documentation

## 2019-03-20 DIAGNOSIS — M25551 Pain in right hip: Secondary | ICD-10-CM | POA: Diagnosis not present

## 2019-03-20 DIAGNOSIS — Z86718 Personal history of other venous thrombosis and embolism: Secondary | ICD-10-CM

## 2019-03-20 DIAGNOSIS — E785 Hyperlipidemia, unspecified: Secondary | ICD-10-CM | POA: Diagnosis not present

## 2019-03-20 DIAGNOSIS — Z79899 Other long term (current) drug therapy: Secondary | ICD-10-CM

## 2019-03-20 DIAGNOSIS — Z86711 Personal history of pulmonary embolism: Secondary | ICD-10-CM

## 2019-03-20 DIAGNOSIS — Z7901 Long term (current) use of anticoagulants: Secondary | ICD-10-CM | POA: Insufficient documentation

## 2019-03-20 DIAGNOSIS — Z8673 Personal history of transient ischemic attack (TIA), and cerebral infarction without residual deficits: Secondary | ICD-10-CM

## 2019-03-20 DIAGNOSIS — Z833 Family history of diabetes mellitus: Secondary | ICD-10-CM | POA: Diagnosis not present

## 2019-03-20 DIAGNOSIS — C7931 Secondary malignant neoplasm of brain: Secondary | ICD-10-CM | POA: Diagnosis not present

## 2019-03-20 DIAGNOSIS — Z7984 Long term (current) use of oral hypoglycemic drugs: Secondary | ICD-10-CM | POA: Insufficient documentation

## 2019-03-20 DIAGNOSIS — R112 Nausea with vomiting, unspecified: Secondary | ICD-10-CM | POA: Diagnosis not present

## 2019-03-20 DIAGNOSIS — R519 Headache, unspecified: Secondary | ICD-10-CM

## 2019-03-20 LAB — COMPREHENSIVE METABOLIC PANEL
ALT: 45 U/L — ABNORMAL HIGH (ref 0–44)
AST: 23 U/L (ref 15–41)
Albumin: 3.5 g/dL (ref 3.5–5.0)
Alkaline Phosphatase: 87 U/L (ref 38–126)
Anion gap: 7 (ref 5–15)
BUN: 20 mg/dL (ref 8–23)
CO2: 20 mmol/L — ABNORMAL LOW (ref 22–32)
Calcium: 8.8 mg/dL — ABNORMAL LOW (ref 8.9–10.3)
Chloride: 107 mmol/L (ref 98–111)
Creatinine, Ser: 1.42 mg/dL — ABNORMAL HIGH (ref 0.61–1.24)
GFR calc Af Amer: 60 mL/min — ABNORMAL LOW (ref >60)
GFR calc non Af Amer: 51 mL/min — ABNORMAL LOW (ref >60)
Glucose, Bld: 187 mg/dL — ABNORMAL HIGH (ref 70–99)
Potassium: 4 mmol/L (ref 3.5–5.1)
Sodium: 134 mmol/L — ABNORMAL LOW (ref 135–145)
Total Bilirubin: 0.6 mg/dL (ref 0.3–1.2)
Total Protein: 7.4 g/dL (ref 6.5–8.1)

## 2019-03-20 LAB — CBC WITH DIFFERENTIAL/PLATELET
Abs Immature Granulocytes: 0.01 10*3/uL (ref 0.00–0.07)
Basophils Absolute: 0 10*3/uL (ref 0.0–0.1)
Basophils Relative: 1 %
Eosinophils Absolute: 0.2 10*3/uL (ref 0.0–0.5)
Eosinophils Relative: 4 %
HCT: 44.7 % (ref 39.0–52.0)
Hemoglobin: 15 g/dL (ref 13.0–17.0)
Immature Granulocytes: 0 %
Lymphocytes Relative: 40 %
Lymphs Abs: 2.2 10*3/uL (ref 0.7–4.0)
MCH: 28.1 pg (ref 26.0–34.0)
MCHC: 33.6 g/dL (ref 30.0–36.0)
MCV: 83.7 fL (ref 80.0–100.0)
Monocytes Absolute: 0.7 10*3/uL (ref 0.1–1.0)
Monocytes Relative: 13 %
Neutro Abs: 2.3 10*3/uL (ref 1.7–7.7)
Neutrophils Relative %: 42 %
Platelets: 224 10*3/uL (ref 150–400)
RBC: 5.34 MIL/uL (ref 4.22–5.81)
RDW: 14.8 % (ref 11.5–15.5)
WBC: 5.5 10*3/uL (ref 4.0–10.5)
nRBC: 0 % (ref 0.0–0.2)

## 2019-03-20 NOTE — Progress Notes (Signed)
[facility] OFFICE PROGRESS NOTE  Patient Care Team: [name], [name], MD as Medical Oncologist (Medical Oncology)  Cancer Staging No matching staging information was found for the patient.   Oncology History Overview Note  # OCT 2017- ADENO CA LUNG; STAGE IV [; LUL; bil supraclavicular LN; Left neck LN Bx]; ROS-1 MUTATED; s/p Carbo-alimta x1[oct 2017]  # NOV 1st 2017- XALKORI 250 mg BID; JAN 15th CT- PR;  # AUG 27th Chemo-RT to persistent LUL/mediastinal LN [s/p carbo-taxol- with RT; finished Sep 22nd 2018]  # LLE DVT/bil PE/Multiple strokes [? On xarelto]-Lovenox; Jan mid 2018- xarelto [lovenox-insurance issues]  # MRI brain- multiple infarcts [2d echo/bubble study-NEG's/p Neurology eval]  ------------------------------------------------------------    # # s/p TURP [Sep 2017; Dr.Cope] DEC 26th CT- distended bladder   # MOLECULAR TESTING- ROS-1 POSITIVE; ALK/EGFR-NEG; PDL-1 EXPRESSION- 90%** [HIGH] -----------------------------------------------------------------    DIAGNOSIS: Adenocarcinoma the lung  ROS-1 +  STAGE:  IV       ;GOALS: Palliative  CURRENT/MOST RECENT THERAPY- CRIZOTINIB   Primary cancer of left upper lobe of lung ([name])      INTERVAL HISTORY:  Melvin Willis 68 y.o.  male pleasant patient above history of metastatic lung cancer-Ros: Positive currently on crizotinib is here for follow-up.  Patient is still awaiting to hear from scheduling for his prostate biopsy.  Patient noted to have pain in his mid lower back and his right hip in the last 2 weeks or so.  No radiation.  6 on a scale of 10.  Improved with walking.  Worse at rest.  Complains of worsening nausea over the last few weeks.  Notes to have headaches and episode of nausea vomiting.   Review of Systems  Constitutional: Positive for malaise/fatigue. Negative for chills, diaphoresis, fever and weight loss.  HENT:  Negative for nosebleeds and sore throat.   Eyes: Negative for double vision.  Respiratory: Negative for cough, hemoptysis, sputum production, shortness of breath and wheezing.   Cardiovascular: Positive for leg swelling. Negative for chest pain (Left upper chest wall pain chronic), palpitations and orthopnea.  Gastrointestinal: Positive for nausea and vomiting. Negative for abdominal pain, blood in stool, constipation, diarrhea, heartburn and melena.  Genitourinary: Negative for dysuria, frequency and urgency.  Musculoskeletal: Negative for back pain and joint pain.  Skin: Negative.  Negative for itching and rash.  Neurological: Positive for headaches. Negative for dizziness, tingling, focal weakness and weakness.  Endo/Heme/Allergies: Does not bruise/bleed easily.  Psychiatric/Behavioral: Negative for depression. The patient is not nervous/anxious and does not have insomnia.       PAST MEDICAL HISTORY :  Past Medical History:  Diagnosis Date  . Abnormal prostate specific antigen 08/08/2012  . Adiposity 04/16/2015  . Chronic kidney disease (CKD), stage III (moderate) ([place]) 11/27/2016  . CVA (cerebral vascular accident) ([place]) 06/17/2016  . Diabetes mellitus without complication ([place])   . Diverticulosis of sigmoid colon 04/16/2015  . Dyslipidemia 03/18/2015  . Hemorrhoids, internal 04/16/2015  . Hypercholesteremia 04/16/2015  . Hyperlipidemia   . Hypertension   . Primary cancer of left upper lobe of lung ([place])   . Pulmonary embolism ([place])   . Wears dentures    partial upper    PAST SURGICAL HISTORY :   Past Surgical History:  Procedure Laterality Date  . COLONOSCOPY    . COLONOSCOPY WITH PROPOFOL N/A 05/06/2015   Procedure: COLONOSCOPY WITH PROPOFOL;  Surgeon: Lucilla Lame, MD;  Location: [facility];  Service: Endoscopy;  Laterality: N/A;  ASCENDING COLON POLYPS X 2 TERMINAL ILEUM BIOPSY RANDOM COLON BX. TRANSVERSE COLON POLYP SIGMOID COLON POLYP  . ESOPHAGOGASTRODUODENOSCOPY  (EGD) WITH PROPOFOL N/A 05/06/2015   Procedure: ESOPHAGOGASTRODUODENOSCOPY (EGD) WITH PROPOFOL;  Surgeon: Lucilla Lame, MD;  Location: [facility];  Service: Endoscopy;  Laterality: N/A;  GASTRIC BIOPSY X1  . KIDNEY STONE SURGERY  2017    FAMILY HISTORY :   Family History  Problem Relation Age of Onset  . Diabetes Mother   . Diabetes Father   . CAD Father   . Dementia Father   . Diabetes Sister   . Cancer Maternal Uncle        Prostate  . Cancer Cousin        prostate    SOCIAL HISTORY:   Social History   Tobacco Use  . Smoking status: Never Smoker  . Smokeless tobacco: Never Used  Substance Use Topics  . Alcohol use: No    Alcohol/week: 0.0 standard drinks  . Drug use: No    ALLERGIES:  has No Known Allergies.  MEDICATIONS:  Current Outpatient Medications  Medication Sig Dispense Refill  . atorvastatin (LIPITOR) 40 MG tablet Take 1 tablet (40 mg total) by mouth 3 (three) times a week. 48 tablet 1  . crizotinib (XALKORI) 250 MG capsule Take 1 capsule (250 mg total) by mouth 2 (two) times daily. 60 capsule 3  . finasteride (PROSCAR) 5 MG tablet Take 1 tablet (5 mg total) by mouth daily. 90 tablet 3  . glipiZIDE (GLUCOTROL) 5 MG tablet Take 0.5 tablets (2.5 mg total) by mouth 2 (two) times daily before a meal. 90 tablet 0  . lisinopril (ZESTRIL) 5 MG tablet Take 1 tablet (5 mg total) by mouth daily. 90 tablet 1  . metFORMIN (GLUCOPHAGE) 500 MG tablet Take 1 tablet (500 mg total) by mouth 2 (two) times daily with a meal. 180 tablet 0  . rivaroxaban (XARELTO) 20 MG TABS tablet Take 1 tablet (20 mg total) by mouth daily with supper. 30 tablet 11  . tamsulosin (FLOMAX) 0.4 MG CAPS capsule Take 1 capsule (0.4 mg total) by mouth every morning. 90 capsule 3   No current facility-administered medications for this visit.     PHYSICAL EXAMINATION: ECOG PERFORMANCE STATUS: 0 - Asymptomatic  BP (!) 151/92   Pulse 62   Temp (!) 97.2 F (36.2 C)   Resp 16   Wt 171 lb  (77.6 kg)   BMI 29.35 kg/m   Filed Weights   03/20/19 1054  Weight: 171 lb (77.6 kg)    Physical Exam  Constitutional: He is oriented to person, place, and time and well-developed, well-nourished, and in no distress.  Alone. He is walking by himself.  HENT:  Head: Normocephalic and atraumatic.  Mouth/Throat: Oropharynx is clear and moist. No oropharyngeal exudate.  Eyes: Pupils are equal, round, and reactive to light.  Neck: Normal range of motion. Neck supple.  Cardiovascular: Normal rate and regular rhythm.  Pulmonary/Chest: No respiratory distress. He has no wheezes.  Abdominal: Soft. Bowel sounds are normal. He exhibits no distension and no mass. There is no abdominal tenderness. There is no rebound and no guarding.  Musculoskeletal: Normal range of motion.        General: No tenderness or edema.  Neurological: He is alert and oriented to person, place, and time.  Skin: Skin is warm.  Psychiatric: Affect normal.       LABORATORY DATA:  I have reviewed the  data as listed    Component Value Date/Time   NA 134 (L) 03/20/2019 1030   NA 139 01/01/2016 1000   K 4.0 03/20/2019 1030   CL 107 03/20/2019 1030   CO2 20 (L) 03/20/2019 1030   GLUCOSE 187 (H) 03/20/2019 1030   BUN 20 03/20/2019 1030   BUN 13 01/01/2016 1000   CREATININE 1.42 (H) 03/20/2019 1030   CREATININE 1.32 (H) 08/22/2018 1107   CALCIUM 8.8 (L) 03/20/2019 1030   PROT 7.4 03/20/2019 1030   PROT 7.2 01/01/2016 1000   ALBUMIN 3.5 03/20/2019 1030   ALBUMIN 4.4 01/01/2016 1000   AST 23 03/20/2019 1030   ALT 45 (H) 03/20/2019 1030   ALKPHOS 87 03/20/2019 1030   BILITOT 0.6 03/20/2019 1030   BILITOT 0.4 01/01/2016 1000   GFRNONAA 51 (L) 03/20/2019 1030   GFRNONAA 56 (L) 08/22/2018 1107   GFRAA 60 (L) 03/20/2019 1030   GFRAA 65 08/22/2018 1107    No results found for: SPEP, UPEP  Lab Results  Component Value Date   WBC 5.5 03/20/2019   NEUTROABS 2.3 03/20/2019   HGB 15.0 03/20/2019   HCT 44.7  03/20/2019   MCV 83.7 03/20/2019   PLT 224 03/20/2019      Chemistry      Component Value Date/Time   NA 134 (L) 03/20/2019 1030   NA 139 01/01/2016 1000   K 4.0 03/20/2019 1030   CL 107 03/20/2019 1030   CO2 20 (L) 03/20/2019 1030   BUN 20 03/20/2019 1030   BUN 13 01/01/2016 1000   CREATININE 1.42 (H) 03/20/2019 1030   CREATININE 1.32 (H) 08/22/2018 1107      Component Value Date/Time   CALCIUM 8.8 (L) 03/20/2019 1030   ALKPHOS 87 03/20/2019 1030   AST 23 03/20/2019 1030   ALT 45 (H) 03/20/2019 1030   BILITOT 0.6 03/20/2019 1030   BILITOT 0.4 01/01/2016 1000       RADIOGRAPHIC STUDIES: I have personally reviewed the radiological images as listed and agreed with the findings in the report. No results found.   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  Stable; except for worsening back pain/headache- see below.   # Continue crizotinib 250 mg twice a day; continue crizotinib.  #Worsening headaches/nausea vomiting question brain metastases especially with crizotinib has limited penetration of the blood-brain barrier.  Recommend a stat MRI brain.  # New back pain/right hip pain-we will get a bone scan to rule out any metastasis.  Okay trying to take Tylenol.  # CKD-stage III multifactorial [prostatitic obstruction/diabetes]- creatinine 1.4. stable.   # Prostate lesions/PSA 6.3-- s/p MRI-discussed with Dr. Bernardo Heater; awaiting;  MRI biopsy. HOLD xarelto 4 days prior to Biopsy/ and start back 2 days later if no bleeding.   # Bil LE swelling: CKD/crizotinib compression stockings;  Stable.   # Bilateral PE & left lower extremity DVT:  on Xarelto; stable.   # HTN-better controlled; 150s/ stable.   # DISPOSITION: # Bone scan 1 week # MRI of brain STAT # follow up in 1 months/labs- cbc/cmp  Dr.B   Orders Placed This Encounter  Procedures  . NM Bone Scan Whole Body    Standing Status:   Future    Standing Expiration Date:   03/19/2020    Order Specific Question:   ** REASON FOR EXAM (  FREE TEXT)    Answer:   back pain/right hip pain    Order Specific Question:   If indicated for the ordered procedure, I authorize the administration of a radiopharmaceutical per Radiology protocol    Answer:   Yes    Order Specific Question:   Preferred imaging location?    Answer:   [facility]    Order Specific Question:   Radiology Contrast Protocol - do NOT remove file path    Answer:   \\[email or web address]  . MR Brain W Wo Contrast    Hold and Call     Standing Status:   Future    Standing Expiration Date:   03/19/2020    Order Specific Question:   ** REASON FOR EXAM (FREE TEXT)    Answer:   headaches; nausea; vomitting; Lung cancer with mets    Order Specific Question:   If indicated for the ordered procedure, I authorize the administration of contrast media per Radiology protocol    Answer:   Yes    Order Specific Question:   What is the patient's sedation requirement?    Answer:   No Sedation    Order Specific Question:   Does the patient have a pacemaker or implanted devices?    Answer:   No    Order Specific Question:   Use SRS Protocol?    Answer:   No    Order Specific Question:   Call Results- Best Contact Number?    Answer:   [phone]    Order Specific Question:   Radiology Contrast Protocol - do NOT remove file path    Answer:   \\[email or web address]    Order Specific Question:   Preferred imaging location?    Answer:   [facility] (table limit-400lbs)   All questions were answered. The patient knows to call the clinic with any problems, questions or concerns.      Cammie Sickle, MD 03/20/2019 11:25 AM

## 2019-03-20 NOTE — Assessment & Plan Note (Addendum)
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  Stable; except for worsening back pain/headache- see below.   # Continue crizotinib 250 mg twice a day; continue crizotinib.  #Worsening headaches/nausea vomiting question brain metastases especially with crizotinib has limited penetration of the blood-brain barrier.  Recommend a stat MRI brain.  # New back pain/right hip pain-we will get a bone scan to rule out any metastasis.  Okay trying to take Tylenol.  # CKD-stage III multifactorial [prostatitic obstruction/diabetes]- creatinine 1.4. stable.   # Prostate lesions/PSA 6.3-- s/p MRI-discussed with Dr. Bernardo Heater; awaiting;  MRI biopsy. HOLD xarelto 4 days prior to Biopsy/ and start back 2 days later if no bleeding.   # Bil LE swelling: CKD/crizotinib compression stockings;  Stable.   # Bilateral PE & left lower extremity DVT:  on Xarelto; stable.   # HTN-better controlled; 150s/ stable.   # DISPOSITION: # Bone scan 1 week # MRI of brain STAT # follow up in 1 months/labs- cbc/cmp Dr.B

## 2019-03-20 NOTE — Progress Notes (Signed)
Patient has new low back pain when sitting or standing from sitting for 3-4 weeks.  Has not had prostate biopsy discussed at last office visit.  Does have occasional nausea from Crizotinib and doesn't have any meds at home if needed.  Did have compazine in 2017 which was helpful with nausea.    BP is elevated today 168/80, 151/92, 168/80 does have Lisinopril but does not take it regularly due to occasional dizziness.  Has only taken 1 time this week.

## 2019-03-21 ENCOUNTER — Ambulatory Visit
Admission: RE | Admit: 2019-03-21 | Discharge: 2019-03-21 | Disposition: A | Discharge status: Home / Self Care | Payer: Medicare Other | Source: Ambulatory Visit | Attending: Internal Medicine | Admitting: Internal Medicine

## 2019-03-21 ENCOUNTER — Telehealth: Payer: Self-pay | Admitting: Internal Medicine

## 2019-03-21 DIAGNOSIS — R51 Headache: Secondary | ICD-10-CM | POA: Insufficient documentation

## 2019-03-21 DIAGNOSIS — Z85118 Personal history of other malignant neoplasm of bronchus and lung: Secondary | ICD-10-CM | POA: Diagnosis not present

## 2019-03-21 DIAGNOSIS — C7931 Secondary malignant neoplasm of brain: Secondary | ICD-10-CM | POA: Diagnosis not present

## 2019-03-21 DIAGNOSIS — C3412 Malignant neoplasm of upper lobe, left bronchus or lung: Secondary | ICD-10-CM | POA: Diagnosis not present

## 2019-03-21 DIAGNOSIS — R519 Headache, unspecified: Secondary | ICD-10-CM

## 2019-03-21 MED ORDER — GADOBUTROL 1 MMOL/ML IV SOLN: 7.0000 mL | Freq: Once | INTRAVENOUS | Status: DC | PRN

## 2019-03-21 MED ORDER — GADOBUTROL 1 MMOL/ML IV SOLN
8.0000 mL | Freq: Once | INTRAVENOUS | Status: AC | PRN
  Administered 2019-03-21: 8 mL via INTRAVENOUS

## 2019-03-21 NOTE — Telephone Encounter (Signed)
Received a call from radiology-MRI brain consistent with metastatic.   Please have the patient follow-up with me tomorrow/July 15 th at 1:00 to discuss treatment options.   Will speak to Dr.Crystal for RT evaluation.   GB

## 2019-03-22 ENCOUNTER — Encounter: Payer: Self-pay | Admitting: Internal Medicine

## 2019-03-22 ENCOUNTER — Telehealth: Payer: Self-pay | Admitting: Pharmacy Technician

## 2019-03-22 ENCOUNTER — Telehealth: Payer: Self-pay | Admitting: Pharmacist

## 2019-03-22 ENCOUNTER — Inpatient Hospital Stay (HOSPITAL_BASED_OUTPATIENT_CLINIC_OR_DEPARTMENT_OTHER): Payer: Medicare Other | Admitting: Internal Medicine

## 2019-03-22 ENCOUNTER — Other Ambulatory Visit: Payer: Self-pay

## 2019-03-22 DIAGNOSIS — E785 Hyperlipidemia, unspecified: Secondary | ICD-10-CM | POA: Diagnosis not present

## 2019-03-22 DIAGNOSIS — Z8249 Family history of ischemic heart disease and other diseases of the circulatory system: Secondary | ICD-10-CM

## 2019-03-22 DIAGNOSIS — Z86711 Personal history of pulmonary embolism: Secondary | ICD-10-CM

## 2019-03-22 DIAGNOSIS — C3412 Malignant neoplasm of upper lobe, left bronchus or lung: Secondary | ICD-10-CM

## 2019-03-22 DIAGNOSIS — E1122 Type 2 diabetes mellitus with diabetic chronic kidney disease: Secondary | ICD-10-CM

## 2019-03-22 DIAGNOSIS — Z86718 Personal history of other venous thrombosis and embolism: Secondary | ICD-10-CM

## 2019-03-22 DIAGNOSIS — C7931 Secondary malignant neoplasm of brain: Secondary | ICD-10-CM

## 2019-03-22 DIAGNOSIS — R112 Nausea with vomiting, unspecified: Secondary | ICD-10-CM | POA: Diagnosis not present

## 2019-03-22 DIAGNOSIS — N183 Chronic kidney disease, stage 3 (moderate): Secondary | ICD-10-CM | POA: Diagnosis not present

## 2019-03-22 DIAGNOSIS — R600 Localized edema: Secondary | ICD-10-CM

## 2019-03-22 DIAGNOSIS — R5383 Other fatigue: Secondary | ICD-10-CM | POA: Diagnosis not present

## 2019-03-22 DIAGNOSIS — Z833 Family history of diabetes mellitus: Secondary | ICD-10-CM

## 2019-03-22 DIAGNOSIS — I129 Hypertensive chronic kidney disease with stage 1 through stage 4 chronic kidney disease, or unspecified chronic kidney disease: Secondary | ICD-10-CM

## 2019-03-22 DIAGNOSIS — Z8673 Personal history of transient ischemic attack (TIA), and cerebral infarction without residual deficits: Secondary | ICD-10-CM

## 2019-03-22 DIAGNOSIS — Z7901 Long term (current) use of anticoagulants: Secondary | ICD-10-CM

## 2019-03-22 DIAGNOSIS — R51 Headache: Secondary | ICD-10-CM | POA: Diagnosis not present

## 2019-03-22 DIAGNOSIS — M25551 Pain in right hip: Secondary | ICD-10-CM | POA: Diagnosis not present

## 2019-03-22 DIAGNOSIS — Z79899 Other long term (current) drug therapy: Secondary | ICD-10-CM

## 2019-03-22 DIAGNOSIS — M545 Low back pain: Secondary | ICD-10-CM

## 2019-03-22 DIAGNOSIS — Z7984 Long term (current) use of oral hypoglycemic drugs: Secondary | ICD-10-CM

## 2019-03-22 DIAGNOSIS — C7951 Secondary malignant neoplasm of bone: Secondary | ICD-10-CM | POA: Diagnosis not present

## 2019-03-22 MED ORDER — ENTRECTINIB 200 MG PO CAPS: 600.0000 mg | ORAL_CAPSULE | Freq: Every day | ORAL | 6 refills | Status: DC

## 2019-03-22 MED ORDER — ONDANSETRON HCL 8 MG PO TABS: ORAL_TABLET | ORAL | 1 refills | Status: DC

## 2019-03-22 NOTE — Assessment & Plan Note (Addendum)
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  Currently on crizotinib.  # Multiple brain mets-symptomatic-with nausea.  Discussed the option of using next-generation Ros-1 inhibitor /N-TRK inhibitor- Entrectinib 600mg/day-this is approximately 50 to 60% intracranial responses.  Recommend Zofran as needed.  Also discussed option of whole brain radiation given the multiple lesions again reviewed the potential side effects of radiation including but not limited to fatigue memory deficits /cognitive deficits.  Patient seen development with radiation he states that he will discuss with his family.  Discussed with Dr. Crystal.  # New back pain/right hip pain-we will get a bone scan to rule out any metastasis.  Stable.  Await bone scan.  # CKD-stage III multifactorial [prostatitic obstruction/diabetes]- creatinine 1.4. stable.   # Prostate lesions/PSA 6.3-- s/p MRI-discussed with Dr. Stoioff; awaiting;  MRI biopsy-however this needs to be reassessed in the context of his new brain lesions.  # Bil LE swelling: CKD/crizotinib compression stockings; stable  # Bilateral PE & left lower extremity DVT:  on Xarelto; stable  # HTN-better controlled; 150s/stable  # I offered to talk to his wife twice; he declines.   # DISPOSITION: # follow up in 2 weeks/No labs- dr.B  # I reviewed the blood work- with the patient in detail; also reviewed the imaging independently [as summarized above]; and with the patient in detail.   # 25 minutes face-to-face with the patient discussing the above plan of care; more than 50% of time spent on prognosis/ natural history; counseling and coordination.  

## 2019-03-22 NOTE — Progress Notes (Signed)
[place] OFFICE PROGRESS NOTE  Patient Care Team: [name], [name], MD as Medical Oncologist (Medical Oncology)  Cancer Staging No matching staging information was found for the patient.   Oncology History Overview Note  # OCT 2017- ADENO CA LUNG; STAGE IV [; LUL; bil supraclavicular LN; Left neck LN Bx]; ROS-1 MUTATED; s/p Carbo-alimta x1[oct 2017]  # NOV 1st 2017- XALKORI 250 mg BID; JAN 15th CT- PR;  # AUG 27th Chemo-RT to persistent LUL/mediastinal LN [s/p carbo-taxol- with RT; finished Sep 22nd 2018];   # July 14th 2020- multiple metastatic lesions of the brain; awaiting Entrectinib.   # LLE DVT/bil PE/Multiple strokes [? 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  male pleasant patient above history of metastatic lung cancer-Ros: Positive currently on crizotinib is here for follow-up/review results of the MRI brain.  Patient continues to have nausea; with intermittent vomiting.  Denies any worsening headaches.  He continues to have back pain.  Not significantly worse.  He is awaiting a bone scan next week.   Review of Systems  Constitutional: Positive for malaise/fatigue. Negative for chills, diaphoresis, fever and weight loss.  HENT: Negative for nosebleeds and sore throat.   Eyes:  Negative for double vision.  Respiratory: Negative for cough, hemoptysis, sputum production, shortness of breath and wheezing.   Cardiovascular: Positive for leg swelling. Negative for chest pain (Left upper chest wall pain chronic), palpitations and orthopnea.  Gastrointestinal: Positive for nausea and vomiting. Negative for abdominal pain, blood in stool, constipation, diarrhea, heartburn and melena.  Genitourinary: Negative for dysuria, frequency and urgency.  Musculoskeletal: Negative for back pain and joint pain.  Skin: Negative.  Negative for itching and rash.  Neurological: Negative for dizziness, tingling, focal weakness and weakness.  Endo/Heme/Allergies: Does not bruise/bleed easily.  Psychiatric/Behavioral: Negative for depression. The patient is not nervous/anxious and does not have insomnia.       PAST MEDICAL HISTORY :  Past Medical History:  Diagnosis Date  . Abnormal prostate specific antigen 08/08/2012  . Adiposity 04/16/2015  . Chronic kidney disease (CKD), stage III (moderate) ([place]) 11/27/2016  . CVA (cerebral vascular accident) ([place]) 06/17/2016  . Diabetes mellitus without complication ([place])   . Diverticulosis of sigmoid colon 04/16/2015  . Dyslipidemia 03/18/2015  . Hemorrhoids, internal 04/16/2015  . Hypercholesteremia 04/16/2015  . Hyperlipidemia   . Hypertension   . Primary cancer of left upper lobe of lung ([place])   . Pulmonary embolism ([place])   . Wears dentures    partial upper    PAST SURGICAL HISTORY :   Past Surgical History:  Procedure Laterality Date  . COLONOSCOPY    . COLONOSCOPY WITH PROPOFOL N/A 05/06/2015   Procedure: COLONOSCOPY WITH PROPOFOL;  Surgeon: Lucilla Lame, MD;  Location: [facility];  Service: Endoscopy;  Laterality: N/A;  ASCENDING COLON POLYPS X 2 TERMINAL ILEUM BIOPSY RANDOM COLON BX. TRANSVERSE COLON POLYP SIGMOID COLON POLYP  .  ESOPHAGOGASTRODUODENOSCOPY (EGD) WITH PROPOFOL N/A 05/06/2015   Procedure: ESOPHAGOGASTRODUODENOSCOPY  (EGD) WITH PROPOFOL;  Surgeon: Lucilla Lame, MD;  Location: [facility];  Service: Endoscopy;  Laterality: N/A;  GASTRIC BIOPSY X1  . KIDNEY STONE SURGERY  2017    FAMILY HISTORY :   Family History  Problem Relation Age of Onset  . Diabetes Mother   . Diabetes Father   . CAD Father   . Dementia Father   . Diabetes Sister   . Cancer Maternal Uncle        Prostate  . Cancer Cousin        prostate    SOCIAL HISTORY:   Social History   Tobacco Use  . Smoking status: Never Smoker  . Smokeless tobacco: Never Used  Substance Use Topics  . Alcohol use: No    Alcohol/week: 0.0 standard drinks  . Drug use: No    ALLERGIES:  has No Known Allergies.  MEDICATIONS:  Current Outpatient Medications  Medication Sig Dispense Refill  . atorvastatin (LIPITOR) 40 MG tablet Take 1 tablet (40 mg total) by mouth 3 (three) times a week. 48 tablet 1  . crizotinib (XALKORI) 250 MG capsule Take 1 capsule (250 mg total) by mouth 2 (two) times daily. 60 capsule 3  . finasteride (PROSCAR) 5 MG tablet Take 1 tablet (5 mg total) by mouth daily. 90 tablet 3  . glipiZIDE (GLUCOTROL) 5 MG tablet Take 0.5 tablets (2.5 mg total) by mouth 2 (two) times daily before a meal. 90 tablet 0  . lisinopril (ZESTRIL) 5 MG tablet Take 1 tablet (5 mg total) by mouth daily. 90 tablet 1  . metFORMIN (GLUCOPHAGE) 500 MG tablet Take 1 tablet (500 mg total) by mouth 2 (two) times daily with a meal. 180 tablet 0  . rivaroxaban (XARELTO) 20 MG TABS tablet Take 1 tablet (20 mg total) by mouth daily with supper. 30 tablet 11  . tamsulosin (FLOMAX) 0.4 MG CAPS capsule Take 1 capsule (0.4 mg total) by mouth every morning. 90 capsule 3  . entrectinib (ROZLYTREK) 200 MG capsule Take 3 capsules (600 mg total) by mouth daily. 90 capsule 6  . ondansetron (ZOFRAN) 8 MG tablet One pill every 8 hours as needed for nausea/vomitting. 40 tablet 1   No current facility-administered medications for this visit.     PHYSICAL  EXAMINATION: ECOG PERFORMANCE STATUS: 0 - Asymptomatic  BP (!) 176/70 (BP Location: Left Arm, Patient Position: Sitting)   Pulse 99   Temp 99.6 F (37.6 C) (Tympanic)   Resp 18   Wt 170 lb (77.1 kg)   BMI 29.18 kg/m   Filed Weights   03/22/19 1312  Weight: 170 lb (77.1 kg)    Physical Exam  Constitutional: He is oriented to person, place, and time and well-developed, well-nourished, and in no distress.  Alone. He is walking by himself.  HENT:  Head: Normocephalic and atraumatic.  Mouth/Throat: Oropharynx is clear and moist. No oropharyngeal exudate.  Eyes: Pupils are equal, round, and reactive to light.  Neck: Normal range of motion. Neck supple.  Cardiovascular: Normal rate and regular rhythm.  Pulmonary/Chest: No respiratory distress. He has no wheezes.  Abdominal: Soft. Bowel sounds are normal. He exhibits no distension and no mass. There is no abdominal tenderness. There is no rebound and no guarding.  Musculoskeletal: Normal range of motion.        General: No tenderness or edema.  Neurological: He is alert and oriented to person, place, and time.  Skin:  Skin is warm.  Psychiatric: Affect normal.       LABORATORY DATA:  I have reviewed the data as listed    Component Value Date/Time   NA 134 (L) 03/20/2019 1030   NA 139 01/01/2016 1000   K 4.0 03/20/2019 1030   CL 107 03/20/2019 1030   CO2 20 (L) 03/20/2019 1030   GLUCOSE 187 (H) 03/20/2019 1030   BUN 20 03/20/2019 1030   BUN 13 01/01/2016 1000   CREATININE 1.42 (H) 03/20/2019 1030   CREATININE 1.32 (H) 08/22/2018 1107   CALCIUM 8.8 (L) 03/20/2019 1030   PROT 7.4 03/20/2019 1030   PROT 7.2 01/01/2016 1000   ALBUMIN 3.5 03/20/2019 1030   ALBUMIN 4.4 01/01/2016 1000   AST 23 03/20/2019 1030   ALT 45 (H) 03/20/2019 1030   ALKPHOS 87 03/20/2019 1030   BILITOT 0.6 03/20/2019 1030   BILITOT 0.4 01/01/2016 1000   GFRNONAA 51 (L) 03/20/2019 1030   GFRNONAA 56 (L) 08/22/2018 1107   GFRAA 60 (L) 03/20/2019  1030   GFRAA 65 08/22/2018 1107    No results found for: SPEP, UPEP  Lab Results  Component Value Date   WBC 5.5 03/20/2019   NEUTROABS 2.3 03/20/2019   HGB 15.0 03/20/2019   HCT 44.7 03/20/2019   MCV 83.7 03/20/2019   PLT 224 03/20/2019      Chemistry      Component Value Date/Time   NA 134 (L) 03/20/2019 1030   NA 139 01/01/2016 1000   K 4.0 03/20/2019 1030   CL 107 03/20/2019 1030   CO2 20 (L) 03/20/2019 1030   BUN 20 03/20/2019 1030   BUN 13 01/01/2016 1000   CREATININE 1.42 (H) 03/20/2019 1030   CREATININE 1.32 (H) 08/22/2018 1107      Component Value Date/Time   CALCIUM 8.8 (L) 03/20/2019 1030   ALKPHOS 87 03/20/2019 1030   AST 23 03/20/2019 1030   ALT 45 (H) 03/20/2019 1030   BILITOT 0.6 03/20/2019 1030   BILITOT 0.4 01/01/2016 1000       RADIOGRAPHIC STUDIES: I have personally reviewed the radiological images as listed and agreed with the findings in the report. Mr Jeri Cos Wo Contrast  Result Date: 03/21/2019 CLINICAL DATA:  History of metastatic lung cancer. Headache and nausea. Rule out metastatic disease. EXAM: MRI HEAD WITHOUT AND WITH CONTRAST TECHNIQUE: Multiplanar, multiecho pulse sequences of the brain and surrounding structures were obtained without and with intravenous contrast. CONTRAST:  7 mL Gadovist IV COMPARISON:  MRI head 09/19/2016 FINDINGS: Brain: Multiple enhancing lesions in the brain have developed since the prior study compatible with metastatic disease. 16 mm left frontal lobe lesion without significant edema 8.5 mm left lateral temporal lesion Enhancing mass lesion with ill-defined margins left temporal lobe approximately 15 mm without significant edema. 9 x 10 mm enhancing lesion in the midbrain in the region of the interpeduncular cistern. Small enhancing lesions right posterior temporal white matter Enhancing lesion in the left internal capsule, 3 mm Multiple small 2-3 mm enhancing lesions right frontal white matter No significant edema  associated with metastatic disease. No midline shift. Negative for acute infarct. Chronic hemorrhagic infarcts in the parietal lobe bilaterally, right greater than left. Ventricle size normal without midline shift. Vascular: Normal arterial flow voids Skull and upper cervical spine: Negative Sinuses/Orbits: Mucosal edema paranasal sinuses. Cyst left maxillary sinus. Negative orbit Other: None IMPRESSION: Extensive metastatic disease in the brain. No significant edema or midline shift. Chronic hemorrhagic infarct in the parietal  lobe bilaterally. No acute infarct These results will be called to the ordering clinician or representative by the Radiologist Assistant, and communication documented in the PACS or zVision Dashboard. Electronically Signed   By: Franchot Gallo M.D.   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  Currently on crizotinib.  # Multiple brain mets-symptomatic-with nausea.  Discussed the option of using next-generation Ros-1 inhibitor /N-TRK inhibitor- Entrectinib 630m/day-this is approximately 50 to 60% intracranial responses.  Recommend Zofran as needed.  Also discussed option of whole brain radiation given the multiple lesions again reviewed the potential side effects of radiation including but not limited to fatigue memory deficits /cognitive deficits.  Patient seen development with radiation he states that he will discuss with his family.  Discussed with Dr. CDonella Stade  # New back pain/right hip pain-we will get a bone scan to rule out any metastasis.  Stable.  Await bone scan.  # CKD-stage III multifactorial [prostatitic obstruction/diabetes]- creatinine 1.4. stable.   # Prostate lesions/PSA 6.3-- s/p  MRI-discussed with Dr. SBernardo Heater awaiting;  MRI biopsy-however this needs to be reassessed in the context of his new brain lesions.  # Bil LE swelling: CKD/crizotinib compression stockings; stable  # Bilateral PE & left lower extremity DVT:  on Xarelto; stable  # HTN-better controlled; 150s/stable  # I offered to talk to his wife twice; he declines.   # DISPOSITION: # follow up in 2 weeks/No labs- dr.B  # I reviewed the blood work- with the patient in detail; also reviewed the imaging independently [as summarized above]; and with the patient in detail.   # 25 minutes face-to-face with the patient discussing the above plan of care; more than 50% of time spent on prognosis/ natural history; counseling and coordination.    No orders of the defined types were placed in this encounter.  All questions were answered. The patient knows to call the clinic with any problems, questions or concerns.      GCammie Sickle MD 03/22/2019 7:53 PM

## 2019-03-22 NOTE — Telephone Encounter (Signed)
Left vm for patient return my phone call as soon as possible

## 2019-03-22 NOTE — Telephone Encounter (Signed)
Oral Oncology Patient Advocate Encounter  Received notification from [facility] that prior authorization for Rozlytrek is required.  PA submitted on CoverMyMeds [facility] Status is pending  Oral Oncology Clinic will continue to follow.  [place] Patient [place] Phone [phone] Fax [phone] 03/22/2019 3:50 PM

## 2019-03-22 NOTE — Telephone Encounter (Signed)
2nd msg left 1059 am today. Requesting patient to call our office back asap.

## 2019-03-22 NOTE — Telephone Encounter (Signed)
Oral Oncology Patient Advocate Encounter  Prior Authorization for [facility] has been approved.    PA# [id number]  Effective dates: 03/22/2019 through 09/07/2019  Patients co-pay is $3047.30.  Due to high copay and not grant funding open, we will apply for patient assistance through [employer].  Oral Oncology Clinic will continue to follow.   [place] Patient [place] Phone [phone] Fax [phone] 03/22/2019 4:13 PM

## 2019-03-22 NOTE — Telephone Encounter (Signed)
Oral Oncology Pharmacist Encounter  Received new prescription for Rozlytrek (entrectinib) for the treatment of ROS+ metastatic lung cancer, planned duration until disease progression or unacceptable drug toxicity.  CMP from 03/20/2019 assessed, no relevant lab abnormalities. Prescription dose and frequency assessed.   Current medication list in Epic reviewed, no DDIs with entrectinib identified.  Prescription has been e-scribed to the [facility] for benefits analysis and approval.  Oral Oncology Clinic will continue to follow for insurance authorization, copayment issues, initial counseling and start date.  Darl Pikes, PharmD, BCPS, [facility] Hematology/Oncology Clinical Pharmacist [facility] Oral [facility] [phone]  03/22/2019 2:33 PM

## 2019-03-22 NOTE — Progress Notes (Signed)
MD requested pt be seen for MRI results. Pt reports having nausea and needs something called into pharmacy.

## 2019-03-23 ENCOUNTER — Telehealth: Payer: Self-pay | Admitting: Internal Medicine

## 2019-03-23 ENCOUNTER — Telehealth: Payer: Self-pay | Admitting: Pharmacy Technician

## 2019-03-23 ENCOUNTER — Telehealth: Payer: Self-pay | Admitting: *Deleted

## 2019-03-23 NOTE — Telephone Encounter (Signed)
Patient returned my phone call and accepted the new apt for Monday 7/20 at 9 am.

## 2019-03-23 NOTE — Telephone Encounter (Signed)
Patient returned Dr. Aletha Halim phone call. I personally explained to him that Dr. Rogue Bussing would like him to consult with Dr. Baruch Gouty to plan for radiation therapy. Apt obtained for Tuesday 7/21 at 9 am. Patient accepted this apt.

## 2019-03-23 NOTE — Telephone Encounter (Signed)
Received msg from [address] in radiation. Would like to change the apt to Monday 7/20 at 9 am. I attempted to reach patient back. vm left for patient to return my phone call to confirm the apt time change.

## 2019-03-23 NOTE — Telephone Encounter (Signed)
Oral Oncology Patient Advocate Encounter  Met patient in room on 9/44/7395 to complete application for [employer] in an effort to reduce patient's out of pocket expense for Rozlytrek to $0.    Application completed and faxed to [phone].   [employer] patient assistance phone number for follow up is [phone].   This encounter will be updated until final determination.   [place] Patient [place] Phone [phone] Fax [phone] 03/23/2019 10:33 AM

## 2019-03-23 NOTE — Telephone Encounter (Signed)
I tried to reach patient's wife to discuss my recommendations regarding radiation to the brain.  Only one phone number available for patient/wife.  Left messages.  Patient has appointment with radiation oncology 7/20-Dr. Donella Stade.  Currently awaiting N-TRK approval through insurance.  FYI

## 2019-03-24 NOTE — Telephone Encounter (Signed)
Called [employer] to make sure application was received.  Rep stated it was received and is in process.  Celso Amy will reach out to the office with the decision.

## 2019-03-27 ENCOUNTER — Ambulatory Visit
Admission: RE | Admit: 2019-03-27 | Discharge: 2019-03-27 | Disposition: A | Discharge status: Home / Self Care | Payer: Medicare Other | Source: Ambulatory Visit | Attending: Internal Medicine | Admitting: Internal Medicine

## 2019-03-27 ENCOUNTER — Encounter: Payer: Self-pay | Admitting: Radiation Oncology

## 2019-03-27 ENCOUNTER — Encounter
Admission: RE | Admit: 2019-03-27 | Discharge: 2019-03-27 | Disposition: A | Discharge status: Home / Self Care | Payer: Medicare Other | Source: Ambulatory Visit | Attending: Internal Medicine | Admitting: Internal Medicine

## 2019-03-27 ENCOUNTER — Ambulatory Visit
Admission: RE | Admit: 2019-03-27 | Discharge: 2019-03-27 | Disposition: A | Discharge status: Home / Self Care | Payer: Medicare Other | Source: Ambulatory Visit | Attending: Radiation Oncology | Admitting: Radiation Oncology

## 2019-03-27 ENCOUNTER — Other Ambulatory Visit: Payer: Self-pay

## 2019-03-27 ENCOUNTER — Telehealth: Payer: Self-pay | Admitting: Pharmacist

## 2019-03-27 VITALS — BP 149/89 | HR 101 | Temp 98.7°F | Resp 18

## 2019-03-27 DIAGNOSIS — C3412 Malignant neoplasm of upper lobe, left bronchus or lung: Secondary | ICD-10-CM | POA: Insufficient documentation

## 2019-03-27 DIAGNOSIS — R51 Headache: Secondary | ICD-10-CM

## 2019-03-27 DIAGNOSIS — Z923 Personal history of irradiation: Secondary | ICD-10-CM | POA: Insufficient documentation

## 2019-03-27 DIAGNOSIS — Z9221 Personal history of antineoplastic chemotherapy: Secondary | ICD-10-CM | POA: Insufficient documentation

## 2019-03-27 DIAGNOSIS — C778 Secondary and unspecified malignant neoplasm of lymph nodes of multiple regions: Secondary | ICD-10-CM | POA: Diagnosis not present

## 2019-03-27 DIAGNOSIS — C7931 Secondary malignant neoplasm of brain: Secondary | ICD-10-CM | POA: Diagnosis not present

## 2019-03-27 DIAGNOSIS — R519 Headache, unspecified: Secondary | ICD-10-CM

## 2019-03-27 DIAGNOSIS — M549 Dorsalgia, unspecified: Secondary | ICD-10-CM | POA: Insufficient documentation

## 2019-03-27 MED ORDER — TECHNETIUM TC 99M MEDRONATE IV KIT
22.5900 | PACK | Freq: Once | INTRAVENOUS | Status: AC | PRN
  Administered 2019-03-27: 10:00:00 22.59 via INTRAVENOUS

## 2019-03-27 NOTE — Progress Notes (Signed)
Radiation Oncology Follow up Note old patient new area of brain metastasis  Name: Melvin Willis   Date:   03/27/2019 MRN:  [id number] DOB: 11-14-1950    This 68 y.o. male presents to the clinic today for evaluation of brain metastasis and patient with known stage IV adenocarcinoma of the lung status post concurrent chemoradiation back in November 2018.  REFERRING PROVIDER: [name], MD  HPI: Patient is a 68 year old male now out over a year having completed concurrent chemoradiation therapy was chest for stage IV adenocarcinoma the left upper lobe.  He did have bilateral supraclavicular involvement.Marland Kitchen  He received [place] Taxol with radiation therapy back in September 2018.  Recently presented with multiple brain metastasis he is currently oncurrently on crizotinib.  Patient is also been having some back pain a bone scan has been ordered.  His last CT scan of the chest back in April showed posttreatment changes with chronic occlusion of the left upper lobe bronchus no evidence of local tumor recurrence in his chest.  Recent MRI scan showed extensive metastatic disease in the brain with no significant edema.  He has been having some nausea which is now under control he is having no change in visual fields no focal neurologic deficits.  He is now referred to radiation oncology for consideration of treatment.  Patient is having a bone scan today  COMPLICATIONS OF TREATMENT: none  FOLLOW UP COMPLIANCE: keeps appointments   PHYSICAL EXAM:  BP (!) 149/89   Pulse (!) 101   Temp 98.7 F (37.1 C)   Resp 18  Well-developed well-nourished patient in NAD. HEENT reveals PERLA, EOMI, discs not visualized.  Oral cavity is clear. No oral mucosal lesions are identified. Neck is clear without evidence of cervical or supraclavicular adenopathy. Lungs are clear to A&P. Cardiac examination is essentially unremarkable with regular rate and rhythm without murmur rub or thrill. Abdomen is benign with no  organomegaly or masses noted. Motor sensory and DTR levels are equal and symmetric in the upper and lower extremities. Cranial nerves II through XII are grossly intact. Proprioception is intact. No peripheral adenopathy or edema is identified. No motor or sensory levels are noted. Crude visual fields are within normal range.  RADIOLOGY RESULTS: MRI scan and CT scan of the chest both reviewed compatible with above-stated findings  PLAN: At this time based on the multiple brain metastasis would recommend whole brain radiation therapy.  Would plan on delivering 3000 cGy in 12 fractions.  Risks and benefits of treatment including possible slight cognitive decline fatigue hair loss alteration of blood counts and skin reaction all were discussed in detail with the patient.  Patient seems to comprehend my treatment plan well.  I have personally set up and ordered CT simulation.  I would like to take this opportunity to thank you for allowing me to participate in the care of your patient.Noreene Filbert, MD

## 2019-03-27 NOTE — Telephone Encounter (Signed)
Oral Chemotherapy Pharmacist Encounter  Application completed for Xarelto patient assistance in an effort to reduce patient's out of pocket expense for Xarelto to $0. Application faxed to [employer] patient assistance.   [employer] patient assistance patient assistance phone number for follow up is [phone].   This encounter will be updated until final determination.   Darl Pikes, PharmD, BCPS, [facility] Hematology/Oncology Clinical Pharmacist [facility] [place] [facility] [phone]  03/27/2019 10:01 AM

## 2019-03-28 ENCOUNTER — Institutional Professional Consult (permissible substitution): Payer: Medicare Other | Admitting: Radiation Oncology

## 2019-03-28 ENCOUNTER — Other Ambulatory Visit: Payer: Self-pay

## 2019-03-29 ENCOUNTER — Other Ambulatory Visit: Payer: Self-pay

## 2019-03-29 ENCOUNTER — Other Ambulatory Visit: Payer: Self-pay | Admitting: *Deleted

## 2019-03-29 ENCOUNTER — Ambulatory Visit
Admission: RE | Admit: 2019-03-29 | Discharge: 2019-03-29 | Disposition: A | Discharge status: Home / Self Care | Payer: Medicare Other | Source: Ambulatory Visit | Attending: Radiation Oncology | Admitting: Radiation Oncology

## 2019-03-29 DIAGNOSIS — Z510 Encounter for antineoplastic radiation therapy: Secondary | ICD-10-CM | POA: Insufficient documentation

## 2019-03-29 DIAGNOSIS — C3412 Malignant neoplasm of upper lobe, left bronchus or lung: Secondary | ICD-10-CM | POA: Insufficient documentation

## 2019-03-29 DIAGNOSIS — C7931 Secondary malignant neoplasm of brain: Secondary | ICD-10-CM | POA: Insufficient documentation

## 2019-03-29 DIAGNOSIS — C778 Secondary and unspecified malignant neoplasm of lymph nodes of multiple regions: Secondary | ICD-10-CM | POA: Diagnosis not present

## 2019-03-29 MED ORDER — DEXAMETHASONE 4 MG PO TABS: 4.0000 mg | ORAL_TABLET | Freq: Every day | ORAL | 0 refills | Status: DC

## 2019-03-30 NOTE — Telephone Encounter (Signed)
Oral Chemotherapy Pharmacist Encounter   Mr. Vanpatten spoke with [facility], the dispensing pharmacy for [facility] patient assistance and his medication is set-up for delivery on 04/05/2019.  Darl Pikes, PharmD, BCPS, [facility] Hematology/Oncology Clinical Pharmacist [facility] Oral [place] [facility] [phone]  03/30/2019 4:20 PM

## 2019-03-30 NOTE — Telephone Encounter (Signed)
Oral Chemotherapy Pharmacist Encounter  Received notification from [facility] Patient Assistance program that patient has been successfully enrolled into their program to receive Rozlytrek from the manufacturer at $0 out of pocket until therapy is discontinued, health insurance or financial status changes, or no longer meet eligibility requirements.    I called and spoke with patient. He knows to call the office with questions or concerns.   Oral Oncology Clinic will continue to follow.  Darl Pikes, PharmD, BCPS, [facility] Hematology/Oncology Clinical Pharmacist [facility] Oral [place] [facility] [phone]  03/30/2019 3:58 PM

## 2019-04-03 ENCOUNTER — Other Ambulatory Visit: Payer: Self-pay | Admitting: *Deleted

## 2019-04-04 ENCOUNTER — Inpatient Hospital Stay (HOSPITAL_BASED_OUTPATIENT_CLINIC_OR_DEPARTMENT_OTHER): Payer: Medicare Other | Admitting: Internal Medicine

## 2019-04-04 ENCOUNTER — Other Ambulatory Visit: Payer: Self-pay

## 2019-04-04 DIAGNOSIS — M25551 Pain in right hip: Secondary | ICD-10-CM

## 2019-04-04 DIAGNOSIS — Z79899 Other long term (current) drug therapy: Secondary | ICD-10-CM

## 2019-04-04 DIAGNOSIS — I129 Hypertensive chronic kidney disease with stage 1 through stage 4 chronic kidney disease, or unspecified chronic kidney disease: Secondary | ICD-10-CM

## 2019-04-04 DIAGNOSIS — R600 Localized edema: Secondary | ICD-10-CM

## 2019-04-04 DIAGNOSIS — C3412 Malignant neoplasm of upper lobe, left bronchus or lung: Secondary | ICD-10-CM | POA: Diagnosis not present

## 2019-04-04 DIAGNOSIS — Z86711 Personal history of pulmonary embolism: Secondary | ICD-10-CM

## 2019-04-04 DIAGNOSIS — C7931 Secondary malignant neoplasm of brain: Secondary | ICD-10-CM

## 2019-04-04 DIAGNOSIS — E785 Hyperlipidemia, unspecified: Secondary | ICD-10-CM

## 2019-04-04 DIAGNOSIS — Z833 Family history of diabetes mellitus: Secondary | ICD-10-CM

## 2019-04-04 DIAGNOSIS — N183 Chronic kidney disease, stage 3 (moderate): Secondary | ICD-10-CM | POA: Diagnosis not present

## 2019-04-04 DIAGNOSIS — Z8673 Personal history of transient ischemic attack (TIA), and cerebral infarction without residual deficits: Secondary | ICD-10-CM

## 2019-04-04 DIAGNOSIS — C7951 Secondary malignant neoplasm of bone: Secondary | ICD-10-CM

## 2019-04-04 DIAGNOSIS — R5383 Other fatigue: Secondary | ICD-10-CM | POA: Diagnosis not present

## 2019-04-04 DIAGNOSIS — E1122 Type 2 diabetes mellitus with diabetic chronic kidney disease: Secondary | ICD-10-CM | POA: Diagnosis not present

## 2019-04-04 DIAGNOSIS — M545 Low back pain: Secondary | ICD-10-CM | POA: Diagnosis not present

## 2019-04-04 DIAGNOSIS — Z7984 Long term (current) use of oral hypoglycemic drugs: Secondary | ICD-10-CM | POA: Diagnosis not present

## 2019-04-04 DIAGNOSIS — Z86718 Personal history of other venous thrombosis and embolism: Secondary | ICD-10-CM

## 2019-04-04 DIAGNOSIS — Z7901 Long term (current) use of anticoagulants: Secondary | ICD-10-CM

## 2019-04-04 DIAGNOSIS — Z8249 Family history of ischemic heart disease and other diseases of the circulatory system: Secondary | ICD-10-CM

## 2019-04-04 NOTE — Progress Notes (Signed)
[facility] OFFICE PROGRESS NOTE  Patient Care Team: [name], [name], MD as Medical Oncologist (Medical Oncology)  Cancer Staging No matching staging information was found for the patient.   Oncology History Overview Note  # OCT 2017- ADENO CA LUNG; STAGE IV [; LUL; bil supraclavicular LN; Left neck LN Bx]; ROS-1 MUTATED; s/p Carbo-alimta x1[oct 2017]  # NOV 1st 2017- XALKORI 250 mg BID; JAN 15th CT- PR;  # AUG 27th Chemo-RT to persistent LUL/mediastinal LN [s/p carbo-taxol- with RT; finished Sep 22nd 2018];   # July 14th 2020- multiple metastatic lesions of the brain [July 30 whole brain radiation]; bone scan skull metastases /posterior left ninth rib metastases; April 04, 2019 stopped crizotinib; awaiting Entrectinib.   # LLE DVT/bil PE/Multiple strokes [? On xarelto]-Lovenox; Jan mid 2018- xarelto [lovenox-insurance issues]  # MRI brain- multiple infarcts [2d echo/bubble study-NEG's/p Neurology eval]  ------------------------------------------------------------    # # s/p TURP [Sep 2017; Dr.Cope] DEC 26th CT- distended bladder   # MOLECULAR TESTING- ROS-1 POSITIVE; ALK/EGFR-NEG; PDL-1 EXPRESSION- 90%** [HIGH] -----------------------------------------------------------------    DIAGNOSIS: Adenocarcinoma the lung  ROS-1 +  STAGE:  IV    ;GOALS: Palliative  CURRENT/MOST RECENT THERAPY-r whole brain radiation   Primary cancer of left upper lobe of lung ([place])      INTERVAL HISTORY:  Melvin Willis 68 y.o.  male pleasant patient above history of metastatic lung cancer-Ros: Positive currently on crizotinib is here for follow-up/review this is a bone scan.  In the interim patient was evaluated by radiation oncology-awaiting to start whole brain radiation on July 30th.  Patient denies having any nausea.  Denies any vomiting.  Denies any worsening headaches.  No seizure-like activity.  Continues  to have intermittent pain in the left anterior chest wall.  Review of Systems  Constitutional: Positive for malaise/fatigue. Negative for chills, diaphoresis, fever and weight loss.  HENT: Negative for nosebleeds and sore throat.   Eyes: Negative for double vision.  Respiratory: Negative for cough, hemoptysis, sputum production, shortness of breath and wheezing.   Cardiovascular: Positive for leg swelling. Negative for chest pain (Left upper chest wall pain chronic), palpitations and orthopnea.  Gastrointestinal: Negative for abdominal pain, blood in stool, constipation, diarrhea, heartburn and melena.  Genitourinary: Negative for dysuria, frequency and urgency.  Musculoskeletal: Negative for back pain and joint pain.  Skin: Negative.  Negative for itching and rash.  Neurological: Negative for dizziness, tingling, focal weakness and weakness.  Endo/Heme/Allergies: Does not bruise/bleed easily.  Psychiatric/Behavioral: Negative for depression. The patient is not nervous/anxious and does not have insomnia.       PAST MEDICAL HISTORY :  Past Medical History:  Diagnosis Date  . Abnormal prostate specific antigen 08/08/2012  . Adiposity 04/16/2015  . Chronic kidney disease (CKD), stage III (moderate) ([place]) 11/27/2016  . CVA (cerebral vascular accident) ([place]) 06/17/2016  . Diabetes mellitus without complication ([place])   . Diverticulosis of sigmoid colon 04/16/2015  . Dyslipidemia 03/18/2015  . Hemorrhoids, internal 04/16/2015  . Hypercholesteremia 04/16/2015  . Hyperlipidemia   . Hypertension   . Primary cancer of left upper lobe of lung ([place])   . Pulmonary embolism ([place])   . Wears dentures    partial upper    PAST SURGICAL HISTORY :   Past Surgical History:  Procedure Laterality Date  . COLONOSCOPY    . COLONOSCOPY WITH PROPOFOL N/A 05/06/2015   Procedure: COLONOSCOPY WITH PROPOFOL;  Surgeon: Lucilla Lame, MD;  Location: [facility];  Service: Endoscopy;  Laterality: N/A;  ASCENDING  COLON POLYPS X 2 TERMINAL ILEUM BIOPSY RANDOM COLON BX. TRANSVERSE COLON POLYP SIGMOID COLON POLYP  . ESOPHAGOGASTRODUODENOSCOPY (EGD) WITH PROPOFOL N/A 05/06/2015   Procedure: ESOPHAGOGASTRODUODENOSCOPY (EGD) WITH PROPOFOL;  Surgeon: Lucilla Lame, MD;  Location: [facility];  Service: Endoscopy;  Laterality: N/A;  GASTRIC BIOPSY X1  . KIDNEY STONE SURGERY  2017    FAMILY HISTORY :   Family History  Problem Relation Age of Onset  . Diabetes Mother   . Diabetes Father   . CAD Father   . Dementia Father   . Diabetes Sister   . Cancer Maternal Uncle        Prostate  . Cancer Cousin        prostate    SOCIAL HISTORY:   Social History   Tobacco Use  . Smoking status: Never Smoker  . Smokeless tobacco: Never Used  Substance Use Topics  . Alcohol use: No    Alcohol/week: 0.0 standard drinks  . Drug use: No    ALLERGIES:  has No Known Allergies.  MEDICATIONS:  Current Outpatient Medications  Medication Sig Dispense Refill  . atorvastatin (LIPITOR) 40 MG tablet Take 1 tablet (40 mg total) by mouth 3 (three) times a week. 48 tablet 1  . crizotinib (XALKORI) 250 MG capsule Take 1 capsule (250 mg total) by mouth 2 (two) times daily. 60 capsule 3  . [START ON 04/05/2019] dexamethasone (DECADRON) 4 MG tablet Take 1 tablet (4 mg total) by mouth daily. 20 tablet 0  . entrectinib (ROZLYTREK) 200 MG capsule Take 3 capsules (600 mg total) by mouth daily. 90 capsule 6  . finasteride (PROSCAR) 5 MG tablet Take 1 tablet (5 mg total) by mouth daily. 90 tablet 3  . glipiZIDE (GLUCOTROL) 5 MG tablet Take 0.5 tablets (2.5 mg total) by mouth 2 (two) times daily before a meal. 90 tablet 0  . lisinopril (ZESTRIL) 5 MG tablet Take 1 tablet (5 mg total) by mouth daily. 90 tablet 1  . metFORMIN (GLUCOPHAGE) 500 MG tablet Take 1 tablet (500 mg total) by mouth 2 (two) times daily with a meal. 180 tablet 0  . ondansetron (ZOFRAN) 8 MG tablet One pill every 8 hours as needed for nausea/vomitting.  40 tablet 1  . rivaroxaban (XARELTO) 20 MG TABS tablet Take 1 tablet (20 mg total) by mouth daily with supper. 30 tablet 11  . tamsulosin (FLOMAX) 0.4 MG CAPS capsule Take 1 capsule (0.4 mg total) by mouth every morning. 90 capsule 3   No current facility-administered medications for this visit.     PHYSICAL EXAMINATION: ECOG PERFORMANCE STATUS: 0 - Asymptomatic  BP (!) 157/85   Pulse 77   Temp 98 F (36.7 C)   Wt 170 lb (77.1 kg)   BMI 29.18 kg/m   Filed Weights   04/04/19 1301  Weight: 170 lb (77.1 kg)    Physical Exam  Constitutional: He is oriented to person, place, and time and well-developed, well-nourished, and in no distress.  Alone. He is walking by himself.  HENT:  Head: Normocephalic and atraumatic.  Mouth/Throat: Oropharynx is clear and moist. No oropharyngeal exudate.  Eyes: Pupils are equal, round, and reactive to light.  Neck: Normal range of motion. Neck supple.  Cardiovascular: Normal rate and regular rhythm.  Pulmonary/Chest: No respiratory distress. He has no wheezes.  Abdominal: Soft. Bowel sounds are normal. He exhibits no distension and no mass. There is no abdominal  tenderness. There is no rebound and no guarding.  Musculoskeletal: Normal range of motion.        General: No tenderness or edema.  Neurological: He is alert and oriented to person, place, and time.  Skin: Skin is warm.  Psychiatric: Affect normal.       LABORATORY DATA:  I have reviewed the data as listed    Component Value Date/Time   NA 134 (L) 03/20/2019 1030   NA 139 01/01/2016 1000   K 4.0 03/20/2019 1030   CL 107 03/20/2019 1030   CO2 20 (L) 03/20/2019 1030   GLUCOSE 187 (H) 03/20/2019 1030   BUN 20 03/20/2019 1030   BUN 13 01/01/2016 1000   CREATININE 1.42 (H) 03/20/2019 1030   CREATININE 1.32 (H) 08/22/2018 1107   CALCIUM 8.8 (L) 03/20/2019 1030   PROT 7.4 03/20/2019 1030   PROT 7.2 01/01/2016 1000   ALBUMIN 3.5 03/20/2019 1030   ALBUMIN 4.4 01/01/2016 1000    AST 23 03/20/2019 1030   ALT 45 (H) 03/20/2019 1030   ALKPHOS 87 03/20/2019 1030   BILITOT 0.6 03/20/2019 1030   BILITOT 0.4 01/01/2016 1000   GFRNONAA 51 (L) 03/20/2019 1030   GFRNONAA 56 (L) 08/22/2018 1107   GFRAA 60 (L) 03/20/2019 1030   GFRAA 65 08/22/2018 1107    No results found for: SPEP, UPEP  Lab Results  Component Value Date   WBC 5.5 03/20/2019   NEUTROABS 2.3 03/20/2019   HGB 15.0 03/20/2019   HCT 44.7 03/20/2019   MCV 83.7 03/20/2019   PLT 224 03/20/2019      Chemistry      Component Value Date/Time   NA 134 (L) 03/20/2019 1030   NA 139 01/01/2016 1000   K 4.0 03/20/2019 1030   CL 107 03/20/2019 1030   CO2 20 (L) 03/20/2019 1030   BUN 20 03/20/2019 1030   BUN 13 01/01/2016 1000   CREATININE 1.42 (H) 03/20/2019 1030   CREATININE 1.32 (H) 08/22/2018 1107      Component Value Date/Time   CALCIUM 8.8 (L) 03/20/2019 1030   ALKPHOS 87 03/20/2019 1030   AST 23 03/20/2019 1030   ALT 45 (H) 03/20/2019 1030   BILITOT 0.6 03/20/2019 1030   BILITOT 0.4 01/01/2016 1000       RADIOGRAPHIC STUDIES: I have personally reviewed the radiological images as listed and agreed with the findings in the report. No results found.   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  Currently on crizotinib; worsening.   # given the intracranial progression- STOP crizotinib. Awaiting to start RT on 7/30 until 8/14.   # Proceed with Entrectinib [approved by insurance] after finishing RT.   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  None in the weightbearing joints.  # CKD-stage III multifactorial [prostatitic obstruction/diabetes]- creatinine 1.4.  Stable.   # Prostate lesions/PSA 6.3--  s/p MRI-discussed with Dr. Bernardo Heater; awaiting however recommend holding this in the context of his brain metastasis.  # Bil LE swelling: CKD/ stable.   # Bilateral PE & left lower extremity DVT:  on Xarelto; stable.   # DISPOSITION: # follow up in 3 weeks/cbc-cmp- dr.B    Orders Placed This Encounter  Procedures  . CBC with Differential    Standing Status:   Future  Standing Expiration Date:   04/03/2020  . Comprehensive metabolic panel    Standing Status:   Future    Standing Expiration Date:   04/03/2020   All questions were answered. The patient knows to call the clinic with any problems, questions or concerns.      Cammie Sickle, MD 04/04/2019 1:34 PM

## 2019-04-04 NOTE — Progress Notes (Signed)
Pt offers no new concerns today.

## 2019-04-04 NOTE — Assessment & Plan Note (Addendum)
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  Currently on crizotinib; worsening.   # given the intracranial progression- STOP crizotinib. Awaiting to start RT on 7/30 until 8/14.   # Proceed with Entrectinib [approved by insurance] after finishing RT.   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  None in the weightbearing joints.  # CKD-stage III multifactorial [prostatitic obstruction/diabetes]- creatinine 1.4.  Stable.   # Prostate lesions/PSA 6.3-- s/p MRI-discussed with Dr. Bernardo Heater; awaiting however recommend holding this in the context of his brain metastasis.  # Bil LE swelling: CKD/ stable.   # Bilateral PE & left lower extremity DVT:  on Xarelto; stable.   # DISPOSITION: # follow up in 3 weeks/cbc-cmp- dr.B

## 2019-04-05 ENCOUNTER — Other Ambulatory Visit: Payer: Self-pay

## 2019-04-05 ENCOUNTER — Ambulatory Visit
Admission: RE | Admit: 2019-04-05 | Discharge: 2019-04-05 | Disposition: A | Discharge status: Home / Self Care | Payer: Medicare Other | Source: Ambulatory Visit | Attending: Radiation Oncology | Admitting: Radiation Oncology

## 2019-04-05 DIAGNOSIS — Z510 Encounter for antineoplastic radiation therapy: Secondary | ICD-10-CM | POA: Diagnosis not present

## 2019-04-05 DIAGNOSIS — C3412 Malignant neoplasm of upper lobe, left bronchus or lung: Secondary | ICD-10-CM | POA: Diagnosis not present

## 2019-04-05 DIAGNOSIS — C7931 Secondary malignant neoplasm of brain: Secondary | ICD-10-CM | POA: Diagnosis not present

## 2019-04-05 DIAGNOSIS — C778 Secondary and unspecified malignant neoplasm of lymph nodes of multiple regions: Secondary | ICD-10-CM | POA: Diagnosis not present

## 2019-04-05 NOTE — Telephone Encounter (Signed)
Oral Oncology Patient Advocate Encounter  Called [employer] to check the renewal status of application.  Patients current application is still good until 05/23/2019.  They will hold his renewal application until it is ready to renew and they will let us know the decision.  Melvin Patient Willis Phone [phone] Fax [phone] 04/05/2019 11:24 AM

## 2019-04-06 ENCOUNTER — Ambulatory Visit
Admission: RE | Admit: 2019-04-06 | Discharge: 2019-04-06 | Disposition: A | Discharge status: Home / Self Care | Payer: Medicare Other | Source: Ambulatory Visit | Attending: Radiation Oncology | Admitting: Radiation Oncology

## 2019-04-06 ENCOUNTER — Other Ambulatory Visit: Payer: Self-pay

## 2019-04-06 DIAGNOSIS — C778 Secondary and unspecified malignant neoplasm of lymph nodes of multiple regions: Secondary | ICD-10-CM | POA: Diagnosis not present

## 2019-04-06 DIAGNOSIS — C3412 Malignant neoplasm of upper lobe, left bronchus or lung: Secondary | ICD-10-CM | POA: Diagnosis not present

## 2019-04-06 DIAGNOSIS — Z510 Encounter for antineoplastic radiation therapy: Secondary | ICD-10-CM | POA: Diagnosis not present

## 2019-04-06 DIAGNOSIS — C7931 Secondary malignant neoplasm of brain: Secondary | ICD-10-CM | POA: Diagnosis not present

## 2019-04-07 ENCOUNTER — Other Ambulatory Visit: Payer: Self-pay

## 2019-04-07 ENCOUNTER — Encounter: Payer: Self-pay | Admitting: Family Medicine

## 2019-04-07 ENCOUNTER — Ambulatory Visit (INDEPENDENT_AMBULATORY_CARE_PROVIDER_SITE_OTHER): Payer: Medicare Other | Admitting: Family Medicine

## 2019-04-07 ENCOUNTER — Telehealth: Payer: Self-pay | Admitting: Urology

## 2019-04-07 ENCOUNTER — Ambulatory Visit
Admission: RE | Admit: 2019-04-07 | Discharge: 2019-04-07 | Disposition: A | Discharge status: Home / Self Care | Payer: Medicare Other | Source: Ambulatory Visit | Attending: Radiation Oncology | Admitting: Radiation Oncology

## 2019-04-07 VITALS — BP 144/76 | HR 90 | Temp 97.3°F | Resp 16 | Ht 64.0 in | Wt 167.3 lb

## 2019-04-07 DIAGNOSIS — C3412 Malignant neoplasm of upper lobe, left bronchus or lung: Secondary | ICD-10-CM | POA: Diagnosis not present

## 2019-04-07 DIAGNOSIS — C7931 Secondary malignant neoplasm of brain: Secondary | ICD-10-CM | POA: Diagnosis not present

## 2019-04-07 DIAGNOSIS — Z8673 Personal history of transient ischemic attack (TIA), and cerebral infarction without residual deficits: Secondary | ICD-10-CM

## 2019-04-07 DIAGNOSIS — E1122 Type 2 diabetes mellitus with diabetic chronic kidney disease: Secondary | ICD-10-CM

## 2019-04-07 DIAGNOSIS — N183 Chronic kidney disease, stage 3 unspecified: Secondary | ICD-10-CM

## 2019-04-07 DIAGNOSIS — C778 Secondary and unspecified malignant neoplasm of lymph nodes of multiple regions: Secondary | ICD-10-CM | POA: Diagnosis not present

## 2019-04-07 DIAGNOSIS — E7800 Pure hypercholesterolemia, unspecified: Secondary | ICD-10-CM | POA: Diagnosis not present

## 2019-04-07 DIAGNOSIS — I82502 Chronic embolism and thrombosis of unspecified deep veins of left lower extremity: Secondary | ICD-10-CM

## 2019-04-07 DIAGNOSIS — C3490 Malignant neoplasm of unspecified part of unspecified bronchus or lung: Secondary | ICD-10-CM

## 2019-04-07 DIAGNOSIS — Z510 Encounter for antineoplastic radiation therapy: Secondary | ICD-10-CM | POA: Diagnosis not present

## 2019-04-07 LAB — POCT GLYCOSYLATED HEMOGLOBIN (HGB A1C): HbA1c, POC (controlled diabetic range): 9.8 % — AB (ref 0.0–7.0)

## 2019-04-07 MED ORDER — METFORMIN HCL 500 MG PO TABS: 500.0000 mg | ORAL_TABLET | Freq: Two times a day (BID) | ORAL | 0 refills | Status: DC

## 2019-04-07 MED ORDER — TRESIBA FLEXTOUCH 100 UNIT/ML ~~LOC~~ SOPN: 10.0000 [IU] | PEN_INJECTOR | Freq: Every day | SUBCUTANEOUS | 0 refills | Status: DC

## 2019-04-07 NOTE — Telephone Encounter (Signed)
Spoke with Tammy @ [facility] today and she said that this patient has started radiation treatments at the cancer center here @ [facility]. Do you want him to still have the [facility] BX now? The patient did not want to schedule when she called due to everything that is going on right now.  Please Advise   Thanks, [name]

## 2019-04-07 NOTE — Progress Notes (Signed)
Name: Melvin Willis   MRN: [id number]    DOB: [birth year]   Date:04/07/2019       Progress Note  Subjective  Chief Complaint  Chief Complaint  Patient presents with  . Medication Refill  . Diabetes  . Chronic DVT left leg  . Lung Cancer  . History of CVA    HPI  DMII: hestill takingmetformin twice daly, could not tolerate a higher dose because if caused nausea and vomiting and we added low dose glipizide in Dec,he hs  taking half pill twice daily, his  last hgbA1C was  7.2% and today is up to 9.8%  urine micro is 50, he is on half dose of lisinopril every other day for kidney protection, no longer having dizziness.  Marland KitchenHe states higher dose of glipizide causes hypoglycemia, he states still eats sweets daily, he is still walking but not as long, he states glucose fasting has been up to 200's . He has not been checking glucose daily, explained that since glucose is so high and also because of decadron for metastatic brain tumor we will switch from glipizide to tresiba, gave him sample today, and he will contact me by mychart every 5 days to let me know his sugar readings in am.   Chronic DVT left leg, history of PE : on long term Xarelto, rx given by Hematologist. He denies easy bruising. He was diagnosed with DVT and PE prior to cancer diagnosis back in 2017. He has some leg edema and wears compression stocking hoses daily to control symptoms. He is on Xarelto for a life time, he denies bruising or bleeding.   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 Mickel Fuchs and Dr. Baruch Gouty, started on decadron and also having radiation therapy for 12 days, started yesterday, tolerating at this tim.  History of CVA: initially had dizziness and left side weakness, but no sequela now, still on statin and also on Xarelto, only takes statins a few times a week and last lipid panel was  at goal .Unchanged   Patient Active Problem List   Diagnosis Date Noted  . Primary malignant neoplasm of lung metastatic to other site ([place]) 04/07/2019  . Chronic deep vein thrombosis (DVT) of left lower extremity ([place]) 01/05/2018  . Chronic kidney disease (CKD), stage III (moderate) ([place]) 11/27/2016  . Old cerebrovascular accident (CVA) without late effect   . Blurred vision, bilateral 06/23/2016  . Primary cancer of left upper lobe of lung ([place]) 06/12/2016  . Mediastinal mass   . History of nephrolithiasis 03/27/2016  . Pharyngeal dysphagia 01/01/2016  . Benign neoplasm of ascending colon   . Benign neoplasm of sigmoid colon   . Benign neoplasm of transverse colon   . Diverticulosis of large intestine without diverticulitis   . Overweight (BMI 25.0-29.9) 04/16/2015  . Controlled diabetes mellitus with chronic kidney disease ([place]) 03/18/2015  . Dyslipidemia 03/18/2015  . Disorder of male genital organ 08/08/2012  . 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 Elevated prostate specific antigen (PSA) 08/08/2012    Past Surgical History:  Procedure Laterality Date  . COLONOSCOPY    . COLONOSCOPY WITH PROPOFOL N/A 05/06/2015   Procedure: COLONOSCOPY WITH PROPOFOL;  Surgeon: Lucilla Lame, MD;  Location: [facility];  Service: Endoscopy;  Laterality: N/A;  ASCENDING COLON POLYPS X 2 TERMINAL ILEUM BIOPSY RANDOM COLON BX. TRANSVERSE COLON POLYP SIGMOID COLON POLYP  .  ESOPHAGOGASTRODUODENOSCOPY (EGD) WITH PROPOFOL N/A 05/06/2015   Procedure: ESOPHAGOGASTRODUODENOSCOPY (EGD) WITH PROPOFOL;  Surgeon: Lucilla Lame, MD;  Location: [facility];  Service: Endoscopy;  Laterality: N/A;  GASTRIC BIOPSY X1  . KIDNEY STONE SURGERY  2017    Family History  Problem Relation Age of Onset  . Diabetes Mother   . Diabetes Father   . CAD Father   . Dementia Father   . Diabetes Sister   . Cancer Maternal Uncle        Prostate  . Cancer Cousin        prostate     Social History   Socioeconomic History  . Marital status: Married    Spouse name: Melissa   . Number of children: 3  . Years of education: Not on file  . Highest education level: Not on file  Occupational History  . Occupation: disable     Comment: from CVA and lung cancer  Social Needs  . Financial resource strain: Somewhat hard  . Food insecurity    Worry: Never true    Inability: Never true  . Transportation needs    Medical: No    Non-medical: No  Tobacco Use  . Smoking status: Never Smoker  . Smokeless tobacco: Never Used  Substance and Sexual Activity  . Alcohol use: No    Alcohol/week: 0.0 standard drinks  . Drug use: No  . Sexual activity: Yes    Partners: Female  Lifestyle  . Physical activity    Days per week: 3 days    Minutes per session: 30 min  . Stress: Not at all  Relationships  . Social connections    Talks on phone: More than three times a week    Gets together: Three times a week    Attends religious service: More than 4 times per year    Active member of club or organization: No    Attends meetings of clubs or organizations: Never    Relationship status: Married  . Intimate partner violence    Fear of current or ex partner: No    Emotionally abused: No    Physically abused: No    Forced sexual activity: No  Other Topics Concern  . Not on file  Social History Narrative   Used to work until 2 years ago when got sick with DVT leg , PE , CVA and found out he had lung cancer. He has medicare now      Current Outpatient Medications:  .  atorvastatin (LIPITOR) 40 MG tablet, Take 1 tablet (40 mg total) by mouth 3 (three) times a week., Disp: 48 tablet, Rfl: 1 .  dexamethasone (DECADRON) 4 MG tablet, Take 1 tablet (4 mg total) by mouth daily., Disp: 20 tablet, Rfl: 0 .  entrectinib (ROZLYTREK) 200 MG capsule, Take 3 capsules (600 mg total) by mouth daily., Disp: 90 capsule, Rfl: 6 .  finasteride (PROSCAR) 5 MG tablet, Take 1 tablet (5 mg  total) by mouth daily., Disp: 90 tablet, Rfl: 3 .  lisinopril (ZESTRIL) 5 MG tablet, Take 1 tablet (5 mg total) by mouth daily., Disp: 90 tablet, Rfl: 1 .  metFORMIN (GLUCOPHAGE) 500 MG tablet, Take 1 tablet (500 mg total) by mouth 2 (two) times daily with a meal., Disp: 180 tablet, Rfl: 0 .  ondansetron (ZOFRAN) 8 MG tablet, One pill every 8 hours as needed for nausea/vomitting., Disp: 40 tablet, Rfl: 1 .  rivaroxaban (XARELTO) 20 MG TABS tablet, Take 1 tablet (20 mg total)  by mouth daily with supper., Disp: 30 tablet, Rfl: 11 .  tamsulosin (FLOMAX) 0.4 MG CAPS capsule, Take 1 capsule (0.4 mg total) by mouth every morning., Disp: 90 capsule, Rfl: 3  No Known Allergies  I personally reviewed active problem list, medication list, allergies, family history, social history with the patient/caregiver today.   ROS  Constitutional: Negative for fever , positive for  weight change, he said it went up but is coming down again .  Respiratory: Negative for cough and shortness of breath.   Cardiovascular: Negative for chest pain or palpitations.  Gastrointestinal: Negative for abdominal pain, no bowel changes.  Musculoskeletal: Negative for gait problem or joint swelling.  Skin: Negative for rash.  Neurological: Negative for dizziness or headache.  No other specific complaints in a complete review of systems (except as listed in HPI above).  Objective  Vitals:   04/07/19 1124  BP: (!) 144/76  Pulse: 90  Resp: 16  Temp: (!) 97.3 F (36.3 C)  TempSrc: Temporal  SpO2: 98%  Weight: 167 lb 4.8 oz (75.9 kg)  Height: 5\' 4"  (1.626 m)    Body mass index is 28.72 kg/m.  Physical Exam  Constitutional: Patient appears well-developed and well-nourished. No distress.  HEENT: head atraumatic, normocephalic, pupils equal and reactive to light, neck supple Cardiovascular: Normal rate, regular rhythm and normal heart sounds.  No murmur heard. No BLE edema. Pulmonary/Chest: Effort normal and breath  sounds normal. No respiratory distress. Abdominal: Soft.  There is no tenderness. Psychiatric: Patient has a normal mood and affect. behavior is normal. Judgment and thought content normal.  Recent Results (from the past 2160 hour(s))  PSA     Status: Abnormal   Collection Time: 02/06/19 10:06 AM  Result Value Ref Range   Prostatic Specific Antigen 6.74 (H) 0.00 - 4.00 ng/mL    Comment: (NOTE) While PSA levels of <=4.0 ng/ml are reported as reference range, some men with levels below 4.0 ng/ml can have prostate cancer and many men with PSA above 4.0 ng/ml do not have prostate cancer.  Other tests such as free PSA, age specific reference ranges, PSA velocity and PSA doubling time may be helpful especially in men less than 46 years old. Performed at [facility], [place] [address] [place], [place] [address]   Comprehensive metabolic panel     Status: Abnormal   Collection Time: 02/06/19 10:06 AM  Result Value Ref Range   Sodium 135 135 - 145 mmol/L   Potassium 4.0 3.5 - 5.1 mmol/L   Chloride 107 98 - 111 mmol/L   CO2 18 (L) 22 - 32 mmol/L   Glucose, Bld 191 (H) 70 - 99 mg/dL   BUN 18 8 - 23 mg/dL   Creatinine, Ser 1.40 (H) 0.61 - 1.24 mg/dL   Calcium 8.6 (L) 8.9 - 10.3 mg/dL   Total Protein 6.5 6.5 - 8.1 g/dL   Albumin 3.3 (L) 3.5 - 5.0 g/dL   AST 26 15 - 41 U/L   ALT 48 (H) 0 - 44 U/L   Alkaline Phosphatase 86 38 - 126 U/L   Total Bilirubin 0.5 0.3 - 1.2 mg/dL   GFR calc non Af Amer 52 (L) >60 mL/min   GFR calc Af Amer >60 >60 mL/min   Anion gap 10 5 - 15    Comment: Performed at  [facility], [address], [place],  [address]  CBC with Differential/Platelet     Status: None   Collection Time: 02/06/19 10:06  AM  Result Value Ref Range   WBC 5.3 4.0 - 10.5 K/uL   RBC 5.04 4.22 - 5.81 MIL/uL   Hemoglobin 14.0 13.0 - 17.0 g/dL   HCT 41.9 39.0 - 52.0 %   MCV 83.1 80.0 - 100.0 fL   MCH 27.8 26.0 - 34.0 pg   MCHC 33.4 30.0 - 36.0 g/dL   RDW 15.3 11.5 -  15.5 %   Platelets 212 150 - 400 K/uL   nRBC 0.0 0.0 - 0.2 %   Neutrophils Relative % 47 %   Neutro Abs 2.5 1.7 - 7.7 K/uL   Lymphocytes Relative 34 %   Lymphs Abs 1.8 0.7 - 4.0 K/uL   Monocytes Relative 14 %   Monocytes Absolute 0.7 0.1 - 1.0 K/uL   Eosinophils Relative 4 %   Eosinophils Absolute 0.2 0.0 - 0.5 K/uL   Basophils Relative 1 %   Basophils Absolute 0.0 0.0 - 0.1 K/uL   Immature Granulocytes 0 %   Abs Immature Granulocytes 0.01 0.00 - 0.07 K/uL    Comment: Performed at [facility], [address] [place], [place] [address]  Comprehensive metabolic panel     Status: Abnormal   Collection Time: 03/20/19 10:30 AM  Result Value Ref Range   Sodium 134 (L) 135 - 145 mmol/L   Potassium 4.0 3.5 - 5.1 mmol/L   Chloride 107 98 - 111 mmol/L   CO2 20 (L) 22 - 32 mmol/L   Glucose, Bld 187 (H) 70 - 99 mg/dL   BUN 20 8 - 23 mg/dL   Creatinine, Ser 1.42 (H) 0.61 - 1.24 mg/dL   Calcium 8.8 (L) 8.9 - 10.3 mg/dL   Total Protein 7.4 6.5 - 8.1 g/dL   Albumin 3.5 3.5 - 5.0 g/dL   AST 23 15 - 41 U/L   ALT 45 (H) 0 - 44 U/L   Alkaline Phosphatase 87 38 - 126 U/L   Total Bilirubin 0.6 0.3 - 1.2 mg/dL   GFR calc non Af Amer 51 (L) >60 mL/min   GFR calc Af Amer 60 (L) >60 mL/min   Anion gap 7 5 - 15    Comment: Performed at [facility], [address] [place], [place] [address]  CBC with Differential     Status: None   Collection Time: 03/20/19 10:30 AM  Result Value Ref Range   WBC 5.5 4.0 - 10.5 K/uL   RBC 5.34 4.22 - 5.81 MIL/uL   Hemoglobin 15.0 13.0 - 17.0 g/dL   HCT 44.7 39.0 - 52.0 %   MCV 83.7 80.0 - 100.0 fL   MCH 28.1 26.0 - 34.0 pg   MCHC 33.6 30.0 - 36.0 g/dL   RDW 14.8 11.5 - 15.5 %   Platelets 224 150 - 400 K/uL   nRBC 0.0 0.0 - 0.2 %   Neutrophils Relative % 42 %   Neutro Abs 2.3 1.7 - 7.7 K/uL   Lymphocytes Relative 40 %   Lymphs Abs 2.2 0.7 - 4.0 K/uL   Monocytes Relative 13 %   Monocytes Absolute 0.7 0.1 - 1.0 K/uL   Eosinophils Relative 4  %   Eosinophils Absolute 0.2 0.0 - 0.5 K/uL   Basophils Relative 1 %   Basophils Absolute 0.0 0.0 - 0.1 K/uL   Immature Granulocytes 0 %   Abs Immature Granulocytes 0.01 0.00 - 0.07 K/uL    Comment: Performed at [facility], [place], [place] [address]  POCT HgB A1C  Status: Abnormal   Collection Time: 04/07/19 11:30 AM  Result Value Ref Range   Hemoglobin A1C     HbA1c POC (<> result, manual entry)     HbA1c, POC (prediabetic range)     HbA1c, POC (controlled diabetic range) 9.8 (A) 0.0 - 7.0 %    Diabetic Foot Exam: Diabetic Foot Exam - Simple   Simple Foot Form Visual Inspection See comments: Yes Sensation Testing Intact to touch and monofilament testing bilaterally: Yes Pulse Check Posterior Tibialis and Dorsalis pulse intact bilaterally: Yes Comments Tinea pedis between 4th -5 th toes, also has brittle nails and dry feet       PHQ2/9: Depression screen [facility] 2/9 04/07/2019 12/29/2018 08/22/2018 04/07/2018 01/05/2018  Decreased Interest 0 0 0 0 1  Down, Depressed, Hopeless 0 0 0 0 0  PHQ - 2 Score 0 0 0 0 1  Altered sleeping 0 0 - 0 0  Tired, decreased energy 0 0 - 1 1  Change in appetite 0 0 - 0 1  Feeling bad or failure about yourself  0 0 - 0 -  Trouble concentrating 0 0 - 0 0  Moving slowly or fidgety/restless 0 0 - 0 0  Suicidal thoughts 0 0 - 0 0  PHQ-9 Score 0 0 - 1 3  Difficult doing work/chores Not difficult at all Not difficult at all - Not difficult at all Not difficult at all    phq 9 is negative   Fall Risk: Fall Risk  04/07/2019 12/29/2018 08/22/2018 04/07/2018 01/05/2018  Falls in the past year? 0 0 0 No No  Number falls in past yr: 0 0 - - -  Injury with Fall? 0 0 - - -     Functional Status Survey: Is the patient deaf or have difficulty hearing?: No Does the patient have difficulty seeing, even when wearing glasses/contacts?: No Does the patient have difficulty concentrating, remembering, or making decisions?: No Does the  patient have difficulty walking or climbing stairs?: No Does the patient have difficulty dressing or bathing?: No Does the patient have difficulty doing errands alone such as visiting a doctor's office or shopping?: No   Assessment & Plan  1. Type 2 diabetes mellitus with stage 3 chronic kidney disease, without long-term current use of insulin (HCC)  - POCT HgB A1C - metFORMIN (GLUCOPHAGE) 500 MG tablet; Take 1-2 tablets (500-1,000 mg total) by mouth 2 (two) times daily with a meal.  Dispense: 270 tablet; Refill: 0 - insulin degludec (TRESIBA FLEXTOUCH) 100 UNIT/ML SOPN FlexTouch Pen; Inject 0.1-0.2 mLs (10-20 Units total) into the skin daily. Start at 10 units ( hold glipizide while taking this )  Go up by 2 units every 3 days to keep glucose between 120-150  Dispense: 3 mL; Refill: 0  2. CKD (chronic kidney disease), stage III (HCC)  - insulin degludec (TRESIBA FLEXTOUCH) 100 UNIT/ML SOPN FlexTouch Pen; Inject 0.1-0.2 mLs (10-20 Units total) into the skin daily. Start at 10 units ( hold glipizide while taking this )  Go up by 2 units every 3 days to keep glucose between 120-150  Dispense: 3 mL; Refill: 0  3. Old cerebrovascular accident (CVA) without late effect   4. Hypercholesteremia  Continue statin every other day   5. Chronic deep vein thrombosis (DVT) of left lower extremity, unspecified vein (HCC)  On Xarelto and compression stocking hoses  6. Primary malignant neoplasm of lung metastatic to other site, unspecified laterality [facility])  Under the care of oncologist and radiation  oncologist

## 2019-04-09 NOTE — Telephone Encounter (Signed)
He has metastatic lung cancer with brain metastasis.  Dr. Rogue Bussing recommended holding off on prostate biopsy and I agree.  We can reassess in 4-6 months.  Would go ahead and schedule a follow-up around that time so he is in the system.

## 2019-04-10 ENCOUNTER — Ambulatory Visit
Admission: RE | Admit: 2019-04-10 | Discharge: 2019-04-10 | Disposition: A | Discharge status: Home / Self Care | Payer: Medicare Other | Source: Ambulatory Visit | Attending: Radiation Oncology | Admitting: Radiation Oncology

## 2019-04-10 ENCOUNTER — Other Ambulatory Visit: Payer: Self-pay

## 2019-04-10 DIAGNOSIS — Z510 Encounter for antineoplastic radiation therapy: Secondary | ICD-10-CM | POA: Insufficient documentation

## 2019-04-10 DIAGNOSIS — C7931 Secondary malignant neoplasm of brain: Secondary | ICD-10-CM | POA: Diagnosis not present

## 2019-04-10 DIAGNOSIS — C778 Secondary and unspecified malignant neoplasm of lymph nodes of multiple regions: Secondary | ICD-10-CM | POA: Diagnosis not present

## 2019-04-10 DIAGNOSIS — C3412 Malignant neoplasm of upper lobe, left bronchus or lung: Secondary | ICD-10-CM | POA: Insufficient documentation

## 2019-04-11 ENCOUNTER — Ambulatory Visit
Admission: RE | Admit: 2019-04-11 | Discharge: 2019-04-11 | Disposition: A | Discharge status: Home / Self Care | Payer: Medicare Other | Source: Ambulatory Visit | Attending: Radiation Oncology | Admitting: Radiation Oncology

## 2019-04-11 ENCOUNTER — Other Ambulatory Visit: Payer: Self-pay

## 2019-04-11 DIAGNOSIS — C3412 Malignant neoplasm of upper lobe, left bronchus or lung: Secondary | ICD-10-CM | POA: Diagnosis not present

## 2019-04-11 DIAGNOSIS — C778 Secondary and unspecified malignant neoplasm of lymph nodes of multiple regions: Secondary | ICD-10-CM | POA: Diagnosis not present

## 2019-04-11 DIAGNOSIS — Z510 Encounter for antineoplastic radiation therapy: Secondary | ICD-10-CM | POA: Diagnosis not present

## 2019-04-11 DIAGNOSIS — C7931 Secondary malignant neoplasm of brain: Secondary | ICD-10-CM | POA: Diagnosis not present

## 2019-04-11 NOTE — Telephone Encounter (Signed)
Oral Chemotherapy Pharmacist Encounter  Patient Education I spoke with patient following his 03/22/2019 office visit with Dr. Rogue Bussing for overview of new oral chemotherapy medication: Rozlytrek (entrectinib) for the treatment of ROS+ metastatic lung cancer, planned duration until disease progression or unacceptable drug toxicity.  Counseled patient on administration, dosing, side effects, monitoring, drug-food interactions, safe handling, storage, and disposal. Patient will take 3 capsules (600mg  total) by mouth daily.  Side effects include but not limited to: decrease in hgb/wbc, diarrhea or constipations, fatigue, N/V, taste changes.    Reviewed with patient importance of keeping a medication schedule and plan for any missed doses.  Mr. Azizi voiced understanding and appreciation. All questions answered. Medication handout provided and consent obtained.  Provided patient with [facility] phone number. Patient knows to call the office with questions or concerns. Oral [facility] will continue to follow.  Darl Pikes, PharmD, BCPS, BCOP Hematology/Oncology Clinical Pharmacist [facility]/AP [facility] [phone]

## 2019-04-12 ENCOUNTER — Ambulatory Visit
Admission: RE | Admit: 2019-04-12 | Discharge: 2019-04-12 | Disposition: A | Discharge status: Home / Self Care | Payer: Medicare Other | Source: Ambulatory Visit | Attending: Radiation Oncology | Admitting: Radiation Oncology

## 2019-04-12 ENCOUNTER — Other Ambulatory Visit: Payer: Self-pay

## 2019-04-12 DIAGNOSIS — C778 Secondary and unspecified malignant neoplasm of lymph nodes of multiple regions: Secondary | ICD-10-CM | POA: Diagnosis not present

## 2019-04-12 DIAGNOSIS — C3412 Malignant neoplasm of upper lobe, left bronchus or lung: Secondary | ICD-10-CM | POA: Diagnosis not present

## 2019-04-12 DIAGNOSIS — Z510 Encounter for antineoplastic radiation therapy: Secondary | ICD-10-CM | POA: Diagnosis not present

## 2019-04-12 DIAGNOSIS — C7931 Secondary malignant neoplasm of brain: Secondary | ICD-10-CM | POA: Diagnosis not present

## 2019-04-13 ENCOUNTER — Ambulatory Visit
Admission: RE | Admit: 2019-04-13 | Discharge: 2019-04-13 | Disposition: A | Discharge status: Home / Self Care | Payer: Medicare Other | Source: Ambulatory Visit | Attending: Radiation Oncology | Admitting: Radiation Oncology

## 2019-04-13 ENCOUNTER — Other Ambulatory Visit: Payer: Self-pay

## 2019-04-13 DIAGNOSIS — Z510 Encounter for antineoplastic radiation therapy: Secondary | ICD-10-CM | POA: Diagnosis not present

## 2019-04-13 DIAGNOSIS — C7931 Secondary malignant neoplasm of brain: Secondary | ICD-10-CM | POA: Diagnosis not present

## 2019-04-13 DIAGNOSIS — C3412 Malignant neoplasm of upper lobe, left bronchus or lung: Secondary | ICD-10-CM | POA: Diagnosis not present

## 2019-04-13 DIAGNOSIS — C778 Secondary and unspecified malignant neoplasm of lymph nodes of multiple regions: Secondary | ICD-10-CM | POA: Diagnosis not present

## 2019-04-14 ENCOUNTER — Telehealth: Payer: Self-pay | Admitting: Family Medicine

## 2019-04-14 ENCOUNTER — Ambulatory Visit
Admission: RE | Admit: 2019-04-14 | Discharge: 2019-04-14 | Disposition: A | Discharge status: Home / Self Care | Payer: Medicare Other | Source: Ambulatory Visit | Attending: Radiation Oncology | Admitting: Radiation Oncology

## 2019-04-14 ENCOUNTER — Other Ambulatory Visit: Payer: Self-pay

## 2019-04-14 DIAGNOSIS — C778 Secondary and unspecified malignant neoplasm of lymph nodes of multiple regions: Secondary | ICD-10-CM | POA: Diagnosis not present

## 2019-04-14 DIAGNOSIS — N183 Chronic kidney disease, stage 3 unspecified: Secondary | ICD-10-CM

## 2019-04-14 DIAGNOSIS — E1122 Type 2 diabetes mellitus with diabetic chronic kidney disease: Secondary | ICD-10-CM

## 2019-04-14 DIAGNOSIS — C3412 Malignant neoplasm of upper lobe, left bronchus or lung: Secondary | ICD-10-CM | POA: Diagnosis not present

## 2019-04-14 DIAGNOSIS — C7931 Secondary malignant neoplasm of brain: Secondary | ICD-10-CM | POA: Diagnosis not present

## 2019-04-14 DIAGNOSIS — Z510 Encounter for antineoplastic radiation therapy: Secondary | ICD-10-CM | POA: Diagnosis not present

## 2019-04-14 NOTE — Telephone Encounter (Signed)
Medication: insulin degludec (TRESIBA FLEXTOUCH) 100 UNIT/ML SOPN FlexTouch Pen     Patient is requesting refill.    Pharmacy:  [facility] [address] [place] [phone] (Phone) [phone] (Fax)

## 2019-04-17 ENCOUNTER — Ambulatory Visit: Payer: Medicare Other | Admitting: Internal Medicine

## 2019-04-17 ENCOUNTER — Ambulatory Visit
Admission: RE | Admit: 2019-04-17 | Discharge: 2019-04-17 | Disposition: A | Discharge status: Home / Self Care | Payer: Medicare Other | Source: Ambulatory Visit | Attending: Radiation Oncology | Admitting: Radiation Oncology

## 2019-04-17 ENCOUNTER — Other Ambulatory Visit: Payer: Medicare Other

## 2019-04-17 ENCOUNTER — Other Ambulatory Visit: Payer: Self-pay

## 2019-04-17 DIAGNOSIS — C7931 Secondary malignant neoplasm of brain: Secondary | ICD-10-CM | POA: Diagnosis not present

## 2019-04-17 DIAGNOSIS — Z510 Encounter for antineoplastic radiation therapy: Secondary | ICD-10-CM | POA: Diagnosis not present

## 2019-04-17 DIAGNOSIS — C3412 Malignant neoplasm of upper lobe, left bronchus or lung: Secondary | ICD-10-CM | POA: Diagnosis not present

## 2019-04-17 DIAGNOSIS — C778 Secondary and unspecified malignant neoplasm of lymph nodes of multiple regions: Secondary | ICD-10-CM | POA: Diagnosis not present

## 2019-04-17 NOTE — Telephone Encounter (Signed)
Patient called to request Rx sent to pharmacy for Melvin Willis asking can it be sent to the pharmacy today or can he come in and pick up a sample. He is out of the one given

## 2019-04-18 ENCOUNTER — Telehealth: Payer: Self-pay | Admitting: Family Medicine

## 2019-04-18 ENCOUNTER — Ambulatory Visit
Admission: RE | Admit: 2019-04-18 | Discharge: 2019-04-18 | Disposition: A | Discharge status: Home / Self Care | Payer: Medicare Other | Source: Ambulatory Visit | Attending: Radiation Oncology | Admitting: Radiation Oncology

## 2019-04-18 ENCOUNTER — Other Ambulatory Visit: Payer: Self-pay

## 2019-04-18 DIAGNOSIS — C3412 Malignant neoplasm of upper lobe, left bronchus or lung: Secondary | ICD-10-CM | POA: Diagnosis not present

## 2019-04-18 DIAGNOSIS — C7931 Secondary malignant neoplasm of brain: Secondary | ICD-10-CM | POA: Diagnosis not present

## 2019-04-18 DIAGNOSIS — Z510 Encounter for antineoplastic radiation therapy: Secondary | ICD-10-CM | POA: Diagnosis not present

## 2019-04-18 DIAGNOSIS — C778 Secondary and unspecified malignant neoplasm of lymph nodes of multiple regions: Secondary | ICD-10-CM | POA: Diagnosis not present

## 2019-04-18 DIAGNOSIS — E1122 Type 2 diabetes mellitus with diabetic chronic kidney disease: Secondary | ICD-10-CM

## 2019-04-18 DIAGNOSIS — N183 Chronic kidney disease, stage 3 unspecified: Secondary | ICD-10-CM

## 2019-04-18 NOTE — Telephone Encounter (Signed)
12 units. He is taking it twice once. Once daily and once at bedtime. In the morning after he eats bs is 124-135. When he checks it at night after dinner bs is 274-300.

## 2019-04-19 ENCOUNTER — Ambulatory Visit
Admission: RE | Admit: 2019-04-19 | Discharge: 2019-04-19 | Disposition: A | Discharge status: Home / Self Care | Payer: Medicare Other | Source: Ambulatory Visit | Attending: Radiation Oncology | Admitting: Radiation Oncology

## 2019-04-19 ENCOUNTER — Telehealth: Payer: Self-pay | Admitting: Family Medicine

## 2019-04-19 ENCOUNTER — Other Ambulatory Visit: Payer: Self-pay

## 2019-04-19 DIAGNOSIS — C7931 Secondary malignant neoplasm of brain: Secondary | ICD-10-CM | POA: Diagnosis not present

## 2019-04-19 DIAGNOSIS — C778 Secondary and unspecified malignant neoplasm of lymph nodes of multiple regions: Secondary | ICD-10-CM | POA: Diagnosis not present

## 2019-04-19 DIAGNOSIS — C3412 Malignant neoplasm of upper lobe, left bronchus or lung: Secondary | ICD-10-CM | POA: Diagnosis not present

## 2019-04-19 DIAGNOSIS — Z510 Encounter for antineoplastic radiation therapy: Secondary | ICD-10-CM | POA: Diagnosis not present

## 2019-04-19 MED ORDER — TRESIBA FLEXTOUCH 100 UNIT/ML ~~LOC~~ SOPN: 26.0000 [IU] | PEN_INJECTOR | Freq: Every day | SUBCUTANEOUS | 0 refills | Status: DC

## 2019-04-19 NOTE — Telephone Encounter (Signed)
Pt calling saying that [facility] does not have  insulin degludec (TRESIBA FLEXTOUCH) 100 UNIT/ML SOPN FlexTouch Pen  Please call pt to update  He is out of medication

## 2019-04-19 NOTE — Telephone Encounter (Signed)
Pt is taking 12 units twice daily.   Also pt is out. Pt needs to pick this up from the office today   cb is [phone]

## 2019-04-19 NOTE — Telephone Encounter (Signed)
Spoke with patient and informed him that he can take  26 units in the am and no need to take twice a day per Dr. Ancil Boozer.  Also informed him that Dr. Ancil Boozer stated basal insulin does not work for after meals levels and we may need to add a pre-meal for that but his fasting levels was very close to goal.

## 2019-04-20 ENCOUNTER — Other Ambulatory Visit: Payer: Self-pay

## 2019-04-20 ENCOUNTER — Ambulatory Visit
Admission: RE | Admit: 2019-04-20 | Discharge: 2019-04-20 | Disposition: A | Discharge status: Home / Self Care | Payer: Medicare Other | Source: Ambulatory Visit | Attending: Radiation Oncology | Admitting: Radiation Oncology

## 2019-04-20 ENCOUNTER — Other Ambulatory Visit: Payer: Self-pay | Admitting: Family Medicine

## 2019-04-20 DIAGNOSIS — C3412 Malignant neoplasm of upper lobe, left bronchus or lung: Secondary | ICD-10-CM | POA: Diagnosis not present

## 2019-04-20 DIAGNOSIS — Z510 Encounter for antineoplastic radiation therapy: Secondary | ICD-10-CM | POA: Diagnosis not present

## 2019-04-20 DIAGNOSIS — E1122 Type 2 diabetes mellitus with diabetic chronic kidney disease: Secondary | ICD-10-CM

## 2019-04-20 DIAGNOSIS — C778 Secondary and unspecified malignant neoplasm of lymph nodes of multiple regions: Secondary | ICD-10-CM | POA: Diagnosis not present

## 2019-04-20 DIAGNOSIS — N183 Chronic kidney disease, stage 3 unspecified: Secondary | ICD-10-CM

## 2019-04-20 DIAGNOSIS — C7931 Secondary malignant neoplasm of brain: Secondary | ICD-10-CM | POA: Diagnosis not present

## 2019-04-20 MED ORDER — TRESIBA FLEXTOUCH 100 UNIT/ML ~~LOC~~ SOPN: 26.0000 [IU] | PEN_INJECTOR | Freq: Every day | SUBCUTANEOUS | 0 refills | Status: DC

## 2019-04-20 NOTE — Telephone Encounter (Signed)
As far as I know, ALL insulins are Tier 3 regardless of plan, but I am not as familiar with the supplement plans.   If he is completely out, he can get the [employer] brand) long acting insulin equivalent for about $20-30 or use whatever samples we may have on hand. I can contact him tomorrow about getting into a medication assistance program, but it will take several weeks, it is not an immediate process.   Ruben Reason, PharmD Clinical Pharmacist [facility] [phone]

## 2019-04-20 NOTE — Telephone Encounter (Signed)
Patient is calling back the Tresiba  Is $543. He can not afford that. Can a PA be placed? Or an alternative medication. Please advise CB- [phone]

## 2019-04-20 NOTE — Telephone Encounter (Signed)
Called into [address] [facility]

## 2019-04-20 NOTE — Telephone Encounter (Signed)
CoverMyMeds Information regarding your request Available without authorization.  I believe this is a tier 3 medication so you will have to send him in something different or refer him to the Chronic Care Management team to see if they know any resources that he qualifies for.

## 2019-04-21 ENCOUNTER — Other Ambulatory Visit: Payer: Self-pay

## 2019-04-21 ENCOUNTER — Ambulatory Visit
Admission: RE | Admit: 2019-04-21 | Discharge: 2019-04-21 | Disposition: A | Discharge status: Home / Self Care | Payer: Medicare Other | Source: Ambulatory Visit | Attending: Radiation Oncology | Admitting: Radiation Oncology

## 2019-04-21 ENCOUNTER — Ambulatory Visit: Payer: Self-pay | Admitting: Pharmacist

## 2019-04-21 DIAGNOSIS — C778 Secondary and unspecified malignant neoplasm of lymph nodes of multiple regions: Secondary | ICD-10-CM | POA: Diagnosis not present

## 2019-04-21 DIAGNOSIS — C7931 Secondary malignant neoplasm of brain: Secondary | ICD-10-CM | POA: Diagnosis not present

## 2019-04-21 DIAGNOSIS — C3412 Malignant neoplasm of upper lobe, left bronchus or lung: Secondary | ICD-10-CM | POA: Diagnosis not present

## 2019-04-21 DIAGNOSIS — Z510 Encounter for antineoplastic radiation therapy: Secondary | ICD-10-CM | POA: Diagnosis not present

## 2019-04-21 NOTE — Chronic Care Management (AMB) (Signed)
  Chronic Care Management   Note  04/21/2019 Name: Melvin Willis MRN: [id number] DOB: November 06, 1950  68 y.o. year old male referred to Chronic Care Management by Dr. [name] for  Medication assistnace(Tresiba). Last office visit with [name], MD was 04/07/19.   Was unable to reach patient via telephone today and have left HIPAA compliant voicemail asking patient to return my call. (unsuccessful outreach #1).  Follow up plan: A HIPPA compliant phone message was left for the patient providing contact information and requesting a return call.  The care management team will reach out to the patient again over the next 5-7 days.   Ruben Reason, PharmD Clinical Pharmacist [facility] [phone]

## 2019-04-25 ENCOUNTER — Inpatient Hospital Stay: Payer: Medicare Other | Attending: Oncology

## 2019-04-25 ENCOUNTER — Other Ambulatory Visit: Payer: Self-pay

## 2019-04-25 ENCOUNTER — Encounter: Payer: Self-pay | Admitting: Internal Medicine

## 2019-04-25 ENCOUNTER — Inpatient Hospital Stay (HOSPITAL_BASED_OUTPATIENT_CLINIC_OR_DEPARTMENT_OTHER): Payer: Medicare Other | Admitting: Internal Medicine

## 2019-04-25 VITALS — BP 151/92 | HR 90 | Temp 98.5°F | Wt 167.0 lb

## 2019-04-25 DIAGNOSIS — C7951 Secondary malignant neoplasm of bone: Secondary | ICD-10-CM | POA: Insufficient documentation

## 2019-04-25 DIAGNOSIS — N183 Chronic kidney disease, stage 3 (moderate): Secondary | ICD-10-CM | POA: Insufficient documentation

## 2019-04-25 DIAGNOSIS — Z86718 Personal history of other venous thrombosis and embolism: Secondary | ICD-10-CM | POA: Diagnosis not present

## 2019-04-25 DIAGNOSIS — Z820 Family history of epilepsy and other diseases of the nervous system: Secondary | ICD-10-CM | POA: Insufficient documentation

## 2019-04-25 DIAGNOSIS — C3412 Malignant neoplasm of upper lobe, left bronchus or lung: Secondary | ICD-10-CM

## 2019-04-25 DIAGNOSIS — Z8249 Family history of ischemic heart disease and other diseases of the circulatory system: Secondary | ICD-10-CM | POA: Insufficient documentation

## 2019-04-25 DIAGNOSIS — R5383 Other fatigue: Secondary | ICD-10-CM | POA: Diagnosis not present

## 2019-04-25 DIAGNOSIS — Z86711 Personal history of pulmonary embolism: Secondary | ICD-10-CM | POA: Insufficient documentation

## 2019-04-25 DIAGNOSIS — Z5181 Encounter for therapeutic drug level monitoring: Secondary | ICD-10-CM | POA: Diagnosis not present

## 2019-04-25 DIAGNOSIS — Z7901 Long term (current) use of anticoagulants: Secondary | ICD-10-CM | POA: Insufficient documentation

## 2019-04-25 DIAGNOSIS — Z833 Family history of diabetes mellitus: Secondary | ICD-10-CM | POA: Diagnosis not present

## 2019-04-25 DIAGNOSIS — B370 Candidal stomatitis: Secondary | ICD-10-CM | POA: Insufficient documentation

## 2019-04-25 DIAGNOSIS — C7931 Secondary malignant neoplasm of brain: Secondary | ICD-10-CM | POA: Insufficient documentation

## 2019-04-25 DIAGNOSIS — Z01818 Encounter for other preprocedural examination: Secondary | ICD-10-CM | POA: Diagnosis not present

## 2019-04-25 DIAGNOSIS — Z794 Long term (current) use of insulin: Secondary | ICD-10-CM | POA: Insufficient documentation

## 2019-04-25 DIAGNOSIS — Z923 Personal history of irradiation: Secondary | ICD-10-CM | POA: Diagnosis not present

## 2019-04-25 DIAGNOSIS — Z79899 Other long term (current) drug therapy: Secondary | ICD-10-CM | POA: Diagnosis not present

## 2019-04-25 DIAGNOSIS — E1122 Type 2 diabetes mellitus with diabetic chronic kidney disease: Secondary | ICD-10-CM | POA: Diagnosis not present

## 2019-04-25 DIAGNOSIS — Z8042 Family history of malignant neoplasm of prostate: Secondary | ICD-10-CM | POA: Insufficient documentation

## 2019-04-25 DIAGNOSIS — Z8673 Personal history of transient ischemic attack (TIA), and cerebral infarction without residual deficits: Secondary | ICD-10-CM | POA: Insufficient documentation

## 2019-04-25 LAB — CBC WITH DIFFERENTIAL/PLATELET
Abs Immature Granulocytes: 0.03 10*3/uL (ref 0.00–0.07)
Basophils Absolute: 0 10*3/uL (ref 0.0–0.1)
Basophils Relative: 0 %
Eosinophils Absolute: 0 10*3/uL (ref 0.0–0.5)
Eosinophils Relative: 0 %
HCT: 44.7 % (ref 39.0–52.0)
Hemoglobin: 15.1 g/dL (ref 13.0–17.0)
Immature Granulocytes: 0 %
Lymphocytes Relative: 10 %
Lymphs Abs: 0.8 10*3/uL (ref 0.7–4.0)
MCH: 28.5 pg (ref 26.0–34.0)
MCHC: 33.8 g/dL (ref 30.0–36.0)
MCV: 84.3 fL (ref 80.0–100.0)
Monocytes Absolute: 0.3 10*3/uL (ref 0.1–1.0)
Monocytes Relative: 4 %
Neutro Abs: 6.6 10*3/uL (ref 1.7–7.7)
Neutrophils Relative %: 86 %
Platelets: 198 10*3/uL (ref 150–400)
RBC: 5.3 MIL/uL (ref 4.22–5.81)
RDW: 14.7 % (ref 11.5–15.5)
WBC: 7.7 10*3/uL (ref 4.0–10.5)
nRBC: 0 % (ref 0.0–0.2)

## 2019-04-25 LAB — COMPREHENSIVE METABOLIC PANEL
ALT: 50 U/L — ABNORMAL HIGH (ref 0–44)
AST: 21 U/L (ref 15–41)
Albumin: 3.7 g/dL (ref 3.5–5.0)
Alkaline Phosphatase: 62 U/L (ref 38–126)
Anion gap: 10 (ref 5–15)
BUN: 20 mg/dL (ref 8–23)
CO2: 21 mmol/L — ABNORMAL LOW (ref 22–32)
Calcium: 9.4 mg/dL (ref 8.9–10.3)
Chloride: 102 mmol/L (ref 98–111)
Creatinine, Ser: 1.24 mg/dL (ref 0.61–1.24)
GFR calc Af Amer: >60 mL/min (ref >60)
GFR calc non Af Amer: >60 mL/min (ref >60)
Glucose, Bld: 443 mg/dL — ABNORMAL HIGH (ref 70–99)
Potassium: 4.4 mmol/L (ref 3.5–5.1)
Sodium: 133 mmol/L — ABNORMAL LOW (ref 135–145)
Total Bilirubin: 0.8 mg/dL (ref 0.3–1.2)
Total Protein: 7.1 g/dL (ref 6.5–8.1)

## 2019-04-25 MED ORDER — NYSTATIN 100000 UNIT/ML MT SUSP: 5.0000 mL | Freq: Four times a day (QID) | OROMUCOSAL | 0 refills | Status: DC

## 2019-04-25 NOTE — Progress Notes (Signed)
Pt here for follow up, offers no concerns at this time.

## 2019-04-25 NOTE — Assessment & Plan Note (Addendum)
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  S/p WBRT [finished 8/14]   # Proceed with Entrectinib today.  Today EKG at baseline-no evidence of QT prolongation.  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 Monitor for now.   # CKD-stage III multifactorial [prostatitic obstruction/diabetes]- creatinine 1.4.  Stable.   # Oral thrush-recommend nystatin swish and spit.  # poorly controlled BG- 443- on sliding scale insulin- currently on dex 57m  # Prostate lesions/PSA 6.3--hold work-up for now given brain lesions/progressive disease.  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   # DISPOSITION: # follow up in 2 weeks/[facility]- Dr.B

## 2019-04-25 NOTE — Progress Notes (Signed)
[place] OFFICE PROGRESS NOTE  Patient Care Team: [name], [name], MD as Medical Oncologist (Medical Oncology)  Cancer Staging No matching staging information was found for the patient.   Oncology History Overview Note  # OCT 2017- ADENO CA LUNG; STAGE IV [; LUL; bil supraclavicular LN; Left neck LN Bx]; ROS-1 MUTATED; s/p Carbo-alimta x1[oct 2017]  # NOV 1st 2017- XALKORI 250 mg BID; JAN 15th CT- PR;  # AUG 27th Chemo-RT to persistent LUL/mediastinal LN [s/p carbo-taxol- with RT; finished Sep 22nd 2018];   # July 14th 2020- multiple metastatic lesions of the brain [July 30 whole brain radiation finished April 21, 2019]; bone scan skull metastases /posterior left ninth rib metastases; April 04, 2019 stopped crizotinib;  #April 25, 2019-start Entrectinib 600 mg once a day  # LLE DVT/bil PE/Multiple strokes [? On xarelto]-Lovenox; Jan mid 2018- xarelto [lovenox-insurance issues]  # MRI brain- multiple infarcts [2d echo/bubble study-NEG's/p Neurology eval]  ------------------------------------------------------------    # # s/p TURP [Sep 2017; Dr.Cope] DEC 26th CT- distended bladder   # MOLECULAR TESTING- ROS-1 POSITIVE; ALK/EGFR-NEG; PDL-1 EXPRESSION- 90%** [HIGH] -----------------------------------------------------------------    DIAGNOSIS: Adenocarcinoma the lung  ROS-1 +  STAGE:  IV    ;GOALS: Palliative  CURRENT/MOST RECENT THERAPY-r whole brain radiation   Primary cancer of left upper lobe of lung ([place])      INTERVAL HISTORY:  Melvin Willis 68 y.o.  male pleasant patient above history of metastatic lung cancer-[place]; with newly diagnosed brain metastases is currently status post radiation finished 4 days ago.  Denies any headaches but denies any falls.  No seizures.  He continues with dexamethasone; taper finishing next week.  States that currently on dexamethasone 2 mg a day; for 1  week and then 2 mg every other day.  States his blood sugars are elevated at home.  He is currently on insulin sliding scale.  He has not taken his insulin today.  Complains of fatigue.  Complains of hair loss from radiation.  He does complain of fatigue.  Review of Systems  Constitutional: Positive for malaise/fatigue. Negative for chills, diaphoresis, fever and weight loss.  HENT: Negative for nosebleeds and sore throat.   Eyes: Negative for double vision.  Respiratory: Negative for cough, hemoptysis, sputum production, shortness of breath and wheezing.   Cardiovascular: Positive for leg swelling. Negative for chest pain (Left upper chest wall pain chronic), palpitations and orthopnea.  Gastrointestinal: Negative for abdominal pain, blood in stool, constipation, diarrhea, heartburn and melena.  Genitourinary: Negative for dysuria, frequency and urgency.  Musculoskeletal: Negative for back pain and joint pain.  Skin: Negative.  Negative for itching and rash.  Neurological: Negative for dizziness, tingling, focal weakness and weakness.  Endo/Heme/Allergies: Does not bruise/bleed easily.  Psychiatric/Behavioral: Negative for depression. The patient is not nervous/anxious and does not have insomnia.       PAST MEDICAL HISTORY :  Past Medical History:  Diagnosis Date  . Abnormal prostate specific antigen 08/08/2012  . Adiposity 04/16/2015  . Chronic kidney disease (CKD), stage III (moderate) ([place]) 11/27/2016  . CVA (cerebral vascular accident) ([place]) 06/17/2016  . Diabetes mellitus without complication ([place])   . Diverticulosis of sigmoid colon 04/16/2015  . Dyslipidemia 03/18/2015  . Hemorrhoids, internal 04/16/2015  . Hypercholesteremia 04/16/2015  . Hyperlipidemia   . Hypertension   . Primary cancer of left upper lobe of lung ([place])   . Pulmonary embolism ([place])   . Wears dentures  partial upper    PAST SURGICAL HISTORY :   Past Surgical History:  Procedure Laterality Date  .  COLONOSCOPY    . COLONOSCOPY WITH PROPOFOL N/A 05/06/2015   Procedure: COLONOSCOPY WITH PROPOFOL;  Surgeon: Lucilla Lame, MD;  Location: [place];  Service: Endoscopy;  Laterality: N/A;  ASCENDING COLON POLYPS X 2 TERMINAL ILEUM BIOPSY RANDOM COLON BX. TRANSVERSE COLON POLYP SIGMOID COLON POLYP  . ESOPHAGOGASTRODUODENOSCOPY (EGD) WITH PROPOFOL N/A 05/06/2015   Procedure: ESOPHAGOGASTRODUODENOSCOPY (EGD) WITH PROPOFOL;  Surgeon: Lucilla Lame, MD;  Location: [place];  Service: Endoscopy;  Laterality: N/A;  GASTRIC BIOPSY X1  . KIDNEY STONE SURGERY  2017    FAMILY HISTORY :   Family History  Problem Relation Age of Onset  . Diabetes Mother   . Diabetes Father   . CAD Father   . Dementia Father   . Diabetes Sister   . Cancer Maternal Uncle        Prostate  . Cancer Cousin        prostate    SOCIAL HISTORY:   Social History   Tobacco Use  . Smoking status: Never Smoker  . Smokeless tobacco: Never Used  Substance Use Topics  . Alcohol use: No    Alcohol/week: 0.0 standard drinks  . Drug use: No    ALLERGIES:  has No Known Allergies.  MEDICATIONS:  Current Outpatient Medications  Medication Sig Dispense Refill  . atorvastatin (LIPITOR) 40 MG tablet Take 1 tablet (40 mg total) by mouth 3 (three) times a week. 48 tablet 1  . dexamethasone (DECADRON) 4 MG tablet Take 1 tablet (4 mg total) by mouth daily. 20 tablet 0  . entrectinib (ROZLYTREK) 200 MG capsule Take 3 capsules (600 mg total) by mouth daily. 90 capsule 6  . finasteride (PROSCAR) 5 MG tablet Take 1 tablet (5 mg total) by mouth daily. 90 tablet 3  . insulin degludec (TRESIBA FLEXTOUCH) 100 UNIT/ML SOPN FlexTouch Pen Inject 0.26 mLs (26 Units total) into the skin daily. 15 mL 0  . lisinopril (ZESTRIL) 5 MG tablet Take 1 tablet (5 mg total) by mouth daily. 90 tablet 1  . metFORMIN (GLUCOPHAGE) 500 MG tablet Take 1-2 tablets (500-1,000 mg total) by mouth 2 (two) times daily with a meal. 270 tablet 0   . ondansetron (ZOFRAN) 8 MG tablet One pill every 8 hours as needed for nausea/vomitting. 40 tablet 1  . rivaroxaban (XARELTO) 20 MG TABS tablet Take 1 tablet (20 mg total) by mouth daily with supper. 30 tablet 11  . tamsulosin (FLOMAX) 0.4 MG CAPS capsule Take 1 capsule (0.4 mg total) by mouth every morning. 90 capsule 3  . nystatin (MYCOSTATIN) 100000 UNIT/ML suspension Take 5 mLs (500,000 Units total) by mouth 4 (four) times daily. 60 mL 0   No current facility-administered medications for this visit.     PHYSICAL EXAMINATION: ECOG PERFORMANCE STATUS: 0 - Asymptomatic  BP (!) 151/92   Pulse 90   Temp 98.5 F (36.9 C) (Tympanic)   Wt 167 lb (75.8 kg)   BMI 28.67 kg/m   Filed Weights   04/25/19 1324  Weight: 167 lb (75.8 kg)    Physical Exam  Constitutional: He is oriented to person, place, and time and well-developed, well-nourished, and in no distress.  Alone. He is walking by himself.  HENT:  Head: Normocephalic and atraumatic.  Mouth/Throat: Oropharynx is clear and moist. No oropharyngeal exudate.  Positive for oral thrush.  Eyes: Pupils are equal, round, and reactive  to light.  Neck: Normal range of motion. Neck supple.  Cardiovascular: Normal rate and regular rhythm.  Pulmonary/Chest: No respiratory distress. He has no wheezes.  Abdominal: Soft. Bowel sounds are normal. He exhibits no distension and no mass. There is no abdominal tenderness. There is no rebound and no guarding.  Musculoskeletal: Normal range of motion.        General: No tenderness or edema.  Neurological: He is alert and oriented to person, place, and time.  Skin: Skin is warm.  Psychiatric: Affect normal.       LABORATORY DATA:  I have reviewed the data as listed    Component Value Date/Time   NA 133 (L) 04/25/2019 1307   NA 139 01/01/2016 1000   K 4.4 04/25/2019 1307   CL 102 04/25/2019 1307   CO2 21 (L) 04/25/2019 1307   GLUCOSE 443 (H) 04/25/2019 1307   BUN 20 04/25/2019 1307    BUN 13 01/01/2016 1000   CREATININE 1.24 04/25/2019 1307   CREATININE 1.32 (H) 08/22/2018 1107   CALCIUM 9.4 04/25/2019 1307   PROT 7.1 04/25/2019 1307   PROT 7.2 01/01/2016 1000   ALBUMIN 3.7 04/25/2019 1307   ALBUMIN 4.4 01/01/2016 1000   AST 21 04/25/2019 1307   ALT 50 (H) 04/25/2019 1307   ALKPHOS 62 04/25/2019 1307   BILITOT 0.8 04/25/2019 1307   BILITOT 0.4 01/01/2016 1000   GFRNONAA >60 04/25/2019 1307   GFRNONAA 56 (L) 08/22/2018 1107   GFRAA >60 04/25/2019 1307   GFRAA 65 08/22/2018 1107    No results found for: SPEP, UPEP  Lab Results  Component Value Date   WBC 7.7 04/25/2019   NEUTROABS 6.6 04/25/2019   HGB 15.1 04/25/2019   HCT 44.7 04/25/2019   MCV 84.3 04/25/2019   PLT 198 04/25/2019      Chemistry      Component Value Date/Time   NA 133 (L) 04/25/2019 1307   NA 139 01/01/2016 1000   K 4.4 04/25/2019 1307   CL 102 04/25/2019 1307   CO2 21 (L) 04/25/2019 1307   BUN 20 04/25/2019 1307   BUN 13 01/01/2016 1000   CREATININE 1.24 04/25/2019 1307   CREATININE 1.32 (H) 08/22/2018 1107      Component Value Date/Time   CALCIUM 9.4 04/25/2019 1307   ALKPHOS 62 04/25/2019 1307   AST 21 04/25/2019 1307   ALT 50 (H) 04/25/2019 1307   BILITOT 0.8 04/25/2019 1307   BILITOT 0.4 01/01/2016 1000       RADIOGRAPHIC STUDIES: I have personally reviewed the radiological images as listed and agreed with the findings in the report. No results found.   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  S/p WBRT [finished 8/14]   # Proceed with Entrectinib today.  Today EKG at baseline-no evidence of QT prolongation.  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 Monitor for now.    # CKD-stage III multifactorial [prostatitic obstruction/diabetes]- creatinine 1.4.  Stable.   # Oral thrush-recommend nystatin swish and spit.  # poorly controlled BG- 443- on sliding scale insulin- currently on dex '2mg'$   # Prostate lesions/PSA 6.3--hold work-up for now given brain lesions/progressive disease.  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   # DISPOSITION: # follow up  in 2 weeks/cbc-cmp- Dr.B    Orders Placed This Encounter  Procedures  . EKG 12-Lead    Standing Status:   Future    Number of Occurrences:   1    Standing Expiration Date:   04/24/2020   All questions were answered. The patient knows to call the clinic with any problems, questions or concerns.      Cammie Sickle, MD 04/25/2019 2:01 PM

## 2019-04-27 ENCOUNTER — Telehealth: Payer: Self-pay | Admitting: *Deleted

## 2019-04-27 NOTE — Telephone Encounter (Signed)
Melvin Willis from [employer] called to inquire about the [place] for patient assistance which was sent in July to help patient get his Xarelto. I see a form in [employer] dated 7/15 which is not completed.Please return his call [phone] option 2

## 2019-04-27 NOTE — Telephone Encounter (Signed)
Called [facility] to let them know that I am taking care of the Xarelto assistance application.  Patient still has assistance until 05/23/2019 through [employer].  [employer] have a new completed application on file when it is time for him to renew.    I verified that the patient did not need their services for application assistance and she stated that the patient must call and speak to the cancellation department to stop being charged for their services.  Bethena Roys

## 2019-04-28 ENCOUNTER — Ambulatory Visit: Payer: Self-pay | Admitting: Pharmacist

## 2019-04-28 NOTE — Chronic Care Management (AMB) (Signed)
  Chronic Care Management   Note  04/28/2019 Name: Melvin Willis MRN: [id number] DOB: [birth year]  68 y.o. year old male referred to Chronic Care Management by Dr. [name] for  Medication assistnace(Tresiba). Last office visit with [name], MD was 04/07/19.   Was unable to reach patient via telephone today and have left HIPAA compliant voicemail asking patient to return my call. (unsuccessful outreach #2).  Follow up plan: A HIPPA compliant phone message was left for the patient providing contact information and requesting a return call.  The care management team will reach out to the patient again over the next 5-7 days.   Ruben Reason, PharmD Clinical Pharmacist [facility] [phone]

## 2019-05-05 ENCOUNTER — Ambulatory Visit: Payer: Self-pay | Admitting: Pharmacist

## 2019-05-05 DIAGNOSIS — N183 Chronic kidney disease, stage 3 unspecified: Secondary | ICD-10-CM

## 2019-05-05 DIAGNOSIS — E1122 Type 2 diabetes mellitus with diabetic chronic kidney disease: Secondary | ICD-10-CM

## 2019-05-05 NOTE — Patient Instructions (Signed)
Melvin Willis was given information about Chronic Care Management services today including:  1. CCM service includes personalized support from designated clinical staff supervised by his physician, including individualized plan of care and coordination with other care providers 2. 24/7 contact phone numbers for assistance for urgent and routine care needs. 3. Service will only be billed when office clinical staff spend 20 minutes or more in a month to coordinate care. 4. Only one practitioner may furnish and bill the service in a calendar month. 5. The patient may stop CCM services at any time (effective at the end of the month) by phone call to the office staff. 6. The patient will be responsible for cost sharing (co-pay) of up to 20% of the service fee (after annual deductible is met).  Patient agreed to services and verbal consent obtained.

## 2019-05-05 NOTE — Chronic Care Management (AMB) (Signed)
  Chronic Care Management   Note  05/05/2019 Name: HAZE ANTILLON MRN: [id number] DOB: [birth year]   Vergia Alcon Shelnutt is a 68 y.o. year old male who sees [name], MD for primary care. Dr. Ancil Boozer asked the CCM pharmacist to consult the patient for assistance with chronic disease management related to medication assistance Tyler Aas). Telephone outreach to patient today to introduce CCM services.   Plan: Patient agreed to services and verbal consent obtained. I have scheduled an appointment for [name] next week.    HARSHAAN WHANG was given information about Chronic Care Management services today including:  1. CCM service includes personalized support from designated clinical staff supervised by her physician, including individualized plan of care and coordination with other care providers 2. 24/7 contact phone numbers for assistance for urgent and routine care needs. 3. Service will only be billed when office clinical staff spend 20 minutes or more in a month to coordinate care. 4. Only one practitioner may furnish and bill the service in a calendar month. 5. The patient may stop CCM services at any time (effective at the end of the month) by phone call to the office staff. 6. The patient will be responsible for cost sharing (co-pay) of up to 20% of the service fee (after annual deductible is met).  Patient agreed to services and verbal consent obtained.     Ruben Reason, PharmD Clinical Pharmacist [facility] [phone]

## 2019-05-08 ENCOUNTER — Ambulatory Visit (INDEPENDENT_AMBULATORY_CARE_PROVIDER_SITE_OTHER): Payer: Medicare Other | Admitting: Pharmacist

## 2019-05-08 ENCOUNTER — Other Ambulatory Visit: Payer: Self-pay

## 2019-05-08 DIAGNOSIS — N183 Chronic kidney disease, stage 3 unspecified: Secondary | ICD-10-CM

## 2019-05-08 DIAGNOSIS — E1122 Type 2 diabetes mellitus with diabetic chronic kidney disease: Secondary | ICD-10-CM | POA: Diagnosis not present

## 2019-05-08 MED ORDER — BLOOD GLUCOSE METER KIT: PACK | 0 refills | Status: AC

## 2019-05-08 NOTE — Chronic Care Management (AMB) (Signed)
Chronic Care Management   Follow Up Note   05/08/2019 Name: BLISS TSANG MRN: [id number] DOB: Oct 04, 1950  Subjective Mervyn L Cortopassi is a 68 y.o. year old male who is a primary care patient of [name], MD. The CCM clinical pharmacist was consulted for assistance with chronic disease management and care coordination needs.  Telephone outreach today for initial appointment, HIPAA identifiers verified.   Review of patient status, including review of consultants reports, relevant laboratory and other test results, and collaboration with appropriate care team members and the patient's provider was performed as part of comprehensive patient evaluation and provision of chronic care management services.    SDOH (Social Determinants of Health) screening performed today: None. See Care Plan for related entries.   Outpatient Encounter Medications as of 05/08/2019  Medication Sig  . atorvastatin (LIPITOR) 40 MG tablet Take 1 tablet (40 mg total) by mouth 3 (three) times a week.  . insulin degludec (TRESIBA FLEXTOUCH) 100 UNIT/ML SOPN FlexTouch Pen Inject 0.26 mLs (26 Units total) into the skin daily.  Marland Kitchen lisinopril (ZESTRIL) 5 MG tablet Take 1 tablet (5 mg total) by mouth daily. (Patient taking differently: Take 2.5 mg by mouth daily. )  . metFORMIN (GLUCOPHAGE) 500 MG tablet Take 1-2 tablets (500-1,000 mg total) by mouth 2 (two) times daily with a meal. (Patient taking differently: Take 500-1,000 mg by mouth 2 (two) times daily with a meal. 1 TID)  . dexamethasone (DECADRON) 4 MG tablet Take 1 tablet (4 mg total) by mouth daily. (Patient not taking: Reported on 05/08/2019)  . entrectinib (ROZLYTREK) 200 MG capsule Take 3 capsules (600 mg total) by mouth daily.  . finasteride (PROSCAR) 5 MG tablet Take 1 tablet (5 mg total) by mouth daily.  Marland Kitchen nystatin (MYCOSTATIN) 100000 UNIT/ML suspension Take 5 mLs (500,000 Units total) by mouth 4 (four) times daily. (Patient not taking: Reported on  05/08/2019)  . ondansetron (ZOFRAN) 8 MG tablet One pill every 8 hours as needed for nausea/vomitting.  . rivaroxaban (XARELTO) 20 MG TABS tablet Take 1 tablet (20 mg total) by mouth daily with supper.  . tamsulosin (FLOMAX) 0.4 MG CAPS capsule Take 1 capsule (0.4 mg total) by mouth every morning.   No facility-administered encounter medications on file as of 05/08/2019.      Goals Addressed            This Visit's Progress   . Diabetes (pt-stated)       Current Barriers:  . Dexamethasone for chemotherapy is significantly increasing blood sugars . Financial barriers  Clinical Pharmacist Goal(s):  Over the next 90 days, patient will demonstrate improved adherence to prescribed treatment plan for diabetes self care/management as evidenced by:  . daily monitoring and recording of CBG  . Lowered A1c   Over the next 14 days, Mr.. Champeau will provide the necessary supplementary documents (proof of out of pocket prescription expenditure, proof of household income) needed for medication assistance applications to CCM pharmacist.    Interventions:  . Reviewed medications with patient and discussed importance of medication adherence . Dr. Ancil Boozer to prescribe OneTouch glucometer, strips, and lancets to [facility] on [address]. . Provided patient with Novonordisk application for Tresiba insulin  Patient Self Care Activities:  . Self administers oral medications as prescribed . Self administers insulin as prescribed . Checks blood sugars as prescribed and utilize hyper and hypoglycemia protocol as needed  Initial goal documentation         Telephone follow up appointment with care management  team member scheduled for: 2 weeks   Ruben Reason, PharmD Clinical Pharmacist [facility] [phone]

## 2019-05-08 NOTE — Patient Instructions (Signed)
  Thank you allowing the Chronic Care Management Team to be a part of your care!   Goals Addressed            This Visit's Progress   . Diabetes (pt-stated)       Current Barriers:  . Dexamethasone for chemotherapy is significantly increasing blood sugars . Financial barriers  Clinical Pharmacist Goal(s):  Over the next 90 days, patient will demonstrate improved adherence to prescribed treatment plan for diabetes self care/management as evidenced by:  . daily monitoring and recording of CBG  . Lowered A1c   Over the next 14 days, Melvin Willis will provide the necessary supplementary documents (proof of out of pocket prescription expenditure, proof of household income) needed for medication assistance applications to CCM pharmacist.    Interventions:  . Reviewed medications with patient and discussed importance of medication adherence . Dr. Ancil Boozer to prescribe OneTouch glucometer, strips, and lancets to [facility] on [address]. . Provided patient with Novonordisk application for Tresiba insulin  Patient Self Care Activities:  . Self administers oral medications as prescribed . Self administers insulin as prescribed . Checks blood sugars as prescribed and utilize hyper and hypoglycemia protocol as needed  Initial goal documentation        The patient verbalized understanding of instructions provided today and declined a print copy of patient instruction materials.

## 2019-05-09 ENCOUNTER — Other Ambulatory Visit: Payer: Self-pay

## 2019-05-09 ENCOUNTER — Inpatient Hospital Stay: Payer: Medicare Other | Attending: Internal Medicine

## 2019-05-09 ENCOUNTER — Inpatient Hospital Stay (HOSPITAL_BASED_OUTPATIENT_CLINIC_OR_DEPARTMENT_OTHER): Payer: Medicare Other | Admitting: Internal Medicine

## 2019-05-09 ENCOUNTER — Encounter: Payer: Self-pay | Admitting: Internal Medicine

## 2019-05-09 DIAGNOSIS — N183 Chronic kidney disease, stage 3 (moderate): Secondary | ICD-10-CM | POA: Insufficient documentation

## 2019-05-09 DIAGNOSIS — M7989 Other specified soft tissue disorders: Secondary | ICD-10-CM | POA: Insufficient documentation

## 2019-05-09 DIAGNOSIS — R350 Frequency of micturition: Secondary | ICD-10-CM | POA: Insufficient documentation

## 2019-05-09 DIAGNOSIS — R42 Dizziness and giddiness: Secondary | ICD-10-CM | POA: Diagnosis not present

## 2019-05-09 DIAGNOSIS — Z818 Family history of other mental and behavioral disorders: Secondary | ICD-10-CM | POA: Insufficient documentation

## 2019-05-09 DIAGNOSIS — E1122 Type 2 diabetes mellitus with diabetic chronic kidney disease: Secondary | ICD-10-CM | POA: Insufficient documentation

## 2019-05-09 DIAGNOSIS — Z8249 Family history of ischemic heart disease and other diseases of the circulatory system: Secondary | ICD-10-CM | POA: Diagnosis not present

## 2019-05-09 DIAGNOSIS — I82402 Acute embolism and thrombosis of unspecified deep veins of left lower extremity: Secondary | ICD-10-CM | POA: Diagnosis not present

## 2019-05-09 DIAGNOSIS — C3412 Malignant neoplasm of upper lobe, left bronchus or lung: Secondary | ICD-10-CM | POA: Insufficient documentation

## 2019-05-09 DIAGNOSIS — M2550 Pain in unspecified joint: Secondary | ICD-10-CM | POA: Insufficient documentation

## 2019-05-09 DIAGNOSIS — J069 Acute upper respiratory infection, unspecified: Secondary | ICD-10-CM | POA: Insufficient documentation

## 2019-05-09 DIAGNOSIS — Z833 Family history of diabetes mellitus: Secondary | ICD-10-CM | POA: Diagnosis not present

## 2019-05-09 DIAGNOSIS — Z923 Personal history of irradiation: Secondary | ICD-10-CM | POA: Diagnosis not present

## 2019-05-09 DIAGNOSIS — C7931 Secondary malignant neoplasm of brain: Secondary | ICD-10-CM | POA: Insufficient documentation

## 2019-05-09 DIAGNOSIS — Z86718 Personal history of other venous thrombosis and embolism: Secondary | ICD-10-CM | POA: Diagnosis not present

## 2019-05-09 DIAGNOSIS — Z86711 Personal history of pulmonary embolism: Secondary | ICD-10-CM | POA: Diagnosis not present

## 2019-05-09 DIAGNOSIS — Z79899 Other long term (current) drug therapy: Secondary | ICD-10-CM | POA: Diagnosis not present

## 2019-05-09 DIAGNOSIS — Z794 Long term (current) use of insulin: Secondary | ICD-10-CM | POA: Insufficient documentation

## 2019-05-09 DIAGNOSIS — Z7901 Long term (current) use of anticoagulants: Secondary | ICD-10-CM | POA: Diagnosis not present

## 2019-05-09 DIAGNOSIS — R5383 Other fatigue: Secondary | ICD-10-CM | POA: Insufficient documentation

## 2019-05-09 DIAGNOSIS — Z8042 Family history of malignant neoplasm of prostate: Secondary | ICD-10-CM | POA: Insufficient documentation

## 2019-05-09 DIAGNOSIS — Z8673 Personal history of transient ischemic attack (TIA), and cerebral infarction without residual deficits: Secondary | ICD-10-CM | POA: Insufficient documentation

## 2019-05-09 DIAGNOSIS — R3915 Urgency of urination: Secondary | ICD-10-CM | POA: Insufficient documentation

## 2019-05-09 DIAGNOSIS — C7951 Secondary malignant neoplasm of bone: Secondary | ICD-10-CM | POA: Diagnosis not present

## 2019-05-09 DIAGNOSIS — Z8719 Personal history of other diseases of the digestive system: Secondary | ICD-10-CM | POA: Diagnosis not present

## 2019-05-09 LAB — CBC WITH DIFFERENTIAL/PLATELET
Abs Immature Granulocytes: 0.02 10*3/uL (ref 0.00–0.07)
Basophils Absolute: 0 10*3/uL (ref 0.0–0.1)
Basophils Relative: 1 %
Eosinophils Absolute: 0.1 10*3/uL (ref 0.0–0.5)
Eosinophils Relative: 3 %
HCT: 43.4 % (ref 39.0–52.0)
Hemoglobin: 14.2 g/dL (ref 13.0–17.0)
Immature Granulocytes: 0 %
Lymphocytes Relative: 27 %
Lymphs Abs: 1.4 10*3/uL (ref 0.7–4.0)
MCH: 28.2 pg (ref 26.0–34.0)
MCHC: 32.7 g/dL (ref 30.0–36.0)
MCV: 86.1 fL (ref 80.0–100.0)
Monocytes Absolute: 0.7 10*3/uL (ref 0.1–1.0)
Monocytes Relative: 14 %
Neutro Abs: 3 10*3/uL (ref 1.7–7.7)
Neutrophils Relative %: 55 %
Platelets: 218 10*3/uL (ref 150–400)
RBC: 5.04 MIL/uL (ref 4.22–5.81)
RDW: 16.2 % — ABNORMAL HIGH (ref 11.5–15.5)
WBC: 5.3 10*3/uL (ref 4.0–10.5)
nRBC: 0 % (ref 0.0–0.2)

## 2019-05-09 LAB — COMPREHENSIVE METABOLIC PANEL
ALT: 40 U/L (ref 0–44)
AST: 26 U/L (ref 15–41)
Albumin: 3.7 g/dL (ref 3.5–5.0)
Alkaline Phosphatase: 50 U/L (ref 38–126)
Anion gap: 6 (ref 5–15)
BUN: 18 mg/dL (ref 8–23)
CO2: 27 mmol/L (ref 22–32)
Calcium: 8.9 mg/dL (ref 8.9–10.3)
Chloride: 105 mmol/L (ref 98–111)
Creatinine, Ser: 1.73 mg/dL — ABNORMAL HIGH (ref 0.61–1.24)
GFR calc Af Amer: 47 mL/min — ABNORMAL LOW (ref >60)
GFR calc non Af Amer: 41 mL/min — ABNORMAL LOW (ref >60)
Glucose, Bld: 106 mg/dL — ABNORMAL HIGH (ref 70–99)
Potassium: 4.1 mmol/L (ref 3.5–5.1)
Sodium: 138 mmol/L (ref 135–145)
Total Bilirubin: 0.8 mg/dL (ref 0.3–1.2)
Total Protein: 6.7 g/dL (ref 6.5–8.1)

## 2019-05-09 NOTE — Progress Notes (Signed)
Melvin Willis OFFICE PROGRESS NOTE  Patient Care Team: [name], [name], MD as Medical Oncologist (Medical Oncology) Cathi Roan, [facility] (Pharmacist)  Cancer Staging No matching staging information was found for the patient.   Oncology History Overview Note  # OCT 2017- ADENO CA LUNG; STAGE IV [; LUL; bil supraclavicular LN; Left neck LN Bx]; ROS-1 MUTATED; s/p Carbo-alimta x1[oct 2017]  # NOV 1st 2017- XALKORI 250 mg BID; JAN 15th CT- PR;  # AUG 27th Chemo-RT to persistent LUL/mediastinal LN [s/p carbo-taxol- with RT; finished Sep 22nd 2018];   # July 14th 2020- multiple metastatic lesions of the brain [July 30 whole brain radiation finished April 21, 2019]; bone scan skull metastases /posterior left ninth rib metastases; April 04, 2019 stopped crizotinib;  #April 25, 2019-start Entrectinib 600 mg once a day  # LLE DVT/bil PE/Multiple strokes [? 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  male pleasant patient above history of metastatic lung cancer-[place]; with newly diagnosed brain metastases is currently status post radiation 2 to 3 weeks ago.  Patient currently started Entrectinib 3 pills a day about 2 weeks ago.  Noted to have mild worsening joint pains; also noted to have  dizziness especially on standing.  No falls.  Mild to moderate fatigue.  Is currently off steroids.  Difficulty swallowing improved.  Also complains of difficulty in urination-with incomplete emptying.  However has not been compliant with Flomax/Proscar.  Review of Systems  Constitutional: Positive for malaise/fatigue. Negative for chills, diaphoresis, fever and weight loss.  HENT: Negative for nosebleeds and sore throat.   Eyes: Negative for double vision.  Respiratory: Negative for cough, hemoptysis, sputum production, shortness of breath and wheezing.   Cardiovascular: Positive for leg swelling. Negative for chest pain (Left upper chest wall pain chronic), palpitations and orthopnea.  Gastrointestinal: Negative for abdominal pain, blood in stool, constipation, diarrhea, heartburn and melena.  Genitourinary: Positive for frequency and urgency. Negative for dysuria.  Musculoskeletal: Negative for back pain and joint pain.  Skin: Negative.  Negative for itching and rash.  Neurological: Positive for dizziness. Negative for tingling, focal weakness and weakness.  Endo/Heme/Allergies: Does not bruise/bleed easily.  Psychiatric/Behavioral: Negative for depression. The patient is not nervous/anxious and does not have insomnia.       PAST MEDICAL HISTORY :  Past Medical History:  Diagnosis Date  . Abnormal prostate specific antigen 08/08/2012  . Adiposity 04/16/2015  . Chronic kidney disease (CKD), stage III (moderate) ([place]) 11/27/2016  . CVA (cerebral vascular accident) ([place]) 06/17/2016  . Diabetes mellitus without complication ([place])   . Diverticulosis of sigmoid colon 04/16/2015  . Dyslipidemia 03/18/2015  . Hemorrhoids, internal 04/16/2015  . Hypercholesteremia 04/16/2015  . Hyperlipidemia   . Hypertension   . Primary cancer of left upper lobe of lung ([place])   . Pulmonary embolism ([place])   . Wears dentures    partial upper    PAST  SURGICAL HISTORY :   Past Surgical History:  Procedure  Laterality Date  . COLONOSCOPY    . COLONOSCOPY WITH PROPOFOL N/A 05/06/2015   Procedure: COLONOSCOPY WITH PROPOFOL;  Surgeon: Lucilla Lame, MD;  Location: [facility];  Service: Endoscopy;  Laterality: N/A;  ASCENDING COLON POLYPS X 2 TERMINAL ILEUM BIOPSY RANDOM COLON BX. TRANSVERSE COLON POLYP SIGMOID COLON POLYP  . ESOPHAGOGASTRODUODENOSCOPY (EGD) WITH PROPOFOL N/A 05/06/2015   Procedure: ESOPHAGOGASTRODUODENOSCOPY (EGD) WITH PROPOFOL;  Surgeon: Lucilla Lame, MD;  Location: [facility];  Service: Endoscopy;  Laterality: N/A;  GASTRIC BIOPSY X1  . KIDNEY STONE SURGERY  2017    FAMILY HISTORY :   Family History  Problem Relation Age of Onset  . Diabetes Mother   . Diabetes Father   . CAD Father   . Dementia Father   . Diabetes Sister   . Cancer Maternal Uncle        Prostate  . Cancer Cousin        prostate    SOCIAL HISTORY:   Social History   Tobacco Use  . Smoking status: Never Smoker  . Smokeless tobacco: Never Used  Substance Use Topics  . Alcohol use: No    Alcohol/week: 0.0 standard drinks  . Drug use: No    ALLERGIES:  has No Known Allergies.  MEDICATIONS:  Current Outpatient Medications  Medication Sig Dispense Refill  . atorvastatin (LIPITOR) 40 MG tablet Take 1 tablet (40 mg total) by mouth 3 (three) times a week. 48 tablet 1  . entrectinib (ROZLYTREK) 200 MG capsule Take 3 capsules (600 mg total) by mouth daily. 90 capsule 6  . finasteride (PROSCAR) 5 MG tablet Take 1 tablet (5 mg total) by mouth daily. 90 tablet 3  . insulin degludec (TRESIBA FLEXTOUCH) 100 UNIT/ML SOPN FlexTouch Pen Inject 0.26 mLs (26 Units total) into the skin daily. 15 mL 0  . lisinopril (ZESTRIL) 5 MG tablet Take 1 tablet (5 mg total) by mouth daily. (Patient taking differently: Take 2.5 mg by mouth daily. ) 90 tablet 1  . metFORMIN (GLUCOPHAGE) 500 MG tablet Take 1-2 tablets (500-1,000 mg total) by mouth 2 (two) times daily with a meal. (Patient taking differently:  Take 500-1,000 mg by mouth 2 (two) times daily with a meal. 1 TID) 270 tablet 0  . ondansetron (ZOFRAN) 8 MG tablet One pill every 8 hours as needed for nausea/vomitting. 40 tablet 1  . rivaroxaban (XARELTO) 20 MG TABS tablet Take 1 tablet (20 mg total) by mouth daily with supper. 30 tablet 11  . tamsulosin (FLOMAX) 0.4 MG CAPS capsule Take 1 capsule (0.4 mg total) by mouth every morning. 90 capsule 3  . blood glucose meter kit and supplies Dispense based on patient and insurance preference (Accuchek Aviva Plus, Accuchek Nano). Use up to four times daily as directed. (FOR ICD-10 E10.9, E11.9). 1 each 0  . dexamethasone (DECADRON) 4 MG tablet Take 1 tablet (4 mg total) by mouth daily. (Patient not taking: Reported on 05/08/2019) 20 tablet 0  . nystatin (MYCOSTATIN) 100000 UNIT/ML suspension Take 5 mLs (500,000 Units total) by mouth 4 (four) times daily. (Patient not taking: Reported on 05/08/2019) 60 mL 0   No current facility-administered medications for this visit.     PHYSICAL EXAMINATION: ECOG PERFORMANCE STATUS: 0 - Asymptomatic  BP 127/82 (BP Location: Left Arm, Patient Position: Sitting, Cuff Size: Normal)   Pulse 77   Temp 97.7 F (36.5 C) (Tympanic)   Resp 16   Wt 168  lb (76.2 kg)   BMI 28.84 kg/m   Filed Weights   05/08/19 1435  Weight: 168 lb (76.2 kg)    Physical Exam  Constitutional: He is oriented to person, place, and time and well-developed, well-nourished, and in no distress.  Alone. He is walking by himself.  HENT:  Head: Normocephalic and atraumatic.  Mouth/Throat: Oropharynx is clear and moist. No oropharyngeal exudate.  Positive for oral thrush.  Eyes: Pupils are equal, round, and reactive to light.  Neck: Normal range of motion. Neck supple.  Cardiovascular: Normal rate and regular rhythm.  Pulmonary/Chest: No respiratory distress. He has no wheezes.  Abdominal: Soft. Bowel sounds are normal. He exhibits no distension and no mass. There is no abdominal  tenderness. There is no rebound and no guarding.  Musculoskeletal: Normal range of motion.        General: No tenderness or edema.  Neurological: He is alert and oriented to person, place, and time.  Skin: Skin is warm.  Psychiatric: Affect normal.       LABORATORY DATA:  I have reviewed the data as listed    Component Value Date/Time   NA 138 05/09/2019 0959   NA 139 01/01/2016 1000   K 4.1 05/09/2019 0959   CL 105 05/09/2019 0959   CO2 27 05/09/2019 0959   GLUCOSE 106 (H) 05/09/2019 0959   BUN 18 05/09/2019 0959   BUN 13 01/01/2016 1000   CREATININE 1.73 (H) 05/09/2019 0959   CREATININE 1.32 (H) 08/22/2018 1107   CALCIUM 8.9 05/09/2019 0959   PROT 6.7 05/09/2019 0959   PROT 7.2 01/01/2016 1000   ALBUMIN 3.7 05/09/2019 0959   ALBUMIN 4.4 01/01/2016 1000   AST 26 05/09/2019 0959   ALT 40 05/09/2019 0959   ALKPHOS 50 05/09/2019 0959   BILITOT 0.8 05/09/2019 0959   BILITOT 0.4 01/01/2016 1000   GFRNONAA 41 (L) 05/09/2019 0959   GFRNONAA 56 (L) 08/22/2018 1107   GFRAA 47 (L) 05/09/2019 0959   GFRAA 65 08/22/2018 1107    No results found for: SPEP, UPEP  Lab Results  Component Value Date   WBC 5.3 05/09/2019   NEUTROABS 3.0 05/09/2019   HGB 14.2 05/09/2019   HCT 43.4 05/09/2019   MCV 86.1 05/09/2019   PLT 218 05/09/2019      Chemistry      Component Value Date/Time   NA 138 05/09/2019 0959   NA 139 01/01/2016 1000   K 4.1 05/09/2019 0959   CL 105 05/09/2019 0959   CO2 27 05/09/2019 0959   BUN 18 05/09/2019 0959   BUN 13 01/01/2016 1000   CREATININE 1.73 (H) 05/09/2019 0959   CREATININE 1.32 (H) 08/22/2018 1107      Component Value Date/Time   CALCIUM 8.9 05/09/2019 0959   ALKPHOS 50 05/09/2019 0959   AST 26 05/09/2019 0959   ALT 40 05/09/2019 0959   BILITOT 0.8 05/09/2019 0959   BILITOT 0.4 01/01/2016 1000       RADIOGRAPHIC STUDIES: I have personally reviewed the radiological images as listed and agreed with the findings in the report. No  results found.   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  S/p WBRT [finished 8/14]; currently on Entrectinib.  # Continue Entrectinib- Tolerating well except mild joint pains/fatigued/ dizzyness.   #  CKD-stage III multifactorial [prostatitic obstruction/diabetes]- worse; creatinine 1.76. recommend compliance with Finastrisde/flomax every day.  If worse recommend follow-up with Dr. Bernardo Heater.  # poorly controlled BG- 443- on sliding scale insulin--improved. Off steroids.  Thrush improved.  # Prostate lesions/PSA 6.3--hold work-up for now given brain lesions/progressive disease.  # Bilateral PE & left lower extremity DVT:  on Xarelto; stable.   # DISPOSITION: # follow up in 3 weeks-MD/cbc-cmp- Dr.B    No orders of the defined types were placed in this encounter.  All questions were answered. The patient knows to call the clinic with any problems, questions or concerns.      Cammie Sickle, MD 05/09/2019 10:36 AM

## 2019-05-09 NOTE — Assessment & Plan Note (Addendum)
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  S/p WBRT [finished 8/14]; currently on Entrectinib.  # Continue Entrectinib- Tolerating well except mild joint pains/fatigued/ dizzyness.   # CKD-stage III multifactorial [prostatitic obstruction/diabetes]- worse; creatinine 1.76. recommend compliance with Finastrisde/flomax every day.  If worse recommend follow-up with Dr. Stoioff.  # poorly controlled BG- 443- on sliding scale insulin--improved. Off steroids.  Thrush improved.  # Prostate lesions/PSA 6.3--hold work-up for now given brain lesions/progressive disease.  # Bilateral PE & left lower extremity DVT:  on Xarelto; stable.   # DISPOSITION: # follow up in 3 weeks-MD/cbc-cmp- Dr.B  

## 2019-05-16 ENCOUNTER — Telehealth: Payer: Self-pay | Admitting: Family Medicine

## 2019-05-16 NOTE — Telephone Encounter (Signed)
Called patient sample is waiting for him to pickup.

## 2019-05-16 NOTE — Telephone Encounter (Signed)
Pt wants to know if there is any insulin degludec (TRESIBA FLEXTOUCH) 100 UNIT/ML SOPN FlexTouch Pen at the office that he can have as a sample.

## 2019-05-22 ENCOUNTER — Ambulatory Visit
Admission: RE | Admit: 2019-05-22 | Discharge: 2019-05-22 | Disposition: A | Discharge status: Home / Self Care | Payer: Medicare Other | Source: Ambulatory Visit | Attending: Radiation Oncology | Admitting: Radiation Oncology

## 2019-05-22 ENCOUNTER — Ambulatory Visit (INDEPENDENT_AMBULATORY_CARE_PROVIDER_SITE_OTHER): Payer: Medicare Other | Admitting: Pharmacist

## 2019-05-22 ENCOUNTER — Telehealth: Payer: Self-pay | Admitting: *Deleted

## 2019-05-22 ENCOUNTER — Other Ambulatory Visit: Payer: Self-pay

## 2019-05-22 ENCOUNTER — Other Ambulatory Visit: Payer: Self-pay | Admitting: Internal Medicine

## 2019-05-22 ENCOUNTER — Telehealth: Payer: Self-pay

## 2019-05-22 ENCOUNTER — Encounter: Payer: Self-pay | Admitting: Radiation Oncology

## 2019-05-22 VITALS — BP 139/83 | HR 82 | Temp 97.4°F | Resp 18 | Wt 169.3 lb

## 2019-05-22 DIAGNOSIS — Z923 Personal history of irradiation: Secondary | ICD-10-CM | POA: Diagnosis not present

## 2019-05-22 DIAGNOSIS — N183 Chronic kidney disease, stage 3 unspecified: Secondary | ICD-10-CM

## 2019-05-22 DIAGNOSIS — C3412 Malignant neoplasm of upper lobe, left bronchus or lung: Secondary | ICD-10-CM

## 2019-05-22 DIAGNOSIS — C7951 Secondary malignant neoplasm of bone: Secondary | ICD-10-CM | POA: Insufficient documentation

## 2019-05-22 DIAGNOSIS — R0981 Nasal congestion: Secondary | ICD-10-CM | POA: Insufficient documentation

## 2019-05-22 DIAGNOSIS — E1122 Type 2 diabetes mellitus with diabetic chronic kidney disease: Secondary | ICD-10-CM

## 2019-05-22 DIAGNOSIS — I2699 Other pulmonary embolism without acute cor pulmonale: Secondary | ICD-10-CM

## 2019-05-22 DIAGNOSIS — C7931 Secondary malignant neoplasm of brain: Secondary | ICD-10-CM | POA: Diagnosis not present

## 2019-05-22 NOTE — Progress Notes (Signed)
Radiation Oncology Follow up Note  Name: Melvin Willis   Date:   05/22/2019 MRN:  [id number] DOB: September 05, 1951    This 68 y.o. male presents to the clinic today for 1 month follow-up status post whole brain radiation therapy for stage IV adenocarcinoma of the lung previously treated to his chest back in November 2018.  REFERRING PROVIDER: [name], MD  HPI: Patient is a 68 year old male now at 1 month having completed whole brain radiation therapy for stage IV adenocarcinoma the left upper lobe.  He initially had bilateral supraclavicular involvement received carbotaxol with radiation therapy in 2018.Marland Kitchen  MRI scan of the brain showed extensive metastatic disease.  He is seen today 1 month out from whole brain radiation.  He is doing fairly well he states he is somewhat dizzy at times.  Patient also has known bone metastasis by bone scan criteria.  Patient is currently undergoing immunotherapy withEntrectinib and tolerating that well.  COMPLICATIONS OF TREATMENT: none  FOLLOW UP COMPLIANCE: keeps appointments   PHYSICAL EXAM:  BP 139/83   Pulse 82   Temp (!) 97.4 F (36.3 C)   Resp 18   Wt 169 lb 4.8 oz (76.8 kg)   BMI 29.06 kg/m  Crude visual fields within normal range motor or sensory and DTR levels are equal symmetric in the upper lower extremities.  Well-developed well-nourished patient in NAD. HEENT reveals PERLA, EOMI, discs not visualized.  Oral cavity is clear. No oral mucosal lesions are identified. Neck is clear without evidence of cervical or supraclavicular adenopathy. Lungs are clear to A&P. Cardiac examination is essentially unremarkable with regular rate and rhythm without murmur rub or thrill. Abdomen is benign with no organomegaly or masses noted. Motor sensory and DTR levels are equal and symmetric in the upper and lower extremities. Cranial nerves II through XII are grossly intact. Proprioception is intact. No peripheral adenopathy or edema is identified. No motor  or sensory levels are noted. Crude visual fields are within normal range.  RADIOLOGY RESULTS: No current films to review  PLAN: Present time patient is recovering well from his whole brain radiation.  He does complain of some congestion I am starting him on some decongestant with Sudafed.  Otherwise I am turning follow-up care over to medical oncology.  I be happy to reevaluate the patient anytime should further palliative treatment be indicated.  I would like to take this opportunity to thank you for allowing me to participate in the care of your patient.Noreene Filbert, MD

## 2019-05-22 NOTE — Telephone Encounter (Signed)
Patient reports hearing loss. Pt states that he was told by Dr. Massie Maroon to take sudafed OTC. Patient reports dizziness when changing positions from sitting to standing. This is much different than before. Patient denies any headache, nausea or vomiting. Patient states that he has a BP machine at home. bp 123/80 sitting today.

## 2019-05-22 NOTE — Telephone Encounter (Signed)
Patient called requesting a return call from [facility] stating "I need to discuss a deal with Nira Conn" [phone]

## 2019-05-23 NOTE — Telephone Encounter (Signed)
Left vm requesting patient to call back to f/u on his phone call from yesterday.

## 2019-05-23 NOTE — Chronic Care Management (AMB) (Signed)
Chronic Care Management   Follow Up Note   05/23/2019 Name: Melvin Willis MRN: [id number] DOB: March 09, 1951  Subjective Melvin Willis is a 68 y.o. year old male who is a primary care patient of [name], MD. The CCM clinical pharmacist following up today for patient's progression of CCM goals. HIPAA identifiers verified.    Assessment Review of patient status, including review of consultants reports, relevant laboratory and other test results, and collaboration with appropriate care team members and the patient's provider was performed as part of comprehensive patient evaluation and provision of chronic care management services.    Outpatient Encounter Medications as of 05/22/2019  Medication Sig  . atorvastatin (LIPITOR) 40 MG tablet Take 1 tablet (40 mg total) by mouth 3 (three) times a week.  . blood glucose meter kit and supplies Dispense based on patient and insurance preference (Accuchek Aviva Plus, Accuchek Nano). Use up to four times daily as directed. (FOR ICD-10 E10.9, E11.9).  Marland Kitchen dexamethasone (DECADRON) 4 MG tablet Take 1 tablet (4 mg total) by mouth daily. (Patient not taking: Reported on 05/08/2019)  . entrectinib (ROZLYTREK) 200 MG capsule Take 3 capsules (600 mg total) by mouth daily.  . finasteride (PROSCAR) 5 MG tablet Take 1 tablet (5 mg total) by mouth daily.  . insulin degludec (TRESIBA FLEXTOUCH) 100 UNIT/ML SOPN FlexTouch Pen Inject 0.26 mLs (26 Units total) into the skin daily.  Marland Kitchen lisinopril (ZESTRIL) 5 MG tablet Take 1 tablet (5 mg total) by mouth daily. (Patient taking differently: Take 2.5 mg by mouth daily. )  . metFORMIN (GLUCOPHAGE) 500 MG tablet Take 1-2 tablets (500-1,000 mg total) by mouth 2 (two) times daily with a meal. (Patient taking differently: Take 500-1,000 mg by mouth 2 (two) times daily with a meal. 1 TID)  . nystatin (MYCOSTATIN) 100000 UNIT/ML suspension Take 5 mLs (500,000 Units total) by mouth 4 (four) times daily. (Patient not  taking: Reported on 05/08/2019)  . ondansetron (ZOFRAN) 8 MG tablet One pill every 8 hours as needed for nausea/vomitting.  . tamsulosin (FLOMAX) 0.4 MG CAPS capsule Take 1 capsule (0.4 mg total) by mouth every morning.  . [DISCONTINUED] rivaroxaban (XARELTO) 20 MG TABS tablet Take 1 tablet (20 mg total) by mouth daily with supper.   No facility-administered encounter medications on file as of 05/22/2019.      Goals Addressed            This Visit's Progress   . Diabetes (pt-stated)       Current Barriers:  . Dexamethasone for chemotherapy is significantly increasing blood sugars . Financial barriers  Clinical Pharmacist Goal(s):  Over the next 90 days, patient will demonstrate improved adherence to prescribed treatment plan for diabetes self care/management as evidenced by:  . daily monitoring and recording of CBG  . Lowered A1c   Over the next 14 days, Melvin Willis will provide the necessary supplementary documents (proof of out of pocket prescription expenditure, proof of household income) needed for medication assistance applications to CCM pharmacist.    Interventions:  . Reviewed medications with patient and discussed importance of medication adherence . Dr. Ancil Boozer to prescribe OneTouch glucometer, strips, and lancets to [facility] on [address]. . Provided patient with Novonordisk application for Tresiba insulin o Updated 9/15: reviewed proper documents needed, reviewed availability of sample  Patient Self Care Activities:  . Self administers oral medications as prescribed . Self administers insulin as prescribed . Checks blood sugars as prescribed and utilize hyper and hypoglycemia protocol as needed  Please see past updates related to this goal by clicking on the "Past Updates" button in the selected goal          Telephone follow up appointment with care management team member scheduled for: 2 weeks with PharmD for global DM goal assessment   Ruben Reason,  PharmD Clinical Pharmacist [place] [phone]

## 2019-05-23 NOTE — Telephone Encounter (Signed)
Heather-please inform patient to stop taking Entrectinib [lung cancer pill] x for the next 1 week.-To see if that would improve his dizzy spells.  I could reevaluate/restart the medication at the next visit planned in 1 week.  However if dizzy spells does not improve even after stopping the pills-I would recommend follow-up/symptom management Thursday or Friday of this week.

## 2019-05-24 NOTE — Telephone Encounter (Signed)
2nd call attempt. No answer. Left vm for patient to return our phone call asap

## 2019-05-25 NOTE — Telephone Encounter (Signed)
Spoke with patient. He will stop the entrectinib. He declines an apt with [facility] at this time. He will keep his apt as sch. With Dr. Jacinto Reap on Tuesday.

## 2019-05-25 NOTE — Telephone Encounter (Signed)
3rd call attempt [phone] today. No answer. Left vm for patient to return my phone call.

## 2019-05-30 ENCOUNTER — Inpatient Hospital Stay: Payer: Medicare Other

## 2019-05-30 ENCOUNTER — Inpatient Hospital Stay (HOSPITAL_BASED_OUTPATIENT_CLINIC_OR_DEPARTMENT_OTHER): Payer: Medicare Other | Admitting: Internal Medicine

## 2019-05-30 ENCOUNTER — Encounter: Payer: Self-pay | Admitting: Internal Medicine

## 2019-05-30 ENCOUNTER — Other Ambulatory Visit: Payer: Self-pay

## 2019-05-30 DIAGNOSIS — J069 Acute upper respiratory infection, unspecified: Secondary | ICD-10-CM | POA: Diagnosis not present

## 2019-05-30 DIAGNOSIS — C7951 Secondary malignant neoplasm of bone: Secondary | ICD-10-CM | POA: Diagnosis not present

## 2019-05-30 DIAGNOSIS — C7931 Secondary malignant neoplasm of brain: Secondary | ICD-10-CM | POA: Diagnosis not present

## 2019-05-30 DIAGNOSIS — C3412 Malignant neoplasm of upper lobe, left bronchus or lung: Secondary | ICD-10-CM

## 2019-05-30 DIAGNOSIS — N183 Chronic kidney disease, stage 3 (moderate): Secondary | ICD-10-CM | POA: Diagnosis not present

## 2019-05-30 DIAGNOSIS — R42 Dizziness and giddiness: Secondary | ICD-10-CM | POA: Diagnosis not present

## 2019-05-30 LAB — CBC WITH DIFFERENTIAL/PLATELET
Abs Immature Granulocytes: 0.01 10*3/uL (ref 0.00–0.07)
Basophils Absolute: 0 10*3/uL (ref 0.0–0.1)
Basophils Relative: 0 %
Eosinophils Absolute: 0.1 10*3/uL (ref 0.0–0.5)
Eosinophils Relative: 1 %
HCT: 41.7 % (ref 39.0–52.0)
Hemoglobin: 13.8 g/dL (ref 13.0–17.0)
Immature Granulocytes: 0 %
Lymphocytes Relative: 29 %
Lymphs Abs: 1.4 10*3/uL (ref 0.7–4.0)
MCH: 28.6 pg (ref 26.0–34.0)
MCHC: 33.1 g/dL (ref 30.0–36.0)
MCV: 86.5 fL (ref 80.0–100.0)
Monocytes Absolute: 0.8 10*3/uL (ref 0.1–1.0)
Monocytes Relative: 17 %
Neutro Abs: 2.5 10*3/uL (ref 1.7–7.7)
Neutrophils Relative %: 53 %
Platelets: 226 10*3/uL (ref 150–400)
RBC: 4.82 MIL/uL (ref 4.22–5.81)
RDW: 15 % (ref 11.5–15.5)
WBC: 4.7 10*3/uL (ref 4.0–10.5)
nRBC: 0 % (ref 0.0–0.2)

## 2019-05-30 LAB — COMPREHENSIVE METABOLIC PANEL
ALT: 37 U/L (ref 0–44)
AST: 30 U/L (ref 15–41)
Albumin: 3.9 g/dL (ref 3.5–5.0)
Alkaline Phosphatase: 56 U/L (ref 38–126)
Anion gap: 10 (ref 5–15)
BUN: 14 mg/dL (ref 8–23)
CO2: 24 mmol/L (ref 22–32)
Calcium: 8.9 mg/dL (ref 8.9–10.3)
Chloride: 102 mmol/L (ref 98–111)
Creatinine, Ser: 1.59 mg/dL — ABNORMAL HIGH (ref 0.61–1.24)
GFR calc Af Amer: 52 mL/min — ABNORMAL LOW (ref >60)
GFR calc non Af Amer: 45 mL/min — ABNORMAL LOW (ref >60)
Glucose, Bld: 204 mg/dL — ABNORMAL HIGH (ref 70–99)
Potassium: 4.3 mmol/L (ref 3.5–5.1)
Sodium: 136 mmol/L (ref 135–145)
Total Bilirubin: 0.9 mg/dL (ref 0.3–1.2)
Total Protein: 7.1 g/dL (ref 6.5–8.1)

## 2019-05-30 NOTE — Progress Notes (Signed)
Patient c/o dizzy spells - which have improved a little since stopping chemo medication.  Patient also c/o feeling fluid in ears - which feels better today.

## 2019-05-30 NOTE — Telephone Encounter (Signed)
Called [employer] to check status of renewal application.  Representative stated that they need a statement from the pharmacy stating the patients out of pocket amount for the year and a copy of his insurance card.    I called the patient to let him know.  He said he would speak to his wife.  He called me back and I spoke with his wife.  Seems there was some confusion.  They thought I was calling about Melvin Willis and I told her that it was for his Xarelto.  I let her know the information that was needed.  She said she would contact [facility] to get a copy of his out of pocket spent for the year but doesn't think he has met 3$ of their income in prescriptions.    She did mention staying with [facility], but I informed that they are a 3rd party that gets paid for submitting patient assistance forms only.  They do not dispense medication or get people approved for assistance.  They did cancel their subscription with them.  I will follow up with Melvin Willis about getting the information.

## 2019-05-30 NOTE — Progress Notes (Signed)
[facility] OFFICE PROGRESS NOTE  Patient Care Team: [name], [name], MD as Medical Oncologist (Medical Oncology) Cathi Roan, [facility] (Pharmacist)  Cancer Staging No matching staging information was found for the patient.   Oncology History Overview Note  # OCT 2017- ADENO CA LUNG; STAGE IV [; LUL; bil supraclavicular LN; Left neck LN Bx]; ROS-1 MUTATED; s/p Carbo-alimta x1[oct 2017]  # NOV 1st 2017- XALKORI 250 mg BID; JAN 15th CT- PR;  # AUG 27th Chemo-RT to persistent LUL/mediastinal LN [s/p carbo-taxol- with RT; finished Sep 22nd 2018];   # July 14th 2020- multiple metastatic lesions of the brain [July 30 whole brain radiation finished April 21, 2019]; bone scan skull metastases /posterior left ninth rib metastases; April 04, 2019 stopped crizotinib;  #April 25, 2019-start Entrectinib 600 mg once a day  # LLE DVT/bil PE/Multiple strokes [? 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  Patient was recently on Entrectinib started about 3 weeks ago.  Patient noted to have worsening dizziness over the last 2 weeks or so.  He also noted to have stuffiness of his ears difficulty hearing.   As per  recommendations patient stopped taking Entrectinib approximately 1 week ago.  He also stopped taking Sudafed-improved hearing.  Dizziness is improved not resolved.  Patient more recently taking Proscar and Flomax regularly.  Noted to have improvement of his urination.  Review of Systems  Constitutional: Positive for malaise/fatigue. Negative for chills, diaphoresis, fever and weight loss.  HENT: Negative for nosebleeds and sore throat.   Eyes: Negative for double vision.  Respiratory: Negative for cough, hemoptysis, sputum production, shortness of breath and wheezing.   Cardiovascular: Positive for leg swelling. Negative for chest pain (Left upper chest wall pain chronic), palpitations and orthopnea.  Gastrointestinal: Negative for abdominal pain, blood in stool, constipation, diarrhea, heartburn and melena.  Genitourinary: Positive for frequency and urgency. Negative for dysuria.  Musculoskeletal: Negative for back pain and joint pain.  Skin: Negative.  Negative for itching and rash.  Neurological: Positive for dizziness. Negative for tingling, focal weakness and weakness.  Endo/Heme/Allergies: Does not bruise/bleed easily.  Psychiatric/Behavioral: Negative for depression. The patient is not nervous/anxious and does not have insomnia.       PAST MEDICAL HISTORY :  Past Medical History:  Diagnosis Date  . Abnormal prostate specific antigen 08/08/2012  . Adiposity 04/16/2015  . Chronic kidney disease (CKD), stage III (moderate) ([place]) 11/27/2016  . CVA (cerebral vascular accident) ([place]) 06/17/2016  . Diabetes mellitus without complication ([place])   . Diverticulosis of sigmoid colon 04/16/2015  . Dyslipidemia 03/18/2015  . Hemorrhoids, internal 04/16/2015  . Hypercholesteremia 04/16/2015  . Hyperlipidemia   . Hypertension   . Primary cancer of left upper lobe of lung ([place])   . Pulmonary embolism ([place])   . Wears dentures    partial  upper    PAST SURGICAL HISTORY :   Past Surgical History:   Procedure Laterality Date  . COLONOSCOPY    . COLONOSCOPY WITH PROPOFOL N/A 05/06/2015   Procedure: COLONOSCOPY WITH PROPOFOL;  Surgeon: Lucilla Lame, MD;  Location: [facility];  Service: Endoscopy;  Laterality: N/A;  ASCENDING COLON POLYPS X 2 TERMINAL ILEUM BIOPSY RANDOM COLON BX. TRANSVERSE COLON POLYP SIGMOID COLON POLYP  . ESOPHAGOGASTRODUODENOSCOPY (EGD) WITH PROPOFOL N/A 05/06/2015   Procedure: ESOPHAGOGASTRODUODENOSCOPY (EGD) WITH PROPOFOL;  Surgeon: Lucilla Lame, MD;  Location: [facility];  Service: Endoscopy;  Laterality: N/A;  GASTRIC BIOPSY X1  . KIDNEY STONE SURGERY  2017    FAMILY HISTORY :   Family History  Problem Relation Age of Onset  . Diabetes Mother   . Diabetes Father   . CAD Father   . Dementia Father   . Diabetes Sister   . Cancer Maternal Uncle        Prostate  . Cancer Cousin        prostate    SOCIAL HISTORY:   Social History   Tobacco Use  . Smoking status: Never Smoker  . Smokeless tobacco: Never Used  Substance Use Topics  . Alcohol use: No    Alcohol/week: 0.0 standard drinks  . Drug use: No    ALLERGIES:  has No Known Allergies.  MEDICATIONS:  Current Outpatient Medications  Medication Sig Dispense Refill  . atorvastatin (LIPITOR) 40 MG tablet Take 1 tablet (40 mg total) by mouth 3 (three) times a week. 48 tablet 1  . blood glucose meter kit and supplies Dispense based on patient and insurance preference (Accuchek Aviva Plus, Accuchek Nano). Use up to four times daily as directed. (FOR ICD-10 E10.9, E11.9). 1 each 0  . finasteride (PROSCAR) 5 MG tablet Take 1 tablet (5 mg total) by mouth daily. 90 tablet 3  . insulin degludec (TRESIBA FLEXTOUCH) 100 UNIT/ML SOPN FlexTouch Pen Inject 0.26 mLs (26 Units total) into the skin daily. 15 mL 0  . lisinopril (ZESTRIL) 5 MG tablet Take 1 tablet (5 mg total) by mouth daily. (Patient taking differently: Take 2.5 mg by mouth daily. ) 90 tablet 1  . metFORMIN (GLUCOPHAGE) 500 MG  tablet Take 1-2 tablets (500-1,000 mg total) by mouth 2 (two) times daily with a meal. (Patient taking differently: Take 500-1,000 mg by mouth 2 (two) times daily with a meal. 1 TID) 270 tablet 0  . ondansetron (ZOFRAN) 8 MG tablet One pill every 8 hours as needed for nausea/vomitting. 40 tablet 1  . tamsulosin (FLOMAX) 0.4 MG CAPS capsule Take 1 capsule (0.4 mg total) by mouth every morning. 90 capsule 3  . XARELTO 20 MG TABS tablet TAKE 1 TABLET BY MOUTH ONCE DAILY WITH  SUPPER 30 tablet 0  . dexamethasone (DECADRON) 4 MG tablet Take 1 tablet (4 mg total) by mouth daily. (Patient not taking: Reported on 05/08/2019) 20 tablet 0  . entrectinib (ROZLYTREK) 200 MG capsule Take 3 capsules (600 mg total) by mouth daily. (Patient not taking: Reported on 05/30/2019) 90 capsule 6  . nystatin (MYCOSTATIN) 100000 UNIT/ML suspension Take 5 mLs (500,000 Units total) by mouth 4 (four) times daily. (Patient not taking: Reported on 05/08/2019) 60 mL 0   No current facility-administered medications for this visit.     PHYSICAL EXAMINATION: ECOG PERFORMANCE STATUS: 0 - Asymptomatic  BP (!) 143/88   Pulse 99   Temp (!) 97.1 F (36.2 C)   Resp 18   Wt 166  lb (75.3 kg)   BMI 28.49 kg/m   Filed Weights   05/30/19 0953  Weight: 166 lb (75.3 kg)    Physical Exam  Constitutional: He is oriented to person, place, and time and well-developed, well-nourished, and in no distress.  Alone. He is walking by himself.  HENT:  Head: Normocephalic and atraumatic.  Mouth/Throat: Oropharynx is clear and moist. No oropharyngeal exudate.  Eyes: Pupils are equal, round, and reactive to light.  Neck: Normal range of motion. Neck supple.  Cardiovascular: Normal rate and regular rhythm.  Pulmonary/Chest: No respiratory distress. He has no wheezes.  Abdominal: Soft. Bowel sounds are normal. He exhibits no distension and no mass. There is no abdominal tenderness. There is no rebound and no guarding.  Musculoskeletal: Normal  range of motion.        General: No tenderness or edema.  Neurological: He is alert and oriented to person, place, and time.  Skin: Skin is warm.  Psychiatric: Affect normal.       LABORATORY DATA:  I have reviewed the data as listed    Component Value Date/Time   NA 136 05/30/2019 0933   NA 139 01/01/2016 1000   K 4.3 05/30/2019 0933   CL 102 05/30/2019 0933   CO2 24 05/30/2019 0933   GLUCOSE 204 (H) 05/30/2019 0933   BUN 14 05/30/2019 0933   BUN 13 01/01/2016 1000   CREATININE 1.59 (H) 05/30/2019 0933   CREATININE 1.32 (H) 08/22/2018 1107   CALCIUM 8.9 05/30/2019 0933   PROT 7.1 05/30/2019 0933   PROT 7.2 01/01/2016 1000   ALBUMIN 3.9 05/30/2019 0933   ALBUMIN 4.4 01/01/2016 1000   AST 30 05/30/2019 0933   ALT 37 05/30/2019 0933   ALKPHOS 56 05/30/2019 0933   BILITOT 0.9 05/30/2019 0933   BILITOT 0.4 01/01/2016 1000   GFRNONAA 45 (L) 05/30/2019 0933   GFRNONAA 56 (L) 08/22/2018 1107   GFRAA 52 (L) 05/30/2019 0933   GFRAA 65 08/22/2018 1107    No results found for: SPEP, UPEP  Lab Results  Component Value Date   WBC 4.7 05/30/2019   NEUTROABS 2.5 05/30/2019   HGB 13.8 05/30/2019   HCT 41.7 05/30/2019   MCV 86.5 05/30/2019   PLT 226 05/30/2019      Chemistry      Component Value Date/Time   NA 136 05/30/2019 0933   NA 139 01/01/2016 1000   K 4.3 05/30/2019 0933   CL 102 05/30/2019 0933   CO2 24 05/30/2019 0933   BUN 14 05/30/2019 0933   BUN 13 01/01/2016 1000   CREATININE 1.59 (H) 05/30/2019 0933   CREATININE 1.32 (H) 08/22/2018 1107      Component Value Date/Time   CALCIUM 8.9 05/30/2019 0933   ALKPHOS 56 05/30/2019 0933   AST 30 05/30/2019 0933   ALT 37 05/30/2019 0933   BILITOT 0.9 05/30/2019 0933   BILITOT 0.4 01/01/2016 1000       RADIOGRAPHIC STUDIES: I have personally reviewed the radiological images as listed and agreed with the findings in the report. No results found.   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  S/p WBRT [finished 8/14]; currently on Entrectinib; however on HOLD sec to poor tolerance.  #Tolerating with moderate difficulties- worsening dizziness [see below].  Discussed  that we will repeat imaging in the next 1 to 2 months.  Will order at next visit.  # Dizziness- ? URI- s/p sudafed/Entrectinib- on HOLD/ ortho stasis [Proscar and Flomax]- recommend HOLDING lisinopril.   # CKD-stage III multifactorial [prostatitic obstruction/diabetes]- worse; creatinine 1.76.  Continue finasteride/Flomax for now; if orthostasis not improved then recommend discontinuation of finasteride.  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 Counseled re: falls.   I spoke at length with the patient's family- regarding the patient's clinical status/plan of care.  Family agreement.   # DISPOSITION: # follow up in 2 weeks-MD; no labs- Dr.B    No orders of the defined types were placed in this encounter.  All questions were answered. The patient knows to call the clinic with any problems, questions or concerns.      Cammie Sickle, MD 05/30/2019 10:43 AM

## 2019-05-30 NOTE — Patient Instructions (Signed)
#  Stop taking lisinopril/blood pressure medication #Stop cancer pill- Rozyltrek

## 2019-05-30 NOTE — Assessment & Plan Note (Addendum)
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  S/p WBRT [finished 8/14]; currently on Entrectinib; however on HOLD sec to poor tolerance.  #Tolerating with moderate difficulties- worsening dizziness [see below].  Discussed that we will repeat imaging in the next 1 to 2 months.  Will order at next visit.  # Dizziness- ? URI- s/p sudafed/Entrectinib- on HOLD/ ortho stasis [Proscar and Flomax]- recommend HOLDING lisinopril.   # CKD-stage III multifactorial [prostatitic obstruction/diabetes]- worse; creatinine 1.76.  Continue finasteride/Flomax for now; if orthostasis not improved then recommend discontinuation of finasteride.  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 Counseled re: falls.   I spoke at length with the patient's family- regarding the patient's clinical status/plan of care.  Family agreement.   # DISPOSITION: # follow up in 2 weeks-MD; no labs- Dr.B

## 2019-06-05 NOTE — Telephone Encounter (Signed)
Oral Oncology Patient Advocate Encounter  Received notification from [place] [facility] that patient has been denied enrollment into their program to receive Xarelto from the drug manufacturer.    Patient was not eligible due to not meeting the 3% out of pocket spending required to qualify for assistance.   Patient knows to call the office with questions or concerns.   Oral Oncology Clinic will continue to follow.  [place] [facility] [place] Phone [phone] Fax [phone] 06/05/2019 10:55 AM

## 2019-06-06 ENCOUNTER — Telehealth: Payer: Self-pay

## 2019-06-06 ENCOUNTER — Encounter: Payer: Self-pay | Admitting: Pharmacist

## 2019-06-07 ENCOUNTER — Ambulatory Visit: Payer: Medicare Other | Admitting: Pharmacist

## 2019-06-07 DIAGNOSIS — E1122 Type 2 diabetes mellitus with diabetic chronic kidney disease: Secondary | ICD-10-CM

## 2019-06-07 DIAGNOSIS — N183 Chronic kidney disease, stage 3 unspecified: Secondary | ICD-10-CM

## 2019-06-07 NOTE — Chronic Care Management (AMB) (Signed)
Chronic Care Management   Follow Up Note   06/07/2019 Name: ENMANUEL ZUFALL MRN: [id number] DOB: Jan 17, 1951  Subjective Mehtab L Ripberger is a 68 y.o. year old male who is a primary care patient of [name], MD. The CCM clinical pharmacist following up today for patient's progression of CCM goals. HIPAA identifiers verified with patient's wife Melisa (on DPR).     Assessment Review of patient status, including review of consultants reports, relevant laboratory and other test results, and collaboration with appropriate care team members and the patient's provider was performed as part of comprehensive patient evaluation and provision of chronic care management services.    Outpatient Encounter Medications as of 06/07/2019  Medication Sig  . atorvastatin (LIPITOR) 40 MG tablet Take 1 tablet (40 mg total) by mouth 3 (three) times a week.  . blood glucose meter kit and supplies Dispense based on patient and insurance preference (Accuchek Aviva Plus, Accuchek Nano). Use up to four times daily as directed. (FOR ICD-10 E10.9, E11.9).  Marland Kitchen dexamethasone (DECADRON) 4 MG tablet Take 1 tablet (4 mg total) by mouth daily. (Patient not taking: Reported on 05/08/2019)  . entrectinib (ROZLYTREK) 200 MG capsule Take 3 capsules (600 mg total) by mouth daily. (Patient not taking: Reported on 05/30/2019)  . finasteride (PROSCAR) 5 MG tablet Take 1 tablet (5 mg total) by mouth daily.  . insulin degludec (TRESIBA FLEXTOUCH) 100 UNIT/ML SOPN FlexTouch Pen Inject 0.26 mLs (26 Units total) into the skin daily.  Marland Kitchen lisinopril (ZESTRIL) 5 MG tablet Take 1 tablet (5 mg total) by mouth daily. (Patient taking differently: Take 2.5 mg by mouth daily. )  . metFORMIN (GLUCOPHAGE) 500 MG tablet Take 1-2 tablets (500-1,000 mg total) by mouth 2 (two) times daily with a meal. (Patient taking differently: Take 500-1,000 mg by mouth 2 (two) times daily with a meal. 1 TID)  . nystatin (MYCOSTATIN) 100000 UNIT/ML suspension  Take 5 mLs (500,000 Units total) by mouth 4 (four) times daily. (Patient not taking: Reported on 05/08/2019)  . ondansetron (ZOFRAN) 8 MG tablet One pill every 8 hours as needed for nausea/vomitting.  . tamsulosin (FLOMAX) 0.4 MG CAPS capsule Take 1 capsule (0.4 mg total) by mouth every morning.  Alveda Reasons 20 MG TABS tablet TAKE 1 TABLET BY MOUTH ONCE DAILY WITH  SUPPER   No facility-administered encounter medications on file as of 06/07/2019.      Goals Addressed            This Visit's Progress   . Diabetes (pt-stated)       Current Barriers:  . Dexamethasone for chemotherapy is significantly increasing blood sugars . Financial barriers  Clinical Pharmacist Goal(s):  Over the next 90 days, patient will demonstrate improved adherence to prescribed treatment plan for diabetes self care/management as evidenced by:  . daily monitoring and recording of CBG  . Lowered A1c   Over the next 14 days, Mr.. Tanori will provide the necessary supplementary documents (proof of out of pocket prescription expenditure, proof of household income) needed for medication assistance applications to CCM pharmacist.    Interventions:  . Reviewed medications with patient and discussed importance of medication adherence . Dr. Ancil Boozer to prescribe OneTouch glucometer, strips, and lancets to [facility] on [address]. . Provided patient with Novonordisk application for Tresiba insulin o Updated 9/15: reviewed proper documents needed, reviewed availability of sample o Updated 6/83: application completed, Mrs. Bolio dropping off tomorrow at [facility]  Patient Self Care Activities:  . Self administers oral medications  as prescribed . Self administers insulin as prescribed . Checks blood sugars as prescribed and utilize hyper and hypoglycemia protocol as needed  Please see past updates related to this goal by clicking on the "Past Updates" button in the selected goal          Telephone follow up appointment  with care management team member scheduled for:  30 days for global DM assessment and sooner pending application decision from [place], PharmD Clinical Pharmacist [place] [phone]

## 2019-06-07 NOTE — Progress Notes (Signed)
This encounter was created in error - please disregard.

## 2019-06-12 NOTE — Telephone Encounter (Signed)
Spoke with patient and wife Melvin Willis today.  They are enrolled in the [facility] to receive Xarelto for $85 a month until 09/07/2019.  The $85 does not go towards the patients deductible- this is a discount program only.  To re-enroll for next year they have to have paid $200 out of pocket on prescriptions.    Currently his Xarelto copay includes a $435 deductible + $47 copay.    [facility] is the pharmacy that will ship the Xarelto to the patient.  Their phone number is [phone] and the fax number is [phone].

## 2019-06-13 ENCOUNTER — Other Ambulatory Visit: Payer: Self-pay | Admitting: *Deleted

## 2019-06-13 ENCOUNTER — Other Ambulatory Visit: Payer: Self-pay

## 2019-06-13 ENCOUNTER — Encounter: Payer: Self-pay | Admitting: Internal Medicine

## 2019-06-13 ENCOUNTER — Ambulatory Visit: Payer: Self-pay | Admitting: Pharmacist

## 2019-06-13 ENCOUNTER — Inpatient Hospital Stay: Payer: Medicare Other | Attending: Internal Medicine | Admitting: Internal Medicine

## 2019-06-13 DIAGNOSIS — R110 Nausea: Secondary | ICD-10-CM | POA: Insufficient documentation

## 2019-06-13 DIAGNOSIS — Z8719 Personal history of other diseases of the digestive system: Secondary | ICD-10-CM | POA: Diagnosis not present

## 2019-06-13 DIAGNOSIS — E1122 Type 2 diabetes mellitus with diabetic chronic kidney disease: Secondary | ICD-10-CM | POA: Insufficient documentation

## 2019-06-13 DIAGNOSIS — R5383 Other fatigue: Secondary | ICD-10-CM | POA: Diagnosis not present

## 2019-06-13 DIAGNOSIS — Z8249 Family history of ischemic heart disease and other diseases of the circulatory system: Secondary | ICD-10-CM | POA: Insufficient documentation

## 2019-06-13 DIAGNOSIS — M7989 Other specified soft tissue disorders: Secondary | ICD-10-CM | POA: Diagnosis not present

## 2019-06-13 DIAGNOSIS — C7931 Secondary malignant neoplasm of brain: Secondary | ICD-10-CM | POA: Insufficient documentation

## 2019-06-13 DIAGNOSIS — I2699 Other pulmonary embolism without acute cor pulmonale: Secondary | ICD-10-CM

## 2019-06-13 DIAGNOSIS — Z86711 Personal history of pulmonary embolism: Secondary | ICD-10-CM | POA: Diagnosis not present

## 2019-06-13 DIAGNOSIS — R350 Frequency of micturition: Secondary | ICD-10-CM | POA: Diagnosis not present

## 2019-06-13 DIAGNOSIS — Z79899 Other long term (current) drug therapy: Secondary | ICD-10-CM | POA: Diagnosis not present

## 2019-06-13 DIAGNOSIS — Z8042 Family history of malignant neoplasm of prostate: Secondary | ICD-10-CM | POA: Diagnosis not present

## 2019-06-13 DIAGNOSIS — N183 Chronic kidney disease, stage 3 unspecified: Secondary | ICD-10-CM

## 2019-06-13 DIAGNOSIS — Z833 Family history of diabetes mellitus: Secondary | ICD-10-CM | POA: Insufficient documentation

## 2019-06-13 DIAGNOSIS — Z86718 Personal history of other venous thrombosis and embolism: Secondary | ICD-10-CM | POA: Insufficient documentation

## 2019-06-13 DIAGNOSIS — Z8673 Personal history of transient ischemic attack (TIA), and cerebral infarction without residual deficits: Secondary | ICD-10-CM | POA: Insufficient documentation

## 2019-06-13 DIAGNOSIS — C3412 Malignant neoplasm of upper lobe, left bronchus or lung: Secondary | ICD-10-CM | POA: Diagnosis not present

## 2019-06-13 DIAGNOSIS — Z7901 Long term (current) use of anticoagulants: Secondary | ICD-10-CM | POA: Diagnosis not present

## 2019-06-13 DIAGNOSIS — C7951 Secondary malignant neoplasm of bone: Secondary | ICD-10-CM | POA: Insufficient documentation

## 2019-06-13 DIAGNOSIS — R3915 Urgency of urination: Secondary | ICD-10-CM | POA: Insufficient documentation

## 2019-06-13 DIAGNOSIS — Z86010 Personal history of colonic polyps: Secondary | ICD-10-CM | POA: Diagnosis not present

## 2019-06-13 DIAGNOSIS — R42 Dizziness and giddiness: Secondary | ICD-10-CM | POA: Insufficient documentation

## 2019-06-13 DIAGNOSIS — Z9079 Acquired absence of other genital organ(s): Secondary | ICD-10-CM | POA: Insufficient documentation

## 2019-06-13 DIAGNOSIS — Z794 Long term (current) use of insulin: Secondary | ICD-10-CM | POA: Insufficient documentation

## 2019-06-13 MED ORDER — RIVAROXABAN 20 MG PO TABS: ORAL_TABLET | ORAL | 6 refills | Status: DC

## 2019-06-13 NOTE — Assessment & Plan Note (Addendum)
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  S/p WBRT [finished 8/14]; currently on Entrectinib  on HOLD sec to poor tolerance.  #As the dizziness is improved proceed with Rozyltrek 2 pills/day. Will repeat MRI scan in mid- Nov 2020.    # Dizziness-multifactorial orthostatic drug-induced; recommend discontinuation of Proscar/continue to hold lisinopril.  Monitor closely.  # CKD-stage III multifactorial [prostatitic obstruction/diabetes]-improved; recommend discontinuation of finasteride.   # Bilateral PE & left lower extremity DVT:  on Xarelto; stable.   I spoke at length with the patient's family/wife- regarding the patient's clinical status/plan of care.  Family agreement.   # DISPOSITION: # follow up in 3 weeks-MD;labs- cbc/cmp- Dr.B

## 2019-06-13 NOTE — Progress Notes (Signed)
Patient is here for follow up, he is doing well, he mentions some vomiting but this has gotten better. His appetite is fair mentions not eating much. Does not drink boost or ensure but thinks he may need to. He also mentions some dizziness but is better this week.

## 2019-06-13 NOTE — Patient Instructions (Addendum)
#  Stop finasteride/Proscar because of dizziness  # continue to HOLD lisnopril  #Start taking lung cancer pill- Rozyltrek [take 2 pills once a day]

## 2019-06-13 NOTE — Progress Notes (Signed)
[place] OFFICE PROGRESS NOTE  Patient Care Team: [name], [name], MD as Medical Oncologist (Medical Oncology) Cathi Roan, [facility] (Pharmacist)  Cancer Staging No matching staging information was found for the patient.   Oncology History Overview Note  # OCT 2017- ADENO CA LUNG; STAGE IV [; LUL; bil supraclavicular LN; Left neck LN Bx]; ROS-1 MUTATED; s/p Carbo-alimta x1[oct 2017]  # NOV 1st 2017- XALKORI 250 mg BID; JAN 15th CT- PR;  # AUG 27th Chemo-RT to persistent LUL/mediastinal LN [s/p carbo-taxol- with RT; finished Sep 22nd 2018];   # July 14th 2020- multiple metastatic lesions of the brain [July 30 whole brain radiation finished April 21, 2019]; bone scan skull metastases /posterior left ninth rib metastases; April 04, 2019 stopped crizotinib;  #April 25, 2019-start Entrectinib 600 mg once a day  # LLE DVT/bil PE/Multiple strokes [? 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  Patient was recently on Entrectinib started about 3 weeks ago.  Patient is Entrectinib was held 3 weeks ago because of severe dizziness.  Patient was found to orthostatic.  Patient also discontinued his  lisinopril.  Patient symptoms of dizziness improved.  He is less tired.  Had one episode of nausea vomiting currently none.  Review of Systems  Constitutional: Positive for malaise/fatigue. Negative for chills, diaphoresis, fever and weight loss.  HENT: Negative for nosebleeds and sore throat.   Eyes: Negative for double vision.  Respiratory: Negative for cough, hemoptysis, sputum production, shortness of breath and wheezing.   Cardiovascular: Positive for leg swelling. Negative for chest pain (Left upper chest wall pain chronic), palpitations and orthopnea.  Gastrointestinal: Negative for abdominal pain, blood in stool, constipation, diarrhea, heartburn and melena.  Genitourinary: Positive for frequency and urgency. Negative for dysuria.  Musculoskeletal: Negative for back pain and joint pain.  Skin: Negative.  Negative for itching and rash.  Neurological: Positive for dizziness. Negative for tingling, focal weakness and weakness.  Endo/Heme/Allergies: Does not bruise/bleed easily.  Psychiatric/Behavioral: Negative for depression. The patient is not nervous/anxious and does not have insomnia.       PAST MEDICAL HISTORY :  Past Medical History:  Diagnosis Date  . Abnormal prostate specific antigen 08/08/2012  . Adiposity 04/16/2015  . Chronic kidney disease (CKD), stage III (moderate) 11/27/2016  . CVA (cerebral vascular accident) ([place]) 06/17/2016  . Diabetes mellitus without complication ([place])   . Diverticulosis of sigmoid colon 04/16/2015  . Dyslipidemia 03/18/2015  . Hemorrhoids, internal 04/16/2015  . Hypercholesteremia 04/16/2015  . Hyperlipidemia   . Hypertension   . Primary cancer of left upper lobe of lung ([place])   . Pulmonary embolism ([place])   . Wears dentures    partial upper    PAST SURGICAL HISTORY :   Past Surgical History:  Procedure Laterality Date  . COLONOSCOPY    . COLONOSCOPY  WITH PROPOFOL N/A 05/06/2015   Procedure: COLONOSCOPY WITH PROPOFOL;  Surgeon: Lucilla Lame,  MD;  Location: [place];  Service: Endoscopy;  Laterality: N/A;  ASCENDING COLON POLYPS X 2 TERMINAL ILEUM BIOPSY RANDOM COLON BX. TRANSVERSE COLON POLYP SIGMOID COLON POLYP  . ESOPHAGOGASTRODUODENOSCOPY (EGD) WITH PROPOFOL N/A 05/06/2015   Procedure: ESOPHAGOGASTRODUODENOSCOPY (EGD) WITH PROPOFOL;  Surgeon: Lucilla Lame, MD;  Location: [place];  Service: Endoscopy;  Laterality: N/A;  GASTRIC BIOPSY X1  . KIDNEY STONE SURGERY  2017    FAMILY HISTORY :   Family History  Problem Relation Age of Onset  . Diabetes Mother   . Diabetes Father   . CAD Father   . Dementia Father   . Diabetes Sister   . Cancer Maternal Uncle        Prostate  . Cancer Cousin        prostate    SOCIAL HISTORY:   Social History   Tobacco Use  . Smoking status: Never Smoker  . Smokeless tobacco: Never Used  Substance Use Topics  . Alcohol use: No    Alcohol/week: 0.0 standard drinks  . Drug use: No    ALLERGIES:  has No Known Allergies.  MEDICATIONS:  Current Outpatient Medications  Medication Sig Dispense Refill  . atorvastatin (LIPITOR) 40 MG tablet Take 1 tablet (40 mg total) by mouth 3 (three) times a week. 48 tablet 1  . blood glucose meter kit and supplies Dispense based on patient and insurance preference (Accuchek Aviva Plus, Accuchek Nano). Use up to four times daily as directed. (FOR ICD-10 E10.9, E11.9). 1 each 0  . finasteride (PROSCAR) 5 MG tablet Take 1 tablet (5 mg total) by mouth daily. 90 tablet 3  . insulin degludec (TRESIBA FLEXTOUCH) 100 UNIT/ML SOPN FlexTouch Pen Inject 0.26 mLs (26 Units total) into the skin daily. 15 mL 0  . lisinopril (ZESTRIL) 5 MG tablet Take 1 tablet (5 mg total) by mouth daily. (Patient taking differently: Take 2.5 mg by mouth daily. ) 90 tablet 1  . metFORMIN (GLUCOPHAGE) 500 MG tablet Take 1-2 tablets (500-1,000 mg total) by mouth 2 (two) times daily with a meal. (Patient taking differently: Take 500-1,000 mg by mouth 2 (two) times  daily with a meal. 1 TID) 270 tablet 0  . nystatin (MYCOSTATIN) 100000 UNIT/ML suspension Take 5 mLs (500,000 Units total) by mouth 4 (four) times daily. 60 mL 0  . ondansetron (ZOFRAN) 8 MG tablet One pill every 8 hours as needed for nausea/vomitting. 40 tablet 1  . rivaroxaban (XARELTO) 20 MG TABS tablet TAKE 1 TABLET BY MOUTH ONCE DAILY WITH  SUPPER 30 tablet 6  . tamsulosin (FLOMAX) 0.4 MG CAPS capsule Take 1 capsule (0.4 mg total) by mouth every morning. 90 capsule 3  . dexamethasone (DECADRON) 4 MG tablet Take 1 tablet (4 mg total) by mouth daily. (Patient not taking: Reported on 05/08/2019) 20 tablet 0  . entrectinib (ROZLYTREK) 200 MG capsule Take 3 capsules (600 mg total) by mouth daily. (Patient not taking: Reported on 05/30/2019) 90 capsule 6   No current facility-administered medications for this visit.     PHYSICAL EXAMINATION: ECOG PERFORMANCE STATUS: 0 - Asymptomatic  BP (!) 154/94 (BP Location: Left Arm, Patient Position: Sitting, Cuff Size: Normal)   Pulse 93   Temp (!) 97.3 F (36.3 C) (Tympanic)   Resp 16   Wt 158 lb (71.7 kg)   SpO2 100%   BMI 27.12 kg/m   [employer]   06/13/19  2353  Weight: 158 lb (71.7 kg)    Physical Exam  Constitutional: He is oriented to person, place, and time and well-developed, well-nourished, and in no distress.  Alone. He is walking by himself.  HENT:  Head: Normocephalic and atraumatic.  Mouth/Throat: Oropharynx is clear and moist. No oropharyngeal exudate.  Eyes: Pupils are equal, round, and reactive to light.  Neck: Normal range of motion. Neck supple.  Cardiovascular: Normal rate and regular rhythm.  Pulmonary/Chest: No respiratory distress. He has no wheezes.  Abdominal: Soft. Bowel sounds are normal. He exhibits no distension and no mass. There is no abdominal tenderness. There is no rebound and no guarding.  Musculoskeletal: Normal range of motion.        General: No tenderness or edema.  Neurological: He is alert and  oriented to person, place, and time.  Skin: Skin is warm.  Psychiatric: Affect normal.       LABORATORY DATA:  I have reviewed the data as listed    Component Value Date/Time   NA 136 05/30/2019 0933   NA 139 01/01/2016 1000   K 4.3 05/30/2019 0933   CL 102 05/30/2019 0933   CO2 24 05/30/2019 0933   GLUCOSE 204 (H) 05/30/2019 0933   BUN 14 05/30/2019 0933   BUN 13 01/01/2016 1000   CREATININE 1.59 (H) 05/30/2019 0933   CREATININE 1.32 (H) 08/22/2018 1107   CALCIUM 8.9 05/30/2019 0933   PROT 7.1 05/30/2019 0933   PROT 7.2 01/01/2016 1000   ALBUMIN 3.9 05/30/2019 0933   ALBUMIN 4.4 01/01/2016 1000   AST 30 05/30/2019 0933   ALT 37 05/30/2019 0933   ALKPHOS 56 05/30/2019 0933   BILITOT 0.9 05/30/2019 0933   BILITOT 0.4 01/01/2016 1000   GFRNONAA 45 (L) 05/30/2019 0933   GFRNONAA 56 (L) 08/22/2018 1107   GFRAA 52 (L) 05/30/2019 0933   GFRAA 65 08/22/2018 1107    No results found for: SPEP, UPEP  Lab Results  Component Value Date   WBC 4.7 05/30/2019   NEUTROABS 2.5 05/30/2019   HGB 13.8 05/30/2019   HCT 41.7 05/30/2019   MCV 86.5 05/30/2019   PLT 226 05/30/2019      Chemistry      Component Value Date/Time   NA 136 05/30/2019 0933   NA 139 01/01/2016 1000   K 4.3 05/30/2019 0933   CL 102 05/30/2019 0933   CO2 24 05/30/2019 0933   BUN 14 05/30/2019 0933   BUN 13 01/01/2016 1000   CREATININE 1.59 (H) 05/30/2019 0933   CREATININE 1.32 (H) 08/22/2018 1107      Component Value Date/Time   CALCIUM 8.9 05/30/2019 0933   ALKPHOS 56 05/30/2019 0933   AST 30 05/30/2019 0933   ALT 37 05/30/2019 0933   BILITOT 0.9 05/30/2019 0933   BILITOT 0.4 01/01/2016 1000       RADIOGRAPHIC STUDIES: I have personally reviewed the radiological images as listed and agreed with the findings in the report. No results found.   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  S/p WBRT [finished 8/14]; currently on Entrectinib  on HOLD sec to poor tolerance.  #As the dizziness is improved proceed with Rozyltrek 2 pills/day. Will repeat MRI scan in mid- Nov 2020.    # Dizziness-multifactorial orthostatic drug-induced;  recommend discontinuation of Proscar/continue to hold lisinopril.  Monitor closely.  # CKD-stage III multifactorial [prostatitic obstruction/diabetes]-improved; recommend discontinuation of finasteride.   # Bilateral PE & left lower extremity DVT:  on Xarelto; stable.   I spoke at length with the patient's family/wife- regarding the patient's clinical status/plan of care.  Family agreement.   # DISPOSITION: # follow up in 3 weeks-MD;labs- cbc/cmp- Dr.B    Orders Placed This Encounter  Procedures  . CBC with Differential    Standing Status:   Future    Standing Expiration Date:   06/12/2020  . Comprehensive metabolic panel    Standing Status:   Future    Standing Expiration Date:   06/12/2020   All questions were answered. The patient knows to call the clinic with any problems, questions or concerns.      Cammie Sickle, MD 06/13/2019 12:21 PM

## 2019-06-15 ENCOUNTER — Telehealth: Payer: Self-pay

## 2019-06-15 NOTE — Telephone Encounter (Signed)
Copied from [place] [id number]. Topic: General - Inquiry >> Jun 14, 2019  2:18 PM Richardo Priest, [place] wrote: Reason for CRM: Patient called in and is wanting to come in office to pick up samples of insulin degludec (TRESIBA FLEXTOUCH) 100 UNIT/ML SOPN FlexTouch Pen. Please advise.

## 2019-06-16 NOTE — Telephone Encounter (Signed)
Yes he already sees [place]. Do you want me to forward this message to her?

## 2019-06-16 NOTE — Telephone Encounter (Signed)
Patient is engaged with me and we are in the process of applying for Tresiba assistance- still missing patient's signature portion of application.   Ruben Reason, PharmD Clinical Pharmacist [facility] [phone]

## 2019-06-16 NOTE — Chronic Care Management (AMB) (Signed)
Chronic Care Management   Follow Up Note   06/16/2019- late entry Name: Melvin Willis MRN: [id number] DOB: 1950-09-29  Subjective Melvin Willis is a 68 y.o. year old male who is a primary care patient of [name], MD. Incoming call from patient, HIPAA identifiers verified.   Assessment Review of patient status, including review of consultants reports, relevant laboratory and other test results, and collaboration with appropriate care team members and the patient's provider was performed as part of comprehensive patient evaluation and provision of chronic care management services.    Outpatient Encounter Medications as of 06/13/2019  Medication Sig  . atorvastatin (LIPITOR) 40 MG tablet Take 1 tablet (40 mg total) by mouth 3 (three) times a week.  . blood glucose meter kit and supplies Dispense based on patient and insurance preference (Accuchek Aviva Plus, Accuchek Nano). Use up to four times daily as directed. (FOR ICD-10 E10.9, E11.9).  Marland Kitchen dexamethasone (DECADRON) 4 MG tablet Take 1 tablet (4 mg total) by mouth daily. (Patient not taking: Reported on 05/08/2019)  . entrectinib (ROZLYTREK) 200 MG capsule Take 3 capsules (600 mg total) by mouth daily. (Patient not taking: Reported on 05/30/2019)  . finasteride (PROSCAR) 5 MG tablet Take 1 tablet (5 mg total) by mouth daily.  . insulin degludec (TRESIBA FLEXTOUCH) 100 UNIT/ML SOPN FlexTouch Pen Inject 0.26 mLs (26 Units total) into the skin daily.  Marland Kitchen lisinopril (ZESTRIL) 5 MG tablet Take 1 tablet (5 mg total) by mouth daily. (Patient taking differently: Take 2.5 mg by mouth daily. )  . metFORMIN (GLUCOPHAGE) 500 MG tablet Take 1-2 tablets (500-1,000 mg total) by mouth 2 (two) times daily with a meal. (Patient taking differently: Take 500-1,000 mg by mouth 2 (two) times daily with a meal. 1 TID)  . nystatin (MYCOSTATIN) 100000 UNIT/ML suspension Take 5 mLs (500,000 Units total) by mouth 4 (four) times daily.  . ondansetron  (ZOFRAN) 8 MG tablet One pill every 8 hours as needed for nausea/vomitting.  . rivaroxaban (XARELTO) 20 MG TABS tablet TAKE 1 TABLET BY MOUTH ONCE DAILY WITH  SUPPER  . tamsulosin (FLOMAX) 0.4 MG CAPS capsule Take 1 capsule (0.4 mg total) by mouth every morning.   No facility-administered encounter medications on file as of 06/13/2019.      Goals Addressed            This Visit's Progress   . Diabetes (pt-stated)       Current Barriers:  . Dexamethasone for chemotherapy is significantly increasing blood sugars . Financial barriers  Clinical Pharmacist Goal(s):  Over the next 90 days, patient will demonstrate improved adherence to prescribed treatment plan for diabetes self care/management as evidenced by:  . daily monitoring and recording of CBG  . Lowered A1c   Over the next 14 days, Melvin Willis will provide the necessary supplementary documents (proof of out of pocket prescription expenditure, proof of household income) needed for medication assistance applications to CCM pharmacist.    Interventions:  . Reviewed medications with patient and discussed importance of medication adherence . Dr. Ancil Boozer to prescribe OneTouch glucometer, strips, and lancets to [facility] on [address]. . Provided patient with Novonordisk application for Tresiba insulin o Updated 9/15: reviewed proper documents needed, reviewed availability of sample o Updated 1/74: application completed, Mrs. Grabel dropping off tomorrow at [facility] o Updated 10/6: patient called because he is almost out of [place]. Directed patient to call the practice, reviewed that he still has not turned in NovoNordisk application  Patient Self  Care Activities:  . Self administers oral medications as prescribed . Self administers insulin as prescribed . Checks blood sugars as prescribed and utilize hyper and hypoglycemia protocol as needed  Please see past updates related to this goal by clicking on the "Past Updates" button in  the selected goal          Telephone follow up appointment with care management team member scheduled for:  30 days for global DM assessment and sooner pending application decision from [place], PharmD Clinical Pharmacist [place] [phone]

## 2019-06-16 NOTE — Telephone Encounter (Signed)
Spoke with the patient and he is out of his [place]. Called and Spoke with Aaron Edelman (he is on vacation ) and he will try and stop by around lunch time to drop off samples for the patient

## 2019-06-19 ENCOUNTER — Ambulatory Visit (INDEPENDENT_AMBULATORY_CARE_PROVIDER_SITE_OTHER): Payer: Medicare Other | Admitting: Pharmacist

## 2019-06-19 DIAGNOSIS — E1122 Type 2 diabetes mellitus with diabetic chronic kidney disease: Secondary | ICD-10-CM | POA: Diagnosis not present

## 2019-06-19 DIAGNOSIS — N183 Chronic kidney disease, stage 3 unspecified: Secondary | ICD-10-CM | POA: Diagnosis not present

## 2019-06-19 NOTE — Chronic Care Management (AMB) (Signed)
  Chronic Care Management   Care Coordination Note  06/19/2019 Name: Melvin Willis MRN: [id number] DOB: 07/09/1951  Care Coordination: NovoNordisk applications   Goals Addressed            This Visit's Progress   . Diabetes (pt-stated)       Current Barriers:  . Dexamethasone for chemotherapy is significantly increasing blood sugars . Financial barriers  Clinical Pharmacist Goal(s):  Over the next 90 days, patient will demonstrate improved adherence to prescribed treatment plan for diabetes self care/management as evidenced by:  . daily monitoring and recording of CBG  . Lowered A1c   Over the next 14 days, Mr.. Willis will provide the necessary supplementary documents (proof of out of pocket prescription expenditure, proof of household income) needed for medication assistance applications to CCM pharmacist.    Interventions:  . Reviewed medications with patient and discussed importance of medication adherence . Dr. Ancil Boozer to prescribe OneTouch glucometer, strips, and lancets to [facility] on [address]. . Provided patient with Novonordisk application for Tresiba insulin o Updated 9/15: reviewed proper documents needed, reviewed availability of sample o Updated 6/54: application completed, Melvin Willis dropping off tomorrow at [facility] o Updated 10/6: patient called because he is almost out of [place]. Directed patient to call the practice, reviewed that he still has not turned in NovoNordisk application o Updated 65/03: patient returned missing NovoNordisk application elements; submitted application   Patient Self Care Activities:  . Self administers oral medications as prescribed . Self administers insulin as prescribed . Checks blood sugars as prescribed and utilize hyper and hypoglycemia protocol as needed  Please see past updates related to this goal by clicking on the "Past Updates" button in the selected goal            Follow up plan: Telephone follow up  appointment with care management team member scheduled for: 1 week follow up with PharmD  Melvin Willis, PharmD Clinical Pharmacist [place] [phone]

## 2019-07-03 ENCOUNTER — Encounter: Payer: Self-pay | Admitting: Oncology

## 2019-07-03 ENCOUNTER — Other Ambulatory Visit: Payer: Self-pay

## 2019-07-03 NOTE — Progress Notes (Signed)
Patient stated that he had been doing well with no complaints. 

## 2019-07-04 ENCOUNTER — Inpatient Hospital Stay (HOSPITAL_BASED_OUTPATIENT_CLINIC_OR_DEPARTMENT_OTHER): Payer: Medicare Other | Admitting: Oncology

## 2019-07-04 ENCOUNTER — Inpatient Hospital Stay: Payer: Medicare Other

## 2019-07-04 ENCOUNTER — Other Ambulatory Visit: Payer: Self-pay

## 2019-07-04 VITALS — BP 135/87 | HR 75 | Temp 97.8°F | Ht 64.0 in | Wt 159.0 lb

## 2019-07-04 DIAGNOSIS — C7931 Secondary malignant neoplasm of brain: Secondary | ICD-10-CM | POA: Diagnosis not present

## 2019-07-04 DIAGNOSIS — C3412 Malignant neoplasm of upper lobe, left bronchus or lung: Secondary | ICD-10-CM | POA: Diagnosis not present

## 2019-07-04 DIAGNOSIS — Z79899 Other long term (current) drug therapy: Secondary | ICD-10-CM

## 2019-07-04 DIAGNOSIS — R42 Dizziness and giddiness: Secondary | ICD-10-CM | POA: Diagnosis not present

## 2019-07-04 DIAGNOSIS — R5383 Other fatigue: Secondary | ICD-10-CM | POA: Diagnosis not present

## 2019-07-04 DIAGNOSIS — M7989 Other specified soft tissue disorders: Secondary | ICD-10-CM | POA: Diagnosis not present

## 2019-07-04 DIAGNOSIS — C7951 Secondary malignant neoplasm of bone: Secondary | ICD-10-CM | POA: Diagnosis not present

## 2019-07-04 LAB — COMPREHENSIVE METABOLIC PANEL
ALT: 25 U/L (ref 0–44)
AST: 22 U/L (ref 15–41)
Albumin: 4.1 g/dL (ref 3.5–5.0)
Alkaline Phosphatase: 47 U/L (ref 38–126)
Anion gap: 11 (ref 5–15)
BUN: 16 mg/dL (ref 8–23)
CO2: 22 mmol/L (ref 22–32)
Calcium: 9.6 mg/dL (ref 8.9–10.3)
Chloride: 108 mmol/L (ref 98–111)
Creatinine, Ser: 1.72 mg/dL — ABNORMAL HIGH (ref 0.61–1.24)
GFR calc Af Amer: 47 mL/min — ABNORMAL LOW (ref >60)
GFR calc non Af Amer: 41 mL/min — ABNORMAL LOW (ref >60)
Glucose, Bld: 229 mg/dL — ABNORMAL HIGH (ref 70–99)
Potassium: 4 mmol/L (ref 3.5–5.1)
Sodium: 141 mmol/L (ref 135–145)
Total Bilirubin: 0.8 mg/dL (ref 0.3–1.2)
Total Protein: 7 g/dL (ref 6.5–8.1)

## 2019-07-04 LAB — CBC WITH DIFFERENTIAL/PLATELET
Abs Immature Granulocytes: 0.01 10*3/uL (ref 0.00–0.07)
Basophils Absolute: 0 10*3/uL (ref 0.0–0.1)
Basophils Relative: 1 %
Eosinophils Absolute: 0.3 10*3/uL (ref 0.0–0.5)
Eosinophils Relative: 8 %
HCT: 40.1 % (ref 39.0–52.0)
Hemoglobin: 13.5 g/dL (ref 13.0–17.0)
Immature Granulocytes: 0 %
Lymphocytes Relative: 44 %
Lymphs Abs: 1.7 10*3/uL (ref 0.7–4.0)
MCH: 29.3 pg (ref 26.0–34.0)
MCHC: 33.7 g/dL (ref 30.0–36.0)
MCV: 87 fL (ref 80.0–100.0)
Monocytes Absolute: 0.5 10*3/uL (ref 0.1–1.0)
Monocytes Relative: 12 %
Neutro Abs: 1.4 10*3/uL — ABNORMAL LOW (ref 1.7–7.7)
Neutrophils Relative %: 35 %
Platelets: 237 10*3/uL (ref 150–400)
RBC: 4.61 MIL/uL (ref 4.22–5.81)
RDW: 15.9 % — ABNORMAL HIGH (ref 11.5–15.5)
WBC: 3.9 10*3/uL — ABNORMAL LOW (ref 4.0–10.5)
nRBC: 0 % (ref 0.0–0.2)

## 2019-07-09 NOTE — Progress Notes (Signed)
Hematology/Oncology Consult note [facility]  [phone] [phone]  Patient Care Team: [name], [name], MD as Medical Oncologist (Medical Oncology) Cathi Roan, [facility] (Pharmacist)   Name of the patient: Melvin Willis  [id number]  [birth year]   Date of visit: 07/09/19  Diagnosis- stage IV lung cancer  Chief complaint/ Reason for visit- routine f/uof lung cancer on entrectenib  Heme/Onc history:  Oncology History Overview Note  # OCT 2017- ADENO CA LUNG; STAGE IV [; LUL; bil supraclavicular LN; Left neck LN Bx]; ROS-1 MUTATED; s/p Carbo-alimta x1[oct 2017]  # NOV 1st 2017- XALKORI 250 mg BID; JAN 15th CT- PR;  # AUG 27th Chemo-RT to persistent LUL/mediastinal LN [s/p carbo-taxol- with RT; finished Sep 22nd 2018];   # July 14th 2020- multiple metastatic lesions of the brain [July 30 whole brain radiation finished April 21, 2019]; bone scan skull metastases /posterior left ninth rib metastases; April 04, 2019 stopped crizotinib;  #April 25, 2019-start Entrectinib 600 mg once a day  # LLE DVT/bil PE/Multiple strokes [? On xarelto]-Lovenox; Jan mid 2018- xarelto [lovenox-insurance issues]  # MRI brain- multiple infarcts [2d echo/bubble study-NEG's/p Neurology eval]  ------------------------------------------------------------    # # s/p TURP [Sep 2017; Dr.Cope] DEC 26th CT- distended bladder   # MOLECULAR TESTING- ROS-1 POSITIVE; ALK/EGFR-NEG; PDL-1 EXPRESSION- 90%** [HIGH] -----------------------------------------------------------------    DIAGNOSIS: Adenocarcinoma the lung  ROS-1 +  STAGE:  IV    ;GOALS: Palliative  CURRENT/MOST RECENT THERAPY-r whole brain radiation   Primary cancer of left upper lobe of lung ([place])     Interval history- patient reports tolerating 400 mg entrectenib better.does not report any dizziness. Nausea is mild and self  limited.   ECOG PS- 1 Pain scale- 0   Review of systems- Review of Systems  Constitutional: Positive for malaise/fatigue. Negative for chills, fever and weight loss.  HENT: Negative for congestion, ear discharge and nosebleeds.   Eyes: Negative for blurred vision.  Respiratory: Negative for cough, hemoptysis, sputum production, shortness of breath and wheezing.   Cardiovascular: Negative for chest pain, palpitations, orthopnea and claudication.  Gastrointestinal: Negative for abdominal pain, blood in stool, constipation, diarrhea, heartburn, melena, nausea and vomiting.  Genitourinary: Negative for dysuria, flank pain, frequency, hematuria and urgency.  Musculoskeletal: Negative for back pain, joint pain and myalgias.  Skin: Negative for rash.  Neurological: Negative for dizziness, tingling, focal weakness, seizures, weakness and headaches.  Endo/Heme/Allergies: Does not bruise/bleed easily.  Psychiatric/Behavioral: Negative for depression and suicidal ideas. The patient does not have insomnia.      No Known Allergies   Past Medical History:  Diagnosis Date  . Abnormal prostate specific antigen 08/08/2012  . Adiposity 04/16/2015  . Chronic kidney disease (CKD), stage III (moderate) 11/27/2016  . CVA (cerebral vascular accident) ([place]) 06/17/2016  . Diabetes mellitus without complication ([place])   . Diverticulosis of sigmoid colon 04/16/2015  . Dyslipidemia 03/18/2015  . Hemorrhoids, internal 04/16/2015  . Hypercholesteremia 04/16/2015  . Hyperlipidemia   . Hypertension   . Primary cancer of left upper lobe of lung ([place])   . Pulmonary embolism ([place])   . Wears dentures    partial upper     Past Surgical History:  Procedure Laterality Date  . COLONOSCOPY    . COLONOSCOPY WITH PROPOFOL N/A 05/06/2015   Procedure: COLONOSCOPY WITH PROPOFOL;  Surgeon: Lucilla Lame, MD;  Location: [place];  Service: Endoscopy;  Laterality: N/A;  ASCENDING COLON  POLYPS X 2 TERMINAL ILEUM BIOPSY  RANDOM COLON BX. TRANSVERSE COLON POLYP SIGMOID COLON POLYP  . ESOPHAGOGASTRODUODENOSCOPY (EGD) WITH PROPOFOL N/A 05/06/2015   Procedure: ESOPHAGOGASTRODUODENOSCOPY (EGD) WITH PROPOFOL;  Surgeon: Lucilla Lame, MD;  Location: [place];  Service: Endoscopy;  Laterality: N/A;  GASTRIC BIOPSY X1  . KIDNEY STONE SURGERY  2017    Social History   Socioeconomic History  . Marital status: Married    Spouse name: Melissa   . Number of children: 3  . Years of education: Not on file  . Highest education level: Not on file  Occupational History  . Occupation: disable     Comment: from CVA and lung cancer  Social Needs  . Financial resource strain: Somewhat hard  . Food insecurity    Worry: Never true    Inability: Never true  . Transportation needs    Medical: No    Non-medical: No  Tobacco Use  . Smoking status: Never Smoker  . Smokeless tobacco: Never Used  Substance and Sexual Activity  . Alcohol use: No    Alcohol/week: 0.0 standard drinks  . Drug use: No  . Sexual activity: Yes    Partners: Female  Lifestyle  . Physical activity    Days per week: 3 days    Minutes per session: 30 min  . Stress: Not at all  Relationships  . Social connections    Talks on phone: More than three times a week    Gets together: Three times a week    Attends religious service: More than 4 times per year    Active member of club or organization: No    Attends meetings of clubs or organizations: Never    Relationship status: Married  . Intimate partner violence    Fear of current or ex partner: No    Emotionally abused: No    Physically abused: No    Forced sexual activity: No  Other Topics Concern  . Not on file  Social History Narrative   Used to work until 2 years ago when got sick with DVT leg , PE , CVA and found out he had lung cancer. He has medicare now     Family History  Problem Relation Age of Onset  . Diabetes Mother   . Diabetes Father   . CAD Father   .  Dementia Father   . Diabetes Sister   . Cancer Maternal Uncle        Prostate  . Cancer Cousin        prostate     Current Outpatient Medications:  .  atorvastatin (LIPITOR) 40 MG tablet, Take 1 tablet (40 mg total) by mouth 3 (three) times a week., Disp: 48 tablet, Rfl: 1 .  blood glucose meter kit and supplies, Dispense based on patient and insurance preference (Accuchek Aviva Plus, Accuchek Nano). Use up to four times daily as directed. (FOR ICD-10 E10.9, E11.9)., Disp: 1 each, Rfl: 0 .  entrectinib (ROZLYTREK) 200 MG capsule, Take 3 capsules (600 mg total) by mouth daily., Disp: 90 capsule, Rfl: 6 .  finasteride (PROSCAR) 5 MG tablet, Take 1 tablet (5 mg total) by mouth daily., Disp: 90 tablet, Rfl: 3 .  insulin degludec (TRESIBA FLEXTOUCH) 100 UNIT/ML SOPN FlexTouch Pen, Inject 0.26 mLs (26 Units total) into the skin daily., Disp: 15 mL, Rfl: 0 .  metFORMIN (GLUCOPHAGE) 500 MG tablet, Take 1-2 tablets (500-1,000 mg total) by mouth 2 (two) times daily with a meal. (Patient taking  differently: Take 500-1,000 mg by mouth 2 (two) times daily with a meal. 1 TID), Disp: 270 tablet, Rfl: 0 .  nystatin (MYCOSTATIN) 100000 UNIT/ML suspension, Take 5 mLs (500,000 Units total) by mouth 4 (four) times daily., Disp: 60 mL, Rfl: 0 .  ondansetron (ZOFRAN) 8 MG tablet, One pill every 8 hours as needed for nausea/vomitting., Disp: 40 tablet, Rfl: 1 .  rivaroxaban (XARELTO) 20 MG TABS tablet, TAKE 1 TABLET BY MOUTH ONCE DAILY WITH  SUPPER, Disp: 30 tablet, Rfl: 6 .  tamsulosin (FLOMAX) 0.4 MG CAPS capsule, Take 1 capsule (0.4 mg total) by mouth every morning., Disp: 90 capsule, Rfl: 3 .  dexamethasone (DECADRON) 4 MG tablet, Take 1 tablet (4 mg total) by mouth daily. (Patient not taking: Reported on 07/03/2019), Disp: 20 tablet, Rfl: 0 .  lisinopril (ZESTRIL) 5 MG tablet, Take 1 tablet (5 mg total) by mouth daily. (Patient not taking: Reported on 07/03/2019), Disp: 90 tablet, Rfl: 1  Physical exam:   Vitals:   07/04/19 1311  BP: 135/87  Pulse: 75  Temp: 97.8 F (36.6 C)  TempSrc: Tympanic  Weight: 159 lb (72.1 kg)  Height: '5\' 4"'  (1.626 m)   Physical Exam   CMP Latest Ref Rng & Units 07/04/2019  Glucose 70 - 99 mg/dL 229(H)  BUN 8 - 23 mg/dL 16  Creatinine 0.61 - 1.24 mg/dL 1.72(H)  Sodium 135 - 145 mmol/L 141  Potassium 3.5 - 5.1 mmol/L 4.0  Chloride 98 - 111 mmol/L 108  CO2 22 - 32 mmol/L 22  Calcium 8.9 - 10.3 mg/dL 9.6  Total Protein 6.5 - 8.1 g/dL 7.0  Total Bilirubin 0.3 - 1.2 mg/dL 0.8  Alkaline Phos 38 - 126 U/L 47  AST 15 - 41 U/L 22  ALT 0 - 44 U/L 25   CBC Latest Ref Rng & Units 07/04/2019  WBC 4.0 - 10.5 K/uL 3.9(L)  Hemoglobin 13.0 - 17.0 g/dL 13.5  Hematocrit 39.0 - 52.0 % 40.1  Platelets 150 - 400 K/uL 237    No images are attached to the encounter.  No results found.   Assessmen and plan- Patient is a 68 y.o. male with Stage IV lung cancer currently on entrectenib hre for routine follow up  1. Patient is tolerating 400 mg entrectenib well without any dizziness. He will continue the same  2. H/o bilateral PE on xarelto  3. Stage III CKD stable  RTC in 4 weeks. Labs: cbc with diff/cmp. Will getmri brain and ct chest/abdomen pelvis without contrast prior   Visit Diagnosis 1. Primary cancer of left upper lobe of lung ([place])   2. High risk medication use      Dr. Randa Evens, MD, MPH [facility] at [facility] [phone] 07/09/2019 2:55 PM

## 2019-07-25 ENCOUNTER — Ambulatory Visit
Admission: RE | Admit: 2019-07-25 | Discharge: 2019-07-25 | Disposition: A | Discharge status: Home / Self Care | Payer: Medicare Other | Source: Ambulatory Visit | Attending: Oncology | Admitting: Oncology

## 2019-07-25 ENCOUNTER — Other Ambulatory Visit: Payer: Self-pay

## 2019-07-25 ENCOUNTER — Telehealth: Payer: Self-pay | Admitting: *Deleted

## 2019-07-25 ENCOUNTER — Telehealth: Payer: Self-pay | Admitting: Internal Medicine

## 2019-07-25 DIAGNOSIS — C3490 Malignant neoplasm of unspecified part of unspecified bronchus or lung: Secondary | ICD-10-CM | POA: Diagnosis not present

## 2019-07-25 DIAGNOSIS — C3412 Malignant neoplasm of upper lobe, left bronchus or lung: Secondary | ICD-10-CM

## 2019-07-25 DIAGNOSIS — C7931 Secondary malignant neoplasm of brain: Secondary | ICD-10-CM | POA: Diagnosis not present

## 2019-07-25 MED ORDER — GADOBUTROL 1 MMOL/ML IV SOLN
7.0000 mL | Freq: Once | INTRAVENOUS | Status: AC | PRN
  Administered 2019-07-25: 7 mL via INTRAVENOUS

## 2019-07-25 NOTE — Telephone Encounter (Signed)
Fyi.

## 2019-07-25 NOTE — Telephone Encounter (Signed)
I tried to reach the patient number x2-voicemail box full-can leave messages.  H/T-please reach the patient to discuss the results/inform when patient available.

## 2019-07-25 NOTE — Telephone Encounter (Signed)
Called report  IMPRESSION: 1. Interval development of ground-glass in the right lower lobe is nonspecific, may represent mild pneumonitis. Clinical correlation is suggested. 2. 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 3. No signs of metastatic disease to the abdomen is likely with improved steatosis when compared to prior study. 4. No other interval changes.  Electronically Signed: By: Zetta Bills M.D. On: 07/25/2019 10:38

## 2019-07-26 ENCOUNTER — Ambulatory Visit: Payer: Medicare Other | Admitting: Pharmacist

## 2019-07-26 ENCOUNTER — Telehealth: Payer: Self-pay | Admitting: Internal Medicine

## 2019-07-26 ENCOUNTER — Telehealth: Payer: Self-pay | Admitting: *Deleted

## 2019-07-26 DIAGNOSIS — N183 Chronic kidney disease, stage 3 unspecified: Secondary | ICD-10-CM

## 2019-07-26 NOTE — Telephone Encounter (Signed)
Dr. Rogue Bussing spoke with patient.

## 2019-07-26 NOTE — Telephone Encounter (Addendum)
Melvin Willis -see phone note from  Dr. Jacinto Reap from yesterday.  "I tried to reach the patient number x2-voicemail box full-can leave messages.  H/T-please reach the patient to discuss the results/inform when patient available."

## 2019-07-26 NOTE — Telephone Encounter (Signed)
Spoke to patient regarding the results of the CT scan/MRI brain-MRI brain left temporal lobe increasing-question radiation necrosis.  Left a message for Dr. Donella Stade to discuss

## 2019-07-26 NOTE — Telephone Encounter (Signed)
He called this morning asking to speak with Nira Conn to get results. Please return his call

## 2019-07-26 NOTE — Telephone Encounter (Signed)
Patient called asking for results of scans yesterday  IMPRESSION: Progression of enhancing lesion in the left temporal lobe compared with the prior MRI. This has irregular contour and central necrosis with considerable surrounding edema. The configuration of the lesion raises the possibility of radiation necrosis however progressive metastatic disease is possible and close follow-up is recommended.  Multiple additional enhancing lesions are stable to improved. Left basal ganglia lesion may be slightly larger.  Chronic hemorrhagic infarcts in the parietal lobe bilaterally.   Electronically Signed   By: Franchot Gallo M.D.   On: 07/25/2019 11:48  IMPRESSION: 1. Interval development of ground-glass in the right lower lobe is nonspecific, may represent mild pneumonitis. Clinical correlation is suggested. 2. 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 3. No signs of metastatic disease to the abdomen is likely with improved steatosis when compared to prior study. 4. No other interval changes.  Electronically Signed: By: Zetta Bills M.D. On: 07/25/2019 10:38

## 2019-07-27 ENCOUNTER — Telehealth: Payer: Self-pay | Admitting: Internal Medicine

## 2019-07-27 NOTE — Telephone Encounter (Signed)
Spoke to Dr.Crystal- reviewed the MRI of brain. Clinically suspicious of radiation changes rather than true progression. Will discuss at tumor conference.

## 2019-07-28 ENCOUNTER — Other Ambulatory Visit: Payer: Self-pay | Admitting: Family Medicine

## 2019-07-28 DIAGNOSIS — N183 Chronic kidney disease, stage 3 unspecified: Secondary | ICD-10-CM

## 2019-07-28 DIAGNOSIS — E1122 Type 2 diabetes mellitus with diabetic chronic kidney disease: Secondary | ICD-10-CM

## 2019-07-28 NOTE — Telephone Encounter (Signed)
Pt calling for samples of  insulin degludec (TRESIBA FLEXTOUCH) 100 UNIT/ML SOPN FlexTouch Pen  Pt states he only has enough for tomorrow.

## 2019-07-28 NOTE — Chronic Care Management (AMB) (Signed)
  Chronic Care Management   Care Coordination Note  07/28/2019 Name: Melvin Willis MRN: [id number] DOB: Apr 21, 1951  Care coordination: Outreach to [employer] to check status of patient's [place] application. 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 Printed and faxed copies of Melvin Willis cards to [employer], requested that his application be expedited.   Returned call to  [place] at Dr. Darlin Priestly office and left voicemail.   Goals Addressed            This Visit's Progress   . Diabetes (pt-stated)       Current Barriers:  . Dexamethasone for chemotherapy is significantly increasing blood sugars . Financial barriers  Clinical Pharmacist Goal(s):  Over the next 90 days, patient will demonstrate improved adherence to prescribed treatment plan for diabetes self care/management as evidenced by:  . daily monitoring and recording of CBG  . Lowered A1c   Over the next 14 days, Melvin Willis will provide the necessary supplementary documents (proof of out of pocket prescription expenditure, proof of household income) needed for medication assistance applications to CCM pharmacist.    Interventions:  . Reviewed medications with patient and discussed importance of medication adherence . Dr. Ancil Boozer to prescribe OneTouch glucometer, strips, and lancets to [facility] on [address]. . Provided patient with [employer] application for Tresiba insulin o Updated 9/15: reviewed proper documents needed, reviewed availability of sample o Updated 1/95: application completed, Melvin Willis dropping off tomorrow at [facility] o Updated 10/6: patient called because he is almost out of [place]. Directed patient to call the practice, reviewed that he still has not turned in [employer] application o Updated 09/32: patient returned missing [employer] application elements; submitted application  o  Updated 11/18: contacted [employer] regarding patient's application; faxed in insurance cards   Patient Self Care Activities:  . Self administers oral medications as prescribed . Self administers insulin as prescribed . Checks blood sugars as prescribed and utilize hyper and hypoglycemia protocol as needed  Please see past updates related to this goal by clicking on the "Past Updates" button in the selected goal          Follow up plan: Telephone follow up appointment with care management team member scheduled for: Tuesday Nov 24  Ruben Reason, PharmD Clinical Pharmacist [facility] [phone]

## 2019-07-30 ENCOUNTER — Telehealth: Payer: Self-pay | Admitting: Internal Medicine

## 2019-07-30 MED ORDER — TRESIBA FLEXTOUCH 100 UNIT/ML ~~LOC~~ SOPN: 26.0000 [IU] | PEN_INJECTOR | Freq: Every day | SUBCUTANEOUS | 0 refills | Status: DC

## 2019-07-30 NOTE — Telephone Encounter (Signed)
Late entry-on 11/20-I spoke to patient regarding my discussion with Dr. 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  Await evaluation clinic.

## 2019-08-01 ENCOUNTER — Other Ambulatory Visit: Payer: Self-pay

## 2019-08-01 ENCOUNTER — Inpatient Hospital Stay: Payer: Medicare Other | Attending: Internal Medicine

## 2019-08-01 ENCOUNTER — Inpatient Hospital Stay (HOSPITAL_BASED_OUTPATIENT_CLINIC_OR_DEPARTMENT_OTHER): Payer: Medicare Other | Admitting: Internal Medicine

## 2019-08-01 VITALS — BP 138/81 | HR 95 | Temp 97.8°F | Wt 162.0 lb

## 2019-08-01 DIAGNOSIS — E7800 Pure hypercholesterolemia, unspecified: Secondary | ICD-10-CM | POA: Diagnosis not present

## 2019-08-01 DIAGNOSIS — C3412 Malignant neoplasm of upper lobe, left bronchus or lung: Secondary | ICD-10-CM | POA: Diagnosis not present

## 2019-08-01 DIAGNOSIS — Z794 Long term (current) use of insulin: Secondary | ICD-10-CM | POA: Insufficient documentation

## 2019-08-01 DIAGNOSIS — N183 Chronic kidney disease, stage 3 unspecified: Secondary | ICD-10-CM | POA: Diagnosis not present

## 2019-08-01 DIAGNOSIS — Z818 Family history of other mental and behavioral disorders: Secondary | ICD-10-CM | POA: Diagnosis not present

## 2019-08-01 DIAGNOSIS — Z8673 Personal history of transient ischemic attack (TIA), and cerebral infarction without residual deficits: Secondary | ICD-10-CM | POA: Diagnosis not present

## 2019-08-01 DIAGNOSIS — R5383 Other fatigue: Secondary | ICD-10-CM | POA: Insufficient documentation

## 2019-08-01 DIAGNOSIS — R42 Dizziness and giddiness: Secondary | ICD-10-CM | POA: Insufficient documentation

## 2019-08-01 DIAGNOSIS — C7951 Secondary malignant neoplasm of bone: Secondary | ICD-10-CM | POA: Insufficient documentation

## 2019-08-01 DIAGNOSIS — Z833 Family history of diabetes mellitus: Secondary | ICD-10-CM | POA: Insufficient documentation

## 2019-08-01 DIAGNOSIS — Z86010 Personal history of colonic polyps: Secondary | ICD-10-CM | POA: Insufficient documentation

## 2019-08-01 DIAGNOSIS — Z7189 Other specified counseling: Secondary | ICD-10-CM

## 2019-08-01 DIAGNOSIS — C7931 Secondary malignant neoplasm of brain: Secondary | ICD-10-CM | POA: Diagnosis not present

## 2019-08-01 DIAGNOSIS — I82402 Acute embolism and thrombosis of unspecified deep veins of left lower extremity: Secondary | ICD-10-CM | POA: Diagnosis not present

## 2019-08-01 DIAGNOSIS — Z79899 Other long term (current) drug therapy: Secondary | ICD-10-CM | POA: Insufficient documentation

## 2019-08-01 DIAGNOSIS — Z8249 Family history of ischemic heart disease and other diseases of the circulatory system: Secondary | ICD-10-CM | POA: Insufficient documentation

## 2019-08-01 DIAGNOSIS — E785 Hyperlipidemia, unspecified: Secondary | ICD-10-CM | POA: Insufficient documentation

## 2019-08-01 DIAGNOSIS — H900 Conductive hearing loss, bilateral: Secondary | ICD-10-CM

## 2019-08-01 DIAGNOSIS — Z8719 Personal history of other diseases of the digestive system: Secondary | ICD-10-CM | POA: Insufficient documentation

## 2019-08-01 DIAGNOSIS — E1122 Type 2 diabetes mellitus with diabetic chronic kidney disease: Secondary | ICD-10-CM | POA: Diagnosis not present

## 2019-08-01 DIAGNOSIS — Z86711 Personal history of pulmonary embolism: Secondary | ICD-10-CM | POA: Diagnosis not present

## 2019-08-01 DIAGNOSIS — Z7901 Long term (current) use of anticoagulants: Secondary | ICD-10-CM | POA: Diagnosis not present

## 2019-08-01 DIAGNOSIS — Z8042 Family history of malignant neoplasm of prostate: Secondary | ICD-10-CM | POA: Insufficient documentation

## 2019-08-01 LAB — COMPREHENSIVE METABOLIC PANEL
ALT: 21 U/L (ref 0–44)
AST: 22 U/L (ref 15–41)
Albumin: 4.4 g/dL (ref 3.5–5.0)
Alkaline Phosphatase: 48 U/L (ref 38–126)
Anion gap: 9 (ref 5–15)
BUN: 21 mg/dL (ref 8–23)
CO2: 23 mmol/L (ref 22–32)
Calcium: 9.4 mg/dL (ref 8.9–10.3)
Chloride: 106 mmol/L (ref 98–111)
Creatinine, Ser: 1.76 mg/dL — ABNORMAL HIGH (ref 0.61–1.24)
GFR calc Af Amer: 46 mL/min — ABNORMAL LOW (ref >60)
GFR calc non Af Amer: 40 mL/min — ABNORMAL LOW (ref >60)
Glucose, Bld: 165 mg/dL — ABNORMAL HIGH (ref 70–99)
Potassium: 4.4 mmol/L (ref 3.5–5.1)
Sodium: 138 mmol/L (ref 135–145)
Total Bilirubin: 0.7 mg/dL (ref 0.3–1.2)
Total Protein: 7.3 g/dL (ref 6.5–8.1)

## 2019-08-01 LAB — CBC WITH DIFFERENTIAL/PLATELET
Abs Immature Granulocytes: 0.01 10*3/uL (ref 0.00–0.07)
Basophils Absolute: 0 10*3/uL (ref 0.0–0.1)
Basophils Relative: 0 %
Eosinophils Absolute: 0.2 10*3/uL (ref 0.0–0.5)
Eosinophils Relative: 3 %
HCT: 38.3 % — ABNORMAL LOW (ref 39.0–52.0)
Hemoglobin: 12.7 g/dL — ABNORMAL LOW (ref 13.0–17.0)
Immature Granulocytes: 0 %
Lymphocytes Relative: 38 %
Lymphs Abs: 1.7 10*3/uL (ref 0.7–4.0)
MCH: 29.6 pg (ref 26.0–34.0)
MCHC: 33.2 g/dL (ref 30.0–36.0)
MCV: 89.3 fL (ref 80.0–100.0)
Monocytes Absolute: 0.6 10*3/uL (ref 0.1–1.0)
Monocytes Relative: 13 %
Neutro Abs: 2.1 10*3/uL (ref 1.7–7.7)
Neutrophils Relative %: 46 %
Platelets: 210 10*3/uL (ref 150–400)
RBC: 4.29 MIL/uL (ref 4.22–5.81)
RDW: 15.1 % (ref 11.5–15.5)
WBC: 4.5 10*3/uL (ref 4.0–10.5)
nRBC: 0 % (ref 0.0–0.2)

## 2019-08-01 NOTE — Progress Notes (Signed)
[place] OFFICE PROGRESS NOTE  Patient Care Team: [name], [name], MD as Medical Oncologist (Medical Oncology) Cathi Roan, [facility] (Pharmacist)  Cancer Staging No matching staging information was found for the patient.   Oncology History Overview Note  # OCT 2017- ADENO CA LUNG; STAGE IV [; LUL; bil supraclavicular LN; Left neck LN Bx]; ROS-1 MUTATED; s/p Carbo-alimta x1[oct 2017]  # NOV 1st 2017- XALKORI 250 mg BID; JAN 15th CT- PR;  # AUG 27th Chemo-RT to persistent LUL/mediastinal LN [s/p carbo-taxol- with RT; finished Sep 22nd 2018];   # July 14th 2020- multiple metastatic lesions of the brain [July 30 whole brain radiation finished April 21, 2019]; bone scan skull metastases /posterior left ninth rib metastases; April 04, 2019 stopped crizotinib;  #April 25, 2019-start Entrectinib 600 mg once a day  # LLE DVT/bil PE/Multiple strokes [? On xarelto]-Lovenox; Jan mid 2018- xarelto [lovenox-insurance issues]  # MRI brain- multiple infarcts [2d echo/bubble study-NEG's/p Neurology eval]  ------------------------------------------------------------    # # s/p TURP [Sep 2017; Dr.Cope] DEC 26th CT- distended bladder   # MOLECULAR TESTING- ROS-1 POSITIVE; ALK/EGFR-NEG; PDL-1 EXPRESSION- 90%** [HIGH] -----------------------------------------------------------------    DIAGNOSIS: Adenocarcinoma the lung  ROS-1 +  STAGE:  IV    ;GOALS: Palliative  CURRENT/MOST RECENT THERAPY-r whole brain radiation   Primary cancer of left upper lobe of lung ([place])  08/09/2019 -  Chemotherapy   The patient had bevacizumab-bvzr (ZIRABEV) 700 mg in sodium chloride 0.9 % 100 mL chemo infusion, 10 mg/kg = 700 mg, Intravenous,  Once, 0 of 6 cycles  for chemotherapy treatment.        INTERVAL HISTORY:  Melvin Willis 68 y.o.  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   Patient was recently on re- started on entrectinib started about 3 weeks ago.  Patient is here to review the results of the MRI brain/CT chest.  Patient continues to complain of dizziness.  No falls.  Also complains of hard of hearing right more than left.  As per his wife patient having difficulty with expression of his speech; seems to be cognitively impaired.  Review of Systems  Constitutional: Positive for malaise/fatigue. Negative for chills, diaphoresis, fever and weight loss.  HENT: Negative for nosebleeds and sore throat.   Eyes: Negative for double vision.  Respiratory: Negative for cough, hemoptysis, sputum production, shortness of breath and wheezing.   Cardiovascular: Positive for leg swelling. Negative for chest pain (Left upper chest wall pain chronic), palpitations and orthopnea.  Gastrointestinal: Negative for abdominal pain, blood in stool, constipation, diarrhea, heartburn and melena.  Genitourinary: Negative for dysuria.  Musculoskeletal: Negative for back pain and joint pain.  Skin: Negative.  Negative for itching and rash.  Neurological: Positive for dizziness. Negative for tingling, focal weakness and weakness.  Endo/Heme/Allergies: Does not bruise/bleed easily.  Psychiatric/Behavioral: Positive for memory loss. Negative for depression. The patient is not nervous/anxious and does not have insomnia.     PAST MEDICAL HISTORY :  Past Medical History:  Diagnosis Date  . Abnormal prostate specific antigen 08/08/2012  . Adiposity 04/16/2015  . Chronic kidney disease (CKD), stage III (moderate) 11/27/2016  . CVA (cerebral vascular accident) ([place]) 06/17/2016  . Diabetes mellitus without complication ([place])   . Diverticulosis of sigmoid colon 04/16/2015  . Dyslipidemia 03/18/2015  . Hemorrhoids, internal 04/16/2015  . Hypercholesteremia 04/16/2015  . Hyperlipidemia   . Hypertension   . Primary  cancer of left upper lobe of lung ([place])   . Pulmonary embolism ([place])   . Wears dentures     partial upper    PAST SURGICAL HISTORY :   Past Surgical History:  Procedure Laterality Date  . COLONOSCOPY    . COLONOSCOPY WITH PROPOFOL N/A 05/06/2015   Procedure: COLONOSCOPY WITH PROPOFOL;  Surgeon: Lucilla Lame, MD;  Location: [place];  Service: Endoscopy;  Laterality: N/A;  ASCENDING COLON POLYPS X 2 TERMINAL ILEUM BIOPSY RANDOM COLON BX. TRANSVERSE COLON POLYP SIGMOID COLON POLYP  . ESOPHAGOGASTRODUODENOSCOPY (EGD) WITH PROPOFOL N/A 05/06/2015   Procedure: ESOPHAGOGASTRODUODENOSCOPY (EGD) WITH PROPOFOL;  Surgeon: Lucilla Lame, MD;  Location: [place];  Service: Endoscopy;  Laterality: N/A;  GASTRIC BIOPSY X1  . KIDNEY STONE SURGERY  2017    FAMILY HISTORY :   Family History  Problem Relation Age of Onset  . Diabetes Mother   . Diabetes Father   . CAD Father   . Dementia Father   . Diabetes Sister   . Cancer Maternal Uncle        Prostate  . Cancer Cousin        prostate    SOCIAL HISTORY:   Social History   Tobacco Use  . Smoking status: Never Smoker  . Smokeless tobacco: Never Used  Substance Use Topics  . Alcohol use: No    Alcohol/week: 0.0 standard drinks  . Drug use: No    ALLERGIES:  has No Known Allergies.  MEDICATIONS:  Current Outpatient Medications  Medication Sig Dispense Refill  . atorvastatin (LIPITOR) 40 MG tablet Take 1 tablet (40 mg total) by mouth 3 (three) times a week. 48 tablet 1  . blood glucose meter kit and supplies Dispense based on patient and insurance preference (Accuchek Aviva Plus, Accuchek Nano). Use up to four times daily as directed. (FOR ICD-10 E10.9, E11.9). 1 each 0  . entrectinib (ROZLYTREK) 200 MG capsule Take 3 capsules (600 mg total) by mouth daily. 90 capsule 6  . insulin degludec (TRESIBA FLEXTOUCH) 100 UNIT/ML SOPN FlexTouch Pen Inject 0.26 mLs (26 Units total) into the skin daily. 15 mL 0  . metFORMIN (GLUCOPHAGE) 500 MG tablet Take 1-2 tablets (500-1,000 mg total) by mouth 2 (two) times  daily with a meal. (Patient taking differently: Take 500-1,000 mg by mouth 2 (two) times daily with a meal. 1 TID) 270 tablet 0  . ondansetron (ZOFRAN) 8 MG tablet One pill every 8 hours as needed for nausea/vomitting. 40 tablet 1  . rivaroxaban (XARELTO) 20 MG TABS tablet TAKE 1 TABLET BY MOUTH ONCE DAILY WITH  SUPPER 30 tablet 6  . tamsulosin (FLOMAX) 0.4 MG CAPS capsule Take 1 capsule (0.4 mg total) by mouth every morning. 90 capsule 3  . dexamethasone (DECADRON) 4 MG tablet Take 1 tablet (4 mg total) by mouth daily. (Patient not taking: Reported on 07/03/2019) 20 tablet 0  . finasteride (PROSCAR) 5 MG tablet Take 1 tablet (5 mg total) by mouth daily. (Patient not taking: Reported on 08/01/2019) 90 tablet 3  . lisinopril (ZESTRIL) 5 MG tablet Take 1 tablet (5 mg total) by mouth daily. (Patient not taking: Reported on 07/03/2019) 90 tablet 1  . nystatin (MYCOSTATIN) 100000 UNIT/ML suspension Take 5 mLs (500,000 Units total) by mouth 4 (four) times daily. (Patient not taking: Reported on 08/01/2019) 60 mL 0   No current facility-administered medications for this visit.     PHYSICAL EXAMINATION: ECOG PERFORMANCE STATUS: 0 - Asymptomatic  BP 138/81 (BP  Location: Left Arm, Patient Position: Sitting, Cuff Size: Normal)   Pulse 95   Temp 97.8 F (36.6 C) (Tympanic)   Wt 162 lb (73.5 kg)   SpO2 97%   BMI 27.81 kg/m   Filed Weights   08/01/19 1320  Weight: 162 lb (73.5 kg)    Physical Exam  Constitutional: He is oriented to person, place, and time and well-developed, well-nourished, and in no distress.  Accompanied by his wife. He is walking by himself.  HENT:  Head: Normocephalic and atraumatic.  Mouth/Throat: Oropharynx is clear and moist. No oropharyngeal exudate.  Eyes: Pupils are equal, round, and reactive to light.  Neck: Normal range of motion. Neck supple.  Cardiovascular: Normal rate and regular rhythm.  Pulmonary/Chest: No respiratory distress. He has no wheezes.   Abdominal: Soft. Bowel sounds are normal. He exhibits no distension and no mass. There is no abdominal tenderness. There is no rebound and no guarding.  Musculoskeletal: Normal range of motion.        General: No tenderness or edema.  Neurological: He is alert and oriented to person, place, and time.  Skin: Skin is warm.  Psychiatric: Affect normal.   LABORATORY DATA:  I have reviewed the data as listed    Component Value Date/Time   NA 138 08/01/2019 1304   NA 139 01/01/2016 1000   K 4.4 08/01/2019 1304   CL 106 08/01/2019 1304   CO2 23 08/01/2019 1304   GLUCOSE 165 (H) 08/01/2019 1304   BUN 21 08/01/2019 1304   BUN 13 01/01/2016 1000   CREATININE 1.76 (H) 08/01/2019 1304   CREATININE 1.32 (H) 08/22/2018 1107   CALCIUM 9.4 08/01/2019 1304   PROT 7.3 08/01/2019 1304   PROT 7.2 01/01/2016 1000   ALBUMIN 4.4 08/01/2019 1304   ALBUMIN 4.4 01/01/2016 1000   AST 22 08/01/2019 1304   ALT 21 08/01/2019 1304   ALKPHOS 48 08/01/2019 1304   BILITOT 0.7 08/01/2019 1304   BILITOT 0.4 01/01/2016 1000   GFRNONAA 40 (L) 08/01/2019 1304   GFRNONAA 56 (L) 08/22/2018 1107   GFRAA 46 (L) 08/01/2019 1304   GFRAA 65 08/22/2018 1107    No results found for: SPEP, UPEP  Lab Results  Component Value Date   WBC 4.5 08/01/2019   NEUTROABS 2.1 08/01/2019   HGB 12.7 (L) 08/01/2019   HCT 38.3 (L) 08/01/2019   MCV 89.3 08/01/2019   PLT 210 08/01/2019      Chemistry      Component Value Date/Time   NA 138 08/01/2019 1304   NA 139 01/01/2016 1000   K 4.4 08/01/2019 1304   CL 106 08/01/2019 1304   CO2 23 08/01/2019 1304   BUN 21 08/01/2019 1304   BUN 13 01/01/2016 1000   CREATININE 1.76 (H) 08/01/2019 1304   CREATININE 1.32 (H) 08/22/2018 1107      Component Value Date/Time   CALCIUM 9.4 08/01/2019 1304   ALKPHOS 48 08/01/2019 1304   AST 22 08/01/2019 1304   ALT 21 08/01/2019 1304   BILITOT 0.7 08/01/2019 1304   BILITOT 0.4 01/01/2016 1000       RADIOGRAPHIC STUDIES: I have  personally reviewed the radiological images as listed and agreed with the findings in the report. No results found.   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  MRI brain-overall improved except increasing left temporal lesion [see discussion below] patient currently on Rozyltrek 2pills.  #Given the continued dizziness-recommend holding Rozyltrek at this time.  #MRI-November 2020-s/p whole brain radiation [April 21, 2019]-shows overall improvement; except for worsening ~2.5 cm left temporal lesion-question true progression versus pseudoprogression/radiation necrosis.  Clinically suspect pseudoprogression.  Discussed regarding steroids vs. Avastin.  Patient has poor tolerance to steroids [blood sugars 400s etc.].  Recommend Avastin every 2 weeks x4-6 treatments and then re- evaluate response with MRI.  Reviewed the rationale for using Avastin. Discussed the potential side effects including but not limited to elevated blood pressure ; nephrotic syndrome wound healing problems.   # Difficulty hearing-no cerumen impaction noted.  Will refer to ENT.  # Dizziness-unclear etiology-question brain metastases versus entrectinib [stop today] vs. Orthostasis.  Hold off antihypertensives.   # CKD-stage III multifactorial [prostatitic obstruction/diabetes] STABLE.  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   # Palliative care: Discussed the goal of treatment being palliative.  Wife questioned patient's progression of disease if he did not do any further therapies.  Discussed that he will likely die of progressive disease in the brain.  My very concerned about the side effects of Avastin.  However patient wants to proceed with treatment.  We will make a referral  to palliative care at next visit.  # DISPOSITION: # Referral to ENT- Dx: hearing loss # in 1 week- Avastin- cbc/bmp/UA # referral to [facility] in 3 weeks-Dx:  lung cancer # follow up in 3 weeks-MD;labs- cbc/cmp/UA avastin- Dr.B    Orders Placed This Encounter  Procedures  . Urinalysis, Complete w Microscopic    Standing Status:   Future    Standing Expiration Date:   07/31/2020  . Ambulatory referral to ENT    Referral Priority:   Routine    Referral Type:   Consultation    Referral Reason:   Specialty Services Required    Requested Specialty:   Otolaryngology    Number of Visits Requested:   1   All questions were answered. The patient knows to call the clinic with any problems, questions or concerns.      Cammie Sickle, MD 08/02/2019 7:52 AM

## 2019-08-01 NOTE — Assessment & Plan Note (Addendum)
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  MRI brain-overall improved except increasing left temporal lesion [see discussion below] patient currently on Rozyltrek 2pills.  #Given the continued dizziness-recommend holding Rozyltrek at this time.  #MRI-November 2020-s/p whole brain radiation [April 21, 2019]-shows overall improvement; except for worsening ~2.5 cm left temporal lesion-question true progression versus pseudoprogression/radiation necrosis.  Clinically suspect pseudoprogression.  Discussed regarding steroids vs. Avastin.  Patient has poor tolerance to steroids [blood sugars 400s etc.].  Recommend Avastin every 2 weeks x4-6 treatments and then re- evaluate response with MRI.  Reviewed the rationale for using Avastin. Discussed the potential side effects including but not limited to elevated blood pressure ; nephrotic syndrome wound healing problems.   # Difficulty hearing-no cerumen impaction noted.  Will refer to ENT.  # Dizziness-unclear etiology-question brain metastases versus entrectinib [stop today] vs. Orthostasis.  Hold off antihypertensives.   # CKD-stage III multifactorial [prostatitic obstruction/diabetes] STABLE.  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   # Palliative care: Discussed the goal of treatment being palliative.  Wife questioned patient's progression of disease if he did not do any further therapies.  Discussed that he will likely die of progressive disease in the brain.  My very concerned about the side effects of Avastin.  However patient wants to proceed with treatment.  We will make a referral to palliative care at next visit.  # DISPOSITION: # Referral to ENT- Dx: hearing loss # in 1 week- Avastin- cbc/bmp/UA # referral to [facility] in 3 weeks-Dx:  lung cancer # follow up in 3  weeks-MD;labs- cbc/cmp/UA avastin- Dr.B

## 2019-08-01 NOTE — Patient Instructions (Addendum)
#   STOP -ROZYLTREK/cancer pill; until further directed.

## 2019-08-02 DIAGNOSIS — Z7189 Other specified counseling: Secondary | ICD-10-CM | POA: Insufficient documentation

## 2019-08-07 ENCOUNTER — Ambulatory Visit (INDEPENDENT_AMBULATORY_CARE_PROVIDER_SITE_OTHER): Payer: Medicare Other | Admitting: Family Medicine

## 2019-08-07 ENCOUNTER — Encounter: Payer: Self-pay | Admitting: Family Medicine

## 2019-08-07 ENCOUNTER — Other Ambulatory Visit: Payer: Self-pay

## 2019-08-07 VITALS — Wt 167.0 lb

## 2019-08-07 DIAGNOSIS — H918X3 Other specified hearing loss, bilateral: Secondary | ICD-10-CM | POA: Diagnosis not present

## 2019-08-07 DIAGNOSIS — H6991 Unspecified Eustachian tube disorder, right ear: Secondary | ICD-10-CM

## 2019-08-07 DIAGNOSIS — C3490 Malignant neoplasm of unspecified part of unspecified bronchus or lung: Secondary | ICD-10-CM | POA: Diagnosis not present

## 2019-08-07 DIAGNOSIS — E1169 Type 2 diabetes mellitus with other specified complication: Secondary | ICD-10-CM

## 2019-08-07 DIAGNOSIS — Z8673 Personal history of transient ischemic attack (TIA), and cerebral infarction without residual deficits: Secondary | ICD-10-CM

## 2019-08-07 DIAGNOSIS — I82502 Chronic embolism and thrombosis of unspecified deep veins of left lower extremity: Secondary | ICD-10-CM

## 2019-08-07 DIAGNOSIS — N1831 Chronic kidney disease, stage 3a: Secondary | ICD-10-CM | POA: Diagnosis not present

## 2019-08-07 DIAGNOSIS — E785 Hyperlipidemia, unspecified: Secondary | ICD-10-CM

## 2019-08-07 MED ORDER — FLUTICASONE PROPIONATE 50 MCG/ACT NA SUSP: 2.0000 | Freq: Every day | NASAL | 2 refills | Status: DC

## 2019-08-07 MED ORDER — METFORMIN HCL 500 MG PO TABS: 500.0000 mg | ORAL_TABLET | Freq: Three times a day (TID) | ORAL | 0 refills | Status: DC

## 2019-08-07 NOTE — Progress Notes (Signed)
Name: Melvin Willis   MRN: [id number]    DOB: 1951/05/25   Date:08/07/2019       Progress Note  Subjective  Chief Complaint  Chief Complaint  Patient presents with  . Diabetes  . Hyperlipidemia  . DVT  . Ear Problem    Feels fullness in both of his ears x 2 weeks. He denies pain.   . Lung Cancer    I connected with  [name] on 08/07/19 at 11:40 AM EST by telephone and verified that I am speaking with the correct person using two identifiers.  I discussed the limitations, risks, security and privacy concerns of performing an evaluation and management service by telephone and the availability of in person appointments. Staff also discussed with the patient that there may be a patient responsible charge related to this service. Patient Location: at home Provider Location: [facility] Additional Individuals present: wife   HPI  DMII: hestill takingmetformin and has been able to tolerate TID we added , and off Glipizide - stopped it on his own, off lisinopril - wife states stopped by Dr. Janeth Rase last A1C was   9.8%  urine micro is 50 He is taking Atorvastatin 3 times a week, glucose in am's around 100-120's, at night in the 200 's range, off [place] because of cost ( out of medication for a few weeks). He denies polyphagia, polydipsia or polyuria   Chronic DVT left leg, history of PE : on long term Xarelto, rx given by Hematologist. He denies easy bruising. He was diagnosed with DVT and PE prior to cancer diagnosis back in 2017. He has some leg edema and wears compression stocking hoses daily to control symptoms. No blood in stools.   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 Mickel Fuchs and Dr. Baruch Gouty, started on decadron and complete brain radiation in October and developed hearing loss afterwards   History of CVA: initially had  dizziness and left side weakness, but no sequela now, still on statin and also on Xarelto, only takes statins a few times a weekand last lipid panel was at goal, we will recheck when he returns in person for another visit   Hearing loss: going on for the past month, he tried Sudafed and initially improved symtoms but no longer helping,  seen by Dr. Burlene Arnt end of Nov and normal exam and was referred to ENT. Advised to resume nasal spray  Patient Active Problem List   Diagnosis Date Noted  . Goals of care, counseling/discussion 08/02/2019  . Primary malignant neoplasm of lung metastatic to other site ([place]) 04/07/2019  . Chronic deep vein thrombosis (DVT) of left lower extremity ([place]) 01/05/2018  . Chronic kidney disease (CKD), stage III (moderate) 11/27/2016  . Old cerebrovascular accident (CVA) without late effect   . Blurred vision, bilateral 06/23/2016  . Primary cancer of left upper lobe of lung ([place]) 06/12/2016  . Mediastinal mass   . History of nephrolithiasis 03/27/2016  . Pharyngeal dysphagia 01/01/2016  . Benign neoplasm of ascending colon   . Benign neoplasm of sigmoid colon   . Benign neoplasm of transverse colon   . Diverticulosis of large intestine without diverticulitis   . Overweight (BMI 25.0-29.9) 04/16/2015  . Controlled diabetes mellitus with chronic kidney disease ([place]) 03/18/2015  . Dyslipidemia 03/18/2015  . Disorder of male genital organ 08/08/2012  . Nodular prostate with  urinary obstruction 08/08/2012  . Elevated prostate specific antigen (PSA) 08/08/2012    Past Surgical History:  Procedure Laterality Date  . COLONOSCOPY    . COLONOSCOPY WITH PROPOFOL N/A 05/06/2015   Procedure: COLONOSCOPY WITH PROPOFOL;  Surgeon: Lucilla Lame, MD;  Location: [place];  Service: Endoscopy;  Laterality: N/A;  ASCENDING COLON POLYPS X 2 TERMINAL ILEUM BIOPSY RANDOM COLON BX. TRANSVERSE COLON POLYP SIGMOID COLON POLYP  . ESOPHAGOGASTRODUODENOSCOPY (EGD) WITH  PROPOFOL N/A 05/06/2015   Procedure: ESOPHAGOGASTRODUODENOSCOPY (EGD) WITH PROPOFOL;  Surgeon: Lucilla Lame, MD;  Location: [place];  Service: Endoscopy;  Laterality: N/A;  GASTRIC BIOPSY X1  . KIDNEY STONE SURGERY  2017    Family History  Problem Relation Age of Onset  . Diabetes Mother   . Diabetes Father   . CAD Father   . Dementia Father   . Diabetes Sister   . Cancer Maternal Uncle        Prostate  . Cancer Cousin        prostate    Social History   Socioeconomic History  . Marital status: Married    Spouse name: Melissa   . Number of children: 3  . Years of education: Not on file  . Highest education level: Not on file  Occupational History  . Occupation: disable     Comment: from CVA and lung cancer  Social Needs  . Financial resource strain: Somewhat hard  . Food insecurity    Worry: Never true    Inability: Never true  . Transportation needs    Medical: No    Non-medical: No  Tobacco Use  . Smoking status: Never Smoker  . Smokeless tobacco: Never Used  Substance and Sexual Activity  . Alcohol use: No    Alcohol/week: 0.0 standard drinks  . Drug use: No  . Sexual activity: Yes    Partners: Female  Lifestyle  . Physical activity    Days per week: 3 days    Minutes per session: 30 min  . Stress: Not at all  Relationships  . Social connections    Talks on phone: More than three times a week    Gets together: Three times a week    Attends religious service: More than 4 times per year    Active member of club or organization: No    Attends meetings of clubs or organizations: Never    Relationship status: Married  . Intimate partner violence    Fear of current or ex partner: No    Emotionally abused: No    Physically abused: No    Forced sexual activity: No  Other Topics Concern  . Not on file  Social History Narrative   Used to work until 2 years ago when got sick with DVT leg , PE , CVA and found out he had lung cancer. He has medicare  now      Current Outpatient Medications:  .  atorvastatin (LIPITOR) 40 MG tablet, Take 1 tablet (40 mg total) by mouth 3 (three) times a week., Disp: 48 tablet, Rfl: 1 .  blood glucose meter kit and supplies, Dispense based on patient and insurance preference (Accuchek Aviva Plus, Accuchek Nano). Use up to four times daily as directed. (FOR ICD-10 E10.9, E11.9)., Disp: 1 each, Rfl: 0 .  insulin degludec (TRESIBA FLEXTOUCH) 100 UNIT/ML SOPN FlexTouch Pen, Inject 0.26 mLs (26 Units total) into the skin daily., Disp: 15 mL, Rfl: 0 .  metFORMIN (GLUCOPHAGE) 500 MG tablet, Take  1 tablet (500 mg total) by mouth 3 (three) times daily., Disp: 270 tablet, Rfl: 0 .  ondansetron (ZOFRAN) 8 MG tablet, One pill every 8 hours as needed for nausea/vomitting., Disp: 40 tablet, Rfl: 1 .  rivaroxaban (XARELTO) 20 MG TABS tablet, TAKE 1 TABLET BY MOUTH ONCE DAILY WITH  SUPPER, Disp: 30 tablet, Rfl: 6 .  tamsulosin (FLOMAX) 0.4 MG CAPS capsule, Take 1 capsule (0.4 mg total) by mouth every morning., Disp: 90 capsule, Rfl: 3 .  entrectinib (ROZLYTREK) 200 MG capsule, Take 3 capsules (600 mg total) by mouth daily. (Patient not taking: Reported on 08/07/2019), Disp: 90 capsule, Rfl: 6 .  fluticasone (FLONASE) 50 MCG/ACT nasal spray, Place 2 sprays into both nostrils daily., Disp: 16 g, Rfl: 2  No Known Allergies  I personally reviewed active problem list, medication list, allergies, family history, social history, health maintenance with the patient/caregiver today.   ROS  Ten systems reviewed and is negative except as mentioned in HPI   Objective  Virtual encounter, vitals not obtained.  Body mass index is 28.67 kg/m.  Physical Exam  Awake, alert and oriented   PHQ2/9: Depression screen [facility] 2/9 08/07/2019 04/07/2019 12/29/2018 08/22/2018 04/07/2018  Decreased Interest 0 0 0 0 0  Down, Depressed, Hopeless 0 0 0 0 0  PHQ - 2 Score 0 0 0 0 0  Altered sleeping 0 0 0 - 0  Tired, decreased energy 0 0 0 - 1   Change in appetite 0 0 0 - 0  Feeling bad or failure about yourself  0 0 0 - 0  Trouble concentrating 0 0 0 - 0  Moving slowly or fidgety/restless 0 0 0 - 0  Suicidal thoughts 0 0 0 - 0  PHQ-9 Score 0 0 0 - 1  Difficult doing work/chores - Not difficult at all Not difficult at all - Not difficult at all  Some recent data might be hidden   PHQ-2/9 Result is negative.    Fall Risk: Fall Risk  08/07/2019 04/07/2019 12/29/2018 08/22/2018 04/07/2018  Falls in the past year? 0 0 0 0 No  Number falls in past yr: 0 0 0 - -  Injury with Fall? 0 0 0 - -     Assessment & Plan  1. Eustachian tube disorder, right  - fluticasone (FLONASE) 50 MCG/ACT nasal spray; Place 2 sprays into both nostrils daily.  Dispense: 16 g; Refill: 2  2. Other specified hearing loss of both ears  Referral placed by oncologist for patient to be seen by ENT  3. DM type 2 with diabetic dyslipidemia ([place])  - metFORMIN (GLUCOPHAGE) 500 MG tablet; Take 1 tablet (500 mg total) by mouth 3 (three) times daily.  Dispense: 270 tablet; Refill: 0 He did not come in for A1C  4. Stage 3a chronic kidney disease   5. Old cerebrovascular accident (CVA) without late effect  stable  6. Primary malignant neoplasm of lung metastatic to other site, unspecified laterality ([place])  Under the care of Oncologist  7. Chronic deep vein thrombosis (DVT) of left lower extremity, unspecified vein ([facility])  On Xarelto   8. Dyslipidemia  On statin therapy   I discussed the assessment and treatment plan with the patient. The patient was provided an opportunity to ask questions and all were answered. The patient agreed with the plan and demonstrated an understanding of the instructions.   The patient was advised to call back or seek an in-person evaluation if the symptoms worsen or if the  condition fails to improve as anticipated.  I provided 25 minutes of non-face-to-face time during this encounter.  Loistine Chance, MD

## 2019-08-09 ENCOUNTER — Inpatient Hospital Stay: Payer: Medicare Other | Attending: Internal Medicine

## 2019-08-09 ENCOUNTER — Other Ambulatory Visit: Payer: Self-pay | Admitting: Internal Medicine

## 2019-08-09 ENCOUNTER — Other Ambulatory Visit: Payer: Self-pay

## 2019-08-09 ENCOUNTER — Inpatient Hospital Stay: Payer: Medicare Other

## 2019-08-09 ENCOUNTER — Inpatient Hospital Stay (HOSPITAL_BASED_OUTPATIENT_CLINIC_OR_DEPARTMENT_OTHER): Payer: Medicare Other | Admitting: Oncology

## 2019-08-09 VITALS — BP 134/77 | HR 71 | Temp 96.5°F | Resp 18

## 2019-08-09 DIAGNOSIS — C3412 Malignant neoplasm of upper lobe, left bronchus or lung: Secondary | ICD-10-CM | POA: Insufficient documentation

## 2019-08-09 DIAGNOSIS — Z794 Long term (current) use of insulin: Secondary | ICD-10-CM | POA: Diagnosis not present

## 2019-08-09 DIAGNOSIS — Z8673 Personal history of transient ischemic attack (TIA), and cerebral infarction without residual deficits: Secondary | ICD-10-CM | POA: Diagnosis not present

## 2019-08-09 DIAGNOSIS — Z9079 Acquired absence of other genital organ(s): Secondary | ICD-10-CM | POA: Diagnosis not present

## 2019-08-09 DIAGNOSIS — N183 Chronic kidney disease, stage 3 unspecified: Secondary | ICD-10-CM | POA: Insufficient documentation

## 2019-08-09 DIAGNOSIS — E1122 Type 2 diabetes mellitus with diabetic chronic kidney disease: Secondary | ICD-10-CM | POA: Diagnosis not present

## 2019-08-09 DIAGNOSIS — Z86718 Personal history of other venous thrombosis and embolism: Secondary | ICD-10-CM | POA: Diagnosis not present

## 2019-08-09 DIAGNOSIS — N400 Enlarged prostate without lower urinary tract symptoms: Secondary | ICD-10-CM | POA: Diagnosis not present

## 2019-08-09 DIAGNOSIS — Z5112 Encounter for antineoplastic immunotherapy: Secondary | ICD-10-CM | POA: Diagnosis not present

## 2019-08-09 DIAGNOSIS — H538 Other visual disturbances: Secondary | ICD-10-CM | POA: Diagnosis not present

## 2019-08-09 DIAGNOSIS — R531 Weakness: Secondary | ICD-10-CM | POA: Diagnosis not present

## 2019-08-09 DIAGNOSIS — C778 Secondary and unspecified malignant neoplasm of lymph nodes of multiple regions: Secondary | ICD-10-CM | POA: Diagnosis not present

## 2019-08-09 DIAGNOSIS — Z7901 Long term (current) use of anticoagulants: Secondary | ICD-10-CM | POA: Diagnosis not present

## 2019-08-09 DIAGNOSIS — Z86711 Personal history of pulmonary embolism: Secondary | ICD-10-CM | POA: Diagnosis not present

## 2019-08-09 DIAGNOSIS — K5730 Diverticulosis of large intestine without perforation or abscess without bleeding: Secondary | ICD-10-CM | POA: Diagnosis not present

## 2019-08-09 DIAGNOSIS — E785 Hyperlipidemia, unspecified: Secondary | ICD-10-CM | POA: Insufficient documentation

## 2019-08-09 DIAGNOSIS — C7931 Secondary malignant neoplasm of brain: Secondary | ICD-10-CM | POA: Insufficient documentation

## 2019-08-09 DIAGNOSIS — R634 Abnormal weight loss: Secondary | ICD-10-CM | POA: Diagnosis not present

## 2019-08-09 DIAGNOSIS — R42 Dizziness and giddiness: Secondary | ICD-10-CM | POA: Diagnosis not present

## 2019-08-09 DIAGNOSIS — Z79899 Other long term (current) drug therapy: Secondary | ICD-10-CM | POA: Insufficient documentation

## 2019-08-09 DIAGNOSIS — I700 Atherosclerosis of aorta: Secondary | ICD-10-CM | POA: Diagnosis not present

## 2019-08-09 DIAGNOSIS — R0602 Shortness of breath: Secondary | ICD-10-CM

## 2019-08-09 DIAGNOSIS — Z8719 Personal history of other diseases of the digestive system: Secondary | ICD-10-CM | POA: Diagnosis not present

## 2019-08-09 DIAGNOSIS — Z86010 Personal history of colonic polyps: Secondary | ICD-10-CM | POA: Insufficient documentation

## 2019-08-09 LAB — COMPREHENSIVE METABOLIC PANEL
ALT: 25 U/L (ref 0–44)
AST: 21 U/L (ref 15–41)
Albumin: 4.3 g/dL (ref 3.5–5.0)
Alkaline Phosphatase: 49 U/L (ref 38–126)
Anion gap: 12 (ref 5–15)
BUN: 17 mg/dL (ref 8–23)
CO2: 20 mmol/L — ABNORMAL LOW (ref 22–32)
Calcium: 9.5 mg/dL (ref 8.9–10.3)
Chloride: 105 mmol/L (ref 98–111)
Creatinine, Ser: 1.18 mg/dL (ref 0.61–1.24)
GFR calc Af Amer: >60 mL/min (ref >60)
GFR calc non Af Amer: >60 mL/min (ref >60)
Glucose, Bld: 216 mg/dL — ABNORMAL HIGH (ref 70–99)
Potassium: 3.9 mmol/L (ref 3.5–5.1)
Sodium: 137 mmol/L (ref 135–145)
Total Bilirubin: 0.7 mg/dL (ref 0.3–1.2)
Total Protein: 7.4 g/dL (ref 6.5–8.1)

## 2019-08-09 LAB — CBC WITH DIFFERENTIAL/PLATELET
Abs Immature Granulocytes: 0.01 10*3/uL (ref 0.00–0.07)
Basophils Absolute: 0 10*3/uL (ref 0.0–0.1)
Basophils Relative: 0 %
Eosinophils Absolute: 0.1 10*3/uL (ref 0.0–0.5)
Eosinophils Relative: 3 %
HCT: 39.1 % (ref 39.0–52.0)
Hemoglobin: 13.1 g/dL (ref 13.0–17.0)
Immature Granulocytes: 0 %
Lymphocytes Relative: 38 %
Lymphs Abs: 1.7 10*3/uL (ref 0.7–4.0)
MCH: 29.8 pg (ref 26.0–34.0)
MCHC: 33.5 g/dL (ref 30.0–36.0)
MCV: 88.9 fL (ref 80.0–100.0)
Monocytes Absolute: 0.7 10*3/uL (ref 0.1–1.0)
Monocytes Relative: 16 %
Neutro Abs: 1.9 10*3/uL (ref 1.7–7.7)
Neutrophils Relative %: 43 %
Platelets: 231 10*3/uL (ref 150–400)
RBC: 4.4 MIL/uL (ref 4.22–5.81)
RDW: 13.6 % (ref 11.5–15.5)
WBC: 4.5 10*3/uL (ref 4.0–10.5)
nRBC: 0 % (ref 0.0–0.2)

## 2019-08-09 LAB — URINALYSIS, COMPLETE (UACMP) WITH MICROSCOPIC
Bacteria, UA: NONE SEEN
Bilirubin Urine: NEGATIVE
Glucose, UA: 50 mg/dL — AB
Hgb urine dipstick: NEGATIVE
Ketones, ur: NEGATIVE mg/dL
Leukocytes,Ua: NEGATIVE
Nitrite: NEGATIVE
Protein, ur: NEGATIVE mg/dL
Specific Gravity, Urine: 1.017 (ref 1.005–1.030)
Squamous Epithelial / HPF: NONE SEEN (ref 0–5)
pH: 5 (ref 5.0–8.0)

## 2019-08-09 MED ORDER — SODIUM CHLORIDE 0.9 % IV SOLN
800.0000 mg | Freq: Once | INTRAVENOUS | Status: AC
  Administered 2019-08-09: 800 mg via INTRAVENOUS
  Filled 2019-08-09: qty 32

## 2019-08-09 MED ORDER — SODIUM CHLORIDE 0.9 % IV SOLN
Freq: Once | INTRAVENOUS | Status: AC
  Administered 2019-08-09: 14:00:00 via INTRAVENOUS
  Filled 2019-08-09: qty 250

## 2019-08-09 NOTE — Progress Notes (Signed)
Pt reports SOB and is unable to say if it is baseline. Pt reports confusion that is not new and that MD is aware. MD aware and per Dr. Vertell Novak NP to assess pt. VSS.  Faythe Casa at chairside to assess pt, no s/s of distress noted. Pt denies any concerns. Per Faythe Casa proceed with scheduled treatment. NNO at this time 8527: Pt tolerated infusion well. Pt and VS stable at discharge.

## 2019-08-10 ENCOUNTER — Other Ambulatory Visit: Payer: Medicare Other

## 2019-08-10 NOTE — Progress Notes (Signed)
Tumor Board Documentation  DEONTAY LADNIER was presented by [name] at our Tumor Board on 08/10/2019, which included representatives from medical oncology, radiation oncology, navigation, pathology, radiology, surgical, internal medicine, genetics, pulmonology, palliative care, research.  Drequan currently presents as a current patient, for discussion with history of the following treatments: active survellience, adjuvant chemotherapy, immunotherapy.  Additionally, we reviewed previous medical and familial history, history of present illness, and recent lab results along with all available histopathologic and imaging studies. The tumor board considered available treatment options and made the following recommendations: Active surveillance Repeat MRI in a couple of months  The following procedures/referrals were also placed: No orders of the defined types were placed in this encounter.   Clinical Trial Status: not discussed   Staging used:    National site-specific guidelines   were discussed with respect to the case.  Tumor board is a meeting of clinicians from various specialty areas who evaluate and discuss patients for whom a multidisciplinary approach is being considered. Final determinations in the plan of care are those of the provider(s). The responsibility for follow up of recommendations given during tumor board is that of the provider.   Today's extended care, comprehensive team conference, Jacquis was not present for the discussion and was not examined.   Multidisciplinary Tumor Board is a multidisciplinary case peer review process.  Decisions discussed in the Multidisciplinary Tumor Board reflect the opinions of the specialists present at the conference without having examined the patient.  Ultimately, treatment and diagnostic decisions rest with the primary provider(s) and the patient.

## 2019-08-10 NOTE — Progress Notes (Signed)
Re:: Shortness of breath/stage IV lung cancer  Patient evaluated while in infusion prior to MVASI treatment.  Infusion RN, Meribeth Mattes expressed concern regarding patient's answers to questions and possible confusion.  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  He is status post whole brain radiation which was completed in August 2020.  He was unable to report if his current shortness of breath was chronic or acute.  Dr. Rogue Bussing, patient's oncologist requested NP to assess.  On assessment, patient did not appear short of breath.  Oxygen saturations were 100%.  There was no increased work of breathing.  Lung sounds were normal.  Instructed RN and patient to let me know if symptoms worsened.   PMH reviewed:  Past Medical History:  Diagnosis Date  . Abnormal prostate specific antigen 08/08/2012  . Adiposity 04/16/2015  . Chronic kidney disease (CKD), stage III (moderate) 11/27/2016  . CVA (cerebral vascular accident) ([place]) 06/17/2016  . Diabetes mellitus without complication ([place])   . Diverticulosis of sigmoid colon 04/16/2015  . Dyslipidemia 03/18/2015  . Hemorrhoids, internal 04/16/2015  . Hypercholesteremia 04/16/2015  . Hyperlipidemia   . Hypertension   . Primary cancer of left upper lobe of lung ([place])   . Pulmonary embolism ([place])   . Wears dentures    partial upper   Faythe Casa, NP 08/10/2019 10:55 AM

## 2019-08-17 ENCOUNTER — Telehealth: Payer: Self-pay | Admitting: *Deleted

## 2019-08-17 ENCOUNTER — Telehealth: Payer: Self-pay | Admitting: Internal Medicine

## 2019-08-17 NOTE — Telephone Encounter (Signed)
Melvin Willis- I had made a referral to ENT at last visit. Please inform pt that we will check on the status.  C/T- please check if pt could be seen with ENT any time soon.   Thanks GB

## 2019-08-17 NOTE — Telephone Encounter (Signed)
Patient called reporting that he is having hearing loss, he is not having any pain, but having trouble hearing. Please advise

## 2019-08-17 NOTE — Telephone Encounter (Signed)
Patient states he has an appointment with ENT next week in [facility]

## 2019-08-21 ENCOUNTER — Other Ambulatory Visit: Payer: Self-pay

## 2019-08-21 ENCOUNTER — Encounter: Payer: Self-pay | Admitting: Internal Medicine

## 2019-08-21 NOTE — Progress Notes (Signed)
Patient is here for a follow-up, no concerns or complaints at this time.

## 2019-08-22 ENCOUNTER — Other Ambulatory Visit: Payer: Self-pay

## 2019-08-22 ENCOUNTER — Inpatient Hospital Stay: Payer: Medicare Other

## 2019-08-22 ENCOUNTER — Inpatient Hospital Stay (HOSPITAL_BASED_OUTPATIENT_CLINIC_OR_DEPARTMENT_OTHER): Payer: Medicare Other | Admitting: Hospice and Palliative Medicine

## 2019-08-22 ENCOUNTER — Inpatient Hospital Stay (HOSPITAL_BASED_OUTPATIENT_CLINIC_OR_DEPARTMENT_OTHER): Payer: Medicare Other | Admitting: Internal Medicine

## 2019-08-22 DIAGNOSIS — C3412 Malignant neoplasm of upper lobe, left bronchus or lung: Secondary | ICD-10-CM

## 2019-08-22 DIAGNOSIS — Z5112 Encounter for antineoplastic immunotherapy: Secondary | ICD-10-CM | POA: Diagnosis not present

## 2019-08-22 DIAGNOSIS — Z515 Encounter for palliative care: Secondary | ICD-10-CM | POA: Diagnosis not present

## 2019-08-22 DIAGNOSIS — Z7189 Other specified counseling: Secondary | ICD-10-CM

## 2019-08-22 LAB — CBC WITH DIFFERENTIAL/PLATELET
Abs Immature Granulocytes: 0 10*3/uL (ref 0.00–0.07)
Basophils Absolute: 0 10*3/uL (ref 0.0–0.1)
Basophils Relative: 1 %
Eosinophils Absolute: 0 10*3/uL (ref 0.0–0.5)
Eosinophils Relative: 1 %
HCT: 42.9 % (ref 39.0–52.0)
Hemoglobin: 14.7 g/dL (ref 13.0–17.0)
Immature Granulocytes: 0 %
Lymphocytes Relative: 45 %
Lymphs Abs: 1.9 10*3/uL (ref 0.7–4.0)
MCH: 30.3 pg (ref 26.0–34.0)
MCHC: 34.3 g/dL (ref 30.0–36.0)
MCV: 88.5 fL (ref 80.0–100.0)
Monocytes Absolute: 1 10*3/uL (ref 0.1–1.0)
Monocytes Relative: 23 %
Neutro Abs: 1.2 10*3/uL — ABNORMAL LOW (ref 1.7–7.7)
Neutrophils Relative %: 30 %
Platelets: 166 10*3/uL (ref 150–400)
RBC: 4.85 MIL/uL (ref 4.22–5.81)
RDW: 14 % (ref 11.5–15.5)
WBC: 4.1 10*3/uL (ref 4.0–10.5)
nRBC: 0 % (ref 0.0–0.2)

## 2019-08-22 LAB — URINALYSIS, COMPLETE (UACMP) WITH MICROSCOPIC
Bacteria, UA: NONE SEEN
Bilirubin Urine: NEGATIVE
Glucose, UA: NEGATIVE mg/dL
Hgb urine dipstick: NEGATIVE
Ketones, ur: NEGATIVE mg/dL
Leukocytes,Ua: NEGATIVE
Nitrite: NEGATIVE
Protein, ur: NEGATIVE mg/dL
Specific Gravity, Urine: 1.011 (ref 1.005–1.030)
pH: 5 (ref 5.0–8.0)

## 2019-08-22 LAB — COMPREHENSIVE METABOLIC PANEL
ALT: 31 U/L (ref 0–44)
AST: 25 U/L (ref 15–41)
Albumin: 4.2 g/dL (ref 3.5–5.0)
Alkaline Phosphatase: 63 U/L (ref 38–126)
Anion gap: 13 (ref 5–15)
BUN: 13 mg/dL (ref 8–23)
CO2: 18 mmol/L — ABNORMAL LOW (ref 22–32)
Calcium: 9.3 mg/dL (ref 8.9–10.3)
Chloride: 105 mmol/L (ref 98–111)
Creatinine, Ser: 1.19 mg/dL (ref 0.61–1.24)
GFR calc Af Amer: >60 mL/min (ref >60)
GFR calc non Af Amer: >60 mL/min (ref >60)
Glucose, Bld: 156 mg/dL — ABNORMAL HIGH (ref 70–99)
Potassium: 3.6 mmol/L (ref 3.5–5.1)
Sodium: 136 mmol/L (ref 135–145)
Total Bilirubin: 0.7 mg/dL (ref 0.3–1.2)
Total Protein: 7.4 g/dL (ref 6.5–8.1)

## 2019-08-22 MED ORDER — SODIUM CHLORIDE 0.9 % IV SOLN
800.0000 mg | Freq: Once | INTRAVENOUS | Status: AC
  Administered 2019-08-22: 10:00:00 800 mg via INTRAVENOUS
  Filled 2019-08-22: qty 32

## 2019-08-22 MED ORDER — SODIUM CHLORIDE 0.9 % IV SOLN
Freq: Once | INTRAVENOUS | Status: AC
  Filled 2019-08-22: qty 250

## 2019-08-22 NOTE — Progress Notes (Signed)
[facility] OFFICE PROGRESS NOTE  Patient Care Team: [name], [name], MD as Medical Oncologist (Medical Oncology) Cathi Roan, [place]ar [place] (Pharmacist)  Cancer Staging No matching staging information was found for the patient.   Oncology History Overview Note  # OCT 2017- ADENO CA LUNG; STAGE IV [; LUL; bil supraclavicular LN; Left neck LN Bx]; ROS-1 MUTATED; s/p Carbo-alimta x1[oct 2017]  # NOV 1st 2017- XALKORI 250 mg BID; JAN 15th CT- PR;  # AUG 27th Chemo-RT to persistent LUL/mediastinal LN [s/p carbo-taxol- with RT; finished Sep 22nd 2018];   # July 14th 2020- multiple metastatic lesions of the brain [July 30 whole brain radiation finished April 21, 2019]; bone scan skull metastases /posterior left ninth rib metastases; April 04, 2019 stopped crizotinib;  #April 25, 2019-start Entrectinib 600 mg once a day; STopped in NOV 2020 [dizziness]; NOV 2020-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 On xarelto]-Lovenox; Jan mid 2018- xarelto [lovenox-insurance issues]  # MRI brain- multiple infarcts [2d echo/bubble study-NEG's/p Neurology eval]  ------------------------------------------------------------    # # s/p TURP [Sep 2017; Dr.Cope] DEC 26th CT- distended bladder   # MOLECULAR TESTING- ROS-1 POSITIVE; ALK/EGFR-NEG; PDL-1 EXPRESSION- 90%** [HIGH]  # DEC 15th 2020- PALLIATIVE CARE -----------------------------------------------------------------    DIAGNOSIS: Adenocarcinoma the lung  ROS-1 +  STAGE:  IV    ;GOALS: Palliative  CURRENT/MOST RECENT THERAPY- Avastin    Primary cancer of left upper lobe of lung ([place])  08/09/2019 -  Chemotherapy   The patient had bevacizumab-awwb (MVASI) 800 mg in sodium chloride 0.9 % 100 mL chemo infusion, 725 mg, Intravenous,  Once, 1 of 6 cycles Administration: 800 mg (08/09/2019)  for  chemotherapy treatment.        INTERVAL HISTORY:  Melvin Willis 68 y.o.  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  Patient received cycle #1 approximately 2 weeks ago.  He complains of mild joint pains; mild to moderate fatigue.  There is no headaches.  No nausea vomiting.  He continues to have difficulty hearing.  Awaiting appointment with ENT tomorrow.  Dizziness is improved.  Otherwise no headaches.  Review of Systems  Constitutional: Positive for malaise/fatigue. Negative for chills, diaphoresis, fever and weight loss.  HENT: Positive for hearing loss. Negative for nosebleeds and sore throat.   Eyes: Negative for double vision.  Respiratory: Negative for cough, hemoptysis, sputum production, shortness of breath and wheezing.   Cardiovascular: Negative for chest pain (Left upper chest wall pain chronic), palpitations and orthopnea.  Gastrointestinal: Negative for abdominal pain, blood in stool, constipation, diarrhea, heartburn and melena.  Genitourinary: Negative for dysuria.  Musculoskeletal: Positive for joint pain. Negative for back pain.  Skin: Negative.  Negative for itching and rash.  Neurological: Positive for dizziness. Negative for tingling, focal weakness and weakness.  Endo/Heme/Allergies: Does not bruise/bleed easily.  Psychiatric/Behavioral: Positive for memory loss. Negative for depression. The patient is not nervous/anxious and does not have insomnia.     PAST MEDICAL HISTORY :  Past Medical History:  Diagnosis Date  . Abnormal prostate specific antigen 08/08/2012  . Adiposity 04/16/2015  . Chronic kidney disease (CKD), stage III (moderate) 11/27/2016  . CVA (cerebral vascular accident) ([place]) 06/17/2016  . Diabetes mellitus without complication ([place])   . Diverticulosis of sigmoid colon 04/16/2015  . Dyslipidemia  03/18/2015  . Hemorrhoids, internal 04/16/2015  .  Hypercholesteremia 04/16/2015  . Hyperlipidemia   . Hypertension   . Primary cancer of left upper lobe of lung ([place])   . Pulmonary embolism ([place])   . Wears dentures    partial upper    PAST SURGICAL HISTORY :   Past Surgical History:  Procedure Laterality Date  . COLONOSCOPY    . COLONOSCOPY WITH PROPOFOL N/A 05/06/2015   Procedure: COLONOSCOPY WITH PROPOFOL;  Surgeon: Lucilla Lame, MD;  Location: [facility];  Service: Endoscopy;  Laterality: N/A;  ASCENDING COLON POLYPS X 2 TERMINAL ILEUM BIOPSY RANDOM COLON BX. TRANSVERSE COLON POLYP SIGMOID COLON POLYP  . ESOPHAGOGASTRODUODENOSCOPY (EGD) WITH PROPOFOL N/A 05/06/2015   Procedure: ESOPHAGOGASTRODUODENOSCOPY (EGD) WITH PROPOFOL;  Surgeon: Lucilla Lame, MD;  Location: [facility];  Service: Endoscopy;  Laterality: N/A;  GASTRIC BIOPSY X1  . KIDNEY STONE SURGERY  2017    FAMILY HISTORY :   Family History  Problem Relation Age of Onset  . Diabetes Mother   . Diabetes Father   . CAD Father   . Dementia Father   . Diabetes Sister   . Cancer Maternal Uncle        Prostate  . Cancer Cousin        prostate    SOCIAL HISTORY:   Social History   Tobacco Use  . Smoking status: Never Smoker  . Smokeless tobacco: Never Used  Substance Use Topics  . Alcohol use: No    Alcohol/week: 0.0 standard drinks  . Drug use: No    ALLERGIES:  has No Known Allergies.  MEDICATIONS:  Current Outpatient Medications  Medication Sig Dispense Refill  . atorvastatin (LIPITOR) 40 MG tablet Take 1 tablet (40 mg total) by mouth 3 (three) times a week. 48 tablet 1  . blood glucose meter kit and supplies Dispense based on patient and insurance preference (Accuchek Aviva Plus, Accuchek Nano). Use up to four times daily as directed. (FOR ICD-10 E10.9, E11.9). 1 each 0  . fluticasone (FLONASE) 50 MCG/ACT nasal spray Place 2 sprays into both nostrils daily. 16 g 2  . insulin degludec (TRESIBA FLEXTOUCH) 100 UNIT/ML SOPN FlexTouch Pen  Inject 0.26 mLs (26 Units total) into the skin daily. 15 mL 0  . metFORMIN (GLUCOPHAGE) 500 MG tablet Take 1 tablet (500 mg total) by mouth 3 (three) times daily. 270 tablet 0  . ondansetron (ZOFRAN) 8 MG tablet One pill every 8 hours as needed for nausea/vomitting. 40 tablet 1  . rivaroxaban (XARELTO) 20 MG TABS tablet TAKE 1 TABLET BY MOUTH ONCE DAILY WITH  SUPPER 30 tablet 6  . tamsulosin (FLOMAX) 0.4 MG CAPS capsule Take 1 capsule (0.4 mg total) by mouth every morning. 90 capsule 3  . entrectinib (ROZLYTREK) 200 MG capsule Take 3 capsules (600 mg total) by mouth daily. (Patient not taking: Reported on 08/07/2019) 90 capsule 6   No current facility-administered medications for this visit.    PHYSICAL EXAMINATION: ECOG PERFORMANCE STATUS: 0 - Asymptomatic  BP 119/85 (BP Location: Left Arm, Patient Position: Sitting)   Pulse 98   Temp (!) 97.3 F (36.3 C) (Tympanic)   Wt 154 lb (69.9 kg)   BMI 26.43 kg/m   Filed Weights   08/22/19 0858  Weight: 154 lb (69.9 kg)    Physical Exam  Constitutional: He is oriented to person, place, and time and well-developed, well-nourished, and in no distress.  Is alone.  He is walking by himself.  HENT:  Head: Normocephalic and atraumatic.  Mouth/Throat: Oropharynx is clear and moist. No oropharyngeal exudate.  Eyes: Pupils are equal, round, and reactive to light.  Cardiovascular: Normal rate and regular rhythm.  Pulmonary/Chest: No respiratory distress. He has no wheezes.  Abdominal: Soft. Bowel sounds are normal. He exhibits no distension and no mass. There is no abdominal tenderness. There is no rebound and no guarding.  Musculoskeletal:        General: No tenderness or edema. Normal range of motion.     Cervical back: Normal range of motion and neck supple.  Neurological: He is alert and oriented to person, place, and time.  Skin: Skin is warm.  Psychiatric: Affect normal.   LABORATORY DATA:  I have reviewed the data as listed     Component Value Date/Time   NA 136 08/22/2019 0836   NA 139 01/01/2016 1000   K 3.6 08/22/2019 0836   CL 105 08/22/2019 0836   CO2 18 (L) 08/22/2019 0836   GLUCOSE 156 (H) 08/22/2019 0836   BUN 13 08/22/2019 0836   BUN 13 01/01/2016 1000   CREATININE 1.19 08/22/2019 0836   CREATININE 1.32 (H) 08/22/2018 1107   CALCIUM 9.3 08/22/2019 0836   PROT 7.4 08/22/2019 0836   PROT 7.2 01/01/2016 1000   ALBUMIN 4.2 08/22/2019 0836   ALBUMIN 4.4 01/01/2016 1000   AST 25 08/22/2019 0836   ALT 31 08/22/2019 0836   ALKPHOS 63 08/22/2019 0836   BILITOT 0.7 08/22/2019 0836   BILITOT 0.4 01/01/2016 1000   GFRNONAA >60 08/22/2019 0836   GFRNONAA 56 (L) 08/22/2018 1107   GFRAA >60 08/22/2019 0836   GFRAA 65 08/22/2018 1107    No results found for: SPEP, UPEP  Lab Results  Component Value Date   WBC 4.1 08/22/2019   NEUTROABS 1.2 (L) 08/22/2019   HGB 14.7 08/22/2019   HCT 42.9 08/22/2019   MCV 88.5 08/22/2019   PLT 166 08/22/2019      Chemistry      Component Value Date/Time   NA 136 08/22/2019 0836   NA 139 01/01/2016 1000   K 3.6 08/22/2019 0836   CL 105 08/22/2019 0836   CO2 18 (L) 08/22/2019 0836   BUN 13 08/22/2019 0836   BUN 13 01/01/2016 1000   CREATININE 1.19 08/22/2019 0836   CREATININE 1.32 (H) 08/22/2018 1107      Component Value Date/Time   CALCIUM 9.3 08/22/2019 0836   ALKPHOS 63 08/22/2019 0836   AST 25 08/22/2019 0836   ALT 31 08/22/2019 0836   BILITOT 0.7 08/22/2019 0836   BILITOT 0.4 01/01/2016 1000       RADIOGRAPHIC STUDIES: I have personally reviewed the radiological images as listed and agreed with the findings in the report. No results found.   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  MRI brain-overall improved except  increasing left temporal lesion [see discussion below] patient currently OFF Rozyltrek 2pills sec to dizzyness  #MRI-November 2020-s/p whole brain radiation [April 21, 2019]-shows overall improvement; except for worsening ~2.5 cm left temporal lesion-question true progression versus pseudoprogression/radiation necrosis. Currently on avastin every 2 weeks. Improved clinically.   # Proceed with avastin cycle #2 today. Labs today reviewed;  acceptable for treatment today.   # Difficulty hearing-no cerumen impaction noted.await ENT evaluation on 12/16.   #  Dizziness-unclear etiology-question brain metastases versus entrectinib [stop today]- STABLE on avastin.   # CKD-stage III multifactorial [prostatitic obstruction/diabetes]  Stable.   # Bilateral PE & left lower extremity DVT:  on Xarelto; stable.   # DISPOSITION: # avastin today # follow up in 2 weeks-MD;labs- cbc/cmp/UA avastin- Dr.B    Orders Placed This Encounter  Procedures  . CBC with Differential    Standing Status:   Future    Number of Occurrences:   1    Standing Expiration Date:   08/21/2020  . Comprehensive metabolic panel    Standing Status:   Future    Number of Occurrences:   1    Standing Expiration Date:   08/21/2020  . Urinalysis, Complete w Microscopic  . CBC with Differential    Standing Status:   Future    Standing Expiration Date:   08/21/2020  . Comprehensive metabolic panel    Standing Status:   Future    Standing Expiration Date:   08/21/2020  . Urinalysis, Complete w Microscopic    Standing Status:   Future    Standing Expiration Date:   08/21/2020   All questions were answered. The patient knows to call the clinic with any problems, questions or concerns.      Cammie Sickle, MD 08/22/2019 9:40 AM

## 2019-08-22 NOTE — Progress Notes (Signed)
[place]  [phone] [phone]   Name: Melvin Willis Date: 08/22/2019 MRN: [id number]  DOB: 1951/02/16  Patient Care Team: [name], [name], MD as Medical Oncologist (Medical Oncology) Cathi Roan, [facility] (Pharmacist)    REASON FOR CONSULTATION: Melvin Willis is a 68 y.o. 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  Patient is status post whole brain radiation and is currently on treatment with Avastin.  MRI of the brain on 07/25/2019 revealed progression of enhancing lesion in the left temporal lobe concerning for either radiation necrosis or progressive metastatic disease.  Patient was referred to palliative care to help address goals and manage ongoing symptoms.   SOCIAL HISTORY:     reports that he has never smoked. He has never used smokeless tobacco. He reports that he does not drink alcohol or use drugs.   Patient is married and lives at home with his wife.  He has 2 sons and a daughter who are involved in his care.  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  ADVANCE DIRECTIVES:  Does not have  CODE STATUS:   PAST MEDICAL HISTORY: Past Medical History:  Diagnosis Date  . Abnormal prostate specific antigen 08/08/2012  . Adiposity 04/16/2015  . Chronic kidney disease (CKD), stage III (moderate) 11/27/2016  . CVA (cerebral vascular accident) ([place]) 06/17/2016  . Diabetes mellitus without complication ([place])   . Diverticulosis of sigmoid colon 04/16/2015  . Dyslipidemia 03/18/2015  . Hemorrhoids, internal 04/16/2015  . Hypercholesteremia 04/16/2015  . Hyperlipidemia   . Hypertension   . Primary cancer of left upper lobe of lung ([place])   . Pulmonary embolism ([place])   . Wears dentures    partial upper    PAST SURGICAL HISTORY:  Past Surgical History:  Procedure  Laterality Date  . COLONOSCOPY    . COLONOSCOPY WITH PROPOFOL N/A 05/06/2015   Procedure: COLONOSCOPY WITH PROPOFOL;  Surgeon: Lucilla Lame, MD;  Location: [place];  Service: Endoscopy;  Laterality: N/A;  ASCENDING COLON POLYPS X 2 TERMINAL ILEUM BIOPSY RANDOM COLON BX. TRANSVERSE COLON POLYP SIGMOID COLON POLYP  . ESOPHAGOGASTRODUODENOSCOPY (EGD) WITH PROPOFOL N/A 05/06/2015   Procedure: ESOPHAGOGASTRODUODENOSCOPY (EGD) WITH PROPOFOL;  Surgeon: Lucilla Lame, MD;  Location: [place];  Service: Endoscopy;  Laterality: N/A;  GASTRIC BIOPSY X1  . KIDNEY STONE SURGERY  2017    HEMATOLOGY/ONCOLOGY HISTORY:  Oncology History Overview Note  # OCT 2017- ADENO CA LUNG; STAGE IV [; LUL; bil supraclavicular LN; Left neck LN Bx]; ROS-1 MUTATED; s/p Carbo-alimta x1[oct 2017]  # NOV 1st 2017- XALKORI 250 mg BID; JAN 15th CT- PR;  # AUG 27th Chemo-RT to persistent LUL/mediastinal LN [s/p carbo-taxol- with RT; finished Sep 22nd 2018];   # July 14th 2020- multiple metastatic lesions of the brain [July 30 whole brain radiation finished April 21, 2019]; bone scan skull metastases /posterior left ninth rib metastases; April 04, 2019 stopped crizotinib;  #April 25, 2019-start Entrectinib 600 mg once a day; STopped in NOV 2020 [dizziness]; NOV 2020-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 On xarelto]-Lovenox; Jan mid 2018- xarelto [lovenox-insurance issues]  # MRI brain- multiple infarcts [2d echo/bubble study-NEG's/p Neurology eval]  ------------------------------------------------------------    # # s/p TURP [Sep 2017; Dr.Cope] DEC 26th CT- distended  bladder   # MOLECULAR TESTING- ROS-1 POSITIVE; ALK/EGFR-NEG; PDL-1 EXPRESSION- 90%** [HIGH]  # DEC 15th 2020- PALLIATIVE CARE -----------------------------------------------------------------    DIAGNOSIS: Adenocarcinoma the lung  ROS-1 +  STAGE:   IV    ;GOALS: Palliative  CURRENT/MOST RECENT THERAPY- Avastin    Primary cancer of left upper lobe of lung ([place])  08/09/2019 -  Chemotherapy   The patient had bevacizumab-awwb (MVASI) 800 mg in sodium chloride 0.9 % 100 mL chemo infusion, 725 mg, Intravenous,  Once, 2 of 6 cycles Administration: 800 mg (08/09/2019)  for chemotherapy treatment.      ALLERGIES:  has No Known Allergies.  MEDICATIONS:  Current Outpatient Medications  Medication Sig Dispense Refill  . atorvastatin (LIPITOR) 40 MG tablet Take 1 tablet (40 mg total) by mouth 3 (three) times a week. 48 tablet 1  . blood glucose meter kit and supplies Dispense based on patient and insurance preference (Accuchek Aviva Plus, Accuchek Nano). Use up to four times daily as directed. (FOR ICD-10 E10.9, E11.9). 1 each 0  . entrectinib (ROZLYTREK) 200 MG capsule Take 3 capsules (600 mg total) by mouth daily. (Patient not taking: Reported on 08/07/2019) 90 capsule 6  . fluticasone (FLONASE) 50 MCG/ACT nasal spray Place 2 sprays into both nostrils daily. 16 g 2  . insulin degludec (TRESIBA FLEXTOUCH) 100 UNIT/ML SOPN FlexTouch Pen Inject 0.26 mLs (26 Units total) into the skin daily. 15 mL 0  . metFORMIN (GLUCOPHAGE) 500 MG tablet Take 1 tablet (500 mg total) by mouth 3 (three) times daily. 270 tablet 0  . ondansetron (ZOFRAN) 8 MG tablet One pill every 8 hours as needed for nausea/vomitting. 40 tablet 1  . rivaroxaban (XARELTO) 20 MG TABS tablet TAKE 1 TABLET BY MOUTH ONCE DAILY WITH  SUPPER 30 tablet 6  . tamsulosin (FLOMAX) 0.4 MG CAPS capsule Take 1 capsule (0.4 mg total) by mouth every morning. 90 capsule 3   No current facility-administered medications for this visit.   Facility-Administered Medications Ordered in Other Visits  Medication Dose Route Frequency Provider Last Rate Last Admin  . bevacizumab-awwb (MVASI) 800 mg in sodium chloride 0.9 % 100 mL chemo infusion  800 mg Intravenous Once Cammie Sickle, MD         VITAL SIGNS: There were no vitals taken for this visit. There were no vitals filed for this visit.  Estimated body mass index is 26.43 kg/m as calculated from the following:   Height as of 07/04/19: '5\' 4"'  (1.626 m).   Weight as of an earlier encounter on 08/22/19: 154 lb (69.9 kg).  LABS: CBC:    Component Value Date/Time   WBC 4.1 08/22/2019 0836   HGB 14.7 08/22/2019 0836   HGB 14.1 01/01/2016 1000   HCT 42.9 08/22/2019 0836   HCT 42.7 01/01/2016 1000   PLT 166 08/22/2019 0836   PLT 307 01/01/2016 1000   MCV 88.5 08/22/2019 0836   MCV 86 01/01/2016 1000   NEUTROABS 1.2 (L) 08/22/2019 0836   NEUTROABS 4.6 01/01/2016 1000   LYMPHSABS 1.9 08/22/2019 0836   LYMPHSABS 1.6 01/01/2016 1000   MONOABS 1.0 08/22/2019 0836   EOSABS 0.0 08/22/2019 0836   EOSABS 0.3 01/01/2016 1000   BASOSABS 0.0 08/22/2019 0836   BASOSABS 0.0 01/01/2016 1000   Comprehensive Metabolic Panel:    Component Value Date/Time   NA 136 08/22/2019 0836   NA 139 01/01/2016 1000   K 3.6 08/22/2019 0836   CL 105 08/22/2019 0836   CO2 18 (L)  08/22/2019 0836   BUN 13 08/22/2019 0836   BUN 13 01/01/2016 1000   CREATININE 1.19 08/22/2019 0836   CREATININE 1.32 (H) 08/22/2018 1107   GLUCOSE 156 (H) 08/22/2019 0836   CALCIUM 9.3 08/22/2019 0836   AST 25 08/22/2019 0836   ALT 31 08/22/2019 0836   ALKPHOS 63 08/22/2019 0836   BILITOT 0.7 08/22/2019 0836   BILITOT 0.4 01/01/2016 1000   PROT 7.4 08/22/2019 0836   PROT 7.2 01/01/2016 1000   ALBUMIN 4.2 08/22/2019 0836   ALBUMIN 4.4 01/01/2016 1000    RADIOGRAPHIC STUDIES: CT Abdomen Pelvis Wo Contrast  Addendum Date: 07/25/2019   ADDENDUM REPORT: 07/25/2019 11:18 ADDENDUM: These results will be called to the ordering clinician or representative by the Radiologist Assistant, and communication documented in the PACS or zVision Dashboard. Electronically Signed   By: Zetta Bills M.D.   On: 07/25/2019 11:18   Result Date: 07/25/2019 CLINICAL DATA:   Stage IV lung cancer with brain metastases, restaging evaluation. Currently on systemic therapy EXAM: CT CHEST, ABDOMEN AND PELVIS WITHOUT CONTRAST TECHNIQUE: Multidetector CT imaging of the chest, abdomen and pelvis was performed following the standard protocol without IV contrast. COMPARISON:  None. CT chest 12/23/2018 and multiple prior studies. FINDINGS: CT CHEST FINDINGS Cardiovascular: Limited vascular assessment without contrast. No signs of acute process. Scattered atherosclerosis similar to prior study. Central pulmonary vasculature is of normal caliber. No signs of significant pericardial effusion. Heart size is stable. Mediastinum/Nodes: Scattered small lymph nodes in the anterior mediastinum. (Image 35, series 2) pre-vascular lymph node measuring 7 mm unchanged. (Image 39, series 2) high AP window lymph node, approximately 8 mm unchanged from prior exam. Other small lymph nodes are similarly unchanged. No signs of axillary lymphadenopathy. Thyroid with heterogeneity unchanged from prior study as well. Lungs/Pleura: Left paramediastinal fibrosis shows no interval change. Interval development of ground-glass in the right lower lobe. Airways are patent. No dense consolidation. No pleural effusion. Stable small nodule along the periphery of the right middle lobe measures approximately 4 mm. Musculoskeletal: No sign of acute bone finding, destructive bone lesion or chest wall mass. CT ABDOMEN PELVIS FINDINGS Hepatobiliary: Liver density slightly increased relative to vascular structures and spleen with compared to the previous study. Signs of hepatic cysts unchanged. No signs of overt biliary ductal dilation. Pancreas: Unremarkable. No pancreatic ductal dilatation or surrounding inflammatory changes. Spleen: Normal in size without focal abnormality. Adrenals/Urinary Tract: Normal appearance of the adrenal glands. No signs of acute renal abnormality. Low-density lesion in the upper pole of the left kidney  measures 10 Hounsfield units and is 1.6 cm not changed since 09/01/2016. Stomach/Bowel: Duodenal and jejunal diverticula, not changed. Colonic diverticulosis. No signs of acute diverticulitis. Appendix is normal. Vascular/Lymphatic: Calcified atherosclerosis throughout the abdominal aorta, the aorta is nondilated. No signs of upper abdominal or retroperitoneal adenopathy. No signs of pelvic adenopathy. Reproductive: Marked prostatomegaly is quite similar to the prior exam of December of 2017. Other: No abdominal wall hernia or abnormality. No abdominopelvic ascites. Musculoskeletal: Spinal degenerative change without acute or destructive bone process. IMPRESSION: 1. Interval development of ground-glass in the right lower lobe is nonspecific, may represent mild pneumonitis. Clinical correlation is suggested. 2. 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 3. No signs of metastatic disease to the abdomen is likely with improved steatosis when compared to prior study. 4. No other interval changes. Electronically Signed: By: Zetta Bills M.D. On: 07/25/2019 10:38   CT  Chest Wo Contrast  Addendum Date: 07/25/2019   ADDENDUM REPORT: 07/25/2019 11:18 ADDENDUM: These results will be called to the ordering clinician or representative by the Radiologist Assistant, and communication documented in the PACS or zVision Dashboard. Electronically Signed   By: Zetta Bills M.D.   On: 07/25/2019 11:18   Result Date: 07/25/2019 CLINICAL DATA:  Stage IV lung cancer with brain metastases, restaging evaluation. Currently on systemic therapy EXAM: CT CHEST, ABDOMEN AND PELVIS WITHOUT CONTRAST TECHNIQUE: Multidetector CT imaging of the chest, abdomen and pelvis was performed following the standard protocol without IV contrast. COMPARISON:  None. CT chest 12/23/2018 and multiple prior studies. FINDINGS: CT CHEST FINDINGS Cardiovascular: Limited vascular assessment  without contrast. No signs of acute process. Scattered atherosclerosis similar to prior study. Central pulmonary vasculature is of normal caliber. No signs of significant pericardial effusion. Heart size is stable. Mediastinum/Nodes: Scattered small lymph nodes in the anterior mediastinum. (Image 35, series 2) pre-vascular lymph node measuring 7 mm unchanged. (Image 39, series 2) high AP window lymph node, approximately 8 mm unchanged from prior exam. Other small lymph nodes are similarly unchanged. No signs of axillary lymphadenopathy. Thyroid with heterogeneity unchanged from prior study as well. Lungs/Pleura: Left paramediastinal fibrosis shows no interval change. Interval development of ground-glass in the right lower lobe. Airways are patent. No dense consolidation. No pleural effusion. Stable small nodule along the periphery of the right middle lobe measures approximately 4 mm. Musculoskeletal: No sign of acute bone finding, destructive bone lesion or chest wall mass. CT ABDOMEN PELVIS FINDINGS Hepatobiliary: Liver density slightly increased relative to vascular structures and spleen with compared to the previous study. Signs of hepatic cysts unchanged. No signs of overt biliary ductal dilation. Pancreas: Unremarkable. No pancreatic ductal dilatation or surrounding inflammatory changes. Spleen: Normal in size without focal abnormality. Adrenals/Urinary Tract: Normal appearance of the adrenal glands. No signs of acute renal abnormality. Low-density lesion in the upper pole of the left kidney measures 10 Hounsfield units and is 1.6 cm not changed since 09/01/2016. Stomach/Bowel: Duodenal and jejunal diverticula, not changed. Colonic diverticulosis. No signs of acute diverticulitis. Appendix is normal. Vascular/Lymphatic: Calcified atherosclerosis throughout the abdominal aorta, the aorta is nondilated. No signs of upper abdominal or retroperitoneal adenopathy. No signs of pelvic adenopathy. Reproductive: Marked  prostatomegaly is quite similar to the prior exam of December of 2017. Other: No abdominal wall hernia or abnormality. No abdominopelvic ascites. Musculoskeletal: Spinal degenerative change without acute or destructive bone process. IMPRESSION: 1. Interval development of ground-glass in the right lower lobe is nonspecific, may represent mild pneumonitis. Clinical correlation is suggested. 2. 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 3. No signs of metastatic disease to the abdomen is likely with improved steatosis when compared to prior study. 4. No other interval changes. Electronically Signed: By: Zetta Bills M.D. On: 07/25/2019 10:38   MR Brain W Wo Contrast  Result Date: 07/25/2019 CLINICAL DATA:  Metastatic lung cancer. History of whole-brain radiation. Blurred vision. EXAM: MRI HEAD WITHOUT AND WITH CONTRAST TECHNIQUE: Multiplanar, multiecho pulse sequences of the brain and surrounding structures were obtained without and with intravenous contrast. CONTRAST:  45m GADAVIST GADOBUTROL 1 MMOL/ML IV SOLN COMPARISON:  MRI head 03/21/2019 FINDINGS: Brain: Enhancing masslike lesion in the left temporal lobe shows significant progression. This has an irregular shape and central necrosis and measures approximately 27 x 22 mm. Previously, there were small discrete enhancing nodules in the left temporal lobe. There is  significant edema in the left temporal lobe white matter which has developed since the prior study. Numerous other treated lesions are present in the brain. Enhancing lesion in the midbrain is smaller now measuring 5.5 mm. Small white matter lesions in the right posterior temporal lobe are stable. Multiple small lesions in the left right frontal lobe are stable. 4 mm lesion left basal ganglia appears slightly larger. Left frontal lobe enhancing lesion is smaller with interval development of mild associated hemorrhage. Chronic  hemorrhagic infarcts in the parietal lobe bilaterally are unchanged. Negative for acute infarct. Ventricle size normal. No midline shift. Vascular: Normal arterial flow voids. Skull and upper cervical spine: No focal skeletal lesion. Sinuses/Orbits: Mucosal edema paranasal sinuses with cyst in the left maxillary sinus. Bilateral mastoid effusions which has progressed in the interval. Negative orbit. Other: None IMPRESSION: Progression of enhancing lesion in the left temporal lobe compared with the prior MRI. This has irregular contour and central necrosis with considerable surrounding edema. The configuration of the lesion raises the possibility of radiation necrosis however progressive metastatic disease is possible and close follow-up is recommended. Multiple additional enhancing lesions are stable to improved. Left basal ganglia lesion may be slightly larger. Chronic hemorrhagic infarcts in the parietal lobe bilaterally. Electronically Signed   By: Franchot Gallo M.D.   On: 07/25/2019 11:48    PERFORMANCE STATUS (ECOG) : 1 - Symptomatic but completely ambulatory  Review of Systems Unless otherwise noted, a complete review of systems is negative.  Physical Exam General: NAD, frail appearing, thin Pulmonary: Unlabored Extremities: no edema, no joint deformities Skin: no rashes Neurological: Weakness but otherwise nonfocal  IMPRESSION: I met with patient today in clinic.  Introduced palliative care services and attempted to establish therapeutic rapport.  Patient says that at baseline, he lives at home with his wife but is functionally independent with all of his own care.  Patient feels that he is doing reasonably well with tolerating treatments so far.  He denies any distressing symptoms.  He reports good oral intake.  Patient has had some weight loss recently.  Weight is down to 154 pounds from previous weight of 167 pounds 1 month ago.    Patient says he recognizes that the cancer is  incurable.  He does remain committed to ongoing treatment for now.  We discussed establishing ACP documents and I reviewed with him a MOST Form, which he took home to discuss with his wife.  PLAN: -Continue current scope of treatment -Recommend initiation of oral supplements twice daily -We will refer to RD due to weight loss -ACP/MOST Form reviewed -RTC in 2 to 3 weeks   Patient expressed understanding and was in agreement with this plan. He also understands that He can call the clinic at any time with any questions, concerns, or complaints.     Time Total: 20 minutes  Visit consisted of counseling and education dealing with the complex and emotionally intense issues of symptom management and palliative care in the setting of serious and potentially life-threatening illness.Greater than 50%  of this time was spent counseling and coordinating care related to the above assessment and plan.  Signed by: Altha Harm, PhD, NP-C

## 2019-08-22 NOTE — Assessment & Plan Note (Addendum)
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  MRI brain-overall improved except increasing left temporal lesion [see discussion below] patient currently OFF Rozyltrek 2pills sec to dizzyness  #MRI-November 2020-s/p whole brain radiation [April 21, 2019]-shows overall improvement; except for worsening ~2.5 cm left temporal lesion-question true progression versus pseudoprogression/radiation necrosis. Currently on avastin every 2 weeks. Improved clinically.   # Proceed with avastin cycle #2 today. Labs today reviewed;  acceptable for treatment today.   # Difficulty hearing-no cerumen impaction noted.await ENT evaluation on 12/16.   # Dizziness-unclear etiology-question brain metastases versus entrectinib [stopped dec1st 7183]- STABLE on avastin.   # CKD-stage III multifactorial [prostatitic obstruction/diabetes]  Stable.   # Bilateral PE & left lower extremity DVT:  on Xarelto; stable.   # DISPOSITION: # avastin today # follow up in 2 weeks-MD;labs- cbc/cmp/UA avastin- Dr.B

## 2019-08-23 ENCOUNTER — Ambulatory Visit (INDEPENDENT_AMBULATORY_CARE_PROVIDER_SITE_OTHER): Payer: Medicare Other | Admitting: Otolaryngology

## 2019-08-28 ENCOUNTER — Telehealth: Payer: Self-pay | Admitting: *Deleted

## 2019-08-28 ENCOUNTER — Inpatient Hospital Stay: Payer: Medicare Other

## 2019-08-28 DIAGNOSIS — Z20822 Contact with and (suspected) exposure to covid-19: Secondary | ICD-10-CM

## 2019-08-28 DIAGNOSIS — C3412 Malignant neoplasm of upper lobe, left bronchus or lung: Secondary | ICD-10-CM

## 2019-08-28 NOTE — Telephone Encounter (Signed)
Spoke with Dr. Rogue Bussing - MD recommended pt to be tested for covid and also have all oncology apts be r/s to 1/5.  Pt aware. I provided her with the information for covid testing and phone numbers to reach out to the testing site for an apt.  covid testing ordered per md order.

## 2019-08-28 NOTE — Progress Notes (Signed)
Nutrition Assessment   Reason for Assessment:  Referral from Kendall West, NP for weight loss   ASSESSMENT:  68 year old male with stage IV adenocarcinoma of the lung with metastatic disease to brain. Past medical history of CKD, CVA, DM, dyslipidemia, HLD, HTN.  Patient receiving avastin.    Spoke with patient via phone.  Patient reports that he could go all day without eating.  Reports in the morning will eat oatmeal or rice with butter and sugar or egg and bacon with juice or water.  Lunch yesterday ate pinto beans, cabbage and cornbread.  Often does not eat supper.  Reports that wife prepares meals.  Reports that he has had ensure/boost shakes before but caused diarrhea.  Reports that he has not had diarrhea for the last few days.  Sometimes has it several times per week.  Does not take anything for it.    Patient reports that he is currently on quarantine due to coming in contact with his son who tested positive for COVID-19.  He says that he does not have any symptoms and has not been tested.  He is on quarantine until 12/30 and wants to reschedule his 12/29 appointments.   Nutrition Focused Physical Exam: deferred   Medications: tresiba, metformin, zofran   Labs: glucose 156   Anthropometrics:   Height: 66 inches Weight: 154 lb UBW: 162 lb per patient noted 11/24 last at that weight BMI: 26  5% weight loss in the last month   NUTRITION DIAGNOSIS: Inadequate oral intake related to poor appetite as evidenced by 5% weight loss    INTERVENTION:  Discussed ways to increase calories and protein with patient.  Will mail High Calorie, High protein Nutrition Therapy from [facility] to patient along with high calorie recipes.   Discussed juice type shakes for patient to try.  Message sent to scheduling regarding rescheduling 12/29 appointments.  Encouraged patient to discuss diarrhea frequency with MD. Contact information provided to patient.   MONITORING, EVALUATION, GOAL: Patient will  consume adequate calories and protein to prevent weight loss   Next Visit: Jan 21 phone f/u  Shanterria Franta B. Zenia Resides, [place], [place] Registered Dietitian [phone] (pager)

## 2019-08-28 NOTE — Telephone Encounter (Signed)
Per Desmond Lope - "I spoke with him via phone and he said that he is on quarantine until 12/30 due to his son testing postive for covid 62. Patient has not had any symptoms and has not been tested. He has appointments scheduled for 12/29. Patient was saying that he needed to reschedule his appointments on 12/29."

## 2019-08-29 ENCOUNTER — Ambulatory Visit (INDEPENDENT_AMBULATORY_CARE_PROVIDER_SITE_OTHER): Payer: Medicare Other | Admitting: Otolaryngology

## 2019-08-29 ENCOUNTER — Ambulatory Visit: Payer: Medicare Other | Attending: Internal Medicine

## 2019-08-29 DIAGNOSIS — Z20822 Contact with and (suspected) exposure to covid-19: Secondary | ICD-10-CM

## 2019-08-31 ENCOUNTER — Telehealth: Payer: Self-pay | Admitting: Internal Medicine

## 2019-08-31 LAB — NOVEL CORONAVIRUS, NAA: SARS-CoV-2, NAA: DETECTED — AB

## 2019-08-31 NOTE — Telephone Encounter (Signed)
Called pt to discuss the results COVID-19 POSITIVE results. Cannot leave Voice mail.   Called [facility]- [facility]-- LVM x 2.   Heather- please try contacting pt/family later again.   FYI-Dr.Sowles.

## 2019-08-31 NOTE — Telephone Encounter (Signed)
Spoke to patient/and his wife regarding patient's Covid positive results.  Patient is asymptomatic.  Recommend getting a pulse ox; if oxygen levels low 90s/less than 90 recommend informing us/PCP.  If significant symptomatic-recommend going to ER.  #Patient son tested positive recently; asymptomatic.   #Recommend patient's wife-call her PCP for testing.   #Recommend informing health department of the husband's Covid positive results.  Reminded of quarantine-under quarantine since December 16th as per the wife.   Keep appointment with [place] as planned on January 5th.

## 2019-08-31 NOTE — Telephone Encounter (Signed)
Patient returning call [phone]

## 2019-08-31 NOTE — Telephone Encounter (Signed)
4388 am - Call attempt to reach patient - mail box is full.

## 2019-09-02 ENCOUNTER — Other Ambulatory Visit: Payer: Self-pay

## 2019-09-02 ENCOUNTER — Emergency Department: Payer: Medicare Other

## 2019-09-02 ENCOUNTER — Telehealth: Payer: Self-pay | Admitting: Hematology and Oncology

## 2019-09-02 ENCOUNTER — Encounter: Payer: Self-pay | Admitting: Emergency Medicine

## 2019-09-02 ENCOUNTER — Emergency Department
Admission: EM | Admit: 2019-09-02 | Discharge: 2019-09-02 | Disposition: A | Discharge status: Home / Self Care | Payer: Medicare Other | Attending: Student | Admitting: Student

## 2019-09-02 DIAGNOSIS — Z79899 Other long term (current) drug therapy: Secondary | ICD-10-CM | POA: Diagnosis not present

## 2019-09-02 DIAGNOSIS — R1032 Left lower quadrant pain: Secondary | ICD-10-CM | POA: Diagnosis present

## 2019-09-02 DIAGNOSIS — C799 Secondary malignant neoplasm of unspecified site: Secondary | ICD-10-CM | POA: Insufficient documentation

## 2019-09-02 DIAGNOSIS — K5733 Diverticulitis of large intestine without perforation or abscess with bleeding: Secondary | ICD-10-CM | POA: Diagnosis not present

## 2019-09-02 DIAGNOSIS — Z794 Long term (current) use of insulin: Secondary | ICD-10-CM | POA: Diagnosis not present

## 2019-09-02 DIAGNOSIS — E1122 Type 2 diabetes mellitus with diabetic chronic kidney disease: Secondary | ICD-10-CM | POA: Diagnosis not present

## 2019-09-02 DIAGNOSIS — C3490 Malignant neoplasm of unspecified part of unspecified bronchus or lung: Secondary | ICD-10-CM | POA: Diagnosis not present

## 2019-09-02 DIAGNOSIS — K5792 Diverticulitis of intestine, part unspecified, without perforation or abscess without bleeding: Secondary | ICD-10-CM | POA: Diagnosis not present

## 2019-09-02 DIAGNOSIS — Z7901 Long term (current) use of anticoagulants: Secondary | ICD-10-CM | POA: Insufficient documentation

## 2019-09-02 DIAGNOSIS — N183 Chronic kidney disease, stage 3 unspecified: Secondary | ICD-10-CM | POA: Insufficient documentation

## 2019-09-02 LAB — URINALYSIS, COMPLETE (UACMP) WITH MICROSCOPIC
Bacteria, UA: NONE SEEN
Bilirubin Urine: NEGATIVE
Glucose, UA: 50 mg/dL — AB
Hgb urine dipstick: NEGATIVE
Ketones, ur: 20 mg/dL — AB
Leukocytes,Ua: NEGATIVE
Nitrite: NEGATIVE
Protein, ur: 100 mg/dL — AB
Specific Gravity, Urine: 1.023 (ref 1.005–1.030)
pH: 5 (ref 5.0–8.0)

## 2019-09-02 LAB — COMPREHENSIVE METABOLIC PANEL
ALT: 25 U/L (ref 0–44)
AST: 28 U/L (ref 15–41)
Albumin: 3.9 g/dL (ref 3.5–5.0)
Alkaline Phosphatase: 72 U/L (ref 38–126)
Anion gap: 15 (ref 5–15)
BUN: 11 mg/dL (ref 8–23)
CO2: 20 mmol/L — ABNORMAL LOW (ref 22–32)
Calcium: 9.6 mg/dL (ref 8.9–10.3)
Chloride: 103 mmol/L (ref 98–111)
Creatinine, Ser: 1.08 mg/dL (ref 0.61–1.24)
GFR calc Af Amer: >60 mL/min (ref >60)
GFR calc non Af Amer: >60 mL/min (ref >60)
Glucose, Bld: 189 mg/dL — ABNORMAL HIGH (ref 70–99)
Potassium: 3.8 mmol/L (ref 3.5–5.1)
Sodium: 138 mmol/L (ref 135–145)
Total Bilirubin: 1 mg/dL (ref 0.3–1.2)
Total Protein: 8.2 g/dL — ABNORMAL HIGH (ref 6.5–8.1)

## 2019-09-02 LAB — CBC
HCT: 46.8 % (ref 39.0–52.0)
Hemoglobin: 16.1 g/dL (ref 13.0–17.0)
MCH: 29.4 pg (ref 26.0–34.0)
MCHC: 34.4 g/dL (ref 30.0–36.0)
MCV: 85.6 fL (ref 80.0–100.0)
Platelets: 228 10*3/uL (ref 150–400)
RBC: 5.47 MIL/uL (ref 4.22–5.81)
RDW: 13.4 % (ref 11.5–15.5)
WBC: 6.2 10*3/uL (ref 4.0–10.5)
nRBC: 0 % (ref 0.0–0.2)

## 2019-09-02 LAB — LIPASE, BLOOD: Lipase: 22 U/L (ref 11–51)

## 2019-09-02 MED ORDER — IOHEXOL 300 MG/ML  SOLN
100.0000 mL | Freq: Once | INTRAMUSCULAR | Status: AC | PRN
  Administered 2019-09-02: 100 mL via INTRAVENOUS

## 2019-09-02 MED ORDER — SODIUM CHLORIDE 0.9% FLUSH
3.0000 mL | Freq: Once | INTRAVENOUS | Status: AC
  Administered 2019-09-02: 3 mL via INTRAVENOUS

## 2019-09-02 MED ORDER — SODIUM CHLORIDE 0.9 % IV BOLUS
1000.0000 mL | Freq: Once | INTRAVENOUS | Status: AC
  Administered 2019-09-02: 1000 mL via INTRAVENOUS

## 2019-09-02 MED ORDER — CIPROFLOXACIN HCL 500 MG PO TABS: 500.0000 mg | ORAL_TABLET | Freq: Two times a day (BID) | ORAL | 0 refills | Status: AC

## 2019-09-02 MED ORDER — METRONIDAZOLE 500 MG PO TABS: 500.0000 mg | ORAL_TABLET | Freq: Three times a day (TID) | ORAL | 0 refills | Status: AC

## 2019-09-02 MED ORDER — CIPROFLOXACIN IN D5W 400 MG/200ML IV SOLN
400.0000 mg | Freq: Once | INTRAVENOUS | Status: AC
  Administered 2019-09-02: 400 mg via INTRAVENOUS
  Filled 2019-09-02: qty 200

## 2019-09-02 MED ORDER — METRONIDAZOLE IN NACL 5-0.79 MG/ML-% IV SOLN
500.0000 mg | Freq: Once | INTRAVENOUS | Status: AC
  Administered 2019-09-02: 500 mg via INTRAVENOUS
  Filled 2019-09-02: qty 100

## 2019-09-02 NOTE — ED Notes (Signed)
Gave pt crackers and peanut butter per Dr. Joan Mayans.

## 2019-09-02 NOTE — Telephone Encounter (Signed)
Re:  LLQ pain  Patient notes a 2 day history of increasing LLQ pain with diarrhea.  No blood in the stool. No fever.  History of diverticulosis noted on last colonoscopy.  Patient on Avastin.  Risk of bowel perforation <= 3%.  Abdominal pain grade 3-4 10%.  Diarrhea 20-40%.  Patient tested COVID + 08/29/2019.  Patient to be seen in the ER.   Lequita Asal, MD

## 2019-09-02 NOTE — ED Notes (Signed)
Pt taken to CT.

## 2019-09-02 NOTE — ED Triage Notes (Signed)
L lower abdominal pain x 2 days.

## 2019-09-02 NOTE — Discharge Instructions (Signed)
Thank you for letting us take care of you in the emergency department today.   Please continue to take any regular, prescribed medications.   New medications we have prescribed:  - Ciprofloxacin - Metronidazole  Please follow up with: - Your primary care doctor to review your ER visit and follow up on your symptoms.   Please return to the ER for any new or worsening symptoms.

## 2019-09-02 NOTE — ED Notes (Addendum)
First Nurse Note: Pt to ED via [place] with daughter. 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 Pt is having abd pain. Pt is Avastin.   Pt dtr [phone]- Hinton Dyer

## 2019-09-02 NOTE — ED Provider Notes (Signed)
[facility] Emergency Department Provider Note  ____________________________________________   First MD Initiated Contact with Patient 09/02/19 1501     (approximate)  I have reviewed the triage vital signs and the nursing notes.  History  Chief Complaint Abdominal Pain    HPI Melvin Willis is a 68 y.o. male with hx of lung cancer, also recently diagnosed with COVID on 12/22 who presents for LLQ abdominal pain. Pain present x 2 days. Constant. Dull/cramping. Moderate in severity. No radiation. No alleviating or aggravating factors. Associated with diarrhea. No fevers, nausea, vomiting, dysuria, hematuria, or hx of stones. Regarding his recent COVID diagnosis, he has no complaints of fever, cough, difficulty breathing.    Past Medical Hx Past Medical History:  Diagnosis Date  . Abnormal prostate specific antigen 08/08/2012  . Adiposity 04/16/2015  . Chronic kidney disease (CKD), stage III (moderate) 11/27/2016  . CVA (cerebral vascular accident) ([place]) 06/17/2016  . Diabetes mellitus without complication ([place])   . Diverticulosis of sigmoid colon 04/16/2015  . Dyslipidemia 03/18/2015  . Hemorrhoids, internal 04/16/2015  . Hypercholesteremia 04/16/2015  . Hyperlipidemia   . Hypertension   . Primary cancer of left upper lobe of lung ([place])   . Pulmonary embolism ([place])   . Wears dentures    partial upper    Problem List Patient Active Problem List   Diagnosis Date Noted  . Goals of care, counseling/discussion 08/02/2019  . Primary malignant neoplasm of lung metastatic to other site ([place]) 04/07/2019  . Chronic deep vein thrombosis (DVT) of left lower extremity ([place]) 01/05/2018  . Chronic kidney disease (CKD), stage III (moderate) 11/27/2016  . Old cerebrovascular accident (CVA) without late effect   . Blurred vision, bilateral 06/23/2016  . Primary cancer of left upper lobe of lung ([place]) 06/12/2016  . Mediastinal mass   . History of nephrolithiasis  03/27/2016  . Pharyngeal dysphagia 01/01/2016  . Benign neoplasm of ascending colon   . Benign neoplasm of sigmoid colon   . Benign neoplasm of transverse colon   . Diverticulosis of large intestine without diverticulitis   . Overweight (BMI 25.0-29.9) 04/16/2015  . Controlled diabetes mellitus with chronic kidney disease ([place]) 03/18/2015  . Dyslipidemia 03/18/2015  . Disorder of male genital organ 08/08/2012  . 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 Elevated prostate specific antigen (PSA) 08/08/2012    Past Surgical Hx Past Surgical History:  Procedure Laterality Date  . COLONOSCOPY    . COLONOSCOPY WITH PROPOFOL N/A 05/06/2015   Procedure: COLONOSCOPY WITH PROPOFOL;  Surgeon: Lucilla Lame, MD;  Location: [place];  Service: Endoscopy;  Laterality: N/A;  ASCENDING COLON POLYPS X 2 TERMINAL ILEUM BIOPSY RANDOM COLON BX. TRANSVERSE COLON POLYP SIGMOID COLON POLYP  . ESOPHAGOGASTRODUODENOSCOPY (EGD) WITH PROPOFOL N/A 05/06/2015   Procedure: ESOPHAGOGASTRODUODENOSCOPY (EGD) WITH PROPOFOL;  Surgeon: Lucilla Lame, MD;  Location: [place];  Service: Endoscopy;  Laterality: N/A;  GASTRIC BIOPSY X1  . KIDNEY STONE SURGERY  2017    Medications Prior to Admission medications   Medication Sig Start Date End Date Taking? Authorizing Provider  atorvastatin (LIPITOR) 40 MG tablet Take 1 tablet (40 mg total) by mouth 3 (three) times a week. 12/30/18   [name], MD  blood glucose meter kit and supplies Dispense based on patient and insurance preference (Accuchek Aviva Plus, Accuchek Nano). Use up to four times daily as directed. (FOR ICD-10 E10.9, E11.9). 05/08/19   [name], MD  entrectinib (ROZLYTREK) 200 MG capsule Take 3 capsules (  600 mg total) by mouth daily. Patient not taking: Reported on 08/07/2019 03/22/19   Cammie Sickle, MD  fluticasone [facility]) 50 MCG/ACT nasal spray Place 2 sprays into both nostrils daily. 08/07/19   [name], MD  insulin degludec (TRESIBA FLEXTOUCH) 100 UNIT/ML SOPN FlexTouch Pen Inject 0.26 mLs (26 Units total) into the skin daily. 07/30/19   [name], MD  metFORMIN (GLUCOPHAGE) 500 MG tablet Take 1 tablet (500 mg total) by mouth 3 (three) times daily. 08/07/19   [name], MD  ondansetron (ZOFRAN) 8 MG tablet One pill every 8 hours as needed for nausea/vomitting. 03/22/19   Cammie Sickle, MD  rivaroxaban (XARELTO) 20 MG TABS tablet TAKE 1 TABLET BY MOUTH ONCE DAILY WITH  SUPPER 06/13/19   Cammie Sickle, MD  tamsulosin (FLOMAX) 0.4 MG CAPS capsule Take 1 capsule (0.4 mg total) by mouth every morning. 09/30/18   Stoioff, Ronda Fairly, MD    Allergies Patient has no known allergies.  Family Hx Family History  Problem Relation Age of Onset  . Diabetes Mother   . Diabetes Father   . CAD Father   . Dementia Father   . Diabetes Sister   . Cancer Maternal Uncle        Prostate  . Cancer Cousin        prostate    Social Hx Social History   Tobacco Use  . Smoking status: Never Smoker  . Smokeless tobacco: Never Used  Substance Use Topics  . Alcohol use: No    Alcohol/week: 0.0 standard drinks  . Drug use: No     Review of Systems  Constitutional: Negative for fever, chills. Eyes: Negative for visual changes. ENT: Negative for sore throat. Cardiovascular: Negative for chest pain. Respiratory: Negative for shortness of breath. Gastrointestinal: + abdominal pain, diarrhea Genitourinary: Negative for dysuria. Musculoskeletal: Negative for leg swelling. Skin: Negative for rash. Neurological: Negative for for headaches.   Physical Exam  Vital Signs: ED Triage Vitals  Enc Vitals Group     BP 09/02/19 1314 (!) 149/92     Pulse Rate 09/02/19 1314 (!) 102     Resp 09/02/19 1314 20     Temp 09/02/19 1314 98.7 F (37.1 C)     Temp Source 09/02/19 1314 Oral     SpO2 09/02/19 1314 98 %     Weight 09/02/19 1316 154 lb (69.9 kg)     Height 09/02/19  1316 _0  (1.626 m)     Head Circumference --      Peak Flow --      Pain Score 09/02/19 1316 10     Pain Loc --      Pain Edu? --      Excl. in [place]? --     Constitutional: Alert and oriented.  Head: Normocephalic. Atraumatic. Eyes: Conjunctivae clear. Sclera anicteric. Nose: No congestion. No rhinorrhea. Mouth/Throat: Wearing mask.  Neck: No stridor.   Cardiovascular: Normal rate, regular rhythm. Extremities well perfused. Respiratory: Normal respiratory effort.  Lungs CTAB. Gastrointestinal: Soft. TTP in LLQ. No rebound/guarding. Remainder abdomen NT. No distension.  Musculoskeletal: No lower extremity edema. No deformities. Neurologic:  Normal speech and language. No gross focal neurologic deficits are appreciated.  Skin: Skin is warm, dry and intact. No rash noted. Psychiatric: Mood and affect are appropriate for situation.   Radiology  CT: IMPRESSION:  Acute diverticulitis at the descending sigmoid junction without  evidence of extraluminal gas or abscess.   Marked prostatic enlargement, prostate gland  7.6 x 6.9 x 8.0 cm  extending into bladder base.   Hepatic and LEFT renal cysts.   Patchy RIGHT basilar pulmonary infiltrate question pneumonia.   Small umbilical hernia containing fat.   Aortic Atherosclerosis (ICD10-I70.0).    Procedures  Procedure(s) performed (including critical care):  Procedures   Initial Impression / Assessment and Plan / ED Course  68 y.o. male who presents to the ED for LLQ pain, diarrhea, as above.  Ddx: diverticulitis, colitis, stone, COVID  Work up reveals acute, uncomplicated diverticulitis. He is afebrile, no white count. Received dose of IV antibiotics, tolerated PO in the ED and reports improvement in pain. As such, will d/c with Rx for PO antibiotics. Advised outpatient follow up as needed. Given return precautions.    Final Clinical Impression(s) / ED Diagnosis  Final diagnoses:  Diverticulitis       Note:  This  document was prepared using Dragon voice recognition software and may include unintentional dictation errors.   Lilia Pro., MD 09/03/19 [phone]

## 2019-09-02 NOTE — ED Notes (Signed)
Assisted pt to bathroom

## 2019-09-04 ENCOUNTER — Telehealth: Payer: Self-pay | Admitting: *Deleted

## 2019-09-04 ENCOUNTER — Encounter: Payer: Self-pay | Admitting: Family Medicine

## 2019-09-04 DIAGNOSIS — I700 Atherosclerosis of aorta: Secondary | ICD-10-CM | POA: Insufficient documentation

## 2019-09-04 NOTE — Telephone Encounter (Signed)
Ok to treat with Cipro with current medication list.

## 2019-09-04 NOTE — Telephone Encounter (Signed)
Call returned to patient and informed ok to take all medications with his new prescription of cipro

## 2019-09-04 NOTE — Telephone Encounter (Signed)
Patient states that he is doing well. He was evaluated at the ED this weekend and treated for Diverticulitis, he was treated with Cipro and Metronidazole. His wife wants to know if he is able to use Miralax because he has some constipation. She has stopped his regular medication, can he take his regular medications along with treatment for diverticulitis.

## 2019-09-04 NOTE — Telephone Encounter (Signed)
Patient called reporting that he was given some new medicine and is asking if he can continue taking his regular medicine with it. Looks like he went to ER and was given Cipro. Please advise

## 2019-09-04 NOTE — Telephone Encounter (Signed)
He does not think he needs to be seen for appointment.

## 2019-09-05 ENCOUNTER — Inpatient Hospital Stay: Payer: Medicare Other

## 2019-09-05 ENCOUNTER — Inpatient Hospital Stay: Payer: Medicare Other | Admitting: Internal Medicine

## 2019-09-05 ENCOUNTER — Inpatient Hospital Stay: Payer: Medicare Other | Admitting: Hospice and Palliative Medicine

## 2019-09-05 NOTE — Telephone Encounter (Signed)
Patient called. Transferred to front for appointment.

## 2019-09-06 ENCOUNTER — Ambulatory Visit (INDEPENDENT_AMBULATORY_CARE_PROVIDER_SITE_OTHER): Payer: Medicare Other | Admitting: Family Medicine

## 2019-09-06 ENCOUNTER — Encounter: Payer: Self-pay | Admitting: Family Medicine

## 2019-09-06 ENCOUNTER — Other Ambulatory Visit: Payer: Self-pay

## 2019-09-06 DIAGNOSIS — E1122 Type 2 diabetes mellitus with diabetic chronic kidney disease: Secondary | ICD-10-CM

## 2019-09-06 DIAGNOSIS — U071 COVID-19: Secondary | ICD-10-CM | POA: Diagnosis not present

## 2019-09-06 DIAGNOSIS — K5792 Diverticulitis of intestine, part unspecified, without perforation or abscess without bleeding: Secondary | ICD-10-CM

## 2019-09-06 DIAGNOSIS — R82998 Other abnormal findings in urine: Secondary | ICD-10-CM

## 2019-09-06 DIAGNOSIS — N183 Chronic kidney disease, stage 3 unspecified: Secondary | ICD-10-CM | POA: Diagnosis not present

## 2019-09-06 MED ORDER — TRESIBA FLEXTOUCH 100 UNIT/ML ~~LOC~~ SOPN: 10.0000 [IU] | PEN_INJECTOR | Freq: Every day | SUBCUTANEOUS | 0 refills | Status: DC

## 2019-09-06 MED ORDER — GUAIFENESIN ER 600 MG PO TB12: 600.0000 mg | ORAL_TABLET | Freq: Two times a day (BID) | ORAL | 0 refills | Status: DC

## 2019-09-06 NOTE — Progress Notes (Signed)
Name: Melvin Willis   MRN: [id number]    DOB: 05/21/1951   Date:09/06/2019       Progress Note  Subjective  Chief Complaint  Chief Complaint  Patient presents with  . Diverticulitis    ER follow up  . Hematuria    I connected with  [name] on 09/06/19 at  7:20 AM EST by telephone and verified that I am speaking with the correct person using two identifiers.  I discussed the limitations, risks, security and privacy concerns of performing an evaluation and management service by telephone and the availability of in person appointments. Staff also discussed with the patient that there may be a patient responsible charge related to this service. Patient Location: Home Provider Location: Office Additional Individuals present: Wife  HPI  Diverticulitis: PT was seen 09/02/2019 for diverticulitis in ER.  He was sent home on 10-day Cipro and flagyl courses.  Labs: CBC and Lipase WNL, CMP with no actionable derangements.  He notes feeling much better - abdominal pain has resolved.  Denies blood in stool, but does note his urine has been darker but admits to not drinking enough water.  Denies back pain, fevers//chills,  Appetite is decreased lately. - CT Abdomen noted acute diverticulitis; also noted marked prostatic enlargement extending into the bladder base, hepatic and left renal cysts, small umbilical hernia, and patchy right basilar pulmonary infiltrate.    COVID-19 Infection: Diagnosed 08/29/2019 and is under quarantine at this time.  Denies chest pain, shortness of breath, or cough.  He notes thick phlegm in throat - will trial mucinex.  DM: Checked postprandial yesterday and it was 200.  Taking metformin daily and tresiba 10 units daily.  He has not been checking fasting BG's.  Patient Active Problem List   Diagnosis Date Noted  . Atherosclerosis of aorta ([place]) 09/04/2019  . Goals of care, counseling/discussion 08/02/2019  . Primary malignant neoplasm of lung  metastatic to other site ([place]) 04/07/2019  . Chronic deep vein thrombosis (DVT) of left lower extremity ([place]) 01/05/2018  . Chronic kidney disease (CKD), stage III (moderate) 11/27/2016  . Old cerebrovascular accident (CVA) without late effect   . Blurred vision, bilateral 06/23/2016  . Primary cancer of left upper lobe of lung ([place]) 06/12/2016  . Mediastinal mass   . History of nephrolithiasis 03/27/2016  . Pharyngeal dysphagia 01/01/2016  . Benign neoplasm of ascending colon   . Benign neoplasm of sigmoid colon   . Benign neoplasm of transverse colon   . Diverticulosis of large intestine without diverticulitis   . Overweight (BMI 25.0-29.9) 04/16/2015  . Controlled diabetes mellitus with chronic kidney disease ([place]) 03/18/2015  . Dyslipidemia 03/18/2015  . Disorder of male genital organ 08/08/2012  . 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 Elevated prostate specific antigen (PSA) 08/08/2012    Past Surgical History:  Procedure Laterality Date  . COLONOSCOPY    . COLONOSCOPY WITH PROPOFOL N/A 05/06/2015   Procedure: COLONOSCOPY WITH PROPOFOL;  Surgeon: Lucilla Lame, MD;  Location: [place];  Service: Endoscopy;  Laterality: N/A;  ASCENDING COLON POLYPS X 2 TERMINAL ILEUM BIOPSY RANDOM COLON BX. TRANSVERSE COLON POLYP SIGMOID COLON POLYP  . ESOPHAGOGASTRODUODENOSCOPY (EGD) WITH PROPOFOL N/A 05/06/2015   Procedure: ESOPHAGOGASTRODUODENOSCOPY (EGD) WITH PROPOFOL;  Surgeon: Lucilla Lame, MD;  Location: [place];  Service: Endoscopy;  Laterality: N/A;  GASTRIC BIOPSY X1  . KIDNEY STONE SURGERY  2017    Family History  Problem Relation Age of Onset  .  Diabetes Mother   . Diabetes Father   . CAD Father   . Dementia Father   . Diabetes Sister   . Cancer Maternal Uncle        Prostate  . Cancer Cousin        prostate    Social History   Socioeconomic History  . Marital status: Married    Spouse name: Melissa   . Number of children: 3  .  Years of education: Not on file  . Highest education level: Not on file  Occupational History  . Occupation: disable     Comment: from CVA and lung cancer  Tobacco Use  . Smoking status: Never Smoker  . Smokeless tobacco: Never Used  Substance and Sexual Activity  . Alcohol use: No    Alcohol/week: 0.0 standard drinks  . Drug use: No  . Sexual activity: Yes    Partners: Female  Other Topics Concern  . Not on file  Social History Narrative   Used to work until 2 years ago when got sick with DVT leg , PE , CVA and found out he had lung cancer. He has medicare now    Social Determinants of Radio broadcast assistant Strain:   . Difficulty of Paying Living Expenses: Not on file  Food Insecurity:   . Worried About Charity fundraiser in the Last Year: Not on file  . Ran Out of Food in the Last Year: Not on file  Transportation Needs:   . Lack of Transportation (Medical): Not on file  . Lack of Transportation (Non-Medical): Not on file  Physical Activity: Insufficiently Active  . Days of Exercise per Week: 3 days  . Minutes of Exercise per Session: 30 min  Stress:   . Feeling of Stress : Not on file  Social Connections:   . Frequency of Communication with Friends and Family: Not on file  . Frequency of Social Gatherings with Friends and Family: Not on file  . Attends Religious Services: Not on file  . Active Member of Clubs or Organizations: Not on file  . Attends Archivist Meetings: Not on file  . Marital Status: Not on file  Intimate Partner Violence:   . Fear of Current or Ex-Partner: Not on file  . Emotionally Abused: Not on file  . Physically Abused: Not on file  . Sexually Abused: Not on file     Current Outpatient Medications:  .  atorvastatin (LIPITOR) 40 MG tablet, Take 1 tablet (40 mg total) by mouth 3 (three) times a week., Disp: 48 tablet, Rfl: 1 .  blood glucose meter kit and supplies, Dispense based on patient and insurance preference (Accuchek  Aviva Plus, Accuchek Nano). Use up to four times daily as directed. (FOR ICD-10 E10.9, E11.9)., Disp: 1 each, Rfl: 0 .  ciprofloxacin (CIPRO) 500 MG tablet, Take 1 tablet (500 mg total) by mouth 2 (two) times daily for 10 days., Disp: 20 tablet, Rfl: 0 .  fluticasone (FLONASE) 50 MCG/ACT nasal spray, Place 2 sprays into both nostrils daily., Disp: 16 g, Rfl: 2 .  insulin degludec (TRESIBA FLEXTOUCH) 100 UNIT/ML SOPN FlexTouch Pen, Inject 0.26 mLs (26 Units total) into the skin daily., Disp: 15 mL, Rfl: 0 .  metFORMIN (GLUCOPHAGE) 500 MG tablet, Take 1 tablet (500 mg total) by mouth 3 (three) times daily., Disp: 270 tablet, Rfl: 0 .  metroNIDAZOLE (FLAGYL) 500 MG tablet, Take 1 tablet (500 mg total) by mouth 3 (three) times daily for  10 days., Disp: 30 tablet, Rfl: 0 .  ondansetron (ZOFRAN) 8 MG tablet, One pill every 8 hours as needed for nausea/vomitting., Disp: 40 tablet, Rfl: 1 .  rivaroxaban (XARELTO) 20 MG TABS tablet, TAKE 1 TABLET BY MOUTH ONCE DAILY WITH  SUPPER, Disp: 30 tablet, Rfl: 6 .  tamsulosin (FLOMAX) 0.4 MG CAPS capsule, Take 1 capsule (0.4 mg total) by mouth every morning., Disp: 90 capsule, Rfl: 3 .  entrectinib (ROZLYTREK) 200 MG capsule, Take 3 capsules (600 mg total) by mouth daily. (Patient not taking: Reported on 08/07/2019), Disp: 90 capsule, Rfl: 6  No Known Allergies  I personally reviewed active problem list, medication list, allergies, notes from last encounter, lab results with the patient/caregiver today.   ROS  Ten systems reviewed and is negative except as mentioned in HPI  Objective  Virtual encounter, vitals not obtained.  There is no height or weight on file to calculate BMI.  Physical Exam  Pulmonary/Chest: Effort normal. No respiratory distress. Speaking in complete sentences Neurological: Pt is alert and oriented to person, place, and time. Speech is normal. Psychiatric: Patient has a normal mood and affect. behavior is normal. Judgment and thought  content normal.   No results found for this or any previous visit (from the past 72 hour(s)).  PHQ2/9: Depression screen [facility] 2/9 09/06/2019 08/07/2019 04/07/2019 12/29/2018 08/22/2018  Decreased Interest 0 0 0 0 0  Down, Depressed, Hopeless 0 0 0 0 0  PHQ - 2 Score 0 0 0 0 0  Altered sleeping 0 0 0 0 -  Tired, decreased energy 0 0 0 0 -  Change in appetite 0 0 0 0 -  Feeling bad or failure about yourself  0 0 0 0 -  Trouble concentrating 0 0 0 0 -  Moving slowly or fidgety/restless 0 0 0 0 -  Suicidal thoughts 0 0 0 0 -  PHQ-9 Score 0 0 0 0 -  Difficult doing work/chores Not difficult at all - Not difficult at all Not difficult at all -  Some recent data might be hidden   PHQ-2/9 Result is negative.    Fall Risk: Fall Risk  09/06/2019 08/07/2019 04/07/2019 12/29/2018 08/22/2018  Falls in the past year? 0 0 0 0 0  Number falls in past yr: 0 0 0 0 -  Injury with Fall? 0 0 0 0 -  Follow up Falls evaluation completed - - - -    Assessment & Plan  1. Acute diverticulitis - Encouraged to complete course of antibiotics, slowly advance diet as tolerated, push fluids as tolerated - has Zofran PRN for nausea. - CBC with Differential/Platelet; Future - Comprehensive metabolic panel; Future  2. COVID-19 virus infection - He is feeling well overall from a respiratory standpoint.  Suspect the right basilar infiltrate seen on CT secondary to COVID-19 viral infection.  Discussed repeat CXR if he develops cough, shortness of breath, fevers/chills, or other concerning signs and symptoms. - CBC with Differential/Platelet; Future - guaiFENesin (MUCINEX) 600 MG 12 hr tablet; Take 1 tablet (600 mg total) by mouth 2 (two) times daily.  Dispense: 20 tablet; Refill: 0  3. Controlled type 2 diabetes mellitus with chronic kidney disease, without long-term current use of insulin, unspecified CKD stage ([place]) - He is taking 10 units tresiba nightly and compliant with metformin.  He will begin checking  fasting daily while ill and if above 200 will call our office.  Discussed calling for polyuria, polydipsia, or polyphagia as well. - Comprehensive  metabolic panel; Future - recheck kidney function to ensure no AKI/dehydration - [facility].  6. CKD (chronic kidney disease), stage III - insulin degludec (TRESIBA FLEXTOUCH) 100 UNIT/ML SOPN FlexTouch Pen; Inject 0.1 mLs (10 Units total) into the skin daily.  Dispense: 15 mL; Refill: 0 - Urinalysis, Routine w reflex microscopic; Future  7. Dark urine - Unclear if hematuria or needing to increase fluids.  His UA in ER 4 days ago was negative for blood, however I am ordering repeat today. - Urinalysis, Routine w reflex microscopic; Future - Sending for stat labs to [facility] mall - CBC and CMP to check for anemia, AKI, hydration status.  I discussed the assessment and treatment plan with the patient. The patient was provided an opportunity to ask questions and all were answered. The patient agreed with the plan and demonstrated an understanding of the instructions.   The patient was advised to call back or seek an in-person evaluation if the symptoms worsen or if the condition fails to improve as anticipated.  I provided 22 minutes of non-face-to-face time during this encounter.  Hubbard Hartshorn, FNP

## 2019-09-07 ENCOUNTER — Telehealth: Payer: Self-pay | Admitting: Family Medicine

## 2019-09-07 NOTE — Telephone Encounter (Signed)
Urine is not dark today, Patient stated its back to normal and there is no pain. He will call office back if any issues.

## 2019-09-07 NOTE — Telephone Encounter (Addendum)
Please call to check in on patient - if still having dark urine this morning, needs to [facility] to [facility] this morning for labs ASAP.

## 2019-09-11 ENCOUNTER — Telehealth: Payer: Self-pay

## 2019-09-11 ENCOUNTER — Ambulatory Visit (INDEPENDENT_AMBULATORY_CARE_PROVIDER_SITE_OTHER): Payer: Medicare Other | Admitting: Otolaryngology

## 2019-09-11 NOTE — Telephone Encounter (Signed)
Called patient to do pre screening and he is under quarantine until jan 11th

## 2019-09-12 ENCOUNTER — Inpatient Hospital Stay: Payer: Medicare Other | Admitting: Internal Medicine

## 2019-09-12 ENCOUNTER — Inpatient Hospital Stay: Payer: Medicare Other

## 2019-09-12 ENCOUNTER — Inpatient Hospital Stay: Payer: Medicare Other | Admitting: Hospice and Palliative Medicine

## 2019-09-18 ENCOUNTER — Other Ambulatory Visit: Payer: Self-pay | Admitting: Internal Medicine

## 2019-09-18 DIAGNOSIS — C3412 Malignant neoplasm of upper lobe, left bronchus or lung: Secondary | ICD-10-CM

## 2019-09-18 DIAGNOSIS — I2699 Other pulmonary embolism without acute cor pulmonale: Secondary | ICD-10-CM

## 2019-09-19 ENCOUNTER — Inpatient Hospital Stay: Payer: Medicare Other | Attending: Internal Medicine

## 2019-09-19 ENCOUNTER — Inpatient Hospital Stay (HOSPITAL_BASED_OUTPATIENT_CLINIC_OR_DEPARTMENT_OTHER): Payer: Medicare Other | Admitting: Hospice and Palliative Medicine

## 2019-09-19 ENCOUNTER — Inpatient Hospital Stay: Payer: Medicare Other

## 2019-09-19 ENCOUNTER — Inpatient Hospital Stay (HOSPITAL_BASED_OUTPATIENT_CLINIC_OR_DEPARTMENT_OTHER): Payer: Medicare Other | Admitting: Internal Medicine

## 2019-09-19 ENCOUNTER — Other Ambulatory Visit: Payer: Self-pay

## 2019-09-19 VITALS — BP 129/85 | HR 93 | Resp 20

## 2019-09-19 DIAGNOSIS — Z8719 Personal history of other diseases of the digestive system: Secondary | ICD-10-CM | POA: Insufficient documentation

## 2019-09-19 DIAGNOSIS — R972 Elevated prostate specific antigen [PSA]: Secondary | ICD-10-CM | POA: Insufficient documentation

## 2019-09-19 DIAGNOSIS — C7931 Secondary malignant neoplasm of brain: Secondary | ICD-10-CM | POA: Insufficient documentation

## 2019-09-19 DIAGNOSIS — Z86718 Personal history of other venous thrombosis and embolism: Secondary | ICD-10-CM | POA: Diagnosis not present

## 2019-09-19 DIAGNOSIS — C3412 Malignant neoplasm of upper lobe, left bronchus or lung: Secondary | ICD-10-CM

## 2019-09-19 DIAGNOSIS — E785 Hyperlipidemia, unspecified: Secondary | ICD-10-CM | POA: Insufficient documentation

## 2019-09-19 DIAGNOSIS — N183 Chronic kidney disease, stage 3 unspecified: Secondary | ICD-10-CM | POA: Diagnosis not present

## 2019-09-19 DIAGNOSIS — Z86010 Personal history of colonic polyps: Secondary | ICD-10-CM | POA: Diagnosis not present

## 2019-09-19 DIAGNOSIS — R0602 Shortness of breath: Secondary | ICD-10-CM | POA: Diagnosis not present

## 2019-09-19 DIAGNOSIS — Z8673 Personal history of transient ischemic attack (TIA), and cerebral infarction without residual deficits: Secondary | ICD-10-CM | POA: Diagnosis not present

## 2019-09-19 DIAGNOSIS — Z8042 Family history of malignant neoplasm of prostate: Secondary | ICD-10-CM | POA: Insufficient documentation

## 2019-09-19 DIAGNOSIS — Z86711 Personal history of pulmonary embolism: Secondary | ICD-10-CM | POA: Insufficient documentation

## 2019-09-19 DIAGNOSIS — Z515 Encounter for palliative care: Secondary | ICD-10-CM | POA: Diagnosis not present

## 2019-09-19 DIAGNOSIS — R5383 Other fatigue: Secondary | ICD-10-CM | POA: Insufficient documentation

## 2019-09-19 DIAGNOSIS — Z833 Family history of diabetes mellitus: Secondary | ICD-10-CM | POA: Diagnosis not present

## 2019-09-19 DIAGNOSIS — Z79899 Other long term (current) drug therapy: Secondary | ICD-10-CM | POA: Diagnosis not present

## 2019-09-19 DIAGNOSIS — Z8249 Family history of ischemic heart disease and other diseases of the circulatory system: Secondary | ICD-10-CM | POA: Diagnosis not present

## 2019-09-19 DIAGNOSIS — Z818 Family history of other mental and behavioral disorders: Secondary | ICD-10-CM | POA: Diagnosis not present

## 2019-09-19 DIAGNOSIS — Z794 Long term (current) use of insulin: Secondary | ICD-10-CM | POA: Insufficient documentation

## 2019-09-19 DIAGNOSIS — E1122 Type 2 diabetes mellitus with diabetic chronic kidney disease: Secondary | ICD-10-CM | POA: Insufficient documentation

## 2019-09-19 DIAGNOSIS — Z7901 Long term (current) use of anticoagulants: Secondary | ICD-10-CM | POA: Insufficient documentation

## 2019-09-19 DIAGNOSIS — Z5112 Encounter for antineoplastic immunotherapy: Secondary | ICD-10-CM | POA: Insufficient documentation

## 2019-09-19 LAB — COMPREHENSIVE METABOLIC PANEL WITH GFR
ALT: 24 U/L (ref 0–44)
AST: 18 U/L (ref 15–41)
Albumin: 3.8 g/dL (ref 3.5–5.0)
Alkaline Phosphatase: 48 U/L (ref 38–126)
Anion gap: 12 (ref 5–15)
BUN: 10 mg/dL (ref 8–23)
CO2: 20 mmol/L — ABNORMAL LOW (ref 22–32)
Calcium: 9.3 mg/dL (ref 8.9–10.3)
Chloride: 105 mmol/L (ref 98–111)
Creatinine, Ser: 0.96 mg/dL (ref 0.61–1.24)
GFR calc Af Amer: >60 mL/min
GFR calc non Af Amer: >60 mL/min
Glucose, Bld: 139 mg/dL — ABNORMAL HIGH (ref 70–99)
Potassium: 3.3 mmol/L — ABNORMAL LOW (ref 3.5–5.1)
Sodium: 137 mmol/L (ref 135–145)
Total Bilirubin: 0.8 mg/dL (ref 0.3–1.2)
Total Protein: 7.2 g/dL (ref 6.5–8.1)

## 2019-09-19 LAB — CBC WITH DIFFERENTIAL/PLATELET
Abs Immature Granulocytes: 0.01 10*3/uL (ref 0.00–0.07)
Basophils Absolute: 0 10*3/uL (ref 0.0–0.1)
Basophils Relative: 0 %
Eosinophils Absolute: 0.2 10*3/uL (ref 0.0–0.5)
Eosinophils Relative: 4 %
HCT: 44 % (ref 39.0–52.0)
Hemoglobin: 14.5 g/dL (ref 13.0–17.0)
Immature Granulocytes: 0 %
Lymphocytes Relative: 25 %
Lymphs Abs: 1.2 10*3/uL (ref 0.7–4.0)
MCH: 29.2 pg (ref 26.0–34.0)
MCHC: 33 g/dL (ref 30.0–36.0)
MCV: 88.5 fL (ref 80.0–100.0)
Monocytes Absolute: 0.6 10*3/uL (ref 0.1–1.0)
Monocytes Relative: 13 %
Neutro Abs: 2.7 10*3/uL (ref 1.7–7.7)
Neutrophils Relative %: 58 %
Platelets: 253 10*3/uL (ref 150–400)
RBC: 4.97 MIL/uL (ref 4.22–5.81)
RDW: 14.8 % (ref 11.5–15.5)
WBC: 4.7 10*3/uL (ref 4.0–10.5)
nRBC: 0 % (ref 0.0–0.2)

## 2019-09-19 LAB — URINALYSIS, COMPLETE (UACMP) WITH MICROSCOPIC
Bacteria, UA: NONE SEEN
Bilirubin Urine: NEGATIVE
Glucose, UA: NEGATIVE mg/dL
Hgb urine dipstick: NEGATIVE
Ketones, ur: 20 mg/dL — AB
Nitrite: NEGATIVE
Protein, ur: 30 mg/dL — AB
Specific Gravity, Urine: 1.021 (ref 1.005–1.030)
pH: 5 (ref 5.0–8.0)

## 2019-09-19 MED ORDER — SODIUM CHLORIDE 0.9 % IV SOLN
10.5000 mg/kg | Freq: Once | INTRAVENOUS | Status: AC
  Administered 2019-09-19: 700 mg via INTRAVENOUS
  Filled 2019-09-19: qty 28

## 2019-09-19 MED ORDER — SODIUM CHLORIDE 0.9 % IV SOLN
Freq: Once | INTRAVENOUS | Status: AC
  Filled 2019-09-19: qty 250

## 2019-09-19 MED ORDER — SODIUM CHLORIDE 0.9 % IV SOLN: 800.0000 mg | Freq: Once | INTRAVENOUS | Status: DC

## 2019-09-19 NOTE — Progress Notes (Signed)
[place] OFFICE PROGRESS NOTE  Patient Care Team: [name], [name], MD as Medical Oncologist (Medical Oncology) Cathi Roan, [facility] (Pharmacist)  Cancer Staging No matching staging information was found for the patient.   Oncology History Overview Note  # OCT 2017- ADENO CA LUNG; STAGE IV [; LUL; bil supraclavicular LN; Left neck LN Bx]; ROS-1 MUTATED; s/p Carbo-alimta x1[oct 2017]  # NOV 1st 2017- XALKORI 250 mg BID; JAN 15th CT- PR;  # AUG 27th Chemo-RT to persistent LUL/mediastinal LN [s/p carbo-taxol- with RT; finished Sep 22nd 2018];   # July 14th 2020- multiple metastatic lesions of the brain [July 30 whole brain radiation finished April 21, 2019]; bone scan skull metastases /posterior left ninth rib metastases; April 04, 2019 stopped crizotinib;  #April 25, 2019-start Entrectinib 600 mg once a day; STopped in NOV 2020 [dizziness]; NOV 2020-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        INTERVAL HISTORY:  Melvin Willis 69 y.o.  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  In the interim patient was diagnosed with Covid infection-symptoms resolved except for mild fatigue.  He did not need to admit to hospital.  Patient lost significant weight-however his appetite is coming back.  Complains of mild shortness of breath on exertion.  Mild fatigue.  Denies any headaches.  Denies any worsening dizziness.  Review of Systems  Constitutional: Positive for malaise/fatigue. Negative for chills, diaphoresis, fever and weight loss.  HENT: Positive for hearing loss. Negative for nosebleeds and sore throat.   Eyes: Negative for double vision.  Respiratory: Negative for cough, hemoptysis, sputum production, shortness of breath and wheezing.   Cardiovascular: Negative for chest pain (Left upper chest wall pain chronic), palpitations and orthopnea.  Gastrointestinal: Negative for abdominal pain, blood in stool, constipation, diarrhea, heartburn and melena.  Genitourinary: Negative for dysuria.  Musculoskeletal: Positive for joint pain. Negative for back pain.  Skin: Negative.  Negative for itching and rash.  Neurological: Positive for dizziness. Negative for tingling, focal weakness and weakness.  Endo/Heme/Allergies: Does not bruise/bleed easily.  Psychiatric/Behavioral: Positive for memory loss. Negative for depression. The patient is not nervous/anxious and does not have insomnia.     PAST MEDICAL HISTORY :  Past Medical History:  Diagnosis Date  . Abnormal prostate specific antigen 08/08/2012  . Adiposity 04/16/2015  . Chronic kidney disease (CKD), stage III (moderate) 11/27/2016  .  CVA (cerebral vascular accident) ([place])  06/17/2016  . Diabetes mellitus without complication ([place])   . Diverticulosis of sigmoid colon 04/16/2015  . Dyslipidemia 03/18/2015  . Hemorrhoids, internal 04/16/2015  . Hypercholesteremia 04/16/2015  . Hyperlipidemia   . Hypertension   . Primary cancer of left upper lobe of lung ([place])   . Pulmonary embolism ([place])   . Wears dentures    partial upper    PAST SURGICAL HISTORY :   Past Surgical History:  Procedure Laterality Date  . COLONOSCOPY    . COLONOSCOPY WITH PROPOFOL N/A 05/06/2015   Procedure: COLONOSCOPY WITH PROPOFOL;  Surgeon: Lucilla Lame, MD;  Location: [place];  Service: Endoscopy;  Laterality: N/A;  ASCENDING COLON POLYPS X 2 TERMINAL ILEUM BIOPSY RANDOM COLON BX. TRANSVERSE COLON POLYP SIGMOID COLON POLYP  . ESOPHAGOGASTRODUODENOSCOPY (EGD) WITH PROPOFOL N/A 05/06/2015   Procedure: ESOPHAGOGASTRODUODENOSCOPY (EGD) WITH PROPOFOL;  Surgeon: Lucilla Lame, MD;  Location: [place];  Service: Endoscopy;  Laterality: N/A;  GASTRIC BIOPSY X1  . KIDNEY STONE SURGERY  2017    FAMILY HISTORY :   Family History  Problem Relation Age of Onset  . Diabetes Mother   . Diabetes Father   . CAD Father   . Dementia Father   . Diabetes Sister   . Cancer Maternal Uncle        Prostate  . Cancer Cousin        prostate    SOCIAL HISTORY:   Social History   Tobacco Use  . Smoking status: Never Smoker  . Smokeless tobacco: Never Used  Substance Use Topics  . Alcohol use: No    Alcohol/week: 0.0 standard drinks  . Drug use: No    ALLERGIES:  has No Known Allergies.  MEDICATIONS:  Current Outpatient Medications  Medication Sig Dispense Refill  . atorvastatin (LIPITOR) 40 MG tablet Take 1 tablet (40 mg total) by mouth 3 (three) times a week. 48 tablet 1  . blood glucose meter kit and supplies Dispense based on patient and insurance preference (Accuchek Aviva Plus, Accuchek Nano). Use up to four times daily as directed. (FOR ICD-10 E10.9, E11.9). 1 each 0   . entrectinib (ROZLYTREK) 200 MG capsule Take 3 capsules (600 mg total) by mouth daily. 90 capsule 6  . guaiFENesin (MUCINEX) 600 MG 12 hr tablet Take 1 tablet (600 mg total) by mouth 2 (two) times daily. 20 tablet 0  . insulin degludec (TRESIBA FLEXTOUCH) 100 UNIT/ML SOPN FlexTouch Pen Inject 0.1 mLs (10 Units total) into the skin daily. 15 mL 0  . metFORMIN (GLUCOPHAGE) 500 MG tablet Take 1 tablet (500 mg total) by mouth 3 (three) times daily. 270 tablet 0  . ondansetron (ZOFRAN) 8 MG tablet One pill every 8 hours as needed for nausea/vomitting. 40 tablet 1  . tamsulosin (FLOMAX) 0.4 MG CAPS capsule Take 1 capsule (0.4 mg total) by mouth every morning. 90 capsule 3  . XARELTO 20 MG TABS tablet TAKE 1 TABLET BY MOUTH ONCE DAILY WITH SUPPER 30 tablet 0  . fluticasone (FLONASE) 50 MCG/ACT nasal spray Place 2 sprays into both nostrils daily. (Patient not taking: Reported on 09/19/2019) 16 g 2   No current facility-administered medications for this visit.   Facility-Administered Medications Ordered in Other Visits  Medication Dose Route Frequency Provider Last Rate Last Admin  . bevacizumab-awwb (MVASI) 700 mg in sodium chloride 0.9 % 100 mL chemo infusion  10.5 mg/kg (Order-Specific) Intravenous Once Cammie Sickle, MD  PHYSICAL EXAMINATION: ECOG PERFORMANCE STATUS: 0 - Asymptomatic  BP (!) 147/84 (BP Location: Left Arm, Patient Position: Sitting, Cuff Size: Normal)   Pulse (!) 103   Temp (!) 97.2 F (36.2 C) (Tympanic)   Wt 147 lb (66.7 kg)   BMI 25.23 kg/m   Filed Weights   09/19/19 1057  Weight: 147 lb (66.7 kg)    Physical Exam  Constitutional: He is oriented to person, place, and time and well-developed, well-nourished, and in no distress.  Is alone.  He is walking by himself.  HENT:  Head: Normocephalic and atraumatic.  Mouth/Throat: Oropharynx is clear and moist. No oropharyngeal exudate.  Eyes: Pupils are equal, round, and reactive to light.  Cardiovascular:  Normal rate and regular rhythm.  Pulmonary/Chest: No respiratory distress. He has no wheezes.  Abdominal: Soft. Bowel sounds are normal. He exhibits no distension and no mass. There is no abdominal tenderness. There is no rebound and no guarding.  Musculoskeletal:        General: No tenderness or edema. Normal range of motion.     Cervical back: Normal range of motion and neck supple.  Neurological: He is alert and oriented to person, place, and time.  Skin: Skin is warm.  Psychiatric: Affect normal.   LABORATORY DATA:  I have reviewed the data as listed    Component Value Date/Time   NA 137 09/19/2019 1037   NA 139 01/01/2016 1000   K 3.3 (L) 09/19/2019 1037   CL 105 09/19/2019 1037   CO2 20 (L) 09/19/2019 1037   GLUCOSE 139 (H) 09/19/2019 1037   BUN 10 09/19/2019 1037   BUN 13 01/01/2016 1000   CREATININE 0.96 09/19/2019 1037   CREATININE 1.32 (H) 08/22/2018 1107   CALCIUM 9.3 09/19/2019 1037   PROT 7.2 09/19/2019 1037   PROT 7.2 01/01/2016 1000   ALBUMIN 3.8 09/19/2019 1037   ALBUMIN 4.4 01/01/2016 1000   AST 18 09/19/2019 1037   ALT 24 09/19/2019 1037   ALKPHOS 48 09/19/2019 1037   BILITOT 0.8 09/19/2019 1037   BILITOT 0.4 01/01/2016 1000   GFRNONAA >60 09/19/2019 1037   GFRNONAA 56 (L) 08/22/2018 1107   GFRAA >60 09/19/2019 1037   GFRAA 65 08/22/2018 1107    No results found for: SPEP, UPEP  Lab Results  Component Value Date   WBC 4.7 09/19/2019   NEUTROABS 2.7 09/19/2019   HGB 14.5 09/19/2019   HCT 44.0 09/19/2019   MCV 88.5 09/19/2019   PLT 253 09/19/2019      Chemistry      Component Value Date/Time   NA 137 09/19/2019 1037   NA 139 01/01/2016 1000   K 3.3 (L) 09/19/2019 1037   CL 105 09/19/2019 1037   CO2 20 (L) 09/19/2019 1037   BUN 10 09/19/2019 1037   BUN 13 01/01/2016 1000   CREATININE 0.96 09/19/2019 1037   CREATININE 1.32 (H) 08/22/2018 1107      Component Value Date/Time   CALCIUM 9.3 09/19/2019 1037   ALKPHOS 48 09/19/2019 1037    AST 18 09/19/2019 1037   ALT 24 09/19/2019 1037   BILITOT 0.8 09/19/2019 1037   BILITOT 0.4 01/01/2016 1000       RADIOGRAPHIC STUDIES: I have personally reviewed the radiological images as listed and agreed with the findings in the report. No results found.   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 NOV 2020- MRI brain-overall improved except increasing left temporal lesion [see discussion below] patient currently OFF Rozyltrek 2pills sec to dizzyness Currently on avastin every 2 weeks. STABLE.    # Proceed with avastin cycle #3 today. Labs today reviewed;  acceptable for treatment today. Continue to HOLD Entrectinib for now; will restart after avastin is done.  We will plan to repeat brain imaging after 4-6 cycles.  # weight loss- ? Sec to recent COVID; improving.   # Difficulty hearing-unclear etiology Stable.await ENT evaluation on 1/13.   # Dizziness-unclear etiology-question brain metastases versus entrectinib [stopped dec1st 8706]- improved.  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    # DISPOSITION: # avastin today # follow up in 2 weeks-MD;labs- cbc/cmp/UA avastin- Dr.B    No orders of the defined types were placed in this encounter.  All questions were answered. The patient knows to call the clinic with any problems, questions or concerns.      Cammie Sickle, MD 09/19/2019 12:35 PM

## 2019-09-19 NOTE — Assessment & Plan Note (Addendum)
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 NOV 2020- MRI brain-overall improved except increasing left temporal lesion [see discussion below] patient currently OFF Rozyltrek 2pills sec to dizzyness Currently on avastin every 2 weeks. STABLE.    # Proceed with avastin cycle #3 today. Labs today reviewed;  acceptable for treatment today. Continue to HOLD Entrectinib for now; will restart after avastin is done.  We will plan to repeat brain imaging after 4-6 cycles.  # weight loss- ? Sec to recent COVID; improving.   # Difficulty hearing-unclear etiology Stable.await ENT evaluation on 1/13.   # Dizziness-unclear etiology-question brain metastases versus entrectinib [stopped dec1st 0029]- improved.  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    # DISPOSITION: # avastin today # follow up in 2 weeks-MD;labs- cbc/cmp/UA avastin- Dr.B

## 2019-09-19 NOTE — Progress Notes (Signed)
[place]  Telephone:([phone] [phone]   Name: BOEN STERBENZ Date: 09/19/2019 MRN: [id number]  DOB: 18-May-1951  Patient Care Team: [name], [name], MD as Medical Oncologist (Medical Oncology) Cathi Roan, [facility] (Pharmacist)    REASON FOR CONSULTATION: Melvin Willis is a 69 y.o. 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  Patient is status post whole brain radiation and is currently on treatment with Avastin.  MRI of the brain on 07/25/2019 revealed progression of enhancing lesion in the left temporal lobe concerning for either radiation necrosis or progressive metastatic disease.  Patient was referred to palliative care to help address goals and manage ongoing symptoms.   SOCIAL HISTORY:     reports that he has never smoked. He has never used smokeless tobacco. He reports that he does not drink alcohol or use drugs.   Patient is married and lives at home with his wife.  He has 2 sons and a daughter who are involved in his care.  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  ADVANCE DIRECTIVES:  Does not have  CODE STATUS:   PAST MEDICAL HISTORY: Past Medical History:  Diagnosis Date   Abnormal prostate specific antigen 08/08/2012   Adiposity 04/16/2015   Chronic kidney disease (CKD), stage III (moderate) 11/27/2016   CVA (cerebral vascular accident) ([place]) 06/17/2016   Diabetes mellitus without complication ([place])    Diverticulosis of sigmoid colon 04/16/2015   Dyslipidemia 03/18/2015   Hemorrhoids, internal 04/16/2015   Hypercholesteremia 04/16/2015   Hyperlipidemia    Hypertension    Primary cancer of left upper lobe of lung ([place])    Pulmonary embolism ([place])    Wears dentures    partial upper    PAST SURGICAL HISTORY:  Past Surgical History:  Procedure  Laterality Date   COLONOSCOPY     COLONOSCOPY WITH PROPOFOL N/A 05/06/2015   Procedure: COLONOSCOPY WITH PROPOFOL;  Surgeon: Lucilla Lame, MD;  Location: [place];  Service: Endoscopy;  Laterality: N/A;  ASCENDING COLON POLYPS X 2 TERMINAL ILEUM BIOPSY RANDOM COLON BX. TRANSVERSE COLON POLYP SIGMOID COLON POLYP   ESOPHAGOGASTRODUODENOSCOPY (EGD) WITH PROPOFOL N/A 05/06/2015   Procedure: ESOPHAGOGASTRODUODENOSCOPY (EGD) WITH PROPOFOL;  Surgeon: Lucilla Lame, MD;  Location: [place];  Service: Endoscopy;  Laterality: N/A;  GASTRIC BIOPSY X1   KIDNEY STONE SURGERY  2017    HEMATOLOGY/ONCOLOGY HISTORY:  Oncology History Overview Note  # OCT 2017- ADENO CA LUNG; STAGE IV [; LUL; bil supraclavicular LN; Left neck LN Bx]; ROS-1 MUTATED; s/p Carbo-alimta x1[oct 2017]  # NOV 1st 2017- XALKORI 250 mg BID; JAN 15th CT- PR;  # AUG 27th Chemo-RT to persistent LUL/mediastinal LN [s/p carbo-taxol- with RT; finished Sep 22nd 2018];   # July 14th 2020- multiple metastatic lesions of the brain [July 30 whole brain radiation finished April 21, 2019]; bone scan skull metastases /posterior left ninth rib metastases; April 04, 2019 stopped crizotinib;  #April 25, 2019-start Entrectinib 600 mg once a day; STopped in NOV 2020 [dizziness]; NOV 2020-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 On xarelto]-Lovenox; Jan mid 2018- xarelto [lovenox-insurance issues]  # MRI brain- multiple infarcts [2d echo/bubble study-NEG's/p Neurology eval]  ------------------------------------------------------------    # # s/p TURP [Sep 2017; Dr.Cope] DEC 26th CT- distended  bladder   # MOLECULAR TESTING- ROS-1 POSITIVE; ALK/EGFR-NEG; PDL-1 EXPRESSION- 90%** [HIGH]  # DEC 15th 2020- PALLIATIVE CARE -----------------------------------------------------------------    DIAGNOSIS: Adenocarcinoma the lung  ROS-1 +  STAGE:   IV    ;GOALS: Palliative  CURRENT/MOST RECENT THERAPY- Avastin [C]   Primary cancer of left upper lobe of lung ([place])  08/09/2019 -  Chemotherapy   The patient had bevacizumab-awwb (MVASI) 800 mg in sodium chloride 0.9 % 100 mL chemo infusion, 725 mg, Intravenous,  Once, 3 of 6 cycles Dose modification: 10 mg/kg (original dose 10 mg/kg, Cycle 3, Reason: Other (see comments), Comment: weight change) Administration: 800 mg (08/09/2019), 800 mg (08/22/2019), 700 mg (09/19/2019)  for chemotherapy treatment.      ALLERGIES:  has No Known Allergies.  MEDICATIONS:  Current Outpatient Medications  Medication Sig Dispense Refill   atorvastatin (LIPITOR) 40 MG tablet Take 1 tablet (40 mg total) by mouth 3 (three) times a week. 48 tablet 1   blood glucose meter kit and supplies Dispense based on patient and insurance preference (Accuchek Aviva Plus, Accuchek Nano). Use up to four times daily as directed. (FOR ICD-10 E10.9, E11.9). 1 each 0   entrectinib (ROZLYTREK) 200 MG capsule Take 3 capsules (600 mg total) by mouth daily. 90 capsule 6   fluticasone (FLONASE) 50 MCG/ACT nasal spray Place 2 sprays into both nostrils daily. (Patient not taking: Reported on 09/19/2019) 16 g 2   guaiFENesin (MUCINEX) 600 MG 12 hr tablet Take 1 tablet (600 mg total) by mouth 2 (two) times daily. 20 tablet 0   insulin degludec (TRESIBA FLEXTOUCH) 100 UNIT/ML SOPN FlexTouch Pen Inject 0.1 mLs (10 Units total) into the skin daily. 15 mL 0   metFORMIN (GLUCOPHAGE) 500 MG tablet Take 1 tablet (500 mg total) by mouth 3 (three) times daily. 270 tablet 0   ondansetron (ZOFRAN) 8 MG tablet One pill every 8 hours as needed for nausea/vomitting. 40 tablet 1   tamsulosin (FLOMAX) 0.4 MG CAPS capsule Take 1 capsule (0.4 mg total) by mouth every morning. 90 capsule 3   XARELTO 20 MG TABS tablet TAKE 1 TABLET BY MOUTH ONCE DAILY WITH SUPPER 30 tablet 0   No current facility-administered medications for this visit.    VITAL  SIGNS: There were no vitals taken for this visit. There were no vitals filed for this visit.  Estimated body mass index is 25.23 kg/m as calculated from the following:   Height as of 09/02/19: '5\' 4"'  (1.626 m).   Weight as of an earlier encounter on 09/19/19: 147 lb (66.7 kg).  LABS: CBC:    Component Value Date/Time   WBC 4.7 09/19/2019 1037   HGB 14.5 09/19/2019 1037   HGB 14.1 01/01/2016 1000   HCT 44.0 09/19/2019 1037   HCT 42.7 01/01/2016 1000   PLT 253 09/19/2019 1037   PLT 307 01/01/2016 1000   MCV 88.5 09/19/2019 1037   MCV 86 01/01/2016 1000   NEUTROABS 2.7 09/19/2019 1037   NEUTROABS 4.6 01/01/2016 1000   LYMPHSABS 1.2 09/19/2019 1037   LYMPHSABS 1.6 01/01/2016 1000   MONOABS 0.6 09/19/2019 1037   EOSABS 0.2 09/19/2019 1037   EOSABS 0.3 01/01/2016 1000   BASOSABS 0.0 09/19/2019 1037   BASOSABS 0.0 01/01/2016 1000   Comprehensive Metabolic Panel:    Component Value Date/Time   NA 137 09/19/2019 1037   NA 139 01/01/2016 1000   K 3.3 (L) 09/19/2019 1037   CL 105 09/19/2019 1037   CO2 20 (L) 09/19/2019  1037   BUN 10 09/19/2019 1037   BUN 13 01/01/2016 1000   CREATININE 0.96 09/19/2019 1037   CREATININE 1.32 (H) 08/22/2018 1107   GLUCOSE 139 (H) 09/19/2019 1037   CALCIUM 9.3 09/19/2019 1037   AST 18 09/19/2019 1037   ALT 24 09/19/2019 1037   ALKPHOS 48 09/19/2019 1037   BILITOT 0.8 09/19/2019 1037   BILITOT 0.4 01/01/2016 1000   PROT 7.2 09/19/2019 1037   PROT 7.2 01/01/2016 1000   ALBUMIN 3.8 09/19/2019 1037   ALBUMIN 4.4 01/01/2016 1000    RADIOGRAPHIC STUDIES: CT Abdomen Pelvis W Contrast  Result Date: 09/02/2019 CLINICAL DATA:  Lower abdominal pain, history of COVID-19, metastatic lung cancer to brain EXAM: CT ABDOMEN AND PELVIS WITH CONTRAST TECHNIQUE: Multidetector CT imaging of the abdomen and pelvis was performed using the standard protocol following bolus administration of intravenous contrast. Sagittal and coronal MPR images reconstructed from  axial data set. CONTRAST:  134m OMNIPAQUE IOHEXOL 300 MG/ML SOLN IV. No oral contrast. COMPARISON:  07/25/2019 FINDINGS: Lower chest: Mild patchy airspace infiltrates at RIGHT lung base consistent with pneumonia. Hepatobiliary: Multiple hepatic cysts, largest a bilobed cyst with a thin septation at the lateral segment LEFT lobe 4.0 x 3.1 cm. Gallbladder unremarkable. Pancreas: Normal appearance Spleen: Normal appearance Adrenals/Urinary Tract: Adrenal glands normal appearance. Small LEFT renal cyst 2.0 x 1.8 cm at mid upper pole. Kidneys and ureters normal appearance. Significant indentation and elevation of the bladder base by a marked prostatic enlargement. Bladder otherwise unremarkable., Stomach/Bowel: Normal appendix. Diverticulosis of descending and sigmoid colon with wall thickening and pericolic infiltrative changes at the descending/sigmoid junction consistent with acute diverticulitis. No extraluminal gas or evidence of abscess. Stomach decompressed. Duodenal diverticulum. Remaining bowel loops unremarkable. Vascular/Lymphatic: Atherosclerotic calcifications aorta without aneurysm. Reproductive: Marked prostatic enlargement, gland measuring 7.6 x 6.9 x 8.0 cm (volume = 220 cm^3). Lobulated extension of prostate gland into bladder base posteriorly. Other: No free air or free fluid. Small umbilical hernia containing fat. Musculoskeletal: Degenerative disc and facet disease changes L5-S1. Bone destruction at superior endplate of L5 likely reflects a Schmorl's node. IMPRESSION: Acute diverticulitis at the descending sigmoid junction without evidence of extraluminal gas or abscess. Marked prostatic enlargement, prostate gland 7.6 x 6.9 x 8.0 cm extending into bladder base. Hepatic and LEFT renal cysts. Patchy RIGHT basilar pulmonary infiltrate question pneumonia. Small umbilical hernia containing fat. Aortic Atherosclerosis (ICD10-I70.0). Electronically Signed   By: MLavonia DanaM.D.   On: 09/02/2019 18:01     PERFORMANCE STATUS (ECOG) : 1 - Symptomatic but completely ambulatory  Review of Systems Unless otherwise noted, a complete review of systems is negative.  Physical Exam General: NAD, frail appearing, thin Pulmonary: Unlabored Extremities: no edema, no joint deformities Skin: no rashes Neurological: Weakness but otherwise nonfocal  IMPRESSION: Routine follow-up visit today.  Patient was seen in the infusion area.  Patient reports he is doing reasonably well.  He denies any significant changes or concerns.  No distressing symptoms reported.  No adverse effects or issues with his medications.  He feels that his oral intake has improved.  However, I note that his weight is continue to downtrend.  Patient's weight today was 147 pounds and was 154 pounds 2 weeks ago.  Performance status is stable.  Patient says that he and his wife have briefly discussed ACP/MOST but have not arrived today in definitive decisions.  PLAN: -Continue current scope of treatment -Increase oral supplements to 3 times daily -High calorie/protein diet -Can consider appetite stimulants  if continued weight loss -RD is following -ACP/MOST Form previously reviewed -Follow-up telephone visit about a month   Patient expressed understanding and was in agreement with this plan. He also understands that He can call the clinic at any time with any questions, concerns, or complaints.     Time Total: 15 minutes  Visit consisted of counseling and education dealing with the complex and emotionally intense issues of symptom management and palliative care in the setting of serious and potentially life-threatening illness.Greater than 50%  of this time was spent counseling and coordinating care related to the above assessment and plan.  Signed by: Altha Harm, PhD, NP-C

## 2019-09-20 ENCOUNTER — Ambulatory Visit (INDEPENDENT_AMBULATORY_CARE_PROVIDER_SITE_OTHER): Payer: Medicare Other | Admitting: Otolaryngology

## 2019-09-25 ENCOUNTER — Encounter (INDEPENDENT_AMBULATORY_CARE_PROVIDER_SITE_OTHER): Payer: Self-pay | Admitting: Otolaryngology

## 2019-09-25 ENCOUNTER — Other Ambulatory Visit: Payer: Self-pay

## 2019-09-25 ENCOUNTER — Ambulatory Visit (INDEPENDENT_AMBULATORY_CARE_PROVIDER_SITE_OTHER): Payer: Medicare Other | Admitting: Otolaryngology

## 2019-09-25 VITALS — Temp 98.4°F

## 2019-09-25 DIAGNOSIS — H903 Sensorineural hearing loss, bilateral: Secondary | ICD-10-CM

## 2019-09-25 DIAGNOSIS — E785 Hyperlipidemia, unspecified: Secondary | ICD-10-CM

## 2019-09-25 DIAGNOSIS — C7931 Secondary malignant neoplasm of brain: Secondary | ICD-10-CM | POA: Diagnosis not present

## 2019-09-25 DIAGNOSIS — H6523 Chronic serous otitis media, bilateral: Secondary | ICD-10-CM

## 2019-09-25 DIAGNOSIS — H906 Mixed conductive and sensorineural hearing loss, bilateral: Secondary | ICD-10-CM | POA: Diagnosis not present

## 2019-09-25 DIAGNOSIS — E1169 Type 2 diabetes mellitus with other specified complication: Secondary | ICD-10-CM | POA: Diagnosis not present

## 2019-09-25 DIAGNOSIS — I82502 Chronic embolism and thrombosis of unspecified deep veins of left lower extremity: Secondary | ICD-10-CM | POA: Diagnosis not present

## 2019-09-25 NOTE — Progress Notes (Signed)
HPI: Melvin Willis is a 69 y.o. male who presents is referred by PCP for evaluation of decreased hearing he has had for several months.  He has metastatic lung cancer and has received radiation therapy to brain mets.  He complains of blockage of his hearing..  Past Medical History:  Diagnosis Date  . Abnormal prostate specific antigen 08/08/2012  . Adiposity 04/16/2015  . Chronic kidney disease (CKD), stage III (moderate) 11/27/2016  . CVA (cerebral vascular accident) ([place]) 06/17/2016  . Diabetes mellitus without complication ([place])   . Diverticulosis of sigmoid colon 04/16/2015  . Dyslipidemia 03/18/2015  . Hemorrhoids, internal 04/16/2015  . Hypercholesteremia 04/16/2015  . Hyperlipidemia   . Hypertension   . Primary cancer of left upper lobe of lung ([place])   . Pulmonary embolism ([place])   . Wears dentures    partial upper   Past Surgical History:  Procedure Laterality Date  . COLONOSCOPY    . COLONOSCOPY WITH PROPOFOL N/A 05/06/2015   Procedure: COLONOSCOPY WITH PROPOFOL;  Surgeon: Lucilla Lame, MD;  Location: [facility];  Service: Endoscopy;  Laterality: N/A;  ASCENDING COLON POLYPS X 2 TERMINAL ILEUM BIOPSY RANDOM COLON BX. TRANSVERSE COLON POLYP SIGMOID COLON POLYP  . ESOPHAGOGASTRODUODENOSCOPY (EGD) WITH PROPOFOL N/A 05/06/2015   Procedure: ESOPHAGOGASTRODUODENOSCOPY (EGD) WITH PROPOFOL;  Surgeon: Lucilla Lame, MD;  Location: [facility];  Service: Endoscopy;  Laterality: N/A;  GASTRIC BIOPSY X1  . KIDNEY STONE SURGERY  2017   Social History   Socioeconomic History  . Marital status: Married    Spouse name: Melissa   . Number of children: 3  . Years of education: Not on file  . Highest education level: Not on file  Occupational History  . Occupation: disable     Comment: from CVA and lung cancer  Tobacco Use  . Smoking status: Never Smoker  . Smokeless tobacco: Never Used  Substance and Sexual Activity  . Alcohol use: No    Alcohol/week: 0.0 standard  drinks  . Drug use: No  . Sexual activity: Yes    Partners: Female  Other Topics Concern  . Not on file  Social History Narrative   Used to work until 2 years ago when got sick with DVT leg , PE , CVA and found out he had lung cancer. He has medicare now    Social Determinants of Radio broadcast assistant Strain:   . Difficulty of Paying Living Expenses: Not on file  Food Insecurity:   . Worried About Charity fundraiser in the Last Year: Not on file  . Ran Out of Food in the Last Year: Not on file  Transportation Needs:   . Lack of Transportation (Medical): Not on file  . Lack of Transportation (Non-Medical): Not on file  Physical Activity: Insufficiently Active  . Days of Exercise per Week: 3 days  . Minutes of Exercise per Session: 30 min  Stress:   . Feeling of Stress : Not on file  Social Connections:   . Frequency of Communication with Friends and Family: Not on file  . Frequency of Social Gatherings with Friends and Family: Not on file  . Attends Religious Services: Not on file  . Active Member of Clubs or Organizations: Not on file  . Attends Archivist Meetings: Not on file  . Marital Status: Not on file   Family History  Problem Relation Age of Onset  . Diabetes Mother   . Diabetes Father   . CAD  Father   . Dementia Father   . Diabetes Sister   . Cancer Maternal Uncle        Prostate  . Cancer Cousin        prostate   No Known Allergies Prior to Admission medications   Medication Sig Start Date End Date Taking? Authorizing Provider  atorvastatin (LIPITOR) 40 MG tablet Take 1 tablet (40 mg total) by mouth 3 (three) times a week. 12/30/18  Yes [name], MD  blood glucose meter kit and supplies Dispense based on patient and insurance preference (Accuchek Aviva Plus, Accuchek Nano). Use up to four times daily as directed. (FOR ICD-10 E10.9, E11.9). 05/08/19  Yes Sowles, Drue Stager, MD  entrectinib (ROZLYTREK) 200 MG capsule Take 3 capsules (600 mg  total) by mouth daily. 03/22/19  Yes Cammie Sickle, MD  fluticasone (FLONASE) 50 MCG/ACT nasal spray Place 2 sprays into both nostrils daily. 08/07/19  Yes Sowles, Drue Stager, MD  guaiFENesin (MUCINEX) 600 MG 12 hr tablet Take 1 tablet (600 mg total) by mouth 2 (two) times daily. 09/06/19  Yes Hubbard Hartshorn, FNP  insulin degludec (TRESIBA FLEXTOUCH) 100 UNIT/ML SOPN FlexTouch Pen Inject 0.1 mLs (10 Units total) into the skin daily. 09/06/19  Yes Hubbard Hartshorn, FNP  metFORMIN (GLUCOPHAGE) 500 MG tablet Take 1 tablet (500 mg total) by mouth 3 (three) times daily. 08/07/19  Yes Sowles, Drue Stager, MD  ondansetron (ZOFRAN) 8 MG tablet One pill every 8 hours as needed for nausea/vomitting. 03/22/19  Yes Cammie Sickle, MD  tamsulosin (FLOMAX) 0.4 MG CAPS capsule Take 1 capsule (0.4 mg total) by mouth every morning. 09/30/18  Yes Stoioff, Ronda Fairly, MD  XARELTO 20 MG TABS tablet TAKE 1 TABLET BY MOUTH ONCE DAILY WITH SUPPER 09/19/19  Yes Cammie Sickle, MD     Positive ROS: Otherwise negative  All other systems have been reviewed and were otherwise negative with the exception of those mentioned in the HPI and as above.  Physical Exam: Constitutional: Alert, well-appearing, no acute distress Ears: External ears without lesions or tenderness. Ear canals are clear bilaterally.  He has a bilateral serous otitis media.  He is able to insufflate some air behind his TMs himself with slight improvement of his hearing. Nasal: External nose without lesions. Septum with minimal deformity.. Clear nasal passages.  Mild rhinitis.  No signs of infection.  Nasopharynx was clear. Oral: Lips and gums without lesions. Tongue and palate mucosa without lesions. Posterior oropharynx clear. Neck: No palpable adenopathy or masses Respiratory: Breathing comfortably  Skin: No facial/neck lesions or rash noted.  Audiogram demonstrated bilateral type B tympanograms with a small conductive loss mostly on the right  side.  SRT's were 40 dB on the right and 25 dB on the left.  He has a mild downsloping SNHL in both ears. 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  Plan: Recommended regular use of nasal steroid spray Nasacort or Flonase every night.  Also demonstrated and discussed with him concerning popping his ears every day to try to help resolve the serous otitis media.  He will follow-up in 3 weeks for recheck.  If the serous otitis media persists consider placement of BMTs in the office.   Radene Journey, MD   CC:

## 2019-09-27 ENCOUNTER — Telehealth: Payer: Self-pay | Admitting: *Deleted

## 2019-09-27 NOTE — Telephone Encounter (Signed)
Patient called. Patient has an upcoming apt with urology on 2/5. He wanted to know if he should keep this apt given his metastatic disease. He has not had an MR prostate or any prostate biopsy at this time. I gave him the urology's office phone number to contact that dept to discuss this further.  Will CC: Dr. Bernardo Heater on this msg.

## 2019-09-28 ENCOUNTER — Ambulatory Visit: Payer: Medicare Other | Admitting: Urology

## 2019-09-28 ENCOUNTER — Encounter (INDEPENDENT_AMBULATORY_CARE_PROVIDER_SITE_OTHER): Payer: Self-pay

## 2019-09-29 ENCOUNTER — Telehealth: Payer: Self-pay | Admitting: *Deleted

## 2019-09-29 NOTE — Telephone Encounter (Signed)
Contacted [employer]. Information for tier exception given to representative. However, the insurance needs clinical documentation that patient has been on xarelto since 2017 and signed md statement. Will fax form.

## 2019-09-29 NOTE — Telephone Encounter (Signed)
App has been changed I will call the patient.  Thanks, [name]

## 2019-09-29 NOTE — Telephone Encounter (Signed)
-----   Message from Secretary sent at 09/28/2019  4:55 PM EST ----- Regarding: Xarelto [name]. From insurance company is calling regarding information needed for his Xarelto.  Would you please call h im at [phone].  Call was received at 4:54 from him on 09/27/2019.  Thanks, Aleen Sells

## 2019-09-29 NOTE — Telephone Encounter (Signed)
Can set up a virtual visit or change the 2/5 appointment to virtual to discuss further

## 2019-10-02 ENCOUNTER — Inpatient Hospital Stay: Payer: Medicare Other

## 2019-10-02 NOTE — Progress Notes (Signed)
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  Patient followed by Dr. B and receiving avastin.   Spoke with patient via phone for nutrition follow-up.  Patient reports that appetite is a little bit better.  Reports that he eats about 2 meals per day (breakfast and supper).  Breakfast is usually eggs, bacon, toast, jelly (~9a-10am).  Supper is usually meat and vegetables (~ 6pm).  Does not like ensure/boost shakes.    Denies nausea.  Reports some loose stool but better and none today.   Medications: reviewed  Labs: reviewed Covid +  Anthropometrics:   Weight decreased to 147 lb from 154 lb.    NUTRITION DIAGNOSIS: Inadequate oral intake continues   INTERVENTION:  Encouraged patient to add snack at mid-day for additional calories and protein. We discussed options that he likes.  Patient has contact information    MONITORING, EVALUATION, GOAL: Patient will consume adequate calories and protein to prevent weight loss   NEXT VISIT: phone f/u, March 1  Melvin Willis B. Zenia Resides, [place], [place] Registered Dietitian [phone] (pager)

## 2019-10-03 ENCOUNTER — Other Ambulatory Visit: Payer: Self-pay

## 2019-10-03 NOTE — Progress Notes (Signed)
Patient pre screened for office appointment, no questions or concerns today. Patient reminded of upcoming appointment time and date. Patient states that he has been exposed to [place] in the past month, he has not been tested and is asymptomatic. Care team notified.

## 2019-10-04 ENCOUNTER — Inpatient Hospital Stay: Payer: Medicare Other

## 2019-10-04 ENCOUNTER — Other Ambulatory Visit: Payer: Self-pay

## 2019-10-04 ENCOUNTER — Inpatient Hospital Stay (HOSPITAL_BASED_OUTPATIENT_CLINIC_OR_DEPARTMENT_OTHER): Payer: Medicare Other | Admitting: Internal Medicine

## 2019-10-04 ENCOUNTER — Other Ambulatory Visit: Payer: Self-pay | Admitting: *Deleted

## 2019-10-04 DIAGNOSIS — R0602 Shortness of breath: Secondary | ICD-10-CM | POA: Diagnosis not present

## 2019-10-04 DIAGNOSIS — C7931 Secondary malignant neoplasm of brain: Secondary | ICD-10-CM | POA: Diagnosis not present

## 2019-10-04 DIAGNOSIS — R972 Elevated prostate specific antigen [PSA]: Secondary | ICD-10-CM

## 2019-10-04 DIAGNOSIS — C3412 Malignant neoplasm of upper lobe, left bronchus or lung: Secondary | ICD-10-CM

## 2019-10-04 DIAGNOSIS — R5383 Other fatigue: Secondary | ICD-10-CM | POA: Diagnosis not present

## 2019-10-04 DIAGNOSIS — Z5112 Encounter for antineoplastic immunotherapy: Secondary | ICD-10-CM | POA: Diagnosis not present

## 2019-10-04 DIAGNOSIS — N183 Chronic kidney disease, stage 3 unspecified: Secondary | ICD-10-CM | POA: Diagnosis not present

## 2019-10-04 LAB — CBC WITH DIFFERENTIAL/PLATELET
Abs Immature Granulocytes: 0.01 10*3/uL (ref 0.00–0.07)
Basophils Absolute: 0 10*3/uL (ref 0.0–0.1)
Basophils Relative: 1 %
Eosinophils Absolute: 0.2 10*3/uL (ref 0.0–0.5)
Eosinophils Relative: 4 %
HCT: 46 % (ref 39.0–52.0)
Hemoglobin: 15.2 g/dL (ref 13.0–17.0)
Immature Granulocytes: 0 %
Lymphocytes Relative: 36 %
Lymphs Abs: 1.5 10*3/uL (ref 0.7–4.0)
MCH: 29.4 pg (ref 26.0–34.0)
MCHC: 33 g/dL (ref 30.0–36.0)
MCV: 89 fL (ref 80.0–100.0)
Monocytes Absolute: 0.5 10*3/uL (ref 0.1–1.0)
Monocytes Relative: 13 %
Neutro Abs: 1.9 10*3/uL (ref 1.7–7.7)
Neutrophils Relative %: 46 %
Platelets: 232 10*3/uL (ref 150–400)
RBC: 5.17 MIL/uL (ref 4.22–5.81)
RDW: 14.2 % (ref 11.5–15.5)
WBC: 4.2 10*3/uL (ref 4.0–10.5)
nRBC: 0 % (ref 0.0–0.2)

## 2019-10-04 LAB — URINALYSIS, COMPLETE (UACMP) WITH MICROSCOPIC
Bacteria, UA: NONE SEEN
Bilirubin Urine: NEGATIVE
Glucose, UA: NEGATIVE mg/dL
Hgb urine dipstick: NEGATIVE
Ketones, ur: 5 mg/dL — AB
Leukocytes,Ua: NEGATIVE
Nitrite: NEGATIVE
Protein, ur: 30 mg/dL — AB
Specific Gravity, Urine: 1.018 (ref 1.005–1.030)
Squamous Epithelial / HPF: NONE SEEN (ref 0–5)
pH: 5 (ref 5.0–8.0)

## 2019-10-04 LAB — COMPREHENSIVE METABOLIC PANEL
ALT: 24 U/L (ref 0–44)
AST: 20 U/L (ref 15–41)
Albumin: 4.2 g/dL (ref 3.5–5.0)
Alkaline Phosphatase: 48 U/L (ref 38–126)
Anion gap: 10 (ref 5–15)
BUN: 9 mg/dL (ref 8–23)
CO2: 21 mmol/L — ABNORMAL LOW (ref 22–32)
Calcium: 9.7 mg/dL (ref 8.9–10.3)
Chloride: 106 mmol/L (ref 98–111)
Creatinine, Ser: 1.03 mg/dL (ref 0.61–1.24)
GFR calc Af Amer: >60 mL/min (ref >60)
GFR calc non Af Amer: >60 mL/min (ref >60)
Glucose, Bld: 132 mg/dL — ABNORMAL HIGH (ref 70–99)
Potassium: 3.5 mmol/L (ref 3.5–5.1)
Sodium: 137 mmol/L (ref 135–145)
Total Bilirubin: 0.9 mg/dL (ref 0.3–1.2)
Total Protein: 7.3 g/dL (ref 6.5–8.1)

## 2019-10-04 MED ORDER — SODIUM CHLORIDE 0.9 % IV SOLN
10.5000 mg/kg | Freq: Once | INTRAVENOUS | Status: AC
  Administered 2019-10-04: 11:00:00 700 mg via INTRAVENOUS
  Filled 2019-10-04: qty 16

## 2019-10-04 MED ORDER — SODIUM CHLORIDE 0.9 % IV SOLN
Freq: Once | INTRAVENOUS | Status: AC
  Filled 2019-10-04: qty 250

## 2019-10-04 NOTE — Progress Notes (Signed)
[facility] OFFICE PROGRESS NOTE  Patient Care Team: [name], [name], MD as Medical Oncologist (Medical Oncology) Cathi Roan, [facility] (Pharmacist)  Cancer Staging No matching staging information was found for the patient.   Oncology History Overview Note  # OCT 2017- ADENO CA LUNG; STAGE IV [; LUL; bil supraclavicular LN; Left neck LN Bx]; ROS-1 MUTATED; s/p Carbo-alimta x1[oct 2017]  # NOV 1st 2017- XALKORI 250 mg BID; JAN 15th CT- PR;  # AUG 27th Chemo-RT to persistent LUL/mediastinal LN [s/p carbo-taxol- with RT; finished Sep 22nd 2018];   # July 14th 2020- multiple metastatic lesions of the brain [July 30 whole brain radiation finished April 21, 2019]; bone scan skull metastases /posterior left ninth rib metastases; April 04, 2019 stopped crizotinib;  #April 25, 2019-start Entrectinib 600 mg once a day; STopped in NOV 2020 [dizziness]; NOV 2020-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 On xarelto]-Lovenox; Jan mid 2018- xarelto [lovenox-insurance issues]  # MRI brain- multiple infarcts [2d echo/bubble study-NEG's/p Neurology eval]  ------------------------------------------------------------    # # s/p TURP [Sep 2017; Dr.Cope] DEC 26th CT- distended bladder   # MOLECULAR TESTING- ROS-1 POSITIVE; ALK/EGFR-NEG; PDL-1 EXPRESSION- 90%** [HIGH]  # DEC 15th 2020- PALLIATIVE CARE -----------------------------------------------------------------    DIAGNOSIS: Adenocarcinoma the lung  ROS-1 +  STAGE:  IV    ;GOALS: Palliative  CURRENT/MOST RECENT THERAPY- Avastin [C]   Primary cancer of left upper lobe of lung ([place])  08/09/2019 -  Chemotherapy   The patient had bevacizumab-awwb (MVASI) 800 mg in sodium chloride 0.9 % 100 mL chemo infusion, 725 mg, Intravenous,  Once, 3 of 6 cycles Dose modification: 10 mg/kg (original  dose 10 mg/kg, Cycle 3, Reason: Other (see comments), Comment: weight change) Administration: 800 mg (08/09/2019), 800 mg (08/22/2019), 700 mg (09/19/2019)  for chemotherapy treatment.        INTERVAL HISTORY:  Melvin Willis 69 y.o.  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  In the interim patient was evaluated by ENT for difficulty hearing dizziness-diagnosed with otitis media.  S/p antibiotics antihistamines improved.  Patient dizziness also improved.  Denies any worsening headaches.  Appetite is fair.  No weight loss.  Chronic mild shortness of breath mild fatigue.  Episode of diarrhea-currently resolved.  Review of Systems  Constitutional: Positive for malaise/fatigue. Negative for chills, diaphoresis, fever and weight loss.  HENT: Positive for hearing loss. Negative for nosebleeds and sore throat.   Eyes: Negative for double vision.  Respiratory: Negative for cough, hemoptysis, sputum production, shortness of breath and wheezing.   Cardiovascular: Negative for chest pain (Left upper chest wall pain chronic), palpitations and orthopnea.  Gastrointestinal: Negative for abdominal pain, blood in stool, constipation, diarrhea, heartburn and melena.  Genitourinary: Negative for dysuria.  Musculoskeletal: Positive for joint pain. Negative for back pain.  Skin: Negative.  Negative for itching and rash.  Neurological: Positive for dizziness. Negative for tingling, focal weakness and weakness.  Endo/Heme/Allergies: Does not bruise/bleed easily.  Psychiatric/Behavioral: Positive for memory loss. Negative for depression. The patient is not nervous/anxious and does not have insomnia.     PAST MEDICAL HISTORY :  Past Medical History:  Diagnosis Date  . Abnormal prostate specific antigen 08/08/2012  . Adiposity 04/16/2015  . Chronic kidney disease (CKD), stage III (moderate) 11/27/2016  .  CVA  (cerebral vascular accident) ([place]) 06/17/2016  . Diabetes mellitus without complication ([place])   . Diverticulosis of sigmoid colon 04/16/2015  . Dyslipidemia 03/18/2015  . Hemorrhoids, internal 04/16/2015  . Hypercholesteremia 04/16/2015  . Hyperlipidemia   . Hypertension   . Primary cancer of left upper lobe of lung ([place])   . Pulmonary embolism ([place])   . Wears dentures    partial upper    PAST SURGICAL HISTORY :   Past Surgical History:  Procedure Laterality Date  . COLONOSCOPY    . COLONOSCOPY WITH PROPOFOL N/A 05/06/2015   Procedure: COLONOSCOPY WITH PROPOFOL;  Surgeon: Lucilla Lame, MD;  Location: [facility];  Service: Endoscopy;  Laterality: N/A;  ASCENDING COLON POLYPS X 2 TERMINAL ILEUM BIOPSY RANDOM COLON BX. TRANSVERSE COLON POLYP SIGMOID COLON POLYP  . ESOPHAGOGASTRODUODENOSCOPY (EGD) WITH PROPOFOL N/A 05/06/2015   Procedure: ESOPHAGOGASTRODUODENOSCOPY (EGD) WITH PROPOFOL;  Surgeon: Lucilla Lame, MD;  Location: [facility];  Service: Endoscopy;  Laterality: N/A;  GASTRIC BIOPSY X1  . KIDNEY STONE SURGERY  2017    FAMILY HISTORY :   Family History  Problem Relation Age of Onset  . Diabetes Mother   . Diabetes Father   . CAD Father   . Dementia Father   . Diabetes Sister   . Cancer Maternal Uncle        Prostate  . Cancer Cousin        prostate    SOCIAL HISTORY:   Social History   Tobacco Use  . Smoking status: Never Smoker  . Smokeless tobacco: Never Used  Substance Use Topics  . Alcohol use: No    Alcohol/week: 0.0 standard drinks  . Drug use: No    ALLERGIES:  has No Known Allergies.  MEDICATIONS:  Current Outpatient Medications  Medication Sig Dispense Refill  . atorvastatin (LIPITOR) 40 MG tablet Take 1 tablet (40 mg total) by mouth 3 (three) times a week. 48 tablet 1  . blood glucose meter kit and supplies Dispense based on patient and insurance preference (Accuchek Aviva Plus, Accuchek Nano). Use up to four times daily as directed.  (FOR ICD-10 E10.9, E11.9). 1 each 0  . entrectinib (ROZLYTREK) 200 MG capsule Take 3 capsules (600 mg total) by mouth daily. 90 capsule 6  . fluticasone (FLONASE) 50 MCG/ACT nasal spray Place 2 sprays into both nostrils daily. 16 g 2  . guaiFENesin (MUCINEX) 600 MG 12 hr tablet Take 1 tablet (600 mg total) by mouth 2 (two) times daily. 20 tablet 0  . insulin degludec (TRESIBA FLEXTOUCH) 100 UNIT/ML SOPN FlexTouch Pen Inject 0.1 mLs (10 Units total) into the skin daily. 15 mL 0  . metFORMIN (GLUCOPHAGE) 500 MG tablet Take 1 tablet (500 mg total) by mouth 3 (three) times daily. 270 tablet 0  . ondansetron (ZOFRAN) 8 MG tablet One pill every 8 hours as needed for nausea/vomitting. 40 tablet 1  . tamsulosin (FLOMAX) 0.4 MG CAPS capsule Take 1 capsule (0.4 mg total) by mouth every morning. 90 capsule 3  . XARELTO 20 MG TABS tablet TAKE 1 TABLET BY MOUTH ONCE DAILY WITH SUPPER 30 tablet 0   No current facility-administered medications for this visit.    PHYSICAL EXAMINATION: ECOG PERFORMANCE STATUS: 0 - Asymptomatic  BP (!) 154/87 (BP Location: Right Arm, Patient Position: Sitting)   Pulse (!) 105   Temp (!) 97.5 F (36.4 C) (Tympanic)   Resp 20   Ht '5\' 4"'  (1.626 m)   Wt 147 lb (66.7 kg)  SpO2 100% Comment: RA  BMI 25.23 kg/m   Filed Weights   10/04/19 0925  Weight: 147 lb (66.7 kg)    Physical Exam  Constitutional: He is oriented to person, place, and time and well-developed, well-nourished, and in no distress.  Is alone.  He is walking by himself.  HENT:  Head: Normocephalic and atraumatic.  Mouth/Throat: Oropharynx is clear and moist. No oropharyngeal exudate.  Eyes: Pupils are equal, round, and reactive to light.  Cardiovascular: Normal rate and regular rhythm.  Pulmonary/Chest: No respiratory distress. He has no wheezes.  Abdominal: Soft. Bowel sounds are normal. He exhibits no distension and no mass. There is no abdominal tenderness. There is no rebound and no guarding.   Musculoskeletal:        General: No tenderness or edema. Normal range of motion.     Cervical back: Normal range of motion and neck supple.  Neurological: He is alert and oriented to person, place, and time.  Skin: Skin is warm.  Psychiatric: Affect normal.   LABORATORY DATA:  I have reviewed the data as listed    Component Value Date/Time   NA 137 10/04/2019 0907   NA 139 01/01/2016 1000   K 3.5 10/04/2019 0907   CL 106 10/04/2019 0907   CO2 21 (L) 10/04/2019 0907   GLUCOSE 132 (H) 10/04/2019 0907   BUN 9 10/04/2019 0907   BUN 13 01/01/2016 1000   CREATININE 1.03 10/04/2019 0907   CREATININE 1.32 (H) 08/22/2018 1107   CALCIUM 9.7 10/04/2019 0907   PROT 7.3 10/04/2019 0907   PROT 7.2 01/01/2016 1000   ALBUMIN 4.2 10/04/2019 0907   ALBUMIN 4.4 01/01/2016 1000   AST 20 10/04/2019 0907   ALT 24 10/04/2019 0907   ALKPHOS 48 10/04/2019 0907   BILITOT 0.9 10/04/2019 0907   BILITOT 0.4 01/01/2016 1000   GFRNONAA >60 10/04/2019 0907   GFRNONAA 56 (L) 08/22/2018 1107   GFRAA >60 10/04/2019 0907   GFRAA 65 08/22/2018 1107    No results found for: SPEP, UPEP  Lab Results  Component Value Date   WBC 4.2 10/04/2019   NEUTROABS 1.9 10/04/2019   HGB 15.2 10/04/2019   HCT 46.0 10/04/2019   MCV 89.0 10/04/2019   PLT 232 10/04/2019      Chemistry      Component Value Date/Time   NA 137 10/04/2019 0907   NA 139 01/01/2016 1000   K 3.5 10/04/2019 0907   CL 106 10/04/2019 0907   CO2 21 (L) 10/04/2019 0907   BUN 9 10/04/2019 0907   BUN 13 01/01/2016 1000   CREATININE 1.03 10/04/2019 0907   CREATININE 1.32 (H) 08/22/2018 1107      Component Value Date/Time   CALCIUM 9.7 10/04/2019 0907   ALKPHOS 48 10/04/2019 0907   AST 20 10/04/2019 0907   ALT 24 10/04/2019 0907   BILITOT 0.9 10/04/2019 0907   BILITOT 0.4 01/01/2016 1000       RADIOGRAPHIC STUDIES: I have personally reviewed the radiological images as listed and agreed with the findings in the report. No results  found.   ASSESSMENT & PLAN:  Primary cancer of left upper lobe of lung ([place]) # Adenocarcinoma of the lung metastatic/stage IV-ROS-1 positive: NOV 17th CT chest left upper lobe treatment changes; right lower lobe-mild groundglass opacity-without an obvious progression of disease. NOV 2020- MRI brain-overall improved except increasing left temporal lesion [see discussion below] patient currently OFF Rozyltrek 2pills sec to dizzyness Currently on avastin every 2 weeks. STABLE.    #  Proceed with avastin cycle #4  today. Labs today reviewed;  acceptable for treatment today. Continue to HOLD Entrectinib for now; will restart after avastin is done.  Will order MRI at next visit.; will improved/stable will switch back to [place].    # Difficulty hearing- improved s/p ENT evaluation.  # Dizziness-unclear etiology-question brain metastases versus entrectinib [stopped dec1st 2669]-? Sec OM.Improved.   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  # Elevated PSA- 7;s/p evaluation with Dr.Stoiff-MR prostate Biopsy is on hold; will repat PSA at next visit.   # DISPOSITION: # avastin today # follow up in 2 weeks-MD;labs- cbc/cmp/UA; PSA- avastin- Dr.B    No orders of the defined types were placed in this encounter.  All questions were answered. The patient knows to call the clinic with any problems, questions or concerns.      Cammie Sickle, MD 10/04/2019 10:09 AM

## 2019-10-04 NOTE — Progress Notes (Signed)
HR 105 today. Per [name] okay to proceed with treatment today

## 2019-10-04 NOTE — Assessment & Plan Note (Addendum)
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 NOV 2020- MRI brain-overall improved except increasing left temporal lesion [see discussion below] patient currently OFF Rozyltrek 2pills sec to dizzyness Currently on avastin every 2 weeks. STABLE.    # Proceed with avastin cycle #4  today. Labs today reviewed;  acceptable for treatment today. Continue to HOLD Entrectinib for now; will restart after avastin is done.  Will order MRI at next visit.; will improved/stable will switch back to [place].    # Difficulty hearing- improved s/p ENT evaluation.  # Dizziness-unclear etiology-question brain metastases versus entrectinib [stopped dec1st 8786]-? Sec OM.Improved.   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  # Elevated PSA- 7;-MR prostate Biopsy is on hold-given brain mets/active therapy on Avastin; will repeat PSA at next visit.  Awaiting evaluation with Dr. Bernardo Heater next week.  # DISPOSITION: # avastin today # follow up in 2 weeks-MD;labs- cbc/cmp/UA; PSA- avastin- Dr.B

## 2019-10-05 ENCOUNTER — Telehealth: Payer: Self-pay | Admitting: Family Medicine

## 2019-10-05 DIAGNOSIS — E7800 Pure hypercholesterolemia, unspecified: Secondary | ICD-10-CM

## 2019-10-05 NOTE — Telephone Encounter (Signed)
Medication Refill - Medication: atorvastatin (LIPITOR) 40 MG tablet  Has the patient contacted their pharmacy? no (Agent: If no, request that the patient contact the pharmacy for the refill.) (Agent: If yes, when and what did the pharmacy advise?)  Preferred Pharmacy (with phone number or street name):  [facility], [place] Phone:  [phone]  Fax:  [phone]     Agent: Please be advised that RX refills may take up to 3 business days. We ask that you follow-up with your pharmacy.

## 2019-10-07 MED ORDER — ATORVASTATIN CALCIUM 40 MG PO TABS: 40.0000 mg | ORAL_TABLET | ORAL | 1 refills | Status: DC

## 2019-10-09 ENCOUNTER — Telehealth: Payer: Self-pay | Admitting: *Deleted

## 2019-10-09 NOTE — Telephone Encounter (Signed)
Patient called. He received letter in  Mail that the [place] was covered under a level one tier. He wanted to know how much his out of pocket copay would be. He is not out of the xarelto at this time. However, I have asked the patient to contact the pharmacy to determine his out of pocket expense. He is worried that the copay will be extremely high and will not be able to afford this.  Pt will contact the cancer center if he is unable to afford his copay.

## 2019-10-09 NOTE — Telephone Encounter (Signed)
Voicemail full. Dr Ancil Boozer sent in a refill but is asking that he schedule an appointment in March or April

## 2019-10-13 ENCOUNTER — Telehealth: Payer: Medicare Other | Admitting: Urology

## 2019-10-17 ENCOUNTER — Inpatient Hospital Stay: Payer: Medicare Other | Attending: Hospice and Palliative Medicine | Admitting: Hospice and Palliative Medicine

## 2019-10-17 ENCOUNTER — Other Ambulatory Visit: Payer: Self-pay

## 2019-10-17 DIAGNOSIS — Z818 Family history of other mental and behavioral disorders: Secondary | ICD-10-CM | POA: Insufficient documentation

## 2019-10-17 DIAGNOSIS — N183 Chronic kidney disease, stage 3 unspecified: Secondary | ICD-10-CM | POA: Insufficient documentation

## 2019-10-17 DIAGNOSIS — Z7901 Long term (current) use of anticoagulants: Secondary | ICD-10-CM | POA: Insufficient documentation

## 2019-10-17 DIAGNOSIS — Z515 Encounter for palliative care: Secondary | ICD-10-CM

## 2019-10-17 DIAGNOSIS — C7931 Secondary malignant neoplasm of brain: Secondary | ICD-10-CM | POA: Insufficient documentation

## 2019-10-17 DIAGNOSIS — Z5112 Encounter for antineoplastic immunotherapy: Secondary | ICD-10-CM | POA: Insufficient documentation

## 2019-10-17 DIAGNOSIS — R42 Dizziness and giddiness: Secondary | ICD-10-CM | POA: Insufficient documentation

## 2019-10-17 DIAGNOSIS — R5383 Other fatigue: Secondary | ICD-10-CM | POA: Insufficient documentation

## 2019-10-17 DIAGNOSIS — Z8673 Personal history of transient ischemic attack (TIA), and cerebral infarction without residual deficits: Secondary | ICD-10-CM | POA: Insufficient documentation

## 2019-10-17 DIAGNOSIS — Z833 Family history of diabetes mellitus: Secondary | ICD-10-CM | POA: Insufficient documentation

## 2019-10-17 DIAGNOSIS — Z86711 Personal history of pulmonary embolism: Secondary | ICD-10-CM | POA: Insufficient documentation

## 2019-10-17 DIAGNOSIS — R634 Abnormal weight loss: Secondary | ICD-10-CM | POA: Insufficient documentation

## 2019-10-17 DIAGNOSIS — Z8719 Personal history of other diseases of the digestive system: Secondary | ICD-10-CM | POA: Insufficient documentation

## 2019-10-17 DIAGNOSIS — Z86010 Personal history of colonic polyps: Secondary | ICD-10-CM | POA: Insufficient documentation

## 2019-10-17 DIAGNOSIS — C3412 Malignant neoplasm of upper lobe, left bronchus or lung: Secondary | ICD-10-CM | POA: Diagnosis not present

## 2019-10-17 DIAGNOSIS — Z79899 Other long term (current) drug therapy: Secondary | ICD-10-CM | POA: Insufficient documentation

## 2019-10-17 DIAGNOSIS — Z8042 Family history of malignant neoplasm of prostate: Secondary | ICD-10-CM | POA: Insufficient documentation

## 2019-10-17 DIAGNOSIS — R972 Elevated prostate specific antigen [PSA]: Secondary | ICD-10-CM | POA: Insufficient documentation

## 2019-10-17 DIAGNOSIS — Z8249 Family history of ischemic heart disease and other diseases of the circulatory system: Secondary | ICD-10-CM | POA: Insufficient documentation

## 2019-10-17 DIAGNOSIS — E1122 Type 2 diabetes mellitus with diabetic chronic kidney disease: Secondary | ICD-10-CM | POA: Insufficient documentation

## 2019-10-17 DIAGNOSIS — M2550 Pain in unspecified joint: Secondary | ICD-10-CM | POA: Insufficient documentation

## 2019-10-17 NOTE — Progress Notes (Signed)
Virtual Visit via Telephone Note  I connected with Melvin Willis on 10/17/19 at  2:30 PM EST by telephone and verified that I am speaking with the correct person using two identifiers.   I discussed the limitations, risks, security and privacy concerns of performing an evaluation and management service by telephone and the availability of in person appointments. I also discussed with the patient that there may be a patient responsible charge related to this service. The patient expressed understanding and agreed to proceed.   History of Present Illness: Melvin Willis is a 69 y.o. 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  Patient is status post whole brain radiation and is currently on treatment with Avastin.  MRI of the brain on 07/25/2019 revealed progression of enhancing lesion in the left temporal lobe concerning for either radiation necrosis or progressive metastatic disease.  Patient was referred to palliative care to help address goals and manage ongoing symptoms.   Observations/Objective: I called spoke with patient by phone.  He reports that he is doing about the same.  He denies any significant changes or concerns today.  No distressing symptoms were reported.  Patient has follow-up visit tomorrow with Dr. Rogue Bussing.  Patient says that his oral intake is still poor.  Weight was actually stable at time of last visit.  Again, weight will be reassessed tomorrow.  We did discuss ways of increasing caloric intake.  Patient has also talked with clinic dietitian.  I recommended that he start oral supplements 2-3 times daily if he can tolerate.  I previously sent patient home with ACP/MOST Form.  Patient says that he and his wife have talked some about decision-making but that he also wants to include his children.  Assessment and Plan: Stage IV lung cancer -currently on treatment with Avastin.  Has follow-up visit  tomorrow with Dr. Rogue Bussing  Weight loss -followed by RD.  Recommended starting oral supplements 2-3 times daily.  Follow weight trends.  Could consider appetite stimulant if needed.  ACP -patient discussing decision making with his family  Follow Up Instructions: Follow-up telephone visit in about 3 weeks   I discussed the assessment and treatment plan with the patient. The patient was provided an opportunity to ask questions and all were answered. The patient agreed with the plan and demonstrated an understanding of the instructions.   The patient was advised to call back or seek an in-person evaluation if the symptoms worsen or if the condition fails to improve as anticipated.  I provided 10 minutes of non-face-to-face time during this encounter.   Irean Hong, NP

## 2019-10-18 ENCOUNTER — Other Ambulatory Visit: Payer: Self-pay

## 2019-10-18 ENCOUNTER — Inpatient Hospital Stay: Payer: Medicare Other

## 2019-10-18 ENCOUNTER — Inpatient Hospital Stay (HOSPITAL_BASED_OUTPATIENT_CLINIC_OR_DEPARTMENT_OTHER): Payer: Medicare Other | Admitting: Internal Medicine

## 2019-10-18 VITALS — BP 153/86 | HR 98 | Temp 97.4°F | Resp 20 | Wt 142.0 lb

## 2019-10-18 DIAGNOSIS — Z86010 Personal history of colonic polyps: Secondary | ICD-10-CM | POA: Diagnosis not present

## 2019-10-18 DIAGNOSIS — R42 Dizziness and giddiness: Secondary | ICD-10-CM | POA: Diagnosis not present

## 2019-10-18 DIAGNOSIS — Z8673 Personal history of transient ischemic attack (TIA), and cerebral infarction without residual deficits: Secondary | ICD-10-CM | POA: Diagnosis not present

## 2019-10-18 DIAGNOSIS — C3412 Malignant neoplasm of upper lobe, left bronchus or lung: Secondary | ICD-10-CM

## 2019-10-18 DIAGNOSIS — C7931 Secondary malignant neoplasm of brain: Secondary | ICD-10-CM

## 2019-10-18 DIAGNOSIS — R972 Elevated prostate specific antigen [PSA]: Secondary | ICD-10-CM

## 2019-10-18 DIAGNOSIS — Z7901 Long term (current) use of anticoagulants: Secondary | ICD-10-CM | POA: Diagnosis not present

## 2019-10-18 DIAGNOSIS — E1122 Type 2 diabetes mellitus with diabetic chronic kidney disease: Secondary | ICD-10-CM | POA: Diagnosis not present

## 2019-10-18 DIAGNOSIS — Z8042 Family history of malignant neoplasm of prostate: Secondary | ICD-10-CM | POA: Diagnosis not present

## 2019-10-18 DIAGNOSIS — R634 Abnormal weight loss: Secondary | ICD-10-CM | POA: Diagnosis not present

## 2019-10-18 DIAGNOSIS — R5383 Other fatigue: Secondary | ICD-10-CM | POA: Diagnosis not present

## 2019-10-18 DIAGNOSIS — Z8719 Personal history of other diseases of the digestive system: Secondary | ICD-10-CM | POA: Diagnosis not present

## 2019-10-18 DIAGNOSIS — Z5112 Encounter for antineoplastic immunotherapy: Secondary | ICD-10-CM | POA: Diagnosis not present

## 2019-10-18 DIAGNOSIS — M2550 Pain in unspecified joint: Secondary | ICD-10-CM | POA: Diagnosis not present

## 2019-10-18 DIAGNOSIS — Z818 Family history of other mental and behavioral disorders: Secondary | ICD-10-CM | POA: Diagnosis not present

## 2019-10-18 DIAGNOSIS — Z833 Family history of diabetes mellitus: Secondary | ICD-10-CM | POA: Diagnosis not present

## 2019-10-18 DIAGNOSIS — N183 Chronic kidney disease, stage 3 unspecified: Secondary | ICD-10-CM | POA: Diagnosis not present

## 2019-10-18 DIAGNOSIS — Z79899 Other long term (current) drug therapy: Secondary | ICD-10-CM | POA: Diagnosis not present

## 2019-10-18 DIAGNOSIS — Z86711 Personal history of pulmonary embolism: Secondary | ICD-10-CM | POA: Diagnosis not present

## 2019-10-18 DIAGNOSIS — Z8249 Family history of ischemic heart disease and other diseases of the circulatory system: Secondary | ICD-10-CM | POA: Diagnosis not present

## 2019-10-18 LAB — URINALYSIS, COMPLETE (UACMP) WITH MICROSCOPIC
Bacteria, UA: NONE SEEN
Bilirubin Urine: NEGATIVE
Glucose, UA: NEGATIVE mg/dL
Ketones, ur: NEGATIVE mg/dL
Leukocytes,Ua: NEGATIVE
Nitrite: NEGATIVE
Protein, ur: NEGATIVE mg/dL
Specific Gravity, Urine: 1.013 (ref 1.005–1.030)
pH: 5 (ref 5.0–8.0)

## 2019-10-18 LAB — COMPREHENSIVE METABOLIC PANEL
ALT: 17 U/L (ref 0–44)
AST: 17 U/L (ref 15–41)
Albumin: 4.2 g/dL (ref 3.5–5.0)
Alkaline Phosphatase: 56 U/L (ref 38–126)
Anion gap: 12 (ref 5–15)
BUN: 9 mg/dL (ref 8–23)
CO2: 21 mmol/L — ABNORMAL LOW (ref 22–32)
Calcium: 9.7 mg/dL (ref 8.9–10.3)
Chloride: 101 mmol/L (ref 98–111)
Creatinine, Ser: 0.97 mg/dL (ref 0.61–1.24)
GFR calc Af Amer: >60 mL/min (ref >60)
GFR calc non Af Amer: >60 mL/min (ref >60)
Glucose, Bld: 185 mg/dL — ABNORMAL HIGH (ref 70–99)
Potassium: 4 mmol/L (ref 3.5–5.1)
Sodium: 134 mmol/L — ABNORMAL LOW (ref 135–145)
Total Bilirubin: 0.9 mg/dL (ref 0.3–1.2)
Total Protein: 7.3 g/dL (ref 6.5–8.1)

## 2019-10-18 LAB — CBC WITH DIFFERENTIAL/PLATELET
Abs Immature Granulocytes: 0 10*3/uL (ref 0.00–0.07)
Basophils Absolute: 0 10*3/uL (ref 0.0–0.1)
Basophils Relative: 1 %
Eosinophils Absolute: 0.2 10*3/uL (ref 0.0–0.5)
Eosinophils Relative: 4 %
HCT: 47.6 % (ref 39.0–52.0)
Hemoglobin: 15.5 g/dL (ref 13.0–17.0)
Immature Granulocytes: 0 %
Lymphocytes Relative: 36 %
Lymphs Abs: 1.5 10*3/uL (ref 0.7–4.0)
MCH: 29.1 pg (ref 26.0–34.0)
MCHC: 32.6 g/dL (ref 30.0–36.0)
MCV: 89.5 fL (ref 80.0–100.0)
Monocytes Absolute: 0.6 10*3/uL (ref 0.1–1.0)
Monocytes Relative: 14 %
Neutro Abs: 1.9 10*3/uL (ref 1.7–7.7)
Neutrophils Relative %: 45 %
Platelets: 226 10*3/uL (ref 150–400)
RBC: 5.32 MIL/uL (ref 4.22–5.81)
RDW: 14.1 % (ref 11.5–15.5)
WBC: 4.1 10*3/uL (ref 4.0–10.5)
nRBC: 0 % (ref 0.0–0.2)

## 2019-10-18 LAB — PSA: Prostatic Specific Antigen: 12.17 ng/mL — ABNORMAL HIGH (ref 0.00–4.00)

## 2019-10-18 MED ORDER — SODIUM CHLORIDE 0.9 % IV SOLN
10.4000 mg/kg | Freq: Once | INTRAVENOUS | Status: AC
  Administered 2019-10-18: 700 mg via INTRAVENOUS
  Filled 2019-10-18: qty 16

## 2019-10-18 MED ORDER — SODIUM CHLORIDE 0.9 % IV SOLN
Freq: Once | INTRAVENOUS | Status: AC
  Filled 2019-10-18: qty 250

## 2019-10-18 NOTE — Assessment & Plan Note (Addendum)
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 NOV 2020- MRI brain-overall improved except increasing left temporal lesion [see discussion below] patient currently OFF Rozyltrek 2pills sec to dizzyness Currently on avastin every 2 weeks.  Stable  # Proceed with avastin cycle #5  today. Labs today reviewed;  acceptable for treatment today. Continue to HOLD Entrectinib for now.  We will get MRI of the brain prior to next visit.  # Bilateral PE & left lower extremity DVT:  on Xarelto;STABLE.   # Elevated PSA- 7;-MR prostate Biopsy is on hold-given brain mets/active therapy on Avastin-await PSA.  Will discuss with Dr. Bernardo Heater.  # weight loss-unclear etiology.  Referral to dietitian.  #Discussed with patient's wife over the phone.  # DISPOSITION: # referral to Dietician- weight loss # avastin today # follow up in 2 weeks-MD;labs- cbc/cmp/UA; avastin; MRI Brain prior- Dr.B

## 2019-10-18 NOTE — Progress Notes (Signed)
[facility] OFFICE PROGRESS NOTE  Patient Care Team: Sowles, Krichna, MD as PCP - General (Family Medicine) ,  R, MD as Medical Oncologist (Medical Oncology) Hedrick, Julie E, RPH (Pharmacist)  Cancer Staging No matching staging information was found for the patient.   Oncology History Overview Note  # OCT 2017- ADENO CA LUNG; STAGE IV [; LUL; bil supraclavicular LN; Left neck LN Bx]; ROS-1 MUTATED; s/p Carbo-alimta x1[oct 2017]  # NOV 1st 2017- XALKORI 250 mg BID; JAN 15th CT- PR;  # AUG 27th Chemo-RT to persistent LUL/mediastinal LN [s/p carbo-taxol- with RT; finished Sep 22nd 2018];   # July 14th 2020- multiple metastatic lesions of the brain [July 30 whole brain radiation finished April 21, 2019]; bone scan skull metastases /posterior left ninth rib metastases; April 04, 2019 stopped crizotinib;  #April 25, 2019-start Entrectinib 600 mg once a day; STopped in NOV 2020 [dizziness]; NOV 2020-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 On xarelto]-Lovenox; Jan mid 2018- xarelto [lovenox-insurance issues]  # MRI brain- multiple infarcts [2d echo/bubble study-NEG's/p Neurology eval]  ------------------------------------------------------------    # # s/p TURP [Sep 2017; Dr.Cope] DEC 26th CT- distended bladder   # MOLECULAR TESTING- ROS-1 POSITIVE; ALK/EGFR-NEG; PDL-1 EXPRESSION- 90%** [HIGH]  # DEC 15th 2020- PALLIATIVE CARE -----------------------------------------------------------------    DIAGNOSIS: Adenocarcinoma the lung  ROS-1 +  STAGE:  IV    ;GOALS: Palliative  CURRENT/MOST RECENT THERAPY- Avastin [C]   Primary cancer of left upper lobe of lung (HCC)  08/09/2019 -  Chemotherapy   The patient had bevacizumab-awwb (MVASI) 800 mg in sodium chloride 0.9 % 100 mL chemo infusion, 725 mg, Intravenous,  Once, 5 of 6 cycles Dose modification: 10 mg/kg (original  dose 10 mg/kg, Cycle 3, Reason: Other (see comments), Comment: weight change) Administration: 800 mg (08/09/2019), 800 mg (08/22/2019), 700 mg (09/19/2019), 700 mg (10/04/2019), 700 mg (10/18/2019)  for chemotherapy treatment.        INTERVAL HISTORY:  Melvin Willis 69 y.o.  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  Patient states his appetite is good.  However has lost weight.  Denies any headaches.  Denies any nausea vomiting or dizziness.    Review of Systems  Constitutional: Positive for malaise/fatigue. Negative for chills, diaphoresis, fever and weight loss.  HENT: Negative for nosebleeds and sore throat.   Eyes: Negative for double vision.  Respiratory: Negative for cough, hemoptysis, sputum production, shortness of breath and wheezing.   Cardiovascular: Negative for chest pain (Left upper chest wall pain chronic), palpitations and orthopnea.  Gastrointestinal: Negative for abdominal pain, blood in stool, constipation, diarrhea, heartburn and melena.  Genitourinary: Negative for dysuria.  Musculoskeletal: Positive for joint pain. Negative for back pain.  Skin: Negative.  Negative for itching and rash.  Neurological: Negative for tingling, focal weakness and weakness.  Endo/Heme/Allergies: Does not bruise/bleed easily.  Psychiatric/Behavioral: Positive for memory loss. Negative for depression. The patient is not nervous/anxious and does not have insomnia.     PAST MEDICAL HISTORY :  Past Medical History:  Diagnosis Date  . Abnormal prostate specific antigen 08/08/2012  . Adiposity 04/16/2015  . Chronic kidney disease (CKD), stage III (moderate) 11/27/2016  . CVA (cerebral vascular accident) (HCC) 06/17/2016  . Diabetes mellitus without complication (HCC)   . Diverticulosis of sigmoid colon 04/16/2015  . Dyslipidemia 03/18/2015  .   Hemorrhoids, internal 04/16/2015  . Hypercholesteremia  04/16/2015  . Hyperlipidemia   . Hypertension   . Primary cancer of left upper lobe of lung ([place])   . Pulmonary embolism ([place])   . Wears dentures    partial upper    PAST SURGICAL HISTORY :   Past Surgical History:  Procedure Laterality Date  . COLONOSCOPY    . COLONOSCOPY WITH PROPOFOL N/A 05/06/2015   Procedure: COLONOSCOPY WITH PROPOFOL;  Surgeon: Lucilla Lame, MD;  Location: [facility];  Service: Endoscopy;  Laterality: N/A;  ASCENDING COLON POLYPS X 2 TERMINAL ILEUM BIOPSY RANDOM COLON BX. TRANSVERSE COLON POLYP SIGMOID COLON POLYP  . ESOPHAGOGASTRODUODENOSCOPY (EGD) WITH PROPOFOL N/A 05/06/2015   Procedure: ESOPHAGOGASTRODUODENOSCOPY (EGD) WITH PROPOFOL;  Surgeon: Lucilla Lame, MD;  Location: [facility];  Service: Endoscopy;  Laterality: N/A;  GASTRIC BIOPSY X1  . KIDNEY STONE SURGERY  2017    FAMILY HISTORY :   Family History  Problem Relation Age of Onset  . Diabetes Mother   . Diabetes Father   . CAD Father   . Dementia Father   . Diabetes Sister   . Cancer Maternal Uncle        Prostate  . Cancer Cousin        prostate    SOCIAL HISTORY:   Social History   Tobacco Use  . Smoking status: Never Smoker  . Smokeless tobacco: Never Used  Substance Use Topics  . Alcohol use: No    Alcohol/week: 0.0 standard drinks  . Drug use: No    ALLERGIES:  has No Known Allergies.  MEDICATIONS:  Current Outpatient Medications  Medication Sig Dispense Refill  . atorvastatin (LIPITOR) 40 MG tablet Take 1 tablet (40 mg total) by mouth 3 (three) times a week. 48 tablet 1  . blood glucose meter kit and supplies Dispense based on patient and insurance preference (Accuchek Aviva Plus, Accuchek Nano). Use up to four times daily as directed. (FOR ICD-10 E10.9, E11.9). 1 each 0  . fluticasone (FLONASE) 50 MCG/ACT nasal spray Place 2 sprays into both nostrils daily. 16 g 2  . insulin degludec (TRESIBA FLEXTOUCH) 100 UNIT/ML SOPN FlexTouch Pen Inject 0.1 mLs (10 Units  total) into the skin daily. 15 mL 0  . metFORMIN (GLUCOPHAGE) 500 MG tablet Take 1 tablet (500 mg total) by mouth 3 (three) times daily. 270 tablet 0  . ondansetron (ZOFRAN) 8 MG tablet One pill every 8 hours as needed for nausea/vomitting. 40 tablet 1  . tamsulosin (FLOMAX) 0.4 MG CAPS capsule Take 1 capsule (0.4 mg total) by mouth every morning. 90 capsule 3  . XARELTO 20 MG TABS tablet TAKE 1 TABLET BY MOUTH ONCE DAILY WITH SUPPER 30 tablet 0  . entrectinib (ROZLYTREK) 200 MG capsule Take 3 capsules (600 mg total) by mouth daily. (Patient not taking: Reported on 10/17/2019) 90 capsule 6  . guaiFENesin (MUCINEX) 600 MG 12 hr tablet Take 1 tablet (600 mg total) by mouth 2 (two) times daily. (Patient not taking: Reported on 10/17/2019) 20 tablet 0   No current facility-administered medications for this visit.    PHYSICAL EXAMINATION: ECOG PERFORMANCE STATUS: 0 - Asymptomatic  BP (!) 153/86 (BP Location: Left Arm, Patient Position: Sitting, Cuff Size: Normal)   Pulse 98   Temp (!) 97.4 F (36.3 C) (Tympanic)   Resp 20   Wt 142 lb (64.4 kg)   BMI 24.37 kg/m   Filed Weights   10/18/19 0926  Weight: 142 lb (64.4 kg)  Physical Exam  Constitutional: He is oriented to person, place, and time and well-developed, well-nourished, and in no distress.  Is alone.  He is walking by himself.  HENT:  Head: Normocephalic and atraumatic.  Mouth/Throat: Oropharynx is clear and moist. No oropharyngeal exudate.  Eyes: Pupils are equal, round, and reactive to light.  Cardiovascular: Normal rate and regular rhythm.  Pulmonary/Chest: No respiratory distress. He has no wheezes.  Abdominal: Soft. Bowel sounds are normal. He exhibits no distension and no mass. There is no abdominal tenderness. There is no rebound and no guarding.  Musculoskeletal:        General: No tenderness or edema. Normal range of motion.     Cervical back: Normal range of motion and neck supple.  Neurological: He is alert and  oriented to person, place, and time.  Skin: Skin is warm.  Psychiatric: Affect normal.   LABORATORY DATA:  I have reviewed the data as listed    Component Value Date/Time   NA 134 (L) 10/18/2019 0851   NA 139 01/01/2016 1000   K 4.0 10/18/2019 0851   CL 101 10/18/2019 0851   CO2 21 (L) 10/18/2019 0851   GLUCOSE 185 (H) 10/18/2019 0851   BUN 9 10/18/2019 0851   BUN 13 01/01/2016 1000   CREATININE 0.97 10/18/2019 0851   CREATININE 1.32 (H) 08/22/2018 1107   CALCIUM 9.7 10/18/2019 0851   PROT 7.3 10/18/2019 0851   PROT 7.2 01/01/2016 1000   ALBUMIN 4.2 10/18/2019 0851   ALBUMIN 4.4 01/01/2016 1000   AST 17 10/18/2019 0851   ALT 17 10/18/2019 0851   ALKPHOS 56 10/18/2019 0851   BILITOT 0.9 10/18/2019 0851   BILITOT 0.4 01/01/2016 1000   GFRNONAA >60 10/18/2019 0851   GFRNONAA 56 (L) 08/22/2018 1107   GFRAA >60 10/18/2019 0851   GFRAA 65 08/22/2018 1107    No results found for: SPEP, UPEP  Lab Results  Component Value Date   WBC 4.1 10/18/2019   NEUTROABS 1.9 10/18/2019   HGB 15.5 10/18/2019   HCT 47.6 10/18/2019   MCV 89.5 10/18/2019   PLT 226 10/18/2019      Chemistry      Component Value Date/Time   NA 134 (L) 10/18/2019 0851   NA 139 01/01/2016 1000   K 4.0 10/18/2019 0851   CL 101 10/18/2019 0851   CO2 21 (L) 10/18/2019 0851   BUN 9 10/18/2019 0851   BUN 13 01/01/2016 1000   CREATININE 0.97 10/18/2019 0851   CREATININE 1.32 (H) 08/22/2018 1107      Component Value Date/Time   CALCIUM 9.7 10/18/2019 0851   ALKPHOS 56 10/18/2019 0851   AST 17 10/18/2019 0851   ALT 17 10/18/2019 0851   BILITOT 0.9 10/18/2019 0851   BILITOT 0.4 01/01/2016 1000       RADIOGRAPHIC STUDIES: I have personally reviewed the radiological images as listed and agreed with the findings in the report. No results found.   ASSESSMENT & PLAN:  Primary cancer of left upper lobe of lung ([place]) # Adenocarcinoma of the lung metastatic/stage IV-ROS-1 positive: NOV 17th CT chest  left upper lobe treatment changes; right lower lobe-mild groundglass opacity-without an obvious progression of disease. NOV 2020- MRI brain-overall improved except increasing left temporal lesion [see discussion below] patient currently OFF Rozyltrek 2pills sec to dizzyness Currently on avastin every 2 weeks.  Stable  # Proceed with avastin cycle #5  today. Labs today reviewed;  acceptable for treatment today. Continue to HOLD Entrectinib for  now.  We will get MRI of the brain prior to next visit.  # Bilateral PE & left lower extremity DVT:  on Xarelto;STABLE.   # Elevated PSA- 7;-MR prostate Biopsy is on hold-given brain mets/active therapy on Avastin-await PSA.  Will discuss with Dr. Stoioff.  # weight loss-unclear etiology.  Referral to dietitian.  #Discussed with patient's wife over the phone.  # DISPOSITION: # referral to Dietician- weight loss # avastin today # follow up in 2 weeks-MD;labs- cbc/cmp/UA; avastin; MRI Brain prior- Dr.B    Orders Placed This Encounter  Procedures  . MR Brain W Wo Contrast    Standing Status:   Future    Standing Expiration Date:   10/17/2020    Order Specific Question:   ** REASON FOR EXAM (FREE TEXT)    Answer:   brain metastases vs radiation necrosis    Order Specific Question:   If indicated for the ordered procedure, I authorize the administration of contrast media per Radiology protocol    Answer:   Yes    Order Specific Question:   What is the patient's sedation requirement?    Answer:   No Sedation    Order Specific Question:   Does the patient have a pacemaker or implanted devices?    Answer:   No    Order Specific Question:   Use SRS Protocol?    Answer:   No    Order Specific Question:   Radiology Contrast Protocol - do NOT remove file path    Answer:   [email or web address]    Order Specific Question:   Preferred imaging location?    Answer:   [facility] (table limit-400lbs)  . CBC with Differential     Standing Status:   Standing    Number of Occurrences:   20    Standing Expiration Date:   10/17/2020  . Comprehensive metabolic panel    Standing Status:   Standing    Number of Occurrences:   20    Standing Expiration Date:   10/17/2020  . Urinalysis, Complete w Microscopic    Standing Status:   Standing    Number of Occurrences:   20    Standing Expiration Date:   10/17/2020   All questions were answered. The patient knows to call the clinic with any problems, questions or concerns.       R , MD 10/18/2019 1:00 PM  

## 2019-10-23 ENCOUNTER — Encounter: Payer: Self-pay | Admitting: Internal Medicine

## 2019-10-28 ENCOUNTER — Ambulatory Visit
Admission: RE | Admit: 2019-10-28 | Discharge: 2019-10-28 | Disposition: A | Discharge status: Home / Self Care | Payer: Medicare Other | Source: Ambulatory Visit | Attending: Internal Medicine | Admitting: Internal Medicine

## 2019-10-28 ENCOUNTER — Other Ambulatory Visit: Payer: Self-pay

## 2019-10-28 DIAGNOSIS — Z23 Encounter for immunization: Secondary | ICD-10-CM | POA: Diagnosis not present

## 2019-10-28 DIAGNOSIS — C3412 Malignant neoplasm of upper lobe, left bronchus or lung: Secondary | ICD-10-CM | POA: Diagnosis not present

## 2019-10-28 DIAGNOSIS — C7931 Secondary malignant neoplasm of brain: Secondary | ICD-10-CM

## 2019-10-28 DIAGNOSIS — C3490 Malignant neoplasm of unspecified part of unspecified bronchus or lung: Secondary | ICD-10-CM | POA: Diagnosis not present

## 2019-10-28 MED ORDER — GADOBUTROL 1 MMOL/ML IV SOLN
6.0000 mL | Freq: Once | INTRAVENOUS | Status: AC | PRN
  Administered 2019-10-28: 6 mL via INTRAVENOUS

## 2019-11-01 ENCOUNTER — Other Ambulatory Visit: Payer: Self-pay

## 2019-11-01 ENCOUNTER — Inpatient Hospital Stay (HOSPITAL_BASED_OUTPATIENT_CLINIC_OR_DEPARTMENT_OTHER): Payer: Medicare Other | Admitting: Internal Medicine

## 2019-11-01 ENCOUNTER — Inpatient Hospital Stay: Payer: Medicare Other

## 2019-11-01 VITALS — BP 132/78 | HR 67 | Resp 18

## 2019-11-01 DIAGNOSIS — Z5112 Encounter for antineoplastic immunotherapy: Secondary | ICD-10-CM | POA: Diagnosis not present

## 2019-11-01 DIAGNOSIS — C3412 Malignant neoplasm of upper lobe, left bronchus or lung: Secondary | ICD-10-CM

## 2019-11-01 DIAGNOSIS — C7931 Secondary malignant neoplasm of brain: Secondary | ICD-10-CM | POA: Diagnosis not present

## 2019-11-01 DIAGNOSIS — R42 Dizziness and giddiness: Secondary | ICD-10-CM | POA: Diagnosis not present

## 2019-11-01 DIAGNOSIS — R634 Abnormal weight loss: Secondary | ICD-10-CM | POA: Diagnosis not present

## 2019-11-01 DIAGNOSIS — R5383 Other fatigue: Secondary | ICD-10-CM | POA: Diagnosis not present

## 2019-11-01 LAB — URINALYSIS, COMPLETE (UACMP) WITH MICROSCOPIC
Bacteria, UA: NONE SEEN
Bilirubin Urine: NEGATIVE
Glucose, UA: NEGATIVE mg/dL
Hgb urine dipstick: NEGATIVE
Ketones, ur: 5 mg/dL — AB
Leukocytes,Ua: NEGATIVE
Nitrite: NEGATIVE
Protein, ur: NEGATIVE mg/dL
Specific Gravity, Urine: 1.019 (ref 1.005–1.030)
Squamous Epithelial / HPF: NONE SEEN (ref 0–5)
pH: 5 (ref 5.0–8.0)

## 2019-11-01 LAB — CBC WITH DIFFERENTIAL/PLATELET
Abs Immature Granulocytes: 0.02 10*3/uL (ref 0.00–0.07)
Basophils Absolute: 0 10*3/uL (ref 0.0–0.1)
Basophils Relative: 1 %
Eosinophils Absolute: 0.2 10*3/uL (ref 0.0–0.5)
Eosinophils Relative: 5 %
HCT: 45.8 % (ref 39.0–52.0)
Hemoglobin: 15.1 g/dL (ref 13.0–17.0)
Immature Granulocytes: 1 %
Lymphocytes Relative: 36 %
Lymphs Abs: 1.6 10*3/uL (ref 0.7–4.0)
MCH: 29.4 pg (ref 26.0–34.0)
MCHC: 33 g/dL (ref 30.0–36.0)
MCV: 89.1 fL (ref 80.0–100.0)
Monocytes Absolute: 0.7 10*3/uL (ref 0.1–1.0)
Monocytes Relative: 16 %
Neutro Abs: 1.9 10*3/uL (ref 1.7–7.7)
Neutrophils Relative %: 41 %
Platelets: 190 10*3/uL (ref 150–400)
RBC: 5.14 MIL/uL (ref 4.22–5.81)
RDW: 13.9 % (ref 11.5–15.5)
WBC: 4.4 10*3/uL (ref 4.0–10.5)
nRBC: 0 % (ref 0.0–0.2)

## 2019-11-01 LAB — COMPREHENSIVE METABOLIC PANEL
ALT: 15 U/L (ref 0–44)
AST: 17 U/L (ref 15–41)
Albumin: 4.1 g/dL (ref 3.5–5.0)
Alkaline Phosphatase: 53 U/L (ref 38–126)
Anion gap: 10 (ref 5–15)
BUN: 11 mg/dL (ref 8–23)
CO2: 23 mmol/L (ref 22–32)
Calcium: 9.7 mg/dL (ref 8.9–10.3)
Chloride: 102 mmol/L (ref 98–111)
Creatinine, Ser: 0.95 mg/dL (ref 0.61–1.24)
GFR calc Af Amer: >60 mL/min (ref >60)
GFR calc non Af Amer: >60 mL/min (ref >60)
Glucose, Bld: 145 mg/dL — ABNORMAL HIGH (ref 70–99)
Potassium: 3.9 mmol/L (ref 3.5–5.1)
Sodium: 135 mmol/L (ref 135–145)
Total Bilirubin: 0.7 mg/dL (ref 0.3–1.2)
Total Protein: 7.4 g/dL (ref 6.5–8.1)

## 2019-11-01 MED ORDER — SODIUM CHLORIDE 0.9 % IV SOLN
700.0000 mg | Freq: Once | INTRAVENOUS | Status: AC
  Administered 2019-11-01: 700 mg via INTRAVENOUS
  Filled 2019-11-01: qty 16

## 2019-11-01 MED ORDER — SODIUM CHLORIDE 0.9 % IV SOLN
Freq: Once | INTRAVENOUS | Status: AC
  Filled 2019-11-01: qty 250

## 2019-11-01 NOTE — Progress Notes (Signed)
[place] OFFICE PROGRESS NOTE  Patient Care Team: [name], [name], MD as Medical Oncologist (Medical Oncology) Cathi Roan, [facility] (Pharmacist)  Cancer Staging No matching staging information was found for the patient.   Oncology History Overview Note  # OCT 2017- ADENO CA LUNG; STAGE IV [; LUL; bil supraclavicular LN; Left neck LN Bx]; ROS-1 MUTATED; s/p Carbo-alimta x1[oct 2017]  # NOV 1st 2017- XALKORI 250 mg BID; JAN 15th CT- PR;  # AUG 27th Chemo-RT to persistent LUL/mediastinal LN [s/p carbo-taxol- with RT; finished Sep 22nd 2018];   # July 14th 2020- multiple metastatic lesions of the brain [July 30 whole brain radiation finished April 21, 2019]; bone scan skull metastases /posterior left ninth rib metastases; April 04, 2019 stopped crizotinib;  #April 25, 2019-start Entrectinib 600 mg once a day; STopped in NOV 2020 [dizziness]; NOV 2020-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   # LLE DVT/bil PE/Multiple strokes [? 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        INTERVAL HISTORY:  Melvin Willis 69 y.o.  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  Patient's dizziness is improved.  No falls.  No headaches.  No nosebleeds.  Denies any bone pain.  Chronic joint pains.  Chronic fatigue.   Review of Systems  Constitutional: Positive for malaise/fatigue. Negative for chills, diaphoresis, fever and weight loss.  HENT: Negative for nosebleeds and sore throat.   Eyes: Negative for double vision.  Respiratory: Negative for cough, hemoptysis, sputum production, shortness of breath and wheezing.   Cardiovascular: Negative for chest pain (Left upper chest wall pain chronic), palpitations and orthopnea.  Gastrointestinal: Negative for abdominal pain, blood in stool, constipation, diarrhea, heartburn and melena.  Genitourinary: Negative for dysuria.  Musculoskeletal: Positive for joint pain. Negative for back pain.  Skin: Negative.  Negative for itching and rash.  Neurological: Negative for tingling, focal weakness and weakness.  Endo/Heme/Allergies: Does not bruise/bleed easily.  Psychiatric/Behavioral: Positive for memory loss. Negative for depression. The patient is not nervous/anxious and does not have insomnia.     PAST MEDICAL HISTORY :  Past Medical History:  Diagnosis Date  . Abnormal prostate specific antigen 08/08/2012  . Adiposity 04/16/2015  . Chronic kidney disease (CKD), stage III (  moderate) 11/27/2016  . CVA (cerebral vascular  accident) ([place]) 06/17/2016  . Diabetes mellitus without complication ([place])   . Diverticulosis of sigmoid colon 04/16/2015  . Dyslipidemia 03/18/2015  . Hemorrhoids, internal 04/16/2015  . Hypercholesteremia 04/16/2015  . Hyperlipidemia   . Hypertension   . Primary cancer of left upper lobe of lung ([place])   . Pulmonary embolism ([place])   . Wears dentures    partial upper    PAST SURGICAL HISTORY :   Past Surgical History:  Procedure Laterality Date  . COLONOSCOPY    . COLONOSCOPY WITH PROPOFOL N/A 05/06/2015   Procedure: COLONOSCOPY WITH PROPOFOL;  Surgeon: Lucilla Lame, MD;  Location: [facility];  Service: Endoscopy;  Laterality: N/A;  ASCENDING COLON POLYPS X 2 TERMINAL ILEUM BIOPSY RANDOM COLON BX. TRANSVERSE COLON POLYP SIGMOID COLON POLYP  . ESOPHAGOGASTRODUODENOSCOPY (EGD) WITH PROPOFOL N/A 05/06/2015   Procedure: ESOPHAGOGASTRODUODENOSCOPY (EGD) WITH PROPOFOL;  Surgeon: Lucilla Lame, MD;  Location: [facility];  Service: Endoscopy;  Laterality: N/A;  GASTRIC BIOPSY X1  . KIDNEY STONE SURGERY  2017    FAMILY HISTORY :   Family History  Problem Relation Age of Onset  . Diabetes Mother   . Diabetes Father   . CAD Father   . Dementia Father   . Diabetes Sister   . Cancer Maternal Uncle        Prostate  . Cancer Cousin        prostate    SOCIAL HISTORY:   Social History   Tobacco Use  . Smoking status: Never Smoker  . Smokeless tobacco: Never Used  Substance Use Topics  . Alcohol use: No    Alcohol/week: 0.0 standard drinks  . Drug use: No    ALLERGIES:  has No Known Allergies.  MEDICATIONS:  Current Outpatient Medications  Medication Sig Dispense Refill  . atorvastatin (LIPITOR) 40 MG tablet Take 1 tablet (40 mg total) by mouth 3 (three) times a week. 48 tablet 1  . blood glucose meter kit and supplies Dispense based on patient and insurance preference (Accuchek Aviva Plus, Accuchek Nano). Use up to four times daily as directed. (FOR ICD-10 E10.9,  E11.9). 1 each 0  . fluticasone (FLONASE) 50 MCG/ACT nasal spray Place 2 sprays into both nostrils daily. 16 g 2  . insulin degludec (TRESIBA FLEXTOUCH) 100 UNIT/ML SOPN FlexTouch Pen Inject 0.1 mLs (10 Units total) into the skin daily. 15 mL 0  . metFORMIN (GLUCOPHAGE) 500 MG tablet Take 1 tablet (500 mg total) by mouth 3 (three) times daily. 270 tablet 0  . tamsulosin (FLOMAX) 0.4 MG CAPS capsule Take 1 capsule (0.4 mg total) by mouth every morning. 90 capsule 3  . XARELTO 20 MG TABS tablet TAKE 1 TABLET BY MOUTH ONCE DAILY WITH SUPPER 30 tablet 0  . entrectinib (ROZLYTREK) 200 MG capsule Take 3 capsules (600 mg total) by mouth daily. (Patient not taking: Reported on 10/17/2019) 90 capsule 6  . guaiFENesin (MUCINEX) 600 MG 12 hr tablet Take 1 tablet (600 mg total) by mouth 2 (two) times daily. (Patient not taking: Reported on 10/17/2019) 20 tablet 0  . ondansetron (ZOFRAN) 8 MG tablet One pill every 8 hours as needed for nausea/vomitting. (Patient not taking: Reported on 11/01/2019) 40 tablet 1   No current facility-administered medications for this visit.   Facility-Administered Medications Ordered in Other Visits  Medication Dose Route Frequency Provider Last Rate Last Admin  . bevacizumab-awwb (MVASI) 700 mg in sodium chloride 0.9 % 100 mL chemo  infusion  700 mg Intravenous Once Cammie Sickle, MD 384 mL/hr at 11/01/19 1023 700 mg at 11/01/19 1023    PHYSICAL EXAMINATION: ECOG PERFORMANCE STATUS: 0 - Asymptomatic  BP (!) 125/98 (BP Location: Left Arm, Patient Position: Sitting, Cuff Size: Normal)   Pulse 90   Temp (!) 97.1 F (36.2 C) (Tympanic)   Wt 144 lb (65.3 kg)   SpO2 100%   BMI 24.72 kg/m   Filed Weights   11/01/19 0857  Weight: 144 lb (65.3 kg)    Physical Exam  Constitutional: He is oriented to person, place, and time and well-developed, well-nourished, and in no distress.  Is alone.  He is walking by himself.  HENT:  Head: Normocephalic and atraumatic.   Mouth/Throat: Oropharynx is clear and moist. No oropharyngeal exudate.  Eyes: Pupils are equal, round, and reactive to light.  Cardiovascular: Normal rate and regular rhythm.  Pulmonary/Chest: No respiratory distress. He has no wheezes.  Abdominal: Soft. Bowel sounds are normal. He exhibits no distension and no mass. There is no abdominal tenderness. There is no rebound and no guarding.  Musculoskeletal:        General: No tenderness or edema. Normal range of motion.     Cervical back: Normal range of motion and neck supple.  Neurological: He is alert and oriented to person, place, and time.  Skin: Skin is warm.  Psychiatric: Affect normal.   LABORATORY DATA:  I have reviewed the data as listed    Component Value Date/Time   NA 135 11/01/2019 0840   NA 139 01/01/2016 1000   K 3.9 11/01/2019 0840   CL 102 11/01/2019 0840   CO2 23 11/01/2019 0840   GLUCOSE 145 (H) 11/01/2019 0840   BUN 11 11/01/2019 0840   BUN 13 01/01/2016 1000   CREATININE 0.95 11/01/2019 0840   CREATININE 1.32 (H) 08/22/2018 1107   CALCIUM 9.7 11/01/2019 0840   PROT 7.4 11/01/2019 0840   PROT 7.2 01/01/2016 1000   ALBUMIN 4.1 11/01/2019 0840   ALBUMIN 4.4 01/01/2016 1000   AST 17 11/01/2019 0840   ALT 15 11/01/2019 0840   ALKPHOS 53 11/01/2019 0840   BILITOT 0.7 11/01/2019 0840   BILITOT 0.4 01/01/2016 1000   GFRNONAA >60 11/01/2019 0840   GFRNONAA 56 (L) 08/22/2018 1107   GFRAA >60 11/01/2019 0840   GFRAA 65 08/22/2018 1107    No results found for: SPEP, UPEP  Lab Results  Component Value Date   WBC 4.4 11/01/2019   NEUTROABS 1.9 11/01/2019   HGB 15.1 11/01/2019   HCT 45.8 11/01/2019   MCV 89.1 11/01/2019   PLT 190 11/01/2019      Chemistry      Component Value Date/Time   NA 135 11/01/2019 0840   NA 139 01/01/2016 1000   K 3.9 11/01/2019 0840   CL 102 11/01/2019 0840   CO2 23 11/01/2019 0840   BUN 11 11/01/2019 0840   BUN 13 01/01/2016 1000   CREATININE 0.95 11/01/2019 0840    CREATININE 1.32 (H) 08/22/2018 1107      Component Value Date/Time   CALCIUM 9.7 11/01/2019 0840   ALKPHOS 53 11/01/2019 0840   AST 17 11/01/2019 0840   ALT 15 11/01/2019 0840   BILITOT 0.7 11/01/2019 0840   BILITOT 0.4 01/01/2016 1000       RADIOGRAPHIC STUDIES: I have personally reviewed the radiological images as listed and agreed with the findings in the report. No results found.   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 On avastin #5; MRI FEB 21st 2021- IMPROVED left temporal lesion/stable 4 lesions.   # Proceed with avastin cycle #6  today. Labs today reviewed;  acceptable for treatment today. Will plan to re-start Rozyltrek at next visit.   # Bilateral PE & left lower extremity DVT:  on Xarelto; stable; will need to be off prior to prostate biopsy.   # Elevated PSA- 7; rising- FEB 2021- 12.  Given the improvement of the metastatic disease in the brain; will need follow up with Dr.Stoiff regarding prostate biopsy.  Discussed with urology; will refer back for bx.   # DISPOSITION: # avastin today # follow up in 2 weeks-MD;labs- cbc/cmp; No chemo;-  Dr.B    No orders of the defined types were placed in this encounter.  All questions were answered. The patient knows to call the clinic with any problems, questions or concerns.      Cammie Sickle, MD 11/01/2019 10:26 AM

## 2019-11-01 NOTE — Patient Instructions (Signed)
[place] office re: rising PSA; need for prostate biopsy.

## 2019-11-01 NOTE — Assessment & Plan Note (Addendum)
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 On avastin #5; MRI FEB 21st 2021- IMPROVED left temporal lesion/stable 4 lesions.   # Proceed with avastin cycle #6  today. Labs today reviewed;  acceptable for treatment today. Will plan to re-start Rozyltrek at next visit.   # Bilateral PE & left lower extremity DVT:  on Xarelto; stable; will need to be off prior to prostate biopsy.   # Elevated PSA- 7; rising- FEB 2021- 12.  Given the improvement of the metastatic disease in the brain; will need follow up with Dr.Stoiff regarding prostate biopsy.  Discussed with urology; will refer back for bx.   # DISPOSITION: # avastin today # follow up in 2 weeks-MD;labs- cbc/cmp; No chemo;-  Dr.B

## 2019-11-02 ENCOUNTER — Telehealth (INDEPENDENT_AMBULATORY_CARE_PROVIDER_SITE_OTHER): Payer: Medicare Other | Admitting: Urology

## 2019-11-02 DIAGNOSIS — R972 Elevated prostate specific antigen [PSA]: Secondary | ICD-10-CM

## 2019-11-02 NOTE — Progress Notes (Signed)
Virtual Visit via Telephone Note  I connected with Melvin Willis on 11/02/19 at  2:15 PM EST by telephone and verified that I am speaking with the correct person using two identifiers.  Location: Patient: Home Provider: [facility] office   I discussed the limitations, risks, security and privacy concerns of performing an evaluation and management service by telephone and the availability of in person appointments. I also discussed with the patient that there may be a patient responsible charge related to this service. The patient expressed understanding and agreed to proceed.   History of Present Illness: 69 y.o. male with an elevated PSA.  PSA has been in the 7-8 range.  A prostate MRI showed a PI-RADS 4 lesion in the left PZ mid gland and PI-RADS 3 lesion in the right PZ.  He is followed in medical oncology for metastatic lung cancer to the brain and Dr. Rogue Bussing did not recommend proceeding with prostate biopsy.  A PSA performed earlier this month had increased to 12.17.  Recent MRI showed response to current therapy and based on his PSA rise Dr. Rogue Bussing recommended proceeding with biopsy.   Observations/Objective: N/A  Assessment and Plan: 69 y.o. male with rising PSA and abnormal prostate MRI.  He has lung cancer with brain metastasis however has responded to current therapy.  Discussed prostate biopsy with the patient and he desires to proceed.  I recommended a cognitive biopsy and lieu of MR fusion.  Follow Up Instructions: Will schedule prostate biopsy and coordinate holding his anticoagulant with Dr. Rogue Bussing   I discussed the assessment and treatment plan with the patient. The patient was provided an opportunity to ask questions and all were answered. The patient agreed with the plan and demonstrated an understanding of the instructions.   The patient was advised to call back or seek an in-person evaluation if the symptoms worsen or if the condition fails  to improve as anticipated.  I provided 12 minutes of non-face-to-face time during this encounter.   Abbie Sons, MD

## 2019-11-03 ENCOUNTER — Other Ambulatory Visit: Payer: Self-pay

## 2019-11-06 ENCOUNTER — Inpatient Hospital Stay: Payer: Medicare Other | Attending: Internal Medicine

## 2019-11-06 DIAGNOSIS — Z923 Personal history of irradiation: Secondary | ICD-10-CM | POA: Insufficient documentation

## 2019-11-06 DIAGNOSIS — Z833 Family history of diabetes mellitus: Secondary | ICD-10-CM | POA: Insufficient documentation

## 2019-11-06 DIAGNOSIS — C3412 Malignant neoplasm of upper lobe, left bronchus or lung: Secondary | ICD-10-CM | POA: Insufficient documentation

## 2019-11-06 DIAGNOSIS — C7951 Secondary malignant neoplasm of bone: Secondary | ICD-10-CM | POA: Insufficient documentation

## 2019-11-06 DIAGNOSIS — Z9079 Acquired absence of other genital organ(s): Secondary | ICD-10-CM | POA: Insufficient documentation

## 2019-11-06 DIAGNOSIS — Z86010 Personal history of colonic polyps: Secondary | ICD-10-CM | POA: Insufficient documentation

## 2019-11-06 DIAGNOSIS — Z86711 Personal history of pulmonary embolism: Secondary | ICD-10-CM | POA: Insufficient documentation

## 2019-11-06 DIAGNOSIS — E1122 Type 2 diabetes mellitus with diabetic chronic kidney disease: Secondary | ICD-10-CM | POA: Insufficient documentation

## 2019-11-06 DIAGNOSIS — Z8719 Personal history of other diseases of the digestive system: Secondary | ICD-10-CM | POA: Insufficient documentation

## 2019-11-06 DIAGNOSIS — N183 Chronic kidney disease, stage 3 unspecified: Secondary | ICD-10-CM | POA: Insufficient documentation

## 2019-11-06 DIAGNOSIS — Z7901 Long term (current) use of anticoagulants: Secondary | ICD-10-CM | POA: Insufficient documentation

## 2019-11-06 DIAGNOSIS — Z79899 Other long term (current) drug therapy: Secondary | ICD-10-CM | POA: Insufficient documentation

## 2019-11-06 DIAGNOSIS — Z8042 Family history of malignant neoplasm of prostate: Secondary | ICD-10-CM | POA: Insufficient documentation

## 2019-11-06 DIAGNOSIS — E785 Hyperlipidemia, unspecified: Secondary | ICD-10-CM | POA: Insufficient documentation

## 2019-11-06 DIAGNOSIS — Z8249 Family history of ischemic heart disease and other diseases of the circulatory system: Secondary | ICD-10-CM | POA: Insufficient documentation

## 2019-11-06 DIAGNOSIS — R972 Elevated prostate specific antigen [PSA]: Secondary | ICD-10-CM | POA: Insufficient documentation

## 2019-11-06 DIAGNOSIS — Z8673 Personal history of transient ischemic attack (TIA), and cerebral infarction without residual deficits: Secondary | ICD-10-CM | POA: Insufficient documentation

## 2019-11-06 DIAGNOSIS — R5383 Other fatigue: Secondary | ICD-10-CM | POA: Insufficient documentation

## 2019-11-06 DIAGNOSIS — Z818 Family history of other mental and behavioral disorders: Secondary | ICD-10-CM | POA: Insufficient documentation

## 2019-11-06 DIAGNOSIS — C7931 Secondary malignant neoplasm of brain: Secondary | ICD-10-CM | POA: Insufficient documentation

## 2019-11-06 DIAGNOSIS — Z86718 Personal history of other venous thrombosis and embolism: Secondary | ICD-10-CM | POA: Insufficient documentation

## 2019-11-06 NOTE — Progress Notes (Signed)
Nutrition Follow-up:  Patient with stage IV adenocarcinoma of the lung with metastatic diease to brain.  Patient followed by Dr. B and receiving avastin.    Spoke with patient via phone.  Patient reports that his appetite is better and has been better over the past 2 weeks.  Reports that he usually eats eggs, bacon, toast, applesauce, juice for breakfast.  Has crackers and sometimes ensure shake at 1-2pm and dinner might be burger and fries.  Reports that he thinks ensure made him have diarrhea.  Reports has a hard time chewing meats as feels like if he swallows it he will throw up.  Not taking any nausea medications.     Medications: zofran but not taking  Labs: reviewed  Anthropometrics:   Weight 144 lb on 2/24 decreased from 147 lb  But increase from 142 lb   NUTRITION DIAGNOSIS: Inadequate oral intake continues   INTERVENTION:  Discussed with patient taking nausea medications.   Talked again about items he can add mid-day.  Patient may benefit from trial of appetite stimulant. Patient has contact information    MONITORING, EVALUATION, GOAL: Patient will consume adequate calories and protein to maintain weight   NEXT VISIT: March 29th, phone  Jesse Hirst B. Zenia Resides, [place], [place] Registered Dietitian [phone] (pager)

## 2019-11-07 ENCOUNTER — Inpatient Hospital Stay (HOSPITAL_BASED_OUTPATIENT_CLINIC_OR_DEPARTMENT_OTHER): Payer: Medicare Other | Admitting: Hospice and Palliative Medicine

## 2019-11-07 ENCOUNTER — Other Ambulatory Visit: Payer: Self-pay

## 2019-11-07 DIAGNOSIS — C3412 Malignant neoplasm of upper lobe, left bronchus or lung: Secondary | ICD-10-CM

## 2019-11-07 DIAGNOSIS — Z515 Encounter for palliative care: Secondary | ICD-10-CM | POA: Diagnosis not present

## 2019-11-07 NOTE — Progress Notes (Signed)
Virtual Visit via Telephone Note  I connected with Melvin Willis on 11/07/19 at 11:00 AM EST by telephone and verified that I am speaking with the correct person using two identifiers.   I discussed the limitations, risks, security and privacy concerns of performing an evaluation and management service by telephone and the availability of in person appointments. I also discussed with the patient that there may be a patient responsible charge related to this service. The patient expressed understanding and agreed to proceed.   History of Present Illness: Melvin Willis is a 69 y.o. 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  Patient is status post whole brain radiation and is currently on treatment with Avastin.  MRI of the brain on 07/25/2019 revealed progression of enhancing lesion in the left temporal lobe concerning for either radiation necrosis or progressive metastatic disease.  Patient was referred to palliative care to help address goals and manage ongoing symptoms.   Observations/Objective: I called spoke with patient by phone.    Patient reports he is doing well.  He denies any significant changes or concerns.  No distressing symptoms were reported today.  Patient reports improved oral intake over the past several weeks.  He is sleeping well at night.  No changes in performance status noted.  Patient says he is coping well.  Assessment and Plan: Stage IV lung cancer -currently on treatment with Avastin.  Has follow-up on 3/10 with Dr. Rogue Bussing  Weight loss -follow weight trends.  Appreciate assistance by RD.  Patient reports improved oral intake.  ACP -patient discussing decision making with his family  Follow Up Instructions: Follow-up telephone visit in about 3 weeks   I discussed the assessment and treatment plan with the patient. The patient was provided an opportunity to ask questions and all were  answered. The patient agreed with the plan and demonstrated an understanding of the instructions.   The patient was advised to call back or seek an in-person evaluation if the symptoms worsen or if the condition fails to improve as anticipated.  I provided 5 minutes of non-face-to-face time during this encounter.   Irean Hong, NP

## 2019-11-15 ENCOUNTER — Other Ambulatory Visit: Payer: Self-pay | Admitting: *Deleted

## 2019-11-15 ENCOUNTER — Inpatient Hospital Stay (HOSPITAL_BASED_OUTPATIENT_CLINIC_OR_DEPARTMENT_OTHER): Payer: Medicare Other | Admitting: Internal Medicine

## 2019-11-15 ENCOUNTER — Other Ambulatory Visit: Payer: Self-pay

## 2019-11-15 ENCOUNTER — Inpatient Hospital Stay: Payer: Medicare Other

## 2019-11-15 DIAGNOSIS — Z86010 Personal history of colonic polyps: Secondary | ICD-10-CM | POA: Diagnosis not present

## 2019-11-15 DIAGNOSIS — C7931 Secondary malignant neoplasm of brain: Secondary | ICD-10-CM | POA: Diagnosis not present

## 2019-11-15 DIAGNOSIS — C3412 Malignant neoplasm of upper lobe, left bronchus or lung: Secondary | ICD-10-CM

## 2019-11-15 DIAGNOSIS — Z8719 Personal history of other diseases of the digestive system: Secondary | ICD-10-CM | POA: Diagnosis not present

## 2019-11-15 DIAGNOSIS — C7951 Secondary malignant neoplasm of bone: Secondary | ICD-10-CM | POA: Diagnosis not present

## 2019-11-15 DIAGNOSIS — N183 Chronic kidney disease, stage 3 unspecified: Secondary | ICD-10-CM | POA: Diagnosis not present

## 2019-11-15 DIAGNOSIS — Z8673 Personal history of transient ischemic attack (TIA), and cerebral infarction without residual deficits: Secondary | ICD-10-CM | POA: Diagnosis not present

## 2019-11-15 DIAGNOSIS — E1122 Type 2 diabetes mellitus with diabetic chronic kidney disease: Secondary | ICD-10-CM | POA: Diagnosis not present

## 2019-11-15 DIAGNOSIS — Z86711 Personal history of pulmonary embolism: Secondary | ICD-10-CM | POA: Diagnosis not present

## 2019-11-15 DIAGNOSIS — Z833 Family history of diabetes mellitus: Secondary | ICD-10-CM | POA: Diagnosis not present

## 2019-11-15 DIAGNOSIS — Z8042 Family history of malignant neoplasm of prostate: Secondary | ICD-10-CM | POA: Diagnosis not present

## 2019-11-15 DIAGNOSIS — Z818 Family history of other mental and behavioral disorders: Secondary | ICD-10-CM | POA: Diagnosis not present

## 2019-11-15 DIAGNOSIS — Z79899 Other long term (current) drug therapy: Secondary | ICD-10-CM | POA: Diagnosis not present

## 2019-11-15 DIAGNOSIS — Z923 Personal history of irradiation: Secondary | ICD-10-CM | POA: Diagnosis not present

## 2019-11-15 DIAGNOSIS — Z9079 Acquired absence of other genital organ(s): Secondary | ICD-10-CM | POA: Diagnosis not present

## 2019-11-15 DIAGNOSIS — R972 Elevated prostate specific antigen [PSA]: Secondary | ICD-10-CM | POA: Diagnosis not present

## 2019-11-15 DIAGNOSIS — Z8249 Family history of ischemic heart disease and other diseases of the circulatory system: Secondary | ICD-10-CM | POA: Diagnosis not present

## 2019-11-15 DIAGNOSIS — Z7901 Long term (current) use of anticoagulants: Secondary | ICD-10-CM | POA: Diagnosis not present

## 2019-11-15 DIAGNOSIS — Z86718 Personal history of other venous thrombosis and embolism: Secondary | ICD-10-CM | POA: Diagnosis not present

## 2019-11-15 DIAGNOSIS — E785 Hyperlipidemia, unspecified: Secondary | ICD-10-CM | POA: Diagnosis not present

## 2019-11-15 DIAGNOSIS — R5383 Other fatigue: Secondary | ICD-10-CM | POA: Diagnosis not present

## 2019-11-15 LAB — CBC WITH DIFFERENTIAL/PLATELET
Abs Immature Granulocytes: 0.01 10*3/uL (ref 0.00–0.07)
Basophils Absolute: 0 10*3/uL (ref 0.0–0.1)
Basophils Relative: 1 %
Eosinophils Absolute: 0.2 10*3/uL (ref 0.0–0.5)
Eosinophils Relative: 3 %
HCT: 48.3 % (ref 39.0–52.0)
Hemoglobin: 15.5 g/dL (ref 13.0–17.0)
Immature Granulocytes: 0 %
Lymphocytes Relative: 30 %
Lymphs Abs: 1.5 10*3/uL (ref 0.7–4.0)
MCH: 28.4 pg (ref 26.0–34.0)
MCHC: 32.1 g/dL (ref 30.0–36.0)
MCV: 88.5 fL (ref 80.0–100.0)
Monocytes Absolute: 0.6 10*3/uL (ref 0.1–1.0)
Monocytes Relative: 13 %
Neutro Abs: 2.6 10*3/uL (ref 1.7–7.7)
Neutrophils Relative %: 53 %
Platelets: 233 10*3/uL (ref 150–400)
RBC: 5.46 MIL/uL (ref 4.22–5.81)
RDW: 14 % (ref 11.5–15.5)
WBC: 4.9 10*3/uL (ref 4.0–10.5)
nRBC: 0 % (ref 0.0–0.2)

## 2019-11-15 LAB — URINALYSIS, COMPLETE (UACMP) WITH MICROSCOPIC
Bacteria, UA: NONE SEEN
Bilirubin Urine: NEGATIVE
Glucose, UA: NEGATIVE mg/dL
Hgb urine dipstick: NEGATIVE
Ketones, ur: NEGATIVE mg/dL
Leukocytes,Ua: NEGATIVE
Nitrite: NEGATIVE
Protein, ur: NEGATIVE mg/dL
Specific Gravity, Urine: 1.016 (ref 1.005–1.030)
Squamous Epithelial / HPF: NONE SEEN (ref 0–5)
pH: 5 (ref 5.0–8.0)

## 2019-11-15 LAB — COMPREHENSIVE METABOLIC PANEL
ALT: 14 U/L (ref 0–44)
AST: 14 U/L — ABNORMAL LOW (ref 15–41)
Albumin: 4.1 g/dL (ref 3.5–5.0)
Alkaline Phosphatase: 50 U/L (ref 38–126)
Anion gap: 10 (ref 5–15)
BUN: 10 mg/dL (ref 8–23)
CO2: 22 mmol/L (ref 22–32)
Calcium: 9.6 mg/dL (ref 8.9–10.3)
Chloride: 104 mmol/L (ref 98–111)
Creatinine, Ser: 0.91 mg/dL (ref 0.61–1.24)
GFR calc Af Amer: >60 mL/min (ref >60)
GFR calc non Af Amer: >60 mL/min (ref >60)
Glucose, Bld: 110 mg/dL — ABNORMAL HIGH (ref 70–99)
Potassium: 4.2 mmol/L (ref 3.5–5.1)
Sodium: 136 mmol/L (ref 135–145)
Total Bilirubin: 0.8 mg/dL (ref 0.3–1.2)
Total Protein: 7.3 g/dL (ref 6.5–8.1)

## 2019-11-15 MED ORDER — ENTRECTINIB 200 MG PO CAPS: 600.0000 mg | ORAL_CAPSULE | Freq: Every day | ORAL | 6 refills | Status: DC

## 2019-11-15 NOTE — Progress Notes (Signed)
[place] OFFICE PROGRESS NOTE  Patient Care Team: [name], [name], MD as Medical Oncologist (Medical Oncology) Cathi Roan, [facility] (Pharmacist)  Cancer Staging No matching staging information was found for the patient.   Oncology History Overview Note  # OCT 2017- ADENO CA LUNG; STAGE IV [; LUL; bil supraclavicular LN; Left neck LN Bx]; ROS-1 MUTATED; s/p Carbo-alimta x1[oct 2017]  # NOV 1st 2017- XALKORI 250 mg BID; JAN 15th CT- PR;  # AUG 27th Chemo-RT to persistent LUL/mediastinal LN [s/p carbo-taxol- with RT; finished Sep 22nd 2018];   # July 14th 2020- multiple metastatic lesions of the brain [July 30 whole brain radiation finished April 21, 2019]; bone scan skull metastases /posterior left ninth rib metastases; April 04, 2019 stopped crizotinib;  #April 25, 2019-start Entrectinib 600 mg once a day; STopped in NOV 2020 [dizziness]; NOV 2020-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   # LLE DVT/bil PE/Multiple strokes [? 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        INTERVAL HISTORY:  Melvin Willis 69 y.o.  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  Patient in interim was evaluated by urology for his rising PSA.  He scheduled for have a biopsy on March 18.  Patient is nervous about his biopsy.  Otherwise denies any falls but denies any headaches.  Denies any nausea vomiting.    Review of Systems  Constitutional: Positive for malaise/fatigue. Negative for chills, diaphoresis, fever and weight loss.  HENT: Negative for nosebleeds and sore throat.   Eyes: Negative for double vision.  Respiratory: Negative for cough, hemoptysis, sputum production, shortness of breath and wheezing.   Cardiovascular: Negative for chest pain (Left upper chest wall pain chronic), palpitations and orthopnea.  Gastrointestinal: Negative for abdominal pain, blood in stool, constipation, diarrhea, heartburn and melena.  Genitourinary: Negative for dysuria.  Musculoskeletal: Positive for joint pain. Negative for back pain.  Skin: Negative.  Negative for itching and rash.  Neurological: Negative for tingling, focal weakness and weakness.  Endo/Heme/Allergies: Does not bruise/bleed easily.  Psychiatric/Behavioral: Positive for memory loss. Negative for depression. The patient is not nervous/anxious and does not have insomnia.     PAST MEDICAL HISTORY :  Past Medical History:  Diagnosis Date  .  Abnormal prostate specific antigen 08/08/2012  .  Adiposity 04/16/2015  . Chronic kidney disease (CKD), stage III (moderate) 11/27/2016  . CVA (cerebral vascular accident) ([place]) 06/17/2016  . Diabetes mellitus without complication ([place])   . Diverticulosis of sigmoid colon 04/16/2015  . Dyslipidemia 03/18/2015  . Hemorrhoids, internal 04/16/2015  . Hypercholesteremia 04/16/2015  . Hyperlipidemia   . Hypertension   . Primary cancer of left upper lobe of lung ([place])   . Pulmonary embolism ([place])   . Wears dentures    partial upper    PAST SURGICAL HISTORY :   Past Surgical History:  Procedure Laterality Date  . COLONOSCOPY    . COLONOSCOPY WITH PROPOFOL N/A 05/06/2015   Procedure: COLONOSCOPY WITH PROPOFOL;  Surgeon: Lucilla Lame, MD;  Location: [place];  Service: Endoscopy;  Laterality: N/A;  ASCENDING COLON POLYPS X 2 TERMINAL ILEUM BIOPSY RANDOM COLON BX. TRANSVERSE COLON POLYP SIGMOID COLON POLYP  . ESOPHAGOGASTRODUODENOSCOPY (EGD) WITH PROPOFOL N/A 05/06/2015   Procedure: ESOPHAGOGASTRODUODENOSCOPY (EGD) WITH PROPOFOL;  Surgeon: Lucilla Lame, MD;  Location: [place];  Service: Endoscopy;  Laterality: N/A;  GASTRIC BIOPSY X1  . KIDNEY STONE SURGERY  2017    FAMILY HISTORY :   Family History  Problem Relation Age of Onset  . Diabetes Mother   . Diabetes Father   . CAD Father   . Dementia Father   . Diabetes Sister   . Cancer Maternal Uncle        Prostate  . Cancer Cousin        prostate    SOCIAL HISTORY:   Social History   Tobacco Use  . Smoking status: Never Smoker  . Smokeless tobacco: Never Used  Substance Use Topics  . Alcohol use: No    Alcohol/week: 0.0 standard drinks  . Drug use: No    ALLERGIES:  has No Known Allergies.  MEDICATIONS:  Current Outpatient Medications  Medication Sig Dispense Refill  . atorvastatin (LIPITOR) 40 MG tablet Take 1 tablet (40 mg total) by mouth 3 (three) times a week. 48 tablet 1  . blood glucose meter kit and supplies Dispense based on patient and insurance  preference (Accuchek Aviva Plus, Accuchek Nano). Use up to four times daily as directed. (FOR ICD-10 E10.9, E11.9). 1 each 0  . fluticasone (FLONASE) 50 MCG/ACT nasal spray Place 2 sprays into both nostrils daily. 16 g 2  . insulin degludec (TRESIBA FLEXTOUCH) 100 UNIT/ML SOPN FlexTouch Pen Inject 0.1 mLs (10 Units total) into the skin daily. 15 mL 0  . metFORMIN (GLUCOPHAGE) 500 MG tablet Take 1 tablet (500 mg total) by mouth 3 (three) times daily. 270 tablet 0  . ondansetron (ZOFRAN) 8 MG tablet One pill every 8 hours as needed for nausea/vomitting. 40 tablet 1  . tamsulosin (FLOMAX) 0.4 MG CAPS capsule Take 1 capsule (0.4 mg total) by mouth every morning. 90 capsule 3  . XARELTO 20 MG TABS tablet TAKE 1 TABLET BY MOUTH ONCE DAILY WITH SUPPER 30 tablet 0  . entrectinib (ROZLYTREK) 200 MG capsule Take 3 capsules (600 mg total) by mouth daily. (Patient not taking: Reported on 10/17/2019) 90 capsule 6  . guaiFENesin (MUCINEX) 600 MG 12 hr tablet Take 1 tablet (600 mg total) by mouth 2 (two) times daily. (Patient not taking: Reported on 10/17/2019) 20 tablet 0   No current facility-administered medications for this visit.    PHYSICAL EXAMINATION: ECOG PERFORMANCE STATUS: 0 - Asymptomatic  BP (!) 143/93 Comment: recheck  Pulse 88  Temp (!) 97.3 F (36.3 C) (Tympanic)   Resp 20   Ht _0  (1.626 m)   Wt 142 lb (64.4 kg)   BMI 24.37 kg/m   Filed Weights   11/15/19 1056  Weight: 142 lb (64.4 kg)    Physical Exam  Constitutional: He is oriented to person, place, and time and well-developed, well-nourished, and in no distress.  Is alone.  He is walking by himself.  HENT:  Head: Normocephalic and atraumatic.  Mouth/Throat: Oropharynx is clear and moist. No oropharyngeal exudate.  Eyes: Pupils are equal, round, and reactive to light.  Cardiovascular: Normal rate and regular rhythm.  Pulmonary/Chest: No respiratory distress. He has no wheezes.  Abdominal: Soft. Bowel sounds are normal. He  exhibits no distension and no mass. There is no abdominal tenderness. There is no rebound and no guarding.  Musculoskeletal:        General: No tenderness or edema. Normal range of motion.     Cervical back: Normal range of motion and neck supple.  Neurological: He is alert and oriented to person, place, and time.  Skin: Skin is warm.  Psychiatric: Affect normal.   LABORATORY DATA:  I have reviewed the data as listed    Component Value Date/Time   NA 136 11/15/2019 1029   NA 139 01/01/2016 1000   K 4.2 11/15/2019 1029   CL 104 11/15/2019 1029   CO2 22 11/15/2019 1029   GLUCOSE 110 (H) 11/15/2019 1029   BUN 10 11/15/2019 1029   BUN 13 01/01/2016 1000   CREATININE 0.91 11/15/2019 1029   CREATININE 1.32 (H) 08/22/2018 1107   CALCIUM 9.6 11/15/2019 1029   PROT 7.3 11/15/2019 1029   PROT 7.2 01/01/2016 1000   ALBUMIN 4.1 11/15/2019 1029   ALBUMIN 4.4 01/01/2016 1000   AST 14 (L) 11/15/2019 1029   ALT 14 11/15/2019 1029   ALKPHOS 50 11/15/2019 1029   BILITOT 0.8 11/15/2019 1029   BILITOT 0.4 01/01/2016 1000   GFRNONAA >60 11/15/2019 1029   GFRNONAA 56 (L) 08/22/2018 1107   GFRAA >60 11/15/2019 1029   GFRAA 65 08/22/2018 1107    No results found for: SPEP, UPEP  Lab Results  Component Value Date   WBC 4.9 11/15/2019   NEUTROABS 2.6 11/15/2019   HGB 15.5 11/15/2019   HCT 48.3 11/15/2019   MCV 88.5 11/15/2019   PLT 233 11/15/2019      Chemistry      Component Value Date/Time   NA 136 11/15/2019 1029   NA 139 01/01/2016 1000   K 4.2 11/15/2019 1029   CL 104 11/15/2019 1029   CO2 22 11/15/2019 1029   BUN 10 11/15/2019 1029   BUN 13 01/01/2016 1000   CREATININE 0.91 11/15/2019 1029   CREATININE 1.32 (H) 08/22/2018 1107      Component Value Date/Time   CALCIUM 9.6 11/15/2019 1029   ALKPHOS 50 11/15/2019 1029   AST 14 (L) 11/15/2019 1029   ALT 14 11/15/2019 1029   BILITOT 0.8 11/15/2019 1029   BILITOT 0.4 01/01/2016 1000       RADIOGRAPHIC STUDIES: I have  personally reviewed the radiological images as listed and agreed with the findings in the report. No results found.   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 S/p AVASTIN x 6-February 2021 -improved  MRI brain lesions.  # Re-start 2 pills Rozyltek [if tolerating well; will for increase]; this should not have any major contraindications to his prostate biopsy.  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  will need to be off prior to prostate biopsy [3/18].  See recommendations below; this was discussed and related to the patient.  Patient will not need to be on Lovenox bridge.  # Elevated PSA- 7; rising- FEB 2021- 12; awaiting Bx on 3/18. #Stop Xarelto prior to prostate biopsy; do not take Xarelto starting March 15th.  Restart Xarelto on March 21 if no bleeding noted.  # DISPOSITION: # follow up in 3 weeks-MD;labs- cbc/cmp- Dr.B    Orders Placed This Encounter  Procedures  . CBC with Differential    Standing Status:   Future    Standing Expiration Date:   11/14/2020  . Comprehensive metabolic panel    Standing Status:   Future    Standing Expiration Date:   11/14/2020   All questions were answered. The patient knows to call the clinic with any problems, questions or concerns.      Cammie Sickle, MD 11/15/2019 11:44 AM

## 2019-11-15 NOTE — Assessment & Plan Note (Addendum)
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 S/p AVASTIN x 6-February 2021 -improved MRI brain lesions.  # Re-start 2 pills Rozyltek [if tolerating well; will for increase]; this should not have any major contraindications to his prostate biopsy.  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  will need to be off prior to prostate biopsy [3/18].  See recommendations below; this was discussed and related to the patient.  Patient will not need to be on Lovenox bridge.  # Elevated PSA- 7; rising- FEB 2021- 12; awaiting Bx on 3/18. #Stop Xarelto prior to prostate biopsy; do not take Xarelto starting March 15th.  Restart Xarelto on March 21 if no bleeding noted.  # DISPOSITION: # follow up in 3 weeks-MD;labs- cbc/cmp- Dr.B

## 2019-11-15 NOTE — Patient Instructions (Signed)
#  Stop Xarelto prior to prostate biopsy; do not take Xarelto starting March 15th.  Restart Xarelto on March 21 if no bleeding noted.  #Start taking cancer pill [rozyltrek] 2 pills a day-starting today.

## 2019-11-16 ENCOUNTER — Telehealth: Payer: Self-pay | Admitting: Pharmacist

## 2019-11-16 DIAGNOSIS — C3412 Malignant neoplasm of upper lobe, left bronchus or lung: Secondary | ICD-10-CM

## 2019-11-16 MED ORDER — ENTRECTINIB 200 MG PO CAPS: 600.0000 mg | ORAL_CAPSULE | Freq: Every day | ORAL | 6 refills | Status: DC

## 2019-11-16 NOTE — Telephone Encounter (Addendum)
Oral Chemotherapy Pharmacist Encounter   Patient is still eligibile for free Rozlytrek through [employer] assistance. Redirecting prescription to [facility].  Darl Pikes, PharmD, BCPS, BCOP, CPP Hematology/Oncology Clinical Pharmacist [facility] [phone]  11/16/2019 11:15 AM

## 2019-11-16 NOTE — Telephone Encounter (Signed)
Oral Oncology Pharmacist Encounter  Received a restart prescription for Rozlytrek (entrectinib) for the treatment of metastatic lung cancer, ROS-1 mutation positive, planned duration until disease progression or unacceptable drug toxicity.  CMP from 11/15/19 assessed, no relevant lab abnormalities. Prescription dose and frequency assessed. Dr. Rogue Bussing plans on starting him at 228m (2 capsules) and will increase as tolerated.  Current medication list in Epic reviewed, no relevant DDIs with entrectinib identified.  [facility]  ADarl Pikes PharmD, BCPS, BCOP, CPP Hematology/Oncology Clinical Pharmacist [phone] 11/16/2019 9:19 AM

## 2019-11-19 DIAGNOSIS — Z23 Encounter for immunization: Secondary | ICD-10-CM | POA: Diagnosis not present

## 2019-11-23 ENCOUNTER — Ambulatory Visit (INDEPENDENT_AMBULATORY_CARE_PROVIDER_SITE_OTHER): Payer: Medicare Other | Admitting: Urology

## 2019-11-23 ENCOUNTER — Encounter: Payer: Self-pay | Admitting: Urology

## 2019-11-23 ENCOUNTER — Other Ambulatory Visit: Payer: Self-pay | Admitting: Urology

## 2019-11-23 ENCOUNTER — Other Ambulatory Visit: Payer: Self-pay

## 2019-11-23 VITALS — BP 133/83 | HR 76 | Ht 64.0 in | Wt 146.8 lb

## 2019-11-23 DIAGNOSIS — N138 Other obstructive and reflux uropathy: Secondary | ICD-10-CM

## 2019-11-23 DIAGNOSIS — R972 Elevated prostate specific antigen [PSA]: Secondary | ICD-10-CM

## 2019-11-23 MED ORDER — GENTAMICIN SULFATE 40 MG/ML IJ SOLN
80.0000 mg | Freq: Once | INTRAMUSCULAR | Status: AC
  Administered 2019-11-23: 80 mg via INTRAMUSCULAR

## 2019-11-23 MED ORDER — LEVOFLOXACIN 500 MG PO TABS
500.0000 mg | ORAL_TABLET | Freq: Once | ORAL | Status: AC
  Administered 2019-11-23: 500 mg via ORAL

## 2019-11-23 NOTE — Patient Instructions (Addendum)
Transrectal Ultrasound-Guided Prostate Biopsy, Care After This sheet gives you information about how to care for yourself after your procedure. Your doctor may also give you more specific instructions. If you have problems or questions, contact your doctor. What can I expect after the procedure? After the procedure, it is common to have:  Pain and discomfort in your butt, especially while sitting.  Pink-colored pee (urine), due to small amounts of blood in the pee.  Burning while peeing (urinating).  Blood in your poop (stool).  Bleeding from your butt.  Blood in your semen. Follow these instructions at home: Medicines  Take over-the-counter and prescription medicines only as told by your doctor.  If you were prescribed antibiotic medicine, take it as told by your doctor. Do not stop taking the antibiotic even if you start to feel better. Activity   Do not drive for 24 hours if you were given a medicine to help you relax (sedative) during your procedure.  Return to your normal activities as told by your doctor. Ask your doctor what activities are safe for you.  Ask your doctor when it is okay for you to have sex.  Do not lift anything that is heavier than 10 lb (4.5 kg), or the limit that you are told, until your doctor says that it is safe. General instructions   Drink enough water to keep your pee pale yellow.  Watch your pee, poop, and semen for new bleeding or bleeding that gets worse.  Keep all follow-up visits as told by your doctor. This is important. Contact a doctor if you:  Have blood clots in your pee or poop.  Notice that your pee smells bad or unusual.  Have very bad belly pain.  Have trouble peeing.  Notice that your lower belly feels firm.  Have blood in your pee for more than 2 weeks after the procedure.  Have blood in your semen for more than 2 months after the procedure.  Have problems getting an erection.  Feel sick to your stomach  (nauseous).  Throw up (vomit).  Have new or worse bleeding in your pee, poop, or semen. Get help right away if you:  Have a fever or chills.  Have bright red pee.  Have very bad pain that does not get better with medicine.  Cannot pee. Summary  After this procedure, it is common to have pain and discomfort around your butt, especially while sitting.  You may have blood in your pee and poop.  It is common to have blood in your semen for 1-2 months.  If you were prescribed antibiotic medicine, take it as told by your doctor. Do not stop taking the antibiotic even if you start to feel better.  Get help right away if you have a fever or chills. This information is not intended to replace advice given to you by your health care provider. Make sure you discuss any questions you have with your health care provider. Document Revised: 12/14/2018 Document Reviewed: 06/22/2017 Elsevier Patient Education  Thomson Laser Enucleation of the Prostate (HoLEP)  HoLEP is a treatment for men with benign prostatic hyperplasia (BPH). The laser surgery removed blockages of urine flow, and is done without any incisions on the body.     What is HoLEP?  HoLEP is a type of laser surgery used to treat obstruction (blockage) of urine flow as a result of benign prostatic hyperplasia (BPH). In men with BPH, the prostate gland is not cancerous,  but has become enlarged. An enlarged prostate can result in a number of urinary tract symptoms such as weak urinary stream, difficulty in starting urination, inability to urinate, frequent urination, or getting up at night to urinate.  HoLEP was developed in the 1990's as a more effective and less expensive surgical option for BPH, compared to other surgical options such as laser vaporization(PVP/greenlight laser), transurethral resection of the prostate(TURP), and open simple prostatectomy.   What happens during a HoLEP?  HoLEP requires  general anesthesia ("asleep" throughout the procedure).   An antibiotic is given to reduce the risk of infection  A surgical instrument called a resectoscope is inserted through the urethra (the tube that carries urine from the bladder). The resectoscope has a camera that allows the surgeon to view the internal structure of the prostate gland, and to see where the incisions are being made during surgery.  The laser is inserted into the resectoscope and is used to enucleate (free up) the enlarged prostate tissue from the capsule (outer shell) and then to seal up any blood vessels. The tissue that has been removed is pushed back into the bladder.  A morcellator is placed through the resectoscope, and is used to suction out the prostate tissue that has been pushed into the bladder.  When the prostate tissue has been removed, the resectoscope is removed, and a foley catheter is placed to allow healing and drain the urine from the bladder.     What happens after a HoLEP?  More than 90% of patients go home the same day a few hours after surgery. Less than 10% will be admitted to the hospital overnight for observation to monitor the urine, or if they have other medical problems.  Fluid is flushed through the catheter for about 1 hour after surgery to clear any blood from the urine. It is normal to have some blood in the urine after surgery. The need for blood transfusion is extremely rare.  Eating and drinking are permitted after the procedure once the patient has fully awakened from anesthesia.  The catheter is usually removed 2-3 days after surgery- the patient will come to clinic to have the catheter removed and make sure they can urinate on their own.  It is very important to drink lots of fluids after surgery for one week to keep the bladder flushed.  At first, there may be some burning with urination, but this typically improved within a few hours to days. Most patients do not have a  significant amount of pain, and narcotic pain medications are rarely needed.  Symptoms of urinary frequency, urgency, and even leakage are NORMAL for the first few weeks after surgery as the bladder adjusts after having to work hard against blockage from the prostate for many years. This will improve, but can sometimes take several months.  The use of pelvic floor exercises (Kegel exercises) can help improve problems with urinary incontinence.   After catheter removal, patients will be seen at 6 weeks and 6 months for symptom check  No heavy lifting for at least 2-3 weeks after surgery, however patients can walk and do light activities the first day after surgery. Return to work time depends on occupation.    What are the advantages of HoLEP?  HoLEP has been studied in many different parts of the world and has been shown to be a safe and effective procedure. Although there are many types of BPH surgeries available, HoLEP offers a unique advantage in being able to  remove a large amount of tissue without any incisions on the body, even in very large prostates, while decreasing the risk of bleeding and providing tissue for pathology (to look for cancer). This decreases the need for blood transfusions during surgery, minimizes hospital stay, and reduces the risk of needing repeat treatment.  What are the side effects of HoLEP?  Temporary burning and bleeding during urination. Some blood may be seen in the urine for weeks after surgery and is part of the healing process.  Urinary incontinence (inability to control urine flow) is expected in all patients immediately after surgery and they should wear pads for the first few days/weeks. This typically improves over the course of several weeks. Performing Kegel exercises can help decrease leakage from stress maneuvers such as coughing, sneezing, or lifting. The rate of long term leakage is very low. Patients may also have leakage with urgency and this may  be treated with medication. The risk of urge incontinence can be dependent on several factors including age, prostate size, symptoms, and other medical problems.  Retrograde ejaculation or "backwards ejaculation." In 75% of cases, the patient will not see any fluid during ejaculation after surgery.  Erectile function is generally not significantly affected.   What are the risks of HoLEP?  Injury to the urethra or development of scar tissue at a later date  Injury to the capsule of the prostate (typically treated with longer catheterization).  Injury to the bladder or ureteral orifices (where the urine from the kidney drains out)  Infection of the bladder, testes, or kidneys  Return of urinary obstruction at a later date requiring another operation (<2%)  Need for blood transfusion or re-operation due to bleeding  Failure to relieve all symptoms and/or need for prolonged catheterization after surgery  5-15% of patients are found to have previously undiagnosed prostate cancer in their specimen. Prostate cancer can be treated after HoLEP.  Standard risks of anesthesia including blood clots, heart attacks, etc  When should I call my doctor?  Fever over 101.3 degrees  Inability to urinate, or large blood clots in the urine

## 2019-11-23 NOTE — Progress Notes (Signed)
   11/23/19  Indication:  I am filling in for Dr. Bernardo Heater today, as he is unexpectedly out of clinic.  [name] is a 69 year old male with metastatic lung cancer to the brain and rising PSA, most recently 12.2 in February 2021 from 6.7 in June 2020. He has had a good response to his metastatic lung cancer, and oncology felt prostate biopsy was warranted for further work-up of his elevated PSA.   A prostate MRI in February 2020 showed a 170 g gland with a PI-RADS-4 2cm lesion in the left posterior lateral mid gland peripheral zone, as well as a PI-RADS 3 lesion of the right apical posterior lateral peripheral zone measuring 1.4 cm.  He also complains of bothersome weak urinary stream, and he is already on maximal medical therapy with Flomax and finasteride.  It sounds like he recently stopped finasteride at his oncologist recommendation.  Prostate Biopsy Procedure   Informed consent was obtained, and we discussed the risks of bleeding and infection/sepsis. A time out was performed to ensure correct patient identity.  Pre-Procedure: - Last PSA Level: 12.2 - Gentamicin and levaquin given for antibiotic prophylaxis - Transrectal Ultrasound performed revealing a 174 gm prostate, PSA density 0.07 - Large median lobe, hypoechoic areas at right apex and left lateral mid consistent with prior MRI findings, areas biopsied  Procedure: - Prostate block performed using 10 cc 1% lidocaine and biopsies taken from sextant areas, a total of 12 under ultrasound guidance.  Post-Procedure: - Patient tolerated the procedure well - He was counseled to seek immediate medical attention if experiences significant bleeding, fevers, or severe pain - Return in one week to discuss biopsy results  Assessment/ Plan:  -Will follow up with Dr. Bernardo Heater in 1-2 weeks to discuss pathology  -If biopsy negative, consider HoLEP for outlet procedure with his bothersome BPH symptoms and massively enlarged gland  refractory to maximal medical management  Nickolas Madrid, MD 11/23/2019

## 2019-11-24 ENCOUNTER — Other Ambulatory Visit: Payer: Self-pay | Admitting: Urology

## 2019-11-25 ENCOUNTER — Other Ambulatory Visit: Payer: Self-pay | Admitting: Urology

## 2019-11-29 LAB — ANATOMIC PATHOLOGY REPORT: PDF Image: 0

## 2019-12-01 ENCOUNTER — Telehealth: Payer: Self-pay | Admitting: *Deleted

## 2019-12-01 NOTE — Telephone Encounter (Signed)
-----   Message from Abbie Sons, MD sent at 12/01/2019  8:04 AM EDT ----- Can you let patient know prostate biopsy showed no evidence of cancer.  Keep follow-up as scheduled

## 2019-12-01 NOTE — Telephone Encounter (Signed)
Notified patient as instructed, patient pleased. Discussed follow-up appointments, patient agrees  

## 2019-12-04 ENCOUNTER — Inpatient Hospital Stay: Payer: Medicare Other

## 2019-12-04 NOTE — Progress Notes (Signed)
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  Patient followed by Dr. Jacinto Reap.   Spoke with patient via phone for follow-up.  Patient reports appetite is better and he is eating well.  Reports some constipation.  Patient eating 2 eggs, sausage, toast, applesauce this am for breakfast.  Last night had pinto beans, green salad, macaroni and cheese, roast beef and yams for supper. Has not been drinking ensure as often as eating better.    Reports issues with constipation    Medications: reviewed  Labs: reviewed  Anthropometrics:   Weight increased to 146 lb 12.8 oz on 3/18  NUTRITION DIAGNOSIS: Inadequate oral intake improved   INTERVENTION:  Encouraged patient to discuss constipation with Dr. B at next appointment on 3/31.   Encouraged patient to continue eating high calorie, high protein foods.   Patient prefers to call RD if needed in the future    NEXT VISIT: no follow-up Patient to call RD if needed.  Contact number provided  Mylah Baynes B. Zenia Resides, [place], [place] Registered Dietitian [phone] (pager)

## 2019-12-05 ENCOUNTER — Inpatient Hospital Stay (HOSPITAL_BASED_OUTPATIENT_CLINIC_OR_DEPARTMENT_OTHER): Payer: Medicare Other | Admitting: Hospice and Palliative Medicine

## 2019-12-05 DIAGNOSIS — C3412 Malignant neoplasm of upper lobe, left bronchus or lung: Secondary | ICD-10-CM | POA: Diagnosis not present

## 2019-12-05 DIAGNOSIS — Z515 Encounter for palliative care: Secondary | ICD-10-CM

## 2019-12-05 NOTE — Progress Notes (Signed)
Virtual Visit via Telephone Note  I connected with Melvin Willis on 12/05/19 at 10:30 AM EDT by telephone and verified that I am speaking with the correct person using two identifiers.   I discussed the limitations, risks, security and privacy concerns of performing an evaluation and management service by telephone and the availability of in person appointments. I also discussed with the patient that there may be a patient responsible charge related to this service. The patient expressed understanding and agreed to proceed.   History of Present Illness: Melvin Willis is a 69 y.o. 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  Patient is status post whole brain radiation and is currently on treatment with Avastin.  MRI of the brain on 07/25/2019 revealed progression of enhancing lesion in the left temporal lobe concerning for either radiation necrosis or progressive metastatic disease.  Patient was referred to palliative care to help address goals and manage ongoing symptoms.   Observations/Objective: I called and spoke with patient by phone.  He reports that he is doing well.  Denies any acute changes or concerns.  He says that his appetite is better.  He has gained a few pounds.  No distressing symptoms are reported today.  Performance status is stable.  No issues with medications nor request for refills.  Assessment and Plan: Stage IV lung cancer -currently on treatment with Avastin.  Has follow-up on 3/31 with Dr. Rogue Bussing  Weight loss -improved.  Follow weight trends.  RD following.  ACP -patient discussing decision making with his family  Follow Up Instructions: Follow-up virtual visit 1 to 2 months   I discussed the assessment and treatment plan with the patient. The patient was provided an opportunity to ask questions and all were answered. The patient agreed with the plan and demonstrated an understanding of the  instructions.   The patient was advised to call back or seek an in-person evaluation if the symptoms worsen or if the condition fails to improve as anticipated.  I provided 5 minutes of non-face-to-face time during this encounter.   Irean Hong, NP

## 2019-12-06 ENCOUNTER — Encounter: Payer: Self-pay | Admitting: Internal Medicine

## 2019-12-06 ENCOUNTER — Inpatient Hospital Stay (HOSPITAL_BASED_OUTPATIENT_CLINIC_OR_DEPARTMENT_OTHER): Payer: Medicare Other | Admitting: Internal Medicine

## 2019-12-06 ENCOUNTER — Inpatient Hospital Stay: Payer: Medicare Other

## 2019-12-06 ENCOUNTER — Other Ambulatory Visit: Payer: Self-pay

## 2019-12-06 VITALS — BP 136/87 | HR 77 | Temp 96.4°F | Resp 20 | Ht 64.0 in | Wt 150.0 lb

## 2019-12-06 DIAGNOSIS — C3412 Malignant neoplasm of upper lobe, left bronchus or lung: Secondary | ICD-10-CM | POA: Diagnosis not present

## 2019-12-06 DIAGNOSIS — C7951 Secondary malignant neoplasm of bone: Secondary | ICD-10-CM | POA: Diagnosis not present

## 2019-12-06 DIAGNOSIS — N183 Chronic kidney disease, stage 3 unspecified: Secondary | ICD-10-CM | POA: Diagnosis not present

## 2019-12-06 DIAGNOSIS — E1122 Type 2 diabetes mellitus with diabetic chronic kidney disease: Secondary | ICD-10-CM | POA: Diagnosis not present

## 2019-12-06 DIAGNOSIS — C7931 Secondary malignant neoplasm of brain: Secondary | ICD-10-CM | POA: Diagnosis not present

## 2019-12-06 DIAGNOSIS — R5383 Other fatigue: Secondary | ICD-10-CM | POA: Diagnosis not present

## 2019-12-06 LAB — COMPREHENSIVE METABOLIC PANEL
ALT: 21 U/L (ref 0–44)
AST: 22 U/L (ref 15–41)
Albumin: 4 g/dL (ref 3.5–5.0)
Alkaline Phosphatase: 48 U/L (ref 38–126)
Anion gap: 7 (ref 5–15)
BUN: 19 mg/dL (ref 8–23)
CO2: 25 mmol/L (ref 22–32)
Calcium: 9.1 mg/dL (ref 8.9–10.3)
Chloride: 104 mmol/L (ref 98–111)
Creatinine, Ser: 1.56 mg/dL — ABNORMAL HIGH (ref 0.61–1.24)
GFR calc Af Amer: 53 mL/min — ABNORMAL LOW (ref >60)
GFR calc non Af Amer: 46 mL/min — ABNORMAL LOW (ref >60)
Glucose, Bld: 220 mg/dL — ABNORMAL HIGH (ref 70–99)
Potassium: 4.6 mmol/L (ref 3.5–5.1)
Sodium: 136 mmol/L (ref 135–145)
Total Bilirubin: 0.6 mg/dL (ref 0.3–1.2)
Total Protein: 7.3 g/dL (ref 6.5–8.1)

## 2019-12-06 LAB — CBC WITH DIFFERENTIAL/PLATELET
Abs Immature Granulocytes: 0.01 10*3/uL (ref 0.00–0.07)
Basophils Absolute: 0 10*3/uL (ref 0.0–0.1)
Basophils Relative: 1 %
Eosinophils Absolute: 0.1 10*3/uL (ref 0.0–0.5)
Eosinophils Relative: 3 %
HCT: 44.4 % (ref 39.0–52.0)
Hemoglobin: 15 g/dL (ref 13.0–17.0)
Immature Granulocytes: 0 %
Lymphocytes Relative: 34 %
Lymphs Abs: 1.6 10*3/uL (ref 0.7–4.0)
MCH: 29.3 pg (ref 26.0–34.0)
MCHC: 33.8 g/dL (ref 30.0–36.0)
MCV: 86.7 fL (ref 80.0–100.0)
Monocytes Absolute: 0.5 10*3/uL (ref 0.1–1.0)
Monocytes Relative: 10 %
Neutro Abs: 2.5 10*3/uL (ref 1.7–7.7)
Neutrophils Relative %: 52 %
Platelets: 259 10*3/uL (ref 150–400)
RBC: 5.12 MIL/uL (ref 4.22–5.81)
RDW: 15.5 % (ref 11.5–15.5)
WBC: 4.7 10*3/uL (ref 4.0–10.5)
nRBC: 0 % (ref 0.0–0.2)

## 2019-12-06 NOTE — Assessment & Plan Note (Addendum)
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 S/p AVASTIN x 6-February 2021 -improved MRI brain lesions.  Stable.  #Continue 2 pills Rozyltek [creatinine 1.5]; if creatinine stable would recommend increasing at next visit.  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   # Elevated PSA-  FEB 2021- 12;  S/p Bx- Negative; follow with urology.  #Recommend not drive given his brain metastases status post radiation.  Long discussion with the patient and his wife patient reluctantly agrees on to drive  # DISPOSITION: # follow up in 21monthMD;labs- cbc/cmp; CT Chest- Dr.B  # I reviewed the blood work- with the patient in detail; also reviewed the imaging independently [as summarized above]; and with the patient in detail.

## 2019-12-06 NOTE — Progress Notes (Signed)
[facility] OFFICE PROGRESS NOTE  Patient Care Team: [name], [name], MD as Medical Oncologist (Medical Oncology) Cathi Roan, [facility] (Pharmacist)  Cancer Staging No matching staging information was found for the patient.   Oncology History Overview Note  # OCT 2017- ADENO CA LUNG; STAGE IV [; LUL; bil supraclavicular LN; Left neck LN Bx]; ROS-1 MUTATED; s/p Carbo-alimta x1[oct 2017]  # NOV 1st 2017- XALKORI 250 mg BID; JAN 15th CT- PR;  # AUG 27th Chemo-RT to persistent LUL/mediastinal LN [s/p carbo-taxol- with RT; finished Sep 22nd 2018];   # July 14th 2020- multiple metastatic lesions of the brain [July 30 whole brain radiation finished April 21, 2019]; bone scan skull metastases /posterior left ninth rib metastases; April 04, 2019 stopped crizotinib;  #April 25, 2019-start Entrectinib 600 mg once a day; STopped in NOV 2020 [dizziness]; NOV 2020-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   # LLE DVT/bil PE/Multiple strokes [? On xarelto]-Lovenox; Jan mid 2018- xarelto [lovenox-insurance issues]  # MRI brain- multiple infarcts [2d echo/bubble study-NEG's/p Neurology eval]  ------------------------------------------------------------    # # s/p TURP [Sep 2017; Dr.Cope] DEC 26th CT- distended bladder   # MOLECULAR TESTING- ROS-1 POSITIVE; ALK/EGFR-NEG; PDL-1 EXPRESSION- 90%** [HIGH]  # DEC 15th 2020- PALLIATIVE CARE -----------------------------------------------------------------    DIAGNOSIS: Adenocarcinoma the lung  ROS-1 +  STAGE:  IV    ;GOALS: Palliative  CURRENT/MOST RECENT THERAPY- Avastin [C]   Primary cancer of left upper lobe of lung ([place])  08/09/2019 -   Chemotherapy   The patient had bevacizumab-awwb (MVASI) 800 mg in sodium chloride 0.9 % 100 mL chemo infusion, 725 mg, Intravenous,  Once, 6 of 6 cycles Dose modification: 10 mg/kg (original dose 10 mg/kg, Cycle 3, Reason: Other (see comments), Comment: weight change) Administration: 800 mg (08/09/2019), 800 mg (08/22/2019), 700 mg (09/19/2019), 700 mg (10/04/2019), 700 mg (10/18/2019), 700 mg (11/01/2019)  for chemotherapy treatment.        INTERVAL HISTORY:  Melvin Willis 69 y.o.  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  Patient interim underwent a biopsy of his prostate negative for malignancy.  Denies any bleeding post biopsy.  Denies any nausea vomiting but appetite is good.  No dizziness.  No falls.  As per the wife patient having memory issues; slow reflexes.  Concerned about driving   Review of Systems  Constitutional: Positive for malaise/fatigue. Negative for chills, diaphoresis, fever and weight loss.  HENT: Negative for nosebleeds and sore throat.   Eyes: Negative for double vision.  Respiratory: Negative for cough, hemoptysis, sputum production, shortness of breath and wheezing.   Cardiovascular: Negative for chest pain (Left upper chest wall pain chronic), palpitations and orthopnea.  Gastrointestinal: Negative for abdominal pain, blood in stool, constipation, diarrhea, heartburn and melena.  Genitourinary: Negative for dysuria.  Musculoskeletal: Positive for joint pain. Negative for back pain.  Skin: Negative.  Negative for itching and rash.  Neurological: Negative for tingling, focal weakness and weakness.  Endo/Heme/Allergies: Does not bruise/bleed easily.  Psychiatric/Behavioral: Positive for memory loss. Negative for depression. The patient is not nervous/anxious and does not have insomnia.     PAST MEDICAL HISTORY :  Past  Medical History:  Diagnosis Date  .  Abnormal prostate specific antigen 08/08/2012  . Adiposity 04/16/2015  . Chronic kidney disease (CKD), stage III (moderate) 11/27/2016  . CVA (cerebral vascular accident) ([place]) 06/17/2016  . Diabetes mellitus without complication ([place])   . Diverticulosis of sigmoid colon 04/16/2015  . Dyslipidemia 03/18/2015  . Hemorrhoids, internal 04/16/2015  . Hypercholesteremia 04/16/2015  . Hyperlipidemia   . Hypertension   . Primary cancer of left upper lobe of lung ([place])   . Pulmonary embolism ([place])   . Wears dentures    partial upper    PAST SURGICAL HISTORY :   Past Surgical History:  Procedure Laterality Date  . COLONOSCOPY    . COLONOSCOPY WITH PROPOFOL N/A 05/06/2015   Procedure: COLONOSCOPY WITH PROPOFOL;  Surgeon: Lucilla Lame, MD;  Location: [facility];  Service: Endoscopy;  Laterality: N/A;  ASCENDING COLON POLYPS X 2 TERMINAL ILEUM BIOPSY RANDOM COLON BX. TRANSVERSE COLON POLYP SIGMOID COLON POLYP  . ESOPHAGOGASTRODUODENOSCOPY (EGD) WITH PROPOFOL N/A 05/06/2015   Procedure: ESOPHAGOGASTRODUODENOSCOPY (EGD) WITH PROPOFOL;  Surgeon: Lucilla Lame, MD;  Location: [facility];  Service: Endoscopy;  Laterality: N/A;  GASTRIC BIOPSY X1  . KIDNEY STONE SURGERY  2017    FAMILY HISTORY :   Family History  Problem Relation Age of Onset  . Diabetes Mother   . Diabetes Father   . CAD Father   . Dementia Father   . Diabetes Sister   . Cancer Maternal Uncle        Prostate  . Cancer Cousin        prostate    SOCIAL HISTORY:   Social History   Tobacco Use  . Smoking status: Never Smoker  . Smokeless tobacco: Never Used  Substance Use Topics  . Alcohol use: No    Alcohol/week: 0.0 standard drinks  . Drug use: No    ALLERGIES:  has No Known Allergies.  MEDICATIONS:  Current Outpatient Medications  Medication Sig Dispense Refill  . atorvastatin (LIPITOR) 40 MG tablet Take 1 tablet (40 mg total) by mouth 3 (three) times a week. 48 tablet 1  . blood glucose meter kit and  supplies Dispense based on patient and insurance preference (Accuchek Aviva Plus, Accuchek Nano). Use up to four times daily as directed. (FOR ICD-10 E10.9, E11.9). 1 each 0  . entrectinib (ROZLYTREK) 200 MG capsule Take 3 capsules (600 mg total) by mouth daily. 90 capsule 6  . metFORMIN (GLUCOPHAGE) 500 MG tablet Take 1 tablet (500 mg total) by mouth 3 (three) times daily. 270 tablet 0  . ondansetron (ZOFRAN) 8 MG tablet One pill every 8 hours as needed for nausea/vomitting. 40 tablet 1  . tamsulosin (FLOMAX) 0.4 MG CAPS capsule Take 1 capsule by mouth once daily in the morning 87 capsule 0  . XARELTO 20 MG TABS tablet TAKE 1 TABLET BY MOUTH ONCE DAILY WITH SUPPER 30 tablet 0   No current facility-administered medications for this visit.    PHYSICAL EXAMINATION: ECOG PERFORMANCE STATUS: 0 - Asymptomatic  BP 136/87 (BP Location: Left Arm, Patient Position: Sitting)   Pulse 77   Temp (!) 96.4 F (35.8 C) (Tympanic)   Resp 20   Ht '5\' 4"'  (1.626 m)   Wt 150 lb (68 kg)   BMI 25.75 kg/m   Filed Weights   12/06/19 1323  Weight: 150 lb (68 kg)    Physical Exam  Constitutional: He is oriented to person, place, and time and well-developed, well-nourished, and in no  distress.  Patient is accompanied by his wife.  He is walking by himself.  HENT:  Head: Normocephalic and atraumatic.  Mouth/Throat: Oropharynx is clear and moist. No oropharyngeal exudate.  Eyes: Pupils are equal, round, and reactive to light.  Cardiovascular: Normal rate and regular rhythm.  Pulmonary/Chest: No respiratory distress. He has no wheezes.  Abdominal: Soft. Bowel sounds are normal. He exhibits no distension and no mass. There is no abdominal tenderness. There is no rebound and no guarding.  Musculoskeletal:        General: No tenderness or edema. Normal range of motion.     Cervical back: Normal range of motion and neck supple.  Neurological: He is alert and oriented to person, place, and time.  Skin: Skin is  warm.  Psychiatric: Affect normal.   LABORATORY DATA:  I have reviewed the data as listed    Component Value Date/Time   NA 136 12/06/2019 1305   NA 139 01/01/2016 1000   K 4.6 12/06/2019 1305   CL 104 12/06/2019 1305   CO2 25 12/06/2019 1305   GLUCOSE 220 (H) 12/06/2019 1305   BUN 19 12/06/2019 1305   BUN 13 01/01/2016 1000   CREATININE 1.56 (H) 12/06/2019 1305   CREATININE 1.32 (H) 08/22/2018 1107   CALCIUM 9.1 12/06/2019 1305   PROT 7.3 12/06/2019 1305   PROT 7.2 01/01/2016 1000   ALBUMIN 4.0 12/06/2019 1305   ALBUMIN 4.4 01/01/2016 1000   AST 22 12/06/2019 1305   ALT 21 12/06/2019 1305   ALKPHOS 48 12/06/2019 1305   BILITOT 0.6 12/06/2019 1305   BILITOT 0.4 01/01/2016 1000   GFRNONAA 46 (L) 12/06/2019 1305   GFRNONAA 56 (L) 08/22/2018 1107   GFRAA 53 (L) 12/06/2019 1305   GFRAA 65 08/22/2018 1107    No results found for: SPEP, UPEP  Lab Results  Component Value Date   WBC 4.7 12/06/2019   NEUTROABS 2.5 12/06/2019   HGB 15.0 12/06/2019   HCT 44.4 12/06/2019   MCV 86.7 12/06/2019   PLT 259 12/06/2019      Chemistry      Component Value Date/Time   NA 136 12/06/2019 1305   NA 139 01/01/2016 1000   K 4.6 12/06/2019 1305   CL 104 12/06/2019 1305   CO2 25 12/06/2019 1305   BUN 19 12/06/2019 1305   BUN 13 01/01/2016 1000   CREATININE 1.56 (H) 12/06/2019 1305   CREATININE 1.32 (H) 08/22/2018 1107      Component Value Date/Time   CALCIUM 9.1 12/06/2019 1305   ALKPHOS 48 12/06/2019 1305   AST 22 12/06/2019 1305   ALT 21 12/06/2019 1305   BILITOT 0.6 12/06/2019 1305   BILITOT 0.4 01/01/2016 1000       RADIOGRAPHIC STUDIES: I have personally reviewed the radiological images as listed and agreed with the findings in the report. No results found.   ASSESSMENT & PLAN:  Primary cancer of left upper lobe of lung ([place]) # Adenocarcinoma of the lung metastatic/stage IV-ROS-1 positive: NOV 17th CT chest left upper lobe treatment changes; right lower lobe-mild  groundglass opacity-without an obvious progression of disease. S/p AVASTIN x 6-February 2021 -improved MRI brain lesions.  Stable.  #Continue 2 pills Rozyltek [creatinine 1.5]; if creatinine stable would recommend increasing at next visit.  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   # Elevated PSA-  FEB 2021- 12;  S/p Bx- Negative; follow with urology.  #Recommend not drive given his brain metastases status  post radiation.  Long discussion with the patient and his wife patient reluctantly agrees on to drive  # DISPOSITION: # follow up in 66monthMD;labs- cbc/cmp; CT Chest- Dr.B  # I reviewed the blood work- with the patient in detail; also reviewed the imaging independently [as summarized above]; and with the patient in detail.      Orders Placed This Encounter  Procedures  . CT CHEST WO CONTRAST    Standing Status:   Future    Standing Expiration Date:   12/05/2020    Order Specific Question:   Preferred imaging location?    Answer:   [facility]    Order Specific Question:   Radiology Contrast Protocol - do NOT remove file path    Answer:   [email or web address]    Order Specific Question:   ** REASON FOR EXAM (FREE TEXT)    Answer:   lung cancer   All questions were answered. The patient knows to call the clinic with any problems, questions or concerns.      GCammie Sickle MD 12/06/2019 2:19 PM

## 2019-12-10 NOTE — Progress Notes (Signed)
12/11/19 12:27 PM   [place] Maryan Char [birth year] [id number]  Referring provider: [name], MD [address] [place],  [place] [address]  Chief Complaint  Patient presents with  . Follow-up    Urologic history: 1.  BPH with lower urinary tract symptoms             -Combination therapy tamsulosin/finasteride             -prostate MRI showed a PI-RADS 4 lesion in the left PZ mid gland and PI-RADS 3 lesion in the right PZ.  2.  Elevated PSA             -Previous biopsy Dr. [name] before 2014.  Records not available  3.  History of bladder calculus             -Cystolitholapaxy August 2017  HPI: 69 y.o. male returns today s/p prostate biopsy.   -hx of metastatic lung cancer to brain -benign prostate bx 11/23/19 -path report NED  -TRUS 174 gm  -discontinued use of Flomax  -PVR 11 cc  PSA trend:  03/2016             8.14 02/2016             7.22 08/2017           8.20 08/2018           7.3 02/2019  6.7  10/2019  12.2   PMH: Past Medical History:  Diagnosis Date  . Abnormal prostate specific antigen 08/08/2012  . Adiposity 04/16/2015  . Chronic kidney disease (CKD), stage III (moderate) 11/27/2016  . CVA (cerebral vascular accident) ([place]) 06/17/2016  . Diabetes mellitus without complication ([place])   . Diverticulosis of sigmoid colon 04/16/2015  . Dyslipidemia 03/18/2015  . Hemorrhoids, internal 04/16/2015  . Hypercholesteremia 04/16/2015  . Hyperlipidemia   . Hypertension   . Primary cancer of left upper lobe of lung ([place])   . Pulmonary embolism ([place])   . Wears dentures    partial upper    Surgical History: Past Surgical History:  Procedure Laterality Date  . COLONOSCOPY    . COLONOSCOPY WITH PROPOFOL N/A 05/06/2015   Procedure: COLONOSCOPY WITH PROPOFOL;  Surgeon: Lucilla Lame, MD;  Location: [facility];  Service: Endoscopy;  Laterality: N/A;  ASCENDING COLON POLYPS X 2 TERMINAL ILEUM BIOPSY RANDOM COLON BX. TRANSVERSE COLON POLYP SIGMOID  COLON POLYP  . ESOPHAGOGASTRODUODENOSCOPY (EGD) WITH PROPOFOL N/A 05/06/2015   Procedure: ESOPHAGOGASTRODUODENOSCOPY (EGD) WITH PROPOFOL;  Surgeon: Lucilla Lame, MD;  Location: [facility];  Service: Endoscopy;  Laterality: N/A;  GASTRIC BIOPSY X1  . KIDNEY STONE SURGERY  2017    Home Medications:  Allergies as of 12/11/2019   No Known Allergies     Medication List       Accurate as of December 11, 2019 12:27 PM. If you have any questions, ask your nurse or doctor.        atorvastatin 40 MG tablet Commonly known as: LIPITOR Take 1 tablet (40 mg total) by mouth 3 (three) times a week.   blood glucose meter kit and supplies Dispense based on patient and insurance preference (Accuchek Aviva Plus, Accuchek Nano). Use up to four times daily as directed. (FOR ICD-10 E10.9, E11.9).   entrectinib 200 MG capsule Commonly known as: ROZLYTREK Take 3 capsules (600 mg total) by mouth daily.   metFORMIN 500 MG tablet Commonly known as: GLUCOPHAGE Take 1 tablet (500 mg total) by mouth 3 (three)  times daily.   ondansetron 8 MG tablet Commonly known as: ZOFRAN One pill every 8 hours as needed for nausea/vomitting.   tamsulosin 0.4 MG Caps capsule Commonly known as: FLOMAX Take 1 capsule by mouth once daily in the morning   Xarelto 20 MG Tabs tablet Generic drug: rivaroxaban TAKE 1 TABLET BY MOUTH ONCE DAILY WITH SUPPER       Allergies: No Known Allergies  Family History: Family History  Problem Relation Age of Onset  . Diabetes Mother   . Diabetes Father   . CAD Father   . Dementia Father   . Diabetes Sister   . Cancer Maternal Uncle        Prostate  . Cancer Cousin        prostate    Social History:  reports that he has never smoked. He has never used smokeless tobacco. He reports that he does not drink alcohol or use drugs.   Physical Exam: BP 127/82   Pulse 67   Ht '5\' 4"'  (1.626 m)   Wt 150 lb (68 kg)   BMI 25.75 kg/m   Constitutional:  Alert and oriented,  No acute distress. HEENT: [place] AT, moist mucus membranes.  Trachea midline, no masses. Cardiovascular: No clubbing, cyanosis, or edema. Respiratory: Normal respiratory effort, no increased work of breathing. Skin: No rashes, bruises or suspicious lesions. Neurologic: Grossly intact, no focal deficits, moving all 4 extremities. Psychiatric: Normal mood and affect.  Laboratory Data:  Lab Results  Component Value Date   CREATININE 1.56 (H) 12/06/2019   Pertinent Imaging: Results for orders placed or performed in visit on 12/11/19  Bladder Scan (Post Void Residual) in office  Result Value Ref Range   Scan Result 11    Assessment & Plan:    1. Elevated PSA  Adequate emptying of bladder Benign prostate bx 11/23/19 Path report reviewed, NED Dr. Diamantina Providence had discussed HOLEP at the time of biopsy however he does not desire to proceed with surgery at this time. F/u with Dr. Diamantina Providence in 6 months to discuss further Instructed to call earlier for worsening voiding symptoms   Abbie Sons, MD  [facility] [address] [place], [place] [address] [phone]  I, Lucas Mallow, am acting as a scribe for Dr. Nicki Reaper C. Storm Dulski,  I have reviewed the above documentation for accuracy and completeness, and I agree with the above.   Abbie Sons, MD

## 2019-12-11 ENCOUNTER — Other Ambulatory Visit: Payer: Self-pay | Admitting: Urology

## 2019-12-11 ENCOUNTER — Ambulatory Visit (INDEPENDENT_AMBULATORY_CARE_PROVIDER_SITE_OTHER): Payer: Medicare Other | Admitting: Urology

## 2019-12-11 ENCOUNTER — Encounter: Payer: Self-pay | Admitting: Urology

## 2019-12-11 ENCOUNTER — Other Ambulatory Visit: Payer: Self-pay

## 2019-12-11 VITALS — BP 127/82 | HR 67 | Ht 64.0 in | Wt 150.0 lb

## 2019-12-11 DIAGNOSIS — N138 Other obstructive and reflux uropathy: Secondary | ICD-10-CM | POA: Diagnosis not present

## 2019-12-11 DIAGNOSIS — N401 Enlarged prostate with lower urinary tract symptoms: Secondary | ICD-10-CM | POA: Diagnosis not present

## 2019-12-11 LAB — BLADDER SCAN AMB NON-IMAGING: Scan Result: 11

## 2019-12-11 MED ORDER — TAMSULOSIN HCL 0.4 MG PO CAPS: ORAL_CAPSULE | ORAL | 3 refills | Status: DC

## 2019-12-14 ENCOUNTER — Other Ambulatory Visit: Payer: Self-pay | Admitting: Internal Medicine

## 2019-12-14 DIAGNOSIS — C3412 Malignant neoplasm of upper lobe, left bronchus or lung: Secondary | ICD-10-CM

## 2019-12-14 DIAGNOSIS — I2699 Other pulmonary embolism without acute cor pulmonale: Secondary | ICD-10-CM

## 2020-01-02 ENCOUNTER — Ambulatory Visit
Admission: RE | Admit: 2020-01-02 | Discharge: 2020-01-02 | Disposition: A | Discharge status: Home / Self Care | Payer: Medicare Other | Source: Ambulatory Visit | Attending: Internal Medicine | Admitting: Internal Medicine

## 2020-01-02 ENCOUNTER — Other Ambulatory Visit: Payer: Self-pay | Admitting: Family Medicine

## 2020-01-02 ENCOUNTER — Other Ambulatory Visit: Payer: Self-pay

## 2020-01-02 ENCOUNTER — Inpatient Hospital Stay: Payer: Medicare Other | Attending: Hospice and Palliative Medicine | Admitting: Hospice and Palliative Medicine

## 2020-01-02 DIAGNOSIS — E1169 Type 2 diabetes mellitus with other specified complication: Secondary | ICD-10-CM

## 2020-01-02 DIAGNOSIS — R5383 Other fatigue: Secondary | ICD-10-CM | POA: Insufficient documentation

## 2020-01-02 DIAGNOSIS — Z833 Family history of diabetes mellitus: Secondary | ICD-10-CM | POA: Insufficient documentation

## 2020-01-02 DIAGNOSIS — M2550 Pain in unspecified joint: Secondary | ICD-10-CM | POA: Insufficient documentation

## 2020-01-02 DIAGNOSIS — E785 Hyperlipidemia, unspecified: Secondary | ICD-10-CM | POA: Insufficient documentation

## 2020-01-02 DIAGNOSIS — Z8673 Personal history of transient ischemic attack (TIA), and cerebral infarction without residual deficits: Secondary | ICD-10-CM | POA: Insufficient documentation

## 2020-01-02 DIAGNOSIS — Z515 Encounter for palliative care: Secondary | ICD-10-CM | POA: Diagnosis not present

## 2020-01-02 DIAGNOSIS — C7931 Secondary malignant neoplasm of brain: Secondary | ICD-10-CM | POA: Insufficient documentation

## 2020-01-02 DIAGNOSIS — Z818 Family history of other mental and behavioral disorders: Secondary | ICD-10-CM | POA: Insufficient documentation

## 2020-01-02 DIAGNOSIS — N183 Chronic kidney disease, stage 3 unspecified: Secondary | ICD-10-CM | POA: Insufficient documentation

## 2020-01-02 DIAGNOSIS — Z7901 Long term (current) use of anticoagulants: Secondary | ICD-10-CM | POA: Insufficient documentation

## 2020-01-02 DIAGNOSIS — R972 Elevated prostate specific antigen [PSA]: Secondary | ICD-10-CM | POA: Insufficient documentation

## 2020-01-02 DIAGNOSIS — C3412 Malignant neoplasm of upper lobe, left bronchus or lung: Secondary | ICD-10-CM | POA: Diagnosis not present

## 2020-01-02 DIAGNOSIS — Z79899 Other long term (current) drug therapy: Secondary | ICD-10-CM | POA: Insufficient documentation

## 2020-01-02 DIAGNOSIS — Z86711 Personal history of pulmonary embolism: Secondary | ICD-10-CM | POA: Insufficient documentation

## 2020-01-02 DIAGNOSIS — C7951 Secondary malignant neoplasm of bone: Secondary | ICD-10-CM | POA: Insufficient documentation

## 2020-01-02 DIAGNOSIS — Z8719 Personal history of other diseases of the digestive system: Secondary | ICD-10-CM | POA: Insufficient documentation

## 2020-01-02 DIAGNOSIS — Z8249 Family history of ischemic heart disease and other diseases of the circulatory system: Secondary | ICD-10-CM | POA: Insufficient documentation

## 2020-01-02 DIAGNOSIS — R413 Other amnesia: Secondary | ICD-10-CM | POA: Insufficient documentation

## 2020-01-02 DIAGNOSIS — Z8042 Family history of malignant neoplasm of prostate: Secondary | ICD-10-CM | POA: Insufficient documentation

## 2020-01-02 DIAGNOSIS — Z86010 Personal history of colonic polyps: Secondary | ICD-10-CM | POA: Insufficient documentation

## 2020-01-02 DIAGNOSIS — E1122 Type 2 diabetes mellitus with diabetic chronic kidney disease: Secondary | ICD-10-CM | POA: Insufficient documentation

## 2020-01-02 MED ORDER — METFORMIN HCL 500 MG PO TABS: 500.0000 mg | ORAL_TABLET | Freq: Three times a day (TID) | ORAL | 0 refills | Status: DC

## 2020-01-02 NOTE — Progress Notes (Signed)
Virtual Visit via Telephone Note  I connected with Melvin Willis on 01/02/20 at 11:30 AM EDT by telephone and verified that I am speaking with the correct person using two identifiers.   I discussed the limitations, risks, security and privacy concerns of performing an evaluation and management service by telephone and the availability of in person appointments. I also discussed with the patient that there may be a patient responsible charge related to this service. The patient expressed understanding and agreed to proceed.   History of Present Illness: Melvin Willis is a 69 y.o. 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  Patient is status post whole brain radiation and is currently on treatment with Avastin.  MRI of the brain on 07/25/2019 revealed progression of enhancing lesion in the left temporal lobe concerning for either radiation necrosis or progressive metastatic disease.  Patient was referred to palliative care to help address goals and manage ongoing symptoms.   Observations/Objective: Virtual visit was attempted but patient did not respond to messaging.  I called him and spoke to him by phone.  Patient reports doing well.  He denies any significant changes or concerns.  No symptomatic complaints today.  He reports good appetite.  Stable performance status.  No issues with medications nor need for refills today.  Assessment and Plan: Stage IV lung cancer -currently on treatment with Avastin.  Followed by Dr. Rogue Bussing.  Seems to be tolerating treatments.  Will follow  ACP -patient discussing decision making with his family  Follow Up Instructions: Follow-up virtual visit 1 to 2 months   I discussed the assessment and treatment plan with the patient. The patient was provided an opportunity to ask questions and all were answered. The patient agreed with the plan and demonstrated an understanding of the  instructions.   The patient was advised to call back or seek an in-person evaluation if the symptoms worsen or if the condition fails to improve as anticipated.  I provided 5 minutes of non-face-to-face time during this encounter.   Irean Hong, NP

## 2020-01-02 NOTE — Telephone Encounter (Signed)
Copied from [place] [id number]. Topic: Quick Communication - Rx Refill/Question >> Jan 02, 2020  3:09 PM Mcneil, Ja-Kwan wrote: Medication: metFORMIN (GLUCOPHAGE) 500 MG tablet  Has the patient contacted their pharmacy? no  Preferred Pharmacy (with phone number or street name): [place], [place]  Phone: [phone]  Fax: [phone]  Agent: Please be advised that RX refills may take up to 3 business days. We ask that you follow-up with your pharmacy.

## 2020-01-03 ENCOUNTER — Inpatient Hospital Stay: Payer: Medicare Other

## 2020-01-03 ENCOUNTER — Encounter: Payer: Self-pay | Admitting: Internal Medicine

## 2020-01-03 ENCOUNTER — Inpatient Hospital Stay (HOSPITAL_BASED_OUTPATIENT_CLINIC_OR_DEPARTMENT_OTHER): Payer: Medicare Other | Admitting: Internal Medicine

## 2020-01-03 DIAGNOSIS — N183 Chronic kidney disease, stage 3 unspecified: Secondary | ICD-10-CM | POA: Diagnosis not present

## 2020-01-03 DIAGNOSIS — R972 Elevated prostate specific antigen [PSA]: Secondary | ICD-10-CM | POA: Diagnosis not present

## 2020-01-03 DIAGNOSIS — C3412 Malignant neoplasm of upper lobe, left bronchus or lung: Secondary | ICD-10-CM

## 2020-01-03 DIAGNOSIS — Z8673 Personal history of transient ischemic attack (TIA), and cerebral infarction without residual deficits: Secondary | ICD-10-CM | POA: Diagnosis not present

## 2020-01-03 DIAGNOSIS — Z8042 Family history of malignant neoplasm of prostate: Secondary | ICD-10-CM | POA: Diagnosis not present

## 2020-01-03 DIAGNOSIS — R413 Other amnesia: Secondary | ICD-10-CM | POA: Diagnosis not present

## 2020-01-03 DIAGNOSIS — C7931 Secondary malignant neoplasm of brain: Secondary | ICD-10-CM

## 2020-01-03 DIAGNOSIS — Z86711 Personal history of pulmonary embolism: Secondary | ICD-10-CM | POA: Diagnosis not present

## 2020-01-03 DIAGNOSIS — Z7901 Long term (current) use of anticoagulants: Secondary | ICD-10-CM | POA: Diagnosis not present

## 2020-01-03 DIAGNOSIS — Z818 Family history of other mental and behavioral disorders: Secondary | ICD-10-CM | POA: Diagnosis not present

## 2020-01-03 DIAGNOSIS — Z833 Family history of diabetes mellitus: Secondary | ICD-10-CM | POA: Diagnosis not present

## 2020-01-03 DIAGNOSIS — C7951 Secondary malignant neoplasm of bone: Secondary | ICD-10-CM | POA: Diagnosis not present

## 2020-01-03 DIAGNOSIS — Z86010 Personal history of colonic polyps: Secondary | ICD-10-CM | POA: Diagnosis not present

## 2020-01-03 DIAGNOSIS — Z8719 Personal history of other diseases of the digestive system: Secondary | ICD-10-CM | POA: Diagnosis not present

## 2020-01-03 DIAGNOSIS — M2550 Pain in unspecified joint: Secondary | ICD-10-CM | POA: Diagnosis not present

## 2020-01-03 DIAGNOSIS — Z8249 Family history of ischemic heart disease and other diseases of the circulatory system: Secondary | ICD-10-CM | POA: Diagnosis not present

## 2020-01-03 DIAGNOSIS — R5383 Other fatigue: Secondary | ICD-10-CM | POA: Diagnosis not present

## 2020-01-03 DIAGNOSIS — Z79899 Other long term (current) drug therapy: Secondary | ICD-10-CM | POA: Diagnosis not present

## 2020-01-03 DIAGNOSIS — E1122 Type 2 diabetes mellitus with diabetic chronic kidney disease: Secondary | ICD-10-CM | POA: Diagnosis not present

## 2020-01-03 DIAGNOSIS — E785 Hyperlipidemia, unspecified: Secondary | ICD-10-CM | POA: Diagnosis not present

## 2020-01-03 LAB — CBC WITH DIFFERENTIAL/PLATELET
Abs Immature Granulocytes: 0.01 10*3/uL (ref 0.00–0.07)
Basophils Absolute: 0 10*3/uL (ref 0.0–0.1)
Basophils Relative: 1 %
Eosinophils Absolute: 0.2 10*3/uL (ref 0.0–0.5)
Eosinophils Relative: 4 %
HCT: 39.4 % (ref 39.0–52.0)
Hemoglobin: 13.3 g/dL (ref 13.0–17.0)
Immature Granulocytes: 0 %
Lymphocytes Relative: 40 %
Lymphs Abs: 1.9 10*3/uL (ref 0.7–4.0)
MCH: 28.8 pg (ref 26.0–34.0)
MCHC: 33.8 g/dL (ref 30.0–36.0)
MCV: 85.3 fL (ref 80.0–100.0)
Monocytes Absolute: 0.6 10*3/uL (ref 0.1–1.0)
Monocytes Relative: 13 %
Neutro Abs: 2 10*3/uL (ref 1.7–7.7)
Neutrophils Relative %: 42 %
Platelets: 208 10*3/uL (ref 150–400)
RBC: 4.62 MIL/uL (ref 4.22–5.81)
RDW: 16.6 % — ABNORMAL HIGH (ref 11.5–15.5)
WBC: 4.6 10*3/uL (ref 4.0–10.5)
nRBC: 0 % (ref 0.0–0.2)

## 2020-01-03 LAB — COMPREHENSIVE METABOLIC PANEL
ALT: 25 U/L (ref 0–44)
AST: 25 U/L (ref 15–41)
Albumin: 3.9 g/dL (ref 3.5–5.0)
Alkaline Phosphatase: 49 U/L (ref 38–126)
Anion gap: 8 (ref 5–15)
BUN: 20 mg/dL (ref 8–23)
CO2: 24 mmol/L (ref 22–32)
Calcium: 9.1 mg/dL (ref 8.9–10.3)
Chloride: 106 mmol/L (ref 98–111)
Creatinine, Ser: 1.48 mg/dL — ABNORMAL HIGH (ref 0.61–1.24)
GFR calc Af Amer: 56 mL/min — ABNORMAL LOW (ref >60)
GFR calc non Af Amer: 49 mL/min — ABNORMAL LOW (ref >60)
Glucose, Bld: 199 mg/dL — ABNORMAL HIGH (ref 70–99)
Potassium: 4.2 mmol/L (ref 3.5–5.1)
Sodium: 138 mmol/L (ref 135–145)
Total Bilirubin: 0.7 mg/dL (ref 0.3–1.2)
Total Protein: 7 g/dL (ref 6.5–8.1)

## 2020-01-03 NOTE — Assessment & Plan Note (Addendum)
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  # DISPOSITION: # follow up in 66monthMD;labs- cbc/cmp;Dr.B  # I reviewed the blood work- with the patient in detail; also reviewed the imaging independently [as summarized above]; and with the patient in detail.

## 2020-01-03 NOTE — Progress Notes (Signed)
Pt in for follow up, states has some dizziness at times.

## 2020-01-03 NOTE — Progress Notes (Signed)
[facility] OFFICE PROGRESS NOTE  Patient Care Team: [name], [name], MD as Medical Oncologist (Medical Oncology) Cathi Roan, [facility] (Pharmacist)  Cancer Staging No matching staging information was found for the patient.   Oncology History Overview Note  # OCT 2017- ADENO CA LUNG; STAGE IV [; LUL; bil supraclavicular LN; Left neck LN Bx]; ROS-1 MUTATED; s/p Carbo-alimta x1[oct 2017]  # NOV 1st 2017- XALKORI 250 mg BID; JAN 15th CT- PR;  # AUG 27th Chemo-RT to persistent LUL/mediastinal LN [s/p carbo-taxol- with RT; finished Sep 22nd 2018];   # July 14th 2020- multiple metastatic lesions of the brain [July 30 whole brain radiation finished April 21, 2019]; bone scan skull metastases /posterior left ninth rib metastases; April 04, 2019 stopped crizotinib;  #April 25, 2019-start Entrectinib 600 mg once a day; STopped in NOV 2020 [dizziness]; NOV 2020-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   # LLE DVT/bil PE/Multiple strokes [? On xarelto]-Lovenox; Jan mid 2018- xarelto [lovenox-insurance issues]  # MRI brain- multiple infarcts [2d echo/bubble study-NEG's/p Neurology eval]  ------------------------------------------------------------    # # s/p TURP [Sep 2017; Dr.Cope] DEC 26th CT- distended bladder   # MOLECULAR TESTING- ROS-1 POSITIVE; ALK/EGFR-NEG; PDL-1 EXPRESSION- 90%** [HIGH]  # DEC 15th 2020- PALLIATIVE CARE -----------------------------------------------------------------    DIAGNOSIS: Adenocarcinoma the lung  ROS-1 +  STAGE:  IV    ;GOALS: Palliative  CURRENT/MOST RECENT THERAPY- Avastin [C]   Primary cancer of left upper lobe of lung ([place])  08/09/2019 -   Chemotherapy   The patient had bevacizumab-awwb (MVASI) 800 mg in sodium chloride 0.9 % 100 mL chemo infusion, 725 mg, Intravenous,  Once, 6 of 6 cycles Dose modification: 10 mg/kg (original dose 10 mg/kg, Cycle 3, Reason: Other (see comments), Comment: weight change) Administration: 800 mg (08/09/2019), 800 mg (08/22/2019), 700 mg (09/19/2019), 700 mg (10/04/2019), 700 mg (10/18/2019), 700 mg (11/01/2019)  for chemotherapy treatment.        INTERVAL HISTORY:  MARSELINO Willis 69 y.o.  male pleasant patient above history of metastatic lung cancer-to brain Ros-1 positive-currently on Rozyltrk [2 pills] s here for follow-up/reveals of the CT scan.  Patient denies any nausea vomiting with any headaches.  Denies any dizzy spells.  No swelling in the legs.  Review of Systems  Constitutional: Positive for malaise/fatigue. Negative for chills, diaphoresis, fever and weight loss.  HENT: Negative for nosebleeds and sore throat.   Eyes: Negative for double vision.  Respiratory: Negative for cough, hemoptysis, sputum production, shortness of breath and wheezing.   Cardiovascular: Negative for chest pain (Left upper chest wall pain chronic), palpitations and orthopnea.  Gastrointestinal: Negative for abdominal pain, blood in stool, constipation, diarrhea, heartburn and melena.  Genitourinary: Negative for dysuria.  Musculoskeletal: Positive for joint pain. Negative for back pain.  Skin: Negative.  Negative for itching and rash.  Neurological: Negative for tingling, focal weakness and weakness.  Endo/Heme/Allergies: Does not bruise/bleed easily.  Psychiatric/Behavioral: Positive for memory loss. Negative for depression. The patient is not nervous/anxious and does not have insomnia.     PAST MEDICAL HISTORY :  Past Medical History:  Diagnosis Date  . Abnormal prostate specific antigen 08/08/2012  . Adiposity 04/16/2015  . Chronic kidney disease (CKD), stage III (moderate)  11/27/2016  . CVA (cerebral  vascular accident) ([place]) 06/17/2016  . Diabetes mellitus without complication ([place])   . Diverticulosis of sigmoid colon 04/16/2015  . Dyslipidemia 03/18/2015  . Hemorrhoids, internal 04/16/2015  . Hypercholesteremia 04/16/2015  . Hyperlipidemia   . Hypertension   . Primary cancer of left upper lobe of lung ([place])   . Pulmonary embolism ([place])   . Wears dentures    partial upper    PAST SURGICAL HISTORY :   Past Surgical History:  Procedure Laterality Date  . COLONOSCOPY    . COLONOSCOPY WITH PROPOFOL N/A 05/06/2015   Procedure: COLONOSCOPY WITH PROPOFOL;  Surgeon: Lucilla Lame, MD;  Location: [facility] [place];  Service: Endoscopy;  Laterality: N/A;  ASCENDING COLON POLYPS X 2 TERMINAL ILEUM BIOPSY RANDOM COLON BX. TRANSVERSE COLON POLYP SIGMOID COLON POLYP  . ESOPHAGOGASTRODUODENOSCOPY (EGD) WITH PROPOFOL N/A 05/06/2015   Procedure: ESOPHAGOGASTRODUODENOSCOPY (EGD) WITH PROPOFOL;  Surgeon: Lucilla Lame, MD;  Location: [facility];  Service: Endoscopy;  Laterality: N/A;  GASTRIC BIOPSY X1  . KIDNEY STONE SURGERY  2017    FAMILY HISTORY :   Family History  Problem Relation Age of Onset  . Diabetes Mother   . Diabetes Father   . CAD Father   . Dementia Father   . Diabetes Sister   . Cancer Maternal Uncle        Prostate  . Cancer Cousin        prostate    SOCIAL HISTORY:   Social History   Tobacco Use  . Smoking status: Never Smoker  . Smokeless tobacco: Never Used  Substance Use Topics  . Alcohol use: No    Alcohol/week: 0.0 standard drinks  . Drug use: No    ALLERGIES:  has No Known Allergies.  MEDICATIONS:  Current Outpatient Medications  Medication Sig Dispense Refill  . atorvastatin (LIPITOR) 40 MG tablet Take 1 tablet (40 mg total) by mouth 3 (three) times a week. 48 tablet 1  . blood glucose meter kit and supplies Dispense based on patient and insurance preference (Accuchek Aviva Plus, Accuchek Nano). Use up to four times daily as directed. (FOR ICD-10  E10.9, E11.9). 1 each 0  . entrectinib (ROZLYTREK) 200 MG capsule Take 3 capsules (600 mg total) by mouth daily. 90 capsule 6  . tamsulosin (FLOMAX) 0.4 MG CAPS capsule Take 1 capsule by mouth once daily in the morning 90 capsule 3  . XARELTO 20 MG TABS tablet TAKE 1 TABLET BY MOUTH ONCE DAILY WITH SUPPER 30 tablet 6  . metFORMIN (GLUCOPHAGE) 500 MG tablet TAKE 1 TABLET BY MOUTH TWICE DAILY WITH A MEAL 180 tablet 0  . ondansetron (ZOFRAN) 8 MG tablet One pill every 8 hours as needed for nausea/vomitting. (Patient not taking: Reported on 01/03/2020) 40 tablet 1   No current facility-administered medications for this visit.    PHYSICAL EXAMINATION: ECOG PERFORMANCE STATUS: 0 - Asymptomatic  BP 138/84 (BP Location: Left Arm, Patient Position: Sitting)   Pulse 71   Temp (!) 97.5 F (36.4 C) (Tympanic)   Resp 18   Wt 158 lb 3.2 oz (71.8 kg)   SpO2 96%   BMI 27.15 kg/m   Filed Weights   01/03/20 1506  Weight: 158 lb 3.2 oz (71.8 kg)    Physical Exam  Constitutional: He is oriented to person, place, and time and well-developed, well-nourished, and in no distress.  Patient is accompanied by his wife.  He is walking by himself.  HENT:  Head: Normocephalic and  atraumatic.  Mouth/Throat: Oropharynx is clear and moist. No oropharyngeal exudate.  Eyes: Pupils are equal, round, and reactive to light.  Cardiovascular: Normal rate and regular rhythm.  Pulmonary/Chest: No respiratory distress. He has no wheezes.  Abdominal: Soft. Bowel sounds are normal. He exhibits no distension and no mass. There is no abdominal tenderness. There is no rebound and no guarding.  Musculoskeletal:        General: No tenderness or edema. Normal range of motion.     Cervical back: Normal range of motion and neck supple.  Neurological: He is alert and oriented to person, place, and time.  Skin: Skin is warm.  Psychiatric: Affect normal.   LABORATORY DATA:  I have reviewed the data as listed    Component  Value Date/Time   NA 138 01/03/2020 1434   NA 139 01/01/2016 1000   K 4.2 01/03/2020 1434   CL 106 01/03/2020 1434   CO2 24 01/03/2020 1434   GLUCOSE 199 (H) 01/03/2020 1434   BUN 20 01/03/2020 1434   BUN 13 01/01/2016 1000   CREATININE 1.48 (H) 01/03/2020 1434   CREATININE 1.32 (H) 08/22/2018 1107   CALCIUM 9.1 01/03/2020 1434   PROT 7.0 01/03/2020 1434   PROT 7.2 01/01/2016 1000   ALBUMIN 3.9 01/03/2020 1434   ALBUMIN 4.4 01/01/2016 1000   AST 25 01/03/2020 1434   ALT 25 01/03/2020 1434   ALKPHOS 49 01/03/2020 1434   BILITOT 0.7 01/03/2020 1434   BILITOT 0.4 01/01/2016 1000   GFRNONAA 49 (L) 01/03/2020 1434   GFRNONAA 56 (L) 08/22/2018 1107   GFRAA 56 (L) 01/03/2020 1434   GFRAA 65 08/22/2018 1107    No results found for: SPEP, UPEP  Lab Results  Component Value Date   WBC 4.6 01/03/2020   NEUTROABS 2.0 01/03/2020   HGB 13.3 01/03/2020   HCT 39.4 01/03/2020   MCV 85.3 01/03/2020   PLT 208 01/03/2020      Chemistry      Component Value Date/Time   NA 138 01/03/2020 1434   NA 139 01/01/2016 1000   K 4.2 01/03/2020 1434   CL 106 01/03/2020 1434   CO2 24 01/03/2020 1434   BUN 20 01/03/2020 1434   BUN 13 01/01/2016 1000   CREATININE 1.48 (H) 01/03/2020 1434   CREATININE 1.32 (H) 08/22/2018 1107      Component Value Date/Time   CALCIUM 9.1 01/03/2020 1434   ALKPHOS 49 01/03/2020 1434   AST 25 01/03/2020 1434   ALT 25 01/03/2020 1434   BILITOT 0.7 01/03/2020 1434   BILITOT 0.4 01/01/2016 1000       RADIOGRAPHIC STUDIES: I have personally reviewed the radiological images as listed and agreed with the findings in the report. No results found.   ASSESSMENT & PLAN:  Primary cancer of left upper lobe of lung [facility]) # Adenocarcinoma of the lung metastatic/stage IV-ROS-1 positive: January 01, 2020 CT chest left upper lobe treatment changes; S/p AVASTIN x 6-February 2021 -improved MRI brain lesions.  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  # DISPOSITION: # follow up in 50monthMD;labs- cbc/cmp;Dr.B  # I reviewed the blood work- with the patient in detail; also reviewed the imaging independently [as summarized above]; and with the patient in detail.      Orders Placed  This Encounter  Procedures  . Comprehensive metabolic panel    Standing Status:   Future    Standing Expiration Date:   01/02/2021  . CBC with Differential    Standing Status:   Future    Standing Expiration Date:   01/02/2021   All questions were answered. The patient knows to call the clinic with any problems, questions or concerns.      Cammie Sickle, MD 01/07/2020 5:05 PM

## 2020-01-07 ENCOUNTER — Other Ambulatory Visit: Payer: Self-pay | Admitting: Family Medicine

## 2020-01-07 DIAGNOSIS — E1169 Type 2 diabetes mellitus with other specified complication: Secondary | ICD-10-CM

## 2020-01-07 NOTE — Telephone Encounter (Signed)
Previous refill was not sent electronically "printed" instead- Requested Prescriptions  Pending Prescriptions Disp Refills  . metFORMIN (GLUCOPHAGE) 500 MG tablet [Pharmacy Med Name: metFORMIN HCl 500 MG Oral Tablet] 180 tablet 0    Sig: TAKE 1 TABLET BY MOUTH TWICE DAILY WITH A MEAL     Endocrinology:  Diabetes - Biguanides Failed - 01/07/2020 12:35 PM      Failed - Cr in normal range and within 360 days    Creat  Date Value Ref Range Status  08/22/2018 1.32 (H) 0.70 - 1.25 mg/dL Final    Comment:    For patients >71 years of age, the reference limit for Creatinine is approximately 13% higher for people identified as African-American. .    Creatinine, Ser  Date Value Ref Range Status  01/03/2020 1.48 (H) 0.61 - 1.24 mg/dL Final   Creatinine, Urine  Date Value Ref Range Status  01/04/2017 85 20 - 370 mg/dL Final         Failed - HBA1C is between 0 and 7.9 and within 180 days    HbA1c, POC (controlled diabetic range)  Date Value Ref Range Status  04/07/2019 9.8 (A) 0.0 - 7.0 % Final         Failed - eGFR in normal range and within 360 days    GFR, Est African American  Date Value Ref Range Status  08/22/2018 65 > OR = 60 mL/min/1.54m Final   GFR calc Af Amer  Date Value Ref Range Status  01/03/2020 56 (L) >60 mL/min Final   GFR, Est Non African American  Date Value Ref Range Status  08/22/2018 56 (L) > OR = 60 mL/min/1.72mFinal   GFR calc non Af Amer  Date Value Ref Range Status  01/03/2020 49 (L) >60 mL/min Final         Passed - Valid encounter within last 6 months    Recent Outpatient Visits          4 months ago Acute diverticulitis   [facility] F[facility]   5 months ago [facility] type 2 [facility] dys[facility]  [facility]   9 months ago Type 2 diabetes mellitus with stage 3 chronic kidney disease, without long-term [facility] use of [facility]  [facility]ager[facility]   1 year ago Type 2 diabetes mellitus with stage 3 chronic kidney disease, without long-term current use of insulin ([facility]  [facility]   1 year ago [facility]   [facility]      Future Appointments            In 1 month Borders, JoKirt BoysNP [facility]   In 5 months SnDiamantina ProvidenceBrHerbert SetaMD [facility]

## 2020-01-31 ENCOUNTER — Other Ambulatory Visit: Payer: Self-pay

## 2020-01-31 ENCOUNTER — Encounter: Payer: Self-pay | Admitting: Internal Medicine

## 2020-01-31 ENCOUNTER — Inpatient Hospital Stay: Payer: Medicare Other | Attending: Internal Medicine

## 2020-01-31 ENCOUNTER — Inpatient Hospital Stay (HOSPITAL_BASED_OUTPATIENT_CLINIC_OR_DEPARTMENT_OTHER): Payer: Medicare Other | Admitting: Internal Medicine

## 2020-01-31 VITALS — BP 140/85 | HR 70 | Temp 98.0°F | Resp 18 | Wt 160.4 lb

## 2020-01-31 DIAGNOSIS — C7951 Secondary malignant neoplasm of bone: Secondary | ICD-10-CM | POA: Insufficient documentation

## 2020-01-31 DIAGNOSIS — Z8673 Personal history of transient ischemic attack (TIA), and cerebral infarction without residual deficits: Secondary | ICD-10-CM | POA: Insufficient documentation

## 2020-01-31 DIAGNOSIS — R413 Other amnesia: Secondary | ICD-10-CM | POA: Diagnosis not present

## 2020-01-31 DIAGNOSIS — I82402 Acute embolism and thrombosis of unspecified deep veins of left lower extremity: Secondary | ICD-10-CM | POA: Insufficient documentation

## 2020-01-31 DIAGNOSIS — Z8042 Family history of malignant neoplasm of prostate: Secondary | ICD-10-CM | POA: Insufficient documentation

## 2020-01-31 DIAGNOSIS — Z7984 Long term (current) use of oral hypoglycemic drugs: Secondary | ICD-10-CM | POA: Insufficient documentation

## 2020-01-31 DIAGNOSIS — Z833 Family history of diabetes mellitus: Secondary | ICD-10-CM | POA: Diagnosis not present

## 2020-01-31 DIAGNOSIS — C7931 Secondary malignant neoplasm of brain: Secondary | ICD-10-CM

## 2020-01-31 DIAGNOSIS — E1122 Type 2 diabetes mellitus with diabetic chronic kidney disease: Secondary | ICD-10-CM | POA: Diagnosis not present

## 2020-01-31 DIAGNOSIS — Z7901 Long term (current) use of anticoagulants: Secondary | ICD-10-CM | POA: Diagnosis not present

## 2020-01-31 DIAGNOSIS — R269 Unspecified abnormalities of gait and mobility: Secondary | ICD-10-CM | POA: Diagnosis not present

## 2020-01-31 DIAGNOSIS — Z86711 Personal history of pulmonary embolism: Secondary | ICD-10-CM | POA: Diagnosis not present

## 2020-01-31 DIAGNOSIS — Z6827 Body mass index (BMI) 27.0-27.9, adult: Secondary | ICD-10-CM | POA: Diagnosis not present

## 2020-01-31 DIAGNOSIS — C778 Secondary and unspecified malignant neoplasm of lymph nodes of multiple regions: Secondary | ICD-10-CM | POA: Insufficient documentation

## 2020-01-31 DIAGNOSIS — Z8249 Family history of ischemic heart disease and other diseases of the circulatory system: Secondary | ICD-10-CM | POA: Insufficient documentation

## 2020-01-31 DIAGNOSIS — Z86010 Personal history of colonic polyps: Secondary | ICD-10-CM | POA: Diagnosis not present

## 2020-01-31 DIAGNOSIS — C3412 Malignant neoplasm of upper lobe, left bronchus or lung: Secondary | ICD-10-CM | POA: Diagnosis not present

## 2020-01-31 DIAGNOSIS — Z79899 Other long term (current) drug therapy: Secondary | ICD-10-CM | POA: Insufficient documentation

## 2020-01-31 DIAGNOSIS — Z8719 Personal history of other diseases of the digestive system: Secondary | ICD-10-CM | POA: Insufficient documentation

## 2020-01-31 DIAGNOSIS — N183 Chronic kidney disease, stage 3 unspecified: Secondary | ICD-10-CM | POA: Diagnosis not present

## 2020-01-31 DIAGNOSIS — M2550 Pain in unspecified joint: Secondary | ICD-10-CM | POA: Insufficient documentation

## 2020-01-31 DIAGNOSIS — R5383 Other fatigue: Secondary | ICD-10-CM | POA: Insufficient documentation

## 2020-01-31 DIAGNOSIS — Z818 Family history of other mental and behavioral disorders: Secondary | ICD-10-CM | POA: Insufficient documentation

## 2020-01-31 LAB — CBC WITH DIFFERENTIAL/PLATELET
Abs Immature Granulocytes: 0.02 K/uL (ref 0.00–0.07)
Basophils Absolute: 0 K/uL (ref 0.0–0.1)
Basophils Relative: 0 %
Eosinophils Absolute: 0.2 K/uL (ref 0.0–0.5)
Eosinophils Relative: 4 %
HCT: 40.8 % (ref 39.0–52.0)
Hemoglobin: 14.1 g/dL (ref 13.0–17.0)
Immature Granulocytes: 0 %
Lymphocytes Relative: 38 %
Lymphs Abs: 2.1 K/uL (ref 0.7–4.0)
MCH: 29.8 pg (ref 26.0–34.0)
MCHC: 34.6 g/dL (ref 30.0–36.0)
MCV: 86.3 fL (ref 80.0–100.0)
Monocytes Absolute: 0.7 K/uL (ref 0.1–1.0)
Monocytes Relative: 14 %
Neutro Abs: 2.4 K/uL (ref 1.7–7.7)
Neutrophils Relative %: 44 %
Platelets: 197 K/uL (ref 150–400)
RBC: 4.73 MIL/uL (ref 4.22–5.81)
RDW: 17 % — ABNORMAL HIGH (ref 11.5–15.5)
WBC: 5.5 K/uL (ref 4.0–10.5)
nRBC: 0 % (ref 0.0–0.2)

## 2020-01-31 LAB — COMPREHENSIVE METABOLIC PANEL WITH GFR
ALT: 22 U/L (ref 0–44)
AST: 20 U/L (ref 15–41)
Albumin: 4.2 g/dL (ref 3.5–5.0)
Alkaline Phosphatase: 51 U/L (ref 38–126)
Anion gap: 8 (ref 5–15)
BUN: 22 mg/dL (ref 8–23)
CO2: 23 mmol/L (ref 22–32)
Calcium: 9.3 mg/dL (ref 8.9–10.3)
Chloride: 106 mmol/L (ref 98–111)
Creatinine, Ser: 1.72 mg/dL — ABNORMAL HIGH (ref 0.61–1.24)
GFR calc Af Amer: 47 mL/min — ABNORMAL LOW
GFR calc non Af Amer: 41 mL/min — ABNORMAL LOW
Glucose, Bld: 126 mg/dL — ABNORMAL HIGH (ref 70–99)
Potassium: 4.4 mmol/L (ref 3.5–5.1)
Sodium: 137 mmol/L (ref 135–145)
Total Bilirubin: 0.8 mg/dL (ref 0.3–1.2)
Total Protein: 7.5 g/dL (ref 6.5–8.1)

## 2020-01-31 NOTE — Progress Notes (Signed)
Pt in for follow up, caregiver reports patient is "leaning forward a lot, more weak in legs".

## 2020-01-31 NOTE — Progress Notes (Signed)
[facility] OFFICE PROGRESS NOTE  Patient Care Team: [name], [name], MD as Medical Oncologist (Medical Oncology) Cathi Roan, [facility] (Pharmacist)  Cancer Staging No matching staging information was found for the patient.   Oncology History Overview Note  # OCT 2017- ADENO CA LUNG; STAGE IV [; LUL; bil supraclavicular LN; Left neck LN Bx]; ROS-1 MUTATED; s/p Carbo-alimta x1[oct 2017]  # NOV 1st 2017- XALKORI 250 mg BID; JAN 15th CT- PR;  # AUG 27th Chemo-RT to persistent LUL/mediastinal LN [s/p carbo-taxol- with RT; finished Sep 22nd 2018];   # July 14th 2020- multiple metastatic lesions of the brain [July 30 whole brain radiation finished April 21, 2019]; bone scan skull metastases /posterior left ninth rib metastases; April 04, 2019 stopped crizotinib;  #April 25, 2019-start Entrectinib 600 mg once a day; STopped in NOV 2020 [dizziness]; NOV 2020-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   # LLE DVT/bil PE/Multiple strokes [? On xarelto]-Lovenox; Jan mid 2018- xarelto [lovenox-insurance issues]  # MRI brain- multiple infarcts [2d echo/bubble study-NEG's/p Neurology eval]  ------------------------------------------------------------    # # s/p TURP [Sep 2017; Dr.Cope] DEC 26th CT- distended bladder;  # Elevated PSA-  FEB 2021- 12;  S/p Bx- Negative; follow with urology/Dr.Stoioff   # MOLECULAR TESTING- ROS-1 POSITIVE; ALK/EGFR-NEG; PDL-1 EXPRESSION- 90%** [HIGH]  # DEC 15th 2020- PALLIATIVE CARE -----------------------------------------------------------------    DIAGNOSIS: Adenocarcinoma the lung  ROS-1 +  STAGE:  IV    ;GOALS: Palliative  CURRENT/MOST RECENT  THERAPY- Avastin [C]   Primary cancer of left upper lobe of lung ([place])  08/09/2019 -  Chemotherapy   The patient had bevacizumab-awwb (MVASI) 800 mg in sodium chloride 0.9 % 100 mL chemo infusion, 725 mg, Intravenous,  Once, 6 of 6 cycles Dose modification: 10 mg/kg (original dose 10 mg/kg, Cycle 3, Reason: Other (see comments), Comment: weight change) Administration: 800 mg (08/09/2019), 800 mg (08/22/2019), 700 mg (09/19/2019), 700 mg (10/04/2019), 700 mg (10/18/2019), 700 mg (11/01/2019)  for chemotherapy treatment.        INTERVAL HISTORY:  [facility] 69 y.o.  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  As per the wife patient has been stooping/shuffling gait over the last few weeks.  No falls.  Patient denies any similar gait/postural problems.  No nausea no vomiting but no headaches.  Intermittent swelling of the legs.  Review of Systems  Constitutional: Positive for malaise/fatigue. Negative for chills, diaphoresis, fever and weight loss.  HENT: Negative for nosebleeds and sore throat.   Eyes: Negative for double vision.  Respiratory: Negative for cough, hemoptysis, sputum production, shortness of breath and wheezing.   Cardiovascular: Negative for chest pain (Left upper chest wall pain chronic), palpitations and orthopnea.  Gastrointestinal: Negative for abdominal pain, blood in stool, constipation, diarrhea, heartburn and melena.  Genitourinary: Negative for dysuria.  Musculoskeletal: Positive for joint pain. Negative for back pain.  Skin: Negative.  Negative for itching and rash.  Neurological: Negative for tingling, focal weakness and weakness.  Endo/Heme/Allergies: Does not bruise/bleed easily.  Psychiatric/Behavioral: Positive for memory loss. Negative for depression. The patient is not nervous/anxious and does not have insomnia.     PAST  MEDICAL HISTORY :  Past Medical  History:  Diagnosis Date  . Abnormal prostate specific antigen 08/08/2012  . Adiposity 04/16/2015  . Chronic kidney disease (CKD), stage III (moderate) 11/27/2016  . CVA (cerebral vascular accident) ([place]) 06/17/2016  . Diabetes mellitus without complication ([place])   . Diverticulosis of sigmoid colon 04/16/2015  . Dyslipidemia 03/18/2015  . Hemorrhoids, internal 04/16/2015  . Hypercholesteremia 04/16/2015  . Hyperlipidemia   . Hypertension   . Primary cancer of left upper lobe of lung ([place])   . Pulmonary embolism ([place])   . Wears dentures    partial upper    PAST SURGICAL HISTORY :   Past Surgical History:  Procedure Laterality Date  . COLONOSCOPY    . COLONOSCOPY WITH PROPOFOL N/A 05/06/2015   Procedure: COLONOSCOPY WITH PROPOFOL;  Surgeon: Lucilla Lame, MD;  Location: [facility];  Service: Endoscopy;  Laterality: N/A;  ASCENDING COLON POLYPS X 2 TERMINAL ILEUM BIOPSY RANDOM COLON BX. TRANSVERSE COLON POLYP SIGMOID COLON POLYP  . ESOPHAGOGASTRODUODENOSCOPY (EGD) WITH PROPOFOL N/A 05/06/2015   Procedure: ESOPHAGOGASTRODUODENOSCOPY (EGD) WITH PROPOFOL;  Surgeon: Lucilla Lame, MD;  Location: [facility];  Service: Endoscopy;  Laterality: N/A;  GASTRIC BIOPSY X1  . KIDNEY STONE SURGERY  2017    FAMILY HISTORY :   Family History  Problem Relation Age of Onset  . Diabetes Mother   . Diabetes Father   . CAD Father   . Dementia Father   . Diabetes Sister   . Cancer Maternal Uncle        Prostate  . Cancer Cousin        prostate    SOCIAL HISTORY:   Social History   Tobacco Use  . Smoking status: Never Smoker  . Smokeless tobacco: Never Used  Substance Use Topics  . Alcohol use: No    Alcohol/week: 0.0 standard drinks  . Drug use: No    ALLERGIES:  has No Known Allergies.  MEDICATIONS:  Current Outpatient Medications  Medication Sig Dispense Refill  . atorvastatin (LIPITOR) 40 MG tablet Take 1 tablet (40 mg total) by mouth 3 (three) times a week. 48 tablet 1   . blood glucose meter kit and supplies Dispense based on patient and insurance preference (Accuchek Aviva Plus, Accuchek Nano). Use up to four times daily as directed. (FOR ICD-10 E10.9, E11.9). 1 each 0  . entrectinib (ROZLYTREK) 200 MG capsule Take 3 capsules (600 mg total) by mouth daily. 90 capsule 6  . metFORMIN (GLUCOPHAGE) 500 MG tablet TAKE 1 TABLET BY MOUTH TWICE DAILY WITH A MEAL 180 tablet 0  . tamsulosin (FLOMAX) 0.4 MG CAPS capsule Take 1 capsule by mouth once daily in the morning 90 capsule 3  . XARELTO 20 MG TABS tablet TAKE 1 TABLET BY MOUTH ONCE DAILY WITH SUPPER 30 tablet 6  . ondansetron (ZOFRAN) 8 MG tablet One pill every 8 hours as needed for nausea/vomitting. (Patient not taking: Reported on 01/03/2020) 40 tablet 1   No current facility-administered medications for this visit.    PHYSICAL EXAMINATION: ECOG PERFORMANCE STATUS: 0 - Asymptomatic  BP 140/85 (BP Location: Left Arm, Patient Position: Sitting)   Pulse 70   Temp 98 F (36.7 C) (Oral)   Resp 18   Wt 160 lb 6.4 oz (72.8 kg)   SpO2 98%   BMI 27.53 kg/m   Filed Weights   01/31/20 1012  Weight: 160 lb 6.4 oz (72.8 kg)    Physical Exam  Constitutional: He is oriented to person,  place, and time and well-developed, well-nourished, and in no distress.  Patient is accompanied by his wife.  He is walking by himself.  HENT:  Head: Normocephalic and atraumatic.  Mouth/Throat: Oropharynx is clear and moist. No oropharyngeal exudate.  Eyes: Pupils are equal, round, and reactive to light.  Cardiovascular: Normal rate and regular rhythm.  Pulmonary/Chest: No respiratory distress. He has no wheezes.  Abdominal: Soft. Bowel sounds are normal. He exhibits no distension and no mass. There is no abdominal tenderness. There is no rebound and no guarding.  Musculoskeletal:        General: No tenderness or edema. Normal range of motion.     Cervical back: Normal range of motion and neck supple.  Neurological: He is  alert and oriented to person, place, and time.  Skin: Skin is warm.  Psychiatric: Affect normal.   LABORATORY DATA:  I have reviewed the data as listed    Component Value Date/Time   NA 137 01/31/2020 0938   NA 139 01/01/2016 1000   K 4.4 01/31/2020 0938   CL 106 01/31/2020 0938   CO2 23 01/31/2020 0938   GLUCOSE 126 (H) 01/31/2020 0938   BUN 22 01/31/2020 0938   BUN 13 01/01/2016 1000   CREATININE 1.72 (H) 01/31/2020 0938   CREATININE 1.32 (H) 08/22/2018 1107   CALCIUM 9.3 01/31/2020 0938   PROT 7.5 01/31/2020 0938   PROT 7.2 01/01/2016 1000   ALBUMIN 4.2 01/31/2020 0938   ALBUMIN 4.4 01/01/2016 1000   AST 20 01/31/2020 0938   ALT 22 01/31/2020 0938   ALKPHOS 51 01/31/2020 0938   BILITOT 0.8 01/31/2020 0938   BILITOT 0.4 01/01/2016 1000   GFRNONAA 41 (L) 01/31/2020 0938   GFRNONAA 56 (L) 08/22/2018 1107   GFRAA 47 (L) 01/31/2020 0938   GFRAA 65 08/22/2018 1107    No results found for: SPEP, UPEP  Lab Results  Component Value Date   WBC 5.5 01/31/2020   NEUTROABS 2.4 01/31/2020   HGB 14.1 01/31/2020   HCT 40.8 01/31/2020   MCV 86.3 01/31/2020   PLT 197 01/31/2020      Chemistry      Component Value Date/Time   NA 137 01/31/2020 0938   NA 139 01/01/2016 1000   K 4.4 01/31/2020 0938   CL 106 01/31/2020 0938   CO2 23 01/31/2020 0938   BUN 22 01/31/2020 0938   BUN 13 01/01/2016 1000   CREATININE 1.72 (H) 01/31/2020 0938   CREATININE 1.32 (H) 08/22/2018 1107      Component Value Date/Time   CALCIUM 9.3 01/31/2020 0938   ALKPHOS 51 01/31/2020 0938   AST 20 01/31/2020 0938   ALT 22 01/31/2020 0938   BILITOT 0.8 01/31/2020 0938   BILITOT 0.4 01/01/2016 1000       RADIOGRAPHIC STUDIES: I have personally reviewed the radiological images as listed and agreed with the findings in the report. No results found.   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  STABLE.   # Continue 2 pills Rozyltek [creatinine 1.7]; continue 2 pills a day  # Bilateral PE & left lower extremity DVT:  on Xarelto- STABLE.   #CKD stage III-creatinine 1.7; monitor closely.  #Alteration of gait/posture-as per wife; will monitor closely.  # DISPOSITION: # follow up in 28monthMD;labs- cbc/cmp;MRI Brain  prior-Dr.B      Orders Placed This Encounter  Procedures  . MR Brain W Wo Contrast    Standing Status:   Future    Standing Expiration Date:   01/30/2021    Order Specific Question:   ** REASON FOR EXAM (FREE TEXT)    Answer:   brain metastses    Order Specific Question:   If indicated for the ordered procedure, I authorize the administration of contrast media per Radiology protocol    Answer:   Yes    Order Specific Question:   What is the patient's sedation requirement?    Answer:   No Sedation    Order Specific Question:   Does the patient have a pacemaker or implanted devices?    Answer:   No    Order Specific Question:   Use SRS Protocol?    Answer:   No    Order Specific Question:   Radiology Contrast Protocol - do NOT remove file path    Answer:   [email or web address]    Order Specific Question:   Preferred imaging location?    Answer:   [facility] (table limit - 550lbs)  . CBC with Differential    Standing Status:   Future    Standing Expiration Date:   01/30/2021  . Comprehensive metabolic panel    Standing Status:   Future    Standing Expiration Date:   01/30/2021   All questions were answered. The patient knows to call the clinic with any problems, questions or concerns.      Cammie Sickle, MD 01/31/2020 12:52 PM

## 2020-01-31 NOTE — Assessment & Plan Note (Addendum)
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  STABLE.   # Continue 2 pills Rozyltek [creatinine 1.7]; continue 2 pills a day  # Bilateral PE & left lower extremity DVT:  on Xarelto- STABLE.   #CKD stage III-creatinine 1.7; monitor closely.  #Alteration of gait/posture-as per wife; will monitor closely.  # DISPOSITION: # follow up in 27monthMD;labs- cbc/cmp;MRI Brain prior-Dr.B

## 2020-02-19 ENCOUNTER — Other Ambulatory Visit: Payer: Self-pay

## 2020-02-19 ENCOUNTER — Ambulatory Visit
Admission: RE | Admit: 2020-02-19 | Discharge: 2020-02-19 | Disposition: A | Discharge status: Home / Self Care | Payer: Medicare Other | Source: Ambulatory Visit | Attending: Internal Medicine | Admitting: Internal Medicine

## 2020-02-19 ENCOUNTER — Telehealth: Payer: Self-pay | Admitting: Internal Medicine

## 2020-02-19 DIAGNOSIS — C7931 Secondary malignant neoplasm of brain: Secondary | ICD-10-CM | POA: Insufficient documentation

## 2020-02-19 DIAGNOSIS — C3412 Malignant neoplasm of upper lobe, left bronchus or lung: Secondary | ICD-10-CM | POA: Diagnosis not present

## 2020-02-19 DIAGNOSIS — R42 Dizziness and giddiness: Secondary | ICD-10-CM | POA: Diagnosis not present

## 2020-02-19 MED ORDER — GADOBUTROL 1 MMOL/ML IV SOLN
6.0000 mL | Freq: Once | INTRAVENOUS | Status: AC | PRN
  Administered 2020-02-19: 6 mL via INTRAVENOUS

## 2020-02-19 NOTE — Telephone Encounter (Signed)
On 6/14-spoke to patient/wife regarding improved results of the MRI brain.  Patient concerned about tingling and numbness in extremities; given the option of evaluation at the [facility].  Patient wants to wait until next visit with me.   GB

## 2020-02-29 ENCOUNTER — Encounter: Payer: Self-pay | Admitting: Internal Medicine

## 2020-02-29 ENCOUNTER — Other Ambulatory Visit: Payer: Self-pay

## 2020-02-29 NOTE — Progress Notes (Signed)
Patient reports numbness and tingling in his feet x 2 weeks. Patient reports pain in legs when standing up initially to walk. Patient c/o intermittent episodes of diarrhea 1-2 times a week.patient is taking Rozlytrek twice daily as directed and has not missed any dosages. C/o intermittent dizziness.   Pt has an upcoming apt with Merrily Pew, NP via mychart. Patient would like information given to his wife on "how to do the my chart visit."

## 2020-03-01 ENCOUNTER — Inpatient Hospital Stay (HOSPITAL_BASED_OUTPATIENT_CLINIC_OR_DEPARTMENT_OTHER): Payer: Medicare Other | Admitting: Internal Medicine

## 2020-03-01 ENCOUNTER — Inpatient Hospital Stay: Payer: Medicare Other | Attending: Internal Medicine

## 2020-03-01 VITALS — BP 149/80 | HR 72 | Temp 98.0°F | Wt 163.2 lb

## 2020-03-01 DIAGNOSIS — E0822 Diabetes mellitus due to underlying condition with diabetic chronic kidney disease: Secondary | ICD-10-CM | POA: Diagnosis not present

## 2020-03-01 DIAGNOSIS — E1122 Type 2 diabetes mellitus with diabetic chronic kidney disease: Secondary | ICD-10-CM | POA: Diagnosis not present

## 2020-03-01 DIAGNOSIS — E1140 Type 2 diabetes mellitus with diabetic neuropathy, unspecified: Secondary | ICD-10-CM | POA: Insufficient documentation

## 2020-03-01 DIAGNOSIS — Z86010 Personal history of colonic polyps: Secondary | ICD-10-CM | POA: Insufficient documentation

## 2020-03-01 DIAGNOSIS — R972 Elevated prostate specific antigen [PSA]: Secondary | ICD-10-CM | POA: Diagnosis not present

## 2020-03-01 DIAGNOSIS — C7931 Secondary malignant neoplasm of brain: Secondary | ICD-10-CM

## 2020-03-01 DIAGNOSIS — Z8719 Personal history of other diseases of the digestive system: Secondary | ICD-10-CM | POA: Insufficient documentation

## 2020-03-01 DIAGNOSIS — C3412 Malignant neoplasm of upper lobe, left bronchus or lung: Secondary | ICD-10-CM | POA: Diagnosis present

## 2020-03-01 DIAGNOSIS — E663 Overweight: Secondary | ICD-10-CM | POA: Diagnosis not present

## 2020-03-01 DIAGNOSIS — Z7901 Long term (current) use of anticoagulants: Secondary | ICD-10-CM | POA: Diagnosis not present

## 2020-03-01 DIAGNOSIS — R5383 Other fatigue: Secondary | ICD-10-CM | POA: Insufficient documentation

## 2020-03-01 DIAGNOSIS — Z7984 Long term (current) use of oral hypoglycemic drugs: Secondary | ICD-10-CM | POA: Insufficient documentation

## 2020-03-01 DIAGNOSIS — Z8042 Family history of malignant neoplasm of prostate: Secondary | ICD-10-CM | POA: Diagnosis not present

## 2020-03-01 DIAGNOSIS — Z833 Family history of diabetes mellitus: Secondary | ICD-10-CM | POA: Diagnosis not present

## 2020-03-01 DIAGNOSIS — N183 Chronic kidney disease, stage 3 unspecified: Secondary | ICD-10-CM | POA: Insufficient documentation

## 2020-03-01 DIAGNOSIS — I82402 Acute embolism and thrombosis of unspecified deep veins of left lower extremity: Secondary | ICD-10-CM | POA: Insufficient documentation

## 2020-03-01 DIAGNOSIS — Z8673 Personal history of transient ischemic attack (TIA), and cerebral infarction without residual deficits: Secondary | ICD-10-CM | POA: Insufficient documentation

## 2020-03-01 DIAGNOSIS — C7951 Secondary malignant neoplasm of bone: Secondary | ICD-10-CM | POA: Insufficient documentation

## 2020-03-01 DIAGNOSIS — Z8249 Family history of ischemic heart disease and other diseases of the circulatory system: Secondary | ICD-10-CM | POA: Insufficient documentation

## 2020-03-01 DIAGNOSIS — Z79899 Other long term (current) drug therapy: Secondary | ICD-10-CM | POA: Insufficient documentation

## 2020-03-01 DIAGNOSIS — Z86711 Personal history of pulmonary embolism: Secondary | ICD-10-CM | POA: Diagnosis not present

## 2020-03-01 LAB — CBC WITH DIFFERENTIAL/PLATELET
Abs Immature Granulocytes: 0 10*3/uL (ref 0.00–0.07)
Basophils Absolute: 0 10*3/uL (ref 0.0–0.1)
Basophils Relative: 1 %
Eosinophils Absolute: 0.2 10*3/uL (ref 0.0–0.5)
Eosinophils Relative: 5 %
HCT: 39.6 % (ref 39.0–52.0)
Hemoglobin: 13.7 g/dL (ref 13.0–17.0)
Immature Granulocytes: 0 %
Lymphocytes Relative: 48 %
Lymphs Abs: 2.3 10*3/uL (ref 0.7–4.0)
MCH: 30.6 pg (ref 26.0–34.0)
MCHC: 34.6 g/dL (ref 30.0–36.0)
MCV: 88.6 fL (ref 80.0–100.0)
Monocytes Absolute: 0.7 10*3/uL (ref 0.1–1.0)
Monocytes Relative: 14 %
Neutro Abs: 1.6 10*3/uL — ABNORMAL LOW (ref 1.7–7.7)
Neutrophils Relative %: 32 %
Platelets: 183 10*3/uL (ref 150–400)
RBC: 4.47 MIL/uL (ref 4.22–5.81)
RDW: 16.6 % — ABNORMAL HIGH (ref 11.5–15.5)
WBC: 4.9 10*3/uL (ref 4.0–10.5)
nRBC: 0 % (ref 0.0–0.2)

## 2020-03-01 LAB — COMPREHENSIVE METABOLIC PANEL
ALT: 27 U/L (ref 0–44)
AST: 25 U/L (ref 15–41)
Albumin: 4.2 g/dL (ref 3.5–5.0)
Alkaline Phosphatase: 49 U/L (ref 38–126)
Anion gap: 10 (ref 5–15)
BUN: 22 mg/dL (ref 8–23)
CO2: 21 mmol/L — ABNORMAL LOW (ref 22–32)
Calcium: 8.9 mg/dL (ref 8.9–10.3)
Chloride: 107 mmol/L (ref 98–111)
Creatinine, Ser: 1.69 mg/dL — ABNORMAL HIGH (ref 0.61–1.24)
GFR calc Af Amer: 48 mL/min — ABNORMAL LOW (ref >60)
GFR calc non Af Amer: 41 mL/min — ABNORMAL LOW (ref >60)
Glucose, Bld: 118 mg/dL — ABNORMAL HIGH (ref 70–99)
Potassium: 4 mmol/L (ref 3.5–5.1)
Sodium: 138 mmol/L (ref 135–145)
Total Bilirubin: 0.9 mg/dL (ref 0.3–1.2)
Total Protein: 7.3 g/dL (ref 6.5–8.1)

## 2020-03-01 MED ORDER — GABAPENTIN 100 MG PO CAPS
100.0000 mg | ORAL_CAPSULE | Freq: Every day | ORAL | 30 refills | Status: DC
Start: 2020-03-01 — End: 2020-03-29

## 2020-03-01 NOTE — Progress Notes (Signed)
[facility] OFFICE PROGRESS NOTE  Patient Care Team: Sowles, Krichna, MD as PCP - General (Family Medicine) ,  R, MD as Medical Oncologist (Medical Oncology) Hedrick, Julie E, RPH (Pharmacist)  Cancer Staging No matching staging information was found for the patient.   Oncology History Overview Note  # OCT 2017- ADENO CA LUNG; STAGE IV [; LUL; bil supraclavicular LN; Left neck LN Bx]; ROS-1 MUTATED; s/p Carbo-alimta x1[oct 2017]  # NOV 1st 2017- XALKORI 250 mg BID; JAN 15th CT- PR;  # AUG 27th Chemo-RT to persistent LUL/mediastinal LN [s/p carbo-taxol- with RT; finished Sep 22nd 2018];   # July 14th 2020- multiple metastatic lesions of the brain [July 30 whole brain radiation finished April 21, 2019]; bone scan skull metastases /posterior left ninth rib metastases; April 04, 2019 stopped crizotinib;  #April 25, 2019-start Entrectinib 600 mg once a day; STopped in NOV 2020 [dizziness]; NOV 2020-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   # LLE DVT/bil PE/Multiple strokes [? On xarelto]-Lovenox; Jan mid 2018- xarelto [lovenox-insurance issues]  # MRI brain- multiple infarcts [2d echo/bubble study-NEG's/p Neurology eval]  ------------------------------------------------------------    # # s/p TURP [Sep 2017; Dr.Cope] DEC 26th CT- distended bladder;  # Elevated PSA-  FEB 2021- 12;  S/p Bx- Negative; follow with urology/Dr.Stoioff   # MOLECULAR TESTING- ROS-1 POSITIVE; ALK/EGFR-NEG; PDL-1 EXPRESSION- 90%** [HIGH]  # DEC 15th 2020- PALLIATIVE CARE -----------------------------------------------------------------    DIAGNOSIS: Adenocarcinoma the lung  ROS-1 +  STAGE:  IV    ;GOALS: Palliative  CURRENT/MOST RECENT  THERAPY- Avastin [C]   Primary cancer of left upper lobe of lung ([facility])  08/09/2019 -  Chemotherapy   The patient had bevacizumab-awwb (MVASI) 800 mg in sodium chloride 0.9 % 100 mL chemo infusion, 725 mg, Intravenous,  Once, 6 of 6 cycles Dose modification: 10 mg/kg (original dose 10 mg/kg, Cycle 3, Reason: Other (see comments), Comment: weight change) Administration: 800 mg (08/09/2019), 800 mg (08/22/2019), 700 mg (09/19/2019), 700 mg (10/04/2019), 700 mg (10/18/2019), 700 mg (11/01/2019)  for chemotherapy treatment.        INTERVAL HISTORY:  Melvin Willis 69 y.o.  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  Patient complains of worsening tingling and numbness in the feet especially mornings.  No falls.  States his blood sugars in the mornings are about 160s.  Chronic fatigue.  Review of Systems  Constitutional: Positive for malaise/fatigue. Negative for chills, diaphoresis, fever and weight loss.  HENT: Negative for nosebleeds and sore throat.   Eyes: Negative for double vision.  Respiratory: Negative for cough, hemoptysis, sputum production, shortness of breath and wheezing.   Cardiovascular: Negative for chest pain (Left upper chest wall pain chronic), palpitations and orthopnea.  Gastrointestinal: Negative for abdominal pain, blood in stool, constipation, diarrhea, heartburn and melena.  Genitourinary: Negative for dysuria.  Musculoskeletal: Positive for joint pain. Negative for back pain.  Skin: Negative.  Negative for itching and rash.  Neurological: Positive for tingling and sensory change. Negative for focal weakness and weakness.  Endo/Heme/Allergies: Does not bruise/bleed easily.  Psychiatric/Behavioral: Positive for memory loss. Negative for depression. The patient is not nervous/anxious and does not have insomnia.     PAST MEDICAL HISTORY :    Past Medical History:   Diagnosis Date  . Abnormal prostate specific antigen 08/08/2012  . Adiposity 04/16/2015  . Chronic kidney disease (CKD), stage III (moderate) 11/27/2016  . CVA (cerebral vascular accident) ([facility]) 06/17/2016  . Diabetes mellitus without complication ([facility])   . Diverticulosis of sigmoid colon 04/16/2015  . Dyslipidemia 03/18/2015  . Hemorrhoids, internal 04/16/2015  . Hypercholesteremia 04/16/2015  . Hyperlipidemia   . Hypertension   . Primary cancer of left upper lobe of lung ([facility])   . Pulmonary embolism ([facility])   . Wears dentures    partial upper    PAST SURGICAL HISTORY :   Past Surgical History:  Procedure Laterality Date  . COLONOSCOPY    . COLONOSCOPY WITH PROPOFOL N/A 05/06/2015   Procedure: COLONOSCOPY WITH PROPOFOL;  Surgeon: Darren Wohl, MD;  Location: [facility];  Service: Endoscopy;  Laterality: N/A;  ASCENDING COLON POLYPS X 2 TERMINAL ILEUM BIOPSY RANDOM COLON BX. TRANSVERSE COLON POLYP SIGMOID COLON POLYP  . ESOPHAGOGASTRODUODENOSCOPY (EGD) WITH PROPOFOL N/A 05/06/2015   Procedure: ESOPHAGOGASTRODUODENOSCOPY (EGD) WITH PROPOFOL;  Surgeon: Darren Wohl, MD;  Location: [facility];  Service: Endoscopy;  Laterality: N/A;  GASTRIC BIOPSY X1  . KIDNEY STONE SURGERY  2017    FAMILY HISTORY :   Family History  Problem Relation Age of Onset  . Diabetes Mother   . Diabetes Father   . CAD Father   . Dementia Father   . Diabetes Sister   . Cancer Maternal Uncle        Prostate  . Cancer Cousin        prostate    SOCIAL HISTORY:   Social History   Tobacco Use  . Smoking status: Never Smoker  . Smokeless tobacco: Never Used  Vaping Use  . Vaping Use: Never used  Substance Use Topics  . Alcohol use: No    Alcohol/week: 0.0 standard drinks  . Drug use: No    ALLERGIES:  has No Known Allergies.  MEDICATIONS:  Current Outpatient Medications  Medication Sig Dispense Refill  . atorvastatin (LIPITOR) 40 MG tablet Take 1 tablet (40 mg total) by mouth 3  (three) times a week. 48 tablet 1  . blood glucose meter kit and supplies Dispense based on patient and insurance preference (Accuchek Aviva Plus, Accuchek Nano). Use up to four times daily as directed. (FOR ICD-10 E10.9, E11.9). 1 each 0  . entrectinib (ROZLYTREK) 200 MG capsule Take 3 capsules (600 mg total) by mouth daily. 90 capsule 6  . metFORMIN (GLUCOPHAGE) 500 MG tablet TAKE 1 TABLET BY MOUTH TWICE DAILY WITH A MEAL 180 tablet 0  . tamsulosin (FLOMAX) 0.4 MG CAPS capsule Take 1 capsule by mouth once daily in the morning 90 capsule 3  . XARELTO 20 MG TABS tablet TAKE 1 TABLET BY MOUTH ONCE DAILY WITH SUPPER 30 tablet 6  . gabapentin (NEURONTIN) 100 MG capsule Take 1 capsule (100 mg total) by mouth at bedtime. 30 capsule 30  . ondansetron (ZOFRAN) 8 MG tablet One pill every 8 hours as needed for nausea/vomitting. (Patient not taking: Reported on 01/03/2020) 40 tablet 1   No current facility-administered medications for this visit.    PHYSICAL EXAMINATION: ECOG PERFORMANCE STATUS: 0 - Asymptomatic  BP (!) 149/80 (BP Location: Left Arm, Patient Position: Sitting)   Pulse 72   Temp 98 F (36.7 C) (Tympanic)   Wt 163 lb 3.2 oz (74 kg)   SpO2 98%   BMI 28.01 kg/m   Filed Weights     03/01/20 1004  Weight: 163 lb 3.2 oz (74 kg)    Physical Exam Constitutional:      Comments: Patient is accompanied by his wife.  He is walking by himself.  HENT:     Head: Normocephalic and atraumatic.     Mouth/Throat:     Pharynx: No oropharyngeal exudate.  Eyes:     Pupils: Pupils are equal, round, and reactive to light.  Cardiovascular:     Rate and Rhythm: Normal rate and regular rhythm.  Pulmonary:     Effort: No respiratory distress.     Breath sounds: No wheezing.  Abdominal:     General: Bowel sounds are normal. There is no distension.     Palpations: Abdomen is soft. There is no mass.     Tenderness: There is no abdominal tenderness. There is no guarding or rebound.   Musculoskeletal:        General: No tenderness. Normal range of motion.     Cervical back: Normal range of motion and neck supple.  Skin:    General: Skin is warm.  Neurological:     Mental Status: He is alert and oriented to person, place, and time.  Psychiatric:        Mood and Affect: Affect normal.    LABORATORY DATA:  I have reviewed the data as listed    Component Value Date/Time   NA 138 03/01/2020 0950   NA 139 01/01/2016 1000   K 4.0 03/01/2020 0950   CL 107 03/01/2020 0950   CO2 21 (L) 03/01/2020 0950   GLUCOSE 118 (H) 03/01/2020 0950   BUN 22 03/01/2020 0950   BUN 13 01/01/2016 1000   CREATININE 1.69 (H) 03/01/2020 0950   CREATININE 1.32 (H) 08/22/2018 1107   CALCIUM 8.9 03/01/2020 0950   PROT 7.3 03/01/2020 0950   PROT 7.2 01/01/2016 1000   ALBUMIN 4.2 03/01/2020 0950   ALBUMIN 4.4 01/01/2016 1000   AST 25 03/01/2020 0950   ALT 27 03/01/2020 0950   ALKPHOS 49 03/01/2020 0950   BILITOT 0.9 03/01/2020 0950   BILITOT 0.4 01/01/2016 1000   GFRNONAA 41 (L) 03/01/2020 0950   GFRNONAA 56 (L) 08/22/2018 1107   GFRAA 48 (L) 03/01/2020 0950   GFRAA 65 08/22/2018 1107    No results found for: SPEP, UPEP  Lab Results  Component Value Date   WBC 4.9 03/01/2020   NEUTROABS 1.6 (L) 03/01/2020   HGB 13.7 03/01/2020   HCT 39.6 03/01/2020   MCV 88.6 03/01/2020   PLT 183 03/01/2020      Chemistry      Component Value Date/Time   NA 138 03/01/2020 0950   NA 139 01/01/2016 1000   K 4.0 03/01/2020 0950   CL 107 03/01/2020 0950   CO2 21 (L) 03/01/2020 0950   BUN 22 03/01/2020 0950   BUN 13 01/01/2016 1000   CREATININE 1.69 (H) 03/01/2020 0950   CREATININE 1.32 (H) 08/22/2018 1107      Component Value Date/Time   CALCIUM 8.9 03/01/2020 0950   ALKPHOS 49 03/01/2020 0950   AST 25 03/01/2020 0950   ALT 27 03/01/2020 0950   BILITOT 0.9 03/01/2020 0950   BILITOT 0.4 01/01/2016 1000       RADIOGRAPHIC STUDIES: I have personally reviewed the radiological  images as listed and agreed with the findings in the report. No results found.   ASSESSMENT & PLAN:  Primary cancer of left upper lobe of lung ([facility]) # Adenocarcinoma of the lung metastatic/stage   IV-ROS-1 positive: January 01, 2020 CT chest left upper lobe treatment changes; S/p AVASTIN x June 2021 -improved MRI brain lesions.  STABLE.   # Continue 2 pills Rozyltek [creatinine 1.6]; continue 2 pills a day  # Bilateral PE & left lower extremity DVT:  on Xarelto- STABLE  #CKD stage III-creatinine 1.7 [GFR~40] ; DM-monitor closely; STABLE.check Urine protein creatinine.   # Peripheral Neuropathy- ? DM; start gabapentin 100 mg qhs; will ramp up if tolerating well.   # DISPOSITION: # follow up in 1month-MD;labs- cbc/cmp;HbA1c; random urine protein creatinine--Dr.B      Orders Placed This Encounter  Procedures  . Hemoglobin A1c    Standing Status:   Future    Standing Expiration Date:   03/01/2021  . Protein / creatinine ratio, urine    Standing Status:   Future    Standing Expiration Date:   03/01/2021   All questions were answered. The patient knows to call the clinic with any problems, questions or concerns.       R , MD 03/01/2020 11:38 AM  

## 2020-03-01 NOTE — Assessment & Plan Note (Addendum)
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  STABLE.   # Continue 2 pills Rozyltek [creatinine 1.6]; continue 2 pills a day  # Bilateral PE & left lower extremity DVT:  on Xarelto- STABLE  #CKD stage III-creatinine 1.7 [GFR~40] ; DM-monitor closely; STABLE.check Urine protein creatinine.   # Peripheral Neuropathy- ? DM; start gabapentin 100 mg qhs; will ramp up if tolerating well.   # DISPOSITION: # follow up in 48monthMD;labs- cbc/cmp;HbA1c; random urine protein creatinine--Dr.B

## 2020-03-05 ENCOUNTER — Inpatient Hospital Stay (HOSPITAL_BASED_OUTPATIENT_CLINIC_OR_DEPARTMENT_OTHER): Payer: Medicare Other | Admitting: Hospice and Palliative Medicine

## 2020-03-05 DIAGNOSIS — Z515 Encounter for palliative care: Secondary | ICD-10-CM

## 2020-03-05 DIAGNOSIS — C3412 Malignant neoplasm of upper lobe, left bronchus or lung: Secondary | ICD-10-CM

## 2020-03-05 NOTE — Progress Notes (Signed)
Virtual Visit via Telephone Note  I connected with Melvin Willis on 03/05/20 at 11:30 AM EDT by telephone and verified that I am speaking with the correct person using two identifiers.   I discussed the limitations, risks, security and privacy concerns of performing an evaluation and management service by telephone and the availability of in person appointments. I also discussed with the patient that there may be a patient responsible charge related to this service. The patient expressed understanding and agreed to proceed.   History of Present Illness: Melvin Willis is a 69 y.o. 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  Patient is status post whole brain radiation and is currently on treatment with Avastin.  MRI of the brain on 07/25/2019 revealed progression of enhancing lesion in the left temporal lobe concerning for either radiation necrosis or progressive metastatic disease.  Patient was referred to palliative care to help address goals and manage ongoing symptoms.   Observations/Objective: Virtual visit was attempted but patient did not respond to messaging.  I called him and spoke to him by phone.  Patient says he is doing well.  He denies any acute changes or concerns.  He says his only symptomatic issue is lower extremity numbness/tingling.  Patient was started recently on gabapentin, which he takes at bedtime for peripheral neuropathy.  He reports that he is only taking it 2 days but thinks it is helping.  He denies any adverse effects from the gabapentin.  We discussed possible future increasing in frequency to twice a day or 3 times daily dosing if needed.  Assessment and Plan: Stage IV lung cancer -currently on treatment with Avastin.  Followed by Dr. Rogue Bussing.  Seems to be tolerating treatments.  Will follow  Peripheral neuropathy -on gabapentin.  Could consider increasing frequency to twice daily or 3 times  daily if needed  ACP -patient discussing decision making with his family  Follow Up Instructions: Follow-up MyChart in 2 months   I discussed the assessment and treatment plan with the patient. The patient was provided an opportunity to ask questions and all were answered. The patient agreed with the plan and demonstrated an understanding of the instructions.   The patient was advised to call back or seek an in-person evaluation if the symptoms worsen or if the condition fails to improve as anticipated.  I provided 5 minutes of non-face-to-face time during this encounter.   Irean Hong, NP

## 2020-03-29 ENCOUNTER — Other Ambulatory Visit: Payer: Self-pay

## 2020-03-29 ENCOUNTER — Inpatient Hospital Stay (HOSPITAL_BASED_OUTPATIENT_CLINIC_OR_DEPARTMENT_OTHER): Payer: Medicare Other | Admitting: Internal Medicine

## 2020-03-29 ENCOUNTER — Encounter: Payer: Self-pay | Admitting: Internal Medicine

## 2020-03-29 ENCOUNTER — Inpatient Hospital Stay: Payer: Medicare Other | Attending: Internal Medicine

## 2020-03-29 DIAGNOSIS — R5383 Other fatigue: Secondary | ICD-10-CM | POA: Diagnosis not present

## 2020-03-29 DIAGNOSIS — Z86718 Personal history of other venous thrombosis and embolism: Secondary | ICD-10-CM | POA: Insufficient documentation

## 2020-03-29 DIAGNOSIS — C7931 Secondary malignant neoplasm of brain: Secondary | ICD-10-CM | POA: Insufficient documentation

## 2020-03-29 DIAGNOSIS — C3412 Malignant neoplasm of upper lobe, left bronchus or lung: Secondary | ICD-10-CM | POA: Diagnosis not present

## 2020-03-29 DIAGNOSIS — Z8673 Personal history of transient ischemic attack (TIA), and cerebral infarction without residual deficits: Secondary | ICD-10-CM | POA: Insufficient documentation

## 2020-03-29 DIAGNOSIS — G629 Polyneuropathy, unspecified: Secondary | ICD-10-CM | POA: Insufficient documentation

## 2020-03-29 DIAGNOSIS — H538 Other visual disturbances: Secondary | ICD-10-CM | POA: Insufficient documentation

## 2020-03-29 DIAGNOSIS — Z833 Family history of diabetes mellitus: Secondary | ICD-10-CM | POA: Diagnosis not present

## 2020-03-29 DIAGNOSIS — Z79899 Other long term (current) drug therapy: Secondary | ICD-10-CM | POA: Insufficient documentation

## 2020-03-29 DIAGNOSIS — Z6828 Body mass index (BMI) 28.0-28.9, adult: Secondary | ICD-10-CM | POA: Insufficient documentation

## 2020-03-29 DIAGNOSIS — Z86010 Personal history of colonic polyps: Secondary | ICD-10-CM | POA: Diagnosis not present

## 2020-03-29 DIAGNOSIS — E0822 Diabetes mellitus due to underlying condition with diabetic chronic kidney disease: Secondary | ICD-10-CM

## 2020-03-29 DIAGNOSIS — C7951 Secondary malignant neoplasm of bone: Secondary | ICD-10-CM | POA: Diagnosis not present

## 2020-03-29 DIAGNOSIS — N183 Chronic kidney disease, stage 3 unspecified: Secondary | ICD-10-CM | POA: Diagnosis not present

## 2020-03-29 DIAGNOSIS — R200 Anesthesia of skin: Secondary | ICD-10-CM | POA: Insufficient documentation

## 2020-03-29 DIAGNOSIS — Z8042 Family history of malignant neoplasm of prostate: Secondary | ICD-10-CM | POA: Insufficient documentation

## 2020-03-29 DIAGNOSIS — E663 Overweight: Secondary | ICD-10-CM

## 2020-03-29 DIAGNOSIS — Z8249 Family history of ischemic heart disease and other diseases of the circulatory system: Secondary | ICD-10-CM | POA: Insufficient documentation

## 2020-03-29 DIAGNOSIS — Z8719 Personal history of other diseases of the digestive system: Secondary | ICD-10-CM | POA: Diagnosis not present

## 2020-03-29 DIAGNOSIS — E1122 Type 2 diabetes mellitus with diabetic chronic kidney disease: Secondary | ICD-10-CM | POA: Insufficient documentation

## 2020-03-29 DIAGNOSIS — Z818 Family history of other mental and behavioral disorders: Secondary | ICD-10-CM | POA: Insufficient documentation

## 2020-03-29 DIAGNOSIS — R519 Headache, unspecified: Secondary | ICD-10-CM | POA: Diagnosis not present

## 2020-03-29 DIAGNOSIS — R202 Paresthesia of skin: Secondary | ICD-10-CM | POA: Diagnosis not present

## 2020-03-29 DIAGNOSIS — Z7901 Long term (current) use of anticoagulants: Secondary | ICD-10-CM | POA: Diagnosis not present

## 2020-03-29 DIAGNOSIS — M2550 Pain in unspecified joint: Secondary | ICD-10-CM | POA: Diagnosis not present

## 2020-03-29 DIAGNOSIS — Z86711 Personal history of pulmonary embolism: Secondary | ICD-10-CM | POA: Diagnosis not present

## 2020-03-29 LAB — CBC WITH DIFFERENTIAL/PLATELET
Abs Immature Granulocytes: 0 10*3/uL (ref 0.00–0.07)
Basophils Absolute: 0 10*3/uL (ref 0.0–0.1)
Basophils Relative: 1 %
Eosinophils Absolute: 0.2 10*3/uL (ref 0.0–0.5)
Eosinophils Relative: 3 %
HCT: 39.9 % (ref 39.0–52.0)
Hemoglobin: 13.9 g/dL (ref 13.0–17.0)
Immature Granulocytes: 0 %
Lymphocytes Relative: 45 %
Lymphs Abs: 2.1 10*3/uL (ref 0.7–4.0)
MCH: 31.2 pg (ref 26.0–34.0)
MCHC: 34.8 g/dL (ref 30.0–36.0)
MCV: 89.5 fL (ref 80.0–100.0)
Monocytes Absolute: 0.7 10*3/uL (ref 0.1–1.0)
Monocytes Relative: 14 %
Neutro Abs: 1.7 10*3/uL (ref 1.7–7.7)
Neutrophils Relative %: 37 %
Platelets: 176 10*3/uL (ref 150–400)
RBC: 4.46 MIL/uL (ref 4.22–5.81)
RDW: 14.6 % (ref 11.5–15.5)
WBC: 4.7 10*3/uL (ref 4.0–10.5)
nRBC: 0 % (ref 0.0–0.2)

## 2020-03-29 LAB — PROTEIN / CREATININE RATIO, URINE
Creatinine, Urine: 156 mg/dL
Protein Creatinine Ratio: 0.08 mg/mg{Cre} (ref 0.00–0.15)
Total Protein, Urine: 12 mg/dL

## 2020-03-29 LAB — COMPREHENSIVE METABOLIC PANEL
ALT: 28 U/L (ref 0–44)
AST: 22 U/L (ref 15–41)
Albumin: 4.2 g/dL (ref 3.5–5.0)
Alkaline Phosphatase: 46 U/L (ref 38–126)
Anion gap: 8 (ref 5–15)
BUN: 18 mg/dL (ref 8–23)
CO2: 23 mmol/L (ref 22–32)
Calcium: 9.1 mg/dL (ref 8.9–10.3)
Chloride: 108 mmol/L (ref 98–111)
Creatinine, Ser: 1.57 mg/dL — ABNORMAL HIGH (ref 0.61–1.24)
GFR calc Af Amer: 52 mL/min — ABNORMAL LOW (ref >60)
GFR calc non Af Amer: 45 mL/min — ABNORMAL LOW (ref >60)
Glucose, Bld: 133 mg/dL — ABNORMAL HIGH (ref 70–99)
Potassium: 4.2 mmol/L (ref 3.5–5.1)
Sodium: 139 mmol/L (ref 135–145)
Total Bilirubin: 0.8 mg/dL (ref 0.3–1.2)
Total Protein: 7.3 g/dL (ref 6.5–8.1)

## 2020-03-29 MED ORDER — GABAPENTIN 300 MG PO CAPS
300.0000 mg | ORAL_CAPSULE | Freq: Every day | ORAL | 3 refills | Status: DC
Start: 2020-03-29 — End: 2020-06-13

## 2020-03-29 NOTE — Progress Notes (Signed)
[place] OFFICE PROGRESS NOTE  Patient Care Team: [name], [name], MD as Medical Oncologist (Medical Oncology) Cathi Roan, [facility] (Pharmacist)  Cancer Staging No matching staging information was found for the patient.   Oncology History Overview Note  # OCT 2017- ADENO CA LUNG; STAGE IV [; LUL; bil supraclavicular LN; Left neck LN Bx]; ROS-1 MUTATED; s/p Carbo-alimta x1[oct 2017]  # NOV 1st 2017- XALKORI 250 mg BID; JAN 15th CT- PR;  # AUG 27th Chemo-RT to persistent LUL/mediastinal LN [s/p carbo-taxol- with RT; finished Sep 22nd 2018];   # July 14th 2020- multiple metastatic lesions of the brain [July 30 whole brain radiation finished April 21, 2019]; bone scan skull metastases /posterior left ninth rib metastases; April 04, 2019 stopped crizotinib;  #April 25, 2019-start Entrectinib 600 mg once a day; STopped in NOV 2020 [dizziness]; NOV 2020-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   # LLE DVT/bil PE/Multiple strokes [? On xarelto]-Lovenox; Jan mid 2018- xarelto [lovenox-insurance issues]  # MRI brain- multiple infarcts [2d echo/bubble study-NEG's/p Neurology eval]  ------------------------------------------------------------    # # s/p TURP [Sep 2017; Dr.Cope] DEC 26th CT- distended bladder;  # Elevated PSA-  FEB 2021- 12;  S/p Bx- Negative; follow with urology/Dr.Stoioff   # MOLECULAR TESTING- ROS-1 POSITIVE; ALK/EGFR-NEG; PDL-1 EXPRESSION- 90%** [HIGH]  # DEC 15th 2020- PALLIATIVE CARE -----------------------------------------------------------------    DIAGNOSIS: Adenocarcinoma the lung  ROS-1 +  STAGE:  IV    ;GOALS: Palliative  CURRENT/MOST RECENT  THERAPY- Avastin [C]   Primary cancer of left upper lobe of lung ([place])  08/09/2019 -  Chemotherapy   The patient had bevacizumab-awwb (MVASI) 800 mg in sodium chloride 0.9 % 100 mL chemo infusion, 725 mg, Intravenous,  Once, 6 of 6 cycles Dose modification: 10 mg/kg (original dose 10 mg/kg, Cycle 3, Reason: Other (see comments), Comment: weight change) Administration: 800 mg (08/09/2019), 800 mg (08/22/2019), 700 mg (09/19/2019), 700 mg (10/04/2019), 700 mg (10/18/2019), 700 mg (11/01/2019)  for chemotherapy treatment.        INTERVAL HISTORY:  Melvin Willis 69 y.o.  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  Patient complains of worsening tingling numbness in the extremities.  He is currently on Neurontin milligrams at night.  No nausea no vomiting.  Headaches but no worsening dizziness.  Complains of blurry vision.  Awaiting ophthalmology evaluation.   Review of Systems  Constitutional: Positive for malaise/fatigue. Negative for chills, diaphoresis, fever and weight loss.  HENT: Negative for nosebleeds and sore throat.   Eyes: Negative for double vision.  Respiratory: Negative for cough, hemoptysis, sputum production, shortness of breath and wheezing.   Cardiovascular: Negative for chest pain (Left upper chest wall pain chronic), palpitations and orthopnea.  Gastrointestinal: Negative for abdominal pain, blood in stool, constipation, diarrhea, heartburn and melena.  Genitourinary: Negative for dysuria.  Musculoskeletal: Positive for joint pain. Negative for back pain.  Skin: Negative.  Negative for itching and rash.  Neurological: Positive for tingling and sensory change. Negative for focal weakness and weakness.  Endo/Heme/Allergies: Does not bruise/bleed easily.  Psychiatric/Behavioral: Positive for memory loss. Negative for depression. The patient is not nervous/anxious and does not  have insomnia.     PAST MEDICAL HISTORY :  Past Medical History:  Diagnosis Date   Abnormal prostate specific antigen 08/08/2012   Adiposity 04/16/2015   Chronic kidney disease (CKD), stage III (moderate) 11/27/2016   CVA (cerebral vascular accident) ([place]) 06/17/2016   Diabetes mellitus without complication ([place])    Diverticulosis of sigmoid colon 04/16/2015   Dyslipidemia 03/18/2015   Hemorrhoids, internal 04/16/2015   Hypercholesteremia 04/16/2015   Hyperlipidemia    Hypertension    Primary cancer of left upper lobe of lung ([place])    Pulmonary embolism ([place])    Wears dentures    partial upper    PAST SURGICAL HISTORY :   Past Surgical History:  Procedure Laterality Date   COLONOSCOPY     COLONOSCOPY WITH PROPOFOL N/A 05/06/2015   Procedure: COLONOSCOPY WITH PROPOFOL;  Surgeon: Lucilla Lame, MD;  Location: [facility];  Service: Endoscopy;  Laterality: N/A;  ASCENDING COLON POLYPS X 2 TERMINAL ILEUM BIOPSY RANDOM COLON BX. TRANSVERSE COLON POLYP SIGMOID COLON POLYP   ESOPHAGOGASTRODUODENOSCOPY (EGD) WITH PROPOFOL N/A 05/06/2015   Procedure: ESOPHAGOGASTRODUODENOSCOPY (EGD) WITH PROPOFOL;  Surgeon: Lucilla Lame, MD;  Location: [facility];  Service: Endoscopy;  Laterality: N/A;  GASTRIC BIOPSY X1   KIDNEY STONE SURGERY  2017    FAMILY HISTORY :   Family History  Problem Relation Age of Onset   Diabetes Mother    Diabetes Father    CAD Father    Dementia Father    Diabetes Sister    Cancer Maternal Uncle        Prostate   Cancer Cousin        prostate    SOCIAL HISTORY:   Social History   Tobacco Use   Smoking status: Never Smoker   Smokeless tobacco: Never Used  Scientific laboratory technician Use: Never used  Substance Use Topics   Alcohol use: No    Alcohol/week: 0.0 standard drinks   Drug use: No    ALLERGIES:  has No Known Allergies.  MEDICATIONS:  Current Outpatient Medications  Medication Sig Dispense Refill    atorvastatin (LIPITOR) 40 MG tablet Take 1 tablet (40 mg total) by mouth 3 (three) times a week. 48 tablet 1   blood glucose meter kit and supplies Dispense based on patient and insurance preference (Accuchek Aviva Plus, Accuchek Nano). Use up to four times daily as directed. (FOR ICD-10 E10.9, E11.9). 1 each 0   entrectinib (ROZLYTREK) 200 MG capsule Take 3 capsules (600 mg total) by mouth daily. 90 capsule 6   gabapentin (NEURONTIN) 300 MG capsule Take 1 capsule (300 mg total) by mouth at bedtime. 30 capsule 3   metFORMIN (GLUCOPHAGE) 500 MG tablet TAKE 1 TABLET BY MOUTH TWICE DAILY WITH A MEAL 180 tablet 0   tamsulosin (FLOMAX) 0.4 MG CAPS capsule Take 1 capsule by mouth once daily in the morning 90 capsule 3   XARELTO 20 MG TABS tablet TAKE 1 TABLET BY MOUTH ONCE DAILY WITH SUPPER 30 tablet 6   ondansetron (ZOFRAN) 8 MG tablet One pill every 8 hours as needed for nausea/vomitting. (Patient not taking: Reported on 01/03/2020) 40 tablet 1   No current facility-administered medications for this visit.    PHYSICAL EXAMINATION: ECOG PERFORMANCE STATUS: 0 - Asymptomatic  BP (!) 130/83 (BP Location: Left Arm, Patient Position: Sitting, Cuff Size: Large)    Pulse 71    Temp (!) 97.3 F (36.3 C) (Tympanic)    Resp 16  Ht '5\' 4"'  (1.626 m)    Wt 165 lb (74.8 kg)    SpO2 98%    BMI 28.32 kg/m   Filed Weights   03/29/20 0958  Weight: 165 lb (74.8 kg)    Physical Exam Constitutional:      Comments: Patient is accompanied by his wife.  He is walking by himself.  HENT:     Head: Normocephalic and atraumatic.     Mouth/Throat:     Pharynx: No oropharyngeal exudate.  Eyes:     Pupils: Pupils are equal, round, and reactive to light.  Cardiovascular:     Rate and Rhythm: Normal rate and regular rhythm.  Pulmonary:     Effort: No respiratory distress.     Breath sounds: No wheezing.  Abdominal:     General: Bowel sounds are normal. There is no distension.     Palpations: Abdomen is  soft. There is no mass.     Tenderness: There is no abdominal tenderness. There is no guarding or rebound.  Musculoskeletal:        General: No tenderness. Normal range of motion.     Cervical back: Normal range of motion and neck supple.  Skin:    General: Skin is warm.  Neurological:     Mental Status: He is alert and oriented to person, place, and time.  Psychiatric:        Mood and Affect: Affect normal.    LABORATORY DATA:  I have reviewed the data as listed    Component Value Date/Time   NA 139 03/29/2020 0934   NA 139 01/01/2016 1000   K 4.2 03/29/2020 0934   CL 108 03/29/2020 0934   CO2 23 03/29/2020 0934   GLUCOSE 133 (H) 03/29/2020 0934   BUN 18 03/29/2020 0934   BUN 13 01/01/2016 1000   CREATININE 1.57 (H) 03/29/2020 0934   CREATININE 1.32 (H) 08/22/2018 1107   CALCIUM 9.1 03/29/2020 0934   PROT 7.3 03/29/2020 0934   PROT 7.2 01/01/2016 1000   ALBUMIN 4.2 03/29/2020 0934   ALBUMIN 4.4 01/01/2016 1000   AST 22 03/29/2020 0934   ALT 28 03/29/2020 0934   ALKPHOS 46 03/29/2020 0934   BILITOT 0.8 03/29/2020 0934   BILITOT 0.4 01/01/2016 1000   GFRNONAA 45 (L) 03/29/2020 0934   GFRNONAA 56 (L) 08/22/2018 1107   GFRAA 52 (L) 03/29/2020 0934   GFRAA 65 08/22/2018 1107    No results found for: SPEP, UPEP  Lab Results  Component Value Date   WBC 4.7 03/29/2020   NEUTROABS 1.7 03/29/2020   HGB 13.9 03/29/2020   HCT 39.9 03/29/2020   MCV 89.5 03/29/2020   PLT 176 03/29/2020      Chemistry      Component Value Date/Time   NA 139 03/29/2020 0934   NA 139 01/01/2016 1000   K 4.2 03/29/2020 0934   CL 108 03/29/2020 0934   CO2 23 03/29/2020 0934   BUN 18 03/29/2020 0934   BUN 13 01/01/2016 1000   CREATININE 1.57 (H) 03/29/2020 0934   CREATININE 1.32 (H) 08/22/2018 1107      Component Value Date/Time   CALCIUM 9.1 03/29/2020 0934   ALKPHOS 46 03/29/2020 0934   AST 22 03/29/2020 0934   ALT 28 03/29/2020 0934   BILITOT 0.8 03/29/2020 0934   BILITOT 0.4  01/01/2016 1000       RADIOGRAPHIC STUDIES: I have personally reviewed the radiological images as listed and agreed with the findings in the  report. No results found.   ASSESSMENT & PLAN:  Primary cancer of left upper lobe of lung [facility]) # Adenocarcinoma of the lung metastatic/stage IV-ROS-1 positive: January 01, 2020 CT chest left upper lobe treatment changes; S/p AVASTIN x6;  June 2021 -improved MRI brain lesions.  STABLE.   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   #CKD stage III-creatinine 1.7 [GFR~52]-STABLE  #  DM-monitor-STABLE; BG-133; await A1c;  Urine protein creatinine-pending.    # Peripheral Neuropathy- ? DM; increase the gabapentin 200 mg qhs.  # Blurry vision- Not common side effect of Entrectinib; agree with opthamology evaluation   # DISPOSITION: # follow up in 5 weeks MD;labs- cbc/cmp; CT chest prior---Dr.B      Orders Placed This Encounter  Procedures   CT CHEST WO CONTRAST    Standing Status:   Future    Standing Expiration Date:   03/29/2021    Order Specific Question:   Preferred imaging location?    Answer:   [facility]    Order Specific Question:   Radiology Contrast Protocol - do NOT remove file path    Answer:   [email or web address]   Comprehensive metabolic panel    Standing Status:   Future    Standing Expiration Date:   03/29/2021   CBC with Differential    Standing Status:   Future    Standing Expiration Date:   03/29/2021   All questions were answered. The patient knows to call the clinic with any problems, questions or concerns.      Cammie Sickle, MD 03/31/2020 5:43 PM

## 2020-03-29 NOTE — Assessment & Plan Note (Addendum)
#  Adenocarcinoma of the lung metastatic/stage IV-ROS-1 positive: January 01, 2020 CT chest left upper lobe treatment changes; S/p AVASTIN x6;  June 2021 -improved MRI brain lesions.  STABLE.   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   #CKD stage III-creatinine 1.7 [GFR~52]-STABLE  #  DM-monitor-STABLE; BG-133; await A1c;  Urine protein creatinine-pending.    # Peripheral Neuropathy- ? DM; increase the gabapentin 200 mg qhs.  # Blurry vision- Not common side effect of Entrectinib; agree with opthamology evaluation   # DISPOSITION: # follow up in 5 weeks MD;labs- cbc/cmp; CT chest prior---Dr.B

## 2020-04-03 LAB — HEMOGLOBIN A1C
Hgb A1c MFr Bld: 7.1 % — ABNORMAL HIGH (ref 4.8–5.6)
Mean Plasma Glucose: 157.07 mg/dL

## 2020-04-18 ENCOUNTER — Ambulatory Visit (INDEPENDENT_AMBULATORY_CARE_PROVIDER_SITE_OTHER): Payer: Medicare Other

## 2020-04-18 DIAGNOSIS — Z0000 Encounter for general adult medical examination without abnormal findings: Secondary | ICD-10-CM | POA: Diagnosis not present

## 2020-04-18 NOTE — Patient Instructions (Signed)
[name] , Thank you for taking time to come for your Medicare Wellness Visit. I appreciate your ongoing commitment to your health goals. Please review the following plan we discussed and let me know if I can assist you in the future.   Screening recommendations/referrals: Colonoscopy: done 05/06/15 Recommended yearly ophthalmology/optometry visit for glaucoma screening and checkup Recommended yearly dental visit for hygiene and checkup  Vaccinations: Influenza vaccine: done 06/17/19 Pneumococcal vaccine: done 08/22/18 Tdap vaccine: done 01/18/12 Shingles vaccine: discuss with your provider regarding recommendations for this vaccine    Covid-19: done 10/28/19 & 11/19/19  Advanced directives: Please bring a copy of your health care power of attorney and living will to the office at your convenience.  Conditions/risks identified: Recommend drinking 6-8 glasses of water per day  Next appointment: Follow up in one year for your annual wellness visit.   Preventive Care 67 Years and Older, Male Preventive care refers to lifestyle choices and visits with your health care provider that can promote health and wellness. What does preventive care include?  A yearly physical exam. This is also called an annual well check.  Dental exams once or twice a year.  Routine eye exams. Ask your health care provider how often you should have your eyes checked.  Personal lifestyle choices, including:  Daily care of your teeth and gums.  Regular physical activity.  Eating a healthy diet.  Avoiding tobacco and drug use.  Limiting alcohol use.  Practicing safe sex.  Taking low doses of aspirin every day.  Taking vitamin and mineral supplements as recommended by your health care provider. What happens during an annual well check? The services and screenings done by your health care provider during your annual well check will depend on your age, overall health, lifestyle risk factors, and  family history of disease. Counseling  Your health care provider may ask you questions about your:  Alcohol use.  Tobacco use.  Drug use.  Emotional well-being.  Home and relationship well-being.  Sexual activity.  Eating habits.  History of falls.  Memory and ability to understand (cognition).  Work and work Statistician. Screening  You may have the following tests or measurements:  Height, weight, and BMI.  Blood pressure.  Lipid and cholesterol levels. These may be checked every 5 years, or more frequently if you are over 36 years old.  Skin check.  Lung cancer screening. You may have this screening every year starting at age 67 if you have a 30-pack-year history of smoking and currently smoke or have quit within the past 15 years.  Fecal occult blood test (FOBT) of the stool. You may have this test every year starting at age 67.  Flexible sigmoidoscopy or colonoscopy. You may have a sigmoidoscopy every 5 years or a colonoscopy every 10 years starting at age 67.  Prostate cancer screening. Recommendations will vary depending on your family history and other risks.  Hepatitis C blood test.  Hepatitis B blood test.  Sexually transmitted disease (STD) testing.  Diabetes screening. This is done by checking your blood sugar (glucose) after you have not eaten for a while (fasting). You may have this done every 1-3 years.  Abdominal aortic aneurysm (AAA) screening. You may need this if you are a current or former smoker.  Osteoporosis. You may be screened starting at age 67 if you are at high risk. Talk with your health care provider about your test results, treatment options, and if necessary, the need for more tests. Vaccines  Your health care provider may recommend certain vaccines, such as:  Influenza vaccine. This is recommended every year.  Tetanus, diphtheria, and acellular pertussis (Tdap, Td) vaccine. You may need a Td booster every 10 years.  Zoster  vaccine. You may need this after age 67.  Pneumococcal 13-valent conjugate (PCV13) vaccine. One dose is recommended after age 67.  Pneumococcal polysaccharide (PPSV23) vaccine. One dose is recommended after age 67. Talk to your health care provider about which screenings and vaccines you need and how often you need them. This information is not intended to replace advice given to you by your health care provider. Make sure you discuss any questions you have with your health care provider. Document Released: 09/20/2015 Document Revised: 05/13/2016 Document Reviewed: 06/25/2015 Elsevier Interactive Patient Education  2017 [place] Prevention in the Home Falls can cause injuries. They can happen to people of all ages. There are many things you can do to make your home safe and to help prevent falls. What can I do on the outside of my home?  Regularly fix the edges of walkways and driveways and fix any cracks.  Remove anything that might make you trip as you walk through a door, such as a raised step or threshold.  Trim any bushes or trees on the path to your home.  Use bright outdoor lighting.  Clear any walking paths of anything that might make someone trip, such as rocks or tools.  Regularly check to see if handrails are loose or broken. Make sure that both sides of any steps have handrails.  Any raised decks and porches should have guardrails on the edges.  Have any leaves, snow, or ice cleared regularly.  Use sand or salt on walking paths during winter.  Clean up any spills in your garage right away. This includes oil or grease spills. What can I do in the bathroom?  Use night lights.  Install grab bars by the toilet and in the tub and shower. Do not use towel bars as grab bars.  Use non-skid mats or decals in the tub or shower.  If you need to sit down in the shower, use a plastic, non-slip stool.  Keep the floor dry. Clean up any water that spills on the  floor as soon as it happens.  Remove soap buildup in the tub or shower regularly.  Attach bath mats securely with double-sided non-slip rug tape.  Do not have throw rugs and other things on the floor that can make you trip. What can I do in the bedroom?  Use night lights.  Make sure that you have a light by your bed that is easy to reach.  Do not use any sheets or blankets that are too big for your bed. They should not hang down onto the floor.  Have a firm chair that has side arms. You can use this for support while you get dressed.  Do not have throw rugs and other things on the floor that can make you trip. What can I do in the kitchen?  Clean up any spills right away.  Avoid walking on wet floors.  Keep items that you use a lot in easy-to-reach places.  If you need to reach something above you, use a strong step stool that has a grab bar.  Keep electrical cords out of the way.  Do not use floor polish or wax that makes floors slippery. If you must use wax, use non-skid floor wax.  Do  not have throw rugs and other things on the floor that can make you trip. What can I do with my stairs?  Do not leave any items on the stairs.  Make sure that there are handrails on both sides of the stairs and use them. Fix handrails that are broken or loose. Make sure that handrails are as long as the stairways.  Check any carpeting to make sure that it is firmly attached to the stairs. Fix any carpet that is loose or worn.  Avoid having throw rugs at the top or bottom of the stairs. If you do have throw rugs, attach them to the floor with carpet tape.  Make sure that you have a light switch at the top of the stairs and the bottom of the stairs. If you do not have them, ask someone to add them for you. What else can I do to help prevent falls?  Wear shoes that:  Do not have high heels.  Have rubber bottoms.  Are comfortable and fit you well.  Are closed at the toe. Do not wear  sandals.  If you use a stepladder:  Make sure that it is fully opened. Do not climb a closed stepladder.  Make sure that both sides of the stepladder are locked into place.  Ask someone to hold it for you, if possible.  Clearly mark and make sure that you can see:  Any grab bars or handrails.  First and last steps.  Where the edge of each step is.  Use tools that help you move around (mobility aids) if they are needed. These include:  Canes.  Walkers.  Scooters.  Crutches.  Turn on the lights when you go into a dark area. Replace any light bulbs as soon as they burn out.  Set up your furniture so you have a clear path. Avoid moving your furniture around.  If any of your floors are uneven, fix them.  If there are any pets around you, be aware of where they are.  Review your medicines with your doctor. Some medicines can make you feel dizzy. This can increase your chance of falling. Ask your doctor what other things that you can do to help prevent falls. This information is not intended to replace advice given to you by your health care provider. Make sure you discuss any questions you have with your health care provider. Document Released: 06/20/2009 Document Revised: 01/30/2016 Document Reviewed: 09/28/2014 Elsevier Interactive Patient Education  2017 [employer].

## 2020-04-18 NOTE — Progress Notes (Signed)
Subjective:   Melvin Willis is a 69 y.o. male who presents for Medicare Annual/Subsequent preventive examination.  Virtual Visit via Telephone Note  I connected with  [name] on 04/19/20 at  8:00 AM EDT by telephone and verified that I am speaking with the correct person using two identifiers.  Medicare Annual Wellness visit completed telephonically due to Covid-19 pandemic.   Location: Patient: home Provider: [facility]   I discussed the limitations, risks, security and privacy concerns of performing an evaluation and management service by telephone and the availability of in person appointments. The patient expressed understanding and agreed to proceed.  Unable to perform video visit due to video visit attempted and failed and/or patient does not have video capability.   Some vital signs may be absent or patient reported.   Clemetine Marker, LPN    Review of Systems     Cardiac Risk Factors include: advanced age (>51mn, >>76women);male gender;dyslipidemia;diabetes mellitus     Objective:    There were no vitals filed for this visit. There is no height or weight on file to calculate BMI.  Advanced Directives 04/18/2020 02/29/2020 01/31/2020 01/03/2020 12/06/2019 11/14/2019 10/03/2019  Does Patient Have a Medical Advance Directive? Yes No No No No No No  Type of AParamedicof [place] will - - - [place]Living will - -  Does patient want to make changes to medical advance directive? - - - - - No - Patient declined No - Patient declined  Copy of H[place] Chart? No - copy requested - - - No - copy requested - -  Would patient like information on creating a medical advance directive? - No - Patient declined - - No - Patient declined - -    Current Medications (verified) Outpatient Encounter Medications as of 04/18/2020  Medication Sig  . atorvastatin (LIPITOR) 40 MG tablet Take 1 tablet (40 mg  total) by mouth 3 (three) times a week.  . blood glucose meter kit and supplies Dispense based on patient and insurance preference (Accuchek Aviva Plus, Accuchek Nano). Use up to four times daily as directed. (FOR ICD-10 E10.9, E11.9).  .Marland Kitchenentrectinib (ROZLYTREK) 200 MG capsule Take 3 capsules (600 mg total) by mouth daily.  .Marland Kitchengabapentin (NEURONTIN) 300 MG capsule Take 1 capsule (300 mg total) by mouth at bedtime.  . metFORMIN (GLUCOPHAGE) 500 MG tablet TAKE 1 TABLET BY MOUTH TWICE DAILY WITH A MEAL  . tamsulosin (FLOMAX) 0.4 MG CAPS capsule Take 1 capsule by mouth once daily in the morning  . XARELTO 20 MG TABS tablet TAKE 1 TABLET BY MOUTH ONCE DAILY WITH SUPPER  . ondansetron (ZOFRAN) 8 MG tablet One pill every 8 hours as needed for nausea/vomitting. (Patient not taking: Reported on 01/03/2020)   No facility-administered encounter medications on file as of 04/18/2020.    Allergies (verified) Patient has no known allergies.   History: Past Medical History:  Diagnosis Date  . Abnormal prostate specific antigen 08/08/2012  . Adiposity 04/16/2015  . Chronic kidney disease (CKD), stage III (moderate) 11/27/2016  . CV[place]rebral [place] 06/17/[place]  . Diabetes mellitus without complication ([place]   . Diverticulosis of sigmoid colon 04/16/2015  . Dyslipidemia 03/18/2015  . Hemorrhoids, internal 04/16/2015  . Hypercholesteremia 04/16/2015  . Hyperlipidemia   . Hypertension   . Primary cancer of left upper lobe of lung ([place]   . Pulmonary embolism ([facility]   . Wears dentures  partial upper   Past Surgical History:  Procedure Laterality Date  . COLONOSCOPY    . COLONOSCOPY WITH PROPOFOL N/A 05/06/2015   Procedure: COLONOSCOPY WITH PROPOFOL;  Surgeon: Lucilla Lame, MD;  Location: [facility];  Service: Endoscopy;  Laterality: N/A;  ASCENDING COLON POLYPS X 2 TERMINAL ILEUM BIOPSY RANDOM COLON BX. TRANSVERSE COLON POLYP SIGMOID COLON POLYP  . ESOPHAGOGASTRODUODENOSCOPY (EGD)  WITH PROPOFOL N/A 05/06/2015   Procedure: ESOPHAGOGASTRODUODENOSCOPY (EGD) WITH PROPOFOL;  Surgeon: Lucilla Lame, MD;  Location: [facility];  Service: Endoscopy;  Laterality: N/A;  GASTRIC BIOPSY X1  . KIDNEY STONE SURGERY  2017   Family History  Problem Relation Age of Onset  . Diabetes Mother   . Diabetes Father   . CAD Father   . Dementia Father   . Diabetes Sister   . Cancer Maternal Uncle        Prostate  . Cancer Cousin        prostate   Social History   Socioeconomic History  . Marital status: Married    Spouse name: Melissa   . Number of children: 3  . Years of education: Not on file  . Highest education level: Not on file  Occupational History  . Occupation: disable     Comment: from CVA and lung cancer  Tobacco Use  . Smoking status: Never Smoker  . Smokeless tobacco: Never Used  Vaping Use  . Vaping Use: Never used  Substance and Sexual Activity  . Alcohol use: No    Alcohol/week: 0.0 standard drinks  . Drug use: No  . Sexual activity: Yes    Partners: Female  Other Topics Concern  . Not on file  Social History Narrative   Used to work until 2 years ago when got sick with DVT leg , PE , CVA and found out he had lung cancer. He has medicare now    Social Determinants of Radio broadcast assistant Strain: Low Risk   . Difficulty of Paying Living Expenses: Not very hard  Food Insecurity: No Food Insecurity  . Worried About Charity fundraiser in the Last Year: Never true  . Ran Out of Food in the Last Year: Never true  Transportation Needs: No Transportation Needs  . Lack of Transportation (Medical): No  . Lack of Transportation (Non-Medical): No  Physical Activity: Inactive  . Days of Exercise per Week: 0 days  . Minutes of Exercise per Session: 0 min  Stress: No Stress Concern Present  . Feeling of Stress : Not at all  Social Connections: Moderately Integrated  . Frequency of Communication with Friends and Family: More than three times a  week  . Frequency of Social Gatherings with Friends and Family: Three times a week  . Attends Religious Services: More than 4 times per year  . Active Member of Clubs or Organizations: No  . Attends Archivist Meetings: Never  . Marital Status: Married    Tobacco Counseling Counseling given: Not Answered   Clinical Intake:  Pre-visit preparation completed: Yes  Pain : No/denies pain     Nutritional Risks: Nausea/ vomitting/ diarrhea (diarrhea) Diabetes: Yes CBG done?: No Did pt. bring in CBG monitor from home?: No  How often do you need to have someone help you when you read instructions, pamphlets, or other written materials from your doctor or pharmacy?: 1 - Never  Nutrition Risk Assessment:  Has the patient had any N/V/D within the last 2 months?  Yes  Does the patient have any non-healing wounds?  No  Has the patient had any unintentional weight loss or weight gain?  No   Diabetes:  Is the patient diabetic?  Yes  If diabetic, was a CBG obtained today?  No  Did the patient bring in their glucometer from home?  No  How often do you monitor your CBG's? daily.   Financial Strains and Diabetes Management:  Are you having any financial strains with the device, your supplies or your medication? No .  Does the patient want to be seen by Chronic Care Management for management of their diabetes?  No  Would the patient like to be referred to a Nutritionist or for Diabetic Management?  No   Diabetic Exams:  Diabetic Eye Exam: Overdue for diabetic eye exam. Pt has been advised about the importance in completing this exam. Patient states he has an upcoming appt at [facility]   Diabetic Foot Exam: [place]. Pt has been advised about the importance in completing this exam.   Interpreter Needed?: No  Information entered by :: Clemetine Marker LPN   Activities of Daily Living In your present state of health, do you have any difficulty performing the following  activities: 04/18/2020 08/07/2019  Hearing? N N  Comment declines hearing aids -  Vision? N N  Difficulty concentrating or making decisions? N Y  Walking or climbing stairs? N N  Dressing or bathing? N N  Doing errands, shopping? N N  Preparing Food and eating ? N -  Using the Toilet? N -  In the past six months, have you accidently leaked urine? Y -  Do you have problems with loss of bowel control? Y -  Managing your Medications? N -  Managing your Finances? N -  Housekeeping or managing your Housekeeping? N -  Some recent data might be hidden    Patient Care Team: [name], [name], MD as Medical Oncologist (Medical Oncology) Cathi Roan, [facility] (Pharmacist)  Indicate any recent Medical Services you may have received from other than [facility] providers in the past year (date may be approximate).     Assessment:   This is a routine wellness examination for [place].  Hearing/Vision screen  Hearing Screening   '125Hz'  '250Hz'  '500Hz'  '1000Hz'  '2000Hz'  '3000Hz'  '4000Hz'  '6000Hz'  '8000Hz'   Right ear:           Left ear:           Comments: Pt denies hearing difficulty  Vision Screening Comments: Annual eye exam scheduled with [facility]  Dietary issues and exercise activities discussed: Current Exercise Habits: The patient does not participate in regular exercise at present, Exercise limited by: respiratory conditions(s);neurologic condition(s)  Goals    .  Diabetes (pt-stated)      Current Barriers:  . Dexamethasone for chemotherapy is significantly increasing blood sugars . Financial barriers  Clinical Pharmacist Goal(s):  Over the next 90 days, patient will demonstrate improved adherence to prescribed treatment plan for diabetes self care/management as evidenced by:  . daily monitoring and recording of CBG  . Lowered A1c   Over the next 14 days, Mr.. Crumpler will provide the necessary supplementary documents (proof of  out of pocket prescription expenditure, proof of household income) needed for medication assistance applications to CCM pharmacist.    Interventions:  . Reviewed medications with patient and discussed importance of medication adherence . Dr. Ancil Boozer to prescribe OneTouch glucometer, strips, and lancets  to [facility] on [address]. . Provided patient with Novonordisk application for Tresiba insulin o Updated 9/15: reviewed proper documents needed, reviewed availability of sample o Updated 6/64: application completed, Mrs. Candela dropping off tomorrow at [facility] o Updated 10/6: patient called because he is almost out of [place]. Directed patient to call the practice, reviewed that he still has not turned in NovoNordisk application o Updated 40/34: patient returned missing NovoNordisk application elements; submitted application  o Updated 74/25: contacted [employer] regarding patient's application; faxed in insurance cards   Patient Self Care Activities:  . Self administers oral medications as prescribed . Self administers insulin as prescribed . Checks blood sugars as prescribed and utilize hyper and hypoglycemia protocol as needed  Please see past updates related to this goal by clicking on the "Past Updates" button in the selected goal        Depression Screen PHQ 2/9 Scores 04/18/2020 09/06/2019 08/07/2019 04/07/2019 12/29/2018 08/22/2018 04/07/2018  PHQ - 2 Score 0 0 0 0 0 0 0  PHQ- 9 Score - 0 0 0 0 - 1    Fall Risk Fall Risk  04/18/2020 10/18/2019 09/06/2019 08/07/2019 04/07/2019  Falls in the past year? 0 0 0 0 0  Number falls in past yr: 0 - 0 0 0  Injury with Fall? 0 - 0 0 0  Risk for fall due to : No Fall Risks - - - -  Follow up Falls prevention discussed - Falls evaluation completed - -    Any stairs in or around the home? Yes  If so, are there any without handrails? No  Home free of loose throw rugs in walkways, pet beds, electrical cords, etc? Yes  Adequate lighting in your home to  reduce risk of falls? Yes   ASSISTIVE DEVICES UTILIZED TO PREVENT FALLS:  Life alert? No  Use of a cane, walker or w/c? No  Grab bars in the bathroom? Yes  Shower chair or bench in shower? No  Elevated toilet seat or a handicapped toilet? No   TIMED UP AND GO:  Was the test performed? No . Telephonic visit.   Cognitive Function:     6CIT Screen 04/18/2020  What Year? 0 points  What month? 0 points  What time? 0 points  Count back from 20 0 points  Months in reverse 4 points  Repeat phrase 10 points  Total Score 14    Immunizations Immunization History  Administered Date(s) Administered  . Influenza,inj,Quad PF,6+ Mos 06/04/2016  . Influenza-Unspecified 06/17/2019  . PFIZER SARS-COV-2 Vaccination 10/28/2019, 11/19/2019  . Pneumococcal Conjugate-13 08/22/2018  . Pneumococcal Polysaccharide-23 01/18/2012  . Tdap 01/18/2012  . Zoster 04/03/2016    TDAP status: Up to date   Flu Vaccine status: Up to date   Pneumococcal vaccine status: Up to date   Covid-19 vaccine status: Completed vaccines  Qualifies for Shingles Vaccine? No  - cancer treatment Zostavax completed No   Shingrix Completed?: No.    Education has been provided regarding the importance of this vaccine. Patient has been advised to call insurance company to determine out of pocket expense if they have not yet received this vaccine. Advised may also receive vaccine at local pharmacy or Health Dept. Verbalized acceptance and understanding.  Screening Tests Health Maintenance  Topic Date Due  . OPHTHALMOLOGY EXAM  10/27/2017  . FOOT EXAM  01/06/2019  . PNA vac Low Risk Adult (2 of 2 - PPSV23) 08/23/2019  . URINE MICROALBUMIN  12/29/2019  . INFLUENZA VACCINE  04/07/2020  . COLONOSCOPY  05/05/2020  . HEMOGLOBIN A1C  09/29/2020  . TETANUS/TDAP  01/17/2022  . COVID-19 Vaccine  Completed  . Hepatitis C Screening  Completed    Health Maintenance  Health Maintenance Due  Topic Date Due  . OPHTHALMOLOGY  EXAM  10/27/2017  . FOOT EXAM  01/06/2019  . PNA vac Low Risk Adult (2 of 2 - PPSV23) 08/23/2019  . URINE MICROALBUMIN  12/29/2019  . INFLUENZA VACCINE  04/07/2020    Colorectal cancer screening: Completed 05/06/15. Repeat every 5 years. Pt to discuss with provider.   Lung Cancer Screening: (Low Dose CT Chest recommended if Age 54-80 years, 30 pack-year currently smoking OR have quit w/in 15years.) does not qualify. Pt receiving treatment for lung cancer. Scheduled chest CT 05/02/20.  Additional Screening:  Hepatitis C Screening: does qualify; Completed 02/23/17.   Vision Screening: Recommended annual ophthalmology exams for early detection of glaucoma and other disorders of the eye. Is the patient up to date with their annual eye exam?  No - scheduled for this month Who is the provider or what is the name of the office in which the patient attends annual eye exams? [place] Screening: Recommended annual dental exams for proper oral hygiene  Community Resource Referral / Chronic Care Management: CRR required this visit?  No   CCM required this visit?  No      Plan:     I have personally reviewed and noted the following in the patient's chart:   . Medical and social history . Use of alcohol, tobacco or illicit drugs  . Current medications and supplements . Functional ability and status . Nutritional status . Physical activity . Advanced directives . List of other physicians . Hospitalizations, surgeries, and ER visits in previous 12 months . Vitals . Screenings to include cognitive, depression, and falls . Referrals and appointments  In addition, I have reviewed and discussed with patient certain preventive protocols, quality metrics, and best practice recommendations. A written personalized care plan for preventive services as well as general preventive health recommendations were provided to patient.     Clemetine Marker, LPN   02/19/3793   Nurse  Notes: none

## 2020-04-19 ENCOUNTER — Other Ambulatory Visit: Payer: Self-pay | Admitting: Family Medicine

## 2020-04-19 DIAGNOSIS — E1169 Type 2 diabetes mellitus with other specified complication: Secondary | ICD-10-CM

## 2020-04-22 NOTE — Telephone Encounter (Signed)
mb full pt need to schedule appointment for further refills

## 2020-04-23 ENCOUNTER — Ambulatory Visit: Payer: Medicare Other | Admitting: Internal Medicine

## 2020-04-23 NOTE — Progress Notes (Signed)
Patient ID: Melvin Willis, male    DOB: August 26, 1951, 69 y.o.   MRN: [id number]  PCP: [name], MD  Chief Complaint  Patient presents with  . Follow-up  . Medication Refill    Subjective:   Melvin Willis is a 69 y.o. male, presents to clinic with CC of the following:  Chief Complaint  Patient presents with  . Follow-up  . Medication Refill    HPI:  Patient is a 69 year old male patient of Dr. Ancil Boozer Last visit with her was in November 2020, not in person visit He did see Raelyn Ensign on a follow-up from a hospitalization for diverticulitis in late December 2020, also not an in person visit Followed by hospice and palliative medicine with last visit 03/05/2020 He has been followed fairly regularly by oncology for his metastatic lung cancer, last visit in July 23rd, 2021  Assessment and plan from that visit was as follows: # Adenocarcinoma of the lung metastatic/stage IV-ROS-1 positive: January 01, 2020 CT chest left upper lobe treatment changes; S/p AVASTIN x6;  June 2021 -improved MRI brain lesions.  STABLE.   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   #CKD stage III-creatinine 1.7 [GFR~52]-STABLE  #  DM-monitor-STABLE; BG-133; await A1c;  Urine protein creatinine-pending.    # Peripheral Neuropathy- ? DM; increase the gabapentin 200 mg qhs.  # Blurry vision- Not common side effect of Entrectinib; agree with opthamology evaluation   # DISPOSITION: # follow up in 5 weeks MD;labs- cbc/cmp; CT chest prior---Dr.B  Follows up today. Wife present with him  DMII/peripheral neuropathy:  Medication regimen-Metformin 500 mg twice daily, gabapentin 300 mg nightly for the neuropathy, thinks helping , although notes at times it can be a little problematic Takes medication regularly Checks BS at home, about daily, running low 100's in the morning fasting, no  low readings noted.  Lab Results  Component Value Date   HGBA1C 7.1 (H) 03/29/2020   HGBA1C 9.8 (A) 04/07/2019   HGBA1C 7.2 (A) 12/29/2018   Lab Results  Component Value Date   MICROALBUR 50 12/29/2018   Burns 80 08/22/2018   CREATININE 1.57 (H) 03/29/2020   No longer on ACE, stopped by oncologist noted in the past On a statin Denies increased thirst, increased frequency  Chronic DVT left leg, history of PE : on long term Xarelto,  He was diagnosed with DVT and PE prior to cancer diagnosis back in 2017.  He has some leg edema and wears compression stockings dailyto control symptoms, also often elevates at night as recommended Denies any focal calf swelling or focal calf pains in the recent past Denies easy bruising, dark or black stools, bleeding per rectum   Primary lung cancer left side with metastases  he is under the care of oncologist, Dr. Mickel Fuchs Last follow-up was in July, and last assessment/plan from that note as above  CKD -stage III Lab Results  Component Value Date   CREATININE 1.57 (H) 03/29/2020   BUN 18 03/29/2020   NA 139 03/29/2020   K 4.2 03/29/2020   CL 108 03/29/2020   CO2 23 03/29/2020    History of CVA: initially had dizziness and left side weakness, but no sequela now,  Remains on statin and also on Xarelto, only takes atorvastatin 40 mg 3 times a week Last lipid panel Lab Results  Component Value Date  CHOL 142 08/22/2018   HDL 47 08/22/2018   LDLCALC 80 08/22/2018   TRIG 73 08/22/2018   CHOLHDL 3.0 08/22/2018    Hearing loss: 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  Recent blurry vision -ophthalmology evaluation recommended at last oncology visit, has appt Friday.   Tobacco-never smoker  Patient Active Problem List   Diagnosis Date Noted  . Brain metastases ([place]) 09/25/2019  . Atherosclerosis of aorta ([place])  09/04/2019  . Goals of care, counseling/discussion 08/02/2019  . Primary malignant neoplasm of lung metastatic to other site ([place]) 04/07/2019  . Chronic deep vein thrombosis (DVT) of left lower extremity ([place]) 01/05/2018  . Chronic kidney disease (CKD), stage III (moderate) 11/27/2016  . Old cerebrovascular accident (CVA) without late effect   . Blurred vision, bilateral 06/23/2016  . Primary cancer of left upper lobe of lung ([place]) 06/12/2016  . Mediastinal mass   . History of nephrolithiasis 03/27/2016  . Pharyngeal dysphagia 01/01/2016  . Benign neoplasm of ascending colon   . Benign neoplasm of sigmoid colon   . Benign neoplasm of transverse colon   . Diverticulosis of large intestine without diverticulitis   . Overweight (BMI 25.0-29.9) 04/16/2015  . Controlled diabetes mellitus with chronic kidney disease ([place]) 03/18/2015  . Dyslipidemia 03/18/2015  . Disorder of male genital organ 08/08/2012  . 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 Elevated PSA 08/08/2012      Current Outpatient Medications:  .  atorvastatin (LIPITOR) 40 MG tablet, Take 1 tablet (40 mg total) by mouth 3 (three) times a week., Disp: 48 tablet, Rfl: 1 .  blood glucose meter kit and supplies, Dispense based on patient and insurance preference (Accuchek Aviva Plus, Accuchek Nano). Use up to four times daily as directed. (FOR ICD-10 E10.9, E11.9)., Disp: 1 each, Rfl: 0 .  entrectinib (ROZLYTREK) 200 MG capsule, Take 3 capsules (600 mg total) by mouth daily., Disp: 90 capsule, Rfl: 6 .  gabapentin (NEURONTIN) 300 MG capsule, Take 1 capsule (300 mg total) by mouth at bedtime., Disp: 30 capsule, Rfl: 3 .  metFORMIN (GLUCOPHAGE) 500 MG tablet, TAKE 1 TABLET BY MOUTH TWICE DAILY WITH A MEAL, Disp: 180 tablet, Rfl: 0 .  ondansetron (ZOFRAN) 8 MG tablet, One pill every 8 hours as needed for nausea/vomitting., Disp: 40 tablet, Rfl: 1 .  tamsulosin (FLOMAX) 0.4 MG CAPS capsule, Take 1 capsule by mouth once  daily in the morning, Disp: 90 capsule, Rfl: 3 .  XARELTO 20 MG TABS tablet, TAKE 1 TABLET BY MOUTH ONCE DAILY WITH SUPPER, Disp: 30 tablet, Rfl: 6   No Known Allergies   Past Surgical History:  Procedure Laterality Date  . COLONOSCOPY    . COLONOSCOPY WITH PROPOFOL N/A 05/06/2015   Procedure: COLONOSCOPY WITH PROPOFOL;  Surgeon: Lucilla Lame, MD;  Location: [facility];  Service: Endoscopy;  Laterality: N/A;  ASCENDING COLON POLYPS X 2 TERMINAL ILEUM BIOPSY RANDOM COLON BX. TRANSVERSE COLON POLYP SIGMOID COLON POLYP  . ESOPHAGOGASTRODUODENOSCOPY (EGD) WITH PROPOFOL N/A 05/06/2015   Procedure: ESOPHAGOGASTRODUODENOSCOPY (EGD) WITH PROPOFOL;  Surgeon: Lucilla Lame, MD;  Location: [facility];  Service: Endoscopy;  Laterality: N/A;  GASTRIC BIOPSY X1  . KIDNEY STONE SURGERY  2017     Family History  Problem Relation Age of Onset  . Diabetes Mother   . Diabetes Father   . CAD Father   . Dementia Father   .  Diabetes Sister   . Cancer Maternal Uncle        Prostate  . Cancer Cousin        prostate     Social History   Tobacco Use  . Smoking status: Never Smoker  . Smokeless tobacco: Never Used  Substance Use Topics  . Alcohol use: No    Alcohol/week: 0.0 standard drinks    With staff assistance, above reviewed with the patient today.  ROS: As per HPI, otherwise no specific complaints on a limited and focused system review   No results found for this or any previous visit (from the past 72 hour(s)).   PHQ2/9: Depression screen [facility] 2/9 04/24/2020 04/18/2020 09/06/2019 08/07/2019 04/07/2019  Decreased Interest 0 0 0 0 0  Down, Depressed, Hopeless 0 0 0 0 0  PHQ - 2 Score 0 0 0 0 0  Altered sleeping 0 - 0 0 0  Tired, decreased energy 0 - 0 0 0  Change in appetite 0 - 0 0 0  Feeling bad or failure about yourself  0 - 0 0 0  Trouble concentrating 0 - 0 0 0  Moving slowly or fidgety/restless 0 - 0 0 0  Suicidal thoughts 0 - 0 0 0  PHQ-9 Score 0 - 0 0 0    Difficult doing work/chores Not difficult at all - Not difficult at all - Not difficult at all  Some recent data might be hidden   PHQ-2/9 Result is neg  Fall Risk: Fall Risk  04/24/2020 04/18/2020 10/18/2019 09/06/2019 08/07/2019  Falls in the past year? 0 0 0 0 0  Number falls in past yr: - 0 - 0 0  Injury with Fall? - 0 - 0 0  Risk for fall due to : - No Fall Risks - - -  Follow up - Falls prevention discussed - Falls evaluation completed -      Objective:   Vitals:   04/24/20 0916  BP: 108/78  Pulse: 88  Resp: 16  Temp: 98 F (36.7 C)  TempSrc: Oral  SpO2: 99%  Weight: 167 lb 9.6 oz (76 kg)  Height: '5\' 4"'  (1.626 m)    Body mass index is 28.77 kg/m.  Physical Exam   NAD, masked, very pleasant HEENT - [place]/AT, sclera anicteric, PERRL, EOMI, conj - non-inj'ed, external auditory canals with some mild cerumen bilateral, not impacted, TMs clear, pharynx clear Neck - supple, no adenopathy, no JVD, carotids 2+ and = without bruits bilat Car - RRR without m/g/r Pulm- RR and effort normal at rest, CTA without wheeze or rales Abd - soft, NT, ND, BS+, no masses Back - no CVA tenderness Ext - no marked LE edema on exam this morning, no focal calf pains, no increased erythema or increased warmth in the calf regions bilaterally Diabetic foot exam: No skin breakdown, ulcers Normal DP pulses Normal sensation to light touch  Monofilament testing within normal limits Nails thickened and discolored in the great toenails, with significant onychomycosis concerns diffusely, Neuro/psychiatric - affect was not flat, appropriate with conversation  Alert, able to get down from the exam table and walk to the chair without assistance  Grossly non-focal   Speech normal   Results for orders placed or performed in visit on 03/29/20  Protein / creatinine ratio, urine  Result Value Ref Range   Creatinine, Urine 156 mg/dL   Total Protein, Urine 12 mg/dL   Protein Creatinine Ratio 0.08 0.00 -  0.15 mg/mg[Cre]  Hemoglobin A1c  Result Value Ref Range   Hgb A1c MFr Bld 7.1 (H) 4.8 - 5.6 %   Mean Plasma Glucose 157.07 mg/dL  Comprehensive metabolic panel  Result Value Ref Range   Sodium 139 135 - 145 mmol/L   Potassium 4.2 3.5 - 5.1 mmol/L   Chloride 108 98 - 111 mmol/L   CO2 23 22 - 32 mmol/L   Glucose, Bld 133 (H) 70 - 99 mg/dL   BUN 18 8 - 23 mg/dL   Creatinine, Ser 1.57 (H) 0.61 - 1.24 mg/dL   Calcium 9.1 8.9 - 10.3 mg/dL   Total Protein 7.3 6.5 - 8.1 g/dL   Albumin 4.2 3.5 - 5.0 g/dL   AST 22 15 - 41 U/L   ALT 28 0 - 44 U/L   Alkaline Phosphatase 46 38 - 126 U/L   Total Bilirubin 0.8 0.3 - 1.2 mg/dL   GFR calc non Af Amer 45 (L) >60 mL/min   GFR calc Af Amer 52 (L) >60 mL/min   Anion gap 8 5 - 15  CBC with Differential  Result Value Ref Range   WBC 4.7 4.0 - 10.5 K/uL   RBC 4.46 4.22 - 5.81 MIL/uL   Hemoglobin 13.9 13.0 - 17.0 g/dL   HCT 39.9 39 - 52 %   MCV 89.5 80.0 - 100.0 fL   MCH 31.2 26.0 - 34.0 pg   MCHC 34.8 30.0 - 36.0 g/dL   RDW 14.6 11.5 - 15.5 %   Platelets 176 150 - 400 K/uL   nRBC 0.0 0.0 - 0.2 %   Neutrophils Relative % 37 %   Neutro Abs 1.7 1.7 - 7.7 K/uL   Lymphocytes Relative 45 %   Lymphs Abs 2.1 0.7 - 4.0 K/uL   Monocytes Relative 14 %   Monocytes Absolute 0.7 0 - 1 K/uL   Eosinophils Relative 3 %   Eosinophils Absolute 0.2 0 - 0 K/uL   Basophils Relative 1 %   Basophils Absolute 0.0 0 - 0 K/uL   Immature Granulocytes 0 %   Abs Immature Granulocytes 0.00 0.00 - 0.07 K/uL    Last labs reviewed Assessment & Plan:   1. Controlled type 2 diabetes mellitus with chronic kidney disease, without long-term current use of insulin, unspecified CKD stage (HCC) Last A1c was reasonable, 7.1 and was in late July. No need to recheck today. Blood sugars have been good on home checks Continue the Metformin product,. Emphasized if his checks at home start to decrease significantly, and become in the 70s or 60s with any regularity, or if any lower  readings occurring, he needs to contact us and potentially lower the Metformin dose or stop it over time pending his status. Continue to monitor. - metFORMIN (GLUCOPHAGE) 500 MG tablet; TAKE 1 TABLET BY MOUTH TWICE DAILY WITH A MEAL  Dispense: 180 tablet; Refill: 1  2. Stage 3 chronic kidney disease, unspecified whether stage 3a or 3b CKD Continue to monitor presently, with last labs reviewed  3. Other specified hearing loss of both ears Notes this has improved after he had his ears irrigated earlier this year, and remains improved. No cerumen impaction noted on exam today Continue to follow-up  4. Old cerebrovascular accident (CVA) without late effect Remains on a statin with the dose as noted above   5. Primary malignant neoplasm of lung metastatic to other site, unspecified laterality ([place]) Continues to follow with oncology.  6. Chronic deep vein thrombosis (DVT) of left lower extremity, unspecified vein (HCC)  On Xarelto No recent concerns for bleeding noted and recent CBC checked in July.  7. Dyslipidemia Last lipid panel was good, and he notes the statin was started after his CVA. Last lipid check was in December 2019, and do not feel need to stick him today and draw blood to obtain a lipid panel.  8. Recent blurred vision concern Has an appointment with ophthalmology scheduled for Friday. To keep that appointment and await their assessment   Tentatively schedule a follow-up again in 3 months time, can follow-up sooner as needed, and continuing to follow with oncology important as well.     Towanda Malkin, MD 04/24/20 9:23 AM

## 2020-04-24 ENCOUNTER — Encounter: Payer: Self-pay | Admitting: Internal Medicine

## 2020-04-24 ENCOUNTER — Other Ambulatory Visit: Payer: Self-pay

## 2020-04-24 ENCOUNTER — Ambulatory Visit (INDEPENDENT_AMBULATORY_CARE_PROVIDER_SITE_OTHER): Payer: Medicare Other | Admitting: Internal Medicine

## 2020-04-24 VITALS — BP 108/78 | HR 88 | Temp 98.0°F | Resp 16 | Ht 64.0 in | Wt 167.6 lb

## 2020-04-24 DIAGNOSIS — N183 Chronic kidney disease, stage 3 unspecified: Secondary | ICD-10-CM | POA: Diagnosis not present

## 2020-04-24 DIAGNOSIS — E1122 Type 2 diabetes mellitus with diabetic chronic kidney disease: Secondary | ICD-10-CM

## 2020-04-24 DIAGNOSIS — C3490 Malignant neoplasm of unspecified part of unspecified bronchus or lung: Secondary | ICD-10-CM | POA: Diagnosis not present

## 2020-04-24 DIAGNOSIS — E785 Hyperlipidemia, unspecified: Secondary | ICD-10-CM | POA: Diagnosis not present

## 2020-04-24 DIAGNOSIS — E1169 Type 2 diabetes mellitus with other specified complication: Secondary | ICD-10-CM

## 2020-04-24 DIAGNOSIS — H918X3 Other specified hearing loss, bilateral: Secondary | ICD-10-CM | POA: Diagnosis not present

## 2020-04-24 DIAGNOSIS — I82502 Chronic embolism and thrombosis of unspecified deep veins of left lower extremity: Secondary | ICD-10-CM

## 2020-04-24 DIAGNOSIS — Z8673 Personal history of transient ischemic attack (TIA), and cerebral infarction without residual deficits: Secondary | ICD-10-CM | POA: Diagnosis not present

## 2020-04-24 MED ORDER — METFORMIN HCL 500 MG PO TABS: ORAL_TABLET | ORAL | 1 refills | Status: DC

## 2020-05-02 ENCOUNTER — Ambulatory Visit
Admission: RE | Admit: 2020-05-02 | Discharge: 2020-05-02 | Disposition: A | Discharge status: Home / Self Care | Payer: Medicare Other | Source: Ambulatory Visit | Attending: Internal Medicine | Admitting: Internal Medicine

## 2020-05-02 ENCOUNTER — Other Ambulatory Visit: Payer: Self-pay

## 2020-05-02 DIAGNOSIS — I2510 Atherosclerotic heart disease of native coronary artery without angina pectoris: Secondary | ICD-10-CM | POA: Diagnosis not present

## 2020-05-02 DIAGNOSIS — J8410 Pulmonary fibrosis, unspecified: Secondary | ICD-10-CM | POA: Diagnosis not present

## 2020-05-02 DIAGNOSIS — C3412 Malignant neoplasm of upper lobe, left bronchus or lung: Secondary | ICD-10-CM | POA: Diagnosis not present

## 2020-05-02 DIAGNOSIS — J984 Other disorders of lung: Secondary | ICD-10-CM | POA: Diagnosis not present

## 2020-05-02 DIAGNOSIS — I700 Atherosclerosis of aorta: Secondary | ICD-10-CM | POA: Diagnosis not present

## 2020-05-06 ENCOUNTER — Encounter: Payer: Self-pay | Admitting: Internal Medicine

## 2020-05-06 ENCOUNTER — Inpatient Hospital Stay: Payer: Medicare Other | Attending: Internal Medicine

## 2020-05-06 ENCOUNTER — Inpatient Hospital Stay: Payer: Medicare Other | Admitting: Pharmacist

## 2020-05-06 ENCOUNTER — Other Ambulatory Visit: Payer: Self-pay

## 2020-05-06 ENCOUNTER — Inpatient Hospital Stay (HOSPITAL_BASED_OUTPATIENT_CLINIC_OR_DEPARTMENT_OTHER): Payer: Medicare Other | Admitting: Internal Medicine

## 2020-05-06 DIAGNOSIS — Z86010 Personal history of colonic polyps: Secondary | ICD-10-CM | POA: Diagnosis not present

## 2020-05-06 DIAGNOSIS — R5383 Other fatigue: Secondary | ICD-10-CM | POA: Diagnosis not present

## 2020-05-06 DIAGNOSIS — N183 Chronic kidney disease, stage 3 unspecified: Secondary | ICD-10-CM | POA: Diagnosis not present

## 2020-05-06 DIAGNOSIS — Z8719 Personal history of other diseases of the digestive system: Secondary | ICD-10-CM | POA: Diagnosis not present

## 2020-05-06 DIAGNOSIS — C3412 Malignant neoplasm of upper lobe, left bronchus or lung: Secondary | ICD-10-CM

## 2020-05-06 DIAGNOSIS — G629 Polyneuropathy, unspecified: Secondary | ICD-10-CM | POA: Diagnosis not present

## 2020-05-06 DIAGNOSIS — Z79899 Other long term (current) drug therapy: Secondary | ICD-10-CM | POA: Insufficient documentation

## 2020-05-06 DIAGNOSIS — Z7984 Long term (current) use of oral hypoglycemic drugs: Secondary | ICD-10-CM | POA: Insufficient documentation

## 2020-05-06 DIAGNOSIS — E1122 Type 2 diabetes mellitus with diabetic chronic kidney disease: Secondary | ICD-10-CM | POA: Diagnosis not present

## 2020-05-06 DIAGNOSIS — Z818 Family history of other mental and behavioral disorders: Secondary | ICD-10-CM | POA: Diagnosis not present

## 2020-05-06 DIAGNOSIS — C7951 Secondary malignant neoplasm of bone: Secondary | ICD-10-CM | POA: Diagnosis not present

## 2020-05-06 DIAGNOSIS — C7931 Secondary malignant neoplasm of brain: Secondary | ICD-10-CM | POA: Diagnosis not present

## 2020-05-06 DIAGNOSIS — M2550 Pain in unspecified joint: Secondary | ICD-10-CM | POA: Diagnosis not present

## 2020-05-06 DIAGNOSIS — E785 Hyperlipidemia, unspecified: Secondary | ICD-10-CM | POA: Insufficient documentation

## 2020-05-06 DIAGNOSIS — Z833 Family history of diabetes mellitus: Secondary | ICD-10-CM | POA: Insufficient documentation

## 2020-05-06 DIAGNOSIS — Z86711 Personal history of pulmonary embolism: Secondary | ICD-10-CM | POA: Insufficient documentation

## 2020-05-06 DIAGNOSIS — Z8042 Family history of malignant neoplasm of prostate: Secondary | ICD-10-CM | POA: Insufficient documentation

## 2020-05-06 DIAGNOSIS — I82402 Acute embolism and thrombosis of unspecified deep veins of left lower extremity: Secondary | ICD-10-CM | POA: Insufficient documentation

## 2020-05-06 DIAGNOSIS — H538 Other visual disturbances: Secondary | ICD-10-CM | POA: Insufficient documentation

## 2020-05-06 DIAGNOSIS — Z8249 Family history of ischemic heart disease and other diseases of the circulatory system: Secondary | ICD-10-CM | POA: Diagnosis not present

## 2020-05-06 DIAGNOSIS — Z7901 Long term (current) use of anticoagulants: Secondary | ICD-10-CM | POA: Insufficient documentation

## 2020-05-06 DIAGNOSIS — Z8673 Personal history of transient ischemic attack (TIA), and cerebral infarction without residual deficits: Secondary | ICD-10-CM | POA: Diagnosis not present

## 2020-05-06 LAB — COMPREHENSIVE METABOLIC PANEL
ALT: 28 U/L (ref 0–44)
AST: 24 U/L (ref 15–41)
Albumin: 4.3 g/dL (ref 3.5–5.0)
Alkaline Phosphatase: 50 U/L (ref 38–126)
Anion gap: 9 (ref 5–15)
BUN: 23 mg/dL (ref 8–23)
CO2: 23 mmol/L (ref 22–32)
Calcium: 9.2 mg/dL (ref 8.9–10.3)
Chloride: 106 mmol/L (ref 98–111)
Creatinine, Ser: 1.62 mg/dL — ABNORMAL HIGH (ref 0.61–1.24)
GFR calc Af Amer: 51 mL/min — ABNORMAL LOW (ref >60)
GFR calc non Af Amer: 44 mL/min — ABNORMAL LOW (ref >60)
Glucose, Bld: 283 mg/dL — ABNORMAL HIGH (ref 70–99)
Potassium: 4.4 mmol/L (ref 3.5–5.1)
Sodium: 138 mmol/L (ref 135–145)
Total Bilirubin: 0.7 mg/dL (ref 0.3–1.2)
Total Protein: 7.6 g/dL (ref 6.5–8.1)

## 2020-05-06 LAB — CBC WITH DIFFERENTIAL/PLATELET
Abs Immature Granulocytes: 0.01 10*3/uL (ref 0.00–0.07)
Basophils Absolute: 0 10*3/uL (ref 0.0–0.1)
Basophils Relative: 1 %
Eosinophils Absolute: 0.1 10*3/uL (ref 0.0–0.5)
Eosinophils Relative: 2 %
HCT: 42.7 % (ref 39.0–52.0)
Hemoglobin: 14.8 g/dL (ref 13.0–17.0)
Immature Granulocytes: 0 %
Lymphocytes Relative: 40 %
Lymphs Abs: 2 10*3/uL (ref 0.7–4.0)
MCH: 30.6 pg (ref 26.0–34.0)
MCHC: 34.7 g/dL (ref 30.0–36.0)
MCV: 88.2 fL (ref 80.0–100.0)
Monocytes Absolute: 0.5 10*3/uL (ref 0.1–1.0)
Monocytes Relative: 10 %
Neutro Abs: 2.4 10*3/uL (ref 1.7–7.7)
Neutrophils Relative %: 47 %
Platelets: 191 10*3/uL (ref 150–400)
RBC: 4.84 MIL/uL (ref 4.22–5.81)
RDW: 13.9 % (ref 11.5–15.5)
WBC: 5.1 10*3/uL (ref 4.0–10.5)
nRBC: 0 % (ref 0.0–0.2)

## 2020-05-06 NOTE — Assessment & Plan Note (Addendum)
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   # CKD stage III-creatinine 1.7 [GFR~52]-STABLE  #  DM-monitor-STABLE; BG-133;A1c-7.4;   # Peripheral Neuropathy- ? DM;continue  gabapentin 200 mg qhs.STABLE  # Blurry vision- Not common side effect of Entrectinib; agree with opthamology evaluation   # DISPOSITION: # follow up in 4 weeks MD;labs- cbc/cmp; --Dr.B  # I reviewed the blood work- with the patient in detail; also reviewed the imaging independently [as summarized above]; and with the patient in detail.

## 2020-05-06 NOTE — Progress Notes (Signed)
[facility] OFFICE PROGRESS NOTE  Patient Care Team: Sowles, Krichna, MD as PCP - General (Family Medicine) ,  R, MD as Medical Oncologist (Medical Oncology) Hedrick, Julie E, RPH (Pharmacist)  Cancer Staging No matching staging information was found for the patient.   Oncology History Overview Note  # OCT 2017- ADENO CA LUNG; STAGE IV [; LUL; bil supraclavicular LN; Left neck LN Bx]; ROS-1 MUTATED; s/p Carbo-alimta x1[oct 2017]  # NOV 1st 2017- XALKORI 250 mg BID; JAN 15th CT- PR;  # AUG 27th Chemo-RT to persistent LUL/mediastinal LN [s/p carbo-taxol- with RT; finished Sep 22nd 2018];   # July 14th 2020- multiple metastatic lesions of the brain [July 30 whole brain radiation finished April 21, 2019]; bone scan skull metastases /posterior left ninth rib metastases; April 04, 2019 stopped crizotinib;  #April 25, 2019-start Entrectinib 600 mg once a day; STopped in NOV 2020 [dizziness]; NOV 2020-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   # LLE DVT/bil PE/Multiple strokes [? On xarelto]-Lovenox; Jan mid 2018- xarelto [lovenox-insurance issues]  # MRI brain- multiple infarcts [2d echo/bubble study-NEG's/p Neurology eval]  ------------------------------------------------------------    # # s/p TURP [Sep 2017; Dr.Cope] DEC 26th CT- distended bladder;  # Elevated PSA-  FEB 2021- 12;  S/p Bx- Negative; follow with urology/Dr.Stoioff   # MOLECULAR TESTING- ROS-1 POSITIVE; ALK/EGFR-NEG; PDL-1 EXPRESSION- 90%** [HIGH]  # DEC 15th 2020- PALLIATIVE CARE -----------------------------------------------------------------    DIAGNOSIS: Adenocarcinoma the lung  ROS-1 +  STAGE:  IV    ;GOALS: Palliative  CURRENT/MOST RECENT  THERAPY- Avastin [C]   Primary cancer of left upper lobe of lung (HCC)  08/09/2019 -  Chemotherapy   The patient had bevacizumab-awwb (MVASI) 800 mg in sodium chloride 0.9 % 100 mL chemo infusion, 725 mg, Intravenous,  Once, 6 of 6 cycles Dose modification: 10 mg/kg (original dose 10 mg/kg, Cycle 3, Reason: Other (see comments), Comment: weight change) Administration: 800 mg (08/09/2019), 800 mg (08/22/2019), 700 mg (09/19/2019), 700 mg (10/04/2019), 700 mg (10/18/2019), 700 mg (11/01/2019)  for chemotherapy treatment.        INTERVAL HISTORY:  Melvin Willis 69 y.o.  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  Denies any headaches but denies any nausea vomiting.  Continues to have blurry vision.  Not gotten any better again not any worse.  Awaiting ophthalmology evaluation.   Review of Systems  Constitutional: Positive for malaise/fatigue. Negative for chills, diaphoresis, fever and weight loss.  HENT: Negative for nosebleeds and sore throat.   Eyes: Negative for double vision.  Respiratory: Negative for cough, hemoptysis, sputum production, shortness of breath and wheezing.   Cardiovascular: Negative for chest pain (Left upper chest wall pain chronic), palpitations and orthopnea.  Gastrointestinal: Negative for abdominal pain, blood in stool, constipation, diarrhea, heartburn and melena.  Genitourinary: Negative for dysuria.  Musculoskeletal: Positive for joint pain. Negative for back pain.  Skin: Negative.  Negative for itching and rash.  Neurological: Positive for tingling and sensory change. Negative for focal weakness and weakness.  Endo/Heme/Allergies: Does not bruise/bleed easily.  Psychiatric/Behavioral: Positive for memory loss. Negative for depression. The patient   is  not nervous/anxious and does not have insomnia.     PAST MEDICAL HISTORY :  Past Medical History:  Diagnosis Date  . Abnormal prostate specific antigen 08/08/2012  . Adiposity 04/16/2015  . Chronic kidney disease (CKD), stage III (moderate) 11/27/2016  . CVA (cerebral vascular accident) ([place]) 06/17/2016  . Diabetes mellitus without complication ([place])   . Diverticulosis of sigmoid colon 04/16/2015  . Dyslipidemia 03/18/2015  . Hemorrhoids, internal 04/16/2015  . Hypercholesteremia 04/16/2015  . Hyperlipidemia   . Hypertension   . Primary cancer of left upper lobe of lung ([place])   . Pulmonary embolism ([place])   . Wears dentures    partial upper    PAST SURGICAL HISTORY :   Past Surgical History:  Procedure Laterality Date  . COLONOSCOPY    . COLONOSCOPY WITH PROPOFOL N/A 05/06/2015   Procedure: COLONOSCOPY WITH PROPOFOL;  Surgeon: Lucilla Lame, MD;  Location: [facility];  Service: Endoscopy;  Laterality: N/A;  ASCENDING COLON POLYPS X 2 TERMINAL ILEUM BIOPSY RANDOM COLON BX. TRANSVERSE COLON POLYP SIGMOID COLON POLYP  . ESOPHAGOGASTRODUODENOSCOPY (EGD) WITH PROPOFOL N/A 05/06/2015   Procedure: ESOPHAGOGASTRODUODENOSCOPY (EGD) WITH PROPOFOL;  Surgeon: Lucilla Lame, MD;  Location: [facility];  Service: Endoscopy;  Laterality: N/A;  GASTRIC BIOPSY X1  . KIDNEY STONE SURGERY  2017    FAMILY HISTORY :   Family History  Problem Relation Age of Onset  . Diabetes Mother   . Diabetes Father   . CAD Father   . Dementia Father   . Diabetes Sister   . Cancer Maternal Uncle        Prostate  . Cancer Cousin        prostate    SOCIAL HISTORY:   Social History   Tobacco Use  . Smoking status: Never Smoker  . Smokeless tobacco: Never Used  Vaping Use  . Vaping Use: Never used  Substance Use Topics  . Alcohol use: No    Alcohol/week: 0.0 standard drinks  . Drug use: No    ALLERGIES:  has No Known Allergies.  MEDICATIONS:  Current Outpatient Medications  Medication  Sig Dispense Refill  . atorvastatin (LIPITOR) 40 MG tablet Take 1 tablet (40 mg total) by mouth 3 (three) times a week. 48 tablet 1  . blood glucose meter kit and supplies Dispense based on patient and insurance preference (Accuchek Aviva Plus, Accuchek Nano). Use up to four times daily as directed. (FOR ICD-10 E10.9, E11.9). 1 each 0  . entrectinib (ROZLYTREK) 200 MG capsule Take 3 capsules (600 mg total) by mouth daily. 90 capsule 6  . gabapentin (NEURONTIN) 300 MG capsule Take 1 capsule (300 mg total) by mouth at bedtime. 30 capsule 3  . metFORMIN (GLUCOPHAGE) 500 MG tablet TAKE 1 TABLET BY MOUTH TWICE DAILY WITH A MEAL 180 tablet 1  . ondansetron (ZOFRAN) 8 MG tablet One pill every 8 hours as needed for nausea/vomitting. 40 tablet 1  . tamsulosin (FLOMAX) 0.4 MG CAPS capsule Take 1 capsule by mouth once daily in the morning 90 capsule 3  . XARELTO 20 MG TABS tablet TAKE 1 TABLET BY MOUTH ONCE DAILY WITH SUPPER 30 tablet 6   No current facility-administered medications for this visit.    PHYSICAL EXAMINATION: ECOG PERFORMANCE STATUS: 0 - Asymptomatic  BP (!) 147/88 (BP Location: Left Arm, Patient Position: Sitting, Cuff Size: Normal)   Pulse 87   Temp (!) 96.9 F (36.1 C) (Tympanic)   Resp 20   Ht 5' 4" (  1.626 m)   Wt 169 lb (76.7 kg)   SpO2 99%   BMI 29.01 kg/m   Filed Weights   05/06/20 1320  Weight: 169 lb (76.7 kg)    Physical Exam Constitutional:      Comments: Patient is accompanied by his wife.  He is walking by himself.  HENT:     Head: Normocephalic and atraumatic.     Mouth/Throat:     Pharynx: No oropharyngeal exudate.  Eyes:     Pupils: Pupils are equal, round, and reactive to light.  Cardiovascular:     Rate and Rhythm: Normal rate and regular rhythm.  Pulmonary:     Effort: No respiratory distress.     Breath sounds: No wheezing.  Abdominal:     General: Bowel sounds are normal. There is no distension.     Palpations: Abdomen is soft. There is no mass.      Tenderness: There is no abdominal tenderness. There is no guarding or rebound.  Musculoskeletal:        General: No tenderness. Normal range of motion.     Cervical back: Normal range of motion and neck supple.  Skin:    General: Skin is warm.  Neurological:     Mental Status: He is alert and oriented to person, place, and time.  Psychiatric:        Mood and Affect: Affect normal.    LABORATORY DATA:  I have reviewed the data as listed    Component Value Date/Time   NA 138 05/06/2020 1300   NA 139 01/01/2016 1000   K 4.4 05/06/2020 1300   CL 106 05/06/2020 1300   CO2 23 05/06/2020 1300   GLUCOSE 283 (H) 05/06/2020 1300   BUN 23 05/06/2020 1300   BUN 13 01/01/2016 1000   CREATININE 1.62 (H) 05/06/2020 1300   CREATININE 1.32 (H) 08/22/2018 1107   CALCIUM 9.2 05/06/2020 1300   PROT 7.6 05/06/2020 1300   PROT 7.2 01/01/2016 1000   ALBUMIN 4.3 05/06/2020 1300   ALBUMIN 4.4 01/01/2016 1000   AST 24 05/06/2020 1300   ALT 28 05/06/2020 1300   ALKPHOS 50 05/06/2020 1300   BILITOT 0.7 05/06/2020 1300   BILITOT 0.4 01/01/2016 1000   GFRNONAA 44 (L) 05/06/2020 1300   GFRNONAA 56 (L) 08/22/2018 1107   GFRAA 51 (L) 05/06/2020 1300   GFRAA 65 08/22/2018 1107    No results found for: SPEP, UPEP  Lab Results  Component Value Date   WBC 5.1 05/06/2020   NEUTROABS 2.4 05/06/2020   HGB 14.8 05/06/2020   HCT 42.7 05/06/2020   MCV 88.2 05/06/2020   PLT 191 05/06/2020      Chemistry      Component Value Date/Time   NA 138 05/06/2020 1300   NA 139 01/01/2016 1000   K 4.4 05/06/2020 1300   CL 106 05/06/2020 1300   CO2 23 05/06/2020 1300   BUN 23 05/06/2020 1300   BUN 13 01/01/2016 1000   CREATININE 1.62 (H) 05/06/2020 1300   CREATININE 1.32 (H) 08/22/2018 1107      Component Value Date/Time   CALCIUM 9.2 05/06/2020 1300   ALKPHOS 50 05/06/2020 1300   AST 24 05/06/2020 1300   ALT 28 05/06/2020 1300   BILITOT 0.7 05/06/2020 1300   BILITOT 0.4 01/01/2016 1000        RADIOGRAPHIC STUDIES: I have personally reviewed the radiological images as listed and agreed with the findings in the report. No results found.  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   # CKD stage III-creatinine 1.7 [GFR~52]-STABLE  #  DM-monitor-STABLE; BG-133;A1c-7.4;   # Peripheral Neuropathy- ? DM;continue  gabapentin 200 mg qhs.STABLE  # Blurry vision- Not common side effect of Entrectinib; agree with opthamology evaluation   # DISPOSITION: # follow up in 4 weeks MD;labs- cbc/cmp; --Dr.B  # I reviewed the blood work- with the patient in detail; also reviewed the imaging independently [as summarized above]; and with the patient in detail.        Orders Placed This Encounter  Procedures  . CBC with Differential    Standing Status:   Standing    Number of Occurrences:   20    Standing Expiration Date:   05/06/2021  . Comprehensive metabolic panel    Standing Status:   Standing    Number of Occurrences:   20    Standing Expiration Date:   05/06/2021   All questions were answered. The patient knows to call the clinic with any problems, questions or concerns.       R , MD 05/06/2020 2:28 PM  

## 2020-05-06 NOTE — Progress Notes (Signed)
[facility]  [phone] [phone]  Patient Care Team: [name], [name], MD as Medical Oncologist (Medical Oncology) Cathi Roan, [facility] (Pharmacist)   Name of the patient: Melvin Willis  [id number]  September 15, 1950   Date of visit: 05/06/20  HPI: Patient is a 69 y.o. male with stage IV lung adenocarcinoma. ROS-1 mutation positive. Currently treated with Rozlytrek (entrectinib)  Reason for Consult: Oral chemotherapy follow-up for entrectinib therapy.   PAST MEDICAL HISTORY: Past Medical History:  Diagnosis Date  . Abnormal prostate specific antigen 08/08/2012  . Adiposity 04/16/2015  . Chronic kidney disease (CKD), stage III (moderate) 11/27/2016  . CVA (cerebral vascular accident) ([place]) 06/17/2016  . Diabetes mellitus without complication ([place])   . Diverticulosis of sigmoid colon 04/16/2015  . Dyslipidemia 03/18/2015  . Hemorrhoids, internal 04/16/2015  . Hypercholesteremia 04/16/2015  . Hyperlipidemia   . Hypertension   . Primary cancer of left upper lobe of lung ([place])   . Pulmonary embolism ([place])   . Wears dentures    partial upper    PAST SURGICAL HISTORY:  Past Surgical History:  Procedure Laterality Date  . COLONOSCOPY    . COLONOSCOPY WITH PROPOFOL N/A 05/06/2015   Procedure: COLONOSCOPY WITH PROPOFOL;  Surgeon: Lucilla Lame, MD;  Location: [facility];  Service: Endoscopy;  Laterality: N/A;  ASCENDING COLON POLYPS X 2 TERMINAL ILEUM BIOPSY RANDOM COLON BX. TRANSVERSE COLON POLYP SIGMOID COLON POLYP  . ESOPHAGOGASTRODUODENOSCOPY (EGD) WITH PROPOFOL N/A 05/06/2015   Procedure: ESOPHAGOGASTRODUODENOSCOPY (EGD) WITH PROPOFOL;  Surgeon: Lucilla Lame, MD;  Location: [facility];  Service: Endoscopy;  Laterality: N/A;  GASTRIC BIOPSY X1  . KIDNEY STONE SURGERY  2017    HEMATOLOGY/ONCOLOGY HISTORY:  Oncology History Overview Note    # OCT 2017- ADENO CA LUNG; STAGE IV [; LUL; bil supraclavicular LN; Left neck LN Bx]; ROS-1 MUTATED; s/p Carbo-alimta x1[oct 2017]  # NOV 1st 2017- XALKORI 250 mg BID; JAN 15th CT- PR;  # AUG 27th Chemo-RT to persistent LUL/mediastinal LN [s/p carbo-taxol- with RT; finished Sep 22nd 2018];   # July 14th 2020- multiple metastatic lesions of the brain [July 30 whole brain radiation finished April 21, 2019]; bone scan skull metastases /posterior left ninth rib metastases; April 04, 2019 stopped crizotinib;  #April 25, 2019-start Entrectinib 600 mg once a day; STopped in NOV 2020 [dizziness]; NOV 2020-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   # LLE DVT/bil PE/Multiple strokes [? On xarelto]-Lovenox; Jan mid 2018- xarelto [lovenox-insurance issues]  # MRI brain- multiple infarcts [2d echo/bubble study-NEG's/p Neurology eval]  ------------------------------------------------------------    # # s/p TURP [Sep 2017; Dr.Cope] DEC 26th CT- distended bladder;  # Elevated PSA-  FEB 2021- 12;  S/p Bx- Negative; follow with urology/Dr.Stoioff   # MOLECULAR TESTING- ROS-1 POSITIVE; ALK/EGFR-NEG; PDL-1 EXPRESSION- 90%** [HIGH]  # DEC 15th 2020- PALLIATIVE CARE -----------------------------------------------------------------    DIAGNOSIS: Adenocarcinoma the lung  ROS-1 +  STAGE:  IV    ;GOALS: Palliative  CURRENT/MOST RECENT THERAPY- Avastin [C]   Primary cancer of left upper lobe of lung ([place])  08/09/2019 -  Chemotherapy   The patient had bevacizumab-awwb (MVASI) 800 mg in sodium chloride 0.9 % 100 mL chemo infusion, 725 mg, Intravenous,  Once, 6 of 6 cycles Dose modification: 10 mg/kg (original dose 10 mg/kg, Cycle 3, Reason: Other (see comments), Comment: weight  change) Administration: 800 mg (08/09/2019), 800 mg (08/22/2019), 700 mg (09/19/2019), 700 mg (10/04/2019), 700 mg (10/18/2019), 700 mg (11/01/2019)  for chemotherapy treatment.      ALLERGIES:  has No Known Allergies.  MEDICATIONS:  Current Outpatient Medications  Medication Sig Dispense Refill  . atorvastatin (LIPITOR) 40 MG tablet Take 1 tablet (40 mg total) by mouth 3 (three) times a week. 48 tablet 1  . blood glucose meter kit and supplies Dispense based on patient and insurance preference (Accuchek Aviva Plus, Accuchek Nano). Use up to four times daily as directed. (FOR ICD-10 E10.9, E11.9). 1 each 0  . entrectinib (ROZLYTREK) 200 MG capsule Take 3 capsules (600 mg total) by mouth daily. 90 capsule 6  . gabapentin (NEURONTIN) 300 MG capsule Take 1 capsule (300 mg total) by mouth at bedtime. 30 capsule 3  . metFORMIN (GLUCOPHAGE) 500 MG tablet TAKE 1 TABLET BY MOUTH TWICE DAILY WITH A MEAL 180 tablet 1  . ondansetron (ZOFRAN) 8 MG tablet One pill every 8 hours as needed for nausea/vomitting. 40 tablet 1  . tamsulosin (FLOMAX) 0.4 MG CAPS capsule Take 1 capsule by mouth once daily in the morning 90 capsule 3  . XARELTO 20 MG TABS tablet TAKE 1 TABLET BY MOUTH ONCE DAILY WITH SUPPER 30 tablet 6   No current facility-administered medications for this visit.    VITAL SIGNS: There were no vitals taken for this visit. There were no vitals filed for this visit.  Estimated body mass index is 29.01 kg/m as calculated from the following:   Height as of an earlier encounter on 05/06/20: _0  (1.626 m).   Weight as of an earlier encounter on 05/06/20: 76.7 kg (169 lb).  LABS: CBC:    Component Value Date/Time   WBC 5.1 05/06/2020 1300   HGB 14.8 05/06/2020 1300   HGB 14.1 01/01/2016 1000   HCT 42.7 05/06/2020 1300   HCT 42.7 01/01/2016 1000   PLT 191 05/06/2020 1300   PLT 307 01/01/2016 1000   MCV 88.2 05/06/2020 1300   MCV 86 01/01/2016 1000   NEUTROABS 2.4 05/06/2020 1300    NEUTROABS 4.6 01/01/2016 1000   LYMPHSABS 2.0 05/06/2020 1300   LYMPHSABS 1.6 01/01/2016 1000   MONOABS 0.5 05/06/2020 1300   EOSABS 0.1 05/06/2020 1300   EOSABS 0.3 01/01/2016 1000   BASOSABS 0.0 05/06/2020 1300   BASOSABS 0.0 01/01/2016 1000   Comprehensive Metabolic Panel:    Component Value Date/Time   NA 138 05/06/2020 1300   NA 139 01/01/2016 1000   K 4.4 05/06/2020 1300   CL 106 05/06/2020 1300   CO2 23 05/06/2020 1300   BUN 23 05/06/2020 1300   BUN 13 01/01/2016 1000   CREATININE 1.62 (H) 05/06/2020 1300   CREATININE 1.32 (H) 08/22/2018 1107   GLUCOSE 283 (H) 05/06/2020 1300   CALCIUM 9.2 05/06/2020 1300   AST 24 05/06/2020 1300   ALT 28 05/06/2020 1300   ALKPHOS 50 05/06/2020 1300   BILITOT 0.7 05/06/2020 1300   BILITOT 0.4 01/01/2016 1000   PROT 7.6 05/06/2020 1300   PROT 7.2 01/01/2016 1000   ALBUMIN 4.3 05/06/2020 1300   ALBUMIN 4.4 01/01/2016 1000    RADIOGRAPHIC STUDIES: CT CHEST WO CONTRAST  Result Date: 05/02/2020 CLINICAL DATA:  Metastatic non-small cell lung cancer,, assess treatment response EXAM: CT CHEST WITHOUT CONTRAST TECHNIQUE: Multidetector CT imaging  of the chest was performed following the standard protocol without IV contrast. COMPARISON:  01/02/2020 FINDINGS: Cardiovascular: Scattered aortic atherosclerosis. Normal heart size. Three-vessel coronary artery calcifications no pericardial effusion. Mediastinum/Nodes: Unchanged prominent AP window lymph node measuring up to 1.5 x 0.8 cm (series 2, image 41). Thyroid gland, trachea, and esophagus demonstrate no significant findings. Lungs/Pleura: Unchanged post treatment fibrotic scarring and volume loss of the suprahilar and paramedian left upper lobe. Minimal scarring of the bilateral lung bases with tubular bronchiectasis of the right lower lobe. Occasional stable, benign small pulmonary nodules. No pleural effusion or pneumothorax. Upper Abdomen: No acute abnormality. Musculoskeletal: No chest wall  mass or suspicious bone lesions identified. IMPRESSION: 1. Unchanged post treatment fibrotic scarring and volume loss of the suprahilar and paramedian left upper lobe. No evidence of recurrent or metastatic disease in the chest. 2. Unchanged prominent AP window lymph node. Attention on follow-up. 3. Occasional stable, benign small pulmonary nodules. 4. Coronary artery disease.  Aortic Atherosclerosis (ICD10-I70.0). Electronically Signed   By: Eddie Candle M.D.   On: 05/02/2020 10:01     Assessment and Plan-  Continue entrectinib 489m daily    Oral Chemotherapy Side Effect/Intolerance:  -Diarrhea: having diarrhea a couple days a week but he mostly has regular bowel movements. He has not needed to use loperamide to manage the diarrhea but knows he can if needed -Edema: reported slight edema, he elevates his feet whenever possible  Oral Chemotherapy Adherence: one missed dose this month  Medication Access Issues: patient on manufacturer assistance for his entrectinib, [place] Patient reports no issue with getting his medication from [facility] and has medication on hand.  Other: -Patient recently started on gabapentin and has noticed it has made him a bit more tired than normal. Before starting the gabapentin he  reports a good energy level and walking "around the block" but it has been a while due to the heat -no DDI between gabapentin and entrectinib  Patient expressed understanding and was in agreement with this plan. He also understands that He can call clinic at any time with any questions, concerns, or complaints.   Thank you for allowing me to participate in the care of this very pleasant patient.   Time Total: 10 mins  Visit consisted of counseling and education on dealing with issues of symptom management in the setting of serious and potentially life-threatening illness.Greater than 50%  of this time was spent counseling and coordinating care related to the above  assessment and plan.  Signed by: ADarl Pikes PharmD, BCPS, BSalley Slaughter CPP Hematology/Oncology Clinical Pharmacist Practitioner [phone] 05/06/2020 1:46 PM

## 2020-05-07 ENCOUNTER — Inpatient Hospital Stay: Payer: Medicare Other | Admitting: Hospice and Palliative Medicine

## 2020-05-17 ENCOUNTER — Other Ambulatory Visit: Payer: Self-pay

## 2020-05-17 DIAGNOSIS — E7800 Pure hypercholesterolemia, unspecified: Secondary | ICD-10-CM

## 2020-05-19 MED ORDER — ATORVASTATIN CALCIUM 40 MG PO TABS: 40.0000 mg | ORAL_TABLET | ORAL | 1 refills | Status: DC

## 2020-05-22 LAB — HM DIABETES EYE EXAM

## 2020-05-28 ENCOUNTER — Inpatient Hospital Stay: Payer: Medicare Other | Attending: Hospice and Palliative Medicine | Admitting: Hospice and Palliative Medicine

## 2020-05-28 ENCOUNTER — Other Ambulatory Visit: Payer: Self-pay

## 2020-05-28 DIAGNOSIS — Z8042 Family history of malignant neoplasm of prostate: Secondary | ICD-10-CM | POA: Insufficient documentation

## 2020-05-28 DIAGNOSIS — Z515 Encounter for palliative care: Secondary | ICD-10-CM

## 2020-05-28 DIAGNOSIS — H538 Other visual disturbances: Secondary | ICD-10-CM | POA: Insufficient documentation

## 2020-05-28 DIAGNOSIS — E1122 Type 2 diabetes mellitus with diabetic chronic kidney disease: Secondary | ICD-10-CM | POA: Insufficient documentation

## 2020-05-28 DIAGNOSIS — Z8719 Personal history of other diseases of the digestive system: Secondary | ICD-10-CM | POA: Insufficient documentation

## 2020-05-28 DIAGNOSIS — E785 Hyperlipidemia, unspecified: Secondary | ICD-10-CM | POA: Insufficient documentation

## 2020-05-28 DIAGNOSIS — C7931 Secondary malignant neoplasm of brain: Secondary | ICD-10-CM | POA: Insufficient documentation

## 2020-05-28 DIAGNOSIS — Z86711 Personal history of pulmonary embolism: Secondary | ICD-10-CM | POA: Insufficient documentation

## 2020-05-28 DIAGNOSIS — I82402 Acute embolism and thrombosis of unspecified deep veins of left lower extremity: Secondary | ICD-10-CM | POA: Insufficient documentation

## 2020-05-28 DIAGNOSIS — Z8673 Personal history of transient ischemic attack (TIA), and cerebral infarction without residual deficits: Secondary | ICD-10-CM | POA: Insufficient documentation

## 2020-05-28 DIAGNOSIS — C7951 Secondary malignant neoplasm of bone: Secondary | ICD-10-CM | POA: Insufficient documentation

## 2020-05-28 DIAGNOSIS — R531 Weakness: Secondary | ICD-10-CM | POA: Insufficient documentation

## 2020-05-28 DIAGNOSIS — R42 Dizziness and giddiness: Secondary | ICD-10-CM | POA: Insufficient documentation

## 2020-05-28 DIAGNOSIS — C3412 Malignant neoplasm of upper lobe, left bronchus or lung: Secondary | ICD-10-CM | POA: Insufficient documentation

## 2020-05-28 DIAGNOSIS — Z7901 Long term (current) use of anticoagulants: Secondary | ICD-10-CM | POA: Insufficient documentation

## 2020-05-28 DIAGNOSIS — G629 Polyneuropathy, unspecified: Secondary | ICD-10-CM | POA: Insufficient documentation

## 2020-05-28 DIAGNOSIS — Z8249 Family history of ischemic heart disease and other diseases of the circulatory system: Secondary | ICD-10-CM | POA: Insufficient documentation

## 2020-05-28 DIAGNOSIS — Z79899 Other long term (current) drug therapy: Secondary | ICD-10-CM | POA: Insufficient documentation

## 2020-05-28 DIAGNOSIS — Z833 Family history of diabetes mellitus: Secondary | ICD-10-CM | POA: Insufficient documentation

## 2020-05-28 DIAGNOSIS — R5383 Other fatigue: Secondary | ICD-10-CM | POA: Insufficient documentation

## 2020-05-28 DIAGNOSIS — N183 Chronic kidney disease, stage 3 unspecified: Secondary | ICD-10-CM | POA: Insufficient documentation

## 2020-05-28 DIAGNOSIS — Z7984 Long term (current) use of oral hypoglycemic drugs: Secondary | ICD-10-CM | POA: Insufficient documentation

## 2020-05-28 DIAGNOSIS — M2550 Pain in unspecified joint: Secondary | ICD-10-CM | POA: Insufficient documentation

## 2020-05-28 DIAGNOSIS — Z818 Family history of other mental and behavioral disorders: Secondary | ICD-10-CM | POA: Insufficient documentation

## 2020-05-28 DIAGNOSIS — Z86010 Personal history of colonic polyps: Secondary | ICD-10-CM | POA: Insufficient documentation

## 2020-05-28 DIAGNOSIS — R972 Elevated prostate specific antigen [PSA]: Secondary | ICD-10-CM | POA: Insufficient documentation

## 2020-05-28 NOTE — Progress Notes (Signed)
Was unable to reach patient for MyChart visit.  I called him but was unable to leave a voicemail due to full mailbox.  Will reschedule.

## 2020-06-03 ENCOUNTER — Inpatient Hospital Stay: Payer: Medicare Other

## 2020-06-03 ENCOUNTER — Other Ambulatory Visit: Payer: Self-pay

## 2020-06-03 ENCOUNTER — Encounter: Payer: Self-pay | Admitting: Internal Medicine

## 2020-06-03 ENCOUNTER — Inpatient Hospital Stay (HOSPITAL_BASED_OUTPATIENT_CLINIC_OR_DEPARTMENT_OTHER): Payer: Medicare Other | Admitting: Internal Medicine

## 2020-06-03 VITALS — BP 132/81 | HR 88 | Temp 97.9°F | Resp 16 | Ht 64.0 in | Wt 170.0 lb

## 2020-06-03 DIAGNOSIS — C7931 Secondary malignant neoplasm of brain: Secondary | ICD-10-CM

## 2020-06-03 DIAGNOSIS — Z8042 Family history of malignant neoplasm of prostate: Secondary | ICD-10-CM | POA: Diagnosis not present

## 2020-06-03 DIAGNOSIS — R42 Dizziness and giddiness: Secondary | ICD-10-CM | POA: Diagnosis not present

## 2020-06-03 DIAGNOSIS — C3412 Malignant neoplasm of upper lobe, left bronchus or lung: Secondary | ICD-10-CM

## 2020-06-03 DIAGNOSIS — R531 Weakness: Secondary | ICD-10-CM | POA: Diagnosis not present

## 2020-06-03 DIAGNOSIS — N183 Chronic kidney disease, stage 3 unspecified: Secondary | ICD-10-CM | POA: Diagnosis not present

## 2020-06-03 DIAGNOSIS — Z7901 Long term (current) use of anticoagulants: Secondary | ICD-10-CM | POA: Diagnosis not present

## 2020-06-03 DIAGNOSIS — Z8249 Family history of ischemic heart disease and other diseases of the circulatory system: Secondary | ICD-10-CM | POA: Diagnosis not present

## 2020-06-03 DIAGNOSIS — Z7984 Long term (current) use of oral hypoglycemic drugs: Secondary | ICD-10-CM | POA: Diagnosis not present

## 2020-06-03 DIAGNOSIS — M2550 Pain in unspecified joint: Secondary | ICD-10-CM | POA: Diagnosis not present

## 2020-06-03 DIAGNOSIS — Z8673 Personal history of transient ischemic attack (TIA), and cerebral infarction without residual deficits: Secondary | ICD-10-CM | POA: Diagnosis not present

## 2020-06-03 DIAGNOSIS — Z8719 Personal history of other diseases of the digestive system: Secondary | ICD-10-CM | POA: Diagnosis not present

## 2020-06-03 DIAGNOSIS — I82402 Acute embolism and thrombosis of unspecified deep veins of left lower extremity: Secondary | ICD-10-CM | POA: Diagnosis not present

## 2020-06-03 DIAGNOSIS — R972 Elevated prostate specific antigen [PSA]: Secondary | ICD-10-CM | POA: Diagnosis not present

## 2020-06-03 DIAGNOSIS — E1122 Type 2 diabetes mellitus with diabetic chronic kidney disease: Secondary | ICD-10-CM | POA: Diagnosis not present

## 2020-06-03 DIAGNOSIS — H538 Other visual disturbances: Secondary | ICD-10-CM | POA: Diagnosis not present

## 2020-06-03 DIAGNOSIS — E785 Hyperlipidemia, unspecified: Secondary | ICD-10-CM | POA: Diagnosis not present

## 2020-06-03 DIAGNOSIS — Z833 Family history of diabetes mellitus: Secondary | ICD-10-CM | POA: Diagnosis not present

## 2020-06-03 DIAGNOSIS — Z86010 Personal history of colonic polyps: Secondary | ICD-10-CM | POA: Diagnosis not present

## 2020-06-03 DIAGNOSIS — C7951 Secondary malignant neoplasm of bone: Secondary | ICD-10-CM | POA: Diagnosis not present

## 2020-06-03 DIAGNOSIS — R5383 Other fatigue: Secondary | ICD-10-CM | POA: Diagnosis not present

## 2020-06-03 DIAGNOSIS — Z79899 Other long term (current) drug therapy: Secondary | ICD-10-CM | POA: Diagnosis not present

## 2020-06-03 DIAGNOSIS — G629 Polyneuropathy, unspecified: Secondary | ICD-10-CM | POA: Diagnosis not present

## 2020-06-03 DIAGNOSIS — Z86711 Personal history of pulmonary embolism: Secondary | ICD-10-CM | POA: Diagnosis not present

## 2020-06-03 LAB — CBC WITH DIFFERENTIAL/PLATELET
Abs Immature Granulocytes: 0.01 10*3/uL (ref 0.00–0.07)
Basophils Absolute: 0 10*3/uL (ref 0.0–0.1)
Basophils Relative: 1 %
Eosinophils Absolute: 0.2 10*3/uL (ref 0.0–0.5)
Eosinophils Relative: 4 %
HCT: 41.2 % (ref 39.0–52.0)
Hemoglobin: 14.2 g/dL (ref 13.0–17.0)
Immature Granulocytes: 0 %
Lymphocytes Relative: 41 %
Lymphs Abs: 2.5 10*3/uL (ref 0.7–4.0)
MCH: 30.3 pg (ref 26.0–34.0)
MCHC: 34.5 g/dL (ref 30.0–36.0)
MCV: 87.8 fL (ref 80.0–100.0)
Monocytes Absolute: 0.8 10*3/uL (ref 0.1–1.0)
Monocytes Relative: 13 %
Neutro Abs: 2.5 10*3/uL (ref 1.7–7.7)
Neutrophils Relative %: 41 %
Platelets: 218 10*3/uL (ref 150–400)
RBC: 4.69 MIL/uL (ref 4.22–5.81)
RDW: 14.6 % (ref 11.5–15.5)
WBC: 6 10*3/uL (ref 4.0–10.5)
nRBC: 0 % (ref 0.0–0.2)

## 2020-06-03 LAB — COMPREHENSIVE METABOLIC PANEL WITH GFR
ALT: 27 U/L (ref 0–44)
AST: 21 U/L (ref 15–41)
Albumin: 4.1 g/dL (ref 3.5–5.0)
Alkaline Phosphatase: 48 U/L (ref 38–126)
Anion gap: 10 (ref 5–15)
BUN: 24 mg/dL — ABNORMAL HIGH (ref 8–23)
CO2: 22 mmol/L (ref 22–32)
Calcium: 9 mg/dL (ref 8.9–10.3)
Chloride: 109 mmol/L (ref 98–111)
Creatinine, Ser: 1.79 mg/dL — ABNORMAL HIGH (ref 0.61–1.24)
GFR calc Af Amer: 45 mL/min — ABNORMAL LOW
GFR calc non Af Amer: 39 mL/min — ABNORMAL LOW
Glucose, Bld: 187 mg/dL — ABNORMAL HIGH (ref 70–99)
Potassium: 4.1 mmol/L (ref 3.5–5.1)
Sodium: 141 mmol/L (ref 135–145)
Total Bilirubin: 0.7 mg/dL (ref 0.3–1.2)
Total Protein: 7.4 g/dL (ref 6.5–8.1)

## 2020-06-03 MED ORDER — MECLIZINE HCL 25 MG PO TABS: 25.0000 mg | ORAL_TABLET | Freq: Three times a day (TID) | ORAL | 0 refills | Status: DC | PRN

## 2020-06-03 NOTE — Progress Notes (Signed)
Pt states he is feeling dizzy and weakness in his legs today.

## 2020-06-03 NOTE — Progress Notes (Signed)
[facility] OFFICE PROGRESS NOTE  Patient Care Team: [name], [name], MD as Medical Oncologist (Medical Oncology) Cathi Roan, [facility] (Pharmacist)  Cancer Staging No matching staging information was found for the patient.   Oncology History Overview Note  # OCT 2017- ADENO CA LUNG; STAGE IV [; LUL; bil supraclavicular LN; Left neck LN Bx]; ROS-1 MUTATED; s/p Carbo-alimta x1[oct 2017]  # NOV 1st 2017- XALKORI 250 mg BID; JAN 15th CT- PR;  # AUG 27th Chemo-RT to persistent LUL/mediastinal LN [s/p carbo-taxol- with RT; finished Sep 22nd 2018];   # July 14th 2020- multiple metastatic lesions of the brain [July 30 whole brain radiation finished April 21, 2019]; bone scan skull metastases /posterior left ninth rib metastases; April 04, 2019 stopped crizotinib;  #April 25, 2019-start Entrectinib 600 mg once a day; STopped in NOV 2020 [dizziness]; NOV 2020-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   # LLE DVT/bil PE/Multiple strokes [? On xarelto]-Lovenox; Jan mid 2018- xarelto [lovenox-insurance issues]  # MRI brain- multiple infarcts [2d echo/bubble study-NEG's/p Neurology eval]  ------------------------------------------------------------    # # s/p TURP [Sep 2017; Dr.Cope] DEC 26th CT- distended bladder;  # Elevated PSA-  FEB 2021- 12;  S/p Bx- Negative; follow with urology/Dr.Stoioff   # MOLECULAR TESTING- ROS-1 POSITIVE; ALK/EGFR-NEG; PDL-1 EXPRESSION- 90%** [HIGH]  # DEC 15th 2020- PALLIATIVE CARE -----------------------------------------------------------------    DIAGNOSIS: Adenocarcinoma the lung  ROS-1 +  STAGE:  IV    ;GOALS: Palliative  CURRENT/MOST RECENT  THERAPY- Avastin [C]   Primary cancer of left upper lobe of lung ([place])  08/09/2019 -  Chemotherapy   The patient had bevacizumab-awwb (MVASI) 800 mg in sodium chloride 0.9 % 100 mL chemo infusion, 725 mg, Intravenous,  Once, 6 of 6 cycles Dose modification: 10 mg/kg (original dose 10 mg/kg, Cycle 3, Reason: Other (see comments), Comment: weight change) Administration: 800 mg (08/09/2019), 800 mg (08/22/2019), 700 mg (09/19/2019), 700 mg (10/04/2019), 700 mg (10/18/2019), 700 mg (11/01/2019)  for chemotherapy treatment.        INTERVAL HISTORY:  Melvin Willis 69 y.o.  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  In the interim evaluated by ophthalmology-blurry vision improved.  Patient complains of dizzy spells especially with the movement of his head.  No falls.  However unsteady during the dizzy spells.   Complains of mild weakness in his left lower extremity.Denies any headaches.  Patient is taking his gabapentin with improvement of his pain in his legs.   Review of Systems  Constitutional: Positive for malaise/fatigue. Negative for chills, diaphoresis, fever and weight loss.  HENT: Negative for nosebleeds and sore throat.   Eyes: Negative for double vision.  Respiratory: Negative for cough, hemoptysis, sputum production, shortness of breath and wheezing.   Cardiovascular: Negative for chest pain (Left upper chest wall pain chronic), palpitations and orthopnea.  Gastrointestinal: Negative for abdominal pain, blood in stool, constipation, diarrhea, heartburn and melena.  Genitourinary: Negative for dysuria.  Musculoskeletal: Positive for joint pain. Negative for back pain.  Skin: Negative.  Negative for itching and rash.  Neurological: Positive for dizziness, tingling and sensory change. Negative for focal weakness and weakness.  Endo/Heme/Allergies: Does  not bruise/bleed easily.   Psychiatric/Behavioral: Positive for memory loss. Negative for depression. The patient is not nervous/anxious and does not have insomnia.     PAST MEDICAL HISTORY :  Past Medical History:  Diagnosis Date  . Abnormal prostate specific antigen 08/08/2012  . Adiposity 04/16/2015  . Chronic kidney disease (CKD), stage III (moderate) 11/27/2016  . CVA (cerebral vascular accident) ([place]) 06/17/2016  . Diabetes mellitus without complication ([place])   . Diverticulosis of sigmoid colon 04/16/2015  . Dyslipidemia 03/18/2015  . Hemorrhoids, internal 04/16/2015  . Hypercholesteremia 04/16/2015  . Hyperlipidemia   . Hypertension   . Primary cancer of left upper lobe of lung ([place])   . Pulmonary embolism ([place])   . Wears dentures    partial upper    PAST SURGICAL HISTORY :   Past Surgical History:  Procedure Laterality Date  . COLONOSCOPY    . COLONOSCOPY WITH PROPOFOL N/A 05/06/2015   Procedure: COLONOSCOPY WITH PROPOFOL;  Surgeon: Lucilla Lame, MD;  Location: [facility];  Service: Endoscopy;  Laterality: N/A;  ASCENDING COLON POLYPS X 2 TERMINAL ILEUM BIOPSY RANDOM COLON BX. TRANSVERSE COLON POLYP SIGMOID COLON POLYP  . ESOPHAGOGASTRODUODENOSCOPY (EGD) WITH PROPOFOL N/A 05/06/2015   Procedure: ESOPHAGOGASTRODUODENOSCOPY (EGD) WITH PROPOFOL;  Surgeon: Lucilla Lame, MD;  Location: [facility];  Service: Endoscopy;  Laterality: N/A;  GASTRIC BIOPSY X1  . KIDNEY STONE SURGERY  2017    FAMILY HISTORY :   Family History  Problem Relation Age of Onset  . Diabetes Mother   . Diabetes Father   . CAD Father   . Dementia Father   . Diabetes Sister   . Cancer Maternal Uncle        Prostate  . Cancer Cousin        prostate    SOCIAL HISTORY:   Social History   Tobacco Use  . Smoking status: Never Smoker  . Smokeless tobacco: Never Used  Vaping Use  . Vaping Use: Never used  Substance Use Topics  . Alcohol use: No    Alcohol/week: 0.0 standard drinks  . Drug use: No     ALLERGIES:  has No Known Allergies.  MEDICATIONS:  Current Outpatient Medications  Medication Sig Dispense Refill  . atorvastatin (LIPITOR) 40 MG tablet Take 1 tablet (40 mg total) by mouth 3 (three) times a week. 48 tablet 1  . blood glucose meter kit and supplies Dispense based on patient and insurance preference (Accuchek Aviva Plus, Accuchek Nano). Use up to four times daily as directed. (FOR ICD-10 E10.9, E11.9). 1 each 0  . entrectinib (ROZLYTREK) 200 MG capsule Take 3 capsules (600 mg total) by mouth daily. 90 capsule 6  . metFORMIN (GLUCOPHAGE) 500 MG tablet TAKE 1 TABLET BY MOUTH TWICE DAILY WITH A MEAL 180 tablet 1  . ondansetron (ZOFRAN) 8 MG tablet One pill every 8 hours as needed for nausea/vomitting. 40 tablet 1  . tamsulosin (FLOMAX) 0.4 MG CAPS capsule Take 1 capsule by mouth once daily in the morning 90 capsule 3  . XARELTO 20 MG TABS tablet TAKE 1 TABLET BY MOUTH ONCE DAILY WITH SUPPER 30 tablet 6  . gabapentin (NEURONTIN) 300 MG capsule Take 1 capsule (300 mg total) by mouth at bedtime. (Patient not taking: Reported on 06/03/2020) 30 capsule 3  . meclizine (ANTIVERT) 25 MG tablet Take 1 tablet (25 mg total) by mouth 3 (three) times daily as needed for dizziness. 45 tablet 0   No current facility-administered medications for this visit.  PHYSICAL EXAMINATION: ECOG PERFORMANCE STATUS: 0 - Asymptomatic  BP 132/81 (BP Location: Right Arm, Patient Position: Sitting, Cuff Size: Normal)   Pulse 88   Temp 97.9 F (36.6 C)   Resp 16   Ht '5\' 4"'  (1.626 m)   Wt 170 lb (77.1 kg)   SpO2 97%   BMI 29.18 kg/m   Filed Weights   06/03/20 1004  Weight: 170 lb (77.1 kg)    Physical Exam Constitutional:      Comments: Patient is alone.  He is walking by himself.  HENT:     Head: Normocephalic and atraumatic.     Ears:     Comments: Bilateral cerumen impaction.    Mouth/Throat:     Pharynx: No oropharyngeal exudate.  Eyes:     Pupils: Pupils are equal, round, and  reactive to light.  Cardiovascular:     Rate and Rhythm: Normal rate and regular rhythm.  Pulmonary:     Effort: No respiratory distress.     Breath sounds: No wheezing.     Comments: Decreased breath sounds bilaterally no wheeze or crackles. Abdominal:     General: Bowel sounds are normal. There is no distension.     Palpations: Abdomen is soft. There is no mass.     Tenderness: There is no abdominal tenderness. There is no guarding or rebound.  Musculoskeletal:        General: No tenderness. Normal range of motion.     Cervical back: Normal range of motion and neck supple.  Skin:    General: Skin is warm.  Neurological:     Mental Status: He is alert and oriented to person, place, and time.  Psychiatric:        Mood and Affect: Affect normal.    LABORATORY DATA:  I have reviewed the data as listed    Component Value Date/Time   NA 141 06/03/2020 0951   NA 139 01/01/2016 1000   K 4.1 06/03/2020 0951   CL 109 06/03/2020 0951   CO2 22 06/03/2020 0951   GLUCOSE 187 (H) 06/03/2020 0951   BUN 24 (H) 06/03/2020 0951   BUN 13 01/01/2016 1000   CREATININE 1.79 (H) 06/03/2020 0951   CREATININE 1.32 (H) 08/22/2018 1107   CALCIUM 9.0 06/03/2020 0951   PROT 7.4 06/03/2020 0951   PROT 7.2 01/01/2016 1000   ALBUMIN 4.1 06/03/2020 0951   ALBUMIN 4.4 01/01/2016 1000   AST 21 06/03/2020 0951   ALT 27 06/03/2020 0951   ALKPHOS 48 06/03/2020 0951   BILITOT 0.7 06/03/2020 0951   BILITOT 0.4 01/01/2016 1000   GFRNONAA 39 (L) 06/03/2020 0951   GFRNONAA 56 (L) 08/22/2018 1107   GFRAA 45 (L) 06/03/2020 0951   GFRAA 65 08/22/2018 1107    No results found for: SPEP, UPEP  Lab Results  Component Value Date   WBC 6.0 06/03/2020   NEUTROABS 2.5 06/03/2020   HGB 14.2 06/03/2020   HCT 41.2 06/03/2020   MCV 87.8 06/03/2020   PLT 218 06/03/2020      Chemistry      Component Value Date/Time   NA 141 06/03/2020 0951   NA 139 01/01/2016 1000   K 4.1 06/03/2020 0951   CL 109  06/03/2020 0951   CO2 22 06/03/2020 0951   BUN 24 (H) 06/03/2020 0951   BUN 13 01/01/2016 1000   CREATININE 1.79 (H) 06/03/2020 0951   CREATININE 1.32 (H) 08/22/2018 1107      Component Value Date/Time  CALCIUM 9.0 06/03/2020 0951   ALKPHOS 48 06/03/2020 0951   AST 21 06/03/2020 0951   ALT 27 06/03/2020 0951   BILITOT 0.7 06/03/2020 0951   BILITOT 0.4 01/01/2016 1000       RADIOGRAPHIC STUDIES: I have personally reviewed the radiological images as listed and agreed with the findings in the report. No results found.   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   # Continue 2 pills Rozyltek [CKD-StageIII]; continue 2 pills a day.  # Dizziness- no orthostasis/ Left LE weakness- subjective; recommend MRI brain ASAP. Start antivert; if not improved recommend repeat ENT evaluation.   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   # CKD stage III-creatinine 1.7 [GFR~39]-STABLE  # Peripheral Neuropathy- ? DM;continue  gabapentin 200 mg qhsSTABLE  # DISPOSITION:will call  # MRI Brain ASAP # follow up in 4 weeks MD;labs- cbc/cmp; --Dr.B        Orders Placed This Encounter  Procedures  . MR Brain W Wo Contrast    Standing Status:   Future    Standing Expiration Date:   06/03/2021    Order Specific Question:   If indicated for the ordered procedure, I authorize the administration of contrast media per Radiology protocol    Answer:   Yes    Order Specific Question:   What is the patient's sedation requirement?    Answer:   No Sedation    Order Specific Question:   Does the patient have a pacemaker or implanted devices?    Answer:   No    Order Specific Question:   Use SRS Protocol?    Answer:   No    Order Specific Question:   Radiology Contrast  Protocol - do NOT remove file path    Answer:   [email or web address]    Order Specific Question:   Preferred imaging location?    Answer:   [facility] (table limit - 550lbs)   All questions were answered. The patient knows to call the clinic with any problems, questions or concerns.      Cammie Sickle, MD 06/03/2020 11:05 AM

## 2020-06-03 NOTE — Assessment & Plan Note (Signed)
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   # Continue 2 pills Rozyltek [CKD-StageIII]; continue 2 pills a day.  # Dizziness- no orthostasis/ Left LE weakness- subjective; recommend MRI brain ASAP. Start antivert; if not improved recommend repeat ENT evaluation.   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   # CKD stage III-creatinine 1.7 [GFR~39]-STABLE  # Peripheral Neuropathy- ? DM;continue  gabapentin 200 mg qhsSTABLE  # DISPOSITION:will call  # MRI Brain ASAP # follow up in 4 weeks MD;labs- cbc/cmp; --Dr.B

## 2020-06-04 ENCOUNTER — Other Ambulatory Visit: Payer: Self-pay

## 2020-06-04 ENCOUNTER — Ambulatory Visit
Admission: RE | Admit: 2020-06-04 | Discharge: 2020-06-04 | Disposition: A | Discharge status: Home / Self Care | Payer: Medicare Other | Source: Ambulatory Visit | Attending: Internal Medicine | Admitting: Internal Medicine

## 2020-06-04 DIAGNOSIS — R42 Dizziness and giddiness: Secondary | ICD-10-CM | POA: Diagnosis not present

## 2020-06-04 DIAGNOSIS — C7931 Secondary malignant neoplasm of brain: Secondary | ICD-10-CM

## 2020-06-04 DIAGNOSIS — G9389 Other specified disorders of brain: Secondary | ICD-10-CM | POA: Diagnosis not present

## 2020-06-04 DIAGNOSIS — C3412 Malignant neoplasm of upper lobe, left bronchus or lung: Secondary | ICD-10-CM | POA: Diagnosis not present

## 2020-06-04 DIAGNOSIS — H748X3 Other specified disorders of middle ear and mastoid, bilateral: Secondary | ICD-10-CM | POA: Diagnosis not present

## 2020-06-04 MED ORDER — GADOBUTROL 1 MMOL/ML IV SOLN
7.0000 mL | Freq: Once | INTRAVENOUS | Status: AC | PRN
  Administered 2020-06-04: 7 mL via INTRAVENOUS

## 2020-06-05 ENCOUNTER — Telehealth: Payer: Self-pay | Admitting: Internal Medicine

## 2020-06-05 DIAGNOSIS — R42 Dizziness and giddiness: Secondary | ICD-10-CM

## 2020-06-05 DIAGNOSIS — H9190 Unspecified hearing loss, unspecified ear: Secondary | ICD-10-CM

## 2020-06-05 NOTE — Telephone Encounter (Signed)
On 9/28 -spoke to pt results of the brain MRI negative for any worsening malignancy.  Stable metastatic disease in brain.  Cannot explain his dizziness.  Recommend that he starts taking his Antivert.  Also recommend follow-up with ENT.

## 2020-06-06 NOTE — Telephone Encounter (Signed)
Referral placed to ENT Dr. Lucia Gaskins.

## 2020-06-06 NOTE — Addendum Note (Signed)
Addended by: Delice Bison E on: 06/06/2020 11:27 AM   Modules accepted: Orders

## 2020-06-13 ENCOUNTER — Other Ambulatory Visit: Payer: Self-pay

## 2020-06-13 ENCOUNTER — Ambulatory Visit (INDEPENDENT_AMBULATORY_CARE_PROVIDER_SITE_OTHER): Payer: Medicare Other | Admitting: Urology

## 2020-06-13 ENCOUNTER — Encounter: Payer: Self-pay | Admitting: Urology

## 2020-06-13 VITALS — BP 135/88 | HR 88 | Ht 64.0 in | Wt 169.0 lb

## 2020-06-13 DIAGNOSIS — N138 Other obstructive and reflux uropathy: Secondary | ICD-10-CM | POA: Diagnosis not present

## 2020-06-13 DIAGNOSIS — N401 Enlarged prostate with lower urinary tract symptoms: Secondary | ICD-10-CM

## 2020-06-13 DIAGNOSIS — Z125 Encounter for screening for malignant neoplasm of prostate: Secondary | ICD-10-CM | POA: Diagnosis not present

## 2020-06-13 LAB — BLADDER SCAN AMB NON-IMAGING: Scan Result: 115

## 2020-06-13 MED ORDER — FINASTERIDE 5 MG PO TABS
5.0000 mg | ORAL_TABLET | Freq: Every day | ORAL | 3 refills | Status: DC
Start: 2020-06-13 — End: 2021-01-10

## 2020-06-13 NOTE — Progress Notes (Signed)
   06/13/2020 8:49 AM   Letitia Libra Maryan Char Dec 07, 1950 [id number]  Reason for visit: BPH and urinary symptoms, PSA screening  HPI: I saw Mr. Stancil today in urology clinic for follow-up of BPH.  He is a complex 69 year old male with history of metastatic lung cancer to the brain who had a rising PSA to 12.2 in February 2021 from 6.7 in June 2020 and oncology recommended further evaluation with a biopsy.  He was previously followed by Dr. Bernardo Heater.  A prostate MRI showed a 170 g gland with a PI-RADS 4 lesion at the left posterior lateral mid gland peripheral zone.  He underwent a cognitive biopsy with me in March 2021 which showed a 174 g gland with a large median lobe, PSA density of 0.07, and pathology showed only benign prostate tissue.  His primary urinary complaints today are urination every 3 hours during the day with weak stream.  He denies any significant nocturia.  IPSS score today is 15, with quality of life unhappy.  PVR is normal at 115 mL.  I do long conversation with the patient about his complex situation with history of metastatic lung cancer to the brain as well as bothersome BPH and urinary symptoms.  We discussed treatment options at length including addition of finasteride, versus pursuing an outlet procedure with a HOLEP. We discussed the risks and benefits of HoLEP at length.  The procedure requires general anesthesia and takes 2 to 3 hours, and a holmium laser is used to enucleate the prostate and push this tissue into the bladder.  A morcellator is then used to remove this tissue, which is sent for pathology.  The vast majority of patients are able to discharge the same day with a catheter in place for 2 to 3 days, and will follow-up in clinic for a voiding trial.  Approximately 5% of patients will be admitted overnight to monitor the urine, or if they have multiple co-morbidities.  We specifically discussed the risks of bleeding, infection, retrograde ejaculation, temporary  urgency and urge incontinence, very low risk of long-term incontinence, pathologic evaluation of prostate tissue and possible detection of prostate cancer or other malignancy, and possible need for additional procedures.  I think with his comorbidities and metastatic cancer I think it is reasonable to add finasteride and see if his urinary symptoms improve without surgery, however if he has worsening urinary symptoms, episodes of retention or gross hematuria or UTI, I would recommend pursuing a HOLEP, at least removing his very large median lobe which would likely improve his urinary symptoms.  Finally, with his comorbidities and metastatic lung cancer to the brain, I would not recommend continued PSA screening with his history of negative biopsy.  Start finasteride, risks and benefits discussed, continue Flomax RTC 6 months for IPSS, PVR, symptom check, re-discuss [place], MD  [facility] [address], [place], [place] [address] [phone]

## 2020-06-13 NOTE — Patient Instructions (Signed)

## 2020-06-19 ENCOUNTER — Ambulatory Visit (INDEPENDENT_AMBULATORY_CARE_PROVIDER_SITE_OTHER): Payer: Medicare Other | Admitting: Otolaryngology

## 2020-06-19 ENCOUNTER — Encounter (INDEPENDENT_AMBULATORY_CARE_PROVIDER_SITE_OTHER): Payer: Self-pay | Admitting: Otolaryngology

## 2020-06-19 ENCOUNTER — Other Ambulatory Visit: Payer: Self-pay

## 2020-06-19 VITALS — Temp 97.0°F

## 2020-06-19 DIAGNOSIS — H6123 Impacted cerumen, bilateral: Secondary | ICD-10-CM | POA: Diagnosis not present

## 2020-06-19 NOTE — Progress Notes (Signed)
HPI: Melvin Willis is a 69 y.o. male who presents for evaluation of his ears. He was recently seen by his PCP who told him he had wax and possible fluid in his ears. He has not had any hearing problems and no ear pain..  Past Medical History:  Diagnosis Date  . Abnormal prostate specific antigen 08/08/2012  . Adiposity 04/16/2015  . Chronic kidney disease (CKD), stage III (moderate) ([place]) 11/27/2016  . CVA (cerebral vascular accident) ([place]) 06/17/2016  . Diabetes mellitus without complication ([place])   . Diverticulosis of sigmoid colon 04/16/2015  . Dyslipidemia 03/18/2015  . Hemorrhoids, internal 04/16/2015  . Hypercholesteremia 04/16/2015  . Hyperlipidemia   . Hypertension   . Primary cancer of left upper lobe of lung ([place])   . Pulmonary embolism ([place])   . Wears dentures    partial upper   Past Surgical History:  Procedure Laterality Date  . COLONOSCOPY    . COLONOSCOPY WITH PROPOFOL N/A 05/06/2015   Procedure: COLONOSCOPY WITH PROPOFOL;  Surgeon: Lucilla Lame, MD;  Location: [facility];  Service: Endoscopy;  Laterality: N/A;  ASCENDING COLON POLYPS X 2 TERMINAL ILEUM BIOPSY RANDOM COLON BX. TRANSVERSE COLON POLYP SIGMOID COLON POLYP  . ESOPHAGOGASTRODUODENOSCOPY (EGD) WITH PROPOFOL N/A 05/06/2015   Procedure: ESOPHAGOGASTRODUODENOSCOPY (EGD) WITH PROPOFOL;  Surgeon: Lucilla Lame, MD;  Location: [facility];  Service: Endoscopy;  Laterality: N/A;  GASTRIC BIOPSY X1  . KIDNEY STONE SURGERY  2017   Social History   Socioeconomic History  . Marital status: Married    Spouse name: Melissa   . Number of children: 3  . Years of education: Not on file  . Highest education level: Not on file  Occupational History  . Occupation: disable     Comment: from CVA and lung cancer  Tobacco Use  . Smoking status: Never Smoker  . Smokeless tobacco: Never Used  Vaping Use  . Vaping Use: Never used  Substance and Sexual Activity  . Alcohol use: No    Alcohol/week: 0.0 standard  drinks  . Drug use: No  . Sexual activity: Yes    Partners: Female  Other Topics Concern  . Not on file  Social History Narrative   Used to work until 2 years ago when got sick with DVT leg , PE , CVA and found out he had lung cancer. He has medicare now    Social Determinants of Radio broadcast assistant Strain: Low Risk   . Difficulty of Paying Living Expenses: Not very hard  Food Insecurity: No Food Insecurity  . Worried About Charity fundraiser in the Last Year: Never true  . Ran Out of Food in the Last Year: Never true  Transportation Needs: No Transportation Needs  . Lack of Transportation (Medical): No  . Lack of Transportation (Non-Medical): No  Physical Activity: Inactive  . Days of Exercise per Week: 0 days  . Minutes of Exercise per Session: 0 min  Stress: No Stress Concern Present  . Feeling of Stress : Not at all  Social Connections: Moderately Integrated  . Frequency of Communication with Friends and Family: More than three times a week  . Frequency of Social Gatherings with Friends and Family: Three times a week  . Attends Religious Services: More than 4 times per year  . Active Member of Clubs or Organizations: No  . Attends Archivist Meetings: Never  . Marital Status: Married   Family History  Problem Relation Age of Onset  .  Diabetes Mother   . Diabetes Father   . CAD Father   . Dementia Father   . Diabetes Sister   . Cancer Maternal Uncle        Prostate  . Cancer Cousin        prostate   No Known Allergies Prior to Admission medications   Medication Sig Start Date End Date Taking? Authorizing Provider  atorvastatin (LIPITOR) 40 MG tablet Take 1 tablet (40 mg total) by mouth 3 (three) times a week. 05/20/20  Yes [name], MD  blood glucose meter kit and supplies Dispense based on patient and insurance preference (Accuchek Aviva Plus, Accuchek Nano). Use up to four times daily as directed. (FOR ICD-10 E10.9, E11.9). 05/08/19  Yes  Sowles, Drue Stager, MD  entrectinib (ROZLYTREK) 200 MG capsule Take 3 capsules (600 mg total) by mouth daily. 11/16/19  Yes Cammie Sickle, MD  finasteride (PROSCAR) 5 MG tablet Take 1 tablet (5 mg total) by mouth daily. 06/13/20  Yes Billey Co, MD  meclizine (ANTIVERT) 25 MG tablet Take 1 tablet (25 mg total) by mouth 3 (three) times daily as needed for dizziness. 06/03/20  Yes Cammie Sickle, MD  metFORMIN (GLUCOPHAGE) 500 MG tablet TAKE 1 TABLET BY MOUTH TWICE DAILY WITH A MEAL 04/24/20  Yes Towanda Malkin, MD  ondansetron (ZOFRAN) 8 MG tablet One pill every 8 hours as needed for nausea/vomitting. 03/22/19  Yes Cammie Sickle, MD  tamsulosin (FLOMAX) 0.4 MG CAPS capsule Take 1 capsule by mouth once daily in the morning 12/11/19  Yes Stoioff, Ronda Fairly, MD  XARELTO 20 MG TABS tablet TAKE 1 TABLET BY MOUTH ONCE DAILY WITH SUPPER 12/14/19  Yes Cammie Sickle, MD     Positive ROS: Otherwise negative  All other systems have been reviewed and were otherwise negative with the exception of those mentioned in the HPI and as above.  Physical Exam: Constitutional: Alert, well-appearing, no acute distress Ears: External ears without lesions or tenderness. Ear canals are both obstructed with cerumen that was removed with curettes. TMs are otherwise clear with good mobility on pneumatic otoscopy.. Nasal: External nose without lesions. Clear nasal passages Oral: Oropharynx clear. Neck: No palpable adenopathy or masses Respiratory: Breathing comfortably  Skin: No facial/neck lesions or rash noted.  Cerumen impaction removal  Date/Time: 06/19/2020 9:41 AM Performed by: Rozetta Nunnery, MD Authorized by: Rozetta Nunnery, MD   Consent:    Consent obtained:  Verbal   Consent given by:  Patient   Risks discussed:  Pain and bleeding Procedure details:    Location:  L ear and R ear   Procedure type: curette   Post-procedure details:    Inspection:  TM  intact and canal normal   Hearing quality:  Improved   Patient tolerance of procedure:  Tolerated well, no immediate complications Comments:     TMs are clear bilaterally.    Assessment: Cerumen buildup bilaterally  Plan: TMs and ear canals are clear otherwise.  Radene Journey, MD

## 2020-06-25 ENCOUNTER — Inpatient Hospital Stay: Payer: Medicare Other | Attending: Hospice and Palliative Medicine | Admitting: Hospice and Palliative Medicine

## 2020-06-25 DIAGNOSIS — N183 Chronic kidney disease, stage 3 unspecified: Secondary | ICD-10-CM | POA: Insufficient documentation

## 2020-06-25 DIAGNOSIS — Z515 Encounter for palliative care: Secondary | ICD-10-CM | POA: Diagnosis not present

## 2020-06-25 DIAGNOSIS — C7951 Secondary malignant neoplasm of bone: Secondary | ICD-10-CM | POA: Insufficient documentation

## 2020-06-25 DIAGNOSIS — Z7901 Long term (current) use of anticoagulants: Secondary | ICD-10-CM | POA: Insufficient documentation

## 2020-06-25 DIAGNOSIS — E1122 Type 2 diabetes mellitus with diabetic chronic kidney disease: Secondary | ICD-10-CM | POA: Insufficient documentation

## 2020-06-25 DIAGNOSIS — Z8249 Family history of ischemic heart disease and other diseases of the circulatory system: Secondary | ICD-10-CM | POA: Insufficient documentation

## 2020-06-25 DIAGNOSIS — R5383 Other fatigue: Secondary | ICD-10-CM | POA: Insufficient documentation

## 2020-06-25 DIAGNOSIS — Z8673 Personal history of transient ischemic attack (TIA), and cerebral infarction without residual deficits: Secondary | ICD-10-CM | POA: Insufficient documentation

## 2020-06-25 DIAGNOSIS — C7931 Secondary malignant neoplasm of brain: Secondary | ICD-10-CM | POA: Insufficient documentation

## 2020-06-25 DIAGNOSIS — Z818 Family history of other mental and behavioral disorders: Secondary | ICD-10-CM | POA: Insufficient documentation

## 2020-06-25 DIAGNOSIS — R42 Dizziness and giddiness: Secondary | ICD-10-CM | POA: Insufficient documentation

## 2020-06-25 DIAGNOSIS — M2550 Pain in unspecified joint: Secondary | ICD-10-CM | POA: Insufficient documentation

## 2020-06-25 DIAGNOSIS — C3412 Malignant neoplasm of upper lobe, left bronchus or lung: Secondary | ICD-10-CM | POA: Diagnosis not present

## 2020-06-25 DIAGNOSIS — Z833 Family history of diabetes mellitus: Secondary | ICD-10-CM | POA: Insufficient documentation

## 2020-06-25 DIAGNOSIS — R200 Anesthesia of skin: Secondary | ICD-10-CM | POA: Insufficient documentation

## 2020-06-25 DIAGNOSIS — G629 Polyneuropathy, unspecified: Secondary | ICD-10-CM | POA: Insufficient documentation

## 2020-06-25 DIAGNOSIS — Z86711 Personal history of pulmonary embolism: Secondary | ICD-10-CM | POA: Insufficient documentation

## 2020-06-25 DIAGNOSIS — N400 Enlarged prostate without lower urinary tract symptoms: Secondary | ICD-10-CM | POA: Insufficient documentation

## 2020-06-25 DIAGNOSIS — Z79899 Other long term (current) drug therapy: Secondary | ICD-10-CM | POA: Insufficient documentation

## 2020-06-25 DIAGNOSIS — E785 Hyperlipidemia, unspecified: Secondary | ICD-10-CM | POA: Insufficient documentation

## 2020-06-25 DIAGNOSIS — R972 Elevated prostate specific antigen [PSA]: Secondary | ICD-10-CM | POA: Insufficient documentation

## 2020-06-25 DIAGNOSIS — Z8719 Personal history of other diseases of the digestive system: Secondary | ICD-10-CM | POA: Insufficient documentation

## 2020-06-25 NOTE — Progress Notes (Signed)
Virtual Visit via Telephone Note  I connected with Melvin Willis on 06/25/20 at 10:30 AM EDT by telephone and verified that I am speaking with the correct person using two identifiers.   I discussed the limitations, risks, security and privacy concerns of performing an evaluation and management service by telephone and the availability of in person appointments. I also discussed with the patient that there may be a patient responsible charge related to this service. The patient expressed understanding and agreed to proceed.   History of Present Illness: Melvin Willis is a 69 y.o. 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  Patient is status post whole brain radiation and is currently on treatment with Avastin.  MRI of the brain on 07/25/2019 revealed progression of enhancing lesion in the left temporal lobe concerning for either radiation necrosis or progressive metastatic disease.  Patient was referred to palliative care to help address goals and manage ongoing symptoms.   Observations/Objective: Virtual visit was attempted but patient did not respond to messaging.  I called him and spoke to him by phone.  Patient reports that he is doing reasonably well.  He denies any significant changes or concerns.  He feels overall that he is better than he was previously.  He continues to have neuropathic pain but feels that the gabapentin is managing it well.  Assessment and Plan: Stage IV lung cancer -currently on treatment with Avastin.  Followed by Dr. Rogue Bussing.  Seems to be tolerating treatments.  Will follow  Peripheral neuropathy -on gabapentin.  Could consider increasing frequency to twice daily or 3 times daily if needed  ACP -patient discussing decision making with his family  Follow Up Instructions: Follow-up MyChart in 2 months   I discussed the assessment and treatment plan with the patient. The patient was provided  an opportunity to ask questions and all were answered. The patient agreed with the plan and demonstrated an understanding of the instructions.   The patient was advised to call back or seek an in-person evaluation if the symptoms worsen or if the condition fails to improve as anticipated.  I provided 5 minutes of non-face-to-face time during this encounter.   Irean Hong, NP

## 2020-07-01 ENCOUNTER — Other Ambulatory Visit: Payer: Self-pay

## 2020-07-01 ENCOUNTER — Inpatient Hospital Stay: Payer: Medicare Other

## 2020-07-01 ENCOUNTER — Inpatient Hospital Stay (HOSPITAL_BASED_OUTPATIENT_CLINIC_OR_DEPARTMENT_OTHER): Payer: Medicare Other | Admitting: Internal Medicine

## 2020-07-01 ENCOUNTER — Encounter: Payer: Self-pay | Admitting: Internal Medicine

## 2020-07-01 DIAGNOSIS — Z86711 Personal history of pulmonary embolism: Secondary | ICD-10-CM | POA: Diagnosis not present

## 2020-07-01 DIAGNOSIS — E1122 Type 2 diabetes mellitus with diabetic chronic kidney disease: Secondary | ICD-10-CM | POA: Diagnosis not present

## 2020-07-01 DIAGNOSIS — C7931 Secondary malignant neoplasm of brain: Secondary | ICD-10-CM | POA: Diagnosis not present

## 2020-07-01 DIAGNOSIS — C3412 Malignant neoplasm of upper lobe, left bronchus or lung: Secondary | ICD-10-CM | POA: Diagnosis not present

## 2020-07-01 DIAGNOSIS — N183 Chronic kidney disease, stage 3 unspecified: Secondary | ICD-10-CM | POA: Diagnosis not present

## 2020-07-01 DIAGNOSIS — R972 Elevated prostate specific antigen [PSA]: Secondary | ICD-10-CM | POA: Diagnosis not present

## 2020-07-01 DIAGNOSIS — R42 Dizziness and giddiness: Secondary | ICD-10-CM | POA: Diagnosis not present

## 2020-07-01 DIAGNOSIS — M2550 Pain in unspecified joint: Secondary | ICD-10-CM | POA: Diagnosis not present

## 2020-07-01 DIAGNOSIS — R5383 Other fatigue: Secondary | ICD-10-CM | POA: Diagnosis not present

## 2020-07-01 DIAGNOSIS — Z818 Family history of other mental and behavioral disorders: Secondary | ICD-10-CM | POA: Diagnosis not present

## 2020-07-01 DIAGNOSIS — R200 Anesthesia of skin: Secondary | ICD-10-CM | POA: Diagnosis not present

## 2020-07-01 DIAGNOSIS — Z8673 Personal history of transient ischemic attack (TIA), and cerebral infarction without residual deficits: Secondary | ICD-10-CM | POA: Diagnosis not present

## 2020-07-01 DIAGNOSIS — E785 Hyperlipidemia, unspecified: Secondary | ICD-10-CM | POA: Diagnosis not present

## 2020-07-01 DIAGNOSIS — Z7901 Long term (current) use of anticoagulants: Secondary | ICD-10-CM | POA: Diagnosis not present

## 2020-07-01 DIAGNOSIS — N400 Enlarged prostate without lower urinary tract symptoms: Secondary | ICD-10-CM | POA: Diagnosis not present

## 2020-07-01 DIAGNOSIS — Z8719 Personal history of other diseases of the digestive system: Secondary | ICD-10-CM | POA: Diagnosis not present

## 2020-07-01 DIAGNOSIS — Z833 Family history of diabetes mellitus: Secondary | ICD-10-CM | POA: Diagnosis not present

## 2020-07-01 DIAGNOSIS — Z8249 Family history of ischemic heart disease and other diseases of the circulatory system: Secondary | ICD-10-CM | POA: Diagnosis not present

## 2020-07-01 DIAGNOSIS — G629 Polyneuropathy, unspecified: Secondary | ICD-10-CM | POA: Diagnosis not present

## 2020-07-01 DIAGNOSIS — Z79899 Other long term (current) drug therapy: Secondary | ICD-10-CM | POA: Diagnosis not present

## 2020-07-01 DIAGNOSIS — C7951 Secondary malignant neoplasm of bone: Secondary | ICD-10-CM | POA: Diagnosis not present

## 2020-07-01 LAB — CBC WITH DIFFERENTIAL/PLATELET
Abs Immature Granulocytes: 0.02 10*3/uL (ref 0.00–0.07)
Basophils Absolute: 0 10*3/uL (ref 0.0–0.1)
Basophils Relative: 1 %
Eosinophils Absolute: 0.2 10*3/uL (ref 0.0–0.5)
Eosinophils Relative: 4 %
HCT: 39.5 % (ref 39.0–52.0)
Hemoglobin: 13.7 g/dL (ref 13.0–17.0)
Immature Granulocytes: 0 %
Lymphocytes Relative: 42 %
Lymphs Abs: 2.3 10*3/uL (ref 0.7–4.0)
MCH: 30.1 pg (ref 26.0–34.0)
MCHC: 34.7 g/dL (ref 30.0–36.0)
MCV: 86.8 fL (ref 80.0–100.0)
Monocytes Absolute: 0.8 10*3/uL (ref 0.1–1.0)
Monocytes Relative: 15 %
Neutro Abs: 2.1 10*3/uL (ref 1.7–7.7)
Neutrophils Relative %: 38 %
Platelets: 190 10*3/uL (ref 150–400)
RBC: 4.55 MIL/uL (ref 4.22–5.81)
RDW: 14.6 % (ref 11.5–15.5)
WBC: 5.5 10*3/uL (ref 4.0–10.5)
nRBC: 0 % (ref 0.0–0.2)

## 2020-07-01 LAB — COMPREHENSIVE METABOLIC PANEL
ALT: 25 U/L (ref 0–44)
AST: 19 U/L (ref 15–41)
Albumin: 3.8 g/dL (ref 3.5–5.0)
Alkaline Phosphatase: 51 U/L (ref 38–126)
Anion gap: 8 (ref 5–15)
BUN: 17 mg/dL (ref 8–23)
CO2: 24 mmol/L (ref 22–32)
Calcium: 8.8 mg/dL — ABNORMAL LOW (ref 8.9–10.3)
Chloride: 105 mmol/L (ref 98–111)
Creatinine, Ser: 1.55 mg/dL — ABNORMAL HIGH (ref 0.61–1.24)
GFR, Estimated: 49 mL/min — ABNORMAL LOW (ref >60)
Glucose, Bld: 165 mg/dL — ABNORMAL HIGH (ref 70–99)
Potassium: 4.3 mmol/L (ref 3.5–5.1)
Sodium: 137 mmol/L (ref 135–145)
Total Bilirubin: 0.7 mg/dL (ref 0.3–1.2)
Total Protein: 6.9 g/dL (ref 6.5–8.1)

## 2020-07-01 NOTE — Assessment & Plan Note (Addendum)
#  Adenocarcinoma of the lung metastatic to brain/stage IV-ROS-1 positive;  AUG 23rd 2021-CT chest left upper lobe treatment changes; STABLE.   # Continue 2 pills Rozyltek [CKD-StageIII]; continue 2 pills a day.  # Dizziness- subjective;s/p ENT evaluation; MRI brain Oct 2021- STABLE lesions- STABLE.    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   # CKD stage III-creatinine 1.7 [GFR~49]-STABLE.   # Peripheral Neuropathy- ? DM;continue  gabapentin 200 mg qhs-STABLE.   # Prostatism- on flomax; added finasteride STABLE. Reviewed the urology note.   # Flu shot/COVID booster- discussed at retail pharmacy.   # DISPOSITION:   follow up in 4 weeks MD;labs- cbc/cmp; --Dr.B      

## 2020-07-01 NOTE — Progress Notes (Signed)
[facility] OFFICE PROGRESS NOTE  Patient Care Team: [name], [name], MD as Medical Oncologist (Medical Oncology) Cathi Roan, [facility] (Pharmacist)  Cancer Staging No matching staging information was found for the patient.   Oncology History Overview Note  # OCT 2017- ADENO CA LUNG; STAGE IV [; LUL; bil supraclavicular LN; Left neck LN Bx]; ROS-1 MUTATED; s/p Carbo-alimta x1[oct 2017]  # NOV 1st 2017- XALKORI 250 mg BID; JAN 15th CT- PR;  # AUG 27th Chemo-RT to persistent LUL/mediastinal LN [s/p carbo-taxol- with RT; finished Sep 22nd 2018];   # July 14th 2020- multiple metastatic lesions of the brain [July 30 whole brain radiation finished April 21, 2019]; bone scan skull metastases /posterior left ninth rib metastases; April 04, 2019 stopped crizotinib;  #April 25, 2019-start Entrectinib 600 mg once a day; STopped in NOV 2020 [dizziness]; NOV 2020-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   # LLE DVT/bil PE/Multiple strokes [? On xarelto]-Lovenox; Jan mid 2018- xarelto [lovenox-insurance issues]  # MRI brain- multiple infarcts [2d echo/bubble study-NEG's/p Neurology eval]  ------------------------------------------------------------    # # s/p TURP [Sep 2017; Dr.Cope] DEC 26th CT- distended bladder;  # Elevated PSA-  FEB 2021- 12;  S/p Bx- Negative; follow with urology/Dr.Stoioff   # MOLECULAR TESTING- ROS-1 POSITIVE; ALK/EGFR-NEG; PDL-1 EXPRESSION- 90%** [HIGH]  # DEC 15th 2020- PALLIATIVE CARE -----------------------------------------------------------------    DIAGNOSIS: Adenocarcinoma the lung  ROS-1 +  STAGE:  IV    ;GOALS: Palliative  CURRENT/MOST RECENT  THERAPY- Avastin [C]   Primary cancer of left upper lobe of lung ([place])  08/09/2019 -  Chemotherapy   The patient had bevacizumab-awwb (MVASI) 800 mg in sodium chloride 0.9 % 100 mL chemo infusion, 725 mg, Intravenous,  Once, 6 of 6 cycles Dose modification: 10 mg/kg (original dose 10 mg/kg, Cycle 3, Reason: Other (see comments), Comment: weight change) Administration: 800 mg (08/09/2019), 800 mg (08/22/2019), 700 mg (09/19/2019), 700 mg (10/04/2019), 700 mg (10/18/2019), 700 mg (11/01/2019)  for chemotherapy treatment.        INTERVAL HISTORY:  Melvin Willis 69 y.o.  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  In the interim patient was evaluated by ENT for his dizzy spells.  Currently improved.  Patient also was evaluated by urology-started on finasteride for his prostatism symptoms.  Patient denies any weakness at this upper or lower extremities.  No headaches.  Mild numbness in his legs/pain improved.  Continues to be on Neurontin.   Review of Systems  Constitutional: Positive for malaise/fatigue. Negative for chills, diaphoresis, fever and weight loss.  HENT: Negative for nosebleeds and sore throat.   Eyes: Negative for double vision.  Respiratory: Negative for cough, hemoptysis, sputum production, shortness of breath and wheezing.   Cardiovascular: Negative for chest pain (Left upper chest wall pain chronic), palpitations and orthopnea.  Gastrointestinal: Negative for abdominal pain, blood in stool, constipation, diarrhea, heartburn and melena.  Genitourinary: Negative for dysuria.  Musculoskeletal: Positive for joint pain. Negative for back pain.  Skin: Negative.  Negative for itching and rash.  Neurological: Positive for tingling. Negative for focal weakness and weakness.  Endo/Heme/Allergies: Does not bruise/bleed easily.  Psychiatric/Behavioral: Positive for  memory loss.  Negative for depression. The patient is not nervous/anxious and does not have insomnia.     PAST MEDICAL HISTORY :  Past Medical History:  Diagnosis Date  . Abnormal prostate specific antigen 08/08/2012  . Adiposity 04/16/2015  . Chronic kidney disease (CKD), stage III (moderate) ([place]) 11/27/2016  . CVA (cerebral vascular accident) ([place]) 06/17/2016  . Diabetes mellitus without complication ([place])   . Diverticulosis of sigmoid colon 04/16/2015  . Dyslipidemia 03/18/2015  . Hemorrhoids, internal 04/16/2015  . Hypercholesteremia 04/16/2015  . Hyperlipidemia   . Hypertension   . Primary cancer of left upper lobe of lung ([place])   . Pulmonary embolism ([place])   . Wears dentures    partial upper    PAST SURGICAL HISTORY :   Past Surgical History:  Procedure Laterality Date  . COLONOSCOPY    . COLONOSCOPY WITH PROPOFOL N/A 05/06/2015   Procedure: COLONOSCOPY WITH PROPOFOL;  Surgeon: Lucilla Lame, MD;  Location: [facility];  Service: Endoscopy;  Laterality: N/A;  ASCENDING COLON POLYPS X 2 TERMINAL ILEUM BIOPSY RANDOM COLON BX. TRANSVERSE COLON POLYP SIGMOID COLON POLYP  . ESOPHAGOGASTRODUODENOSCOPY (EGD) WITH PROPOFOL N/A 05/06/2015   Procedure: ESOPHAGOGASTRODUODENOSCOPY (EGD) WITH PROPOFOL;  Surgeon: Lucilla Lame, MD;  Location: [facility];  Service: Endoscopy;  Laterality: N/A;  GASTRIC BIOPSY X1  . KIDNEY STONE SURGERY  2017    FAMILY HISTORY :   Family History  Problem Relation Age of Onset  . Diabetes Mother   . Diabetes Father   . CAD Father   . Dementia Father   . Diabetes Sister   . Cancer Maternal Uncle        Prostate  . Cancer Cousin        prostate    SOCIAL HISTORY:   Social History   Tobacco Use  . Smoking status: Never Smoker  . Smokeless tobacco: Never Used  Vaping Use  . Vaping Use: Never used  Substance Use Topics  . Alcohol use: No    Alcohol/week: 0.0 standard drinks  . Drug use: No    ALLERGIES:  has No Known Allergies.  MEDICATIONS:   Current Outpatient Medications  Medication Sig Dispense Refill  . atorvastatin (LIPITOR) 40 MG tablet Take 1 tablet (40 mg total) by mouth 3 (three) times a week. 48 tablet 1  . blood glucose meter kit and supplies Dispense based on patient and insurance preference (Accuchek Aviva Plus, Accuchek Nano). Use up to four times daily as directed. (FOR ICD-10 E10.9, E11.9). 1 each 0  . entrectinib (ROZLYTREK) 200 MG capsule Take 3 capsules (600 mg total) by mouth daily. (Patient taking differently: Take 600 mg by mouth daily. Pt reports taking 2 pills daily per MD) 90 capsule 6  . finasteride (PROSCAR) 5 MG tablet Take 1 tablet (5 mg total) by mouth daily. 90 tablet 3  . gabapentin (NEURONTIN) 300 MG capsule     . metFORMIN (GLUCOPHAGE) 500 MG tablet TAKE 1 TABLET BY MOUTH TWICE DAILY WITH A MEAL 180 tablet 1  . ondansetron (ZOFRAN) 8 MG tablet One pill every 8 hours as needed for nausea/vomitting. 40 tablet 1  . tamsulosin (FLOMAX) 0.4 MG CAPS capsule Take 1 capsule by mouth once daily in the morning 90 capsule 3  . XARELTO 20 MG TABS tablet TAKE 1 TABLET BY MOUTH ONCE DAILY WITH SUPPER 30 tablet 6  . meclizine (ANTIVERT) 25 MG tablet Take 1 tablet (25 mg total) by mouth 3 (three) times daily as needed for  dizziness. (Patient not taking: Reported on 07/01/2020) 45 tablet 0   No current facility-administered medications for this visit.    PHYSICAL EXAMINATION: ECOG PERFORMANCE STATUS: 0 - Asymptomatic  BP 132/89 (BP Location: Left Arm, Patient Position: Sitting)   Pulse 76   Temp (!) 96.7 F (35.9 C) (Tympanic)   Resp 18   Wt 172 lb 6.4 oz (78.2 kg)   SpO2 97%   BMI 29.59 kg/m   Filed Weights   07/01/20 0941  Weight: 172 lb 6.4 oz (78.2 kg)    Physical Exam Constitutional:      Comments: Patient is alone.  He is walking by himself.  HENT:     Head: Normocephalic and atraumatic.     Ears:     Comments: Bilateral cerumen impaction.    Mouth/Throat:     Pharynx: No oropharyngeal  exudate.  Eyes:     Pupils: Pupils are equal, round, and reactive to light.  Cardiovascular:     Rate and Rhythm: Normal rate and regular rhythm.  Pulmonary:     Effort: No respiratory distress.     Breath sounds: No wheezing.     Comments: Decreased breath sounds bilaterally no wheeze or crackles. Abdominal:     General: Bowel sounds are normal. There is no distension.     Palpations: Abdomen is soft. There is no mass.     Tenderness: There is no abdominal tenderness. There is no guarding or rebound.  Musculoskeletal:        General: No tenderness. Normal range of motion.     Cervical back: Normal range of motion and neck supple.  Skin:    General: Skin is warm.  Neurological:     Mental Status: He is alert and oriented to person, place, and time.  Psychiatric:        Mood and Affect: Affect normal.    LABORATORY DATA:  I have reviewed the data as listed    Component Value Date/Time   NA 137 07/01/2020 0920   NA 139 01/01/2016 1000   K 4.3 07/01/2020 0920   CL 105 07/01/2020 0920   CO2 24 07/01/2020 0920   GLUCOSE 165 (H) 07/01/2020 0920   BUN 17 07/01/2020 0920   BUN 13 01/01/2016 1000   CREATININE 1.55 (H) 07/01/2020 0920   CREATININE 1.32 (H) 08/22/2018 1107   CALCIUM 8.8 (L) 07/01/2020 0920   PROT 6.9 07/01/2020 0920   PROT 7.2 01/01/2016 1000   ALBUMIN 3.8 07/01/2020 0920   ALBUMIN 4.4 01/01/2016 1000   AST 19 07/01/2020 0920   ALT 25 07/01/2020 0920   ALKPHOS 51 07/01/2020 0920   BILITOT 0.7 07/01/2020 0920   BILITOT 0.4 01/01/2016 1000   GFRNONAA 49 (L) 07/01/2020 0920   GFRNONAA 56 (L) 08/22/2018 1107   GFRAA 45 (L) 06/03/2020 0951   GFRAA 65 08/22/2018 1107    No results found for: SPEP, UPEP  Lab Results  Component Value Date   WBC 5.5 07/01/2020   NEUTROABS 2.1 07/01/2020   HGB 13.7 07/01/2020   HCT 39.5 07/01/2020   MCV 86.8 07/01/2020   PLT 190 07/01/2020      Chemistry      Component Value Date/Time   NA 137 07/01/2020 0920   NA 139  01/01/2016 1000   K 4.3 07/01/2020 0920   CL 105 07/01/2020 0920   CO2 24 07/01/2020 0920   BUN 17 07/01/2020 0920   BUN 13 01/01/2016 1000   CREATININE 1.55 (H) 07/01/2020 0920  CREATININE 1.32 (H) 08/22/2018 1107      Component Value Date/Time   CALCIUM 8.8 (L) 07/01/2020 0920   ALKPHOS 51 07/01/2020 0920   AST 19 07/01/2020 0920   ALT 25 07/01/2020 0920   BILITOT 0.7 07/01/2020 0920   BILITOT 0.4 01/01/2016 1000       RADIOGRAPHIC STUDIES: I have personally reviewed the radiological images as listed and agreed with the findings in the report. No results found.   ASSESSMENT & PLAN:  Primary cancer of left upper lobe of lung ([place]) # Adenocarcinoma of the lung metastatic to brain/stage IV-ROS-1 positive;  AUG 23rd 2021-CT chest left upper lobe treatment changes; STABLE.   # Continue 2 pills Rozyltek [CKD-StageIII]; continue 2 pills a day.  # Dizziness- subjective;s/p ENT evaluation; MRI brain Oct 2021- STABLE lesions- STABLE.    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   # CKD stage III-creatinine 1.7 [GFR~49]-STABLE.   # Peripheral Neuropathy- ? DM;continue  gabapentin 200 mg qhs-STABLE.   # Prostatism- on flomax; added finasteride STABLE. Reviewed the urology note.   # Flu shot/COVID booster- discussed at retail pharmacy.   # DISPOSITION:   follow up in 4 weeks MD;labs- cbc/cmp; --Dr.B        No orders of the defined types were placed in this encounter.  All questions were answered. The patient knows to call the clinic with any problems, questions or concerns.      Cammie Sickle, MD 07/02/2020 7:37 AM

## 2020-07-01 NOTE — Progress Notes (Signed)
Pt in for follow up, denies any concerns today. 

## 2020-07-10 DIAGNOSIS — Z23 Encounter for immunization: Secondary | ICD-10-CM | POA: Diagnosis not present

## 2020-07-25 NOTE — Progress Notes (Signed)
Name: Melvin Willis   MRN: [id number]    DOB: 1951/06/12   Date:07/26/2020       Progress Note  Subjective  Chief Complaint  Follow up  HPI   DMII: hestill takingmetformin three 500 mg daily  and off Glipizide - stopped it on his own, off lisinopril , urine micro was 50 but last level was normal done 04/2020 He is taking Atorvastatin 3 times a week, A1C has been around 7%, he states still uses [place] occasionally, but usually glucose fasting around 150's or lower, this morning it was up at 180. He ate sandwich and fries last night. Discussed importance of diet, we will try Starlix prn, change to Metformin ER 1500 mg because of increase of diarrhea with 3 pills daily.  He had one episode of bowel incontinence - thought he was going to pass gas. He states no longer passing gas without going to the bathroom   Chronic DVT left leg, history of PE : on long term Xarelto, rx given by Hematologist. He denies easy bruising. He was diagnosed with DVT and PE prior to cancer diagnosis back in 2017. 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 Mickel Fuchs and Dr. Baruch Gouty, started on decadron and complete brain radiation in October and developed hearing loss but has regaining his hearing since.   History of CVA: initially had dizziness and left side weakness, but no sequela now, still on statin and also on Xarelto, only takes statins a few times a weekand last lipid panel was at goal, we will recheck labs today   Atherosclerosis of aorta: on statin therapy, recheck labs   CKI stage III: we will recheck level, he denies pruritis, good urine output    Patient Active Problem List   Diagnosis Date Noted  . Brain metastases ([place]) 09/25/2019  . Atherosclerosis  of aorta ([place]) 09/04/2019  . Goals of care, counseling/discussion 08/02/2019  . Primary malignant neoplasm of lung metastatic to other site ([place]) 04/07/2019  . Chronic deep vein thrombosis (DVT) of left lower extremity ([place]) 01/05/2018  . Chronic kidney disease (CKD), stage III (moderate) ([employer]) 11/27/2016  . Old cerebrovascular accident (CVA) without late effect   . Blurred vision, bilateral 06/23/2016  . Primary cancer of left upper lobe of lung ([place]) 06/12/2016  . Mediastinal mass   . History of nephrolithiasis 03/27/2016  . Pharyngeal dysphagia 01/01/2016  . Benign neoplasm of ascending colon   . Benign neoplasm of sigmoid colon   . Benign neoplasm of transverse colon   . Diverticulosis of large intestine without diverticulitis   . Overweight (BMI 25.0-29.9) 04/16/2015  . Controlled diabetes mellitus with chronic kidney disease ([place]) 03/18/2015  . Dyslipidemia 03/18/2015  . Disorder of male genital organ 08/08/2012  . 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 Elevated PSA 08/08/2012    Past Surgical History:  Procedure Laterality Date  . COLONOSCOPY    . COLONOSCOPY WITH PROPOFOL N/A 05/06/2015   Procedure: COLONOSCOPY WITH PROPOFOL;  Surgeon: Lucilla Lame, MD;  Location: [facility];  Service: Endoscopy;  Laterality: N/A;  ASCENDING COLON POLYPS X 2 TERMINAL ILEUM BIOPSY RANDOM COLON BX. TRANSVERSE COLON POLYP SIGMOID COLON POLYP  . ESOPHAGOGASTRODUODENOSCOPY (EGD) WITH PROPOFOL N/A 05/06/2015   Procedure: ESOPHAGOGASTRODUODENOSCOPY (EGD)  WITH PROPOFOL;  Surgeon: Lucilla Lame, MD;  Location: [facility];  Service: Endoscopy;  Laterality: N/A;  GASTRIC BIOPSY X1  . KIDNEY STONE SURGERY  2017    Family History  Problem Relation Age of Onset  . Diabetes Mother   . Diabetes Father   . CAD Father   . Dementia Father   . Diabetes Sister   . Cancer Maternal Uncle        Prostate  . Cancer Cousin        prostate    Social History   Tobacco Use   . Smoking status: Never Smoker  . Smokeless tobacco: Never Used  Substance Use Topics  . Alcohol use: No    Alcohol/week: 0.0 standard drinks     Current Outpatient Medications:  .  atorvastatin (LIPITOR) 40 MG tablet, Take 1 tablet (40 mg total) by mouth 3 (three) times a week., Disp: 48 tablet, Rfl: 1 .  blood glucose meter kit and supplies, Dispense based on patient and insurance preference (Accuchek Aviva Plus, Accuchek Nano). Use up to four times daily as directed. (FOR ICD-10 E10.9, E11.9)., Disp: 1 each, Rfl: 0 .  entrectinib (ROZLYTREK) 200 MG capsule, Take 3 capsules (600 mg total) by mouth daily. (Patient taking differently: Take 600 mg by mouth daily. Pt reports taking 2 pills daily per MD), Disp: 90 capsule, Rfl: 6 .  finasteride (PROSCAR) 5 MG tablet, Take 1 tablet (5 mg total) by mouth daily., Disp: 90 tablet, Rfl: 3 .  gabapentin (NEURONTIN) 300 MG capsule, Take 1 capsule by mouth at bedtime., Disp: , Rfl:  .  meclizine (ANTIVERT) 25 MG tablet, Take 1 tablet (25 mg total) by mouth 3 (three) times daily as needed for dizziness., Disp: 45 tablet, Rfl: 0 .  ondansetron (ZOFRAN) 8 MG tablet, One pill every 8 hours as needed for nausea/vomitting., Disp: 40 tablet, Rfl: 1 .  tamsulosin (FLOMAX) 0.4 MG CAPS capsule, Take 1 capsule by mouth once daily in the morning, Disp: 90 capsule, Rfl: 3 .  XARELTO 20 MG TABS tablet, TAKE 1 TABLET BY MOUTH ONCE DAILY WITH SUPPER, Disp: 30 tablet, Rfl: 6 .  metFORMIN (GLUCOPHAGE-XR) 750 MG 24 hr tablet, Take 2 tablets (1,500 mg total) by mouth daily. When out of 500 mg pills, Disp: 180 tablet, Rfl: 0 .  nateglinide (STARLIX) 60 MG tablet, Take 1 tablet (60 mg total) by mouth 3 (three) times daily with meals., Disp: 90 tablet, Rfl: 0  No Known Allergies  I personally reviewed active problem list, medication list, allergies, family history, social history, health maintenance with the patient/caregiver today.   ROS  Constitutional: Negative for  fever or weight change.  Respiratory: Negative for cough and shortness of breath.   Cardiovascular: Negative for chest pain or palpitations.  Gastrointestinal: Negative for abdominal pain, no bowel changes.  Musculoskeletal: positive  for gait problem has some balance problems since brain radiation, no joint swelling.  Skin: Negative for rash.  Neurological: Negative for dizziness or headache.  No other specific complaints in a complete review of systems (except as listed in HPI above).  Objective  Vitals:   07/26/20 0909  BP: 130/88  Pulse: 88  Resp: 16  Temp: 98 F (36.7 C)  TempSrc: Oral  SpO2: 97%  Weight: 172 lb 9.6 oz (78.3 kg)  Height: '5\' 4"'  (1.626 m)    Body mass index is 29.63 kg/m.  Physical Exam  Constitutional: Patient appears well-developed and well-nourished.No distress.  HEENT: head atraumatic, normocephalic,  pupils equal and reactive to light,  neck supple Cardiovascular: Normal rate, regular rhythm and normal heart sounds.  No murmur heard. No BLE edema. Pulmonary/Chest: Effort normal and breath sounds normal. No respiratory distress. Abdominal: Soft.  There is no tenderness. Psychiatric: Patient has a normal mood and affect. behavior is normal. Judgment and thought content normal.   Recent Results (from the past 2160 hour(s))  CBC with Differential     Status: None   Collection Time: 05/06/20  1:00 PM  Result Value Ref Range   WBC 5.1 4.0 - 10.5 K/uL   RBC 4.84 4.22 - 5.81 MIL/uL   Hemoglobin 14.8 13.0 - 17.0 g/dL   HCT 42.7 39 - 52 %   MCV 88.2 80.0 - 100.0 fL   MCH 30.6 26.0 - 34.0 pg   MCHC 34.7 30.0 - 36.0 g/dL   RDW 13.9 11.5 - 15.5 %   Platelets 191 150 - 400 K/uL   nRBC 0.0 0.0 - 0.2 %   Neutrophils Relative % 47 %   Neutro Abs 2.4 1.7 - 7.7 K/uL   Lymphocytes Relative 40 %   Lymphs Abs 2.0 0.7 - 4.0 K/uL   Monocytes Relative 10 %   Monocytes Absolute 0.5 0.1 - 1.0 K/uL   Eosinophils Relative 2 %   Eosinophils Absolute 0.1 0.0 - 0.5  K/uL   Basophils Relative 1 %   Basophils Absolute 0.0 0.0 - 0.1 K/uL   Immature Granulocytes 0 %   Abs Immature Granulocytes 0.01 0.00 - 0.07 K/uL    Comment: Performed at [facility], [address] [place], [place] [address]  Comprehensive metabolic panel     Status: Abnormal   Collection Time: 05/06/20  1:00 PM  Result Value Ref Range   Sodium 138 135 - 145 mmol/L   Potassium 4.4 3.5 - 5.1 mmol/L   Chloride 106 98 - 111 mmol/L   CO2 23 22 - 32 mmol/L   Glucose, Bld 283 (H) 70 - 99 mg/dL    Comment: Glucose reference range applies only to samples taken after fasting for at least 8 hours.   BUN 23 8 - 23 mg/dL   Creatinine, Ser 1.62 (H) 0.61 - 1.24 mg/dL   Calcium 9.2 8.9 - 10.3 mg/dL   Total Protein 7.6 6.5 - 8.1 g/dL   Albumin 4.3 3.5 - 5.0 g/dL   AST 24 15 - 41 U/L   ALT 28 0 - 44 U/L   Alkaline Phosphatase 50 38 - 126 U/L   Total Bilirubin 0.7 0.3 - 1.2 mg/dL   GFR calc non Af Amer 44 (L) >60 mL/min   GFR calc Af Amer 51 (L) >60 mL/min   Anion gap 9 5 - 15    Comment: Performed at [facility], [address] [place], [place] [address]  HM DIABETES EYE EXAM     Status: None   Collection Time: 05/22/20 12:00 AM  Result Value Ref Range   HM Diabetic Eye Exam No Retinopathy No Retinopathy  Comprehensive metabolic panel     Status: Abnormal   Collection Time: 06/03/20  9:51 AM  Result Value Ref Range   Sodium 141 135 - 145 mmol/L   Potassium 4.1 3.5 - 5.1 mmol/L   Chloride 109 98 - 111 mmol/L   CO2 22 22 - 32 mmol/L   Glucose, Bld 187 (H) 70 - 99 mg/dL    Comment: Glucose reference range applies only to samples taken after fasting for at least  8 hours.   BUN 24 (H) 8 - 23 mg/dL   Creatinine, Ser 1.79 (H) 0.61 - 1.24 mg/dL   Calcium 9.0 8.9 - 10.3 mg/dL   Total Protein 7.4 6.5 - 8.1 g/dL   Albumin 4.1 3.5 - 5.0 g/dL   AST 21 15 - 41 U/L   ALT 27 0 - 44 U/L   Alkaline Phosphatase 48 38 - 126 U/L   Total Bilirubin 0.7 0.3 - 1.2 mg/dL   GFR calc non Af  Amer 39 (L) >60 mL/min   GFR calc Af Amer 45 (L) >60 mL/min   Anion gap 10 5 - 15    Comment: Performed at [facility], [address] [place], [place] [address]  CBC with Differential     Status: None   Collection Time: 06/03/20  9:51 AM  Result Value Ref Range   WBC 6.0 4.0 - 10.5 K/uL   RBC 4.69 4.22 - 5.81 MIL/uL   Hemoglobin 14.2 13.0 - 17.0 g/dL   HCT 41.2 39 - 52 %   MCV 87.8 80.0 - 100.0 fL   MCH 30.3 26.0 - 34.0 pg   MCHC 34.5 30.0 - 36.0 g/dL   RDW 14.6 11.5 - 15.5 %   Platelets 218 150 - 400 K/uL   nRBC 0.0 0.0 - 0.2 %   Neutrophils Relative % 41 %   Neutro Abs 2.5 1.7 - 7.7 K/uL   Lymphocytes Relative 41 %   Lymphs Abs 2.5 0.7 - 4.0 K/uL   Monocytes Relative 13 %   Monocytes Absolute 0.8 0.1 - 1.0 K/uL   Eosinophils Relative 4 %   Eosinophils Absolute 0.2 0.0 - 0.5 K/uL   Basophils Relative 1 %   Basophils Absolute 0.0 0.0 - 0.1 K/uL   Immature Granulocytes 0 %   Abs Immature Granulocytes 0.01 0.00 - 0.07 K/uL    Comment: Performed at [facility], [address] [place], [place] [address]  BLADDER SCAN AMB NON-IMAGING     Status: None   Collection Time: 06/13/20  8:57 AM  Result Value Ref Range   Scan Result 115 ml   Comprehensive metabolic panel     Status: Abnormal   Collection Time: 07/01/20  9:20 AM  Result Value Ref Range   Sodium 137 135 - 145 mmol/L   Potassium 4.3 3.5 - 5.1 mmol/L   Chloride 105 98 - 111 mmol/L   CO2 24 22 - 32 mmol/L   Glucose, Bld 165 (H) 70 - 99 mg/dL    Comment: Glucose reference range applies only to samples taken after fasting for at least 8 hours.   BUN 17 8 - 23 mg/dL   Creatinine, Ser 1.55 (H) 0.61 - 1.24 mg/dL   Calcium 8.8 (L) 8.9 - 10.3 mg/dL   Total Protein 6.9 6.5 - 8.1 g/dL   Albumin 3.8 3.5 - 5.0 g/dL   AST 19 15 - 41 U/L   ALT 25 0 - 44 U/L   Alkaline Phosphatase 51 38 - 126 U/L   Total Bilirubin 0.7 0.3 - 1.2 mg/dL   GFR, Estimated 49 (L) >60 mL/min    Comment: (NOTE) Calculated using the  CKD-EPI Creatinine Equation (2021)    Anion gap 8 5 - 15    Comment: Performed at [facility], [address] [place], [place] [address]  CBC with Differential     Status: None   Collection Time: 07/01/20  9:20 AM  Result Value Ref Range  WBC 5.5 4.0 - 10.5 K/uL   RBC 4.55 4.22 - 5.81 MIL/uL   Hemoglobin 13.7 13.0 - 17.0 g/dL   HCT 39.5 39 - 52 %   MCV 86.8 80.0 - 100.0 fL   MCH 30.1 26.0 - 34.0 pg   MCHC 34.7 30.0 - 36.0 g/dL   RDW 14.6 11.5 - 15.5 %   Platelets 190 150 - 400 K/uL   nRBC 0.0 0.0 - 0.2 %   Neutrophils Relative % 38 %   Neutro Abs 2.1 1.7 - 7.7 K/uL   Lymphocytes Relative 42 %   Lymphs Abs 2.3 0.7 - 4.0 K/uL   Monocytes Relative 15 %   Monocytes Absolute 0.8 0.1 - 1.0 K/uL   Eosinophils Relative 4 %   Eosinophils Absolute 0.2 0.0 - 0.5 K/uL   Basophils Relative 1 %   Basophils Absolute 0.0 0.0 - 0.1 K/uL   Immature Granulocytes 0 %   Abs Immature Granulocytes 0.02 0.00 - 0.07 K/uL    Comment: Performed at [facility], [address], [place] [address]    Diabetic Foot Exam: Diabetic Foot Exam - Simple   Simple Foot Form Diabetic Foot exam was performed with the following findings: Yes 07/26/2020 10:05 AM  Visual Inspection See comments: Yes Sensation Testing Intact to touch and monofilament testing bilaterally: Yes Pulse Check Posterior Tibialis and Dorsalis pulse intact bilaterally: Yes Comments Tinea pedis      PHQ2/9: Depression screen [facility] 2/9 07/26/2020 04/24/2020 04/18/2020 09/06/2019 08/07/2019  Decreased Interest 0 0 0 0 0  Down, Depressed, Hopeless 0 0 0 0 0  PHQ - 2 Score 0 0 0 0 0  Altered sleeping - 0 - 0 0  Tired, decreased energy - 0 - 0 0  Change in appetite - 0 - 0 0  Feeling bad or failure about yourself  - 0 - 0 0  Trouble concentrating - 0 - 0 0  Moving slowly or fidgety/restless - 0 - 0 0  Suicidal thoughts - 0 - 0 0  PHQ-9 Score - 0 - 0 0  Difficult doing work/chores - Not difficult at all - Not  difficult at all -  Some recent data might be hidden    phq 9 is negative   Fall Risk: Fall Risk  07/26/2020 04/24/2020 04/18/2020 10/18/2019 09/06/2019  Falls in the past year? 0 0 0 0 0  Number falls in past yr: 0 0 0 - 0  Injury with Fall? 0 0 0 - 0  Risk for fall due to : - - No Fall Risks - -  Follow up - - Falls prevention discussed - Falls evaluation completed     Functional Status Survey: Is the patient deaf or have difficulty hearing?: No Does the patient have difficulty seeing, even when wearing glasses/contacts?: No Does the patient have difficulty concentrating, remembering, or making decisions?: Yes Does the patient have difficulty walking or climbing stairs?: Yes Does the patient have difficulty dressing or bathing?: No Does the patient have difficulty doing errands alone such as visiting a doctor's office or shopping?: Yes    Assessment & Plan  1. Controlled type 2 diabetes mellitus with chronic kidney disease, without long-term current use of insulin, unspecified CKD stage (HCC)  - HM Diabetes Foot Exam - Lipid panel - Hemoglobin A1c  2. Need for 23-polyvalent pneumococcal polysaccharide vaccine  - Pneumococcal polysaccharide vaccine 23-valent greater than or equal to 2yo subcutaneous/IM  3. Primary cancer of left upper lobe  of lung ([place])   4. Colon cancer screening  - Ambulatory referral to Gastroenterology  5. DM type 2 with diabetic dyslipidemia (HCC)   6. Stage 3a chronic kidney disease (HCC)  - COMPLETE METABOLIC PANEL WITH GFR  7. Dyslipidemia  - Lipid panel  8. Old cerebrovascular accident (CVA) without late effect   9. Chronic deep vein thrombosis (DVT) of left lower extremity, unspecified vein (HCC)   10. Primary malignant neoplasm of lung metastatic to other site, unspecified laterality ([place])   11. Nodular prostate with urinary obstruction  Seeing Urologist   12. Atherosclerosis of aorta ([place])

## 2020-07-26 ENCOUNTER — Other Ambulatory Visit: Payer: Self-pay

## 2020-07-26 ENCOUNTER — Encounter: Payer: Self-pay | Admitting: Family Medicine

## 2020-07-26 ENCOUNTER — Ambulatory Visit (INDEPENDENT_AMBULATORY_CARE_PROVIDER_SITE_OTHER): Payer: Medicare Other | Admitting: Family Medicine

## 2020-07-26 VITALS — BP 130/88 | HR 88 | Temp 98.0°F | Resp 16 | Ht 64.0 in | Wt 172.6 lb

## 2020-07-26 DIAGNOSIS — Z1211 Encounter for screening for malignant neoplasm of colon: Secondary | ICD-10-CM

## 2020-07-26 DIAGNOSIS — Z23 Encounter for immunization: Secondary | ICD-10-CM | POA: Diagnosis not present

## 2020-07-26 DIAGNOSIS — N403 Nodular prostate with lower urinary tract symptoms: Secondary | ICD-10-CM | POA: Diagnosis not present

## 2020-07-26 DIAGNOSIS — N138 Other obstructive and reflux uropathy: Secondary | ICD-10-CM

## 2020-07-26 DIAGNOSIS — N1831 Chronic kidney disease, stage 3a: Secondary | ICD-10-CM

## 2020-07-26 DIAGNOSIS — E785 Hyperlipidemia, unspecified: Secondary | ICD-10-CM

## 2020-07-26 DIAGNOSIS — I700 Atherosclerosis of aorta: Secondary | ICD-10-CM | POA: Diagnosis not present

## 2020-07-26 DIAGNOSIS — E1122 Type 2 diabetes mellitus with diabetic chronic kidney disease: Secondary | ICD-10-CM

## 2020-07-26 DIAGNOSIS — C3412 Malignant neoplasm of upper lobe, left bronchus or lung: Secondary | ICD-10-CM

## 2020-07-26 DIAGNOSIS — C3490 Malignant neoplasm of unspecified part of unspecified bronchus or lung: Secondary | ICD-10-CM

## 2020-07-26 DIAGNOSIS — I82502 Chronic embolism and thrombosis of unspecified deep veins of left lower extremity: Secondary | ICD-10-CM

## 2020-07-26 DIAGNOSIS — Z8673 Personal history of transient ischemic attack (TIA), and cerebral infarction without residual deficits: Secondary | ICD-10-CM

## 2020-07-26 DIAGNOSIS — E1169 Type 2 diabetes mellitus with other specified complication: Secondary | ICD-10-CM

## 2020-07-26 MED ORDER — NATEGLINIDE 60 MG PO TABS: 60.0000 mg | ORAL_TABLET | Freq: Three times a day (TID) | ORAL | 0 refills | Status: DC

## 2020-07-26 MED ORDER — METFORMIN HCL ER 750 MG PO TB24: 1500.0000 mg | ORAL_TABLET | Freq: Every day | ORAL | 0 refills | Status: DC

## 2020-07-27 LAB — COMPLETE METABOLIC PANEL WITH GFR
AG Ratio: 1.6 (calc) (ref 1.0–2.5)
ALT: 28 U/L (ref 9–46)
AST: 18 U/L (ref 10–35)
Albumin: 4.6 g/dL (ref 3.6–5.1)
Alkaline phosphatase (APISO): 67 U/L (ref 35–144)
BUN/Creatinine Ratio: 11 (calc) (ref 6–22)
BUN: 18 mg/dL (ref 7–25)
CO2: 26 mmol/L (ref 20–32)
Calcium: 10 mg/dL (ref 8.6–10.3)
Chloride: 104 mmol/L (ref 98–110)
Creat: 1.58 mg/dL — ABNORMAL HIGH (ref 0.70–1.25)
GFR, Est African American: 52 mL/min/{1.73_m2} — ABNORMAL LOW (ref >=60)
GFR, Est Non African American: 45 mL/min/{1.73_m2} — ABNORMAL LOW (ref >=60)
Globulin: 2.9 g/dL (calc) (ref 1.9–3.7)
Glucose, Bld: 203 mg/dL — ABNORMAL HIGH (ref 65–99)
Potassium: 4.8 mmol/L (ref 3.5–5.3)
Sodium: 138 mmol/L (ref 135–146)
Total Bilirubin: 0.4 mg/dL (ref 0.2–1.2)
Total Protein: 7.5 g/dL (ref 6.1–8.1)

## 2020-07-27 LAB — LIPID PANEL
Cholesterol: 150 mg/dL (ref <200)
HDL: 45 mg/dL (ref >=40)
LDL Cholesterol (Calc): 81 mg/dL (calc)
Non-HDL Cholesterol (Calc): 105 mg/dL (calc) (ref <130)
Total CHOL/HDL Ratio: 3.3 (calc) (ref <5.0)
Triglycerides: 144 mg/dL (ref <150)

## 2020-07-27 LAB — HEMOGLOBIN A1C
Hgb A1c MFr Bld: 9.4 % of total Hgb — ABNORMAL HIGH (ref <5.7)
Mean Plasma Glucose: 223 (calc)
eAG (mmol/L): 12.4 (calc)

## 2020-07-29 ENCOUNTER — Encounter: Payer: Self-pay | Admitting: Internal Medicine

## 2020-07-29 ENCOUNTER — Inpatient Hospital Stay: Payer: Medicare Other | Attending: Internal Medicine

## 2020-07-29 ENCOUNTER — Inpatient Hospital Stay (HOSPITAL_BASED_OUTPATIENT_CLINIC_OR_DEPARTMENT_OTHER): Payer: Medicare Other | Admitting: Internal Medicine

## 2020-07-29 ENCOUNTER — Other Ambulatory Visit: Payer: Self-pay

## 2020-07-29 VITALS — BP 110/83 | HR 84 | Temp 97.3°F | Resp 16 | Ht 64.0 in | Wt 168.0 lb

## 2020-07-29 DIAGNOSIS — Z7901 Long term (current) use of anticoagulants: Secondary | ICD-10-CM | POA: Diagnosis not present

## 2020-07-29 DIAGNOSIS — R5383 Other fatigue: Secondary | ICD-10-CM | POA: Insufficient documentation

## 2020-07-29 DIAGNOSIS — R413 Other amnesia: Secondary | ICD-10-CM | POA: Diagnosis not present

## 2020-07-29 DIAGNOSIS — R202 Paresthesia of skin: Secondary | ICD-10-CM | POA: Insufficient documentation

## 2020-07-29 DIAGNOSIS — Z79899 Other long term (current) drug therapy: Secondary | ICD-10-CM | POA: Insufficient documentation

## 2020-07-29 DIAGNOSIS — N1831 Chronic kidney disease, stage 3a: Secondary | ICD-10-CM | POA: Insufficient documentation

## 2020-07-29 DIAGNOSIS — I2699 Other pulmonary embolism without acute cor pulmonale: Secondary | ICD-10-CM | POA: Diagnosis not present

## 2020-07-29 DIAGNOSIS — Z86711 Personal history of pulmonary embolism: Secondary | ICD-10-CM | POA: Diagnosis not present

## 2020-07-29 DIAGNOSIS — R972 Elevated prostate specific antigen [PSA]: Secondary | ICD-10-CM | POA: Insufficient documentation

## 2020-07-29 DIAGNOSIS — C7951 Secondary malignant neoplasm of bone: Secondary | ICD-10-CM | POA: Insufficient documentation

## 2020-07-29 DIAGNOSIS — Z818 Family history of other mental and behavioral disorders: Secondary | ICD-10-CM | POA: Diagnosis not present

## 2020-07-29 DIAGNOSIS — N400 Enlarged prostate without lower urinary tract symptoms: Secondary | ICD-10-CM | POA: Insufficient documentation

## 2020-07-29 DIAGNOSIS — R200 Anesthesia of skin: Secondary | ICD-10-CM | POA: Insufficient documentation

## 2020-07-29 DIAGNOSIS — C3412 Malignant neoplasm of upper lobe, left bronchus or lung: Secondary | ICD-10-CM

## 2020-07-29 DIAGNOSIS — E785 Hyperlipidemia, unspecified: Secondary | ICD-10-CM | POA: Insufficient documentation

## 2020-07-29 DIAGNOSIS — Z8249 Family history of ischemic heart disease and other diseases of the circulatory system: Secondary | ICD-10-CM | POA: Insufficient documentation

## 2020-07-29 DIAGNOSIS — Z8042 Family history of malignant neoplasm of prostate: Secondary | ICD-10-CM | POA: Insufficient documentation

## 2020-07-29 DIAGNOSIS — R42 Dizziness and giddiness: Secondary | ICD-10-CM | POA: Insufficient documentation

## 2020-07-29 DIAGNOSIS — Z8673 Personal history of transient ischemic attack (TIA), and cerebral infarction without residual deficits: Secondary | ICD-10-CM | POA: Diagnosis not present

## 2020-07-29 DIAGNOSIS — C7931 Secondary malignant neoplasm of brain: Secondary | ICD-10-CM | POA: Diagnosis not present

## 2020-07-29 DIAGNOSIS — Z8719 Personal history of other diseases of the digestive system: Secondary | ICD-10-CM | POA: Insufficient documentation

## 2020-07-29 DIAGNOSIS — Z833 Family history of diabetes mellitus: Secondary | ICD-10-CM | POA: Diagnosis not present

## 2020-07-29 DIAGNOSIS — M2550 Pain in unspecified joint: Secondary | ICD-10-CM | POA: Diagnosis not present

## 2020-07-29 DIAGNOSIS — E1122 Type 2 diabetes mellitus with diabetic chronic kidney disease: Secondary | ICD-10-CM | POA: Diagnosis not present

## 2020-07-29 LAB — COMPREHENSIVE METABOLIC PANEL
ALT: 29 U/L (ref 0–44)
AST: 20 U/L (ref 15–41)
Albumin: 4 g/dL (ref 3.5–5.0)
Alkaline Phosphatase: 49 U/L (ref 38–126)
Anion gap: 9 (ref 5–15)
BUN: 21 mg/dL (ref 8–23)
CO2: 26 mmol/L (ref 22–32)
Calcium: 9.3 mg/dL (ref 8.9–10.3)
Chloride: 105 mmol/L (ref 98–111)
Creatinine, Ser: 1.56 mg/dL — ABNORMAL HIGH (ref 0.61–1.24)
GFR, Estimated: 49 mL/min — ABNORMAL LOW (ref >60)
Glucose, Bld: 157 mg/dL — ABNORMAL HIGH (ref 70–99)
Potassium: 4.5 mmol/L (ref 3.5–5.1)
Sodium: 140 mmol/L (ref 135–145)
Total Bilirubin: 0.9 mg/dL (ref 0.3–1.2)
Total Protein: 7.4 g/dL (ref 6.5–8.1)

## 2020-07-29 LAB — CBC WITH DIFFERENTIAL/PLATELET
Abs Immature Granulocytes: 0.01 10*3/uL (ref 0.00–0.07)
Basophils Absolute: 0 10*3/uL (ref 0.0–0.1)
Basophils Relative: 1 %
Eosinophils Absolute: 0.2 10*3/uL (ref 0.0–0.5)
Eosinophils Relative: 4 %
HCT: 41.1 % (ref 39.0–52.0)
Hemoglobin: 13.9 g/dL (ref 13.0–17.0)
Immature Granulocytes: 0 %
Lymphocytes Relative: 51 %
Lymphs Abs: 2.4 10*3/uL (ref 0.7–4.0)
MCH: 29.7 pg (ref 26.0–34.0)
MCHC: 33.8 g/dL (ref 30.0–36.0)
MCV: 87.8 fL (ref 80.0–100.0)
Monocytes Absolute: 0.6 10*3/uL (ref 0.1–1.0)
Monocytes Relative: 13 %
Neutro Abs: 1.5 10*3/uL — ABNORMAL LOW (ref 1.7–7.7)
Neutrophils Relative %: 31 %
Platelets: 198 10*3/uL (ref 150–400)
RBC: 4.68 MIL/uL (ref 4.22–5.81)
RDW: 14.9 % (ref 11.5–15.5)
WBC: 4.8 10*3/uL (ref 4.0–10.5)
nRBC: 0 % (ref 0.0–0.2)

## 2020-07-29 MED ORDER — RIVAROXABAN 20 MG PO TABS: ORAL_TABLET | ORAL | 6 refills | Status: DC

## 2020-07-29 NOTE — Assessment & Plan Note (Signed)
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   # Dizziness- subjective;s/p ENT evaluation; MRI brain Oct 2021- STABLE lesions- STABLE>   # Bilateral PE & left lower extremity DVT:  on Xarelto [refill]-STABLE.   # CKD stage III-creatinine 1.7 [GFR~49]-STABLE.   # Peripheral Neuropathy- ? DM;continue  gabapentin 200 mg qhs-STABLE.   # Prostatism- on flomax/finasteride  STABLE. Reviewed the urology note.   # DISPOSITION:   follow up in 4 weeks MD;labs- cbc/cmp;CT C/A/P --Dr.B

## 2020-07-29 NOTE — Progress Notes (Signed)
[place] OFFICE PROGRESS NOTE  Patient Care Team: [name], [name], MD as Medical Oncologist (Medical Oncology) Cathi Roan, [facility] (Pharmacist)  Cancer Staging No matching staging information was found for the patient.   Oncology History Overview Note  # OCT 2017- ADENO CA LUNG; STAGE IV [; LUL; bil supraclavicular LN; Left neck LN Bx]; ROS-1 MUTATED; s/p Carbo-alimta x1[oct 2017]  # NOV 1st 2017- XALKORI 250 mg BID; JAN 15th CT- PR;  # AUG 27th Chemo-RT to persistent LUL/mediastinal LN [s/p carbo-taxol- with RT; finished Sep 22nd 2018];   # July 14th 2020- multiple metastatic lesions of the brain [July 30 whole brain radiation finished April 21, 2019]; bone scan skull metastases /posterior left ninth rib metastases; April 04, 2019 stopped crizotinib;  #April 25, 2019-start Entrectinib 600 mg once a day; STopped in NOV 2020 [dizziness]; NOV 2020-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   # LLE DVT/bil PE/Multiple strokes [? On xarelto]-Lovenox; Jan mid 2018- xarelto [lovenox-insurance issues]  # MRI brain- multiple infarcts [2d echo/bubble study-NEG's/p Neurology eval]  ------------------------------------------------------------    # # s/p TURP [Sep 2017; Dr.Cope] DEC 26th CT- distended bladder;  # Elevated PSA-  FEB 2021- 12;  S/p Bx- Negative; follow with urology/Dr.Stoioff   # MOLECULAR TESTING- ROS-1 POSITIVE; ALK/EGFR-NEG; PDL-1 EXPRESSION- 90%** [HIGH]  # DEC 15th 2020- PALLIATIVE CARE -----------------------------------------------------------------    DIAGNOSIS: Adenocarcinoma the lung  ROS-1 +  STAGE:  IV    ;GOALS: Palliative  CURRENT/MOST RECENT  THERAPY- Avastin [C]   Primary cancer of left upper lobe of lung ([place])  08/09/2019 -  Chemotherapy   The patient had bevacizumab-awwb (MVASI) 800 mg in sodium chloride 0.9 % 100 mL chemo infusion, 725 mg, Intravenous,  Once, 6 of 6 cycles Dose modification: 10 mg/kg (original dose 10 mg/kg, Cycle 3, Reason: Other (see comments), Comment: weight change) Administration: 800 mg (08/09/2019), 800 mg (08/22/2019), 700 mg (09/19/2019), 700 mg (10/04/2019), 700 mg (10/18/2019), 700 mg (11/01/2019)  for chemotherapy treatment.        INTERVAL HISTORY:  Melvin Willis 69 y.o.  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  No falls.  No worsening shortness of breath or cough.  No chest pain.  Continues to complain of mild tingling and numbness in the extremities.  Not any worse.  As per wife patient having memory lapses.  Review of Systems  Constitutional: Positive for malaise/fatigue. Negative for chills, diaphoresis, fever and weight loss.  HENT: Negative for nosebleeds and sore throat.   Eyes: Negative for double vision.  Respiratory: Negative for cough, hemoptysis, sputum production, shortness of breath and wheezing.   Cardiovascular: Negative for chest pain (Left upper chest wall pain chronic), palpitations and orthopnea.  Gastrointestinal: Negative for abdominal pain, blood in stool, constipation, diarrhea, heartburn and melena.  Genitourinary: Negative for dysuria.  Musculoskeletal: Positive for joint pain. Negative for back pain.  Skin: Negative.  Negative for itching and rash.  Neurological: Positive for tingling. Negative for focal weakness and weakness.  Endo/Heme/Allergies: Does not bruise/bleed easily.  Psychiatric/Behavioral: Positive for memory loss. Negative for depression. The patient is not nervous/anxious and does not have insomnia.  PAST MEDICAL HISTORY :  Past Medical  History:  Diagnosis Date  . Abnormal prostate specific antigen 08/08/2012  . Adiposity 04/16/2015  . Chronic kidney disease (CKD), stage III (moderate) ([place]) 11/27/2016  . CVA (cerebral vascular accident) ([place]) 06/17/2016  . Diabetes mellitus without complication ([place])   . Diverticulosis of sigmoid colon 04/16/2015  . Dyslipidemia 03/18/2015  . Hemorrhoids, internal 04/16/2015  . Hypercholesteremia 04/16/2015  . Hyperlipidemia   . Hypertension   . Primary cancer of left upper lobe of lung ([place])   . Pulmonary embolism ([place])   . Wears dentures    partial upper    PAST SURGICAL HISTORY :   Past Surgical History:  Procedure Laterality Date  . COLONOSCOPY    . COLONOSCOPY WITH PROPOFOL N/A 05/06/2015   Procedure: COLONOSCOPY WITH PROPOFOL;  Surgeon: Lucilla Lame, MD;  Location: [place];  Service: Endoscopy;  Laterality: N/A;  ASCENDING COLON POLYPS X 2 TERMINAL ILEUM BIOPSY RANDOM COLON BX. TRANSVERSE COLON POLYP SIGMOID COLON POLYP  . ESOPHAGOGASTRODUODENOSCOPY (EGD) WITH PROPOFOL N/A 05/06/2015   Procedure: ESOPHAGOGASTRODUODENOSCOPY (EGD) WITH PROPOFOL;  Surgeon: Lucilla Lame, MD;  Location: [place];  Service: Endoscopy;  Laterality: N/A;  GASTRIC BIOPSY X1  . KIDNEY STONE SURGERY  2017    FAMILY HISTORY :   Family History  Problem Relation Age of Onset  . Diabetes Mother   . Diabetes Father   . CAD Father   . Dementia Father   . Diabetes Sister   . Cancer Maternal Uncle        Prostate  . Cancer Cousin        prostate    SOCIAL HISTORY:   Social History   Tobacco Use  . Smoking status: Never Smoker  . Smokeless tobacco: Never Used  Vaping Use  . Vaping Use: Never used  Substance Use Topics  . Alcohol use: No    Alcohol/week: 0.0 standard drinks  . Drug use: No    ALLERGIES:  has No Known Allergies.  MEDICATIONS:  Current Outpatient Medications  Medication Sig Dispense Refill  . atorvastatin (LIPITOR) 40 MG tablet Take 1 tablet (40 mg total)  by mouth 3 (three) times a week. 48 tablet 1  . blood glucose meter kit and supplies Dispense based on patient and insurance preference (Accuchek Aviva Plus, Accuchek Nano). Use up to four times daily as directed. (FOR ICD-10 E10.9, E11.9). 1 each 0  . entrectinib (ROZLYTREK) 200 MG capsule Take 3 capsules (600 mg total) by mouth daily. (Patient taking differently: Take 600 mg by mouth daily. Pt reports taking 2 pills daily per MD) 90 capsule 6  . finasteride (PROSCAR) 5 MG tablet Take 1 tablet (5 mg total) by mouth daily. 90 tablet 3  . gabapentin (NEURONTIN) 300 MG capsule Take 1 capsule by mouth at bedtime.    . meclizine (ANTIVERT) 25 MG tablet Take 1 tablet (25 mg total) by mouth 3 (three) times daily as needed for dizziness. 45 tablet 0  . metFORMIN (GLUCOPHAGE-XR) 750 MG 24 hr tablet Take 2 tablets (1,500 mg total) by mouth daily. When out of 500 mg pills 180 tablet 0  . nateglinide (STARLIX) 60 MG tablet Take 1 tablet (60 mg total) by mouth 3 (three) times daily with meals. 90 tablet 0  . ondansetron (ZOFRAN) 8 MG tablet One pill every 8 hours as needed for nausea/vomitting. 40 tablet 1  . rivaroxaban (XARELTO) 20 MG TABS tablet TAKE 1 TABLET BY MOUTH ONCE DAILY WITH SUPPER 30  tablet 6  . tamsulosin (FLOMAX) 0.4 MG CAPS capsule Take 1 capsule by mouth once daily in the morning 90 capsule 3   No current facility-administered medications for this visit.    PHYSICAL EXAMINATION: ECOG PERFORMANCE STATUS: 0 - Asymptomatic  BP 110/83 (BP Location: Left Arm, Patient Position: Sitting, Cuff Size: Normal)   Pulse 84   Temp (!) 97.3 F (36.3 C) (Tympanic)   Resp 16   Ht '5\' 4"'  (1.626 m)   Wt 168 lb (76.2 kg)   SpO2 97%   BMI 28.84 kg/m   Filed Weights   07/29/20 0916  Weight: 168 lb (76.2 kg)    Physical Exam Constitutional:      Comments: Patient is accompanied by his wife.  He is walking by himself.  HENT:     Head: Normocephalic and atraumatic.     Mouth/Throat:     Pharynx:  No oropharyngeal exudate.  Eyes:     Pupils: Pupils are equal, round, and reactive to light.  Cardiovascular:     Rate and Rhythm: Normal rate and regular rhythm.  Pulmonary:     Effort: No respiratory distress.     Breath sounds: No wheezing.     Comments: Decreased breath sounds bilaterally no wheeze or crackles. Abdominal:     General: Bowel sounds are normal. There is no distension.     Palpations: Abdomen is soft. There is no mass.     Tenderness: There is no abdominal tenderness. There is no guarding or rebound.  Musculoskeletal:        General: No tenderness. Normal range of motion.     Cervical back: Normal range of motion and neck supple.  Skin:    General: Skin is warm.  Neurological:     Mental Status: He is alert and oriented to person, place, and time.  Psychiatric:        Mood and Affect: Affect normal.    LABORATORY DATA:  I have reviewed the data as listed    Component Value Date/Time   NA 140 07/29/2020 0901   NA 139 01/01/2016 1000   K 4.5 07/29/2020 0901   CL 105 07/29/2020 0901   CO2 26 07/29/2020 0901   GLUCOSE 157 (H) 07/29/2020 0901   BUN 21 07/29/2020 0901   BUN 13 01/01/2016 1000   CREATININE 1.56 (H) 07/29/2020 0901   CREATININE 1.58 (H) 07/26/2020 1014   CALCIUM 9.3 07/29/2020 0901   PROT 7.4 07/29/2020 0901   PROT 7.2 01/01/2016 1000   ALBUMIN 4.0 07/29/2020 0901   ALBUMIN 4.4 01/01/2016 1000   AST 20 07/29/2020 0901   ALT 29 07/29/2020 0901   ALKPHOS 49 07/29/2020 0901   BILITOT 0.9 07/29/2020 0901   BILITOT 0.4 01/01/2016 1000   GFRNONAA 49 (L) 07/29/2020 0901   GFRNONAA 45 (L) 07/26/2020 1014   GFRAA 52 (L) 07/26/2020 1014    No results found for: SPEP, UPEP  Lab Results  Component Value Date   WBC 4.8 07/29/2020   NEUTROABS 1.5 (L) 07/29/2020   HGB 13.9 07/29/2020   HCT 41.1 07/29/2020   MCV 87.8 07/29/2020   PLT 198 07/29/2020      Chemistry      Component Value Date/Time   NA 140 07/29/2020 0901   NA 139 01/01/2016  1000   K 4.5 07/29/2020 0901   CL 105 07/29/2020 0901   CO2 26 07/29/2020 0901   BUN 21 07/29/2020 0901   BUN 13 01/01/2016 1000   CREATININE  1.56 (H) 07/29/2020 0901   CREATININE 1.58 (H) 07/26/2020 1014      Component Value Date/Time   CALCIUM 9.3 07/29/2020 0901   ALKPHOS 49 07/29/2020 0901   AST 20 07/29/2020 0901   ALT 29 07/29/2020 0901   BILITOT 0.9 07/29/2020 0901   BILITOT 0.4 01/01/2016 1000       RADIOGRAPHIC STUDIES: I have personally reviewed the radiological images as listed and agreed with the findings in the report. No results found.   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   # Dizziness- subjective;s/p ENT evaluation; MRI brain Oct 2021- STABLE lesions- STABLE>   # Bilateral PE & left lower extremity DVT:  on Xarelto [refill]-STABLE.   # CKD stage III-creatinine 1.7 [GFR~49]-STABLE.   # Peripheral Neuropathy- ? DM;continue  gabapentin 200 mg qhs-STABLE.   # Prostatism- on flomax/finasteride  STABLE. Reviewed the urology note.   # DISPOSITION:   follow up in 4 weeks MD;labs- cbc/cmp;CT C/A/P --Dr.B        Orders Placed This Encounter  Procedures  . CT CHEST ABDOMEN PELVIS WO CONTRAST    Standing Status:   Future    Standing Expiration Date:   07/29/2021    Order Specific Question:   If indicated for the ordered procedure, I authorize the administration of contrast media per Radiology protocol    Answer:   Yes    Order Specific Question:   Preferred imaging location?    Answer:   [facility]    Order Specific Question:   Is Oral Contrast requested for this exam?    Answer:   Yes, Per Radiology protocol    Order Specific Question:   Reason for Exam (SYMPTOM  OR DIAGNOSIS  REQUIRED)    Answer:   lung cancer   All questions were answered. The patient knows to call the clinic with any problems, questions or concerns.      Cammie Sickle, MD 08/05/2020 7:13 AM

## 2020-07-31 ENCOUNTER — Other Ambulatory Visit: Payer: Self-pay | Admitting: Family Medicine

## 2020-08-08 ENCOUNTER — Other Ambulatory Visit: Payer: Self-pay

## 2020-08-08 ENCOUNTER — Telehealth (INDEPENDENT_AMBULATORY_CARE_PROVIDER_SITE_OTHER): Payer: Self-pay | Admitting: Gastroenterology

## 2020-08-08 DIAGNOSIS — Z86010 Personal history of colonic polyps: Secondary | ICD-10-CM

## 2020-08-08 MED ORDER — NA SULFATE-K SULFATE-MG SULF 17.5-3.13-1.6 GM/177ML PO SOLN: 1.0000 | Freq: Once | ORAL | 0 refills | Status: AC

## 2020-08-08 NOTE — Progress Notes (Signed)
Gastroenterology Pre-Procedure Review  Request Date: Tuesday 09/10/20 Requesting Physician: Dr. Allen Norris  PATIENT REVIEW QUESTIONS: The patient responded to the following health history questions as indicated:    1. Are you having any GI issues? no 2. Do you have a personal history of Polyps? yes (05/06/15 colonoscopy performed by Dr. Allen Norris noted polyps) 3. Do you have a family history of Colon Cancer or Polyps? no 4. Diabetes Mellitus? yes (type 2) 5. Joint replacements in the past 12 months?no 6. Major health problems in the past 3 months?no 7. Any artificial heart valves, MVP, or defibrillator?no    MEDICATIONS & ALLERGIES:    Patient reports the following regarding taking any anticoagulation/antiplatelet therapy:   Plavix, Coumadin, Eliquis, Xarelto, Lovenox, Pradaxa, Brilinta, or Effient? yes (Xarelto blood thinner sent to Dr. Ignacia Marvel) Aspirin? no  Patient confirms/reports the following medications:  Current Outpatient Medications  Medication Sig Dispense Refill  . atorvastatin (LIPITOR) 40 MG tablet Take 1 tablet (40 mg total) by mouth 3 (three) times a week. 48 tablet 1  . blood glucose meter kit and supplies Dispense based on patient and insurance preference (Accuchek Aviva Plus, Accuchek Nano). Use up to four times daily as directed. (FOR ICD-10 E10.9, E11.9). 1 each 0  . entrectinib (ROZLYTREK) 200 MG capsule Take 3 capsules (600 mg total) by mouth daily. (Patient taking differently: Take 600 mg by mouth daily. Pt reports taking 2 pills daily per MD) 90 capsule 6  . finasteride (PROSCAR) 5 MG tablet Take 1 tablet (5 mg total) by mouth daily. 90 tablet 3  . gabapentin (NEURONTIN) 300 MG capsule Take 1 capsule by mouth at bedtime.    . metFORMIN (GLUCOPHAGE-XR) 750 MG 24 hr tablet Take 2 tablets (1,500 mg total) by mouth daily. When out of 500 mg pills 180 tablet 0  . rivaroxaban (XARELTO) 20 MG TABS tablet TAKE 1 TABLET BY MOUTH ONCE DAILY WITH SUPPER 30 tablet 6  . tamsulosin  (FLOMAX) 0.4 MG CAPS capsule Take 1 capsule by mouth once daily in the morning 90 capsule 3  . meclizine (ANTIVERT) 25 MG tablet Take 1 tablet (25 mg total) by mouth 3 (three) times daily as needed for dizziness. (Patient not taking: Reported on 08/08/2020) 45 tablet 0  . nateglinide (STARLIX) 60 MG tablet Take 1 tablet (60 mg total) by mouth 3 (three) times daily with meals. (Patient not taking: Reported on 08/08/2020) 90 tablet 0  . ondansetron (ZOFRAN) 8 MG tablet One pill every 8 hours as needed for nausea/vomitting. (Patient not taking: Reported on 08/08/2020) 40 tablet 1   No current facility-administered medications for this visit.    Patient confirms/reports the following allergies:  No Known Allergies  No orders of the defined types were placed in this encounter.   AUTHORIZATION INFORMATION Primary Insurance: 1D#: Group #:  Secondary Insurance: 1D#: Group #:  SCHEDULE INFORMATION: Date: Tuesday 09/10/20 Time: Location:[facility]

## 2020-08-19 ENCOUNTER — Other Ambulatory Visit: Payer: Self-pay

## 2020-08-19 ENCOUNTER — Ambulatory Visit
Admission: RE | Admit: 2020-08-19 | Discharge: 2020-08-19 | Disposition: A | Discharge status: Home / Self Care | Payer: Medicare Other | Source: Ambulatory Visit | Attending: Internal Medicine | Admitting: Internal Medicine

## 2020-08-19 DIAGNOSIS — J984 Other disorders of lung: Secondary | ICD-10-CM | POA: Diagnosis not present

## 2020-08-19 DIAGNOSIS — I2510 Atherosclerotic heart disease of native coronary artery without angina pectoris: Secondary | ICD-10-CM | POA: Diagnosis not present

## 2020-08-19 DIAGNOSIS — C3412 Malignant neoplasm of upper lobe, left bronchus or lung: Secondary | ICD-10-CM

## 2020-08-19 DIAGNOSIS — N281 Cyst of kidney, acquired: Secondary | ICD-10-CM | POA: Diagnosis not present

## 2020-08-19 DIAGNOSIS — K7689 Other specified diseases of liver: Secondary | ICD-10-CM | POA: Diagnosis not present

## 2020-08-19 DIAGNOSIS — N400 Enlarged prostate without lower urinary tract symptoms: Secondary | ICD-10-CM | POA: Diagnosis not present

## 2020-08-19 DIAGNOSIS — J8410 Pulmonary fibrosis, unspecified: Secondary | ICD-10-CM | POA: Diagnosis not present

## 2020-08-19 DIAGNOSIS — C3490 Malignant neoplasm of unspecified part of unspecified bronchus or lung: Secondary | ICD-10-CM | POA: Diagnosis not present

## 2020-08-19 DIAGNOSIS — N3289 Other specified disorders of bladder: Secondary | ICD-10-CM | POA: Diagnosis not present

## 2020-08-20 ENCOUNTER — Inpatient Hospital Stay: Payer: Medicare Other | Attending: Hospice and Palliative Medicine | Admitting: Hospice and Palliative Medicine

## 2020-08-20 DIAGNOSIS — R413 Other amnesia: Secondary | ICD-10-CM | POA: Insufficient documentation

## 2020-08-20 DIAGNOSIS — R42 Dizziness and giddiness: Secondary | ICD-10-CM | POA: Insufficient documentation

## 2020-08-20 DIAGNOSIS — Z833 Family history of diabetes mellitus: Secondary | ICD-10-CM | POA: Insufficient documentation

## 2020-08-20 DIAGNOSIS — C7951 Secondary malignant neoplasm of bone: Secondary | ICD-10-CM | POA: Insufficient documentation

## 2020-08-20 DIAGNOSIS — Z8719 Personal history of other diseases of the digestive system: Secondary | ICD-10-CM | POA: Insufficient documentation

## 2020-08-20 DIAGNOSIS — Z7901 Long term (current) use of anticoagulants: Secondary | ICD-10-CM | POA: Insufficient documentation

## 2020-08-20 DIAGNOSIS — Z7984 Long term (current) use of oral hypoglycemic drugs: Secondary | ICD-10-CM | POA: Insufficient documentation

## 2020-08-20 DIAGNOSIS — C3412 Malignant neoplasm of upper lobe, left bronchus or lung: Secondary | ICD-10-CM | POA: Insufficient documentation

## 2020-08-20 DIAGNOSIS — R202 Paresthesia of skin: Secondary | ICD-10-CM | POA: Insufficient documentation

## 2020-08-20 DIAGNOSIS — Z79899 Other long term (current) drug therapy: Secondary | ICD-10-CM | POA: Insufficient documentation

## 2020-08-20 DIAGNOSIS — G629 Polyneuropathy, unspecified: Secondary | ICD-10-CM | POA: Insufficient documentation

## 2020-08-20 DIAGNOSIS — M2550 Pain in unspecified joint: Secondary | ICD-10-CM | POA: Insufficient documentation

## 2020-08-20 DIAGNOSIS — Z8249 Family history of ischemic heart disease and other diseases of the circulatory system: Secondary | ICD-10-CM | POA: Insufficient documentation

## 2020-08-20 DIAGNOSIS — R972 Elevated prostate specific antigen [PSA]: Secondary | ICD-10-CM | POA: Insufficient documentation

## 2020-08-20 DIAGNOSIS — Z515 Encounter for palliative care: Secondary | ICD-10-CM

## 2020-08-20 DIAGNOSIS — C7931 Secondary malignant neoplasm of brain: Secondary | ICD-10-CM | POA: Insufficient documentation

## 2020-08-20 DIAGNOSIS — E785 Hyperlipidemia, unspecified: Secondary | ICD-10-CM | POA: Insufficient documentation

## 2020-08-20 DIAGNOSIS — Z8042 Family history of malignant neoplasm of prostate: Secondary | ICD-10-CM | POA: Insufficient documentation

## 2020-08-20 DIAGNOSIS — Z8673 Personal history of transient ischemic attack (TIA), and cerebral infarction without residual deficits: Secondary | ICD-10-CM | POA: Insufficient documentation

## 2020-08-20 DIAGNOSIS — R200 Anesthesia of skin: Secondary | ICD-10-CM | POA: Insufficient documentation

## 2020-08-20 DIAGNOSIS — N400 Enlarged prostate without lower urinary tract symptoms: Secondary | ICD-10-CM | POA: Insufficient documentation

## 2020-08-20 DIAGNOSIS — Z818 Family history of other mental and behavioral disorders: Secondary | ICD-10-CM | POA: Insufficient documentation

## 2020-08-20 DIAGNOSIS — R5383 Other fatigue: Secondary | ICD-10-CM | POA: Insufficient documentation

## 2020-08-20 DIAGNOSIS — I82402 Acute embolism and thrombosis of unspecified deep veins of left lower extremity: Secondary | ICD-10-CM | POA: Insufficient documentation

## 2020-08-20 DIAGNOSIS — Z86711 Personal history of pulmonary embolism: Secondary | ICD-10-CM | POA: Insufficient documentation

## 2020-08-20 DIAGNOSIS — N183 Chronic kidney disease, stage 3 unspecified: Secondary | ICD-10-CM | POA: Insufficient documentation

## 2020-08-20 DIAGNOSIS — E1122 Type 2 diabetes mellitus with diabetic chronic kidney disease: Secondary | ICD-10-CM | POA: Insufficient documentation

## 2020-08-20 NOTE — Progress Notes (Signed)
I was unable to reach patient by phone for scheduled MyChart visit.  Will reschedule.

## 2020-08-26 ENCOUNTER — Inpatient Hospital Stay (HOSPITAL_BASED_OUTPATIENT_CLINIC_OR_DEPARTMENT_OTHER): Payer: Medicare Other | Admitting: Internal Medicine

## 2020-08-26 ENCOUNTER — Inpatient Hospital Stay: Payer: Medicare Other

## 2020-08-26 ENCOUNTER — Other Ambulatory Visit: Payer: Self-pay

## 2020-08-26 DIAGNOSIS — E1122 Type 2 diabetes mellitus with diabetic chronic kidney disease: Secondary | ICD-10-CM | POA: Diagnosis not present

## 2020-08-26 DIAGNOSIS — Z86711 Personal history of pulmonary embolism: Secondary | ICD-10-CM | POA: Diagnosis not present

## 2020-08-26 DIAGNOSIS — N400 Enlarged prostate without lower urinary tract symptoms: Secondary | ICD-10-CM | POA: Diagnosis not present

## 2020-08-26 DIAGNOSIS — Z7984 Long term (current) use of oral hypoglycemic drugs: Secondary | ICD-10-CM | POA: Diagnosis not present

## 2020-08-26 DIAGNOSIS — N183 Chronic kidney disease, stage 3 unspecified: Secondary | ICD-10-CM | POA: Diagnosis not present

## 2020-08-26 DIAGNOSIS — I82402 Acute embolism and thrombosis of unspecified deep veins of left lower extremity: Secondary | ICD-10-CM | POA: Diagnosis not present

## 2020-08-26 DIAGNOSIS — Z8249 Family history of ischemic heart disease and other diseases of the circulatory system: Secondary | ICD-10-CM | POA: Diagnosis not present

## 2020-08-26 DIAGNOSIS — Z79899 Other long term (current) drug therapy: Secondary | ICD-10-CM | POA: Diagnosis not present

## 2020-08-26 DIAGNOSIS — R42 Dizziness and giddiness: Secondary | ICD-10-CM | POA: Diagnosis not present

## 2020-08-26 DIAGNOSIS — R972 Elevated prostate specific antigen [PSA]: Secondary | ICD-10-CM | POA: Diagnosis not present

## 2020-08-26 DIAGNOSIS — Z833 Family history of diabetes mellitus: Secondary | ICD-10-CM | POA: Diagnosis not present

## 2020-08-26 DIAGNOSIS — C7931 Secondary malignant neoplasm of brain: Secondary | ICD-10-CM | POA: Diagnosis not present

## 2020-08-26 DIAGNOSIS — C3412 Malignant neoplasm of upper lobe, left bronchus or lung: Secondary | ICD-10-CM

## 2020-08-26 DIAGNOSIS — Z8673 Personal history of transient ischemic attack (TIA), and cerebral infarction without residual deficits: Secondary | ICD-10-CM | POA: Diagnosis not present

## 2020-08-26 DIAGNOSIS — R202 Paresthesia of skin: Secondary | ICD-10-CM | POA: Diagnosis not present

## 2020-08-26 DIAGNOSIS — R200 Anesthesia of skin: Secondary | ICD-10-CM | POA: Diagnosis not present

## 2020-08-26 DIAGNOSIS — Z7901 Long term (current) use of anticoagulants: Secondary | ICD-10-CM | POA: Diagnosis not present

## 2020-08-26 DIAGNOSIS — Z8719 Personal history of other diseases of the digestive system: Secondary | ICD-10-CM | POA: Diagnosis not present

## 2020-08-26 DIAGNOSIS — M2550 Pain in unspecified joint: Secondary | ICD-10-CM | POA: Diagnosis not present

## 2020-08-26 DIAGNOSIS — C7951 Secondary malignant neoplasm of bone: Secondary | ICD-10-CM | POA: Diagnosis not present

## 2020-08-26 DIAGNOSIS — R5383 Other fatigue: Secondary | ICD-10-CM | POA: Diagnosis not present

## 2020-08-26 DIAGNOSIS — R413 Other amnesia: Secondary | ICD-10-CM | POA: Diagnosis not present

## 2020-08-26 DIAGNOSIS — G629 Polyneuropathy, unspecified: Secondary | ICD-10-CM | POA: Diagnosis not present

## 2020-08-26 DIAGNOSIS — E785 Hyperlipidemia, unspecified: Secondary | ICD-10-CM | POA: Diagnosis not present

## 2020-08-26 LAB — CBC WITH DIFFERENTIAL/PLATELET
Abs Immature Granulocytes: 0.02 10*3/uL (ref 0.00–0.07)
Basophils Absolute: 0 10*3/uL (ref 0.0–0.1)
Basophils Relative: 1 %
Eosinophils Absolute: 0.2 10*3/uL (ref 0.0–0.5)
Eosinophils Relative: 4 %
HCT: 41.1 % (ref 39.0–52.0)
Hemoglobin: 14.1 g/dL (ref 13.0–17.0)
Immature Granulocytes: 0 %
Lymphocytes Relative: 45 %
Lymphs Abs: 2.5 10*3/uL (ref 0.7–4.0)
MCH: 30.1 pg (ref 26.0–34.0)
MCHC: 34.3 g/dL (ref 30.0–36.0)
MCV: 87.8 fL (ref 80.0–100.0)
Monocytes Absolute: 0.6 10*3/uL (ref 0.1–1.0)
Monocytes Relative: 12 %
Neutro Abs: 2.1 10*3/uL (ref 1.7–7.7)
Neutrophils Relative %: 38 %
Platelets: 208 10*3/uL (ref 150–400)
RBC: 4.68 MIL/uL (ref 4.22–5.81)
RDW: 14.6 % (ref 11.5–15.5)
WBC: 5.4 10*3/uL (ref 4.0–10.5)
nRBC: 0 % (ref 0.0–0.2)

## 2020-08-26 LAB — COMPREHENSIVE METABOLIC PANEL
ALT: 30 U/L (ref 0–44)
AST: 23 U/L (ref 15–41)
Albumin: 4.2 g/dL (ref 3.5–5.0)
Alkaline Phosphatase: 51 U/L (ref 38–126)
Anion gap: 11 (ref 5–15)
BUN: 18 mg/dL (ref 8–23)
CO2: 25 mmol/L (ref 22–32)
Calcium: 9.6 mg/dL (ref 8.9–10.3)
Chloride: 102 mmol/L (ref 98–111)
Creatinine, Ser: 1.59 mg/dL — ABNORMAL HIGH (ref 0.61–1.24)
GFR, Estimated: 47 mL/min — ABNORMAL LOW (ref >60)
Glucose, Bld: 161 mg/dL — ABNORMAL HIGH (ref 70–99)
Potassium: 4.3 mmol/L (ref 3.5–5.1)
Sodium: 138 mmol/L (ref 135–145)
Total Bilirubin: 0.9 mg/dL (ref 0.3–1.2)
Total Protein: 7.4 g/dL (ref 6.5–8.1)

## 2020-08-26 NOTE — Progress Notes (Signed)
[place] OFFICE PROGRESS NOTE  Patient Care Team: [name], [name], MD as Medical Oncologist (Medical Oncology) Cathi Roan, [facility] (Pharmacist)  Cancer Staging No matching staging information was found for the patient.   Oncology History Overview Note  # OCT 2017- ADENO CA LUNG; STAGE IV [; LUL; bil supraclavicular LN; Left neck LN Bx]; ROS-1 MUTATED; s/p Carbo-alimta x1[oct 2017]  # NOV 1st 2017- XALKORI 250 mg BID; JAN 15th CT- PR;  # AUG 27th Chemo-RT to persistent LUL/mediastinal LN [s/p carbo-taxol- with RT; finished Sep 22nd 2018];   # July 14th 2020- multiple metastatic lesions of the brain [July 30 whole brain radiation finished April 21, 2019]; bone scan skull metastases /posterior left ninth rib metastases; April 04, 2019 stopped crizotinib;  #April 25, 2019-start Entrectinib 600 mg once a day; STopped in NOV 2020 [dizziness]; NOV 2020-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   # LLE DVT/bil PE/Multiple strokes [? On xarelto]-Lovenox; Jan mid 2018- xarelto [lovenox-insurance issues]  # MRI brain- multiple infarcts [2d echo/bubble study-NEG's/p Neurology eval]  ------------------------------------------------------------    # # s/p TURP [Sep 2017; Dr.Cope] DEC 26th CT- distended bladder;  # Elevated PSA-  FEB 2021- 12;  S/p Bx- Negative; follow with urology/Dr.Stoioff   # MOLECULAR TESTING- ROS-1 POSITIVE; ALK/EGFR-NEG; PDL-1 EXPRESSION- 90%** [HIGH]  # DEC 15th 2020- PALLIATIVE CARE -----------------------------------------------------------------    DIAGNOSIS: Adenocarcinoma the lung  ROS-1 +  STAGE:  IV    ;GOALS: Palliative  CURRENT/MOST RECENT  THERAPY- Avastin [C]   Primary cancer of left upper lobe of lung ([place])  08/09/2019 -  Chemotherapy   The patient had bevacizumab-awwb (MVASI) 800 mg in sodium chloride 0.9 % 100 mL chemo infusion, 725 mg, Intravenous,  Once, 6 of 6 cycles Dose modification: 10 mg/kg (original dose 10 mg/kg, Cycle 3, Reason: Other (see comments), Comment: weight change) Administration: 800 mg (08/09/2019), 800 mg (08/22/2019), 700 mg (09/19/2019), 700 mg (10/04/2019), 700 mg (10/18/2019), 700 mg (11/01/2019)  for chemotherapy treatment.        INTERVAL HISTORY:  Melvin Willis 69 y.o.  male pleasant patient above history of metastatic lung cancer-to brain Ros-1 positive-currently on Rozyltrk [2 pills] s here for follow-up/review results of the CT scan..  Patient interim has been evaluated by PCP.  He is recommended to have screening colonoscopy/GI evaluation.  No nausea no vomiting.  No shortness of breath or cough.  Complains of chronic mild tingling and numbness in extremities.  Review of Systems  Constitutional: Positive for malaise/fatigue. Negative for chills, diaphoresis, fever and weight loss.  HENT: Negative for nosebleeds and sore throat.   Eyes: Negative for double vision.  Respiratory: Negative for cough, hemoptysis, sputum production, shortness of breath and wheezing.   Cardiovascular: Negative for chest pain (Left upper chest wall pain chronic), palpitations and orthopnea.  Gastrointestinal: Negative for abdominal pain, blood in stool, constipation, diarrhea, heartburn and melena.  Genitourinary: Negative for dysuria.  Musculoskeletal: Positive for joint pain. Negative for back pain.  Skin: Negative.  Negative for itching and rash.  Neurological: Positive for tingling. Negative for focal weakness and weakness.  Endo/Heme/Allergies: Does not bruise/bleed easily.  Psychiatric/Behavioral: Positive for memory loss. Negative for depression. The patient is not nervous/anxious and does not  have  insomnia.     PAST MEDICAL HISTORY :  Past Medical History:  Diagnosis Date  . Abnormal prostate specific antigen 08/08/2012  . Adiposity 04/16/2015  . Chronic kidney disease (CKD), stage III (moderate) ([place]) 11/27/2016  . CVA (cerebral vascular accident) ([place]) 06/17/2016  . Diabetes mellitus without complication ([place])   . Diverticulosis of sigmoid colon 04/16/2015  . Dyslipidemia 03/18/2015  . Hemorrhoids, internal 04/16/2015  . Hypercholesteremia 04/16/2015  . Hyperlipidemia   . Hypertension   . Primary cancer of left upper lobe of lung ([place])   . Pulmonary embolism ([place])   . Wears dentures    partial upper    PAST SURGICAL HISTORY :   Past Surgical History:  Procedure Laterality Date  . COLONOSCOPY    . COLONOSCOPY WITH PROPOFOL N/A 05/06/2015   Procedure: COLONOSCOPY WITH PROPOFOL;  Surgeon: Lucilla Lame, MD;  Location: [place];  Service: Endoscopy;  Laterality: N/A;  ASCENDING COLON POLYPS X 2 TERMINAL ILEUM BIOPSY RANDOM COLON BX. TRANSVERSE COLON POLYP SIGMOID COLON POLYP  . ESOPHAGOGASTRODUODENOSCOPY (EGD) WITH PROPOFOL N/A 05/06/2015   Procedure: ESOPHAGOGASTRODUODENOSCOPY (EGD) WITH PROPOFOL;  Surgeon: Lucilla Lame, MD;  Location: [facility];  Service: Endoscopy;  Laterality: N/A;  GASTRIC BIOPSY X1  . KIDNEY STONE SURGERY  2017    FAMILY HISTORY :   Family History  Problem Relation Age of Onset  . Diabetes Mother   . Diabetes Father   . CAD Father   . Dementia Father   . Diabetes Sister   . Cancer Maternal Uncle        Prostate  . Cancer Cousin        prostate    SOCIAL HISTORY:   Social History   Tobacco Use  . Smoking status: Never Smoker  . Smokeless tobacco: Never Used  Vaping Use  . Vaping Use: Never used  Substance Use Topics  . Alcohol use: No    Alcohol/week: 0.0 standard drinks  . Drug use: No    ALLERGIES:  has No Known Allergies.  MEDICATIONS:  Current Outpatient Medications  Medication Sig Dispense Refill  .  atorvastatin (LIPITOR) 40 MG tablet Take 1 tablet (40 mg total) by mouth 3 (three) times a week. 48 tablet 1  . blood glucose meter kit and supplies Dispense based on patient and insurance preference (Accuchek Aviva Plus, Accuchek Nano). Use up to four times daily as directed. (FOR ICD-10 E10.9, E11.9). 1 each 0  . entrectinib (ROZLYTREK) 200 MG capsule Take 3 capsules (600 mg total) by mouth daily. (Patient taking differently: Take 600 mg by mouth daily. Pt reports taking 2 pills daily per MD) 90 capsule 6  . finasteride (PROSCAR) 5 MG tablet Take 1 tablet (5 mg total) by mouth daily. 90 tablet 3  . gabapentin (NEURONTIN) 300 MG capsule Take 1 capsule by mouth at bedtime.    . metFORMIN (GLUCOPHAGE-XR) 750 MG 24 hr tablet Take 2 tablets (1,500 mg total) by mouth daily. When out of 500 mg pills 180 tablet 0  . rivaroxaban (XARELTO) 20 MG TABS tablet TAKE 1 TABLET BY MOUTH ONCE DAILY WITH SUPPER 30 tablet 6  . tamsulosin (FLOMAX) 0.4 MG CAPS capsule Take 1 capsule by mouth once daily in the morning 90 capsule 3  . meclizine (ANTIVERT) 25 MG tablet Take 1 tablet (25 mg total) by mouth 3 (three) times daily as needed for dizziness. (Patient not taking: No sig reported) 45 tablet 0  . nateglinide (STARLIX) 60 MG tablet Take  1 tablet (60 mg total) by mouth 3 (three) times daily with meals. (Patient not taking: No sig reported) 90 tablet 0  . ondansetron (ZOFRAN) 8 MG tablet One pill every 8 hours as needed for nausea/vomitting. (Patient not taking: No sig reported) 40 tablet 1   No current facility-administered medications for this visit.    PHYSICAL EXAMINATION: ECOG PERFORMANCE STATUS: 0 - Asymptomatic  BP (!) 151/90   Pulse 87   Temp (!) 96.8 F (36 C) (Tympanic)   Resp 18   Ht _0  (1.626 m)   Wt 169 lb (76.7 kg)   BMI 29.01 kg/m   Filed Weights   08/26/20 0933  Weight: 169 lb (76.7 kg)    Physical Exam Constitutional:      Comments: Patient is accompanied by his wife.  He is  walking by himself.  HENT:     Head: Normocephalic and atraumatic.     Mouth/Throat:     Pharynx: No oropharyngeal exudate.  Eyes:     Pupils: Pupils are equal, round, and reactive to light.  Cardiovascular:     Rate and Rhythm: Normal rate and regular rhythm.  Pulmonary:     Effort: No respiratory distress.     Breath sounds: No wheezing.     Comments: Decreased breath sounds bilaterally no wheeze or crackles. Abdominal:     General: Bowel sounds are normal. There is no distension.     Palpations: Abdomen is soft. There is no mass.     Tenderness: There is no abdominal tenderness. There is no guarding or rebound.  Musculoskeletal:        General: No tenderness. Normal range of motion.     Cervical back: Normal range of motion and neck supple.  Skin:    General: Skin is warm.  Neurological:     Mental Status: He is alert and oriented to person, place, and time.  Psychiatric:        Mood and Affect: Affect normal.    LABORATORY DATA:  I have reviewed the data as listed    Component Value Date/Time   NA 138 08/26/2020 0922   NA 139 01/01/2016 1000   K 4.3 08/26/2020 0922   CL 102 08/26/2020 0922   CO2 25 08/26/2020 0922   GLUCOSE 161 (H) 08/26/2020 0922   BUN 18 08/26/2020 0922   BUN 13 01/01/2016 1000   CREATININE 1.59 (H) 08/26/2020 0922   CREATININE 1.58 (H) 07/26/2020 1014   CALCIUM 9.6 08/26/2020 0922   PROT 7.4 08/26/2020 0922   PROT 7.2 01/01/2016 1000   ALBUMIN 4.2 08/26/2020 0922   ALBUMIN 4.4 01/01/2016 1000   AST 23 08/26/2020 0922   ALT 30 08/26/2020 0922   ALKPHOS 51 08/26/2020 0922   BILITOT 0.9 08/26/2020 0922   BILITOT 0.4 01/01/2016 1000   GFRNONAA 47 (L) 08/26/2020 0922   GFRNONAA 45 (L) 07/26/2020 1014   GFRAA 52 (L) 07/26/2020 1014    No results found for: SPEP, UPEP  Lab Results  Component Value Date   WBC 5.4 08/26/2020   NEUTROABS 2.1 08/26/2020   HGB 14.1 08/26/2020   HCT 41.1 08/26/2020   MCV 87.8 08/26/2020   PLT 208 08/26/2020       Chemistry      Component Value Date/Time   NA 138 08/26/2020 0922   NA 139 01/01/2016 1000   K 4.3 08/26/2020 0922   CL 102 08/26/2020 0922   CO2 25 08/26/2020 0922   BUN 18 08/26/2020  0981   BUN 13 01/01/2016 1000   CREATININE 1.59 (H) 08/26/2020 0922   CREATININE 1.58 (H) 07/26/2020 1014      Component Value Date/Time   CALCIUM 9.6 08/26/2020 0922   ALKPHOS 51 08/26/2020 0922   AST 23 08/26/2020 0922   ALT 30 08/26/2020 0922   BILITOT 0.9 08/26/2020 0922   BILITOT 0.4 01/01/2016 1000       RADIOGRAPHIC STUDIES: I have personally reviewed the radiological images as listed and agreed with the findings in the report. No results found.   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 2021-CT chest and pelvis shows no evidence of any progressive metastatic disease in the chest and pelvis.  Positive for left suprahilar posttreatment scarring; enlarged prostate-see below  # Continue 2 pills Rozyltek [CKD-StageIII]; continue 2 pills a day.   # Dizziness- subjective;s/p ENT evaluation; MRI brain Oct 2021-stable.  # Bilateral PE & left lower extremity DVT:  on Xarelto [refill]-stable.   # CKD stage III-creatinine 1.7 [GFR~49]-STABLE.   # Peripheral Neuropathy- ? DM;continue  gabapentin 200 mg qhs-STABLE.   # Prostatism BPH [s/p biopsy]- on flomax/finasteride stable.Marland Kitchen   #Discussed regarding the futility of screening colonoscopy in the context of his metastatic lung cancer to the brain.  Discussed patient median survival of ROS1 positive lung cancer is ~4 years.  He has been 4 years since diagnosis of his malignancy; and is definitely expected to live beyond the median expected survival.  Given the patient in general reluctance/overall futility of screening colonoscopy-I think is reasonable to hold off screening colonoscopy at this time.  Patient and wife in  agreement.  # DISPOSITION:   follow up in 6 weeks MD;labs- cbc/cmp;--Dr.B  Cc; Dr.Sowles.    No orders of the defined types were placed in this encounter.  All questions were answered. The patient knows to call the clinic with any problems, questions or concerns.      Cammie Sickle, MD 08/27/2020 12:02 AM

## 2020-08-26 NOTE — Assessment & Plan Note (Addendum)
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 2021-CT chest and pelvis shows no evidence of any progressive metastatic disease in the chest and pelvis.  Positive for left suprahilar posttreatment scarring; enlarged prostate-see below  # Continue 2 pills Rozyltek [CKD-StageIII]; continue 2 pills a day.   # Dizziness- subjective;s/p ENT evaluation; MRI brain Oct 2021-stable.  # Bilateral PE & left lower extremity DVT:  on Xarelto [refill]-stable.   # CKD stage III-creatinine 1.7 [GFR~49]-STABLE.   # Peripheral Neuropathy- ? DM;continue  gabapentin 200 mg qhs-STABLE.   # Prostatism BPH [s/p biopsy]- on flomax/finasteride stable.Marland Kitchen   #Discussed regarding the futility of screening colonoscopy in the context of his metastatic lung cancer to the brain.  Discussed patient median survival of ROS1 positive lung cancer is ~4 years.  He has been 4 years since diagnosis of his malignancy; and is definitely expected to live beyond the median expected survival.  Given the patient in general reluctance/overall futility of screening colonoscopy-I think is reasonable to hold off screening colonoscopy at this time.  Patient and wife in agreement.  # DISPOSITION:   follow up in 6 weeks MD;labs- cbc/cmp;--Dr.B  Cc; Dr.Sowles.

## 2020-09-03 ENCOUNTER — Telehealth: Payer: Self-pay | Admitting: Gastroenterology

## 2020-09-03 NOTE — Telephone Encounter (Signed)
Patient states he does not want to have procedure with Dr. Allen Norris on 1.4.22. Pt had screening call on 12.2.21. Please cancel appt.

## 2020-09-03 NOTE — Telephone Encounter (Signed)
Endo unit has been notified patient would like to cancel procedure.

## 2020-09-06 ENCOUNTER — Other Ambulatory Visit: Payer: Medicare Other

## 2020-09-10 ENCOUNTER — Encounter: Admission: RE | Payer: Self-pay | Source: Home / Self Care

## 2020-09-10 ENCOUNTER — Ambulatory Visit: Admission: RE | Admit: 2020-09-10 | Payer: Medicare Other | Source: Home / Self Care | Admitting: Gastroenterology

## 2020-09-10 SURGERY — COLONOSCOPY WITH PROPOFOL
Anesthesia: General

## 2020-09-15 ENCOUNTER — Other Ambulatory Visit: Payer: Self-pay | Admitting: Internal Medicine

## 2020-09-16 ENCOUNTER — Telehealth: Payer: Self-pay | Admitting: *Deleted

## 2020-09-16 NOTE — Telephone Encounter (Signed)
Patient called asking for assistance getting his Xarelto and Gabapentin stating that the co pay is more than he can afford. Please advise

## 2020-09-16 NOTE — Telephone Encounter (Signed)
Called and spoke to Mr Imhof.  Informed him that test claim for Xarelto shows he has a deductible of $480 to meet first before regular copay starts.  Unfortunately Xarelto is an expensive medication and the first fill of the year includes the full deductible. He is going to have his wife call me back to talk more because he was having difficulty.  He said he could afford his Gabapentin, he just needed a refill.  I see a pending refill request in his chart.  Bethena Roys

## 2020-09-17 ENCOUNTER — Telehealth: Payer: Self-pay | Admitting: Pharmacist

## 2020-09-17 MED ORDER — GABAPENTIN 300 MG PO CAPS: 300.0000 mg | ORAL_CAPSULE | Freq: Every evening | ORAL | 6 refills | Status: DC

## 2020-09-17 NOTE — Telephone Encounter (Signed)
RF submitted for Gabapentin

## 2020-09-17 NOTE — Telephone Encounter (Signed)
Oral Chemotherapy Pharmacist Encounter  Dispensed samples:  Medication: Xarelto 20mg  Instructions: TAKE 1 TABLET BY MOUTH ONCE DAILY WITH SUPPER Quantity dispensed: 21 Days supply: 21 Manufacturer: [employer] Lot: [id number] Exp: 05/07/2022  Darl Pikes, PharmD, BCPS, [facility] Hematology/Oncology Clinical Pharmacist [facility] [phone]  09/17/2020 9:33 AM

## 2020-09-19 NOTE — Telephone Encounter (Signed)
Oral Chemotherapy Pharmacist Encounter   Patient picked up medication on 09/17/20.  Darl Pikes, PharmD, BCPS, BCOP, CPP Hematology/Oncology Clinical Pharmacist [facility] [phone]  09/19/2020 9:33 AM

## 2020-10-07 ENCOUNTER — Inpatient Hospital Stay: Payer: Medicare Other | Attending: Internal Medicine | Admitting: Internal Medicine

## 2020-10-07 ENCOUNTER — Inpatient Hospital Stay: Payer: Medicare Other

## 2020-10-07 VITALS — BP 140/85 | HR 72 | Temp 97.1°F | Resp 16 | Ht 64.0 in | Wt 168.6 lb

## 2020-10-07 DIAGNOSIS — C7951 Secondary malignant neoplasm of bone: Secondary | ICD-10-CM | POA: Insufficient documentation

## 2020-10-07 DIAGNOSIS — Z7901 Long term (current) use of anticoagulants: Secondary | ICD-10-CM | POA: Insufficient documentation

## 2020-10-07 DIAGNOSIS — R200 Anesthesia of skin: Secondary | ICD-10-CM | POA: Diagnosis not present

## 2020-10-07 DIAGNOSIS — Z818 Family history of other mental and behavioral disorders: Secondary | ICD-10-CM | POA: Insufficient documentation

## 2020-10-07 DIAGNOSIS — R5383 Other fatigue: Secondary | ICD-10-CM | POA: Insufficient documentation

## 2020-10-07 DIAGNOSIS — G629 Polyneuropathy, unspecified: Secondary | ICD-10-CM | POA: Diagnosis not present

## 2020-10-07 DIAGNOSIS — N183 Chronic kidney disease, stage 3 unspecified: Secondary | ICD-10-CM | POA: Insufficient documentation

## 2020-10-07 DIAGNOSIS — N400 Enlarged prostate without lower urinary tract symptoms: Secondary | ICD-10-CM | POA: Insufficient documentation

## 2020-10-07 DIAGNOSIS — L600 Ingrowing nail: Secondary | ICD-10-CM

## 2020-10-07 DIAGNOSIS — Z8249 Family history of ischemic heart disease and other diseases of the circulatory system: Secondary | ICD-10-CM | POA: Insufficient documentation

## 2020-10-07 DIAGNOSIS — Z8673 Personal history of transient ischemic attack (TIA), and cerebral infarction without residual deficits: Secondary | ICD-10-CM | POA: Insufficient documentation

## 2020-10-07 DIAGNOSIS — Z8042 Family history of malignant neoplasm of prostate: Secondary | ICD-10-CM | POA: Diagnosis not present

## 2020-10-07 DIAGNOSIS — C3412 Malignant neoplasm of upper lobe, left bronchus or lung: Secondary | ICD-10-CM | POA: Insufficient documentation

## 2020-10-07 DIAGNOSIS — E1122 Type 2 diabetes mellitus with diabetic chronic kidney disease: Secondary | ICD-10-CM | POA: Insufficient documentation

## 2020-10-07 DIAGNOSIS — E785 Hyperlipidemia, unspecified: Secondary | ICD-10-CM | POA: Insufficient documentation

## 2020-10-07 DIAGNOSIS — C7931 Secondary malignant neoplasm of brain: Secondary | ICD-10-CM | POA: Diagnosis not present

## 2020-10-07 DIAGNOSIS — R42 Dizziness and giddiness: Secondary | ICD-10-CM | POA: Insufficient documentation

## 2020-10-07 DIAGNOSIS — Z833 Family history of diabetes mellitus: Secondary | ICD-10-CM | POA: Diagnosis not present

## 2020-10-07 DIAGNOSIS — R202 Paresthesia of skin: Secondary | ICD-10-CM | POA: Insufficient documentation

## 2020-10-07 DIAGNOSIS — Z79899 Other long term (current) drug therapy: Secondary | ICD-10-CM | POA: Insufficient documentation

## 2020-10-07 DIAGNOSIS — Z6828 Body mass index (BMI) 28.0-28.9, adult: Secondary | ICD-10-CM | POA: Insufficient documentation

## 2020-10-07 DIAGNOSIS — Z86711 Personal history of pulmonary embolism: Secondary | ICD-10-CM | POA: Diagnosis not present

## 2020-10-07 DIAGNOSIS — M2550 Pain in unspecified joint: Secondary | ICD-10-CM | POA: Insufficient documentation

## 2020-10-07 DIAGNOSIS — R972 Elevated prostate specific antigen [PSA]: Secondary | ICD-10-CM | POA: Diagnosis not present

## 2020-10-07 DIAGNOSIS — Z8719 Personal history of other diseases of the digestive system: Secondary | ICD-10-CM | POA: Diagnosis not present

## 2020-10-07 LAB — CBC WITH DIFFERENTIAL/PLATELET
Abs Immature Granulocytes: 0.03 10*3/uL (ref 0.00–0.07)
Basophils Absolute: 0 10*3/uL (ref 0.0–0.1)
Basophils Relative: 1 %
Eosinophils Absolute: 0.2 10*3/uL (ref 0.0–0.5)
Eosinophils Relative: 4 %
HCT: 41.9 % (ref 39.0–52.0)
Hemoglobin: 14.3 g/dL (ref 13.0–17.0)
Immature Granulocytes: 1 %
Lymphocytes Relative: 41 %
Lymphs Abs: 2.1 10*3/uL (ref 0.7–4.0)
MCH: 29.8 pg (ref 26.0–34.0)
MCHC: 34.1 g/dL (ref 30.0–36.0)
MCV: 87.3 fL (ref 80.0–100.0)
Monocytes Absolute: 0.5 10*3/uL (ref 0.1–1.0)
Monocytes Relative: 10 %
Neutro Abs: 2.3 10*3/uL (ref 1.7–7.7)
Neutrophils Relative %: 43 %
Platelets: 189 10*3/uL (ref 150–400)
RBC: 4.8 MIL/uL (ref 4.22–5.81)
RDW: 14.4 % (ref 11.5–15.5)
WBC: 5.1 10*3/uL (ref 4.0–10.5)
nRBC: 0 % (ref 0.0–0.2)

## 2020-10-07 LAB — COMPREHENSIVE METABOLIC PANEL
ALT: 30 U/L (ref 0–44)
AST: 22 U/L (ref 15–41)
Albumin: 4.2 g/dL (ref 3.5–5.0)
Alkaline Phosphatase: 52 U/L (ref 38–126)
Anion gap: 11 (ref 5–15)
BUN: 23 mg/dL (ref 8–23)
CO2: 23 mmol/L (ref 22–32)
Calcium: 9.2 mg/dL (ref 8.9–10.3)
Chloride: 100 mmol/L (ref 98–111)
Creatinine, Ser: 1.6 mg/dL — ABNORMAL HIGH (ref 0.61–1.24)
GFR, Estimated: 47 mL/min — ABNORMAL LOW (ref >60)
Glucose, Bld: 184 mg/dL — ABNORMAL HIGH (ref 70–99)
Potassium: 4.3 mmol/L (ref 3.5–5.1)
Sodium: 134 mmol/L — ABNORMAL LOW (ref 135–145)
Total Bilirubin: 0.6 mg/dL (ref 0.3–1.2)
Total Protein: 7.5 g/dL (ref 6.5–8.1)

## 2020-10-07 NOTE — Progress Notes (Signed)
[facility] OFFICE PROGRESS NOTE  Patient Care Team: [name], [name], MD as Medical Oncologist (Medical Oncology) Cathi Roan, [facility] (Pharmacist)  Cancer Staging No matching staging information was found for the patient.   Oncology History Overview Note  # OCT 2017- ADENO CA LUNG; STAGE IV [; LUL; bil supraclavicular LN; Left neck LN Bx]; ROS-1 MUTATED; s/p Carbo-alimta x1[oct 2017]  # NOV 1st 2017- XALKORI 250 mg BID; JAN 15th CT- PR;  # AUG 27th Chemo-RT to persistent LUL/mediastinal LN [s/p carbo-taxol- with RT; finished Sep 22nd 2018];   # July 14th 2020- multiple metastatic lesions of the brain [July 30 whole brain radiation finished April 21, 2019]; bone scan skull metastases /posterior left ninth rib metastases; April 04, 2019 stopped crizotinib;  #April 25, 2019-start Entrectinib 600 mg once a day; STopped in NOV 2020 [dizziness]; NOV 2020-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   # LLE DVT/bil PE/Multiple strokes [? On xarelto]-Lovenox; Jan mid 2018- xarelto [lovenox-insurance issues]  # MRI brain- multiple infarcts [2d echo/bubble study-NEG's/p Neurology eval]  ------------------------------------------------------------    # # s/p TURP [Sep 2017; Dr.Cope] DEC 26th CT- distended bladder;  # Elevated PSA-  FEB 2021- 12;  S/p Bx- Negative; follow with urology/Dr.Stoioff   # MOLECULAR TESTING- ROS-1 POSITIVE; ALK/EGFR-NEG; PDL-1 EXPRESSION- 90%** [HIGH]  # DEC 15th 2020- PALLIATIVE CARE -----------------------------------------------------------------    DIAGNOSIS: Adenocarcinoma the lung  ROS-1 +  STAGE:  IV    ;GOALS: Palliative  CURRENT/MOST RECENT  THERAPY- Avastin [C]   Primary cancer of left upper lobe of lung ([place])  08/09/2019 -  Chemotherapy   The patient had bevacizumab-awwb (MVASI) 800 mg in sodium chloride 0.9 % 100 mL chemo infusion, 725 mg, Intravenous,  Once, 6 of 6 cycles Dose modification: 10 mg/kg (original dose 10 mg/kg, Cycle 3, Reason: Other (see comments), Comment: weight change) Administration: 800 mg (08/09/2019), 800 mg (08/22/2019), 700 mg (09/19/2019), 700 mg (10/04/2019), 700 mg (10/18/2019), 700 mg (11/01/2019)  for chemotherapy treatment.        INTERVAL HISTORY:  Melvin Willis 70 y.o.  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  Patient continues to have intermittent episodes of dizziness.  However no falls.  No headaches.  He states to be compliant with his Rozyltrek.   No blood in stools black or stools.  No swelling in legs.  Chronic mild tingling and numbness in the extremities.  Review of Systems  Constitutional: Positive for malaise/fatigue. Negative for chills, diaphoresis, fever and weight loss.  HENT: Negative for nosebleeds and sore throat.   Eyes: Negative for double vision.  Respiratory: Negative for cough, hemoptysis, sputum production, shortness of breath and wheezing.   Cardiovascular: Negative for chest pain (Left upper chest wall pain chronic), palpitations and orthopnea.  Gastrointestinal: Negative for abdominal pain, blood in stool, constipation, diarrhea, heartburn and melena.  Genitourinary: Negative for dysuria.  Musculoskeletal: Positive for joint pain. Negative for back pain.  Skin: Negative.  Negative for itching and rash.  Neurological: Positive for tingling. Negative for focal weakness and weakness.  Endo/Heme/Allergies: Does not bruise/bleed easily.  Psychiatric/Behavioral: Positive for memory loss. Negative for depression. The patient is not  nervous/anxious and does not have  insomnia.     PAST MEDICAL HISTORY :  Past Medical History:  Diagnosis Date  . Abnormal prostate specific antigen 08/08/2012  . Adiposity 04/16/2015  . Chronic kidney disease (CKD), stage III (moderate) ([place]) 11/27/2016  . CVA (cerebral vascular accident) ([place]) 06/17/2016  . Diabetes mellitus without complication ([place])   . Diverticulosis of sigmoid colon 04/16/2015  . Dyslipidemia 03/18/2015  . Hemorrhoids, internal 04/16/2015  . Hypercholesteremia 04/16/2015  . Hyperlipidemia   . Hypertension   . Primary cancer of left upper lobe of lung ([place])   . Pulmonary embolism ([place])   . Wears dentures    partial upper    PAST SURGICAL HISTORY :   Past Surgical History:  Procedure Laterality Date  . COLONOSCOPY    . COLONOSCOPY WITH PROPOFOL N/A 05/06/2015   Procedure: COLONOSCOPY WITH PROPOFOL;  Surgeon: Lucilla Lame, MD;  Location: [facility];  Service: Endoscopy;  Laterality: N/A;  ASCENDING COLON POLYPS X 2 TERMINAL ILEUM BIOPSY RANDOM COLON BX. TRANSVERSE COLON POLYP SIGMOID COLON POLYP  . ESOPHAGOGASTRODUODENOSCOPY (EGD) WITH PROPOFOL N/A 05/06/2015   Procedure: ESOPHAGOGASTRODUODENOSCOPY (EGD) WITH PROPOFOL;  Surgeon: Lucilla Lame, MD;  Location: [facility];  Service: Endoscopy;  Laterality: N/A;  GASTRIC BIOPSY X1  . KIDNEY STONE SURGERY  2017    FAMILY HISTORY :   Family History  Problem Relation Age of Onset  . Diabetes Mother   . Diabetes Father   . CAD Father   . Dementia Father   . Diabetes Sister   . Cancer Maternal Uncle        Prostate  . Cancer Cousin        prostate    SOCIAL HISTORY:   Social History   Tobacco Use  . Smoking status: Never Smoker  . Smokeless tobacco: Never Used  Vaping Use  . Vaping Use: Never used  Substance Use Topics  . Alcohol use: No    Alcohol/week: 0.0 standard drinks  . Drug use: No    ALLERGIES:  has No Known Allergies.  MEDICATIONS:  Current Outpatient Medications  Medication Sig Dispense Refill  .  atorvastatin (LIPITOR) 40 MG tablet Take 1 tablet (40 mg total) by mouth 3 (three) times a week. 48 tablet 1  . blood glucose meter kit and supplies Dispense based on patient and insurance preference (Accuchek Aviva Plus, Accuchek Nano). Use up to four times daily as directed. (FOR ICD-10 E10.9, E11.9). 1 each 0  . entrectinib (ROZLYTREK) 200 MG capsule Take 3 capsules (600 mg total) by mouth daily. (Patient taking differently: Take 600 mg by mouth daily. Pt reports taking 2 pills daily per MD) 90 capsule 6  . finasteride (PROSCAR) 5 MG tablet Take 1 tablet (5 mg total) by mouth daily. 90 tablet 3  . gabapentin (NEURONTIN) 300 MG capsule Take 1 capsule (300 mg total) by mouth at bedtime. 30 capsule 6  . metFORMIN (GLUCOPHAGE-XR) 750 MG 24 hr tablet Take 2 tablets (1,500 mg total) by mouth daily. When out of 500 mg pills 180 tablet 0  . ondansetron (ZOFRAN) 8 MG tablet One pill every 8 hours as needed for nausea/vomitting. 40 tablet 1  . rivaroxaban (XARELTO) 20 MG TABS tablet TAKE 1 TABLET BY MOUTH ONCE DAILY WITH SUPPER 30 tablet 6  . tamsulosin (FLOMAX) 0.4 MG CAPS capsule Take 1 capsule by mouth once daily in the morning 90 capsule 3  . meclizine (ANTIVERT) 25 MG tablet Take 1 tablet (25 mg total)  by mouth 3 (three) times daily as needed for dizziness. (Patient not taking: No sig reported) 45 tablet 0  . nateglinide (STARLIX) 60 MG tablet Take 1 tablet (60 mg total) by mouth 3 (three) times daily with meals. (Patient not taking: No sig reported) 90 tablet 0   No current facility-administered medications for this visit.    PHYSICAL EXAMINATION: ECOG PERFORMANCE STATUS: 0 - Asymptomatic  BP 140/85 (BP Location: Left Arm, Patient Position: Sitting, Cuff Size: Normal)   Pulse 72   Temp (!) 97.1 F (36.2 C) (Tympanic)   Resp 16   Ht _0  (1.626 m)   Wt 168 lb 9.6 oz (76.5 kg)   SpO2 100%   BMI 28.94 kg/m   Filed Weights   10/07/20 1004  Weight: 168 lb 9.6 oz (76.5 kg)    Physical  Exam Constitutional:      Comments: Patient is accompanied by his wife.  He is walking by himself.  HENT:     Head: Normocephalic and atraumatic.     Mouth/Throat:     Pharynx: No oropharyngeal exudate.  Eyes:     Pupils: Pupils are equal, round, and reactive to light.  Cardiovascular:     Rate and Rhythm: Normal rate and regular rhythm.  Pulmonary:     Effort: No respiratory distress.     Breath sounds: No wheezing.     Comments: Decreased breath sounds bilaterally no wheeze or crackles. Abdominal:     General: Bowel sounds are normal. There is no distension.     Palpations: Abdomen is soft. There is no mass.     Tenderness: There is no abdominal tenderness. There is no guarding or rebound.  Musculoskeletal:        General: No tenderness. Normal range of motion.     Cervical back: Normal range of motion and neck supple.  Skin:    General: Skin is warm.  Neurological:     Mental Status: He is alert and oriented to person, place, and time.  Psychiatric:        Mood and Affect: Affect normal.    LABORATORY DATA:  I have reviewed the data as listed    Component Value Date/Time   NA 134 (L) 10/07/2020 0953   NA 139 01/01/2016 1000   K 4.3 10/07/2020 0953   CL 100 10/07/2020 0953   CO2 23 10/07/2020 0953   GLUCOSE 184 (H) 10/07/2020 0953   BUN 23 10/07/2020 0953   BUN 13 01/01/2016 1000   CREATININE 1.60 (H) 10/07/2020 0953   CREATININE 1.58 (H) 07/26/2020 1014   CALCIUM 9.2 10/07/2020 0953   PROT 7.5 10/07/2020 0953   PROT 7.2 01/01/2016 1000   ALBUMIN 4.2 10/07/2020 0953   ALBUMIN 4.4 01/01/2016 1000   AST 22 10/07/2020 0953   ALT 30 10/07/2020 0953   ALKPHOS 52 10/07/2020 0953   BILITOT 0.6 10/07/2020 0953   BILITOT 0.4 01/01/2016 1000   GFRNONAA 47 (L) 10/07/2020 0953   GFRNONAA 45 (L) 07/26/2020 1014   GFRAA 52 (L) 07/26/2020 1014    No results found for: SPEP, UPEP  Lab Results  Component Value Date   WBC 5.1 10/07/2020   NEUTROABS 2.3 10/07/2020    HGB 14.3 10/07/2020   HCT 41.9 10/07/2020   MCV 87.3 10/07/2020   PLT 189 10/07/2020      Chemistry      Component Value Date/Time   NA 134 (L) 10/07/2020 0953   NA 139 01/01/2016 1000  K 4.3 10/07/2020 0953   CL 100 10/07/2020 0953   CO2 23 10/07/2020 0953   BUN 23 10/07/2020 0953   BUN 13 01/01/2016 1000   CREATININE 1.60 (H) 10/07/2020 0953   CREATININE 1.58 (H) 07/26/2020 1014      Component Value Date/Time   CALCIUM 9.2 10/07/2020 0953   ALKPHOS 52 10/07/2020 0953   AST 22 10/07/2020 0953   ALT 30 10/07/2020 0953   BILITOT 0.6 10/07/2020 0953   BILITOT 0.4 01/01/2016 1000       RADIOGRAPHIC STUDIES: I have personally reviewed the radiological images as listed and agreed with the findings in the report. No results found.   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 2021-CT chest and pelvis shows no evidence of any progressive metastatic disease in the chest and pelvis.  Positive for left suprahilar posttreatment scarring; enlarged prostate- STABLE.   # Continue 2 pills Rozyltek [CKD-StageIII]; continue 2 pills a day.  Repeat CT chest-noncontrast 2 months  # Dizziness- subjective;s/p ENT evaluation; MRI brain Oct 28th 2021 STABLE.  Will recommend repeating MRI in 2 months.  # Bilateral PE & left lower extremity DVT:  on Xarelto [refill]- STABLE; discussed with wife; and also Bethena Roys regarding patient's financial concerns.   # CKD stage III-creatinine 1.7 [GFR~49]- STABLE. .   # Peripheral Neuropathy- ? DM;continue  gabapentin 200 mg qhs-STABLE.    # Prostatism BPH [s/p biopsy]- on flomax/finasteride-STABLE.   # DISPOSITION:  # referral to Dr.Fowler/podiatrist re: Ingrowing toe nails   follow up in 2 months- MD;labs- cbc/cmp; CT chest prior; MRI Brain;--Dr.B  Cc; Dr.Sowles.    Orders Placed This Encounter  Procedures  . CT Chest Wo Contrast     Standing Status:   Future    Standing Expiration Date:   10/07/2021    Order Specific Question:   Preferred imaging location?    Answer:   [facility]  . MR Brain W Wo Contrast    Standing Status:   Future    Standing Expiration Date:   10/07/2021    Order Specific Question:   If indicated for the ordered procedure, I authorize the administration of contrast media per Radiology protocol    Answer:   Yes    Order Specific Question:   What is the patient's sedation requirement?    Answer:   No Sedation    Order Specific Question:   Does the patient have a pacemaker or implanted devices?    Answer:   No    Order Specific Question:   Use SRS Protocol?    Answer:   No    Order Specific Question:   Preferred imaging location?    Answer:   [facility] (table limit - 550lbs)  . Ambulatory referral to Podiatry    Referral Priority:   Routine    Referral Type:   Consultation    Referral Reason:   Specialty Services Required    Referred to Provider:   Samara Deist, DPM    Requested Specialty:   Podiatry    Number of Visits Requested:   1   All questions were answered. The patient knows to call the clinic with any problems, questions or concerns.      Cammie Sickle, MD 10/07/2020 11:21 AM

## 2020-10-07 NOTE — Assessment & Plan Note (Addendum)
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 2021-CT chest and pelvis shows no evidence of any progressive metastatic disease in the chest and pelvis.  Positive for left suprahilar posttreatment scarring; enlarged prostate- STABLE.   # Continue 2 pills Rozyltek [CKD-StageIII]; continue 2 pills a day.  Repeat CT chest-noncontrast 2 months  # Dizziness- subjective;s/p ENT evaluation; MRI brain Oct 28th 2021 STABLE.  Will recommend repeating MRI in 2 months.  # Bilateral PE & left lower extremity DVT:  on Xarelto [refill]- STABLE; discussed with wife; and also Bethena Roys regarding patient's financial concerns.   # CKD stage III-creatinine 1.7 [GFR~49]- STABLE. .   # Peripheral Neuropathy- ? DM;continue  gabapentin 200 mg qhs-STABLE.    # Prostatism BPH [s/p biopsy]- on flomax/finasteride-STABLE.   # DISPOSITION:  # referral to Dr.Fowler/podiatrist re: Ingrowing toe nails   follow up in 2 months- MD;labs- cbc/cmp; CT chest prior; MRI Brain;--Dr.B  Cc; Dr.Sowles.

## 2020-10-09 ENCOUNTER — Other Ambulatory Visit: Payer: Self-pay | Admitting: Family Medicine

## 2020-10-09 DIAGNOSIS — E7800 Pure hypercholesterolemia, unspecified: Secondary | ICD-10-CM

## 2020-10-17 DIAGNOSIS — M79674 Pain in right toe(s): Secondary | ICD-10-CM | POA: Diagnosis not present

## 2020-10-17 DIAGNOSIS — B353 Tinea pedis: Secondary | ICD-10-CM | POA: Diagnosis not present

## 2020-10-17 DIAGNOSIS — M79675 Pain in left toe(s): Secondary | ICD-10-CM | POA: Diagnosis not present

## 2020-10-17 DIAGNOSIS — E119 Type 2 diabetes mellitus without complications: Secondary | ICD-10-CM | POA: Diagnosis not present

## 2020-10-17 DIAGNOSIS — B351 Tinea unguium: Secondary | ICD-10-CM | POA: Diagnosis not present

## 2020-10-21 ENCOUNTER — Other Ambulatory Visit: Payer: Self-pay | Admitting: Internal Medicine

## 2020-10-22 ENCOUNTER — Inpatient Hospital Stay: Payer: Medicare Other | Attending: Hospice and Palliative Medicine | Admitting: Hospice and Palliative Medicine

## 2020-10-22 ENCOUNTER — Other Ambulatory Visit: Payer: Self-pay

## 2020-10-22 DIAGNOSIS — Z515 Encounter for palliative care: Secondary | ICD-10-CM

## 2020-10-22 DIAGNOSIS — C3412 Malignant neoplasm of upper lobe, left bronchus or lung: Secondary | ICD-10-CM | POA: Diagnosis not present

## 2020-10-22 MED ORDER — METFORMIN HCL ER 750 MG PO TB24: 1500.0000 mg | ORAL_TABLET | Freq: Every day | ORAL | 0 refills | Status: DC

## 2020-10-22 NOTE — Progress Notes (Signed)
Virtual Visit via Telephone Note  I connected with Melvin Willis on 10/22/20 at 10:30 AM EST by telephone and verified that I am speaking with the correct person using two identifiers.   I discussed the limitations, risks, security and privacy concerns of performing an evaluation and management service by telephone and the availability of in person appointments. I also discussed with the patient that there may be a patient responsible charge related to this service. The patient expressed understanding and agreed to proceed.   History of Present Illness: Melvin Willis is a 70 y.o. 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  Patient is status post whole brain radiation and is currently on treatment with Avastin.  MRI of the brain on 07/25/2019 revealed progression of enhancing lesion in the left temporal lobe concerning for either radiation necrosis or progressive metastatic disease.  Patient was referred to palliative care to help address goals and manage ongoing symptoms.   Observations/Objective: Spoke with patient by phone.  Patient reports that he is doing well.  He denies any significant changes or concerns.  No symptomatic complaints at present.  No issues with medication or need for refills today.  Patient reports oral intake waxes and wanes but is overall stable.  Plan is for reimaging and clinic follow-up next month.  Assessment and Plan: Stage IV lung cancer -repeat CT/MRI next month.  Followed by Dr. Rogue Bussing.  Peripheral neuropathy -on gabapentin.    ACP -patient discussing decision making with his family  Follow Up Instructions: RTC next month   I discussed the assessment and treatment plan with the patient. The patient was provided an opportunity to ask questions and all were answered. The patient agreed with the plan and demonstrated an understanding of the instructions.   The patient was advised to  call back or seek an in-person evaluation if the symptoms worsen or if the condition fails to improve as anticipated.  I provided 5 minutes of non-face-to-face time during this encounter.   Irean Hong, NP

## 2020-10-30 NOTE — Progress Notes (Unsigned)
Name: Melvin Willis   MRN: [id number]    DOB: 30-Mar-1951   Date:10/31/2020       Progress Note  Subjective  Chief Complaint  Follow Up   HPI   DMII: glucose is out of control, A1C is over 10 %, he is not following a diabetic diet, not taking starlix, just went up on Metformin 7500 mg daily. We will try adding Soliqua, discussed possible side effects including nausea. He denies polyphagia, polydipsia but has polyuria - he has some BPH symptoms . He has been checking at home and has been 200's most of the time fasting  We will enroll him on community care services to help adjust dose of medication. Starting at 15 units   Chronic DVT left leg, history of PE : on long term Xarelto, rx given by Hematologist. He denies easy bruising. He was diagnosed with DVT and PE prior to cancer diagnosis back in 2017. He has some leg edema and wears compression stocking hoses daily to control symptoms. 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 Mickel Fuchs and Dr. Baruch Gouty, started on decadron and complete brain radiation in October and developed hearing loss but back to baseline now. He has some memory changes and intermittent dizziness since radiation   History of CVA: initially had dizziness and left side weakness, but no sequela now, still on statin and also on Xarelto. He has intermittent dizziness and fell last week. He also seems to have some memory issues lately, wife did most of the talking. She states he has been forgetful and is getting progressively worse   Atherosclerosis of aorta: he states taking Atorvastatin daily now , denies side effects   CKI stage III: last GFR 47  he denies pruritis, good urine output , continue to monitor   Patient Active Problem List   Diagnosis Date Noted  . Brain metastases ([place]) 09/25/2019  . Atherosclerosis of aorta ([place])  09/04/2019  . Goals of care, counseling/discussion 08/02/2019  . Primary malignant neoplasm of lung metastatic to other site ([place]) 04/07/2019  . Chronic deep vein thrombosis (DVT) of left lower extremity ([place]) 01/05/2018  . Chronic kidney disease (CKD), stage III (moderate) ([place]) 11/27/2016  . Old cerebrovascular accident (CVA) without late effect   . Blurred vision, bilateral 06/23/2016  . Primary cancer of left upper lobe of lung ([place]) 06/12/2016  . Mediastinal mass   . History of nephrolithiasis 03/27/2016  . Pharyngeal dysphagia 01/01/2016  . Benign neoplasm of ascending colon   . Benign neoplasm of sigmoid colon   . Benign neoplasm of transverse colon   . Diverticulosis of large intestine without diverticulitis   . Overweight (BMI 25.0-29.9) 04/16/2015  . Controlled diabetes mellitus with chronic kidney disease ([place]) 03/18/2015  . Dyslipidemia 03/18/2015  . Disorder of male genital organ 08/08/2012  . 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 Elevated PSA 08/08/2012    Past Surgical History:  Procedure Laterality Date  . COLONOSCOPY    . COLONOSCOPY WITH PROPOFOL N/A 05/06/2015   Procedure: COLONOSCOPY WITH PROPOFOL;  Surgeon: Lucilla Lame, MD;  Location: [facility];  Service: Endoscopy;  Laterality: N/A;  ASCENDING COLON POLYPS X 2 TERMINAL ILEUM BIOPSY RANDOM COLON BX. TRANSVERSE COLON POLYP SIGMOID COLON POLYP  . ESOPHAGOGASTRODUODENOSCOPY (EGD) WITH PROPOFOL N/A 05/06/2015   Procedure: ESOPHAGOGASTRODUODENOSCOPY (EGD) WITH PROPOFOL;  Surgeon: Lucilla Lame,  MD;  Location: [facility];  Service: Endoscopy;  Laterality: N/A;  GASTRIC BIOPSY X1  . KIDNEY STONE SURGERY  2017    Family History  Problem Relation Age of Onset  . Diabetes Mother   . Diabetes Father   . CAD Father   . Dementia Father   . Diabetes Sister   . Cancer Maternal Uncle        Prostate  . Cancer Cousin        prostate    Social History   Tobacco Use  . Smoking  status: Never Smoker  . Smokeless tobacco: Never Used  Substance Use Topics  . Alcohol use: No    Alcohol/week: 0.0 standard drinks     Current Outpatient Medications:  .  blood glucose meter kit and supplies, Dispense based on patient and insurance preference (Accuchek Aviva Plus, Accuchek Nano). Use up to four times daily as directed. (FOR ICD-10 E10.9, E11.9)., Disp: 1 each, Rfl: 0 .  entrectinib (ROZLYTREK) 200 MG capsule, Take 3 capsules (600 mg total) by mouth daily. (Patient taking differently: Take 600 mg by mouth daily. Pt reports taking 2 pills daily per MD), Disp: 90 capsule, Rfl: 6 .  finasteride (PROSCAR) 5 MG tablet, Take 1 tablet (5 mg total) by mouth daily., Disp: 90 tablet, Rfl: 3 .  gabapentin (NEURONTIN) 300 MG capsule, Take 1 capsule by mouth at bedtime, Disp: 30 capsule, Rfl: 0 .  Insulin Glargine-Lixisenatide (SOLIQUA) 100-33 UNT-MCG/ML SOPN, Inject 15-30 Units into the skin daily., Disp: 15 mL, Rfl: 1 .  Insulin Pen Needle (NOVOFINE PEN NEEDLE) 32G X 6 MM MISC, 1 each by Does not apply route daily., Disp: 100 each, Rfl: 2 .  meclizine (ANTIVERT) 25 MG tablet, Take 1 tablet (25 mg total) by mouth 3 (three) times daily as needed for dizziness., Disp: 45 tablet, Rfl: 0 .  metFORMIN (GLUCOPHAGE-XR) 750 MG 24 hr tablet, Take 2 tablets (1,500 mg total) by mouth daily., Disp: 180 tablet, Rfl: 0 .  ondansetron (ZOFRAN) 8 MG tablet, One pill every 8 hours as needed for nausea/vomitting., Disp: 40 tablet, Rfl: 1 .  rivaroxaban (XARELTO) 20 MG TABS tablet, TAKE 1 TABLET BY MOUTH ONCE DAILY WITH SUPPER, Disp: 30 tablet, Rfl: 6 .  tamsulosin (FLOMAX) 0.4 MG CAPS capsule, Take 1 capsule by mouth once daily in the morning, Disp: 90 capsule, Rfl: 3 .  atorvastatin (LIPITOR) 40 MG tablet, Take 1 tablet (40 mg total) by mouth daily., Disp: 90 tablet, Rfl: 0  No Known Allergies  I personally reviewed active problem list, medication list, allergies, family history, social history, health  maintenance with the patient/caregiver today.   ROS  Constitutional: Negative for fever or weight change.  Respiratory: Negative for cough and shortness of breath.   Cardiovascular: Negative for chest pain or palpitations.  Gastrointestinal: Negative for abdominal pain, no bowel changes.  Musculoskeletal: Negative for gait problem or joint swelling.  Skin: Negative for rash.  Neurological: Negative for dizziness or headache.  No other specific complaints in a complete review of systems (except as listed in HPI above).  Objective  Vitals:   10/31/20 0956  BP: 124/72  Pulse: 80  Resp: 16  Temp: 98 F (36.7 C)  TempSrc: Oral  SpO2: 99%  Weight: 167 lb (75.8 kg)  Height: '5\' 4"'  (1.626 m)    Body mass index is 28.67 kg/m.  Physical Exam  Constitutional: Patient appears well-developed and well-nourished. Overweight.  No distress.  HEENT: head atraumatic, normocephalic, pupils  equal and reactive to light, neck supple Cardiovascular: Normal rate, regular rhythm and normal heart sounds.  No murmur heard. No BLE edema. Pulmonary/Chest: Effort normal and breath sounds normal. No respiratory distress. Abdominal: Soft.  There is no tenderness. Psychiatric: Patient has a normal mood and affect. behavior is normal. Judgment and thought content normal.   Recent Results (from the past 2160 hour(s))  Comprehensive metabolic panel     Status: Abnormal   Collection Time: 08/26/20  9:22 AM  Result Value Ref Range   Sodium 138 135 - 145 mmol/L   Potassium 4.3 3.5 - 5.1 mmol/L   Chloride 102 98 - 111 mmol/L   CO2 25 22 - 32 mmol/L   Glucose, Bld 161 (H) 70 - 99 mg/dL    Comment: Glucose reference range applies only to samples taken after fasting for at least 8 hours.   BUN 18 8 - 23 mg/dL   Creatinine, Ser 1.59 (H) 0.61 - 1.24 mg/dL   Calcium 9.6 8.9 - 10.3 mg/dL   Total Protein 7.4 6.5 - 8.1 g/dL   Albumin 4.2 3.5 - 5.0 g/dL   AST 23 15 - 41 U/L   ALT 30 0 - 44 U/L   Alkaline  Phosphatase 51 38 - 126 U/L   Total Bilirubin 0.9 0.3 - 1.2 mg/dL   GFR, Estimated 47 (L) >60 mL/min    Comment: (NOTE) Calculated using the CKD-EPI Creatinine Equation (2021)    Anion gap 11 5 - 15    Comment: Performed at [facility], [address] [place], [place] [address]  CBC with Differential     Status: None   Collection Time: 08/26/20  9:22 AM  Result Value Ref Range   WBC 5.4 4.0 - 10.5 K/uL   RBC 4.68 4.22 - 5.81 MIL/uL   Hemoglobin 14.1 13.0 - 17.0 g/dL   HCT 41.1 39.0 - 52.0 %   MCV 87.8 80.0 - 100.0 fL   MCH 30.1 26.0 - 34.0 pg   MCHC 34.3 30.0 - 36.0 g/dL   RDW 14.6 11.5 - 15.5 %   Platelets 208 150 - 400 K/uL   nRBC 0.0 0.0 - 0.2 %   Neutrophils Relative % 38 %   Neutro Abs 2.1 1.7 - 7.7 K/uL   Lymphocytes Relative 45 %   Lymphs Abs 2.5 0.7 - 4.0 K/uL   Monocytes Relative 12 %   Monocytes Absolute 0.6 0.1 - 1.0 K/uL   Eosinophils Relative 4 %   Eosinophils Absolute 0.2 0.0 - 0.5 K/uL   Basophils Relative 1 %   Basophils Absolute 0.0 0.0 - 0.1 K/uL   Immature Granulocytes 0 %   Abs Immature Granulocytes 0.02 0.00 - 0.07 K/uL    Comment: Performed at [facility], [address] [place], [place] [address]  Comprehensive metabolic panel     Status: Abnormal   Collection Time: 10/07/20  9:53 AM  Result Value Ref Range   Sodium 134 (L) 135 - 145 mmol/L   Potassium 4.3 3.5 - 5.1 mmol/L   Chloride 100 98 - 111 mmol/L   CO2 23 22 - 32 mmol/L   Glucose, Bld 184 (H) 70 - 99 mg/dL    Comment: Glucose reference range applies only to samples taken after fasting for at least 8 hours.   BUN 23 8 - 23 mg/dL   Creatinine, Ser 1.60 (H) 0.61 - 1.24 mg/dL   Calcium 9.2 8.9 - 10.3 mg/dL   Total Protein 7.5 6.5 -  8.1 g/dL   Albumin 4.2 3.5 - 5.0 g/dL   AST 22 15 - 41 U/L   ALT 30 0 - 44 U/L   Alkaline Phosphatase 52 38 - 126 U/L   Total Bilirubin 0.6 0.3 - 1.2 mg/dL   GFR, Estimated 47 (L) >60 mL/min    Comment: (NOTE) Calculated using the CKD-EPI  Creatinine Equation (2021)    Anion gap 11 5 - 15    Comment: Performed at [facility], [address] [place], [place] [address]  CBC with Differential     Status: None   Collection Time: 10/07/20  9:53 AM  Result Value Ref Range   WBC 5.1 4.0 - 10.5 K/uL   RBC 4.80 4.22 - 5.81 MIL/uL   Hemoglobin 14.3 13.0 - 17.0 g/dL   HCT 41.9 39.0 - 52.0 %   MCV 87.3 80.0 - 100.0 fL   MCH 29.8 26.0 - 34.0 pg   MCHC 34.1 30.0 - 36.0 g/dL   RDW 14.4 11.5 - 15.5 %   Platelets 189 150 - 400 K/uL   nRBC 0.0 0.0 - 0.2 %   Neutrophils Relative % 43 %   Neutro Abs 2.3 1.7 - 7.7 K/uL   Lymphocytes Relative 41 %   Lymphs Abs 2.1 0.7 - 4.0 K/uL   Monocytes Relative 10 %   Monocytes Absolute 0.5 0.1 - 1.0 K/uL   Eosinophils Relative 4 %   Eosinophils Absolute 0.2 0.0 - 0.5 K/uL   Basophils Relative 1 %   Basophils Absolute 0.0 0.0 - 0.1 K/uL   Immature Granulocytes 1 %   Abs Immature Granulocytes 0.03 0.00 - 0.07 K/uL    Comment: Performed at [facility], [place], [place] [address]  POCT HgB A1C     Status: Abnormal   Collection Time: 10/31/20 10:06 AM  Result Value Ref Range   Hemoglobin A1C 10.5 (A) 4.0 - 5.6 %   HbA1c POC (<> result, manual entry)     HbA1c, POC (prediabetic range)     HbA1c, POC (controlled diabetic range)       PHQ2/9: Depression screen [facility] 2/9 10/31/2020 07/26/2020 04/24/2020 04/18/2020 09/06/2019  Decreased Interest 0 0 0 0 0  Down, Depressed, Hopeless 0 0 0 0 0  PHQ - 2 Score 0 0 0 0 0  Altered sleeping - - 0 - 0  Tired, decreased energy - - 0 - 0  Change in appetite - - 0 - 0  Feeling bad or failure about yourself  - - 0 - 0  Trouble concentrating - - 0 - 0  Moving slowly or fidgety/restless - - 0 - 0  Suicidal thoughts - - 0 - 0  PHQ-9 Score - - 0 - 0  Difficult doing work/chores - - Not difficult at all - Not difficult at all  Some recent data might be hidden    phq 9 is negative   Fall Risk: Fall Risk  10/31/2020 07/26/2020  04/24/2020 04/18/2020 10/18/2019  Falls in the past year? 0 0 0 0 0  Number falls in past yr: 0 0 0 0 -  Injury with Fall? 0 0 0 0 -  Risk for fall due to : - - - No Fall Risks -  Follow up - - - Falls prevention discussed -    Functional Status Survey: Is the patient deaf or have difficulty hearing?: No Does the patient have difficulty seeing, even when wearing glasses/contacts?: No Does the patient  have difficulty concentrating, remembering, or making decisions?: Yes Does the patient have difficulty walking or climbing stairs?: Yes Does the patient have difficulty dressing or bathing?: No Does the patient have difficulty doing errands alone such as visiting a doctor's office or shopping?: Yes    Assessment & Plan  1. Uncontrolled type 2 diabetes mellitus with hyperglycemia (HCC)  - POCT HgB A1C - Insulin Glargine-Lixisenatide (SOLIQUA) 100-33 UNT-MCG/ML SOPN; Inject 15-30 Units into the skin daily.  Dispense: 15 mL; Refill: 1 - Insulin Pen Needle (NOVOFINE PEN NEEDLE) 32G X 6 MM MISC; 1 each by Does not apply route daily.  Dispense: 100 each; Refill: 2 - AMB Referral to [facility]  2. Colon cancer screening  On palliative care not indicated   3. Hypercholesteremia  - atorvastatin (LIPITOR) 40 MG tablet; Take 1 tablet (40 mg total) by mouth daily.  Dispense: 90 tablet; Refill: 0  4. Stage 3a chronic kidney disease ([place])   5. DM type 2 with diabetic dyslipidemia ([place])  On statin therapy   6. Old cerebrovascular accident (CVA) without late effect   7. Atherosclerosis of aorta ([place])  On statin therapy   8. Chronic deep vein thrombosis (DVT) of left lower extremity, unspecified vein (HCC)   9. Primary malignant neoplasm of lung metastatic to other site, unspecified laterality ([place])   10. Palliative care patient  Not a candidate for colon cancer screen

## 2020-10-31 ENCOUNTER — Encounter: Payer: Self-pay | Admitting: Family Medicine

## 2020-10-31 ENCOUNTER — Other Ambulatory Visit: Payer: Self-pay

## 2020-10-31 ENCOUNTER — Ambulatory Visit (INDEPENDENT_AMBULATORY_CARE_PROVIDER_SITE_OTHER): Payer: Medicare Other | Admitting: Family Medicine

## 2020-10-31 VITALS — BP 124/72 | HR 80 | Temp 98.0°F | Resp 16 | Ht 64.0 in | Wt 167.0 lb

## 2020-10-31 DIAGNOSIS — E1165 Type 2 diabetes mellitus with hyperglycemia: Secondary | ICD-10-CM | POA: Diagnosis not present

## 2020-10-31 DIAGNOSIS — E785 Hyperlipidemia, unspecified: Secondary | ICD-10-CM

## 2020-10-31 DIAGNOSIS — C3490 Malignant neoplasm of unspecified part of unspecified bronchus or lung: Secondary | ICD-10-CM | POA: Diagnosis not present

## 2020-10-31 DIAGNOSIS — N1831 Chronic kidney disease, stage 3a: Secondary | ICD-10-CM | POA: Diagnosis not present

## 2020-10-31 DIAGNOSIS — E1169 Type 2 diabetes mellitus with other specified complication: Secondary | ICD-10-CM | POA: Diagnosis not present

## 2020-10-31 DIAGNOSIS — Z515 Encounter for palliative care: Secondary | ICD-10-CM

## 2020-10-31 DIAGNOSIS — E7800 Pure hypercholesterolemia, unspecified: Secondary | ICD-10-CM | POA: Diagnosis not present

## 2020-10-31 DIAGNOSIS — Z8673 Personal history of transient ischemic attack (TIA), and cerebral infarction without residual deficits: Secondary | ICD-10-CM

## 2020-10-31 DIAGNOSIS — I700 Atherosclerosis of aorta: Secondary | ICD-10-CM

## 2020-10-31 DIAGNOSIS — Z1211 Encounter for screening for malignant neoplasm of colon: Secondary | ICD-10-CM | POA: Diagnosis not present

## 2020-10-31 DIAGNOSIS — I82502 Chronic embolism and thrombosis of unspecified deep veins of left lower extremity: Secondary | ICD-10-CM

## 2020-10-31 DIAGNOSIS — E1122 Type 2 diabetes mellitus with diabetic chronic kidney disease: Secondary | ICD-10-CM

## 2020-10-31 LAB — POCT GLYCOSYLATED HEMOGLOBIN (HGB A1C): Hemoglobin A1C: 10.5 % — AB (ref 4.0–5.6)

## 2020-10-31 MED ORDER — NOVOFINE PEN NEEDLE 32G X 6 MM MISC: 1.0000 | Freq: Every day | 2 refills | Status: AC

## 2020-10-31 MED ORDER — ATORVASTATIN CALCIUM 40 MG PO TABS: 40.0000 mg | ORAL_TABLET | Freq: Every day | ORAL | 0 refills | Status: DC

## 2020-10-31 MED ORDER — SOLIQUA 100-33 UNT-MCG/ML ~~LOC~~ SOPN: 15.0000 [IU] | PEN_INJECTOR | Freq: Every day | SUBCUTANEOUS | 1 refills | Status: DC

## 2020-11-01 ENCOUNTER — Telehealth: Payer: Self-pay | Admitting: *Deleted

## 2020-11-01 NOTE — Chronic Care Management (AMB) (Signed)
  Chronic Care Management   Note  11/01/2020 Name: Melvin Willis MRN: [id number] DOB: 1951/01/20  Melvin Willis is a 70 y.o. year old male who is a primary care patient of [name], MD. I reached out to [name] by phone today in response to a referral sent by [name] PCP, Dr. Ancil Boozer     [name] was given information about Chronic Care Management services today including:  1. CCM service includes personalized support from designated clinical staff supervised by his physician, including individualized plan of care and coordination with other care providers 2. 24/7 contact phone numbers for assistance for urgent and routine care needs. 3. Service will only be billed when office clinical staff spend 20 minutes or more in a month to coordinate care. 4. Only one practitioner may furnish and bill the service in a calendar month. 5. The patient may stop CCM services at any time (effective at the end of the month) by phone call to the office staff. 6. The patient will be responsible for cost sharing (co-pay) of up to 20% of the service fee (after annual deductible is met).  Patient agreed to services and verbal consent obtained.   Follow up plan: Telephone appointment with care management team member scheduled for:11/06/2020  Stacey Snead  Care Guide, Embedded Care Coordination Cutter  Care Management

## 2020-11-05 ENCOUNTER — Telehealth: Payer: Self-pay | Admitting: *Deleted

## 2020-11-05 ENCOUNTER — Other Ambulatory Visit: Payer: Self-pay | Admitting: *Deleted

## 2020-11-05 ENCOUNTER — Telehealth: Payer: Self-pay

## 2020-11-05 DIAGNOSIS — C3412 Malignant neoplasm of upper lobe, left bronchus or lung: Secondary | ICD-10-CM

## 2020-11-05 DIAGNOSIS — I2699 Other pulmonary embolism without acute cor pulmonale: Secondary | ICD-10-CM

## 2020-11-05 MED ORDER — RIVAROXABAN 20 MG PO TABS: ORAL_TABLET | ORAL | 6 refills | Status: DC

## 2020-11-05 NOTE — Progress Notes (Signed)
    Chronic Care Management Pharmacy Assistant   Name: Melvin Willis  MRN: [id number] DOB: July 21, 1951  Reason for Encounter: Medication Review/Initial question for initial visit with clinical pharmacist on 11/06/2020.   PCP : [name], MD  Allergies:  No Known Allergies  Medications: Outpatient Encounter Medications as of 11/05/2020  Medication Sig  . atorvastatin (LIPITOR) 40 MG tablet Take 1 tablet (40 mg total) by mouth daily.  . blood glucose meter kit and supplies Dispense based on patient and insurance preference (Accuchek Aviva Plus, Accuchek Nano). Use up to four times daily as directed. (FOR ICD-10 E10.9, E11.9).  Marland Kitchen entrectinib (ROZLYTREK) 200 MG capsule Take 3 capsules (600 mg total) by mouth daily. (Patient taking differently: Take 600 mg by mouth daily. Pt reports taking 2 pills daily per MD)  . finasteride (PROSCAR) 5 MG tablet Take 1 tablet (5 mg total) by mouth daily.  Marland Kitchen gabapentin (NEURONTIN) 300 MG capsule Take 1 capsule by mouth at bedtime  . Insulin Glargine-Lixisenatide (SOLIQUA) 100-33 UNT-MCG/ML SOPN Inject 15-30 Units into the skin daily.  . Insulin Pen Needle (NOVOFINE PEN NEEDLE) 32G X 6 MM MISC 1 each by Does not apply route daily.  . meclizine (ANTIVERT) 25 MG tablet Take 1 tablet (25 mg total) by mouth 3 (three) times daily as needed for dizziness.  . metFORMIN (GLUCOPHAGE-XR) 750 MG 24 hr tablet Take 2 tablets (1,500 mg total) by mouth daily.  . ondansetron (ZOFRAN) 8 MG tablet One pill every 8 hours as needed for nausea/vomitting.  . rivaroxaban (XARELTO) 20 MG TABS tablet TAKE 1 TABLET BY MOUTH ONCE DAILY WITH SUPPER  . tamsulosin (FLOMAX) 0.4 MG CAPS capsule Take 1 capsule by mouth once daily in the morning   No facility-administered encounter medications on file as of 11/05/2020.    Current Diagnosis: Patient Active Problem List   Diagnosis Date Noted  . Brain metastases ([place]) 09/25/2019  . Atherosclerosis of aorta ([place]) 09/04/2019  .  Goals of care, counseling/discussion 08/02/2019  . Primary malignant neoplasm of lung metastatic to other site ([place]) 04/07/2019  . Chronic deep vein thrombosis (DVT) of left lower extremity ([place]) 01/05/2018  . Chronic kidney disease (CKD), stage III (moderate) ([place]) 11/27/2016  . Old cerebrovascular accident (CVA) without late effect   . Blurred vision, bilateral 06/23/2016  . Primary cancer of left upper lobe of lung ([place]) 06/12/2016  . Mediastinal mass   . History of nephrolithiasis 03/27/2016  . Pharyngeal dysphagia 01/01/2016  . Benign neoplasm of ascending colon   . Benign neoplasm of sigmoid colon   . Benign neoplasm of transverse colon   . Diverticulosis of large intestine without diverticulitis   . Overweight (BMI 25.0-29.9) 04/16/2015  . Controlled diabetes mellitus with chronic kidney disease ([place]) 03/18/2015  . Dyslipidemia 03/18/2015  . Disorder of male genital organ 08/08/2012  . 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 Elevated PSA 08/08/2012    Goals Addressed   None     Unable to leave  Voice message to do initial question prior to patient appointment on 11/06/2020 for CCM at 11:00 am  with [name] the Clinical pharmacist.   Please bring medications and supplements to appointment   Follow-Up:  Pharmacist Review   Bessie [place] Pharmacist Assistant [phone]

## 2020-11-05 NOTE — Progress Notes (Signed)
Chronic Care Management Pharmacy Note  11/07/2020 Name:  OSMAR HOWTON MRN:  [id number] DOB:  11/25/1950  Subjective: Melvin Willis is an 70 y.o. year old male who is a primary patient of [name], MD.  The CCM team was consulted for assistance with disease management and care coordination needs.    Engaged with patient by telephone for initial visit in response to provider referral for pharmacy case management and/or care coordination services.   Consent to Services:  The patient was given the following information about Chronic Care Management services today, agreed to services, and gave verbal consent: 1. CCM service includes personalized support from designated clinical staff supervised by the primary care provider, including individualized plan of care and coordination with other care providers 2. 24/7 contact phone numbers for assistance for urgent and routine care needs. 3. Service will only be billed when office clinical staff spend 20 minutes or more in a month to coordinate care. 4. Only one practitioner may furnish and bill the service in a calendar month. 5.The patient may stop CCM services at any time (effective at the end of the month) by phone call to the office staff. 6. The patient will be responsible for cost sharing (co-pay) of up to 20% of the service fee (after annual deductible is met). Patient agreed to services and consent obtained.  Patient Care Team: [name], [name], [name], [name], [facility] (Pharmacist)  Recent office visits: 10/31/2020: Patient presented to Dr. Ancil Boozer for follw-up. A1c worsened to 10.5%. Starlix stopped, patient started on Soliqua. Atorvastatin increased to 40 mg daily   Recent consult visits: 10/07/2020: Patient presented to Dr. Bradd Canary (Oncology) for follow-up.    Hospital visits: None in previous 6 months  Objective:  Lab Results  Component Value Date   CREATININE 1.60 (H) 10/07/2020   BUN 23 10/07/2020   GFRNONAA 47 (L) 10/07/2020   GFRAA 52 (L) 07/26/2020   NA 134 (L) 10/07/2020   K 4.3 10/07/2020   CALCIUM 9.2 10/07/2020   CO2 23 10/07/2020    Lab Results  Component Value Date/Time   HGBA1C 10.5 (A) 10/31/2020 10:06 AM   HGBA1C 9.4 (H) 07/26/2020 10:14 AM   HGBA1C 7.1 (H) 03/29/2020 09:34 AM   HGBA1C 9.8 (A) 04/07/2019 11:30 AM   MICROALBUR 50 12/29/2018 09:13 AM   MICROALBUR 20 01/05/2018 10:22 AM    Last diabetic Eye exam:  Lab Results  Component Value Date/Time   HMDIABEYEEXA No Retinopathy 05/22/2020 12:00 AM    Last diabetic Foot exam: No results found for: HMDIABFOOTEX   Lab Results  Component Value Date   CHOL 150 07/26/2020   HDL 45 07/26/2020   LDLCALC 81 07/26/2020   TRIG 144 07/26/2020   CHOLHDL 3.3 07/26/2020    Hepatic Function Latest Ref Rng & Units 10/07/2020 08/26/2020 07/29/2020  Total Protein 6.5 - 8.1 g/dL 7.5 7.4 7.4  Albumin 3.5 - 5.0 g/dL 4.2 4.2 4.0  AST 15 - 41 U/L _0 ALT 0 - 44 U/L _1 Alk Phosphatase 38 - 126 U/L 52 51 49  Total Bilirubin 0.3 - 1.2 mg/dL 0.6 0.9 0.9    Lab Results  Component Value Date/Time   TSH 0.854 01/01/2016 10:00 AM    CBC Latest Ref Rng & Units 10/07/2020 08/26/2020 07/29/2020  WBC 4.0 - 10.5 K/uL 5.1 5.4  4.8  Hemoglobin 13.0 - 17.0 g/dL 14.3 14.1 13.9  Hematocrit 39.0 - 52.0 % 41.9 41.1 41.1  Platelets 150 - 400 K/uL 189 208 198    Lab Results  Component Value Date/Time   VD25OH 17 (L) 08/22/2018 11:07 AM   VD25OH 25.1 (L) 01/01/2016 10:00 AM    Clinical ASCVD: Yes  The 10-year ASCVD risk score Mikey Bussing DC Jr., et al., 2013) is: 17.4%   Values used to calculate the score:     Age: 89 years     Sex: Male     Is Non-Hispanic African American: Yes     Diabetic: Yes     Tobacco smoker: No     Systolic Blood Pressure: 793 mmHg     Is BP  treated: No     HDL Cholesterol: 45 mg/dL     Total Cholesterol: 150 mg/dL    Depression screen [facility] 2/9 10/31/2020 07/26/2020 04/24/2020  Decreased Interest 0 0 0  Down, Depressed, Hopeless 0 0 0  PHQ - 2 Score 0 0 0  Altered sleeping - - 0  Tired, decreased energy - - 0  Change in appetite - - 0  Feeling bad or failure about yourself  - - 0  Trouble concentrating - - 0  Moving slowly or fidgety/restless - - 0  Suicidal thoughts - - 0  PHQ-9 Score - - 0  Difficult doing work/chores - - Not difficult at all  Some recent data might be hidden    Social History   Tobacco Use  Smoking Status Never Smoker  Smokeless Tobacco Never Used   BP Readings from Last 3 Encounters:  10/31/20 124/72  10/07/20 140/85  08/26/20 (!) 151/90   Pulse Readings from Last 3 Encounters:  10/31/20 80  10/07/20 72  08/26/20 87   Wt Readings from Last 3 Encounters:  10/31/20 167 lb (75.8 kg)  10/07/20 168 lb 9.6 oz (76.5 kg)  08/26/20 169 lb (76.7 kg)    Assessment/Interventions: Review of patient past medical history, allergies, medications, health status, including review of consultants reports, laboratory and other test data, was performed as part of comprehensive evaluation and provision of chronic care management services.   SDOH:  (Social Determinants of Health) assessments and interventions performed: Yes SDOH Interventions   Flowsheet Row Most Recent Value  SDOH Interventions   Financial Strain Interventions Other (Comment)  [PAP]      CCM Care Plan  No Known Allergies  Medications Reviewed Today    Reviewed by [name], MD (Physician) on 10/31/20 at 1021  Med List Status: <None>  Medication Order Taking? Sig Documenting Provider Last Dose Status Informant  atorvastatin (LIPITOR) 40 MG tablet [id number]  Take 1 tablet (40 mg total) by mouth daily. [name], MD  Active   blood glucose meter kit and supplies [id number] Yes Dispense based on patient and insurance  preference (Accuchek Aviva Plus, Accuchek Nano). Use up to four times daily as directed. (FOR ICD-10 E10.9, E11.9). [name], MD Taking Active   entrectinib (ROZLYTREK) 200 MG capsule [id number] Yes Take 3 capsules (600 mg total) by mouth daily.  Patient taking differently: Take 600 mg by mouth daily. Pt reports taking 2 pills daily per MD   Cammie Sickle, MD Taking Active            Med Note Barbarann Ehlers Jun 13, 2020  8:56 AM)    finasteride (PROSCAR) 5 MG tablet [id number] Yes Take 1 tablet (5 mg  total) by mouth daily. Billey Co, MD Taking Active   gabapentin (NEURONTIN) 300 MG capsule [id number] Yes Take 1 capsule by mouth at bedtime Cammie Sickle, MD Taking Active   Insulin Glargine-Lixisenatide [facility]) 100-33 UNT-MCG/ML SOPN [id number] Yes Inject 15-30 Units into the skin daily. [name], MD  Active   Insulin Pen Needle (NOVOFINE PEN NEEDLE) 32G X 6 MM MISC [id number] Yes 1 each by Does not apply route daily. [name], MD  Active   meclizine (ANTIVERT) 25 MG tablet [id number] Yes Take 1 tablet (25 mg total) by mouth 3 (three) times daily as needed for dizziness. Cammie Sickle, MD Taking Active   metFORMIN (GLUCOPHAGE-XR) 750 MG 24 hr tablet [id number] Yes Take 2 tablets (1,500 mg total) by mouth daily. [name], MD Taking Active   ondansetron [facility]) 8 MG tablet [id number] Yes One pill every 8 hours as needed for nausea/vomitting. Cammie Sickle, MD Taking Active            Med Note Barbarann Ehlers Jun 13, 2020  8:56 AM)    rivaroxaban (XARELTO) 20 MG TABS tablet [id number] Yes TAKE 1 TABLET BY MOUTH ONCE DAILY WITH SUPPER Cammie Sickle, MD Taking Active   tamsulosin [facility]) 0.4 MG CAPS capsule [id number] Yes Take 1 capsule by mouth once daily in the morning Abbie Sons, MD Taking Active   Med List Note Darl Pikes, RPH-CPP 03/30/19 1612): Rozlytrek filled at [place]           Patient Active Problem List   Diagnosis Date Noted  . Brain metastases ([place]) 09/25/2019  . Atherosclerosis of aorta ([place]) 09/04/2019  . Goals of care, counseling/discussion 08/02/2019  . Primary malignant neoplasm of lung metastatic to other site ([place]) 04/07/2019  . Chronic deep vein thrombosis (DVT) of left lower extremity ([place]) 01/05/2018  . Chronic kidney disease (CKD), stage III (moderate) ([place]) 11/27/2016  . Old cerebrovascular accident (CVA) without late effect   . Blurred vision, bilateral 06/23/2016  . Primary cancer of left upper lobe of lung ([place]) 06/12/2016  . Mediastinal mass   . History of nephrolithiasis 03/27/2016  . Pharyngeal dysphagia 01/01/2016  . Benign neoplasm of ascending colon   . Benign neoplasm of sigmoid colon   . Benign neoplasm of transverse colon   . Diverticulosis of large intestine without diverticulitis   . Overweight (BMI 25.0-29.9) 04/16/2015  . 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 Dyslipidemia 03/18/2015  . Disorder of male genital organ 08/08/2012  . 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 Elevated PSA 08/08/2012    Immunization History  Administered Date(s) Administered  . Influenza, High Dose Seasonal PF 07/10/2020  . Influenza,inj,Quad PF,6+ Mos 06/04/2016  . Influenza-Unspecified 06/04/2016, 06/17/2019  . PFIZER(Purple Top)SARS-COV-2 Vaccination 10/28/2019, 11/19/2019, 07/10/2020  . Pneumococcal Conjugate-13 08/22/2018  . Pneumococcal Polysaccharide-23 01/18/2012, 07/26/2020  . Tdap 01/18/2012  . Zoster 04/03/2016    Conditions to be addressed/monitored:  Hyperlipidemia, Diabetes, Coronary Artery Disease, Chronic Kidney Disease, BPH and Chronic DVT, and Metastatic lung cancer  Care Plan : General Pharmacy (Adult)  Updates made by Germaine Pomfret, RPH since 11/07/2020 12:00 AM    Problem: Hyperlipidemia, Diabetes, Coronary Artery Disease, Chronic Kidney Disease, BPH and  Chronic DVT, and Metastatic lung cancer   Priority: High    Long-Range Goal: Patient-Specific Goal   Start Date: 11/06/2020  Expected End Date: 05/10/2021  This Visit's Progress: On track  Priority: High  Note:   Current Barriers:  . Unable to independently afford treatment regimen . Unable to achieve control of diabetes   Pharmacist Clinical Goal(s):  Marland Kitchen Over the next 90 days, patient will verbalize ability to afford treatment regimen . achieve control of diabetes as evidenced by A1c less than 8% through collaboration with PharmD and provider.   Interventions: . 1:1 collaboration with [name], MD regarding development and update of comprehensive plan of care as evidenced by provider attestation and co-signature . Inter-disciplinary care team collaboration (see longitudinal plan of care) . Comprehensive medication review performed; medication list updated in electronic medical record  Hyperlipidemia: (LDL goal < 100) -Controlled -Current treatment: . Atorvastatin 40 mg daily  -Medications previously tried: NA  -Educated on Importance of limiting foods high in cholesterol; -Recommended to continue current medication  Diabetes (A1c goal <8%) -Controlled -Current medications: . Soliqua 15-30 units daily (15 units)  . Metformin XR 750 mg 2 tablets daily  -Medications previously tried: NA  -Current home glucose readings   Fasting   2-Mar 102   1-Mar 150   28-Feb 194   27-Feb 229   26-Feb 213   25-Feb 190   24-Feb 160 Started Soliqua  Average 177    -Reports hypoglycemic/hyperglycemic symptoms -Midday was 88. Nervous, shaky, weakness,  -Educated onA1c and blood sugar goals; Prevention and management of hypoglycemic episodes; Carbohydrate counting and/or plate method -Counseled to check feet daily and get yearly eye exams -Recommended to continue current medication  Urinary Obstruction (Goal: maintain urinary flow) -Controlled -Current treatment  . Finasteride 5  mg daily  . Tamsulosin 0.4 mg daily  -Medications previously tried: NA  -Recommended to continue current medication  Metastatic lung cancer (Goal: Prevent worsening and complications of cancer) -Controlled -Current treatment  . Entrectinib 200 mg 2 capsules daily?  -Current anticoagulation treatment  . Xarelto 20 mg daily  -Medications previously tried: NA  -Recommended to continue current medication  Patient Goals/Self-Care Activities . Over the next 90 days, patient will:  - check glucose daily before breakfast, document, and provide at future appointments  Follow Up Plan: Telephone follow up appointment with care management team member scheduled for: 01/29/2021 at 11:00 AM      Medication Assistance: Application for Xarelto, [place]  medication assistance program. in process.  Anticipated assistance start date 12/08/2020.  See plan of care for additional detail.  Patient's preferred pharmacy is:  [facility] [address], [place] - [address] [place] [address] Phone: [phone] Fax: [place] [address]), [place] [address] Phone: [phone] Fax: [place], [place]. [place] [address] Phone: [phone] Fax: [place] [id number] - [place], [place] [address] [name] [address] Phone: [phone] Fax: [phone]  [facility] [place], [place] [address] Phone: [phone] Fax: [phone]  Uses pill box? Yes Pt endorses 100% compliance  We discussed: Current pharmacy is preferred with insurance plan and patient is satisfied with pharmacy services Patient decided to: Continue current medication management strategy  Care Plan and Follow Up Patient  Decision:  Patient agrees to Care Plan and Follow-up.  Plan: Telephone follow up appointment with care management team member scheduled for:  01/29/2021 at 11:00 AM  [facility] [phone]

## 2020-11-05 NOTE — Telephone Encounter (Signed)
Patient called needed RF for xarelto. Prescription never sent via [employer]. Confirmed with Judy/allyson in pharmacy[facility]. [facility] [employer] coverage for medication. Patient only had 4 tablets left at home.

## 2020-11-06 ENCOUNTER — Ambulatory Visit (INDEPENDENT_AMBULATORY_CARE_PROVIDER_SITE_OTHER): Payer: Medicare Other

## 2020-11-06 DIAGNOSIS — I152 Hypertension secondary to endocrine disorders: Secondary | ICD-10-CM

## 2020-11-06 DIAGNOSIS — E1159 Type 2 diabetes mellitus with other circulatory complications: Secondary | ICD-10-CM

## 2020-11-06 DIAGNOSIS — E1169 Type 2 diabetes mellitus with other specified complication: Secondary | ICD-10-CM

## 2020-11-06 DIAGNOSIS — E1165 Type 2 diabetes mellitus with hyperglycemia: Secondary | ICD-10-CM

## 2020-11-06 DIAGNOSIS — E785 Hyperlipidemia, unspecified: Secondary | ICD-10-CM | POA: Diagnosis not present

## 2020-11-07 NOTE — Patient Instructions (Signed)
Visit Information It was great speaking with you today!  Please let me know if you have any questions about our visit.  Goals Addressed            This Visit's Progress   . Monitor and Manage My Blood Sugar-Diabetes Type 2       Timeframe:  Long-Range Goal Priority:  High Start Date:   11/06/2020                          Expected End Date: 05/09/2021                      Follow Up Date 01/29/2021    -check blood sugar once daily before breakfast  - check blood sugar if I feel it is too high or too low - enter blood sugar readings and medication or insulin into daily log    Why is this important?    Checking your blood sugar at home helps to keep it from getting very high or very low.   Writing the results in a diary or log helps the doctor know how to care for you.   Your blood sugar log should have the time, date and the results.   Also, write down the amount of insulin or other medicine that you take.   Other information, like what you ate, exercise done and how you were feeling, will also be helpful.     Notes:        Patient Care Plan: General Pharmacy (Adult)    Problem Identified: Hyperlipidemia, Diabetes, Coronary Artery Disease, Chronic Kidney Disease, BPH and Chronic DVT, and Metastatic lung cancer   Priority: High    Long-Range Goal: Patient-Specific Goal   Start Date: 11/06/2020  Expected End Date: 05/10/2021  This Visit's Progress: On track  Priority: High  Note:   Current Barriers:  . Unable to independently afford treatment regimen . Unable to achieve control of diabetes   Pharmacist Clinical Goal(s):  Marland Kitchen Over the next 90 days, patient will verbalize ability to afford treatment regimen . achieve control of diabetes as evidenced by A1c less than 8% through collaboration with PharmD and provider.   Interventions: . 1:1 collaboration with [name], MD regarding development and update of comprehensive plan of care as evidenced by provider attestation  and co-signature . Inter-disciplinary care team collaboration (see longitudinal plan of care) . Comprehensive medication review performed; medication list updated in electronic medical record  Hyperlipidemia: (LDL goal < 100) -Controlled -Current treatment: . Atorvastatin 40 mg daily  -Medications previously tried: NA  -Educated on Importance of limiting foods high in cholesterol; -Recommended to continue current medication  Diabetes (A1c goal <8%) -Controlled -Current medications: . Soliqua 15-30 units daily (15 units)  . Metformin XR 750 mg 2 tablets daily  -Medications previously tried: NA  -Current home glucose readings   Fasting   2-Mar 102   1-Mar 150   28-Feb 194   27-Feb 229   26-Feb 213   25-Feb 190   24-Feb 160 Started Soliqua  Average 177    -Reports hypoglycemic/hyperglycemic symptoms -Midday was 88. Nervous, shaky, weakness,  -Educated onA1c and blood sugar goals; Prevention and management of hypoglycemic episodes; Carbohydrate counting and/or plate method -Counseled to check feet daily and get yearly eye exams -Recommended to continue current medication  Urinary Obstruction (Goal: maintain urinary flow) -Controlled -Current treatment  . Finasteride 5 mg daily  . Tamsulosin 0.4 mg  daily  -Medications previously tried: NA  -Recommended to continue current medication  Metastatic lung cancer (Goal: Prevent worsening and complications of cancer) -Controlled -Current treatment  . Entrectinib 200 mg 2 capsules daily?  -Current anticoagulation treatment  . Xarelto 20 mg daily  -Medications previously tried: NA  -Recommended to continue current medication  Patient Goals/Self-Care Activities . Over the next 90 days, patient will:  - check glucose daily before breakfast, document, and provide at future appointments  Follow Up Plan: Telephone follow up appointment with care management team member scheduled for: 01/29/2021 at 11:00 AM      Mr.  Melvin Willis was given information about Chronic Care Management services today including:  1. CCM service includes personalized support from designated clinical staff supervised by his physician, including individualized plan of care and coordination with other care providers 2. 24/7 contact phone numbers for assistance for urgent and routine care needs. 3. Standard insurance, coinsurance, copays and deductibles apply for chronic care management only during months in which we provide at least 20 minutes of these services. Most insurances cover these services at 100%, however patients may be responsible for any copay, coinsurance and/or deductible if applicable. This service may help you avoid the need for more expensive face-to-face services. 4. Only one practitioner may furnish and bill the service in a calendar month. 5. The patient may stop CCM services at any time (effective at the end of the month) by phone call to the office staff.  Patient agreed to services and verbal consent obtained.   The patient verbalized understanding of instructions, educational materials, and care plan provided today and declined offer to receive copy of patient instructions, educational materials, and care plan.   [facility] [phone]

## 2020-11-08 ENCOUNTER — Telehealth: Payer: Self-pay

## 2020-11-08 NOTE — Chronic Care Management (AMB) (Signed)
Chronic Care Management Pharmacy Assistant   Name: Melvin Willis  MRN: [id number] DOB: Jun 28, 1951  Reason for Encounter: Patient Assistance Coordination  PCP : Melvin Sizer, MD   11/08/2020- Patient assistance forms filled out for Melvin Willis with Melvin Willis Patient assistance program and for Xarelto 20 mg with Melvin Willis patient assistance program. Spoke with patient, he is aware that I will place these forms in the mail to him, I will highlight and note everywhere he needs to sign or fill out. Patient aware of income documentation needed to have with forms. Once he is completed, patient instructed to bring both forms back to PCP office for Dr Melvin Willis to sign for Melvin Willis and Melvin Willis to fax. Patient aware we can fax forms over to Dr Melvin Willis to sign for Xarelto. Patient mentioned to take with Melvin Willis, Melvin Willis who can help get these forms faxed over also. Patient agrees and understand plan.  Melvin Willis, Melvin Willis notified.   Allergies:  No Known Allergies  Medications: Outpatient Encounter Medications as of 11/08/2020  Medication Sig  . atorvastatin (LIPITOR) 40 MG tablet Take 1 tablet (40 mg total) by mouth daily.  . blood glucose meter kit and supplies Dispense based on patient and insurance preference (Accuchek Aviva Plus, Accuchek Nano). Use up to four times daily as directed. (FOR ICD-10 E10.9, E11.9).  Melvin Willis Kitchen entrectinib (ROZLYTREK) 200 MG capsule Take 3 capsules (600 mg total) by mouth daily. (Patient taking differently: Take 600 mg by mouth daily. Pt reports taking 2 pills daily per MD)  . finasteride (PROSCAR) 5 MG tablet Take 1 tablet (5 mg total) by mouth daily.  Melvin Willis Kitchen gabapentin (NEURONTIN) 300 MG capsule Take 1 capsule by mouth at bedtime  . Insulin Glargine-Lixisenatide (SOLIQUA) 100-33 UNT-MCG/ML SOPN Inject 15-30 Units into the skin daily.  . Insulin Pen Needle (NOVOFINE PEN NEEDLE) 32G X 6 MM MISC 1 each by Does not apply route daily.  . metFORMIN (GLUCOPHAGE-XR)  750 MG 24 hr tablet Take 2 tablets (1,500 mg total) by mouth daily.  . ondansetron (ZOFRAN) 8 MG tablet One pill every 8 hours as needed for nausea/vomitting.  . rivaroxaban (XARELTO) 20 MG TABS tablet TAKE 1 TABLET BY MOUTH ONCE DAILY WITH SUPPER  . tamsulosin (FLOMAX) 0.4 MG CAPS capsule Take 1 capsule by mouth once daily in the morning   No facility-administered encounter medications on file as of 11/08/2020.    Current Diagnosis: Patient Active Problem List   Diagnosis Date Noted  . Brain metastases (Melvin Willis) 09/25/2019  . Atherosclerosis of aorta (Melvin Willis) 09/04/2019  . Goals of care, counseling/discussion 08/02/2019  . Primary malignant neoplasm of lung metastatic to other site (Melvin Willis) 04/07/2019  . Chronic deep vein thrombosis (DVT) of left lower extremity (Melvin Willis) 01/05/2018  . Chronic kidney disease (CKD), stage III (moderate) (Melvin Willis) 11/27/2016  . Old cerebrovascular accident (CVA) without late effect   . Blurred vision, bilateral 06/23/2016  . Primary cancer of left upper lobe of lung (Melvin Willis) 06/12/2016  . Mediastinal mass   . History of nephrolithiasis 03/27/2016  . Pharyngeal dysphagia 01/01/2016  . Benign neoplasm of ascending colon   . Benign neoplasm of sigmoid colon   . Benign neoplasm of transverse colon   . Diverticulosis of large intestine without diverticulitis   . Overweight (BMI 25.0-29.9) 04/16/2015  . 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 Dyslipidemia 03/18/2015  . Disorder of male genital organ 08/08/2012  . 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 Elevated PSA  08/08/2012    Follow-Up:  Patient Assistance Coordination   Melvin Willis, Melvin Willis Pharmacist Assistant [phone]

## 2020-11-14 ENCOUNTER — Emergency Department: Payer: Medicare Other

## 2020-11-14 ENCOUNTER — Other Ambulatory Visit: Payer: Self-pay

## 2020-11-14 ENCOUNTER — Inpatient Hospital Stay: Payer: Medicare Other

## 2020-11-14 ENCOUNTER — Inpatient Hospital Stay
Admission: EM | Admit: 2020-11-14 | Discharge: 2020-11-20 | DRG: 871 | Disposition: A | Discharge status: Home Health | Payer: Medicare Other | Attending: Internal Medicine | Admitting: Internal Medicine

## 2020-11-14 DIAGNOSIS — R791 Abnormal coagulation profile: Secondary | ICD-10-CM

## 2020-11-14 DIAGNOSIS — Z79899 Other long term (current) drug therapy: Secondary | ICD-10-CM | POA: Diagnosis not present

## 2020-11-14 DIAGNOSIS — I639 Cerebral infarction, unspecified: Secondary | ICD-10-CM | POA: Diagnosis not present

## 2020-11-14 DIAGNOSIS — Z20822 Contact with and (suspected) exposure to covid-19: Secondary | ICD-10-CM | POA: Diagnosis present

## 2020-11-14 DIAGNOSIS — J9811 Atelectasis: Secondary | ICD-10-CM | POA: Diagnosis present

## 2020-11-14 DIAGNOSIS — I898 Other specified noninfective disorders of lymphatic vessels and lymph nodes: Secondary | ICD-10-CM | POA: Diagnosis not present

## 2020-11-14 DIAGNOSIS — Z86718 Personal history of other venous thrombosis and embolism: Secondary | ICD-10-CM | POA: Diagnosis not present

## 2020-11-14 DIAGNOSIS — J9600 Acute respiratory failure, unspecified whether with hypoxia or hypercapnia: Secondary | ICD-10-CM

## 2020-11-14 DIAGNOSIS — R6521 Severe sepsis with septic shock: Secondary | ICD-10-CM | POA: Diagnosis not present

## 2020-11-14 DIAGNOSIS — C3490 Malignant neoplasm of unspecified part of unspecified bronchus or lung: Secondary | ICD-10-CM | POA: Diagnosis not present

## 2020-11-14 DIAGNOSIS — R0902 Hypoxemia: Secondary | ICD-10-CM | POA: Diagnosis not present

## 2020-11-14 DIAGNOSIS — R410 Disorientation, unspecified: Secondary | ICD-10-CM | POA: Diagnosis not present

## 2020-11-14 DIAGNOSIS — B179 Acute viral hepatitis, unspecified: Secondary | ICD-10-CM | POA: Diagnosis present

## 2020-11-14 DIAGNOSIS — Z7901 Long term (current) use of anticoagulants: Secondary | ICD-10-CM

## 2020-11-14 DIAGNOSIS — R531 Weakness: Secondary | ICD-10-CM | POA: Diagnosis not present

## 2020-11-14 DIAGNOSIS — R339 Retention of urine, unspecified: Secondary | ICD-10-CM | POA: Diagnosis not present

## 2020-11-14 DIAGNOSIS — N183 Chronic kidney disease, stage 3 unspecified: Secondary | ICD-10-CM

## 2020-11-14 DIAGNOSIS — Z833 Family history of diabetes mellitus: Secondary | ICD-10-CM

## 2020-11-14 DIAGNOSIS — Z923 Personal history of irradiation: Secondary | ICD-10-CM | POA: Diagnosis not present

## 2020-11-14 DIAGNOSIS — J9601 Acute respiratory failure with hypoxia: Secondary | ICD-10-CM | POA: Diagnosis not present

## 2020-11-14 DIAGNOSIS — R6520 Severe sepsis without septic shock: Secondary | ICD-10-CM | POA: Diagnosis not present

## 2020-11-14 DIAGNOSIS — R7881 Bacteremia: Secondary | ICD-10-CM | POA: Diagnosis not present

## 2020-11-14 DIAGNOSIS — N179 Acute kidney failure, unspecified: Secondary | ICD-10-CM | POA: Diagnosis not present

## 2020-11-14 DIAGNOSIS — Z515 Encounter for palliative care: Secondary | ICD-10-CM

## 2020-11-14 DIAGNOSIS — R5381 Other malaise: Secondary | ICD-10-CM | POA: Diagnosis not present

## 2020-11-14 DIAGNOSIS — G9389 Other specified disorders of brain: Secondary | ICD-10-CM | POA: Diagnosis not present

## 2020-11-14 DIAGNOSIS — A419 Sepsis, unspecified organism: Secondary | ICD-10-CM

## 2020-11-14 DIAGNOSIS — Z86711 Personal history of pulmonary embolism: Secondary | ICD-10-CM

## 2020-11-14 DIAGNOSIS — J150 Pneumonia due to Klebsiella pneumoniae: Secondary | ICD-10-CM | POA: Diagnosis present

## 2020-11-14 DIAGNOSIS — E1122 Type 2 diabetes mellitus with diabetic chronic kidney disease: Secondary | ICD-10-CM | POA: Diagnosis not present

## 2020-11-14 DIAGNOSIS — Z8249 Family history of ischemic heart disease and other diseases of the circulatory system: Secondary | ICD-10-CM

## 2020-11-14 DIAGNOSIS — A4159 Other Gram-negative sepsis: Secondary | ICD-10-CM | POA: Diagnosis present

## 2020-11-14 DIAGNOSIS — R7401 Elevation of levels of liver transaminase levels: Secondary | ICD-10-CM | POA: Diagnosis not present

## 2020-11-14 DIAGNOSIS — E7800 Pure hypercholesterolemia, unspecified: Secondary | ICD-10-CM | POA: Diagnosis present

## 2020-11-14 DIAGNOSIS — C7931 Secondary malignant neoplasm of brain: Secondary | ICD-10-CM

## 2020-11-14 DIAGNOSIS — C778 Secondary and unspecified malignant neoplasm of lymph nodes of multiple regions: Secondary | ICD-10-CM | POA: Diagnosis not present

## 2020-11-14 DIAGNOSIS — Z809 Family history of malignant neoplasm, unspecified: Secondary | ICD-10-CM

## 2020-11-14 DIAGNOSIS — I129 Hypertensive chronic kidney disease with stage 1 through stage 4 chronic kidney disease, or unspecified chronic kidney disease: Secondary | ICD-10-CM | POA: Diagnosis present

## 2020-11-14 DIAGNOSIS — E1165 Type 2 diabetes mellitus with hyperglycemia: Secondary | ICD-10-CM | POA: Diagnosis not present

## 2020-11-14 DIAGNOSIS — C3412 Malignant neoplasm of upper lobe, left bronchus or lung: Secondary | ICD-10-CM | POA: Diagnosis present

## 2020-11-14 DIAGNOSIS — N1831 Chronic kidney disease, stage 3a: Secondary | ICD-10-CM | POA: Diagnosis present

## 2020-11-14 DIAGNOSIS — R000 Tachycardia, unspecified: Secondary | ICD-10-CM | POA: Diagnosis not present

## 2020-11-14 DIAGNOSIS — Z8673 Personal history of transient ischemic attack (TIA), and cerebral infarction without residual deficits: Secondary | ICD-10-CM | POA: Diagnosis not present

## 2020-11-14 DIAGNOSIS — R5383 Other fatigue: Secondary | ICD-10-CM | POA: Diagnosis not present

## 2020-11-14 DIAGNOSIS — G9340 Encephalopathy, unspecified: Secondary | ICD-10-CM | POA: Diagnosis not present

## 2020-11-14 DIAGNOSIS — I2510 Atherosclerotic heart disease of native coronary artery without angina pectoris: Secondary | ICD-10-CM | POA: Diagnosis not present

## 2020-11-14 DIAGNOSIS — R319 Hematuria, unspecified: Secondary | ICD-10-CM | POA: Diagnosis not present

## 2020-11-14 DIAGNOSIS — N400 Enlarged prostate without lower urinary tract symptoms: Secondary | ICD-10-CM | POA: Diagnosis present

## 2020-11-14 DIAGNOSIS — N1330 Unspecified hydronephrosis: Secondary | ICD-10-CM | POA: Diagnosis not present

## 2020-11-14 DIAGNOSIS — G9341 Metabolic encephalopathy: Secondary | ICD-10-CM | POA: Diagnosis not present

## 2020-11-14 DIAGNOSIS — R4182 Altered mental status, unspecified: Secondary | ICD-10-CM | POA: Diagnosis not present

## 2020-11-14 DIAGNOSIS — S06360A Traumatic hemorrhage of cerebrum, unspecified, without loss of consciousness, initial encounter: Secondary | ICD-10-CM | POA: Diagnosis not present

## 2020-11-14 DIAGNOSIS — Z7984 Long term (current) use of oral hypoglycemic drugs: Secondary | ICD-10-CM

## 2020-11-14 DIAGNOSIS — G936 Cerebral edema: Secondary | ICD-10-CM | POA: Diagnosis not present

## 2020-11-14 DIAGNOSIS — K7689 Other specified diseases of liver: Secondary | ICD-10-CM | POA: Diagnosis not present

## 2020-11-14 DIAGNOSIS — B961 Klebsiella pneumoniae [K. pneumoniae] as the cause of diseases classified elsewhere: Secondary | ICD-10-CM | POA: Diagnosis not present

## 2020-11-14 DIAGNOSIS — N189 Chronic kidney disease, unspecified: Secondary | ICD-10-CM

## 2020-11-14 DIAGNOSIS — N390 Urinary tract infection, site not specified: Secondary | ICD-10-CM | POA: Diagnosis not present

## 2020-11-14 DIAGNOSIS — J189 Pneumonia, unspecified organism: Secondary | ICD-10-CM | POA: Diagnosis not present

## 2020-11-14 LAB — CBC WITH DIFFERENTIAL/PLATELET
Abs Immature Granulocytes: 0.03 10*3/uL (ref 0.00–0.07)
Basophils Absolute: 0 10*3/uL (ref 0.0–0.1)
Basophils Relative: 0 %
Eosinophils Absolute: 0 10*3/uL (ref 0.0–0.5)
Eosinophils Relative: 0 %
HCT: 44 % (ref 39.0–52.0)
Hemoglobin: 14.9 g/dL (ref 13.0–17.0)
Immature Granulocytes: 0 %
Lymphocytes Relative: 4 %
Lymphs Abs: 0.4 10*3/uL — ABNORMAL LOW (ref 0.7–4.0)
MCH: 29.6 pg (ref 26.0–34.0)
MCHC: 33.9 g/dL (ref 30.0–36.0)
MCV: 87.5 fL (ref 80.0–100.0)
Monocytes Absolute: 0.6 10*3/uL (ref 0.1–1.0)
Monocytes Relative: 7 %
Neutro Abs: 7.6 10*3/uL (ref 1.7–7.7)
Neutrophils Relative %: 89 %
Platelets: 205 10*3/uL (ref 150–400)
RBC: 5.03 MIL/uL (ref 4.22–5.81)
RDW: 14.7 % (ref 11.5–15.5)
Smear Review: NORMAL
WBC: 8.6 10*3/uL (ref 4.0–10.5)
nRBC: 0 % (ref 0.0–0.2)

## 2020-11-14 LAB — COMPREHENSIVE METABOLIC PANEL
ALT: 1358 U/L — ABNORMAL HIGH (ref 0–44)
AST: 1838 U/L — ABNORMAL HIGH (ref 15–41)
Albumin: 4.1 g/dL (ref 3.5–5.0)
Alkaline Phosphatase: 211 U/L — ABNORMAL HIGH (ref 38–126)
Anion gap: 13 (ref 5–15)
BUN: 31 mg/dL — ABNORMAL HIGH (ref 8–23)
CO2: 21 mmol/L — ABNORMAL LOW (ref 22–32)
Calcium: 9.5 mg/dL (ref 8.9–10.3)
Chloride: 101 mmol/L (ref 98–111)
Creatinine, Ser: 2.18 mg/dL — ABNORMAL HIGH (ref 0.61–1.24)
GFR, Estimated: 32 mL/min — ABNORMAL LOW (ref >60)
Glucose, Bld: 252 mg/dL — ABNORMAL HIGH (ref 70–99)
Potassium: 5 mmol/L (ref 3.5–5.1)
Sodium: 135 mmol/L (ref 135–145)
Total Bilirubin: 4.5 mg/dL — ABNORMAL HIGH (ref 0.3–1.2)
Total Protein: 7.8 g/dL (ref 6.5–8.1)

## 2020-11-14 LAB — LACTIC ACID, PLASMA
Lactic Acid, Venous: 5.3 mmol/L (ref 0.5–1.9)
Lactic Acid, Venous: 4.3 mmol/L (ref 0.5–1.9)
Lactic Acid, Venous: 3.6 mmol/L (ref 0.5–1.9)

## 2020-11-14 LAB — TROPONIN I (HIGH SENSITIVITY)
Troponin I (High Sensitivity): 130 ng/L (ref <18)
Troponin I (High Sensitivity): 175 ng/L (ref <18)

## 2020-11-14 LAB — CBG MONITORING, ED: Glucose-Capillary: 266 mg/dL — ABNORMAL HIGH (ref 70–99)

## 2020-11-14 LAB — BRAIN NATRIURETIC PEPTIDE: B Natriuretic Peptide: 151.4 pg/mL — ABNORMAL HIGH (ref 0.0–100.0)

## 2020-11-14 LAB — PROTIME-INR
INR: 3 — ABNORMAL HIGH (ref 0.8–1.2)
Prothrombin Time: 30 seconds — ABNORMAL HIGH (ref 11.4–15.2)

## 2020-11-14 LAB — RESP PANEL BY RT-PCR (FLU A&B, COVID) ARPGX2
Influenza A by PCR: NEGATIVE
Influenza B by PCR: NEGATIVE
SARS Coronavirus 2 by RT PCR: NEGATIVE

## 2020-11-14 LAB — APTT: aPTT: 37 seconds — ABNORMAL HIGH (ref 24–36)

## 2020-11-14 MED ORDER — GADOBUTROL 1 MMOL/ML IV SOLN
8.0000 mL | Freq: Once | INTRAVENOUS | Status: AC | PRN
  Administered 2020-11-14: 7.5 mL via INTRAVENOUS

## 2020-11-14 MED ORDER — LACTATED RINGERS IV SOLN: INTRAVENOUS | Status: DC

## 2020-11-14 MED ORDER — IOHEXOL 350 MG/ML SOLN
60.0000 mL | Freq: Once | INTRAVENOUS | Status: DC | PRN
  Filled 2020-11-14: qty 60

## 2020-11-14 MED ORDER — LEVETIRACETAM 500 MG PO TABS
500.0000 mg | ORAL_TABLET | Freq: Once | ORAL | Status: AC
  Administered 2020-11-14: 500 mg via ORAL
  Filled 2020-11-14: qty 1

## 2020-11-14 MED ORDER — SODIUM CHLORIDE 0.9 % IV SOLN
2.0000 g | Freq: Once | INTRAVENOUS | Status: AC
  Administered 2020-11-14: 2 g via INTRAVENOUS
  Filled 2020-11-14: qty 2

## 2020-11-14 MED ORDER — SODIUM CHLORIDE 0.9 % IV SOLN: INTRAVENOUS | Status: DC

## 2020-11-14 MED ORDER — METRONIDAZOLE IN NACL 5-0.79 MG/ML-% IV SOLN
500.0000 mg | Freq: Three times a day (TID) | INTRAVENOUS | Status: DC
  Administered 2020-11-14: 500 mg via INTRAVENOUS
  Filled 2020-11-14: qty 100

## 2020-11-14 MED ORDER — INSULIN ASPART 100 UNIT/ML ~~LOC~~ SOLN
0.0000 [IU] | Freq: Every day | SUBCUTANEOUS | Status: DC
  Administered 2020-11-14 – 2020-11-15 (×2): 3 [IU] via SUBCUTANEOUS
  Administered 2020-11-16: 21:00:00 2 [IU] via SUBCUTANEOUS
  Administered 2020-11-17: 3 [IU] via SUBCUTANEOUS
  Administered 2020-11-18: 4 [IU] via SUBCUTANEOUS
  Filled 2020-11-14 (×5): qty 1

## 2020-11-14 MED ORDER — LEVETIRACETAM IN NACL 500 MG/100ML IV SOLN
500.0000 mg | Freq: Two times a day (BID) | INTRAVENOUS | Status: DC
  Filled 2020-11-14 (×2): qty 100

## 2020-11-14 MED ORDER — GABAPENTIN 300 MG PO CAPS
300.0000 mg | ORAL_CAPSULE | Freq: Every day | ORAL | Status: DC
  Administered 2020-11-14 – 2020-11-19 (×6): 300 mg via ORAL
  Filled 2020-11-14 (×6): qty 1

## 2020-11-14 MED ORDER — INSULIN ASPART 100 UNIT/ML ~~LOC~~ SOLN
0.0000 [IU] | Freq: Three times a day (TID) | SUBCUTANEOUS | Status: DC
  Administered 2020-11-15: 11 [IU] via SUBCUTANEOUS
  Administered 2020-11-15 – 2020-11-16 (×3): 5 [IU] via SUBCUTANEOUS
  Administered 2020-11-16 – 2020-11-18 (×6): 8 [IU] via SUBCUTANEOUS
  Administered 2020-11-18: 15 [IU] via SUBCUTANEOUS
  Administered 2020-11-18: 10:00:00 5 [IU] via SUBCUTANEOUS
  Administered 2020-11-19 (×2): 8 [IU] via SUBCUTANEOUS
  Administered 2020-11-20 (×2): 3 [IU] via SUBCUTANEOUS
  Filled 2020-11-14 (×15): qty 1

## 2020-11-14 MED ORDER — LEVETIRACETAM IN NACL 500 MG/100ML IV SOLN
500.0000 mg | Freq: Two times a day (BID) | INTRAVENOUS | Status: DC
  Administered 2020-11-15 – 2020-11-20 (×11): 500 mg via INTRAVENOUS
  Filled 2020-11-14 (×14): qty 100

## 2020-11-14 MED ORDER — LACTATED RINGERS IV BOLUS
500.0000 mL | Freq: Once | INTRAVENOUS | Status: AC
  Administered 2020-11-14: 500 mL via INTRAVENOUS

## 2020-11-14 MED ORDER — LEVETIRACETAM 500 MG PO TABS: 500.0000 mg | ORAL_TABLET | Freq: Two times a day (BID) | ORAL | Status: DC

## 2020-11-14 MED ORDER — DEXAMETHASONE SODIUM PHOSPHATE 10 MG/ML IJ SOLN
8.0000 mg | Freq: Once | INTRAMUSCULAR | Status: AC
  Administered 2020-11-14: 8 mg via INTRAVENOUS
  Filled 2020-11-14: qty 1

## 2020-11-14 MED ORDER — ENTRECTINIB 200 MG PO CAPS: 600.0000 mg | ORAL_CAPSULE | Freq: Every day | ORAL | Status: DC

## 2020-11-14 MED ORDER — LACTATED RINGERS IV BOLUS
2000.0000 mL | Freq: Once | INTRAVENOUS | Status: AC
  Administered 2020-11-14: 2000 mL via INTRAVENOUS

## 2020-11-14 MED ORDER — VANCOMYCIN HCL IN DEXTROSE 1-5 GM/200ML-% IV SOLN
1000.0000 mg | Freq: Once | INTRAVENOUS | Status: AC
  Administered 2020-11-14: 1000 mg via INTRAVENOUS
  Filled 2020-11-14: qty 200

## 2020-11-14 MED ORDER — DEXAMETHASONE SODIUM PHOSPHATE 10 MG/ML IJ SOLN
10.0000 mg | INTRAMUSCULAR | Status: DC
  Administered 2020-11-15: 10 mg via INTRAVENOUS
  Filled 2020-11-14: qty 1

## 2020-11-14 NOTE — ED Provider Notes (Signed)
[facility] Emergency Department Provider Note   ____________________________________________   Event Date/Time   First MD Initiated Contact with Patient 11/14/20 1507     (approximate)  I have reviewed the triage vital signs and the nursing notes.   HISTORY  Chief Complaint Weakness  EM caveat: Confusion, disorientation  HPI Melvin Willis is a 70 y.o. male history of DVT, prior stroke, pulmonary embolism and stage IV lung cancer   Wife reports he just seemed out of energy today.  Normally gets himself up at about 630 but today just 1 to stay in bed he was too weak and tired to get up.  They called EMS because of his weakness.  He denies being in pain or discomfort.  He is definitely confused, his wife however reports that this is intermittent and they think it is from his previous radiation  Patient reports that he is at a bank.  He denies being in any pain.  He does not know what year or month it is.  Denies being in pain now just feels tired  Past Medical History:  Diagnosis Date  . Abnormal prostate specific antigen 08/08/2012  . Adiposity 04/16/2015  . Chronic kidney disease (CKD), stage III (moderate) ([place]) 11/27/2016  . CVA (cerebral vascular accident) ([place]) 06/17/2016  . Diabetes mellitus without complication ([place])   . Diverticulosis of sigmoid colon 04/16/2015  . Dyslipidemia 03/18/2015  . Hemorrhoids, internal 04/16/2015  . Hypercholesteremia 04/16/2015  . Hyperlipidemia   . Hypertension   . Primary cancer of left upper lobe of lung ([place])   . Pulmonary embolism ([place])   . Wears dentures    partial upper    Patient Active Problem List   Diagnosis Date Noted  . Brain metastases ([place]) 09/25/2019  . Atherosclerosis of aorta ([place]) 09/04/2019  . Goals of care, counseling/discussion 08/02/2019  . Primary malignant neoplasm of lung metastatic to other site ([place]) 04/07/2019  . Chronic deep vein thrombosis (DVT) of left lower extremity ([place])  01/05/2018  . Chronic kidney disease (CKD), stage III (moderate) ([place]) 11/27/2016  . Old cerebrovascular accident (CVA) without late effect   . Blurred vision, bilateral 06/23/2016  . Primary cancer of left upper lobe of lung ([place]) 06/12/2016  . Mediastinal mass   . History of nephrolithiasis 03/27/2016  . Pharyngeal dysphagia 01/01/2016  . Benign neoplasm of ascending colon   . Benign neoplasm of sigmoid colon   . Benign neoplasm of transverse colon   . Diverticulosis of large intestine without diverticulitis   . Overweight (BMI 25.0-29.9) 04/16/2015  . Controlled diabetes mellitus with chronic kidney disease ([place]) 03/18/2015  . Dyslipidemia 03/18/2015  . Disorder of male genital organ 08/08/2012  . 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 Elevated PSA 08/08/2012    Past Surgical History:  Procedure Laterality Date  . COLONOSCOPY    . COLONOSCOPY WITH PROPOFOL N/A 05/06/2015   Procedure: COLONOSCOPY WITH PROPOFOL;  Surgeon: Lucilla Lame, MD;  Location: [place];  Service: Endoscopy;  Laterality: N/A;  ASCENDING COLON POLYPS X 2 TERMINAL ILEUM BIOPSY RANDOM COLON BX. TRANSVERSE COLON POLYP SIGMOID COLON POLYP  . ESOPHAGOGASTRODUODENOSCOPY (EGD) WITH PROPOFOL N/A 05/06/2015   Procedure: ESOPHAGOGASTRODUODENOSCOPY (EGD) WITH PROPOFOL;  Surgeon: Lucilla Lame, MD;  Location: [place];  Service: Endoscopy;  Laterality: N/A;  GASTRIC BIOPSY X1  . KIDNEY STONE SURGERY  2017    Prior to Admission medications   Medication Sig Start Date End Date Taking? Authorizing Provider  atorvastatin (  LIPITOR) 40 MG tablet Take 1 tablet (40 mg total) by mouth daily. 10/31/20   [name], MD  blood glucose meter kit and supplies Dispense based on patient and insurance preference (Accuchek Aviva Plus, Accuchek Nano). Use up to four times daily as directed. (FOR ICD-10 E10.9, E11.9). 05/08/19   [name], MD  entrectinib (ROZLYTREK) 200 MG capsule Take 3  capsules (600 mg total) by mouth daily. Patient taking differently: Take 600 mg by mouth daily. Pt reports taking 2 pills daily per MD 11/16/19   Cammie Sickle, MD  finasteride (PROSCAR) 5 MG tablet Take 1 tablet (5 mg total) by mouth daily. 06/13/20   Billey Co, MD  gabapentin (NEURONTIN) 300 MG capsule Take 1 capsule by mouth at bedtime 10/22/20   Cammie Sickle, MD  Insulin Glargine-Lixisenatide (SOLIQUA) 100-33 UNT-MCG/ML SOPN Inject 15-30 Units into the skin daily. 10/31/20   [name], MD  Insulin Pen Needle (NOVOFINE PEN NEEDLE) 32G X 6 MM MISC 1 each by Does not apply route daily. 10/31/20   [name], MD  metFORMIN (GLUCOPHAGE-XR) 750 MG 24 hr tablet Take 2 tablets (1,500 mg total) by mouth daily. 10/22/20   [name], MD  ondansetron (ZOFRAN) 8 MG tablet One pill every 8 hours as needed for nausea/vomitting. 03/22/19   Cammie Sickle, MD  rivaroxaban (XARELTO) 20 MG TABS tablet TAKE 1 TABLET BY MOUTH ONCE DAILY WITH SUPPER 11/05/20   Cammie Sickle, MD  tamsulosin (FLOMAX) 0.4 MG CAPS capsule Take 1 capsule by mouth once daily in the morning 12/11/19   Stoioff, Ronda Fairly, MD    Allergies Patient has no known allergies.  Family History  Problem Relation Age of Onset  . Diabetes Mother   . Diabetes Father   . CAD Father   . Dementia Father   . Diabetes Sister   . Cancer Maternal Uncle        Prostate  . Cancer Cousin        prostate    Social History Social History   Tobacco Use  . Smoking status: Never Smoker  . Smokeless tobacco: Never Used  Vaping Use  . Vaping Use: Never used  Substance Use Topics  . Alcohol use: No    Alcohol/week: 0.0 standard drinks  . Drug use: No    Review of Systems Constitutional: Fever here.  Very fatigued.  Denies any focal weakness.-0 4 of note please note wife provides majority history Eyes: No visual changes. ENT: No sore throat. Cardiovascular: Denies chest pain. Respiratory: Looks a  little short of breath for the wife. Gastrointestinal: No abdominal pain.   Genitourinary: Unknown Musculoskeletal: Negative for back pain. Skin: Negative for rash. Neurological: Negative for headaches, areas of focal weakness or numbness.  He does have confusion but wife reports that seems to come and go on a regular basis and they think it is due to previous radiation to the brain    ____________________________________________   PHYSICAL EXAM:  VITAL SIGNS: ED Triage Vitals  Enc Vitals Group     BP 11/14/20 1434 (!) 130/95     Pulse Rate 11/14/20 1434 100     Resp 11/14/20 1434 20     Temp 11/14/20 1434 (!) 100.4 F (38 C)     Temp Source 11/14/20 1434 Oral     SpO2 11/14/20 1434 (!) 88 %     Weight --      Height --      Head Circumference --  Peak Flow --      Pain Score 11/14/20 1435 0     Pain Loc --      Pain Edu? --      Excl. in [place]? --     Constitutional: Alert and oriented to wife but otherwise not oriented.  Very fatigued but in no acute distress Eyes: Conjunctivae are normal. Head: Atraumatic. Nose: No congestion/rhinnorhea. Mouth/Throat: Mucous membranes are dry. Neck: No stridor.  Cardiovascular: Slightly tachycardic rate, regular rhythm. Grossly normal heart sounds.  Good peripheral circulation. Respiratory: Just very slightly tachypneic.  No retractions. Lungs CTAB except for some slight crackles in the right lower base. Gastrointestinal: Soft and nontender. No distention. Musculoskeletal: No lower extremity tenderness nor edema. Neurologic: Soft spoken.  No focal deficits noted.  Equal facial expressions.  Moves extremities not able to sit himself up because of weakness but is able to utilize both sides arms and legs Skin:  Skin is warm, dry and intact. No rash noted. Psychiatric: Mood and affect are very calm somewhat flat.   ____________________________________________   LABS (all labs ordered are listed, but only abnormal results are  displayed)  Labs Reviewed  LACTIC ACID, PLASMA - Abnormal; Notable for the following components:      Result Value   Lactic Acid, Venous 5.3 (*)    All other components within normal limits  LACTIC ACID, PLASMA - Abnormal; Notable for the following components:   Lactic Acid, Venous 4.3 (*)    All other components within normal limits  COMPREHENSIVE METABOLIC PANEL - Abnormal; Notable for the following components:   CO2 21 (*)    Glucose, Bld 252 (*)    BUN 31 (*)    Creatinine, Ser 2.18 (*)    AST 1,838 (*)    ALT 1,358 (*)    Alkaline Phosphatase 211 (*)    Total Bilirubin 4.5 (*)    GFR, Estimated 32 (*)    All other components within normal limits  CBC WITH DIFFERENTIAL/PLATELET - Abnormal; Notable for the following components:   Lymphs Abs 0.4 (*)    All other components within normal limits  PROTIME-INR - Abnormal; Notable for the following components:   Prothrombin Time 30.0 (*)    INR 3.0 (*)    All other components within normal limits  APTT - Abnormal; Notable for the following components:   aPTT 37 (*)    All other components within normal limits  BRAIN NATRIURETIC PEPTIDE - Abnormal; Notable for the following components:   B Natriuretic Peptide 151.4 (*)    All other components within normal limits  TROPONIN I (HIGH SENSITIVITY) - Abnormal; Notable for the following components:   Troponin I (High Sensitivity) 130 (*)    All other components within normal limits  RESP PANEL BY RT-PCR (FLU A&B, COVID) ARPGX2  CULTURE, BLOOD (ROUTINE X 2)  CULTURE, BLOOD (ROUTINE X 2)  URINE CULTURE  URINALYSIS, COMPLETE (UACMP) WITH MICROSCOPIC  TROPONIN I (HIGH SENSITIVITY)   ____________________________________________  EKG  Reviewed and try me at 1445 Heart rate 100 QRS 90 QTc 430 Sinus tachycardia ____________________________________________  RADIOLOGY  CT ABDOMEN PELVIS WO CONTRAST  Result Date: 11/14/2020 CLINICAL DATA:  Lung cancer with brain metastases,  febrile with acute renal failure and sepsis. 70 year old male. EXAM: CT CHEST, ABDOMEN AND PELVIS WITHOUT CONTRAST TECHNIQUE: Multidetector CT imaging of the chest, abdomen and pelvis was performed following the standard protocol without IV contrast. COMPARISON:  August 19, 2020 FINDINGS: CT CHEST FINDINGS Cardiovascular: Heart size accentuated  by low lung volumes likely mildly enlarged. Three-vessel coronary artery disease. No pericardial effusion. Aortic caliber is normal. Scattered atherosclerosis. Central pulmonary vasculature with normal caliber. Limited assessment of cardiovascular structures given lack of intravenous contrast. Mediastinum/Nodes: No mediastinal lymphadenopathy. Calcified lymph nodes in the chest are unchanged about the AP window. Small pre-vascular lymph node on image 22 unchanged at 6 mm. Lungs/Pleura: Interval development of ground-glass and septal thickening in the RIGHT chest, parenchymal assessment limited by respiratory motion. No lobar level consolidative changes. Airways are patent. No pleural effusion. LEFT hilar distortion in the setting of post treatment changes in the LEFT chest unchanged from previous imaging. Musculoskeletal: See below for full musculoskeletal details. No acute bone finding about the bony thorax. CT ABDOMEN PELVIS FINDINGS Hepatobiliary: Hepatic cysts as before. Septation in the largest with subtle calcification unchanged. Largest measuring approximately 4 cm. Partially intrahepatic gallbladder without pericholecystic stranding. No biliary duct distension. Pancreas: Pancreas without contour abnormality. Subtle stranding may be present about the head of the pancreas versus motion artifact. No gross ductal dilation. Spleen: Spleen normal size and contour. Adrenals/Urinary Tract: Adrenal glands are normal. Renal cortical scarring bilaterally. No signs of frank hydronephrosis. Urinary bladder with smooth contours. Marked prostatomegaly is similar to the prior study  with impression upon the bladder base Stomach/Bowel: Stomach is under distended. Mild gastric thickening suggested. Moderate-sized duodenal diverticulum without surrounding stranding. No sign of bowel obstruction or acute bowel process. Colonic diverticulosis. Vascular/Lymphatic: Aortic atheromatous plaque with calcification, no aneurysmal dilation of the abdominal aorta. There is no gastrohepatic or hepatoduodenal ligament lymphadenopathy. No retroperitoneal or mesenteric lymphadenopathy. No pelvic sidewall lymphadenopathy. Reproductive: Prostatomegaly as before. Other: No ascites.  No free air. Musculoskeletal: No acute bone finding. No destructive bone process. IMPRESSION: 1. Interval development of ground-glass and septal thickening in the RIGHT chest, parenchymal assessment limited by respiratory motion. Findings appear to be primarily related to atelectasis. Developing infection or even early viral or atypical process could also have this appearance. 2. Unchanged appearance of post treatment changes in the LEFT chest. 3. Subtle stranding may be present about the head of the pancreas versus motion artifact. Correlate with pancreatic enzymes. 4. Under distension versus mild gastric thickening. Could be related to gastritis 5. Marked prostatomegaly as before. 6. Three-vessel coronary artery disease. 7. Colonic diverticulosis without evidence of acute bowel process. 8. Hepatic cysts. 9. Aortic atherosclerosis. Aortic Atherosclerosis (ICD10-I70.0). Electronically Signed   By: Zetta Bills M.D.   On: 11/14/2020 17:46   CT Head Wo Contrast  Result Date: 11/14/2020 CLINICAL DATA:  Delirium. Lung cancer with brain metastases. Lethargic and febrile with sepsis. EXAM: CT HEAD WITHOUT CONTRAST TECHNIQUE: Contiguous axial images were obtained from the base of the skull through the vertex without intravenous contrast. COMPARISON:  MRI 06/04/2020. FINDINGS: Brain: Hemorrhagic metastases in the left frontal lobe and  anterior left temporal lobe. Comparison across modalities to the prior MRI is difficult, but there is suggestion of interval increase in size of these lesions which may relate to interval internal hemorrhage. There is also suggestion of increased vasogenic edema surrounding the left temporal metastasis. Additional metastases are not well characterized on this noncontrast head CT. Parieto-occipital encephalomalacia and white matter changes are grossly similar cross modalities. Vascular: Calcific atherosclerosis. No hyperdense vessel identified. Skull: No acute fracture. Sinuses/Orbits: Mild paranasal sinus mucosal thickening. Other: No mastoid effusions. IMPRESSION: 1. Comparison across modalities to the prior MRI is difficult, but there is suggestion of interval increase in size of hemorrhagic metastases in the left frontal lobe  and left temporal lobe which may relate to interval internal hemorrhage. There is also suggestion of increased vasogenic edema surrounding the left temporal metastasis. An MRI with contrast could further characterize and also better evaluate for new metastases if clinically indicated. 2. Additional known metastases are poorly evaluated on this noncontrast head CT. 3. Grossly similar parieto-occipital encephalomalacia and white matter changes. Findings discussed with Dr. Jacqualine Code Via telephone at 5:26 PM. Electronically Signed   By: Margaretha Sheffield MD   On: 11/14/2020 17:30   CT Chest Wo Contrast  Result Date: 11/14/2020 CLINICAL DATA:  Lung cancer with brain metastases, febrile with acute renal failure and sepsis. 70 year old male. EXAM: CT CHEST, ABDOMEN AND PELVIS WITHOUT CONTRAST TECHNIQUE: Multidetector CT imaging of the chest, abdomen and pelvis was performed following the standard protocol without IV contrast. COMPARISON:  August 19, 2020 FINDINGS: CT CHEST FINDINGS Cardiovascular: Heart size accentuated by low lung volumes likely mildly enlarged. Three-vessel coronary artery  disease. No pericardial effusion. Aortic caliber is normal. Scattered atherosclerosis. Central pulmonary vasculature with normal caliber. Limited assessment of cardiovascular structures given lack of intravenous contrast. Mediastinum/Nodes: No mediastinal lymphadenopathy. Calcified lymph nodes in the chest are unchanged about the AP window. Small pre-vascular lymph node on image 22 unchanged at 6 mm. Lungs/Pleura: Interval development of ground-glass and septal thickening in the RIGHT chest, parenchymal assessment limited by respiratory motion. No lobar level consolidative changes. Airways are patent. No pleural effusion. LEFT hilar distortion in the setting of post treatment changes in the LEFT chest unchanged from previous imaging. Musculoskeletal: See below for full musculoskeletal details. No acute bone finding about the bony thorax. CT ABDOMEN PELVIS FINDINGS Hepatobiliary: Hepatic cysts as before. Septation in the largest with subtle calcification unchanged. Largest measuring approximately 4 cm. Partially intrahepatic gallbladder without pericholecystic stranding. No biliary duct distension. Pancreas: Pancreas without contour abnormality. Subtle stranding may be present about the head of the pancreas versus motion artifact. No gross ductal dilation. Spleen: Spleen normal size and contour. Adrenals/Urinary Tract: Adrenal glands are normal. Renal cortical scarring bilaterally. No signs of frank hydronephrosis. Urinary bladder with smooth contours. Marked prostatomegaly is similar to the prior study with impression upon the bladder base Stomach/Bowel: Stomach is under distended. Mild gastric thickening suggested. Moderate-sized duodenal diverticulum without surrounding stranding. No sign of bowel obstruction or acute bowel process. Colonic diverticulosis. Vascular/Lymphatic: Aortic atheromatous plaque with calcification, no aneurysmal dilation of the abdominal aorta. There is no gastrohepatic or hepatoduodenal  ligament lymphadenopathy. No retroperitoneal or mesenteric lymphadenopathy. No pelvic sidewall lymphadenopathy. Reproductive: Prostatomegaly as before. Other: No ascites.  No free air. Musculoskeletal: No acute bone finding. No destructive bone process. IMPRESSION: 1. Interval development of ground-glass and septal thickening in the RIGHT chest, parenchymal assessment limited by respiratory motion. Findings appear to be primarily related to atelectasis. Developing infection or even early viral or atypical process could also have this appearance. 2. Unchanged appearance of post treatment changes in the LEFT chest. 3. Subtle stranding may be present about the head of the pancreas versus motion artifact. Correlate with pancreatic enzymes. 4. Under distension versus mild gastric thickening. Could be related to gastritis 5. Marked prostatomegaly as before. 6. Three-vessel coronary artery disease. 7. Colonic diverticulosis without evidence of acute bowel process. 8. Hepatic cysts. 9. Aortic atherosclerosis. Aortic Atherosclerosis (ICD10-I70.0). Electronically Signed   By: Zetta Bills M.D.   On: 11/14/2020 17:46    CT head imaging discussed with Dr. Cari Caraway.  Additionally discussed with radiologist  CT abdomen pelvis reviewed, multiple findings reviewed as  above.  Given the patient's presentation fever and R[place] noted in the right lower lobe I suspect likely pneumonia in the right lower lobe. ____________________________________________   PROCEDURES  Procedure(s) performed: None  Procedures  Critical Care performed: Yes, see critical care note(s)  CRITICAL CARE Performed by: Delman Kitten   Total critical care time: 55 minutes  Critical care time was exclusive of separately billable procedures and treating other patients.  Critical care was necessary to treat or prevent imminent or life-threatening deterioration.  Critical care was time spent personally by me on the following activities:  development of treatment plan with patient and/or surrogate as well as nursing, discussions with consultants, evaluation of patient's response to treatment, examination of patient, obtaining history from patient or surrogate, ordering and performing treatments and interventions, ordering and review of laboratory studies, ordering and review of radiographic studies, pulse oximetry and re-evaluation of patient's condition.  ____________________________________________   INITIAL IMPRESSION / ASSESSMENT AND PLAN / ED COURSE  Pertinent labs & imaging results that were available during my care of the patient were reviewed by me and considered in my medical decision making (see chart for details).   Concern for sepsis with weakness and fatigue.  No focal neurologic deficits but given his history and confusion will also obtain CT of the head to evaluate for hemorrhage or possible large amount of edema given his confusion and history of brain mets.  Additionally, obtain CT of the chest, I wish to evaluate and exclude PE especially given his use of Xarelto and known DVT pulmonary embolism now presenting with some hypoxia without cough or chest pain.  Doubt ACS.  Send screening labs.  Broad differential.  Sepsis work-up as well.  No obvious source by exam alone except for some crackles in the right base which may be indicative of pneumonia thus will initiate antibiotic coverage as patient does have evidence of possible sepsis but not definitive at this point  Clinical Course as of 11/14/20 1832  Thu Nov 14, 2020  1557 Code Sepsis. Lactic acid > 4. Large volume fluid resus ordered.  [MQ]  0762 Discussed CT of the head extensively with radiologist, he advises very difficult to determine if there is new hemorrhage and areas of existing or new metastases, he recommends that an MRI may allow for better comparison is uncertain but cannot exclude the possibility of new hemorrhage as well.  In the patient setting of  anticoagulation this is of course certainly concerning, thankfully patient does not complain of headache but does have altered mental status though could very well be explained by lab abnormalities and possible sepsis. [MQ]  1735 Discussed with Dr. Cari Caraway, neurosurgery concerns for mets and possible ich. Advises non-operative management, obtain MRI to further detail and eval if actually new ICH.  [MQ]  1736 Nsurg advsies hold xarelto. [MQ]  1808 Sepsis reassessment complete Lactate downtrending, pressure normal to hypertensive.  Patient alert oriented to self still disoriented to situation.  Awaiting admission to the hospitalist service, antibiotics initiated. [MQ]    Clinical Course User Index [MQ] Delman Kitten, MD    ----------------------------------------- 6:33 PM on 11/14/2020 -----------------------------------------  Admission discussed with hospitalist Dr. Flossie Buffy.  Did discuss neurosurgical recommendations, recommendation for MRI, need for further work-up due to significant transaminitis, alteration in mental status, brain metastases and concern for septic shock.  Patient's wife at the bedside, understand agreeable with plan for admission.  Patient resting reports he feels okay.  He is however remains altered, more alert now but still  does not know where he is but only recognizes wife.  Not aware of the situation ____________________________________________   FINAL CLINICAL IMPRESSION(S) / ED DIAGNOSES  Final diagnoses:  Septic shock ([place])  Brain metastasis ([place])  Delirium        Note:  This document was prepared using Dragon voice recognition software and may include unintentional dictation errors       Delman Kitten, MD 11/14/20 1834

## 2020-11-14 NOTE — H&P (Addendum)
History and Physical    Melvin Willis:[id number] DOB: January 01, 1951 DOA: 11/14/2020  PCP: [name], MD  Patient coming from: Home, wife at bedside  I have personally briefly reviewed patient's old medical records in [place]  Chief Complaint: Altered mental status  HPI: Melvin Willis is a 70 y.o. 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  Per wife, patient was very lethargic this morning when she tried to wake him.  He would open his eyes but falls back immediately to sleep.  He appeared to be disoriented and did not seem to know where he was at.  He complained of having left leg pain but his leg was hanging off the bed which his wife thinks is the cause of his pain.  He has history of DVT but is compliant with his Xarelto and took it yesterday.  She says that the day prior he had nausea and vomiting as well as diarrhea.  2 days ago also complained of abdominal bloating which wife states that sometimes is normal for him since he eats spicy food.  He did have a salad yesterday which the wife did not eat.  Wife at home does not have similar GI symptoms. She denies him having any labored respirations or coughing.  ED Course: He was febrile up to 100.4 with elevated WBC from prior.  Lactate of 5.3 which improved to 4.3 following 2500 cc of IV LR fluid resuscitation.  He also had elevated creatinine of 2.18 from a prior of 1.6.  His AST and ALT were significantly elevated over thousand.  Alk phos of 211.  Total bilirubin of 4.5. INR of 3.  CT head shows changes in the size of hemorrhagic metastasis with increased vasogenic edema.  CT of the chest shows groundglass appearance of the right chest that could be atelectasis versus early infection.  CT abdomen shows possible  gastritis.  No significant findings around the liver or gallbladder.  Review of Systems:  Unable to obtain given patient's altered mental status  Past Medical History:  Diagnosis Date  . Abnormal prostate specific antigen 08/08/2012  . Adiposity 04/16/2015  . Chronic kidney disease (CKD), stage III (moderate) ([place]) 11/27/2016  . CVA (cerebral vascular accident) ([place]) 06/17/2016  . Diabetes mellitus without complication ([place])   . Diverticulosis of sigmoid colon 04/16/2015  . Dyslipidemia 03/18/2015  . Hemorrhoids, internal 04/16/2015  . Hypercholesteremia 04/16/2015  . Hyperlipidemia   . Hypertension   . Primary cancer of left upper lobe of lung ([place])   . Pulmonary embolism ([place])   . Wears dentures    partial upper    Past Surgical History:  Procedure Laterality Date  . COLONOSCOPY    . COLONOSCOPY WITH PROPOFOL N/A 05/06/2015   Procedure: COLONOSCOPY WITH PROPOFOL;  Surgeon: Lucilla Lame, MD;  Location: [facility];  Service: Endoscopy;  Laterality: N/A;  ASCENDING COLON POLYPS X 2 TERMINAL ILEUM BIOPSY RANDOM COLON BX. TRANSVERSE COLON POLYP SIGMOID COLON POLYP  . ESOPHAGOGASTRODUODENOSCOPY (EGD) WITH PROPOFOL N/A 05/06/2015   Procedure: ESOPHAGOGASTRODUODENOSCOPY (EGD) WITH PROPOFOL;  Surgeon: Lucilla Lame, MD;  Location: [facility];  Service: Endoscopy;  Laterality: N/A;  GASTRIC BIOPSY X1  . KIDNEY STONE SURGERY  2017     reports that he has never smoked. He has never used smokeless tobacco.  He reports that he does not drink alcohol and does not use drugs. Social History  No Known Allergies  Family History  Problem Relation Age of Onset  . Diabetes Mother   . Diabetes Father   . CAD Father   . Dementia Father   . Diabetes Sister   . Cancer Maternal Uncle        Prostate  . Cancer Cousin        prostate     Prior to Admission medications   Medication Sig Start Date End Date Taking? Authorizing Provider  atorvastatin (LIPITOR) 40 MG tablet Take 1 tablet  (40 mg total) by mouth daily. 10/31/20  Yes [name], MD  blood glucose meter kit and supplies Dispense based on patient and insurance preference (Accuchek Aviva Plus, Accuchek Nano). Use up to four times daily as directed. (FOR ICD-10 E10.9, E11.9). 05/08/19  Yes Sowles, Drue Stager, MD  entrectinib (ROZLYTREK) 200 MG capsule Take 3 capsules (600 mg total) by mouth daily. Patient taking differently: Take 600 mg by mouth daily. Pt reports taking 2 pills daily per MD 11/16/19  Yes Cammie Sickle, MD  finasteride (PROSCAR) 5 MG tablet Take 1 tablet (5 mg total) by mouth daily. 06/13/20  Yes Billey Co, MD  gabapentin (NEURONTIN) 300 MG capsule Take 1 capsule by mouth at bedtime 10/22/20  Yes Cammie Sickle, MD  Insulin Glargine-Lixisenatide (SOLIQUA) 100-33 UNT-MCG/ML SOPN Inject 15-30 Units into the skin daily. 10/31/20  Yes Sowles, Drue Stager, MD  Insulin Pen Needle (NOVOFINE PEN NEEDLE) 32G X 6 MM MISC 1 each by Does not apply route daily. 10/31/20  Yes Sowles, Drue Stager, MD  metFORMIN (GLUCOPHAGE-XR) 750 MG 24 hr tablet Take 2 tablets (1,500 mg total) by mouth daily. Patient taking differently: Take 750 mg by mouth 2 (two) times daily. 10/22/20  Yes Sowles, Drue Stager, MD  ondansetron (ZOFRAN) 8 MG tablet One pill every 8 hours as needed for nausea/vomitting. 03/22/19  Yes Cammie Sickle, MD  rivaroxaban (XARELTO) 20 MG TABS tablet TAKE 1 TABLET BY MOUTH ONCE DAILY WITH SUPPER 11/05/20  Yes Cammie Sickle, MD  tamsulosin (FLOMAX) 0.4 MG CAPS capsule Take 1 capsule by mouth once daily in the morning 12/11/19  Yes Abbie Sons, MD    Physical Exam: Vitals:   11/14/20 1704 11/14/20 1802 11/14/20 1834 11/14/20 2001  BP:  (!) 140/124 99/64   Pulse:   (!) 101   Resp:  (!) 30 (!) 23 (!) 28  Temp:   98.4 F (36.9 C) 100 F (37.8 C)  TempSrc:   Oral   SpO2: 92% 92% 93%   Weight:      Height:        Constitutional: Patient is lethargic and minimally responsive.  Only able to  open his eyes at times but not on command. Vitals:   11/14/20 1704 11/14/20 1802 11/14/20 1834 11/14/20 2001  BP:  (!) 140/124 99/64   Pulse:   (!) 101   Resp:  (!) 30 (!) 23 (!) 28  Temp:   98.4 F (36.9 C) 100 F (37.8 C)  TempSrc:   Oral   SpO2: 92% 92% 93%   Weight:      Height:       Eyes: PERRL, lids and conjunctivae normal ENMT: Mucous membranes are moist. Neck: normal, supple Respiratory: clear to auscultation anteriorly, no wheezing, no crackles.  Difficulty doing posterior auscultation since patient not able to follow commands.  Normal respiratory effort on 2 L.  No accessory muscle use.  Cardiovascular: Regular rate and rhythm, no murmurs / rubs / gallops. No extremity edema. Abdomen: Abdomen appears distended.  No grimacing with palpation.  No guarding rigidity.  Bowel sounds positive.  Musculoskeletal: no clubbing / cyanosis. No joint deformity upper and lower extremities.  Skin: no rashes, lesions, ulcers. No induration Neurologic: Opens eyes at times but not on command.  Not able to follow simple commands such as hand grip or moving extremities.  Noted to have a rhythmic jerking of the left upper extremity at rest that is new. Psychiatric: Patient lethargic and disoriented.     Labs on Admission: I have personally reviewed following labs and imaging studies  CBC: Recent Labs  Lab 11/14/20 1453  WBC 8.6  NEUTROABS 7.6  HGB 14.9  HCT 44.0  MCV 87.5  PLT 824   Basic Metabolic Panel: Recent Labs  Lab 11/14/20 1453  NA 135  K 5.0  CL 101  CO2 21*  GLUCOSE 252*  BUN 31*  CREATININE 2.18*  CALCIUM 9.5   GFR: Estimated Creatinine Clearance: 30.6 mL/min (A) (by C-G formula based on SCr of 2.18 mg/dL (H)). Liver Function Tests: Recent Labs  Lab 11/14/20 1453  AST 1,838*  ALT 1,358*  ALKPHOS 211*  BILITOT 4.5*  PROT 7.8  ALBUMIN 4.1   No results for input(s): LIPASE, AMYLASE in the last 168 hours. No results for input(s): AMMONIA in the last 168  hours. Coagulation Profile: Recent Labs  Lab 11/14/20 1453  INR 3.0*   Cardiac Enzymes: No results for input(s): CKTOTAL, CKMB, CKMBINDEX, TROPONINI in the last 168 hours. BNP (last 3 results) No results for input(s): PROBNP in the last 8760 hours. HbA1C: No results for input(s): HGBA1C in the last 72 hours. CBG: No results for input(s): GLUCAP in the last 168 hours. Lipid Profile: No results for input(s): CHOL, HDL, LDLCALC, TRIG, CHOLHDL, LDLDIRECT in the last 72 hours. Thyroid Function Tests: No results for input(s): TSH, T4TOTAL, FREET4, T3FREE, THYROIDAB in the last 72 hours. Anemia Panel: No results for input(s): VITAMINB12, FOLATE, FERRITIN, TIBC, IRON, RETICCTPCT in the last 72 hours. Urine analysis:    Component Value Date/Time   COLORURINE YELLOW (A) 11/15/2019 1029   APPEARANCEUR CLEAR (A) 11/15/2019 1029   APPEARANCEUR Clear 09/28/2018 0832   LABSPEC 1.016 11/15/2019 1029   PHURINE 5.0 11/15/2019 1029   GLUCOSEU NEGATIVE 11/15/2019 1029   HGBUR NEGATIVE 11/15/2019 1029   BILIRUBINUR NEGATIVE 11/15/2019 1029   BILIRUBINUR Negative 09/28/2018 0832   KETONESUR NEGATIVE 11/15/2019 1029   PROTEINUR NEGATIVE 11/15/2019 1029   NITRITE NEGATIVE 11/15/2019 1029   LEUKOCYTESUR NEGATIVE 11/15/2019 1029    Radiological Exams on Admission: CT ABDOMEN PELVIS WO CONTRAST  Result Date: 11/14/2020 CLINICAL DATA:  Lung cancer with brain metastases, febrile with acute renal failure and sepsis. 69 year old male. EXAM: CT CHEST, ABDOMEN AND PELVIS WITHOUT CONTRAST TECHNIQUE: Multidetector CT imaging of the chest, abdomen and pelvis was performed following the standard protocol without IV contrast. COMPARISON:  August 19, 2020 FINDINGS: CT CHEST FINDINGS Cardiovascular: Heart size accentuated by low lung volumes likely mildly enlarged. Three-vessel coronary artery disease. No pericardial effusion. Aortic caliber is normal. Scattered atherosclerosis. Central pulmonary vasculature  with normal caliber. Limited assessment of cardiovascular structures given lack of intravenous contrast. Mediastinum/Nodes: No mediastinal lymphadenopathy. Calcified lymph nodes in the chest are unchanged about the AP window. Small pre-vascular lymph node on image 22 unchanged at 6 mm. Lungs/Pleura: Interval development of ground-glass and septal thickening in the RIGHT  chest, parenchymal assessment limited by respiratory motion. No lobar level consolidative changes. Airways are patent. No pleural effusion. LEFT hilar distortion in the setting of post treatment changes in the LEFT chest unchanged from previous imaging. Musculoskeletal: See below for full musculoskeletal details. No acute bone finding about the bony thorax. CT ABDOMEN PELVIS FINDINGS Hepatobiliary: Hepatic cysts as before. Septation in the largest with subtle calcification unchanged. Largest measuring approximately 4 cm. Partially intrahepatic gallbladder without pericholecystic stranding. No biliary duct distension. Pancreas: Pancreas without contour abnormality. Subtle stranding may be present about the head of the pancreas versus motion artifact. No gross ductal dilation. Spleen: Spleen normal size and contour. Adrenals/Urinary Tract: Adrenal glands are normal. Renal cortical scarring bilaterally. No signs of frank hydronephrosis. Urinary bladder with smooth contours. Marked prostatomegaly is similar to the prior study with impression upon the bladder base Stomach/Bowel: Stomach is under distended. Mild gastric thickening suggested. Moderate-sized duodenal diverticulum without surrounding stranding. No sign of bowel obstruction or acute bowel process. Colonic diverticulosis. Vascular/Lymphatic: Aortic atheromatous plaque with calcification, no aneurysmal dilation of the abdominal aorta. There is no gastrohepatic or hepatoduodenal ligament lymphadenopathy. No retroperitoneal or mesenteric lymphadenopathy. No pelvic sidewall lymphadenopathy.  Reproductive: Prostatomegaly as before. Other: No ascites.  No free air. Musculoskeletal: No acute bone finding. No destructive bone process. IMPRESSION: 1. Interval development of ground-glass and septal thickening in the RIGHT chest, parenchymal assessment limited by respiratory motion. Findings appear to be primarily related to atelectasis. Developing infection or even early viral or atypical process could also have this appearance. 2. Unchanged appearance of post treatment changes in the LEFT chest. 3. Subtle stranding may be present about the head of the pancreas versus motion artifact. Correlate with pancreatic enzymes. 4. Under distension versus mild gastric thickening. Could be related to gastritis 5. Marked prostatomegaly as before. 6. Three-vessel coronary artery disease. 7. Colonic diverticulosis without evidence of acute bowel process. 8. Hepatic cysts. 9. Aortic atherosclerosis. Aortic Atherosclerosis (ICD10-I70.0). Electronically Signed   By: Zetta Bills M.D.   On: 11/14/2020 17:46   CT Head Wo Contrast  Result Date: 11/14/2020 CLINICAL DATA:  Delirium. Lung cancer with brain metastases. Lethargic and febrile with sepsis. EXAM: CT HEAD WITHOUT CONTRAST TECHNIQUE: Contiguous axial images were obtained from the base of the skull through the vertex without intravenous contrast. COMPARISON:  MRI 06/04/2020. FINDINGS: Brain: Hemorrhagic metastases in the left frontal lobe and anterior left temporal lobe. Comparison across modalities to the prior MRI is difficult, but there is suggestion of interval increase in size of these lesions which may relate to interval internal hemorrhage. There is also suggestion of increased vasogenic edema surrounding the left temporal metastasis. Additional metastases are not well characterized on this noncontrast head CT. Parieto-occipital encephalomalacia and white matter changes are grossly similar cross modalities. Vascular: Calcific atherosclerosis. No hyperdense  vessel identified. Skull: No acute fracture. Sinuses/Orbits: Mild paranasal sinus mucosal thickening. Other: No mastoid effusions. IMPRESSION: 1. Comparison across modalities to the prior MRI is difficult, but there is suggestion of interval increase in size of hemorrhagic metastases in the left frontal lobe and left temporal lobe which may relate to interval internal hemorrhage. There is also suggestion of increased vasogenic edema surrounding the left temporal metastasis. An MRI with contrast could further characterize and also better evaluate for new metastases if clinically indicated. 2. Additional known metastases are poorly evaluated on this noncontrast head CT. 3. Grossly similar parieto-occipital encephalomalacia and white matter changes. Findings discussed with Dr. Jacqualine Code Via telephone at 5:26 PM. Electronically Signed  By: Margaretha Sheffield MD   On: 11/14/2020 17:30   CT Chest Wo Contrast  Result Date: 11/14/2020 CLINICAL DATA:  Lung cancer with brain metastases, febrile with acute renal failure and sepsis. 70 year old male. EXAM: CT CHEST, ABDOMEN AND PELVIS WITHOUT CONTRAST TECHNIQUE: Multidetector CT imaging of the chest, abdomen and pelvis was performed following the standard protocol without IV contrast. COMPARISON:  August 19, 2020 FINDINGS: CT CHEST FINDINGS Cardiovascular: Heart size accentuated by low lung volumes likely mildly enlarged. Three-vessel coronary artery disease. No pericardial effusion. Aortic caliber is normal. Scattered atherosclerosis. Central pulmonary vasculature with normal caliber. Limited assessment of cardiovascular structures given lack of intravenous contrast. Mediastinum/Nodes: No mediastinal lymphadenopathy. Calcified lymph nodes in the chest are unchanged about the AP window. Small pre-vascular lymph node on image 22 unchanged at 6 mm. Lungs/Pleura: Interval development of ground-glass and septal thickening in the RIGHT chest, parenchymal assessment limited by  respiratory motion. No lobar level consolidative changes. Airways are patent. No pleural effusion. LEFT hilar distortion in the setting of post treatment changes in the LEFT chest unchanged from previous imaging. Musculoskeletal: See below for full musculoskeletal details. No acute bone finding about the bony thorax. CT ABDOMEN PELVIS FINDINGS Hepatobiliary: Hepatic cysts as before. Septation in the largest with subtle calcification unchanged. Largest measuring approximately 4 cm. Partially intrahepatic gallbladder without pericholecystic stranding. No biliary duct distension. Pancreas: Pancreas without contour abnormality. Subtle stranding may be present about the head of the pancreas versus motion artifact. No gross ductal dilation. Spleen: Spleen normal size and contour. Adrenals/Urinary Tract: Adrenal glands are normal. Renal cortical scarring bilaterally. No signs of frank hydronephrosis. Urinary bladder with smooth contours. Marked prostatomegaly is similar to the prior study with impression upon the bladder base Stomach/Bowel: Stomach is under distended. Mild gastric thickening suggested. Moderate-sized duodenal diverticulum without surrounding stranding. No sign of bowel obstruction or acute bowel process. Colonic diverticulosis. Vascular/Lymphatic: Aortic atheromatous plaque with calcification, no aneurysmal dilation of the abdominal aorta. There is no gastrohepatic or hepatoduodenal ligament lymphadenopathy. No retroperitoneal or mesenteric lymphadenopathy. No pelvic sidewall lymphadenopathy. Reproductive: Prostatomegaly as before. Other: No ascites.  No free air. Musculoskeletal: No acute bone finding. No destructive bone process. IMPRESSION: 1. Interval development of ground-glass and septal thickening in the RIGHT chest, parenchymal assessment limited by respiratory motion. Findings appear to be primarily related to atelectasis. Developing infection or even early viral or atypical process could also  have this appearance. 2. Unchanged appearance of post treatment changes in the LEFT chest. 3. Subtle stranding may be present about the head of the pancreas versus motion artifact. Correlate with pancreatic enzymes. 4. Under distension versus mild gastric thickening. Could be related to gastritis 5. Marked prostatomegaly as before. 6. Three-vessel coronary artery disease. 7. Colonic diverticulosis without evidence of acute bowel process. 8. Hepatic cysts. 9. Aortic atherosclerosis. Aortic Atherosclerosis (ICD10-I70.0). Electronically Signed   By: Zetta Bills M.D.   On: 11/14/2020 17:46   US Abdomen Limited RUQ (LIVER/GB)  Result Date: 11/14/2020 CLINICAL DATA:  Right upper quadrant pain and elevated LFTs EXAM: ULTRASOUND ABDOMEN LIMITED RIGHT UPPER QUADRANT COMPARISON:  CT from earlier in the same day. FINDINGS: Gallbladder: Gallbladder is well distended without wall thickening. Multiple folds are noted similar to that seen on prior CT. No cholelithiasis is noted. Common bile duct: Diameter: 3.4 mm. Liver: Hepatic cysts are noted similar to that seen on prior CT examination. Some septation is noted similar to that noted on prior exam. No other focal abnormality is noted. Portal vein is  patent on color Doppler imaging with normal direction of blood flow towards the liver. Other: None. IMPRESSION: Paddock cysts similar to that seen on prior CT examination. No mass lesion is seen. Multiple folds within the gallbladder without complicating factors. Electronically Signed   By: Inez Catalina M.D.   On: 11/14/2020 20:02      Assessment/Plan  Acute metabolic encephalopathy Multifactorial from worsening hemorrhagic metastasis and increased vasogenic edema as well as sepsis from questionable pneumonia versus intra-abdominal infection Neurosurgery has been consulted.  Recommend Keppra for seizure prophylaxis. Continue Decadron for vasoedema Obtain MRI with and without contrast Frequent neurochecks q4hrs  Hx  of stage IV adenocarcinoma of the lung with brain metastasis Worsening Vesogenic edema and possible increase hemorrhage around metastasis Management as above Follows with Dr. Rogue Bussing Consider oncology and palliative consult in the morning- had extensive discussion with wife and daughter regarding his CODE STATUS.  He remains full code.  Severe sepsis secondary to questionable pneumonia/probable hepatitis Patient presented with fever, tachycardia and elevated lactate of greater than 4 CT chest showing groundglass appearance of the right lung which could be atelectasis versus infection-wife denies any respiratory symptoms but will go ahead and cover empirically with vancomycin and cefepime given that patient is ill-appearing Also has significantly elevated transaminitis, increased INR-concerning for acute hepatitis. Obtain acute hepatitis panel Obtain right upper quadrant ultrasound Continue to trend lactate  Has received 2.5 L of LR in the ED.  We will continue 100 cc continuous normal saline fluid.  Acute hypoxic respiratory failure Admitted on 2 L.  Secondary to probable pneumonia Continue vancomycin and cefepime Obtain Legionella and strep pneumo urinary antigen  Transaminitis/supratherapeutic INR Continue management as above Follow with repeat CMP in the morning  Type 2 diabetes Hemoglobin A1c of 10.5 on 2/24 Placed on moderate sliding scale, can uptitrate depending on hyperglycemia while on steroids  History of DVT/PE Hold Xarelto given possible worsening hemorrhagic metastasis of the brain  Acute on CKD stage III Creatinine of 2.18 from prior 1.6 Follow with repeat labs in the morning  DVT prophylaxis:.SCD Code Status: Full-extensive discussion with wife at bedside and daughter over the phone.  Daughter expressed that patient had previously wanted to be full code. Family Communication: Plan discussed with patient at bedside  disposition Plan: Home with at least 2  midnight stays  Consults called:  Admission status: inpatient  Level of care: Stepdown  Status is: Inpatient  Remains inpatient appropriate because:Inpatient level of care appropriate due to severity of illness   Dispo: The patient is from: Home              Anticipated d/c is to: Home              Patient currently is not medically stable to d/c.   Difficult to place patient No         Orene Desanctis DO [facility]   If 7PM-7AM, please contact night-coverage [email or web address]   11/14/2020, 8:05 PM

## 2020-11-14 NOTE — ED Notes (Signed)
Pt to MRI

## 2020-11-14 NOTE — Consult Note (Signed)
PHARMACY -  BRIEF ANTIBIOTIC NOTE   Pharmacy has received consult(s) for vancomycin and cefepime from an ED provider. The patient's profile has been reviewed for ht/wt/allergies/indication/available labs.    One time order(s) placed for  --Vancomycin 1 g IV (already ordered) --Cefepime 2 g IV (already ordered)  Further antibiotics/pharmacy consults should be ordered by admitting physician if indicated.                       Thank you, Benita Gutter 11/14/2020  3:22 PM

## 2020-11-14 NOTE — ED Notes (Signed)
Pt presents to ED via EMS with c/o of AMS and generalized weakness. Per family pt is usually able to get up and go to the bathroom by himself but was not able to get up out of bed to urinate. Pt is borderline febrile with a oral temp of 100.4. Pt denies N/V/Melvin Willis. Pt denies chest pain or SOB. Pt denies recently feeling sick per EMS states pt is currently on palliative care.

## 2020-11-14 NOTE — Consult Note (Signed)
Consult received.  Patient with possible worsening of intracranial disease.  He is presenting with sepsis currently.  Awake and alert per ER.  CT reviewed - possible intratumoral hemorrhage with vasogenic edema around known metastases  Plan - admit for sepsis to medicine. Stable for stepdown from my perspective - hold anticoagulation - keppra 500 mg BID if medically ok - MRI brain with and without - pending final radiographic info, may need to discuss utility of further oncologic treatment versus palliative care  Meade Maw

## 2020-11-14 NOTE — Progress Notes (Signed)
Notified bedside nurse of need to draw repeat lactic acid and pt needs 2274 cc fluid.

## 2020-11-14 NOTE — Progress Notes (Signed)
Following for code sepsis 

## 2020-11-14 NOTE — Consult Note (Signed)
CODE SEPSIS - PHARMACY COMMUNICATION  **Broad Spectrum Antibiotics should be administered within 1 hour of Sepsis diagnosis**  Time Code Sepsis Called/Page Received: 1521  Antibiotics Ordered: 1521  Time of 1st antibiotic administration: 2904  Additional action taken by pharmacy: N/A  If necessary, Name of Provider/Nurse Contacted: N/A    Darnelle Bos ,PharmD Clinical Pharmacist  11/14/2020  3:32 PM

## 2020-11-15 ENCOUNTER — Encounter: Payer: Self-pay | Admitting: Internal Medicine

## 2020-11-15 DIAGNOSIS — R4182 Altered mental status, unspecified: Secondary | ICD-10-CM | POA: Diagnosis not present

## 2020-11-15 DIAGNOSIS — A419 Sepsis, unspecified organism: Secondary | ICD-10-CM | POA: Diagnosis not present

## 2020-11-15 DIAGNOSIS — C3412 Malignant neoplasm of upper lobe, left bronchus or lung: Secondary | ICD-10-CM | POA: Diagnosis not present

## 2020-11-15 DIAGNOSIS — R7881 Bacteremia: Secondary | ICD-10-CM

## 2020-11-15 DIAGNOSIS — G9341 Metabolic encephalopathy: Secondary | ICD-10-CM | POA: Diagnosis not present

## 2020-11-15 DIAGNOSIS — Z86718 Personal history of other venous thrombosis and embolism: Secondary | ICD-10-CM

## 2020-11-15 DIAGNOSIS — Z7901 Long term (current) use of anticoagulants: Secondary | ICD-10-CM

## 2020-11-15 DIAGNOSIS — J189 Pneumonia, unspecified organism: Secondary | ICD-10-CM

## 2020-11-15 DIAGNOSIS — C7931 Secondary malignant neoplasm of brain: Secondary | ICD-10-CM | POA: Diagnosis not present

## 2020-11-15 DIAGNOSIS — N179 Acute kidney failure, unspecified: Secondary | ICD-10-CM

## 2020-11-15 DIAGNOSIS — N183 Chronic kidney disease, stage 3 unspecified: Secondary | ICD-10-CM

## 2020-11-15 LAB — CBG MONITORING, ED
Glucose-Capillary: 307 mg/dL — ABNORMAL HIGH (ref 70–99)
Glucose-Capillary: 232 mg/dL — ABNORMAL HIGH (ref 70–99)

## 2020-11-15 LAB — COMPREHENSIVE METABOLIC PANEL
ALT: 899 U/L — ABNORMAL HIGH (ref 0–44)
AST: 629 U/L — ABNORMAL HIGH (ref 15–41)
Albumin: 3.7 g/dL (ref 3.5–5.0)
Alkaline Phosphatase: 175 U/L — ABNORMAL HIGH (ref 38–126)
Anion gap: 11 (ref 5–15)
BUN: 29 mg/dL — ABNORMAL HIGH (ref 8–23)
CO2: 20 mmol/L — ABNORMAL LOW (ref 22–32)
Calcium: 9.1 mg/dL (ref 8.9–10.3)
Chloride: 106 mmol/L (ref 98–111)
Creatinine, Ser: 1.77 mg/dL — ABNORMAL HIGH (ref 0.61–1.24)
GFR, Estimated: 42 mL/min — ABNORMAL LOW (ref >60)
Glucose, Bld: 285 mg/dL — ABNORMAL HIGH (ref 70–99)
Potassium: 4.3 mmol/L (ref 3.5–5.1)
Sodium: 137 mmol/L (ref 135–145)
Total Bilirubin: 5.7 mg/dL — ABNORMAL HIGH (ref 0.3–1.2)
Total Protein: 7.4 g/dL (ref 6.5–8.1)

## 2020-11-15 LAB — URINALYSIS, COMPLETE (UACMP) WITH MICROSCOPIC
Bilirubin Urine: NEGATIVE
Glucose, UA: 150 mg/dL — AB
Ketones, ur: NEGATIVE mg/dL
Leukocytes,Ua: NEGATIVE
Nitrite: NEGATIVE
Protein, ur: NEGATIVE mg/dL
Specific Gravity, Urine: 1.013 (ref 1.005–1.030)
pH: 5 (ref 5.0–8.0)

## 2020-11-15 LAB — CBC
HCT: 42.9 % (ref 39.0–52.0)
Hemoglobin: 14.4 g/dL (ref 13.0–17.0)
MCH: 29.6 pg (ref 26.0–34.0)
MCHC: 33.6 g/dL (ref 30.0–36.0)
MCV: 88.3 fL (ref 80.0–100.0)
Platelets: 184 10*3/uL (ref 150–400)
RBC: 4.86 MIL/uL (ref 4.22–5.81)
RDW: 14.6 % (ref 11.5–15.5)
WBC: 9.8 10*3/uL (ref 4.0–10.5)
nRBC: 0 % (ref 0.0–0.2)

## 2020-11-15 LAB — BLOOD CULTURE ID PANEL (REFLEXED) - BCID2

## 2020-11-15 LAB — STREP PNEUMONIAE URINARY ANTIGEN: Strep Pneumo Urinary Antigen: NEGATIVE

## 2020-11-15 LAB — LACTIC ACID, PLASMA: Lactic Acid, Venous: 3.3 mmol/L (ref 0.5–1.9)

## 2020-11-15 LAB — HEPATITIS PANEL, ACUTE
HCV Ab: NONREACTIVE
Hep A IgM: NONREACTIVE
Hep B C IgM: NONREACTIVE
Hepatitis B Surface Ag: NONREACTIVE

## 2020-11-15 LAB — GLUCOSE, CAPILLARY
Glucose-Capillary: 229 mg/dL — ABNORMAL HIGH (ref 70–99)
Glucose-Capillary: 277 mg/dL — ABNORMAL HIGH (ref 70–99)

## 2020-11-15 MED ORDER — DEXAMETHASONE SODIUM PHOSPHATE 4 MG/ML IJ SOLN: 4.0000 mg | Freq: Four times a day (QID) | INTRAMUSCULAR | Status: DC

## 2020-11-15 MED ORDER — DEXAMETHASONE SODIUM PHOSPHATE 4 MG/ML IJ SOLN
4.0000 mg | Freq: Four times a day (QID) | INTRAMUSCULAR | Status: AC
  Administered 2020-11-15 – 2020-11-17 (×9): 4 mg via INTRAVENOUS
  Filled 2020-11-15 (×9): qty 1

## 2020-11-15 MED ORDER — SODIUM CHLORIDE 0.9 % IV SOLN
2.0000 g | INTRAVENOUS | Status: DC
  Administered 2020-11-15 – 2020-11-18 (×4): 2 g via INTRAVENOUS
  Filled 2020-11-15: qty 2
  Filled 2020-11-15 (×4): qty 20
  Filled 2020-11-15: qty 2

## 2020-11-15 NOTE — ED Notes (Signed)
Pt observed sitting up on foot of bed and had disconnected monitor. Pt redirected back to bed, assisted to ambulate around bed and get into a position of comfort. Pt instructed to call staff for assistance before getting up. Pt verbalized understanding. Monitor reapplied.

## 2020-11-15 NOTE — Progress Notes (Signed)
[employer] [facility] reported from nurse all 4 bottles + enterbacterales - klebsiella pneumoniae. Discussed with pharmacist. No active orders for antibiotics Receive cefepime vanc and metronidazole previously Started on rocephin 2 gm IV every 12 hours

## 2020-11-15 NOTE — ED Notes (Signed)
Brief and linens changed for urine incontinence.

## 2020-11-15 NOTE — Consult Note (Signed)
[place] CONSULT NOTE  Patient Care Team: [name], [name], [name], [name], [facility] (Pharmacist)  CHIEF COMPLAINTS/PURPOSE OF CONSULTATION: Lung cancer/brain mets  HISTORY OF PRESENTING ILLNESS:  Melvin Willis 70 y.o.  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 Patient was lethargic in the morning; and was disoriented.  Of note patient's lactic acid was elevated 5.3 and also elevated creatinine of 2.11 baseline creatinine of 1.5.  AST ALT elevated about thousand.  Total bilirubin of 4.5.  INR 3.    Patient had a CT scan that showed hemorrhagic metastasis with increased vasogenic edema.  Also MRI brain showed progressive brain metastases.  CT scan chest and pelvis shows groundglass opacities right chest atelectasis versus early infection.  Otherwise no significant findings of the gallbladder the liver.  Patient has been evaluated by neurosurgery-started on IV steroids/Keppra. Holding of anticoagulation.  No surgical intervention needed.  Also patient blood culture positive.   Review of Systems  Unable to perform ROS: Mental acuity     MEDICAL HISTORY:  Past Medical History:  Diagnosis Date   Abnormal prostate specific antigen 08/08/2012   Adiposity 04/16/2015   Chronic kidney disease (CKD), stage III (moderate) ([place]) 11/27/2016   CVA (cerebral vascular accident) ([place]) 06/17/2016   Diabetes mellitus without complication ([place])    Diverticulosis of sigmoid colon 04/16/2015   Dyslipidemia 03/18/2015   Hemorrhoids, internal 04/16/2015   Hypercholesteremia 04/16/2015   Hyperlipidemia    Hypertension    Primary cancer of left upper lobe of lung ([place])     Pulmonary embolism ([place])    Wears dentures    partial upper    SURGICAL HISTORY: Past Surgical History:  Procedure Laterality Date   COLONOSCOPY     COLONOSCOPY WITH PROPOFOL N/A 05/06/2015   Procedure: COLONOSCOPY WITH PROPOFOL;  Surgeon: Lucilla Lame, MD;  Location: [place];  Service: Endoscopy;  Laterality: N/A;  ASCENDING COLON POLYPS X 2 TERMINAL ILEUM BIOPSY RANDOM COLON BX. TRANSVERSE COLON POLYP SIGMOID COLON POLYP   ESOPHAGOGASTRODUODENOSCOPY (EGD) WITH PROPOFOL N/A 05/06/2015   Procedure: ESOPHAGOGASTRODUODENOSCOPY (EGD) WITH PROPOFOL;  Surgeon: Lucilla Lame, MD;  Location: [facility];  Service: Endoscopy;  Laterality: N/A;  GASTRIC BIOPSY X1   KIDNEY STONE SURGERY  2017    SOCIAL HISTORY: Social History   Socioeconomic History   Marital status: Married    Spouse name: Melissa    Number of children: 3   Years of education: Not on file   Highest education level: Not on file  Occupational History   Occupation: disable     Comment: from CVA and lung cancer  Tobacco Use   Smoking status: Never Smoker   Smokeless tobacco: Never Used  Scientific laboratory technician Use: Never used  Substance and Sexual Activity   Alcohol use: No    Alcohol/week: 0.0 standard drinks   Drug use: No   Sexual activity: Yes    Partners: Female  Other Topics Concern   Not on file  Social History Narrative   Used to work until 2 years ago when got sick with DVT leg , PE , CVA and found out he had lung cancer. He has medicare now    Social Determinants  of Health   Financial Resource Strain: High Risk   Difficulty of Paying Living Expenses: Very hard  Food Insecurity: No Food Insecurity   Worried About Running Out of Food in the Last Year: Never true   Ran Out of Food in the Last Year: Never true  Transportation Needs: No Transportation Needs   Lack of Transportation (Medical): No   Lack of Transportation (Non-Medical): No  Physical Activity: Inactive    Days of Exercise per Week: 0 days   Minutes of Exercise per Session: 0 min  Stress: No Stress Concern Present   Feeling of Stress : Not at all  Social Connections: Moderately Integrated   Frequency of Communication with Friends and Family: More than three times a week   Frequency of Social Gatherings with Friends and Family: Three times a week   Attends Religious Services: More than 4 times per year   Active Member of Clubs or Organizations: No   Attends Archivist Meetings: Never   Marital Status: Married  Human resources officer Violence: Not At Risk   Fear of Current or Ex-Partner: No   Emotionally Abused: No   Physically Abused: No   Sexually Abused: No    FAMILY HISTORY: Family History  Problem Relation Age of Onset   Diabetes Mother    Diabetes Father    CAD Father    Dementia Father    Diabetes Sister    Cancer Maternal Uncle        Prostate   Cancer Cousin        prostate    ALLERGIES:  has No Known Allergies.  MEDICATIONS:  Current Facility-Administered Medications  Medication Dose Route Frequency Provider Last Rate Last Admin   0.9 %  sodium chloride infusion   Intravenous Continuous Fritzi Mandes, MD 75 mL/hr at 11/15/20 1716 Rate Change at 11/15/20 1716   cefTRIAXone (ROCEPHIN) 2 g in sodium chloride 0.9 % 100 mL IVPB  2 g Intravenous Q24H Sharion Settler, NP   Stopped at 11/15/20 0540   dexamethasone (DECADRON) injection 4 mg  4 mg Intravenous Q6H Fritzi Mandes, MD   4 mg at 11/15/20 1527   gabapentin (NEURONTIN) capsule 300 mg  300 mg Oral QHS Tu, Ching T, DO   300 mg at 11/15/20 2143   insulin aspart (novoLOG) injection 0-15 Units  0-15 Units Subcutaneous TID WC Tu, Ching T, DO   5 Units at 11/15/20 1721   insulin aspart (novoLOG) injection 0-5 Units  0-5 Units Subcutaneous QHS Tu, Ching T, DO   3 Units at 11/15/20 2143   iohexol (OMNIPAQUE) 350 MG/ML injection 60 mL  60 mL Intravenous Once PRN Delman Kitten, MD        levETIRAcetam (KEPPRA) IVPB 500 mg/100 mL premix  500 mg Intravenous Q12H Tu, Ching T, DO 400 mL/hr at 11/15/20 2018 500 mg at 11/15/20 2018      .  PHYSICAL EXAMINATION:  Vitals:   11/15/20 1456 11/15/20 1955  BP: 114/80 132/76  Pulse: 64 66  Resp: 16 16  Temp:  98 F (36.7 C)  SpO2: 95% 93%   Filed Weights   11/14/20 1538  Weight: 167 lb 1.7 oz (75.8 kg)    Physical Exam Constitutional:      Comments: Patient resting in the stretcher no acute distress.  HENT:     Head: Normocephalic and atraumatic.     Mouth/Throat:     Pharynx: No oropharyngeal exudate.  Eyes:     Pupils: Pupils are equal,  round, and reactive to light.  Cardiovascular:     Rate and Rhythm: Normal rate and regular rhythm.  Pulmonary:     Effort: No respiratory distress.     Breath sounds: No wheezing.     Comments: Clear to auscultation bilaterally. Abdominal:     General: Bowel sounds are normal. There is no distension.     Palpations: Abdomen is soft. There is no mass.     Tenderness: There is no abdominal tenderness. There is no guarding or rebound.  Musculoskeletal:        General: No tenderness. Normal range of motion.     Cervical back: Normal range of motion and neck supple.  Skin:    General: Skin is warm.  Neurological:     Mental Status: He is alert.     Comments: Oriented x1-2  Psychiatric:        Mood and Affect: Affect normal.      LABORATORY DATA:  I have reviewed the data as listed Lab Results  Component Value Date   WBC 9.8 11/15/2020   HGB 14.4 11/15/2020   HCT 42.9 11/15/2020   MCV 88.3 11/15/2020   PLT 184 11/15/2020   Recent Labs    05/06/20 1300 06/03/20 0951 07/01/20 0920 07/26/20 1014 07/29/20 0901 10/07/20 0953 11/14/20 1453 11/15/20 0420  NA 138 141   < > 138   < > 134* 135 137  K 4.4 4.1   < > 4.8   < > 4.3 5.0 4.3  CL 106 109   < > 104   < > 100 101 106  CO2 23 22   < > 26   < > 23 21* 20*  GLUCOSE 283* 187*   < > 203*   < > 184* 252* 285*   BUN 23 24*   < > 18   < > 23 31* 29*  CREATININE 1.62* 1.79*   < > 1.58*   < > 1.60* 2.18* 1.77*  CALCIUM 9.2 9.0   < > 10.0   < > 9.2 9.5 9.1  GFRNONAA 44* 39*   < > 45*   < > 47* 32* 42*  GFRAA 51* 45*  --  52*  --   --   --   --   PROT 7.6 7.4   < > 7.5   < > 7.5 7.8 7.4  ALBUMIN 4.3 4.1   < >  --    < > 4.2 4.1 3.7  AST 24 21   < > 18   < > 22 1,838* 629*  ALT 28 27   < > 28   < > 30 1,358* 899*  ALKPHOS 50 48   < >  --    < > 52 211* 175*  BILITOT 0.7 0.7   < > 0.4   < > 0.6 4.5* 5.7*   < > = values in this interval not displayed.    RADIOGRAPHIC STUDIES: I have personally reviewed the radiological images as listed and agreed with the findings in the report. CT ABDOMEN PELVIS WO CONTRAST  Result Date: 11/14/2020 CLINICAL DATA:  Lung cancer with brain metastases, febrile with acute renal failure and sepsis. 70 year old male. EXAM: CT CHEST, ABDOMEN AND PELVIS WITHOUT CONTRAST TECHNIQUE: Multidetector CT imaging of the chest, abdomen and pelvis was performed following the standard protocol without IV contrast. COMPARISON:  August 19, 2020 FINDINGS: CT CHEST FINDINGS Cardiovascular: Heart size accentuated by low lung volumes likely mildly enlarged. Three-vessel coronary  artery disease. No pericardial effusion. Aortic caliber is normal. Scattered atherosclerosis. Central pulmonary vasculature with normal caliber. Limited assessment of cardiovascular structures given lack of intravenous contrast. Mediastinum/Nodes: No mediastinal lymphadenopathy. Calcified lymph nodes in the chest are unchanged about the AP window. Small pre-vascular lymph node on image 22 unchanged at 6 mm. Lungs/Pleura: Interval development of ground-glass and septal thickening in the RIGHT chest, parenchymal assessment limited by respiratory motion. No lobar level consolidative changes. Airways are patent. No pleural effusion. LEFT hilar distortion in the setting of post treatment changes in the LEFT chest unchanged from  previous imaging. Musculoskeletal: See below for full musculoskeletal details. No acute bone finding about the bony thorax. CT ABDOMEN PELVIS FINDINGS Hepatobiliary: Hepatic cysts as before. Septation in the largest with subtle calcification unchanged. Largest measuring approximately 4 cm. Partially intrahepatic gallbladder without pericholecystic stranding. No biliary duct distension. Pancreas: Pancreas without contour abnormality. Subtle stranding may be present about the head of the pancreas versus motion artifact. No gross ductal dilation. Spleen: Spleen normal size and contour. Adrenals/Urinary Tract: Adrenal glands are normal. Renal cortical scarring bilaterally. No signs of frank hydronephrosis. Urinary bladder with smooth contours. Marked prostatomegaly is similar to the prior study with impression upon the bladder base Stomach/Bowel: Stomach is under distended. Mild gastric thickening suggested. Moderate-sized duodenal diverticulum without surrounding stranding. No sign of bowel obstruction or acute bowel process. Colonic diverticulosis. Vascular/Lymphatic: Aortic atheromatous plaque with calcification, no aneurysmal dilation of the abdominal aorta. There is no gastrohepatic or hepatoduodenal ligament lymphadenopathy. No retroperitoneal or mesenteric lymphadenopathy. No pelvic sidewall lymphadenopathy. Reproductive: Prostatomegaly as before. Other: No ascites.  No free air. Musculoskeletal: No acute bone finding. No destructive bone process. IMPRESSION: 1. Interval development of ground-glass and septal thickening in the RIGHT chest, parenchymal assessment limited by respiratory motion. Findings appear to be primarily related to atelectasis. Developing infection or even early viral or atypical process could also have this appearance. 2. Unchanged appearance of post treatment changes in the LEFT chest. 3. Subtle stranding may be present about the head of the pancreas versus motion artifact. Correlate with  pancreatic enzymes. 4. Under distension versus mild gastric thickening. Could be related to gastritis 5. Marked prostatomegaly as before. 6. Three-vessel coronary artery disease. 7. Colonic diverticulosis without evidence of acute bowel process. 8. Hepatic cysts. 9. Aortic atherosclerosis. Aortic Atherosclerosis (ICD10-I70.0). Electronically Signed   By: Zetta Bills M.D.   On: 11/14/2020 17:46   CT Head Wo Contrast  Result Date: 11/14/2020 CLINICAL DATA:  Delirium. Lung cancer with brain metastases. Lethargic and febrile with sepsis. EXAM: CT HEAD WITHOUT CONTRAST TECHNIQUE: Contiguous axial images were obtained from the base of the skull through the vertex without intravenous contrast. COMPARISON:  MRI 06/04/2020. FINDINGS: Brain: Hemorrhagic metastases in the left frontal lobe and anterior left temporal lobe. Comparison across modalities to the prior MRI is difficult, but there is suggestion of interval increase in size of these lesions which may relate to interval internal hemorrhage. There is also suggestion of increased vasogenic edema surrounding the left temporal metastasis. Additional metastases are not well characterized on this noncontrast head CT. Parieto-occipital encephalomalacia and white matter changes are grossly similar cross modalities. Vascular: Calcific atherosclerosis. No hyperdense vessel identified. Skull: No acute fracture. Sinuses/Orbits: Mild paranasal sinus mucosal thickening. Other: No mastoid effusions. IMPRESSION: 1. Comparison across modalities to the prior MRI is difficult, but there is suggestion of interval increase in size of hemorrhagic metastases in the left frontal lobe and left temporal lobe which may relate to interval  internal hemorrhage. There is also suggestion of increased vasogenic edema surrounding the left temporal metastasis. An MRI with contrast could further characterize and also better evaluate for new metastases if clinically indicated. 2. Additional known  metastases are poorly evaluated on this noncontrast head CT. 3. Grossly similar parieto-occipital encephalomalacia and white matter changes. Findings discussed with Dr. Jacqualine Code Via telephone at 5:26 PM. Electronically Signed   By: Margaretha Sheffield MD   On: 11/14/2020 17:30   CT Chest Wo Contrast  Result Date: 11/14/2020 CLINICAL DATA:  Lung cancer with brain metastases, febrile with acute renal failure and sepsis. 70 year old male. EXAM: CT CHEST, ABDOMEN AND PELVIS WITHOUT CONTRAST TECHNIQUE: Multidetector CT imaging of the chest, abdomen and pelvis was performed following the standard protocol without IV contrast. COMPARISON:  August 19, 2020 FINDINGS: CT CHEST FINDINGS Cardiovascular: Heart size accentuated by low lung volumes likely mildly enlarged. Three-vessel coronary artery disease. No pericardial effusion. Aortic caliber is normal. Scattered atherosclerosis. Central pulmonary vasculature with normal caliber. Limited assessment of cardiovascular structures given lack of intravenous contrast. Mediastinum/Nodes: No mediastinal lymphadenopathy. Calcified lymph nodes in the chest are unchanged about the AP window. Small pre-vascular lymph node on image 22 unchanged at 6 mm. Lungs/Pleura: Interval development of ground-glass and septal thickening in the RIGHT chest, parenchymal assessment limited by respiratory motion. No lobar level consolidative changes. Airways are patent. No pleural effusion. LEFT hilar distortion in the setting of post treatment changes in the LEFT chest unchanged from previous imaging. Musculoskeletal: See below for full musculoskeletal details. No acute bone finding about the bony thorax. CT ABDOMEN PELVIS FINDINGS Hepatobiliary: Hepatic cysts as before. Septation in the largest with subtle calcification unchanged. Largest measuring approximately 4 cm. Partially intrahepatic gallbladder without pericholecystic stranding. No biliary duct distension. Pancreas: Pancreas without contour  abnormality. Subtle stranding may be present about the head of the pancreas versus motion artifact. No gross ductal dilation. Spleen: Spleen normal size and contour. Adrenals/Urinary Tract: Adrenal glands are normal. Renal cortical scarring bilaterally. No signs of frank hydronephrosis. Urinary bladder with smooth contours. Marked prostatomegaly is similar to the prior study with impression upon the bladder base Stomach/Bowel: Stomach is under distended. Mild gastric thickening suggested. Moderate-sized duodenal diverticulum without surrounding stranding. No sign of bowel obstruction or acute bowel process. Colonic diverticulosis. Vascular/Lymphatic: Aortic atheromatous plaque with calcification, no aneurysmal dilation of the abdominal aorta. There is no gastrohepatic or hepatoduodenal ligament lymphadenopathy. No retroperitoneal or mesenteric lymphadenopathy. No pelvic sidewall lymphadenopathy. Reproductive: Prostatomegaly as before. Other: No ascites.  No free air. Musculoskeletal: No acute bone finding. No destructive bone process. IMPRESSION: 1. Interval development of ground-glass and septal thickening in the RIGHT chest, parenchymal assessment limited by respiratory motion. Findings appear to be primarily related to atelectasis. Developing infection or even early viral or atypical process could also have this appearance. 2. Unchanged appearance of post treatment changes in the LEFT chest. 3. Subtle stranding may be present about the head of the pancreas versus motion artifact. Correlate with pancreatic enzymes. 4. Under distension versus mild gastric thickening. Could be related to gastritis 5. Marked prostatomegaly as before. 6. Three-vessel coronary artery disease. 7. Colonic diverticulosis without evidence of acute bowel process. 8. Hepatic cysts. 9. Aortic atherosclerosis. Aortic Atherosclerosis (ICD10-I70.0). Electronically Signed   By: Zetta Bills M.D.   On: 11/14/2020 17:46   MR BRAIN W WO  CONTRAST  Result Date: 11/15/2020 CLINICAL DATA:  Encephalopathy EXAM: MRI HEAD WITHOUT AND WITH CONTRAST TECHNIQUE: Multiplanar, multiecho pulse sequences of the brain and surrounding  structures were obtained without and with intravenous contrast. CONTRAST:  7.10m GADAVIST GADOBUTROL 1 MMOL/ML IV SOLN COMPARISON:  06/04/2020 FINDINGS: Brain: No acute infarct, mass effect or extra-axial collection. Multiple sites of chronic hemorrhage, greatest in the left frontal lobe and right parietal lobe. Large amount of abnormal T2-weighted signal within the white matter. There are old bilateral cerebellar infarcts. Contrast enhancing lesions are as follows: 1. Left temporal lobe 1.9 cm, previously 1.2 cm, series 21, image 11. Marked worsening of edema surrounding this lesion. 2. Left frontal lobe, 1.0 cm, previously 0.8 cm, image 15 3. Punctate midbrain lesion, unchanged, image 13 There are no new lesions identified. Vascular: Major flow voids are preserved. Skull and upper cervical spine: Normal calvarium and skull base. Visualized upper cervical spine and soft tissues are normal. Sinuses/Orbits:Right mastoid effusion. Nasopharynx is clear. Left maxillary retention cyst. Normal orbits. IMPRESSION: 1. Increased size of left temporal lobe metastatic lesion with marked worsening of surrounding edema. 2. Increased size of left frontal metastasis. 3. Unchanged appearance of punctate midbrain lesion. No new lesions. 4. Old bilateral cerebellar infarcts and findings of chronic microvascular disease. Electronically Signed   By: KUlyses JarredM.D.   On: 11/15/2020 00:00   UKoreaAbdomen Limited RUQ (LIVER/GB)  Result Date: 11/14/2020 CLINICAL DATA:  Right upper quadrant pain and elevated LFTs EXAM: ULTRASOUND ABDOMEN LIMITED RIGHT UPPER QUADRANT COMPARISON:  CT from earlier in the same day. FINDINGS: Gallbladder: Gallbladder is well distended without wall thickening. Multiple folds are noted similar to that seen on prior CT. No  cholelithiasis is noted. Common bile duct: Diameter: 3.4 mm. Liver: Hepatic cysts are noted similar to that seen on prior CT examination. Some septation is noted similar to that noted on prior exam. No other focal abnormality is noted. Portal vein is patent on color Doppler imaging with normal direction of blood flow towards the liver. Other: None. IMPRESSION: [place] cysts similar to that seen on prior CT examination. No mass lesion is seen. Multiple folds within the gallbladder without complicating factors. Electronically Signed   By: MInez CatalinaM.D.   On: 11/14/2020 20:02    Primary cancer of left upper lobe of lung ([facility] #70year old male patient with a history of metastatic lung cancer to brain is currently admitted to hospital for mental status changes  #Mental status changes-likely secondary to progressive brain metastases; CT/MRI concerning for progressive disease with edema.    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  CT scan overall stable disease.  HOLD entrectinib- given the elevated LFTs.   #Elevated LFTs-unclear etiology noted for obstruction noted. ? Entretinib vs others hypotension etc. no obvious evidence of metastatic disease noted.  Monitor closely.  #Positive blood culture-on broad-spectrum antibiotics; appreciate ID recommendations.  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  Hold Xarelto given the brain hemorrhagic transformation.   # acute renal failure on CKD stage III-creatinine 2.1- likely pre-renal. Monitor closely on IVFs.   #Recommendations:  # Agree with IV dexamethasone 4 mg every 6 x1 day and  with clinical improvement taper for every 8 x 2 days and then oral dexamethasone based continued clinical improvement.  Plan radiation; discussed with Dr. CDonella Stade  Appreciate recommendations from Dr. YCari Caraway neurosurgery.   #Discussed with patient's wife Melvin Willis-overall guarded  prognosis from progressive metastatic disease in the brain.  Discussed that we will need to switch over to alternative TKI likely lorlatinib as the next line of therapy.   #  Also discussed with the patient's wife given the guarded prognosis we will need to review patient's CODE STATUS.  Would recommend palliative care evaluation on 3/14. Will discuss with [employer].   Thank you Dr.Patel for allowing me to participate in the care of your pleasant patient. Please do not hesitate to contact me with questions or concerns in the interim.  Dr. Grayland Ormond on call over the weekend. Please feel to reach for any questions or concerns.   All questions were answered. The patient knows to call the clinic with any problems, questions or concerns.   Cammie Sickle, MD 11/15/2020 10:45 PM

## 2020-11-15 NOTE — Consult Note (Signed)
 Referring Physician:  No referring provider defined for this encounter.  Primary Physician:  Sowles, Krichna, MD  Chief Complaint:  Change in CT findings  History of Present Illness: 11/15/2020 Melvin Willis is a 70 y.o. male who presents with the chief complaint of altered mental status.  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  He presented with lethargy and altered mental status yesterday, and was brought to the ER.  He had N/V the previous day.  During ER workup, bacteremia and sepsis were suspected and confirmed.  Today, he denies HA/N/V or changes in mental status.   Review of Systems:  A 10 point review of systems is negative, except for the pertinent positives and negatives detailed in the HPI.  Past Medical History: Past Medical History:  Diagnosis Date  . Abnormal prostate specific antigen 08/08/2012  . Adiposity 04/16/2015  . Chronic kidney disease (CKD), stage III (moderate) (HCC) 11/27/2016  . CVA (cerebral vascular accident) (HCC) 06/17/2016  . Diabetes mellitus without complication (HCC)   . Diverticulosis of sigmoid colon 04/16/2015  . Dyslipidemia 03/18/2015  . Hemorrhoids, internal 04/16/2015  . Hypercholesteremia 04/16/2015  . Hyperlipidemia   . Hypertension   . Primary cancer of left upper lobe of lung (HCC)   . Pulmonary embolism (HCC)   . Wears dentures    partial upper    Past Surgical History: Past Surgical History:  Procedure Laterality Date  . COLONOSCOPY    . COLONOSCOPY WITH PROPOFOL N/A 05/06/2015   Procedure: COLONOSCOPY WITH PROPOFOL;  Surgeon: Darren Wohl, MD;  Location: [facility];  Service: Endoscopy;  Laterality: N/A;  ASCENDING COLON POLYPS X 2 TERMINAL ILEUM BIOPSY RANDOM COLON BX. TRANSVERSE COLON POLYP SIGMOID COLON POLYP  . ESOPHAGOGASTRODUODENOSCOPY (EGD) WITH PROPOFOL N/A 05/06/2015   Procedure: ESOPHAGOGASTRODUODENOSCOPY (EGD) WITH PROPOFOL;  Surgeon:  Darren Wohl, MD;  Location: [facility];  Service: Endoscopy;  Laterality: N/A;  GASTRIC BIOPSY X1  . KIDNEY STONE SURGERY  2017    Allergies: Allergies as of 11/14/2020  . (No Known Allergies)    Medications:  Current Facility-Administered Medications:  .  0.9 %  sodium chloride infusion, , Intravenous, Continuous, Tu, Ching T, DO, Last Rate: 100 mL/hr at 11/14/20 2032, New Bag at 11/14/20 2032 .  cefTRIAXone (ROCEPHIN) 2 g in sodium chloride 0.9 % 100 mL IVPB, 2 g, Intravenous, Q24H, Morrison, Brenda, NP, Stopped at 11/15/20 0540 .  dexamethasone (DECADRON) injection 10 mg, 10 mg, Intravenous, Q24H, Tu, Ching T, DO .  entrectinib (ROZLYTREK) capsule 600 mg, 600 mg, Oral, Daily, Tu, Ching T, DO .  gabapentin (NEURONTIN) capsule 300 mg, 300 mg, Oral, QHS, Tu, Ching T, DO, 300 mg at 11/14/20 2239 .  insulin aspart (novoLOG) injection 0-15 Units, 0-15 Units, Subcutaneous, TID WC, Tu, Ching T, DO .  insulin aspart (novoLOG) injection 0-5 Units, 0-5 Units, Subcutaneous, QHS, Tu, Ching T, DO, 3 Units at 11/14/20 2239 .  iohexol (OMNIPAQUE) 350 MG/ML injection 60 mL, 60 mL, Intravenous, Once PRN, Quale, Mark, MD .  levETIRAcetam (KEPPRA) IVPB 500 mg/100 mL premix, 500 mg, Intravenous, Q12H, Tu, Ching T, DO  Current Outpatient Medications:  .  atorvastatin (LIPITOR) 40 MG tablet, Take 1 tablet (40 mg total) by mouth daily., Disp: 90 tablet, Rfl: 0 .  blood glucose meter kit and supplies, Dispense based on patient and insurance preference (Accuchek Aviva Plus, Accuchek Nano). Use up to four times daily as directed. (  FOR ICD-10 E10.9, E11.9)., Disp: 1 each, Rfl: 0 .  entrectinib (ROZLYTREK) 200 MG capsule, Take 3 capsules (600 mg total) by mouth daily. (Patient taking differently: Take 600 mg by mouth daily. Pt reports taking 2 pills daily per MD), Disp: 90 capsule, Rfl: 6 .  finasteride (PROSCAR) 5 MG tablet, Take 1 tablet (5 mg total) by mouth daily., Disp: 90 tablet, Rfl: 3 .  gabapentin  (NEURONTIN) 300 MG capsule, Take 1 capsule by mouth at bedtime, Disp: 30 capsule, Rfl: 0 .  Insulin Glargine-Lixisenatide (SOLIQUA) 100-33 UNT-MCG/ML SOPN, Inject 15-30 Units into the skin daily., Disp: 15 mL, Rfl: 1 .  Insulin Pen Needle (NOVOFINE PEN NEEDLE) 32G X 6 MM MISC, 1 each by Does not apply route daily., Disp: 100 each, Rfl: 2 .  metFORMIN (GLUCOPHAGE-XR) 750 MG 24 hr tablet, Take 2 tablets (1,500 mg total) by mouth daily. (Patient taking differently: Take 750 mg by mouth 2 (two) times daily.), Disp: 180 tablet, Rfl: 0 .  ondansetron (ZOFRAN) 8 MG tablet, One pill every 8 hours as needed for nausea/vomitting., Disp: 40 tablet, Rfl: 1 .  rivaroxaban (XARELTO) 20 MG TABS tablet, TAKE 1 TABLET BY MOUTH ONCE DAILY WITH SUPPER, Disp: 30 tablet, Rfl: 6 .  tamsulosin (FLOMAX) 0.4 MG CAPS capsule, Take 1 capsule by mouth once daily in the morning, Disp: 90 capsule, Rfl: 3   Social History: Social History   Tobacco Use  . Smoking status: Never Smoker  . Smokeless tobacco: Never Used  Vaping Use  . Vaping Use: Never used  Substance Use Topics  . Alcohol use: No    Alcohol/week: 0.0 standard drinks  . Drug use: No    Family Medical History: Family History  Problem Relation Age of Onset  . Diabetes Mother   . Diabetes Father   . CAD Father   . Dementia Father   . Diabetes Sister   . Cancer Maternal Uncle        Prostate  . Cancer Cousin        prostate    Physical Examination: Vitals:   11/15/20 0600 11/15/20 0630  BP: 108/80 113/78  Pulse: 73 71  Resp:    Temp:    SpO2: 93% 93%     General: Patient is well developed, well nourished, calm, collected, and in no apparent distress.  Psychiatric: Patient is non-anxious.  Head:  Pupils equal, round, and reactive to light.  ENT:  Oral mucosa appears well hydrated.  Neck:   Supple.  Full range of motion.  Respiratory: Patient is breathing without any difficulty.  Extremities: No edema.  Vascular: Palpable pulses  in dorsal pedal vessels.  Skin:   On exposed skin, there are no abnormal skin lesions.  NEUROLOGICAL:  General: In no acute distress.   Awake, alert, oriented to person, place, and "June 2022".  Pupils equal round and reactive to light.  Facial tone is symmetric.  Tongue protrusion is midline.  There is no pronator drift.  Speech is slowed   Strength: Side Biceps Triceps Deltoid Interossei Grip Wrist Ext. Wrist Flex.  R _0 L _1 Side Iliopsoas Quads Hamstring PF DF EHL  R _2 L _3 Bilateral upper and lower extremity sensation is intact to light touch. Reflexes are 1+ and symmetric at the biceps, triceps, brachioradialis, patella and achilles. Hoffman's is  absent.  Gait is untested.  Imaging: MRI Brain 11/14/20 IMPRESSION: 1. Increased size of left temporal lobe metastatic lesion with marked worsening of surrounding edema. 2. Increased size of left frontal metastasis. 3. Unchanged appearance of punctate midbrain lesion. No new lesions. 4. Old bilateral cerebellar infarcts and findings of chronic microvascular disease.   Electronically Signed   By: Kevin  Herman M.D.   On: 11/15/2020 00:00  I have personally reviewed the images and agree with the above interpretation.  Labs: CBC Latest Ref Rng & Units 11/15/2020 11/14/2020 10/07/2020  WBC 4.0 - 10.5 K/uL 9.8 8.6 5.1  Hemoglobin 13.0 - 17.0 g/dL 14.4 14.9 14.3  Hematocrit 39.0 - 52.0 % 42.9 44.0 41.9  Platelets 150 - 400 K/uL 184 205 189       Assessment and Plan: Mr. Sarin is a pleasant 69 y.o. male with worsening intracranial disease from metastatic lung CA.  He has substantial white matter abnormality which could be the after-effects of XRT or due to progression of disease.  Either way, this represents a treatment failure.  - No surgery for now. Dexamethasone may help manage his symptoms.   - Would reevaluate for SRS if he responds to treatment - Admission to  medicine for treatment of sepsis - Given chronic hemorrhage, therapeutic anticoagulation may be contraindicated.  Would hold for now, and consider restarting in 7 days - May need to discuss palliative care given worsening disease burden. - Would give keppra 500 mg BID     K.  MD, MPHS Dept. of Neurosurgery    

## 2020-11-15 NOTE — Consult Note (Signed)
NAME: Melvin Willis  DOB: Jan 02, 1951  MRN: [id number]  Date/Time: 11/15/2020 9:56 AM  REQUESTING PROVIDER: Dr. Posey Pronto Subjective:  REASON FOR CONSULT: Bacteremia ?chart reviewed- history from wife and some from patient Melvin Willis is a 70 y.o. 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  As per family patient is usually able to get up and go to the bathroom by himself but was not able to get out of the bed today. In the ED vitals temp 100, BP 91/74, heart rate 79, respiratory rate 31, sats 93% on 2 L oxygen. WBC 8.6, Hb 14.9, platelet 205, creatinine 2.28.  Lactate was 5.3. AST was 1838 and ALT was 1358 total bilirubin was 4.5.  On 10/07/2020 he had normal LFTs. CT head showed interval increase in size of the hemorrhagic metastasis in the left frontal lobe and left temporal lobe.  There was vasogenic edema surrounding the left temporal metastases.  CT chest showed interval development of groundglass and septal thickening in the right chest, parenchymal assessment limited by respiratory motion.  CT abdomen revealed enlarged prostate with impression upon the bladder base. Urine culture and blood culture was sent.  He was started on vancomycin, cefepime, Flagyl..  I am seeing the patient as his blood culture is positive for Klebsiella pneumonia   Past Medical History:  Diagnosis Date  . Abnormal prostate specific antigen 08/08/2012  . Adiposity 04/16/2015  . Chronic kidney disease (CKD), stage III (moderate) ([place]) 11/27/2016  . CVA (cerebral vascular accident) ([place]) 06/17/2016  . Diabetes mellitus without complication ([place])   . Diverticulosis of sigmoid colon 04/16/2015  . Dyslipidemia 03/18/2015  . Hemorrhoids, internal 04/16/2015  . Hypercholesteremia 04/16/2015  . Hyperlipidemia   . Hypertension   . Primary cancer of left upper lobe of lung ([place])   . Pulmonary embolism  ([place])   . Wears dentures    partial upper    Past Surgical History:  Procedure Laterality Date  . COLONOSCOPY    . COLONOSCOPY WITH PROPOFOL N/A 05/06/2015   Procedure: COLONOSCOPY WITH PROPOFOL;  Surgeon: Lucilla Lame, MD;  Location: [facility];  Service: Endoscopy;  Laterality: N/A;  ASCENDING COLON POLYPS X 2 TERMINAL ILEUM BIOPSY RANDOM COLON BX. TRANSVERSE COLON POLYP SIGMOID COLON POLYP  . ESOPHAGOGASTRODUODENOSCOPY (EGD) WITH PROPOFOL N/A 05/06/2015   Procedure: ESOPHAGOGASTRODUODENOSCOPY (EGD) WITH PROPOFOL;  Surgeon: Lucilla Lame, MD;  Location: [facility];  Service: Endoscopy;  Laterality: N/A;  GASTRIC BIOPSY X1  . KIDNEY STONE SURGERY  2017    Social History   Socioeconomic History  . Marital status: Married    Spouse name: Melissa   . Number of children: 3  . Years of education: Not on file  . Highest education level: Not on file  Occupational History  . Occupation: disable     Comment: from CVA and lung cancer  Tobacco Use  . Smoking status: Never Smoker  . Smokeless tobacco: Never Used  Vaping Use  . Vaping Use: Never used  Substance and Sexual Activity  . Alcohol use: No    Alcohol/week: 0.0 standard drinks  . Drug use: No  . Sexual activity: Yes    Partners: Female  Other Topics Concern  . Not on file  Social History Narrative   Used to work until 2 years ago when got sick with DVT leg , PE , CVA and found out he had lung cancer. He has medicare  now    Social Determinants of Health   Financial Resource Strain: High Risk  . Difficulty of Paying Living Expenses: Very hard  Food Insecurity: No Food Insecurity  . Worried About Charity fundraiser in the Last Year: Never true  . Ran Out of Food in the Last Year: Never true  Transportation Needs: No Transportation Needs  . Lack of Transportation (Medical): No  . Lack of Transportation (Non-Medical): No  Physical Activity: Inactive  . Days of Exercise per Week: 0 days  . Minutes of  Exercise per Session: 0 min  Stress: No Stress Concern Present  . Feeling of Stress : Not at all  Social Connections: Moderately Integrated  . Frequency of Communication with Friends and Family: More than three times a week  . Frequency of Social Gatherings with Friends and Family: Three times a week  . Attends Religious Services: More than 4 times per year  . Active Member of Clubs or Organizations: No  . Attends Archivist Meetings: Never  . Marital Status: Married  Human resources officer Violence: Not At Risk  . Fear of Current or Ex-Partner: No  . Emotionally Abused: No  . Physically Abused: No  . Sexually Abused: No    Family History  Problem Relation Age of Onset  . Diabetes Mother   . Diabetes Father   . CAD Father   . Dementia Father   . Diabetes Sister   . Cancer Maternal Uncle        Prostate  . Cancer Cousin        prostate   No Known Allergies I? Current Facility-Administered Medications  Medication Dose Route Frequency Provider Last Rate Last Admin  . 0.9 %  sodium chloride infusion   Intravenous Continuous Fritzi Mandes, MD 50 mL/hr at 11/15/20 0927 Rate Change at 11/15/20 0927  . cefTRIAXone (ROCEPHIN) 2 g in sodium chloride 0.9 % 100 mL IVPB  2 g Intravenous Q24H Sharion Settler, NP   Stopped at 11/15/20 0540  . dexamethasone (DECADRON) injection 4 mg  4 mg Intravenous Q6H Fritzi Mandes, MD      . entrectinib (ROZLYTREK) capsule 600 mg  600 mg Oral Daily Tu, Ching T, DO      . gabapentin (NEURONTIN) capsule 300 mg  300 mg Oral QHS Tu, Ching T, DO   300 mg at 11/14/20 2239  . insulin aspart (novoLOG) injection 0-15 Units  0-15 Units Subcutaneous TID WC Tu, Ching T, DO   11 Units at 11/15/20 0810  . insulin aspart (novoLOG) injection 0-5 Units  0-5 Units Subcutaneous QHS Tu, Ching T, DO   3 Units at 11/14/20 2239  . iohexol (OMNIPAQUE) 350 MG/ML injection 60 mL  60 mL Intravenous Once PRN Delman Kitten, MD      . levETIRAcetam (KEPPRA) IVPB 500 mg/100 mL premix   500 mg Intravenous Q12H Tu, Ching T, DO   Stopped at 11/15/20 0825   Current Outpatient Medications  Medication Sig Dispense Refill  . atorvastatin (LIPITOR) 40 MG tablet Take 1 tablet (40 mg total) by mouth daily. 90 tablet 0  . blood glucose meter kit and supplies Dispense based on patient and insurance preference (Accuchek Aviva Plus, Accuchek Nano). Use up to four times daily as directed. (FOR ICD-10 E10.9, E11.9). 1 each 0  . entrectinib (ROZLYTREK) 200 MG capsule Take 3 capsules (600 mg total) by mouth daily. (Patient taking differently: Take 600 mg by mouth daily. Pt reports taking 2 pills daily per MD) 90  capsule 6  . finasteride (PROSCAR) 5 MG tablet Take 1 tablet (5 mg total) by mouth daily. 90 tablet 3  . gabapentin (NEURONTIN) 300 MG capsule Take 1 capsule by mouth at bedtime 30 capsule 0  . Insulin Glargine-Lixisenatide (SOLIQUA) 100-33 UNT-MCG/ML SOPN Inject 15-30 Units into the skin daily. 15 mL 1  . Insulin Pen Needle (NOVOFINE PEN NEEDLE) 32G X 6 MM MISC 1 each by Does not apply route daily. 100 each 2  . metFORMIN (GLUCOPHAGE-XR) 750 MG 24 hr tablet Take 2 tablets (1,500 mg total) by mouth daily. (Patient taking differently: Take 750 mg by mouth 2 (two) times daily.) 180 tablet 0  . ondansetron (ZOFRAN) 8 MG tablet One pill every 8 hours as needed for nausea/vomitting. 40 tablet 1  . rivaroxaban (XARELTO) 20 MG TABS tablet TAKE 1 TABLET BY MOUTH ONCE DAILY WITH SUPPER 30 tablet 6  . tamsulosin (FLOMAX) 0.4 MG CAPS capsule Take 1 capsule by mouth once daily in the morning 90 capsule 3     Abtx:  Anti-infectives (From admission, onward)   Start     Dose/Rate Route Frequency Ordered Stop   11/15/20 0500  cefTRIAXone (ROCEPHIN) 2 g in sodium chloride 0.9 % 100 mL IVPB        2 g 200 mL/hr over 30 Minutes Intravenous Every 24 hours 11/15/20 0447     11/14/20 2000  metroNIDAZOLE (FLAGYL) IVPB 500 mg  Status:  Discontinued        500 mg 100 mL/hr over 60 Minutes Intravenous Every  8 hours 11/14/20 1859 11/14/20 2023   11/14/20 1530  ceFEPIme (MAXIPIME) 2 g in sodium chloride 0.9 % 100 mL IVPB        2 g 200 mL/hr over 30 Minutes Intravenous  Once 11/14/20 1521 11/14/20 1609   11/14/20 1530  vancomycin (VANCOCIN) IVPB 1000 mg/200 mL premix        1,000 mg 200 mL/hr over 60 Minutes Intravenous  Once 11/14/20 1521 11/14/20 1657      REVIEW OF SYSTEMS:  Const: negative fever, negative chills, negative weight loss Eyes: negative diplopia or visual changes, negative eye pain ENT: negative coryza, negative sore throat Resp: negative cough, hemoptysis, dyspnea Cards: negative for chest pain, palpitations, lower extremity edema GU: has difficulty in initiating micturition, he has to strain GI: Negative for abdominal pain, diarrhea, bleeding, constipation Skin: negative for rash and pruritus Heme: negative for easy bruising and gum/nose bleeding EB:XIDH weakness Neurolo some memory issues Psych: negative for feelings of anxiety, depression  Endocrine:  diabetes Allergy/Immunology- negative for any medication or food allergies ?  Objective:  VITALS:  BP 116/90   Pulse 62   Temp 98.3 F (36.8 C) (Oral)   Resp (!) 21   Ht '5\' 4"'  (1.626 m)   Wt 75.8 kg   SpO2 95%   BMI 28.68 kg/m  PHYSICAL EXAM:  General: Alert, cooperative, no distress, some confusion Head: Normocephalic, without obvious abnormality, atraumatic. Eyes: Conjunctivae clear, anicteric sclerae. Pupils are equal ENT Nares normal. No drainage or sinus tenderness. Lips, mucosa, and tongue normal. No Thrush Neck: Supple, symmetrical, no adenopathy, thyroid: non tender no carotid bruit and no JVD. Back: No CVA tenderness. Lungs: b/l air entry. Heart: Regular rate and rhythm, no murmur, rub or gallop. Abdomen: Soft, non-tender,not distended. Bowel sounds normal. No masses Extremities: atraumatic, no cyanosis. No edema. No clubbing Skin: No rashes or lesions. Or bruising Lymph: Cervical,  supraclavicular normal. Neurologic: Grossly non-focal Pertinent Labs Lab Results CBC  Component Value Date/Time   WBC 9.8 11/15/2020 0420   RBC 4.86 11/15/2020 0420   HGB 14.4 11/15/2020 0420   HGB 14.1 01/01/2016 1000   HCT 42.9 11/15/2020 0420   HCT 42.7 01/01/2016 1000   PLT 184 11/15/2020 0420   PLT 307 01/01/2016 1000   MCV 88.3 11/15/2020 0420   MCV 86 01/01/2016 1000   MCH 29.6 11/15/2020 0420   MCHC 33.6 11/15/2020 0420   RDW 14.6 11/15/2020 0420   RDW 14.8 01/01/2016 1000   LYMPHSABS 0.4 (L) 11/14/2020 1453   LYMPHSABS 1.6 01/01/2016 1000   MONOABS 0.6 11/14/2020 1453   EOSABS 0.0 11/14/2020 1453   EOSABS 0.3 01/01/2016 1000   BASOSABS 0.0 11/14/2020 1453   BASOSABS 0.0 01/01/2016 1000    CMP Latest Ref Rng & Units 11/15/2020 11/14/2020 10/07/2020  Glucose 70 - 99 mg/dL 285(H) 252(H) 184(H)  BUN 8 - 23 mg/dL 29(H) 31(H) 23  Creatinine 0.61 - 1.24 mg/dL 1.77(H) 2.18(H) 1.60(H)  Sodium 135 - 145 mmol/L 137 135 134(L)  Potassium 3.5 - 5.1 mmol/L 4.3 5.0 4.3  Chloride 98 - 111 mmol/L 106 101 100  CO2 22 - 32 mmol/L 20(L) 21(L) 23  Calcium 8.9 - 10.3 mg/dL 9.1 9.5 9.2  Total Protein 6.5 - 8.1 g/dL 7.4 7.8 7.5  Total Bilirubin 0.3 - 1.2 mg/dL 5.7(H) 4.5(H) 0.6  Alkaline Phos 38 - 126 U/L 175(H) 211(H) 52  AST 15 - 41 U/L 629(H) 1,838(H) 22  ALT 0 - 44 U/L 899(H) 1,358(H) 30      Microbiology: Recent Results (from the past 240 hour(s))  Blood Culture (routine x 2)     Status: None (Preliminary result)   Collection Time: 11/14/20  2:53 PM   Specimen: BLOOD  Result Value Ref Range Status   Specimen Description BLOOD RIGHT ANTECUBITAL  Final   Special Requests   Final    BOTTLES DRAWN AEROBIC AND ANAEROBIC Blood Culture adequate volume   Culture  Setup Time   Final    GRAM NEGATIVE RODS IN BOTH AEROBIC AND ANAEROBIC BOTTLES CRITICAL VALUE NOTED.  VALUE IS CONSISTENT WITH PREVIOUSLY REPORTED AND CALLED VALUE. Performed at [facility], [address] [place], [place] [address]    Culture GRAM NEGATIVE RODS  Final   Report Status PENDING  Incomplete  Resp Panel by RT-PCR (Flu A&B, Covid) Nasopharyngeal Swab     Status: None   Collection Time: 11/14/20  3:49 PM   Specimen: Nasopharyngeal Swab; Nasopharyngeal(NP) swabs in vial transport medium  Result Value Ref Range Status   SARS Coronavirus 2 by RT PCR NEGATIVE NEGATIVE Final    Comment: (NOTE) SARS-CoV-2 target nucleic acids are NOT DETECTED.  The SARS-CoV-2 RNA is generally detectable in upper respiratory specimens during the acute phase of infection. The lowest concentration of SARS-CoV-2 viral copies this assay can detect is 138 copies/mL. A negative result does not preclude SARS-Cov-2 infection and should not be used as the sole basis for treatment or other patient management decisions. A negative result may occur with  improper specimen collection/handling, submission of specimen other than nasopharyngeal swab, presence of viral mutation(s) within the areas targeted by this assay, and inadequate number of viral copies(<138 copies/mL). A negative result must be combined with clinical observations, patient history, and epidemiological information. The expected result is Negative.  Fact Sheet for Patients:  EntrepreneurPulse.com.au  Fact Sheet for Healthcare Providers:  IncredibleEmployment.be  This test is no t yet approved or cleared by the [place]  FDA and  has been authorized for detection and/or diagnosis of SARS-CoV-2 by FDA under an Emergency Use Authorization (EUA). This EUA will remain  in effect (meaning this test can be used) for the duration of the COVID-19 declaration under Section 564(b)(1) of the Act, 21 U.S.C.section 360bbb-3(b)(1), unless the authorization is terminated  or revoked sooner.       Influenza A by PCR NEGATIVE NEGATIVE Final   Influenza B by PCR NEGATIVE NEGATIVE Final    Comment: (NOTE) The  Xpert Xpress SARS-CoV-2/FLU/RSV plus assay is intended as an aid in the diagnosis of influenza from Nasopharyngeal swab specimens and should not be used as a sole basis for treatment. Nasal washings and aspirates are unacceptable for Xpert Xpress SARS-CoV-2/FLU/RSV testing.  Fact Sheet for Patients: EntrepreneurPulse.com.au  Fact Sheet for Healthcare Providers: IncredibleEmployment.be  This test is not yet approved or cleared by the [place] FDA and has been authorized for detection and/or diagnosis of SARS-CoV-2 by FDA under an Emergency Use Authorization (EUA). This EUA will remain in effect (meaning this test can be used) for the duration of the COVID-19 declaration under Section 564(b)(1) of the Act, 21 U.S.C. section 360bbb-3(b)(1), unless the authorization is terminated or revoked.  Performed at [facility], [address] [place], [place] [address]   Blood Culture (routine x 2)     Status: None (Preliminary result)   Collection Time: 11/14/20  3:49 PM   Specimen: BLOOD  Result Value Ref Range Status   Specimen Description BLOOD LEFT ANTECUBITAL  Final   Special Requests   Final    BOTTLES DRAWN AEROBIC AND ANAEROBIC Blood Culture adequate volume   Culture  Setup Time   Final    GRAM NEGATIVE RODS IN BOTH AEROBIC AND ANAEROBIC BOTTLES Organism ID to follow CRITICAL RESULT CALLED TO, READ BACK BY AND VERIFIED WITH: Tana Felts RN [phone] 11/15/20 HNM Performed at [facility], [address] [place], [place] [address]    Culture GRAM NEGATIVE RODS  Final   Report Status PENDING  Incomplete  Blood Culture ID Panel (Reflexed)     Status: Abnormal   Collection Time: 11/14/20  3:49 PM  Result Value Ref Range Status   Enterococcus faecalis NOT DETECTED NOT DETECTED Final   Enterococcus Faecium NOT DETECTED NOT DETECTED Final   Listeria monocytogenes NOT DETECTED NOT DETECTED Final   Staphylococcus species NOT  DETECTED NOT DETECTED Final   Staphylococcus aureus (BCID) NOT DETECTED NOT DETECTED Final   Staphylococcus epidermidis NOT DETECTED NOT DETECTED Final   Staphylococcus lugdunensis NOT DETECTED NOT DETECTED Final   Streptococcus species NOT DETECTED NOT DETECTED Final   Streptococcus agalactiae NOT DETECTED NOT DETECTED Final   Streptococcus pneumoniae NOT DETECTED NOT DETECTED Final   Streptococcus pyogenes NOT DETECTED NOT DETECTED Final   A.calcoaceticus-baumannii NOT DETECTED NOT DETECTED Final   Bacteroides fragilis NOT DETECTED NOT DETECTED Final   Enterobacterales DETECTED (A) NOT DETECTED Final    Comment: Enterobacterales represent a large order of gram negative bacteria, not a single organism. CRITICAL RESULT CALLED TO, READ BACK BY AND VERIFIED WITH: Tana Felts RN 1914 11/15/20 HNM    Enterobacter cloacae complex NOT DETECTED NOT DETECTED Final   Escherichia coli NOT DETECTED NOT DETECTED Final   Klebsiella aerogenes NOT DETECTED NOT DETECTED Final   Klebsiella oxytoca NOT DETECTED NOT DETECTED Final   Klebsiella pneumoniae DETECTED (A) NOT DETECTED Final    Comment: CRITICAL RESULT CALLED TO, READ BACK BY AND VERIFIED WITH: Tana Felts  RN [phone] 11/15/20 HNM    Proteus species NOT DETECTED NOT DETECTED Final   Salmonella species NOT DETECTED NOT DETECTED Final   Serratia marcescens NOT DETECTED NOT DETECTED Final   Haemophilus influenzae NOT DETECTED NOT DETECTED Final   Neisseria meningitidis NOT DETECTED NOT DETECTED Final   Pseudomonas aeruginosa NOT DETECTED NOT DETECTED Final   Stenotrophomonas maltophilia NOT DETECTED NOT DETECTED Final   Candida albicans NOT DETECTED NOT DETECTED Final   Candida auris NOT DETECTED NOT DETECTED Final   Candida glabrata NOT DETECTED NOT DETECTED Final   Candida krusei NOT DETECTED NOT DETECTED Final   Candida parapsilosis NOT DETECTED NOT DETECTED Final   Candida tropicalis NOT DETECTED NOT DETECTED Final   Cryptococcus  neoformans/gattii NOT DETECTED NOT DETECTED Final   CTX-M ESBL NOT DETECTED NOT DETECTED Final   Carbapenem resistance IMP NOT DETECTED NOT DETECTED Final   Carbapenem resistance KPC NOT DETECTED NOT DETECTED Final   Carbapenem resistance NDM NOT DETECTED NOT DETECTED Final   Carbapenem resist OXA 48 LIKE NOT DETECTED NOT DETECTED Final   Carbapenem resistance VIM NOT DETECTED NOT DETECTED Final    Comment: Performed at [facility], [address] [place], [place] [address]    IMAGING RESULTS:  MRI increased size of the left temporal lobe metastatic lesion with marked worsening of surrounding edema Increase size of left frontal metastasis  I have personally reviewed the films ? Impression/Recommendation  Encephalopathy? Infection? Brain mets ? ?Klebsiella pneumonia bacteremia UA did not show any WBC. He has prostatomegaly.  We will need to check for residual urine and for incomplete emptying CT scan of the abdomen did not show any hydronephrosis. Rule out liver source like infected hepatic cyst versus ascending cholangitis  AKI on CKD  Acute hepatitis with increased bilirubin, trnsaminitis-unclear etiology  ? due to infection ? rule out medication induced.   Metatstatic lung carcinoma with brain mets which is worsening  ___________________________________________________ Discussed with patient,and wife Note:  This document was prepared using Dragon voice recognition software and may include unintentional dictation errors.

## 2020-11-15 NOTE — ED Notes (Signed)
Patient resting on stretcher, calm and cooperative, wife at bedside, vitals stable, patient/wife aware of oxygen 90%, patient refused nasal cannula at this time, patient awaiting inpatient bed, IVF infusing.

## 2020-11-15 NOTE — Progress Notes (Signed)
[facility] NAME: Melvin Willis    MR#:  [id number]  DATE OF BIRTH:  12/04/1950  SUBJECTIVE:  Seen in the ER patient will alert and oriented. No focal deficit. Patient's wife in the ER room. Denies any fever chest pain abdominal pain. Would like to eat something.  REVIEW OF SYSTEMS:   Review of Systems  Constitutional: Positive for malaise/fatigue. Negative for chills, fever and weight loss.  HENT: Negative for ear discharge, ear pain and nosebleeds.   Eyes: Negative for blurred vision, pain and discharge.  Respiratory: Negative for sputum production, shortness of breath, wheezing and stridor.   Cardiovascular: Negative for chest pain, palpitations, orthopnea and PND.  Gastrointestinal: Negative for abdominal pain, diarrhea, nausea and vomiting.  Genitourinary: Negative for frequency and urgency.  Musculoskeletal: Negative for back pain and joint pain.  Neurological: Positive for weakness. Negative for sensory change, speech change and focal weakness.  Psychiatric/Behavioral: Negative for depression and hallucinations. The patient is not nervous/anxious.    Tolerating Diet: Tolerating PT:   DRUG ALLERGIES:  No Known Allergies  VITALS:  Blood pressure 124/84, pulse 66, temperature 98.3 F (36.8 C), temperature source Oral, resp. rate 20, height 5\' 4"  (1.626 m), weight 75.8 kg, SpO2 92 %.  PHYSICAL EXAMINATION:   Physical Exam  GENERAL:  70 y.o.-year-old patient lying in the bed with no acute distress. Appears ill and fatigue HEENT: Head atraumatic, normocephalic. Oropharynx and nasopharynx clear.  LUNGS: Normal breath sounds bilaterally, no wheezing, rales, rhonchi. No use of accessory muscles of respiration.  CARDIOVASCULAR: S1, S2 normal. No murmurs, rubs, or gallops.  ABDOMEN: Soft, nontender, nondistended. Bowel sounds present. No organomegaly or mass.  EXTREMITIES: No cyanosis, clubbing or edema b/l.     NEUROLOGIC: Cranial nerves II through XII are intact. No focal Motor or sensory deficits b/l.   PSYCHIATRIC:  patient is alert and oriented x 3.  SKIN: No obvious rash, lesion, or ulcer.   LABORATORY PANEL:  CBC Recent Labs  Lab 11/15/20 0420  WBC 9.8  HGB 14.4  HCT 42.9  PLT 184    Chemistries  Recent Labs  Lab 11/15/20 0420  NA 137  K 4.3  CL 106  CO2 20*  GLUCOSE 285*  BUN 29*  CREATININE 1.77*  CALCIUM 9.1  AST 629*  ALT 899*  ALKPHOS 175*  BILITOT 5.7*   Cardiac Enzymes No results for input(s): TROPONINI in the last 168 hours. RADIOLOGY:  CT ABDOMEN PELVIS WO CONTRAST  Result Date: 11/14/2020 CLINICAL DATA:  Lung cancer with brain metastases, febrile with acute renal failure and sepsis. 70 year old male. EXAM: CT CHEST, ABDOMEN AND PELVIS WITHOUT CONTRAST TECHNIQUE: Multidetector CT imaging of the chest, abdomen and pelvis was performed following the standard protocol without IV contrast. COMPARISON:  August 19, 2020 FINDINGS: CT CHEST FINDINGS Cardiovascular: Heart size accentuated by low lung volumes likely mildly enlarged. Three-vessel coronary artery disease. No pericardial effusion. Aortic caliber is normal. Scattered atherosclerosis. Central pulmonary vasculature with normal caliber. Limited assessment of cardiovascular structures given lack of intravenous contrast. Mediastinum/Nodes: No mediastinal lymphadenopathy. Calcified lymph nodes in the chest are unchanged about the AP window. Small pre-vascular lymph node on image 22 unchanged at 6 mm. Lungs/Pleura: Interval development of ground-glass and septal thickening in the RIGHT chest, parenchymal assessment limited by respiratory motion. No lobar level consolidative changes. Airways are patent. No pleural effusion. LEFT hilar distortion in the setting of post treatment changes in the LEFT chest unchanged from  previous imaging. Musculoskeletal: See below for full musculoskeletal details. No acute bone finding about  the bony thorax. CT ABDOMEN PELVIS FINDINGS Hepatobiliary: Hepatic cysts as before. Septation in the largest with subtle calcification unchanged. Largest measuring approximately 4 cm. Partially intrahepatic gallbladder without pericholecystic stranding. No biliary duct distension. Pancreas: Pancreas without contour abnormality. Subtle stranding may be present about the head of the pancreas versus motion artifact. No gross ductal dilation. Spleen: Spleen normal size and contour. Adrenals/Urinary Tract: Adrenal glands are normal. Renal cortical scarring bilaterally. No signs of frank hydronephrosis. Urinary bladder with smooth contours. Marked prostatomegaly is similar to the prior study with impression upon the bladder base Stomach/Bowel: Stomach is under distended. Mild gastric thickening suggested. Moderate-sized duodenal diverticulum without surrounding stranding. No sign of bowel obstruction or acute bowel process. Colonic diverticulosis. Vascular/Lymphatic: Aortic atheromatous plaque with calcification, no aneurysmal dilation of the abdominal aorta. There is no gastrohepatic or hepatoduodenal ligament lymphadenopathy. No retroperitoneal or mesenteric lymphadenopathy. No pelvic sidewall lymphadenopathy. Reproductive: Prostatomegaly as before. Other: No ascites.  No free air. Musculoskeletal: No acute bone finding. No destructive bone process. IMPRESSION: 1. Interval development of ground-glass and septal thickening in the RIGHT chest, parenchymal assessment limited by respiratory motion. Findings appear to be primarily related to atelectasis. Developing infection or even early viral or atypical process could also have this appearance. 2. Unchanged appearance of post treatment changes in the LEFT chest. 3. Subtle stranding may be present about the head of the pancreas versus motion artifact. Correlate with pancreatic enzymes. 4. Under distension versus mild gastric thickening. Could be related to gastritis 5.  Marked prostatomegaly as before. 6. Three-vessel coronary artery disease. 7. Colonic diverticulosis without evidence of acute bowel process. 8. Hepatic cysts. 9. Aortic atherosclerosis. Aortic Atherosclerosis (ICD10-I70.0). Electronically Signed   By: Zetta Bills M.D.   On: 11/14/2020 17:46   CT Head Wo Contrast  Result Date: 11/14/2020 CLINICAL DATA:  Delirium. Lung cancer with brain metastases. Lethargic and febrile with sepsis. EXAM: CT HEAD WITHOUT CONTRAST TECHNIQUE: Contiguous axial images were obtained from the base of the skull through the vertex without intravenous contrast. COMPARISON:  MRI 06/04/2020. FINDINGS: Brain: Hemorrhagic metastases in the left frontal lobe and anterior left temporal lobe. Comparison across modalities to the prior MRI is difficult, but there is suggestion of interval increase in size of these lesions which may relate to interval internal hemorrhage. There is also suggestion of increased vasogenic edema surrounding the left temporal metastasis. Additional metastases are not well characterized on this noncontrast head CT. Parieto-occipital encephalomalacia and white matter changes are grossly similar cross modalities. Vascular: Calcific atherosclerosis. No hyperdense vessel identified. Skull: No acute fracture. Sinuses/Orbits: Mild paranasal sinus mucosal thickening. Other: No mastoid effusions. IMPRESSION: 1. Comparison across modalities to the prior MRI is difficult, but there is suggestion of interval increase in size of hemorrhagic metastases in the left frontal lobe and left temporal lobe which may relate to interval internal hemorrhage. There is also suggestion of increased vasogenic edema surrounding the left temporal metastasis. An MRI with contrast could further characterize and also better evaluate for new metastases if clinically indicated. 2. Additional known metastases are poorly evaluated on this noncontrast head CT. 3. Grossly similar parieto-occipital  encephalomalacia and white matter changes. Findings discussed with Dr. Jacqualine Code Via telephone at 5:26 PM. Electronically Signed   By: Margaretha Sheffield MD   On: 11/14/2020 17:30   CT Chest Wo Contrast  Result Date: 11/14/2020 CLINICAL DATA:  Lung cancer with brain metastases, febrile with acute  renal failure and sepsis. 70 year old male. EXAM: CT CHEST, ABDOMEN AND PELVIS WITHOUT CONTRAST TECHNIQUE: Multidetector CT imaging of the chest, abdomen and pelvis was performed following the standard protocol without IV contrast. COMPARISON:  August 19, 2020 FINDINGS: CT CHEST FINDINGS Cardiovascular: Heart size accentuated by low lung volumes likely mildly enlarged. Three-vessel coronary artery disease. No pericardial effusion. Aortic caliber is normal. Scattered atherosclerosis. Central pulmonary vasculature with normal caliber. Limited assessment of cardiovascular structures given lack of intravenous contrast. Mediastinum/Nodes: No mediastinal lymphadenopathy. Calcified lymph nodes in the chest are unchanged about the AP window. Small pre-vascular lymph node on image 22 unchanged at 6 mm. Lungs/Pleura: Interval development of ground-glass and septal thickening in the RIGHT chest, parenchymal assessment limited by respiratory motion. No lobar level consolidative changes. Airways are patent. No pleural effusion. LEFT hilar distortion in the setting of post treatment changes in the LEFT chest unchanged from previous imaging. Musculoskeletal: See below for full musculoskeletal details. No acute bone finding about the bony thorax. CT ABDOMEN PELVIS FINDINGS Hepatobiliary: Hepatic cysts as before. Septation in the largest with subtle calcification unchanged. Largest measuring approximately 4 cm. Partially intrahepatic gallbladder without pericholecystic stranding. No biliary duct distension. Pancreas: Pancreas without contour abnormality. Subtle stranding may be present about the head of the pancreas versus motion artifact.  No gross ductal dilation. Spleen: Spleen normal size and contour. Adrenals/Urinary Tract: Adrenal glands are normal. Renal cortical scarring bilaterally. No signs of frank hydronephrosis. Urinary bladder with smooth contours. Marked prostatomegaly is similar to the prior study with impression upon the bladder base Stomach/Bowel: Stomach is under distended. Mild gastric thickening suggested. Moderate-sized duodenal diverticulum without surrounding stranding. No sign of bowel obstruction or acute bowel process. Colonic diverticulosis. Vascular/Lymphatic: Aortic atheromatous plaque with calcification, no aneurysmal dilation of the abdominal aorta. There is no gastrohepatic or hepatoduodenal ligament lymphadenopathy. No retroperitoneal or mesenteric lymphadenopathy. No pelvic sidewall lymphadenopathy. Reproductive: Prostatomegaly as before. Other: No ascites.  No free air. Musculoskeletal: No acute bone finding. No destructive bone process. IMPRESSION: 1. Interval development of ground-glass and septal thickening in the RIGHT chest, parenchymal assessment limited by respiratory motion. Findings appear to be primarily related to atelectasis. Developing infection or even early viral or atypical process could also have this appearance. 2. Unchanged appearance of post treatment changes in the LEFT chest. 3. Subtle stranding may be present about the head of the pancreas versus motion artifact. Correlate with pancreatic enzymes. 4. Under distension versus mild gastric thickening. Could be related to gastritis 5. Marked prostatomegaly as before. 6. Three-vessel coronary artery disease. 7. Colonic diverticulosis without evidence of acute bowel process. 8. Hepatic cysts. 9. Aortic atherosclerosis. Aortic Atherosclerosis (ICD10-I70.0). Electronically Signed   By: Zetta Bills M.D.   On: 11/14/2020 17:46   MR BRAIN W WO CONTRAST  Result Date: 11/15/2020 CLINICAL DATA:  Encephalopathy EXAM: MRI HEAD WITHOUT AND WITH CONTRAST  TECHNIQUE: Multiplanar, multiecho pulse sequences of the brain and surrounding structures were obtained without and with intravenous contrast. CONTRAST:  7.16mL GADAVIST GADOBUTROL 1 MMOL/ML IV SOLN COMPARISON:  06/04/2020 FINDINGS: Brain: No acute infarct, mass effect or extra-axial collection. Multiple sites of chronic hemorrhage, greatest in the left frontal lobe and right parietal lobe. Large amount of abnormal T2-weighted signal within the white matter. There are old bilateral cerebellar infarcts. Contrast enhancing lesions are as follows: 1. Left temporal lobe 1.9 cm, previously 1.2 cm, series 21, image 11. Marked worsening of edema surrounding this lesion. 2. Left frontal lobe, 1.0 cm, previously 0.8 cm, image 15 3.  Punctate midbrain lesion, unchanged, image 13 There are no new lesions identified. Vascular: Major flow voids are preserved. Skull and upper cervical spine: Normal calvarium and skull base. Visualized upper cervical spine and soft tissues are normal. Sinuses/Orbits:Right mastoid effusion. Nasopharynx is clear. Left maxillary retention cyst. Normal orbits. IMPRESSION: 1. Increased size of left temporal lobe metastatic lesion with marked worsening of surrounding edema. 2. Increased size of left frontal metastasis. 3. Unchanged appearance of punctate midbrain lesion. No new lesions. 4. Old bilateral cerebellar infarcts and findings of chronic microvascular disease. Electronically Signed   By: Ulyses Jarred M.D.   On: 11/15/2020 00:00   US Abdomen Limited RUQ (LIVER/GB)  Result Date: 11/14/2020 CLINICAL DATA:  Right upper quadrant pain and elevated LFTs EXAM: ULTRASOUND ABDOMEN LIMITED RIGHT UPPER QUADRANT COMPARISON:  CT from earlier in the same day. FINDINGS: Gallbladder: Gallbladder is well distended without wall thickening. Multiple folds are noted similar to that seen on prior CT. No cholelithiasis is noted. Common bile duct: Diameter: 3.4 mm. Liver: Hepatic cysts are noted similar to that  seen on prior CT examination. Some septation is noted similar to that noted on prior exam. No other focal abnormality is noted. Portal vein is patent on color Doppler imaging with normal direction of blood flow towards the liver. Other: None. IMPRESSION: Paddock cysts similar to that seen on prior CT examination. No mass lesion is seen. Multiple folds within the gallbladder without complicating factors. Electronically Signed   By: Inez Catalina M.D.   On: 11/14/2020 20:02   ASSESSMENT AND PLAN:  Melvin Willis is a 70 y.o. 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  Acute metabolic encephalopathy Sepsis POA --Multifactorial from worsening hemorrhagic metastasis and increased vasogenic edema as well as sepsis from questionable pneumonia versus intra-abdominal infection --Neurosurgery Dr Rhea Bleacher input noted--  Recommend Keppra 500 BID for seizure prophylaxis and continue IV Decadron 4 mg Q ID for vasoedema --CT head shows changes in the size of hemorrhagic metastasis with increased vasogenic edema. -- MRI brain:Increased size of left temporal lobe metastatic lesion with marked worsening of surrounding edema. 2. Increased size of left frontal metastasis. 3. Unchanged appearance of punctate midbrain lesion. No new lesions. 4. Old bilateral cerebellar infarcts and findings of chronic microvascular disease. Frequent neurochecks q4hrs  Hx of stage IV adenocarcinoma of the lung with brain metastasis --Worsening Vasogenic edema and possible increase hemorrhage around metastasis --Management as above --Follows with Dr. Rogue Bussing --Consider  palliative consult in the morning- had extensive discussion with wife and pt regarding his CODE STATUS.  He remains full code.  Severe sepsis secondary  to questionable pneumonia/probable hepatitis --Patient presented with fever, tachycardia and elevated lactate of greater than 4--3.3 --Cont IVF --BCID 4/4 [place] -ID consultation placed --CT chest showing groundglass appearance of the right lung which could be atelectasis versus infection-wife denies any respiratory symptoms but will go ahead and cover empirically with vancomycin and cefepime given that patient is ill-appearing --Also has significantly elevated transaminitis, increased INR-concerning for acute hepatitis.  Acute hypoxic respiratory failure --Admitted on 2 L.  Secondary to probable pneumonia --Received vancomycin and cefepime in ER--further abxs per ID  Transaminitis/supratherapeutic INR Continue management as above Follow with repeat CMP in the morning  Type 2 diabetes --Hemoglobin A1c of 10.5 on 2/24 --Placed on moderate sliding scale,  can uptitrate depending on hyperglycemia while on steroids  History of DVT/PE --Hold Xarelto given possible worsening hemorrhagic metastasis of the brain for atleast 1 week per Neuro sx recs  Acute on CKD stage III Creatinine of 2.18 from prior 1.6 Follow with repeat labs in the morning  DVT prophylaxis:.SCD Code Status: Full- Family Communication: Plan discussed with  wife at bedside  disposition Plan: Home with at least 2 midnight stays  Consults called: Oncology, neurosx Admission status: inpatient  Status is: Inpatient  Remains inpatient appropriate because:Inpatient level of care appropriate due to severity of illness   Dispo: The patient is from: Home  Anticipated d/c is to: Home  Patient currently is not medically stable to d/c.              Difficult to place patient No Level of care: Med-Surg        TOTAL TIME TAKING CARE OF THIS PATIENT: *35* minutes.  >50% time spent on counselling and coordination of care  Note: This dictation was prepared with Dragon dictation  along with smaller phrase technology. Any transcriptional errors that result from this process are unintentional.  Fritzi Mandes M.D    [facility]   CC: Primary care physician; [name], MDPatient ID: Melvin Willis, male   DOB: 1950/09/18, 70 y.o.   MRN: [id number]

## 2020-11-15 NOTE — Assessment & Plan Note (Addendum)
#  70 year old male patient with a history of metastatic lung cancer to brain is currently admitted to hospital for mental status changes  #Mental status changes-likely secondary to progressive brain metastases; CT/MRI concerning for progressive disease with edema-currently on tapering dose of steroids.   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  CT chest and pelvis scan overall stable disease.  We will discontinue entrectinib- given the elevated LFTs/CNS progression.  Patient will likely need to go on lorlatinib.   #Elevated LFTs-unclear etiology noted for obstruction noted. ? Entretinib vs others hypotension etc. no obvious evidence of metastatic disease noted-improving.HOLD entrectinib.   #Positive blood culture-Klebsiella pneumonia-on cephalosporin repeat cultures negative; plan outpatient oral cephalosporin.  Appreciate ID recommendations.  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  Continue hold Xarelto given the hemorrhagic transformation.  # acute renal failure on CKD stage III-creatinine 2.1 status post Foley catheter.;  History of BPH; follow-up with urology.  #Diabetes-poorly controlled especially on steroids.  As per primary service-plan glipizide; hold Metformin.  #Discussed with the patient; will plan to follow-up in approximately 1 week in the cancer center BMP/CBC.  Discussed with Dr. Manuella Ghazi.

## 2020-11-15 NOTE — Progress Notes (Addendum)
Inpatient Diabetes Program Recommendations  AACE/ADA: New Consensus Statement on Inpatient Glycemic Control (2015)  Target Ranges:  Prepandial:   less than 140 mg/dL      Peak postprandial:   less than 180 mg/dL (1-2 hours)      Critically ill patients:  140 - 180 mg/dL    Results for CASIN, FEDERICI (MRN [id number]) as of 11/15/2020 08:42  Ref. Range 11/14/2020 22:34 11/15/2020 08:04  Glucose-Capillary Latest Ref Range: 70 - 99 mg/dL 266 (H)  3 units NOVOLOG 307 (H)   Admit with: AMS/ Worsening intracranial disease from metastatic lung CA/ Severe sepsis secondary to questionable pneumonia/probable hepatitis  History: DM, Stage IV adenoCA of lung with known brain metastases, CKD   Home DM Meds: Soliqua 15-30 units Daily       Metformin 750 mg BID  Current Orders: Novolog Moderate Correction Scale/ SSI (0-15 units) TID AC + HS     MD- Note patient getting Decadron 10 mg Daily  Patient takes Soliqua 15-30 units Daily at home--This medication provides pt with Lantus on a 1:1 unit basis--For every 1 unit [place] he takes he gets 1 unit Lantus  Given he is getting Decadron, please consider ordering Lantus 15 units Daily to start    --Will follow patient during hospitalization--  Wyn Quaker RN, MSN, CDE Diabetes Coordinator Inpatient Glycemic Control Team Team Pager: [phone] (8a-5p)

## 2020-11-15 NOTE — ED Notes (Signed)
MD at bedside. 

## 2020-11-16 DIAGNOSIS — A419 Sepsis, unspecified organism: Secondary | ICD-10-CM | POA: Diagnosis not present

## 2020-11-16 DIAGNOSIS — G9341 Metabolic encephalopathy: Secondary | ICD-10-CM | POA: Diagnosis not present

## 2020-11-16 DIAGNOSIS — R7401 Elevation of levels of liver transaminase levels: Secondary | ICD-10-CM | POA: Diagnosis not present

## 2020-11-16 DIAGNOSIS — C7931 Secondary malignant neoplasm of brain: Secondary | ICD-10-CM | POA: Diagnosis not present

## 2020-11-16 LAB — GLUCOSE, CAPILLARY
Glucose-Capillary: 284 mg/dL — ABNORMAL HIGH (ref 70–99)
Glucose-Capillary: 294 mg/dL — ABNORMAL HIGH (ref 70–99)
Glucose-Capillary: 254 mg/dL — ABNORMAL HIGH (ref 70–99)
Glucose-Capillary: 214 mg/dL — ABNORMAL HIGH (ref 70–99)
Glucose-Capillary: 228 mg/dL — ABNORMAL HIGH (ref 70–99)

## 2020-11-16 LAB — URINE CULTURE: Culture: NO GROWTH

## 2020-11-16 LAB — LACTIC ACID, PLASMA: Lactic Acid, Venous: 1.7 mmol/L (ref 0.5–1.9)

## 2020-11-16 MED ORDER — ONDANSETRON HCL 4 MG/2ML IJ SOLN
4.0000 mg | INTRAMUSCULAR | Status: DC | PRN
  Administered 2020-11-16: 4 mg via INTRAVENOUS
  Filled 2020-11-16: qty 2

## 2020-11-16 NOTE — Progress Notes (Signed)
Melvin Willis at [facility] NAME: Melvin Willis    MR#:  [id number]  DATE OF BIRTH:  08-Oct-1950  SUBJECTIVE:   No focal deficit.  Denies any fever chest pain abdominal pain.A it sleepy this am No fever REVIEW OF SYSTEMS:   Review of Systems  Constitutional: Positive for malaise/fatigue. Negative for chills, fever and weight loss.  HENT: Negative for ear discharge, ear pain and nosebleeds.   Eyes: Negative for blurred vision, pain and discharge.  Respiratory: Negative for sputum production, shortness of breath, wheezing and stridor.   Cardiovascular: Negative for chest pain, palpitations, orthopnea and PND.  Gastrointestinal: Negative for abdominal pain, diarrhea, nausea and vomiting.  Genitourinary: Negative for frequency and urgency.  Musculoskeletal: Negative for back pain and joint pain.  Neurological: Positive for weakness. Negative for sensory change, speech change and focal weakness.  Psychiatric/Behavioral: Negative for depression and hallucinations. The patient is not nervous/anxious.    Tolerating Diet:yes Tolerating PT:   DRUG ALLERGIES:  No Known Allergies  VITALS:  Blood pressure 131/74, pulse (!) 56, temperature 97.7 F (36.5 C), resp. rate 18, height 5\' 4"  (1.626 m), weight 75.8 kg, SpO2 (!) 70 %.  PHYSICAL EXAMINATION:   Physical Exam  GENERAL:  70 y.o.-year-old patient lying in the bed with no acute distress. Appears ill and fatigueLUNGS: Normal breath sounds bilaterally, no wheezing, rales, rhonchi. No use of accessory muscles of respiration.  CARDIOVASCULAR: S1, S2 normal. No murmurs, rubs, or gallops.  ABDOMEN: Soft, nontender, nondistended. Bowel sounds present. No organomegaly or mass.  EXTREMITIES: No cyanosis, clubbing or edema b/l.    NEUROLOGIC: Cranial nerves II through XII are intact. No focal Motor or sensory deficits b/l.   PSYCHIATRIC:  patient is alert and oriented x 3.  SKIN: No obvious rash, lesion,  or ulcer.   LABORATORY PANEL:  CBC Recent Labs  Lab 11/15/20 0420  WBC 9.8  HGB 14.4  HCT 42.9  PLT 184    Chemistries  Recent Labs  Lab 11/15/20 0420  NA 137  K 4.3  CL 106  CO2 20*  GLUCOSE 285*  BUN 29*  CREATININE 1.77*  CALCIUM 9.1  AST 629*  ALT 899*  ALKPHOS 175*  BILITOT 5.7*   Cardiac Enzymes No results for input(s): TROPONINI in the last 168 hours. RADIOLOGY:  CT ABDOMEN PELVIS WO CONTRAST  Result Date: 11/14/2020 CLINICAL DATA:  Lung cancer with brain metastases, febrile with acute renal failure and sepsis. 70 year old male. EXAM: CT CHEST, ABDOMEN AND PELVIS WITHOUT CONTRAST TECHNIQUE: Multidetector CT imaging of the chest, abdomen and pelvis was performed following the standard protocol without IV contrast. COMPARISON:  August 19, 2020 FINDINGS: CT CHEST FINDINGS Cardiovascular: Heart size accentuated by low lung volumes likely mildly enlarged. Three-vessel coronary artery disease. No pericardial effusion. Aortic caliber is normal. Scattered atherosclerosis. Central pulmonary vasculature with normal caliber. Limited assessment of cardiovascular structures given lack of intravenous contrast. Mediastinum/Nodes: No mediastinal lymphadenopathy. Calcified lymph nodes in the chest are unchanged about the AP window. Small pre-vascular lymph node on image 22 unchanged at 6 mm. Lungs/Pleura: Interval development of ground-glass and septal thickening in the RIGHT chest, parenchymal assessment limited by respiratory motion. No lobar level consolidative changes. Airways are patent. No pleural effusion. LEFT hilar distortion in the setting of post treatment changes in the LEFT chest unchanged from previous imaging. Musculoskeletal: See below for full musculoskeletal details. No acute bone finding about the bony thorax. CT ABDOMEN PELVIS FINDINGS Hepatobiliary: Hepatic cysts  as before. Septation in the largest with subtle calcification unchanged. Largest measuring approximately 4  cm. Partially intrahepatic gallbladder without pericholecystic stranding. No biliary duct distension. Pancreas: Pancreas without contour abnormality. Subtle stranding may be present about the head of the pancreas versus motion artifact. No gross ductal dilation. Spleen: Spleen normal size and contour. Adrenals/Urinary Tract: Adrenal glands are normal. Renal cortical scarring bilaterally. No signs of frank hydronephrosis. Urinary bladder with smooth contours. Marked prostatomegaly is similar to the prior study with impression upon the bladder base Stomach/Bowel: Stomach is under distended. Mild gastric thickening suggested. Moderate-sized duodenal diverticulum without surrounding stranding. No sign of bowel obstruction or acute bowel process. Colonic diverticulosis. Vascular/Lymphatic: Aortic atheromatous plaque with calcification, no aneurysmal dilation of the abdominal aorta. There is no gastrohepatic or hepatoduodenal ligament lymphadenopathy. No retroperitoneal or mesenteric lymphadenopathy. No pelvic sidewall lymphadenopathy. Reproductive: Prostatomegaly as before. Other: No ascites.  No free air. Musculoskeletal: No acute bone finding. No destructive bone process. IMPRESSION: 1. Interval development of ground-glass and septal thickening in the RIGHT chest, parenchymal assessment limited by respiratory motion. Findings appear to be primarily related to atelectasis. Developing infection or even early viral or atypical process could also have this appearance. 2. Unchanged appearance of post treatment changes in the LEFT chest. 3. Subtle stranding may be present about the head of the pancreas versus motion artifact. Correlate with pancreatic enzymes. 4. Under distension versus mild gastric thickening. Could be related to gastritis 5. Marked prostatomegaly as before. 6. Three-vessel coronary artery disease. 7. Colonic diverticulosis without evidence of acute bowel process. 8. Hepatic cysts. 9. Aortic  atherosclerosis. Aortic Atherosclerosis (ICD10-I70.0). Electronically Signed   By: Zetta Bills M.D.   On: 11/14/2020 17:46   CT Head Wo Contrast  Result Date: 11/14/2020 CLINICAL DATA:  Delirium. Lung cancer with brain metastases. Lethargic and febrile with sepsis. EXAM: CT HEAD WITHOUT CONTRAST TECHNIQUE: Contiguous axial images were obtained from the base of the skull through the vertex without intravenous contrast. COMPARISON:  MRI 06/04/2020. FINDINGS: Brain: Hemorrhagic metastases in the left frontal lobe and anterior left temporal lobe. Comparison across modalities to the prior MRI is difficult, but there is suggestion of interval increase in size of these lesions which may relate to interval internal hemorrhage. There is also suggestion of increased vasogenic edema surrounding the left temporal metastasis. Additional metastases are not well characterized on this noncontrast head CT. Parieto-occipital encephalomalacia and white matter changes are grossly similar cross modalities. Vascular: Calcific atherosclerosis. No hyperdense vessel identified. Skull: No acute fracture. Sinuses/Orbits: Mild paranasal sinus mucosal thickening. Other: No mastoid effusions. IMPRESSION: 1. Comparison across modalities to the prior MRI is difficult, but there is suggestion of interval increase in size of hemorrhagic metastases in the left frontal lobe and left temporal lobe which may relate to interval internal hemorrhage. There is also suggestion of increased vasogenic edema surrounding the left temporal metastasis. An MRI with contrast could further characterize and also better evaluate for new metastases if clinically indicated. 2. Additional known metastases are poorly evaluated on this noncontrast head CT. 3. Grossly similar parieto-occipital encephalomalacia and white matter changes. Findings discussed with Dr. Jacqualine Code Via telephone at 5:26 PM. Electronically Signed   By: Margaretha Sheffield MD   On: 11/14/2020 17:30    CT Chest Wo Contrast  Result Date: 11/14/2020 CLINICAL DATA:  Lung cancer with brain metastases, febrile with acute renal failure and sepsis. 70 year old male. EXAM: CT CHEST, ABDOMEN AND PELVIS WITHOUT CONTRAST TECHNIQUE: Multidetector CT imaging of the chest, abdomen and pelvis  was performed following the standard protocol without IV contrast. COMPARISON:  August 19, 2020 FINDINGS: CT CHEST FINDINGS Cardiovascular: Heart size accentuated by low lung volumes likely mildly enlarged. Three-vessel coronary artery disease. No pericardial effusion. Aortic caliber is normal. Scattered atherosclerosis. Central pulmonary vasculature with normal caliber. Limited assessment of cardiovascular structures given lack of intravenous contrast. Mediastinum/Nodes: No mediastinal lymphadenopathy. Calcified lymph nodes in the chest are unchanged about the AP window. Small pre-vascular lymph node on image 22 unchanged at 6 mm. Lungs/Pleura: Interval development of ground-glass and septal thickening in the RIGHT chest, parenchymal assessment limited by respiratory motion. No lobar level consolidative changes. Airways are patent. No pleural effusion. LEFT hilar distortion in the setting of post treatment changes in the LEFT chest unchanged from previous imaging. Musculoskeletal: See below for full musculoskeletal details. No acute bone finding about the bony thorax. CT ABDOMEN PELVIS FINDINGS Hepatobiliary: Hepatic cysts as before. Septation in the largest with subtle calcification unchanged. Largest measuring approximately 4 cm. Partially intrahepatic gallbladder without pericholecystic stranding. No biliary duct distension. Pancreas: Pancreas without contour abnormality. Subtle stranding may be present about the head of the pancreas versus motion artifact. No gross ductal dilation. Spleen: Spleen normal size and contour. Adrenals/Urinary Tract: Adrenal glands are normal. Renal cortical scarring bilaterally. No signs of frank  hydronephrosis. Urinary bladder with smooth contours. Marked prostatomegaly is similar to the prior study with impression upon the bladder base Stomach/Bowel: Stomach is under distended. Mild gastric thickening suggested. Moderate-sized duodenal diverticulum without surrounding stranding. No sign of bowel obstruction or acute bowel process. Colonic diverticulosis. Vascular/Lymphatic: Aortic atheromatous plaque with calcification, no aneurysmal dilation of the abdominal aorta. There is no gastrohepatic or hepatoduodenal ligament lymphadenopathy. No retroperitoneal or mesenteric lymphadenopathy. No pelvic sidewall lymphadenopathy. Reproductive: Prostatomegaly as before. Other: No ascites.  No free air. Musculoskeletal: No acute bone finding. No destructive bone process. IMPRESSION: 1. Interval development of ground-glass and septal thickening in the RIGHT chest, parenchymal assessment limited by respiratory motion. Findings appear to be primarily related to atelectasis. Developing infection or even early viral or atypical process could also have this appearance. 2. Unchanged appearance of post treatment changes in the LEFT chest. 3. Subtle stranding may be present about the head of the pancreas versus motion artifact. Correlate with pancreatic enzymes. 4. Under distension versus mild gastric thickening. Could be related to gastritis 5. Marked prostatomegaly as before. 6. Three-vessel coronary artery disease. 7. Colonic diverticulosis without evidence of acute bowel process. 8. Hepatic cysts. 9. Aortic atherosclerosis. Aortic Atherosclerosis (ICD10-I70.0). Electronically Signed   By: Zetta Bills M.D.   On: 11/14/2020 17:46   MR BRAIN W WO CONTRAST  Result Date: 11/15/2020 CLINICAL DATA:  Encephalopathy EXAM: MRI HEAD WITHOUT AND WITH CONTRAST TECHNIQUE: Multiplanar, multiecho pulse sequences of the brain and surrounding structures were obtained without and with intravenous contrast. CONTRAST:  7.73mL GADAVIST  GADOBUTROL 1 MMOL/ML IV SOLN COMPARISON:  06/04/2020 FINDINGS: Brain: No acute infarct, mass effect or extra-axial collection. Multiple sites of chronic hemorrhage, greatest in the left frontal lobe and right parietal lobe. Large amount of abnormal T2-weighted signal within the white matter. There are old bilateral cerebellar infarcts. Contrast enhancing lesions are as follows: 1. Left temporal lobe 1.9 cm, previously 1.2 cm, series 21, image 11. Marked worsening of edema surrounding this lesion. 2. Left frontal lobe, 1.0 cm, previously 0.8 cm, image 15 3. Punctate midbrain lesion, unchanged, image 13 There are no new lesions identified. Vascular: Major flow voids are preserved. Skull and upper cervical spine: Normal  calvarium and skull base. Visualized upper cervical spine and soft tissues are normal. Sinuses/Orbits:Right mastoid effusion. Nasopharynx is clear. Left maxillary retention cyst. Normal orbits. IMPRESSION: 1. Increased size of left temporal lobe metastatic lesion with marked worsening of surrounding edema. 2. Increased size of left frontal metastasis. 3. Unchanged appearance of punctate midbrain lesion. No new lesions. 4. Old bilateral cerebellar infarcts and findings of chronic microvascular disease. Electronically Signed   By: Ulyses Jarred M.D.   On: 11/15/2020 00:00   US Abdomen Limited RUQ (LIVER/GB)  Result Date: 11/14/2020 CLINICAL DATA:  Right upper quadrant pain and elevated LFTs EXAM: ULTRASOUND ABDOMEN LIMITED RIGHT UPPER QUADRANT COMPARISON:  CT from earlier in the same day. FINDINGS: Gallbladder: Gallbladder is well distended without wall thickening. Multiple folds are noted similar to that seen on prior CT. No cholelithiasis is noted. Common bile duct: Diameter: 3.4 mm. Liver: Hepatic cysts are noted similar to that seen on prior CT examination. Some septation is noted similar to that noted on prior exam. No other focal abnormality is noted. Portal vein is patent on color Doppler  imaging with normal direction of blood flow towards the liver. Other: None. IMPRESSION: Paddock cysts similar to that seen on prior CT examination. No mass lesion is seen. Multiple folds within the gallbladder without complicating factors. Electronically Signed   By: Inez Catalina M.D.   On: 11/14/2020 20:02   ASSESSMENT AND PLAN:  AMROM ORE is a 70 y.o. 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  Acute metabolic encephalopathy Sepsis POA --Multifactorial from worsening hemorrhagic metastasis and increased vasogenic edema as well as sepsis from questionable pneumonia versus intra-abdominal infection --Neurosurgery Dr Rhea Bleacher input noted--  Recommend Keppra 500 BID for seizure prophylaxis and continue IV Decadron 4 mg Q ID for vasoedema --CT head shows changes in the size of hemorrhagic metastasis with increased vasogenic edema. -- MRI brain:Increased size of left temporal lobe metastatic lesion with marked worsening of surrounding edema. 2. Increased size of left frontal metastasis. 3. Unchanged appearance of punctate midbrain lesion. No new lesions. 4. Old bilateral cerebellar infarcts and findings of chronic microvascular disease. --no neuro deficits  Severe sepsis secondary to questionable pneumonia/probable hepatitis Acute on CKD-III --Patient presented with fever, tachycardia and elevated lactate of greater than 4--3.3--1.7 --BCID 4/4 [place] -ID consultation appreciated. OCnt Rocephin --CT chest showing groundglass appearance of the right lung which could be atelectasis versus infection-wife denies any respiratory symptoms but will go ahead and cover empirically with vancomycin and cefepime given that patient is ill-appearing --Also has significantly elevated  transaminitis, increased INR-concerning for acute hepatitis due to meds vs infection  Hx of stage IV adenocarcinoma of the lung with brain metastasis --Worsening Vasogenic edema and possible increase hemorrhage around metastasis --Management as above --Follows with Dr. Rogue Bussing --Consider  palliative consult in the morning- had extensive discussion with wife and pt regarding his CODE STATUS.  He remains full code.  Acute hypoxic respiratory failure --Admitted on 2 L.  Secondary to probable pneumonia --rx as above --wean oxygen to RA as able to  Transaminitis/supratherapeutic INR/Hyperbilirubunemia ?cholangitis --Continue management as above --LFt's trending down  --US abdomenPaddock cysts similar to that seen on prior CT examination. No mass lesion is seen. Multiple folds within the gallbladder without complicating factors.  Type 2 diabetes with hyperglycemia --Hemoglobin A1c  of 10.5 on 2/24 --Placed on moderate sliding scale, can uptitrate depending on hyperglycemia while on steroids  History of DVT/PE --Hold Xarelto given possible worsening hemorrhagic metastasis of the brain for atleast 1 week per Neuro sx recs  Acute on CKD stage III/pre-renal in the setting of sepsis --Creatinine of 2.18--1.77 --baseline creat  1.6 Follow with repeat labs in the morning  DVT prophylaxis:.SCD Code Status: Full Family Communication:  wife at bedside  disposition Plan: Home with at least 2 midnight stays  Consults called: Oncology, neurosx Admission status: inpatient  Status is: Inpatient  Remains inpatient appropriate because:Inpatient level of care appropriate due to severity of illness   Dispo: The patient is from: Home  Anticipated d/c is to: Home  Patient currently is not medically stable to d/c.              Difficult to place patient No Level of care: Med-Surg        TOTAL TIME TAKING CARE OF THIS PATIENT: *25* minutes.  >50%  time spent on counselling and coordination of care  Note: This dictation was prepared with Dragon dictation along with smaller phrase technology. Any transcriptional errors that result from this process are unintentional.  Fritzi Mandes M.D    [facility]   CC: Primary care physician; [name], MDPatient ID: Melvin Willis, male   DOB: 05-13-1951, 69 y.o.   MRN: [id number]

## 2020-11-17 DIAGNOSIS — A419 Sepsis, unspecified organism: Secondary | ICD-10-CM | POA: Diagnosis not present

## 2020-11-17 DIAGNOSIS — C7931 Secondary malignant neoplasm of brain: Secondary | ICD-10-CM | POA: Diagnosis not present

## 2020-11-17 DIAGNOSIS — G9341 Metabolic encephalopathy: Secondary | ICD-10-CM | POA: Diagnosis not present

## 2020-11-17 DIAGNOSIS — R7401 Elevation of levels of liver transaminase levels: Secondary | ICD-10-CM | POA: Diagnosis not present

## 2020-11-17 LAB — CULTURE, BLOOD (ROUTINE X 2)
Special Requests: ADEQUATE
Special Requests: ADEQUATE

## 2020-11-17 LAB — GLUCOSE, CAPILLARY
Glucose-Capillary: 255 mg/dL — ABNORMAL HIGH (ref 70–99)
Glucose-Capillary: 281 mg/dL — ABNORMAL HIGH (ref 70–99)
Glucose-Capillary: 279 mg/dL — ABNORMAL HIGH (ref 70–99)
Glucose-Capillary: 258 mg/dL — ABNORMAL HIGH (ref 70–99)

## 2020-11-17 LAB — HEPATIC FUNCTION PANEL
ALT: 383 U/L — ABNORMAL HIGH (ref 0–44)
AST: 64 U/L — ABNORMAL HIGH (ref 15–41)
Albumin: 3.4 g/dL — ABNORMAL LOW (ref 3.5–5.0)
Alkaline Phosphatase: 132 U/L — ABNORMAL HIGH (ref 38–126)
Bilirubin, Direct: 0.3 mg/dL — ABNORMAL HIGH (ref 0.0–0.2)
Indirect Bilirubin: 0.9 mg/dL (ref 0.3–0.9)
Total Bilirubin: 1.2 mg/dL (ref 0.3–1.2)
Total Protein: 7.1 g/dL (ref 6.5–8.1)

## 2020-11-17 MED ORDER — INSULIN GLARGINE 100 UNIT/ML ~~LOC~~ SOLN
20.0000 [IU] | Freq: Every day | SUBCUTANEOUS | Status: DC
  Administered 2020-11-17 – 2020-11-18 (×2): 20 [IU] via SUBCUTANEOUS
  Filled 2020-11-17 (×3): qty 0.2

## 2020-11-17 MED ORDER — DEXAMETHASONE SODIUM PHOSPHATE 4 MG/ML IJ SOLN
4.0000 mg | Freq: Three times a day (TID) | INTRAMUSCULAR | Status: AC
  Administered 2020-11-18 – 2020-11-19 (×6): 4 mg via INTRAVENOUS
  Filled 2020-11-17 (×6): qty 1

## 2020-11-17 MED ORDER — TAMSULOSIN HCL 0.4 MG PO CAPS
0.4000 mg | ORAL_CAPSULE | Freq: Every day | ORAL | Status: DC
  Administered 2020-11-18 – 2020-11-20 (×3): 0.4 mg via ORAL
  Filled 2020-11-17 (×3): qty 1

## 2020-11-17 MED ORDER — DEXAMETHASONE 4 MG PO TABS: 2.0000 mg | ORAL_TABLET | Freq: Every day | ORAL | Status: DC

## 2020-11-17 MED ORDER — FINASTERIDE 5 MG PO TABS
5.0000 mg | ORAL_TABLET | Freq: Every day | ORAL | Status: DC
  Administered 2020-11-17 – 2020-11-20 (×4): 5 mg via ORAL
  Filled 2020-11-17 (×4): qty 1

## 2020-11-17 MED ORDER — DEXAMETHASONE 4 MG PO TABS
2.0000 mg | ORAL_TABLET | Freq: Three times a day (TID) | ORAL | Status: DC
  Administered 2020-11-20 (×2): 2 mg via ORAL
  Filled 2020-11-17 (×2): qty 1

## 2020-11-17 NOTE — Plan of Care (Signed)
No acute changes this shift. Patient is resting in bed at 30 degrees, call bell within reach, no acute distress noted. Continue to monitor through the remainder of this writer's scheduled shift

## 2020-11-17 NOTE — Progress Notes (Signed)
Melvin Willis NAME: Melvin Willis    MR#:  [id number]  DATE OF BIRTH:  [birth year]  SUBJECTIVE:  more awake and eating breakfast. Answers most questions appropriately. No other issues per RN.  REVIEW OF SYSTEMS:   Review of Systems  Constitutional: Positive for malaise/fatigue. Negative for chills, fever and weight loss.  HENT: Negative for ear discharge, ear pain and nosebleeds.   Eyes: Negative for blurred vision, pain and discharge.  Respiratory: Negative for sputum production, shortness of breath, wheezing and stridor.   Cardiovascular: Negative for chest pain, palpitations, orthopnea and PND.  Gastrointestinal: Negative for abdominal pain, diarrhea, nausea and vomiting.  Genitourinary: Negative for frequency and urgency.  Musculoskeletal: Negative for back pain and joint pain.  Neurological: Positive for weakness. Negative for sensory change, speech change and focal weakness.  Psychiatric/Behavioral: Negative for depression and hallucinations. The patient is not nervous/anxious.    Tolerating Diet:yes Tolerating PT:   DRUG ALLERGIES:  No Known Allergies  VITALS:  Blood pressure (!) 141/83, pulse 64, temperature 97.6 F (36.4 C), resp. rate 18, height 5\' 4"  (1.626 m), weight 75.8 kg, SpO2 97 %.  PHYSICAL EXAMINATION:   Physical Exam  GENERAL:  70 y.o.-year-old patient lying in the bed with no acute distress. Appears ill and fatigue LUNGS: Normal breath sounds bilaterally, no wheezing, rales, rhonchi. No use of accessory muscles of respiration.  CARDIOVASCULAR: S1, S2 normal. No murmurs, rubs, or gallops.  ABDOMEN: Soft, nontender, nondistended. Bowel sounds present. No organomegaly or mass.  EXTREMITIES: No cyanosis, clubbing or edema b/l.    NEUROLOGIC: Cranial nerves II through XII are intact. No focal Motor or sensory deficits b/l.   PSYCHIATRIC:  patient is alert and oriented x 3.  SKIN: No obvious rash, lesion,  or ulcer.   LABORATORY PANEL:  CBC Recent Labs  Lab 11/15/20 0420  WBC 9.8  HGB 14.4  HCT 42.9  PLT 184    Chemistries  Recent Labs  Lab 11/15/20 0420  NA 137  K 4.3  CL 106  CO2 20*  GLUCOSE 285*  BUN 29*  CREATININE 1.77*  CALCIUM 9.1  AST 629*  ALT 899*  ALKPHOS 175*  BILITOT 5.7*   Cardiac Enzymes No results for input(s): TROPONINI in the last 168 hours. RADIOLOGY:  No results found. ASSESSMENT AND PLAN:  Melvin Willis is a 70 y.o. 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  Acute metabolic encephalopathy Sepsis POA --Multifactorial from worsening hemorrhagic metastasis and increased vasogenic edema as well as sepsis from questionable pneumonia versus intra-abdominal infection --Neurosurgery Dr Rhea Bleacher input noted--  Recommend Keppra 500 BID for seizure prophylaxis and continue IV Decadron 4 mg Q ID for vasoedema with tapering dose placed by pharmacy --CT head shows changes in the size of hemorrhagic metastasis with increased vasogenic edema. -- MRI brain:Increased size of left temporal lobe metastatic lesion with marked worsening of surrounding edema. 2. Increased size of left frontal metastasis. 3. Unchanged appearance of punctate midbrain lesion. No new lesions. 4. Old bilateral cerebellar infarcts and findings of chronic microvascular disease. --no neuro deficits  Severe sepsis secondary to questionable pneumonia/probable hepatitis Acute on CKD-III --Patient presented with fever, tachycardia and elevated lactate of greater than 4--3.3--1.7 --BCID 4/4 [place] -ID consultation appreciated. OCnt Rocephin --CT chest  showing groundglass appearance of the right lung which could be atelectasis versus infection-wife denies any respiratory symptoms  but will go ahead and cover empirically with vancomycin and cefepime given that patient is ill-appearing --Also has significantly elevated transaminitis, increased INR-concerning for acute hepatitis due to meds vs infection --3/13--no fever. Will repeat BC  Hx of stage IV adenocarcinoma of the lung with brain metastasis --Worsening Vasogenic edema and possible increase hemorrhage around metastasis --Management as above --Follows with Dr. Rogue Bussing --Consider  palliative consult in the morning- had extensive discussion with wife and pt regarding his CODE STATUS.  He remains full code.  Acute hypoxic respiratory failure--resolved --Admitted on 2 L.  Secondary to probable pneumonia --rx as above --weaned oxygen to RA   Transaminitis/supratherapeutic INR/Hyperbilirubunemia ?cholangitis --Continue management as above --LFt's trending down  --US abdomen shows  cysts similar to that seen on prior CT examination. No mass lesion is seen. Multiple folds within the gallbladder without complicating factors.  Type 2 diabetes with hyperglycemia --Hemoglobin A1c of 10.5 on 2/24 --Placed on moderate sliding scale, can uptitrate depending on hyperglycemia while on steroids  History of DVT/PE --Hold Xarelto given possible worsening hemorrhagic metastasis of the brain for atleast 1 week per Neuro sx recs  Acute on CKD stage III/pre-renal in the setting of sepsis --Creatinine of 2.18--1.77 --baseline creat  1.6   DVT prophylaxis:.SCD Code Status: Full Family Communication:  wife at bedside yday disposition Plan: Home with at least 2 midnight stays  Consults called: Oncology, neurosx, ID Admission status: inpatient  Status is: Inpatient  Remains inpatient appropriate because:Inpatient level of care appropriate due to severity of illness currently on IV abxs. Need to determine duration and dosage  Dispo: The patient is from: Home  Anticipated d/c is to: Home in 1-2  days  Patient currently is not medically stable to d/c.              Difficult to place patient No Level of care: Med-Surg        TOTAL TIME TAKING CARE OF THIS PATIENT: *25* minutes.  >50% time spent on counselling and coordination of care  Note: This dictation was prepared with Dragon dictation along with smaller phrase technology. Any transcriptional errors that result from this process are unintentional.  Fritzi Mandes M.D    [facility]   CC: Primary care physician; [name], MDPatient ID: Melvin Willis, male   DOB: 21-Jul-1951, 70 y.o.   MRN: [id number]

## 2020-11-18 DIAGNOSIS — R7881 Bacteremia: Secondary | ICD-10-CM | POA: Diagnosis not present

## 2020-11-18 DIAGNOSIS — G9340 Encephalopathy, unspecified: Secondary | ICD-10-CM | POA: Diagnosis not present

## 2020-11-18 DIAGNOSIS — G9341 Metabolic encephalopathy: Secondary | ICD-10-CM | POA: Diagnosis not present

## 2020-11-18 DIAGNOSIS — N179 Acute kidney failure, unspecified: Secondary | ICD-10-CM | POA: Diagnosis not present

## 2020-11-18 DIAGNOSIS — Z515 Encounter for palliative care: Secondary | ICD-10-CM | POA: Diagnosis not present

## 2020-11-18 DIAGNOSIS — R6521 Severe sepsis with septic shock: Secondary | ICD-10-CM

## 2020-11-18 DIAGNOSIS — J9601 Acute respiratory failure with hypoxia: Secondary | ICD-10-CM | POA: Diagnosis not present

## 2020-11-18 DIAGNOSIS — A419 Sepsis, unspecified organism: Secondary | ICD-10-CM | POA: Diagnosis not present

## 2020-11-18 DIAGNOSIS — C7931 Secondary malignant neoplasm of brain: Secondary | ICD-10-CM | POA: Diagnosis not present

## 2020-11-18 DIAGNOSIS — B961 Klebsiella pneumoniae [K. pneumoniae] as the cause of diseases classified elsewhere: Secondary | ICD-10-CM | POA: Diagnosis not present

## 2020-11-18 DIAGNOSIS — C3412 Malignant neoplasm of upper lobe, left bronchus or lung: Secondary | ICD-10-CM | POA: Diagnosis not present

## 2020-11-18 LAB — GLUCOSE, CAPILLARY
Glucose-Capillary: 216 mg/dL — ABNORMAL HIGH (ref 70–99)
Glucose-Capillary: 281 mg/dL — ABNORMAL HIGH (ref 70–99)
Glucose-Capillary: 371 mg/dL — ABNORMAL HIGH (ref 70–99)
Glucose-Capillary: 306 mg/dL — ABNORMAL HIGH (ref 70–99)

## 2020-11-18 MED ORDER — CEFAZOLIN SODIUM-DEXTROSE 2-4 GM/100ML-% IV SOLN
2.0000 g | Freq: Three times a day (TID) | INTRAVENOUS | Status: DC
  Administered 2020-11-19: 04:00:00 2 g via INTRAVENOUS
  Filled 2020-11-18 (×3): qty 100

## 2020-11-18 NOTE — Progress Notes (Signed)
Date of Admission:  11/14/2020      ID: Melvin Willis is a 70 y.o. male  Active Problems:   Type 2 diabetes mellitus with stage 3 chronic kidney disease ([place])   Primary cancer of left upper lobe of lung ([place])   Brain metastasis ([place])   Sepsis ([place])   Septic shock ([place])   Acute metabolic encephalopathy   Acute respiratory failure ([facility])   Transaminitis   Supratherapeutic INR   Acute-on-chronic kidney injury ([place])    Subjective: Patient says he is doing better Appetite better No fever   Medications:  . dexamethasone (DECADRON) injection  4 mg Intravenous Q8H   Followed by  . [START ON 11/20/2020] dexamethasone  2 mg Oral Q8H   Followed by  . [START ON 11/22/2020] dexamethasone  2 mg Oral Daily  . finasteride  5 mg Oral Daily  . gabapentin  300 mg Oral QHS  . insulin aspart  0-15 Units Subcutaneous TID WC  . insulin aspart  0-5 Units Subcutaneous QHS  . insulin glargine  20 Units Subcutaneous Daily  . tamsulosin  0.4 mg Oral QPC breakfast    Objective: Vital signs in last 24 hours: Patient Vitals for the past 24 hrs:  BP Temp Temp src Pulse Resp SpO2  11/18/20 1613 134/85 (!) 97.4 F (36.3 C) Oral 67 16 95 %  11/18/20 1210 116/78 (!) 97.5 F (36.4 C) Oral 77 20 96 %  11/18/20 0748 (!) 136/91 98 F (36.7 C) Oral 62 17 93 %  11/18/20 0429 123/85 97.7 F (36.5 C) Axillary (!) 56 16 96 %  11/18/20 0051 128/87 98.4 F (36.9 C) Oral 77 15 93 %  11/17/20 2038 (!) 127/93 97.7 F (36.5 C) Oral 71 16 92 %   PHYSICAL EXAM:  General: Alert, cooperative, no distress, appears stated age.  ? strabismus Lungs: Clear to auscultation bilaterally. No Wheezing or Rhonchi. No rales. Heart: Regular rate and rhythm, no murmur, rub or gallop. Abdomen: Soft, non-tender,not distended. Bowel sounds normal. No masses Extremities: atraumatic, no cyanosis. No edema. No clubbing Skin: No rashes or lesions. Or bruising Lymph: Cervical, supraclavicular normal. Neurologic: ? Weakness  left arm  CBC Latest Ref Rng & Units 11/15/2020 11/14/2020 10/07/2020  WBC 4.0 - 10.5 K/uL 9.8 8.6 5.1  Hemoglobin 13.0 - 17.0 g/dL 14.4 14.9 14.3  Hematocrit 39.0 - 52.0 % 42.9 44.0 41.9  Platelets 150 - 400 K/uL 184 205 189    CMP Latest Ref Rng & Units 11/17/2020 11/15/2020 11/14/2020  Glucose 70 - 99 mg/dL - 285(H) 252(H)  BUN 8 - 23 mg/dL - 29(H) 31(H)  Creatinine 0.61 - 1.24 mg/dL - 1.77(H) 2.18(H)  Sodium 135 - 145 mmol/L - 137 135  Potassium 3.5 - 5.1 mmol/L - 4.3 5.0  Chloride 98 - 111 mmol/L - 106 101  CO2 22 - 32 mmol/L - 20(L) 21(L)  Calcium 8.9 - 10.3 mg/dL - 9.1 9.5  Total Protein 6.5 - 8.1 g/dL 7.1 7.4 7.8  Total Bilirubin 0.3 - 1.2 mg/dL 1.2 5.7(H) 4.5(H)  Alkaline Phos 38 - 126 U/L 132(H) 175(H) 211(H)  AST 15 - 41 U/L 64(H) 629(H) 1,838(H)  ALT 0 - 44 U/L 383(H) 899(H) 1,358(H)   microbiology: Blood culture 11/14/2020 Klebsiella pneumonia 11/17/2020 no growth 11/14/2020 urine culture: Klebsiella pneumoniae  Studies/Results: No results found.   Assessment/Plan: Encephalopathy.  Likely due to brain mets and infection.  better  Klebsiella pneumonia bacteremia.  Unclear source.  Urine culture negative but has  prostatomegaly  Ct abdomen shows duodenal diverticulum. Could that be a source ? Rt lung patchy infilltrate ? Liver /GB source because of abnormal LFTS  Patient currently on ceftriaxone.  We will switch to cefazolin.  .  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   We will be getting radiation.  AKI on CKD improving  Acute hepatitis with increased bilirubin, transaminitis and alkaline phosphatase.  Improving  Discussed the management with patient and his wife.

## 2020-11-18 NOTE — Progress Notes (Signed)
Melvin Willis   DOB:1951-04-01   SE#:[id number]    Subjective: No acute events overnight.  No nausea no vomiting.  No fevers or chills.  Objective:  Vitals:   11/18/20 1613 11/18/20 2021  BP: 134/85 132/81  Pulse: 67 (!) 56  Resp: 16 16  Temp: (!) 97.4 F (36.3 C) (!) 97.5 F (36.4 C)  SpO2: 95% 96%     Intake/Output Summary (Last 24 hours) at 11/18/2020 2043 Last data filed at 11/18/2020 1852 Gross per 24 hour  Intake 640 ml  Output 575 ml  Net 65 ml    Physical Exam Constitutional:      Comments: Pt sitting in the bed.   HENT:     Head: Normocephalic and atraumatic.     Mouth/Throat:     Pharynx: No oropharyngeal exudate.  Eyes:     Pupils: Pupils are equal, round, and reactive to light.  Cardiovascular:     Rate and Rhythm: Normal rate and regular rhythm.  Pulmonary:     Effort: Pulmonary effort is normal. No respiratory distress.     Breath sounds: Normal breath sounds. No wheezing.  Abdominal:     General: Bowel sounds are normal. There is no distension.     Palpations: Abdomen is soft. There is no mass.     Tenderness: There is no abdominal tenderness. There is no guarding or rebound.  Musculoskeletal:        General: No tenderness. Normal range of motion.     Cervical back: Normal range of motion and neck supple.  Skin:    General: Skin is warm.  Neurological:     Mental Status: He is alert and oriented to person, place, and time.  Psychiatric:        Mood and Affect: Affect normal.     Labs:  Lab Results  Component Value Date   WBC 9.8 11/15/2020   HGB 14.4 11/15/2020   HCT 42.9 11/15/2020   MCV 88.3 11/15/2020   PLT 184 11/15/2020   NEUTROABS 7.6 11/14/2020    Lab Results  Component Value Date   NA 137 11/15/2020   K 4.3 11/15/2020   CL 106 11/15/2020   CO2 20 (L) 11/15/2020    Studies:  No results found.  Primary cancer of left upper lobe of lung ([place]) #70 year old male patient with a history of metastatic lung cancer to brain  is currently admitted to hospital for mental status changes  #Mental status changes-likely secondary to progressive brain metastases; CT/MRI concerning for progressive disease with edema-currently on dexamethasone 4 mg 3 times daily IV; discussed with Dr. Donella Stade; plan for SBRT.  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  CT chest and pelvis scan overall stable disease.  HOLD entrectinib- given the elevated LFTs.  Patient will likely need to go on lorlatinib.   #Elevated LFTs-unclear etiology noted for obstruction noted. ? Entretinib vs others hypotension etc. no obvious evidence of metastatic disease noted-improving.HOLD entrectinib.   #Positive blood culture-Klebsiella pneumonia-on ceftriaxone.  Repeat cultures negative.  Appreciate ID recommendations.  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  Hold Xarelto given the brain hemorrhagic transformation.   # acute renal failure on CKD stage III-creatinine 2.1- likely pre-renal. Monitor closely on IVFs.  #Discussed with [employer].  Also discussed with patient's daughter Melvin Willis, regarding the overall poor prognosis-and limited life expectancy if patient continues to have progressive disease in the brain.   Melvin Willis  Melvin Lions, MD 11/18/2020  8:43 PM

## 2020-11-18 NOTE — Evaluation (Signed)
Physical Therapy Evaluation Patient Details Name: Melvin Willis MRN: [id number] DOB: 1951/06/25 Today's Date: 11/18/2020   History of Present Illness  Melvin Willis is a 70 y.o. 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    Clinical Impression  Patient received in bed sleeping. Rouses to touch. Wife present in room. Patient is lethargic. Patient is mod independent with bed mobility, increased time and use of rails. Patient transfers sit to stand with min assist, and is able to pivot to bsc and back to bed with min assist. Slow, shuffle steps. Patient will continue to benefit from skilled PT while here to improve strength, safety and functional independence.        Follow Up Recommendations Home health PT;Supervision for mobility/OOB    Equipment Recommendations  Rolling walker with 5" wheels;Wheelchair (measurements PT);Wheelchair cushion (measurements PT);3in1 (PT);Hospital bed    Recommendations for Other Services OT consult     Precautions / Restrictions Precautions Precautions: Fall Restrictions Weight Bearing Restrictions: No      Mobility  Bed Mobility Overal bed mobility: Modified Independent             General bed mobility comments: cues needed    Transfers Overall transfer level: Needs assistance Equipment used: None Transfers: Sit to/from [facility] Sit to Stand: Min assist Stand pivot transfers: Min assist          Ambulation/Gait Ambulation/Gait assistance: Min assist Gait Distance (Feet): 3 Feet Assistive device: 1 person hand held assist Gait Pattern/deviations: Step-to pattern;Decreased step length - right;Decreased step length - left;Shuffle Gait velocity: decreased   General Gait Details: patient able to pivot  feet around to [facility] with min assist. B UE support during transfer. Increased time needed.  Stairs            Wheelchair Mobility    Modified Rankin (Stroke Patients Only)       Balance Overall balance assessment: Needs assistance Sitting-balance support: Feet supported Sitting balance-Leahy Scale: Good     Standing balance support: Bilateral upper extremity supported;During functional activity Standing balance-Leahy Scale: Poor Standing balance comment: patient is reliant on B UE support at this time due to weakness                             Pertinent Vitals/Pain Pain Assessment: No/denies pain    Home Living Family/patient expects to be discharged to:: Private residence Living Arrangements: Spouse/significant other Available Help at Discharge: Family;Available PRN/intermittently Type of Home: House Home Access: Stairs to enter Entrance Stairs-Rails: Right Entrance Stairs-Number of Steps: 2 Home Layout: One level Home Equipment: None      Prior Function Level of Independence: Independent               Hand Dominance        Extremity/Trunk Assessment   Upper Extremity Assessment Upper Extremity Assessment: Generalized weakness    Lower Extremity Assessment Lower Extremity Assessment: Generalized weakness    Cervical / Trunk Assessment Cervical / Trunk Assessment: Normal  Communication   Communication: No difficulties  Cognition Arousal/Alertness: Lethargic Behavior During Therapy: WFL for tasks assessed/performed;Flat affect Overall Cognitive Status: Within Functional Limits for tasks assessed  General Comments: patient slow to respond      General Comments      Exercises     Assessment/Plan    PT Assessment Patient needs continued PT services  PT Problem List Decreased strength;Decreased mobility;Decreased activity tolerance;Decreased balance       PT Treatment  Interventions Therapeutic exercise;Balance training;Stair training;Gait training;Functional mobility training;Therapeutic activities;Patient/family education    PT Goals (Current goals can be found in the Care Plan section)  Acute Rehab PT Goals Patient Stated Goal: to get stronger, go home PT Goal Formulation: With patient/family Time For Goal Achievement: 11/25/20 Potential to Achieve Goals: Fair    Frequency Min 2X/week   Barriers to discharge        Co-evaluation               AM-PAC PT "6 Clicks" Mobility  Outcome Measure Help needed turning from your back to your side while in a flat bed without using bedrails?: A Little Help needed moving from lying on your back to sitting on the side of a flat bed without using bedrails?: A Little Help needed moving to and from a bed to a chair (including a wheelchair)?: A Little Help needed standing up from a chair using your arms (e.g., wheelchair or bedside chair)?: A Little Help needed to walk in hospital room?: A Lot Help needed climbing 3-5 steps with a railing? : A Lot 6 Click Score: 16    End of Session   Activity Tolerance: Patient limited by fatigue Patient left: in bed;with call bell/phone within reach;with family/visitor present Nurse Communication: Mobility status PT Visit Diagnosis: Unsteadiness on feet (R26.81);Other abnormalities of gait and mobility (R26.89);Muscle weakness (generalized) (M62.81);Difficulty in walking, not elsewhere classified (R26.2)    Time: 0370-4888 PT Time Calculation (min) (ACUTE ONLY): 29 min   Charges:   PT Evaluation $PT Eval Moderate Complexity: 1 Mod PT Treatments $Therapeutic Activity: 8-22 mins        Alexas Basulto, PT, GCS 11/18/20,3:50 PM

## 2020-11-18 NOTE — Progress Notes (Signed)
Radiation Oncology Follow up Note  Name: Melvin Willis   Date:   11/14/2020 MRN:  [id number] DOB: 1951/06/12    This 70 y.o. male seen in house for consideration of further palliative radiation therapy to his brain for increasing left temporal lobe metastasis.  REFERRING PROVIDER: No ref. provider found  HPI: Patient is a 70 year old male previous treated to his chest back in 2008 for 6 adenocarcinoma lung also treated back in August with whole brain radiation therapy for stage IV adenocarcinoma.Marland Kitchen  He was recently admitted for sepsis?  Pneumonia.  He also had an MRI scan worsening hemorrhagic metastasis of the left temporal lobe with increased vasogenic edema.  Currently is on IV Decadron which is improved his overall affect.  MRI also showed stable changes of punctate midbrain lesions.  His most recent CT scans of his chest this month showed development of groundglass and septal thickening the right chest findings apparently related to atelectasis.  His left chest showed stable changes.  I believe that asked to evaluate the patient for post radiation to the increasing left temporal mass.  He has been declined by neurosurgery for surgical resection.  He is seen today in his hospital room accompanied by his wife.  He is out of bed coherent.  COMPLICATIONS OF TREATMENT: none  FOLLOW UP COMPLIANCE: keeps appointments   PHYSICAL EXAM:  BP 116/78 (BP Location: Right Arm)   Pulse 77   Temp (!) 97.5 F (36.4 C) (Oral)   Resp 20   Ht 5\' 4"  (1.626 m)   Wt 167 lb 1.7 oz (75.8 kg)   SpO2 96%   BMI 28.68 kg/m  No focal neurologic deficits are appreciated crude visual fields are within normal range motor sensory and DTR levels are equal symmetric in the upper lower extremities proprioception is intact.  Well-developed well-nourished patient in NAD. HEENT reveals PERLA, EOMI, discs not visualized.  Oral cavity is clear. No oral mucosal lesions are identified. Neck is clear without evidence of  cervical or supraclavicular adenopathy. Lungs are clear to A&P. Cardiac examination is essentially unremarkable with regular rate and rhythm without murmur rub or thrill. Abdomen is benign with no organomegaly or masses noted. Motor sensory and DTR levels are equal and symmetric in the upper and lower extremities. Cranial nerves II through XII are grossly intact. Proprioception is intact. No peripheral adenopathy or edema is identified. No motor or sensory levels are noted. Crude visual fields are within normal range.  RADIOLOGY RESULTS: CT scan of the chest and MRI scan of brain reviewed compatible with above-stated findings  PLAN: At this time I to go ahead with another 20 Gray to the left temporal mass.  Risks and benefits of treatment occluding possible hair loss fatigue alteration of blood counts skin reaction were reviewed with the patient.  I personally set up and ordered CT simulation with MRI fusion study tomorrow.  We will plan on starting his treatments first thing next week and plan on 4 fractions of 50 Gray.  Patient wife both comprehend my recommendations well.  I would like to take this opportunity to thank you for allowing me to participate in the care of your patient.Noreene Filbert, MD

## 2020-11-18 NOTE — Consult Note (Signed)
[place]  [phone] [phone]   Name: Melvin Willis Date: 11/18/2020 MRN: [id number]  DOB: 12-24-1950  Patient Care Team: [name], [name], [name], [name], [facility] (Pharmacist)    REASON FOR CONSULTATION: Melvin Willis is a 70 y.o. 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  Patient is status post whole brain radiation and Avastin most recently on treatment with entrectinib.  MRI of the brain on 07/25/2019 revealed progression of enhancing lesion in the left temporal lobe concerning for either radiation necrosis or progressive metastatic disease.    Patient was admitted to hospital 11/15/2020 with altered mental status.  CT of the brain revealed hemorrhagic metastasis with increased vasogenic edema.  MRI of the brain also showed progressive brain metastases.  CT of the chest revealed groundglass opacities in the right chest concerning for atelectasis versus early infection.  Patient was treated with sepsis with Klebsiella bacteremia.  Patient was referred to palliative care to help address goals and manage ongoing symptoms..   SOCIAL HISTORY:     reports that he has never smoked. He has never used smokeless tobacco. He reports that he does not drink alcohol and does not use drugs.  Patient is married and lives at home with his wife.  He has 2 sons and a daughter who are involved in his care.  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  ADVANCE DIRECTIVES:  Does not have  CODE STATUS: Full code  PAST MEDICAL HISTORY: Past Medical History:  Diagnosis Date  . Abnormal prostate specific antigen 08/08/2012  . Adiposity  04/16/2015  . Chronic kidney disease (CKD), stage III (moderate) ([place]) 11/27/2016  . CVA (cerebral vascular accident) ([place]) 06/17/2016  . Diabetes mellitus without complication ([place])   . Diverticulosis of sigmoid colon 04/16/2015  . Dyslipidemia 03/18/2015  . Hemorrhoids, internal 04/16/2015  . Hypercholesteremia 04/16/2015  . Hyperlipidemia   . Hypertension   . Primary cancer of left upper lobe of lung ([place])   . Pulmonary embolism ([place])   . Wears dentures    partial upper    PAST SURGICAL HISTORY:  Past Surgical History:  Procedure Laterality Date  . COLONOSCOPY    . COLONOSCOPY WITH PROPOFOL N/A 05/06/2015   Procedure: COLONOSCOPY WITH PROPOFOL;  Surgeon: Lucilla Lame, MD;  Location: [place];  Service: Endoscopy;  Laterality: N/A;  ASCENDING COLON POLYPS X 2 TERMINAL ILEUM BIOPSY RANDOM COLON BX. TRANSVERSE COLON POLYP SIGMOID COLON POLYP  . ESOPHAGOGASTRODUODENOSCOPY (EGD) WITH PROPOFOL N/A 05/06/2015   Procedure: ESOPHAGOGASTRODUODENOSCOPY (EGD) WITH PROPOFOL;  Surgeon: Lucilla Lame, MD;  Location: [place];  Service: Endoscopy;  Laterality: N/A;  GASTRIC BIOPSY X1  . KIDNEY STONE SURGERY  2017    HEMATOLOGY/ONCOLOGY HISTORY:  Oncology History Overview Note  # OCT 2017- ADENO CA LUNG; STAGE IV [; LUL; bil supraclavicular LN; Left neck LN Bx]; ROS-1 MUTATED; s/p Carbo-alimta x1[oct 2017]  # NOV 1st 2017- XALKORI 250 mg BID; JAN 15th CT- PR;  # AUG 27th Chemo-RT to persistent LUL/mediastinal LN [s/p carbo-taxol- with RT; finished Sep 22nd 2018];   # July 14th 2020- multiple metastatic lesions of the brain [July 30 whole brain radiation finished April 21, 2019]; bone scan skull metastases /posterior left  ninth rib metastases; April 04, 2019 stopped crizotinib;  #April 25, 2019-start Entrectinib 600 mg once a day; STopped in NOV 2020 [dizziness]; NOV 2020-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   # LLE DVT/bil PE/Multiple strokes [? On xarelto]-Lovenox; Jan mid 2018- xarelto [lovenox-insurance issues]  # MRI brain- multiple infarcts [2d echo/bubble study-NEG's/p Neurology eval]  ------------------------------------------------------------    # # s/p TURP [Sep 2017; Dr.Cope] DEC 26th CT- distended bladder;  # Elevated PSA-  FEB 2021- 12;  S/p Bx- Negative; follow with urology/Dr.Stoioff   # MOLECULAR TESTING- ROS-1 POSITIVE; ALK/EGFR-NEG; PDL-1 EXPRESSION- 90%** [HIGH]  # DEC 15th 2020- PALLIATIVE CARE -----------------------------------------------------------------    DIAGNOSIS: Adenocarcinoma the lung  ROS-1 +  STAGE:  IV    ;GOALS: Palliative  CURRENT/MOST RECENT THERAPY- Avastin [C]   Primary cancer of left upper lobe of lung ([place])  08/09/2019 -  Chemotherapy   The patient had bevacizumab-awwb (MVASI) 800 mg in sodium chloride 0.9 % 100 mL chemo infusion, 725 mg, Intravenous,  Once, 6 of 6 cycles Dose modification: 10 mg/kg (original dose 10 mg/kg, Cycle 3, Reason: Other (see comments), Comment: weight change) Administration: 800 mg (08/09/2019), 800 mg (08/22/2019), 700 mg (09/19/2019), 700 mg (10/04/2019), 700 mg (10/18/2019), 700 mg (11/01/2019)  for chemotherapy treatment.      ALLERGIES:  has No Known Allergies.  MEDICATIONS:  Current Facility-Administered Medications  Medication Dose Route Frequency Provider Last Rate Last Admin  . cefTRIAXone (ROCEPHIN) 2 g in sodium chloride 0.9 % 100 mL IVPB  2 g Intravenous Q24H Sharion Settler, NP 200 mL/hr at 11/18/20 0442 2 g at 11/18/20 0442  . dexamethasone (DECADRON) injection 4 mg  4 mg Intravenous Q8H Fritzi Mandes, MD   4 mg at 11/18/20 0949   Followed by  . [START ON 11/20/2020] dexamethasone (DECADRON) tablet 2 mg  2 mg Oral Q8H Fritzi Mandes, MD        Followed by  . [START ON 11/22/2020] dexamethasone (DECADRON) tablet 2 mg  2 mg Oral Daily Fritzi Mandes, MD      . finasteride (PROSCAR) tablet 5 mg  5 mg Oral Daily Fritzi Mandes, MD   5 mg at 11/18/20 0949  . gabapentin (NEURONTIN) capsule 300 mg  300 mg Oral QHS Tu, Ching T, DO   300 mg at 11/17/20 2121  . insulin aspart (novoLOG) injection 0-15 Units  0-15 Units Subcutaneous TID WC Tu, Ching T, DO   8 Units at 11/18/20 1321  . insulin aspart (novoLOG) injection 0-5 Units  0-5 Units Subcutaneous QHS Tu, Ching T, DO   3 Units at 11/17/20 2123  . insulin glargine (LANTUS) injection 20 Units  20 Units Subcutaneous Daily Fritzi Mandes, MD   20 Units at 11/18/20 0949  . iohexol (OMNIPAQUE) 350 MG/ML injection 60 mL  60 mL Intravenous Once PRN Delman Kitten, MD      . levETIRAcetam (KEPPRA) IVPB 500 mg/100 mL premix  500 mg Intravenous Q12H Tu, Ching T, DO 400 mL/hr at 11/18/20 0325 500 mg at 11/18/20 0325  . ondansetron (ZOFRAN) injection 4 mg  4 mg Intravenous Q4H PRN Fritzi Mandes, MD   4 mg at 11/16/20 1439  . tamsulosin (FLOMAX) capsule 0.4 mg  0.4 mg Oral QPC breakfast Fritzi Mandes, MD   0.4 mg at 11/18/20  0949    VITAL SIGNS: BP 116/78 (BP Location: Right Arm)   Pulse 77   Temp (!) 97.5 F (36.4 C) (Oral)   Resp 20   Ht _0  (1.626 m)   Wt 167 lb 1.7 oz (75.8 kg)   SpO2 96%   BMI 28.68 kg/m  Filed Weights   11/14/20 1538  Weight: 167 lb 1.7 oz (75.8 kg)    Estimated body mass index is 28.68 kg/m as calculated from the following:   Height as of this encounter: _1  (1.626 m).   Weight as of this encounter: 167 lb 1.7 oz (75.8 kg).  LABS: CBC:    Component Value Date/Time   WBC 9.8 11/15/2020 0420   HGB 14.4 11/15/2020 0420   HGB 14.1 01/01/2016 1000   HCT 42.9 11/15/2020 0420   HCT 42.7 01/01/2016 1000   PLT 184 11/15/2020 0420   PLT 307 01/01/2016 1000   MCV 88.3 11/15/2020 0420   MCV 86 01/01/2016 1000   NEUTROABS 7.6 11/14/2020 1453   NEUTROABS 4.6 01/01/2016 1000    LYMPHSABS 0.4 (L) 11/14/2020 1453   LYMPHSABS 1.6 01/01/2016 1000   MONOABS 0.6 11/14/2020 1453   EOSABS 0.0 11/14/2020 1453   EOSABS 0.3 01/01/2016 1000   BASOSABS 0.0 11/14/2020 1453   BASOSABS 0.0 01/01/2016 1000   Comprehensive Metabolic Panel:    Component Value Date/Time   NA 137 11/15/2020 0420   NA 139 01/01/2016 1000   K 4.3 11/15/2020 0420   CL 106 11/15/2020 0420   CO2 20 (L) 11/15/2020 0420   BUN 29 (H) 11/15/2020 0420   BUN 13 01/01/2016 1000   CREATININE 1.77 (H) 11/15/2020 0420   CREATININE 1.58 (H) 07/26/2020 1014   GLUCOSE 285 (H) 11/15/2020 0420   CALCIUM 9.1 11/15/2020 0420   AST 64 (H) 11/17/2020 1406   ALT 383 (H) 11/17/2020 1406   ALKPHOS 132 (H) 11/17/2020 1406   BILITOT 1.2 11/17/2020 1406   BILITOT 0.4 01/01/2016 1000   PROT 7.1 11/17/2020 1406   PROT 7.2 01/01/2016 1000   ALBUMIN 3.4 (L) 11/17/2020 1406   ALBUMIN 4.4 01/01/2016 1000    RADIOGRAPHIC STUDIES: CT ABDOMEN PELVIS WO CONTRAST  Result Date: 11/14/2020 CLINICAL DATA:  Lung cancer with brain metastases, febrile with acute renal failure and sepsis. 70 year old male. EXAM: CT CHEST, ABDOMEN AND PELVIS WITHOUT CONTRAST TECHNIQUE: Multidetector CT imaging of the chest, abdomen and pelvis was performed following the standard protocol without IV contrast. COMPARISON:  August 19, 2020 FINDINGS: CT CHEST FINDINGS Cardiovascular: Heart size accentuated by low lung volumes likely mildly enlarged. Three-vessel coronary artery disease. No pericardial effusion. Aortic caliber is normal. Scattered atherosclerosis. Central pulmonary vasculature with normal caliber. Limited assessment of cardiovascular structures given lack of intravenous contrast. Mediastinum/Nodes: No mediastinal lymphadenopathy. Calcified lymph nodes in the chest are unchanged about the AP window. Small pre-vascular lymph node on image 22 unchanged at 6 mm. Lungs/Pleura: Interval development of ground-glass and septal thickening in the RIGHT  chest, parenchymal assessment limited by respiratory motion. No lobar level consolidative changes. Airways are patent. No pleural effusion. LEFT hilar distortion in the setting of post treatment changes in the LEFT chest unchanged from previous imaging. Musculoskeletal: See below for full musculoskeletal details. No acute bone finding about the bony thorax. CT ABDOMEN PELVIS FINDINGS Hepatobiliary: Hepatic cysts as before. Septation in the largest with subtle calcification unchanged. Largest measuring approximately 4 cm. Partially intrahepatic gallbladder without pericholecystic stranding. No biliary duct distension. Pancreas: Pancreas without contour  abnormality. Subtle stranding may be present about the head of the pancreas versus motion artifact. No gross ductal dilation. Spleen: Spleen normal size and contour. Adrenals/Urinary Tract: Adrenal glands are normal. Renal cortical scarring bilaterally. No signs of frank hydronephrosis. Urinary bladder with smooth contours. Marked prostatomegaly is similar to the prior study with impression upon the bladder base Stomach/Bowel: Stomach is under distended. Mild gastric thickening suggested. Moderate-sized duodenal diverticulum without surrounding stranding. No sign of bowel obstruction or acute bowel process. Colonic diverticulosis. Vascular/Lymphatic: Aortic atheromatous plaque with calcification, no aneurysmal dilation of the abdominal aorta. There is no gastrohepatic or hepatoduodenal ligament lymphadenopathy. No retroperitoneal or mesenteric lymphadenopathy. No pelvic sidewall lymphadenopathy. Reproductive: Prostatomegaly as before. Other: No ascites.  No free air. Musculoskeletal: No acute bone finding. No destructive bone process. IMPRESSION: 1. Interval development of ground-glass and septal thickening in the RIGHT chest, parenchymal assessment limited by respiratory motion. Findings appear to be primarily related to atelectasis. Developing infection or even early  viral or atypical process could also have this appearance. 2. Unchanged appearance of post treatment changes in the LEFT chest. 3. Subtle stranding may be present about the head of the pancreas versus motion artifact. Correlate with pancreatic enzymes. 4. Under distension versus mild gastric thickening. Could be related to gastritis 5. Marked prostatomegaly as before. 6. Three-vessel coronary artery disease. 7. Colonic diverticulosis without evidence of acute bowel process. 8. Hepatic cysts. 9. Aortic atherosclerosis. Aortic Atherosclerosis (ICD10-I70.0). Electronically Signed   By: Zetta Bills M.D.   On: 11/14/2020 17:46   CT Head Wo Contrast  Result Date: 11/14/2020 CLINICAL DATA:  Delirium. Lung cancer with brain metastases. Lethargic and febrile with sepsis. EXAM: CT HEAD WITHOUT CONTRAST TECHNIQUE: Contiguous axial images were obtained from the base of the skull through the vertex without intravenous contrast. COMPARISON:  MRI 06/04/2020. FINDINGS: Brain: Hemorrhagic metastases in the left frontal lobe and anterior left temporal lobe. Comparison across modalities to the prior MRI is difficult, but there is suggestion of interval increase in size of these lesions which may relate to interval internal hemorrhage. There is also suggestion of increased vasogenic edema surrounding the left temporal metastasis. Additional metastases are not well characterized on this noncontrast head CT. Parieto-occipital encephalomalacia and white matter changes are grossly similar cross modalities. Vascular: Calcific atherosclerosis. No hyperdense vessel identified. Skull: No acute fracture. Sinuses/Orbits: Mild paranasal sinus mucosal thickening. Other: No mastoid effusions. IMPRESSION: 1. Comparison across modalities to the prior MRI is difficult, but there is suggestion of interval increase in size of hemorrhagic metastases in the left frontal lobe and left temporal lobe which may relate to interval internal hemorrhage.  There is also suggestion of increased vasogenic edema surrounding the left temporal metastasis. An MRI with contrast could further characterize and also better evaluate for new metastases if clinically indicated. 2. Additional known metastases are poorly evaluated on this noncontrast head CT. 3. Grossly similar parieto-occipital encephalomalacia and white matter changes. Findings discussed with Dr. Jacqualine Code Via telephone at 5:26 PM. Electronically Signed   By: Margaretha Sheffield MD   On: 11/14/2020 17:30   CT Chest Wo Contrast  Result Date: 11/14/2020 CLINICAL DATA:  Lung cancer with brain metastases, febrile with acute renal failure and sepsis. 70 year old male. EXAM: CT CHEST, ABDOMEN AND PELVIS WITHOUT CONTRAST TECHNIQUE: Multidetector CT imaging of the chest, abdomen and pelvis was performed following the standard protocol without IV contrast. COMPARISON:  August 19, 2020 FINDINGS: CT CHEST FINDINGS Cardiovascular: Heart size accentuated by low lung volumes likely mildly enlarged. Three-vessel  coronary artery disease. No pericardial effusion. Aortic caliber is normal. Scattered atherosclerosis. Central pulmonary vasculature with normal caliber. Limited assessment of cardiovascular structures given lack of intravenous contrast. Mediastinum/Nodes: No mediastinal lymphadenopathy. Calcified lymph nodes in the chest are unchanged about the AP window. Small pre-vascular lymph node on image 22 unchanged at 6 mm. Lungs/Pleura: Interval development of ground-glass and septal thickening in the RIGHT chest, parenchymal assessment limited by respiratory motion. No lobar level consolidative changes. Airways are patent. No pleural effusion. LEFT hilar distortion in the setting of post treatment changes in the LEFT chest unchanged from previous imaging. Musculoskeletal: See below for full musculoskeletal details. No acute bone finding about the bony thorax. CT ABDOMEN PELVIS FINDINGS Hepatobiliary: Hepatic cysts as before.  Septation in the largest with subtle calcification unchanged. Largest measuring approximately 4 cm. Partially intrahepatic gallbladder without pericholecystic stranding. No biliary duct distension. Pancreas: Pancreas without contour abnormality. Subtle stranding may be present about the head of the pancreas versus motion artifact. No gross ductal dilation. Spleen: Spleen normal size and contour. Adrenals/Urinary Tract: Adrenal glands are normal. Renal cortical scarring bilaterally. No signs of frank hydronephrosis. Urinary bladder with smooth contours. Marked prostatomegaly is similar to the prior study with impression upon the bladder base Stomach/Bowel: Stomach is under distended. Mild gastric thickening suggested. Moderate-sized duodenal diverticulum without surrounding stranding. No sign of bowel obstruction or acute bowel process. Colonic diverticulosis. Vascular/Lymphatic: Aortic atheromatous plaque with calcification, no aneurysmal dilation of the abdominal aorta. There is no gastrohepatic or hepatoduodenal ligament lymphadenopathy. No retroperitoneal or mesenteric lymphadenopathy. No pelvic sidewall lymphadenopathy. Reproductive: Prostatomegaly as before. Other: No ascites.  No free air. Musculoskeletal: No acute bone finding. No destructive bone process. IMPRESSION: 1. Interval development of ground-glass and septal thickening in the RIGHT chest, parenchymal assessment limited by respiratory motion. Findings appear to be primarily related to atelectasis. Developing infection or even early viral or atypical process could also have this appearance. 2. Unchanged appearance of post treatment changes in the LEFT chest. 3. Subtle stranding may be present about the head of the pancreas versus motion artifact. Correlate with pancreatic enzymes. 4. Under distension versus mild gastric thickening. Could be related to gastritis 5. Marked prostatomegaly as before. 6. Three-vessel coronary artery disease. 7. Colonic  diverticulosis without evidence of acute bowel process. 8. Hepatic cysts. 9. Aortic atherosclerosis. Aortic Atherosclerosis (ICD10-I70.0). Electronically Signed   By: Zetta Bills M.D.   On: 11/14/2020 17:46   MR BRAIN W WO CONTRAST  Result Date: 11/15/2020 CLINICAL DATA:  Encephalopathy EXAM: MRI HEAD WITHOUT AND WITH CONTRAST TECHNIQUE: Multiplanar, multiecho pulse sequences of the brain and surrounding structures were obtained without and with intravenous contrast. CONTRAST:  7.58m GADAVIST GADOBUTROL 1 MMOL/ML IV SOLN COMPARISON:  06/04/2020 FINDINGS: Brain: No acute infarct, mass effect or extra-axial collection. Multiple sites of chronic hemorrhage, greatest in the left frontal lobe and right parietal lobe. Large amount of abnormal T2-weighted signal within the white matter. There are old bilateral cerebellar infarcts. Contrast enhancing lesions are as follows: 1. Left temporal lobe 1.9 cm, previously 1.2 cm, series 21, image 11. Marked worsening of edema surrounding this lesion. 2. Left frontal lobe, 1.0 cm, previously 0.8 cm, image 15 3. Punctate midbrain lesion, unchanged, image 13 There are no new lesions identified. Vascular: Major flow voids are preserved. Skull and upper cervical spine: Normal calvarium and skull base. Visualized upper cervical spine and soft tissues are normal. Sinuses/Orbits:Right mastoid effusion. Nasopharynx is clear. Left maxillary retention cyst. Normal orbits. IMPRESSION: 1. Increased size of  left temporal lobe metastatic lesion with marked worsening of surrounding edema. 2. Increased size of left frontal metastasis. 3. Unchanged appearance of punctate midbrain lesion. No new lesions. 4. Old bilateral cerebellar infarcts and findings of chronic microvascular disease. Electronically Signed   By: Ulyses Jarred M.D.   On: 11/15/2020 00:00   US Abdomen Limited RUQ (LIVER/GB)  Result Date: 11/14/2020 CLINICAL DATA:  Right upper quadrant pain and elevated LFTs EXAM:  ULTRASOUND ABDOMEN LIMITED RIGHT UPPER QUADRANT COMPARISON:  CT from earlier in the same day. FINDINGS: Gallbladder: Gallbladder is well distended without wall thickening. Multiple folds are noted similar to that seen on prior CT. No cholelithiasis is noted. Common bile duct: Diameter: 3.4 mm. Liver: Hepatic cysts are noted similar to that seen on prior CT examination. Some septation is noted similar to that noted on prior exam. No other focal abnormality is noted. Portal vein is patent on color Doppler imaging with normal direction of blood flow towards the liver. Other: None. IMPRESSION: Paddock cysts similar to that seen on prior CT examination. No mass lesion is seen. Multiple folds within the gallbladder without complicating factors. Electronically Signed   By: Inez Catalina M.D.   On: 11/14/2020 20:02    PERFORMANCE STATUS (ECOG) : 2 - Symptomatic, <50% confined to bed  Review of Systems Unless otherwise noted, a complete review of systems is negative.  Physical Exam General: NAD Pulmonary: Unlabored Extremities: no edema, no joint deformities Skin: no rashes Neurological: Weakness but otherwise nonfocal  IMPRESSION: I met with patient and wife to discuss goals.  Patient is known to me from the clinic.  Today, patient reports that he is doing better.  Altered mental status seems improved with steroids.  Plan is for patient to pursue XRT for hemorrhagic brain mets.  Both patient and wife verbalized agreement with this plan.  They are interested in seeking further treatment for the cancer.  Both patient and wife seem understanding regarding his overall prognosis.  They understand that the cancer is incurable.  Wife says that patient and daughter completed ACP documents but never had them signed.  At that time, patient wanted to remain a full code.  We discussed this in detail today including probable futility associated with aggressive resuscitation in the setting of a terminal  illness.  Patient tells me that he does not think that he would want to be resuscitated.  His wife agrees with this decision and states that she made her own mother a DNR prior to end-of-life.  However, both patient and wife are somewhat deferential to the wishes of their children.  I attempted to call patient's daughter but was not able to reach her.  It may be helpful to have daughter follow-up with patient in the cancer center so that we can more extensively discuss goals and decision-making.  PLAN: -Continue current scope of treatment -Recommend PT eval -Probable dispo: home with home health and palliative care -Will plan follow up in the clinic to continue goals of care conversations  Case and plan discussed with Dr. Rogue Bussing   Time Total: 60 minutes  Visit consisted of counseling and education dealing with the complex and emotionally intense issues of symptom management and palliative care in the setting of serious and potentially life-threatening illness.Greater than 50%  of this time was spent counseling and coordinating care related to the above assessment and plan.  Signed by: Altha Harm, PhD, NP-C

## 2020-11-18 NOTE — Progress Notes (Signed)
[facility] Willis at [place] NAME: Melvin Willis    MR#:  [id number]  DATE OF BIRTH:  09-05-1951  SUBJECTIVE:  Tired.  Wants to get his radiation treatment started REVIEW OF SYSTEMS:   Review of Systems  Constitutional: Positive for malaise/fatigue. Negative for chills, fever and weight loss.  HENT: Negative for ear discharge, ear pain and nosebleeds.   Eyes: Negative for blurred vision, pain and discharge.  Respiratory: Negative for sputum production, shortness of breath, wheezing and stridor.   Cardiovascular: Negative for chest pain, palpitations, orthopnea and PND.  Gastrointestinal: Negative for abdominal pain, diarrhea, nausea and vomiting.  Genitourinary: Negative for frequency and urgency.  Musculoskeletal: Negative for back pain and joint pain.  Neurological: Positive for weakness. Negative for sensory change, speech change and focal weakness.  Psychiatric/Behavioral: Negative for depression and hallucinations. The patient is not nervous/anxious.    Tolerating Diet:yes Tolerating PT: Pending  DRUG ALLERGIES:  No Known Allergies  VITALS:  Blood pressure 116/78, pulse 77, temperature (!) 97.5 F (36.4 C), temperature source Oral, resp. rate 20, height 5\' 4"  (1.626 m), weight 75.8 kg, SpO2 96 %.  PHYSICAL EXAMINATION:   Physical Exam  GENERAL:  70 y.o.-year-old patient lying in the bed with no acute distress. Appears ill and fatigue LUNGS: Normal breath sounds bilaterally, no wheezing, rales, rhonchi. No use of accessory muscles of respiration.  CARDIOVASCULAR: S1, S2 normal. No murmurs, rubs, or gallops.  ABDOMEN: Soft, nontender, nondistended. Bowel sounds present. No organomegaly or mass.  EXTREMITIES: No cyanosis, clubbing or edema b/l.    NEUROLOGIC: Cranial nerves II through XII are intact. No focal Motor or sensory deficits b/l.   PSYCHIATRIC:  patient is alert and oriented x 3.  SKIN: No obvious rash, lesion, or ulcer.    LABORATORY PANEL:  CBC Recent Labs  Lab 11/15/20 0420  WBC 9.8  HGB 14.4  HCT 42.9  PLT 184    Chemistries  Recent Labs  Lab 11/15/20 0420 11/17/20 1406  NA 137  --   K 4.3  --   CL 106  --   CO2 20*  --   GLUCOSE 285*  --   BUN 29*  --   CREATININE 1.77*  --   CALCIUM 9.1  --   AST 629* 64*  ALT 899* 383*  ALKPHOS 175* 132*  BILITOT 5.7* 1.2   Cardiac Enzymes No results for input(s): TROPONINI in the last 168 hours. RADIOLOGY:  No results found. ASSESSMENT AND PLAN:  Melvin Willis is a 70 y.o. 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  Acute metabolic encephalopathy -POA, now improving --Multifactorial from worsening hemorrhagic metastasis and increased vasogenic edema as well as sepsis from questionable pneumonia versus intra-abdominal infection --Neurosurgery Dr Rhea Bleacher input noted--continue Keppra 500 BID for seizure prophylaxis and continue IV Decadron 4 mg Q ID for vasoedema with tapering dose placed by pharmacy --CT head shows changes in the size of hemorrhagic metastasis with increased vasogenic edema. -- MRI brain:Increased size of left temporal lobe metastatic lesion with marked worsening of surrounding edema. Increased size of left frontal metastasis.  Severe sepsis secondary to Klebsiella pneumonia bacteremia: POA --Patient presented with fever, tachycardia and elevated lactate of greater than 4--3.3--1.7 --BCID 4/4 [place]se[place].  Unclear source -? liver cyst versus ascending  cholangitis -ID consultation appreciated.  Continue Rocephin for now --3/13--repeat blood cultures negative till now  Hx of stage IV adenocarcinoma of the lung with brain metastasis --Discussed with Dr. Rogue Bussing, Dr. Baruch Gouty & Josh from palliative care -Plan is to  continue chemo, radiation and outpatient palliative care to follow  Acute hypoxic respiratory failure--resolved --Now on room air  Transaminitis/supratherapeutic INR/Hyperbilirubunemia ?cholangitis --Continue management as above --LFt's trending down  --US abdomen shows  cysts similar to that seen on prior CT examination. No mass lesion is seen. Multiple folds within the gallbladder without complicating factors.  Type 2 diabetes with hyperglycemia --Hemoglobin A1c of 10.5 on 2/24 --Continue moderate sliding scale, can uptitrate depending on hyperglycemia while on steroids  History of DVT/PE --Hold Xarelto given possible worsening hemorrhagic metastasis of the brain for atleast 1 week per Neuro sx recs  Acute on CKD stage IIIa/pre-renal in the setting of sepsis --Creatinine of 2.18--1.77 --baseline creat  1.6  Generalized weakness We will get a PT evaluation    DVT prophylaxis:.SCD Code Status: Full Family Communication:  wife updated by Dr. Posey Pronto on 3/13.  None today disposition Plan: Home with at least 2 midnight stays  Consults called: Oncology, neurosx, ID, radiation oncology, palliative care Admission status: inpatient  Status is: Inpatient  Remains inpatient appropriate because:Inpatient level of care appropriate due to severity of illness currently on IV abxs. Need to determine duration and dosage  Dispo: The patient is from: Home  Anticipated d/c is to: Home in 1-2 days  Patient currently is not medically stable to d/c.              Difficult to place patient No Level of care: Med-Surg    TOTAL TIME TAKING CARE OF THIS PATIENT: *25* minutes.  >50% time spent on counselling and coordination of care  Note: This dictation was prepared with Dragon dictation along with smaller phrase technology. Any transcriptional errors that result from this process are unintentional.  [name] M.D    [facility]    CC: Primary care physician; [name], MDPatient ID: Melvin Willis, male   DOB: 01/09/1951, 70 y.o.   MRN: [id number]

## 2020-11-19 ENCOUNTER — Inpatient Hospital Stay: Payer: Medicare Other

## 2020-11-19 ENCOUNTER — Ambulatory Visit: Payer: Medicare Other

## 2020-11-19 DIAGNOSIS — I82402 Acute embolism and thrombosis of unspecified deep veins of left lower extremity: Secondary | ICD-10-CM | POA: Insufficient documentation

## 2020-11-19 DIAGNOSIS — R5383 Other fatigue: Secondary | ICD-10-CM | POA: Insufficient documentation

## 2020-11-19 DIAGNOSIS — Z8249 Family history of ischemic heart disease and other diseases of the circulatory system: Secondary | ICD-10-CM | POA: Insufficient documentation

## 2020-11-19 DIAGNOSIS — Z79899 Other long term (current) drug therapy: Secondary | ICD-10-CM | POA: Insufficient documentation

## 2020-11-19 DIAGNOSIS — R7881 Bacteremia: Secondary | ICD-10-CM | POA: Diagnosis not present

## 2020-11-19 DIAGNOSIS — N183 Chronic kidney disease, stage 3 unspecified: Secondary | ICD-10-CM | POA: Insufficient documentation

## 2020-11-19 DIAGNOSIS — Z818 Family history of other mental and behavioral disorders: Secondary | ICD-10-CM | POA: Insufficient documentation

## 2020-11-19 DIAGNOSIS — R413 Other amnesia: Secondary | ICD-10-CM | POA: Insufficient documentation

## 2020-11-19 DIAGNOSIS — A419 Sepsis, unspecified organism: Secondary | ICD-10-CM | POA: Diagnosis not present

## 2020-11-19 DIAGNOSIS — Z833 Family history of diabetes mellitus: Secondary | ICD-10-CM | POA: Insufficient documentation

## 2020-11-19 DIAGNOSIS — E785 Hyperlipidemia, unspecified: Secondary | ICD-10-CM | POA: Insufficient documentation

## 2020-11-19 DIAGNOSIS — Z6828 Body mass index (BMI) 28.0-28.9, adult: Secondary | ICD-10-CM | POA: Insufficient documentation

## 2020-11-19 DIAGNOSIS — Z794 Long term (current) use of insulin: Secondary | ICD-10-CM | POA: Insufficient documentation

## 2020-11-19 DIAGNOSIS — G9341 Metabolic encephalopathy: Secondary | ICD-10-CM | POA: Diagnosis not present

## 2020-11-19 DIAGNOSIS — Z510 Encounter for antineoplastic radiation therapy: Secondary | ICD-10-CM | POA: Insufficient documentation

## 2020-11-19 DIAGNOSIS — N401 Enlarged prostate with lower urinary tract symptoms: Secondary | ICD-10-CM | POA: Insufficient documentation

## 2020-11-19 DIAGNOSIS — N179 Acute kidney failure, unspecified: Secondary | ICD-10-CM | POA: Diagnosis not present

## 2020-11-19 DIAGNOSIS — Z86711 Personal history of pulmonary embolism: Secondary | ICD-10-CM | POA: Insufficient documentation

## 2020-11-19 DIAGNOSIS — Z8042 Family history of malignant neoplasm of prostate: Secondary | ICD-10-CM | POA: Insufficient documentation

## 2020-11-19 DIAGNOSIS — E1122 Type 2 diabetes mellitus with diabetic chronic kidney disease: Secondary | ICD-10-CM | POA: Insufficient documentation

## 2020-11-19 DIAGNOSIS — R7401 Elevation of levels of liver transaminase levels: Secondary | ICD-10-CM | POA: Diagnosis not present

## 2020-11-19 DIAGNOSIS — C3412 Malignant neoplasm of upper lobe, left bronchus or lung: Secondary | ICD-10-CM | POA: Insufficient documentation

## 2020-11-19 DIAGNOSIS — Z8719 Personal history of other diseases of the digestive system: Secondary | ICD-10-CM | POA: Insufficient documentation

## 2020-11-19 DIAGNOSIS — R410 Disorientation, unspecified: Secondary | ICD-10-CM

## 2020-11-19 DIAGNOSIS — C7931 Secondary malignant neoplasm of brain: Secondary | ICD-10-CM | POA: Diagnosis not present

## 2020-11-19 DIAGNOSIS — M2550 Pain in unspecified joint: Secondary | ICD-10-CM | POA: Insufficient documentation

## 2020-11-19 LAB — COMPREHENSIVE METABOLIC PANEL
ALT: 218 U/L — ABNORMAL HIGH (ref 0–44)
AST: 27 U/L (ref 15–41)
Albumin: 3.2 g/dL — ABNORMAL LOW (ref 3.5–5.0)
Alkaline Phosphatase: 106 U/L (ref 38–126)
Anion gap: 9 (ref 5–15)
BUN: 84 mg/dL — ABNORMAL HIGH (ref 8–23)
CO2: 16 mmol/L — ABNORMAL LOW (ref 22–32)
Calcium: 9.1 mg/dL (ref 8.9–10.3)
Chloride: 116 mmol/L — ABNORMAL HIGH (ref 98–111)
Creatinine, Ser: 4.09 mg/dL — ABNORMAL HIGH (ref 0.61–1.24)
GFR, Estimated: 15 mL/min — ABNORMAL LOW (ref >60)
Glucose, Bld: 271 mg/dL — ABNORMAL HIGH (ref 70–99)
Potassium: 5 mmol/L (ref 3.5–5.1)
Sodium: 141 mmol/L (ref 135–145)
Total Bilirubin: 1.3 mg/dL — ABNORMAL HIGH (ref 0.3–1.2)
Total Protein: 7 g/dL (ref 6.5–8.1)

## 2020-11-19 LAB — CBC
HCT: 42.1 % (ref 39.0–52.0)
Hemoglobin: 14.4 g/dL (ref 13.0–17.0)
MCH: 29.6 pg (ref 26.0–34.0)
MCHC: 34.2 g/dL (ref 30.0–36.0)
MCV: 86.6 fL (ref 80.0–100.0)
Platelets: 169 10*3/uL (ref 150–400)
RBC: 4.86 MIL/uL (ref 4.22–5.81)
RDW: 15.2 % (ref 11.5–15.5)
WBC: 11.5 10*3/uL — ABNORMAL HIGH (ref 4.0–10.5)
nRBC: 0 % (ref 0.0–0.2)

## 2020-11-19 LAB — GLUCOSE, CAPILLARY
Glucose-Capillary: 265 mg/dL — ABNORMAL HIGH (ref 70–99)
Glucose-Capillary: 272 mg/dL — ABNORMAL HIGH (ref 70–99)
Glucose-Capillary: 266 mg/dL — ABNORMAL HIGH (ref 70–99)
Glucose-Capillary: 195 mg/dL — ABNORMAL HIGH (ref 70–99)

## 2020-11-19 MED ORDER — INSULIN GLARGINE 100 UNIT/ML ~~LOC~~ SOLN
25.0000 [IU] | Freq: Every day | SUBCUTANEOUS | Status: DC
  Administered 2020-11-20: 09:00:00 25 [IU] via SUBCUTANEOUS
  Filled 2020-11-19 (×2): qty 0.25

## 2020-11-19 MED ORDER — SODIUM CHLORIDE 0.9 % IV SOLN: INTRAVENOUS | Status: AC

## 2020-11-19 MED ORDER — CEFAZOLIN SODIUM-DEXTROSE 2-4 GM/100ML-% IV SOLN
2.0000 g | Freq: Two times a day (BID) | INTRAVENOUS | Status: DC
  Administered 2020-11-19 – 2020-11-20 (×2): 2 g via INTRAVENOUS
  Filled 2020-11-19 (×3): qty 100

## 2020-11-19 MED ORDER — CHLORHEXIDINE GLUCONATE CLOTH 2 % EX PADS
6.0000 | MEDICATED_PAD | Freq: Every day | CUTANEOUS | Status: DC
  Administered 2020-11-19 – 2020-11-20 (×2): 6 via TOPICAL

## 2020-11-19 NOTE — Progress Notes (Signed)
PHARMACY NOTE:  ANTIMICROBIAL RENAL DOSAGE ADJUSTMENT  Current antimicrobial regimen includes a mismatch between antimicrobial dosage and estimated renal function.  As per policy approved by the Pharmacy & Therapeutics and Medical Executive Committees, the antimicrobial dosage will be adjusted accordingly.  Current antimicrobial dosage:  Cefazolin 2gm IV q8h  Indication: bacteremia  Renal Function:  Estimated Creatinine Clearance: 16.3 mL/min (A) (by C-G formula based on SCr of 4.09 mg/dL (H)).     Antimicrobial dosage has been changed to:  Cefazolin 2 gm IV q12h for Crcl 10-30 ml/min  Additional comments:   Thank you for allowing pharmacy to be a part of this patient's care.  Malaika Arnall A, [facility] 11/19/2020 10:02 AM

## 2020-11-19 NOTE — Progress Notes (Signed)
[place] at [place] NAME: Melvin Willis    MR#:  [id number]  DATE OF BIRTH:  February 15, 1951  SUBJECTIVE:  Worsening creatinine.  He feels tired.  Waiting for radiation treatment today REVIEW OF SYSTEMS:   Review of Systems  Constitutional: Positive for malaise/fatigue. Negative for chills, fever and weight loss.  HENT: Negative for ear discharge, ear pain and nosebleeds.   Eyes: Negative for blurred vision, pain and discharge.  Respiratory: Negative for sputum production, shortness of breath, wheezing and stridor.   Cardiovascular: Negative for chest pain, palpitations, orthopnea and PND.  Gastrointestinal: Negative for abdominal pain, diarrhea, nausea and vomiting.  Genitourinary: Negative for frequency and urgency.  Musculoskeletal: Negative for back pain and joint pain.  Neurological: Positive for weakness. Negative for sensory change, speech change and focal weakness.  Psychiatric/Behavioral: Negative for depression and hallucinations. The patient is not nervous/anxious.    Tolerating Diet:yes Tolerating PT: Home health PT DRUG ALLERGIES:  No Known Allergies  VITALS:  Blood pressure (!) 138/94, pulse 75, temperature 97.7 F (36.5 C), resp. rate 18, height 5\' 4"  (1.626 m), weight 75.8 kg, SpO2 97 %.  PHYSICAL EXAMINATION:   Physical Exam  GENERAL:  70 y.o.-year-old patient lying in the bed with no acute distress. Appears ill and fatigue LUNGS: Normal breath sounds bilaterally, no wheezing, rales, rhonchi. No use of accessory muscles of respiration.  CARDIOVASCULAR: S1, S2 normal. No murmurs, rubs, or gallops.  ABDOMEN: Soft, nontender, nondistended. Bowel sounds present. No organomegaly or mass.  EXTREMITIES: No cyanosis, clubbing or edema b/l.    NEUROLOGIC: Cranial nerves II through XII are intact. No focal Motor or sensory deficits b/l.   PSYCHIATRIC:  patient is alert and oriented x 3.  SKIN: No obvious rash, lesion, or  ulcer.   LABORATORY PANEL:  CBC Recent Labs  Lab 11/19/20 0557  WBC 11.5*  HGB 14.4  HCT 42.1  PLT 169    Chemistries  Recent Labs  Lab 11/19/20 0557  NA 141  K 5.0  CL 116*  CO2 16*  GLUCOSE 271*  BUN 84*  CREATININE 4.09*  CALCIUM 9.1  AST 27  ALT 218*  ALKPHOS 106  BILITOT 1.3*   Cardiac Enzymes No results for input(s): TROPONINI in the last 168 hours. RADIOLOGY:  No results found. ASSESSMENT AND PLAN:  Melvin Willis is a 70 y.o. 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  Acute metabolic encephalopathy -POA, now improving --Multifactorial from worsening hemorrhagic metastasis and increased vasogenic edema, uremia as well as sepsis from questionable pneumonia versus intra-abdominal infection --Neurosurgery Dr Rhea Bleacher input noted--continue Keppra 500 BID for seizure prophylaxis and continue IV Decadron 4 mg Q ID for vasoedema with tapering dose placed by pharmacy --CT head shows changes in the size of hemorrhagic metastasis with increased vasogenic edema. -- MRI brain:Increased size of left temporal lobe metastatic lesion with marked worsening of surrounding edema. Increased size of left frontal metastasis.  Severe sepsis secondary to Klebsiella pneumonia bacteremia: POA --Patient presented with fever, tachycardia and elevated lactate of greater than 4--3.3--1.7 --BCID 4/4 [place].  Unclear source -? liver cyst versus ascending cholangitis -ID following.  Switched to IV Ancef today on 3/15 --3/13--repeat blood cultures negative till now  Acute on CKD stage IIIa/pre-renal in the setting of sepsis  Acute kidney injury could be from ATN --Creatinine of 2.18--1.77-> 4.09 --baseline creat  1.6 -Consult nephrology  Hx of stage IV adenocarcinoma of the  lung with brain metastasis --Discussed with Dr. Rogue Bussing, Dr. Baruch Gouty & Josh from palliative care -Plan is to continue chemo, radiation and outpatient palliative care to follow - Getting radiation treatment today  Acute hypoxic respiratory failure--resolved --Now on room air  Transaminitis/supratherapeutic INR/Hyperbilirubunemia ?cholangitis --Continue management as above --LFt's trending down  --US abdomen shows  cysts similar to that seen on prior CT examination. No mass lesion is seen. Multiple folds within the gallbladder without complicating factors.  Type 2 diabetes with hyperglycemia --Hemoglobin A1c of 10.5 on 2/24 --Continue moderate sliding scale, can uptitrate depending on hyperglycemia while on steroids  History of DVT/PE --Hold Xarelto given possible worsening hemorrhagic metastasis of the brain for atleast 1 week per Neuro sx recs  Generalized weakness PT recommends home health PT    DVT prophylaxis:.SCD Code Status: Full Family Communication:  wife updated over phone on 3/14.  None today disposition Plan: Home with home health Consults called: Oncology, neurosx, ID, radiation oncology, palliative care Admission status: inpatient  Status is: Inpatient  Remains inpatient appropriate because:Inpatient level of care appropriate due to severity of illness currently on IV abxs. Need to determine duration and dosage  Dispo: The patient is from: Home  Anticipated d/c is to: Home in 1-2 days  Patient currently is not medically stable to d/c.              Difficult to place patient No Level of care: Med-Surg    TOTAL TIME TAKING CARE OF THIS PATIENT: *25* minutes.  >50% time spent on counselling and coordination of care  Note: This dictation was prepared with Dragon dictation along with smaller phrase technology. Any transcriptional errors that result from this process are unintentional.  [name] M.D    [facility]   CC: Primary care physician; [name], MDPatient ID: Melvin Willis, male   DOB: [birth year], 70 y.o.   MRN: [id number]

## 2020-11-19 NOTE — Progress Notes (Signed)
   Date of Admission:  11/14/2020      ID: Melvin Willis is a 70 y.o. male  Active Problems:   Type 2 diabetes mellitus with stage 3 chronic kidney disease ([place])   Primary cancer of left upper lobe of lung ([place])   Brain metastasis ([place])   Sepsis ([place])   Septic shock ([place])   Acute metabolic encephalopathy   Acute respiratory failure ([facility])   Transaminitis   Supratherapeutic INR   Acute-on-chronic kidney injury ([place])   Palliative care encounter    Subjective: Pt had urinary retention and foley placed Drained more than a litre Doing better  Medications:  . dexamethasone (DECADRON) injection  4 mg Intravenous Q8H   Followed by  . [START ON 11/20/2020] dexamethasone  2 mg Oral Q8H   Followed by  . [START ON 11/22/2020] dexamethasone  2 mg Oral Daily  . finasteride  5 mg Oral Daily  . gabapentin  300 mg Oral QHS  . insulin aspart  0-15 Units Subcutaneous TID WC  . insulin aspart  0-5 Units Subcutaneous QHS  . insulin glargine  20 Units Subcutaneous Daily  . tamsulosin  0.4 mg Oral QPC breakfast    Objective: Vital signs in last 24 hours: Temp:  [97.4 F (36.3 C)-97.7 F (36.5 C)] 97.7 F (36.5 C) (03/15 1156) Pulse Rate:  [54-75] 75 (03/15 1156) Resp:  [15-18] 18 (03/15 1156) BP: (132-138)/(79-94) 138/94 (03/15 1156) SpO2:  [94 %-97 %] 97 % (03/15 1156)  PHYSICAL EXAM:  General: Alert, cooperative, no distress, oriented in place and person Lungs: Clear to auscultation bilaterally. No Wheezing or Rhonchi. No rales. Heart: Regular rate and rhythm, no murmur, rub or gallop. Abdomen: Soft, foley in place Extremities: atraumatic, no cyanosis. No edema. No clubbing Skin: No rashes or lesions. Or bruising Lymph: Cervical, supraclavicular normal. Neurologic: did not examine in detail  Lab Results Recent Labs    11/19/20 0557  WBC 11.5*  HGB 14.4  HCT 42.1  NA 141  K 5.0  CL 116*  CO2 16*  BUN 84*  CREATININE 4.09*   Liver Panel Recent Labs     11/17/20 1406 11/19/20 0557  PROT 7.1 7.0  ALBUMIN 3.4* 3.2*  AST 64* 27  ALT 383* 218*  ALKPHOS 132* 106  BILITOT 1.2 1.3*  BILIDIR 0.3*  --   IBILI 0.9  --    Microbiology: Blood culture 11/14/2020 Klebsiella pneumonia 11/17/2020 no growth 11/14/2020 urine culture: Klebsiella pneumoniae   Assessment/Plan: Encephalopathy? Infection? Brain mets ? ?Klebsiella pneumonia bacteremia UA did not show any WBC. He has prostatomegaly.  Has urinary retention-  CT scan of the abdomen did not show any hydronephrosis. Abnormal lfts- increased bilirubin, transaminitis Rule out liver source like infected hepatic cyst versus ascending cholangitis ON cefazolin  AKI on CKD- worsening creatinine today-due to urinary retention- foley placed and more than a liter urine drained Nephrology on board  Acute hepatitis with increased bilirubin, trnsaminitis-unclear etiology  ? due to infection ? rule out medication induced.   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

## 2020-11-19 NOTE — Progress Notes (Signed)
Physical Therapy Treatment Patient Details Name: Melvin Willis MRN: [id number] DOB: Jul 05, 1951 Today's Date: 11/19/2020    History of Present Illness Melvin Willis is a 70 y.o. 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    PT Comments    Patient required increased assistance today with functional mobility compared to yesterday. Patient needed physical assistance for bed mobility and sit to stand transfers performed x 3 bouts. Unable to progress walking due to generalized weakness and poor standing tolerance/balance. Given current level of assistance required for safe mobility, discharge recommendation has been changed to SNF. Recommend to continue PT to maximize independence to facilitate return to prior level of function.     Follow Up Recommendations  SNF     Equipment Recommendations  Rolling walker with 5" wheels;Wheelchair (measurements PT);Wheelchair cushion (measurements PT);3in1 (PT);Hospital bed    Recommendations for Other Services       Precautions / Restrictions Precautions Precautions: Fall Restrictions Weight Bearing Restrictions: No    Mobility  Bed Mobility Overal bed mobility: Needs Assistance Bed Mobility: Supine to Sit;Sit to Supine     Supine to sit: Mod assist Sit to supine: Mod assist   General bed mobility comments: assistance for trunk and BLE support. verbal cues for task initiaion    Transfers Overall transfer level: Needs assistance Equipment used: None Transfers: Sit to/from Stand Sit to Stand: Max assist         General transfer comment: performed sit to stand transfer x 3 reps with Max A, lifting and lowering assistance provided. verbal cues for hand placement and technique to facilitate independence.  Ambulation/Gait              General Gait Details: unsafe to attempt due to poor standing tolerance, generalized weakness   Stairs             Wheelchair Mobility    Modified Rankin (Stroke Patients Only)       Balance Overall balance assessment: Needs assistance Sitting-balance support: Feet supported Sitting balance-Leahy Scale: Poor Sitting balance - Comments: occasional posterior lean that is corrected to midline with cueing.     Standing balance-Leahy Scale: Poor Standing balance comment: patient required Mod A to maintain balance without UE support and was unable to weight shift to narrow base of support in preparation for ambulation. posterior loss of balance primarly and patient relying on bed for posterior leg support                            Cognition Arousal/Alertness: Lethargic Behavior During Therapy: Flat affect                                   General Comments: patient is oriented to self only. patient able to follow commands with extra time      Exercises General Exercises - Lower Extremity Ankle Circles/Pumps: AAROM;Strengthening;Both;5 reps;Supine Long Arc Quad: AROM;Strengthening;Both;5 reps;Supine Heel Slides: AAROM;Strengthening;Both;10 reps;Supine Hip ABduction/ADduction: AAROM;Strengthening;Both;10 reps;Supine Straight Leg Raises: AAROM;Strengthening;Both;10 reps;Supine Other Exercises Other Exercises: verbal and visual cues for exercise technique for strengthening    General Comments        Pertinent Vitals/Pain Pain Assessment: No/denies pain    Home Living  Prior Function            PT Goals (current goals can now be found in the care plan section) Acute Rehab PT Goals Patient Stated Goal: none stated PT Goal Formulation: With patient/family Time For Goal Achievement: 11/25/20 Potential to Achieve Goals: Fair Progress towards PT goals: Progressing toward goals     Frequency    Min 2X/week      PT Plan Discharge plan needs to be updated    Co-evaluation              AM-PAC PT "6 Clicks" Mobility   Outcome Measure  Help needed turning from your back to your side while in a flat bed without using bedrails?: A Little Help needed moving from lying on your back to sitting on the side of a flat bed without using bedrails?: A Lot Help needed moving to and from a bed to a chair (including a wheelchair)?: A Lot Help needed standing up from a chair using your arms (e.g., wheelchair or bedside chair)?: A Lot Help needed to walk in hospital room?: A Lot Help needed climbing 3-5 steps with a railing? : Total 6 Click Score: 12    End of Session Equipment Utilized During Treatment: Gait belt Activity Tolerance: Patient limited by fatigue Patient left: in bed;with call bell/phone within reach;with bed alarm set;with family/visitor present Nurse Communication: Mobility status PT Visit Diagnosis: Unsteadiness on feet (R26.81);Other abnormalities of gait and mobility (R26.89);Muscle weakness (generalized) (M62.81);Difficulty in walking, not elsewhere classified (R26.2)     Time: 7741-2878 PT Time Calculation (min) (ACUTE ONLY): 33 min  Charges:  $Therapeutic Exercise: 8-22 mins $Therapeutic Activity: 8-22 mins                     Melvin Willis, PT, MPT    [facility] 11/19/2020, 3:05 PM

## 2020-11-19 NOTE — Care Management Important Message (Signed)
Important Message  Patient Details  Name: HAIM HANSSON MRN: [id number] Date of Birth: January 01, 1951   Medicare Important Message Given:  Yes     Juliann Pulse A Paulyne Mooty 11/19/2020, 11:18 AM

## 2020-11-19 NOTE — Progress Notes (Signed)
Inpatient Diabetes Program Recommendations  AACE/ADA: New Consensus Statement on Inpatient Glycemic Control (2015)  Target Ranges:  Prepandial:   less than 140 mg/dL      Peak postprandial:   less than 180 mg/dL (1-2 hours)      Critically ill patients:  140 - 180 mg/dL   Results for AMARDEEP, BECKERS (MRN [id number]) as of 11/19/2020 07:16  Ref. Range 11/18/2020 09:19 11/18/2020 12:12 11/18/2020 16:15 11/18/2020 20:23  Glucose-Capillary Latest Ref Range: 70 - 99 mg/dL 216 (H)  5 units NOVOLOG  20 units LANTUS  281 (H)  8 units NOVOLOG  371 (H)  15 units NOVOLOG  306 (H)  4 units NOVOLOG     Home DM Meds: [place] 15-30 units Daily                             Metformin 750 mg BID  Current Orders: Novolog Moderate Correction Scale/ SSI (0-15 units) TID AC + HS      Lantus 20 units Daily     MD- Note patient getting Decadron 4 mg Q8 hours Lantus started 03/13  Please consider:  1. Increase Lantus to 25 units Daily  2. Start Novolog Meal Coverage:  Novolog 6 units TID with meals Hold if pt eats <50% of meal, Hold if pt NPO    --Will follow patient during hospitalization--  Wyn Quaker RN, MSN, CDE Diabetes Coordinator Inpatient Glycemic Control Team Team Pager: [phone] (8a-5p)

## 2020-11-19 NOTE — Progress Notes (Signed)
Spoke with the patient and his wife at the bedside.  PT stated that the patient did not do as well today and they recommend SNF, CM explained that SNF would likely request to postpone Radiation treatment that is supposed to start Next week, The patient and his wife choose not to postpone Radiation treatment and instead would like to go home with Home health Services, CM offered Nurse PT and OT, they would like only PT,, [place] set up with [facility], confirmed by [facility]. DME set up with [employer] to be delivered to the home is 3 in 1, Hospital Bed, and Light weight Wheelchair CM explaiend the the patient and the wife that the DME would not be delivered if someone did not answer the phone, They stated understanding

## 2020-11-19 NOTE — Consult Note (Signed)
[facility] Consult Note:    Date of Admission:  11/14/2020           Reason for Consult: ARF    Referring Provider: [name], MD Primary Care Provider: [name], MD   History of Presenting Illness:  Melvin Willis is a 70 y.o. male  Patient is critically ill.  Minimal participation in H&P.  Most information is obtained from the chart as well as from patient's wife who is at bedside. 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  He is getting radiation, Avastin and entrectinib This time he was admitted for altered mental status.  CT of the brain revealed hemorrhagic metastasis with vasogenic edema, progressive brain metastasis.  He is also being treated for sepsis with Klebsiella bacteremia. Blood culture positive for Klebsiella on March 10.  Palliative care is working with family to establish goals of care.  Review of Systems: ROS ROS is limited as patient is severely ill and not able to provide detailed meaningful information.  He does report decreased urine output, abdominal distention.  Decreased appetite.  No nausea or vomiting.  No fevers or chills.  Does have some lower extremity edema.   Past Medical History:  Diagnosis Date  . Abnormal prostate specific antigen 08/08/2012  . Adiposity 04/16/2015  . Chronic kidney disease (CKD), stage III (moderate) ([place]) 11/27/2016  . CVA (cerebral vascular accident) ([place]) 06/17/2016  . Diabetes mellitus without complication ([place])   . Diverticulosis of sigmoid colon 04/16/2015  . Dyslipidemia 03/18/2015  . Hemorrhoids, internal 04/16/2015  . Hypercholesteremia 04/16/2015  . Hyperlipidemia   . Hypertension   . Primary cancer of left upper lobe of lung ([place])   . Pulmonary embolism ([place])   . Wears dentures    partial upper    Social History   Tobacco Use  . Smoking status: Never Smoker  . Smokeless tobacco: Never Used  Vaping Use  . Vaping Use: Never used  Substance  Use Topics  . Alcohol use: No    Alcohol/week: 0.0 standard drinks  . Drug use: No    Family History  Problem Relation Age of Onset  . Diabetes Mother   . Diabetes Father   . CAD Father   . Dementia Father   . Diabetes Sister   . Cancer Maternal Uncle        Prostate  . Cancer Cousin        prostate     OBJECTIVE: Blood pressure (!) 138/94, pulse 75, temperature 97.7 F (36.5 C), resp. rate 18, height 5\' 4"  (1.626 m), weight 75.8 kg, SpO2 97 %.  Physical Exam  Physical Exam: General:  No acute distress, laying in the bed, chronically ill-appearing  HEENT  anicteric, moist oral mucous membrane  Pulm/lungs  normal breathing effort, lungs are clear to auscultation  CVS/Heart  regular rhythm, no rub or gallop  Abdomen:   Soft, nontender, distended especially lower abdomen  Extremities:  + Dependent peripheral edema  Neurologic:  Alert, able to follow few simple commands  Skin:  No acute rashes  External urine collection system   Lab Results Lab Results  Component Value Date   WBC 11.5 (H) 11/19/2020   HGB 14.4 11/19/2020   HCT 42.1 11/19/2020   MCV 86.6 11/19/2020   PLT 169 11/19/2020    Lab Results  Component Value Date   CREATININE 4.09 (H) 11/19/2020   BUN 84 (H) 11/19/2020   NA 141 11/19/2020  K 5.0 11/19/2020   CL 116 (H) 11/19/2020   CO2 16 (L) 11/19/2020    Lab Results  Component Value Date   ALT 218 (H) 11/19/2020   AST 27 11/19/2020   ALKPHOS 106 11/19/2020   BILITOT 1.3 (H) 11/19/2020     Microbiology: Recent Results (from the past 240 hour(s))  Blood Culture (routine x 2)     Status: Abnormal   Collection Time: 11/14/20  2:53 PM   Specimen: BLOOD  Result Value Ref Range Status   Specimen Description   Final    BLOOD RIGHT ANTECUBITAL Performed at [facility], [address], [place], [place] [address]    Special Requests   Final    BOTTLES DRAWN AEROBIC AND ANAEROBIC Blood Culture adequate volume Performed at [facility], [address] [place], [place] [address]    Culture  Setup Time   Final    GRAM NEGATIVE RODS IN BOTH AEROBIC AND ANAEROBIC BOTTLES CRITICAL VALUE NOTED.  VALUE IS CONSISTENT WITH PREVIOUSLY REPORTED AND CALLED VALUE. Performed at [facility], [address] [place], [place] [address]    Culture (A)  Final    KLEBSIELLA PNEUMONIAE SUSCEPTIBILITIES PERFORMED ON PREVIOUS CULTURE WITHIN THE LAST 5 DAYS. Performed at [facility], [place] [address] [place], [place] [address]    Report Status 11/17/2020 FINAL  Final  Resp Panel by RT-PCR (Flu A&B, Covid) Nasopharyngeal Swab     Status: None   Collection Time: 11/14/20  3:49 PM   Specimen: Nasopharyngeal Swab; Nasopharyngeal(NP) swabs in vial transport medium  Result Value Ref Range Status   SARS Coronavirus 2 by RT PCR NEGATIVE NEGATIVE Final    Comment: (NOTE) SARS-CoV-2 target nucleic acids are NOT DETECTED.  The SARS-CoV-2 RNA is generally detectable in upper respiratory specimens during the acute phase of infection. The lowest concentration of SARS-CoV-2 viral copies this assay can detect is 138 copies/mL. A negative result does not preclude SARS-Cov-2 infection and should not be used as the sole basis for treatment or other patient management decisions. A negative result may occur with  improper specimen collection/handling, submission of specimen other than nasopharyngeal swab, presence of viral mutation(s) within the areas targeted by this assay, and inadequate number of viral copies(<138 copies/mL). A negative result must be combined with clinical observations, patient history, and epidemiological information. The expected result is Negative.  Fact Sheet for Patients:  EntrepreneurPulse.com.au  Fact Sheet for Healthcare Providers:  IncredibleEmployment.be  This test is no t yet approved or cleared by the [place] FDA and  has been authorized for  detection and/or diagnosis of SARS-CoV-2 by FDA under an Emergency Use Authorization (EUA). This EUA will remain  in effect (meaning this test can be used) for the duration of the COVID-19 declaration under Section 564(b)(1) of the Act, 21 U.S.C.section 360bbb-3(b)(1), unless the authorization is terminated  or revoked sooner.       Influenza A by PCR NEGATIVE NEGATIVE Final   Influenza B by PCR NEGATIVE NEGATIVE Final    Comment: (NOTE) The Xpert Xpress SARS-CoV-2/FLU/RSV plus assay is intended as an aid in the diagnosis of influenza from Nasopharyngeal swab specimens and should not be used as a sole basis for treatment. Nasal washings and aspirates are unacceptable for Xpert Xpress SARS-CoV-2/FLU/RSV testing.  Fact Sheet for Patients: EntrepreneurPulse.com.au  Fact Sheet for Healthcare Providers: IncredibleEmployment.be  This test is not yet approved or cleared by the [place] FDA and has been authorized for detection  and/or diagnosis of SARS-CoV-2 by FDA under an Emergency Use Authorization (EUA). This EUA will remain in effect (meaning this test can be used) for the duration of the COVID-19 declaration under Section 564(b)(1) of the Act, 21 U.S.C. section 360bbb-3(b)(1), unless the authorization is terminated or revoked.  Performed at [facility], [address] [place], [place] [address]   Blood Culture (routine x 2)     Status: Abnormal   Collection Time: 11/14/20  3:49 PM   Specimen: BLOOD  Result Value Ref Range Status   Specimen Description   Final    BLOOD LEFT ANTECUBITAL Performed at [facility], [address], [place], [place] [address]    Special Requests   Final    BOTTLES DRAWN AEROBIC AND ANAEROBIC Blood Culture adequate volume Performed at [facility], [address], [place], [place] [address]    Culture  Setup Time   Final    GRAM NEGATIVE RODS IN BOTH AEROBIC AND ANAEROBIC  BOTTLES CRITICAL RESULT CALLED TO, READ BACK BY AND VERIFIED WITH: Tana Felts RN [id number] 11/15/20 HNM Performed at [facility], [place] [address] [place], [place] [address]    Culture KLEBSIELLA PNEUMONIAE (A)  Final   Report Status 11/17/2020 FINAL  Final   Organism ID, Bacteria KLEBSIELLA PNEUMONIAE  Final      Susceptibility   Klebsiella pneumoniae - MIC*    AMPICILLIN RESISTANT Resistant     CEFAZOLIN <=4 SENSITIVE Sensitive     CEFEPIME <=0.12 SENSITIVE Sensitive     CEFTAZIDIME <=1 SENSITIVE Sensitive     CEFTRIAXONE <=0.25 SENSITIVE Sensitive     CIPROFLOXACIN <=0.25 SENSITIVE Sensitive     GENTAMICIN <=1 SENSITIVE Sensitive     IMIPENEM <=0.25 SENSITIVE Sensitive     TRIMETH/SULFA <=20 SENSITIVE Sensitive     AMPICILLIN/SULBACTAM <=2 SENSITIVE Sensitive     PIP/TAZO <=4 SENSITIVE Sensitive     * KLEBSIELLA PNEUMONIAE  Blood Culture ID Panel (Reflexed)     Status: Abnormal   Collection Time: 11/14/20  3:49 PM  Result Value Ref Range Status   Enterococcus faecalis NOT DETECTED NOT DETECTED Final   Enterococcus Faecium NOT DETECTED NOT DETECTED Final   Listeria monocytogenes NOT DETECTED NOT DETECTED Final   Staphylococcus species NOT DETECTED NOT DETECTED Final   Staphylococcus aureus (BCID) NOT DETECTED NOT DETECTED Final   Staphylococcus epidermidis NOT DETECTED NOT DETECTED Final   Staphylococcus lugdunensis NOT DETECTED NOT DETECTED Final   Streptococcus species NOT DETECTED NOT DETECTED Final   Streptococcus agalactiae NOT DETECTED NOT DETECTED Final   Streptococcus pneumoniae NOT DETECTED NOT DETECTED Final   Streptococcus pyogenes NOT DETECTED NOT DETECTED Final   A.calcoaceticus-baumannii NOT DETECTED NOT DETECTED Final   Bacteroides fragilis NOT DETECTED NOT DETECTED Final   Enterobacterales DETECTED (A) NOT DETECTED Final    Comment: Enterobacterales represent a large order of gram negative bacteria, not a single organism. CRITICAL RESULT CALLED TO, READ  BACK BY AND VERIFIED WITH: Tana Felts RN [id number] 11/15/20 HNM    Enterobacter cloacae complex NOT DETECTED NOT DETECTED Final   Escherichia coli NOT DETECTED NOT DETECTED Final   Klebsiella aerogenes NOT DETECTED NOT DETECTED Final   Klebsiella oxytoca NOT DETECTED NOT DETECTED Final   Klebsiella pneumoniae DETECTED (A) NOT DETECTED Final    Comment: CRITICAL RESULT CALLED TO, READ BACK BY AND VERIFIED WITH: Tana Felts RN [id number] 11/15/20 HNM    Proteus species NOT DETECTED NOT DETECTED Final   Salmonella species NOT DETECTED  NOT DETECTED Final   Serratia marcescens NOT DETECTED NOT DETECTED Final   Haemophilus influenzae NOT DETECTED NOT DETECTED Final   Neisseria meningitidis NOT DETECTED NOT DETECTED Final   Pseudomonas aeruginosa NOT DETECTED NOT DETECTED Final   Stenotrophomonas maltophilia NOT DETECTED NOT DETECTED Final   Candida albicans NOT DETECTED NOT DETECTED Final   Candida auris NOT DETECTED NOT DETECTED Final   Candida glabrata NOT DETECTED NOT DETECTED Final   Candida krusei NOT DETECTED NOT DETECTED Final   Candida parapsilosis NOT DETECTED NOT DETECTED Final   Candida tropicalis NOT DETECTED NOT DETECTED Final   Cryptococcus neoformans/gattii NOT DETECTED NOT DETECTED Final   CTX-M ESBL NOT DETECTED NOT DETECTED Final   Carbapenem resistance IMP NOT DETECTED NOT DETECTED Final   Carbapenem resistance KPC NOT DETECTED NOT DETECTED Final   Carbapenem resistance NDM NOT DETECTED NOT DETECTED Final   Carbapenem resist OXA 48 LIKE NOT DETECTED NOT DETECTED Final   Carbapenem resistance VIM NOT DETECTED NOT DETECTED Final    Comment: Performed at [facility], [address], [place], [place] [address]  Urine culture     Status: None   Collection Time: 11/15/20  4:20 AM   Specimen: In/Out Cath Urine  Result Value Ref Range Status   Specimen Description   Final    IN/OUT CATH URINE Performed at [facility], [address], [place], [place]  [address]    Special Requests   Final    NONE Performed at [facility], [address], [place], [place] [address]    Culture   Final    NO GROWTH Performed at [facility], [address], [place], [place] [address]    Report Status 11/16/2020 FINAL  Final  CULTURE, BLOOD (ROUTINE X 2) w Reflex to ID Panel     Status: None (Preliminary result)   Collection Time: 11/17/20  1:00 PM   Specimen: BLOOD  Result Value Ref Range Status   Specimen Description BLOOD RIGHT ANTECUBITAL  Final   Special Requests   Final    BOTTLES DRAWN AEROBIC AND ANAEROBIC Blood Culture adequate volume   Culture   Final    NO GROWTH 2 DAYS Performed at [facility], [address], [place], [place] [address]    Report Status PENDING  Incomplete  CULTURE, BLOOD (ROUTINE X 2) w Reflex to ID Panel     Status: None (Preliminary result)   Collection Time: 11/17/20  1:00 PM   Specimen: BLOOD  Result Value Ref Range Status   Specimen Description BLOOD BLOOD LEFT HAND  Final   Special Requests   Final    BOTTLES DRAWN AEROBIC AND ANAEROBIC Blood Culture adequate volume   Culture   Final    NO GROWTH 2 DAYS Performed at [facility], [address] [place], [place] [address]    Report Status PENDING  Incomplete    Medications: Scheduled Meds: . Chlorhexidine Gluconate Cloth  6 each Topical Daily  . dexamethasone (DECADRON) injection  4 mg Intravenous Q8H   Followed by  . [START ON 11/20/2020] dexamethasone  2 mg Oral Q8H   Followed by  . [START ON 11/22/2020] dexamethasone  2 mg Oral Daily  . finasteride  5 mg Oral Daily  . gabapentin  300 mg Oral QHS  . insulin aspart  0-15 Units Subcutaneous TID WC  . insulin aspart  0-5 Units Subcutaneous QHS  . [START ON 11/20/2020] insulin glargine  25 Units Subcutaneous Daily  .  tamsulosin  0.4 mg Oral QPC breakfast   Continuous Infusions: . sodium chloride    .  ceFAZolin (ANCEF) IV    . levETIRAcetam 500 mg (11/19/20 0225)    PRN Meds:.iohexol, ondansetron (ZOFRAN) IV  No Known Allergies  Urinalysis: No results for input(s): COLORURINE, LABSPEC, PHURINE, GLUCOSEU, HGBUR, BILIRUBINUR, KETONESUR, PROTEINUR, UROBILINOGEN, NITRITE, LEUKOCYTESUR in the last 72 hours.  Invalid input(s): APPERANCEUR    Imaging: No results found.    Assessment/Plan:  ELISE GLADDEN is a 70 y.o. 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  Baseline creatinine of 1.55/GFR 49 from July 01, 2020 Urinalysis: 11/15/2020: Glucosuria, large hemoglobin, negative for protein Imaging: No recent renal imaging available for review Lab Results  Component Value Date   CREATININE 4.09 (H) 11/19/2020   CREATININE 1.77 (H) 11/15/2020   CREATININE 2.18 (H) 11/14/2020     Bedside bladder scan shows greater than 1000 cc of urine.  Bladder palpable in the lower abdomen   Patient likely has urinary retention causing AKI  Plan: -We will order Foley catheter placement -Monitor creatinine closely  #Hematuria We will need renal ultrasound tomorrow  Discussed with patient and his wife.  They have indicated that due to his terminal illness, they do not want to escalate care to dialysis.  Hopefully with relief of urinary retention, serum creatinine will start to improve   Harmeet Candiss Norse 11/19/20

## 2020-11-19 NOTE — TOC Initial Note (Signed)
Transition of Care [facility]) - Initial/Assessment Note    Patient Details  Name: Melvin Willis MRN: [id number] Date of Birth: Jul 26, 1951  Transition of [facility]) CM/SW Contact:    Su Hilt, RN Phone Number: 11/19/2020, 2:39 PM  Clinical Narrative:     Met with the patient's wife Lattie Haw in the Room at the bedside,The patient is sleeping Spouse stated that the patient has a cane at home, He will need a rolling walker CM reached out to the PT to inquire about recommendations, the Spouse is concerned that he has not been up walking.  Awaiting PT notes to determine needs for DC Set up a rolling walker thru [employer]   Expected Discharge Plan: [place] Barriers to Discharge: Continued Medical Work up   Patient Goals and CMS Choice Patient states their goals for this hospitalization and ongoing recovery are:: get well      Expected Discharge Plan and Services Expected Discharge Plan: [place]   Discharge Planning Services: CM Consult   Living arrangements for the past 2 months: Single Family Home                 DME Arranged: Walker rolling DME Agency: [employer]   [employer] Agency Contacted: [phone] Representative spoke with at DME Agency: Mardene Celeste            Prior Living Arrangements/Services Living arrangements for the past 2 months: [place] Lives with:: Spouse Patient language and need for interpreter reviewed:: Yes Do you feel safe going back to the place where you live?: Yes      Need for Family Participation in Patient Care: Yes (Comment) Care giver support system in place?: Yes (comment) Current home services: DME (cane) Criminal Activity/Legal Involvement Pertinent to Current Situation/Hospitalization: No - Comment as needed  Activities of Daily Living Home Assistive Devices/Equipment: Cane (specify quad or straight),Shower chair without back ADL Screening (condition at time of admission) Patient's  cognitive ability adequate to safely complete daily activities?: No Is the patient deaf or have difficulty hearing?: No Does the patient have difficulty seeing, even when wearing glasses/contacts?: No Does the patient have difficulty concentrating, remembering, or making decisions?: Yes Patient able to express need for assistance with ADLs?: No Does the patient have difficulty dressing or bathing?: No Independently performs ADLs?: Yes (appropriate for developmental age) Does the patient have difficulty walking or climbing stairs?: Yes Weakness of Legs: Both Weakness of Arms/Hands: Both  Permission Sought/Granted Permission sought to share information with : Case Manager,Family Supports Permission granted to share information with : Yes, Verbal Permission Granted     Permission granted to share info w AGENCY: [facility]        Emotional Assessment Appearance:: Appears stated age         Psych Involvement: No (comment)  Admission diagnosis:  Delirium [R41.0] Transaminitis [R74.01] Brain metastasis ([place]) [C79.31] Septic shock ([place]) [A41.9, R65.21] Sepsis ([place]) [A41.9] Patient Active Problem List   Diagnosis Date Noted  . Palliative care encounter   . Pneumonia due to infectious organism   . Sepsis ([place]) 11/14/2020  . Septic shock ([place]) 11/14/2020  . Acute metabolic encephalopathy 06/27/1172  . Acute respiratory failure ([place]) 11/14/2020  . Transaminitis 11/14/2020  . Supratherapeutic INR 11/14/2020  . Acute-on-chronic kidney injury ([place]) 11/14/2020  . Brain metastasis ([place]) 09/25/2019  . Atherosclerosis of aorta ([place]) 09/04/2019  . Goals of care, counseling/discussion 08/02/2019  . Primary malignant neoplasm of lung metastatic to other site (  [place]) 04/07/2019  . Chronic deep vein thrombosis (DVT) of left lower extremity ([place]) 01/05/2018  . Chronic kidney disease (CKD), stage III (moderate) ([place]) 11/27/2016  . Old cerebrovascular accident (CVA) without late effect   . Blurred  vision, bilateral 06/23/2016  . Primary cancer of left upper lobe of lung ([place]) 06/12/2016  . Mediastinal mass   . History of nephrolithiasis 03/27/2016  . Pharyngeal dysphagia 01/01/2016  . Benign neoplasm of ascending colon   . Benign neoplasm of sigmoid colon   . Benign neoplasm of transverse colon   . Diverticulosis of large intestine without diverticulitis   . Overweight (BMI 25.0-29.9) 04/16/2015  . Type 2 diabetes mellitus with stage 3 chronic kidney disease ([place]) 03/18/2015  . Dyslipidemia 03/18/2015  . Disorder of male genital organ 08/08/2012  . 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 Elevated PSA 08/08/2012   PCP:  [name], MD Pharmacy:   [facility] [address], [place] - [place] [address] [place] [address] Phone: [phone] Fax: [phone]  [place] [address]), [place] [address] Phone: [phone] Fax: [place], [place]. [place] [address] Phone: [phone] Fax: [phone]  [place], [place] [address] [place] [address] Phone: [phone] Fax: [phone]  [facility] [place], [place] [address] Phone: [phone] Fax: [phone]     Social Determinants of Health (SDOH) Interventions    Readmission Risk Interventions No flowsheet data found.

## 2020-11-20 ENCOUNTER — Telehealth: Payer: Self-pay | Admitting: Internal Medicine

## 2020-11-20 ENCOUNTER — Telehealth: Payer: Self-pay | Admitting: Urology

## 2020-11-20 DIAGNOSIS — R7881 Bacteremia: Secondary | ICD-10-CM | POA: Diagnosis not present

## 2020-11-20 DIAGNOSIS — C3412 Malignant neoplasm of upper lobe, left bronchus or lung: Secondary | ICD-10-CM

## 2020-11-20 DIAGNOSIS — E1122 Type 2 diabetes mellitus with diabetic chronic kidney disease: Secondary | ICD-10-CM | POA: Diagnosis not present

## 2020-11-20 DIAGNOSIS — A419 Sepsis, unspecified organism: Secondary | ICD-10-CM | POA: Diagnosis not present

## 2020-11-20 DIAGNOSIS — N179 Acute kidney failure, unspecified: Secondary | ICD-10-CM | POA: Diagnosis not present

## 2020-11-20 DIAGNOSIS — Z515 Encounter for palliative care: Secondary | ICD-10-CM | POA: Diagnosis not present

## 2020-11-20 DIAGNOSIS — R7401 Elevation of levels of liver transaminase levels: Secondary | ICD-10-CM | POA: Diagnosis not present

## 2020-11-20 DIAGNOSIS — N390 Urinary tract infection, site not specified: Secondary | ICD-10-CM

## 2020-11-20 DIAGNOSIS — R410 Disorientation, unspecified: Secondary | ICD-10-CM

## 2020-11-20 DIAGNOSIS — C7931 Secondary malignant neoplasm of brain: Secondary | ICD-10-CM | POA: Diagnosis not present

## 2020-11-20 DIAGNOSIS — R4182 Altered mental status, unspecified: Secondary | ICD-10-CM | POA: Diagnosis not present

## 2020-11-20 DIAGNOSIS — G9341 Metabolic encephalopathy: Secondary | ICD-10-CM | POA: Diagnosis not present

## 2020-11-20 LAB — CBC
HCT: 38.1 % — ABNORMAL LOW (ref 39.0–52.0)
Hemoglobin: 13.1 g/dL (ref 13.0–17.0)
MCH: 29.6 pg (ref 26.0–34.0)
MCHC: 34.4 g/dL (ref 30.0–36.0)
MCV: 86 fL (ref 80.0–100.0)
Platelets: 149 10*3/uL — ABNORMAL LOW (ref 150–400)
RBC: 4.43 MIL/uL (ref 4.22–5.81)
RDW: 15.1 % (ref 11.5–15.5)
WBC: 10.3 10*3/uL (ref 4.0–10.5)
nRBC: 0 % (ref 0.0–0.2)

## 2020-11-20 LAB — BASIC METABOLIC PANEL
Anion gap: 5 (ref 5–15)
BUN: 56 mg/dL — ABNORMAL HIGH (ref 8–23)
CO2: 20 mmol/L — ABNORMAL LOW (ref 22–32)
Calcium: 8.9 mg/dL (ref 8.9–10.3)
Chloride: 122 mmol/L — ABNORMAL HIGH (ref 98–111)
Creatinine, Ser: 2.07 mg/dL — ABNORMAL HIGH (ref 0.61–1.24)
GFR, Estimated: 34 mL/min — ABNORMAL LOW (ref >60)
Glucose, Bld: 156 mg/dL — ABNORMAL HIGH (ref 70–99)
Potassium: 4.8 mmol/L (ref 3.5–5.1)
Sodium: 147 mmol/L — ABNORMAL HIGH (ref 135–145)

## 2020-11-20 LAB — GLUCOSE, CAPILLARY
Glucose-Capillary: 164 mg/dL — ABNORMAL HIGH (ref 70–99)
Glucose-Capillary: 180 mg/dL — ABNORMAL HIGH (ref 70–99)

## 2020-11-20 LAB — LEGIONELLA PNEUMOPHILA SEROGP 1 UR AG: L. pneumophila Serogp 1 Ur Ag: NEGATIVE

## 2020-11-20 MED ORDER — DEXAMETHASONE 2 MG PO TABS: 2.0000 mg | ORAL_TABLET | Freq: Two times a day (BID) | ORAL | 0 refills | Status: DC

## 2020-11-20 MED ORDER — CEFADROXIL 500 MG PO CAPS: 500.0000 mg | ORAL_CAPSULE | Freq: Two times a day (BID) | ORAL | 0 refills | Status: AC

## 2020-11-20 MED ORDER — LEVETIRACETAM 500 MG PO TABS: 500.0000 mg | ORAL_TABLET | Freq: Two times a day (BID) | ORAL | 0 refills | Status: DC

## 2020-11-20 MED ORDER — GLIPIZIDE 5 MG PO TABS: 5.0000 mg | ORAL_TABLET | Freq: Every day | ORAL | 11 refills | Status: DC

## 2020-11-20 MED ORDER — CEFAZOLIN SODIUM-DEXTROSE 2-4 GM/100ML-% IV SOLN
2.0000 g | Freq: Three times a day (TID) | INTRAVENOUS | Status: DC
  Administered 2020-11-20: 2 g via INTRAVENOUS
  Filled 2020-11-20 (×4): qty 100

## 2020-11-20 NOTE — Telephone Encounter (Signed)
App made patient is aware michelle

## 2020-11-20 NOTE — Progress Notes (Signed)
Patient suffers from chronic Cancer and Chronic Pain which is caused by Cancer.                                                                                 (body part(s))                                                                           Hospital bed will alleviate pain by allowing upper and lower body to be positioned                                                                                                                           in ways not feasible with a normal bed. Pain episodes Frequently require                                                                                                                                                                                                     changes in body position which cannot be achieved with a normal bed

## 2020-11-20 NOTE — Telephone Encounter (Signed)
[place] - since he has multiple apts - schedule this apt on 3/25 when he comes for radiation.

## 2020-11-20 NOTE — Progress Notes (Signed)
[place]  [phone] [phone]   Name: Melvin Willis Date: 11/20/2020 MRN: [id number]  DOB: 1950/12/04  Patient Care Team: [name], [name], [name], [name], [facility] (Pharmacist)    REASON FOR CONSULTATION: Melvin Willis is a 70 y.o. 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 Patient is status post whole brain radiation and Avastin most recently on treatment with entrectinib.MRI of the brain on 07/25/2019 revealed progression of enhancing lesion in the left temporal lobe concerning for either radiation necrosis or progressive metastatic disease.   Patient was admitted to hospital 11/15/2020 with altered mental status.  CT of the brain revealed hemorrhagic metastasis with increased vasogenic edema.  MRI of the brain also showed progressive brain metastases.  CT of the chest revealed groundglass opacities in the right chest concerning for atelectasis versus early infection.  Patient was treated with sepsis with Klebsiella bacteremia.  Patient was referred to palliative care to help address goals and manage ongoing symptoms.. .    CODE STATUS: Full code  PAST MEDICAL HISTORY: Past Medical History:  Diagnosis Date  . Abnormal prostate specific antigen 08/08/2012  . Adiposity 04/16/2015  . Chronic kidney disease (CKD), stage III (moderate) ([place]) 11/27/2016  . CVA (cerebral vascular accident) ([place]) 06/17/2016  . Diabetes mellitus without complication ([place])   . Diverticulosis of sigmoid colon 04/16/2015  . Dyslipidemia 03/18/2015  . Hemorrhoids, internal 04/16/2015  . Hypercholesteremia 04/16/2015  . Hyperlipidemia   . Hypertension   . Primary  cancer of left upper lobe of lung ([place])   . Pulmonary embolism ([place])   . Wears dentures    partial upper    PAST SURGICAL HISTORY:  Past Surgical History:  Procedure Laterality Date  . COLONOSCOPY    . COLONOSCOPY WITH PROPOFOL N/A 05/06/2015   Procedure: COLONOSCOPY WITH PROPOFOL;  Surgeon: Lucilla Lame, MD;  Location: [place];  Service: Endoscopy;  Laterality: N/A;  ASCENDING COLON POLYPS X 2 TERMINAL ILEUM BIOPSY RANDOM COLON BX. TRANSVERSE COLON POLYP SIGMOID COLON POLYP  . ESOPHAGOGASTRODUODENOSCOPY (EGD) WITH PROPOFOL N/A 05/06/2015   Procedure: ESOPHAGOGASTRODUODENOSCOPY (EGD) WITH PROPOFOL;  Surgeon: Lucilla Lame, MD;  Location: [place];  Service: Endoscopy;  Laterality: N/A;  GASTRIC BIOPSY X1  . KIDNEY STONE SURGERY  2017    HEMATOLOGY/ONCOLOGY HISTORY:  Oncology History Overview Note  # OCT 2017- ADENO CA LUNG; STAGE IV [; LUL; bil supraclavicular LN; Left neck LN Bx]; ROS-1 MUTATED; s/p Carbo-alimta x1[oct 2017]  # NOV 1st 2017- XALKORI 250 mg BID; JAN 15th CT- PR;  # AUG 27th Chemo-RT to persistent LUL/mediastinal LN [s/p carbo-taxol- with RT; finished Sep 22nd 2018];   # July 14th 2020- multiple metastatic lesions of the brain [July 30 whole brain radiation finished April 21, 2019]; bone scan skull metastases /posterior left ninth rib metastases; April 04, 2019 stopped crizotinib;  #April 25, 2019-start Entrectinib 600 mg once a day; STopped in NOV 2020 [dizziness]; NOV 2020-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   # LLE DVT/bil PE/Multiple strokes [? On  xarelto]-Lovenox; Jan mid 2018- xarelto [lovenox-insurance issues]  # MRI brain- multiple infarcts [2d echo/bubble study-NEG's/p Neurology  eval]  ------------------------------------------------------------    # # s/p TURP [Sep 2017; Dr.Cope] DEC 26th CT- distended bladder;  # Elevated PSA-  FEB 2021- 12;  S/p Bx- Negative; follow with urology/Dr.Stoioff   # MOLECULAR TESTING- ROS-1 POSITIVE; ALK/EGFR-NEG; PDL-1 EXPRESSION- 90%** [HIGH]  # DEC 15th 2020- PALLIATIVE CARE -----------------------------------------------------------------    DIAGNOSIS: Adenocarcinoma the lung  ROS-1 +  STAGE:  IV    ;GOALS: Palliative  CURRENT/MOST RECENT THERAPY- Avastin [C]   Primary cancer of left upper lobe of lung ([place])  08/09/2019 -  Chemotherapy   The patient had bevacizumab-awwb (MVASI) 800 mg in sodium chloride 0.9 % 100 mL chemo infusion, 725 mg, Intravenous,  Once, 6 of 6 cycles Dose modification: 10 mg/kg (original dose 10 mg/kg, Cycle 3, Reason: Other (see comments), Comment: weight change) Administration: 800 mg (08/09/2019), 800 mg (08/22/2019), 700 mg (09/19/2019), 700 mg (10/04/2019), 700 mg (10/18/2019), 700 mg (11/01/2019)  for chemotherapy treatment.      ALLERGIES:  has No Known Allergies.  MEDICATIONS:  Current Facility-Administered Medications  Medication Dose Route Frequency Provider Last Rate Last Admin  . 0.9 %  sodium chloride infusion   Intravenous Continuous [name], MD   Stopped at 11/20/20 1226  . ceFAZolin (ANCEF) IVPB 2g/100 mL premix  2 g Intravenous Q8H [name], MD   Stopped at 11/20/20 1255  . Chlorhexidine Gluconate Cloth 2 % PADS 6 each  6 each Topical Daily [name], MD   6 each at 11/20/20 0856  . dexamethasone (DECADRON) tablet 2 mg  2 mg Oral Q8H Fritzi Mandes, MD   2 mg at 11/20/20 1228   Followed by  . [START ON 11/22/2020] dexamethasone (DECADRON) tablet 2 mg  2 mg Oral Daily Fritzi Mandes, MD      . finasteride (PROSCAR) tablet 5 mg  5 mg Oral Daily Fritzi Mandes, MD   5 mg at 11/20/20 0854  . gabapentin (NEURONTIN) capsule 300 mg  300 mg Oral QHS Tu, Ching T, DO   300 mg at 11/19/20 2124   . insulin aspart (novoLOG) injection 0-15 Units  0-15 Units Subcutaneous TID WC Tu, Ching T, DO   3 Units at 11/20/20 1227  . insulin aspart (novoLOG) injection 0-5 Units  0-5 Units Subcutaneous QHS Tu, Ching T, DO   4 Units at 11/18/20 2056  . insulin glargine (LANTUS) injection 25 Units  25 Units Subcutaneous Daily [name], MD   25 Units at 11/20/20 0856  . iohexol (OMNIPAQUE) 350 MG/ML injection 60 mL  60 mL Intravenous Once PRN Delman Kitten, MD      . levETIRAcetam (KEPPRA) IVPB 500 mg/100 mL premix  500 mg Intravenous Q12H Tu, Ching T, DO   Stopped at 11/20/20 0245  . ondansetron (ZOFRAN) injection 4 mg  4 mg Intravenous Q4H PRN Fritzi Mandes, MD   4 mg at 11/16/20 1439  . tamsulosin (FLOMAX) capsule 0.4 mg  0.4 mg Oral QPC breakfast Fritzi Mandes, MD   0.4 mg at 11/20/20 0854    VITAL SIGNS: BP (!) 148/71 (BP Location: Right Arm)   Pulse (!) 52   Temp 98.4 F (36.9 C)   Resp 16   Ht 5' 4" (1.626 m)   Wt 167 lb 1.7 oz (75.8 kg)   SpO2 100%   BMI 28.68 kg/m  Filed Weights   11/14/20 1538  Weight: 167 lb 1.7 oz (75.8 kg)  Estimated body mass index is 28.68 kg/m as calculated from the following:   Height as of this encounter: 5' 4" (1.626 m).   Weight as of this encounter: 167 lb 1.7 oz (75.8 kg).  LABS: CBC:    Component Value Date/Time   WBC 10.3 11/20/2020 0505   HGB 13.1 11/20/2020 0505   HGB 14.1 01/01/2016 1000   HCT 38.1 (L) 11/20/2020 0505   HCT 42.7 01/01/2016 1000   PLT 149 (L) 11/20/2020 0505   PLT 307 01/01/2016 1000   MCV 86.0 11/20/2020 0505   MCV 86 01/01/2016 1000   NEUTROABS 7.6 11/14/2020 1453   NEUTROABS 4.6 01/01/2016 1000   LYMPHSABS 0.4 (L) 11/14/2020 1453   LYMPHSABS 1.6 01/01/2016 1000   MONOABS 0.6 11/14/2020 1453   EOSABS 0.0 11/14/2020 1453   EOSABS 0.3 01/01/2016 1000   BASOSABS 0.0 11/14/2020 1453   BASOSABS 0.0 01/01/2016 1000   Comprehensive Metabolic Panel:    Component Value Date/Time   NA 147 (H) 11/20/2020 0505   NA 139  01/01/2016 1000   K 4.8 11/20/2020 0505   CL 122 (H) 11/20/2020 0505   CO2 20 (L) 11/20/2020 0505   BUN 56 (H) 11/20/2020 0505   BUN 13 01/01/2016 1000   CREATININE 2.07 (H) 11/20/2020 0505   CREATININE 1.58 (H) 07/26/2020 1014   GLUCOSE 156 (H) 11/20/2020 0505   CALCIUM 8.9 11/20/2020 0505   AST 27 11/19/2020 0557   ALT 218 (H) 11/19/2020 0557   ALKPHOS 106 11/19/2020 0557   BILITOT 1.3 (H) 11/19/2020 0557   BILITOT 0.4 01/01/2016 1000   PROT 7.0 11/19/2020 0557   PROT 7.2 01/01/2016 1000   ALBUMIN 3.2 (L) 11/19/2020 0557   ALBUMIN 4.4 01/01/2016 1000    RADIOGRAPHIC STUDIES: CT ABDOMEN PELVIS WO CONTRAST  Result Date: 11/14/2020 CLINICAL DATA:  Lung cancer with brain metastases, febrile with acute renal failure and sepsis. 70 year old male. EXAM: CT CHEST, ABDOMEN AND PELVIS WITHOUT CONTRAST TECHNIQUE: Multidetector CT imaging of the chest, abdomen and pelvis was performed following the standard protocol without IV contrast. COMPARISON:  August 19, 2020 FINDINGS: CT CHEST FINDINGS Cardiovascular: Heart size accentuated by low lung volumes likely mildly enlarged. Three-vessel coronary artery disease. No pericardial effusion. Aortic caliber is normal. Scattered atherosclerosis. Central pulmonary vasculature with normal caliber. Limited assessment of cardiovascular structures given lack of intravenous contrast. Mediastinum/Nodes: No mediastinal lymphadenopathy. Calcified lymph nodes in the chest are unchanged about the AP window. Small pre-vascular lymph node on image 22 unchanged at 6 mm. Lungs/Pleura: Interval development of ground-glass and septal thickening in the RIGHT chest, parenchymal assessment limited by respiratory motion. No lobar level consolidative changes. Airways are patent. No pleural effusion. LEFT hilar distortion in the setting of post treatment changes in the LEFT chest unchanged from previous imaging. Musculoskeletal: See below for full musculoskeletal details. No acute  bone finding about the bony thorax. CT ABDOMEN PELVIS FINDINGS Hepatobiliary: Hepatic cysts as before. Septation in the largest with subtle calcification unchanged. Largest measuring approximately 4 cm. Partially intrahepatic gallbladder without pericholecystic stranding. No biliary duct distension. Pancreas: Pancreas without contour abnormality. Subtle stranding may be present about the head of the pancreas versus motion artifact. No gross ductal dilation. Spleen: Spleen normal size and contour. Adrenals/Urinary Tract: Adrenal glands are normal. Renal cortical scarring bilaterally. No signs of frank hydronephrosis. Urinary bladder with smooth contours. Marked prostatomegaly is similar to the prior study with impression upon the bladder base Stomach/Bowel: Stomach is under distended. Mild gastric thickening suggested. Moderate-sized  duodenal diverticulum without surrounding stranding. No sign of bowel obstruction or acute bowel process. Colonic diverticulosis. Vascular/Lymphatic: Aortic atheromatous plaque with calcification, no aneurysmal dilation of the abdominal aorta. There is no gastrohepatic or hepatoduodenal ligament lymphadenopathy. No retroperitoneal or mesenteric lymphadenopathy. No pelvic sidewall lymphadenopathy. Reproductive: Prostatomegaly as before. Other: No ascites.  No free air. Musculoskeletal: No acute bone finding. No destructive bone process. IMPRESSION: 1. Interval development of ground-glass and septal thickening in the RIGHT chest, parenchymal assessment limited by respiratory motion. Findings appear to be primarily related to atelectasis. Developing infection or even early viral or atypical process could also have this appearance. 2. Unchanged appearance of post treatment changes in the LEFT chest. 3. Subtle stranding may be present about the head of the pancreas versus motion artifact. Correlate with pancreatic enzymes. 4. Under distension versus mild gastric thickening. Could be related  to gastritis 5. Marked prostatomegaly as before. 6. Three-vessel coronary artery disease. 7. Colonic diverticulosis without evidence of acute bowel process. 8. Hepatic cysts. 9. Aortic atherosclerosis. Aortic Atherosclerosis (ICD10-I70.0). Electronically Signed   By: Zetta Bills M.D.   On: 11/14/2020 17:46   CT Head Wo Contrast  Result Date: 11/14/2020 CLINICAL DATA:  Delirium. Lung cancer with brain metastases. Lethargic and febrile with sepsis. EXAM: CT HEAD WITHOUT CONTRAST TECHNIQUE: Contiguous axial images were obtained from the base of the skull through the vertex without intravenous contrast. COMPARISON:  MRI 06/04/2020. FINDINGS: Brain: Hemorrhagic metastases in the left frontal lobe and anterior left temporal lobe. Comparison across modalities to the prior MRI is difficult, but there is suggestion of interval increase in size of these lesions which may relate to interval internal hemorrhage. There is also suggestion of increased vasogenic edema surrounding the left temporal metastasis. Additional metastases are not well characterized on this noncontrast head CT. Parieto-occipital encephalomalacia and white matter changes are grossly similar cross modalities. Vascular: Calcific atherosclerosis. No hyperdense vessel identified. Skull: No acute fracture. Sinuses/Orbits: Mild paranasal sinus mucosal thickening. Other: No mastoid effusions. IMPRESSION: 1. Comparison across modalities to the prior MRI is difficult, but there is suggestion of interval increase in size of hemorrhagic metastases in the left frontal lobe and left temporal lobe which may relate to interval internal hemorrhage. There is also suggestion of increased vasogenic edema surrounding the left temporal metastasis. An MRI with contrast could further characterize and also better evaluate for new metastases if clinically indicated. 2. Additional known metastases are poorly evaluated on this noncontrast head CT. 3. Grossly similar  parieto-occipital encephalomalacia and white matter changes. Findings discussed with Dr. Jacqualine Code Via telephone at 5:26 PM. Electronically Signed   By: Margaretha Sheffield MD   On: 11/14/2020 17:30   CT Chest Wo Contrast  Result Date: 11/14/2020 CLINICAL DATA:  Lung cancer with brain metastases, febrile with acute renal failure and sepsis. 70 year old male. EXAM: CT CHEST, ABDOMEN AND PELVIS WITHOUT CONTRAST TECHNIQUE: Multidetector CT imaging of the chest, abdomen and pelvis was performed following the standard protocol without IV contrast. COMPARISON:  August 19, 2020 FINDINGS: CT CHEST FINDINGS Cardiovascular: Heart size accentuated by low lung volumes likely mildly enlarged. Three-vessel coronary artery disease. No pericardial effusion. Aortic caliber is normal. Scattered atherosclerosis. Central pulmonary vasculature with normal caliber. Limited assessment of cardiovascular structures given lack of intravenous contrast. Mediastinum/Nodes: No mediastinal lymphadenopathy. Calcified lymph nodes in the chest are unchanged about the AP window. Small pre-vascular lymph node on image 22 unchanged at 6 mm. Lungs/Pleura: Interval development of ground-glass and septal thickening in the RIGHT chest, parenchymal assessment  limited by respiratory motion. No lobar level consolidative changes. Airways are patent. No pleural effusion. LEFT hilar distortion in the setting of post treatment changes in the LEFT chest unchanged from previous imaging. Musculoskeletal: See below for full musculoskeletal details. No acute bone finding about the bony thorax. CT ABDOMEN PELVIS FINDINGS Hepatobiliary: Hepatic cysts as before. Septation in the largest with subtle calcification unchanged. Largest measuring approximately 4 cm. Partially intrahepatic gallbladder without pericholecystic stranding. No biliary duct distension. Pancreas: Pancreas without contour abnormality. Subtle stranding may be present about the head of the pancreas  versus motion artifact. No gross ductal dilation. Spleen: Spleen normal size and contour. Adrenals/Urinary Tract: Adrenal glands are normal. Renal cortical scarring bilaterally. No signs of frank hydronephrosis. Urinary bladder with smooth contours. Marked prostatomegaly is similar to the prior study with impression upon the bladder base Stomach/Bowel: Stomach is under distended. Mild gastric thickening suggested. Moderate-sized duodenal diverticulum without surrounding stranding. No sign of bowel obstruction or acute bowel process. Colonic diverticulosis. Vascular/Lymphatic: Aortic atheromatous plaque with calcification, no aneurysmal dilation of the abdominal aorta. There is no gastrohepatic or hepatoduodenal ligament lymphadenopathy. No retroperitoneal or mesenteric lymphadenopathy. No pelvic sidewall lymphadenopathy. Reproductive: Prostatomegaly as before. Other: No ascites.  No free air. Musculoskeletal: No acute bone finding. No destructive bone process. IMPRESSION: 1. Interval development of ground-glass and septal thickening in the RIGHT chest, parenchymal assessment limited by respiratory motion. Findings appear to be primarily related to atelectasis. Developing infection or even early viral or atypical process could also have this appearance. 2. Unchanged appearance of post treatment changes in the LEFT chest. 3. Subtle stranding may be present about the head of the pancreas versus motion artifact. Correlate with pancreatic enzymes. 4. Under distension versus mild gastric thickening. Could be related to gastritis 5. Marked prostatomegaly as before. 6. Three-vessel coronary artery disease. 7. Colonic diverticulosis without evidence of acute bowel process. 8. Hepatic cysts. 9. Aortic atherosclerosis. Aortic Atherosclerosis (ICD10-I70.0). Electronically Signed   By: Zetta Bills M.D.   On: 11/14/2020 17:46   MR BRAIN W WO CONTRAST  Result Date: 11/15/2020 CLINICAL DATA:  Encephalopathy EXAM: MRI HEAD  WITHOUT AND WITH CONTRAST TECHNIQUE: Multiplanar, multiecho pulse sequences of the brain and surrounding structures were obtained without and with intravenous contrast. CONTRAST:  7.56m GADAVIST GADOBUTROL 1 MMOL/ML IV SOLN COMPARISON:  06/04/2020 FINDINGS: Brain: No acute infarct, mass effect or extra-axial collection. Multiple sites of chronic hemorrhage, greatest in the left frontal lobe and right parietal lobe. Large amount of abnormal T2-weighted signal within the white matter. There are old bilateral cerebellar infarcts. Contrast enhancing lesions are as follows: 1. Left temporal lobe 1.9 cm, previously 1.2 cm, series 21, image 11. Marked worsening of edema surrounding this lesion. 2. Left frontal lobe, 1.0 cm, previously 0.8 cm, image 15 3. Punctate midbrain lesion, unchanged, image 13 There are no new lesions identified. Vascular: Major flow voids are preserved. Skull and upper cervical spine: Normal calvarium and skull base. Visualized upper cervical spine and soft tissues are normal. Sinuses/Orbits:Right mastoid effusion. Nasopharynx is clear. Left maxillary retention cyst. Normal orbits. IMPRESSION: 1. Increased size of left temporal lobe metastatic lesion with marked worsening of surrounding edema. 2. Increased size of left frontal metastasis. 3. Unchanged appearance of punctate midbrain lesion. No new lesions. 4. Old bilateral cerebellar infarcts and findings of chronic microvascular disease. Electronically Signed   By: KUlyses JarredM.D.   On: 11/15/2020 00:00   [place]  Result Date: 11/19/2020 CLINICAL DATA:  Acute renal failure EXAM: RENAL /  URINARY TRACT ULTRASOUND COMPLETE COMPARISON:  CT 11/14/2020 FINDINGS: Right Kidney: Renal measurements: 11.4 x 5.4 x 4.7 cm = volume: 148.4 mL. Echogenicity within normal limits. No mass. Suspected mild hydronephrosis but right kidney poorly visible Left Kidney: Renal measurements: 10.2 x 5.3 x 5.1 cm = volume: 143.5 mL. Echogenicity within normal limits.  No mass. Moderate left hydronephrosis. Bladder: Decompressed by catheter Other: None. IMPRESSION: Moderate left hydronephrosis with suspected mild right hydronephrosis, appears new since CT performed 11/14/2020. Electronically Signed   By: Donavan Foil M.D.   On: 11/19/2020 19:54   US Abdomen Limited RUQ (LIVER/GB)  Result Date: 11/14/2020 CLINICAL DATA:  Right upper quadrant pain and elevated LFTs EXAM: ULTRASOUND ABDOMEN LIMITED RIGHT UPPER QUADRANT COMPARISON:  CT from earlier in the same day. FINDINGS: Gallbladder: Gallbladder is well distended without wall thickening. Multiple folds are noted similar to that seen on prior CT. No cholelithiasis is noted. Common bile duct: Diameter: 3.4 mm. Liver: Hepatic cysts are noted similar to that seen on prior CT examination. Some septation is noted similar to that noted on prior exam. No other focal abnormality is noted. Portal vein is patent on color Doppler imaging with normal direction of blood flow towards the liver. Other: None. IMPRESSION: Paddock cysts similar to that seen on prior CT examination. No mass lesion is seen. Multiple folds within the gallbladder without complicating factors. Electronically Signed   By: Inez Catalina M.D.   On: 11/14/2020 20:02    PERFORMANCE STATUS (ECOG) : 2 - Symptomatic, <50% confined to bed  Review of Systems Unless otherwise noted, a complete review of systems is negative.  Physical Exam General: NAD Cardiovascular: regular rate and rhythm Pulmonary: clear ant fields Abdomen: soft, nontender, + bowel sounds GU: Foley with clear, yellow urine Extremities: no edema, no joint deformities Skin: no rashes Neurological: Weakness but otherwise nonfocal  IMPRESSION: Routine follow-up visit.  Patient was found to have urinary retention with worsening serum creatinine.  Serum creatinine has down trended following Foley insertion.  Today, patient appears less confused.  He denies any significant symptomatic  complaints.  Wife is at bedside.  I again attempted to speak with patient and wife regarding goals and end-of-life decision-making.  Patient again told me that he does not think that he would want resuscitation but wants input from his children.  Wife attempted to call his daughter while I was in the room but was unable to reach her.  Will likely plan follow-up in the cancer center following discharge from the hospital to meet with family as a group.  PLAN: -Continue current scope of treatment -Will plan follow up in the cancer center after discharge from the hospital   Time Total: 20 minutes  Visit consisted of counseling and education dealing with the complex and emotionally intense issues of symptom management and palliative care in the setting of serious and potentially life-threatening illness.Greater than 50%  of this time was spent counseling and coordinating care related to the above assessment and plan.  Signed by: Altha Harm, PhD, NP-C

## 2020-11-20 NOTE — Discharge Instructions (Signed)
Metastatic Cancer  Metastatic cancer is cancer that has spread from the place where it started (primary site) to another part of the body. The process of cancer spreading from the primary site is called metastasis. When cancer cells metastasize, they do not change the way they look or the way they affect the body. An example is when primary lung cancer spreads to the brain. This is called metastatic lung cancer, not brain cancer. All types of cancer can spread. Some cancers are more likely to metastasize than others. The most common places that cancers metastasize to are:  Bones.  Liver.  Lungs. What are the causes? Metastasis occurs when cancer cells spread from the primary site to another part of the body. Cancer cells can spread:  Directly from one part of the body to a nearby area (local invasion).  Into a lymph vessel. Cancer cells can be carried through the lymph system to lymph nodes and other parts of the body. The lymph system is a network of vessels and nodes that help protect against infections.  Into the blood vessels. Cancer cells can be carried to other parts of the body through the bloodstream. What increases the risk? The following factors may make you more likely to develop this condition:  The type of cancer that you have.  The stage and grade of your primary cancer at the time of diagnosis.  The grade and stage of the tumor. Grading and staging predict how quickly cancer cells will grow and their chances of metastasis.  A large primary tumor.  A higher grade of tumor.  Deeper growth of tumor.  A tumor that has entered the lymph system. What are the signs or symptoms? Symptoms of this condition include:  Weakness.  Lack of energy.  Pain.  Weight loss.  Trouble breathing.  Fluid buildup in your lungs or abdomen.  Tumor growths that can be felt or seen.  An enlarged liver. Some people with this condition may have no symptoms. How is this  diagnosed? This condition may be diagnosed based on:  Your symptoms.  Physical exam. This may include: ? Blood tests to check for certain substances that are secreted by tumors (tumor markers).  Tumor markers that increase after treatment can indicate metastasis.  Tumor markers may be used to help diagnose metastasis in some cancers, such as colon and prostate cancer.  Not all cancers have tumor markers. ? Imaging studies, such as:  X-rays.  Ultrasound.  MRI.  Other imaging tests, such as CT scans, bone scans, and PET scans. ? Testing tissue that is removed from the new cancer site (biopsy). If the cells are similar to cancer cells from the primary site, this can confirm metastatic cancer. ? Testing fluid samples from the lungs, spine, or abdomen for metastatic cancer cells. How is this treated? There are many options for treating metastatic cancer. Your treatment will depend on:  The type of cancer you have.  How far your cancer has advanced.  Your general health. Treatment may not be able to cure metastatic cancer, but it can often relieve the symptoms. In many cases, you may have a combination of treatments. Options may include:  Surgery.  Medicines that kill cancer cells (chemotherapy).  High-energy rays that kill cancer cells (radiation therapy).  Targeted therapy. This targets specific parts of cancer cells and the area around them to block the growth and the spread of the cancer. Targeted therapy can help to limit the damage to healthy cells.  Hormone  therapy.  Treatments that help your body fight cancer (biologic therapy).  Medicines that help your body's disease-fighting system (immune system) fight cancer cells (immunotherapy).  Freezing cancer cells using gas or liquid that is delivered through a needle (cryoablation).  Destroying cancer cells using high-energy radio waves that are delivered through a needle-like probe (radiofrequency ablation).  A  procedure to block the artery that supplies blood to the tumor, which kills the cancer cells (embolization).  Other medicines to manage symptoms related to cancer or cancer treatments. Follow these instructions at home: Eating and drinking  Some of your treatments might affect your appetite and your ability to chew and swallow. If you are having problems eating, or if you do not have an appetite, meet with a diet and nutrition specialist (dietitian).  If you have side effects that affect eating, it may help to: ? Eat smaller meals and snacks often. ? Drink high-nutrition and high-calorie shakes or supplements. ? Eat bland and soft foods that are easy to eat. ? Avoid foods that are hot, spicy, or hard to swallow. Lifestyle  Do not drink alcohol.  Do not use any products that contain nicotine or tobacco, such as cigarettes and e-cigarettes. If you need help quitting, ask your health care provider.   General instructions  Take over-the-counter and prescription medicines only as told by your health care provider. This includes vitamins, supplements, and herbal products.  Work with your health care provider to manage any side effects of treatment.  Keep all follow-up visits as told by your health care provider. This is important. Where to find more information  American Cancer Society: www.cancer.[place] www.cancer.gov Contact a health care provider if you:  Notice that you bruise or bleed easily.  Are losing weight without trying.  Have new or increased fatigue or weakness. Get help right away if you have:  A seizure.  A sudden increase in pain.  A fever.  Shortness of breath.  Chest pain. Summary  Metastatic cancer is cancer that has spread from the place where it started (primary site) to another part of the body.  Cancer cells can spread directly from one part of the body to a nearby area, or they may spread through the lymph system or  the bloodstream.  Your risk for metastatic cancer depends on the type of cancer you have and the stage and grade of your primary cancer.  Treatment may not be able to cure metastatic cancer, but it can often relieve the symptoms. This information is not intended to replace advice given to you by your health care provider. Make sure you discuss any questions you have with your health care provider. Document Revised: 10/17/2018 Document Reviewed: 09/08/2017 Elsevier Patient Education  2021 [employer].

## 2020-11-20 NOTE — Progress Notes (Signed)
Patient is set up with [facility] services thru [facility] The WC, Bed and 3 in 1 is being delivered right now No additional needs

## 2020-11-20 NOTE — Progress Notes (Signed)
Melvin Willis   DOB:1951/01/28   BP#:[id number]    Subjective: No fever no chills.  No nausea vomiting.  No headaches.  Patient more lucid.  Objective:  Vitals:   11/20/20 1214 11/20/20 1251  BP: (!) 148/71   Pulse: (!) 49 (!) 52  Resp: 16   Temp: 98.4 F (36.9 C)   SpO2: 96% 100%     Intake/Output Summary (Last 24 hours) at 11/20/2020 2230 Last data filed at 11/20/2020 1418 Gross per 24 hour  Intake 120 ml  Output 2225 ml  Net -2105 ml    Physical Exam Constitutional:      Comments: Sitting in the bed no acute distress.  HENT:     Head: Normocephalic and atraumatic.     Mouth/Throat:     Pharynx: No oropharyngeal exudate.  Eyes:     Pupils: Pupils are equal, round, and reactive to light.  Cardiovascular:     Rate and Rhythm: Normal rate and regular rhythm.  Pulmonary:     Effort: No respiratory distress.     Breath sounds: No wheezing.     Comments: Decreased air entry bilaterally.  No wheeze or crackles. Abdominal:     General: Bowel sounds are normal. There is no distension.     Palpations: Abdomen is soft. There is no mass.     Tenderness: There is no abdominal tenderness. There is no guarding or rebound.  Musculoskeletal:        General: No tenderness. Normal range of motion.     Cervical back: Normal range of motion and neck supple.  Skin:    General: Skin is warm.  Neurological:     Mental Status: He is alert and oriented to person, place, and time.  Psychiatric:        Mood and Affect: Affect normal.      Labs:  Lab Results  Component Value Date   WBC 10.3 11/20/2020   HGB 13.1 11/20/2020   HCT 38.1 (L) 11/20/2020   MCV 86.0 11/20/2020   PLT 149 (L) 11/20/2020   NEUTROABS 7.6 11/14/2020    Lab Results  Component Value Date   NA 147 (H) 11/20/2020   K 4.8 11/20/2020   CL 122 (H) 11/20/2020   CO2 20 (L) 11/20/2020    Studies:  US RENAL  Result Date: 2020/12/10 CLINICAL DATA:  Acute renal failure EXAM: RENAL / URINARY TRACT  ULTRASOUND COMPLETE COMPARISON:  CT 11/14/2020 FINDINGS: Right Kidney: Renal measurements: 11.4 x 5.4 x 4.7 cm = volume: 148.4 mL. Echogenicity within normal limits. No mass. Suspected mild hydronephrosis but right kidney poorly visible Left Kidney: Renal measurements: 10.2 x 5.3 x 5.1 cm = volume: 143.5 mL. Echogenicity within normal limits. No mass. Moderate left hydronephrosis. Bladder: Decompressed by catheter Other: None. IMPRESSION: Moderate left hydronephrosis with suspected mild right hydronephrosis, appears new since CT performed 11/14/2020. Electronically Signed   By: Donavan Foil M.D.   On: December 10, 2020 19:54    Primary cancer of left upper lobe of lung [facility]) #70 year old male patient with a history of metastatic lung cancer to brain is currently admitted to hospital for mental status changes  #Mental status changes-likely secondary to progressive brain metastases; CT/MRI concerning for progressive disease with edema-currently on tapering dose of steroids.   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  CT chest and pelvis scan overall stable disease.  We will discontinue entrectinib- given the elevated LFTs/CNS progression.  Patient will likely need to  go on lorlatinib.   #Elevated LFTs-unclear etiology noted for obstruction noted. ? Entretinib vs others hypotension etc. no obvious evidence of metastatic disease noted-improving.HOLD entrectinib.   #Positive blood culture-Klebsiella pneumonia-on cephalosporin repeat cultures negative; plan outpatient oral cephalosporin.  Appreciate ID recommendations.  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  Continue hold Xarelto given the hemorrhagic transformation.  # acute renal failure on CKD stage III-creatinine 2.1 status post Foley catheter.;  History of BPH; follow-up with urology.  #Diabetes-poorly controlled especially on steroids.  As per primary  service-plan glipizide; hold Metformin.  #Discussed with the patient; will plan to follow-up in approximately 1 week in the cancer center BMP/CBC.  Discussed with Dr. Manuella Ghazi.  Cammie Sickle, MD 11/20/2020  10:30 PM

## 2020-11-20 NOTE — Telephone Encounter (Signed)
C-please schedule follow-up on 3/24; MD; labs- cbc/cmp. Thanks, GB

## 2020-11-20 NOTE — Telephone Encounter (Signed)
-----   Message from Abbie Sons, MD sent at 11/20/2020 12:53 PM EDT ----- Regarding: Follow-up Please schedule follow-up ~ 2 weeks with Larene Beach or Sam for cath removal/voiding trial

## 2020-11-20 NOTE — Addendum Note (Signed)
Addended by: Gloris Ham on: 11/20/2020 01:31 PM   Modules accepted: Orders

## 2020-11-20 NOTE — Progress Notes (Signed)
PHARMACY NOTE:  ANTIMICROBIAL RENAL DOSAGE ADJUSTMENT  Current antimicrobial regimen includes a mismatch between antimicrobial dosage and estimated renal function.  As per policy approved by the Pharmacy & Therapeutics and Medical Executive Committees, the antimicrobial dosage will be adjusted accordingly.  Current antimicrobial dosage:  Cefazolin 2gm IV q12h  Indication: Klebsiella pneumoniae bacteremia  Renal Function:  Estimated Creatinine Clearance: 32.2 mL/min (A) (by C-G formula based on SCr of 2.07 mg/dL (H)).     Antimicrobial dosage has been changed to:  Cefazolin 2 gm IV q8h for improved Crcl >30 ml/min  Additional comments:   Thank you for allowing pharmacy to be a part of this patient's care.  [place], [facility] 11/20/2020 9:08 AM

## 2020-11-20 NOTE — Telephone Encounter (Signed)
So he is getting all the scans and radiation same day so his appt would have to be after lunch is that ok

## 2020-11-20 NOTE — Progress Notes (Signed)
[place], [place] 11/20/20  Subjective:   Hospital day # 6  Looks better today.  States he is able to eat some without nausea or vomiting.  Ate some pancakes and grits this morning.  Able to take in fluids Foley catheter was placed yesterday for urinary retention with brisk urine output Serum creatinine has improved today  Objective:  Vital signs in last 24 hours:  Temp:  [97.4 F (36.3 C)-98.2 F (36.8 C)] 98.1 F (36.7 C) (03/16 0722) Pulse Rate:  [50-65] 52 (03/16 0722) Resp:  [15-17] 16 (03/16 0722) BP: (113-141)/(61-95) 123/85 (03/16 0722) SpO2:  [96 %-100 %] 96 % (03/16 0722)  Weight change:  Filed Weights   11/14/20 1538  Weight: 75.8 kg    Intake/Output:    Intake/Output Summary (Last 24 hours) at 11/20/2020 1207 Last data filed at 11/20/2020 1051 Gross per 24 hour  Intake 120 ml  Output 4405 ml  Net -4285 ml    Physical Exam: General:  No acute distress, laying in the bed  HEENT  anicteric, moist oral mucous membrane  Pulm/lungs  normal breathing effort, lungs are clear to auscultation  CVS/Heart  regular rhythm, no rub or gallop  Abdomen:   Soft, nontender  Extremities:  + peripheral edema  Neurologic:  Alert, oriented, able to follow commands  Skin:  No acute rashes  Foley catheter in place  Basic Metabolic Panel:  Recent Labs  Lab 11/14/20 1453 11/15/20 0420 11/19/20 0557 11/20/20 0505  NA 135 137 141 147*  K 5.0 4.3 5.0 4.8  CL 101 106 116* 122*  CO2 21* 20* 16* 20*  GLUCOSE 252* 285* 271* 156*  BUN 31* 29* 84* 56*  CREATININE 2.18* 1.77* 4.09* 2.07*  CALCIUM 9.5 9.1 9.1 8.9     CBC: Recent Labs  Lab 11/14/20 1453 11/15/20 0420 11/19/20 0557 11/20/20 0505  WBC 8.6 9.8 11.5* 10.3  NEUTROABS 7.6  --   --   --   HGB 14.9 14.4 14.4 13.1  HCT 44.0 42.9 42.1 38.1*  MCV 87.5 88.3 86.6 86.0  PLT 205 184 169 149*      Lab Results  Component Value Date   HEPBSAG NON REACTIVE 11/14/2020   HEPBIGM  NON REACTIVE 11/14/2020      Microbiology:  Recent Results (from the past 240 hour(s))  Blood Culture (routine x 2)     Status: Abnormal   Collection Time: 11/14/20  2:53 PM   Specimen: BLOOD  Result Value Ref Range Status   Specimen Description   Final    BLOOD RIGHT ANTECUBITAL Performed at [facility], [address], [place], [place] [address]    Special Requests   Final    BOTTLES DRAWN AEROBIC AND ANAEROBIC Blood Culture adequate volume Performed at [facility], [address], [place], [place] [address]    Culture  Setup Time   Final    GRAM NEGATIVE RODS IN BOTH AEROBIC AND ANAEROBIC BOTTLES CRITICAL VALUE NOTED.  VALUE IS CONSISTENT WITH PREVIOUSLY REPORTED AND CALLED VALUE. Performed at [facility], [address] [place], [place] [address]    Culture (A)  Final    KLEBSIELLA PNEUMONIAE SUSCEPTIBILITIES PERFORMED ON PREVIOUS CULTURE WITHIN THE LAST 5 DAYS. Performed at [facility], [place] [address], [place], [place] [address]    Report Status 11/17/2020 FINAL  Final  Resp Panel by RT-PCR (Flu A&B, Covid) Nasopharyngeal Swab     Status: None   Collection Time: 11/14/20  3:49 PM   Specimen: Nasopharyngeal Swab; Nasopharyngeal(NP) swabs in vial transport medium  Result Value Ref Range Status   SARS Coronavirus 2 by RT PCR NEGATIVE NEGATIVE Final    Comment: (NOTE) SARS-CoV-2 target nucleic acids are NOT DETECTED.  The SARS-CoV-2 RNA is generally detectable in upper respiratory specimens during the acute phase of infection. The lowest concentration of SARS-CoV-2 viral copies this assay can detect is 138 copies/mL. A negative result does not preclude SARS-Cov-2 infection and should not be used as the sole basis for treatment or other patient management decisions. A negative result may occur with  improper specimen collection/handling, submission of specimen other than nasopharyngeal swab, presence of viral mutation(s) within  the areas targeted by this assay, and inadequate number of viral copies(<138 copies/mL). A negative result must be combined with clinical observations, patient history, and epidemiological information. The expected result is Negative.  Fact Sheet for Patients:  EntrepreneurPulse.com.au  Fact Sheet for Healthcare Providers:  IncredibleEmployment.be  This test is no t yet approved or cleared by the [place] FDA and  has been authorized for detection and/or diagnosis of SARS-CoV-2 by FDA under an Emergency Use Authorization (EUA). This EUA will remain  in effect (meaning this test can be used) for the duration of the COVID-19 declaration under Section 564(b)(1) of the Act, 21 U.S.C.section 360bbb-3(b)(1), unless the authorization is terminated  or revoked sooner.       Influenza A by PCR NEGATIVE NEGATIVE Final   Influenza B by PCR NEGATIVE NEGATIVE Final    Comment: (NOTE) The Xpert Xpress SARS-CoV-2/FLU/RSV plus assay is intended as an aid in the diagnosis of influenza from Nasopharyngeal swab specimens and should not be used as a sole basis for treatment. Nasal washings and aspirates are unacceptable for Xpert Xpress SARS-CoV-2/FLU/RSV testing.  Fact Sheet for Patients: EntrepreneurPulse.com.au  Fact Sheet for Healthcare Providers: IncredibleEmployment.be  This test is not yet approved or cleared by the [place] FDA and has been authorized for detection and/or diagnosis of SARS-CoV-2 by FDA under an Emergency Use Authorization (EUA). This EUA will remain in effect (meaning this test can be used) for the duration of the COVID-19 declaration under Section 564(b)(1) of the Act, 21 U.S.C. section 360bbb-3(b)(1), unless the authorization is terminated or revoked.  Performed at [facility], [address] [place], [place] [address]   Blood Culture (routine x 2)     Status: Abnormal    Collection Time: 11/14/20  3:49 PM   Specimen: BLOOD  Result Value Ref Range Status   Specimen Description   Final    BLOOD LEFT ANTECUBITAL Performed at [facility], [address], [place], [place] [address]    Special Requests   Final    BOTTLES DRAWN AEROBIC AND ANAEROBIC Blood Culture adequate volume Performed at [facility], [address] [place], [place] [address]    Culture  Setup Time   Final    GRAM NEGATIVE RODS IN BOTH AEROBIC AND ANAEROBIC BOTTLES CRITICAL RESULT CALLED TO, READ BACK BY AND VERIFIED WITH: Tana Felts RN [id number] 11/15/20 HNM Performed at [facility], [place] [address], [place], [place] [address]    Culture KLEBSIELLA PNEUMONIAE (A)  Final   Report Status 11/17/2020 FINAL  Final   Organism ID, Bacteria KLEBSIELLA PNEUMONIAE  Final      Susceptibility   Klebsiella pneumoniae - MIC*    AMPICILLIN RESISTANT Resistant     CEFAZOLIN <=4 SENSITIVE Sensitive     CEFEPIME <=0.12  SENSITIVE Sensitive     CEFTAZIDIME <=1 SENSITIVE Sensitive     CEFTRIAXONE <=0.25 SENSITIVE Sensitive     CIPROFLOXACIN <=0.25 SENSITIVE Sensitive     GENTAMICIN <=1 SENSITIVE Sensitive     IMIPENEM <=0.25 SENSITIVE Sensitive     TRIMETH/SULFA <=20 SENSITIVE Sensitive     AMPICILLIN/SULBACTAM <=2 SENSITIVE Sensitive     PIP/TAZO <=4 SENSITIVE Sensitive     * KLEBSIELLA PNEUMONIAE  Blood Culture ID Panel (Reflexed)     Status: Abnormal   Collection Time: 11/14/20  3:49 PM  Result Value Ref Range Status   Enterococcus faecalis NOT DETECTED NOT DETECTED Final   Enterococcus Faecium NOT DETECTED NOT DETECTED Final   Listeria monocytogenes NOT DETECTED NOT DETECTED Final   Staphylococcus species NOT DETECTED NOT DETECTED Final   Staphylococcus aureus (BCID) NOT DETECTED NOT DETECTED Final   Staphylococcus epidermidis NOT DETECTED NOT DETECTED Final   Staphylococcus lugdunensis NOT DETECTED NOT DETECTED Final   Streptococcus species NOT DETECTED NOT DETECTED  Final   Streptococcus agalactiae NOT DETECTED NOT DETECTED Final   Streptococcus pneumoniae NOT DETECTED NOT DETECTED Final   Streptococcus pyogenes NOT DETECTED NOT DETECTED Final   A.calcoaceticus-baumannii NOT DETECTED NOT DETECTED Final   Bacteroides fragilis NOT DETECTED NOT DETECTED Final   Enterobacterales DETECTED (A) NOT DETECTED Final    Comment: Enterobacterales represent a large order of gram negative bacteria, not a single organism. CRITICAL RESULT CALLED TO, READ BACK BY AND VERIFIED WITH: Tana Felts RN [id number] 11/15/20 HNM    Enterobacter cloacae complex NOT DETECTED NOT DETECTED Final   Escherichia coli NOT DETECTED NOT DETECTED Final   Klebsiella aerogenes NOT DETECTED NOT DETECTED Final   Klebsiella oxytoca NOT DETECTED NOT DETECTED Final   Klebsiella pneumoniae DETECTED (A) NOT DETECTED Final    Comment: CRITICAL RESULT CALLED TO, READ BACK BY AND VERIFIED WITH: Tana Felts RN [id number] 11/15/20 HNM    Proteus species NOT DETECTED NOT DETECTED Final   Salmonella species NOT DETECTED NOT DETECTED Final   Serratia marcescens NOT DETECTED NOT DETECTED Final   Haemophilus influenzae NOT DETECTED NOT DETECTED Final   Neisseria meningitidis NOT DETECTED NOT DETECTED Final   Pseudomonas aeruginosa NOT DETECTED NOT DETECTED Final   Stenotrophomonas maltophilia NOT DETECTED NOT DETECTED Final   Candida albicans NOT DETECTED NOT DETECTED Final   Candida auris NOT DETECTED NOT DETECTED Final   Candida glabrata NOT DETECTED NOT DETECTED Final   Candida krusei NOT DETECTED NOT DETECTED Final   Candida parapsilosis NOT DETECTED NOT DETECTED Final   Candida tropicalis NOT DETECTED NOT DETECTED Final   Cryptococcus neoformans/gattii NOT DETECTED NOT DETECTED Final   CTX-M ESBL NOT DETECTED NOT DETECTED Final   Carbapenem resistance IMP NOT DETECTED NOT DETECTED Final   Carbapenem resistance KPC NOT DETECTED NOT DETECTED Final   Carbapenem resistance NDM NOT DETECTED NOT DETECTED  Final   Carbapenem resist OXA 48 LIKE NOT DETECTED NOT DETECTED Final   Carbapenem resistance VIM NOT DETECTED NOT DETECTED Final    Comment: Performed at [facility], [address] [place], [place] [address]  Urine culture     Status: None   Collection Time: 11/15/20  4:20 AM   Specimen: In/Out Cath Urine  Result Value Ref Range Status   Specimen Description   Final    IN/OUT CATH URINE Performed at [facility], [address] [place], [place] [address]    Special Requests   Final    NONE Performed at [facility], [address] [place], [place] [address]    Culture   Final    NO GROWTH Performed at [facility], [place] [address], [place], [place] [address]    Report Status 11/16/2020 FINAL  Final  CULTURE, BLOOD (ROUTINE X 2) w Reflex to ID Panel     Status: None (Preliminary result)   Collection Time: 11/17/20  1:00 PM   Specimen: BLOOD  Result Value Ref Range Status   Specimen Description BLOOD RIGHT ANTECUBITAL  Final   Special Requests   Final    BOTTLES DRAWN AEROBIC AND ANAEROBIC Blood Culture adequate volume   Culture   Final    NO GROWTH 3 DAYS Performed at [facility], [address] [place], [place] [address]    Report Status PENDING  Incomplete  CULTURE, BLOOD (ROUTINE X 2) w Reflex to ID Panel     Status: None (Preliminary result)   Collection Time: 11/17/20  1:00 PM   Specimen: BLOOD  Result Value Ref Range Status   Specimen Description BLOOD BLOOD LEFT HAND  Final   Special Requests   Final    BOTTLES DRAWN AEROBIC AND ANAEROBIC Blood Culture adequate volume   Culture   Final    NO GROWTH 3 DAYS Performed at [facility], [address], [place], [place] [address]    Report Status PENDING  Incomplete    Coagulation Studies: No results for input(s): LABPROT, INR in the last 72 hours.  Urinalysis: No results for input(s): COLORURINE, LABSPEC, PHURINE, GLUCOSEU, HGBUR, BILIRUBINUR,  KETONESUR, PROTEINUR, UROBILINOGEN, NITRITE, LEUKOCYTESUR in the last 72 hours.  Invalid input(s): APPERANCEUR    Imaging: US RENAL  Result Date: 11/19/2020 CLINICAL DATA:  Acute renal failure EXAM: RENAL / URINARY TRACT ULTRASOUND COMPLETE COMPARISON:  CT 11/14/2020 FINDINGS: Right Kidney: Renal measurements: 11.4 x 5.4 x 4.7 cm = volume: 148.4 mL. Echogenicity within normal limits. No mass. Suspected mild hydronephrosis but right kidney poorly visible Left Kidney: Renal measurements: 10.2 x 5.3 x 5.1 cm = volume: 143.5 mL. Echogenicity within normal limits. No mass. Moderate left hydronephrosis. Bladder: Decompressed by catheter Other: None. IMPRESSION: Moderate left hydronephrosis with suspected mild right hydronephrosis, appears new since CT performed 11/14/2020. Electronically Signed   By: Donavan Foil M.D.   On: 11/19/2020 19:54     Medications:   . sodium chloride 75 mL/hr at 11/19/20 1748  .  ceFAZolin (ANCEF) IV    . levETIRAcetam Stopped (11/20/20 0245)   . Chlorhexidine Gluconate Cloth  6 each Topical Daily  . dexamethasone  2 mg Oral Q8H   Followed by  . [START ON 11/22/2020] dexamethasone  2 mg Oral Daily  . finasteride  5 mg Oral Daily  . gabapentin  300 mg Oral QHS  . insulin aspart  0-15 Units Subcutaneous TID WC  . insulin aspart  0-5 Units Subcutaneous QHS  . insulin glargine  25 Units Subcutaneous Daily  . tamsulosin  0.4 mg Oral QPC breakfast   iohexol, ondansetron (ZOFRAN) IV  Assessment/ Plan:  70 y.o. 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  Baseline creatinine of 1.55/GFR  49 from July 01, 2020 Urinalysis:  11/15/2020: Glucosuria, large hemoglobin, negative for protein Imaging: No recent renal imaging available for review  Lab Results  Component Value Date   CREATININE 2.07 (H) 11/20/2020   CREATININE 4.09 (H) 11/19/2020   CREATININE 1.77 (H) 11/15/2020   03/15 0701 - 03/16 0700 In: 0  Out: 6148 [Urine:4405]   Patient already on tamsulosin and finasteride May need urology follow-up as outpatient for voiding trial Serum creatinine improving nicely as expected.  #Bilateral hydronephrosis Moderate left hydronephrosis, suspected mild right hydronephrosis new since CT of November 14, 2020 ?  If it is related to acute urinary retention -Patient will need follow-up ultrasound in 1 to 2 days  #Hematuria We will repeat urinalysis Urine protein to creatinine ratio is minimal at 0.08     LOS: 6 Harmeet Singh 3/16/202212:07 PM  [place], [place]  Note: This note was prepared with Dragon dictation. Any transcription errors are unintentional

## 2020-11-20 NOTE — Progress Notes (Signed)
Date of Admission:  11/14/2020     ID: Melvin Willis is a 70 y.o. male  Active Problems:   Type 2 diabetes mellitus with stage 3 chronic kidney disease ([place])   Primary cancer of left upper lobe of lung ([place])   Brain metastasis ([place])   Sepsis ([place])   Septic shock ([place])   Acute metabolic encephalopathy   Acute respiratory failure ([facility])   Transaminitis   Supratherapeutic INR   Acute-on-chronic kidney injury ([place])   Palliative care encounter    Subjective: Doing better   Medications:  . Chlorhexidine Gluconate Cloth  6 each Topical Daily  . dexamethasone  2 mg Oral Q8H   Followed by  . [START ON 11/22/2020] dexamethasone  2 mg Oral Daily  . finasteride  5 mg Oral Daily  . gabapentin  300 mg Oral QHS  . insulin aspart  0-15 Units Subcutaneous TID WC  . insulin aspart  0-5 Units Subcutaneous QHS  . insulin glargine  25 Units Subcutaneous Daily  . tamsulosin  0.4 mg Oral QPC breakfast    Objective: Vital signs in last 24 hours: Patient Vitals for the past 24 hrs:  BP Temp Temp src Pulse Resp SpO2  11/20/20 0722 123/85 98.1 F (36.7 C) Oral (!) 52 16 96 %  11/20/20 0500 -- -- -- (!) 57 -- --  11/20/20 0456 129/84 97.9 F (36.6 C) Oral (!) 50 16 96 %  11/20/20 0056 113/61 (!) 97.4 F (36.3 C) Oral (!) 52 15 97 %  11/19/20 2024 138/85 98.2 F (36.8 C) Oral (!) 55 17 100 %  11/19/20 1605 (!) 141/95 97.7 F (36.5 C) -- 65 16 97 %  11/19/20 1156 (!) 138/94 97.7 F (36.5 C) -- 75 18 97 %   PHYSICAL EXAM:  General: Alert, cooperative, follows commands Head: Normocephalic, without obvious abnormality, atraumatic. Eyes:rt side full range of movts restricted for lateral abduction No diplopia ENT Nares normal. No drainage or sinus tenderness. Lips, mucosa, and tongue normal. No Thrush Neck: Supple, symmetrical, no adenopathy, thyroid: non tender no carotid bruit and no JVD. Lungs: Clear to auscultation bilaterally. No Wheezing or Rhonchi. No rales. Heart:  s1s2 Abdomen: Soft, non-tender,not distended. Bowel sounds normal. No masses Extremities: left arm swollen Skin: No rashes or lesions. Or bruising Lymph: Cervical, supraclavicular normal. Neurologic: mild weakness lef upper extremity could be because of IV line and swelling  Lab Results Recent Labs    11/19/20 0557 11/20/20 0505  WBC 11.5* 10.3  HGB 14.4 13.1  HCT 42.1 38.1*  NA 141 147*  K 5.0 4.8  CL 116* 122*  CO2 16* 20*  BUN 84* 56*  CREATININE 4.09* 2.07*   Liver Panel Recent Labs    11/17/20 1406 11/19/20 0557  PROT 7.1 7.0  ALBUMIN 3.4* 3.2*  AST 64* 27  ALT 383* 218*  ALKPHOS 132* 106  BILITOT 1.2 1.3*  BILIDIR 0.3*  --   IBILI 0.9  --     Microbiology: Blood culture 11/14/2020 Klebsiella pneumonia 11/17/2020 no growth 11/14/2020 urine culture: Klebsiella pneumoniae Studies/Results: US RENAL  Result Date: 11/19/2020 CLINICAL DATA:  Acute renal failure EXAM: RENAL / URINARY TRACT ULTRASOUND COMPLETE COMPARISON:  CT 11/14/2020 FINDINGS: Right Kidney: Renal measurements: 11.4 x 5.4 x 4.7 cm = volume: 148.4 mL. Echogenicity within normal limits. No mass. Suspected mild hydronephrosis but right kidney poorly visible Left Kidney: Renal measurements: 10.2 x 5.3 x 5.1 cm = volume: 143.5 mL. Echogenicity within normal limits. No mass.  Moderate left hydronephrosis. Bladder: Decompressed by catheter Other: None. IMPRESSION: Moderate left hydronephrosis with suspected mild right hydronephrosis, appears new since CT performed 11/14/2020. Electronically Signed   By: Donavan Foil M.D.   On: 11/19/2020 19:54     Assessment/Plan: Encephalopathy resolved Klebsiella pneumonia bacteremia.  Currently on cefazolin.  Day 7 of antibiotic.  Secondary to urinary tract infection.  Has prostatomegaly and went into urinary retention.  Had to have Foley catheter placed yesterday.on discharge switch to cefadroxil for 7 more days  Abnormal LFTs.  Increased bilirubin, transaminitis and  alkaline phosphatase Could be from sepsis versus ascending cholangitis.  Much improved  AKI on CKD.  Yesterday creatinine had jumped to 4.  Now today it is down to 2 after Foley catheter placement.  Metastatic lung carcinoma with brain mets  Discussed the management with patient , wife and hospitalist. ID will sign off- call if needed

## 2020-11-21 ENCOUNTER — Telehealth: Payer: Self-pay

## 2020-11-21 NOTE — Telephone Encounter (Signed)
Transition Care Management Unsuccessful Follow-up Telephone Call  Date of discharge and from where:  11/20/20 [facility]  Attempts:  1st Attempt  Reason for unsuccessful TCM follow-up call:  Voice mail full

## 2020-11-22 DIAGNOSIS — C3432 Malignant neoplasm of lower lobe, left bronchus or lung: Secondary | ICD-10-CM | POA: Diagnosis not present

## 2020-11-22 DIAGNOSIS — E1165 Type 2 diabetes mellitus with hyperglycemia: Secondary | ICD-10-CM | POA: Diagnosis not present

## 2020-11-22 DIAGNOSIS — Z8673 Personal history of transient ischemic attack (TIA), and cerebral infarction without residual deficits: Secondary | ICD-10-CM | POA: Diagnosis not present

## 2020-11-22 DIAGNOSIS — G9341 Metabolic encephalopathy: Secondary | ICD-10-CM | POA: Diagnosis not present

## 2020-11-22 DIAGNOSIS — E1122 Type 2 diabetes mellitus with diabetic chronic kidney disease: Secondary | ICD-10-CM | POA: Diagnosis not present

## 2020-11-22 DIAGNOSIS — I10 Essential (primary) hypertension: Secondary | ICD-10-CM | POA: Diagnosis not present

## 2020-11-22 DIAGNOSIS — N1831 Chronic kidney disease, stage 3a: Secondary | ICD-10-CM | POA: Diagnosis not present

## 2020-11-22 DIAGNOSIS — Z9181 History of falling: Secondary | ICD-10-CM | POA: Diagnosis not present

## 2020-11-22 DIAGNOSIS — Z7984 Long term (current) use of oral hypoglycemic drugs: Secondary | ICD-10-CM | POA: Diagnosis not present

## 2020-11-22 DIAGNOSIS — Z794 Long term (current) use of insulin: Secondary | ICD-10-CM | POA: Diagnosis not present

## 2020-11-22 DIAGNOSIS — E7800 Pure hypercholesterolemia, unspecified: Secondary | ICD-10-CM | POA: Diagnosis not present

## 2020-11-22 DIAGNOSIS — Z960 Presence of urogenital implants: Secondary | ICD-10-CM | POA: Diagnosis not present

## 2020-11-22 DIAGNOSIS — E785 Hyperlipidemia, unspecified: Secondary | ICD-10-CM | POA: Diagnosis not present

## 2020-11-22 DIAGNOSIS — Z86711 Personal history of pulmonary embolism: Secondary | ICD-10-CM | POA: Diagnosis not present

## 2020-11-22 DIAGNOSIS — C7931 Secondary malignant neoplasm of brain: Secondary | ICD-10-CM | POA: Diagnosis not present

## 2020-11-22 DIAGNOSIS — K5730 Diverticulosis of large intestine without perforation or abscess without bleeding: Secondary | ICD-10-CM | POA: Diagnosis not present

## 2020-11-22 LAB — CULTURE, BLOOD (ROUTINE X 2)
Culture: NO GROWTH
Culture: NO GROWTH
Special Requests: ADEQUATE
Special Requests: ADEQUATE

## 2020-11-22 NOTE — Discharge Summary (Signed)
[facility] [place] NAME: Melvin Willis    MR#:  [id number]  DATE OF BIRTH:  05-15-1951  DATE OF ADMISSION:  11/14/2020   ADMITTING PHYSICIAN: Fritzi Mandes, MD  DATE OF DISCHARGE: 11/20/2020  3:30 PM  PRIMARY CARE PHYSICIAN: [name], MD   ADMISSION DIAGNOSIS:  Delirium [R41.0] Transaminitis [R74.01] Brain metastasis ([place]) [C79.31] Septic shock ([place]) [A41.9, R65.21] Sepsis ([place]) [A41.9] DISCHARGE DIAGNOSIS:  Active Problems:   Type 2 diabetes mellitus with stage 3 chronic kidney disease ([place])   Primary cancer of left upper lobe of lung ([place])   Brain metastasis (HCC)   Sepsis ([place])   Septic shock ([place])   Acute metabolic encephalopathy   Acute respiratory failure (HCC)   Transaminitis   Supratherapeutic INR   Acute-on-chronic kidney injury ([place])   Palliative care encounter   Delirium  SECONDARY DIAGNOSIS:   Past Medical History:  Diagnosis Date  . Abnormal prostate specific antigen 08/08/2012  . Adiposity 04/16/2015  . Chronic kidney disease (CKD), stage III (moderate) ([place]) 11/27/2016  . CVA (cerebral vascular accident) ([place]) 06/17/2016  . Diabetes mellitus without complication ([place])   . Diverticulosis of sigmoid colon 04/16/2015  . Dyslipidemia 03/18/2015  . Hemorrhoids, internal 04/16/2015  . Hypercholesteremia 04/16/2015  . Hyperlipidemia   . Hypertension   . Primary cancer of left upper lobe of lung ([place])   . Pulmonary embolism ([place])   . 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  Acute metabolic encephalopathy -POA, now improving --Multifactorial from worsening hemorrhagic metastasis and increased vasogenic edema, uremia as well as  sepsis  --Neurosurgery recommends--continue Keppra 500 BID for seizure prophylaxis and continue IV Decadron 4 mg QID for vasoedema with tapering dose at discharge --CT head showed changes in the size of hemorrhagic metastasis with increased vasogenic edema. --MRI brain:Increased size of left temporal lobe metastatic lesion with marked worsening of surrounding edema. Increased size of left frontal metastasis.  Severe sepsis secondary to Klebsiella pneumonia bacteremia: POA --BCID 4/4 [place]. 3/13--repeat blood cultures negative till now - being D/C on oral Abx  Acute on CKD stage IIIa/pre-renal in the setting of sepsis Post-obstructive - improving with foley and being D/C with it.  - to remain indwelling for 2 weeks and f/up with Dr Bernardo Heater in 2 weeks  Hx ofstage IV adenocarcinoma of the lung with brain metastasis --outpt f/up at cancer center  Acute hypoxic respiratory failure--resolved --Now on room air  Transaminitis/supratherapeutic INR/Hyperbilirubunemia --LFt's trending down  --US abdomen shows  cysts similar to that seen on prior CT examination. No mass lesion is seen. Multiple folds within the gallbladder without complicating factors.  Type 2 diabetes  --Hemoglobin A1c of 10.5 on 2/24  History of DVT/PE --Hold Xarelto given worsening hemorrhagic metastasis of the brain. Onco & Neurosurgery will decide when to resume as an outpt  Generalized weakness HHPT per PT, OT eval    Recommend CBC, BMP in 1 week with cancer center DISCHARGE CONDITIONS:  stable CONSULTS OBTAINED:  Treatment Team:  Tsosie Billing, MD DRUG ALLERGIES:  No Known Allergies DISCHARGE MEDICATIONS:   Allergies as of 11/20/2020   No Known Allergies     Medication List    STOP taking these medications   metFORMIN  750 MG 24 hr tablet Commonly known as: GLUCOPHAGE-XR   rivaroxaban 20 MG Tabs tablet Commonly known as: Xarelto     TAKE these medications   atorvastatin 40 MG  tablet Commonly known as: LIPITOR Take 1 tablet (40 mg total) by mouth daily.   blood glucose meter kit and supplies Dispense based on patient and insurance preference (Accuchek Aviva Plus, Accuchek Nano). Use up to four times daily as directed. (FOR ICD-10 E10.9, E11.9).   cefadroxil 500 MG capsule Commonly known as: DURICEF Take 1 capsule (500 mg total) by mouth 2 (two) times daily for 7 days.   dexamethasone 2 MG tablet Commonly known as: DECADRON Take 1 tablet (2 mg total) by mouth 2 (two) times daily for 15 days.   entrectinib 200 MG capsule Commonly known as: ROZLYTREK Take 3 capsules (600 mg total) by mouth daily. What changed: additional instructions   finasteride 5 MG tablet Commonly known as: PROSCAR Take 1 tablet (5 mg total) by mouth daily.   gabapentin 300 MG capsule Commonly known as: NEURONTIN Take 1 capsule by mouth at bedtime   glipiZIDE 5 MG tablet Commonly known as: Glucotrol Take 1 tablet (5 mg total) by mouth daily.   levETIRAcetam 500 MG tablet Commonly known as: Keppra Take 1 tablet (500 mg total) by mouth 2 (two) times daily.   Novofine Pen Needle 32G X 6 MM Misc Generic drug: Insulin Pen Needle 1 each by Does not apply route daily.   ondansetron 8 MG tablet Commonly known as: ZOFRAN One pill every 8 hours as needed for nausea/vomitting.   tamsulosin 0.4 MG Caps capsule Commonly known as: FLOMAX Take 1 capsule by mouth once daily in the morning     ASK your doctor about these medications   SOLIQUA [place] Inject 15 Units into the skin daily. Ask about: Which instructions should I use?      DISCHARGE INSTRUCTIONS:   DIET:  Cardiac diet DISCHARGE CONDITION:  Stable ACTIVITY:  Activity as tolerated OXYGEN:  Home Oxygen: No.  Oxygen Delivery: room air DISCHARGE LOCATION:  Home with HHPT, OT - Palliative care to follow   If you experience worsening of your admission symptoms, develop shortness of breath, life threatening emergency,  suicidal or homicidal thoughts you must seek medical attention immediately by calling 911 or calling your MD immediately  if symptoms less severe.  You Must read complete instructions/literature along with all the possible adverse reactions/side effects for all the Medicines you take and that have been prescribed to you. Take any new Medicines after you have completely understood and accpet all the possible adverse reactions/side effects.   Please note  You were cared for by a hospitalist during your hospital stay. If you have any questions about your discharge medications or the care you received while you were in the hospital after you are discharged, you can call the unit and asked to speak with the hospitalist on call if the hospitalist that took care of you is not available. Once you are discharged, your primary care physician will handle any further medical issues. Please note that NO REFILLS for any discharge medications will be authorized once you are discharged, as it is imperative that you return to your primary care physician (or establish a relationship with a primary care physician if you do not have one) for your aftercare needs so that they can reassess your need for medications and monitor your lab values.    On the day of Discharge:  VITAL SIGNS:  Blood pressure (!) 148/71, pulse (!) 52, temperature 98.4 F (36.9 C), resp. rate 16, height '5\' 4"'  (1.626 m), weight 75.8 kg, SpO2 100 %. PHYSICAL EXAMINATION:  GENERAL:  70 y.o.-year-old patient lying in the bed with no acute distress.  EYES: Pupils equal, round, reactive to light and accommodation. No scleral icterus. Extraocular muscles intact.  HEENT: Head atraumatic, normocephalic. Oropharynx and nasopharynx clear.  NECK:  Supple, no jugular venous distention. No thyroid enlargement, no tenderness.  LUNGS: Normal breath sounds bilaterally, no wheezing, rales,rhonchi or crepitation. No use of accessory muscles of respiration.   CARDIOVASCULAR: S1, S2 normal. No murmurs, rubs, or gallops.  ABDOMEN: Soft, non-tender, non-distended. Bowel sounds present. No organomegaly or mass.  EXTREMITIES: No pedal edema, cyanosis, or clubbing.  NEUROLOGIC: Cranial nerves II through XII are intact. Muscle strength 5/5 in all extremities. Sensation intact. Gait not checked.  PSYCHIATRIC: The patient is alert and oriented x 3.  SKIN: No obvious rash, lesion, or ulcer.  DATA REVIEW:   CBC Recent Labs  Lab 11/20/20 0505  WBC 10.3  HGB 13.1  HCT 38.1*  PLT 149*    Chemistries  Recent Labs  Lab 11/19/20 0557 11/20/20 0505  NA 141 147*  K 5.0 4.8  CL 116* 122*  CO2 16* 20*  GLUCOSE 271* 156*  BUN 84* 56*  CREATININE 4.09* 2.07*  CALCIUM 9.1 8.9  AST 27  --   ALT 218*  --   ALKPHOS 106  --   BILITOT 1.3*  --      Outpatient follow-up  Follow-up Information    [name], MD. Schedule an appointment as soon as possible for a visit in 2 weeks.   Specialty: Family Medicine Why: pt. to make on follow-up appointment. Contact information: [address] [place] [address] [phone]        Cammie Sickle, MD. Schedule an appointment as soon as possible for a visit in 1 week.   Specialties: Internal Medicine, Oncology Why: cancer center states for pt. to keep appt. that are already made. Contact information: [place] [address] [phone]        Murlean Iba, MD. Go on 12/24/2020.   Specialty: Nephrology Why: '@11' :40 Contact information: [place] [address] [phone]        Abbie Sons, MD. Go on 12/05/2020.   Specialty: Urology Why: @ 9:30  and  1:30 Contact information: [address] [place] [address] [phone]        Meade Maw, MD. Schedule an appointment as soon as possible for a visit in 1 month.   Specialty: Neurosurgery Why: The office will call to make a  follow-up. Contact information: [place] [address] [phone]               30 Day Unplanned Readmission Risk Score   Flowsheet Row ED to Hosp-Admission (Discharged) from 11/14/2020 in [facility] (1C)  30 Day Unplanned Readmission Risk Score (%) 17.8 Filed at 11/20/2020 1200     This score is the patient's risk of an unplanned readmission within 30 days of being discharged (0 -100%). The score is based on dignosis, age, lab data, medications, orders, and past utilization.   Low:  0-14.9   Medium: 15-21.9   High: 22-29.9   Extreme: 30 and above         Management plans discussed with the patient, family and they  are in agreement.  CODE STATUS: Prior   TOTAL TIME TAKING CARE OF THIS PATIENT: 45 minutes.    [name] M.D on 11/22/2020 at 7:22 PM  [facility]   CC: Primary care physician; [name], MD   Note: This dictation was prepared with Dragon dictation along with smaller phrase technology. Any transcriptional errors that result from this process are unintentional.

## 2020-11-24 DIAGNOSIS — N183 Chronic kidney disease, stage 3 unspecified: Secondary | ICD-10-CM | POA: Diagnosis not present

## 2020-11-24 DIAGNOSIS — Z833 Family history of diabetes mellitus: Secondary | ICD-10-CM | POA: Diagnosis not present

## 2020-11-24 DIAGNOSIS — Z510 Encounter for antineoplastic radiation therapy: Secondary | ICD-10-CM | POA: Diagnosis not present

## 2020-11-24 DIAGNOSIS — E1122 Type 2 diabetes mellitus with diabetic chronic kidney disease: Secondary | ICD-10-CM | POA: Diagnosis not present

## 2020-11-24 DIAGNOSIS — Z8719 Personal history of other diseases of the digestive system: Secondary | ICD-10-CM | POA: Diagnosis not present

## 2020-11-24 DIAGNOSIS — C7931 Secondary malignant neoplasm of brain: Secondary | ICD-10-CM | POA: Diagnosis present

## 2020-11-24 DIAGNOSIS — C778 Secondary and unspecified malignant neoplasm of lymph nodes of multiple regions: Secondary | ICD-10-CM | POA: Diagnosis not present

## 2020-11-24 DIAGNOSIS — R413 Other amnesia: Secondary | ICD-10-CM | POA: Diagnosis not present

## 2020-11-24 DIAGNOSIS — Z86711 Personal history of pulmonary embolism: Secondary | ICD-10-CM | POA: Diagnosis not present

## 2020-11-24 DIAGNOSIS — I82402 Acute embolism and thrombosis of unspecified deep veins of left lower extremity: Secondary | ICD-10-CM | POA: Diagnosis not present

## 2020-11-24 DIAGNOSIS — Z79899 Other long term (current) drug therapy: Secondary | ICD-10-CM | POA: Diagnosis not present

## 2020-11-24 DIAGNOSIS — Z794 Long term (current) use of insulin: Secondary | ICD-10-CM | POA: Diagnosis not present

## 2020-11-24 DIAGNOSIS — Z6828 Body mass index (BMI) 28.0-28.9, adult: Secondary | ICD-10-CM | POA: Diagnosis not present

## 2020-11-24 DIAGNOSIS — N401 Enlarged prostate with lower urinary tract symptoms: Secondary | ICD-10-CM | POA: Diagnosis not present

## 2020-11-24 DIAGNOSIS — Z8042 Family history of malignant neoplasm of prostate: Secondary | ICD-10-CM | POA: Diagnosis not present

## 2020-11-24 DIAGNOSIS — Z818 Family history of other mental and behavioral disorders: Secondary | ICD-10-CM | POA: Diagnosis not present

## 2020-11-24 DIAGNOSIS — Z8249 Family history of ischemic heart disease and other diseases of the circulatory system: Secondary | ICD-10-CM | POA: Diagnosis not present

## 2020-11-24 DIAGNOSIS — M2550 Pain in unspecified joint: Secondary | ICD-10-CM | POA: Diagnosis not present

## 2020-11-24 DIAGNOSIS — R5383 Other fatigue: Secondary | ICD-10-CM | POA: Diagnosis not present

## 2020-11-24 DIAGNOSIS — E785 Hyperlipidemia, unspecified: Secondary | ICD-10-CM | POA: Diagnosis not present

## 2020-11-24 DIAGNOSIS — C3412 Malignant neoplasm of upper lobe, left bronchus or lung: Secondary | ICD-10-CM | POA: Diagnosis not present

## 2020-11-25 ENCOUNTER — Telehealth: Payer: Self-pay | Admitting: Family Medicine

## 2020-11-25 DIAGNOSIS — C3432 Malignant neoplasm of lower lobe, left bronchus or lung: Secondary | ICD-10-CM | POA: Diagnosis not present

## 2020-11-25 DIAGNOSIS — G9341 Metabolic encephalopathy: Secondary | ICD-10-CM | POA: Diagnosis not present

## 2020-11-25 DIAGNOSIS — E1122 Type 2 diabetes mellitus with diabetic chronic kidney disease: Secondary | ICD-10-CM | POA: Diagnosis not present

## 2020-11-25 DIAGNOSIS — N1831 Chronic kidney disease, stage 3a: Secondary | ICD-10-CM | POA: Diagnosis not present

## 2020-11-25 DIAGNOSIS — C7931 Secondary malignant neoplasm of brain: Secondary | ICD-10-CM | POA: Diagnosis not present

## 2020-11-25 DIAGNOSIS — E1165 Type 2 diabetes mellitus with hyperglycemia: Secondary | ICD-10-CM | POA: Diagnosis not present

## 2020-11-25 NOTE — Telephone Encounter (Signed)
Gl[facility] Buff PT with [facility] is calling and needs verbal plan of care order for physical therapy 2x6 and then 1x2

## 2020-11-26 ENCOUNTER — Telehealth: Payer: Self-pay | Admitting: Family Medicine

## 2020-11-26 NOTE — Telephone Encounter (Signed)
Spoke with Cecille Rubin and gave requested verbal orders for patient per Dr. Ancil Boozer.

## 2020-11-26 NOTE — Telephone Encounter (Signed)
Melvin Willis from [facility] is calling,  stating to inform Dr Ancil Boozer that the pt declined OT. Sherlynn Stalls states no further CB is needed. She is just relaying the decline on 3/22.

## 2020-11-26 NOTE — Telephone Encounter (Signed)
Tried calling the physical therapist Glory Buff with [facility] to give verbal orders as requested but the phone given [phone] is not the correct number.

## 2020-11-26 NOTE — Telephone Encounter (Signed)
Home Health Verbal Orders - Caller/Agency: Cecille Rubin with [facility]  Callback Number: [phone]  Requesting OT/PT/Skilled Nursing/Social Work/Speech Therapy////: Nursing orders 1 W 9 and 2 prn

## 2020-11-27 ENCOUNTER — Ambulatory Visit
Admission: RE | Admit: 2020-11-27 | Discharge: 2020-11-27 | Disposition: A | Discharge status: Home / Self Care | Payer: Medicare Other | Source: Ambulatory Visit | Attending: Radiation Oncology | Admitting: Radiation Oncology

## 2020-11-27 ENCOUNTER — Telehealth: Payer: Self-pay | Admitting: Family Medicine

## 2020-11-27 DIAGNOSIS — G9341 Metabolic encephalopathy: Secondary | ICD-10-CM | POA: Diagnosis not present

## 2020-11-27 DIAGNOSIS — N401 Enlarged prostate with lower urinary tract symptoms: Secondary | ICD-10-CM | POA: Diagnosis not present

## 2020-11-27 DIAGNOSIS — E1122 Type 2 diabetes mellitus with diabetic chronic kidney disease: Secondary | ICD-10-CM | POA: Diagnosis not present

## 2020-11-27 DIAGNOSIS — C3432 Malignant neoplasm of lower lobe, left bronchus or lung: Secondary | ICD-10-CM | POA: Diagnosis not present

## 2020-11-27 DIAGNOSIS — E1165 Type 2 diabetes mellitus with hyperglycemia: Secondary | ICD-10-CM | POA: Diagnosis not present

## 2020-11-27 DIAGNOSIS — C7931 Secondary malignant neoplasm of brain: Secondary | ICD-10-CM | POA: Diagnosis not present

## 2020-11-27 DIAGNOSIS — Z510 Encounter for antineoplastic radiation therapy: Secondary | ICD-10-CM | POA: Diagnosis not present

## 2020-11-27 DIAGNOSIS — R5383 Other fatigue: Secondary | ICD-10-CM | POA: Diagnosis not present

## 2020-11-27 DIAGNOSIS — M2550 Pain in unspecified joint: Secondary | ICD-10-CM | POA: Diagnosis not present

## 2020-11-27 DIAGNOSIS — C3412 Malignant neoplasm of upper lobe, left bronchus or lung: Secondary | ICD-10-CM | POA: Diagnosis not present

## 2020-11-27 DIAGNOSIS — N1831 Chronic kidney disease, stage 3a: Secondary | ICD-10-CM | POA: Diagnosis not present

## 2020-11-27 DIAGNOSIS — C778 Secondary and unspecified malignant neoplasm of lymph nodes of multiple regions: Secondary | ICD-10-CM | POA: Diagnosis not present

## 2020-11-27 NOTE — Telephone Encounter (Signed)
Appointment today was cancelled per family's request   [place] Orders - Caller/Agency: [place] [facility] Number: [phone] Requesting OT/PT/Skilled Nursing/Social Work/Speech Therapy:  Frequency:

## 2020-11-28 ENCOUNTER — Inpatient Hospital Stay: Admission: RE | Admit: 2020-11-28 | Payer: Medicare Other | Source: Ambulatory Visit

## 2020-11-28 ENCOUNTER — Ambulatory Visit: Payer: Medicare Other

## 2020-11-28 ENCOUNTER — Ambulatory Visit
Admission: RE | Admit: 2020-11-28 | Discharge: 2020-11-28 | Disposition: A | Discharge status: Home / Self Care | Payer: Medicare Other | Source: Ambulatory Visit | Attending: Internal Medicine | Admitting: Internal Medicine

## 2020-11-28 ENCOUNTER — Ambulatory Visit
Admission: RE | Admit: 2020-11-28 | Discharge: 2020-11-28 | Disposition: A | Discharge status: Home / Self Care | Payer: Medicare Other | Source: Ambulatory Visit | Attending: Radiation Oncology | Admitting: Radiation Oncology

## 2020-11-28 ENCOUNTER — Other Ambulatory Visit: Payer: Self-pay

## 2020-11-28 DIAGNOSIS — J984 Other disorders of lung: Secondary | ICD-10-CM | POA: Diagnosis not present

## 2020-11-28 DIAGNOSIS — J9811 Atelectasis: Secondary | ICD-10-CM | POA: Diagnosis not present

## 2020-11-28 DIAGNOSIS — M2550 Pain in unspecified joint: Secondary | ICD-10-CM | POA: Diagnosis not present

## 2020-11-28 DIAGNOSIS — J8410 Pulmonary fibrosis, unspecified: Secondary | ICD-10-CM | POA: Diagnosis not present

## 2020-11-28 DIAGNOSIS — N401 Enlarged prostate with lower urinary tract symptoms: Secondary | ICD-10-CM | POA: Diagnosis not present

## 2020-11-28 DIAGNOSIS — C3412 Malignant neoplasm of upper lobe, left bronchus or lung: Secondary | ICD-10-CM | POA: Diagnosis not present

## 2020-11-28 DIAGNOSIS — Z510 Encounter for antineoplastic radiation therapy: Secondary | ICD-10-CM | POA: Diagnosis not present

## 2020-11-28 DIAGNOSIS — C7931 Secondary malignant neoplasm of brain: Secondary | ICD-10-CM | POA: Diagnosis not present

## 2020-11-28 DIAGNOSIS — R5383 Other fatigue: Secondary | ICD-10-CM | POA: Diagnosis not present

## 2020-11-28 DIAGNOSIS — Z85118 Personal history of other malignant neoplasm of bronchus and lung: Secondary | ICD-10-CM | POA: Diagnosis not present

## 2020-11-28 DIAGNOSIS — C778 Secondary and unspecified malignant neoplasm of lymph nodes of multiple regions: Secondary | ICD-10-CM | POA: Diagnosis not present

## 2020-11-29 ENCOUNTER — Encounter: Payer: Self-pay | Admitting: Internal Medicine

## 2020-11-29 ENCOUNTER — Ambulatory Visit
Admission: RE | Admit: 2020-11-29 | Discharge: 2020-11-29 | Disposition: A | Discharge status: Home / Self Care | Payer: Medicare Other | Source: Ambulatory Visit | Attending: Radiation Oncology | Admitting: Radiation Oncology

## 2020-11-29 ENCOUNTER — Inpatient Hospital Stay: Payer: Medicare Other

## 2020-11-29 ENCOUNTER — Inpatient Hospital Stay (HOSPITAL_BASED_OUTPATIENT_CLINIC_OR_DEPARTMENT_OTHER): Payer: Medicare Other | Admitting: Internal Medicine

## 2020-11-29 ENCOUNTER — Telehealth: Payer: Self-pay | Admitting: Pharmacy Technician

## 2020-11-29 DIAGNOSIS — R413 Other amnesia: Secondary | ICD-10-CM | POA: Insufficient documentation

## 2020-11-29 DIAGNOSIS — E1122 Type 2 diabetes mellitus with diabetic chronic kidney disease: Secondary | ICD-10-CM | POA: Insufficient documentation

## 2020-11-29 DIAGNOSIS — N183 Chronic kidney disease, stage 3 unspecified: Secondary | ICD-10-CM | POA: Insufficient documentation

## 2020-11-29 DIAGNOSIS — Z8673 Personal history of transient ischemic attack (TIA), and cerebral infarction without residual deficits: Secondary | ICD-10-CM | POA: Insufficient documentation

## 2020-11-29 DIAGNOSIS — Z79899 Other long term (current) drug therapy: Secondary | ICD-10-CM | POA: Insufficient documentation

## 2020-11-29 DIAGNOSIS — C7931 Secondary malignant neoplasm of brain: Secondary | ICD-10-CM | POA: Diagnosis not present

## 2020-11-29 DIAGNOSIS — Z818 Family history of other mental and behavioral disorders: Secondary | ICD-10-CM | POA: Insufficient documentation

## 2020-11-29 DIAGNOSIS — Z794 Long term (current) use of insulin: Secondary | ICD-10-CM | POA: Insufficient documentation

## 2020-11-29 DIAGNOSIS — C3412 Malignant neoplasm of upper lobe, left bronchus or lung: Secondary | ICD-10-CM | POA: Insufficient documentation

## 2020-11-29 DIAGNOSIS — Z833 Family history of diabetes mellitus: Secondary | ICD-10-CM | POA: Insufficient documentation

## 2020-11-29 DIAGNOSIS — N401 Enlarged prostate with lower urinary tract symptoms: Secondary | ICD-10-CM | POA: Insufficient documentation

## 2020-11-29 DIAGNOSIS — Z8249 Family history of ischemic heart disease and other diseases of the circulatory system: Secondary | ICD-10-CM | POA: Insufficient documentation

## 2020-11-29 DIAGNOSIS — R338 Other retention of urine: Secondary | ICD-10-CM | POA: Insufficient documentation

## 2020-11-29 DIAGNOSIS — R7989 Other specified abnormal findings of blood chemistry: Secondary | ICD-10-CM | POA: Insufficient documentation

## 2020-11-29 DIAGNOSIS — M2550 Pain in unspecified joint: Secondary | ICD-10-CM | POA: Insufficient documentation

## 2020-11-29 DIAGNOSIS — R5383 Other fatigue: Secondary | ICD-10-CM | POA: Insufficient documentation

## 2020-11-29 DIAGNOSIS — E785 Hyperlipidemia, unspecified: Secondary | ICD-10-CM | POA: Insufficient documentation

## 2020-11-29 DIAGNOSIS — Z6828 Body mass index (BMI) 28.0-28.9, adult: Secondary | ICD-10-CM | POA: Insufficient documentation

## 2020-11-29 DIAGNOSIS — C778 Secondary and unspecified malignant neoplasm of lymph nodes of multiple regions: Secondary | ICD-10-CM | POA: Diagnosis not present

## 2020-11-29 DIAGNOSIS — I82402 Acute embolism and thrombosis of unspecified deep veins of left lower extremity: Secondary | ICD-10-CM | POA: Insufficient documentation

## 2020-11-29 DIAGNOSIS — Z8719 Personal history of other diseases of the digestive system: Secondary | ICD-10-CM | POA: Insufficient documentation

## 2020-11-29 DIAGNOSIS — Z8042 Family history of malignant neoplasm of prostate: Secondary | ICD-10-CM | POA: Insufficient documentation

## 2020-11-29 DIAGNOSIS — Z86711 Personal history of pulmonary embolism: Secondary | ICD-10-CM | POA: Insufficient documentation

## 2020-11-29 DIAGNOSIS — Z510 Encounter for antineoplastic radiation therapy: Secondary | ICD-10-CM | POA: Diagnosis not present

## 2020-11-29 LAB — CBC WITH DIFFERENTIAL/PLATELET
Abs Immature Granulocytes: 0.06 10*3/uL (ref 0.00–0.07)
Basophils Absolute: 0 10*3/uL (ref 0.0–0.1)
Basophils Relative: 0 %
Eosinophils Absolute: 0 10*3/uL (ref 0.0–0.5)
Eosinophils Relative: 0 %
HCT: 41.7 % (ref 39.0–52.0)
Hemoglobin: 14.2 g/dL (ref 13.0–17.0)
Immature Granulocytes: 1 %
Lymphocytes Relative: 7 %
Lymphs Abs: 0.7 10*3/uL (ref 0.7–4.0)
MCH: 29.8 pg (ref 26.0–34.0)
MCHC: 34.1 g/dL (ref 30.0–36.0)
MCV: 87.6 fL (ref 80.0–100.0)
Monocytes Absolute: 0.6 10*3/uL (ref 0.1–1.0)
Monocytes Relative: 6 %
Neutro Abs: 8.3 10*3/uL — ABNORMAL HIGH (ref 1.7–7.7)
Neutrophils Relative %: 86 %
Platelets: 185 10*3/uL (ref 150–400)
RBC: 4.76 MIL/uL (ref 4.22–5.81)
RDW: 14.6 % (ref 11.5–15.5)
WBC: 9.7 10*3/uL (ref 4.0–10.5)
nRBC: 0 % (ref 0.0–0.2)

## 2020-11-29 LAB — COMPREHENSIVE METABOLIC PANEL
ALT: 83 U/L — ABNORMAL HIGH (ref 0–44)
AST: 35 U/L (ref 15–41)
Albumin: 3.3 g/dL — ABNORMAL LOW (ref 3.5–5.0)
Alkaline Phosphatase: 97 U/L (ref 38–126)
Anion gap: 9 (ref 5–15)
BUN: 24 mg/dL — ABNORMAL HIGH (ref 8–23)
CO2: 23 mmol/L (ref 22–32)
Calcium: 9 mg/dL (ref 8.9–10.3)
Chloride: 99 mmol/L (ref 98–111)
Creatinine, Ser: 1.16 mg/dL (ref 0.61–1.24)
GFR, Estimated: >60 mL/min (ref >60)
Glucose, Bld: 361 mg/dL — ABNORMAL HIGH (ref 70–99)
Potassium: 4.8 mmol/L (ref 3.5–5.1)
Sodium: 131 mmol/L — ABNORMAL LOW (ref 135–145)
Total Bilirubin: 1.1 mg/dL (ref 0.3–1.2)
Total Protein: 6.6 g/dL (ref 6.5–8.1)

## 2020-11-29 MED ORDER — DEXAMETHASONE 1 MG PO TABS: 1.0000 mg | ORAL_TABLET | Freq: Two times a day (BID) | ORAL | 0 refills | Status: DC

## 2020-11-29 MED ORDER — GLIPIZIDE 5 MG PO TABS: 5.0000 mg | ORAL_TABLET | Freq: Two times a day (BID) | ORAL | 3 refills | Status: DC

## 2020-11-29 MED ORDER — LORLATINIB 100 MG PO TABS: 100.0000 mg | ORAL_TABLET | Freq: Every day | ORAL | 6 refills | Status: DC

## 2020-11-29 NOTE — Progress Notes (Signed)
Melvin Willis OFFICE PROGRESS NOTE  Patient Care Team: [name], [name], [name], [name], [facility] (Pharmacist)  Cancer Staging No matching staging information was found for the patient.   Oncology History Overview Note  # OCT 2017- ADENO CA LUNG; STAGE IV [; LUL; bil supraclavicular LN; Left neck LN Bx]; ROS-1 MUTATED; s/p Carbo-alimta x1[oct 2017]  # NOV 1st 2017- XALKORI 250 mg BID; JAN 15th CT- PR;  # AUG 27th Chemo-RT to persistent LUL/mediastinal LN [s/p carbo-taxol- with RT; finished Sep 22nd 2018];   # July 14th 2020- multiple metastatic lesions of the brain [July 30 whole brain radiation finished April 21, 2019]; bone scan skull metastases /posterior left ninth rib metastases; April 04, 2019 stopped crizotinib;  #April 25, 2019-start Entrectinib 600 mg once a day; STopped in NOV 2020 [dizziness]; NOV 2020-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   # LLE DVT/bil PE/Multiple strokes [? On xarelto]-Lovenox; Jan mid 2018- xarelto [lovenox-insurance issues]  # MRI brain- multiple infarcts [2d echo/bubble study-NEG's/p Neurology eval]  ------------------------------------------------------------    # # s/p TURP [Sep 2017; Dr.Cope] DEC 26th CT- distended bladder;  # Elevated PSA-  FEB 2021- 12;  S/p Bx- Negative; follow with urology/Dr.Stoioff   # MOLECULAR TESTING- ROS-1 POSITIVE; ALK/EGFR-NEG; PDL-1 EXPRESSION- 90%** [HIGH]  # DEC 15th 2020- PALLIATIVE CARE -----------------------------------------------------------------    DIAGNOSIS: Adenocarcinoma the lung  ROS-1  +  STAGE:  IV    ;GOALS: Palliative  CURRENT/MOST RECENT THERAPY- Avastin [C]   Primary cancer of left upper lobe of lung ([place])  08/09/2019 -  Chemotherapy   The patient had bevacizumab-awwb (MVASI) 800 mg in sodium chloride 0.9 % 100 mL chemo infusion, 725 mg, Intravenous,  Once, 6 of 6 cycles Dose modification: 10 mg/kg (original dose 10 mg/kg, Cycle 3, Reason: Other (see comments), Comment: weight change) Administration: 800 mg (08/09/2019), 800 mg (08/22/2019), 700 mg (09/19/2019), 700 mg (10/04/2019), 700 mg (10/18/2019), 700 mg (11/01/2019)  for chemotherapy treatment.        INTERVAL HISTORY:  Melvin Willis 70 y.o.  male pleasant patient above history of metastatic lung cancer-to brain Ros-1 positive-most recently on  Rozyltrk [2 pills] s here for follow-up.  Unfortunately patient was admitted to the hospital for acute mental status changes/focal deficits.  On MRI brain noted to have progressive lesions in the brain.  Status post evaluation with radiation oncology.  Patient currently on dexamethasone.  Patient also noted to have elevated LFTs/signs and symptoms of sepsis empirically treated with antibiotics.  No source of infection noted.  Patient also noted to have acute urinary retention.  Status post Foley catheter.  Awaiting evaluation outpatient with urology next week.  Walking with walker. No falls.    Review of Systems  Constitutional: Positive for malaise/fatigue. Negative for chills, diaphoresis, fever and weight loss.  HENT: Negative for nosebleeds and sore throat.   Eyes: Negative for double vision.  Respiratory: Negative for cough, hemoptysis, sputum production, shortness of breath and wheezing.   Cardiovascular: Negative for chest pain (Left upper chest wall pain chronic), palpitations and orthopnea.  Gastrointestinal: Negative for abdominal pain, blood in stool, constipation, diarrhea, heartburn and melena.  Genitourinary: Negative  for dysuria.   Musculoskeletal: Positive for joint pain. Negative for back pain.  Skin: Negative.  Negative for itching and rash.  Neurological: Positive for tingling. Negative for focal weakness and weakness.  Endo/Heme/Allergies: Does not bruise/bleed easily.  Psychiatric/Behavioral: Positive for memory loss. Negative for depression. The patient is not nervous/anxious and does not have insomnia.     PAST MEDICAL HISTORY :  Past Medical History:  Diagnosis Date  . Abnormal prostate specific antigen 08/08/2012  . Adiposity 04/16/2015  . Chronic kidney disease (CKD), stage III (moderate) ([place]) 11/27/2016  . CVA (cerebral vascular accident) ([place]) 06/17/2016  . Diabetes mellitus without complication ([place])   . Diverticulosis of sigmoid colon 04/16/2015  . Dyslipidemia 03/18/2015  . Hemorrhoids, internal 04/16/2015  . Hypercholesteremia 04/16/2015  . Hyperlipidemia   . Hypertension   . Primary cancer of left upper lobe of lung ([place])   . Pulmonary embolism ([place])   . Wears dentures    partial upper    PAST SURGICAL HISTORY :   Past Surgical History:  Procedure Laterality Date  . COLONOSCOPY    . COLONOSCOPY WITH PROPOFOL N/A 05/06/2015   Procedure: COLONOSCOPY WITH PROPOFOL;  Surgeon: Lucilla Lame, MD;  Location: [facility];  Service: Endoscopy;  Laterality: N/A;  ASCENDING COLON POLYPS X 2 TERMINAL ILEUM BIOPSY RANDOM COLON BX. TRANSVERSE COLON POLYP SIGMOID COLON POLYP  . ESOPHAGOGASTRODUODENOSCOPY (EGD) WITH PROPOFOL N/A 05/06/2015   Procedure: ESOPHAGOGASTRODUODENOSCOPY (EGD) WITH PROPOFOL;  Surgeon: Lucilla Lame, MD;  Location: [facility];  Service: Endoscopy;  Laterality: N/A;  GASTRIC BIOPSY X1  . KIDNEY STONE SURGERY  2017    FAMILY HISTORY :   Family History  Problem Relation Age of Onset  . Diabetes Mother   . Diabetes Father   . CAD Father   . Dementia Father   . Diabetes Sister   . Cancer Maternal Uncle        Prostate  . Cancer Cousin        prostate    SOCIAL  HISTORY:   Social History   Tobacco Use  . Smoking status: Never Smoker  . Smokeless tobacco: Never Used  Vaping Use  . Vaping Use: Never used  Substance Use Topics  . Alcohol use: No    Alcohol/week: 0.0 standard drinks  . Drug use: No    ALLERGIES:  has No Known Allergies.  MEDICATIONS:  Current Outpatient Medications  Medication Sig Dispense Refill  . atorvastatin (LIPITOR) 40 MG tablet Take 1 tablet (40 mg total) by mouth daily. 90 tablet 0  . blood glucose meter kit and supplies Dispense based on patient and insurance preference (Accuchek Aviva Plus, Accuchek Nano). Use up to four times daily as directed. (FOR ICD-10 E10.9, E11.9). 1 each 0  . dexamethasone (DECADRON) 1 MG tablet Take 1 tablet (1 mg total) by mouth 2 (two) times daily. 45 tablet 0  . finasteride (PROSCAR) 5 MG tablet Take 1 tablet (5 mg total) by mouth daily. 90 tablet 3  . Insulin Glargine-Lixisenatide (SOLIQUA [place]) Inject 15 Units into the skin daily.    . Insulin Pen Needle (NOVOFINE PEN NEEDLE) 32G X 6 MM MISC 1 each by Does not apply route daily. 100 each 2  . levETIRAcetam (KEPPRA) 500 MG tablet Take 1 tablet (500 mg total) by mouth 2 (two) times daily. 60 tablet 0  . lorlatinib (LORBRENA) 100 MG tablet Take 1 tablet (100 mg total) by mouth daily. Swallow tablets whole. Do not chew, crush or split tablets.  30 tablet 6  . ondansetron (ZOFRAN) 8 MG tablet One pill every 8 hours as needed for nausea/vomitting. 40 tablet 1  . tamsulosin (FLOMAX) 0.4 MG CAPS capsule Take 1 capsule by mouth once daily in the morning 90 capsule 3  . gabapentin (NEURONTIN) 300 MG capsule Take 1 capsule by mouth at bedtime 90 capsule 1  . glipiZIDE (GLUCOTROL) 5 MG tablet Take 1 tablet (5 mg total) by mouth 2 (two) times daily before a meal. 60 tablet 3   No current facility-administered medications for this visit.    PHYSICAL EXAMINATION: ECOG PERFORMANCE STATUS: 0 - Asymptomatic  BP 116/72 (BP Location: Right Arm, Patient  Position: Sitting, Cuff Size: Normal) Comment: Recheck  Pulse 70   Temp (!) 97.5 F (36.4 C) (Tympanic)   Resp 16   Wt 166 lb (75.3 kg)   BMI 28.49 kg/m   Filed Weights   11/29/20 0938  Weight: 166 lb (75.3 kg)    Physical Exam Constitutional:      Comments: Patient is accompanied by his wife.  He is walking by himself.  HENT:     Head: Normocephalic and atraumatic.     Mouth/Throat:     Pharynx: No oropharyngeal exudate.  Eyes:     Pupils: Pupils are equal, round, and reactive to light.  Cardiovascular:     Rate and Rhythm: Normal rate and regular rhythm.  Pulmonary:     Effort: No respiratory distress.     Breath sounds: No wheezing.     Comments: Decreased breath sounds bilaterally no wheeze or crackles. Abdominal:     General: Bowel sounds are normal. There is no distension.     Palpations: Abdomen is soft. There is no mass.     Tenderness: There is no abdominal tenderness. There is no guarding or rebound.  Musculoskeletal:        General: No tenderness. Normal range of motion.     Cervical back: Normal range of motion and neck supple.  Skin:    General: Skin is warm.  Neurological:     Mental Status: He is alert and oriented to person, place, and time.  Psychiatric:        Mood and Affect: Affect normal.    LABORATORY DATA:  I have reviewed the data as listed    Component Value Date/Time   NA 131 (L) 11/29/2020 0857   NA 139 01/01/2016 1000   K 4.8 11/29/2020 0857   CL 99 11/29/2020 0857   CO2 23 11/29/2020 0857   GLUCOSE 361 (H) 11/29/2020 0857   BUN 24 (H) 11/29/2020 0857   BUN 13 01/01/2016 1000   CREATININE 1.16 11/29/2020 0857   CREATININE 1.58 (H) 07/26/2020 1014   CALCIUM 9.0 11/29/2020 0857   PROT 6.6 11/29/2020 0857   PROT 7.2 01/01/2016 1000   ALBUMIN 3.3 (L) 11/29/2020 0857   ALBUMIN 4.4 01/01/2016 1000   AST 35 11/29/2020 0857   ALT 83 (H) 11/29/2020 0857   ALKPHOS 97 11/29/2020 0857   BILITOT 1.1 11/29/2020 0857   BILITOT 0.4  01/01/2016 1000   GFRNONAA >60 11/29/2020 0857   GFRNONAA 45 (L) 07/26/2020 1014   GFRAA 52 (L) 07/26/2020 1014    No results found for: SPEP, UPEP  Lab Results  Component Value Date   WBC 9.7 11/29/2020   NEUTROABS 8.3 (H) 11/29/2020   HGB 14.2 11/29/2020   HCT 41.7 11/29/2020   MCV 87.6 11/29/2020   PLT 185 11/29/2020  Chemistry      Component Value Date/Time   NA 131 (L) 11/29/2020 0857   NA 139 01/01/2016 1000   K 4.8 11/29/2020 0857   CL 99 11/29/2020 0857   CO2 23 11/29/2020 0857   BUN 24 (H) 11/29/2020 0857   BUN 13 01/01/2016 1000   CREATININE 1.16 11/29/2020 0857   CREATININE 1.58 (H) 07/26/2020 1014      Component Value Date/Time   CALCIUM 9.0 11/29/2020 0857   ALKPHOS 97 11/29/2020 0857   AST 35 11/29/2020 0857   ALT 83 (H) 11/29/2020 0857   BILITOT 1.1 11/29/2020 0857   BILITOT 0.4 01/01/2016 1000       RADIOGRAPHIC STUDIES: I have personally reviewed the radiological images as listed and agreed with the findings in the report. No results found.   ASSESSMENT & PLAN:  Primary cancer of left upper lobe of lung ([place]) # Adenocarcinoma of the lung metastatic to brain/stage IV-ROS-1 positive; MARCH 2022- CT chest no evidence of any progressive metastatic disease in the lung; however MRI brain MARCH 2022-progressive disease  #Given progressive disease in the brain-patient currently on whole brain radiation [last 3/28].  We will plan to get MRI brain approximately mid to late May.    #Reviewed with the patient and wife regarding the progression of disease on multiple lines of therapy unfortunately is a poor prognosis.  We will plan to start lorlatinib in approximately 2 weeks.  100 mg a day.  New prescription started.  Discussed with Bethena Roys.   # Poorly controlled Blood glucose/ 362 PBF- recommend tapering dex 1 mg twice daily; increasing glipizide to 5 mg BID: continue Lantus 15 units at night.  # Bilateral PE & left lower extremity DVT: Continue HOLD  xarelto; for the next 2 months or so until repeat MRI brain.    # CKD stage III-creatinine 1.7 [GFR~49]- STABLE. .   # Peripheral Neuropathy- ? 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    No orders of the defined types were placed in this encounter.  All questions were answered. The patient knows to call the clinic with any problems, questions or concerns.      Cammie Sickle, MD 12/02/2020 10:26 PM

## 2020-11-29 NOTE — Telephone Encounter (Signed)
Oral Oncology Patient Advocate Encounter  Met patient after appointment to complete application for [employer] PAP in an effort to reduce patient's out of pocket expense for Lorbrena to $0.    Application completed and faxed to [phone] ON 12/03/20.   [employer] patient assistance phone number for follow up is [phone].   This encounter will be updated until final determination.    [place] Patient [place] Phone [phone] Fax [phone] 12/04/2020 10:01 AM

## 2020-11-29 NOTE — Assessment & Plan Note (Signed)
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  We will plan to get MRI brain approximately mid to late May.    #Reviewed with the patient and wife regarding the progression of disease on multiple lines of therapy unfortunately is a poor prognosis.  We will plan to start lorlatinib in approximately 2 weeks.  100 mg a day.  New prescription started.  Discussed with Bethena Roys.   # Poorly controlled Blood glucose/ 362 PBF- recommend tapering dex 1 mg twice daily; increasing glipizide to 5 mg BID: continue Lantus 15 units at night.  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    # CKD stage III-creatinine 1.7 [GFR~49]- STABLE. .   # Peripheral Neuropathy- ? 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

## 2020-11-29 NOTE — Telephone Encounter (Signed)
Oral Oncology Patient Advocate Encounter  Prior Authorization for Melvin Willis has been approved.    PA# [id number] Effective dates: 11/29/20 through 05/28/21  Patients co-pay is $2675.27.  Will apply for [place] Patient Assistance due to grant foundations being closed.  Oral Oncology Clinic will continue to follow.   [place] Patient [place] Phone [phone] Fax [phone] 11/29/2020 11:48 AM

## 2020-11-29 NOTE — Progress Notes (Signed)
Patient here for follow up no concerns today.BP elevated however on recheck WNL.

## 2020-11-29 NOTE — Telephone Encounter (Signed)
Oral Oncology Patient Advocate Encounter  Received notification from [facility] that prior authorization for Melvin Willis is required.  PA submitted on CoverMyMeds Key [id number] Status is pending  Oral Oncology Clinic will continue to follow.   [name] [name] Phone [phone] Fax [phone] 11/29/2020 11:44 AM

## 2020-12-02 ENCOUNTER — Telehealth: Payer: Self-pay | Admitting: Pharmacist

## 2020-12-02 ENCOUNTER — Ambulatory Visit
Admission: RE | Admit: 2020-12-02 | Discharge: 2020-12-02 | Disposition: A | Discharge status: Home / Self Care | Payer: Medicare Other | Source: Ambulatory Visit | Attending: Radiation Oncology | Admitting: Radiation Oncology

## 2020-12-02 ENCOUNTER — Inpatient Hospital Stay: Payer: Medicare Other

## 2020-12-02 ENCOUNTER — Other Ambulatory Visit: Payer: Self-pay | Admitting: Internal Medicine

## 2020-12-02 ENCOUNTER — Inpatient Hospital Stay: Payer: Medicare Other | Admitting: Internal Medicine

## 2020-12-02 ENCOUNTER — Inpatient Hospital Stay: Payer: Medicare Other | Admitting: Hospice and Palliative Medicine

## 2020-12-02 ENCOUNTER — Ambulatory Visit: Payer: Medicare Other

## 2020-12-02 DIAGNOSIS — Z510 Encounter for antineoplastic radiation therapy: Secondary | ICD-10-CM | POA: Diagnosis not present

## 2020-12-02 DIAGNOSIS — M2550 Pain in unspecified joint: Secondary | ICD-10-CM | POA: Diagnosis not present

## 2020-12-02 DIAGNOSIS — N401 Enlarged prostate with lower urinary tract symptoms: Secondary | ICD-10-CM | POA: Diagnosis not present

## 2020-12-02 DIAGNOSIS — C7931 Secondary malignant neoplasm of brain: Secondary | ICD-10-CM | POA: Diagnosis not present

## 2020-12-02 DIAGNOSIS — R5383 Other fatigue: Secondary | ICD-10-CM | POA: Diagnosis not present

## 2020-12-02 DIAGNOSIS — C778 Secondary and unspecified malignant neoplasm of lymph nodes of multiple regions: Secondary | ICD-10-CM | POA: Diagnosis not present

## 2020-12-02 DIAGNOSIS — C3412 Malignant neoplasm of upper lobe, left bronchus or lung: Secondary | ICD-10-CM

## 2020-12-02 NOTE — Telephone Encounter (Signed)
Oral Oncology Pharmacist Encounter  Received new prescription for Lorbrena (lorlatinib) for the treatment of metastatic NSCLC, ROS positive, planned duration until disease progression or unacceptable drug toxicity. Planned start 12/13/20.  Lab monitoring: Glucose: CMP from 11/29/20 assessed, patient has elevated glucose. This is a known problem for Melvin Willis. Lorlatinib has a 9% incidence of hyperglycemia, his glucose should continue to be monitored with lorlatinib initiation. Median time to onset 4.8 months Cholesterol and triglycerides: Incidence of grade 3-4, 18% for hypercholesterolemia and 19% for hypertriglyceridemia. Median time to onset 15 days. Recommended to monitor at baseline, then at month 1 and 2, and periodically after. Last Lipid profile in Epic on 07/26/20. Entering new lipid orders for baseline and month 1/2 monitoring. ECG: prior to initiation and periodically, recent ECG on 11/18/20 had a QTc of 444ms BP: Last office BP well controlled, continue to monitor  Prescription dose and frequency assessed.   Current medication list in Epic reviewed, a few DDIs with lorlatinib identified: -Dexamethasone: loratinib may decrease the concentration of dexamethasone. No baseline dose adjustment needed. Monitor for dexamethasone decreased efficacy.  -Atorvastatin: loratinib may decrease the concentration of atorvastatin. No baseline dose adjustment needed. Monitor for atorvastatin decreased efficacy.   Evaluated chart and no patient barriers to medication adherence identified.   Prescription has been e-scribed to the [facility] for benefits analysis and approval.  Oral Oncology Clinic will continue to follow for insurance authorization, copayment issues, initial counseling and start date.  Darl Pikes, PharmD, BCPS, BCOP, CPP Hematology/Oncology Clinical Pharmacist Practitioner [facility]/HP/AP [facility] [phone]  12/02/2020 12:05  PM

## 2020-12-03 ENCOUNTER — Ambulatory Visit: Payer: Medicare Other

## 2020-12-03 DIAGNOSIS — N1831 Chronic kidney disease, stage 3a: Secondary | ICD-10-CM | POA: Diagnosis not present

## 2020-12-03 DIAGNOSIS — E1165 Type 2 diabetes mellitus with hyperglycemia: Secondary | ICD-10-CM | POA: Diagnosis not present

## 2020-12-03 DIAGNOSIS — C3432 Malignant neoplasm of lower lobe, left bronchus or lung: Secondary | ICD-10-CM | POA: Diagnosis not present

## 2020-12-03 DIAGNOSIS — G9341 Metabolic encephalopathy: Secondary | ICD-10-CM | POA: Diagnosis not present

## 2020-12-03 DIAGNOSIS — E1122 Type 2 diabetes mellitus with diabetic chronic kidney disease: Secondary | ICD-10-CM | POA: Diagnosis not present

## 2020-12-03 DIAGNOSIS — C7931 Secondary malignant neoplasm of brain: Secondary | ICD-10-CM | POA: Diagnosis not present

## 2020-12-04 ENCOUNTER — Ambulatory Visit: Payer: Medicare Other

## 2020-12-04 DIAGNOSIS — N1831 Chronic kidney disease, stage 3a: Secondary | ICD-10-CM | POA: Diagnosis not present

## 2020-12-04 DIAGNOSIS — C3432 Malignant neoplasm of lower lobe, left bronchus or lung: Secondary | ICD-10-CM | POA: Diagnosis not present

## 2020-12-04 DIAGNOSIS — G9341 Metabolic encephalopathy: Secondary | ICD-10-CM | POA: Diagnosis not present

## 2020-12-04 DIAGNOSIS — E1122 Type 2 diabetes mellitus with diabetic chronic kidney disease: Secondary | ICD-10-CM | POA: Diagnosis not present

## 2020-12-04 DIAGNOSIS — C7931 Secondary malignant neoplasm of brain: Secondary | ICD-10-CM | POA: Diagnosis not present

## 2020-12-04 DIAGNOSIS — E1165 Type 2 diabetes mellitus with hyperglycemia: Secondary | ICD-10-CM | POA: Diagnosis not present

## 2020-12-04 NOTE — Telephone Encounter (Signed)
Oral Oncology Patient Advocate Encounter  Tried calling patients phone and wife's phone to check and see if they had scheduled shipment from [employer].  Wife's phone did not have voicemail and husbands phone the mailbox was full.  Called [employer] to verify the fax I received stating Melvin Willis was scheduled to be shipped to Melvin Willis.  Rep stated that shipment had been scheduled and that patient should receive his medication on Friday (2 day shipping).  [place] Patient [place] Phone [phone] Fax [phone] 12/04/2020 3:16 PM

## 2020-12-04 NOTE — Telephone Encounter (Signed)
Oral Oncology Patient Advocate Encounter  Received notification from [place] Patient Assistance program that patient has been successfully enrolled into their program to receive Lorbrena from the manufacturer at $0 out of pocket until 09/06/21.    I will call the patient to let him know of the approval and that he will need to reapply for next year.   Specialty Pharmacy that will dispense medication is [facility].  Patient knows to call the office with questions or concerns.   Oral Oncology Clinic will continue to follow.  [place] Patient [place] Phone [phone] Fax [phone] 12/04/2020 10:02 AM

## 2020-12-05 ENCOUNTER — Other Ambulatory Visit: Payer: Self-pay

## 2020-12-05 ENCOUNTER — Ambulatory Visit: Payer: Medicare Other

## 2020-12-05 ENCOUNTER — Ambulatory Visit (INDEPENDENT_AMBULATORY_CARE_PROVIDER_SITE_OTHER): Payer: Medicare Other | Admitting: Physician Assistant

## 2020-12-05 ENCOUNTER — Ambulatory Visit: Payer: Medicare Other | Admitting: Physician Assistant

## 2020-12-05 DIAGNOSIS — R338 Other retention of urine: Secondary | ICD-10-CM | POA: Diagnosis not present

## 2020-12-05 DIAGNOSIS — N401 Enlarged prostate with lower urinary tract symptoms: Secondary | ICD-10-CM | POA: Diagnosis not present

## 2020-12-05 LAB — BLADDER SCAN AMB NON-IMAGING

## 2020-12-05 NOTE — Progress Notes (Signed)
12/05/2020 4:45 PM   Melvin Willis [birth year] [id number]  CC: Chief Complaint  Patient presents with  . Other   HPI: Melvin Willis is a 71 y.o. male with metastatic lung cancer to the brain, PMH DVT/PE on Xarelto, DM2, and CKD stage IIIA who presents today for outpatient voiding trial.  Urologic history notable for BPH with LUTS on Flomax and finasteride and elevated PSA with negative biopsy in 2021.  Patient was recently admitted with acute metabolic encephalopathy and sepsis secondary to Klebsiella pneumonia bacteremia.  During hospitalization, he developed urinary retention requiring Foley catheter placement.  He has been treated with a course of culture appropriate antibiotics.  He is accompanied today by his wife, who contributes to HPI.  Today he reports feeling well after his recent hospitalization.  He continues to take Flomax and finasteride.  Foley catheter in place draining clear, pink urine.  PMH: Past Medical History:  Diagnosis Date  . Abnormal prostate specific antigen 08/08/2012  . Adiposity 04/16/2015  . Chronic kidney disease (CKD), stage III (moderate) ([place]) 11/27/2016  . CVA (cerebral vascular accident) ([place]) 06/17/2016  . Diabetes mellitus without complication ([place])   . Diverticulosis of sigmoid colon 04/16/2015  . Dyslipidemia 03/18/2015  . Hemorrhoids, internal 04/16/2015  . Hypercholesteremia 04/16/2015  . Hyperlipidemia   . Hypertension   . Primary cancer of left upper lobe of lung ([place])   . Pulmonary embolism ([place])   . Wears dentures    partial upper    Surgical History: Past Surgical History:  Procedure Laterality Date  . COLONOSCOPY    . COLONOSCOPY WITH PROPOFOL N/A 05/06/2015   Procedure: COLONOSCOPY WITH PROPOFOL;  Surgeon: Lucilla Lame, MD;  Location: [facility];  Service: Endoscopy;  Laterality: N/A;  ASCENDING COLON POLYPS X 2 TERMINAL ILEUM BIOPSY RANDOM COLON BX. TRANSVERSE COLON POLYP SIGMOID COLON POLYP  .  ESOPHAGOGASTRODUODENOSCOPY (EGD) WITH PROPOFOL N/A 05/06/2015   Procedure: ESOPHAGOGASTRODUODENOSCOPY (EGD) WITH PROPOFOL;  Surgeon: Lucilla Lame, MD;  Location: [facility];  Service: Endoscopy;  Laterality: N/A;  GASTRIC BIOPSY X1  . KIDNEY STONE SURGERY  2017    Home Medications:  Allergies as of 12/05/2020   No Known Allergies     Medication List       Accurate as of December 05, 2020  4:45 PM. If you have any questions, ask your nurse or doctor.        atorvastatin 40 MG tablet Commonly known as: LIPITOR Take 1 tablet (40 mg total) by mouth daily.   blood glucose meter kit and supplies Dispense based on patient and insurance preference (Accuchek Aviva Plus, Accuchek Nano). Use up to four times daily as directed. (FOR ICD-10 E10.9, E11.9).   dexamethasone 1 MG tablet Commonly known as: DECADRON Take 1 tablet (1 mg total) by mouth 2 (two) times daily.   finasteride 5 MG tablet Commonly known as: PROSCAR Take 1 tablet (5 mg total) by mouth daily.   gabapentin 300 MG capsule Commonly known as: NEURONTIN Take 1 capsule by mouth at bedtime   glipiZIDE 5 MG tablet Commonly known as: Glucotrol Take 1 tablet (5 mg total) by mouth 2 (two) times daily before a meal.   levETIRAcetam 500 MG tablet Commonly known as: Keppra Take 1 tablet (500 mg total) by mouth 2 (two) times daily.   lorlatinib 100 MG tablet Commonly known as: LORBRENA Take 1 tablet (100 mg total) by mouth daily. Swallow tablets whole. Do not chew, crush or split tablets.  Novofine Pen Needle 32G X 6 MM Misc Generic drug: Insulin Pen Needle 1 each by Does not apply route daily.   ondansetron 8 MG tablet Commonly known as: ZOFRAN One pill every 8 hours as needed for nausea/vomitting.   SOLIQUA [place] Inject 15 Units into the skin daily.   tamsulosin 0.4 MG Caps capsule Commonly known as: FLOMAX Take 1 capsule by mouth once daily in the morning       Allergies:  No Known Allergies  Family  History: Family History  Problem Relation Age of Onset  . Diabetes Mother   . Diabetes Father   . CAD Father   . Dementia Father   . Diabetes Sister   . Cancer Maternal Uncle        Prostate  . Cancer Cousin        prostate    Social History:   reports that he has never smoked. He has never used smokeless tobacco. He reports that he does not drink alcohol and does not use drugs.  Physical Exam: There were no vitals taken for this visit.  Constitutional:  Alert and oriented, no acute distress, nontoxic appearing HEENT: [place], AT Cardiovascular: No clubbing, cyanosis, or edema Respiratory: Normal respiratory effort, no increased work of breathing Skin: No rashes, bruises or suspicious lesions Neurologic: Grossly intact, no focal deficits, moving all 4 extremities Psychiatric: Normal mood and affect  Laboratory Data: Results for orders placed or performed in visit on 12/05/20  BLADDER SCAN AMB NON-IMAGING  Result Value Ref Range   Scan Result 752m    Assessment & Plan:   1. Benign prostatic hyperplasia with urinary retention Foley catheter removed in the morning, see separate procedure note for details.  Patient returned for PVR in the afternoon.  He reports drinking approximately 32 ounces of fluid.  He has been able to urinate.  PVR 753 mL.  He reports bladder fullness without pain.  Voiding trial failed.  I offered the patient CIC teaching versus Foley catheter replacement and he elected for the latter, see separate procedure note for details.  I recommended repeat voiding trial in 1 week, at which point he is already scheduled for a 685-monthollow-up with Dr. SnDiamantina Providenceo re[place] Patient expressed understanding. - BLADDER SCAN AMB NON-IMAGING   Return in about 1 week (around 12/12/2020) for Voiding trial.  SaDebroah LoopPA-C  BuPlandome Heights22[address] Lex[address] LaneSuWilsonuAlvaradoNC [phone]

## 2020-12-05 NOTE — Progress Notes (Signed)
Catheter Removal  Patient is present today for a catheter removal.  9ml of water was drained from the balloon. A 16FR foley cath was removed from the bladder no complications were noted . Patient tolerated well.  Performed by: Omolola Mittman, PA-C   Follow up/ Additional notes: Push fluids and RTC this afternoon for PVR. 

## 2020-12-05 NOTE — Progress Notes (Signed)
Simple Catheter Placement  Due to urinary retention patient is present today for a foley cath placement.  Patient was cleaned and prepped in a sterile fashion with betadine and 2% lidocaine jelly was instilled into the urethra. A 16 FR foley catheter was inserted, urine return was noted  952ml, urine was yellow in color.  The balloon was filled with 10cc of sterile water.  A night bag was attached for drainage. Patient tolerated well, no complications were noted   Performed by: Debroah Loop, PA-C

## 2020-12-06 ENCOUNTER — Telehealth: Payer: Self-pay | Admitting: Family Medicine

## 2020-12-06 NOTE — Telephone Encounter (Signed)
Tiffany with [facility] is calling in to make provider aware that pt refused home health services.   Phone if needed: 1 [phone] opt 2

## 2020-12-09 DIAGNOSIS — E1122 Type 2 diabetes mellitus with diabetic chronic kidney disease: Secondary | ICD-10-CM | POA: Diagnosis not present

## 2020-12-09 DIAGNOSIS — E1165 Type 2 diabetes mellitus with hyperglycemia: Secondary | ICD-10-CM | POA: Diagnosis not present

## 2020-12-09 DIAGNOSIS — C7931 Secondary malignant neoplasm of brain: Secondary | ICD-10-CM | POA: Diagnosis not present

## 2020-12-09 DIAGNOSIS — C3432 Malignant neoplasm of lower lobe, left bronchus or lung: Secondary | ICD-10-CM | POA: Diagnosis not present

## 2020-12-09 DIAGNOSIS — N1831 Chronic kidney disease, stage 3a: Secondary | ICD-10-CM | POA: Diagnosis not present

## 2020-12-09 DIAGNOSIS — G9341 Metabolic encephalopathy: Secondary | ICD-10-CM | POA: Diagnosis not present

## 2020-12-11 DIAGNOSIS — E1165 Type 2 diabetes mellitus with hyperglycemia: Secondary | ICD-10-CM | POA: Diagnosis not present

## 2020-12-11 DIAGNOSIS — C3432 Malignant neoplasm of lower lobe, left bronchus or lung: Secondary | ICD-10-CM | POA: Diagnosis not present

## 2020-12-11 DIAGNOSIS — G9341 Metabolic encephalopathy: Secondary | ICD-10-CM | POA: Diagnosis not present

## 2020-12-11 DIAGNOSIS — E1122 Type 2 diabetes mellitus with diabetic chronic kidney disease: Secondary | ICD-10-CM | POA: Diagnosis not present

## 2020-12-11 DIAGNOSIS — C7931 Secondary malignant neoplasm of brain: Secondary | ICD-10-CM | POA: Diagnosis not present

## 2020-12-11 DIAGNOSIS — N1831 Chronic kidney disease, stage 3a: Secondary | ICD-10-CM | POA: Diagnosis not present

## 2020-12-12 ENCOUNTER — Other Ambulatory Visit: Payer: Self-pay

## 2020-12-12 ENCOUNTER — Ambulatory Visit: Payer: Medicare Other | Admitting: Physician Assistant

## 2020-12-12 ENCOUNTER — Ambulatory Visit (INDEPENDENT_AMBULATORY_CARE_PROVIDER_SITE_OTHER): Payer: Medicare Other | Admitting: Urology

## 2020-12-12 ENCOUNTER — Encounter: Payer: Self-pay | Admitting: Urology

## 2020-12-12 VITALS — BP 124/81 | HR 96 | Ht 64.0 in | Wt 156.6 lb

## 2020-12-12 DIAGNOSIS — N390 Urinary tract infection, site not specified: Secondary | ICD-10-CM | POA: Diagnosis not present

## 2020-12-12 DIAGNOSIS — N138 Other obstructive and reflux uropathy: Secondary | ICD-10-CM | POA: Diagnosis not present

## 2020-12-12 DIAGNOSIS — N401 Enlarged prostate with lower urinary tract symptoms: Secondary | ICD-10-CM | POA: Diagnosis not present

## 2020-12-12 DIAGNOSIS — Z466 Encounter for fitting and adjustment of urinary device: Secondary | ICD-10-CM

## 2020-12-12 LAB — BLADDER SCAN AMB NON-IMAGING

## 2020-12-12 MED ORDER — SULFAMETHOXAZOLE-TRIMETHOPRIM 800-160 MG PO TABS
1.0000 | ORAL_TABLET | Freq: Once | ORAL | Status: AC
  Administered 2020-12-12: 1 via ORAL

## 2020-12-12 NOTE — Progress Notes (Signed)
Fill and Pull Catheter Removal  Patient is present today for a catheter removal. Patient was cleaned and prepped in a sterile fashion 128ml of sterile water/ saline was instilled into the bladder when the patient felt the urge to urinate. 68ml of water was then drained from the balloon.  A 16FR foley cath was removed from the bladder no complications were noted. Patient was released from office with instructions to RTC this afternoon for PVR. Patient encouraged to push fluids. Per verbal orders from Dr. Diamantina Providence patient to receive 2 doses of Bactrim DS by mouth in office. Patient tolerated well.  Performed by: Gordy Clement, CMA   Follow up/ Additional notes: RTC this afternoon

## 2020-12-12 NOTE — Progress Notes (Signed)
   12/12/2020 12:10 PM   Timithy Maryan Char [birth year] [id number]  Reason for visit: Follow up BPH, retention, elevated PSA  HPI: I saw [name] for the above issues today.  He is a comorbid 70 year old male with a history of metastatic lung cancer to the brain, history of DVT/PE on anticoagulation in the past, elevated PSA of 12 with biopsy showing a 170 g gland and no evidence of prostate cancer.  He has been on maximal medical therapy with BPH and finasteride for his significantly enlarged prostate and urinary symptoms of weak stream.  He was recently hospitalized for sepsis from urinary source with Klebsiella, as well as retention requiring Foley catheter placement.  I personally viewed and interpreted CT from 11/14/2020 that shows a distended bladder and some mild hydroureteronephrosis down to the bladder bilaterally.  He saw our PA for voiding trial last week and was unable to void with a PVR of 900 mL.  We had another long conversation today about options including voiding trial today versus proceeding directly to HOLEP.  We had previously discussed HOLEP in the past. We discussed the risks and benefits of HoLEP at length.  The procedure requires general anesthesia and takes 2 to 3 hours, and a holmium laser is used to enucleate the prostate and push this tissue into the bladder.  A morcellator is then used to remove this tissue, which is sent for pathology.  The vast majority of patients are able to discharge the same day with a catheter in place for 2 to 3 days, and will follow-up in clinic for a voiding trial.  Approximately 5% of patients will be admitted overnight to monitor the urine, or if they have multiple co-morbidities.  We specifically discussed the risks of bleeding, infection, retrograde ejaculation, temporary urgency and urge incontinence, very low risk of long-term incontinence, pathologic evaluation of prostate tissue and possible detection of prostate cancer or other  malignancy, and possible need for additional procedures.  He would like to try a voiding trial today to see if he can get rid of the catheter, but is amenable to proceeding with HOLEP with his recurrent UTI/retention, massively enlarged prostate, and urinary symptoms.  Follow-up this afternoon for PVR, Bactrim prophylaxis given prior to catheter removal Schedule [place], MD  [place] [address] [place], [place] [address] [phone]

## 2020-12-12 NOTE — Addendum Note (Signed)
Addended by: Mickle Plumb on: 12/12/2020 03:25 PM   Modules accepted: Orders

## 2020-12-12 NOTE — Addendum Note (Signed)
Addended by: Evelina Bucy on: 12/12/2020 02:55 PM   Modules accepted: Orders

## 2020-12-12 NOTE — Patient Instructions (Signed)

## 2020-12-12 NOTE — Progress Notes (Signed)
Afternoon follow-up  Patient returned to clinic this afternoon for repeat PVR. He reports drinking approximately 36oz of fluid. He has been able to urinate. PVR >765mL.  Results for orders placed or performed in visit on 12/12/20  Bladder Scan (Post Void Residual) in office  Result Value Ref Range   Scan Result >734mL     Voiding trial failed. Offered patient Foley catheter replacement and he accepted, see procedure note below. One dose Bactrim DS administered in clinic following Foley placement and a second dose provided to take at home this evening.  Follow up: HOLEP per Dr. Diamantina Providence  Simple Catheter Placement  Due to urinary retention patient is present today for a foley cath placement.  Patient was cleaned and prepped in a sterile fashion with betadine and 2% lidocaine jelly was instilled into the urethra. A 16 FR foley catheter was inserted, urine return was noted  1063ml, urine was clear in color.  The balloon was filled with 10cc of sterile water.  A night bag was attached for drainage. Patient was also given a night bag to take home and was given instruction on how to change from one bag to another.  Patient was given instruction on proper catheter care.  Patient tolerated well, no complications were noted   Performed by: Debroah Loop, PA-C

## 2020-12-13 ENCOUNTER — Inpatient Hospital Stay: Payer: Medicare Other | Attending: Internal Medicine

## 2020-12-13 ENCOUNTER — Inpatient Hospital Stay: Payer: Medicare Other | Admitting: Hospice and Palliative Medicine

## 2020-12-13 ENCOUNTER — Encounter: Payer: Self-pay | Admitting: Internal Medicine

## 2020-12-13 ENCOUNTER — Inpatient Hospital Stay (HOSPITAL_BASED_OUTPATIENT_CLINIC_OR_DEPARTMENT_OTHER): Payer: Medicare Other | Admitting: Internal Medicine

## 2020-12-13 DIAGNOSIS — Z818 Family history of other mental and behavioral disorders: Secondary | ICD-10-CM | POA: Diagnosis not present

## 2020-12-13 DIAGNOSIS — C7951 Secondary malignant neoplasm of bone: Secondary | ICD-10-CM | POA: Diagnosis not present

## 2020-12-13 DIAGNOSIS — C7931 Secondary malignant neoplasm of brain: Secondary | ICD-10-CM | POA: Diagnosis not present

## 2020-12-13 DIAGNOSIS — Z833 Family history of diabetes mellitus: Secondary | ICD-10-CM | POA: Diagnosis not present

## 2020-12-13 DIAGNOSIS — Z86718 Personal history of other venous thrombosis and embolism: Secondary | ICD-10-CM | POA: Diagnosis not present

## 2020-12-13 DIAGNOSIS — Z8042 Family history of malignant neoplasm of prostate: Secondary | ICD-10-CM | POA: Insufficient documentation

## 2020-12-13 DIAGNOSIS — R5383 Other fatigue: Secondary | ICD-10-CM | POA: Insufficient documentation

## 2020-12-13 DIAGNOSIS — R972 Elevated prostate specific antigen [PSA]: Secondary | ICD-10-CM | POA: Diagnosis not present

## 2020-12-13 DIAGNOSIS — M2550 Pain in unspecified joint: Secondary | ICD-10-CM | POA: Diagnosis not present

## 2020-12-13 DIAGNOSIS — Z8249 Family history of ischemic heart disease and other diseases of the circulatory system: Secondary | ICD-10-CM | POA: Diagnosis not present

## 2020-12-13 DIAGNOSIS — Z79899 Other long term (current) drug therapy: Secondary | ICD-10-CM | POA: Insufficient documentation

## 2020-12-13 DIAGNOSIS — E1122 Type 2 diabetes mellitus with diabetic chronic kidney disease: Secondary | ICD-10-CM | POA: Insufficient documentation

## 2020-12-13 DIAGNOSIS — E785 Hyperlipidemia, unspecified: Secondary | ICD-10-CM | POA: Diagnosis not present

## 2020-12-13 DIAGNOSIS — C3412 Malignant neoplasm of upper lobe, left bronchus or lung: Secondary | ICD-10-CM

## 2020-12-13 DIAGNOSIS — N1831 Chronic kidney disease, stage 3a: Secondary | ICD-10-CM | POA: Insufficient documentation

## 2020-12-13 DIAGNOSIS — C770 Secondary and unspecified malignant neoplasm of lymph nodes of head, face and neck: Secondary | ICD-10-CM | POA: Insufficient documentation

## 2020-12-13 DIAGNOSIS — R413 Other amnesia: Secondary | ICD-10-CM | POA: Insufficient documentation

## 2020-12-13 DIAGNOSIS — Z7901 Long term (current) use of anticoagulants: Secondary | ICD-10-CM | POA: Diagnosis not present

## 2020-12-13 DIAGNOSIS — G629 Polyneuropathy, unspecified: Secondary | ICD-10-CM | POA: Insufficient documentation

## 2020-12-13 LAB — LIPID PANEL
Cholesterol: 159 mg/dL (ref 0–200)
HDL: 59 mg/dL (ref >40)
LDL Cholesterol: 86 mg/dL (ref 0–99)
Total CHOL/HDL Ratio: 2.7 RATIO
Triglycerides: 71 mg/dL (ref <150)
VLDL: 14 mg/dL (ref 0–40)

## 2020-12-13 LAB — CBC WITH DIFFERENTIAL/PLATELET
Abs Immature Granulocytes: 0.06 10*3/uL (ref 0.00–0.07)
Basophils Absolute: 0 10*3/uL (ref 0.0–0.1)
Basophils Relative: 0 %
Eosinophils Absolute: 0 10*3/uL (ref 0.0–0.5)
Eosinophils Relative: 1 %
HCT: 42.5 % (ref 39.0–52.0)
Hemoglobin: 14.9 g/dL (ref 13.0–17.0)
Immature Granulocytes: 1 %
Lymphocytes Relative: 19 %
Lymphs Abs: 1.1 10*3/uL (ref 0.7–4.0)
MCH: 30.6 pg (ref 26.0–34.0)
MCHC: 35.1 g/dL (ref 30.0–36.0)
MCV: 87.3 fL (ref 80.0–100.0)
Monocytes Absolute: 0.7 10*3/uL (ref 0.1–1.0)
Monocytes Relative: 11 %
Neutro Abs: 4.1 10*3/uL (ref 1.7–7.7)
Neutrophils Relative %: 68 %
Platelets: 148 10*3/uL — ABNORMAL LOW (ref 150–400)
RBC: 4.87 MIL/uL (ref 4.22–5.81)
RDW: 15.3 % (ref 11.5–15.5)
WBC: 6 10*3/uL (ref 4.0–10.5)
nRBC: 0 % (ref 0.0–0.2)

## 2020-12-13 LAB — COMPREHENSIVE METABOLIC PANEL
ALT: 80 U/L — ABNORMAL HIGH (ref 0–44)
AST: 25 U/L (ref 15–41)
Albumin: 3.4 g/dL — ABNORMAL LOW (ref 3.5–5.0)
Alkaline Phosphatase: 85 U/L (ref 38–126)
Anion gap: 10 (ref 5–15)
BUN: 21 mg/dL (ref 8–23)
CO2: 22 mmol/L (ref 22–32)
Calcium: 9.2 mg/dL (ref 8.9–10.3)
Chloride: 101 mmol/L (ref 98–111)
Creatinine, Ser: 1.04 mg/dL (ref 0.61–1.24)
GFR, Estimated: >60 mL/min (ref >60)
Glucose, Bld: 161 mg/dL — ABNORMAL HIGH (ref 70–99)
Potassium: 4.1 mmol/L (ref 3.5–5.1)
Sodium: 133 mmol/L — ABNORMAL LOW (ref 135–145)
Total Bilirubin: 0.7 mg/dL (ref 0.3–1.2)
Total Protein: 6.7 g/dL (ref 6.5–8.1)

## 2020-12-13 NOTE — Patient Instructions (Signed)
#  Take dexamethasone 1 pill every other day for 1 week and stop  #Start taking lorlatinib 1 pill once a day  #Watch blood sugars-again if elevated between 2 50-300-give [place] a call to help adjust insulin

## 2020-12-13 NOTE — Progress Notes (Signed)
States that he tried to take his catheter out yesterday but was still unable to urinate on his own so he had to put it back in.

## 2020-12-13 NOTE — Progress Notes (Signed)
[place] OFFICE PROGRESS NOTE  Patient Care Team: [name], [name], [name], [name], [facility] (Pharmacist)  Cancer Staging No matching staging information was found for the patient.   Oncology History Overview Note  # OCT 2017- ADENO CA LUNG; STAGE IV [; LUL; bil supraclavicular LN; Left neck LN Bx]; ROS-1 MUTATED; s/p Carbo-alimta x1[oct 2017]  # NOV 1st 2017- XALKORI 250 mg BID; JAN 15th CT- PR;  # AUG 27th Chemo-RT to persistent LUL/mediastinal LN [s/p carbo-taxol- with RT; finished Sep 22nd 2018];   # July 14th 2020- multiple metastatic lesions of the brain [July 30 whole brain radiation finished April 21, 2019]; bone scan skull metastases /posterior left ninth rib metastases; April 04, 2019 stopped crizotinib;  #April 25, 2019-start Entrectinib 600 mg once a day; STopped in NOV 2020 [dizziness]; NOV 2020-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  # MRI brain- multiple infarcts [2d echo/bubble study-NEG's/p Neurology eval]  ------------------------------------------------------------    # # s/p TURP [Sep 2017; Dr.Cope] DEC 26th CT- distended bladder;  # Elevated PSA-  FEB 2021- 12;  S/p Bx- Negative; follow with urology/Dr.Stoioff   # MOLECULAR TESTING- ROS-1 POSITIVE; ALK/EGFR-NEG; PDL-1 EXPRESSION-  90%** [HIGH]  # DEC 15th 2020- PALLIATIVE CARE -----------------------------------------------------------------    DIAGNOSIS: Adenocarcinoma the lung  ROS-1 +  STAGE:  IV    ;GOALS: Palliative  CURRENT/MOST RECENT THERAPY- Avastin [C]   Primary cancer of left upper lobe of lung ([place])  08/09/2019 -  Chemotherapy   The patient had bevacizumab-awwb (MVASI) 800 mg in sodium chloride 0.9 % 100 mL chemo infusion, 725 mg, Intravenous,  Once, 6 of 6 cycles Dose modification: 10 mg/kg (original dose 10 mg/kg, Cycle 3, Reason: Other (see comments), Comment: weight change) Administration: 800 mg (08/09/2019), 800 mg (08/22/2019), 700 mg (09/19/2019), 700 mg (10/04/2019), 700 mg (10/18/2019), 700 mg (11/01/2019)  for chemotherapy treatment.        INTERVAL HISTORY:  Melvin Willis 70 y.o.  male pleasant patient above history of metastatic lung cancer-to brain Ros-1 positive-most progress in the brain-currently status post whole brain radiation.  Patient placed radiation on March 28/10 days ago.  Is currently on dexamethasone 1 mg pill twice a day.  Continues to have elevated blood sugars 200 300 range; in spite of going up on Lantus 15 nightly; glipizide.  Patient is able to ambulate at home himself.  No falls.  No mental status changes.  In the interim evaluated by urology unable to explant Foley catheter.   Review of Systems  Constitutional: Positive for malaise/fatigue. Negative for chills, diaphoresis, fever and weight loss.  HENT: Negative for nosebleeds and sore throat.   Eyes: Negative for double vision.  Respiratory: Negative for cough, hemoptysis, sputum production, shortness of breath and wheezing.   Cardiovascular: Negative for chest pain (Left upper chest wall pain chronic), palpitations and orthopnea.  Gastrointestinal: Negative for abdominal pain, blood in stool, constipation, diarrhea, heartburn and  melena.  Genitourinary: Negative for dysuria.  Musculoskeletal: Positive  for joint pain. Negative for back pain.  Skin: Negative.  Negative for itching and rash.  Neurological: Positive for tingling. Negative for focal weakness and weakness.  Endo/Heme/Allergies: Does not bruise/bleed easily.  Psychiatric/Behavioral: Positive for memory loss. Negative for depression. The patient is not nervous/anxious and does not have insomnia.     PAST MEDICAL HISTORY :  Past Medical History:  Diagnosis Date  . Abnormal prostate specific antigen 08/08/2012  . Adiposity 04/16/2015  . Chronic kidney disease (CKD), stage III (moderate) ([place]) 11/27/2016  . CVA (cerebral vascular accident) ([place]) 06/17/2016  . Diabetes mellitus without complication ([place])   . Diverticulosis of sigmoid colon 04/16/2015  . Dyslipidemia 03/18/2015  . Hemorrhoids, internal 04/16/2015  . Hypercholesteremia 04/16/2015  . Hyperlipidemia   . Hypertension   . Primary cancer of left upper lobe of lung ([place])   . Pulmonary embolism ([place])   . Wears dentures    partial upper    PAST SURGICAL HISTORY :   Past Surgical History:  Procedure Laterality Date  . COLONOSCOPY    . COLONOSCOPY WITH PROPOFOL N/A 05/06/2015   Procedure: COLONOSCOPY WITH PROPOFOL;  Surgeon: Lucilla Lame, MD;  Location: [facility];  Service: Endoscopy;  Laterality: N/A;  ASCENDING COLON POLYPS X 2 TERMINAL ILEUM BIOPSY RANDOM COLON BX. TRANSVERSE COLON POLYP SIGMOID COLON POLYP  . ESOPHAGOGASTRODUODENOSCOPY (EGD) WITH PROPOFOL N/A 05/06/2015   Procedure: ESOPHAGOGASTRODUODENOSCOPY (EGD) WITH PROPOFOL;  Surgeon: Lucilla Lame, MD;  Location: [facility];  Service: Endoscopy;  Laterality: N/A;  GASTRIC BIOPSY X1  . KIDNEY STONE SURGERY  2017    FAMILY HISTORY :   Family History  Problem Relation Age of Onset  . Diabetes Mother   . Diabetes Father   . CAD Father   . Dementia Father   . Diabetes Sister   . Cancer Maternal Uncle        Prostate  . Cancer Cousin        prostate    SOCIAL HISTORY:   Social History    Tobacco Use  . Smoking status: Never Smoker  . Smokeless tobacco: Never Used  Vaping Use  . Vaping Use: Never used  Substance Use Topics  . Alcohol use: No    Alcohol/week: 0.0 standard drinks  . Drug use: No    ALLERGIES:  has No Known Allergies.  MEDICATIONS:  Current Outpatient Medications  Medication Sig Dispense Refill  . atorvastatin (LIPITOR) 40 MG tablet Take 1 tablet (40 mg total) by mouth daily. 90 tablet 0  . blood glucose meter kit and supplies Dispense based on patient and insurance preference (Accuchek Aviva Plus, Accuchek Nano). Use up to four times daily as directed. (FOR ICD-10 E10.9, E11.9). 1 each 0  . dexamethasone (DECADRON) 1 MG tablet Take 1 tablet (1 mg total) by mouth 2 (two) times daily. 45 tablet 0  . finasteride (PROSCAR) 5 MG tablet Take 1 tablet (5 mg total) by mouth daily. 90 tablet 3  . gabapentin (NEURONTIN) 300 MG capsule Take 1 capsule by mouth at bedtime 90 capsule 1  . glipiZIDE (GLUCOTROL) 5 MG tablet Take 1 tablet (5 mg total) by mouth 2 (two) times daily before a meal. 60 tablet 3  . Insulin Glargine-Lixisenatide (SOLIQUA [place]) Inject 15 Units into the skin daily.    . Insulin Pen Needle (NOVOFINE PEN NEEDLE) 32G X 6 MM MISC 1 each by Does not apply route daily. 100 each 2  .  levETIRAcetam (KEPPRA) 500 MG tablet Take 1 tablet (500 mg total) by mouth 2 (two) times daily. 60 tablet 0  . lorlatinib (LORBRENA) 100 MG tablet Take 1 tablet (100 mg total) by mouth daily. Swallow tablets whole. Do not chew, crush or split tablets. 30 tablet 6  . tamsulosin (FLOMAX) 0.4 MG CAPS capsule Take 1 capsule by mouth once daily in the morning 90 capsule 3   No current facility-administered medications for this visit.    PHYSICAL EXAMINATION: ECOG PERFORMANCE STATUS: 0 - Asymptomatic  BP 130/86 (BP Location: Right Arm, Patient Position: Sitting, Cuff Size: Normal)   Pulse 70   Temp (!) 96.9 F (36.1 C) (Tympanic)   Resp 16   Ht '5\' 4"'  (1.626 m)   Wt 157  lb (71.2 kg)   SpO2 100%   BMI 26.95 kg/m   Filed Weights   12/13/20 0928  Weight: 157 lb (71.2 kg)    Physical Exam Constitutional:      Comments: Patient is accompanied by his wife.  He is in a wheelchair.  HENT:     Head: Normocephalic and atraumatic.     Mouth/Throat:     Pharynx: No oropharyngeal exudate.  Eyes:     Pupils: Pupils are equal, round, and reactive to light.  Cardiovascular:     Rate and Rhythm: Normal rate and regular rhythm.  Pulmonary:     Effort: No respiratory distress.     Breath sounds: No wheezing.     Comments: Decreased breath sounds bilaterally no wheeze or crackles. Abdominal:     General: Bowel sounds are normal. There is no distension.     Palpations: Abdomen is soft. There is no mass.     Tenderness: There is no abdominal tenderness. There is no guarding or rebound.  Musculoskeletal:        General: No tenderness. Normal range of motion.     Cervical back: Normal range of motion and neck supple.  Skin:    General: Skin is warm.  Neurological:     Mental Status: He is alert and oriented to person, place, and time.  Psychiatric:        Mood and Affect: Affect normal.    LABORATORY DATA:  I have reviewed the data as listed    Component Value Date/Time   NA 133 (L) 12/13/2020 0901   NA 139 01/01/2016 1000   K 4.1 12/13/2020 0901   CL 101 12/13/2020 0901   CO2 22 12/13/2020 0901   GLUCOSE 161 (H) 12/13/2020 0901   BUN 21 12/13/2020 0901   BUN 13 01/01/2016 1000   CREATININE 1.04 12/13/2020 0901   CREATININE 1.58 (H) 07/26/2020 1014   CALCIUM 9.2 12/13/2020 0901   PROT 6.7 12/13/2020 0901   PROT 7.2 01/01/2016 1000   ALBUMIN 3.4 (L) 12/13/2020 0901   ALBUMIN 4.4 01/01/2016 1000   AST 25 12/13/2020 0901   ALT 80 (H) 12/13/2020 0901   ALKPHOS 85 12/13/2020 0901   BILITOT 0.7 12/13/2020 0901   BILITOT 0.4 01/01/2016 1000   GFRNONAA >60 12/13/2020 0901   GFRNONAA 45 (L) 07/26/2020 1014   GFRAA 52 (L) 07/26/2020 1014    No  results found for: SPEP, UPEP  Lab Results  Component Value Date   WBC 6.0 12/13/2020   NEUTROABS 4.1 12/13/2020   HGB 14.9 12/13/2020   HCT 42.5 12/13/2020   MCV 87.3 12/13/2020   PLT 148 (L) 12/13/2020      Chemistry  Component Value Date/Time   NA 133 (L) 12/13/2020 0901   NA 139 01/01/2016 1000   K 4.1 12/13/2020 0901   CL 101 12/13/2020 0901   CO2 22 12/13/2020 0901   BUN 21 12/13/2020 0901   BUN 13 01/01/2016 1000   CREATININE 1.04 12/13/2020 0901   CREATININE 1.58 (H) 07/26/2020 1014      Component Value Date/Time   CALCIUM 9.2 12/13/2020 0901   ALKPHOS 85 12/13/2020 0901   AST 25 12/13/2020 0901   ALT 80 (H) 12/13/2020 0901   BILITOT 0.7 12/13/2020 0901   BILITOT 0.4 01/01/2016 1000       RADIOGRAPHIC STUDIES: I have personally reviewed the radiological images as listed and agreed with the findings in the report. No results found.   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  Again reviewed the potential side effects including but not limited to nausea vomiting diarrhea; elevated blood sugar levels. [See below].   #Brain metastases -[facility] 2022]progressive disease in the brain-status post whole brain radiation last March 28th, 2022- we will plan to get MRI brain approximately mid to late May.    # Poorly controlled Blood glucose/ 161 PBF; continue dex taper 1 pill very other day x1 week; and STOP; continue glipizide to 5 mg BID: continue Lantus 15 units at night.  Monitor closely on lorlatinib.   # Bilateral PE & left lower extremity DVT: Continue hold Xarelto-given the recent hemorrhagic metastasis.  # CKD stage III-creatinine 1.7 [GFR~49]- STABLE. .   # Peripheral Neuropathy- STABLE?  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  # DISPOSITION:  # follow up in 2 weeks-MD; labs cbc/cmp-Dr.B  Cc; Dr.Sowles.    No orders of the defined types were placed in this encounter.  All questions were answered. The patient knows to call the clinic with any problems, questions or concerns.      Cammie Sickle, MD 12/13/2020 10:02 AM

## 2020-12-13 NOTE — Assessment & Plan Note (Addendum)
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  Again reviewed the potential side effects including but not limited to nausea vomiting diarrhea; elevated blood sugar levels. [See below].   #Brain metastases -[facility] 2022]progressive disease in the brain-status post whole brain radiation last March 28th, 2022- we will plan to get MRI brain approximately mid to late May.    # Poorly controlled Blood glucose/ 161 PBF; continue dex taper 1 pill very other day x1 week; and STOP; continue glipizide to 5 mg BID: continue Lantus 15 units at night.  Monitor closely on lorlatinib.   # Bilateral PE & left lower extremity DVT: Continue hold Xarelto-given the recent hemorrhagic metastasis.  # CKD stage III-creatinine 1.7 [GFR~49]- STABLE. .   # Peripheral Neuropathy- STABLE? 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  # DISPOSITION:  # follow up in 2 weeks-MD; labs cbc/cmp-Dr.B  Cc; Dr.Sowles.

## 2020-12-16 DIAGNOSIS — G9341 Metabolic encephalopathy: Secondary | ICD-10-CM | POA: Diagnosis not present

## 2020-12-16 DIAGNOSIS — N1831 Chronic kidney disease, stage 3a: Secondary | ICD-10-CM | POA: Diagnosis not present

## 2020-12-16 DIAGNOSIS — C3432 Malignant neoplasm of lower lobe, left bronchus or lung: Secondary | ICD-10-CM | POA: Diagnosis not present

## 2020-12-16 DIAGNOSIS — E1165 Type 2 diabetes mellitus with hyperglycemia: Secondary | ICD-10-CM | POA: Diagnosis not present

## 2020-12-16 DIAGNOSIS — C7931 Secondary malignant neoplasm of brain: Secondary | ICD-10-CM | POA: Diagnosis not present

## 2020-12-16 DIAGNOSIS — E1122 Type 2 diabetes mellitus with diabetic chronic kidney disease: Secondary | ICD-10-CM | POA: Diagnosis not present

## 2020-12-17 ENCOUNTER — Telehealth: Payer: Self-pay

## 2020-12-17 NOTE — Progress Notes (Signed)
  Chronic Care Management Pharmacy Assistant   Name: Melvin Willis  MRN: [id number] DOB: 02/25/1951  Reason for Encounter:Diabetes Disease State Call.   Recent office visits:  No recent Office visit.  Recent consult visits:  11/29/2020 Oncology Govinda Brahmanday start lorlatinib in approximately 2 weeks.  100 mg a day ,decrease dexamethasone 1 mg twice daily; increasing glipizide to 5 mg BID: continue Lantus 15 units at night. 12/05/2020 Urology Samantha Vaillancourt  12/12/2020 urology Brian Sninsky  12/13/2020 Oncology Govinda Brahmanday continue dexamethasone decrease 1 pill very other day x1 week; and STOP,hold Xarelto-given the recent hemorrhagic metastasis. Hospital visits:  Medication Reconciliation was completed by comparing discharge summary, patient's EMR and Pharmacy list, and upon discussion with patient.  Admitted to the hospital on 11/14/2020 due to Septic Shock. Discharge date was 11/20/2020. Discharged from [facility].    New?Medications Started at Hospital Discharge:?? -started Keppra 500 mg   Medication Changes at Hospital Discharge: -Changed none ID  Medications Discontinued at Hospital Discharge: -Stopped metformin 750 mg, rivaroxaban 20 mg   Medications that remain the same after Hospital Discharge:??  -All other medications will remain the same.    Medications: Outpatient Encounter Medications as of 12/17/2020  Medication Sig  . atorvastatin (LIPITOR) 40 MG tablet Take 1 tablet (40 mg total) by mouth daily.  . blood glucose meter kit and supplies Dispense based on patient and insurance preference (Accuchek Aviva Plus, Accuchek Nano). Use up to four times daily as directed. (FOR ICD-10 E10.9, E11.9).  . dexamethasone (DECADRON) 1 MG tablet Take 1 tablet (1 mg total) by mouth 2 (two) times daily.  . finasteride (PROSCAR) 5 MG tablet Take 1 tablet (5 mg total) by mouth daily.  . gabapentin (NEURONTIN) 300 MG capsule Take 1 capsule by mouth  at bedtime  . glipiZIDE (GLUCOTROL) 5 MG tablet Take 1 tablet (5 mg total) by mouth 2 (two) times daily before a meal.  . Insulin Glargine-Lixisenatide (SOLIQUA [place]) Inject 15 Units into the skin daily.  . Insulin Pen Needle (NOVOFINE PEN NEEDLE) 32G X 6 MM MISC 1 each by Does not apply route daily.  . levETIRAcetam (KEPPRA) 500 MG tablet Take 1 tablet (500 mg total) by mouth 2 (two) times daily.  . lorlatinib (LORBRENA) 100 MG tablet Take 1 tablet (100 mg total) by mouth daily. Swallow tablets whole. Do not chew, crush or split tablets.  . tamsulosin (FLOMAX) 0.4 MG CAPS capsule Take 1 capsule by mouth once daily in the morning   No facility-administered encounter medications on file as of 12/17/2020.   Star Rating Drugs: Atorvastatin 40 mg last filled on 11/09/2020 for 90 day supply. Glipizide 5 mg last filled on 12/28/2020 for 90 day supply.  Recent Relevant Labs: Lab Results  Component Value Date/Time   HGBA1C 10.5 (A) 10/31/2020 10:06 AM   HGBA1C 9.4 (H) 07/26/2020 10:14 AM   HGBA1C 7.1 (H) 03/29/2020 09:34 AM   HGBA1C 9.8 (A) 04/07/2019 11:30 AM   MICROALBUR 50 12/29/2018 09:13 AM   MICROALBUR 20 01/05/2018 10:22 AM    Kidney Function Lab Results  Component Value Date/Time   CREATININE 1.04 12/13/2020 09:01 AM   CREATININE 1.16 11/29/2020 08:57 AM   CREATININE 1.58 (H) 07/26/2020 10:14 AM   CREATININE 1.32 (H) 08/22/2018 11:07 AM   GFRNONAA >60 12/13/2020 09:01 AM   GFRNONAA 45 (L) 07/26/2020 10:14 AM   GFRAA 52 (L) 07/26/2020 10:14 AM    . Current antihyperglycemic regimen:   [place]a 15-30 units daily (  15 units)  Glipizide 5 mg 1 tablet by mouth 2 times daily before meal. . What recent interventions/DTPs have been made to improve glycemic control:  o 11/14/2020 Metformin stop by ED o 12/13/2020 Oncology Lenetta Quaker Brahmanday hold Xarelto-given the recent hemorrhagic metastasis.  . Have there been any recent hospitalizations or ED visits since last visit with CPP?  Yes . Patient reports hypoglycemic symptoms, including Hungry . Patient reports hyperglycemic symptoms, including excessive thirst, fatigue, polyuria and weakness . How often are you checking your blood sugar? once daily . What are your blood sugars ranging?   o Patient wife states patient blood sugar has been up and down due his medication Lorlatinib. - Patient wife states she had to increase his insulin one day last week because his blood sugar was 509 and 400.Patient states after she gave his insulin his blood sugar went down. - Blood sugar readings in the past week 125,300,85,95. . During the week, how often does your blood glucose drop below 70? Never . Are you checking your feet daily/regularly?   Patient wife states patient feet has been swollen for the past few days but has went down today.  Patient wife states patient needs a follow up appointment with his PCP this month for his visit from the [place] wife states he has appointment on 02/05/2021, but that is to far away.  Adherence Review: Is the patient currently on a STATIN medication? Yes Is the patient currently on ACE/ARB medication? No Does the patient have >5 day gap between last estimated fill dates? No   Anderson [place] Clinical Production designer, theatre/television/film [phone]

## 2020-12-18 DIAGNOSIS — E1165 Type 2 diabetes mellitus with hyperglycemia: Secondary | ICD-10-CM | POA: Diagnosis not present

## 2020-12-18 DIAGNOSIS — G9341 Metabolic encephalopathy: Secondary | ICD-10-CM | POA: Diagnosis not present

## 2020-12-18 DIAGNOSIS — N1831 Chronic kidney disease, stage 3a: Secondary | ICD-10-CM | POA: Diagnosis not present

## 2020-12-18 DIAGNOSIS — C3432 Malignant neoplasm of lower lobe, left bronchus or lung: Secondary | ICD-10-CM | POA: Diagnosis not present

## 2020-12-18 DIAGNOSIS — C7931 Secondary malignant neoplasm of brain: Secondary | ICD-10-CM | POA: Diagnosis not present

## 2020-12-18 DIAGNOSIS — E1122 Type 2 diabetes mellitus with diabetic chronic kidney disease: Secondary | ICD-10-CM | POA: Diagnosis not present

## 2020-12-19 DIAGNOSIS — E1165 Type 2 diabetes mellitus with hyperglycemia: Secondary | ICD-10-CM | POA: Diagnosis not present

## 2020-12-19 DIAGNOSIS — N1831 Chronic kidney disease, stage 3a: Secondary | ICD-10-CM | POA: Diagnosis not present

## 2020-12-19 DIAGNOSIS — G9341 Metabolic encephalopathy: Secondary | ICD-10-CM | POA: Diagnosis not present

## 2020-12-19 DIAGNOSIS — C7931 Secondary malignant neoplasm of brain: Secondary | ICD-10-CM | POA: Diagnosis not present

## 2020-12-19 DIAGNOSIS — C3432 Malignant neoplasm of lower lobe, left bronchus or lung: Secondary | ICD-10-CM | POA: Diagnosis not present

## 2020-12-19 DIAGNOSIS — E1122 Type 2 diabetes mellitus with diabetic chronic kidney disease: Secondary | ICD-10-CM | POA: Diagnosis not present

## 2020-12-22 DIAGNOSIS — Z8673 Personal history of transient ischemic attack (TIA), and cerebral infarction without residual deficits: Secondary | ICD-10-CM | POA: Diagnosis not present

## 2020-12-22 DIAGNOSIS — Z86711 Personal history of pulmonary embolism: Secondary | ICD-10-CM | POA: Diagnosis not present

## 2020-12-22 DIAGNOSIS — Z960 Presence of urogenital implants: Secondary | ICD-10-CM | POA: Diagnosis not present

## 2020-12-22 DIAGNOSIS — Z794 Long term (current) use of insulin: Secondary | ICD-10-CM | POA: Diagnosis not present

## 2020-12-22 DIAGNOSIS — G9341 Metabolic encephalopathy: Secondary | ICD-10-CM | POA: Diagnosis not present

## 2020-12-22 DIAGNOSIS — N1831 Chronic kidney disease, stage 3a: Secondary | ICD-10-CM | POA: Diagnosis not present

## 2020-12-22 DIAGNOSIS — Z7984 Long term (current) use of oral hypoglycemic drugs: Secondary | ICD-10-CM | POA: Diagnosis not present

## 2020-12-22 DIAGNOSIS — Z9181 History of falling: Secondary | ICD-10-CM | POA: Diagnosis not present

## 2020-12-22 DIAGNOSIS — E785 Hyperlipidemia, unspecified: Secondary | ICD-10-CM | POA: Diagnosis not present

## 2020-12-22 DIAGNOSIS — K5730 Diverticulosis of large intestine without perforation or abscess without bleeding: Secondary | ICD-10-CM | POA: Diagnosis not present

## 2020-12-22 DIAGNOSIS — C3432 Malignant neoplasm of lower lobe, left bronchus or lung: Secondary | ICD-10-CM | POA: Diagnosis not present

## 2020-12-22 DIAGNOSIS — C7931 Secondary malignant neoplasm of brain: Secondary | ICD-10-CM | POA: Diagnosis not present

## 2020-12-22 DIAGNOSIS — E7800 Pure hypercholesterolemia, unspecified: Secondary | ICD-10-CM | POA: Diagnosis not present

## 2020-12-22 DIAGNOSIS — E1122 Type 2 diabetes mellitus with diabetic chronic kidney disease: Secondary | ICD-10-CM | POA: Diagnosis not present

## 2020-12-22 DIAGNOSIS — E1165 Type 2 diabetes mellitus with hyperglycemia: Secondary | ICD-10-CM | POA: Diagnosis not present

## 2020-12-22 DIAGNOSIS — I10 Essential (primary) hypertension: Secondary | ICD-10-CM | POA: Diagnosis not present

## 2020-12-23 ENCOUNTER — Other Ambulatory Visit: Payer: Self-pay | Admitting: *Deleted

## 2020-12-23 ENCOUNTER — Other Ambulatory Visit: Payer: Self-pay | Admitting: Family Medicine

## 2020-12-23 ENCOUNTER — Encounter: Payer: Self-pay | Admitting: Internal Medicine

## 2020-12-23 DIAGNOSIS — E7800 Pure hypercholesterolemia, unspecified: Secondary | ICD-10-CM

## 2020-12-23 MED ORDER — LEVETIRACETAM 500 MG PO TABS: 500.0000 mg | ORAL_TABLET | Freq: Two times a day (BID) | ORAL | 0 refills | Status: DC

## 2020-12-24 ENCOUNTER — Other Ambulatory Visit: Payer: Self-pay

## 2020-12-24 ENCOUNTER — Ambulatory Visit: Payer: Medicare Other | Admitting: *Deleted

## 2020-12-24 DIAGNOSIS — N138 Other obstructive and reflux uropathy: Secondary | ICD-10-CM

## 2020-12-24 DIAGNOSIS — R338 Other retention of urine: Secondary | ICD-10-CM | POA: Diagnosis not present

## 2020-12-24 DIAGNOSIS — N179 Acute kidney failure, unspecified: Secondary | ICD-10-CM | POA: Diagnosis not present

## 2020-12-24 DIAGNOSIS — N401 Enlarged prostate with lower urinary tract symptoms: Secondary | ICD-10-CM | POA: Diagnosis not present

## 2020-12-24 MED ORDER — ATORVASTATIN CALCIUM 40 MG PO TABS: 40.0000 mg | ORAL_TABLET | Freq: Every day | ORAL | 0 refills | Status: DC

## 2020-12-24 NOTE — Progress Notes (Signed)
Plug the cathter and got a urine sample

## 2020-12-25 DIAGNOSIS — N1831 Chronic kidney disease, stage 3a: Secondary | ICD-10-CM | POA: Diagnosis not present

## 2020-12-25 DIAGNOSIS — E1165 Type 2 diabetes mellitus with hyperglycemia: Secondary | ICD-10-CM | POA: Diagnosis not present

## 2020-12-25 DIAGNOSIS — C3432 Malignant neoplasm of lower lobe, left bronchus or lung: Secondary | ICD-10-CM | POA: Diagnosis not present

## 2020-12-25 DIAGNOSIS — G9341 Metabolic encephalopathy: Secondary | ICD-10-CM | POA: Diagnosis not present

## 2020-12-25 DIAGNOSIS — E1122 Type 2 diabetes mellitus with diabetic chronic kidney disease: Secondary | ICD-10-CM | POA: Diagnosis not present

## 2020-12-25 DIAGNOSIS — C7931 Secondary malignant neoplasm of brain: Secondary | ICD-10-CM | POA: Diagnosis not present

## 2020-12-26 DIAGNOSIS — N1831 Chronic kidney disease, stage 3a: Secondary | ICD-10-CM | POA: Diagnosis not present

## 2020-12-26 DIAGNOSIS — C3432 Malignant neoplasm of lower lobe, left bronchus or lung: Secondary | ICD-10-CM | POA: Diagnosis not present

## 2020-12-26 DIAGNOSIS — C7931 Secondary malignant neoplasm of brain: Secondary | ICD-10-CM | POA: Diagnosis not present

## 2020-12-26 DIAGNOSIS — G9341 Metabolic encephalopathy: Secondary | ICD-10-CM | POA: Diagnosis not present

## 2020-12-26 DIAGNOSIS — E1165 Type 2 diabetes mellitus with hyperglycemia: Secondary | ICD-10-CM | POA: Diagnosis not present

## 2020-12-26 DIAGNOSIS — E1122 Type 2 diabetes mellitus with diabetic chronic kidney disease: Secondary | ICD-10-CM | POA: Diagnosis not present

## 2020-12-26 LAB — MICROSCOPIC EXAMINATION

## 2020-12-26 LAB — URINALYSIS, COMPLETE
Bilirubin, UA: NEGATIVE
Ketones, UA: NEGATIVE
Nitrite, UA: NEGATIVE
Specific Gravity, UA: 1.01 (ref 1.005–1.030)
Urobilinogen, Ur: 0.2 mg/dL (ref 0.2–1.0)
pH, UA: 5 (ref 5.0–7.5)

## 2020-12-27 ENCOUNTER — Inpatient Hospital Stay: Payer: Medicare Other

## 2020-12-27 ENCOUNTER — Inpatient Hospital Stay (HOSPITAL_BASED_OUTPATIENT_CLINIC_OR_DEPARTMENT_OTHER): Payer: Medicare Other | Admitting: Internal Medicine

## 2020-12-27 ENCOUNTER — Encounter: Payer: Self-pay | Admitting: Internal Medicine

## 2020-12-27 ENCOUNTER — Telehealth: Payer: Self-pay

## 2020-12-27 ENCOUNTER — Other Ambulatory Visit: Payer: Self-pay

## 2020-12-27 DIAGNOSIS — C3412 Malignant neoplasm of upper lobe, left bronchus or lung: Secondary | ICD-10-CM

## 2020-12-27 DIAGNOSIS — R82998 Other abnormal findings in urine: Secondary | ICD-10-CM | POA: Diagnosis not present

## 2020-12-27 DIAGNOSIS — Z9189 Other specified personal risk factors, not elsewhere classified: Secondary | ICD-10-CM | POA: Diagnosis not present

## 2020-12-27 DIAGNOSIS — N390 Urinary tract infection, site not specified: Secondary | ICD-10-CM

## 2020-12-27 LAB — CBC WITH DIFFERENTIAL/PLATELET
Abs Immature Granulocytes: 0.09 10*3/uL — ABNORMAL HIGH (ref 0.00–0.07)
Basophils Absolute: 0 10*3/uL (ref 0.0–0.1)
Basophils Relative: 0 %
Eosinophils Absolute: 0.1 10*3/uL (ref 0.0–0.5)
Eosinophils Relative: 1 %
HCT: 41.5 % (ref 39.0–52.0)
Hemoglobin: 14.1 g/dL (ref 13.0–17.0)
Immature Granulocytes: 1 %
Lymphocytes Relative: 29 %
Lymphs Abs: 1.9 10*3/uL (ref 0.7–4.0)
MCH: 30.3 pg (ref 26.0–34.0)
MCHC: 34 g/dL (ref 30.0–36.0)
MCV: 89.2 fL (ref 80.0–100.0)
Monocytes Absolute: 0.7 10*3/uL (ref 0.1–1.0)
Monocytes Relative: 10 %
Neutro Abs: 4 10*3/uL (ref 1.7–7.7)
Neutrophils Relative %: 59 %
Platelets: 142 10*3/uL — ABNORMAL LOW (ref 150–400)
RBC: 4.65 MIL/uL (ref 4.22–5.81)
RDW: 15.4 % (ref 11.5–15.5)
WBC: 6.8 10*3/uL (ref 4.0–10.5)
nRBC: 0 % (ref 0.0–0.2)

## 2020-12-27 LAB — COMPREHENSIVE METABOLIC PANEL
ALT: 41 U/L (ref 0–44)
AST: 21 U/L (ref 15–41)
Albumin: 3.3 g/dL — ABNORMAL LOW (ref 3.5–5.0)
Alkaline Phosphatase: 65 U/L (ref 38–126)
Anion gap: 10 (ref 5–15)
BUN: 13 mg/dL (ref 8–23)
CO2: 24 mmol/L (ref 22–32)
Calcium: 9.5 mg/dL (ref 8.9–10.3)
Chloride: 103 mmol/L (ref 98–111)
Creatinine, Ser: 1.09 mg/dL (ref 0.61–1.24)
GFR, Estimated: >60 mL/min (ref >60)
Glucose, Bld: 325 mg/dL — ABNORMAL HIGH (ref 70–99)
Potassium: 4.1 mmol/L (ref 3.5–5.1)
Sodium: 137 mmol/L (ref 135–145)
Total Bilirubin: 0.5 mg/dL (ref 0.3–1.2)
Total Protein: 6.5 g/dL (ref 6.5–8.1)

## 2020-12-27 MED ORDER — SULFAMETHOXAZOLE-TRIMETHOPRIM 800-160 MG PO TABS: 1.0000 | ORAL_TABLET | Freq: Two times a day (BID) | ORAL | 0 refills | Status: DC

## 2020-12-27 NOTE — Assessment & Plan Note (Addendum)
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   #Currently on LORLATINIB 100 mg/day; Tolerating well except for  elevated blood sugar levels. [See below].   #Brain metastases - [facility] 2022]progressive disease in the brain-status post whole brain radiation last March 28th, 2022- we will plan to get MRI brain approximately mid to late May; will order at next viist.   # right hip pain- with movement- ? tyleonol TID prn.  If worse not improved then recommend imaging.  # Poorly controlled Blood glucose [also likely secondary to lorlatinib]/ 355 PBF;continue glipizide to 5 mg BID: continue Lantus 15 units at night.  Continue follow-up with PCP  # Bilateral PE & left lower extremity DVT: Continue hold Xarelto-given the recent hemorrhagic metastasis- STABLE.   # CKD stage III-creatinine 1.7 [GFR~49]- STABLE  # Peripheral Neuropathy- STABLE; ? 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    # DISPOSITION:  # follow up in 2 weeks-MD; labs cbc/cmp-Dr.B  Cc; Dr.Sowles.

## 2020-12-27 NOTE — Telephone Encounter (Signed)
He still has the catheter correct?  Please start Bactrim DS twice daily x10 days, thanks  Nickolas Madrid, MD 12/27/2020

## 2020-12-27 NOTE — Telephone Encounter (Signed)
Incoming call from pt's daughter requesting urine results. Informed daughter urine cx is still pending. Advised daughter I would have provider review UA and get recommendations. Pt denies fever, chills, or pain. Daughter states patient is "confused" and she believes he may have infection. Please advise.

## 2020-12-27 NOTE — Progress Notes (Signed)
[place] OFFICE PROGRESS NOTE  Patient Care Team: [name], [name], [name], [name], [facility] (Pharmacist)  Cancer Staging No matching staging information was found for the patient.   Oncology History Overview Note  # OCT 2017- ADENO CA LUNG; STAGE IV [; LUL; bil supraclavicular LN; Left neck LN Bx]; ROS-1 MUTATED; s/p Carbo-alimta x1[oct 2017]  # NOV 1st 2017- XALKORI 250 mg BID; JAN 15th CT- PR;  # AUG 27th Chemo-RT to persistent LUL/mediastinal LN [s/p carbo-taxol- with RT; finished Sep 22nd 2018];   # July 14th 2020- multiple metastatic lesions of the brain [July 30 whole brain radiation finished April 21, 2019]; bone scan skull metastases /posterior left ninth rib metastases; April 04, 2019 stopped crizotinib;  #April 25, 2019-start Entrectinib 600 mg once a day; STopped in NOV 2020 [dizziness]; NOV 2020-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  # MRI brain- multiple infarcts [2d echo/bubble study-NEG's/p Neurology eval]  ------------------------------------------------------------    # # s/p TURP [Sep 2017; Dr.Cope] DEC 26th CT- distended bladder;  # Elevated PSA-  FEB 2021- 12;  S/p Bx- Negative; follow with urology/Dr.Stoioff   # MOLECULAR TESTING- ROS-1 POSITIVE; ALK/EGFR-NEG; PDL-1 EXPRESSION-  90%** [HIGH]  # DEC 15th 2020- PALLIATIVE CARE -----------------------------------------------------------------    DIAGNOSIS: 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        INTERVAL HISTORY:  Melvin Willis 70 y.o.  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  Patient is currently off steroids.  States his blood sugars have been elevated at 160 fasting at home.   Continues to feel poorly fatigued.  No fevers or chills.  Continues to have Foley catheter.  Awaiting surgical procedure for his urinary obstruction.  Review of Systems  Constitutional: Positive for malaise/fatigue. Negative for chills, diaphoresis, fever and weight loss.  HENT: Negative for nosebleeds and sore throat.   Eyes: Negative for double vision.  Respiratory: Negative for cough, hemoptysis, sputum production, shortness of breath and wheezing.   Cardiovascular: Negative for chest pain (Left upper chest wall pain chronic), palpitations and orthopnea.  Gastrointestinal: Negative for abdominal pain, blood in stool, constipation, diarrhea, heartburn and melena.  Genitourinary: Negative for dysuria.  Musculoskeletal: Positive for joint pain. Negative for back pain.  Skin: Negative.  Negative for itching  and rash.  Neurological:  Positive for tingling. Negative for focal weakness and weakness.  Endo/Heme/Allergies: Does not bruise/bleed easily.  Psychiatric/Behavioral: Positive for memory loss. Negative for depression. The patient is not nervous/anxious and does not have insomnia.     PAST MEDICAL HISTORY :  Past Medical History:  Diagnosis Date  . Abnormal prostate specific antigen 08/08/2012  . Adiposity 04/16/2015  . Chronic kidney disease (CKD), stage III (moderate) ([place]) 11/27/2016  . CVA (cerebral vascular accident) ([place]) 06/17/2016  . Diabetes mellitus without complication ([place])   . Diverticulosis of sigmoid colon 04/16/2015  . Dyslipidemia 03/18/2015  . Hemorrhoids, internal 04/16/2015  . Hypercholesteremia 04/16/2015  . Hyperlipidemia   . Hypertension   . Primary cancer of left upper lobe of lung ([place])   . Pulmonary embolism ([place])   . Wears dentures    partial upper    PAST SURGICAL HISTORY :   Past Surgical History:  Procedure Laterality Date  . COLONOSCOPY    . COLONOSCOPY WITH PROPOFOL N/A 05/06/2015   Procedure: COLONOSCOPY WITH PROPOFOL;  Surgeon: Lucilla Lame, MD;  Location: [place];  Service: Endoscopy;  Laterality: N/A;  ASCENDING COLON POLYPS X 2 TERMINAL ILEUM BIOPSY RANDOM COLON BX. TRANSVERSE COLON POLYP SIGMOID COLON POLYP  . ESOPHAGOGASTRODUODENOSCOPY (EGD) WITH PROPOFOL N/A 05/06/2015   Procedure: ESOPHAGOGASTRODUODENOSCOPY (EGD) WITH PROPOFOL;  Surgeon: Lucilla Lame, MD;  Location: [place];  Service: Endoscopy;  Laterality: N/A;  GASTRIC BIOPSY X1  . IVC FILTER INSERTION N/A 12/29/2020   Procedure: IVC FILTER INSERTION;  Surgeon: Dwana Curd, MD;  Location: [place] [facility];  Service: Cardiovascular;  Laterality: N/A;  . KIDNEY STONE SURGERY  2017    FAMILY HISTORY :   Family History  Problem Relation Age of Onset  . Diabetes Mother   . Diabetes Father   . CAD Father   . Dementia Father   . Diabetes Sister   . Cancer Maternal Uncle        Prostate   . Cancer Cousin        prostate    SOCIAL HISTORY:   Social History   Tobacco Use  . Smoking status: Never Smoker  . Smokeless tobacco: Never Used  Vaping Use  . Vaping Use: Never used  Substance Use Topics  . Alcohol use: No    Alcohol/week: 0.0 standard drinks  . Drug use: No    ALLERGIES:  has No Known Allergies.  MEDICATIONS:  No current facility-administered medications for this visit.   No current outpatient medications on file.   Facility-Administered Medications Ordered in Other Visits  Medication Dose Route Frequency Provider Last Rate Last Admin  . 0.9 %  sodium chloride infusion   Intravenous Continuous Elgergawy, Silver Huguenin, MD 50 mL/hr at 12/30/20 0751 Infusion Verify at 12/30/20 0751  . atorvastatin (LIPITOR) tablet 40 mg  40 mg Oral Daily Tu, Ching T, DO   40 mg at 12/29/20 1246  . cefTRIAXone (ROCEPHIN) 1 g in sodium chloride 0.9 % 100 mL IVPB  1 g Intravenous Q24H Tu, Ching T, DO   Stopping Infusion hung by another clincian at 12/29/20 1800  . Chlorhexidine Gluconate Cloth 2 % PADS 6 each  6 each Topical Daily Elgergawy, Silver Huguenin, MD   6 each at 12/29/20 1248  . finasteride (PROSCAR) tablet 5 mg  5 mg Oral Daily Tu, Ching T, DO   5 mg at 12/29/20 1246  . gabapentin (NEURONTIN) capsule 300 mg  300 mg Oral QHS Tu,  Ching T, DO   300 mg at 12/29/20 0215  . insulin aspart (novoLOG) injection 0-5 Units  0-5 Units Subcutaneous QHS Tu, Ching T, DO      . insulin aspart (novoLOG) injection 0-9 Units  0-9 Units Subcutaneous TID WC Tu, Ching T, DO      . levETIRAcetam (KEPPRA) IVPB 500 mg/100 mL premix  500 mg Intravenous Q12H Sharion Settler, NP   Stopped at 12/30/20 0750  . lorlatinib (LORBRENA) tablet 100 mg  100 mg Oral Daily Tu, Ching T, DO      . magic mouthwash  5 mL Oral QID Elgergawy, Silver Huguenin, MD   5 mL at 12/29/20 1722  . tamsulosin (FLOMAX) capsule 0.4 mg  0.4 mg Oral q AM Tu, Ching T, DO   0.4 mg at 12/29/20 5697    PHYSICAL EXAMINATION: ECOG PERFORMANCE  STATUS: 0 - Asymptomatic  BP 128/90 (BP Location: Right Arm)   Pulse (!) 105   Temp 98 F (36.7 C)   Resp 16   Wt 161 lb 6.4 oz (73.2 kg)   SpO2 98%   BMI 27.70 kg/m   Filed Weights   12/27/20 1427  Weight: 161 lb 6.4 oz (73.2 kg)    Physical Exam Constitutional:      Comments: Patient is accompanied by his wife.  He is in a wheelchair.  HENT:     Head: Normocephalic and atraumatic.     Mouth/Throat:     Pharynx: No oropharyngeal exudate.  Eyes:     Pupils: Pupils are equal, round, and reactive to light.  Cardiovascular:     Rate and Rhythm: Normal rate and regular rhythm.  Pulmonary:     Effort: No respiratory distress.     Breath sounds: No wheezing.     Comments: Decreased breath sounds bilaterally no wheeze or crackles. Abdominal:     General: Bowel sounds are normal. There is no distension.     Palpations: Abdomen is soft. There is no mass.     Tenderness: There is no abdominal tenderness. There is no guarding or rebound.  Musculoskeletal:        General: No tenderness. Normal range of motion.     Cervical back: Normal range of motion and neck supple.  Skin:    General: Skin is warm.  Neurological:     Mental Status: He is alert and oriented to person, place, and time.  Psychiatric:        Mood and Affect: Affect normal.    LABORATORY DATA:  I have reviewed the data as listed    Component Value Date/Time   NA 137 12/30/2020 0444   NA 139 01/01/2016 1000   K 5.0 12/30/2020 0444   CL 106 12/30/2020 0444   CO2 24 12/30/2020 0444   GLUCOSE 209 (H) 12/30/2020 0444   BUN 14 12/30/2020 0444   BUN 13 01/01/2016 1000   CREATININE 1.21 12/30/2020 0444   CREATININE 1.58 (H) 07/26/2020 1014   CALCIUM 9.5 12/30/2020 0444   PROT 6.2 (L) 12/30/2020 0444   PROT 7.2 01/01/2016 1000   ALBUMIN 2.8 (L) 12/30/2020 0444   ALBUMIN 4.4 01/01/2016 1000   AST 17 12/30/2020 0444   ALT 27 12/30/2020 0444   ALKPHOS 55 12/30/2020 0444   BILITOT 0.6 12/30/2020 0444    BILITOT 0.4 01/01/2016 1000   GFRNONAA >60 12/30/2020 0444   GFRNONAA 45 (L) 07/26/2020 1014   GFRAA 52 (L) 07/26/2020 1014    No results found for: SPEP,  UPEP  Lab Results  Component Value Date   WBC 6.4 12/30/2020   NEUTROABS 4.7 12/28/2020   HGB 13.4 12/30/2020   HCT 39.4 12/30/2020   MCV 88.1 12/30/2020   PLT 148 (L) 12/30/2020      Chemistry      Component Value Date/Time   NA 137 12/30/2020 0444   NA 139 01/01/2016 1000   K 5.0 12/30/2020 0444   CL 106 12/30/2020 0444   CO2 24 12/30/2020 0444   BUN 14 12/30/2020 0444   BUN 13 01/01/2016 1000   CREATININE 1.21 12/30/2020 0444   CREATININE 1.58 (H) 07/26/2020 1014      Component Value Date/Time   CALCIUM 9.5 12/30/2020 0444   ALKPHOS 55 12/30/2020 0444   AST 17 12/30/2020 0444   ALT 27 12/30/2020 0444   BILITOT 0.6 12/30/2020 0444   BILITOT 0.4 01/01/2016 1000       RADIOGRAPHIC STUDIES: I have personally reviewed the radiological images as listed and agreed with the findings in the report. MR BRAIN W WO CONTRAST  Result Date: 12/29/2020 CLINICAL DATA:  Lung cancer with brain metastases. Status post whole brain radiation. EXAM: MRI HEAD WITHOUT AND WITH CONTRAST TECHNIQUE: Multiplanar, multiecho pulse sequences of the brain and surrounding structures were obtained without and with intravenous contrast. CONTRAST:  30mL GADAVIST GADOBUTROL 1 MMOL/ML IV SOLN COMPARISON:  11/14/2020 FINDINGS: Brain: The enhancing lesion anteriorly in the left temporal lobe has decreased in size, now measuring 14 mm (series 19, image 58, previously 19 mm) with decreased edema. A 3 mm focus of enhancement slightly more superiorly and laterally in the left temporal lobe (series 19, image 70), a 6 mm enhancing lesion in the anterior midline of the midbrain (series 19, image 76), and an 11 mm enhancing lesion in the anterior left frontal lobe (series 19, image 82) are all unchanged. No new enhancing brain lesion is identified. Multiple areas  of chronic hemorrhage are again noted in both cerebral hemispheres including chronic hemorrhage associated with the left temporal and left frontal lesions as well as hemosiderin stained encephalomalacia in the parieto-occipital regions bilaterally. There are unchanged small chronic right frontal infarcts. Outside of the left temporal lobe, confluent T2 hyperintensities in the cerebral white matter bilaterally are stable to minimally increased and reflect in part post radiation changes. No midline shift, acute infarct, or extra-axial fluid collection is identified. 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 There is moderate cerebral atrophy. Vascular: Major intracranial vascular flow voids are preserved. Skull and upper cervical spine: No suspicious marrow lesion. Sinuses/Orbits: Unremarkable orbits. Left maxillary sinus mucous retention cyst. Persistent large right mastoid effusion. Other: None. IMPRESSION: 1. Decreased size of left temporal lobe metastasis with decreased edema. 2. Unchanged size of other lesions. 3. No evidence of new intracranial metastases or acute abnormality. Electronically Signed   By: Logan Bores M.D.   On: 12/29/2020 17:38   US Venous Img Lower Bilateral  Result Date: 12/28/2020 CLINICAL DATA:  Leg swelling for 1 week. Patient has a history of lung malignancy and prior DVT. EXAM: BILATERAL LOWER EXTREMITY VENOUS DOPPLER ULTRASOUND TECHNIQUE: Gray-scale sonography with graded compression, as well as color Doppler and duplex ultrasound were performed to evaluate the lower extremity deep venous systems from the level of the common femoral vein and including the common femoral, femoral, profunda femoral, popliteal and calf veins including the posterior tibial, peroneal and gastrocnemius veins when visible. The superficial great saphenous vein was also interrogated. Spectral Doppler was utilized to evaluate flow  at rest and with distal augmentation maneuvers in the common femoral,  femoral and popliteal veins. COMPARISON:  Lower extremity ultrasound dated 06/03/2016. FINDINGS: RIGHT LOWER EXTREMITY Common Femoral Vein: No evidence of thrombus. Normal compressibility, respiratory phasicity and response to augmentation. Saphenofemoral Junction: No evidence of thrombus. Normal compressibility and flow on color Doppler imaging. Profunda Femoral Vein: No evidence of thrombus. Normal compressibility and flow on color Doppler imaging. Femoral Vein: No evidence of thrombus. Normal compressibility, respiratory phasicity and response to augmentation. Popliteal Vein: No evidence of thrombus. Normal compressibility, respiratory phasicity and response to augmentation. Calf Veins: Thrombus is noted in the posterior tibial vein. Superficial Great Saphenous Vein: No evidence of thrombus. Normal compressibility. Venous Reflux:  None. Other Findings:  None. LEFT LOWER EXTREMITY Common Femoral Vein: No evidence of thrombus. Normal compressibility, respiratory phasicity and response to augmentation. Saphenofemoral Junction: No evidence of thrombus. Normal compressibility and flow on color Doppler imaging. Profunda Femoral Vein: No evidence of thrombus. Normal compressibility and flow on color Doppler imaging. Femoral Vein: No evidence of thrombus. Normal compressibility, respiratory phasicity and response to augmentation. Popliteal Vein: No evidence of thrombus. Normal compressibility, respiratory phasicity and response to augmentation. Calf Veins: Thrombus is noted in the posterior tibial vein. Superficial Great Saphenous Vein: No evidence of thrombus. Normal compressibility. Venous Reflux:  None. Other Findings:  None. IMPRESSION: Occlusive thrombus in the bilateral posterior tibial veins. Electronically Signed   By: Zerita Boers M.D.   On: 12/28/2020 18:57   DG Chest Portable 1 View  Result Date: 12/28/2020 CLINICAL DATA:  Shortness of breath. EXAM: PORTABLE CHEST 1 VIEW COMPARISON:  August 08, 2018  FINDINGS: Post treatment changes are seen in the left upper lobe. The heart, hila, mediastinum, lungs, and pleura are otherwise unremarkable. IMPRESSION: No acute interval change. 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   #Currently on LORLATINIB 100 mg/day; Tolerating well except for  elevated blood sugar levels. [See below].   #Brain metastases - [facility] 2022]progressive disease in the brain-status post whole brain radiation last March 28th, 2022- we will plan to get MRI brain approximately mid to late May; will order at next viist.   # right hip pain- with movement- ? tyleonol TID prn.  If worse not improved then recommend imaging.  # Poorly controlled Blood glucose [also likely secondary to lorlatinib]/ 355 PBF;continue glipizide to 5 mg BID: continue Lantus 15 units at night.  Continue follow-up with PCP  # Bilateral PE & left lower extremity DVT: Continue hold Xarelto-given the recent hemorrhagic metastasis- STABLE.   # CKD stage III-creatinine 1.7 [GFR~49]- STABLE  # Peripheral Neuropathy- STABLE; ? 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    # DISPOSITION:  # follow up in 2 weeks-MD; labs cbc/cmp-Dr.B  Cc; Dr.Sowles.    No orders of the defined types were placed in this encounter.  All questions were answered. The patient knows to call the clinic with any problems, questions or concerns.      Cammie Sickle, MD 12/30/2020 8:23 AM

## 2020-12-27 NOTE — Progress Notes (Deleted)
Name: Melvin Willis   MRN: [id number]    DOB: 1950-12-29   Date:12/27/2020       Progress Note  Subjective  Chief Complaint  Follow Up  HPI  DMII: glucose is out of control, A1C is over 10 %, he is not following a diabetic diet, not taking starlix, just went up on Metformin 7500 mg daily. We will try adding Soliqua, discussed possible side effects including nausea. He denies polyphagia, polydipsia but has polyuria - he has some BPH symptoms . He has been checking at home and has been 200's most of the time fasting  We will enroll him on community care services to help adjust dose of medication. Starting at 15 units   Chronic DVT left leg, history of PE : on long term Xarelto, rx given by Hematologist. He denies easy bruising. He was diagnosed with DVT and PE prior to cancer diagnosis back in 2017. He has some leg edema and wears compression stocking hoses daily to control symptoms. 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 Mickel Fuchs and Dr. Baruch Gouty, started on decadron and complete brain radiation in October and developed hearing loss but back to baseline now. He has some memory changes and intermittent dizziness since radiation   History of CVA: initially had dizziness and left side weakness, but no sequela now, still on statin and also on Xarelto. He has intermittent dizziness and fell last week. He also seems to have some memory issues lately, wife did most of the talking. She states he has been forgetful and is getting progressively worse   Atherosclerosis of aorta: he states taking Atorvastatin daily now , denies side effects   CKI stage III: last GFR 47  he denies pruritis, good urine output , continue to monitor  Patient Active Problem List   Diagnosis Date Noted  . Delirium   . Palliative care encounter   . Pneumonia due to infectious  organism   . Sepsis ([place]) 11/14/2020  . Septic shock ([place]) 11/14/2020  . Acute metabolic encephalopathy 32/08/2481  . Acute respiratory failure ([place]) 11/14/2020  . Transaminitis 11/14/2020  . Supratherapeutic INR 11/14/2020  . Acute-on-chronic kidney injury ([place]) 11/14/2020  . Brain metastasis ([place]) 09/25/2019  . Atherosclerosis of aorta ([place]) 09/04/2019  . Goals of care, counseling/discussion 08/02/2019  . Primary malignant neoplasm of lung metastatic to other site ([place]) 04/07/2019  . Chronic deep vein thrombosis (DVT) of left lower extremity ([place]) 01/05/2018  . Chronic kidney disease (CKD), stage III (moderate) ([place]) 11/27/2016  . Old cerebrovascular accident (CVA) without late effect   . Blurred vision, bilateral 06/23/2016  . Primary cancer of left upper lobe of lung ([place]) 06/12/2016  . Mediastinal mass   . History of nephrolithiasis 03/27/2016  . Pharyngeal dysphagia 01/01/2016  . Benign neoplasm of ascending colon   . Benign neoplasm of sigmoid colon   . Benign neoplasm of transverse colon   . Diverticulosis of large intestine without diverticulitis   . Overweight (BMI 25.0-29.9) 04/16/2015  . Type 2 diabetes mellitus with stage 3 chronic kidney disease ([place]) 03/18/2015  . Dyslipidemia 03/18/2015  . Disorder of male genital organ 08/08/2012  . 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 Elevated PSA 08/08/2012    Past Surgical History:  Procedure Laterality Date  . COLONOSCOPY    . COLONOSCOPY WITH PROPOFOL N/A 05/06/2015  Procedure: COLONOSCOPY WITH PROPOFOL;  Surgeon: Lucilla Lame, MD;  Location: [facility];  Service: Endoscopy;  Laterality: N/A;  ASCENDING COLON POLYPS X 2 TERMINAL ILEUM BIOPSY RANDOM COLON BX. TRANSVERSE COLON POLYP SIGMOID COLON POLYP  . ESOPHAGOGASTRODUODENOSCOPY (EGD) WITH PROPOFOL N/A 05/06/2015   Procedure: ESOPHAGOGASTRODUODENOSCOPY (EGD) WITH PROPOFOL;  Surgeon: Lucilla Lame, MD;  Location: [facility];  Service:  Endoscopy;  Laterality: N/A;  GASTRIC BIOPSY X1  . KIDNEY STONE SURGERY  2017    Family History  Problem Relation Age of Onset  . Diabetes Mother   . Diabetes Father   . CAD Father   . Dementia Father   . Diabetes Sister   . Cancer Maternal Uncle        Prostate  . Cancer Cousin        prostate    Social History   Tobacco Use  . Smoking status: Never Smoker  . Smokeless tobacco: Never Used  Substance Use Topics  . Alcohol use: No    Alcohol/week: 0.0 standard drinks     Current Outpatient Medications:  .  atorvastatin (LIPITOR) 40 MG tablet, Take 1 tablet (40 mg total) by mouth daily., Disp: 90 tablet, Rfl: 0 .  blood glucose meter kit and supplies, Dispense based on patient and insurance preference (Accuchek Aviva Plus, Accuchek Nano). Use up to four times daily as directed. (FOR ICD-10 E10.9, E11.9)., Disp: 1 each, Rfl: 0 .  finasteride (PROSCAR) 5 MG tablet, Take 1 tablet (5 mg total) by mouth daily., Disp: 90 tablet, Rfl: 3 .  gabapentin (NEURONTIN) 300 MG capsule, Take 1 capsule by mouth at bedtime (Patient taking differently: Take 300 mg by mouth at bedtime.), Disp: 90 capsule, Rfl: 1 .  glipiZIDE (GLUCOTROL) 5 MG tablet, Take 1 tablet (5 mg total) by mouth 2 (two) times daily before a meal., Disp: 60 tablet, Rfl: 3 .  Insulin Glargine-Lixisenatide 100-33 UNT-MCG/ML SOPN, Inject 15 Units into the skin daily with lunch., Disp: , Rfl:  .  Insulin Pen Needle (NOVOFINE PEN NEEDLE) 32G X 6 MM MISC, 1 each by Does not apply route daily., Disp: 100 each, Rfl: 2 .  levETIRAcetam (KEPPRA) 500 MG tablet, Take 1 tablet (500 mg total) by mouth 2 (two) times daily., Disp: 60 tablet, Rfl: 0 .  lorlatinib (LORBRENA) 100 MG tablet, Take 1 tablet (100 mg total) by mouth daily. Swallow tablets whole. Do not chew, crush or split tablets., Disp: 30 tablet, Rfl: 6 .  sulfamethoxazole-trimethoprim (BACTRIM DS) 800-160 MG tablet, Take 1 tablet by mouth 2 (two) times daily for 10 days. (Patient  not taking: Reported on 12/27/2020), Disp: 20 tablet, Rfl: 0 .  tamsulosin (FLOMAX) 0.4 MG CAPS capsule, Take 1 capsule by mouth once daily in the morning (Patient taking differently: Take 0.4 mg by mouth in the morning.), Disp: 90 capsule, Rfl: 3  No Known Allergies  I personally reviewed {Reviewed:[id number]} with the patient/caregiver today.   ROS  ***  Objective  There were no vitals filed for this visit.  There is no height or weight on file to calculate BMI.  Physical Exam ***  Recent Results (from the past 2160 hour(s))  Comprehensive metabolic panel     Status: Abnormal   Collection Time: 10/07/20  9:53 AM  Result Value Ref Range   Sodium 134 (L) 135 - 145 mmol/L   Potassium 4.3 3.5 - 5.1 mmol/L   Chloride 100 98 - 111 mmol/L   CO2 23 22 - 32 mmol/L  Glucose, Bld 184 (H) 70 - 99 mg/dL    Comment: Glucose reference range applies only to samples taken after fasting for at least 8 hours.   BUN 23 8 - 23 mg/dL   Creatinine, Ser 1.60 (H) 0.61 - 1.24 mg/dL   Calcium 9.2 8.9 - 10.3 mg/dL   Total Protein 7.5 6.5 - 8.1 g/dL   Albumin 4.2 3.5 - 5.0 g/dL   AST 22 15 - 41 U/L   ALT 30 0 - 44 U/L   Alkaline Phosphatase 52 38 - 126 U/L   Total Bilirubin 0.6 0.3 - 1.2 mg/dL   GFR, Estimated 47 (L) >60 mL/min    Comment: (NOTE) Calculated using the CKD-EPI Creatinine Equation (2021)    Anion gap 11 5 - 15    Comment: Performed at [facility], [address] [place], [place] [address]  CBC with Differential     Status: None   Collection Time: 10/07/20  9:53 AM  Result Value Ref Range   WBC 5.1 4.0 - 10.5 K/uL   RBC 4.80 4.22 - 5.81 MIL/uL   Hemoglobin 14.3 13.0 - 17.0 g/dL   HCT 41.9 39.0 - 52.0 %   MCV 87.3 80.0 - 100.0 fL   MCH 29.8 26.0 - 34.0 pg   MCHC 34.1 30.0 - 36.0 g/dL   RDW 14.4 11.5 - 15.5 %   Platelets 189 150 - 400 K/uL   nRBC 0.0 0.0 - 0.2 %   Neutrophils Relative % 43 %   Neutro Abs 2.3 1.7 - 7.7 K/uL   Lymphocytes Relative 41 %   Lymphs Abs 2.1  0.7 - 4.0 K/uL   Monocytes Relative 10 %   Monocytes Absolute 0.5 0.1 - 1.0 K/uL   Eosinophils Relative 4 %   Eosinophils Absolute 0.2 0.0 - 0.5 K/uL   Basophils Relative 1 %   Basophils Absolute 0.0 0.0 - 0.1 K/uL   Immature Granulocytes 1 %   Abs Immature Granulocytes 0.03 0.00 - 0.07 K/uL    Comment: Performed at [facility], [address] [place], [place] [address]  POCT HgB A1C     Status: Abnormal   Collection Time: 10/31/20 10:06 AM  Result Value Ref Range   Hemoglobin A1C 10.5 (A) 4.0 - 5.6 %   HbA1c POC (<> result, manual entry)     HbA1c, POC (prediabetic range)     HbA1c, POC (controlled diabetic range)    Lactic acid, plasma     Status: Abnormal   Collection Time: 11/14/20  2:53 PM  Result Value Ref Range   Lactic Acid, Venous 5.3 (HH) 0.5 - 1.9 mmol/L    Comment: CRITICAL RESULT CALLED TO, READ BACK BY AND VERIFIED WITH ROBIN REGISTER AT 1555 11/14/20 DAS Performed at [facility], [address] [place], [place] [address]   Comprehensive metabolic panel     Status: Abnormal   Collection Time: 11/14/20  2:53 PM  Result Value Ref Range   Sodium 135 135 - 145 mmol/L   Potassium 5.0 3.5 - 5.1 mmol/L   Chloride 101 98 - 111 mmol/L   CO2 21 (L) 22 - 32 mmol/L   Glucose, Bld 252 (H) 70 - 99 mg/dL    Comment: Glucose reference range applies only to samples taken after fasting for at least 8 hours.   BUN 31 (H) 8 - 23 mg/dL   Creatinine, Ser 2.18 (H) 0.61 - 1.24 mg/dL   Calcium 9.5 8.9 - 10.3 mg/dL  Total Protein 7.8 6.5 - 8.1 g/dL   Albumin 4.1 3.5 - 5.0 g/dL   AST 1,838 (H) 15 - 41 U/L   ALT 1,358 (H) 0 - 44 U/L   Alkaline Phosphatase 211 (H) 38 - 126 U/L   Total Bilirubin 4.5 (H) 0.3 - 1.2 mg/dL   GFR, Estimated 32 (L) >60 mL/min    Comment: (NOTE) Calculated using the CKD-EPI Creatinine Equation (2021)    Anion gap 13 5 - 15    Comment: Performed at [facility], [address] [place], [place] [address]  CBC WITH DIFFERENTIAL      Status: Abnormal   Collection Time: 11/14/20  2:53 PM  Result Value Ref Range   WBC 8.6 4.0 - 10.5 K/uL   RBC 5.03 4.22 - 5.81 MIL/uL   Hemoglobin 14.9 13.0 - 17.0 g/dL   HCT 44.0 39.0 - 52.0 %   MCV 87.5 80.0 - 100.0 fL   MCH 29.6 26.0 - 34.0 pg   MCHC 33.9 30.0 - 36.0 g/dL   RDW 14.7 11.5 - 15.5 %   Platelets 205 150 - 400 K/uL   nRBC 0.0 0.0 - 0.2 %   Neutrophils Relative % 89 %   Neutro Abs 7.6 1.7 - 7.7 K/uL   Lymphocytes Relative 4 %   Lymphs Abs 0.4 (L) 0.7 - 4.0 K/uL   Monocytes Relative 7 %   Monocytes Absolute 0.6 0.1 - 1.0 K/uL   Eosinophils Relative 0 %   Eosinophils Absolute 0.0 0.0 - 0.5 K/uL   Basophils Relative 0 %   Basophils Absolute 0.0 0.0 - 0.1 K/uL   WBC Morphology TOXIC GRANULATION    RBC Morphology MORPHOLOGY UNREMARKABLE    Smear Review Normal platelet morphology    Immature Granulocytes 0 %   Abs Immature Granulocytes 0.03 0.00 - 0.07 K/uL    Comment: Performed at [facility], [address] [place], [place] [address]  Protime-INR     Status: Abnormal   Collection Time: 11/14/20  2:53 PM  Result Value Ref Range   Prothrombin Time 30.0 (H) 11.4 - 15.2 seconds   INR 3.0 (H) 0.8 - 1.2    Comment: Performed at [facility], [place], [place] [address]  APTT     Status: Abnormal   Collection Time: 11/14/20  2:53 PM  Result Value Ref Range   aPTT 37 (H) 24 - 36 seconds    Comment:        IF BASELINE aPTT IS ELEVATED, SUGGEST PATIENT RISK ASSESSMENT BE USED TO DETERMINE APPROPRIATE ANTICOAGULANT THERAPY. Performed at [facility], [address] [place], [place] [address]   Blood Culture (routine x 2)     Status: Abnormal   Collection Time: 11/14/20  2:53 PM   Specimen: BLOOD  Result Value Ref Range   Specimen Description      BLOOD RIGHT ANTECUBITAL Performed at [facility], [address] [place], [place] [address]    Special Requests      BOTTLES DRAWN AEROBIC AND ANAEROBIC  Blood Culture adequate volume Performed at [facility], [address] [place], [place] [address]    Culture  Setup Time      GRAM NEGATIVE RODS IN BOTH AEROBIC AND ANAEROBIC BOTTLES CRITICAL VALUE NOTED.  VALUE IS CONSISTENT WITH PREVIOUSLY REPORTED AND CALLED VALUE. Performed at [facility], [address], [place], [place] [address]    Culture (A)     KLEBSIELLA PNEUMONIAE  SUSCEPTIBILITIES PERFORMED ON PREVIOUS CULTURE WITHIN THE LAST 5 DAYS. Performed at [facility], [place] [address], [place], [place] [address]    Report Status 11/17/2020 FINAL   Brain natriuretic peptide     Status: Abnormal   Collection Time: 11/14/20  2:53 PM  Result Value Ref Range   B Natriuretic Peptide 151.4 (H) 0.0 - 100.0 pg/mL    Comment: Performed at [facility], [place], [place] [address]  Troponin I (High Sensitivity)     Status: Abnormal   Collection Time: 11/14/20  2:53 PM  Result Value Ref Range   Troponin I (High Sensitivity) 130 (HH) <18 ng/L    Comment: CRITICAL RESULT CALLED TO, READ BACK BY AND VERIFIED WITH ROBIN REGISTER AT 1608 11/14/20 DAS (NOTE) Elevated high sensitivity troponin I (hsTnI) values and significant  changes across serial measurements may suggest ACS but many other  chronic and acute conditions are known to elevate hsTnI results.  Refer to the Links section for chest pain algorithms and additional  guidance. Performed at [facility], [place], [place] [address]   Resp Panel by RT-PCR (Flu A&B, Covid) Nasopharyngeal Swab     Status: None   Collection Time: 11/14/20  3:49 PM   Specimen: Nasopharyngeal Swab; Nasopharyngeal(NP) swabs in vial transport medium  Result Value Ref Range   SARS Coronavirus 2 by RT PCR NEGATIVE NEGATIVE    Comment: (NOTE) SARS-CoV-2 target nucleic acids are NOT DETECTED.  The SARS-CoV-2 RNA is generally detectable in upper respiratory specimens during the acute  phase of infection. The lowest concentration of SARS-CoV-2 viral copies this assay can detect is 138 copies/mL. A negative result does not preclude SARS-Cov-2 infection and should not be used as the sole basis for treatment or other patient management decisions. A negative result may occur with  improper specimen collection/handling, submission of specimen other than nasopharyngeal swab, presence of viral mutation(s) within the areas targeted by this assay, and inadequate number of viral copies(<138 copies/mL). A negative result must be combined with clinical observations, patient history, and epidemiological information. The expected result is Negative.  Fact Sheet for Patients:  EntrepreneurPulse.com.au  Fact Sheet for Healthcare Providers:  IncredibleEmployment.be  This test is no t yet approved or cleared by the [place] FDA and  has been authorized for detection and/or diagnosis of SARS-CoV-2 by FDA under an Emergency Use Authorization (EUA). This EUA will remain  in effect (meaning this test can be used) for the duration of the COVID-19 declaration under Section 564(b)(1) of the Act, 21 U.S.C.section 360bbb-3(b)(1), unless the authorization is terminated  or revoked sooner.       Influenza A by PCR NEGATIVE NEGATIVE   Influenza B by PCR NEGATIVE NEGATIVE    Comment: (NOTE) The Xpert Xpress SARS-CoV-2/FLU/RSV plus assay is intended as an aid in the diagnosis of influenza from Nasopharyngeal swab specimens and should not be used as a sole basis for treatment. Nasal washings and aspirates are unacceptable for Xpert Xpress SARS-CoV-2/FLU/RSV testing.  Fact Sheet for Patients: EntrepreneurPulse.com.au  Fact Sheet for Healthcare Providers: IncredibleEmployment.be  This test is not yet approved or cleared by the [place] FDA and has been authorized for detection and/or diagnosis of SARS-CoV-2  by FDA under an Emergency Use Authorization (EUA). This EUA will remain in effect (meaning this test can be used) for the duration of the COVID-19 declaration under Section 564(b)(1) of the Act, 21 U.S.C. section 360bbb-3(b)(1), unless the authorization is terminated  or revoked.  Performed at [facility], [address] [place], [place] [address]   Blood Culture (routine x 2)     Status: Abnormal   Collection Time: 11/14/20  3:49 PM   Specimen: BLOOD  Result Value Ref Range   Specimen Description      BLOOD LEFT ANTECUBITAL Performed at [facility], [address] [place], [place] [address]    Special Requests      BOTTLES DRAWN AEROBIC AND ANAEROBIC Blood Culture adequate volume Performed at [facility], [address], [place], [place] [address]    Culture  Setup Time      GRAM NEGATIVE RODS IN BOTH AEROBIC AND ANAEROBIC BOTTLES CRITICAL RESULT CALLED TO, READ BACK BY AND VERIFIED WITH: Tana Felts RN [phone] 11/15/20 HNM Performed at [facility], [address] [place], [place] [address]    Culture KLEBSIELLA PNEUMONIAE (A)    Report Status 11/17/2020 FINAL    Organism ID, Bacteria KLEBSIELLA PNEUMONIAE       Susceptibility   Klebsiella pneumoniae - MIC*    AMPICILLIN RESISTANT Resistant     CEFAZOLIN <=4 SENSITIVE Sensitive     CEFEPIME <=0.12 SENSITIVE Sensitive     CEFTAZIDIME <=1 SENSITIVE Sensitive     CEFTRIAXONE <=0.25 SENSITIVE Sensitive     CIPROFLOXACIN <=0.25 SENSITIVE Sensitive     GENTAMICIN <=1 SENSITIVE Sensitive     IMIPENEM <=0.25 SENSITIVE Sensitive     TRIMETH/SULFA <=20 SENSITIVE Sensitive     AMPICILLIN/SULBACTAM <=2 SENSITIVE Sensitive     PIP/TAZO <=4 SENSITIVE Sensitive     * KLEBSIELLA PNEUMONIAE  Blood Culture ID Panel (Reflexed)     Status: Abnormal   Collection Time: 11/14/20  3:49 PM  Result Value Ref Range   Enterococcus faecalis NOT DETECTED NOT DETECTED   Enterococcus Faecium NOT DETECTED NOT  DETECTED   Listeria monocytogenes NOT DETECTED NOT DETECTED   Staphylococcus species NOT DETECTED NOT DETECTED   Staphylococcus aureus (BCID) NOT DETECTED NOT DETECTED   Staphylococcus epidermidis NOT DETECTED NOT DETECTED   Staphylococcus lugdunensis NOT DETECTED NOT DETECTED   Streptococcus species NOT DETECTED NOT DETECTED   Streptococcus agalactiae NOT DETECTED NOT DETECTED   Streptococcus pneumoniae NOT DETECTED NOT DETECTED   Streptococcus pyogenes NOT DETECTED NOT DETECTED   A.calcoaceticus-baumannii NOT DETECTED NOT DETECTED   Bacteroides fragilis NOT DETECTED NOT DETECTED   Enterobacterales DETECTED (A) NOT DETECTED    Comment: Enterobacterales represent a large order of gram negative bacteria, not a single organism. CRITICAL RESULT CALLED TO, READ BACK BY AND VERIFIED WITH: Tana Felts RN 2130 11/15/20 HNM    Enterobacter cloacae complex NOT DETECTED NOT DETECTED   Escherichia coli NOT DETECTED NOT DETECTED   Klebsiella aerogenes NOT DETECTED NOT DETECTED   Klebsiella oxytoca NOT DETECTED NOT DETECTED   Klebsiella pneumoniae DETECTED (A) NOT DETECTED    Comment: CRITICAL RESULT CALLED TO, READ BACK BY AND VERIFIED WITH: Tana Felts QM [id number] 11/15/20 HNM    Proteus species NOT DETECTED NOT DETECTED   Salmonella species NOT DETECTED NOT DETECTED   Serratia marcescens NOT DETECTED NOT DETECTED   Haemophilus influenzae NOT DETECTED NOT DETECTED   Neisseria meningitidis NOT DETECTED NOT DETECTED   Pseudomonas aeruginosa NOT DETECTED NOT DETECTED   Stenotrophomonas maltophilia NOT DETECTED NOT DETECTED   Candida albicans NOT DETECTED NOT DETECTED   Candida auris NOT DETECTED NOT DETECTED   Candida glabrata NOT DETECTED NOT DETECTED   Candida krusei NOT DETECTED NOT DETECTED  Candida parapsilosis NOT DETECTED NOT DETECTED   Candida tropicalis NOT DETECTED NOT DETECTED   Cryptococcus neoformans/gattii NOT DETECTED NOT DETECTED   CTX-M ESBL NOT DETECTED NOT DETECTED    Carbapenem resistance IMP NOT DETECTED NOT DETECTED   Carbapenem resistance KPC NOT DETECTED NOT DETECTED   Carbapenem resistance NDM NOT DETECTED NOT DETECTED   Carbapenem resist OXA 48 LIKE NOT DETECTED NOT DETECTED   Carbapenem resistance VIM NOT DETECTED NOT DETECTED    Comment: Performed at [facility], [address] [place], [place] [address]  Lactic acid, plasma     Status: Abnormal   Collection Time: 11/14/20  5:25 PM  Result Value Ref Range   Lactic Acid, Venous 4.3 (HH) 0.5 - 1.9 mmol/L    Comment: CRITICAL VALUE NOTED. VALUE IS CONSISTENT WITH PREVIOUSLY REPORTED/CALLED VALUE SKL Performed at [facility], [place], [place] [address]   Troponin I (High Sensitivity)     Status: Abnormal   Collection Time: 11/14/20  7:40 PM  Result Value Ref Range   Troponin I (High Sensitivity) 175 (HH) <18 ng/L    Comment: CRITICAL VALUE NOTED. VALUE IS CONSISTENT WITH PREVIOUSLY REPORTED/CALLED VALUE SKL (NOTE) Elevated high sensitivity troponin I (hsTnI) values and significant  changes across serial measurements may suggest ACS but many other  chronic and acute conditions are known to elevate hsTnI results.  Refer to the "Links" section for chest pain algorithms and additional  guidance. Performed at [facility], [address] [place], [place] [address]   Hepatitis panel, acute     Status: None   Collection Time: 11/14/20  7:40 PM  Result Value Ref Range   Hepatitis B Surface Ag NON REACTIVE NON REACTIVE   HCV Ab NON REACTIVE NON REACTIVE    Comment: (NOTE) Nonreactive HCV antibody screen is consistent with no HCV infections,  unless recent infection is suspected or other evidence exists to indicate HCV infection.     Hep A IgM NON REACTIVE NON REACTIVE   Hep B C IgM NON REACTIVE NON REACTIVE    Comment: Performed at [facility], [place] [address], [place], [place] [address]  Lactic acid, plasma     Status: Abnormal    Collection Time: 11/14/20  7:40 PM  Result Value Ref Range   Lactic Acid, Venous 3.6 (HH) 0.5 - 1.9 mmol/L    Comment: CRITICAL VALUE NOTED. VALUE IS CONSISTENT WITH PREVIOUSLY REPORTED/CALLED VALUE SKL Performed at [facility], [address] [place], [place] [address]   CBG monitoring, ED     Status: Abnormal   Collection Time: 11/14/20 10:34 PM  Result Value Ref Range   Glucose-Capillary 266 (H) 70 - 99 mg/dL    Comment: Glucose reference range applies only to samples taken after fasting for at least 8 hours.  Urinalysis, Complete w Microscopic     Status: Abnormal   Collection Time: 11/15/20  4:20 AM  Result Value Ref Range   Color, Urine YELLOW (A) YELLOW   APPearance HAZY (A) CLEAR   Specific Gravity, Urine 1.013 1.005 - 1.030   pH 5.0 5.0 - 8.0   Glucose, UA 150 (A) NEGATIVE mg/dL   Hgb urine dipstick LARGE (A) NEGATIVE   Bilirubin Urine NEGATIVE NEGATIVE   Ketones, ur NEGATIVE NEGATIVE mg/dL   Protein, ur NEGATIVE NEGATIVE mg/dL   Nitrite NEGATIVE NEGATIVE   Leukocytes,Ua NEGATIVE NEGATIVE   WBC, UA 0-5 0 - 5 WBC/hpf   Bacteria, UA RARE (A) NONE  SEEN   Squamous Epithelial / LPF 0-5 0 - 5   Mucus PRESENT     Comment: Performed at [facility], [address] [place], [place] [address]  Urine culture     Status: None   Collection Time: 11/15/20  4:20 AM   Specimen: In/Out Cath Urine  Result Value Ref Range   Specimen Description      IN/OUT CATH URINE Performed at [facility], [address], [place], [place] [address]    Special Requests      NONE Performed at [facility], [address], [place], [place] [address]    Culture      NO GROWTH Performed at [facility], [place] [address], [place], [place] [address]    Report Status 11/16/2020 FINAL   CBC     Status: None   Collection Time: 11/15/20  4:20 AM  Result Value Ref Range   WBC 9.8 4.0 - 10.5 K/uL   RBC 4.86 4.22 - 5.81 MIL/uL   Hemoglobin 14.4 13.0 -  17.0 g/dL   HCT 42.9 39.0 - 52.0 %   MCV 88.3 80.0 - 100.0 fL   MCH 29.6 26.0 - 34.0 pg   MCHC 33.6 30.0 - 36.0 g/dL   RDW 14.6 11.5 - 15.5 %   Platelets 184 150 - 400 K/uL   nRBC 0.0 0.0 - 0.2 %    Comment: Performed at [facility], [address] [place], [place] [address]  Comprehensive metabolic panel     Status: Abnormal   Collection Time: 11/15/20  4:20 AM  Result Value Ref Range   Sodium 137 135 - 145 mmol/L   Potassium 4.3 3.5 - 5.1 mmol/L   Chloride 106 98 - 111 mmol/L   CO2 20 (L) 22 - 32 mmol/L   Glucose, Bld 285 (H) 70 - 99 mg/dL    Comment: Glucose reference range applies only to samples taken after fasting for at least 8 hours.   BUN 29 (H) 8 - 23 mg/dL   Creatinine, Ser 1.77 (H) 0.61 - 1.24 mg/dL   Calcium 9.1 8.9 - 10.3 mg/dL   Total Protein 7.4 6.5 - 8.1 g/dL   Albumin 3.7 3.5 - 5.0 g/dL   AST 629 (H) 15 - 41 U/L   ALT 899 (H) 0 - 44 U/L   Alkaline Phosphatase 175 (H) 38 - 126 U/L   Total Bilirubin 5.7 (H) 0.3 - 1.2 mg/dL   GFR, Estimated 42 (L) >60 mL/min    Comment: (NOTE) Calculated using the CKD-EPI Creatinine Equation (2021)    Anion gap 11 5 - 15    Comment: Performed at [facility], [address] [place], [place] [address]  Lactic acid, plasma     Status: Abnormal   Collection Time: 11/15/20  4:20 AM  Result Value Ref Range   Lactic Acid, Venous 3.3 (HH) 0.5 - 1.9 mmol/L    Comment: CRITICAL VALUE NOTED. VALUE IS CONSISTENT WITH PREVIOUSLY REPORTED/CALLED VALUE RH Performed at [facility], [address] [place], [place] [address]   Strep pneumoniae urinary antigen     Status: None   Collection Time: 11/15/20  4:20 AM  Result Value Ref Range   Strep Pneumo Urinary Antigen NEGATIVE NEGATIVE    Comment:        Infection due to S. pneumoniae cannot be absolutely ruled out since the antigen present may be below the detection limit of the test. Performed at [facility], [place] [address], [place],  [place] [address]   CBG monitoring, ED     Status: Abnormal   Collection Time: 11/15/20  8:04 AM  Result Value Ref Range   Glucose-Capillary 307 (H) 70 - 99 mg/dL    Comment: Glucose reference range applies only to samples taken after fasting for at least 8 hours.  CBG monitoring, ED     Status: Abnormal   Collection Time: 11/15/20 12:03 PM  Result Value Ref Range   Glucose-Capillary 232 (H) 70 - 99 mg/dL    Comment: Glucose reference range applies only to samples taken after fasting for at least 8 hours.  Glucose, capillary     Status: Abnormal   Collection Time: 11/15/20  3:48 PM  Result Value Ref Range   Glucose-Capillary 229 (H) 70 - 99 mg/dL    Comment: Glucose reference range applies only to samples taken after fasting for at least 8 hours.  Glucose, capillary     Status: Abnormal   Collection Time: 11/15/20  8:53 PM  Result Value Ref Range   Glucose-Capillary 277 (H) 70 - 99 mg/dL    Comment: Glucose reference range applies only to samples taken after fasting for at least 8 hours.  Lactic acid, plasma     Status: None   Collection Time: 11/16/20  5:31 AM  Result Value Ref Range   Lactic Acid, Venous 1.7 0.5 - 1.9 mmol/L    Comment: Performed at [facility], [address] [place], [place] [address]  Glucose, capillary     Status: Abnormal   Collection Time: 11/16/20 10:10 AM  Result Value Ref Range   Glucose-Capillary 284 (H) 70 - 99 mg/dL    Comment: Glucose reference range applies only to samples taken after fasting for at least 8 hours.  Glucose, capillary     Status: Abnormal   Collection Time: 11/16/20  1:16 PM  Result Value Ref Range   Glucose-Capillary 294 (H) 70 - 99 mg/dL    Comment: Glucose reference range applies only to samples taken after fasting for at least 8 hours.  Glucose, capillary     Status: Abnormal   Collection Time: 11/16/20  3:40 PM  Result Value Ref Range   Glucose-Capillary 254 (H) 70 - 99 mg/dL    Comment: Glucose reference range applies  only to samples taken after fasting for at least 8 hours.  Glucose, capillary     Status: Abnormal   Collection Time: 11/16/20  5:54 PM  Result Value Ref Range   Glucose-Capillary 214 (H) 70 - 99 mg/dL    Comment: Glucose reference range applies only to samples taken after fasting for at least 8 hours.  Glucose, capillary     Status: Abnormal   Collection Time: 11/16/20  7:59 PM  Result Value Ref Range   Glucose-Capillary 228 (H) 70 - 99 mg/dL    Comment: Glucose reference range applies only to samples taken after fasting for at least 8 hours.   Comment 1 Notify RN   Glucose, capillary     Status: Abnormal   Collection Time: 11/17/20  8:31 AM  Result Value Ref Range   Glucose-Capillary 255 (H) 70 - 99 mg/dL    Comment: Glucose reference range applies only to samples taken after fasting for at least 8 hours.  Glucose, capillary     Status: Abnormal   Collection Time: 11/17/20 12:06 PM  Result Value Ref Range   Glucose-Capillary 281 (H) 70 - 99 mg/dL  Comment: Glucose reference range applies only to samples taken after fasting for at least 8 hours.  CULTURE, BLOOD (ROUTINE X 2) w Reflex to ID Panel     Status: None   Collection Time: 11/17/20  1:00 PM   Specimen: BLOOD  Result Value Ref Range   Specimen Description BLOOD RIGHT ANTECUBITAL    Special Requests      BOTTLES DRAWN AEROBIC AND ANAEROBIC Blood Culture adequate volume   Culture      NO GROWTH 5 DAYS Performed at [facility], [address] [place], [place] [address]    Report Status 11/22/2020 FINAL   CULTURE, BLOOD (ROUTINE X 2) w Reflex to ID Panel     Status: None   Collection Time: 11/17/20  1:00 PM   Specimen: BLOOD  Result Value Ref Range   Specimen Description BLOOD BLOOD LEFT HAND    Special Requests      BOTTLES DRAWN AEROBIC AND ANAEROBIC Blood Culture adequate volume   Culture      NO GROWTH 5 DAYS Performed at [facility], [address], [place], [place] [address]    Report  Status 11/22/2020 FINAL   Hepatic function panel     Status: Abnormal   Collection Time: 11/17/20  2:06 PM  Result Value Ref Range   Total Protein 7.1 6.5 - 8.1 g/dL   Albumin 3.4 (L) 3.5 - 5.0 g/dL   AST 64 (H) 15 - 41 U/L   ALT 383 (H) 0 - 44 U/L   Alkaline Phosphatase 132 (H) 38 - 126 U/L   Total Bilirubin 1.2 0.3 - 1.2 mg/dL   Bilirubin, Direct 0.3 (H) 0.0 - 0.2 mg/dL   Indirect Bilirubin 0.9 0.3 - 0.9 mg/dL    Comment: Performed at [facility], [address] [place], [place] [address]  Glucose, capillary     Status: Abnormal   Collection Time: 11/17/20  4:42 PM  Result Value Ref Range   Glucose-Capillary 279 (H) 70 - 99 mg/dL    Comment: Glucose reference range applies only to samples taken after fasting for at least 8 hours.  Glucose, capillary     Status: Abnormal   Collection Time: 11/17/20  8:38 PM  Result Value Ref Range   Glucose-Capillary 258 (H) 70 - 99 mg/dL    Comment: Glucose reference range applies only to samples taken after fasting for at least 8 hours.  Glucose, capillary     Status: Abnormal   Collection Time: 11/18/20  9:19 AM  Result Value Ref Range   Glucose-Capillary 216 (H) 70 - 99 mg/dL    Comment: Glucose reference range applies only to samples taken after fasting for at least 8 hours.  Glucose, capillary     Status: Abnormal   Collection Time: 11/18/20 12:12 PM  Result Value Ref Range   Glucose-Capillary 281 (H) 70 - 99 mg/dL    Comment: Glucose reference range applies only to samples taken after fasting for at least 8 hours.  Glucose, capillary     Status: Abnormal   Collection Time: 11/18/20  4:15 PM  Result Value Ref Range   Glucose-Capillary 371 (H) 70 - 99 mg/dL    Comment: Glucose reference range applies only to samples taken after fasting for at least 8 hours.  Glucose, capillary     Status: Abnormal   Collection Time: 11/18/20  8:23 PM  Result Value Ref Range   Glucose-Capillary 306 (H) 70 - 99 mg/dL    Comment: Glucose  reference range applies only to samples taken after fasting for at least 8 hours.  CBC     Status: Abnormal   Collection Time: 11/19/20  5:57 AM  Result Value Ref Range   WBC 11.5 (H) 4.0 - 10.5 K/uL   RBC 4.86 4.22 - 5.81 MIL/uL   Hemoglobin 14.4 13.0 - 17.0 g/dL   HCT 42.1 39.0 - 52.0 %   MCV 86.6 80.0 - 100.0 fL   MCH 29.6 26.0 - 34.0 pg   MCHC 34.2 30.0 - 36.0 g/dL   RDW 15.2 11.5 - 15.5 %   Platelets 169 150 - 400 K/uL   nRBC 0.0 0.0 - 0.2 %    Comment: Performed at [facility], [address] [place], [place] [address]  Comprehensive metabolic panel     Status: Abnormal   Collection Time: 11/19/20  5:57 AM  Result Value Ref Range   Sodium 141 135 - 145 mmol/L   Potassium 5.0 3.5 - 5.1 mmol/L   Chloride 116 (H) 98 - 111 mmol/L   CO2 16 (L) 22 - 32 mmol/L   Glucose, Bld 271 (H) 70 - 99 mg/dL    Comment: Glucose reference range applies only to samples taken after fasting for at least 8 hours.   BUN 84 (H) 8 - 23 mg/dL   Creatinine, Ser 4.09 (H) 0.61 - 1.24 mg/dL   Calcium 9.1 8.9 - 10.3 mg/dL   Total Protein 7.0 6.5 - 8.1 g/dL   Albumin 3.2 (L) 3.5 - 5.0 g/dL   AST 27 15 - 41 U/L   ALT 218 (H) 0 - 44 U/L   Alkaline Phosphatase 106 38 - 126 U/L   Total Bilirubin 1.3 (H) 0.3 - 1.2 mg/dL   GFR, Estimated 15 (L) >60 mL/min    Comment: (NOTE) Calculated using the CKD-EPI Creatinine Equation (2021)    Anion gap 9 5 - 15    Comment: Performed at [facility], [address] [place], [place] [address]  Glucose, capillary     Status: Abnormal   Collection Time: 11/19/20 10:11 AM  Result Value Ref Range   Glucose-Capillary 265 (H) 70 - 99 mg/dL    Comment: Glucose reference range applies only to samples taken after fasting for at least 8 hours.  Glucose, capillary     Status: Abnormal   Collection Time: 11/19/20 12:33 PM  Result Value Ref Range   Glucose-Capillary 272 (H) 70 - 99 mg/dL    Comment: Glucose reference range applies only to samples taken  after fasting for at least 8 hours.  Legionella Pneumophila Serogp 1 Ur Ag     Status: None   Collection Time: 11/19/20  2:26 PM  Result Value Ref Range   L. pneumophila Serogp 1 Ur Ag Negative Negative    Comment: (NOTE) Presumptive negative for L. pneumophila serogroup 1 antigen in urine, suggesting no recent or current infection. Legionnaires' disease cannot be ruled out since other serogroups and species may also cause disease. Performed At: [facility] [place], [place] [id number] Rush Farmer MD [id number]    Source of Sample URINE, RANDOM     Comment: Performed at [facility], [address] [place], [place] [address]  Glucose, capillary     Status: Abnormal   Collection Time: 11/19/20  4:00 PM  Result Value Ref Range   Glucose-Capillary 266 (H) 70 - 99 mg/dL    Comment: Glucose reference range applies only to samples taken after fasting  for at least 8 hours.  Glucose, capillary     Status: Abnormal   Collection Time: 11/19/20  8:29 PM  Result Value Ref Range   Glucose-Capillary 195 (H) 70 - 99 mg/dL    Comment: Glucose reference range applies only to samples taken after fasting for at least 8 hours.  CBC     Status: Abnormal   Collection Time: 11/20/20  5:05 AM  Result Value Ref Range   WBC 10.3 4.0 - 10.5 K/uL   RBC 4.43 4.22 - 5.81 MIL/uL   Hemoglobin 13.1 13.0 - 17.0 g/dL   HCT 38.1 (L) 39.0 - 52.0 %   MCV 86.0 80.0 - 100.0 fL   MCH 29.6 26.0 - 34.0 pg   MCHC 34.4 30.0 - 36.0 g/dL   RDW 15.1 11.5 - 15.5 %   Platelets 149 (L) 150 - 400 K/uL   nRBC 0.0 0.0 - 0.2 %    Comment: Performed at [facility], [address], [place], [place] [address]  Basic metabolic panel     Status: Abnormal   Collection Time: 11/20/20  5:05 AM  Result Value Ref Range   Sodium 147 (H) 135 - 145 mmol/L   Potassium 4.8 3.5 - 5.1 mmol/L   Chloride 122 (H) 98 - 111 mmol/L   CO2 20 (L) 22 - 32 mmol/L   Glucose, Bld 156 (H) 70 - 99 mg/dL     Comment: Glucose reference range applies only to samples taken after fasting for at least 8 hours.   BUN 56 (H) 8 - 23 mg/dL   Creatinine, Ser 2.07 (H) 0.61 - 1.24 mg/dL   Calcium 8.9 8.9 - 10.3 mg/dL   GFR, Estimated 34 (L) >60 mL/min    Comment: (NOTE) Calculated using the CKD-EPI Creatinine Equation (2021)    Anion gap 5 5 - 15    Comment: Performed at [facility], [address] [place], [place] [address]  Glucose, capillary     Status: Abnormal   Collection Time: 11/20/20  7:23 AM  Result Value Ref Range   Glucose-Capillary 164 (H) 70 - 99 mg/dL    Comment: Glucose reference range applies only to samples taken after fasting for at least 8 hours.  Glucose, capillary     Status: Abnormal   Collection Time: 11/20/20 12:15 PM  Result Value Ref Range   Glucose-Capillary 180 (H) 70 - 99 mg/dL    Comment: Glucose reference range applies only to samples taken after fasting for at least 8 hours.  CBC with Differential     Status: Abnormal   Collection Time: 11/29/20  8:57 AM  Result Value Ref Range   WBC 9.7 4.0 - 10.5 K/uL   RBC 4.76 4.22 - 5.81 MIL/uL   Hemoglobin 14.2 13.0 - 17.0 g/dL   HCT 41.7 39.0 - 52.0 %   MCV 87.6 80.0 - 100.0 fL   MCH 29.8 26.0 - 34.0 pg   MCHC 34.1 30.0 - 36.0 g/dL   RDW 14.6 11.5 - 15.5 %   Platelets 185 150 - 400 K/uL   nRBC 0.0 0.0 - 0.2 %   Neutrophils Relative % 86 %   Neutro Abs 8.3 (H) 1.7 - 7.7 K/uL   Lymphocytes Relative 7 %   Lymphs Abs 0.7 0.7 - 4.0 K/uL   Monocytes Relative 6 %   Monocytes Absolute 0.6 0.1 - 1.0 K/uL   Eosinophils Relative 0 %   Eosinophils Absolute 0.0 0.0 - 0.5 K/uL  Basophils Relative 0 %   Basophils Absolute 0.0 0.0 - 0.1 K/uL   Immature Granulocytes 1 %   Abs Immature Granulocytes 0.06 0.00 - 0.07 K/uL    Comment: Performed at [facility], [address] [place], [place] [address]  Comprehensive metabolic panel     Status: Abnormal   Collection Time: 11/29/20  8:57 AM  Result Value Ref  Range   Sodium 131 (L) 135 - 145 mmol/L   Potassium 4.8 3.5 - 5.1 mmol/L   Chloride 99 98 - 111 mmol/L   CO2 23 22 - 32 mmol/L   Glucose, Bld 361 (H) 70 - 99 mg/dL    Comment: Glucose reference range applies only to samples taken after fasting for at least 8 hours.   BUN 24 (H) 8 - 23 mg/dL   Creatinine, Ser 1.16 0.61 - 1.24 mg/dL   Calcium 9.0 8.9 - 10.3 mg/dL   Total Protein 6.6 6.5 - 8.1 g/dL   Albumin 3.3 (L) 3.5 - 5.0 g/dL   AST 35 15 - 41 U/L   ALT 83 (H) 0 - 44 U/L   Alkaline Phosphatase 97 38 - 126 U/L   Total Bilirubin 1.1 0.3 - 1.2 mg/dL   GFR, Estimated >60 >60 mL/min    Comment: (NOTE) Calculated using the CKD-EPI Creatinine Equation (2021)    Anion gap 9 5 - 15    Comment: Performed at [facility], [address] [place], [place] [address]  BLADDER SCAN AMB NON-IMAGING     Status: None   Collection Time: 12/05/20  1:28 PM  Result Value Ref Range   Scan Result 731m   Bladder Scan (Post Void Residual) in office     Status: None   Collection Time: 12/12/20  2:39 PM  Result Value Ref Range   Scan Result >7322m  Comprehensive metabolic panel     Status: Abnormal   Collection Time: 12/13/20  9:01 AM  Result Value Ref Range   Sodium 133 (L) 135 - 145 mmol/L   Potassium 4.1 3.5 - 5.1 mmol/L   Chloride 101 98 - 111 mmol/L   CO2 22 22 - 32 mmol/L   Glucose, Bld 161 (H) 70 - 99 mg/dL    Comment: Glucose reference range applies only to samples taken after fasting for at least 8 hours.   BUN 21 8 - 23 mg/dL   Creatinine, Ser 1.04 0.61 - 1.24 mg/dL   Calcium 9.2 8.9 - 10.3 mg/dL   Total Protein 6.7 6.5 - 8.1 g/dL   Albumin 3.4 (L) 3.5 - 5.0 g/dL   AST 25 15 - 41 U/L   ALT 80 (H) 0 - 44 U/L   Alkaline Phosphatase 85 38 - 126 U/L   Total Bilirubin 0.7 0.3 - 1.2 mg/dL   GFR, Estimated >60 >60 mL/min    Comment: (NOTE) Calculated using the CKD-EPI Creatinine Equation (2021)    Anion gap 10 5 - 15    Comment: [facility] [id number] with Differential     Status: Abnormal   Collection Time: 12/13/20  9:01 AM  Result Value Ref Range   WBC 6.0 4.0 - 10.5 K/uL   RBC 4.87 4.22 - 5.81 MIL/uL   Hemoglobin 14.9 13.0 - 17.0 g/dL   HCT 42.5 39.0 - 52.0 %   MCV 87.3 80.0 - 100.0 fL   MCH 30.6 26.0 - 34.0 pg   MCHC 35.1  30.0 - 36.0 g/dL   RDW 15.3 11.5 - 15.5 %   Platelets 148 (L) 150 - 400 K/uL   nRBC 0.0 0.0 - 0.2 %   Neutrophils Relative % 68 %   Neutro Abs 4.1 1.7 - 7.7 K/uL   Lymphocytes Relative 19 %   Lymphs Abs 1.1 0.7 - 4.0 K/uL   Monocytes Relative 11 %   Monocytes Absolute 0.7 0.1 - 1.0 K/uL   Eosinophils Relative 1 %   Eosinophils Absolute 0.0 0.0 - 0.5 K/uL   Basophils Relative 0 %   Basophils Absolute 0.0 0.0 - 0.1 K/uL   Immature Granulocytes 1 %   Abs Immature Granulocytes 0.06 0.00 - 0.07 K/uL    Comment: Performed at [facility], [address] [place], [place] [address]  Lipid panel     Status: None   Collection Time: 12/13/20  9:01 AM  Result Value Ref Range   Cholesterol 159 0 - 200 mg/dL   Triglycerides 71 <150 mg/dL   HDL 59 >40 mg/dL   Total CHOL/HDL Ratio 2.7 RATIO   VLDL 14 0 - 40 mg/dL   LDL Cholesterol 86 0 - 99 mg/dL    Comment:        Total Cholesterol/HDL:CHD Risk Coronary Heart Disease Risk Table                     Men   Women  1/2 Average Risk   3.4   3.3  Average Risk       5.0   4.4  2 X Average Risk   9.6   7.1  3 X Average Risk  23.4   11.0        Use the calculated Patient Ratio above and the CHD Risk Table to determine the patient's CHD Risk.        ATP III CLASSIFICATION (LDL):  <100     mg/dL   Optimal  100-129  mg/dL   Near or Above                    Optimal  130-159  mg/dL   Borderline  160-189  mg/dL   High  >190     mg/dL   Very High Performed at [facility], [address] [place], [place] [address]   CULTURE, URINE COMPREHENSIVE     Status: None (Preliminary result)   Collection Time: 12/24/20 11:04  AM   Specimen: Urine   UR  Result Value Ref Range   Urine Culture, Comprehensive Preliminary report    Organism ID, Bacteria Comment     Comment: Microbiological testing to rule out the presence of possible pathogens is in progress. Greater than 100,000 colony forming units per mL   Urinalysis, Complete     Status: Abnormal   Collection Time: 12/24/20 11:04 AM  Result Value Ref Range   Specific Gravity, UA 1.010 1.005 - 1.030   pH, UA 5.0 5.0 - 7.5   Color, UA Straw Yellow   Appearance Ur Hazy (A) Clear   Leukocytes,UA Trace (A) Negative   Protein,UA 1+ (A) Negative/Trace   Glucose, UA 3+ (A) Negative   Ketones, UA Negative Negative   RBC, UA 2+ (A) Negative   Bilirubin, UA Negative Negative   Urobilinogen, Ur 0.2 0.2 - 1.0 mg/dL   Nitrite, UA Negative Negative   Microscopic Examination See below:   Microscopic Examination     Status: Abnormal   Collection Time: 12/24/20  11:04 AM   Urine  Result Value Ref Range   WBC, UA 6-10 (A) 0 - 5 /hpf   RBC 3-10 (A) 0 - 2 /hpf   Epithelial Cells (non renal) 0-10 0 - 10 /hpf   Crystals Present (A) N/A   Crystal Type Amorphous Sediment N/A   Bacteria, UA Few None seen/Few  Comprehensive metabolic panel     Status: Abnormal   Collection Time: 12/27/20  1:52 PM  Result Value Ref Range   Sodium 137 135 - 145 mmol/L   Potassium 4.1 3.5 - 5.1 mmol/L   Chloride 103 98 - 111 mmol/L   CO2 24 22 - 32 mmol/L   Glucose, Bld 325 (H) 70 - 99 mg/dL    Comment: Glucose reference range applies only to samples taken after fasting for at least 8 hours.   BUN 13 8 - 23 mg/dL   Creatinine, Ser 1.09 0.61 - 1.24 mg/dL   Calcium 9.5 8.9 - 10.3 mg/dL   Total Protein 6.5 6.5 - 8.1 g/dL   Albumin 3.3 (L) 3.5 - 5.0 g/dL   AST 21 15 - 41 U/L   ALT 41 0 - 44 U/L   Alkaline Phosphatase 65 38 - 126 U/L   Total Bilirubin 0.5 0.3 - 1.2 mg/dL   GFR, Estimated >60 >60 mL/min    Comment: (NOTE) Calculated using the CKD-EPI Creatinine Equation (2021)     Anion gap 10 5 - 15    Comment: Performed at [facility], [address] [place], [place] [address]  CBC with Differential     Status: Abnormal   Collection Time: 12/27/20  1:52 PM  Result Value Ref Range   WBC 6.8 4.0 - 10.5 K/uL   RBC 4.65 4.22 - 5.81 MIL/uL   Hemoglobin 14.1 13.0 - 17.0 g/dL   HCT 41.5 39.0 - 52.0 %   MCV 89.2 80.0 - 100.0 fL   MCH 30.3 26.0 - 34.0 pg   MCHC 34.0 30.0 - 36.0 g/dL   RDW 15.4 11.5 - 15.5 %   Platelets 142 (L) 150 - 400 K/uL   nRBC 0.0 0.0 - 0.2 %   Neutrophils Relative % 59 %   Neutro Abs 4.0 1.7 - 7.7 K/uL   Lymphocytes Relative 29 %   Lymphs Abs 1.9 0.7 - 4.0 K/uL   Monocytes Relative 10 %   Monocytes Absolute 0.7 0.1 - 1.0 K/uL   Eosinophils Relative 1 %   Eosinophils Absolute 0.1 0.0 - 0.5 K/uL   Basophils Relative 0 %   Basophils Absolute 0.0 0.0 - 0.1 K/uL   Immature Granulocytes 1 %   Abs Immature Granulocytes 0.09 (H) 0.00 - 0.07 K/uL    Comment: Performed at [facility], [address] [place], [place] [address]    Diabetic Foot Exam: Diabetic Foot Exam - Simple   No data filed    ***  PHQ2/9: Depression screen [facility] 2/9 10/31/2020 07/26/2020 04/24/2020 04/18/2020 09/06/2019  Decreased Interest 0 0 0 0 0  Down, Depressed, Hopeless 0 0 0 0 0  PHQ - 2 Score 0 0 0 0 0  Altered sleeping - - 0 - 0  Tired, decreased energy - - 0 - 0  Change in appetite - - 0 - 0  Feeling bad or failure about yourself  - - 0 - 0  Trouble concentrating - - 0 - 0  Moving slowly or fidgety/restless - - 0 - 0  Suicidal thoughts - - 0 -  0  PHQ-9 Score - - 0 - 0  Difficult doing work/chores - - Not difficult at all - Not difficult at all  Some recent data might be hidden    phq 9 is {gen pos [id number]} ***  Fall Risk: Fall Risk  10/31/2020 07/26/2020 04/24/2020 04/18/2020 10/18/2019  Falls in the past year? 0 0 0 0 0  Number falls in past yr: 0 0 0 0 -  Injury with Fall? 0 0 0 0 -  Risk for fall due to : - - - No Fall Risks -   Follow up - - - Falls prevention discussed -   ***   Functional Status Survey:   ***   Assessment & Plan  *** There are no diagnoses linked to this encounter.

## 2020-12-27 NOTE — Telephone Encounter (Signed)
Pt currently has foley in place. Called pt's daughter informed her of the information below. RX sent in. Daughter voiced understanding.

## 2020-12-28 ENCOUNTER — Emergency Department: Payer: Medicare Other

## 2020-12-28 ENCOUNTER — Encounter: Payer: Self-pay | Admitting: Emergency Medicine

## 2020-12-28 ENCOUNTER — Other Ambulatory Visit: Payer: Self-pay

## 2020-12-28 ENCOUNTER — Inpatient Hospital Stay
Admission: EM | Admit: 2020-12-28 | Discharge: 2021-01-01 | DRG: 689 | Disposition: A | Discharge status: Home Health | Payer: Medicare Other | Attending: Internal Medicine | Admitting: Internal Medicine

## 2020-12-28 DIAGNOSIS — T83511A Infection and inflammatory reaction due to indwelling urethral catheter, initial encounter: Secondary | ICD-10-CM | POA: Diagnosis not present

## 2020-12-28 DIAGNOSIS — N390 Urinary tract infection, site not specified: Secondary | ICD-10-CM | POA: Diagnosis present

## 2020-12-28 DIAGNOSIS — R339 Retention of urine, unspecified: Secondary | ICD-10-CM | POA: Diagnosis not present

## 2020-12-28 DIAGNOSIS — Z79899 Other long term (current) drug therapy: Secondary | ICD-10-CM

## 2020-12-28 DIAGNOSIS — Z86010 Personal history of colonic polyps: Secondary | ICD-10-CM | POA: Diagnosis not present

## 2020-12-28 DIAGNOSIS — Z86711 Personal history of pulmonary embolism: Secondary | ICD-10-CM

## 2020-12-28 DIAGNOSIS — Z20822 Contact with and (suspected) exposure to covid-19: Secondary | ICD-10-CM | POA: Diagnosis present

## 2020-12-28 DIAGNOSIS — Z833 Family history of diabetes mellitus: Secondary | ICD-10-CM | POA: Diagnosis not present

## 2020-12-28 DIAGNOSIS — G9389 Other specified disorders of brain: Secondary | ICD-10-CM | POA: Diagnosis not present

## 2020-12-28 DIAGNOSIS — C3412 Malignant neoplasm of upper lobe, left bronchus or lung: Secondary | ICD-10-CM | POA: Diagnosis present

## 2020-12-28 DIAGNOSIS — N1831 Chronic kidney disease, stage 3a: Secondary | ICD-10-CM | POA: Diagnosis not present

## 2020-12-28 DIAGNOSIS — I82443 Acute embolism and thrombosis of tibial vein, bilateral: Secondary | ICD-10-CM | POA: Diagnosis present

## 2020-12-28 DIAGNOSIS — E785 Hyperlipidemia, unspecified: Secondary | ICD-10-CM | POA: Diagnosis not present

## 2020-12-28 DIAGNOSIS — Z923 Personal history of irradiation: Secondary | ICD-10-CM | POA: Diagnosis not present

## 2020-12-28 DIAGNOSIS — R000 Tachycardia, unspecified: Secondary | ICD-10-CM | POA: Diagnosis not present

## 2020-12-28 DIAGNOSIS — Z8249 Family history of ischemic heart disease and other diseases of the circulatory system: Secondary | ICD-10-CM

## 2020-12-28 DIAGNOSIS — Z8673 Personal history of transient ischemic attack (TIA), and cerebral infarction without residual deficits: Secondary | ICD-10-CM | POA: Diagnosis not present

## 2020-12-28 DIAGNOSIS — N183 Chronic kidney disease, stage 3 unspecified: Secondary | ICD-10-CM | POA: Diagnosis present

## 2020-12-28 DIAGNOSIS — B952 Enterococcus as the cause of diseases classified elsewhere: Secondary | ICD-10-CM | POA: Diagnosis present

## 2020-12-28 DIAGNOSIS — Z85118 Personal history of other malignant neoplasm of bronchus and lung: Secondary | ICD-10-CM | POA: Diagnosis not present

## 2020-12-28 DIAGNOSIS — E1122 Type 2 diabetes mellitus with diabetic chronic kidney disease: Secondary | ICD-10-CM | POA: Diagnosis present

## 2020-12-28 DIAGNOSIS — E1165 Type 2 diabetes mellitus with hyperglycemia: Secondary | ICD-10-CM | POA: Diagnosis present

## 2020-12-28 DIAGNOSIS — N401 Enlarged prostate with lower urinary tract symptoms: Secondary | ICD-10-CM | POA: Diagnosis present

## 2020-12-28 DIAGNOSIS — R0602 Shortness of breath: Secondary | ICD-10-CM | POA: Diagnosis not present

## 2020-12-28 DIAGNOSIS — G9341 Metabolic encephalopathy: Secondary | ICD-10-CM | POA: Diagnosis present

## 2020-12-28 DIAGNOSIS — N189 Chronic kidney disease, unspecified: Secondary | ICD-10-CM | POA: Diagnosis present

## 2020-12-28 DIAGNOSIS — E7800 Pure hypercholesterolemia, unspecified: Secondary | ICD-10-CM | POA: Diagnosis present

## 2020-12-28 DIAGNOSIS — R338 Other retention of urine: Secondary | ICD-10-CM | POA: Diagnosis present

## 2020-12-28 DIAGNOSIS — M7989 Other specified soft tissue disorders: Secondary | ICD-10-CM | POA: Diagnosis not present

## 2020-12-28 DIAGNOSIS — I82403 Acute embolism and thrombosis of unspecified deep veins of lower extremity, bilateral: Secondary | ICD-10-CM | POA: Diagnosis not present

## 2020-12-28 DIAGNOSIS — I82442 Acute embolism and thrombosis of left tibial vein: Secondary | ICD-10-CM | POA: Diagnosis not present

## 2020-12-28 DIAGNOSIS — C7931 Secondary malignant neoplasm of brain: Secondary | ICD-10-CM | POA: Diagnosis present

## 2020-12-28 DIAGNOSIS — C3490 Malignant neoplasm of unspecified part of unspecified bronchus or lung: Secondary | ICD-10-CM

## 2020-12-28 DIAGNOSIS — Z794 Long term (current) use of insulin: Secondary | ICD-10-CM | POA: Diagnosis not present

## 2020-12-28 DIAGNOSIS — C3432 Malignant neoplasm of lower lobe, left bronchus or lung: Secondary | ICD-10-CM | POA: Diagnosis not present

## 2020-12-28 DIAGNOSIS — I824Y9 Acute embolism and thrombosis of unspecified deep veins of unspecified proximal lower extremity: Secondary | ICD-10-CM | POA: Diagnosis not present

## 2020-12-28 DIAGNOSIS — I82441 Acute embolism and thrombosis of right tibial vein: Secondary | ICD-10-CM | POA: Diagnosis not present

## 2020-12-28 DIAGNOSIS — N179 Acute kidney failure, unspecified: Secondary | ICD-10-CM | POA: Diagnosis present

## 2020-12-28 DIAGNOSIS — Z86718 Personal history of other venous thrombosis and embolism: Secondary | ICD-10-CM

## 2020-12-28 DIAGNOSIS — I129 Hypertensive chronic kidney disease with stage 1 through stage 4 chronic kidney disease, or unspecified chronic kidney disease: Secondary | ICD-10-CM | POA: Diagnosis present

## 2020-12-28 DIAGNOSIS — G319 Degenerative disease of nervous system, unspecified: Secondary | ICD-10-CM | POA: Diagnosis not present

## 2020-12-28 LAB — CBC WITH DIFFERENTIAL/PLATELET
Abs Immature Granulocytes: 0.07 10*3/uL (ref 0.00–0.07)
Basophils Absolute: 0 10*3/uL (ref 0.0–0.1)
Basophils Relative: 0 %
Eosinophils Absolute: 0 10*3/uL (ref 0.0–0.5)
Eosinophils Relative: 1 %
HCT: 42 % (ref 39.0–52.0)
Hemoglobin: 14.1 g/dL (ref 13.0–17.0)
Immature Granulocytes: 1 %
Lymphocytes Relative: 28 %
Lymphs Abs: 2.2 10*3/uL (ref 0.7–4.0)
MCH: 29.8 pg (ref 26.0–34.0)
MCHC: 33.6 g/dL (ref 30.0–36.0)
MCV: 88.8 fL (ref 80.0–100.0)
Monocytes Absolute: 0.8 10*3/uL (ref 0.1–1.0)
Monocytes Relative: 10 %
Neutro Abs: 4.7 10*3/uL (ref 1.7–7.7)
Neutrophils Relative %: 60 %
Platelets: 166 10*3/uL (ref 150–400)
RBC: 4.73 MIL/uL (ref 4.22–5.81)
RDW: 15.4 % (ref 11.5–15.5)
WBC: 7.9 10*3/uL (ref 4.0–10.5)
nRBC: 0 % (ref 0.0–0.2)

## 2020-12-28 LAB — RESP PANEL BY RT-PCR (FLU A&B, COVID) ARPGX2
Influenza A by PCR: NEGATIVE
Influenza B by PCR: NEGATIVE
SARS Coronavirus 2 by RT PCR: NEGATIVE

## 2020-12-28 LAB — URINALYSIS, COMPLETE (UACMP) WITH MICROSCOPIC
Bilirubin Urine: NEGATIVE
Glucose, UA: >=500 mg/dL — AB
Ketones, ur: NEGATIVE mg/dL
Nitrite: NEGATIVE
Protein, ur: 100 mg/dL — AB
RBC / HPF: >50 RBC/hpf — ABNORMAL HIGH (ref 0–5)
Specific Gravity, Urine: 1.013 (ref 1.005–1.030)
Squamous Epithelial / HPF: NONE SEEN (ref 0–5)
WBC, UA: >50 WBC/hpf — ABNORMAL HIGH (ref 0–5)
pH: 6 (ref 5.0–8.0)

## 2020-12-28 LAB — LACTIC ACID, PLASMA
Lactic Acid, Venous: 2.7 mmol/L (ref 0.5–1.9)
Lactic Acid, Venous: 2 mmol/L (ref 0.5–1.9)

## 2020-12-28 LAB — COMPREHENSIVE METABOLIC PANEL
ALT: 38 U/L (ref 0–44)
AST: 19 U/L (ref 15–41)
Albumin: 3.7 g/dL (ref 3.5–5.0)
Alkaline Phosphatase: 67 U/L (ref 38–126)
Anion gap: 11 (ref 5–15)
BUN: 17 mg/dL (ref 8–23)
CO2: 20 mmol/L — ABNORMAL LOW (ref 22–32)
Calcium: 10.1 mg/dL (ref 8.9–10.3)
Chloride: 101 mmol/L (ref 98–111)
Creatinine, Ser: 1.52 mg/dL — ABNORMAL HIGH (ref 0.61–1.24)
GFR, Estimated: 50 mL/min — ABNORMAL LOW (ref >60)
Glucose, Bld: 368 mg/dL — ABNORMAL HIGH (ref 70–99)
Potassium: 4.1 mmol/L (ref 3.5–5.1)
Sodium: 132 mmol/L — ABNORMAL LOW (ref 135–145)
Total Bilirubin: 0.7 mg/dL (ref 0.3–1.2)
Total Protein: 7.2 g/dL (ref 6.5–8.1)

## 2020-12-28 LAB — TROPONIN I (HIGH SENSITIVITY)
Troponin I (High Sensitivity): 11 ng/L (ref <18)
Troponin I (High Sensitivity): 16 ng/L (ref <18)

## 2020-12-28 LAB — BRAIN NATRIURETIC PEPTIDE: B Natriuretic Peptide: 38.3 pg/mL (ref 0.0–100.0)

## 2020-12-28 LAB — CBG MONITORING, ED: Glucose-Capillary: 362 mg/dL — ABNORMAL HIGH (ref 70–99)

## 2020-12-28 MED ORDER — SODIUM CHLORIDE 0.9 % IV BOLUS
500.0000 mL | Freq: Once | INTRAVENOUS | Status: AC
  Administered 2020-12-28: 500 mL via INTRAVENOUS

## 2020-12-28 MED ORDER — SODIUM CHLORIDE 0.9 % IV SOLN
1.0000 g | INTRAVENOUS | Status: DC
  Administered 2020-12-29: 17:00:00 1 g via INTRAVENOUS
  Filled 2020-12-28: qty 1
  Filled 2020-12-28: qty 10

## 2020-12-28 MED ORDER — SODIUM CHLORIDE 0.9 % IV SOLN
1.0000 g | Freq: Once | INTRAVENOUS | Status: AC
  Administered 2020-12-28: 1 g via INTRAVENOUS
  Filled 2020-12-28: qty 10

## 2020-12-28 NOTE — Consult Note (Signed)
Hematology/Oncology Consult note [facility] Telephone:([phone] [phone]  Patient Care Team: [name], [name], [name], [name], [facility] (Pharmacist)   Name of the patient: Melvin Willis  [id number]  01-Dec-1950    Reason for consult: History of metastatic lung cancer admitted for bilateral lower extremity DVT   Requesting physician: Dr. Marjean Donna  Date of visit: 12/28/20   History of presenting illness- Patient is a 70 year old male with history of metastatic lung cancer and sees Dr. Rogue Bussing as an outpatient.  He has ROS1 mutation and has progressed on multiple lines of treatment.  Most recent treatment has been a Avastin and lorlatinib.  He has a prior history of bilateral lower extremity DVTs as well as PEs and he was on Xarelto for the same.  However it was stopped in March 2022 when he was found to have progressive brain metastases with hemorrhagic transformation.  He presents to the ER with bilateral lower extremity swelling.  Bilateral lower extremity Doppler showed evidence of DVT in bilateral posterior tibial veins.  Hematology consulted for further management  ECOG PS- 2  Pain scale- 0   Review of systems- Review of Systems  Constitutional: Positive for malaise/fatigue.  Cardiovascular: Positive for leg swelling.    No Known Allergies  Patient Active Problem List   Diagnosis Date Noted  . UTI (urinary tract infection) 12/28/2020  . Delirium   . Palliative care encounter   . Pneumonia due to infectious organism   . Sepsis ([place]) 11/14/2020  . Septic shock ([place]) 11/14/2020  . Acute metabolic encephalopathy 41/74/0814  . Acute respiratory failure ([place]) 11/14/2020  . Transaminitis 11/14/2020  . Supratherapeutic INR 11/14/2020  . Acute-on-chronic kidney injury ([place])  11/14/2020  . Brain metastasis ([place]) 09/25/2019  . Atherosclerosis of aorta ([place]) 09/04/2019  . Goals of care, counseling/discussion 08/02/2019  . Primary malignant neoplasm of lung metastatic to other site ([place]) 04/07/2019  . Chronic deep vein thrombosis (DVT) of left lower extremity ([place]) 01/05/2018  . Chronic kidney disease (CKD), stage III (moderate) ([place]) 11/27/2016  . Old cerebrovascular accident (CVA) without late effect   . Blurred vision, bilateral 06/23/2016  . Primary cancer of left upper lobe of lung ([place]) 06/12/2016  . Mediastinal mass   . History of nephrolithiasis 03/27/2016  . Pharyngeal dysphagia 01/01/2016  . Benign neoplasm of ascending colon   . Benign neoplasm of sigmoid colon   . Benign neoplasm of transverse colon   . Diverticulosis of large intestine without diverticulitis   . Overweight (BMI 25.0-29.9) 04/16/2015  . Type 2 diabetes mellitus with stage 3 chronic kidney disease ([place]) 03/18/2015  . Dyslipidemia 03/18/2015  . Disorder of male genital organ 08/08/2012  . 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 Elevated PSA 08/08/2012     Past Medical History:  Diagnosis Date  . Abnormal prostate specific antigen 08/08/2012  . Adiposity 04/16/2015  . Chronic kidney disease (CKD), stage III (moderate) ([place]) 11/27/2016  . CVA (cerebral vascular accident) ([place]) 06/17/2016  . Diabetes mellitus without complication ([place])   . Diverticulosis of sigmoid colon 04/16/2015  . Dyslipidemia 03/18/2015  . Hemorrhoids, internal 04/16/2015  . Hypercholesteremia 04/16/2015  . Hyperlipidemia   . Hypertension   . Primary cancer of left upper lobe of lung ([place])   . Pulmonary embolism ([place])   . Wears dentures    partial upper  Past Surgical History:  Procedure Laterality Date  . COLONOSCOPY    . COLONOSCOPY WITH PROPOFOL N/A 05/06/2015   Procedure: COLONOSCOPY WITH PROPOFOL;  Surgeon: Lucilla Lame, MD;  Location: [place];  Service: Endoscopy;   Laterality: N/A;  ASCENDING COLON POLYPS X 2 TERMINAL ILEUM BIOPSY RANDOM COLON BX. TRANSVERSE COLON POLYP SIGMOID COLON POLYP  . ESOPHAGOGASTRODUODENOSCOPY (EGD) WITH PROPOFOL N/A 05/06/2015   Procedure: ESOPHAGOGASTRODUODENOSCOPY (EGD) WITH PROPOFOL;  Surgeon: Lucilla Lame, MD;  Location: [place];  Service: Endoscopy;  Laterality: N/A;  GASTRIC BIOPSY X1  . KIDNEY STONE SURGERY  2017    Social History   Socioeconomic History  . Marital status: Married    Spouse name: Melissa   . Number of children: 3  . Years of education: Not on file  . Highest education level: Not on file  Occupational History  . Occupation: disable     Comment: from CVA and lung cancer  Tobacco Use  . Smoking status: Never Smoker  . Smokeless tobacco: Never Used  Vaping Use  . Vaping Use: Never used  Substance and Sexual Activity  . Alcohol use: No    Alcohol/week: 0.0 standard drinks  . Drug use: No  . Sexual activity: Not Currently    Partners: Female    Birth control/protection: None  Other Topics Concern  . Not on file  Social History Narrative   Used to work until 2 years ago when got sick with DVT leg , PE , CVA and found out he had lung cancer. He has medicare now    Social Determinants of Radio broadcast assistant Strain: High Risk  . Difficulty of Paying Living Expenses: Very hard  Food Insecurity: No Food Insecurity  . Worried About Charity fundraiser in the Last Year: Never true  . Ran Out of Food in the Last Year: Never true  Transportation Needs: No Transportation Needs  . Lack of Transportation (Medical): No  . Lack of Transportation (Non-Medical): No  Physical Activity: Inactive  . Days of Exercise per Week: 0 days  . Minutes of Exercise per Session: 0 min  Stress: No Stress Concern Present  . Feeling of Stress : Not at all  Social Connections: Moderately Integrated  . Frequency of Communication with Friends and Family: More than three times a week  . Frequency  of Social Gatherings with Friends and Family: Three times a week  . Attends Religious Services: More than 4 times per year  . Active Member of Clubs or Organizations: No  . Attends Archivist Meetings: Never  . Marital Status: Married  Human resources officer Violence: Not At Risk  . Fear of Current or Ex-Partner: No  . Emotionally Abused: No  . Physically Abused: No  . Sexually Abused: No     Family History  Problem Relation Age of Onset  . Diabetes Mother   . Diabetes Father   . CAD Father   . Dementia Father   . Diabetes Sister   . Cancer Maternal Uncle        Prostate  . Cancer Cousin        prostate     Current Facility-Administered Medications:  .  [START ON 12/29/2020] cefTRIAXone (ROCEPHIN) 1 g in sodium chloride 0.9 % 100 mL IVPB, 1 g, Intravenous, Q24H, Ileene Musa T, DO   Physical exam:  Vitals:   12/28/20 2000 12/28/20 2100 12/28/20 2121 12/28/20 2213  BP: (!) 146/87 128/87 128/87 131/77  Pulse:  93 84  Resp: (!) 21 (!) 25 (!) 21 17  Temp:   98.6 F (37 C) 99.1 F (37.3 C)  TempSrc:   Oral Oral  SpO2:   96% 100%   Physical Exam Constitutional:      Comments: Appears fatigued  Cardiovascular:     Rate and Rhythm: Normal rate and regular rhythm.     Heart sounds: Normal heart sounds.  Pulmonary:     Effort: Pulmonary effort is normal.     Breath sounds: Normal breath sounds.  Abdominal:     General: Bowel sounds are normal.     Palpations: Abdomen is soft.  Musculoskeletal:        General: Swelling (Bilateral lower extremities appear swollen) present.     Right lower leg: Edema present.     Left lower leg: Edema present.  Skin:    General: Skin is warm and dry.  Neurological:     Mental Status: He is alert and oriented to person, place, and time.        CMP Latest Ref Rng & Units 12/28/2020  Glucose 70 - 99 mg/dL 368(H)  BUN 8 - 23 mg/dL 17  Creatinine 0.61 - 1.24 mg/dL 1.52(H)  Sodium 135 - 145 mmol/L 132(L)  Potassium 3.5 - 5.1  mmol/L 4.1  Chloride 98 - 111 mmol/L 101  CO2 22 - 32 mmol/L 20(L)  Calcium 8.9 - 10.3 mg/dL 10.1  Total Protein 6.5 - 8.1 g/dL 7.2  Total Bilirubin 0.3 - 1.2 mg/dL 0.7  Alkaline Phos 38 - 126 U/L 67  AST 15 - 41 U/L 19  ALT 0 - 44 U/L 38   CBC Latest Ref Rng & Units 12/28/2020  WBC 4.0 - 10.5 K/uL 7.9  Hemoglobin 13.0 - 17.0 g/dL 14.1  Hematocrit 39.0 - 52.0 % 42.0  Platelets 150 - 400 K/uL 166    '@IMAGES' @  US Venous Img Lower Bilateral  Result Date: 12/28/2020 CLINICAL DATA:  Leg swelling for 1 week. Patient has a history of lung malignancy and prior DVT. EXAM: BILATERAL LOWER EXTREMITY VENOUS DOPPLER ULTRASOUND TECHNIQUE: Gray-scale sonography with graded compression, as well as color Doppler and duplex ultrasound were performed to evaluate the lower extremity deep venous systems from the level of the common femoral vein and including the common femoral, femoral, profunda femoral, popliteal and calf veins including the posterior tibial, peroneal and gastrocnemius veins when visible. The superficial great saphenous vein was also interrogated. Spectral Doppler was utilized to evaluate flow at rest and with distal augmentation maneuvers in the common femoral, femoral and popliteal veins. COMPARISON:  Lower extremity ultrasound dated 06/03/2016. FINDINGS: RIGHT LOWER EXTREMITY Common Femoral Vein: No evidence of thrombus. Normal compressibility, respiratory phasicity and response to augmentation. Saphenofemoral Junction: No evidence of thrombus. Normal compressibility and flow on color Doppler imaging. Profunda Femoral Vein: No evidence of thrombus. Normal compressibility and flow on color Doppler imaging. Femoral Vein: No evidence of thrombus. Normal compressibility, respiratory phasicity and response to augmentation. Popliteal Vein: No evidence of thrombus. Normal compressibility, respiratory phasicity and response to augmentation. Calf Veins: Thrombus is noted in the posterior tibial vein.  Superficial Great Saphenous Vein: No evidence of thrombus. Normal compressibility. Venous Reflux:  None. Other Findings:  None. LEFT LOWER EXTREMITY Common Femoral Vein: No evidence of thrombus. Normal compressibility, respiratory phasicity and response to augmentation. Saphenofemoral Junction: No evidence of thrombus. Normal compressibility and flow on color Doppler imaging. Profunda Femoral Vein: No evidence of thrombus. Normal compressibility and flow on color  Doppler imaging. Femoral Vein: No evidence of thrombus. Normal compressibility, respiratory phasicity and response to augmentation. Popliteal Vein: No evidence of thrombus. Normal compressibility, respiratory phasicity and response to augmentation. Calf Veins: Thrombus is noted in the posterior tibial vein. Superficial Great Saphenous Vein: No evidence of thrombus. Normal compressibility. Venous Reflux:  None. Other Findings:  None. IMPRESSION: Occlusive thrombus in the bilateral posterior tibial veins. Electronically Signed   By: Zerita Boers M.D.   On: 12/28/2020 18:57   DG Chest Portable 1 View  Result Date: 12/28/2020 CLINICAL DATA:  Shortness of breath. EXAM: PORTABLE CHEST 1 VIEW COMPARISON:  August 08, 2018 FINDINGS: Post treatment changes are seen in the left upper lobe. The heart, hila, mediastinum, lungs, and pleura are otherwise unremarkable. IMPRESSION: No acute interval change. Electronically Signed   By: Dorise Bullion III M.D   On: 12/28/2020 17:51    Assessment and plan- Patient is a 70 y.o. male with history of ROS1 positive metastatic lung cancer and brain metastases with prior history of DVT now admitted for recurrent DVT  Recurrent bilateral lower extremity DVT: Patient has prior history of DVTs and was on Xarelto until March 2022.He was found to have progressive brain metastases with concern for hemorrhagic transformation and at that time his Xarelto was held.  He now presents with new bilateral posterior tibial vein DVT.  I  would recommend proceeding with IVC filter at this time.  We will also obtain a repeat MRI brain with and without contrast to assess the status of his brain lesions and if there is any concern for bleeding within these lesions.  If overall the findings are stable it may be reasonable to rechallenge him with anticoagulation but I will defer that decision to Dr. Rogue Bussing who will see him on Monday.  Metastatic lung cancer: Most recently on lorlatinib. Okay to hold the medication while he is inpatient.  Further goals of care to be discussed as an outpatient  UTI: Patient is also currently being treated with ceftriaxone for possible UTI   Thank you for this kind referral and the opportunity to participate in the care of this  Patient   Visit Diagnosis 1. Acute deep vein thrombosis (DVT) of tibial vein of both lower extremities (HCC)   2. Urinary tract infection associated with indwelling urethral catheter, initial encounter ([place])   3. Urinary retention     Dr. Randa Evens, MD, MPH [facility] at [facility] [phone] 12/28/2020  9:32 AM

## 2020-12-28 NOTE — ED Notes (Signed)
Request made for transport to the floor ?

## 2020-12-28 NOTE — ED Triage Notes (Signed)
Pt to ED via POV with his wife. Pt's wife reports that she emptied foley bag this morning of 300cc's of urine and has not had any output since. Pt also ill-appearing on arrival to ED. Pt c/o generalized abd pain. Pt's wife reports patient is supposed to have prostate procedure on Friday. Pt's wife also reports that patient has had low blood pressures prior to arrival.

## 2020-12-28 NOTE — ED Notes (Signed)
Pt presents to ED with c/o of foley cathter not working and pt also has c/o of weakness that started this morning. Pt and wife states home health nurse came in to check pt and stated "there was a blockage". Pt is tender on palpation to bladder and states he is having lower ABD pain. Pt appeared gray and not able to answer questions when asked, he would ask his wife to anwser, CBG checked and it was 360.   BLE edema noted at 2+ edema, wife denies any HX of CHF.   Pt denies fevers or chills.

## 2020-12-28 NOTE — ED Notes (Signed)
US at bedside

## 2020-12-28 NOTE — Plan of Care (Signed)
Pt admitted to unit. Alert and oriented x 2 person and place. Foley intact. Denies pain. Vitals stable. Will continue to monitor.  Problem: Education: Goal: Knowledge of General Education information will improve Description: Including pain rating scale, medication(s)/side effects and non-pharmacologic comfort measures Outcome: Progressing   Problem: Health Behavior/Discharge Planning: Goal: Ability to manage health-related needs will improve Outcome: Progressing   Problem: Clinical Measurements: Goal: Ability to maintain clinical measurements within normal limits will improve Outcome: Progressing Goal: Will remain free from infection Outcome: Progressing Goal: Diagnostic test results will improve Outcome: Progressing Goal: Respiratory complications will improve Outcome: Progressing Goal: Cardiovascular complication will be avoided Outcome: Progressing   Problem: Activity: Goal: Risk for activity intolerance will decrease Outcome: Progressing   Problem: Nutrition: Goal: Adequate nutrition will be maintained Outcome: Progressing   Problem: Elimination: Goal: Will not experience complications related to bowel motility Outcome: Progressing Goal: Will not experience complications related to urinary retention Outcome: Progressing   Problem: Pain Managment: Goal: General experience of comfort will improve Outcome: Progressing   Problem: Safety: Goal: Ability to remain free from injury will improve Outcome: Progressing   Problem: Skin Integrity: Goal: Risk for impaired skin integrity will decrease Outcome: Progressing

## 2020-12-28 NOTE — ED Provider Notes (Signed)
[facility] Emergency Department Provider Note  ____________________________________________   Event Date/Time   First MD Initiated Contact with Patient 12/28/20 1652     (approximate)  I have reviewed the triage vital signs and the nursing notes.   HISTORY  Chief Complaint Urinary Retention and Weakness    HPI Melvin Willis is a 70 y.o. male with diabetes, CKD, hypertension, hyperlipidemia, chronic Foley who comes in for urinary retention and weakness.  Patient is very drowsy and has difficulty tell me the exact story but he states that he is having difficulty urinating out of his catheter.  He reports he supposed to have a procedure for his prostate next week.  According to wife he has had less urine coming out.  She reports briefly having 300 mils but nothing since then this morning.  He reports feeling more weak all over constant, nothing makes it better, nothing makes it worse.  He does have a little bit of swelling in his legs that wife states has been getting worse over the past week.    I did review patient's records and he was seen yesterday by oncology.  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  Patient is on a medication that is causing elevated blood sugars.  Patient is status post radiation of his brain December 02, 2020.  Patient previously was on Xarelto for PE and lower extremity DVTs but had to be held due to recent hemorrhagic met.    Past Medical History:  Diagnosis Date  . Abnormal prostate specific antigen 08/08/2012  . Adiposity 04/16/2015  . Chronic kidney disease (CKD), stage III (moderate) ([place]) 11/27/2016  . CVA (cerebral vascular accident) ([place]) 06/17/2016  . Diabetes mellitus without complication ([place])   . Diverticulosis of sigmoid colon 04/16/2015  . Dyslipidemia 03/18/2015  . Hemorrhoids, internal 04/16/2015  . Hypercholesteremia 04/16/2015  . Hyperlipidemia   . Hypertension   . Primary cancer of left  upper lobe of lung ([place])   . Pulmonary embolism ([place])   . Wears dentures    partial upper    Patient Active Problem List   Diagnosis Date Noted  . Delirium   . Palliative care encounter   . Pneumonia due to infectious organism   . Sepsis ([place]) 11/14/2020  . Septic shock ([place]) 11/14/2020  . Acute metabolic encephalopathy 14/97/0263  . Acute respiratory failure ([place]) 11/14/2020  . Transaminitis 11/14/2020  . Supratherapeutic INR 11/14/2020  . Acute-on-chronic kidney injury ([place]) 11/14/2020  . Brain metastasis ([place]) 09/25/2019  . Atherosclerosis of aorta ([place]) 09/04/2019  . Goals of care, counseling/discussion 08/02/2019  . Primary malignant neoplasm of lung metastatic to other site ([place]) 04/07/2019  . Chronic deep vein thrombosis (DVT) of left lower extremity ([place]) 01/05/2018  . Chronic kidney disease (CKD), stage III (moderate) ([place]) 11/27/2016  . Old cerebrovascular accident (CVA) without late effect   . Blurred vision, bilateral 06/23/2016  . Primary cancer of left upper lobe of lung ([place]) 06/12/2016  . Mediastinal mass   . History of nephrolithiasis 03/27/2016  . Pharyngeal dysphagia 01/01/2016  . Benign neoplasm of ascending colon   . Benign neoplasm of sigmoid colon   . Benign neoplasm of transverse colon   . Diverticulosis of large intestine without diverticulitis   . Overweight (BMI 25.0-29.9) 04/16/2015  . Type 2 diabetes mellitus with stage 3 chronic kidney disease ([place]) 03/18/2015  . Dyslipidemia 03/18/2015  . Disorder of male genital organ 08/08/2012  . Nodular  prostate with urinary obstruction 08/08/2012  . Elevated PSA 08/08/2012    Past Surgical History:  Procedure Laterality Date  . COLONOSCOPY    . COLONOSCOPY WITH PROPOFOL N/A 05/06/2015   Procedure: COLONOSCOPY WITH PROPOFOL;  Surgeon: Lucilla Lame, MD;  Location: [facility];  Service: Endoscopy;  Laterality: N/A;  ASCENDING COLON POLYPS X 2 TERMINAL ILEUM BIOPSY RANDOM COLON BX. TRANSVERSE  COLON POLYP SIGMOID COLON POLYP  . ESOPHAGOGASTRODUODENOSCOPY (EGD) WITH PROPOFOL N/A 05/06/2015   Procedure: ESOPHAGOGASTRODUODENOSCOPY (EGD) WITH PROPOFOL;  Surgeon: Lucilla Lame, MD;  Location: [facility];  Service: Endoscopy;  Laterality: N/A;  GASTRIC BIOPSY X1  . KIDNEY STONE SURGERY  2017    Prior to Admission medications   Medication Sig Start Date End Date Taking? Authorizing Provider  atorvastatin (LIPITOR) 40 MG tablet Take 1 tablet (40 mg total) by mouth daily. 12/24/20   [name], MD  blood glucose meter kit and supplies Dispense based on patient and insurance preference (Accuchek Aviva Plus, Accuchek Nano). Use up to four times daily as directed. (FOR ICD-10 E10.9, E11.9). 05/08/19   [name], MD  finasteride (PROSCAR) 5 MG tablet Take 1 tablet (5 mg total) by mouth daily. 06/13/20   Billey Co, MD  gabapentin (NEURONTIN) 300 MG capsule Take 1 capsule by mouth at bedtime Patient taking differently: Take 300 mg by mouth at bedtime. 12/02/20   Cammie Sickle, MD  glipiZIDE (GLUCOTROL) 5 MG tablet Take 1 tablet (5 mg total) by mouth 2 (two) times daily before a meal. 11/29/20   Cammie Sickle, MD  Insulin Glargine-Lixisenatide 100-33 UNT-MCG/ML SOPN Inject 15 Units into the skin daily with lunch.    [provider]  Insulin Pen Needle (NOVOFINE PEN NEEDLE) 32G X 6 MM MISC 1 each by Does not apply route daily. 10/31/20   [name], MD  levETIRAcetam (KEPPRA) 500 MG tablet Take 1 tablet (500 mg total) by mouth 2 (two) times daily. 12/23/20 01/22/21  Cammie Sickle, MD  lorlatinib (LORBRENA) 100 MG tablet Take 1 tablet (100 mg total) by mouth daily. Swallow tablets whole. Do not chew, crush or split tablets. 11/29/20   Cammie Sickle, MD  sulfamethoxazole-trimethoprim (BACTRIM DS) 800-160 MG tablet Take 1 tablet by mouth 2 (two) times daily for 10 days. Patient not taking: Reported on 12/27/2020 12/27/20 01/06/21  Billey Co,  MD  tamsulosin (FLOMAX) 0.4 MG CAPS capsule Take 1 capsule by mouth once daily in the morning Patient taking differently: Take 0.4 mg by mouth in the morning. 12/11/19   Stoioff, Ronda Fairly, MD    Allergies Patient has no known allergies.  Family History  Problem Relation Age of Onset  . Diabetes Mother   . Diabetes Father   . CAD Father   . Dementia Father   . Diabetes Sister   . Cancer Maternal Uncle        Prostate  . Cancer Cousin        prostate    Social History Social History   Tobacco Use  . Smoking status: Never Smoker  . Smokeless tobacco: Never Used  Vaping Use  . Vaping Use: Never used  Substance Use Topics  . Alcohol use: No    Alcohol/week: 0.0 standard drinks  . Drug use: No      Review of Systems Constitutional: No fever/chills, positive weakness Eyes: No visual changes. ENT: No sore throat. Cardiovascular: Denies chest pain. Respiratory: Denies shortness of breath. Gastrointestinal: No abdominal pain.  No  nausea, no vomiting.  No diarrhea.  No constipation. Genitourinary: Negative for dysuria.  Positive urinary retention Musculoskeletal: Negative for back pain.  Positive leg swelling Skin: Negative for rash. Neurological: Negative for headaches, focal weakness or numbness. All other ROS negative ____________________________________________   PHYSICAL EXAM:  VITAL SIGNS: ED Triage Vitals  Enc Vitals Group     BP 12/28/20 1656 (!) 121/94     Pulse Rate 12/28/20 1656 100     Resp 12/28/20 1656 12     Temp 12/28/20 1656 98.2 F (36.8 C)     Temp Source 12/28/20 1656 Oral     SpO2 12/28/20 1656 95 %     Weight --      Height --      Head Circumference --      Peak Flow --      Pain Score 12/28/20 1647 10     Pain Loc --      Pain Edu? --      Excl. in [place]? --     Constitutional: Patient is drowsy but will awaken and answer questions. Eyes: Conjunctivae are normal. EOMI. Head: Atraumatic. Nose: No congestion/rhinnorhea. Mouth/Throat:  Mucous membranes are moist.   Neck: No stridor. Trachea Midline. FROM Cardiovascular: Normal rate, regular rhythm. Grossly normal heart sounds.  Good peripheral circulation. Respiratory: Normal respiratory effort.  No retractions. Lungs CTAB. Gastrointestinal: Soft and nontender. No distention. No abdominal bruits.  Musculoskeletal: Bilateral 1+ edema no joint effusions. Neurologic:  Normal speech and language. No gross focal neurologic deficits are appreciated.  Skin:  Skin is warm, dry and intact. No rash noted. Psychiatric: Mood and affect are normal. Speech and behavior are normal. GU: Deferred   ____________________________________________   LABS (all labs ordered are listed, but only abnormal results are displayed)  Labs Reviewed  COMPREHENSIVE METABOLIC PANEL - Abnormal; Notable for the following components:      Result Value   Sodium 132 (*)    CO2 20 (*)    Glucose, Bld 368 (*)    Creatinine, Ser 1.52 (*)    GFR, Estimated 50 (*)    All other components within normal limits  URINALYSIS, COMPLETE (UACMP) WITH MICROSCOPIC - Abnormal; Notable for the following components:   Color, Urine AMBER (*)    APPearance CLOUDY (*)    Glucose, UA >=500 (*)    Hgb urine dipstick MODERATE (*)    Protein, ur 100 (*)    Leukocytes,Ua SMALL (*)    RBC / HPF >50 (*)    WBC, UA >50 (*)    Bacteria, UA RARE (*)    All other components within normal limits  LACTIC ACID, PLASMA - Abnormal; Notable for the following components:   Lactic Acid, Venous 2.7 (*)    All other components within normal limits  CBG MONITORING, ED - Abnormal; Notable for the following components:   Glucose-Capillary 362 (*)    All other components within normal limits  RESP PANEL BY RT-PCR (FLU A&B, COVID) ARPGX2  URINE CULTURE  CBC WITH DIFFERENTIAL/PLATELET  BRAIN NATRIURETIC PEPTIDE  LACTIC ACID, PLASMA  BASIC METABOLIC PANEL  CBC  TROPONIN I (HIGH SENSITIVITY)  TROPONIN I (HIGH SENSITIVITY)    ____________________________________________   ED ECG REPORT I, Vanessa [place], the attending physician, personally viewed and interpreted this ECG.  Sinus tachycardia rate of 100, no ST elevation, no T wave versions, normal intervals ____________________________________________  RADIOLOGY Robert Bellow, personally viewed and evaluated these images (plain radiographs) as part of  my medical decision making, as well as reviewing the written report by the radiologist.  ED MD interpretation: No pleural effusions  Official radiology report(s): US Venous Img Lower Bilateral  Result Date: 12/28/2020 CLINICAL DATA:  Leg swelling for 1 week. Patient has a history of lung malignancy and prior DVT. EXAM: BILATERAL LOWER EXTREMITY VENOUS DOPPLER ULTRASOUND TECHNIQUE: Gray-scale sonography with graded compression, as well as color Doppler and duplex ultrasound were performed to evaluate the lower extremity deep venous systems from the level of the common femoral vein and including the common femoral, femoral, profunda femoral, popliteal and calf veins including the posterior tibial, peroneal and gastrocnemius veins when visible. The superficial great saphenous vein was also interrogated. Spectral Doppler was utilized to evaluate flow at rest and with distal augmentation maneuvers in the common femoral, femoral and popliteal veins. COMPARISON:  Lower extremity ultrasound dated 06/03/2016. FINDINGS: RIGHT LOWER EXTREMITY Common Femoral Vein: No evidence of thrombus. Normal compressibility, respiratory phasicity and response to augmentation. Saphenofemoral Junction: No evidence of thrombus. Normal compressibility and flow on color Doppler imaging. Profunda Femoral Vein: No evidence of thrombus. Normal compressibility and flow on color Doppler imaging. Femoral Vein: No evidence of thrombus. Normal compressibility, respiratory phasicity and response to augmentation. Popliteal Vein: No evidence of thrombus. Normal  compressibility, respiratory phasicity and response to augmentation. Calf Veins: Thrombus is noted in the posterior tibial vein. Superficial Great Saphenous Vein: No evidence of thrombus. Normal compressibility. Venous Reflux:  None. Other Findings:  None. LEFT LOWER EXTREMITY Common Femoral Vein: No evidence of thrombus. Normal compressibility, respiratory phasicity and response to augmentation. Saphenofemoral Junction: No evidence of thrombus. Normal compressibility and flow on color Doppler imaging. Profunda Femoral Vein: No evidence of thrombus. Normal compressibility and flow on color Doppler imaging. Femoral Vein: No evidence of thrombus. Normal compressibility, respiratory phasicity and response to augmentation. Popliteal Vein: No evidence of thrombus. Normal compressibility, respiratory phasicity and response to augmentation. Calf Veins: Thrombus is noted in the posterior tibial vein. Superficial Great Saphenous Vein: No evidence of thrombus. Normal compressibility. Venous Reflux:  None. Other Findings:  None. IMPRESSION: Occlusive thrombus in the bilateral posterior tibial veins. Electronically Signed   By: Zerita Boers M.D.   On: 12/28/2020 18:57   DG Chest Portable 1 View  Result Date: 12/28/2020 CLINICAL DATA:  Shortness of breath. EXAM: PORTABLE CHEST 1 VIEW COMPARISON:  August 08, 2018 FINDINGS: Post treatment changes are seen in the left upper lobe. The heart, hila, mediastinum, lungs, and pleura are otherwise unremarkable. IMPRESSION: No acute interval change. Electronically Signed   By: Dorise Bullion III M.D   On: 12/28/2020 17:51    ____________________________________________   PROCEDURES  Procedure(s) performed (including Critical Care):  .1-3 Lead EKG Interpretation Performed by: Vanessa [place], MD Authorized by: Vanessa [place], MD     Interpretation: normal     ECG rate:  70s   ECG rate assessment: tachycardic     Rhythm: sinus rhythm     Ectopy: none     Conduction:  normal   Comments:     Initially sinus tachycardia but came down to normal sinus     ____________________________________________   INITIAL IMPRESSION / ASSESSMENT AND PLAN / ED COURSE  Vergia Alcon [place] was evaluated in Emergency Department on 12/28/2020 for the symptoms described in the history of present illness. He was evaluated in the context of the global COVID-19 pandemic, which necessitated consideration that the patient might be at risk for infection with the SARS-CoV-2 virus that  causes COVID-19. Institutional protocols and algorithms that pertain to the evaluation of patients at risk for COVID-19 are in a state of rapid change based on information released by regulatory bodies including the CDC and federal and state organizations. These policies and algorithms were followed during the patient's care in the ED.    Patient 70 year old who comes in with concern for his Foley not blowing.  Repeat Foley was placed and patient had good urine output after new Foley was put in.  Labs are ordered to evaluate for Electra abnormalities, AKI, UTI.  Urine consistent with UTI.  Patient started on ceftriaxone.  Patient has a history of sepsis but at this time is very well-appearing with an afebrile so low suspicion for sepsis.  For patient's bilateral leg swelling I did get a chest x-ray and BNP that were negative for CHF.  DVT ultrasounds however were positive.  This makes his situation complicated given patient is had hemorrhagic conversion of his meds previously.  Discussed with wife and she would be open to getting IVC filter if felt like indicated.  I did discuss with Dr. Lang Snow who stated that he would be able to put IVC filter if hematology would like that.  I did let Dr. Janese Banks know from oncology and they would discuss with his oncologist on Monday about anticoagulation but also agree with IVC filter. Discussed hospital team for admission        ____________________________________________   FINAL CLINICAL IMPRESSION(S) / ED DIAGNOSES   Final diagnoses:  Acute deep vein thrombosis (DVT) of tibial vein of both lower extremities (HCC)  Urinary tract infection associated with indwelling urethral catheter, initial encounter ([place])  Urinary retention      MEDICATIONS GIVEN DURING THIS VISIT:  Medications  cefTRIAXone (ROCEPHIN) 1 g in sodium chloride 0.9 % 100 mL IVPB (has no administration in time range)  sodium chloride 0.9 % bolus 500 mL (500 mLs Intravenous Bolus 12/28/20 1745)  cefTRIAXone (ROCEPHIN) 1 g in sodium chloride 0.9 % 100 mL IVPB (1 g Intravenous New Bag/Given 12/28/20 1852)     ED Discharge Orders    None       Note:  This document was prepared using Dragon voice recognition software and may include unintentional dictation errors.   Vanessa Lazy Y U, MD 12/28/20 2116

## 2020-12-28 NOTE — ED Notes (Signed)
Old indwelling cathter removed and a blood clot noted at end of the cathter.   New 16 fr. catheter placed and pt immediately stated relief. One blood clot noted but indwelling is flowing with no issues at this time.

## 2020-12-29 ENCOUNTER — Encounter: Payer: Self-pay | Admitting: Family Medicine

## 2020-12-29 ENCOUNTER — Inpatient Hospital Stay: Payer: Medicare Other

## 2020-12-29 ENCOUNTER — Encounter
Admission: EM | Disposition: A | Discharge status: Home Health | Payer: Self-pay | Source: Home / Self Care | Attending: Internal Medicine

## 2020-12-29 DIAGNOSIS — R339 Retention of urine, unspecified: Secondary | ICD-10-CM

## 2020-12-29 DIAGNOSIS — R338 Other retention of urine: Secondary | ICD-10-CM

## 2020-12-29 DIAGNOSIS — I82403 Acute embolism and thrombosis of unspecified deep veins of lower extremity, bilateral: Secondary | ICD-10-CM

## 2020-12-29 HISTORY — PX: IVC FILTER INSERTION: CATH118245

## 2020-12-29 LAB — GLUCOSE, CAPILLARY
Glucose-Capillary: 159 mg/dL — ABNORMAL HIGH (ref 70–99)
Glucose-Capillary: 68 mg/dL — ABNORMAL LOW (ref 70–99)
Glucose-Capillary: 94 mg/dL (ref 70–99)
Glucose-Capillary: 162 mg/dL — ABNORMAL HIGH (ref 70–99)
Glucose-Capillary: 200 mg/dL — ABNORMAL HIGH (ref 70–99)

## 2020-12-29 LAB — HEMOGLOBIN A1C
Hgb A1c MFr Bld: 9.5 % — ABNORMAL HIGH (ref 4.8–5.6)
Mean Plasma Glucose: 225.95 mg/dL

## 2020-12-29 LAB — CBC
HCT: 36.7 % — ABNORMAL LOW (ref 39.0–52.0)
Hemoglobin: 12.7 g/dL — ABNORMAL LOW (ref 13.0–17.0)
MCH: 30.2 pg (ref 26.0–34.0)
MCHC: 34.6 g/dL (ref 30.0–36.0)
MCV: 87.2 fL (ref 80.0–100.0)
Platelets: 143 10*3/uL — ABNORMAL LOW (ref 150–400)
RBC: 4.21 MIL/uL — ABNORMAL LOW (ref 4.22–5.81)
RDW: 15.6 % — ABNORMAL HIGH (ref 11.5–15.5)
WBC: 6.1 10*3/uL (ref 4.0–10.5)
nRBC: 0 % (ref 0.0–0.2)

## 2020-12-29 LAB — BASIC METABOLIC PANEL
Anion gap: 6 (ref 5–15)
BUN: 12 mg/dL (ref 8–23)
CO2: 24 mmol/L (ref 22–32)
Calcium: 9.3 mg/dL (ref 8.9–10.3)
Chloride: 109 mmol/L (ref 98–111)
Creatinine, Ser: 1.16 mg/dL (ref 0.61–1.24)
GFR, Estimated: >60 mL/min (ref >60)
Glucose, Bld: 101 mg/dL — ABNORMAL HIGH (ref 70–99)
Potassium: 3.5 mmol/L (ref 3.5–5.1)
Sodium: 139 mmol/L (ref 135–145)

## 2020-12-29 SURGERY — IVC FILTER INSERTION
Anesthesia: Moderate Sedation

## 2020-12-29 MED ORDER — ATORVASTATIN CALCIUM 20 MG PO TABS
40.0000 mg | ORAL_TABLET | Freq: Every day | ORAL | Status: DC
  Administered 2020-12-29 – 2021-01-01 (×4): 40 mg via ORAL
  Filled 2020-12-29 (×4): qty 2

## 2020-12-29 MED ORDER — LEVETIRACETAM IN NACL 500 MG/100ML IV SOLN
500.0000 mg | Freq: Two times a day (BID) | INTRAVENOUS | Status: DC
  Administered 2020-12-29 – 2020-12-31 (×5): 500 mg via INTRAVENOUS
  Filled 2020-12-29 (×7): qty 100

## 2020-12-29 MED ORDER — FINASTERIDE 5 MG PO TABS
5.0000 mg | ORAL_TABLET | Freq: Every day | ORAL | Status: DC
  Administered 2020-12-29 – 2021-01-01 (×4): 5 mg via ORAL
  Filled 2020-12-29 (×4): qty 1

## 2020-12-29 MED ORDER — LEVETIRACETAM 500 MG PO TABS
500.0000 mg | ORAL_TABLET | Freq: Two times a day (BID) | ORAL | Status: DC
  Administered 2020-12-29 (×2): 500 mg via ORAL
  Filled 2020-12-29 (×3): qty 1

## 2020-12-29 MED ORDER — CHLORHEXIDINE GLUCONATE CLOTH 2 % EX PADS
6.0000 | MEDICATED_PAD | Freq: Every day | CUTANEOUS | Status: DC
  Administered 2020-12-29 – 2021-01-01 (×4): 6 via TOPICAL

## 2020-12-29 MED ORDER — GADOBUTROL 1 MMOL/ML IV SOLN
7.0000 mL | Freq: Once | INTRAVENOUS | Status: AC | PRN
  Administered 2020-12-29: 17:00:00 7 mL via INTRAVENOUS

## 2020-12-29 MED ORDER — INSULIN ASPART 100 UNIT/ML ~~LOC~~ SOLN: 15.0000 [IU] | Freq: Once | SUBCUTANEOUS | Status: DC

## 2020-12-29 MED ORDER — LORLATINIB 100 MG PO TABS: 100.0000 mg | ORAL_TABLET | Freq: Every day | ORAL | Status: DC

## 2020-12-29 MED ORDER — DEXAMETHASONE SODIUM PHOSPHATE 10 MG/ML IJ SOLN
10.0000 mg | Freq: Once | INTRAMUSCULAR | Status: AC
  Administered 2020-12-29: 10 mg via INTRAVENOUS
  Filled 2020-12-29: qty 1

## 2020-12-29 MED ORDER — GABAPENTIN 300 MG PO CAPS
300.0000 mg | ORAL_CAPSULE | Freq: Every day | ORAL | Status: DC
  Administered 2020-12-29 – 2020-12-31 (×3): 300 mg via ORAL
  Filled 2020-12-29 (×3): qty 1

## 2020-12-29 MED ORDER — SODIUM CHLORIDE 0.9 % IV SOLN: INTRAVENOUS | Status: DC

## 2020-12-29 MED ORDER — IODIXANOL 320 MG/ML IV SOLN
INTRAVENOUS | Status: DC | PRN
  Administered 2020-12-29: 35 mL

## 2020-12-29 MED ORDER — INSULIN ASPART 100 UNIT/ML ~~LOC~~ SOLN
0.0000 [IU] | Freq: Every day | SUBCUTANEOUS | Status: DC
  Administered 2020-12-30 – 2020-12-31 (×2): 3 [IU] via SUBCUTANEOUS
  Filled 2020-12-29 (×2): qty 1

## 2020-12-29 MED ORDER — TAMSULOSIN HCL 0.4 MG PO CAPS
0.4000 mg | ORAL_CAPSULE | Freq: Every morning | ORAL | Status: DC
  Administered 2020-12-29 – 2021-01-01 (×4): 0.4 mg via ORAL
  Filled 2020-12-29 (×5): qty 1

## 2020-12-29 MED ORDER — INSULIN ASPART 100 UNIT/ML ~~LOC~~ SOLN
0.0000 [IU] | Freq: Three times a day (TID) | SUBCUTANEOUS | Status: DC
  Administered 2020-12-30: 3 [IU] via SUBCUTANEOUS
  Administered 2020-12-30 (×2): 2 [IU] via SUBCUTANEOUS
  Administered 2020-12-31: 18:00:00 5 [IU] via SUBCUTANEOUS
  Administered 2020-12-31 (×2): 3 [IU] via SUBCUTANEOUS
  Administered 2021-01-01: 10:00:00 1 [IU] via SUBCUTANEOUS
  Administered 2021-01-01: 3 [IU] via SUBCUTANEOUS
  Filled 2020-12-29 (×8): qty 1

## 2020-12-29 MED ORDER — MAGIC MOUTHWASH
5.0000 mL | Freq: Four times a day (QID) | ORAL | Status: DC
  Administered 2020-12-29 – 2021-01-01 (×12): 5 mL via ORAL
  Filled 2020-12-29 (×12): qty 5

## 2020-12-29 SURGICAL SUPPLY — 6 items
CANNULA 5F STIFF (CANNULA) ×2 IMPLANT
CATH ANGIO 5F PIGTAIL 65CM (CATHETERS) ×2 IMPLANT
KIT FEMORAL DEL DENALI (Miscellaneous) ×2 IMPLANT
PACK ANGIOGRAPHY (CUSTOM PROCEDURE TRAY) ×2 IMPLANT
SHEATH BRITE TIP 5FRX11 (SHEATH) ×2 IMPLANT
WIRE GUIDERIGHT .035X150 (WIRE) ×2 IMPLANT

## 2020-12-29 NOTE — Progress Notes (Signed)
PROGRESS NOTE                                                                             PROGRESS NOTE                                                                                                                                                                                                             Patient Demographics:    Melvin Willis, is a 70 y.o. male, DOB - Aug 31, 1951, TKZ:[id number]  Outpatient Primary MD for the patient is [name], MD    LOS - 1  Admit date - 12/28/2020    Chief Complaint  Patient presents with  . Urinary Retention  . Weakness       Brief Narrative    Melvin Willis is a 69 y.o. 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 Pt was started on ceftriaxone.  Venous Doppler ultrasound was positive for occlusive thrombus of bilateral lower posterior tibial veins.  Patient not on full anticoagulation due to known brain diastasis with hemorrhage, seen by vascular surgery, IVC filter inserted 4/24.   Subjective:    Melvin Willis today wakes up, he denies any complaints, but he is confused and unable to provide any appropriate complaints.     Assessment  & Plan :    Principal Problem:   UTI (urinary tract infection) Active Problems:   Type 2 diabetes mellitus with stage 3 chronic kidney disease (HCC)   Dyslipidemia   Primary malignant neoplasm of lung metastatic to other site [facility])   Brain metastasis ([place])   Acute-on-chronic kidney injury ([place])   Acute urinary retention   Leg DVT (deep venous thromboembolism), acute, bilateral (HCC)  UTI w/  chronic urinary  retention -pt has hx of BPH and had urinary retention with Foley catheter placed by urology on 4/7.  He has planned HOLEP procedure on 4/29.  - Foley changed out in ED with good flow -Continue with IV Rocephin, follow on urine cultures. -Continue tamsulosin and finasteride  Bilateral lower extremity occlusive thrombus of the posterior tibial veins -Has history of PE and bilateral lower extremity DVT.  Previously on Xarelto but has been held by oncology due to recent hemorrhagic metastasis. -Very difficult situation, with known history of PE, and apparently with worsening lower extremity DVT, who is high risk for full anticoagulation given known recent hemorrhagic brain metastasis discussed with oncology, and it is appropriate here to put IVC filter to a lessnen clot  burden on the lungs. -Vascular surgery consulted, IVC filter placed. -We will obtain MRI head without contrast for further evaluation of brain metastasis with possible hemorrhage to see if it is appropriate to resume her full anticoagulation as discussed with oncology.  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 Rogue Bussing, consult appreciated -Continue Keppra -MRI brain with/without contrast pending  AMS -We will obtain MRI head with without contrast, for further evaluation, meanwhile we will give 10 mg of IV Decadron 1 pending results.  Acute on chronic CKD stage III Creatinine of 1.52 from prior 1.09 Continuous IV fluids  Type 2 diabetes with hyperglycemia - Give 15U Novolog x 1  - sensitive SSI  - Continue Gabapentin   Hyperlipidemia -Continue statin    SpO2: 98 % O2 Flow Rate (L/min): 2 L/min  Recent Labs  Lab 12/27/20 1352 12/28/20 1702 12/28/20 2058 12/29/20 0436  WBC 6.8 7.9  --  6.1  PLT 142* 166  --  143*  BNP  --  38.3  --   --   AST 21 19  --   --   ALT 41 38  --   --   ALKPHOS 65 67  --   --   BILITOT 0.5  0.7  --   --   ALBUMIN 3.3* 3.7  --   --   LATICACIDVEN  --  2.7* 2.0*  --   SARSCOV2NAA  --  NEGATIVE  --   --        ABG  No results found for: PHART, PCO2ART, PO2ART, HCO3, TCO2, ACIDBASEDEF, O2SAT        Condition - Extremely Guarded  Family Communication  :  D/W daughter Hinton Dyer by Phone  Code Status :  Full  Consults  :  Medical oncology, vascular surgery  Procedures  :  IVC filter insertion    Status is: Inpatient  Remains inpatient appropriate because:Ongoing diagnostic testing needed not appropriate for outpatient work up, Unsafe d/c plan and IV treatments appropriate due to intensity of illness or inability to take PO   Dispo: The patient is from: Home              Anticipated d/c is to: Home              Patient currently is not medically stable to d/c.   Difficult to place patient No      DVT Prophylaxis  : SCDs given recent bilateral DVT, no medical prophylaxis given brain metastasis with recent hemorrhage  Lab Results  Component Value Date   PLT 143 (L) 12/29/2020    Diet :  Diet Order            Diet  NPO time specified  Diet effective now                  Inpatient Medications  Scheduled Meds: . atorvastatin  40 mg Oral Daily  . Chlorhexidine Gluconate Cloth  6 each Topical Daily  . finasteride  5 mg Oral Daily  . gabapentin  300 mg Oral QHS  . insulin aspart  0-5 Units Subcutaneous QHS  . insulin aspart  0-9 Units Subcutaneous TID WC  . levETIRAcetam  500 mg Oral BID  . lorlatinib  100 mg Oral Daily  . tamsulosin  0.4 mg Oral q AM   Continuous Infusions: . sodium chloride 75 mL/hr at 12/29/20 0139  . cefTRIAXone (ROCEPHIN)  IV     PRN Meds:.  Antibiotics  :    Anti-infectives (From admission, onward)   Start     Dose/Rate Route Frequency Ordered Stop   12/29/20 1800  cefTRIAXone (ROCEPHIN) 1 g in sodium chloride 0.9 % 100 mL IVPB        1 g 200 mL/hr over 30 Minutes Intravenous Every 24 hours 12/28/20 2111     12/28/20  1900  cefTRIAXone (ROCEPHIN) 1 g in sodium chloride 0.9 % 100 mL IVPB        1 g 200 mL/hr over 30 Minutes Intravenous  Once 12/28/20 1848 12/28/20 2122       Phillips Climes M.D on 12/29/2020 at 12:27 PM  To page go to [email or web address]  [facility] -  Office  [phone]      Objective:   Vitals:   12/29/20 1134 12/29/20 1135 12/29/20 1140 12/29/20 1145  BP: 119/72  117/75 123/77  Pulse: 77  72   Resp: 17  18 17   Temp:      TempSrc:      SpO2: 99% 99% 98%   Weight:      Height:        Wt Readings from Last 3 Encounters:  12/28/20 72.5 kg  12/27/20 73.2 kg  12/13/20 71.2 kg     Intake/Output Summary (Last 24 hours) at 12/29/2020 1227 Last data filed at 12/29/2020 0400 Gross per 24 hour  Intake 297.82 ml  Output 750 ml  Net -452.18 ml     Physical Exam  Sleeping comfortably, open eyes to verbal stimuli, communicative, confused with impaired cognition and insight  Symmetrical Chest wall movement, Good air movement bilaterally, CTAB RRR,No Gallops,Rubs or new Murmurs, No Parasternal Heave +ve B.Sounds, Abd Soft, No tenderness,  No rebound - guarding or rigidity. No Cyanosis, Clubbing ,+1 edema, No new Rash or bruise      Data Review:    CBC Recent Labs  Lab 12/27/20 1352 12/28/20 1702 12/29/20 0436  WBC 6.8 7.9 6.1  HGB 14.1 14.1 12.7*  HCT 41.5 42.0 36.7*  PLT 142* 166 143*  MCV 89.2 88.8 87.2  MCH 30.3 29.8 30.2  MCHC 34.0 33.6 34.6  RDW 15.4 15.4 15.6*  LYMPHSABS 1.9 2.2  --   MONOABS 0.7 0.8  --   EOSABS 0.1 0.0  --   BASOSABS 0.0 0.0  --     Recent Labs  Lab 12/27/20 1352 12/28/20 1702 12/28/20 2058 12/29/20 0436  NA 137 132*  --  139  K 4.1 4.1  --  3.5  CL 103 101  --  109  CO2 24 20*  --  24  GLUCOSE 325* 368*  --  101*  BUN 13 17  --  12  CREATININE 1.09 1.52*  --  1.16  CALCIUM 9.5 10.1  --  9.3  AST 21 19  --   --   ALT 41 38  --   --   ALKPHOS 65 67  --   --   BILITOT 0.5 0.7  --   --   ALBUMIN 3.3* 3.7  --    --   LATICACIDVEN  --  2.7* 2.0*  --   BNP  --  38.3  --   --     ------------------------------------------------------------------------------------------------------------------ No results for input(s): CHOL, HDL, LDLCALC, TRIG, CHOLHDL, LDLDIRECT in the last 72 hours.  Lab Results  Component Value Date   HGBA1C 10.5 (A) 10/31/2020   ------------------------------------------------------------------------------------------------------------------ No results for input(s): TSH, T4TOTAL, T3FREE, THYROIDAB in the last 72 hours.  Invalid input(s): FREET3  Cardiac Enzymes No results for input(s): CKMB, TROPONINI, MYOGLOBIN in the last 168 hours.  Invalid input(s): CK ------------------------------------------------------------------------------------------------------------------    Component Value Date/Time   BNP 38.3 12/28/2020 1702    Micro Results Recent Results (from the past 240 hour(s))  CULTURE, URINE COMPREHENSIVE     Status: Abnormal (Preliminary result)   Collection Time: 12/24/20 11:04 AM   Specimen: Urine   UR  Result Value Ref Range Status   Urine Culture, Comprehensive Preliminary report (A)  Preliminary   Organism ID, Bacteria Comment (A)  Preliminary    Comment: Vancomycin-resistant Enterococcus (Enterococcus faecalis) Greater than 100,000 colony forming units per mL   Microscopic Examination     Status: Abnormal   Collection Time: 12/24/20 11:04 AM   Urine  Result Value Ref Range Status   WBC, UA 6-10 (A) 0 - 5 /hpf Final   RBC 3-10 (A) 0 - 2 /hpf Final   Epithelial Cells (non renal) 0-10 0 - 10 /hpf Final   Crystals Present (A) N/A Final   Crystal Type Amorphous Sediment N/A Final   Bacteria, UA Few None seen/Few Final  Resp Panel by RT-PCR (Flu A&B, Covid) Nasopharyngeal Swab     Status: None   Collection Time: 12/28/20  5:02 PM   Specimen: Nasopharyngeal Swab; Nasopharyngeal(NP) swabs in vial transport medium  Result Value Ref Range Status   SARS  Coronavirus 2 by RT PCR NEGATIVE NEGATIVE Final    Comment: (NOTE) SARS-CoV-2 target nucleic acids are NOT DETECTED.  The SARS-CoV-2 RNA is generally detectable in upper respiratory specimens during the acute phase of infection. The lowest concentration of SARS-CoV-2 viral copies this assay can detect is 138 copies/mL. A negative result does not preclude SARS-Cov-2 infection and should not be used as the sole basis for treatment or other patient management decisions. A negative result may occur with  improper specimen collection/handling, submission of specimen other than nasopharyngeal swab, presence of viral mutation(s) within the areas targeted by this assay, and inadequate number of viral copies(<138 copies/mL). A negative result must be combined with clinical observations, patient history, and epidemiological information. The expected result is Negative.  Fact Sheet for Patients:  EntrepreneurPulse.com.au  Fact Sheet for Healthcare Providers:  IncredibleEmployment.be  This test is no t yet approved or cleared by the [place] FDA and  has been authorized for detection and/or diagnosis of SARS-CoV-2 by FDA under an Emergency Use Authorization (EUA). This EUA will remain  in effect (meaning this test can be used) for the duration of the COVID-19 declaration under Section 564(b)(1) of the Act, 21 U.S.C.section 360bbb-3(b)(1), unless the authorization is terminated  or revoked sooner.       Influenza A by PCR NEGATIVE NEGATIVE  Final   Influenza B by PCR NEGATIVE NEGATIVE Final    Comment: (NOTE) The Xpert Xpress SARS-CoV-2/FLU/RSV plus assay is intended as an aid in the diagnosis of influenza from Nasopharyngeal swab specimens and should not be used as a sole basis for treatment. Nasal washings and aspirates are unacceptable for Xpert Xpress SARS-CoV-2/FLU/RSV testing.  Fact Sheet for  Patients: EntrepreneurPulse.com.au  Fact Sheet for Healthcare Providers: IncredibleEmployment.be  This test is not yet approved or cleared by the [place] FDA and has been authorized for detection and/or diagnosis of SARS-CoV-2 by FDA under an Emergency Use Authorization (EUA). This EUA will remain in effect (meaning this test can be used) for the duration of the COVID-19 declaration under Section 564(b)(1) of the Act, 21 U.S.C. section 360bbb-3(b)(1), unless the authorization is terminated or revoked.  Performed at [facility], [address], [place], [place] [address]     Radiology Reports US Venous Img Lower Bilateral  Result Date: 12/28/2020 CLINICAL DATA:  Leg swelling for 1 week. Patient has a history of lung malignancy and prior DVT. EXAM: BILATERAL LOWER EXTREMITY VENOUS DOPPLER ULTRASOUND TECHNIQUE: Gray-scale sonography with graded compression, as well as color Doppler and duplex ultrasound were performed to evaluate the lower extremity deep venous systems from the level of the common femoral vein and including the common femoral, femoral, profunda femoral, popliteal and calf veins including the posterior tibial, peroneal and gastrocnemius veins when visible. The superficial great saphenous vein was also interrogated. Spectral Doppler was utilized to evaluate flow at rest and with distal augmentation maneuvers in the common femoral, femoral and popliteal veins. COMPARISON:  Lower extremity ultrasound dated 06/03/2016. FINDINGS: RIGHT LOWER EXTREMITY Common Femoral Vein: No evidence of thrombus. Normal compressibility, respiratory phasicity and response to augmentation. Saphenofemoral Junction: No evidence of thrombus. Normal compressibility and flow on color Doppler imaging. Profunda Femoral Vein: No evidence of thrombus. Normal compressibility and flow on color Doppler imaging. Femoral Vein: No evidence of thrombus. Normal  compressibility, respiratory phasicity and response to augmentation. Popliteal Vein: No evidence of thrombus. Normal compressibility, respiratory phasicity and response to augmentation. Calf Veins: Thrombus is noted in the posterior tibial vein. Superficial Great Saphenous Vein: No evidence of thrombus. Normal compressibility. Venous Reflux:  None. Other Findings:  None. LEFT LOWER EXTREMITY Common Femoral Vein: No evidence of thrombus. Normal compressibility, respiratory phasicity and response to augmentation. Saphenofemoral Junction: No evidence of thrombus. Normal compressibility and flow on color Doppler imaging. Profunda Femoral Vein: No evidence of thrombus. Normal compressibility and flow on color Doppler imaging. Femoral Vein: No evidence of thrombus. Normal compressibility, respiratory phasicity and response to augmentation. Popliteal Vein: No evidence of thrombus. Normal compressibility, respiratory phasicity and response to augmentation. Calf Veins: Thrombus is noted in the posterior tibial vein. Superficial Great Saphenous Vein: No evidence of thrombus. Normal compressibility. Venous Reflux:  None. Other Findings:  None. IMPRESSION: Occlusive thrombus in the bilateral posterior tibial veins. Electronically Signed   By: Zerita Boers M.D.   On: 12/28/2020 18:57   DG Chest Portable 1 View  Result Date: 12/28/2020 CLINICAL DATA:  Shortness of breath. EXAM: PORTABLE CHEST 1 VIEW COMPARISON:  August 08, 2018 FINDINGS: Post treatment changes are seen in the left upper lobe. The heart, hila, mediastinum, lungs, and pleura are otherwise unremarkable. IMPRESSION: No acute interval change. Electronically Signed   By: Dorise Bullion III M.D   On: 12/28/2020 17:51

## 2020-12-29 NOTE — H&P (Signed)
History and Physical    Melvin Willis WFU:[id number] DOB: 10/25/1950 DOA: 12/28/2020  PCP: [name], MD  Patient coming from: Home  I have personally briefly reviewed patient's old medical records in [place]  Chief Complaint: urinary retension  HPI: Melvin Willis is a 70 y.o. 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  Patient alert and oriented only to self and place.  He is unable to provide any history and wife is not at bedside.  Per ED documentation, patient has been having difficulty with urine coming out of his Foley catheter.  He has planned procedure with urology on his prostate next week.  They also notes increase swelling in his legs that have worsened in the past week.  Foley was replaced in the ED with UA showing UTI. Pt was started on ceftriaxone.  Venous Doppler ultrasound was positive for occlusive thrombus of bilateral lower posterior tibial veins.  EDP discussed with vascular surgery Dr.Albrecht who would be able to place IVC filter if recommended by oncology.  Oncology Dr. Janese Banks was notified and recommends IVC filter and will discuss anticoagulation with his oncologist.    Review of Systems: Unable to obtain since pt has cognitive impairment likely due to brain mets  Past Medical History:  Diagnosis Date  . Abnormal prostate specific antigen 08/08/2012  . Adiposity 04/16/2015  . Chronic kidney disease (CKD), stage III (moderate) ([place]) 11/27/2016  . CVA (cerebral vascular accident) ([place]) 06/17/2016  . Diabetes mellitus without complication ([place])   . Diverticulosis of sigmoid colon 04/16/2015  . Dyslipidemia 03/18/2015  . Hemorrhoids,  internal 04/16/2015  . Hypercholesteremia 04/16/2015  . Hyperlipidemia   . Hypertension   . Primary cancer of left upper lobe of lung ([place])   . Pulmonary embolism ([place])   . Wears dentures    partial upper    Past Surgical History:  Procedure Laterality Date  . COLONOSCOPY    . COLONOSCOPY WITH PROPOFOL N/A 05/06/2015   Procedure: COLONOSCOPY WITH PROPOFOL;  Surgeon: Lucilla Lame, MD;  Location: [facility];  Service: Endoscopy;  Laterality: N/A;  ASCENDING COLON POLYPS X 2 TERMINAL ILEUM BIOPSY RANDOM COLON BX. TRANSVERSE COLON POLYP SIGMOID COLON POLYP  . ESOPHAGOGASTRODUODENOSCOPY (EGD) WITH PROPOFOL N/A 05/06/2015   Procedure: ESOPHAGOGASTRODUODENOSCOPY (EGD) WITH PROPOFOL;  Surgeon: Lucilla Lame, MD;  Location: [facility];  Service: Endoscopy;  Laterality: N/A;  GASTRIC BIOPSY X1  . KIDNEY STONE SURGERY  2017     reports that he has never smoked. He has never used smokeless tobacco. He reports that he does not drink alcohol and does not use drugs. Social History  No Known Allergies  Family History  Problem Relation Age of Onset  . Diabetes Mother   . Diabetes Father   . CAD Father   . Dementia Father   . Diabetes Sister   . Cancer Maternal Uncle        Prostate  . Cancer Cousin        prostate     Prior to Admission medications   Medication Sig Start Date End Date Taking? Authorizing Provider  atorvastatin (LIPITOR) 40 MG tablet Take 1 tablet (40 mg total) by mouth daily. 12/24/20   [name], MD  blood  glucose meter kit and supplies Dispense based on patient and insurance preference (Accuchek Aviva Plus, Accuchek Nano). Use up to four times daily as directed. (FOR ICD-10 E10.9, E11.9). 05/08/19   [name], MD  finasteride (PROSCAR) 5 MG tablet Take 1 tablet (5 mg total) by mouth daily. 06/13/20   Billey Co, MD  gabapentin (NEURONTIN) 300 MG capsule Take 1 capsule by mouth at bedtime Patient taking differently: Take 300 mg by mouth at  bedtime. 12/02/20   Cammie Sickle, MD  glipiZIDE (GLUCOTROL) 5 MG tablet Take 1 tablet (5 mg total) by mouth 2 (two) times daily before a meal. 11/29/20   Cammie Sickle, MD  Insulin Glargine-Lixisenatide 100-33 UNT-MCG/ML SOPN Inject 15 Units into the skin daily with lunch.    [provider]  Insulin Pen Needle (NOVOFINE PEN NEEDLE) 32G X 6 MM MISC 1 each by Does not apply route daily. 10/31/20   [name], MD  levETIRAcetam (KEPPRA) 500 MG tablet Take 1 tablet (500 mg total) by mouth 2 (two) times daily. 12/23/20 01/22/21  Cammie Sickle, MD  lorlatinib (LORBRENA) 100 MG tablet Take 1 tablet (100 mg total) by mouth daily. Swallow tablets whole. Do not chew, crush or split tablets. 11/29/20   Cammie Sickle, MD  sulfamethoxazole-trimethoprim (BACTRIM DS) 800-160 MG tablet Take 1 tablet by mouth 2 (two) times daily for 10 days. Patient not taking: Reported on 12/27/2020 12/27/20 01/06/21  Billey Co, MD  tamsulosin (FLOMAX) 0.4 MG CAPS capsule Take 1 capsule by mouth once daily in the morning Patient taking differently: Take 0.4 mg by mouth in the morning. 12/11/19   Abbie Sons, MD    Physical Exam: Vitals:   12/28/20 2000 12/28/20 2100 12/28/20 2121 12/28/20 2213  BP: (!) 146/87 128/87 128/87 131/77  Pulse:   93 84  Resp: (!) 21 (!) 25 (!) 21 17  Temp:   98.6 F (37 C) 99.1 F (37.3 C)  TempSrc:   Oral Oral  SpO2:   96% 100%    Constitutional: NAD, calm, comfortable, elderly male laying flat in bed Vitals:   12/28/20 2000 12/28/20 2100 12/28/20 2121 12/28/20 2213  BP: (!) 146/87 128/87 128/87 131/77  Pulse:   93 84  Resp: (!) 21 (!) 25 (!) 21 17  Temp:   98.6 F (37 C) 99.1 F (37.3 C)  TempSrc:   Oral Oral  SpO2:   96% 100%   Eyes: PERRL, lids and conjunctivae normal ENMT: Mucous membranes are moist.  Neck: normal, supple Respiratory: clear to auscultation bilaterally, no wheezing, no crackles. Normal respiratory effort. No  accessory muscle use.  Cardiovascular: Regular rate and rhythm, no murmurs / rubs / gallops.  Mild nonpitting edema of the lower extremity 2+ pedal pulses. Abdomen: no tenderness, no masses palpated.Bowel sounds positive.  Musculoskeletal: no clubbing / cyanosis. No joint deformity upper and lower extremities. Good ROM, no contractures. Normal muscle tone.  Skin: no rashes, lesions, ulcers. No induration Neurologic: Alert and oriented to self and place only.  CN 2-12 grossly intact. Sensation intact,  Strength 5/5 in all 4.  Psychiatric: Flat affect.    Labs on Admission: I have personally reviewed following labs and imaging studies  CBC: Recent Labs  Lab 12/27/20 1352 12/28/20 1702  WBC 6.8 7.9  NEUTROABS 4.0 4.7  HGB 14.1 14.1  HCT 41.5 42.0  MCV 89.2 88.8  PLT 142* 235   Basic Metabolic Panel: Recent Labs  Lab 12/27/20 1352 12/28/20 1702  NA 137 132*  K 4.1 4.1  CL 103 101  CO2 24 20*  GLUCOSE 325* 368*  BUN 13 17  CREATININE 1.09 1.52*  CALCIUM 9.5 10.1   GFR: Estimated Creatinine Clearance: 43.2 mL/min (A) (by C-G formula based on SCr of 1.52 mg/dL (H)). Liver Function Tests: Recent Labs  Lab 12/27/20 1352 12/28/20 1702  AST 21 19  ALT 41 38  ALKPHOS 65 67  BILITOT 0.5 0.7  PROT 6.5 7.2  ALBUMIN 3.3* 3.7   No results for input(s): LIPASE, AMYLASE in the last 168 hours. No results for input(s): AMMONIA in the last 168 hours. Coagulation Profile: No results for input(s): INR, PROTIME in the last 168 hours. Cardiac Enzymes: No results for input(s): CKTOTAL, CKMB, CKMBINDEX, TROPONINI in the last 168 hours. BNP (last 3 results) No results for input(s): PROBNP in the last 8760 hours. HbA1C: No results for input(s): HGBA1C in the last 72 hours. CBG: Recent Labs  Lab 12/28/20 1659  GLUCAP 362*   Lipid Profile: No results for input(s): CHOL, HDL, LDLCALC, TRIG, CHOLHDL, LDLDIRECT in the last 72 hours. Thyroid Function Tests: No results for input(s):  TSH, T4TOTAL, FREET4, T3FREE, THYROIDAB in the last 72 hours. Anemia Panel: No results for input(s): VITAMINB12, FOLATE, FERRITIN, TIBC, IRON, RETICCTPCT in the last 72 hours. Urine analysis:    Component Value Date/Time   COLORURINE AMBER (A) 12/28/2020 1701   APPEARANCEUR CLOUDY (A) 12/28/2020 1701   APPEARANCEUR Hazy (A) 12/24/2020 1104   LABSPEC 1.013 12/28/2020 1701   PHURINE 6.0 12/28/2020 1701   GLUCOSEU >=500 (A) 12/28/2020 1701   HGBUR MODERATE (A) 12/28/2020 1701   BILIRUBINUR NEGATIVE 12/28/2020 1701   BILIRUBINUR Negative 12/24/2020 1104   KETONESUR NEGATIVE 12/28/2020 1701   PROTEINUR 100 (A) 12/28/2020 1701   NITRITE NEGATIVE 12/28/2020 1701   LEUKOCYTESUR SMALL (A) 12/28/2020 1701    Radiological Exams on Admission: US Venous Img Lower Bilateral  Result Date: 12/28/2020 CLINICAL DATA:  Leg swelling for 1 week. Patient has a history of lung malignancy and prior DVT. EXAM: BILATERAL LOWER EXTREMITY VENOUS DOPPLER ULTRASOUND TECHNIQUE: Gray-scale sonography with graded compression, as well as color Doppler and duplex ultrasound were performed to evaluate the lower extremity deep venous systems from the level of the common femoral vein and including the common femoral, femoral, profunda femoral, popliteal and calf veins including the posterior tibial, peroneal and gastrocnemius veins when visible. The superficial great saphenous vein was also interrogated. Spectral Doppler was utilized to evaluate flow at rest and with distal augmentation maneuvers in the common femoral, femoral and popliteal veins. COMPARISON:  Lower extremity ultrasound dated 06/03/2016. FINDINGS: RIGHT LOWER EXTREMITY Common Femoral Vein: No evidence of thrombus. Normal compressibility, respiratory phasicity and response to augmentation. Saphenofemoral Junction: No evidence of thrombus. Normal compressibility and flow on color Doppler imaging. Profunda Femoral Vein: No evidence of thrombus. Normal  compressibility and flow on color Doppler imaging. Femoral Vein: No evidence of thrombus. Normal compressibility, respiratory phasicity and response to augmentation. Popliteal Vein: No evidence of thrombus. Normal compressibility, respiratory phasicity and response to augmentation. Calf Veins: Thrombus is noted in the posterior tibial vein. Superficial Great Saphenous Vein: No evidence of thrombus. Normal compressibility. Venous Reflux:  None. Other Findings:  None. LEFT LOWER EXTREMITY Common Femoral Vein: No evidence of thrombus. Normal compressibility, respiratory phasicity and response to augmentation. Saphenofemoral Junction: No evidence of thrombus. Normal compressibility and flow on color Doppler imaging. Profunda Femoral Vein: No evidence of thrombus. Normal compressibility and flow on color  Doppler imaging. Femoral Vein: No evidence of thrombus. Normal compressibility, respiratory phasicity and response to augmentation. Popliteal Vein: No evidence of thrombus. Normal compressibility, respiratory phasicity and response to augmentation. Calf Veins: Thrombus is noted in the posterior tibial vein. Superficial Great Saphenous Vein: No evidence of thrombus. Normal compressibility. Venous Reflux:  None. Other Findings:  None. IMPRESSION: Occlusive thrombus in the bilateral posterior tibial veins. Electronically Signed   By: Zerita Boers M.D.   On: 12/28/2020 18:57   DG Chest Portable 1 View  Result Date: 12/28/2020 CLINICAL DATA:  Shortness of breath. EXAM: PORTABLE CHEST 1 VIEW COMPARISON:  August 08, 2018 FINDINGS: Post treatment changes are seen in the left upper lobe. The heart, hila, mediastinum, lungs, and pleura are otherwise unremarkable. IMPRESSION: No acute interval change. Electronically Signed   By: Dorise Bullion III M.D   On: 12/28/2020 17:51      Assessment/Plan  UTI w/ chronic urinary retention -pt has hx of BPH and had urinary retention with Foley catheter placed by urology on 4/7.   He has planned HOLEP procedure on 4/29.  - Foley changed out in ED with good flow - UA positive on admit. Urology started him on Bactrim 2 days ago for UTI due to pt's family concern of him being confused.  - continue IV Rocephin  - urine culture pending  -Continue tamsulosin and finasteride  Bilateral lower extremity occlusive thrombus of the posterior tibial veins -Has history of PE and bilateral lower extremity DVT.  Previously on Xarelto but has been held by oncology due to recent hemorrhagic metastasis -ED physician Dr. Jari Pigg discussed with oncology Dr. Janese Banks who recommend IVC filter and will discuss with his oncologist regarding anticoagulation on Monday  EP physician also discussed with vascular surgery who will put in IVC if oncology recommends - need formal oncology consult in the morning   Acute on chronic CKD stage III Creatinine of 1.52 from prior 1.09 Continuous IV fluids  Type 2 diabetes with hyperglycemia - Give 15U Novolog x 1  - sensitive SSI  - Continue Gabapentin  Adenocarcinoma of the lung with brain metastasis -s/p whole brain radiation on 12/02/2020 - Follows with Dr. Rogue Bussing -Continue Keppra  Hyperlipidemia -Continue statin  DVT prophylaxis:.Lovenox Code Status: Full Family Communication: No family at bedside  disposition Plan: Home with at least 2 midnight stays  Consults called: oncology  Admission status: inpatient  Level of care: Med-Surg  Status is: Inpatient  Remains inpatient appropriate because:Inpatient level of care appropriate due to severity of illness   Dispo: The patient is from: Home              Anticipated d/c is to: Home              Patient currently is medically unstable for discharge   Difficult to place patient No         Orene Desanctis DO [facility]   If 7PM-7AM, please contact night-coverage [email or web address]   12/29/2020, 12:13 AM

## 2020-12-29 NOTE — Consult Note (Addendum)
Reason for Consult:IVC filter Referring Physician: Elgergawy  Melvin Willis is an 70 y.o. male.  HPI: 70 yo gentleman admitted for UTI and DVT.  Family noticed increased swelling in his legs past week.  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  Was previously on xarelto for LLE DVT and PE but this was stopped Mar 10 with a hemorrhagic brain met conversion.  I am consulted for an IVC filter placement.  Past Medical History:  Diagnosis Date  . Abnormal prostate specific antigen 08/08/2012  . Adiposity 04/16/2015  . Chronic kidney disease (CKD), stage III (moderate) ([place]) 11/27/2016  . CVA (cerebral vascular accident) ([place]) 06/17/2016  . Diabetes mellitus without complication ([place])   . Diverticulosis of sigmoid colon 04/16/2015  . Dyslipidemia 03/18/2015  . Hemorrhoids, internal 04/16/2015  . Hypercholesteremia 04/16/2015  . Hyperlipidemia   . Hypertension   . Primary cancer of left upper lobe of lung ([place])   . Pulmonary embolism ([place])   . Wears dentures    partial upper    Past Surgical History:  Procedure Laterality Date  . COLONOSCOPY    . COLONOSCOPY WITH PROPOFOL N/A 05/06/2015   Procedure: COLONOSCOPY WITH PROPOFOL;  Surgeon: Lucilla Lame, MD;  Location: [facility];  Service: Endoscopy;  Laterality: N/A;  ASCENDING COLON POLYPS X 2 TERMINAL ILEUM BIOPSY RANDOM COLON BX. TRANSVERSE COLON POLYP SIGMOID COLON POLYP  . ESOPHAGOGASTRODUODENOSCOPY (EGD) WITH PROPOFOL N/A 05/06/2015   Procedure: ESOPHAGOGASTRODUODENOSCOPY (EGD) WITH PROPOFOL;  Surgeon: Lucilla Lame, MD;  Location: [facility];  Service: Endoscopy;  Laterality: N/A;  GASTRIC BIOPSY X1  . KIDNEY STONE SURGERY  2017    Family History  Problem Relation Age of Onset  . Diabetes Mother   . Diabetes Father   . CAD Father   . Dementia Father   . Diabetes Sister   . Cancer Maternal Uncle        Prostate  . Cancer Cousin        prostate    Social History:  reports that he has never smoked.  He has never used smokeless tobacco. He reports that he does not drink alcohol and does not use drugs.  Allergies: No Known Allergies  Medications: I have reviewed the patient's current medications.  Results for orders placed or performed during the hospital encounter of 12/28/20 (from the past 48 hour(s))  CBG monitoring, ED     Status: Abnormal   Collection Time: 12/28/20  4:59 PM  Result Value Ref Range   Glucose-Capillary 362 (H) 70 - 99 mg/dL    Comment: Glucose reference range applies only to samples taken after fasting for at least 8 hours.  Urinalysis, Complete w Microscopic Urine, Catheterized     Status: Abnormal   Collection Time: 12/28/20  5:01 PM  Result Value Ref Range   Color, Urine AMBER (A) YELLOW    Comment: BIOCHEMICALS MAY BE AFFECTED BY COLOR   APPearance CLOUDY (A) CLEAR   Specific Gravity, Urine 1.013 1.005 - 1.030   pH 6.0 5.0 - 8.0   Glucose, UA >=500 (A) NEGATIVE mg/dL   Hgb urine dipstick MODERATE (A) NEGATIVE   Bilirubin Urine NEGATIVE NEGATIVE   Ketones, ur NEGATIVE NEGATIVE mg/dL   Protein, ur 100 (A) NEGATIVE mg/dL   Nitrite NEGATIVE NEGATIVE   Leukocytes,Ua SMALL (A) NEGATIVE   RBC / HPF >50 (H) 0 - 5 RBC/hpf   WBC, UA >50 (H) 0 - 5 WBC/hpf   Bacteria, UA RARE (A)  NONE SEEN   Squamous Epithelial / LPF NONE SEEN 0 - 5    Comment: Performed at [facility], [address] [place], [place] [address]  CBC with Differential     Status: None   Collection Time: 12/28/20  5:02 PM  Result Value Ref Range   WBC 7.9 4.0 - 10.5 K/uL   RBC 4.73 4.22 - 5.81 MIL/uL   Hemoglobin 14.1 13.0 - 17.0 g/dL   HCT 42.0 39.0 - 52.0 %   MCV 88.8 80.0 - 100.0 fL   MCH 29.8 26.0 - 34.0 pg   MCHC 33.6 30.0 - 36.0 g/dL   RDW 15.4 11.5 - 15.5 %   Platelets 166 150 - 400 K/uL   nRBC 0.0 0.0 - 0.2 %   Neutrophils Relative % 60 %   Neutro Abs 4.7 1.7 - 7.7 K/uL   Lymphocytes Relative 28 %   Lymphs Abs 2.2 0.7 - 4.0 K/uL   Monocytes Relative 10 %   Monocytes  Absolute 0.8 0.1 - 1.0 K/uL   Eosinophils Relative 1 %   Eosinophils Absolute 0.0 0.0 - 0.5 K/uL   Basophils Relative 0 %   Basophils Absolute 0.0 0.0 - 0.1 K/uL   Immature Granulocytes 1 %   Abs Immature Granulocytes 0.07 0.00 - 0.07 K/uL    Comment: Performed at [facility], [address] [place], [place] [address]  Comprehensive metabolic panel     Status: Abnormal   Collection Time: 12/28/20  5:02 PM  Result Value Ref Range   Sodium 132 (L) 135 - 145 mmol/L   Potassium 4.1 3.5 - 5.1 mmol/L   Chloride 101 98 - 111 mmol/L   CO2 20 (L) 22 - 32 mmol/L   Glucose, Bld 368 (H) 70 - 99 mg/dL    Comment: Glucose reference range applies only to samples taken after fasting for at least 8 hours.   BUN 17 8 - 23 mg/dL   Creatinine, Ser 1.52 (H) 0.61 - 1.24 mg/dL   Calcium 10.1 8.9 - 10.3 mg/dL   Total Protein 7.2 6.5 - 8.1 g/dL   Albumin 3.7 3.5 - 5.0 g/dL   AST 19 15 - 41 U/L   ALT 38 0 - 44 U/L   Alkaline Phosphatase 67 38 - 126 U/L   Total Bilirubin 0.7 0.3 - 1.2 mg/dL   GFR, Estimated 50 (L) >60 mL/min    Comment: (NOTE) Calculated using the CKD-EPI Creatinine Equation (2021)    Anion gap 11 5 - 15    Comment: Performed at [facility], [place], [place] [address]  Troponin I (High Sensitivity)     Status: None   Collection Time: 12/28/20  5:02 PM  Result Value Ref Range   Troponin I (High Sensitivity) 11 <18 ng/L    Comment: (NOTE) Elevated high sensitivity troponin I (hsTnI) values and significant  changes across serial measurements may suggest ACS but many other  chronic and acute conditions are known to elevate hsTnI results.  Refer to the "Links" section for chest pain algorithms and additional  guidance. Performed at [facility], [address] [place], [place] [address]   Brain natriuretic peptide     Status: None   Collection Time: 12/28/20  5:02 PM  Result Value Ref Range   B Natriuretic Peptide 38.3 0.0 - 100.0  pg/mL    Comment: Performed at [facility], [place], [place] [address]  Lactic acid, plasma  Status: Abnormal   Collection Time: 12/28/20  5:02 PM  Result Value Ref Range   Lactic Acid, Venous 2.7 (HH) 0.5 - 1.9 mmol/L    Comment: CRITICAL RESULT CALLED TO, READ BACK BY AND VERIFIED WITH JACQUE FERGUSON ON 12/28/20 AT 1739 QSD Performed at [facility], [address] [place], [place] [address]   Resp Panel by RT-PCR (Flu A&B, Covid) Nasopharyngeal Swab     Status: None   Collection Time: 12/28/20  5:02 PM   Specimen: Nasopharyngeal Swab; Nasopharyngeal(NP) swabs in vial transport medium  Result Value Ref Range   SARS Coronavirus 2 by RT PCR NEGATIVE NEGATIVE    Comment: (NOTE) SARS-CoV-2 target nucleic acids are NOT DETECTED.  The SARS-CoV-2 RNA is generally detectable in upper respiratory specimens during the acute phase of infection. The lowest concentration of SARS-CoV-2 viral copies this assay can detect is 138 copies/mL. A negative result does not preclude SARS-Cov-2 infection and should not be used as the sole basis for treatment or other patient management decisions. A negative result may occur with  improper specimen collection/handling, submission of specimen other than nasopharyngeal swab, presence of viral mutation(s) within the areas targeted by this assay, and inadequate number of viral copies(<138 copies/mL). A negative result must be combined with clinical observations, patient history, and epidemiological information. The expected result is Negative.  Fact Sheet for Patients:  EntrepreneurPulse.com.au  Fact Sheet for Healthcare Providers:  IncredibleEmployment.be  This test is no t yet approved or cleared by the [place] FDA and  has been authorized for detection and/or diagnosis of SARS-CoV-2 by FDA under an Emergency Use Authorization (EUA). This EUA will remain  in effect  (meaning this test can be used) for the duration of the COVID-19 declaration under Section 564(b)(1) of the Act, 21 U.S.C.section 360bbb-3(b)(1), unless the authorization is terminated  or revoked sooner.       Influenza A by PCR NEGATIVE NEGATIVE   Influenza B by PCR NEGATIVE NEGATIVE    Comment: (NOTE) The Xpert Xpress SARS-CoV-2/FLU/RSV plus assay is intended as an aid in the diagnosis of influenza from Nasopharyngeal swab specimens and should not be used as a sole basis for treatment. Nasal washings and aspirates are unacceptable for Xpert Xpress SARS-CoV-2/FLU/RSV testing.  Fact Sheet for Patients: EntrepreneurPulse.com.au  Fact Sheet for Healthcare Providers: IncredibleEmployment.be  This test is not yet approved or cleared by the [place] FDA and has been authorized for detection and/or diagnosis of SARS-CoV-2 by FDA under an Emergency Use Authorization (EUA). This EUA will remain in effect (meaning this test can be used) for the duration of the COVID-19 declaration under Section 564(b)(1) of the Act, 21 U.S.C. section 360bbb-3(b)(1), unless the authorization is terminated or revoked.  Performed at [facility], [address] [place], [place] [address]   Lactic acid, plasma     Status: Abnormal   Collection Time: 12/28/20  8:58 PM  Result Value Ref Range   Lactic Acid, Venous 2.0 (HH) 0.5 - 1.9 mmol/L    Comment: CRITICAL VALUE NOTED. VALUE IS CONSISTENT WITH PREVIOUSLY REPORTED/CALLED VALUE  ADL Performed at [facility], [place],  [address]   Troponin I (High Sensitivity)     Status: None   Collection Time: 12/28/20  8:58 PM  Result Value Ref Range   Troponin I (High Sensitivity) 16 <18 ng/L    Comment: (NOTE) Elevated high sensitivity troponin I (hsTnI) values and significant  changes across serial measurements may suggest ACS but  many other  chronic and acute conditions are  known to elevate hsTnI results.  Refer to the "Links" section for chest pain algorithms and additional  guidance. Performed at [facility], [address] [place], [place] [address]   Glucose, capillary     Status: Abnormal   Collection Time: 12/29/20  1:41 AM  Result Value Ref Range   Glucose-Capillary 159 (H) 70 - 99 mg/dL    Comment: Glucose reference range applies only to samples taken after fasting for at least 8 hours.  Basic metabolic panel     Status: Abnormal   Collection Time: 12/29/20  4:36 AM  Result Value Ref Range   Sodium 139 135 - 145 mmol/L    Comment: RESULTS VERIFIED BY REPEAT TESTING MF   Potassium 3.5 3.5 - 5.1 mmol/L   Chloride 109 98 - 111 mmol/L   CO2 24 22 - 32 mmol/L   Glucose, Bld 101 (H) 70 - 99 mg/dL    Comment: Glucose reference range applies only to samples taken after fasting for at least 8 hours.   BUN 12 8 - 23 mg/dL   Creatinine, Ser 1.16 0.61 - 1.24 mg/dL   Calcium 9.3 8.9 - 10.3 mg/dL   GFR, Estimated >60 >60 mL/min    Comment: (NOTE) Calculated using the CKD-EPI Creatinine Equation (2021)    Anion gap 6 5 - 15    Comment: Performed at [facility], [address] [place], [place] [address]  CBC     Status: Abnormal   Collection Time: 12/29/20  4:36 AM  Result Value Ref Range   WBC 6.1 4.0 - 10.5 K/uL   RBC 4.21 (L) 4.22 - 5.81 MIL/uL   Hemoglobin 12.7 (L) 13.0 - 17.0 g/dL   HCT 36.7 (L) 39.0 - 52.0 %   MCV 87.2 80.0 - 100.0 fL   MCH 30.2 26.0 - 34.0 pg   MCHC 34.6 30.0 - 36.0 g/dL   RDW 15.6 (H) 11.5 - 15.5 %   Platelets 143 (L) 150 - 400 K/uL   nRBC 0.0 0.0 - 0.2 %    Comment: Performed at [facility], [address] [place], [place] [address]  Glucose, capillary     Status: Abnormal   Collection Time: 12/29/20  7:59 AM  Result Value Ref Range   Glucose-Capillary 68 (L) 70 - 99 mg/dL    Comment: Glucose reference range applies only to samples taken after fasting for at least 8 hours.    US  Venous Img Lower Bilateral  Result Date: 12/28/2020 CLINICAL DATA:  Leg swelling for 1 week. Patient has a history of lung malignancy and prior DVT. EXAM: BILATERAL LOWER EXTREMITY VENOUS DOPPLER ULTRASOUND TECHNIQUE: Gray-scale sonography with graded compression, as well as color Doppler and duplex ultrasound were performed to evaluate the lower extremity deep venous systems from the level of the common femoral vein and including the common femoral, femoral, profunda femoral, popliteal and calf veins including the posterior tibial, peroneal and gastrocnemius veins when visible. The superficial great saphenous vein was also interrogated. Spectral Doppler was utilized to evaluate flow at rest and with distal augmentation maneuvers in the common femoral, femoral and popliteal veins. COMPARISON:  Lower extremity ultrasound dated 06/03/2016. FINDINGS: RIGHT LOWER EXTREMITY Common Femoral Vein: No evidence of thrombus. Normal compressibility, respiratory phasicity and response to augmentation. Saphenofemoral Junction: No evidence of thrombus. Normal compressibility and flow on color Doppler imaging. Profunda Femoral Vein: No evidence of thrombus. Normal compressibility and flow on  color Doppler imaging. Femoral Vein: No evidence of thrombus. Normal compressibility, respiratory phasicity and response to augmentation. Popliteal Vein: No evidence of thrombus. Normal compressibility, respiratory phasicity and response to augmentation. Calf Veins: Thrombus is noted in the posterior tibial vein. Superficial Great Saphenous Vein: No evidence of thrombus. Normal compressibility. Venous Reflux:  None. Other Findings:  None. LEFT LOWER EXTREMITY Common Femoral Vein: No evidence of thrombus. Normal compressibility, respiratory phasicity and response to augmentation. Saphenofemoral Junction: No evidence of thrombus. Normal compressibility and flow on color Doppler imaging. Profunda Femoral Vein: No evidence of thrombus. Normal  compressibility and flow on color Doppler imaging. Femoral Vein: No evidence of thrombus. Normal compressibility, respiratory phasicity and response to augmentation. Popliteal Vein: No evidence of thrombus. Normal compressibility, respiratory phasicity and response to augmentation. Calf Veins: Thrombus is noted in the posterior tibial vein. Superficial Great Saphenous Vein: No evidence of thrombus. Normal compressibility. Venous Reflux:  None. Other Findings:  None. IMPRESSION: Occlusive thrombus in the bilateral posterior tibial veins. Electronically Signed   By: Zerita Boers M.D.   On: 12/28/2020 18:57   DG Chest Portable 1 View  Result Date: 12/28/2020 CLINICAL DATA:  Shortness of breath. EXAM: PORTABLE CHEST 1 VIEW COMPARISON:  August 08, 2018 FINDINGS: Post treatment changes are seen in the left upper lobe. The heart, hila, mediastinum, lungs, and pleura are otherwise unremarkable. IMPRESSION: No acute interval change. Electronically Signed   By: Dorise Bullion III M.D   On: 12/28/2020 17:51    Review of Systems  Unable to perform ROS: Mental status change   Blood pressure 98/63, pulse 60, temperature 97.8 F (36.6 C), resp. rate 16, height '5\' 4"'  (1.626 m), weight 72.5 kg, SpO2 96 %. Physical Exam Vitals and nursing note reviewed.  Constitutional:      General: He is not in acute distress.    Comments: Not alert.  Somnolent.  Will awaken to verbal.  Oriented only to self  HENT:     Head: Normocephalic and atraumatic.     Mouth/Throat:     Mouth: Mucous membranes are moist.  Cardiovascular:     Rate and Rhythm: Normal rate and regular rhythm.     Heart sounds: Normal heart sounds.  Pulmonary:     Effort: Pulmonary effort is normal.     Breath sounds: Normal breath sounds.  Abdominal:     General: Abdomen is flat.     Tenderness: There is no abdominal tenderness. There is no guarding.  Musculoskeletal:        General: Swelling present. No signs of injury.     Cervical back: Neck  supple. No tenderness.  Skin:    General: Skin is warm and dry.     Capillary Refill: Capillary refill takes less than 2 seconds.  Neurological:     Mental Status: He is disoriented.     Motor: Weakness present.     Assessment/Plan: DVT involving bilateral PT veins.  Not an candidate for anticoagulation because of brain met hemorrhagic conversion in March.  IVC Filter appropriate, especially given hypercoaguable state and history of prior PE.  Have attempted to contact wife for consent, unavailable by phone at the moment.    Dwana Curd 12/29/2020, 8:12 AM

## 2020-12-29 NOTE — Procedures (Addendum)
12/29/2020  11:54 AM  PATIENT:  Melvin Willis  70 y.o. male  PRE-OPERATIVE DIAGNOSIS:  IVC filter insertion  POST-OPERATIVE DIAGNOSIS:  * No post-op diagnosis entered *  PROCEDURE:  Procedure(s): IVC FILTER INSERTION (N/A)  SURGEON:  Surgeon(s) and Role:    * Dwana Curd, MD - Primary  PHYSICIAN ASSISTANT: None  ASSISTANTS: none   ANESTHESIA:   local  EBL:  none   Findings:  Vena cava widely patent on Venogram, as well as bilateral iliac veins.  Roadmapping done.  Completion venogram done showing filter in good position.  BLOOD ADMINISTERED:none  DRAINS: none   LOCAL MEDICATIONS USED:  NONE  SPECIMEN:  No Specimen  DISPOSITION OF SPECIMEN:  N/A  COUNTS:  n/a   DICTATION: .  Patient was brought to vascular interventional radiology placed supine upon the table.  The right groin was shaved and prepped and draped in a standard fashion.  Timeout was performed.  Using ultrasound the right common femoral vein was identified.  The skin and subcutaneous tissues were anesthetized with 8 cc 1% lidocaine without epinephrine.  Under ultrasound guidance a micropuncture needle was inserted into the right common femoral vein and a 018 wire placed.  Over this the sheath was advanced.  A 0.035 wire was then advanced and the sheath was upsized to a 5 [place] and flushed with heparinized saline.  A Bentson wire was then advanced up into the vena cava and a 5 French pigtail catheter was advanced to the origin of the cava.  A venogram was then performed.  Roadmapping was performed showing the iliac vein bifurcation as well as the bilateral renal veins.  The sheath was then exchanged to the [facility] IVC filter sheath which was advanced into the vena cava.  The filter was then advanced and then deployed in the midportion of the IVC below the renals and above the bifurcation.  Completion venogram was performed.  The sheath was removed and pressure was held for 3 minutes.  Sterile dressing was  applied.  Patient tolerated procedure well.

## 2020-12-30 ENCOUNTER — Encounter: Payer: Self-pay | Admitting: Vascular Surgery

## 2020-12-30 ENCOUNTER — Other Ambulatory Visit: Payer: Self-pay | Admitting: Urology

## 2020-12-30 ENCOUNTER — Ambulatory Visit: Payer: Medicare Other | Admitting: Family Medicine

## 2020-12-30 DIAGNOSIS — N138 Other obstructive and reflux uropathy: Secondary | ICD-10-CM

## 2020-12-30 DIAGNOSIS — N390 Urinary tract infection, site not specified: Principal | ICD-10-CM

## 2020-12-30 DIAGNOSIS — I82443 Acute embolism and thrombosis of tibial vein, bilateral: Secondary | ICD-10-CM | POA: Diagnosis not present

## 2020-12-30 DIAGNOSIS — T83511A Infection and inflammatory reaction due to indwelling urethral catheter, initial encounter: Secondary | ICD-10-CM

## 2020-12-30 LAB — CBC
HCT: 39.4 % (ref 39.0–52.0)
Hemoglobin: 13.4 g/dL (ref 13.0–17.0)
MCH: 30 pg (ref 26.0–34.0)
MCHC: 34 g/dL (ref 30.0–36.0)
MCV: 88.1 fL (ref 80.0–100.0)
Platelets: 148 10*3/uL — ABNORMAL LOW (ref 150–400)
RBC: 4.47 MIL/uL (ref 4.22–5.81)
RDW: 15.1 % (ref 11.5–15.5)
WBC: 6.4 10*3/uL (ref 4.0–10.5)
nRBC: 0 % (ref 0.0–0.2)

## 2020-12-30 LAB — COMPREHENSIVE METABOLIC PANEL
ALT: 27 U/L (ref 0–44)
AST: 17 U/L (ref 15–41)
Albumin: 2.8 g/dL — ABNORMAL LOW (ref 3.5–5.0)
Alkaline Phosphatase: 55 U/L (ref 38–126)
Anion gap: 7 (ref 5–15)
BUN: 14 mg/dL (ref 8–23)
CO2: 24 mmol/L (ref 22–32)
Calcium: 9.5 mg/dL (ref 8.9–10.3)
Chloride: 106 mmol/L (ref 98–111)
Creatinine, Ser: 1.21 mg/dL (ref 0.61–1.24)
GFR, Estimated: >60 mL/min (ref >60)
Glucose, Bld: 209 mg/dL — ABNORMAL HIGH (ref 70–99)
Potassium: 5 mmol/L (ref 3.5–5.1)
Sodium: 137 mmol/L (ref 135–145)
Total Bilirubin: 0.6 mg/dL (ref 0.3–1.2)
Total Protein: 6.2 g/dL — ABNORMAL LOW (ref 6.5–8.1)

## 2020-12-30 LAB — GLUCOSE, CAPILLARY
Glucose-Capillary: 179 mg/dL — ABNORMAL HIGH (ref 70–99)
Glucose-Capillary: 160 mg/dL — ABNORMAL HIGH (ref 70–99)
Glucose-Capillary: 226 mg/dL — ABNORMAL HIGH (ref 70–99)
Glucose-Capillary: 253 mg/dL — ABNORMAL HIGH (ref 70–99)

## 2020-12-30 LAB — CULTURE, URINE COMPREHENSIVE

## 2020-12-30 LAB — AMMONIA: Ammonia: 16 umol/L (ref 9–35)

## 2020-12-30 MED ORDER — LORAZEPAM 2 MG/ML IJ SOLN
0.5000 mg | Freq: Once | INTRAMUSCULAR | Status: AC
  Administered 2020-12-30: 0.5 mg via INTRAVENOUS

## 2020-12-30 MED ORDER — PIPERACILLIN-TAZOBACTAM 3.375 G IVPB
3.3750 g | Freq: Three times a day (TID) | INTRAVENOUS | Status: DC
  Administered 2020-12-30 – 2020-12-31 (×4): 3.375 g via INTRAVENOUS
  Filled 2020-12-30 (×3): qty 50

## 2020-12-30 MED ORDER — MELATONIN 5 MG PO TABS
5.0000 mg | ORAL_TABLET | Freq: Every day | ORAL | Status: DC
  Administered 2020-12-30 – 2020-12-31 (×2): 5 mg via ORAL
  Filled 2020-12-30 (×3): qty 1

## 2020-12-30 MED ORDER — HEPARIN SODIUM (PORCINE) 5000 UNIT/ML IJ SOLN
5000.0000 [IU] | Freq: Three times a day (TID) | INTRAMUSCULAR | Status: DC
  Administered 2020-12-30 – 2020-12-31 (×3): 5000 [IU] via SUBCUTANEOUS
  Filled 2020-12-30 (×2): qty 1

## 2020-12-30 MED ORDER — LORAZEPAM 2 MG/ML IJ SOLN
INTRAMUSCULAR | Status: AC
  Filled 2020-12-30: qty 1

## 2020-12-30 NOTE — Plan of Care (Deleted)
Patient is being discharged back to [facility]. IV removed. CC monitoring removed./ Report called. Waiting for EMS/Transport

## 2020-12-30 NOTE — Progress Notes (Signed)
Messaged MD concerning discontinue of cardiac monitoring. Patient agitatd and pulling off leads. MD gave verbal d/c order

## 2020-12-30 NOTE — Progress Notes (Signed)
[facility] Daily Progress Note  Subjective: 12/29/20: IVC Filter Insertion  Patient without complaint this AM.  No acute issues overnight.  Objective: Vitals:   12/30/20 0433 12/30/20 0754 12/30/20 0800 12/30/20 1131  BP: 133/79 140/76 139/78 123/84  Pulse: (!) 55 60 (!) 53 60  Resp: 16 14 18 16   Temp: 98.2 F (36.8 C) 98.4 F (36.9 C) 97.6 F (36.4 C) 98 F (36.7 C)  TempSrc: Oral  Oral   SpO2: 96% 98% 97% 96%  Weight:      Height:        Intake/Output Summary (Last 24 hours) at 12/30/2020 1211 Last data filed at 12/30/2020 0800 Gross per 24 hour  Intake 876.4 ml  Output 1750 ml  Net -873.6 ml   Physical Exam: A&Ox3, NAD CV: RRR Pulmonary: CTA Bilaterally Abdomen: Soft, Nontender, Nondistended Right groin:  Access site: Clean, Dry and Intact. Vascular:  Extremities are warm distally toes   Laboratory: CBC    Component Value Date/Time   WBC 6.4 12/30/2020 0444   HGB 13.4 12/30/2020 0444   HGB 14.1 01/01/2016 1000   HCT 39.4 12/30/2020 0444   HCT 42.7 01/01/2016 1000   PLT 148 (L) 12/30/2020 0444   PLT 307 01/01/2016 1000   BMET    Component Value Date/Time   NA 137 12/30/2020 0444   NA 139 01/01/2016 1000   K 5.0 12/30/2020 0444   CL 106 12/30/2020 0444   CO2 24 12/30/2020 0444   GLUCOSE 209 (H) 12/30/2020 0444   BUN 14 12/30/2020 0444   BUN 13 01/01/2016 1000   CREATININE 1.21 12/30/2020 0444   CREATININE 1.58 (H) 07/26/2020 1014   CALCIUM 9.5 12/30/2020 0444   GFRNONAA >60 12/30/2020 0444   GFRNONAA 45 (L) 07/26/2020 1014   GFRAA 52 (L) 07/26/2020 1014   Assessment/Planning: The patient is a 70 year old male with known history of stage IV lung cancer with brain metastases and DVT / PE was initially on xarelto which had to be discontinued due to intracranial bleeding status post IVC filter insertion - POD#1  1) Right groin access site clean dry and intact. No swelling or drainage noted. 2) No further recommendations from  vascular surgery at this time. 3) We are happy to follow the patient in the outpatient setting to surveilled his bilateral DVT. 4) Vascular surgery to sign off.  Discussed with Dr. Ellis Parents Kavita Bartl PA-C 12/30/2020 12:11 PM

## 2020-12-30 NOTE — Progress Notes (Signed)
Melvin Willis   DOB:1951-05-17   ST#:[id number]    Subjective: Patient still intermittently confused.  Denies any fevers or chills.  No nausea vomiting.  Feels weak.  Objective:  Vitals:   12/30/20 1131 12/30/20 1642  BP: 123/84 121/71  Pulse: 60 63  Resp: 16 16  Temp: 98 F (36.7 C) 98.1 F (36.7 C)  SpO2: 96% 96%     Intake/Output Summary (Last 24 hours) at 12/30/2020 1823 Last data filed at 12/30/2020 1351 Gross per 24 hour  Intake 876.4 ml  Output 875 ml  Net 1.4 ml    Physical Exam Constitutional:      Comments: Resting of the bed no acute distress.  Alone.  HENT:     Head: Normocephalic and atraumatic.     Mouth/Throat:     Pharynx: No oropharyngeal exudate.  Eyes:     Pupils: Pupils are equal, round, and reactive to light.  Cardiovascular:     Rate and Rhythm: Normal rate and regular rhythm.  Pulmonary:     Effort: No respiratory distress.     Breath sounds: No wheezing.     Comments: Decreased air entry bilaterally.  No wheeze or crackles Abdominal:     General: Bowel sounds are normal. There is no distension.     Palpations: Abdomen is soft. There is no mass.     Tenderness: There is no abdominal tenderness. There is no guarding or rebound.  Genitourinary:    Comments: Foley cath in place.  Musculoskeletal:        General: No tenderness. Normal range of motion.     Cervical back: Normal range of motion and neck supple.  Skin:    General: Skin is warm.  Neurological:     Mental Status: He is alert.     Comments: Oriented x2-3  Psychiatric:        Mood and Affect: Affect normal.      Labs:  Lab Results  Component Value Date   WBC 6.4 12/30/2020   HGB 13.4 12/30/2020   HCT 39.4 12/30/2020   MCV 88.1 12/30/2020   PLT 148 (L) 12/30/2020   NEUTROABS 4.7 12/28/2020    Lab Results  Component Value Date   NA 137 12/30/2020   K 5.0 12/30/2020   CL 106 12/30/2020   CO2 24 12/30/2020    Studies:  MR BRAIN W WO CONTRAST  Result Date:  12/29/2020 CLINICAL DATA:  Lung cancer with brain metastases. Status post whole brain radiation. EXAM: MRI HEAD WITHOUT AND WITH CONTRAST TECHNIQUE: Multiplanar, multiecho pulse sequences of the brain and surrounding structures were obtained without and with intravenous contrast. CONTRAST:  24m GADAVIST GADOBUTROL 1 MMOL/ML IV SOLN COMPARISON:  11/14/2020 FINDINGS: Brain: The enhancing lesion anteriorly in the left temporal lobe has decreased in size, now measuring 14 mm (series 19, image 58, previously 19 mm) with decreased edema. A 3 mm focus of enhancement slightly more superiorly and laterally in the left temporal lobe (series 19, image 70), a 6 mm enhancing lesion in the anterior midline of the midbrain (series 19, image 76), and an 11 mm enhancing lesion in the anterior left frontal lobe (series 19, image 82) are all unchanged. No new enhancing brain lesion is identified. Multiple areas of chronic hemorrhage are again noted in both cerebral hemispheres including chronic hemorrhage associated with the left temporal and left frontal lesions as well as hemosiderin stained encephalomalacia in the parieto-occipital regions bilaterally. There are unchanged small chronic right frontal infarcts. Outside  of the left temporal lobe, confluent T2 hyperintensities in the cerebral white matter bilaterally are stable to minimally increased and reflect in part post radiation changes. No midline shift, acute infarct, or extra-axial fluid collection is identified. 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 There is moderate cerebral atrophy. Vascular: Major intracranial vascular flow voids are preserved. Skull and upper cervical spine: No suspicious marrow lesion. Sinuses/Orbits: Unremarkable orbits. Left maxillary sinus mucous retention cyst. Persistent large right mastoid effusion. Other: None. IMPRESSION: 1. Decreased size of left temporal lobe metastasis with decreased edema. 2. Unchanged size of other lesions. 3.  No evidence of new intracranial metastases or acute abnormality. Electronically Signed   By: Logan Bores M.D.   On: 12/29/2020 17:38   US Venous Img Lower Bilateral  Result Date: 12/28/2020 CLINICAL DATA:  Leg swelling for 1 week. Patient has a history of lung malignancy and prior DVT. EXAM: BILATERAL LOWER EXTREMITY VENOUS DOPPLER ULTRASOUND TECHNIQUE: Gray-scale sonography with graded compression, as well as color Doppler and duplex ultrasound were performed to evaluate the lower extremity deep venous systems from the level of the common femoral vein and including the common femoral, femoral, profunda femoral, popliteal and calf veins including the posterior tibial, peroneal and gastrocnemius veins when visible. The superficial great saphenous vein was also interrogated. Spectral Doppler was utilized to evaluate flow at rest and with distal augmentation maneuvers in the common femoral, femoral and popliteal veins. COMPARISON:  Lower extremity ultrasound dated 06/03/2016. FINDINGS: RIGHT LOWER EXTREMITY Common Femoral Vein: No evidence of thrombus. Normal compressibility, respiratory phasicity and response to augmentation. Saphenofemoral Junction: No evidence of thrombus. Normal compressibility and flow on color Doppler imaging. Profunda Femoral Vein: No evidence of thrombus. Normal compressibility and flow on color Doppler imaging. Femoral Vein: No evidence of thrombus. Normal compressibility, respiratory phasicity and response to augmentation. Popliteal Vein: No evidence of thrombus. Normal compressibility, respiratory phasicity and response to augmentation. Calf Veins: Thrombus is noted in the posterior tibial vein. Superficial Great Saphenous Vein: No evidence of thrombus. Normal compressibility. Venous Reflux:  None. Other Findings:  None. LEFT LOWER EXTREMITY Common Femoral Vein: No evidence of thrombus. Normal compressibility, respiratory phasicity and response to augmentation. Saphenofemoral Junction:  No evidence of thrombus. Normal compressibility and flow on color Doppler imaging. Profunda Femoral Vein: No evidence of thrombus. Normal compressibility and flow on color Doppler imaging. Femoral Vein: No evidence of thrombus. Normal compressibility, respiratory phasicity and response to augmentation. Popliteal Vein: No evidence of thrombus. Normal compressibility, respiratory phasicity and response to augmentation. Calf Veins: Thrombus is noted in the posterior tibial vein. Superficial Great Saphenous Vein: No evidence of thrombus. Normal compressibility. Venous Reflux:  None. Other Findings:  None. IMPRESSION: Occlusive thrombus in the bilateral posterior tibial veins. Electronically Signed   By: Zerita Boers M.D.   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  Currently hold Lorlatinib-in the hospital for acute issues.  #Brain metastases s/p whole brain radiation [finished March 2022]-reviewed brain MRI this admission-partial response; no worsening disease or worsening hemorrhagic disease. 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  If tolerating well will advance further; however will need to stop prior to HOLEP.   #Urinary retention s/p  Foley catheter; UTI-on broad-spectrum antibiotics as per primary team.  Discussed with Dr.Sinikski-plan to reschedule HOLEP.  #The above plan of care was discussed with the patient's wife Lattie Haw at length.   Cc; Dr.Sowles.   Cammie Sickle, MD 12/30/2020  6:23 PM

## 2020-12-30 NOTE — Plan of Care (Signed)
Pt vitals stable. Remains confused pulling of gown and telemetry multiple times during overnight and this am pulled off telemetry and IV. IV replaced. Groin site clean dry and intact no signs of hematoma  Problem: Education: Goal: Knowledge of General Education information will improve Description: Including pain rating scale, medication(s)/side effects and non-pharmacologic comfort measures Outcome: Progressing   Problem: Health Behavior/Discharge Planning: Goal: Ability to manage health-related needs will improve Outcome: Progressing   Problem: Clinical Measurements: Goal: Ability to maintain clinical measurements within normal limits will improve Outcome: Progressing Goal: Will remain free from infection Outcome: Progressing Goal: Diagnostic test results will improve Outcome: Progressing Goal: Respiratory complications will improve Outcome: Progressing Goal: Cardiovascular complication will be avoided Outcome: Progressing   Problem: Activity: Goal: Risk for activity intolerance will decrease Outcome: Progressing   Problem: Nutrition: Goal: Adequate nutrition will be maintained Outcome: Progressing   Problem: Elimination: Goal: Will not experience complications related to bowel motility Outcome: Progressing Goal: Will not experience complications related to urinary retention Outcome: Progressing   Problem: Pain Managment: Goal: General experience of comfort will improve Outcome: Progressing   Problem: Safety: Goal: Ability to remain free from injury will improve Outcome: Progressing   Problem: Skin Integrity: Goal: Risk for impaired skin integrity will decrease Outcome: Progressing

## 2020-12-30 NOTE — Progress Notes (Signed)
PROGRESS NOTE                                                                             PROGRESS NOTE                                                                                                                                                                                                             Patient Demographics:    Melvin Willis, is a 70 y.o. male, DOB - 09-19-1950, QPY:[id number]  Outpatient Primary MD for the patient is [name], MD    LOS - 2  Admit date - 12/28/2020    Chief Complaint  Patient presents with  . Urinary Retention  . Weakness       Brief Narrative    Melvin Willis is a 70 y.o. 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 Pt was started on ceftriaxone.  Venous Doppler ultrasound was positive for occlusive thrombus of bilateral lower posterior tibial veins.  Patient not on full anticoagulation due to known brain diastasis with hemorrhage, seen by vascular surgery, IVC filter inserted 4/24.   Subjective:    Melvin Willis today no significant events overnight, he is more appropriate and coherent this morning.     Assessment  & Plan :    Principal Problem:   UTI (urinary tract infection) Active Problems:   Type 2 diabetes mellitus with stage 3 chronic kidney disease (HCC)   Dyslipidemia   Primary malignant neoplasm of lung metastatic to other site [facility])   Brain metastasis ([place])   Acute-on-chronic kidney injury ([place])   Acute urinary retention   Leg DVT (deep venous thromboembolism), acute, bilateral (HCC)  UTI w/ chronic urinary  retention -pt has hx of  BPH and had urinary retention with Foley catheter placed by urology on 4/7.  He has planned HOLEP procedure on 4/29.  - Foley changed out in ED with good flow -Urine culture growing Enterococcus and Pseudomonas, will change Rocephin to Zosyn, will follow on final sensitivities stabilities -Continue tamsulosin and finasteride  Bilateral lower extremity occlusive thrombus of the posterior tibial veins -Has history of PE and bilateral lower extremity DVT.  Previously on Xarelto but has been held by oncology due to recent hemorrhagic metastasis. -Very difficult situation, with known history of PE, and apparently with worsening lower extremity DVT, who is high risk for full anticoagulation given known recent hemorrhagic brain metastasis discussed with oncology, and it is appropriate here to put IVC filter to a lessnen clot  burden on the lungs. -Vascular surgery consulted, IVC filter placed/24. -DVT prophylaxis dose subcu heparin, as MRI brain showing worsening metastasis or chronic hemorrhage.    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 Rogue Bussing, consult appreciated -Continue Keppra -MRI brain with/without contrast does not show any evidence of worsening brain metastasis or hemorrhage.   Acute metabolic encephalopathy  -Most likely due to infectious process, UTI with underlining brain metastasis.   -improving  Acute on chronic CKD stage III Creatinine of 1.52 from prior 1.09 Improving with IV fluids  Type 2 diabetes with hyperglycemia - sensitive SSI  - Continue Gabapentin   Hyperlipidemia -Continue statin    SpO2: 96 % O2 Flow Rate (L/min): 2 L/min  Recent Labs  Lab 12/27/20 1352 12/28/20 1702 12/28/20 2058 12/29/20 0436 12/30/20 0444  WBC 6.8 7.9  --  6.1 6.4  PLT 142* 166  --  143* 148*  BNP  --  38.3  --   --   --   AST 21 19  --   --  17  ALT 41 38  --   --  27  ALKPHOS  65 67  --   --  55  BILITOT 0.5 0.7  --   --  0.6  ALBUMIN 3.3* 3.7  --   --  2.8*  LATICACIDVEN  --  2.7* 2.0*  --   --   SARSCOV2NAA  --  NEGATIVE  --   --   --        ABG  No results found for: PHART, PCO2ART, PO2ART, HCO3, TCO2, ACIDBASEDEF, O2SAT        Condition - Extremely Guarded  Family Communication  :  D/W wife by phone.  Code Status :  Full  Consults  :  Medical oncology, vascular surgery  Procedures  :  IVC filter insertion    Status is: Inpatient  Remains inpatient appropriate because:Ongoing diagnostic testing needed not appropriate for outpatient work up, Unsafe d/c plan and IV treatments appropriate due to intensity of illness or inability to take PO   Dispo: The patient is from: Home              Anticipated d/c is to: Home              Patient currently is not medically stable to d/c.   Difficult to place patient No      DVT Prophylaxis  : SCDs given recent bilateral DVT, will start on subcu heparin given MRI brain with no worsening metastasis or bleed as discussed with primary oncologist. Lab Results  Component Value Date   PLT 148 (L) 12/30/2020    Diet :  Diet Order            Diet regular Room service appropriate? Yes; Fluid consistency: Thin  Diet effective now                  Inpatient Medications  Scheduled Meds: . atorvastatin  40 mg Oral Daily  . Chlorhexidine Gluconate Cloth  6 each Topical Daily  . finasteride  5 mg Oral Daily  . gabapentin  300 mg Oral QHS  . insulin aspart  0-5 Units Subcutaneous QHS  . insulin aspart  0-9 Units Subcutaneous TID WC  . lorlatinib  100 mg Oral Daily  . magic mouthwash  5 mL Oral QID  . tamsulosin  0.4 mg Oral q AM   Continuous Infusions: . sodium chloride 50 mL/hr at 12/30/20 0751  . levETIRAcetam 500 mg (12/30/20 0912)   PRN Meds:.  Antibiotics  :    Anti-infectives (From admission, onward)   Start     Dose/Rate Route Frequency Ordered Stop   12/29/20 1800  cefTRIAXone  (ROCEPHIN) 1 g in sodium chloride 0.9 % 100 mL IVPB  Status:  Discontinued        1 g 200 mL/hr over 30 Minutes Intravenous Every 24 hours 12/28/20 2111 12/30/20 1315   12/28/20 1900  cefTRIAXone (ROCEPHIN) 1 g in sodium chloride 0.9 % 100 mL IVPB        1 g 200 mL/hr over 30 Minutes Intravenous  Once 12/28/20 1848 12/28/20 2122       Phillips Climes M.D on 12/30/2020 at 1:18 PM  To page go to [email or web address]  [facility] -  Office  [phone]      Objective:   Vitals:   12/30/20 0433 12/30/20 0754 12/30/20 0800 12/30/20 1131  BP: 133/79 140/76 139/78 123/84  Pulse: (!) 55 60 (!) 53 60  Resp: 16 14 18 16   Temp: 98.2 F (36.8 C) 98.4 F (36.9 C) 97.6 F (36.4 C) 98 F (36.7 C)  TempSrc: Oral  Oral   SpO2: 96% 98% 97% 96%  Weight:      Height:        Wt Readings from Last 3 Encounters:  12/28/20 72.5 kg  12/27/20 73.2 kg  12/13/20 71.2 kg     Intake/Output Summary (Last 24 hours) at 12/30/2020 1318 Last data filed at 12/30/2020 0800 Gross per 24 hour  Intake 876.4 ml  Output 1750 ml  Net -873.6 ml     Physical Exam  Awake Alert, Oriented X (2), he is much more awake and appropriate today. Symmetrical Chest wall movement, Good air movement bilaterally, CTAB RRR,No Gallops,Rubs or new Murmurs, No Parasternal Heave +ve B.Sounds, Abd Soft, No tenderness, No rebound - guarding or rigidity. No Cyanosis, Clubbing +1 edema, No new Rash or bruise       Data Review:    CBC Recent Labs  Lab 12/27/20 1352 12/28/20 1702 12/29/20 0436 12/30/20 0444  WBC 6.8 7.9 6.1 6.4  HGB 14.1 14.1 12.7* 13.4  HCT 41.5 42.0 36.7* 39.4  PLT 142* 166 143* 148*  MCV 89.2 88.8 87.2 88.1  MCH 30.3 29.8 30.2 30.0  MCHC 34.0 33.6 34.6 34.0  RDW 15.4 15.4 15.6* 15.1  LYMPHSABS 1.9 2.2  --   --   MONOABS 0.7 0.8  --   --   EOSABS 0.1 0.0  --   --   BASOSABS 0.0 0.0  --   --     Recent Labs  Lab 12/27/20 1352 12/28/20 1702  12/28/20 2058 12/29/20 0436  12/30/20 0444  NA 137 132*  --  139 137  K 4.1 4.1  --  3.5 5.0  CL 103 101  --  109 106  CO2 24 20*  --  24 24  GLUCOSE 325* 368*  --  101* 209*  BUN 13 17  --  12 14  CREATININE 1.09 1.52*  --  1.16 1.21  CALCIUM 9.5 10.1  --  9.3 9.5  AST 21 19  --   --  17  ALT 41 38  --   --  27  ALKPHOS 65 67  --   --  55  BILITOT 0.5 0.7  --   --  0.6  ALBUMIN 3.3* 3.7  --   --  2.8*  LATICACIDVEN  --  2.7* 2.0*  --   --   HGBA1C  --  9.5*  --   --   --   AMMONIA  --   --   --   --  16  BNP  --  38.3  --   --   --     ------------------------------------------------------------------------------------------------------------------ No results for input(s): CHOL, HDL, LDLCALC, TRIG, CHOLHDL, LDLDIRECT in the last 72 hours.  Lab Results  Component Value Date   HGBA1C 9.5 (H) 12/28/2020   ------------------------------------------------------------------------------------------------------------------ No results for input(s): TSH, T4TOTAL, T3FREE, THYROIDAB in the last 72 hours.  Invalid input(s): FREET3  Cardiac Enzymes No results for input(s): CKMB, TROPONINI, MYOGLOBIN in the last 168 hours.  Invalid input(s): CK ------------------------------------------------------------------------------------------------------------------    Component Value Date/Time   BNP 38.3 12/28/2020 1702    Micro Results Recent Results (from the past 240 hour(s))  CULTURE, URINE COMPREHENSIVE     Status: Abnormal (Preliminary result)   Collection Time: 12/24/20 11:04 AM   Specimen: Urine   UR  Result Value Ref Range Status   Urine Culture, Comprehensive Preliminary report (A)  Preliminary   Organism ID, Bacteria Comment (A)  Preliminary    Comment: Vancomycin-resistant Enterococcus (Enterococcus faecalis) Greater than 100,000 colony forming units per mL   Microscopic Examination     Status: Abnormal   Collection Time: 12/24/20 11:04 AM   Urine  Result Value Ref Range Status   WBC, UA 6-10 (A)  0 - 5 /hpf Final   RBC 3-10 (A) 0 - 2 /hpf Final   Epithelial Cells (non renal) 0-10 0 - 10 /hpf Final   Crystals Present (A) N/A Final   Crystal Type Amorphous Sediment N/A Final   Bacteria, UA Few None seen/Few Final  Urine culture     Status: Abnormal (Preliminary result)   Collection Time: 12/28/20  5:01 PM   Specimen: Urine, Random  Result Value Ref Range Status   Specimen Description   Final    URINE, RANDOM Performed at [facility], [address], [place], [place] [address]    Special Requests   Final    NONE Performed at [facility], [address] [place], [place] [address]    Culture (A)  Final    >=100,000 COLONIES/mL PSEUDOMONAS AERUGINOSA >=100,000 COLONIES/mL ENTEROCOCCUS FAECALIS    Report Status PENDING  Incomplete  Resp Panel by RT-PCR (Flu A&B, Covid) Nasopharyngeal Swab     Status: None   Collection Time: 12/28/20  5:02 PM   Specimen: Nasopharyngeal Swab; Nasopharyngeal(NP) swabs in vial transport medium  Result Value Ref Range Status   SARS Coronavirus 2 by RT PCR NEGATIVE NEGATIVE Final    Comment: (NOTE) SARS-CoV-2  target nucleic acids are NOT DETECTED.  The SARS-CoV-2 RNA is generally detectable in upper respiratory specimens during the acute phase of infection. The lowest concentration of SARS-CoV-2 viral copies this assay can detect is 138 copies/mL. A negative result does not preclude SARS-Cov-2 infection and should not be used as the sole basis for treatment or other patient management decisions. A negative result may occur with  improper specimen collection/handling, submission of specimen other than nasopharyngeal swab, presence of viral mutation(s) within the areas targeted by this assay, and inadequate number of viral copies(<138 copies/mL). A negative result must be combined with clinical observations, patient history, and epidemiological information. The expected result is Negative.  Fact Sheet for Patients:   EntrepreneurPulse.com.au  Fact Sheet for Healthcare Providers:  IncredibleEmployment.be  This test is no t yet approved or cleared by the [place] FDA and  has been authorized for detection and/or diagnosis of SARS-CoV-2 by FDA under an Emergency Use Authorization (EUA). This EUA will remain  in effect (meaning this test can be used) for the duration of the COVID-19 declaration under Section 564(b)(1) of the Act, 21 U.S.C.section 360bbb-3(b)(1), unless the authorization is terminated  or revoked sooner.       Influenza A by PCR NEGATIVE NEGATIVE Final   Influenza B by PCR NEGATIVE NEGATIVE Final    Comment: (NOTE) The Xpert Xpress SARS-CoV-2/FLU/RSV plus assay is intended as an aid in the diagnosis of influenza from Nasopharyngeal swab specimens and should not be used as a sole basis for treatment. Nasal washings and aspirates are unacceptable for Xpert Xpress SARS-CoV-2/FLU/RSV testing.  Fact Sheet for Patients: EntrepreneurPulse.com.au  Fact Sheet for Healthcare Providers: IncredibleEmployment.be  This test is not yet approved or cleared by the [place] FDA and has been authorized for detection and/or diagnosis of SARS-CoV-2 by FDA under an Emergency Use Authorization (EUA). This EUA will remain in effect (meaning this test can be used) for the duration of the COVID-19 declaration under Section 564(b)(1) of the Act, 21 U.S.C. section 360bbb-3(b)(1), unless the authorization is terminated or revoked.  Performed at [facility], [address] [place], [place] [address]     Radiology Reports MR BRAIN W WO CONTRAST  Result Date: 12/29/2020 CLINICAL DATA:  Lung cancer with brain metastases. Status post whole brain radiation. EXAM: MRI HEAD WITHOUT AND WITH CONTRAST TECHNIQUE: Multiplanar, multiecho pulse sequences of the brain and surrounding structures were obtained without and  with intravenous contrast. CONTRAST:  45mL GADAVIST GADOBUTROL 1 MMOL/ML IV SOLN COMPARISON:  11/14/2020 FINDINGS: Brain: The enhancing lesion anteriorly in the left temporal lobe has decreased in size, now measuring 14 mm (series 19, image 58, previously 19 mm) with decreased edema. A 3 mm focus of enhancement slightly more superiorly and laterally in the left temporal lobe (series 19, image 70), a 6 mm enhancing lesion in the anterior midline of the midbrain (series 19, image 76), and an 11 mm enhancing lesion in the anterior left frontal lobe (series 19, image 82) are all unchanged. No new enhancing brain lesion is identified. Multiple areas of chronic hemorrhage are again noted in both cerebral hemispheres including chronic hemorrhage associated with the left temporal and left frontal lesions as well as hemosiderin stained encephalomalacia in the parieto-occipital regions bilaterally. There are unchanged small chronic right frontal infarcts. Outside of the left temporal lobe, confluent T2 hyperintensities in the cerebral white matter bilaterally are stable to minimally increased and reflect in part post radiation changes. No midline shift, acute infarct, or extra-axial  fluid collection is identified. 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 There is moderate cerebral atrophy. Vascular: Major intracranial vascular flow voids are preserved. Skull and upper cervical spine: No suspicious marrow lesion. Sinuses/Orbits: Unremarkable orbits. Left maxillary sinus mucous retention cyst. Persistent large right mastoid effusion. Other: None. IMPRESSION: 1. Decreased size of left temporal lobe metastasis with decreased edema. 2. Unchanged size of other lesions. 3. No evidence of new intracranial metastases or acute abnormality. Electronically Signed   By: Logan Bores M.D.   On: 12/29/2020 17:38   US Venous Img Lower Bilateral  Result Date: 12/28/2020 CLINICAL DATA:  Leg swelling for 1 week. Patient has a  history of lung malignancy and prior DVT. EXAM: BILATERAL LOWER EXTREMITY VENOUS DOPPLER ULTRASOUND TECHNIQUE: Gray-scale sonography with graded compression, as well as color Doppler and duplex ultrasound were performed to evaluate the lower extremity deep venous systems from the level of the common femoral vein and including the common femoral, femoral, profunda femoral, popliteal and calf veins including the posterior tibial, peroneal and gastrocnemius veins when visible. The superficial great saphenous vein was also interrogated. Spectral Doppler was utilized to evaluate flow at rest and with distal augmentation maneuvers in the common femoral, femoral and popliteal veins. COMPARISON:  Lower extremity ultrasound dated 06/03/2016. FINDINGS: RIGHT LOWER EXTREMITY Common Femoral Vein: No evidence of thrombus. Normal compressibility, respiratory phasicity and response to augmentation. Saphenofemoral Junction: No evidence of thrombus. Normal compressibility and flow on color Doppler imaging. Profunda Femoral Vein: No evidence of thrombus. Normal compressibility and flow on color Doppler imaging. Femoral Vein: No evidence of thrombus. Normal compressibility, respiratory phasicity and response to augmentation. Popliteal Vein: No evidence of thrombus. Normal compressibility, respiratory phasicity and response to augmentation. Calf Veins: Thrombus is noted in the posterior tibial vein. Superficial Great Saphenous Vein: No evidence of thrombus. Normal compressibility. Venous Reflux:  None. Other Findings:  None. LEFT LOWER EXTREMITY Common Femoral Vein: No evidence of thrombus. Normal compressibility, respiratory phasicity and response to augmentation. Saphenofemoral Junction: No evidence of thrombus. Normal compressibility and flow on color Doppler imaging. Profunda Femoral Vein: No evidence of thrombus. Normal compressibility and flow on color Doppler imaging. Femoral Vein: No evidence of thrombus. Normal  compressibility, respiratory phasicity and response to augmentation. Popliteal Vein: No evidence of thrombus. Normal compressibility, respiratory phasicity and response to augmentation. Calf Veins: Thrombus is noted in the posterior tibial vein. Superficial Great Saphenous Vein: No evidence of thrombus. Normal compressibility. Venous Reflux:  None. Other Findings:  None. IMPRESSION: Occlusive thrombus in the bilateral posterior tibial veins. Electronically Signed   By: Zerita Boers M.D.   On: 12/28/2020 18:57   DG Chest Portable 1 View  Result Date: 12/28/2020 CLINICAL DATA:  Shortness of breath. EXAM: PORTABLE CHEST 1 VIEW COMPARISON:  August 08, 2018 FINDINGS: Post treatment changes are seen in the left upper lobe. The heart, hila, mediastinum, lungs, and pleura are otherwise unremarkable. IMPRESSION: No acute interval change. Electronically Signed   By: Dorise Bullion III M.D   On: 12/28/2020 17:51

## 2020-12-30 NOTE — Assessment & Plan Note (Addendum)
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  Discontinue lorlatinib-given acute issues; STABLE.  # Brain metastases s/p whole brain radiation [finished March 2022]-reviewed brain MRI this admission-partial response; no worsening disease or worsening hemorrhagic disease.  Currently started on anticoagulation with Eliquis [at lower dose]-2.5 mg twice daily.  Continue Keppra.  #History of PE/DVT; with current acute bilateral below the knee DVT s/p IVC filter-started Eliquis 2.5 mg twice daily; however will need to stop prior to HOLEP.  We will increase the dose of Eliquis post HOLEP procedure.  #Urinary retention s/p Foley catheter; UTI-on broad-spectrum antibiotics as per primary team.  Discussed with Dr.Sinikski-plan to reschedule HOLEP-next week until after treatment of UTI.  #Recommend ambulation/physical therapy-patient clinically stable to be discharged home from hematology oncology standpoint.

## 2020-12-30 NOTE — Progress Notes (Signed)
Initial Nutrition Assessment  DOCUMENTATION CODES:  Not applicable  INTERVENTION:   Add carb controlled modifier to diet order to assist with glucose management  Magic cup with dinner, each supplement provides 290 kcal and 9 grams of protein  NUTRITION DIAGNOSIS:  Increased nutrient needs related to cancer and cancer related treatments as evidenced by estimated needs.  GOAL:  Patient will meet greater than or equal to 90% of their needs  MONITOR:  PO intake,Supplement acceptance  REASON FOR ASSESSMENT:  Malnutrition Screening Tool    ASSESSMENT:  Pt presented to ED with weakness and reports that foley catheter not functioning. AMS on admission. Found to have DVTs and clots blocking foley. PMH includes adenocarcinoma of the lung with mets to the brain (stage IV), HTN, DM type 2, CKD3, HLD, chronic foley  Current cancer treatment: Avastin and lorlatinib  Pt resting in bed at the time of visit eating lunch, wife at beside Oncology recommended IVC filter be placed due to high risk of clots and unable to take anti-coagulation medication, which was performed yesterday. Pt reports that his weight has been stable and he generally has a good appetite. Wife states energy levels depend on his mentation which varies from day to day. Pt is not going to be undergoing anymore chemo infusion or radiation at this time. 3.7% weight loss noted in the last month which is not significant for this time frame. Pt states he has ensure at home, but does not typically drink them. Is agreeable to Magic Cup to ensure needs are being met. Prefers vanilla.  4/24 - IVC filter placed  Average Meal Intake: . 4/23-4/25: 95% intake x 1 recorded meals  Nutritionally Relevant Scheduled Meds: . atorvastatin  40 mg Oral Daily  . insulin aspart  0-5 Units Subcutaneous QHS  . insulin aspart  0-9 Units Subcutaneous TID WC  . magic mouthwash  5 mL Oral QID   Nutritionally Relevant Continuous Infusions: . sodium  chloride 50 mL/hr at 12/30/20 0751  . levETIRAcetam 500 mg (12/30/20 0912)   Labs reviewed:  SBG ranges from 94-209 mg/dL over the last 24 hours  Last HgbA1c 9.5% (4/23)  NUTRITION - FOCUSED PHYSICAL EXAM: Flowsheet Row Most Recent Value  Orbital Region No depletion  Upper Arm Region No depletion  Thoracic and Lumbar Region No depletion  Buccal Region No depletion  Temple Region No depletion  Clavicle Bone Region No depletion  Clavicle and Acromion Bone Region No depletion  Scapular Bone Region No depletion  Dorsal Hand No depletion  Patellar Region No depletion  Anterior Thigh Region No depletion  Posterior Calf Region No depletion  Edema (RD Assessment) Mild  Hair Reviewed  Eyes Reviewed  Mouth Reviewed  Skin Reviewed  Nails Reviewed     Diet Order:   Diet Order            Diet regular Room service appropriate? Yes; Fluid consistency: Thin  Diet effective now                 EDUCATION NEEDS:  No education needs have been identified at this time  Skin:  Skin Assessment: Reviewed RN Assessment  Last BM:  4/24 per RN documentation  Height:  Ht Readings from Last 1 Encounters:  12/28/20 '5\' 4"'  (1.626 m)   Weight:  Wt Readings from Last 1 Encounters:  12/28/20 72.5 kg    Ideal Body Weight:  59.1 kg  BMI:  Body mass index is 27.44 kg/m.  Estimated Nutritional Needs:  Kcal:  1700-1900 kcal  Protein:  85-95 g  Fluid:  >1743m/d   RRanell Patrick RD, LDN Clinical Dietitian Pager on [place]

## 2020-12-30 NOTE — Evaluation (Signed)
Physical Therapy Evaluation Patient Details Name: Melvin Willis MRN: [id number] DOB: 10-Jul-1951 Today's Date: 12/30/2020   History of Present Illness  Pt is a 70 y.o. 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    Clinical Impression  Pt was pleasant and motivated to participate during the session.  Pt was able to follow commands with occasional extra time and cuing but had some difficulty providing history and perseverated on holding lines/leads/tubes during the session and required extensive cuing at times to redirect.  Pt did not require physical assistance during the session but did need extra time and effort to complete most functional tasks as well as cuing for safe sequencing with the RW.  Pt is currently receiving HHPT services at home and will benefit from continuing with HHPT upon discharge to safely address deficits listed in patient problem list for decreased caregiver assistance and eventual return to PLOF.      Follow Up Recommendations Home health PT;Supervision/Assistance - 24 hour;Other (comment) (Pt currently receiving HHPT services)    Equipment Recommendations  None recommended by PT    Recommendations for Other Services       Precautions / Restrictions Precautions Precautions: Fall Restrictions Weight Bearing Restrictions: No      Mobility  Bed Mobility Overal bed mobility: Modified Independent             General bed mobility comments: Extra time and effort     Transfers Overall transfer level: Needs assistance Equipment used: Rolling walker (2 wheeled) Transfers: Sit to/from Stand Sit to Stand: Min guard         General transfer comment: Fair eccentric and concentric control and stability with cues for hand placement  Ambulation/Gait Ambulation/Gait assistance: Min guard Gait Distance (Feet): 50 Feet Assistive device: Rolling walker (2 wheeled) Gait Pattern/deviations: Step-through pattern;Decreased step length - right;Decreased step length - left Gait velocity: decreased   General Gait Details: Slow cadence with short B step length with mod to max verbal cues for amb closer to the RW, poor carryover  Stairs            Wheelchair Mobility    Modified Rankin (Stroke Patients Only)       Balance Overall balance assessment: Needs assistance   Sitting balance-Leahy Scale: Good     Standing balance support: Bilateral upper extremity supported;During functional activity Standing balance-Leahy Scale: Fair                               Pertinent Vitals/Pain Pain Assessment: No/denies pain    Home Living Family/patient expects to be discharged to:: Private residence Living Arrangements: Spouse/significant other Available Help at Discharge: Family;Available 24 hours/day Type of Home: House Home Access: Stairs to enter Entrance Stairs-Rails: Right Entrance Stairs-Number of Steps: 2 Home Layout: One level Home Equipment: Walker - 2 wheels Additional Comments: Pt somewhat poor historian, history obtained from combination of patient report and chart review from recent admission    Prior Function Level of Independence: Independent with assistive device(s)  Comments: Mod Ind amb limited community distances with a RW, no fall history, Ind with ADLs, spouse assists with IADLs     Hand Dominance        Extremity/Trunk Assessment   Upper Extremity Assessment Upper Extremity Assessment:  Generalized weakness    Lower Extremity Assessment Lower Extremity Assessment: Generalized weakness       Communication   Communication: No difficulties  Cognition Arousal/Alertness: Awake/alert Behavior During Therapy: WFL for tasks assessed/performed Overall Cognitive Status: No family/caregiver present to determine baseline cognitive functioning                                        General Comments      Exercises Total Joint Exercises Ankle Circles/Pumps: AROM;Strengthening;Both;10 reps Quad Sets: 10 reps;Both;Strengthening Gluteal Sets: Strengthening;Both;10 reps Heel Slides: Strengthening;Both;5 reps Hip ABduction/ADduction: Strengthening;Both;5 reps Straight Leg Raises: Both;5 reps;Strengthening Long Arc Quad: AROM;Strengthening;Both;10 reps Knee Flexion: 10 reps;Both;Strengthening;AROM Marching in Standing: AROM;Strengthening;Both;5 reps;Standing   Assessment/Plan    PT Assessment Patient needs continued PT services  PT Problem List Decreased strength;Decreased activity tolerance;Decreased balance;Decreased mobility;Decreased knowledge of use of DME       PT Treatment Interventions DME instruction;Gait training;Stair training;Functional mobility training;Therapeutic activities;Balance training;Therapeutic exercise;Patient/family education    PT Goals (Current goals can be found in the Care Plan section)  Acute Rehab PT Goals Patient Stated Goal: To get stronger PT Goal Formulation: With patient Time For Goal Achievement: 01/12/21 Potential to Achieve Goals: Fair    Frequency Min 2X/week   Barriers to discharge        Co-evaluation               AM-PAC PT "6 Clicks" Mobility  Outcome Measure Help needed turning from your back to your side while in a flat bed without using bedrails?: None Help needed moving from lying on your back to sitting on the side of a flat bed without using bedrails?: None Help needed moving to and from a  bed to a chair (including a wheelchair)?: A Little Help needed standing up from a chair using your arms (e.g., wheelchair or bedside chair)?: A Little Help needed to walk in hospital room?: A Little Help needed climbing 3-5 steps with a railing? : A Little 6 Click Score: 20    End of Session Equipment Utilized During Treatment: Gait belt Activity Tolerance: Patient tolerated treatment well Patient left: in chair;with call bell/phone within reach;with chair alarm set;with nursing/sitter in room Nurse Communication: Mobility status PT Visit Diagnosis: Difficulty in walking, not elsewhere classified (R26.2);Muscle weakness (generalized) (M62.81)    Time: 4132-4401 PT Time Calculation (min) (ACUTE ONLY): 34 min   Charges:   PT Evaluation $PT Eval Moderate Complexity: 1 Mod PT Treatments $Therapeutic Exercise: 8-22 mins        D. Scott Charnese Federici PT, DPT 12/30/20, 4:10 PM

## 2020-12-30 NOTE — Evaluation (Signed)
Clinical/Bedside Swallow Evaluation Patient Details  Name: Melvin Willis MRN: [id number] Date of Birth: Mar 03, 1951  Today's Date: 12/30/2020 Time: SLP Start Time (ACUTE ONLY): 1120 SLP Stop Time (ACUTE ONLY): 1150 SLP Time Calculation (min) (ACUTE ONLY): 30 min  Past Medical History:  Past Medical History:  Diagnosis Date  . Abnormal prostate specific antigen 08/08/2012  . Adiposity 04/16/2015  . Chronic kidney disease (CKD), stage III (moderate) ([place]) 11/27/2016  . CVA (cerebral vascular accident) ([place]) 06/17/2016  . Diabetes mellitus without complication ([place])   . Diverticulosis of sigmoid colon 04/16/2015  . Dyslipidemia 03/18/2015  . Hemorrhoids, internal 04/16/2015  . Hypercholesteremia 04/16/2015  . Hyperlipidemia   . Hypertension   . Primary cancer of left upper lobe of lung ([place])   . Pulmonary embolism ([place])   . Wears dentures    partial upper   Past Surgical History:  Past Surgical History:  Procedure Laterality Date  . COLONOSCOPY    . COLONOSCOPY WITH PROPOFOL N/A 05/06/2015   Procedure: COLONOSCOPY WITH PROPOFOL;  Surgeon: Lucilla Lame, MD;  Location: [facility];  Service: Endoscopy;  Laterality: N/A;  ASCENDING COLON POLYPS X 2 TERMINAL ILEUM BIOPSY RANDOM COLON BX. TRANSVERSE COLON POLYP SIGMOID COLON POLYP  . ESOPHAGOGASTRODUODENOSCOPY (EGD) WITH PROPOFOL N/A 05/06/2015   Procedure: ESOPHAGOGASTRODUODENOSCOPY (EGD) WITH PROPOFOL;  Surgeon: Lucilla Lame, MD;  Location: [facility];  Service: Endoscopy;  Laterality: N/A;  GASTRIC BIOPSY X1  . IVC FILTER INSERTION N/A 12/29/2020   Procedure: IVC FILTER INSERTION;  Surgeon: Dwana Curd, MD;  Location: [facility];  Service: Cardiovascular;  Laterality: N/A;  . KIDNEY STONE SURGERY  2017   HPI:  Per admitting H&P " Melvin Willis is a 70 y.o. 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     Patient alert and oriented only to self and place.  He is unable to provide any history and wife is not at bedside.  Per ED documentation, patient has been having difficulty with urine coming out of his Foley catheter.  He has planned procedure with urology on his prostate next week.  They also notes increase swelling in his legs that have worsened in the past week.     Foley was replaced in the ED with UA showing UTI. Pt was started on ceftriaxone.  Venous Doppler ultrasound was positive for occlusive thrombus of bilateral lower posterior tibial veins.  EDP discussed with vascular surgery Dr.Albrecht who would be able to place IVC filter if recommended by oncology.  Oncology Dr. Janese Banks was notified and recommends IVC filter and will discuss anticoagulation with his oncologist. "   Assessment / Plan / Recommendation Clinical Impression  Pt presents with functional swallowing abilities at bedside with no overt s/s of aspiration. Oral mech exam revealed structures to be functining adequately with no unilateral weakness. Pt was able to masticate solids and propell for a swallow with little delay. Min to no oral residue after the swallow. Vocal quality remained clear and laryngeal elevation appeared adequate.Rec Regular, carb controlled diet. No ST follow up indicated at this time. Please reconsult or secure chat ST if further concerns arise. Prognosis good. Meds whole, one at a time with water or applesauce. SLP Visit Diagnosis: Dysphagia,  oral phase (R13.11)    Aspiration Risk  Mild aspiration risk    Diet Recommendation Regular;Thin liquid   Medication Administration: Whole meds with liquid Supervision: Patient able to self feed Compensations: Slow rate;Minimize environmental  distractions Postural Changes: Seated upright at 90 degrees    Other  Recommendations   Pt and OT following  Follow up Recommendations None      Frequency and Duration     No ST indicated       Prognosis   Good for toleration of diet     Swallow Study   General Date of Onset: 12/28/20 HPI: Per admitting H&P " Melvin Willis is a 70 y.o. 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     Patient alert and oriented only to self and place.  He is unable to provide any history and wife is not at bedside.  Per ED documentation, patient has been having difficulty with urine coming out of his Foley catheter.  He has planned procedure with urology on his prostate next week.  They also notes increase swelling in his legs that have worsened in the past week.     Foley was replaced in the ED with UA showing UTI. Pt was started on ceftriaxone.  Venous Doppler ultrasound was positive for occlusive thrombus of bilateral lower posterior tibial veins.  EDP discussed with vascular surgery Dr.Albrecht who would be able to place IVC filter if recommended by oncology.  Oncology Dr. Janese Banks was notified and recommends IVC filter and will discuss anticoagulation with his oncologist. " Type of Study: Bedside Swallow Evaluation Diet Prior to this Study: NPO Temperature Spikes Noted: No Respiratory Status: Room air History of Recent Intubation: No Behavior/Cognition: Alert;Cooperative;Pleasant mood;Confused Oral Cavity Assessment: Within Functional Limits Oral Care Completed by SLP: No Oral Cavity - Dentition: Adequate natural dentition Vision: Functional for self-feeding Self-Feeding Abilities: Able to  feed self;Needs set up Patient Positioning: Upright in bed Baseline Vocal Quality: Normal Volitional Cough: Strong Volitional Swallow: Able to elicit    Oral/Motor/Sensory Function Overall Oral Motor/Sensory Function: Within functional limits   Ice Chips Ice chips: Within functional limits Presentation: Spoon   Thin Liquid Thin Liquid: Within functional limits Presentation: Cup;Spoon;Straw    Nectar Thick Nectar Thick Liquid: Not tested   Honey Thick Honey Thick Liquid: Not tested   Puree Puree: Within functional limits   Solid     Solid: Within functional limits Presentation: Self Fed      Lucila Maine 12/30/2020,11:57 AM

## 2020-12-30 NOTE — Consult Note (Signed)
Pharmacy Antibiotic Note  Melvin Willis is a 70 y.o. male admitted on 12/28/2020 with UTI (pseudomonas and enterococcus).    Pharmacy has been consulted for Zosyn dosing.  Plan: Zosyn 3.375g IV q8h (4 hour infusion).  Height: 5\' 4"  (162.6 cm) Weight: 72.5 kg (159 lb 13.3 oz) IBW/kg (Calculated) : 59.2  Temp (24hrs), Avg:97.9 F (36.6 C), Min:96.5 F (35.8 C), Max:98.5 F (36.9 C)  Recent Labs  Lab 12/27/20 1352 12/28/20 1702 12/28/20 2058 12/29/20 0436 12/30/20 0444  WBC 6.8 7.9  --  6.1 6.4  CREATININE 1.09 1.52*  --  1.16 1.21  LATICACIDVEN  --  2.7* 2.0*  --   --     Estimated Creatinine Clearance: 54 mL/min (by C-G formula based on SCr of 1.21 mg/dL).    No Known Allergies  Antimicrobials this admission: 4/23 Ceftriaxone >> 4/24 4/25 Zosyn  >>   Dose adjustments this admission: N/A  Microbiology results: 4/23 UCx: 100K+ psuedo/enterococcus   COVID/FLU NEG  Thank you for allowing pharmacy to be a part of this patient's care.  Lu Duffel, PharmD, BCPS Clinical Pharmacist 12/30/2020 1:31 PM

## 2020-12-31 ENCOUNTER — Other Ambulatory Visit: Admission: RE | Admit: 2020-12-31 | Payer: Medicare Other | Source: Ambulatory Visit

## 2020-12-31 DIAGNOSIS — C7931 Secondary malignant neoplasm of brain: Secondary | ICD-10-CM | POA: Diagnosis not present

## 2020-12-31 DIAGNOSIS — I82443 Acute embolism and thrombosis of tibial vein, bilateral: Secondary | ICD-10-CM | POA: Diagnosis not present

## 2020-12-31 LAB — CBC
HCT: 37.2 % — ABNORMAL LOW (ref 39.0–52.0)
Hemoglobin: 12.6 g/dL — ABNORMAL LOW (ref 13.0–17.0)
MCH: 30.2 pg (ref 26.0–34.0)
MCHC: 33.9 g/dL (ref 30.0–36.0)
MCV: 89.2 fL (ref 80.0–100.0)
Platelets: 154 10*3/uL (ref 150–400)
RBC: 4.17 MIL/uL — ABNORMAL LOW (ref 4.22–5.81)
RDW: 15 % (ref 11.5–15.5)
WBC: 5 10*3/uL (ref 4.0–10.5)
nRBC: 0 % (ref 0.0–0.2)

## 2020-12-31 LAB — BASIC METABOLIC PANEL
Anion gap: 6 (ref 5–15)
BUN: 16 mg/dL (ref 8–23)
CO2: 25 mmol/L (ref 22–32)
Calcium: 8.9 mg/dL (ref 8.9–10.3)
Chloride: 108 mmol/L (ref 98–111)
Creatinine, Ser: 1.28 mg/dL — ABNORMAL HIGH (ref 0.61–1.24)
GFR, Estimated: >60 mL/min (ref >60)
Glucose, Bld: 259 mg/dL — ABNORMAL HIGH (ref 70–99)
Potassium: 3.5 mmol/L (ref 3.5–5.1)
Sodium: 139 mmol/L (ref 135–145)

## 2020-12-31 LAB — URINE CULTURE: Culture: >=100000 — AB

## 2020-12-31 LAB — GLUCOSE, CAPILLARY
Glucose-Capillary: 203 mg/dL — ABNORMAL HIGH (ref 70–99)
Glucose-Capillary: 244 mg/dL — ABNORMAL HIGH (ref 70–99)
Glucose-Capillary: 277 mg/dL — ABNORMAL HIGH (ref 70–99)
Glucose-Capillary: 295 mg/dL — ABNORMAL HIGH (ref 70–99)

## 2020-12-31 MED ORDER — CIPROFLOXACIN HCL 500 MG PO TABS
500.0000 mg | ORAL_TABLET | Freq: Two times a day (BID) | ORAL | Status: DC
  Administered 2020-12-31 – 2021-01-01 (×2): 500 mg via ORAL
  Filled 2020-12-31 (×2): qty 1

## 2020-12-31 MED ORDER — AMOXICILLIN 500 MG PO CAPS
500.0000 mg | ORAL_CAPSULE | Freq: Three times a day (TID) | ORAL | Status: DC
  Administered 2020-12-31 – 2021-01-01 (×3): 500 mg via ORAL
  Filled 2020-12-31 (×5): qty 1

## 2020-12-31 MED ORDER — APIXABAN 2.5 MG PO TABS
2.5000 mg | ORAL_TABLET | Freq: Two times a day (BID) | ORAL | Status: DC
  Administered 2020-12-31 – 2021-01-01 (×3): 2.5 mg via ORAL
  Filled 2020-12-31 (×2): qty 1

## 2020-12-31 MED ORDER — INSULIN GLARGINE 100 UNIT/ML ~~LOC~~ SOLN
10.0000 [IU] | Freq: Every day | SUBCUTANEOUS | Status: DC
  Administered 2020-12-31: 10 [IU] via SUBCUTANEOUS
  Filled 2020-12-31 (×2): qty 0.1

## 2020-12-31 NOTE — TOC Initial Note (Signed)
Transition of Care [facility]) - Initial/Assessment Note    Patient Details  Name: Melvin Willis MRN: [id number] Date of Birth: 1950-12-04  Transition of Care [facility]) CM/SW Contact:    Shelbie Hutching, RN Phone Number: 12/31/2020, 11:17 AM  Clinical Narrative:                 Patient admitted to the hospital with acute DVT and UTI.  RNCM met with patient at the bedside this morning.  Patient is very sleepy but is able to answer a few questions.  Patient is from home with his wife.  Patient reports that he uses a cane and is able to drive.  Patient is active with home health RN and PT through [facility].  Corene Cornea with [facility] is aware of admission to the hospital.   [facility] will follow and assist with any additional discharge needs.   Expected Discharge Plan: [place] Barriers to Discharge: Continued Medical Work up   Patient Goals and CMS Choice Patient states their goals for this hospitalization and ongoing recovery are:: Patient very sleepy this morning, unable to state goals CMS Medicare.gov Compare Post Acute Care list provided to:: Patient Choice offered to / list presented to : Patient  Expected Discharge Plan and Services Expected Discharge Plan: [place]   Discharge Planning Services: CM Consult Post Acute Care Choice: [place] arrangements for the past 2 months: Single Family Home                 DME Arranged: N/A DME Agency: NA       HH Arranged: RN,PT [place] Agency: [place] ([place]) Date HH Agency Contacted: 12/30/20 Time [place]: [place] Representative spoke with at [place]: Corene Cornea  Prior Living Arrangements/Services Living arrangements for the past 2 months: [place] with:: Spouse Patient language and need for interpreter reviewed:: Yes Do you feel safe going back to the place where you live?: Yes      Need for Family Participation in Patient Care: Yes (Comment)  (UTI) Care giver support system in place?: Yes (comment) (wife) Current home services: DME (cane) Criminal Activity/Legal Involvement Pertinent to Current Situation/Hospitalization: No - Comment as needed  Activities of Daily Living Home Assistive Devices/Equipment: Engineer, drilling (specify type),Bedside commode/3-in-1 ADL Screening (condition at time of admission) Patient's cognitive ability adequate to safely complete daily activities?: Yes Is the patient deaf or have difficulty hearing?: No Does the patient have difficulty seeing, even when wearing glasses/contacts?: No Does the patient have difficulty concentrating, remembering, or making decisions?: Yes Patient able to express need for assistance with ADLs?: Yes Does the patient have difficulty dressing or bathing?: Yes Independently performs ADLs?: No Communication: Independent Dressing (OT): Needs assistance Grooming: Needs assistance Is this a change from baseline?: Pre-admission baseline Feeding: Independent Bathing: Needs assistance Is this a change from baseline?: Pre-admission baseline Toileting: Needs assistance Is this a change from baseline?: Pre-admission baseline In/Out Bed: Needs assistance Is this a change from baseline?: Pre-admission baseline Walks in Home: Independent Does the patient have difficulty walking or climbing stairs?: Yes Weakness of Legs: Both Weakness of Arms/Hands: None  Permission Sought/Granted Permission sought to share information with : Case Manager,Other (comment),Family Supports Permission granted to share information with : Yes, Verbal Permission Granted  Share Information with NAME: Lattie Haw  Permission granted to share info w AGENCY: [facility] granted to share info w Relationship: wife     Emotional  Assessment Appearance:: Appears stated age Attitude/Demeanor/Rapport: Lethargic Affect (typically observed): Accepting Orientation: : Oriented to Self,Oriented  to Place,Oriented to  Time,Oriented to Situation Alcohol / Substance Use: Not Applicable Psych Involvement: No (comment)  Admission diagnosis:  Urinary retention [R33.9] UTI (urinary tract infection) [N39.0] Acute deep vein thrombosis (DVT) of tibial vein of both lower extremities (HCC) [I82.443] Urinary tract infection associated with indwelling urethral catheter, initial encounter ([place]) [K38.381M, N39.0] Patient Active Problem List   Diagnosis Date Noted  . Acute deep vein thrombosis (DVT) of tibial vein of both lower extremities (HCC)   . Acute urinary retention 12/29/2020  . Leg DVT (deep venous thromboembolism), acute, bilateral ([place]) 12/29/2020  . UTI (urinary tract infection) 12/28/2020  . Delirium   . Palliative care encounter   . Pneumonia due to infectious organism   . Sepsis ([place]) 11/14/2020  . Septic shock ([place]) 11/14/2020  . Acute metabolic encephalopathy 40/37/5436  . Acute respiratory failure ([place]) 11/14/2020  . Transaminitis 11/14/2020  . Supratherapeutic INR 11/14/2020  . Acute-on-chronic kidney injury ([place]) 11/14/2020  . Brain metastasis ([place]) 09/25/2019  . Atherosclerosis of aorta ([place]) 09/04/2019  . Goals of care, counseling/discussion 08/02/2019  . Primary malignant neoplasm of lung metastatic to other site ([place]) 04/07/2019  . Chronic deep vein thrombosis (DVT) of left lower extremity ([place]) 01/05/2018  . Chronic kidney disease (CKD), stage III (moderate) ([place]) 11/27/2016  . Old cerebrovascular accident (CVA) without late effect   . Blurred vision, bilateral 06/23/2016  . Primary cancer of left upper lobe of lung ([place]) 06/12/2016  . Mediastinal mass   . History of nephrolithiasis 03/27/2016  . Pharyngeal dysphagia 01/01/2016  . Benign neoplasm of ascending colon   . Benign neoplasm of sigmoid colon   . Benign neoplasm of transverse colon   . Diverticulosis of large intestine without diverticulitis   . Overweight (BMI 25.0-29.9) 04/16/2015  . Type 2  diabetes mellitus with stage 3 chronic kidney disease ([place]) 03/18/2015  . Dyslipidemia 03/18/2015  . Disorder of male genital organ 08/08/2012  . 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 Elevated PSA 08/08/2012   PCP:  [name], MD Pharmacy:   [facility] [address], [place] - [place] [address] [place] [address] Phone: [phone] Fax: [phone]  [facility] [address]), [place] [address] Phone: [phone] Fax: [place], [place]. [place] [address] Phone: [phone] Fax: [phone]  [place], [place] [address] [address] Phone: [phone] Fax: [phone]  [place], [place] [address] Phone: [phone] Fax: [phone]     Social Determinants of Health (SDOH) Interventions    Readmission Risk Interventions No flowsheet data found.

## 2020-12-31 NOTE — Progress Notes (Signed)
Melvin Willis   DOB:April 19, 1951   CL#:[id number]    Subjective: Patient denies fevers or chills.  No nausea vomiting.  He has been working with physical therapy.  Denies any worsening swelling of the legs.  Objective:  Vitals:   12/31/20 1140 12/31/20 1610  BP: 138/89 118/71  Pulse: 88 82  Resp: 20 18  Temp: 97.6 F (36.4 C) 99.5 F (37.5 C)  SpO2: 96% 96%     Intake/Output Summary (Last 24 hours) at 12/31/2020 2015 Last data filed at 12/31/2020 1715 Gross per 24 hour  Intake 1078.96 ml  Output 2250 ml  Net -1171.04 ml    Physical Exam HENT:     Head: Normocephalic and atraumatic.     Mouth/Throat:     Pharynx: No oropharyngeal exudate.  Eyes:     Pupils: Pupils are equal, round, and reactive to light.  Cardiovascular:     Rate and Rhythm: Normal rate and regular rhythm.  Pulmonary:     Effort: No respiratory distress.     Breath sounds: No wheezing.     Comments: Decreased breath sounds at the bases bilaterally. Abdominal:     General: Bowel sounds are normal. There is no distension.     Palpations: Abdomen is soft. There is no mass.     Tenderness: There is no abdominal tenderness. There is no guarding or rebound.  Musculoskeletal:        General: No tenderness. Normal range of motion.     Cervical back: Normal range of motion and neck supple.  Skin:    General: Skin is warm.  Neurological:     Mental Status: He is alert.     Comments: Oriented x2-3.  Accompanied by his wife.  Psychiatric:        Mood and Affect: Affect normal.      Labs:  Lab Results  Component Value Date   WBC 5.0 12/31/2020   HGB 12.6 (L) 12/31/2020   HCT 37.2 (L) 12/31/2020   MCV 89.2 12/31/2020   PLT 154 12/31/2020   NEUTROABS 4.7 12/28/2020    Lab Results  Component Value Date   NA 139 12/31/2020   K 3.5 12/31/2020   CL 108 12/31/2020   CO2 25 12/31/2020    Studies:  No results found.  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  Discontinue lorlatinib-given acute issues.  #Brain metastases s/p whole brain radiation [finished March 2022]-reviewed brain MRI this admission-partial response; no worsening disease or worsening hemorrhagic disease. So plan to re-start anticoagulation with Eliquis [at lower dose]-2.5 mg twice daily.  Continue Keppra.  #History of PE/DVT; with current acute bilateral below the knee DVT s/p IVC filter-started Eliquis 2.5 mg twice daily; however will need to stop prior to HOLEP.   #Urinary retention s/p Foley catheter; UTI-on broad-spectrum antibiotics as per primary team.  Discussed with Dr.Sinikski-plan to reschedule HOLEP-next week until after treatment of UTI.  #The above plan of care was discussed with the patient's wife Melvin Willis at length.  Discussed with the hospitalist, Dr.[place]way.      Cammie Sickle, MD 12/31/2020  8:15 PM

## 2020-12-31 NOTE — Evaluation (Signed)
Occupational Therapy Evaluation Patient Details Name: Melvin Willis MRN: [id number] DOB: Oct 03, 1950 Today's Date: 12/31/2020    History of Present Illness Pt is a 70 y.o. 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   Clinical Impression   Melvin Willis was seen for OT evaluation this date. Prior to hospital admission, pt was MOD I for mobility using SPC/RW. Pt lives with wife in 1 level home c 2 STE. Pt presents to acute OT demonstrating impaired ADL performance and functional mobility 2/2 decreased activity tolerance, functional strength/ROM/balance deficits, and poor insight into deficits. Pt is A&O x4, demonstrates poor STM as pt unable to recall eating breakfast this AM.   Pt currently requires SBA don B socks seated EOB - dmonstrates poor safety awareness and requires multiple attempts to complete. CGA + RW for toilet t/f and grooming standing sinkside - MOD VCs for safety and RW mgmt. Pt would benefit from skilled OT to address noted impairments and functional limitations (see below for any additional details) in order to maximize safety and independence while minimizing falls risk and caregiver burden. Upon hospital discharge, recommend HHOT to maximize pt safety and return to functional independence during meaningful occupations of daily life.     Follow Up Recommendations  Home health OT;Supervision/Assistance - 24 hour    Equipment Recommendations  None recommended by OT    Recommendations for  Other Services       Precautions / Restrictions Precautions Precautions: Fall Restrictions Weight Bearing Restrictions: No      Mobility Bed Mobility Overal bed mobility: Modified Independent             General bed mobility comments: Extra time and effort    Transfers Overall transfer level: Needs assistance Equipment used: Rolling walker (2 wheeled) Transfers: Sit to/from Stand Sit to Stand: Min guard         General transfer comment: Fair eccentric and concentric control and stability with cues for hand placement    Balance Overall balance assessment: Needs assistance   Sitting balance-Leahy Scale: Good     Standing balance support: Single extremity supported;During functional activity Standing balance-Leahy Scale: Fair Standing balance comment: single UE forearm support during grooming                           ADL either performed or assessed with clinical judgement   ADL Overall ADL's : Needs assistance/impaired                                       General ADL Comments: SBA don B socks seated EOB - dmonstrates poor safety awareness and requires multiple attempts to complete. CGA + RW for toilet t/f and grooming standing sinkside - MOD VCs for safety and RW mgmt                  Pertinent Vitals/Pain Pain Assessment: Faces Faces Pain Scale: Hurts little more Pain Location: back Pain Descriptors / Indicators: Dull;Discomfort  Pain Intervention(s): Limited activity within patient's tolerance;Repositioned;Patient requesting pain meds-RN notified     Hand Dominance Right   Extremity/Trunk Assessment Upper Extremity Assessment Upper Extremity Assessment: Generalized weakness   Lower Extremity Assessment Lower Extremity Assessment: Generalized weakness       Communication Communication Communication: No difficulties   Cognition Arousal/Alertness: Awake/alert Behavior During Therapy: WFL for tasks  assessed/performed Overall Cognitive Status: No family/caregiver present to determine baseline cognitive functioning                                 General Comments: A&O x4, decreased STM   General Comments       Exercises Exercises: Other exercises Other Exercises Other Exercises: Pt educated re: OT role, DME recs, d/c recs, falls prevention, ECS Other Exercises: LBD< grooming, sup>sit, sit<>stand, ~40 ft mobility, sitting/standing balance/tolerance   Shoulder Instructions      Home Living Family/patient expects to be discharged to:: Private residence Living Arrangements: Spouse/significant other Available Help at Discharge: Family;Available 24 hours/day Type of Home: House Home Access: Stairs to enter [employer] of Steps: 2 Entrance Stairs-Rails: Right Home Layout: One level               Home Equipment: Walker - 2 wheels   Additional Comments: Pt somewhat poor historian, history obtained from combination of patient report and chart review from recent admission      Prior Functioning/Environment Level of Independence: Independent with assistive device(s)        Comments: Mod Ind amb limited community distances with a RW, no fall history, Ind with ADLs, spouse assists with IADLs        OT Problem List: Decreased strength;Decreased activity tolerance;Impaired balance (sitting and/or standing);Decreased safety awareness;Decreased knowledge of use of DME or AE      OT Treatment/Interventions: Self-care/ADL training;Therapeutic exercise;DME and/or AE instruction;Energy conservation;Therapeutic activities;Patient/family education;Balance training    OT Goals(Current goals can be found in the care plan section) Acute Rehab OT Goals Patient Stated Goal: To get stronger OT Goal Formulation: With patient Time For Goal Achievement: 01/14/21 Potential to Achieve Goals: Good ADL Goals Pt Will Perform Grooming: with modified  independence;standing (c LRAD PRN and no cues for safety) Pt Will Perform Lower Body Dressing: Independently;sit to/from stand Pt Will Transfer to Toilet: with modified independence;ambulating;regular height toilet (c LRAD PRN)  OT Frequency: Min 2X/week    AM-PAC OT "6 Clicks" Daily Activity     Outcome Measure Help from another person eating meals?: A Little Help from another person taking care of personal grooming?: A Little Help from another person toileting, which includes using toliet, bedpan, or urinal?: A Little Help from another person bathing (including washing, rinsing, drying)?: A Little Help from another person to put on and taking off regular upper body clothing?: A Little Help from another person to put on and taking off regular lower body clothing?: A Little 6 Click Score: 18   End of Session Equipment Utilized During Treatment: Rolling walker;Gait belt Nurse Communication: Mobility status;Patient requests pain meds  Activity Tolerance: Patient tolerated treatment well Patient left: in bed;with call bell/phone within reach;with bed alarm set;with nursing/sitter in room  OT Visit Diagnosis: Other abnormalities of gait and mobility (R26.89);Muscle weakness (generalized) (M62.81)                Time: 3154-0086 OT Time Calculation (min): 22 min Charges:  OT General Charges $OT Visit: 1 Visit OT Evaluation $OT Eval  Low Complexity: 1 Low OT Treatments $Self Care/Home Management : 8-22 mins   Dessie Coma, M.S. OTR/L  12/31/20, 11:27 AM  ascom [phone]

## 2020-12-31 NOTE — Progress Notes (Addendum)
PROGRESS NOTE                                                                             PROGRESS NOTE                                                                                                                                                                                                             Patient Demographics:    Melvin Willis, is a 70 y.o. male, DOB - [birth year], CVE:[id number]  Outpatient Primary MD for the patient is [name], MD    LOS - 3  Admit date - 12/28/2020    Chief Complaint  Patient presents with  . Urinary Retention  . Weakness       Brief Narrative    Melvin Willis is a 70 y.o. 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 Pt was started on ceftriaxone.  Venous Doppler ultrasound was positive for occlusive thrombus of bilateral lower posterior tibial veins.  Patient not on full anticoagulation due to known brain diastasis with hemorrhage, seen by vascular surgery, IVC filter inserted 4/24.  MRI of the brain showing provement of brain mets, and stable chronic hemorrhage.   Subjective:    Melvin Willis today some with delirium and restlessness and confusion overnight.   Assessment  & Plan :    Principal Problem:   UTI (urinary tract infection) Active Problems:   Type 2 diabetes mellitus with stage 3 chronic kidney disease (HCC)   Dyslipidemia   Primary malignant neoplasm of lung metastatic to other site [facility])   Brain metastasis ([place])   Acute-on-chronic kidney injury ([place])   Acute urinary retention   Leg DVT (deep venous  thromboembolism), acute, bilateral ([place])  Acute deep vein thrombosis (DVT) of tibial vein of both lower extremities (HCC)  UTI w/ chronic urinary retention -pt has hx of BPH and had urinary retention with Foley catheter placed by urology on 4/7.  He has planned HOLEP procedure on 4/29.  - Foley changed out in ED with good flow -Urine culture growing Enterococcus and Pseudomonas, to biotics has been changed to Zosyn, have discussed with ID Dr. Ola Spurr regarding discharge regimen, he recommends ciprofloxacin 500 mg oral twice daily, and amoxicillin 500 mg oral 3 times daily, until his anticipated urological procedure (01/10/2021, ), or even further if deemed necessary by urologist.  . -Continue tamsulosin and finasteride  Bilateral lower extremity occlusive thrombus of the posterior tibial veins -Has history of PE and bilateral lower extremity DVT.  Previously on Xarelto but has been held by oncology due to recent hemorrhagic metastasis. -Very difficult situation, with known history of PE, and apparently with worsening lower extremity DVT, and his risk for full anticoagulation due to recent hemorrhagic brain metastasis.  VC filter was placed on 4/24. -Started on subcu heparin for DVT prophylaxis 4/25, to transition to low-dose Eliquis, given his MRI showing increased brain metastasis with stable chronic hemorrhage.  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 Rogue Bussing, consult appreciated -Continue Keppra -MRI brain with/without contrast does not show any evidence of worsening brain metastasis or hemorrhage.  Acute metabolic encephalopathy  -Most likely due to infectious process, UTI with underlining brain metastasis.   -improving, but still having some hospital delirium.  Acute on chronic CKD stage III Creatinine of 1.52 from prior 1.09 Improving with IV fluids  Type 2 diabetes with hyperglycemia - sensitive SSI  -  Continue Gabapentin -CBG remains elevated, so he was started on low-dose Lantus and change his diet to carb modified.  Hyperlipidemia -Continue statin    SpO2: 96 % O2 Flow Rate (L/min): 2 L/min  Recent Labs  Lab 12/27/20 1352 12/28/20 1702 12/28/20 2058 12/29/20 0436 12/30/20 0444 12/31/20 0427  WBC 6.8 7.9  --  6.1 6.4 5.0  PLT 142* 166  --  143* 148* 154  BNP  --  38.3  --   --   --   --   AST 21 19  --   --  17  --   ALT 41 38  --   --  27  --   ALKPHOS 65 67  --   --  55  --   BILITOT 0.5 0.7  --   --  0.6  --   ALBUMIN 3.3* 3.7  --   --  2.8*  --   LATICACIDVEN  --  2.7* 2.0*  --   --   --   SARSCOV2NAA  --  NEGATIVE  --   --   --   --        ABG  No results found for: PHART, PCO2ART, PO2ART, HCO3, TCO2, ACIDBASEDEF, O2SAT        Condition - Extremely Guarded  Family Communication  :  D/W wife by phone 4/25, wife updated today by oncology service.  Code Status :  Full  Consults  :  Medical oncology, vascular surgery  Procedures  :  IVC filter insertion    Status is: Inpatient  Remains inpatient appropriate because:Ongoing diagnostic testing needed not appropriate for outpatient work up, Unsafe d/c plan and IV treatments appropriate due to intensity of illness or inability to take PO  Dispo: The patient is from: Home              Anticipated d/c is to: Home              Patient currently is not medically stable to d/c.  Patient was discharged home tomorrow with home health on p.o. antibiotic   Difficult to place patient No      DVT Prophylaxis  : Eliquis Lab Results  Component Value Date   PLT 154 12/31/2020    Diet :  Diet Order            Diet regular Room service appropriate? Yes with Assist; Fluid consistency: Thin  Diet effective now                  Inpatient Medications  Scheduled Meds: . apixaban  2.5 mg Oral BID  . atorvastatin  40 mg Oral Daily  . Chlorhexidine Gluconate Cloth  6 each Topical Daily  . finasteride   5 mg Oral Daily  . gabapentin  300 mg Oral QHS  . insulin aspart  0-5 Units Subcutaneous QHS  . insulin aspart  0-9 Units Subcutaneous TID WC  . insulin glargine  10 Units Subcutaneous Daily  . magic mouthwash  5 mL Oral QID  . melatonin  5 mg Oral QHS  . tamsulosin  0.4 mg Oral q AM   Continuous Infusions: . sodium chloride 50 mL/hr at 12/31/20 0602  . levETIRAcetam 500 mg (12/31/20 0942)  . piperacillin-tazobactam (ZOSYN)  IV 3.375 g (12/31/20 1329)   PRN Meds:.  Antibiotics  :    Anti-infectives (From admission, onward)   Start     Dose/Rate Route Frequency Ordered Stop   12/30/20 1415  piperacillin-tazobactam (ZOSYN) IVPB 3.375 g        3.375 g 12.5 mL/hr over 240 Minutes Intravenous Every 8 hours 12/30/20 1327     12/29/20 1800  cefTRIAXone (ROCEPHIN) 1 g in sodium chloride 0.9 % 100 mL IVPB  Status:  Discontinued        1 g 200 mL/hr over 30 Minutes Intravenous Every 24 hours 12/28/20 2111 12/30/20 1315   12/28/20 1900  cefTRIAXone (ROCEPHIN) 1 g in sodium chloride 0.9 % 100 mL IVPB        1 g 200 mL/hr over 30 Minutes Intravenous  Once 12/28/20 1848 12/28/20 2122       Phillips Climes M.D on 12/31/2020 at 2:28 PM  To page go to [email or web address]  [facility] -  Office  [phone]      Objective:   Vitals:   12/31/20 0008 12/31/20 0432 12/31/20 0718 12/31/20 1140  BP: (!) 139/115 (!) 92/47 106/60 138/89  Pulse: 76 (!) 56 (!) 58 88  Resp: 18 18 18 20   Temp: 97.8 F (36.6 C) 97.6 F (36.4 C) 98.3 F (36.8 C) 97.6 F (36.4 C)  TempSrc:    Oral  SpO2: 91% 94% 97% 96%  Weight:      Height:        Wt Readings from Last 3 Encounters:  12/28/20 72.5 kg  12/27/20 73.2 kg  12/13/20 71.2 kg     Intake/Output Summary (Last 24 hours) at 12/31/2020 1428 Last data filed at 12/31/2020 1344 Gross per 24 hour  Intake 1543.5 ml  Output 1400 ml  Net 143.5 ml     Physical Exam  Awake Alert, remains mildly confused, but pleasant, easily distracted  with impaired cognition and insight Symmetrical Chest wall movement, Good air  movement bilaterally, CTAB RRR,No Gallops,Rubs or new Murmurs, No Parasternal Heave +ve B.Sounds, Abd Soft, No tenderness, No rebound - guarding or rigidity. No Cyanosis, Clubbing ,+1 edema, No new Rash or bruise         Data Review:    CBC Recent Labs  Lab 12/27/20 1352 12/28/20 1702 12/29/20 0436 12/30/20 0444 12/31/20 0427  WBC 6.8 7.9 6.1 6.4 5.0  HGB 14.1 14.1 12.7* 13.4 12.6*  HCT 41.5 42.0 36.7* 39.4 37.2*  PLT 142* 166 143* 148* 154  MCV 89.2 88.8 87.2 88.1 89.2  MCH 30.3 29.8 30.2 30.0 30.2  MCHC 34.0 33.6 34.6 34.0 33.9  RDW 15.4 15.4 15.6* 15.1 15.0  LYMPHSABS 1.9 2.2  --   --   --   MONOABS 0.7 0.8  --   --   --   EOSABS 0.1 0.0  --   --   --   BASOSABS 0.0 0.0  --   --   --     Recent Labs  Lab 12/27/20 1352 12/28/20 1702 12/28/20 2058 12/29/20 0436 12/30/20 0444 12/31/20 0427  NA 137 132*  --  139 137 139  K 4.1 4.1  --  3.5 5.0 3.5  CL 103 101  --  109 106 108  CO2 24 20*  --  24 24 25   GLUCOSE 325* 368*  --  101* 209* 259*  BUN 13 17  --  12 14 16   CREATININE 1.09 1.52*  --  1.16 1.21 1.28*  CALCIUM 9.5 10.1  --  9.3 9.5 8.9  AST 21 19  --   --  17  --   ALT 41 38  --   --  27  --   ALKPHOS 65 67  --   --  55  --   BILITOT 0.5 0.7  --   --  0.6  --   ALBUMIN 3.3* 3.7  --   --  2.8*  --   LATICACIDVEN  --  2.7* 2.0*  --   --   --   HGBA1C  --  9.5*  --   --   --   --   AMMONIA  --   --   --   --  16  --   BNP  --  38.3  --   --   --   --     ------------------------------------------------------------------------------------------------------------------ No results for input(s): CHOL, HDL, LDLCALC, TRIG, CHOLHDL, LDLDIRECT in the last 72 hours.  Lab Results  Component Value Date   HGBA1C 9.5 (H) 12/28/2020   ------------------------------------------------------------------------------------------------------------------ No results for input(s): TSH,  T4TOTAL, T3FREE, THYROIDAB in the last 72 hours.  Invalid input(s): FREET3  Cardiac Enzymes No results for input(s): CKMB, TROPONINI, MYOGLOBIN in the last 168 hours.  Invalid input(s): CK ------------------------------------------------------------------------------------------------------------------    Component Value Date/Time   BNP 38.3 12/28/2020 1702    Micro Results Recent Results (from the past 240 hour(s))  CULTURE, URINE COMPREHENSIVE     Status: Abnormal   Collection Time: 12/24/20 11:04 AM   Specimen: Urine   UR  Result Value Ref Range Status   Urine Culture, Comprehensive Final report (A)  Final   Organism ID, Bacteria Comment (A)  Final    Comment: Vancomycin-resistant Enterococcus (Enterococcus faecalis) Greater than 100,000 colony forming units per mL    ANTIMICROBIAL SUSCEPTIBILITY Comment  Final    Comment:       ** S = Susceptible; I = Intermediate; R = Resistant **  P = Positive; N = Negative             MICS are expressed in micrograms per mL    Antibiotic                 RSLT#1    RSLT#2    RSLT#3    RSLT#4 Ciprofloxacin                  R Levofloxacin                   R Linezolid                      S Nitrofurantoin                 S Penicillin                     S Tetracycline                   R Vancomycin                     R   Microscopic Examination     Status: Abnormal   Collection Time: 12/24/20 11:04 AM   Urine  Result Value Ref Range Status   WBC, UA 6-10 (A) 0 - 5 /hpf Final   RBC 3-10 (A) 0 - 2 /hpf Final   Epithelial Cells (non renal) 0-10 0 - 10 /hpf Final   Crystals Present (A) N/A Final   Crystal Type Amorphous Sediment N/A Final   Bacteria, UA Few None seen/Few Final  Urine culture     Status: Abnormal   Collection Time: 12/28/20  5:01 PM   Specimen: Urine, Random  Result Value Ref Range Status   Specimen Description   Final    URINE, RANDOM Performed at [facility], [address],  [place], [place] [address]    Special Requests   Final    NONE Performed at [facility], [address], [place],  [address]    Culture (A)  Final    >=100,000 COLONIES/mL PSEUDOMONAS AERUGINOSA >=100,000 COLONIES/mL VANCOMYCIN RESISTANT ENTEROCOCCUS    Report Status 12/31/2020 FINAL  Final   Organism ID, Bacteria PSEUDOMONAS AERUGINOSA (A)  Final   Organism ID, Bacteria VANCOMYCIN RESISTANT ENTEROCOCCUS (A)  Final      Susceptibility   Pseudomonas aeruginosa - MIC*    CEFTAZIDIME 4 SENSITIVE Sensitive     CIPROFLOXACIN <=0.25 SENSITIVE Sensitive     GENTAMICIN <=1 SENSITIVE Sensitive     IMIPENEM 2 SENSITIVE Sensitive     CEFEPIME 2 SENSITIVE Sensitive     * >=100,000 COLONIES/mL PSEUDOMONAS AERUGINOSA   Vancomycin resistant enterococcus - MIC*    AMPICILLIN <=2 SENSITIVE Sensitive     NITROFURANTOIN <=16 SENSITIVE Sensitive     VANCOMYCIN >=32 RESISTANT Resistant     LINEZOLID 2 SENSITIVE Sensitive     * >=100,000 COLONIES/mL VANCOMYCIN RESISTANT ENTEROCOCCUS  Resp Panel by RT-PCR (Flu A&B, Covid) Nasopharyngeal Swab     Status: None   Collection Time: 12/28/20  5:02 PM   Specimen: Nasopharyngeal Swab; Nasopharyngeal(NP) swabs in vial transport medium  Result Value Ref Range Status   SARS Coronavirus 2 by RT PCR NEGATIVE NEGATIVE Final    Comment: (NOTE) SARS-CoV-2 target nucleic acids are NOT DETECTED.  The SARS-CoV-2 RNA is generally detectable in upper respiratory specimens during the acute phase of infection. The  lowest concentration of SARS-CoV-2 viral copies this assay can detect is 138 copies/mL. A negative result does not preclude SARS-Cov-2 infection and should not be used as the sole basis for treatment or other patient management decisions. A negative result may occur with  improper specimen collection/handling, submission of specimen other than nasopharyngeal swab, presence of viral mutation(s) within the areas targeted by this assay, and  inadequate number of viral copies(<138 copies/mL). A negative result must be combined with clinical observations, patient history, and epidemiological information. The expected result is Negative.  Fact Sheet for Patients:  EntrepreneurPulse.com.au  Fact Sheet for Healthcare Providers:  IncredibleEmployment.be  This test is no t yet approved or cleared by the [place] FDA and  has been authorized for detection and/or diagnosis of SARS-CoV-2 by FDA under an Emergency Use Authorization (EUA). This EUA will remain  in effect (meaning this test can be used) for the duration of the COVID-19 declaration under Section 564(b)(1) of the Act, 21 U.S.C.section 360bbb-3(b)(1), unless the authorization is terminated  or revoked sooner.       Influenza A by PCR NEGATIVE NEGATIVE Final   Influenza B by PCR NEGATIVE NEGATIVE Final    Comment: (NOTE) The Xpert Xpress SARS-CoV-2/FLU/RSV plus assay is intended as an aid in the diagnosis of influenza from Nasopharyngeal swab specimens and should not be used as a sole basis for treatment. Nasal washings and aspirates are unacceptable for Xpert Xpress SARS-CoV-2/FLU/RSV testing.  Fact Sheet for Patients: EntrepreneurPulse.com.au  Fact Sheet for Healthcare Providers: IncredibleEmployment.be  This test is not yet approved or cleared by the [place] FDA and has been authorized for detection and/or diagnosis of SARS-CoV-2 by FDA under an Emergency Use Authorization (EUA). This EUA will remain in effect (meaning this test can be used) for the duration of the COVID-19 declaration under Section 564(b)(1) of the Act, 21 U.S.C. section 360bbb-3(b)(1), unless the authorization is terminated or revoked.  Performed at [facility], [address] [place], [place] [address]     Radiology Reports MR BRAIN W WO CONTRAST  Result Date: 12/29/2020 CLINICAL DATA:   Lung cancer with brain metastases. Status post whole brain radiation. EXAM: MRI HEAD WITHOUT AND WITH CONTRAST TECHNIQUE: Multiplanar, multiecho pulse sequences of the brain and surrounding structures were obtained without and with intravenous contrast. CONTRAST:  48mL GADAVIST GADOBUTROL 1 MMOL/ML IV SOLN COMPARISON:  11/14/2020 FINDINGS: Brain: The enhancing lesion anteriorly in the left temporal lobe has decreased in size, now measuring 14 mm (series 19, image 58, previously 19 mm) with decreased edema. A 3 mm focus of enhancement slightly more superiorly and laterally in the left temporal lobe (series 19, image 70), a 6 mm enhancing lesion in the anterior midline of the midbrain (series 19, image 76), and an 11 mm enhancing lesion in the anterior left frontal lobe (series 19, image 82) are all unchanged. No new enhancing brain lesion is identified. Multiple areas of chronic hemorrhage are again noted in both cerebral hemispheres including chronic hemorrhage associated with the left temporal and left frontal lesions as well as hemosiderin stained encephalomalacia in the parieto-occipital regions bilaterally. There are unchanged small chronic right frontal infarcts. Outside of the left temporal lobe, confluent T2 hyperintensities in the cerebral white matter bilaterally are stable to minimally increased and reflect in part post radiation changes. No midline shift, acute infarct, or extra-axial fluid collection is identified. 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 There is moderate cerebral atrophy. Vascular: Major intracranial vascular flow voids are preserved. Skull  and upper cervical spine: No suspicious marrow lesion. Sinuses/Orbits: Unremarkable orbits. Left maxillary sinus mucous retention cyst. Persistent large right mastoid effusion. Other: None. IMPRESSION: 1. Decreased size of left temporal lobe metastasis with decreased edema. 2. Unchanged size of other lesions. 3. No evidence of new  intracranial metastases or acute abnormality. Electronically Signed   By: Logan Bores M.D.   On: 12/29/2020 17:38   US Venous Img Lower Bilateral  Result Date: 12/28/2020 CLINICAL DATA:  Leg swelling for 1 week. Patient has a history of lung malignancy and prior DVT. EXAM: BILATERAL LOWER EXTREMITY VENOUS DOPPLER ULTRASOUND TECHNIQUE: Gray-scale sonography with graded compression, as well as color Doppler and duplex ultrasound were performed to evaluate the lower extremity deep venous systems from the level of the common femoral vein and including the common femoral, femoral, profunda femoral, popliteal and calf veins including the posterior tibial, peroneal and gastrocnemius veins when visible. The superficial great saphenous vein was also interrogated. Spectral Doppler was utilized to evaluate flow at rest and with distal augmentation maneuvers in the common femoral, femoral and popliteal veins. COMPARISON:  Lower extremity ultrasound dated 06/03/2016. FINDINGS: RIGHT LOWER EXTREMITY Common Femoral Vein: No evidence of thrombus. Normal compressibility, respiratory phasicity and response to augmentation. Saphenofemoral Junction: No evidence of thrombus. Normal compressibility and flow on color Doppler imaging. Profunda Femoral Vein: No evidence of thrombus. Normal compressibility and flow on color Doppler imaging. Femoral Vein: No evidence of thrombus. Normal compressibility, respiratory phasicity and response to augmentation. Popliteal Vein: No evidence of thrombus. Normal compressibility, respiratory phasicity and response to augmentation. Calf Veins: Thrombus is noted in the posterior tibial vein. Superficial Great Saphenous Vein: No evidence of thrombus. Normal compressibility. Venous Reflux:  None. Other Findings:  None. LEFT LOWER EXTREMITY Common Femoral Vein: No evidence of thrombus. Normal compressibility, respiratory phasicity and response to augmentation. Saphenofemoral Junction: No evidence of  thrombus. Normal compressibility and flow on color Doppler imaging. Profunda Femoral Vein: No evidence of thrombus. Normal compressibility and flow on color Doppler imaging. Femoral Vein: No evidence of thrombus. Normal compressibility, respiratory phasicity and response to augmentation. Popliteal Vein: No evidence of thrombus. Normal compressibility, respiratory phasicity and response to augmentation. Calf Veins: Thrombus is noted in the posterior tibial vein. Superficial Great Saphenous Vein: No evidence of thrombus. Normal compressibility. Venous Reflux:  None. Other Findings:  None. IMPRESSION: Occlusive thrombus in the bilateral posterior tibial veins. Electronically Signed   By: Zerita Boers M.D.   On: 12/28/2020 18:57   DG Chest Portable 1 View  Result Date: 12/28/2020 CLINICAL DATA:  Shortness of breath. EXAM: PORTABLE CHEST 1 VIEW COMPARISON:  August 08, 2018 FINDINGS: Post treatment changes are seen in the left upper lobe. The heart, hila, mediastinum, lungs, and pleura are otherwise unremarkable. IMPRESSION: No acute interval change. Electronically Signed   By: Dorise Bullion III M.D   On: 12/28/2020 17:51

## 2020-12-31 NOTE — Care Management Important Message (Signed)
Important Message  Patient Details  Name: Melvin Willis MRN: [id number] Date of Birth: 1951/04/25   Medicare Important Message Given:  N/A - LOS <3 / Initial given by admissions     [name] 12/31/2020, 7:05 AM

## 2020-12-31 NOTE — Progress Notes (Addendum)
Speech Language Pathology Treatment: Dysphagia  Patient Details Name: Melvin Willis MRN: [id number] DOB: 04/14/1951 Today's Date: 12/31/2020 Time: 7408-1448 SLP Time Calculation (min) (ACUTE ONLY): 42 min  Assessment / Plan / Recommendation Clinical Impression  Pt seen for ongoing assessment of swallowing. He appeared min drowsy when awaking but alertness improved after positioning upright in bed. Noted pt had Not eaten his breakfast tray. He was verbally responsive and able to follow instructions w/ Mod cues; accuracy during verbal responses was intermittent, reduced. Speech was often mumbled. Pt is on RA; wbc wnl.  Pt was only Alert to self, place. He was not sure which meal he should have eaten or what meal was next. He was Not Alert to the day or why he had to come to the Hospital. He could not give me his Wife's phone number when calling her after session. 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 Recommend f/u s/p D/C for any further Cognitive functioning needs to improve engagement in ADLs.  Pt explained general aspiration precautions and agreed verbally to the need for following them especially sitting upright for all oral intake. Pt assisted w/ positioning Upright d/t weakness w/ Mod+ cues required to assist. He was then given trials of thin liquids and soft solids w/ no overt clinical s/s of aspiration were noted w/ any consistency; respiratory status remained calm and unlabored, vocal quality clear b/t trials. Pt helped to hold Cup when drinking following instructions for single, small sips slowly. Min+ support given d/t pt missing his mouth when self-feeding. Oral phase appeared grossly [facility] for bolus management and timely mastication of the  solid food trials. Encouraged pt to alternate b/t foods and liquids to aid oral clearing. Oral clearing achieved w/ all consistencies.   Recommend continue current Regular diet for ease of choices w/ mech soft and gravies added; Thin liquids. Recommend general aspiration precautions; Pills Whole in Puree for ease of swalowing; tray setup and positioning assistance for meals w/ Feeding Assistance as needed d/t overall weakness. Recommend Dietician f/u for nutritional support as needed. ST services can be reconsulted if new  needs arise while admitted. Suspect pt could benefit from Support w/ feeding at meal times d/t overall weakness and to declined Cognitive awareness. Order placed for this in chart. NSG/Dining updated. Precautions posted at bedside.     HPI HPI: Per admitting H&P,  "Melvin Willis is a 70 y.o. 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     Patient alert and oriented only to self and place.  He is unable to provide any history and wife is not at bedside.  Per ED documentation, patient has been having difficulty with urine coming out of his Foley catheter.  He has planned procedure with urology on his prostate next week.  They also notes increase swelling in his legs that have worsened in the past week.     Foley was replaced in the ED with UA showing UTI. Pt was started on ceftriaxone.  Venous Doppler ultrasound was positive for occlusive  thrombus of bilateral lower posterior tibial veins.  EDP discussed with vascular surgery Dr.Albrecht who would be able to place IVC filter if recommended by oncology.  Oncology Dr. Janese Banks was notified and recommends IVC filter and will discuss  anticoagulation with his oncologist.".    MRI of Brain: "Multiple areas of chronic hemorrhage are again noted in both  cerebral hemispheres including chronic hemorrhage associated with  the left temporal and left frontal lesions as well as hemosiderin  stained encephalomalacia in the parieto-occipital regions  bilaterally. There are unchanged small chronic right frontal  infarcts. Outside of the left temporal lobe, confluent T2  hyperintensities in the cerebral white matter bilaterally are stable  to minimally increased and reflect in part post radiation changes.  The enhancing lesion anteriorly in the left temporal lobe has  decreased in size, now measuring 14 mm (series 19, image 58,  previously 19 mm) with decreased edema.  No midline shift, acute infarct, or extra-axial fluid collection is  identified. 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  There is moderate cerebral atrophy.".      SLP Plan  All goals met       Recommendations  Diet recommendations: Regular;Thin liquid (meats cut) Liquids provided via: Cup;Straw Medication Administration: Whole meds with puree (for safer swallowing) Supervision: Patient able to self feed;Staff to assist with self feeding;Intermittent supervision to cue for compensatory strategies Compensations: Minimize environmental distractions;Slow rate;Small sips/bites;Lingual sweep for clearance of pocketing;Follow solids with liquid Postural Changes and/or Swallow Maneuvers: Out of bed for meals;Seated upright 90 degrees;Upright 30-60 min after meal                General recommendations:  (Dietician f/u) Oral Care Recommendations: Oral care BID;Oral care before and after PO;Staff/trained caregiver to provide oral care Follow up Recommendations: None SLP Visit Diagnosis: Dysphagia, unspecified (R13.10) Plan: All goals met       GO                 Orinda Kenner, MS, CCC-SLP Speech Language Pathologist Rehab  Services [phone] [facility] 12/31/2020, 2:01 PM

## 2020-12-31 NOTE — Progress Notes (Addendum)
Pt  Is climbing  OOB Pt is very confused and disoriented. Reoriented multiple times and was unsuccessful.  Medicated with melatonin for sleep. Wife Lattie Haw called to speak with pt.Will continue to monitor.   At 2100, pt continues to climb OOB. Transferred to 107 for safety. Wife Lattie Haw notified.   At 2230, pt is very agitated. Wanted to go home. Very confused and disoriented. Medicated with IV ativan as ordered. NP on call notified. Family notified. Daughter will come to sit with pt.

## 2020-12-31 NOTE — Progress Notes (Signed)
Patient's BP is 92/47, map of 62. HR of 56. Pt is sleeping soundly. Current IVF infusing. NP on call notified.

## 2021-01-01 ENCOUNTER — Encounter: Payer: Self-pay | Admitting: Internal Medicine

## 2021-01-01 ENCOUNTER — Other Ambulatory Visit: Payer: Self-pay | Admitting: Urology

## 2021-01-01 DIAGNOSIS — E785 Hyperlipidemia, unspecified: Secondary | ICD-10-CM

## 2021-01-01 DIAGNOSIS — E1122 Type 2 diabetes mellitus with diabetic chronic kidney disease: Secondary | ICD-10-CM

## 2021-01-01 DIAGNOSIS — R339 Retention of urine, unspecified: Secondary | ICD-10-CM

## 2021-01-01 DIAGNOSIS — R338 Other retention of urine: Secondary | ICD-10-CM

## 2021-01-01 DIAGNOSIS — N1831 Chronic kidney disease, stage 3a: Secondary | ICD-10-CM

## 2021-01-01 DIAGNOSIS — N179 Acute kidney failure, unspecified: Secondary | ICD-10-CM

## 2021-01-01 DIAGNOSIS — I824Y9 Acute embolism and thrombosis of unspecified deep veins of unspecified proximal lower extremity: Secondary | ICD-10-CM

## 2021-01-01 LAB — CBC
HCT: 37.1 % — ABNORMAL LOW (ref 39.0–52.0)
Hemoglobin: 12.4 g/dL — ABNORMAL LOW (ref 13.0–17.0)
MCH: 30.1 pg (ref 26.0–34.0)
MCHC: 33.4 g/dL (ref 30.0–36.0)
MCV: 90 fL (ref 80.0–100.0)
Platelets: 146 10*3/uL — ABNORMAL LOW (ref 150–400)
RBC: 4.12 MIL/uL — ABNORMAL LOW (ref 4.22–5.81)
RDW: 14.9 % (ref 11.5–15.5)
WBC: 4.4 10*3/uL (ref 4.0–10.5)
nRBC: 0 % (ref 0.0–0.2)

## 2021-01-01 LAB — BASIC METABOLIC PANEL
Anion gap: 6 (ref 5–15)
BUN: 13 mg/dL (ref 8–23)
CO2: 23 mmol/L (ref 22–32)
Calcium: 8.9 mg/dL (ref 8.9–10.3)
Chloride: 110 mmol/L (ref 98–111)
Creatinine, Ser: 1.06 mg/dL (ref 0.61–1.24)
GFR, Estimated: >60 mL/min (ref >60)
Glucose, Bld: 147 mg/dL — ABNORMAL HIGH (ref 70–99)
Potassium: 3.9 mmol/L (ref 3.5–5.1)
Sodium: 139 mmol/L (ref 135–145)

## 2021-01-01 LAB — GLUCOSE, CAPILLARY
Glucose-Capillary: 124 mg/dL — ABNORMAL HIGH (ref 70–99)
Glucose-Capillary: 229 mg/dL — ABNORMAL HIGH (ref 70–99)

## 2021-01-01 MED ORDER — INSULIN GLARGINE 100 UNIT/ML ~~LOC~~ SOLN
14.0000 [IU] | Freq: Every day | SUBCUTANEOUS | Status: DC
  Administered 2021-01-01: 10:00:00 14 [IU] via SUBCUTANEOUS
  Filled 2021-01-01 (×2): qty 0.14

## 2021-01-01 MED ORDER — LORLATINIB 100 MG PO TABS: 100.0000 mg | ORAL_TABLET | Freq: Every day | ORAL | 6 refills | Status: DC

## 2021-01-01 MED ORDER — LEVETIRACETAM 500 MG PO TABS
500.0000 mg | ORAL_TABLET | Freq: Two times a day (BID) | ORAL | Status: DC
  Administered 2021-01-01: 10:00:00 500 mg via ORAL
  Filled 2021-01-01 (×2): qty 1

## 2021-01-01 MED ORDER — MAGIC MOUTHWASH: 5.0000 mL | Freq: Four times a day (QID) | ORAL | 0 refills | Status: AC

## 2021-01-01 MED ORDER — CIPROFLOXACIN HCL 500 MG PO TABS: 500.0000 mg | ORAL_TABLET | Freq: Two times a day (BID) | ORAL | 0 refills | Status: AC

## 2021-01-01 MED ORDER — AMOXICILLIN 500 MG PO CAPS: 500.0000 mg | ORAL_CAPSULE | Freq: Three times a day (TID) | ORAL | 0 refills | Status: AC

## 2021-01-01 MED ORDER — APIXABAN 2.5 MG PO TABS: 2.5000 mg | ORAL_TABLET | Freq: Two times a day (BID) | ORAL | 1 refills | Status: DC

## 2021-01-01 NOTE — Progress Notes (Signed)
Melvin Willis   DOB:1951-08-25   TM#:[id number]    Subjective: Patient denies any worsening headaches.  Denies any nausea vomiting.  Patient however is intermittently confused.  Denies any worsening swelling of the legs.   Objective:  Vitals:   01/01/21 0850 01/01/21 1145  BP: 133/80 (!) 144/85  Pulse: 82 80  Resp: 16 18  Temp: 98.8 F (37.1 C) 98.2 F (36.8 C)  SpO2: 94% 100%     Intake/Output Summary (Last 24 hours) at 01/01/2021 1336 Last data filed at 01/01/2021 1142 Gross per 24 hour  Intake 1255.6 ml  Output 2395 ml  Net -1139.4 ml    Physical Exam Constitutional:      Comments: Alone.  Resting in the bed.  HENT:     Head: Normocephalic and atraumatic.     Mouth/Throat:     Pharynx: No oropharyngeal exudate.  Eyes:     Pupils: Pupils are equal, round, and reactive to light.  Cardiovascular:     Rate and Rhythm: Normal rate and regular rhythm.  Pulmonary:     Effort: No respiratory distress.     Breath sounds: No wheezing.     Comments: Decreased breath sounds in the bases. Abdominal:     General: Bowel sounds are normal. There is no distension.     Palpations: Abdomen is soft. There is no mass.     Tenderness: There is no abdominal tenderness. There is no guarding or rebound.  Musculoskeletal:        General: No tenderness. Normal range of motion.     Cervical back: Normal range of motion and neck supple.  Skin:    General: Skin is warm.  Neurological:     Mental Status: He is alert.     Comments: Oriented x2-3.  Psychiatric:        Mood and Affect: Affect normal.      Labs:  Lab Results  Component Value Date   WBC 4.4 01/01/2021   HGB 12.4 (L) 01/01/2021   HCT 37.1 (L) 01/01/2021   MCV 90.0 01/01/2021   PLT 146 (L) 01/01/2021   NEUTROABS 4.7 12/28/2020    Lab Results  Component Value Date   NA 139 01/01/2021   K 3.9 01/01/2021   CL 110 01/01/2021   CO2 23 01/01/2021    Studies:  No results found.  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  Discontinue lorlatinib-given acute issues; STABLE.  # Brain metastases s/p whole brain radiation [finished March 2022]-reviewed brain MRI this admission-partial response; no worsening disease or worsening hemorrhagic disease.  Currently started on anticoagulation with Eliquis [at lower dose]-2.5 mg twice daily.  Continue Keppra.  #History of PE/DVT; with current acute bilateral below the knee DVT s/p IVC filter-started Eliquis 2.5 mg twice daily; however will need to stop prior to HOLEP.  We will increase the dose of Eliquis post HOLEP procedure.  #Urinary retention s/p Foley catheter; UTI-on broad-spectrum antibiotics as per primary team.  Discussed with Dr.Sinikski-plan to reschedule HOLEP-next week until after treatment of UTI.  #Recommend ambulation/physical therapy-patient clinically stable to be discharged home from hematology oncology standpoint.  Cammie Sickle, MD 01/01/2021  1:36 PM

## 2021-01-01 NOTE — Progress Notes (Signed)
PHARMACIST - PHYSICIAN COMMUNICATION  DR:   Grandville Silos  CONCERNING: IV to Oral Route Change Policy  RECOMMENDATION: This patient is receiving levitiracetam by the intravenous route.    Based on criteria approved by the Pharmacy and Therapeutics Committee, the intravenous medication(s) is/are being converted to the equivalent oral dose form(s).   DESCRIPTION: These criteria include:  The patient is eating (either orally or via tube) and/or has been taking other orally administered medications for a least 24 hours  The patient has no evidence of active gastrointestinal bleeding or impaired GI absorption (gastrectomy, short bowel, patient on TNA or NPO).  If you have questions about this conversion, please contact the Pharmacy Department  []   [phone] )  [facility] [x]   [phone] )  [facility] []   [phone] )  [facility] []   [phone] )  [facility] []   [phone] )  [place], PharmD, BCPS Clinical Pharmacist 01/01/2021 7:52 AM

## 2021-01-01 NOTE — Progress Notes (Signed)
Alert and oriented to person and place. Disoriented to time and situation but easily re-oriented. Family visiting at begining of shift. No s/s of pain or discomfort. IV fluids and antibiotics infused and tolerated well. Bed alarm in use. Pt calm and pleasant. Continue plan of care.

## 2021-01-01 NOTE — TOC Transition Note (Signed)
Transition of Care [facility]) - CM/SW Discharge Note   Patient Details  Name: Melvin Willis MRN: [id number] Date of Birth: [birth year]  [facility] CM/SW Contact:  Shelbie Hutching, RN Phone Number: 01/01/2021, 3:44 PM   Clinical Narrative:    Patient medically cleared for discharge home with home health services.  Patient is active with [facility], new orders for RN, PT, and OT have been placed.  Corene Cornea with [facility] is aware of discharge today.    Final next level of care: [place] Barriers to Discharge: No Barriers Identified   Patient Goals and CMS Choice Patient states their goals for this hospitalization and ongoing recovery are:: Patient very sleepy this morning, unable to state goals CMS Medicare.gov Compare Post Acute Care list provided to:: Patient Choice offered to / list presented to : Patient  Discharge Placement                       Discharge Plan and Services   Discharge Planning Services: CM Consult Post Acute Care Choice: Home Health          DME Arranged: N/A DME Agency: NA       HH Arranged: RN,PT,OT [place] Agency: [place] ([facility]) Date [place]: 01/01/21 Time [place]: [phone] Representative spoke with at Accident: [place] ([place]) Interventions     Readmission Risk Interventions No flowsheet data found.

## 2021-01-01 NOTE — Discharge Instructions (Signed)
Acute Urinary Retention, Male  Acute urinary retention is when a person cannot pee (urinate) at all, or can only pee a little. This can come on all of a sudden. If it is not treated, it can lead to kidney problems or other serious problems. What are the causes?  A problem with the tube that drains the bladder (urethra).  Problems with the nerves in the bladder.  Tumors.  Certain medicines.  An infection.  Having trouble pooping (constipation). What increases the risk? Older men are more at risk because their prostate gland may become larger as they age. Other conditions also can increase risk. These include:  Diseases, such as multiple sclerosis.  Injury to the spinal cord.  Diabetes.  A condition that affects the way the brain works, such as dementia.  Holding back urine due to trauma or because you do not want to use the bathroom. What are the signs or symptoms?  Trouble peeing.  Pain in the lower belly. How is this treated? Treatment for this condition may include:  Medicines.  Placing a thin, germ-free tube (catheter) into the bladder to drain pee out of the body.  Therapy to treat mental health conditions.  Treatment for conditions that may cause this. If needed, you may be treated in the hospital for kidney problems or to manage other problems. Follow these instructions at home: Medicines  Take over-the-counter and prescription medicines only as told by your doctor. Ask your doctor what medicines you should stay away from.  If you were given an antibiotic medicine, take it as told by your doctor. Do not stop taking it, even if you start to feel better. General instructions  Do not smoke or use any products that contain nicotine or tobacco. If you need help quitting, ask your doctor.  Drink enough fluid to keep your pee pale yellow.  If you were sent home with a tube that drains the bladder, take care of it as told by your doctor.  Watch for changes  in your symptoms. Tell your doctor about them.  If told, keep track of changes in your blood pressure at home. Tell your doctor about them.  Keep all follow-up visits. Contact a doctor if:  You have spasms in your bladder that you cannot stop.  You leak pee when you have spasms. Get help right away if:  You have chills or a fever.  You have blood in your pee.  You have a tube that drains pee from the bladder and these things happen: ? The tube stops draining pee. ? The tube falls out. Summary  Acute urinary retention is when you cannot pee at all or you pee too little.  If this condition is not treated, it can lead to kidney problems or other serious problems.  If you were sent home with a tube (catheter) that drains the bladder, take care of it as told by your doctor.  Watch for changes in your symptoms. Tell your doctor about them. This information is not intended to replace advice given to you by your health care provider. Make sure you discuss any questions you have with your health care provider. Document Revised: 05/15/2020 Document Reviewed: 05/15/2020 Elsevier Patient Education  2021 [employer].   Acute Urinary Retention, Male  Acute urinary retention is a condition in which a person is unable to pass urine or can only pass a little urine. This condition can happen suddenly and last for a short time. If left untreated, it  can become long-term (chronic) and result in kidney damage or other serious complications. What are the causes? This condition may be caused by:  Obstruction or narrowing of the tube that drains the bladder (urethra). This may be caused by surgery, problems with nearby organs, or injury to the bladder or urethra.  Problems with the nerves in the bladder.  Tumors in the area of the pelvis, bladder, or urethra.  Certain medicines.  Bladder or urinary tract infection.  Constipation. What increases the risk? This condition is more likely to  develop in older men. As men age, their prostate may become larger and may start to press or squeeze on the bladder or the urethra. Other chronic health conditions can increase the risk of acute urinary retention. These include:  Diseases such as multiple sclerosis.  Spinal cord injuries.  Diabetes.  Degenerative cognitive conditions, such as delirium or dementia.  Psychological conditions. A man may hold his urine due to trauma or because he does not want to use the bathroom. What are the signs or symptoms? Symptoms of this condition include:  Trouble urinating.  Pain in the lower abdomen. How is this diagnosed? This condition is diagnosed based on a physical exam and your medical history. You may also have other tests, including:  An ultrasound of the bladder or kidneys or both.  Blood tests.  A urine analysis.  Additional tests may be needed, such as a CT scan, MRI, and kidney or bladder function tests. How is this treated? Treatment for this condition may include:  Medicines.  Placing a thin, sterile tube (catheter) into the bladder to drain urine out of the body. This is called an indwelling urinary catheter. After it is inserted, the catheter is held in place with a small balloon that is filled with sterile water. Urine drains from the catheter into a collection bag outside of the body.  Behavioral therapy.  Treatment for other conditions. If needed, you may be treated in the hospital for kidney function problems or to manage other complications. Follow these instructions at home: Medicines  Take over-the-counter and prescription medicines only as told by your health care provider. Avoid certain medicines, such as decongestants, antihistamines, and some prescription medicines. Do not take any medicine unless your health care provider approves.  If you were prescribed an antibiotic medicine, take it as told by your health care provider. Do not stop using the antibiotic  even if you start to feel better. General instructions  Do not use any products that contain nicotine or tobacco. These products include cigarettes, chewing tobacco, and vaping devices, such as e-cigarettes. If you need help quitting, ask your health care provider.  Drink enough fluid to keep your urine pale yellow.  If you have an indwelling urinary catheter, follow the instructions from your health care provider.  Monitor any changes in your symptoms. Tell your health care provider about any changes.  If instructed, monitor your blood pressure at home. Report changes as told by your health care provider.  Keep all follow-up visits. This is important. Contact a health care provider if:  You have uncomfortable bladder contractions that you cannot control (spasms).  You leak urine with the spasms. Get help right away if:  You have chills or a fever.  You have blood in your urine.  You have a catheter and the following happens: ? Your catheter stops draining urine. ? Your catheter falls out. Summary  Acute urinary retention is a condition in which a person is  unable to pass urine or can only pass a little urine. If left untreated, this condition can result in kidney damage or other serious complications.  An enlarged prostate may cause this condition. As men age, their prostate gland may become larger and may press or squeeze on the bladder or the urethra.  Treatment for this condition may include medicines and placement of an indwelling urinary catheter.  Monitor any changes in your symptoms. Tell your health care provider about any changes. This information is not intended to replace advice given to you by your health care provider. Make sure you discuss any questions you have with your health care provider. Document Revised: 05/15/2020 Document Reviewed: 05/15/2020 Elsevier Patient Education  2021 [place] on my medicine - ELIQUIS (apixaban)  This  medication education was reviewed with me or my healthcare representative as part of my discharge preparation.    Why was Eliquis prescribed for you? Eliquis was prescribed to treat blood clots that may have been found in the veins of your legs (deep vein thrombosis) or in your lungs (pulmonary embolism) and to reduce the risk of them occurring again.  What do You need to know about Eliquis ?  Try to take the dose about the same time in the morning and in the evening. If you have difficulty swallowing the tablet whole please discuss with your pharmacist how to take the medication safely.  Take Eliquis exactly as prescribed and DO NOT stop taking Eliquis without talking to the doctor who prescribed the medication.  Stopping may increase your risk of developing a new blood clot.  Refill your prescription before you run out.  After discharge, you should have regular check-up appointments with your healthcare provider that is prescribing your Eliquis.    What do you do if you miss a dose? If a dose of ELIQUIS is not taken at the scheduled time, take it as soon as possible on the same day and twice-daily administration should be resumed. The dose should not be doubled to make up for a missed dose.  Important Safety Information A possible side effect of Eliquis is bleeding. You should call your healthcare provider right away if you experience any of the following: ? Bleeding from an injury or your nose that does not stop. ? Unusual colored urine (red or dark brown) or unusual colored stools (red or black). ? Unusual bruising for unknown reasons. ? A serious fall or if you hit your head (even if there is no bleeding).  Some medicines may interact with Eliquis and might increase your risk of bleeding or clotting while on Eliquis. To help avoid this, consult your healthcare provider or pharmacist prior to using any new prescription or non-prescription medications, including herbals, vitamins,  non-steroidal anti-inflammatory drugs (NSAIDs) and supplements.  This website has more information on Eliquis (apixaban): http://www.eliquis.com/eliquis/home

## 2021-01-01 NOTE — Progress Notes (Signed)
Melvin Willis and Alvina Filbert received discharge education regarding diagnosis, medication regimen, and follow up care. Patient and wife verbalized understanding of all education.Patient left with foley in place, IV was removed. Patient left with all personal belongings including iphone, clothing, and shoes.

## 2021-01-01 NOTE — Discharge Summary (Signed)
Physician Discharge Summary  Melvin Willis:[id number] DOB: July 29, 1951 DOA: 12/28/2020  PCP: [name], MD  Admit date: 12/28/2020 Discharge date: 01/01/2021  Time spent: 60 minutes  Recommendations for Outpatient Follow-up:  1. Follow-up with Eulogio Ditch, NP vascular surgery in 2 months. 2. Follow-up with [name], MD in 2 weeks.  On follow-up patient need a basic metabolic profile done to follow-up on electrolytes and renal function.  Patient's diabetes will need to be followed up upon 3. Follow-up with Dr. Rogue Bussing, oncologist as scheduled.  Further recommendations on anticoagulation doses will be recommended at this time. 4. Follow-up with Dr. Diamantina Providence, urology as scheduled.  On follow-up urology to determine whether patient needs ongoing antibiotic treatment peri and postprocedure.   Discharge Diagnoses:  Principal Problem:   UTI (urinary tract infection) Active Problems:   Type 2 diabetes mellitus with stage 3 chronic kidney disease (HCC)   Dyslipidemia   Primary malignant neoplasm of lung metastatic to other site [facility])   Brain metastasis (HCC)   Acute-on-chronic kidney injury ([place])   Urinary retention   Leg DVT (deep venous thromboembolism), acute, bilateral (HCC)   Acute deep vein thrombosis (DVT) of tibial vein of both lower extremities (HCC)   Deep vein thrombosis (DVT) of proximal lower extremity ([place])   Discharge Condition: Stable and improved  Diet recommendation: Carb modified diet  Filed Weights   12/28/20 2213  Weight: 72.5 kg    History of present illness:  HPI per Dr. Pincus Large is a 70 y.o. 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  Patient alert and oriented only to self and place.  He was unable to provide any history and wife is not at bedside.  Per ED documentation, patient has been having difficulty with urine coming out of his Foley catheter.  He has planned procedure with urology on his prostate next week.  They also noted increase swelling in his legs that have worsened in the past week.  Foley was replaced in the ED with UA showing UTI. Pt was started on ceftriaxone.  Venous Doppler ultrasound was positive for occlusive thrombus of bilateral lower posterior tibial veins.  EDP discussed with vascular surgery Dr.Albrecht who would be able to place IVC filter if recommended by oncology.  Oncology Dr. Janese Banks was notified and recommends IVC filter and will discuss anticoagulation with his oncologist.    Hospital Course:  1 UTI with chronic urinary retention -Patient noted to have a history of BPH and history of urinary retention with Foley catheter placed by urology on 12/12/2020 with plans for HOLEP procedure 01/03/2021. -Patient had presented with generalized weakness urinary retention.  Foley catheter was changed out in the ED with good flow. -Urinalysis done on admission concerning for UTI and patient started empirically on IV Rocephin. -Urine cultures grew out VRE and Pseudomonas and antibiotics subsequently changed to IV Zosyn empirically -Dr.Elgergawy discussed case with ID, Dr. Ola Spurr regarding discharge regimen and was recommended patient be discharged on ciprofloxacin 500 mg twice daily and amoxicillin 500 mg 3 times daily until his anticipated urological procedure (01/10/2021), or even further if deemed necessary by urologist. -Patient also continued on home regimen of tamsulosin and finasteride -Patient improved clinically during the hospitalization and will  be discharged home on 10 days of ciprofloxacin and amoxicillin and further recommendations of antibiotics will be deferred to urology  at time of urological procedure. -Patient was discharged home in stable and improved condition.  2.  Bilateral lower extremity occlusive thrombus of the posterior tibial veins -Patient with a history of adenocarcinoma of the lung with brain mets. -Patient with prior history of PE and bilateral lower extremity DVT previously on Xarelto but held by oncology due to recent hemorrhagic metastases. -Due to worsening lower extremity DVT, and risk of full anticoagulation due to recent hemorrhagic brain metastases vascular surgery was consulted and IVC filter placed on 12/29/2020 in consultation with hematology/oncology. -MRI of the head done showed increasing brain mets with stable chronic hemorrhage. -Hematology/oncology follow the patient throughout the hospitalization and recommending transitioning to low-dose Eliquis which patient was started on at 2.5 mg twice daily. -Outpatient follow-up with hematology/oncology at which time they will determine when to increase dose of Eliquis post HOLEP procedure. -Patient follow-up with vascular surgery.  3.  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 -MRI brain done does not show any evidence of worsening brain metastases or hemorrhage. -Outpatient follow-up with oncology.  4.  Acute metabolic encephalopathy -Felt secondary to acute infection from UTI in the setting of underlying brain metastases. -Improved clinically during the hospitalization. -Outpatient follow-up.  5.  Acute on chronic kidney disease stage IIIa -Improved with hydration.  6.  Type 2 diabetes mellitus with hyperglycemia -Patient maintained on sliding scale insulin, low-dose Lantus added and maintained on gabapentin.  7.   Hyperlipidemia Patient maintained on home regimen statin.  Procedures:  Chest x-ray 12/28/2020  MRI brain 12/29/2020  Bilateral lower extremity Dopplers 12/28/2020  IVC filter insertion per Dr. Lang Snow, vascular surgery 12/29/2020  Consultations:  Hematology/oncology: Dr. Janese Banks 12/28/2020  Vascular surgery: Dr. Lang Snow 12/29/2020  Curb Sided ID: Dr. Ola Spurr  Discharge Exam: Vitals:   01/01/21 0850 01/01/21 1145  BP: 133/80 (!) 144/85  Pulse: 82 80  Resp: 16 18  Temp: 98.8 F (37.1 C) 98.2 F (36.8 C)  SpO2: 94% 100%    General: NAD Cardiovascular: RRR Respiratory: CTAB anterior lung fields  Discharge Instructions   Discharge Instructions    Diet Carb Modified   Complete by: As directed    Increase activity slowly   Complete by: As directed      Allergies as of 01/01/2021   No Known Allergies     Medication List    STOP taking these medications   sulfamethoxazole-trimethoprim 800-160 MG tablet Commonly known as: BACTRIM DS     TAKE these medications   amoxicillin 500 MG capsule Commonly known as: AMOXIL Take 1 capsule (500 mg total) by mouth every 8 (eight) hours for 10 days.   apixaban 2.5 MG Tabs tablet Commonly known as: ELIQUIS Take 1 tablet (2.5 mg total) by mouth 2 (two) times daily.   atorvastatin 40 MG tablet Commonly known as: LIPITOR Take 1 tablet (40 mg total) by mouth daily.   blood glucose meter kit and supplies Dispense based on patient and insurance preference (Accuchek Aviva Plus, Accuchek Nano). Use up to four times daily as directed. (FOR ICD-10 E10.9, E11.9).   ciprofloxacin 500 MG tablet Commonly known as: CIPRO Take 1 tablet (500 mg total) by mouth 2 (  two) times daily for 10 days.   finasteride 5 MG tablet Commonly known as: PROSCAR Take 1 tablet (5 mg total) by mouth daily.   gabapentin 300 MG capsule Commonly known as: NEURONTIN Take 1 capsule by mouth at bedtime   glipiZIDE 5 MG tablet Commonly known as:  Glucotrol Take 1 tablet (5 mg total) by mouth 2 (two) times daily before a meal.   Insulin Glargine-Lixisenatide 100-33 UNT-MCG/ML Sopn Inject 15 Units into the skin daily with lunch.   levETIRAcetam 500 MG tablet Commonly known as: Keppra Take 1 tablet (500 mg total) by mouth 2 (two) times daily.   lorlatinib 100 MG tablet Commonly known as: LORBRENA Take 1 tablet (100 mg total) by mouth daily. Swallow tablets whole. Do not chew, crush or split tablets. Start taking on: Jan 15, 2021 What changed: These instructions start on Jan 15, 2021. If you are unsure what to do until then, ask your doctor or other care provider.   magic mouthwash Soln Take 5 mLs by mouth 4 (four) times daily for 4 days.   Novofine Pen Needle 32G X 6 MM Misc Generic drug: Insulin Pen Needle 1 each by Does not apply route daily.   tamsulosin 0.4 MG Caps capsule Commonly known as: FLOMAX Take 1 capsule by mouth once daily in the morning What changed:   how much to take  how to take this  when to take this  additional instructions      No Known Allergies  Follow-up Information    Kris Hartmann, NP In 2 months.   Specialty: Vascular Surgery Why: Seen as consult. Has IVC filter. Will need bilateral lower extremity DVT study with visit. Contact information: [place] [address] [phone]                The results of significant diagnostics from this hospitalization (including imaging, microbiology, ancillary and laboratory) are listed below for reference.    Significant Diagnostic Studies: MR BRAIN W WO CONTRAST  Result Date: 12/29/2020 CLINICAL DATA:  Lung cancer with brain metastases. Status post whole brain radiation. EXAM: MRI HEAD WITHOUT AND WITH CONTRAST TECHNIQUE: Multiplanar, multiecho pulse sequences of the brain and surrounding structures were obtained without and with intravenous contrast. CONTRAST:  35mL GADAVIST GADOBUTROL 1 MMOL/ML IV SOLN COMPARISON:   11/14/2020 FINDINGS: Brain: The enhancing lesion anteriorly in the left temporal lobe has decreased in size, now measuring 14 mm (series 19, image 58, previously 19 mm) with decreased edema. A 3 mm focus of enhancement slightly more superiorly and laterally in the left temporal lobe (series 19, image 70), a 6 mm enhancing lesion in the anterior midline of the midbrain (series 19, image 76), and an 11 mm enhancing lesion in the anterior left frontal lobe (series 19, image 82) are all unchanged. No new enhancing brain lesion is identified. Multiple areas of chronic hemorrhage are again noted in both cerebral hemispheres including chronic hemorrhage associated with the left temporal and left frontal lesions as well as hemosiderin stained encephalomalacia in the parieto-occipital regions bilaterally. There are unchanged small chronic right frontal infarcts. Outside of the left temporal lobe, confluent T2 hyperintensities in the cerebral white matter bilaterally are stable to minimally increased and reflect in part post radiation changes. No midline shift, acute infarct, or extra-axial fluid collection is identified. 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 There is moderate cerebral atrophy. Vascular: Major intracranial vascular flow voids are preserved. Skull and upper cervical spine: No suspicious marrow lesion. Sinuses/Orbits:  Unremarkable orbits. Left maxillary sinus mucous retention cyst. Persistent large right mastoid effusion. Other: None. IMPRESSION: 1. Decreased size of left temporal lobe metastasis with decreased edema. 2. Unchanged size of other lesions. 3. No evidence of new intracranial metastases or acute abnormality. Electronically Signed   By: Logan Bores M.D.   On: 12/29/2020 17:38   US Venous Img Lower Bilateral  Result Date: 12/28/2020 CLINICAL DATA:  Leg swelling for 1 week. Patient has a history of lung malignancy and prior DVT. EXAM: BILATERAL LOWER EXTREMITY VENOUS DOPPLER  ULTRASOUND TECHNIQUE: Gray-scale sonography with graded compression, as well as color Doppler and duplex ultrasound were performed to evaluate the lower extremity deep venous systems from the level of the common femoral vein and including the common femoral, femoral, profunda femoral, popliteal and calf veins including the posterior tibial, peroneal and gastrocnemius veins when visible. The superficial great saphenous vein was also interrogated. Spectral Doppler was utilized to evaluate flow at rest and with distal augmentation maneuvers in the common femoral, femoral and popliteal veins. COMPARISON:  Lower extremity ultrasound dated 06/03/2016. FINDINGS: RIGHT LOWER EXTREMITY Common Femoral Vein: No evidence of thrombus. Normal compressibility, respiratory phasicity and response to augmentation. Saphenofemoral Junction: No evidence of thrombus. Normal compressibility and flow on color Doppler imaging. Profunda Femoral Vein: No evidence of thrombus. Normal compressibility and flow on color Doppler imaging. Femoral Vein: No evidence of thrombus. Normal compressibility, respiratory phasicity and response to augmentation. Popliteal Vein: No evidence of thrombus. Normal compressibility, respiratory phasicity and response to augmentation. Calf Veins: Thrombus is noted in the posterior tibial vein. Superficial Great Saphenous Vein: No evidence of thrombus. Normal compressibility. Venous Reflux:  None. Other Findings:  None. LEFT LOWER EXTREMITY Common Femoral Vein: No evidence of thrombus. Normal compressibility, respiratory phasicity and response to augmentation. Saphenofemoral Junction: No evidence of thrombus. Normal compressibility and flow on color Doppler imaging. Profunda Femoral Vein: No evidence of thrombus. Normal compressibility and flow on color Doppler imaging. Femoral Vein: No evidence of thrombus. Normal compressibility, respiratory phasicity and response to augmentation. Popliteal Vein: No evidence of  thrombus. Normal compressibility, respiratory phasicity and response to augmentation. Calf Veins: Thrombus is noted in the posterior tibial vein. Superficial Great Saphenous Vein: No evidence of thrombus. Normal compressibility. Venous Reflux:  None. Other Findings:  None. IMPRESSION: Occlusive thrombus in the bilateral posterior tibial veins. Electronically Signed   By: Zerita Boers M.D.   On: 12/28/2020 18:57   DG Chest Portable 1 View  Result Date: 12/28/2020 CLINICAL DATA:  Shortness of breath. EXAM: PORTABLE CHEST 1 VIEW COMPARISON:  August 08, 2018 FINDINGS: Post treatment changes are seen in the left upper lobe. The heart, hila, mediastinum, lungs, and pleura are otherwise unremarkable. IMPRESSION: No acute interval change. Electronically Signed   By: Dorise Bullion III M.D   On: 12/28/2020 17:51    Microbiology: Recent Results (from the past 240 hour(s))  CULTURE, URINE COMPREHENSIVE     Status: Abnormal   Collection Time: 12/24/20 11:04 AM   Specimen: Urine   UR  Result Value Ref Range Status   Urine Culture, Comprehensive Final report (A)  Final   Organism ID, Bacteria Comment (A)  Final    Comment: Vancomycin-resistant Enterococcus (Enterococcus faecalis) Greater than 100,000 colony forming units per mL    ANTIMICROBIAL SUSCEPTIBILITY Comment  Final    Comment:       ** S = Susceptible; I = Intermediate; R = Resistant **  P = Positive; N = Negative             MICS are expressed in micrograms per mL    Antibiotic                 RSLT#1    RSLT#2    RSLT#3    RSLT#4 Ciprofloxacin                  R Levofloxacin                   R Linezolid                      S Nitrofurantoin                 S Penicillin                     S Tetracycline                   R Vancomycin                     R   Microscopic Examination     Status: Abnormal   Collection Time: 12/24/20 11:04 AM   Urine  Result Value Ref Range Status   WBC, UA 6-10 (A) 0 - 5 /hpf Final    RBC 3-10 (A) 0 - 2 /hpf Final   Epithelial Cells (non renal) 0-10 0 - 10 /hpf Final   Crystals Present (A) N/A Final   Crystal Type Amorphous Sediment N/A Final   Bacteria, UA Few None seen/Few Final  Urine culture     Status: Abnormal   Collection Time: 12/28/20  5:01 PM   Specimen: Urine, Random  Result Value Ref Range Status   Specimen Description   Final    URINE, RANDOM Performed at [facility], [address], [place], [place] [address]    Special Requests   Final    NONE Performed at [facility], [address] [place], [place] [address]    Culture (A)  Final    >=100,000 COLONIES/mL PSEUDOMONAS AERUGINOSA >=100,000 COLONIES/mL VANCOMYCIN RESISTANT ENTEROCOCCUS    Report Status 12/31/2020 FINAL  Final   Organism ID, Bacteria PSEUDOMONAS AERUGINOSA (A)  Final   Organism ID, Bacteria VANCOMYCIN RESISTANT ENTEROCOCCUS (A)  Final      Susceptibility   Pseudomonas aeruginosa - MIC*    CEFTAZIDIME 4 SENSITIVE Sensitive     CIPROFLOXACIN <=0.25 SENSITIVE Sensitive     GENTAMICIN <=1 SENSITIVE Sensitive     IMIPENEM 2 SENSITIVE Sensitive     CEFEPIME 2 SENSITIVE Sensitive     * >=100,000 COLONIES/mL PSEUDOMONAS AERUGINOSA   Vancomycin resistant enterococcus - MIC*    AMPICILLIN <=2 SENSITIVE Sensitive     NITROFURANTOIN <=16 SENSITIVE Sensitive     VANCOMYCIN >=32 RESISTANT Resistant     LINEZOLID 2 SENSITIVE Sensitive     * >=100,000 COLONIES/mL VANCOMYCIN RESISTANT ENTEROCOCCUS  Resp Panel by RT-PCR (Flu A&B, Covid) Nasopharyngeal Swab     Status: None   Collection Time: 12/28/20  5:02 PM   Specimen: Nasopharyngeal Swab; Nasopharyngeal(NP) swabs in vial transport medium  Result Value Ref Range Status   SARS Coronavirus 2 by RT PCR NEGATIVE NEGATIVE Final    Comment: (NOTE) SARS-CoV-2 target nucleic acids are NOT DETECTED.  The SARS-CoV-2 RNA is generally detectable in upper respiratory specimens during the acute phase of infection. The  lowest concentration of SARS-CoV-2 viral copies this assay can detect is 138 copies/mL. A negative result does not preclude SARS-Cov-2 infection and should not be used as the sole basis for treatment or other patient management decisions. A negative result may occur with  improper specimen collection/handling, submission of specimen other than nasopharyngeal swab, presence of viral mutation(s) within the areas targeted by this assay, and inadequate number of viral copies(<138 copies/mL). A negative result must be combined with clinical observations, patient history, and epidemiological information. The expected result is Negative.  Fact Sheet for Patients:  EntrepreneurPulse.com.au  Fact Sheet for Healthcare Providers:  IncredibleEmployment.be  This test is no t yet approved or cleared by the [place] FDA and  has been authorized for detection and/or diagnosis of SARS-CoV-2 by FDA under an Emergency Use Authorization (EUA). This EUA will remain  in effect (meaning this test can be used) for the duration of the COVID-19 declaration under Section 564(b)(1) of the Act, 21 U.S.C.section 360bbb-3(b)(1), unless the authorization is terminated  or revoked sooner.       Influenza A by PCR NEGATIVE NEGATIVE Final   Influenza B by PCR NEGATIVE NEGATIVE Final    Comment: (NOTE) The Xpert Xpress SARS-CoV-2/FLU/RSV plus assay is intended as an aid in the diagnosis of influenza from Nasopharyngeal swab specimens and should not be used as a sole basis for treatment. Nasal washings and aspirates are unacceptable for Xpert Xpress SARS-CoV-2/FLU/RSV testing.  Fact Sheet for Patients: EntrepreneurPulse.com.au  Fact Sheet for Healthcare Providers: IncredibleEmployment.be  This test is not yet approved or cleared by the [place] FDA and has been authorized for detection and/or diagnosis of SARS-CoV-2 by FDA under  an Emergency Use Authorization (EUA). This EUA will remain in effect (meaning this test can be used) for the duration of the COVID-19 declaration under Section 564(b)(1) of the Act, 21 U.S.C. section 360bbb-3(b)(1), unless the authorization is terminated or revoked.  Performed at [facility] [address] [place], [place] [address]      Labs: Basic Metabolic Panel: Recent Labs  Lab 12/28/20 1702 12/29/20 0436 12/30/20 0444 12/31/20 0427 01/01/21 0341  NA 132* 139 137 139 139  K 4.1 3.5 5.0 3.5 3.9  CL 101 109 106 108 110  CO2 20* _0 GLUCOSE 368* 101* 209* 259* 147*  BUN _1 CREATININE 1.52* 1.16 1.21 1.28* 1.06  CALCIUM 10.1 9.3 9.5 8.9 8.9   Liver Function Tests: Recent Labs  Lab 12/27/20 1352 12/28/20 1702 12/30/20 0444  AST _2 ALT 41 38 27  ALKPHOS 65 67 55  BILITOT 0.5 0.7 0.6  PROT 6.5 7.2 6.2*  ALBUMIN 3.3* 3.7 2.8*   No results for input(s): LIPASE, AMYLASE in the last 168 hours. Recent Labs  Lab 12/30/20 0444  AMMONIA 16   CBC: Recent Labs  Lab 12/27/20 1352 12/28/20 1702 12/29/20 0436 12/30/20 0444 12/31/20 0427 01/01/21 0341  WBC 6.8 7.9 6.1 6.4 5.0 4.4  NEUTROABS 4.0 4.7  --   --   --   --   HGB 14.1 14.1 12.7* 13.4 12.6* 12.4*  HCT 41.5 42.0 36.7* 39.4 37.2* 37.1*  MCV 89.2 88.8 87.2 88.1 89.2 90.0  PLT 142* 166 143* 148* 154 146*   Cardiac Enzymes: No results for input(s): CKTOTAL, CKMB, CKMBINDEX, TROPONINI in the last 168 hours. BNP: BNP (last 3 results) Recent Labs    11/14/20 1453 12/28/20 1702  BNP 151.4* 38.3  ProBNP (last 3 results) No results for input(s): PROBNP in the last 8760 hours.  CBG: Recent Labs  Lab 12/31/20 1217 12/31/20 1802 12/31/20 2056 01/01/21 0851 01/01/21 1144  GLUCAP 244* 277* 295* 124* 229*       Signed:  Irine Seal MD.  [facility] 01/01/2021, 3:50 PM

## 2021-01-02 ENCOUNTER — Encounter: Payer: Self-pay | Admitting: *Deleted

## 2021-01-02 ENCOUNTER — Other Ambulatory Visit: Payer: Self-pay | Admitting: *Deleted

## 2021-01-02 ENCOUNTER — Telehealth: Payer: Self-pay | Admitting: Family Medicine

## 2021-01-02 ENCOUNTER — Telehealth (INDEPENDENT_AMBULATORY_CARE_PROVIDER_SITE_OTHER): Payer: Self-pay

## 2021-01-02 DIAGNOSIS — E785 Hyperlipidemia, unspecified: Secondary | ICD-10-CM | POA: Diagnosis not present

## 2021-01-02 DIAGNOSIS — Z87442 Personal history of urinary calculi: Secondary | ICD-10-CM | POA: Diagnosis not present

## 2021-01-02 DIAGNOSIS — B965 Pseudomonas (aeruginosa) (mallei) (pseudomallei) as the cause of diseases classified elsewhere: Secondary | ICD-10-CM | POA: Diagnosis not present

## 2021-01-02 DIAGNOSIS — Z7984 Long term (current) use of oral hypoglycemic drugs: Secondary | ICD-10-CM | POA: Diagnosis not present

## 2021-01-02 DIAGNOSIS — I10 Essential (primary) hypertension: Secondary | ICD-10-CM | POA: Diagnosis not present

## 2021-01-02 DIAGNOSIS — Z7901 Long term (current) use of anticoagulants: Secondary | ICD-10-CM | POA: Diagnosis not present

## 2021-01-02 DIAGNOSIS — C3412 Malignant neoplasm of upper lobe, left bronchus or lung: Secondary | ICD-10-CM | POA: Diagnosis not present

## 2021-01-02 DIAGNOSIS — Z466 Encounter for fitting and adjustment of urinary device: Secondary | ICD-10-CM | POA: Diagnosis not present

## 2021-01-02 DIAGNOSIS — K5730 Diverticulosis of large intestine without perforation or abscess without bleeding: Secondary | ICD-10-CM | POA: Diagnosis not present

## 2021-01-02 DIAGNOSIS — N401 Enlarged prostate with lower urinary tract symptoms: Secondary | ICD-10-CM | POA: Diagnosis not present

## 2021-01-02 DIAGNOSIS — Z95828 Presence of other vascular implants and grafts: Secondary | ICD-10-CM | POA: Diagnosis not present

## 2021-01-02 DIAGNOSIS — C7931 Secondary malignant neoplasm of brain: Secondary | ICD-10-CM | POA: Diagnosis not present

## 2021-01-02 DIAGNOSIS — N390 Urinary tract infection, site not specified: Secondary | ICD-10-CM | POA: Diagnosis not present

## 2021-01-02 DIAGNOSIS — Z8673 Personal history of transient ischemic attack (TIA), and cerebral infarction without residual deficits: Secondary | ICD-10-CM | POA: Diagnosis not present

## 2021-01-02 DIAGNOSIS — E1165 Type 2 diabetes mellitus with hyperglycemia: Secondary | ICD-10-CM | POA: Diagnosis not present

## 2021-01-02 DIAGNOSIS — I82403 Acute embolism and thrombosis of unspecified deep veins of lower extremity, bilateral: Secondary | ICD-10-CM

## 2021-01-02 DIAGNOSIS — G9341 Metabolic encephalopathy: Secondary | ICD-10-CM | POA: Diagnosis not present

## 2021-01-02 DIAGNOSIS — I82443 Acute embolism and thrombosis of tibial vein, bilateral: Secondary | ICD-10-CM | POA: Diagnosis not present

## 2021-01-02 DIAGNOSIS — Z79891 Long term (current) use of opiate analgesic: Secondary | ICD-10-CM | POA: Diagnosis not present

## 2021-01-02 DIAGNOSIS — E1122 Type 2 diabetes mellitus with diabetic chronic kidney disease: Secondary | ICD-10-CM | POA: Diagnosis not present

## 2021-01-02 DIAGNOSIS — E1142 Type 2 diabetes mellitus with diabetic polyneuropathy: Secondary | ICD-10-CM | POA: Diagnosis not present

## 2021-01-02 DIAGNOSIS — Z1621 Resistance to vancomycin: Secondary | ICD-10-CM | POA: Diagnosis not present

## 2021-01-02 DIAGNOSIS — I2699 Other pulmonary embolism without acute cor pulmonale: Secondary | ICD-10-CM | POA: Diagnosis not present

## 2021-01-02 DIAGNOSIS — E7800 Pure hypercholesterolemia, unspecified: Secondary | ICD-10-CM | POA: Diagnosis not present

## 2021-01-02 DIAGNOSIS — R338 Other retention of urine: Secondary | ICD-10-CM | POA: Diagnosis not present

## 2021-01-02 DIAGNOSIS — N1831 Chronic kidney disease, stage 3a: Secondary | ICD-10-CM | POA: Diagnosis not present

## 2021-01-02 MED ORDER — APIXABAN 2.5 MG PO TABS: 2.5000 mg | ORAL_TABLET | Freq: Two times a day (BID) | ORAL | 1 refills | Status: DC

## 2021-01-02 NOTE — Telephone Encounter (Signed)
Covermymeds - [name] (Key: [id number]) Eliquis 2.5MG  tablets  Patient's pcn # is [id number] Rxbin # [id number] Rx group: [id number]

## 2021-01-02 NOTE — Telephone Encounter (Signed)
Msg family to get copy of insurance card for pharmacy meds.

## 2021-01-02 NOTE — Telephone Encounter (Signed)
Notified. 

## 2021-01-02 NOTE — Telephone Encounter (Signed)
Home Health Verbal Orders -[place]/Agency:[facility] Number:[phone] Requesting Theresia B[facility] Nursing/ 1 wk 5 for chronic cathter use UTIs Frequency:1 wk 5

## 2021-01-02 NOTE — Telephone Encounter (Signed)
Copied from [place] [id number]. Topic: General - Other >> Jan 02, 2021  9:46 AM Leward Quan A wrote: Reason for CRM: Tiffany with [facility] called in to inquire of Dr Ancil Boozer if it would be ok for them to go out and see patient on Saturday 01/04/21. They received a referral to care for patient Please call Tiffany at  Ph# [phone] opt# 2

## 2021-01-02 NOTE — Telephone Encounter (Signed)
Patient needs PA for Eliquis 2.5 mg. He is taking 1 tablet twice daily. Dr. Jacinto Reap I need to send a new script to patient's pharmacy as the original script is under the hospitalitist name in order for our dept to submit a PA.

## 2021-01-02 NOTE — Telephone Encounter (Signed)
Msg from cover mymeds  Your information has been submitted to [facility]. [employer] will review the request and will issue a decision, typically within 3-7 days from your submission. You can check the updated outcome later by reopening this request.  If [employer] has not responded in 3-7 days or if you have any questions about your ePA request, please contact [employer] at [phone]. If you think there may be a problem with your PA request, use our live chat feature at the bottom right.

## 2021-01-02 NOTE — Telephone Encounter (Signed)
This request has been approved by pt's insurance.

## 2021-01-03 ENCOUNTER — Telehealth: Payer: Self-pay

## 2021-01-03 NOTE — Telephone Encounter (Signed)
Transition Care Management Follow-up Telephone Call  Date of discharge and from where: 01/01/21 [facility]  How have you been since you were released from the hospital? Pt states he is doing okay  Any questions or concerns? No  Items Reviewed:  Did the pt receive and understand the discharge instructions provided? Yes   Medications obtained and verified? Yes   Other? No   Any new allergies since your discharge? No   Dietary orders reviewed? Yes  Do you have support at home? Yes   Home Care and Equipment/Supplies: Were home health services ordered? yes If so, what is the name of the agency? [facility]  Has the agency set up a time to come to the patient's home? yes Were any new equipment or medical supplies ordered?  No  Functional Questionnaire: (I = Independent and D = Dependent) ADLs: I with assistance  Bathing/Dressing- D  Meal Prep- D  Eating- I  Maintaining continence- I  Transferring/Ambulation- I  Managing Meds- D  Follow up appointments reviewed:   PCP Hospital f/u appt confirmed? Yes  Scheduled to see Dr. Ancil Boozer on 01/09/21 @ 11:40.  [facility] f/u appt confirmed? Yes  Scheduled to see [facility] on 01/06/21.  Are transportation arrangements needed? No   If their condition worsens, is the pt aware to call PCP or go to the Emergency Dept.? Yes  Was the patient provided with contact information for the PCP's office or ED? Yes  Was to pt encouraged to call back with questions or concerns? Yes

## 2021-01-06 ENCOUNTER — Encounter: Payer: Self-pay | Admitting: Radiation Oncology

## 2021-01-06 ENCOUNTER — Ambulatory Visit (INDEPENDENT_AMBULATORY_CARE_PROVIDER_SITE_OTHER): Payer: Medicare Other | Admitting: Physician Assistant

## 2021-01-06 ENCOUNTER — Other Ambulatory Visit: Payer: Self-pay

## 2021-01-06 ENCOUNTER — Ambulatory Visit
Admission: RE | Admit: 2021-01-06 | Discharge: 2021-01-06 | Disposition: A | Discharge status: Home / Self Care | Payer: Medicare Other | Source: Ambulatory Visit | Attending: Radiation Oncology | Admitting: Radiation Oncology

## 2021-01-06 ENCOUNTER — Ambulatory Visit: Payer: Medicare Other | Admitting: Physician Assistant

## 2021-01-06 VITALS — BP 119/83 | HR 103 | Temp 96.0°F | Wt 157.0 lb

## 2021-01-06 DIAGNOSIS — N138 Other obstructive and reflux uropathy: Secondary | ICD-10-CM | POA: Diagnosis not present

## 2021-01-06 DIAGNOSIS — N401 Enlarged prostate with lower urinary tract symptoms: Secondary | ICD-10-CM | POA: Diagnosis not present

## 2021-01-06 DIAGNOSIS — C3490 Malignant neoplasm of unspecified part of unspecified bronchus or lung: Secondary | ICD-10-CM

## 2021-01-06 DIAGNOSIS — C7931 Secondary malignant neoplasm of brain: Secondary | ICD-10-CM

## 2021-01-06 NOTE — Progress Notes (Signed)
Patient was scheduled for postop voiding trial today, however HOLEP was rescheduled to this coming Friday due to recent hospitalization.  I plugged his catheter in clinic and obtained a urine sample for repeat preop culture.  Rescheduled postop voiding trial to early next week.  Patient expressed understanding

## 2021-01-06 NOTE — Progress Notes (Signed)
Radiation Oncology Follow up Note  Name: Melvin Willis   Date:   01/06/2021 MRN:  [id number] DOB: April 26, 1951    This 70 y.o. 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  He specifically [place] headaches any change in visual fields or any focal neurologic deficits.  COMPLICATIONS OF TREATMENT: none  FOLLOW UP COMPLIANCE: keeps appointments   PHYSICAL EXAM:  BP 119/83   Pulse (!) 103   Temp (!) 96 F (35.6 C) (Tympanic)   Wt 157 lb (71.2 kg)   BMI 26.95 kg/m  Well-developed well-nourished patient in NAD. HEENT reveals PERLA, EOMI, discs not visualized.  Oral cavity is clear. No oral mucosal lesions are identified. Neck is clear without evidence of cervical or supraclavicular adenopathy. Lungs are clear to A&P. Cardiac examination is essentially unremarkable with regular rate and rhythm without murmur rub or thrill. Abdomen is benign with no organomegaly or masses noted. Motor sensory and DTR levels are equal and symmetric in the upper and lower extremities. Cranial nerves II through XII are grossly intact. Proprioception is intact. No peripheral adenopathy or edema is identified. No motor or sensory levels are noted. Crude visual fields are within normal range.  RADIOLOGY RESULTS: MRI scan reviewed  compatible with above-stated findings  PLAN: Present time patient is stable from a neurologic standpoint.  He seems much improved over his recent hospitalization.  I am pleased with his overall progress.  I am turning follow-up care over to medical oncology will be happy to reevaluate the patient anytime should further palliative treatment be indicated.  I would like to take this opportunity to thank you for allowing me to participate in the care of your patient.Noreene Filbert, MD

## 2021-01-07 ENCOUNTER — Other Ambulatory Visit: Payer: Self-pay | Admitting: Urology

## 2021-01-07 ENCOUNTER — Telehealth: Payer: Self-pay | Admitting: Family Medicine

## 2021-01-07 ENCOUNTER — Other Ambulatory Visit: Payer: Self-pay | Admitting: Family Medicine

## 2021-01-07 DIAGNOSIS — B965 Pseudomonas (aeruginosa) (mallei) (pseudomallei) as the cause of diseases classified elsewhere: Secondary | ICD-10-CM | POA: Diagnosis not present

## 2021-01-07 DIAGNOSIS — C3412 Malignant neoplasm of upper lobe, left bronchus or lung: Secondary | ICD-10-CM | POA: Diagnosis not present

## 2021-01-07 DIAGNOSIS — Z1621 Resistance to vancomycin: Secondary | ICD-10-CM | POA: Diagnosis not present

## 2021-01-07 DIAGNOSIS — N390 Urinary tract infection, site not specified: Secondary | ICD-10-CM | POA: Diagnosis not present

## 2021-01-07 DIAGNOSIS — R338 Other retention of urine: Secondary | ICD-10-CM | POA: Diagnosis not present

## 2021-01-07 DIAGNOSIS — N401 Enlarged prostate with lower urinary tract symptoms: Secondary | ICD-10-CM | POA: Diagnosis not present

## 2021-01-07 NOTE — Progress Notes (Signed)
Name: Melvin Willis   MRN: [id number]    DOB: [birth year]   Date:01/09/2021       Progress Note  [facility] Follow Up  HPI  Admitted: 12/28/20 Discharged: 01/01/21  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 Went to [facility] on 12/28/2020 due to urinary retention, weakness and lower extremity edema .   He was diagnosed with urinary retention and UTI - vancomycin resistant Enterococcus faecalis , he was found to have bilateral DVT and had to get an IVC filter placed, his glucose was elevated , A1C was high at 9.5 % but that is down from 10.5 % ( Feb 2022 ) Since he has been discharged home he is feeling better, lower extremity edema is almost gone, appetite is okay, still taking antibiotics and has foley catheter in place. Glucose has been under good control from 113-120's Denies polyphagia, polydipsia. His confusion is back to baseline   Discussed with patient and his wife risk of hypoglycemia with glipizide and soliqua and to consider going down to 2.5 mg glipizide bid and titrate dose of Soliqua up - currently on 15 units daily, but they are afraid of changing anything at this time  He is still taking antibiotics as prescribed and is going to Urologist for procedure and hopefully have foley removed afterwards   Patient Active Problem List   Diagnosis Date Noted  . Deep vein thrombosis (DVT) of proximal lower extremity ([place])   . Acute deep vein thrombosis (DVT) of tibial vein of both lower extremities (HCC)   . Urinary retention 12/29/2020  . Leg DVT (deep venous thromboembolism), acute, bilateral ([place]) 12/29/2020  . UTI (urinary tract infection) 12/28/2020  . Delirium   . Palliative care encounter   . Pneumonia due to infectious organism   . Sepsis ([place]) 11/14/2020  . Septic shock ([place]) 11/14/2020  . Acute metabolic encephalopathy 19/37/9024  . Acute  respiratory failure ([place]) 11/14/2020  . Transaminitis 11/14/2020  . Supratherapeutic INR 11/14/2020  . Acute-on-chronic kidney injury ([place]) 11/14/2020  . Brain metastasis ([place]) 09/25/2019  . Atherosclerosis of aorta ([place]) 09/04/2019  . Goals of care, counseling/discussion 08/02/2019  . Primary malignant neoplasm of lung metastatic to other site ([place]) 04/07/2019  . Chronic deep vein thrombosis (DVT) of left lower extremity ([place]) 01/05/2018  . Chronic kidney disease (CKD), stage III (moderate) ([place]) 11/27/2016  . Old cerebrovascular accident (CVA) without late effect   . Blurred vision, bilateral 06/23/2016  . Primary cancer of left upper lobe of lung ([place]) 06/12/2016  . Mediastinal mass   . History of nephrolithiasis 03/27/2016  . Pharyngeal dysphagia 01/01/2016  . Benign neoplasm of ascending colon   . Benign neoplasm of sigmoid colon   . Benign neoplasm of transverse colon   . Diverticulosis of large intestine without diverticulitis   . Overweight (BMI 25.0-29.9) 04/16/2015  . Type 2 diabetes mellitus with stage 3 chronic kidney disease ([place]) 03/18/2015  . Dyslipidemia 03/18/2015  . Disorder of male genital organ 08/08/2012  . 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 Elevated PSA 08/08/2012    Past Surgical History:  Procedure Laterality Date  . COLONOSCOPY    . COLONOSCOPY WITH PROPOFOL N/A 05/06/2015   Procedure: COLONOSCOPY WITH PROPOFOL;  Surgeon: Lucilla Lame, MD;  Location: [facility];  Service: Endoscopy;  Laterality: N/A;  ASCENDING COLON POLYPS X 2 TERMINAL ILEUM BIOPSY  RANDOM COLON BX. TRANSVERSE COLON POLYP SIGMOID COLON POLYP  . ESOPHAGOGASTRODUODENOSCOPY (EGD) WITH PROPOFOL N/A 05/06/2015   Procedure: ESOPHAGOGASTRODUODENOSCOPY (EGD) WITH PROPOFOL;  Surgeon: Lucilla Lame, MD;  Location: [facility];  Service: Endoscopy;  Laterality: N/A;  GASTRIC BIOPSY X1  . IVC FILTER INSERTION N/A 12/29/2020   Procedure: IVC FILTER INSERTION;   Surgeon: Dwana Curd, MD;  Location: [facility];  Service: Cardiovascular;  Laterality: N/A;  . KIDNEY STONE SURGERY  2017    Family History  Problem Relation Age of Onset  . Diabetes Mother   . Diabetes Father   . CAD Father   . Dementia Father   . Diabetes Sister   . Cancer Maternal Uncle        Prostate  . Cancer Cousin        prostate    Social History   Tobacco Use  . Smoking status: Never Smoker  . Smokeless tobacco: Never Used  Substance Use Topics  . Alcohol use: No    Alcohol/week: 0.0 standard drinks     Current Outpatient Medications:  .  amoxicillin (AMOXIL) 500 MG capsule, Take 1 capsule (500 mg total) by mouth every 8 (eight) hours for 10 days., Disp: 30 capsule, Rfl: 0 .  apixaban (ELIQUIS) 2.5 MG TABS tablet, Take 1 tablet (2.5 mg total) by mouth 2 (two) times daily., Disp: 60 tablet, Rfl: 1 .  atorvastatin (LIPITOR) 40 MG tablet, Take 1 tablet (40 mg total) by mouth daily., Disp: 90 tablet, Rfl: 0 .  blood glucose meter kit and supplies, Dispense based on patient and insurance preference (Accuchek Aviva Plus, Accuchek Nano). Use up to four times daily as directed. (FOR ICD-10 E10.9, E11.9)., Disp: 1 each, Rfl: 0 .  ciprofloxacin (CIPRO) 500 MG tablet, Take 1 tablet (500 mg total) by mouth 2 (two) times daily for 10 days., Disp: 20 tablet, Rfl: 0 .  finasteride (PROSCAR) 5 MG tablet, Take 1 tablet (5 mg total) by mouth daily., Disp: 90 tablet, Rfl: 3 .  gabapentin (NEURONTIN) 300 MG capsule, Take 1 capsule by mouth at bedtime (Patient taking differently: Take 300 mg by mouth at bedtime.), Disp: 90 capsule, Rfl: 1 .  glipiZIDE (GLUCOTROL) 5 MG tablet, Take 1 tablet (5 mg total) by mouth 2 (two) times daily before a meal., Disp: 60 tablet, Rfl: 3 .  Insulin Glargine-Lixisenatide 100-33 UNT-MCG/ML SOPN, Inject 15 Units into the skin daily with lunch., Disp: , Rfl:  .  Insulin Pen Needle (NOVOFINE PEN NEEDLE) 32G X 6 MM MISC, 1 each by Does not apply  route daily., Disp: 100 each, Rfl: 2 .  levETIRAcetam (KEPPRA) 500 MG tablet, Take 1 tablet (500 mg total) by mouth 2 (two) times daily., Disp: 60 tablet, Rfl: 0 .  [START ON 01/15/2021] lorlatinib (LORBRENA) 100 MG tablet, Take 1 tablet (100 mg total) by mouth daily. Swallow tablets whole. Do not chew, crush or split tablets., Disp: 30 tablet, Rfl: 6 .  tamsulosin (FLOMAX) 0.4 MG CAPS capsule, Take 1 capsule by mouth once daily in the morning (Patient taking differently: Take 0.4 mg by mouth in the morning.), Disp: 90 capsule, Rfl: 3  No Known Allergies  I personally reviewed active problem list, medication list, allergies, family history, social history, health maintenance with the patient/caregiver today.   ROS  Ten systems reviewed and is negative except as mentioned in HPI   Objective  Vitals:   01/09/21 1146  BP: 122/84  Pulse: 84  Resp: 16  Temp: 98.6 F (37 C)  TempSrc: Oral  SpO2: 96%  Weight: 157 lb (71.2 kg)  Height: _0  (1.626 m)    Body mass index is 26.95 kg/m.  Physical Exam  Constitutional: Patient appears well-developed  No distress.  HEENT: head atraumatic, normocephalic, pupils equal and reactive to ligh, neck supple Cardiovascular: Normal rate, regular rhythm and normal heart sounds.  No murmur heard. Trace BLE edema. Pulmonary/Chest: Effort normal and breath sounds normal. No respiratory distress. Abdominal: Soft.  There is no tenderness. Psychiatric: Patient has a normal mood and affect. behavior is normal. Judgment and thought content normal.   Recent Results (from the past 2160 hour(s))  POCT HgB A1C     Status: Abnormal   Collection Time: 10/31/20 10:06 AM  Result Value Ref Range   Hemoglobin A1C 10.5 (A) 4.0 - 5.6 %   HbA1c POC (<> result, manual entry)     HbA1c, POC (prediabetic range)     HbA1c, POC (controlled diabetic range)    Lactic acid, plasma     Status: Abnormal   Collection Time: 11/14/20  2:53 PM  Result Value Ref Range    Lactic Acid, Venous 5.3 (HH) 0.5 - 1.9 mmol/L    Comment: CRITICAL RESULT CALLED TO, READ BACK BY AND VERIFIED WITH ROBIN REGISTER AT 1555 11/14/20 DAS Performed at [facility], [address] [place], [place] [address]   Comprehensive metabolic panel     Status: Abnormal   Collection Time: 11/14/20  2:53 PM  Result Value Ref Range   Sodium 135 135 - 145 mmol/L   Potassium 5.0 3.5 - 5.1 mmol/L   Chloride 101 98 - 111 mmol/L   CO2 21 (L) 22 - 32 mmol/L   Glucose, Bld 252 (H) 70 - 99 mg/dL    Comment: Glucose reference range applies only to samples taken after fasting for at least 8 hours.   BUN 31 (H) 8 - 23 mg/dL   Creatinine, Ser 2.18 (H) 0.61 - 1.24 mg/dL   Calcium 9.5 8.9 - 10.3 mg/dL   Total Protein 7.8 6.5 - 8.1 g/dL   Albumin 4.1 3.5 - 5.0 g/dL   AST 1,838 (H) 15 - 41 U/L   ALT 1,358 (H) 0 - 44 U/L   Alkaline Phosphatase 211 (H) 38 - 126 U/L   Total Bilirubin 4.5 (H) 0.3 - 1.2 mg/dL   GFR, Estimated 32 (L) >60 mL/min    Comment: (NOTE) Calculated using the CKD-EPI Creatinine Equation (2021)    Anion gap 13 5 - 15    Comment: Performed at [facility], [address] [place],  [address]  CBC WITH DIFFERENTIAL     Status: Abnormal   Collection Time: 11/14/20  2:53 PM  Result Value Ref Range   WBC 8.6 4.0 - 10.5 K/uL   RBC 5.03 4.22 - 5.81 MIL/uL   Hemoglobin 14.9 13.0 - 17.0 g/dL   HCT 44.0 39.0 - 52.0 %   MCV 87.5 80.0 - 100.0 fL   MCH 29.6 26.0 - 34.0 pg   MCHC 33.9 30.0 - 36.0 g/dL   RDW 14.7 11.5 - 15.5 %   Platelets 205 150 - 400 K/uL   nRBC 0.0 0.0 - 0.2 %   Neutrophils Relative % 89 %   Neutro Abs 7.6 1.7 - 7.7 K/uL   Lymphocytes Relative 4 %   Lymphs Abs 0.4 (L) 0.7 - 4.0 K/uL   Monocytes Relative 7 %   Monocytes Absolute 0.6 0.1 -  1.0 K/uL   Eosinophils Relative 0 %   Eosinophils Absolute 0.0 0.0 - 0.5 K/uL   Basophils Relative 0 %   Basophils Absolute 0.0 0.0 - 0.1 K/uL   WBC Morphology TOXIC GRANULATION    RBC Morphology  MORPHOLOGY UNREMARKABLE    Smear Review Normal platelet morphology    Immature Granulocytes 0 %   Abs Immature Granulocytes 0.03 0.00 - 0.07 K/uL    Comment: Performed at [facility], [address] [place], [place] [address]  Protime-INR     Status: Abnormal   Collection Time: 11/14/20  2:53 PM  Result Value Ref Range   Prothrombin Time 30.0 (H) 11.4 - 15.2 seconds   INR 3.0 (H) 0.8 - 1.2    Comment: Performed at [facility], [place], [place] [address]  APTT     Status: Abnormal   Collection Time: 11/14/20  2:53 PM  Result Value Ref Range   aPTT 37 (H) 24 - 36 seconds    Comment:        IF BASELINE aPTT IS ELEVATED, SUGGEST PATIENT RISK ASSESSMENT BE USED TO DETERMINE APPROPRIATE ANTICOAGULANT THERAPY. Performed at [facility], [address] [place], [place] [address]   Blood Culture (routine x 2)     Status: Abnormal   Collection Time: 11/14/20  2:53 PM   Specimen: BLOOD  Result Value Ref Range   Specimen Description      BLOOD RIGHT ANTECUBITAL Performed at [facility], [address] [place], [place] [address]    Special Requests      BOTTLES DRAWN AEROBIC AND ANAEROBIC Blood Culture adequate volume Performed at [facility], [address] [place], [place] [address]    Culture  Setup Time      GRAM NEGATIVE RODS IN BOTH AEROBIC AND ANAEROBIC BOTTLES CRITICAL VALUE NOTED.  VALUE IS CONSISTENT WITH PREVIOUSLY REPORTED AND CALLED VALUE. Performed at [facility], [place], [place] [address]    Culture (A)     KLEBSIELLA PNEUMONIAE SUSCEPTIBILITIES PERFORMED ON PREVIOUS CULTURE WITHIN THE LAST 5 DAYS. Performed at [facility], [place] [address], [place], [place] [address]    Report Status 11/17/2020 FINAL   Brain natriuretic peptide     Status: Abnormal   Collection Time: 11/14/20  2:53 PM  Result Value Ref Range   B Natriuretic Peptide 151.4 (H) 0.0 - 100.0 pg/mL     Comment: Performed at [facility], [place], [place] [address]  Troponin I (High Sensitivity)     Status: Abnormal   Collection Time: 11/14/20  2:53 PM  Result Value Ref Range   Troponin I (High Sensitivity) 130 (HH) <18 ng/L    Comment: CRITICAL RESULT CALLED TO, READ BACK BY AND VERIFIED WITH ROBIN REGISTER AT 1608 11/14/20 DAS (NOTE) Elevated high sensitivity troponin I (hsTnI) values and significant  changes across serial measurements may suggest ACS but many other  chronic and acute conditions are known to elevate hsTnI results.  Refer to the Links section for chest pain algorithms and additional  guidance. Performed at [facility], [address] [place], [place] [address]   Resp Panel by RT-PCR (Flu A&B, Covid) Nasopharyngeal Swab     Status: None   Collection Time: 11/14/20  3:49 PM   Specimen: Nasopharyngeal Swab; Nasopharyngeal(NP) swabs in vial transport medium  Result Value Ref Range   SARS Coronavirus 2 by RT PCR NEGATIVE NEGATIVE  Comment: (NOTE) SARS-CoV-2 target nucleic acids are NOT DETECTED.  The SARS-CoV-2 RNA is generally detectable in upper respiratory specimens during the acute phase of infection. The lowest concentration of SARS-CoV-2 viral copies this assay can detect is 138 copies/mL. A negative result does not preclude SARS-Cov-2 infection and should not be used as the sole basis for treatment or other patient management decisions. A negative result may occur with  improper specimen collection/handling, submission of specimen other than nasopharyngeal swab, presence of viral mutation(s) within the areas targeted by this assay, and inadequate number of viral copies(<138 copies/mL). A negative result must be combined with clinical observations, patient history, and epidemiological information. The expected result is Negative.  Fact Sheet for Patients:  EntrepreneurPulse.com.au  Fact Sheet for  Healthcare Providers:  IncredibleEmployment.be  This test is no t yet approved or cleared by the [place] FDA and  has been authorized for detection and/or diagnosis of SARS-CoV-2 by FDA under an Emergency Use Authorization (EUA). This EUA will remain  in effect (meaning this test can be used) for the duration of the COVID-19 declaration under Section 564(b)(1) of the Act, 21 U.S.C.section 360bbb-3(b)(1), unless the authorization is terminated  or revoked sooner.       Influenza A by PCR NEGATIVE NEGATIVE   Influenza B by PCR NEGATIVE NEGATIVE    Comment: (NOTE) The Xpert Xpress SARS-CoV-2/FLU/RSV plus assay is intended as an aid in the diagnosis of influenza from Nasopharyngeal swab specimens and should not be used as a sole basis for treatment. Nasal washings and aspirates are unacceptable for Xpert Xpress SARS-CoV-2/FLU/RSV testing.  Fact Sheet for Patients: EntrepreneurPulse.com.au  Fact Sheet for Healthcare Providers: IncredibleEmployment.be  This test is not yet approved or cleared by the [place] FDA and has been authorized for detection and/or diagnosis of SARS-CoV-2 by FDA under an Emergency Use Authorization (EUA). This EUA will remain in effect (meaning this test can be used) for the duration of the COVID-19 declaration under Section 564(b)(1) of the Act, 21 U.S.C. section 360bbb-3(b)(1), unless the authorization is terminated or revoked.  Performed at [facility], [address] [place], [place] [address]   Blood Culture (routine x 2)     Status: Abnormal   Collection Time: 11/14/20  3:49 PM   Specimen: BLOOD  Result Value Ref Range   Specimen Description      BLOOD LEFT ANTECUBITAL Performed at [facility], [address], [place], [place] [address]    Special Requests      BOTTLES DRAWN AEROBIC AND ANAEROBIC Blood Culture adequate volume Performed at [facility], [address] [place], [place] [address]    Culture  Setup Time      GRAM NEGATIVE RODS IN BOTH AEROBIC AND ANAEROBIC BOTTLES CRITICAL RESULT CALLED TO, READ BACK BY AND VERIFIED WITH: Tana Felts RN [id number] 11/15/20 HNM Performed at [facility], [address], [place], [place] [address]    Culture KLEBSIELLA PNEUMONIAE (A)    Report Status 11/17/2020 FINAL    Organism ID, Bacteria KLEBSIELLA PNEUMONIAE       Susceptibility   Klebsiella pneumoniae - MIC*    AMPICILLIN RESISTANT Resistant     CEFAZOLIN <=4 SENSITIVE Sensitive     CEFEPIME <=0.12 SENSITIVE Sensitive     CEFTAZIDIME <=1 SENSITIVE Sensitive     CEFTRIAXONE <=0.25 SENSITIVE Sensitive     CIPROFLOXACIN <=0.25 SENSITIVE Sensitive     GENTAMICIN <=1 SENSITIVE Sensitive     IMIPENEM <=0.25 SENSITIVE Sensitive  TRIMETH/SULFA <=20 SENSITIVE Sensitive     AMPICILLIN/SULBACTAM <=2 SENSITIVE Sensitive     PIP/TAZO <=4 SENSITIVE Sensitive     * KLEBSIELLA PNEUMONIAE  Blood Culture ID Panel (Reflexed)     Status: Abnormal   Collection Time: 11/14/20  3:49 PM  Result Value Ref Range   Enterococcus faecalis NOT DETECTED NOT DETECTED   Enterococcus Faecium NOT DETECTED NOT DETECTED   Listeria monocytogenes NOT DETECTED NOT DETECTED   Staphylococcus species NOT DETECTED NOT DETECTED   Staphylococcus aureus (BCID) NOT DETECTED NOT DETECTED   Staphylococcus epidermidis NOT DETECTED NOT DETECTED   Staphylococcus lugdunensis NOT DETECTED NOT DETECTED   Streptococcus species NOT DETECTED NOT DETECTED   Streptococcus agalactiae NOT DETECTED NOT DETECTED   Streptococcus pneumoniae NOT DETECTED NOT DETECTED   Streptococcus pyogenes NOT DETECTED NOT DETECTED   A.calcoaceticus-baumannii NOT DETECTED NOT DETECTED   Bacteroides fragilis NOT DETECTED NOT DETECTED   Enterobacterales DETECTED (A) NOT DETECTED    Comment: Enterobacterales represent a large order of gram negative bacteria, not a single organism. CRITICAL  RESULT CALLED TO, READ BACK BY AND VERIFIED WITH: Tana Felts RN [id number] 11/15/20 HNM    Enterobacter cloacae complex NOT DETECTED NOT DETECTED   Escherichia coli NOT DETECTED NOT DETECTED   Klebsiella aerogenes NOT DETECTED NOT DETECTED   Klebsiella oxytoca NOT DETECTED NOT DETECTED   Klebsiella pneumoniae DETECTED (A) NOT DETECTED    Comment: CRITICAL RESULT CALLED TO, READ BACK BY AND VERIFIED WITH: Tana Felts RN [id number] 11/15/20 HNM    Proteus species NOT DETECTED NOT DETECTED   Salmonella species NOT DETECTED NOT DETECTED   Serratia marcescens NOT DETECTED NOT DETECTED   Haemophilus influenzae NOT DETECTED NOT DETECTED   Neisseria meningitidis NOT DETECTED NOT DETECTED   Pseudomonas aeruginosa NOT DETECTED NOT DETECTED   Stenotrophomonas maltophilia NOT DETECTED NOT DETECTED   Candida albicans NOT DETECTED NOT DETECTED   Candida auris NOT DETECTED NOT DETECTED   Candida glabrata NOT DETECTED NOT DETECTED   Candida krusei NOT DETECTED NOT DETECTED   Candida parapsilosis NOT DETECTED NOT DETECTED   Candida tropicalis NOT DETECTED NOT DETECTED   Cryptococcus neoformans/gattii NOT DETECTED NOT DETECTED   CTX-M ESBL NOT DETECTED NOT DETECTED   Carbapenem resistance IMP NOT DETECTED NOT DETECTED   Carbapenem resistance KPC NOT DETECTED NOT DETECTED   Carbapenem resistance NDM NOT DETECTED NOT DETECTED   Carbapenem resist OXA 48 LIKE NOT DETECTED NOT DETECTED   Carbapenem resistance VIM NOT DETECTED NOT DETECTED    Comment: Performed at [facility], [address] [place], [place] [address]  Lactic acid, plasma     Status: Abnormal   Collection Time: 11/14/20  5:25 PM  Result Value Ref Range   Lactic Acid, Venous 4.3 (HH) 0.5 - 1.9 mmol/L    Comment: CRITICAL VALUE NOTED. VALUE IS CONSISTENT WITH PREVIOUSLY REPORTED/CALLED VALUE SKL Performed at [facility], [place], [place] [address]   Troponin I (High Sensitivity)     Status: Abnormal    Collection Time: 11/14/20  7:40 PM  Result Value Ref Range   Troponin I (High Sensitivity) 175 (HH) <18 ng/L    Comment: CRITICAL VALUE NOTED. VALUE IS CONSISTENT WITH PREVIOUSLY REPORTED/CALLED VALUE SKL (NOTE) Elevated high sensitivity troponin I (hsTnI) values and significant  changes across serial measurements may suggest ACS but many other  chronic and acute conditions are known to elevate hsTnI results.  Refer to the "Links" section for chest pain algorithms and additional  guidance. Performed at [facility], [address] [place], [place] [address]   Hepatitis panel, acute     Status: None   Collection Time: 11/14/20  7:40 PM  Result Value Ref Range   Hepatitis B Surface Ag NON REACTIVE NON REACTIVE   HCV Ab NON REACTIVE NON REACTIVE    Comment: (NOTE) Nonreactive HCV antibody screen is consistent with no HCV infections,  unless recent infection is suspected or other evidence exists to indicate HCV infection.     Hep A IgM NON REACTIVE NON REACTIVE   Hep B C IgM NON REACTIVE NON REACTIVE    Comment: Performed at [facility], [place] [address], [place], [place] [address]  Lactic acid, plasma     Status: Abnormal   Collection Time: 11/14/20  7:40 PM  Result Value Ref Range   Lactic Acid, Venous 3.6 (HH) 0.5 - 1.9 mmol/L    Comment: CRITICAL VALUE NOTED. VALUE IS CONSISTENT WITH PREVIOUSLY REPORTED/CALLED VALUE SKL Performed at [facility], [address] [place], [place] [address]   CBG monitoring, ED     Status: Abnormal   Collection Time: 11/14/20 10:34 PM  Result Value Ref Range   Glucose-Capillary 266 (H) 70 - 99 mg/dL    Comment: Glucose reference range applies only to samples taken after fasting for at least 8 hours.  Urinalysis, Complete w Microscopic     Status: Abnormal   Collection Time: 11/15/20  4:20 AM  Result Value Ref Range   Color, Urine YELLOW (A) YELLOW   APPearance HAZY (A) CLEAR   Specific Gravity, Urine 1.013 1.005 -  1.030   pH 5.0 5.0 - 8.0   Glucose, UA 150 (A) NEGATIVE mg/dL   Hgb urine dipstick LARGE (A) NEGATIVE   Bilirubin Urine NEGATIVE NEGATIVE   Ketones, ur NEGATIVE NEGATIVE mg/dL   Protein, ur NEGATIVE NEGATIVE mg/dL   Nitrite NEGATIVE NEGATIVE   Leukocytes,Ua NEGATIVE NEGATIVE   WBC, UA 0-5 0 - 5 WBC/hpf   Bacteria, UA RARE (A) NONE SEEN   Squamous Epithelial / LPF 0-5 0 - 5   Mucus PRESENT     Comment: Performed at [facility] [address] [place], [place] [address]  Urine culture     Status: None   Collection Time: 11/15/20  4:20 AM   Specimen: In/Out Cath Urine  Result Value Ref Range   Specimen Description      IN/OUT CATH URINE Performed at [facility], [address], [place], [place] [address]    Special Requests      NONE Performed at [facility], [address] [place], [place] [address]    Culture      NO GROWTH Performed at [facility], [place] [address], [place], [place] [address]    Report Status 11/16/2020 FINAL   CBC     Status: None   Collection Time: 11/15/20  4:20 AM  Result Value Ref Range   WBC 9.8 4.0 - 10.5 K/uL   RBC 4.86 4.22 - 5.81 MIL/uL   Hemoglobin 14.4 13.0 - 17.0 g/dL   HCT 42.9 39.0 - 52.0 %   MCV 88.3 80.0 - 100.0 fL   MCH 29.6 26.0 - 34.0 pg   MCHC 33.6 30.0 - 36.0 g/dL   RDW 14.6 11.5 - 15.5 %   Platelets 184 150 - 400 K/uL   nRBC 0.0 0.0 - 0.2 %    Comment: Performed at [facility], [address] [place], [place] [address]  Comprehensive metabolic panel     Status: Abnormal   Collection Time: 11/15/20  4:20 AM  Result Value Ref Range   Sodium 137 135 - 145 mmol/L   Potassium 4.3 3.5 - 5.1 mmol/L   Chloride 106 98 - 111 mmol/L   CO2 20 (L) 22 - 32 mmol/L   Glucose, Bld 285 (H) 70 - 99 mg/dL    Comment: Glucose reference range applies only to samples taken after fasting for at least 8 hours.   BUN 29 (H) 8 - 23 mg/dL   Creatinine, Ser 1.77 (H) 0.61 - 1.24 mg/dL   Calcium 9.1  8.9 - 10.3 mg/dL   Total Protein 7.4 6.5 - 8.1 g/dL   Albumin 3.7 3.5 - 5.0 g/dL   AST 629 (H) 15 - 41 U/L   ALT 899 (H) 0 - 44 U/L   Alkaline Phosphatase 175 (H) 38 - 126 U/L   Total Bilirubin 5.7 (H) 0.3 - 1.2 mg/dL   GFR, Estimated 42 (L) >60 mL/min    Comment: (NOTE) Calculated using the CKD-EPI Creatinine Equation (2021)    Anion gap 11 5 - 15    Comment: Performed at [facility], [address] [place], [place] [address]  Lactic acid, plasma     Status: Abnormal   Collection Time: 11/15/20  4:20 AM  Result Value Ref Range   Lactic Acid, Venous 3.3 (HH) 0.5 - 1.9 mmol/L    Comment: CRITICAL VALUE NOTED. VALUE IS CONSISTENT WITH PREVIOUSLY REPORTED/CALLED VALUE RH Performed at [facility], [address] [place], [place] [address]   Strep pneumoniae urinary antigen     Status: None   Collection Time: 11/15/20  4:20 AM  Result Value Ref Range   Strep Pneumo Urinary Antigen NEGATIVE NEGATIVE    Comment:        Infection due to S. pneumoniae cannot be absolutely ruled out since the antigen present may be below the detection limit of the test. Performed at [facility], [address] [place], [place] [address]   CBG monitoring, ED     Status: Abnormal   Collection Time: 11/15/20  8:04 AM  Result Value Ref Range   Glucose-Capillary 307 (H) 70 - 99 mg/dL    Comment: Glucose reference range applies only to samples taken after fasting for at least 8 hours.  CBG monitoring, ED     Status: Abnormal   Collection Time: 11/15/20 12:03 PM  Result Value Ref Range   Glucose-Capillary 232 (H) 70 - 99 mg/dL    Comment: Glucose reference range applies only to samples taken after fasting for at least 8 hours.  Glucose, capillary     Status: Abnormal   Collection Time: 11/15/20  3:48 PM  Result Value Ref Range   Glucose-Capillary 229 (H) 70 - 99 mg/dL    Comment: Glucose reference range applies only to samples taken after fasting for at least 8 hours.   Glucose, capillary     Status: Abnormal   Collection Time: 11/15/20  8:53 PM  Result Value Ref Range   Glucose-Capillary 277 (H) 70 - 99 mg/dL    Comment: Glucose reference range applies only to samples taken after fasting for at least 8 hours.  Lactic acid, plasma     Status: None   Collection Time: 11/16/20  5:31 AM  Result Value Ref Range   Lactic Acid, Venous 1.7 0.5 - 1.9 mmol/L    Comment:  Performed at [facility], [address] [place], [place] [address]  Glucose, capillary     Status: Abnormal   Collection Time: 11/16/20 10:10 AM  Result Value Ref Range   Glucose-Capillary 284 (H) 70 - 99 mg/dL    Comment: Glucose reference range applies only to samples taken after fasting for at least 8 hours.  Glucose, capillary     Status: Abnormal   Collection Time: 11/16/20  1:16 PM  Result Value Ref Range   Glucose-Capillary 294 (H) 70 - 99 mg/dL    Comment: Glucose reference range applies only to samples taken after fasting for at least 8 hours.  Glucose, capillary     Status: Abnormal   Collection Time: 11/16/20  3:40 PM  Result Value Ref Range   Glucose-Capillary 254 (H) 70 - 99 mg/dL    Comment: Glucose reference range applies only to samples taken after fasting for at least 8 hours.  Glucose, capillary     Status: Abnormal   Collection Time: 11/16/20  5:54 PM  Result Value Ref Range   Glucose-Capillary 214 (H) 70 - 99 mg/dL    Comment: Glucose reference range applies only to samples taken after fasting for at least 8 hours.  Glucose, capillary     Status: Abnormal   Collection Time: 11/16/20  7:59 PM  Result Value Ref Range   Glucose-Capillary 228 (H) 70 - 99 mg/dL    Comment: Glucose reference range applies only to samples taken after fasting for at least 8 hours.   Comment 1 Notify RN   Glucose, capillary     Status: Abnormal   Collection Time: 11/17/20  8:31 AM  Result Value Ref Range   Glucose-Capillary 255 (H) 70 - 99 mg/dL    Comment: Glucose reference  range applies only to samples taken after fasting for at least 8 hours.  Glucose, capillary     Status: Abnormal   Collection Time: 11/17/20 12:06 PM  Result Value Ref Range   Glucose-Capillary 281 (H) 70 - 99 mg/dL    Comment: Glucose reference range applies only to samples taken after fasting for at least 8 hours.  CULTURE, BLOOD (ROUTINE X 2) w Reflex to ID Panel     Status: None   Collection Time: 11/17/20  1:00 PM   Specimen: BLOOD  Result Value Ref Range   Specimen Description BLOOD RIGHT ANTECUBITAL    Special Requests      BOTTLES DRAWN AEROBIC AND ANAEROBIC Blood Culture adequate volume   Culture      NO GROWTH 5 DAYS Performed at [facility], [address] [place], [place] [address]    Report Status 11/22/2020 FINAL   CULTURE, BLOOD (ROUTINE X 2) w Reflex to ID Panel     Status: None   Collection Time: 11/17/20  1:00 PM   Specimen: BLOOD  Result Value Ref Range   Specimen Description BLOOD BLOOD LEFT HAND    Special Requests      BOTTLES DRAWN AEROBIC AND ANAEROBIC Blood Culture adequate volume   Culture      NO GROWTH 5 DAYS Performed at [facility], [address] [place], [place] [address]    Report Status 11/22/2020 FINAL   Hepatic function panel     Status: Abnormal   Collection Time: 11/17/20  2:06 PM  Result Value Ref Range   Total Protein 7.1 6.5 - 8.1 g/dL   Albumin 3.4 (L) 3.5 - 5.0 g/dL   AST 64 (H) 15 -  41 U/L   ALT 383 (H) 0 - 44 U/L   Alkaline Phosphatase 132 (H) 38 - 126 U/L   Total Bilirubin 1.2 0.3 - 1.2 mg/dL   Bilirubin, Direct 0.3 (H) 0.0 - 0.2 mg/dL   Indirect Bilirubin 0.9 0.3 - 0.9 mg/dL    Comment: Performed at [facility], [address] [place], [place] [address]  Glucose, capillary     Status: Abnormal   Collection Time: 11/17/20  4:42 PM  Result Value Ref Range   Glucose-Capillary 279 (H) 70 - 99 mg/dL    Comment: Glucose reference range applies only to samples taken after fasting for at least 8  hours.  Glucose, capillary     Status: Abnormal   Collection Time: 11/17/20  8:38 PM  Result Value Ref Range   Glucose-Capillary 258 (H) 70 - 99 mg/dL    Comment: Glucose reference range applies only to samples taken after fasting for at least 8 hours.  Glucose, capillary     Status: Abnormal   Collection Time: 11/18/20  9:19 AM  Result Value Ref Range   Glucose-Capillary 216 (H) 70 - 99 mg/dL    Comment: Glucose reference range applies only to samples taken after fasting for at least 8 hours.  Glucose, capillary     Status: Abnormal   Collection Time: 11/18/20 12:12 PM  Result Value Ref Range   Glucose-Capillary 281 (H) 70 - 99 mg/dL    Comment: Glucose reference range applies only to samples taken after fasting for at least 8 hours.  Glucose, capillary     Status: Abnormal   Collection Time: 11/18/20  4:15 PM  Result Value Ref Range   Glucose-Capillary 371 (H) 70 - 99 mg/dL    Comment: Glucose reference range applies only to samples taken after fasting for at least 8 hours.  Glucose, capillary     Status: Abnormal   Collection Time: 11/18/20  8:23 PM  Result Value Ref Range   Glucose-Capillary 306 (H) 70 - 99 mg/dL    Comment: Glucose reference range applies only to samples taken after fasting for at least 8 hours.  CBC     Status: Abnormal   Collection Time: 11/19/20  5:57 AM  Result Value Ref Range   WBC 11.5 (H) 4.0 - 10.5 K/uL   RBC 4.86 4.22 - 5.81 MIL/uL   Hemoglobin 14.4 13.0 - 17.0 g/dL   HCT 42.1 39.0 - 52.0 %   MCV 86.6 80.0 - 100.0 fL   MCH 29.6 26.0 - 34.0 pg   MCHC 34.2 30.0 - 36.0 g/dL   RDW 15.2 11.5 - 15.5 %   Platelets 169 150 - 400 K/uL   nRBC 0.0 0.0 - 0.2 %    Comment: Performed at [facility], [address] [place], [place] [address]  Comprehensive metabolic panel     Status: Abnormal   Collection Time: 11/19/20  5:57 AM  Result Value Ref Range   Sodium 141 135 - 145 mmol/L   Potassium 5.0 3.5 - 5.1 mmol/L   Chloride 116 (H) 98 - 111  mmol/L   CO2 16 (L) 22 - 32 mmol/L   Glucose, Bld 271 (H) 70 - 99 mg/dL    Comment: Glucose reference range applies only to samples taken after fasting for at least 8 hours.   BUN 84 (H) 8 - 23 mg/dL   Creatinine, Ser 4.09 (H) 0.61 - 1.24 mg/dL   Calcium 9.1 8.9 - 10.3 mg/dL   Total  Protein 7.0 6.5 - 8.1 g/dL   Albumin 3.2 (L) 3.5 - 5.0 g/dL   AST 27 15 - 41 U/L   ALT 218 (H) 0 - 44 U/L   Alkaline Phosphatase 106 38 - 126 U/L   Total Bilirubin 1.3 (H) 0.3 - 1.2 mg/dL   GFR, Estimated 15 (L) >60 mL/min    Comment: (NOTE) Calculated using the CKD-EPI Creatinine Equation (2021)    Anion gap 9 5 - 15    Comment: Performed at [facility], [address] [place], [place] [address]  Glucose, capillary     Status: Abnormal   Collection Time: 11/19/20 10:11 AM  Result Value Ref Range   Glucose-Capillary 265 (H) 70 - 99 mg/dL    Comment: Glucose reference range applies only to samples taken after fasting for at least 8 hours.  Glucose, capillary     Status: Abnormal   Collection Time: 11/19/20 12:33 PM  Result Value Ref Range   Glucose-Capillary 272 (H) 70 - 99 mg/dL    Comment: Glucose reference range applies only to samples taken after fasting for at least 8 hours.  Legionella Pneumophila Serogp 1 Ur Ag     Status: None   Collection Time: 11/19/20  2:26 PM  Result Value Ref Range   L. pneumophila Serogp 1 Ur Ag Negative Negative    Comment: (NOTE) Presumptive negative for L. pneumophila serogroup 1 antigen in urine, suggesting no recent or current infection. Legionnaires' disease cannot be ruled out since other serogroups and species may also cause disease. Performed At: [facility] [place], [place] [id number] Rush Farmer MD [id number]    Source of Sample URINE, RANDOM     Comment: Performed at [facility], [address] [place], [place] [address]  Glucose, capillary     Status: Abnormal   Collection Time: 11/19/20  4:00 PM   Result Value Ref Range   Glucose-Capillary 266 (H) 70 - 99 mg/dL    Comment: Glucose reference range applies only to samples taken after fasting for at least 8 hours.  Glucose, capillary     Status: Abnormal   Collection Time: 11/19/20  8:29 PM  Result Value Ref Range   Glucose-Capillary 195 (H) 70 - 99 mg/dL    Comment: Glucose reference range applies only to samples taken after fasting for at least 8 hours.  CBC     Status: Abnormal   Collection Time: 11/20/20  5:05 AM  Result Value Ref Range   WBC 10.3 4.0 - 10.5 K/uL   RBC 4.43 4.22 - 5.81 MIL/uL   Hemoglobin 13.1 13.0 - 17.0 g/dL   HCT 38.1 (L) 39.0 - 52.0 %   MCV 86.0 80.0 - 100.0 fL   MCH 29.6 26.0 - 34.0 pg   MCHC 34.4 30.0 - 36.0 g/dL   RDW 15.1 11.5 - 15.5 %   Platelets 149 (L) 150 - 400 K/uL   nRBC 0.0 0.0 - 0.2 %    Comment: Performed at [facility], [address] [place], [place] [address]  Basic metabolic panel     Status: Abnormal   Collection Time: 11/20/20  5:05 AM  Result Value Ref Range   Sodium 147 (H) 135 - 145 mmol/L   Potassium 4.8 3.5 - 5.1 mmol/L   Chloride 122 (H) 98 - 111 mmol/L   CO2 20 (L) 22 - 32 mmol/L   Glucose, Bld 156 (H) 70 - 99 mg/dL    Comment: Glucose reference  range applies only to samples taken after fasting for at least 8 hours.   BUN 56 (H) 8 - 23 mg/dL   Creatinine, Ser 2.07 (H) 0.61 - 1.24 mg/dL   Calcium 8.9 8.9 - 10.3 mg/dL   GFR, Estimated 34 (L) >60 mL/min    Comment: (NOTE) Calculated using the CKD-EPI Creatinine Equation (2021)    Anion gap 5 5 - 15    Comment: Performed at [facility], [address] [place], [place] [address]  Glucose, capillary     Status: Abnormal   Collection Time: 11/20/20  7:23 AM  Result Value Ref Range   Glucose-Capillary 164 (H) 70 - 99 mg/dL    Comment: Glucose reference range applies only to samples taken after fasting for at least 8 hours.  Glucose, capillary     Status: Abnormal   Collection Time: 11/20/20 12:15 PM   Result Value Ref Range   Glucose-Capillary 180 (H) 70 - 99 mg/dL    Comment: Glucose reference range applies only to samples taken after fasting for at least 8 hours.  CBC with Differential     Status: Abnormal   Collection Time: 11/29/20  8:57 AM  Result Value Ref Range   WBC 9.7 4.0 - 10.5 K/uL   RBC 4.76 4.22 - 5.81 MIL/uL   Hemoglobin 14.2 13.0 - 17.0 g/dL   HCT 41.7 39.0 - 52.0 %   MCV 87.6 80.0 - 100.0 fL   MCH 29.8 26.0 - 34.0 pg   MCHC 34.1 30.0 - 36.0 g/dL   RDW 14.6 11.5 - 15.5 %   Platelets 185 150 - 400 K/uL   nRBC 0.0 0.0 - 0.2 %   Neutrophils Relative % 86 %   Neutro Abs 8.3 (H) 1.7 - 7.7 K/uL   Lymphocytes Relative 7 %   Lymphs Abs 0.7 0.7 - 4.0 K/uL   Monocytes Relative 6 %   Monocytes Absolute 0.6 0.1 - 1.0 K/uL   Eosinophils Relative 0 %   Eosinophils Absolute 0.0 0.0 - 0.5 K/uL   Basophils Relative 0 %   Basophils Absolute 0.0 0.0 - 0.1 K/uL   Immature Granulocytes 1 %   Abs Immature Granulocytes 0.06 0.00 - 0.07 K/uL    Comment: Performed at [facility], [address] [place], [place] [address]  Comprehensive metabolic panel     Status: Abnormal   Collection Time: 11/29/20  8:57 AM  Result Value Ref Range   Sodium 131 (L) 135 - 145 mmol/L   Potassium 4.8 3.5 - 5.1 mmol/L   Chloride 99 98 - 111 mmol/L   CO2 23 22 - 32 mmol/L   Glucose, Bld 361 (H) 70 - 99 mg/dL    Comment: Glucose reference range applies only to samples taken after fasting for at least 8 hours.   BUN 24 (H) 8 - 23 mg/dL   Creatinine, Ser 1.16 0.61 - 1.24 mg/dL   Calcium 9.0 8.9 - 10.3 mg/dL   Total Protein 6.6 6.5 - 8.1 g/dL   Albumin 3.3 (L) 3.5 - 5.0 g/dL   AST 35 15 - 41 U/L   ALT 83 (H) 0 - 44 U/L   Alkaline Phosphatase 97 38 - 126 U/L   Total Bilirubin 1.1 0.3 - 1.2 mg/dL   GFR, Estimated >60 >60 mL/min    Comment: (NOTE) Calculated using the CKD-EPI Creatinine Equation (2021)    Anion gap 9 5 - 15    Comment: Performed at [facility], [address] [place], [place] [address]  BLADDER SCAN AMB NON-IMAGING     Status: None   Collection Time: 12/05/20  1:28 PM  Result Value Ref Range   Scan Result 731m   Bladder Scan (Post Void Residual) in office     Status: None   Collection Time: 12/12/20  2:39 PM  Result Value Ref Range   Scan Result >7336m  Comprehensive metabolic panel     Status: Abnormal   Collection Time: 12/13/20  9:01 AM  Result Value Ref Range   Sodium 133 (L) 135 - 145 mmol/L   Potassium 4.1 3.5 - 5.1 mmol/L   Chloride 101 98 - 111 mmol/L   CO2 22 22 - 32 mmol/L   Glucose, Bld 161 (H) 70 - 99 mg/dL    Comment: Glucose reference range applies only to samples taken after fasting for at least 8 hours.   BUN 21 8 - 23 mg/dL   Creatinine, Ser 1.04 0.61 - 1.24 mg/dL   Calcium 9.2 8.9 - 10.3 mg/dL   Total Protein 6.7 6.5 - 8.1 g/dL   Albumin 3.4 (L) 3.5 - 5.0 g/dL   AST 25 15 - 41 U/L   ALT 80 (H) 0 - 44 U/L   Alkaline Phosphatase 85 38 - 126 U/L   Total Bilirubin 0.7 0.3 - 1.2 mg/dL   GFR, Estimated >60 >60 mL/min    Comment: (NOTE) Calculated using the CKD-EPI Creatinine Equation (2021)    Anion gap 10 5 - 15    Comment: Performed at [facility] [id number] with Differential     Status: Abnormal   Collection Time: 12/13/20  9:01 AM  Result Value Ref Range   WBC 6.0 4.0 - 10.5 K/uL   RBC 4.87 4.22 - 5.81 MIL/uL   Hemoglobin 14.9 13.0 - 17.0 g/dL   HCT 42.5 39.0 - 52.0 %   MCV 87.3 80.0 - 100.0 fL   MCH 30.6 26.0 - 34.0 pg   MCHC 35.1 30.0 - 36.0 g/dL   RDW 15.3 11.5 - 15.5 %   Platelets 148 (L) 150 - 400 K/uL   nRBC 0.0 0.0 - 0.2 %   Neutrophils Relative % 68 %   Neutro Abs 4.1 1.7 - 7.7 K/uL   Lymphocytes Relative 19 %   Lymphs Abs 1.1 0.7 - 4.0 K/uL   Monocytes Relative 11 %   Monocytes Absolute 0.7 0.1 - 1.0 K/uL   Eosinophils Relative 1 %   Eosinophils Absolute 0.0 0.0 - 0.5 K/uL   Basophils Relative 0 %   Basophils Absolute 0.0 0.0 - 0.1 K/uL    Immature Granulocytes 1 %   Abs Immature Granulocytes 0.06 0.00 - 0.07 K/uL    Comment: Performed [facility] [id number] panel     Status: None   Collection Time: 12/13/20  9:01 AM  Result Value Ref Range   Cholesterol 159 0 - 200 mg/dL   Triglycerides 71 <150 mg/dL   HDL 59 >40 mg/dL   Total CHOL/HDL Ratio 2.7 RATIO   VLDL 14 0 - 40 mg/dL   LDL Cholesterol 86 0 - 99 mg/dL    Comment:        Total Cholesterol/HDL:CHD Risk Coronary Heart Disease Risk Table                     Men   Women  1/2  Average Risk   3.4   3.3  Average Risk       5.0   4.4  2 X Average Risk   9.6   7.1  3 X Average Risk  23.4   11.0        Use the calculated Patient Ratio above and the CHD Risk Table to determine the patient's CHD Risk.        ATP III CLASSIFICATION (LDL):  <100     mg/dL   Optimal  100-129  mg/dL   Near or Above                    Optimal  130-159  mg/dL   Borderline  160-189  mg/dL   High  >190     mg/dL   Very High Performed at [facility], [address] [place], [place] [address]   CULTURE, URINE COMPREHENSIVE     Status: Abnormal   Collection Time: 12/24/20 11:04 AM   Specimen: Urine   UR  Result Value Ref Range   Urine Culture, Comprehensive Final report (A)    Organism ID, Bacteria Comment (A)     Comment: Vancomycin-resistant Enterococcus (Enterococcus faecalis) Greater than 100,000 colony forming units per mL    ANTIMICROBIAL SUSCEPTIBILITY Comment     Comment:       ** S = Susceptible; I = Intermediate; R = Resistant **                    P = Positive; N = Negative             MICS are expressed in micrograms per mL    Antibiotic                 RSLT#1    RSLT#2    RSLT#3    RSLT#4 Ciprofloxacin                  R Levofloxacin                   R Linezolid                      S Nitrofurantoin                 S Penicillin                     S Tetracycline                   R Vancomycin                      R   Urinalysis, Complete     Status: Abnormal   Collection Time: 12/24/20 11:04 AM  Result Value Ref Range   Specific Gravity, UA 1.010 1.005 - 1.030   pH, UA 5.0 5.0 - 7.5   Color, UA Straw Yellow   Appearance Ur Hazy (A) Clear   Leukocytes,UA Trace (A) Negative   Protein,UA 1+ (A) Negative/Trace   Glucose, UA 3+ (A) Negative   Ketones, UA Negative Negative   RBC, UA 2+ (A) Negative   Bilirubin, UA Negative Negative   Urobilinogen, Ur 0.2 0.2 - 1.0 mg/dL   Nitrite, UA Negative Negative   Microscopic Examination See below:   Microscopic Examination     Status: Abnormal   Collection Time: 12/24/20 11:04 AM   Urine  Result Value  Ref Range   WBC, UA 6-10 (A) 0 - 5 /hpf   RBC 3-10 (A) 0 - 2 /hpf   Epithelial Cells (non renal) 0-10 0 - 10 /hpf   Crystals Present (A) N/A   Crystal Type Amorphous Sediment N/A   Bacteria, UA Few None seen/Few  Comprehensive metabolic panel     Status: Abnormal   Collection Time: 12/27/20  1:52 PM  Result Value Ref Range   Sodium 137 135 - 145 mmol/L   Potassium 4.1 3.5 - 5.1 mmol/L   Chloride 103 98 - 111 mmol/L   CO2 24 22 - 32 mmol/L   Glucose, Bld 325 (H) 70 - 99 mg/dL    Comment: Glucose reference range applies only to samples taken after fasting for at least 8 hours.   BUN 13 8 - 23 mg/dL   Creatinine, Ser 1.09 0.61 - 1.24 mg/dL   Calcium 9.5 8.9 - 10.3 mg/dL   Total Protein 6.5 6.5 - 8.1 g/dL   Albumin 3.3 (L) 3.5 - 5.0 g/dL   AST 21 15 - 41 U/L   ALT 41 0 - 44 U/L   Alkaline Phosphatase 65 38 - 126 U/L   Total Bilirubin 0.5 0.3 - 1.2 mg/dL   GFR, Estimated >60 >60 mL/min    Comment: (NOTE) Calculated using the CKD-EPI Creatinine Equation (2021)    Anion gap 10 5 - 15    Comment: Performed at [facility], [address] [place], [place] [address]  CBC with Differential     Status: Abnormal   Collection Time: 12/27/20  1:52 PM  Result Value Ref Range   WBC 6.8 4.0 - 10.5 K/uL   RBC 4.65 4.22 - 5.81 MIL/uL    Hemoglobin 14.1 13.0 - 17.0 g/dL   HCT 41.5 39.0 - 52.0 %   MCV 89.2 80.0 - 100.0 fL   MCH 30.3 26.0 - 34.0 pg   MCHC 34.0 30.0 - 36.0 g/dL   RDW 15.4 11.5 - 15.5 %   Platelets 142 (L) 150 - 400 K/uL   nRBC 0.0 0.0 - 0.2 %   Neutrophils Relative % 59 %   Neutro Abs 4.0 1.7 - 7.7 K/uL   Lymphocytes Relative 29 %   Lymphs Abs 1.9 0.7 - 4.0 K/uL   Monocytes Relative 10 %   Monocytes Absolute 0.7 0.1 - 1.0 K/uL   Eosinophils Relative 1 %   Eosinophils Absolute 0.1 0.0 - 0.5 K/uL   Basophils Relative 0 %   Basophils Absolute 0.0 0.0 - 0.1 K/uL   Immature Granulocytes 1 %   Abs Immature Granulocytes 0.09 (H) 0.00 - 0.07 K/uL    Comment: Performed at [facility], [address] [place], [place] [address]  CBG monitoring, ED     Status: Abnormal   Collection Time: 12/28/20  4:59 PM  Result Value Ref Range   Glucose-Capillary 362 (H) 70 - 99 mg/dL    Comment: Glucose reference range applies only to samples taken after fasting for at least 8 hours.  Urinalysis, Complete w Microscopic Urine, Catheterized     Status: Abnormal   Collection Time: 12/28/20  5:01 PM  Result Value Ref Range   Color, Urine AMBER (A) YELLOW    Comment: BIOCHEMICALS MAY BE AFFECTED BY COLOR   APPearance CLOUDY (A) CLEAR   Specific Gravity, Urine 1.013 1.005 - 1.030   pH 6.0 5.0 - 8.0   Glucose, UA >=500 (A) NEGATIVE mg/dL   Hgb urine dipstick  MODERATE (A) NEGATIVE   Bilirubin Urine NEGATIVE NEGATIVE   Ketones, ur NEGATIVE NEGATIVE mg/dL   Protein, ur 100 (A) NEGATIVE mg/dL   Nitrite NEGATIVE NEGATIVE   Leukocytes,Ua SMALL (A) NEGATIVE   RBC / HPF >50 (H) 0 - 5 RBC/hpf   WBC, UA >50 (H) 0 - 5 WBC/hpf   Bacteria, UA RARE (A) NONE SEEN   Squamous Epithelial / LPF NONE SEEN 0 - 5    Comment: Performed at [facility], [address] [place], [place] [address]  Urine culture     Status: Abnormal   Collection Time: 12/28/20  5:01 PM   Specimen: Urine, Random  Result Value Ref Range    Specimen Description      URINE, RANDOM Performed at [facility], [address] [place], [place] [address]    Special Requests      NONE Performed at [facility], [address], [place], [place] [address]    Culture (A)     >=100,000 COLONIES/mL PSEUDOMONAS AERUGINOSA >=100,000 COLONIES/mL VANCOMYCIN RESISTANT ENTEROCOCCUS    Report Status 12/31/2020 FINAL    Organism ID, Bacteria PSEUDOMONAS AERUGINOSA (A)    Organism ID, Bacteria VANCOMYCIN RESISTANT ENTEROCOCCUS (A)       Susceptibility   Pseudomonas aeruginosa - MIC*    CEFTAZIDIME 4 SENSITIVE Sensitive     CIPROFLOXACIN <=0.25 SENSITIVE Sensitive     GENTAMICIN <=1 SENSITIVE Sensitive     IMIPENEM 2 SENSITIVE Sensitive     CEFEPIME 2 SENSITIVE Sensitive     * >=100,000 COLONIES/mL PSEUDOMONAS AERUGINOSA   Vancomycin resistant enterococcus - MIC*    AMPICILLIN <=2 SENSITIVE Sensitive     NITROFURANTOIN <=16 SENSITIVE Sensitive     VANCOMYCIN >=32 RESISTANT Resistant     LINEZOLID 2 SENSITIVE Sensitive     * >=100,000 COLONIES/mL VANCOMYCIN RESISTANT ENTEROCOCCUS  CBC with Differential     Status: None   Collection Time: 12/28/20  5:02 PM  Result Value Ref Range   WBC 7.9 4.0 - 10.5 K/uL   RBC 4.73 4.22 - 5.81 MIL/uL   Hemoglobin 14.1 13.0 - 17.0 g/dL   HCT 42.0 39.0 - 52.0 %   MCV 88.8 80.0 - 100.0 fL   MCH 29.8 26.0 - 34.0 pg   MCHC 33.6 30.0 - 36.0 g/dL   RDW 15.4 11.5 - 15.5 %   Platelets 166 150 - 400 K/uL   nRBC 0.0 0.0 - 0.2 %   Neutrophils Relative % 60 %   Neutro Abs 4.7 1.7 - 7.7 K/uL   Lymphocytes Relative 28 %   Lymphs Abs 2.2 0.7 - 4.0 K/uL   Monocytes Relative 10 %   Monocytes Absolute 0.8 0.1 - 1.0 K/uL   Eosinophils Relative 1 %   Eosinophils Absolute 0.0 0.0 - 0.5 K/uL   Basophils Relative 0 %   Basophils Absolute 0.0 0.0 - 0.1 K/uL   Immature Granulocytes 1 %   Abs Immature Granulocytes 0.07 0.00 - 0.07 K/uL    Comment: Performed at [facility], [address] [place], [place] [address]  Comprehensive metabolic panel     Status: Abnormal   Collection Time: 12/28/20  5:02 PM  Result Value Ref Range   Sodium 132 (L) 135 - 145 mmol/L   Potassium 4.1 3.5 - 5.1 mmol/L   Chloride 101 98 - 111 mmol/L   CO2 20 (L) 22 - 32 mmol/L   Glucose, Bld 368 (H) 70 - 99 mg/dL  Comment: Glucose reference range applies only to samples taken after fasting for at least 8 hours.   BUN 17 8 - 23 mg/dL   Creatinine, Ser 1.52 (H) 0.61 - 1.24 mg/dL   Calcium 10.1 8.9 - 10.3 mg/dL   Total Protein 7.2 6.5 - 8.1 g/dL   Albumin 3.7 3.5 - 5.0 g/dL   AST 19 15 - 41 U/L   ALT 38 0 - 44 U/L   Alkaline Phosphatase 67 38 - 126 U/L   Total Bilirubin 0.7 0.3 - 1.2 mg/dL   GFR, Estimated 50 (L) >60 mL/min    Comment: (NOTE) Calculated using the CKD-EPI Creatinine Equation (2021)    Anion gap 11 5 - 15    Comment: Performed at [facility], [place], [place] [address]  Troponin I (High Sensitivity)     Status: None   Collection Time: 12/28/20  5:02 PM  Result Value Ref Range   Troponin I (High Sensitivity) 11 <18 ng/L    Comment: (NOTE) Elevated high sensitivity troponin I (hsTnI) values and significant  changes across serial measurements may suggest ACS but many other  chronic and acute conditions are known to elevate hsTnI results.  Refer to the "Links" section for chest pain algorithms and additional  guidance. Performed at [facility], [address] [place], [place] [address]   Brain natriuretic peptide     Status: None   Collection Time: 12/28/20  5:02 PM  Result Value Ref Range   B Natriuretic Peptide 38.3 0.0 - 100.0 pg/mL    Comment: Performed at [facility], [address] [place], [place] [address]  Lactic acid, plasma     Status: Abnormal   Collection Time: 12/28/20  5:02 PM  Result Value Ref Range   Lactic Acid, Venous 2.7 (HH) 0.5 - 1.9 mmol/L    Comment: CRITICAL RESULT CALLED TO, READ BACK BY AND  VERIFIED WITH JACQUE FERGUSON ON 12/28/20 AT 1739 QSD Performed at [facility], [address] [place], [place] [address]   Resp Panel by RT-PCR (Flu A&B, Covid) Nasopharyngeal Swab     Status: None   Collection Time: 12/28/20  5:02 PM   Specimen: Nasopharyngeal Swab; Nasopharyngeal(NP) swabs in vial transport medium  Result Value Ref Range   SARS Coronavirus 2 by RT PCR NEGATIVE NEGATIVE    Comment: (NOTE) SARS-CoV-2 target nucleic acids are NOT DETECTED.  The SARS-CoV-2 RNA is generally detectable in upper respiratory specimens during the acute phase of infection. The lowest concentration of SARS-CoV-2 viral copies this assay can detect is 138 copies/mL. A negative result does not preclude SARS-Cov-2 infection and should not be used as the sole basis for treatment or other patient management decisions. A negative result may occur with  improper specimen collection/handling, submission of specimen other than nasopharyngeal swab, presence of viral mutation(s) within the areas targeted by this assay, and inadequate number of viral copies(<138 copies/mL). A negative result must be combined with clinical observations, patient history, and epidemiological information. The expected result is Negative.  Fact Sheet for Patients:  EntrepreneurPulse.com.au  Fact Sheet for Healthcare Providers:  IncredibleEmployment.be  This test is no t yet approved or cleared by the [place] FDA and  has been authorized for detection and/or diagnosis of SARS-CoV-2 by FDA under an Emergency Use Authorization (EUA). This EUA will remain  in effect (meaning this test can be used) for the duration of the COVID-19 declaration under Section 564(b)(1) of the Act, 21 U.S.C.section  360bbb-3(b)(1), unless the authorization is terminated  or revoked sooner.       Influenza A by PCR NEGATIVE NEGATIVE   Influenza B by PCR NEGATIVE NEGATIVE    Comment:  (NOTE) The Xpert Xpress SARS-CoV-2/FLU/RSV plus assay is intended as an aid in the diagnosis of influenza from Nasopharyngeal swab specimens and should not be used as a sole basis for treatment. Nasal washings and aspirates are unacceptable for Xpert Xpress SARS-CoV-2/FLU/RSV testing.  Fact Sheet for Patients: EntrepreneurPulse.com.au  Fact Sheet for Healthcare Providers: IncredibleEmployment.be  This test is not yet approved or cleared by the [place] FDA and has been authorized for detection and/or diagnosis of SARS-CoV-2 by FDA under an Emergency Use Authorization (EUA). This EUA will remain in effect (meaning this test can be used) for the duration of the COVID-19 declaration under Section 564(b)(1) of the Act, 21 U.S.C. section 360bbb-3(b)(1), unless the authorization is terminated or revoked.  Performed at [facility], [address] [place], [place] [address]   Hemoglobin A1c     Status: Abnormal   Collection Time: 12/28/20  5:02 PM  Result Value Ref Range   Hgb A1c MFr Bld 9.5 (H) 4.8 - 5.6 %    Comment: (NOTE) Pre diabetes:          5.7%-6.4%  Diabetes:              >6.4%  Glycemic control for   <7.0% adults with diabetes    Mean Plasma Glucose 225.95 mg/dL    Comment: Performed at [facility] [address] [place], [place] [address]  Lactic acid, plasma     Status: Abnormal   Collection Time: 12/28/20  8:58 PM  Result Value Ref Range   Lactic Acid, Venous 2.0 (HH) 0.5 - 1.9 mmol/L    Comment: CRITICAL VALUE NOTED. VALUE IS CONSISTENT WITH PREVIOUSLY REPORTED/CALLED VALUE  ADL Performed at [facility], [place], [place] [address]   Troponin I (High Sensitivity)     Status: None   Collection Time: 12/28/20  8:58 PM  Result Value Ref Range   Troponin I (High Sensitivity) 16 <18 ng/L    Comment: (NOTE) Elevated high sensitivity troponin I (hsTnI) values and significant   changes across serial measurements may suggest ACS but many other  chronic and acute conditions are known to elevate hsTnI results.  Refer to the "Links" section for chest pain algorithms and additional  guidance. Performed at [facility], [address] [place], [place] [address]   Glucose, capillary     Status: Abnormal   Collection Time: 12/29/20  1:41 AM  Result Value Ref Range   Glucose-Capillary 159 (H) 70 - 99 mg/dL    Comment: Glucose reference range applies only to samples taken after fasting for at least 8 hours.  Basic metabolic panel     Status: Abnormal   Collection Time: 12/29/20  4:36 AM  Result Value Ref Range   Sodium 139 135 - 145 mmol/L    Comment: RESULTS VERIFIED BY REPEAT TESTING MF   Potassium 3.5 3.5 - 5.1 mmol/L   Chloride 109 98 - 111 mmol/L   CO2 24 22 - 32 mmol/L   Glucose, Bld 101 (H) 70 - 99 mg/dL    Comment: Glucose reference range applies only to samples taken after fasting for at least 8 hours.   BUN 12 8 - 23 mg/dL   Creatinine, Ser 1.16 0.61 - 1.24 mg/dL   Calcium 9.3  8.9 - 10.3 mg/dL   GFR, Estimated >60 >60 mL/min    Comment: (NOTE) Calculated using the CKD-EPI Creatinine Equation (2021)    Anion gap 6 5 - 15    Comment: Performed at [facility], [address] [place], [place] [address]  CBC     Status: Abnormal   Collection Time: 12/29/20  4:36 AM  Result Value Ref Range   WBC 6.1 4.0 - 10.5 K/uL   RBC 4.21 (L) 4.22 - 5.81 MIL/uL   Hemoglobin 12.7 (L) 13.0 - 17.0 g/dL   HCT 36.7 (L) 39.0 - 52.0 %   MCV 87.2 80.0 - 100.0 fL   MCH 30.2 26.0 - 34.0 pg   MCHC 34.6 30.0 - 36.0 g/dL   RDW 15.6 (H) 11.5 - 15.5 %   Platelets 143 (L) 150 - 400 K/uL   nRBC 0.0 0.0 - 0.2 %    Comment: Performed at [facility], [address] [place], [place] [address]  Glucose, capillary     Status: Abnormal   Collection Time: 12/29/20  7:59 AM  Result Value Ref Range   Glucose-Capillary 68 (L) 70 - 99 mg/dL    Comment:  Glucose reference range applies only to samples taken after fasting for at least 8 hours.  Glucose, capillary     Status: None   Collection Time: 12/29/20 12:58 PM  Result Value Ref Range   Glucose-Capillary 94 70 - 99 mg/dL    Comment: Glucose reference range applies only to samples taken after fasting for at least 8 hours.  Glucose, capillary     Status: Abnormal   Collection Time: 12/29/20  6:28 PM  Result Value Ref Range   Glucose-Capillary 162 (H) 70 - 99 mg/dL    Comment: Glucose reference range applies only to samples taken after fasting for at least 8 hours.  Glucose, capillary     Status: Abnormal   Collection Time: 12/29/20  9:17 PM  Result Value Ref Range   Glucose-Capillary 200 (H) 70 - 99 mg/dL    Comment: Glucose reference range applies only to samples taken after fasting for at least 8 hours.  CBC     Status: Abnormal   Collection Time: 12/30/20  4:44 AM  Result Value Ref Range   WBC 6.4 4.0 - 10.5 K/uL   RBC 4.47 4.22 - 5.81 MIL/uL   Hemoglobin 13.4 13.0 - 17.0 g/dL   HCT 39.4 39.0 - 52.0 %   MCV 88.1 80.0 - 100.0 fL   MCH 30.0 26.0 - 34.0 pg   MCHC 34.0 30.0 - 36.0 g/dL   RDW 15.1 11.5 - 15.5 %   Platelets 148 (L) 150 - 400 K/uL   nRBC 0.0 0.0 - 0.2 %    Comment: Performed at [facility], [address], [place], [place] [address]  Comprehensive metabolic panel     Status: Abnormal   Collection Time: 12/30/20  4:44 AM  Result Value Ref Range   Sodium 137 135 - 145 mmol/L   Potassium 5.0 3.5 - 5.1 mmol/L   Chloride 106 98 - 111 mmol/L   CO2 24 22 - 32 mmol/L   Glucose, Bld 209 (H) 70 - 99 mg/dL    Comment: Glucose reference range applies only to samples taken after fasting for at least 8 hours.   BUN 14 8 - 23 mg/dL   Creatinine, Ser 1.21 0.61 - 1.24 mg/dL   Calcium 9.5 8.9 - 10.3 mg/dL   Total Protein  6.2 (L) 6.5 - 8.1 g/dL   Albumin 2.8 (L) 3.5 - 5.0 g/dL   AST 17 15 - 41 U/L   ALT 27 0 - 44 U/L   Alkaline Phosphatase 55 38 - 126 U/L   Total  Bilirubin 0.6 0.3 - 1.2 mg/dL   GFR, Estimated >60 >60 mL/min    Comment: (NOTE) Calculated using the CKD-EPI Creatinine Equation (2021)    Anion gap 7 5 - 15    Comment: Performed at [facility], [address] [place], [place] [address]  Ammonia     Status: None   Collection Time: 12/30/20  4:44 AM  Result Value Ref Range   Ammonia 16 9 - 35 umol/L    Comment: Performed at [facility], [address] [place], [place] [address]  Glucose, capillary     Status: Abnormal   Collection Time: 12/30/20  7:56 AM  Result Value Ref Range   Glucose-Capillary 179 (H) 70 - 99 mg/dL    Comment: Glucose reference range applies only to samples taken after fasting for at least 8 hours.  Glucose, capillary     Status: Abnormal   Collection Time: 12/30/20 11:33 AM  Result Value Ref Range   Glucose-Capillary 160 (H) 70 - 99 mg/dL    Comment: Glucose reference range applies only to samples taken after fasting for at least 8 hours.  Glucose, capillary     Status: Abnormal   Collection Time: 12/30/20  4:44 PM  Result Value Ref Range   Glucose-Capillary 226 (H) 70 - 99 mg/dL    Comment: Glucose reference range applies only to samples taken after fasting for at least 8 hours.  Glucose, capillary     Status: Abnormal   Collection Time: 12/30/20  9:18 PM  Result Value Ref Range   Glucose-Capillary 253 (H) 70 - 99 mg/dL    Comment: Glucose reference range applies only to samples taken after fasting for at least 8 hours.  CBC     Status: Abnormal   Collection Time: 12/31/20  4:27 AM  Result Value Ref Range   WBC 5.0 4.0 - 10.5 K/uL   RBC 4.17 (L) 4.22 - 5.81 MIL/uL   Hemoglobin 12.6 (L) 13.0 - 17.0 g/dL   HCT 37.2 (L) 39.0 - 52.0 %   MCV 89.2 80.0 - 100.0 fL   MCH 30.2 26.0 - 34.0 pg   MCHC 33.9 30.0 - 36.0 g/dL   RDW 15.0 11.5 - 15.5 %   Platelets 154 150 - 400 K/uL   nRBC 0.0 0.0 - 0.2 %    Comment: Performed at [facility], [address] [place], [place]  [address]  Basic metabolic panel     Status: Abnormal   Collection Time: 12/31/20  4:27 AM  Result Value Ref Range   Sodium 139 135 - 145 mmol/L   Potassium 3.5 3.5 - 5.1 mmol/L   Chloride 108 98 - 111 mmol/L   CO2 25 22 - 32 mmol/L   Glucose, Bld 259 (H) 70 - 99 mg/dL    Comment: Glucose reference range applies only to samples taken after fasting for at least 8 hours.   BUN 16 8 - 23 mg/dL   Creatinine, Ser 1.28 (H) 0.61 - 1.24 mg/dL   Calcium 8.9 8.9 - 10.3 mg/dL   GFR, Estimated >60 >60 mL/min    Comment: (NOTE) Calculated using the CKD-EPI Creatinine Equation (2021)    Anion gap 6 5 - 15  Comment: Performed at [facility], [address] [place], [place] [address]  Glucose, capillary     Status: Abnormal   Collection Time: 12/31/20  8:08 AM  Result Value Ref Range   Glucose-Capillary 203 (H) 70 - 99 mg/dL    Comment: Glucose reference range applies only to samples taken after fasting for at least 8 hours.  Glucose, capillary     Status: Abnormal   Collection Time: 12/31/20 12:17 PM  Result Value Ref Range   Glucose-Capillary 244 (H) 70 - 99 mg/dL    Comment: Glucose reference range applies only to samples taken after fasting for at least 8 hours.  Glucose, capillary     Status: Abnormal   Collection Time: 12/31/20  6:02 PM  Result Value Ref Range   Glucose-Capillary 277 (H) 70 - 99 mg/dL    Comment: Glucose reference range applies only to samples taken after fasting for at least 8 hours.  Glucose, capillary     Status: Abnormal   Collection Time: 12/31/20  8:56 PM  Result Value Ref Range   Glucose-Capillary 295 (H) 70 - 99 mg/dL    Comment: Glucose reference range applies only to samples taken after fasting for at least 8 hours.  CBC     Status: Abnormal   Collection Time: 01/01/21  3:41 AM  Result Value Ref Range   WBC 4.4 4.0 - 10.5 K/uL   RBC 4.12 (L) 4.22 - 5.81 MIL/uL   Hemoglobin 12.4 (L) 13.0 - 17.0 g/dL   HCT 37.1 (L) 39.0 - 52.0 %   MCV 90.0 80.0 -  100.0 fL   MCH 30.1 26.0 - 34.0 pg   MCHC 33.4 30.0 - 36.0 g/dL   RDW 14.9 11.5 - 15.5 %   Platelets 146 (L) 150 - 400 K/uL   nRBC 0.0 0.0 - 0.2 %    Comment: Performed at [facility], [address] [place], [place] [address]  Basic metabolic panel     Status: Abnormal   Collection Time: 01/01/21  3:41 AM  Result Value Ref Range   Sodium 139 135 - 145 mmol/L   Potassium 3.9 3.5 - 5.1 mmol/L   Chloride 110 98 - 111 mmol/L   CO2 23 22 - 32 mmol/L   Glucose, Bld 147 (H) 70 - 99 mg/dL    Comment: Glucose reference range applies only to samples taken after fasting for at least 8 hours.   BUN 13 8 - 23 mg/dL   Creatinine, Ser 1.06 0.61 - 1.24 mg/dL   Calcium 8.9 8.9 - 10.3 mg/dL   GFR, Estimated >60 >60 mL/min    Comment: (NOTE) Calculated using the CKD-EPI Creatinine Equation (2021)    Anion gap 6 5 - 15    Comment: Performed at [facility], [address] [place], [place] [address]  Glucose, capillary     Status: Abnormal   Collection Time: 01/01/21  8:51 AM  Result Value Ref Range   Glucose-Capillary 124 (H) 70 - 99 mg/dL    Comment: Glucose reference range applies only to samples taken after fasting for at least 8 hours.  Glucose, capillary     Status: Abnormal   Collection Time: 01/01/21 11:44 AM  Result Value Ref Range   Glucose-Capillary 229 (H) 70 - 99 mg/dL    Comment: Glucose reference range applies only to samples taken after fasting for at least 8 hours.  CULTURE, URINE COMPREHENSIVE     Status: None (Preliminary result)  Collection Time: 01/06/21 10:15 AM   Specimen: Urine   UR  Result Value Ref Range   Urine Culture, Comprehensive Preliminary report    Organism ID, Bacteria Comment     Comment: No growth after 18-24 hours.      PHQ2/9: Depression screen [facility] 2/9 01/09/2021 10/31/2020 07/26/2020 04/24/2020 04/18/2020  Decreased Interest 2 0 0 0 0  Down, Depressed, Hopeless 2 0 0 0 0  PHQ - 2 Score 4 0 0 0 0  Altered sleeping 0 - - 0 -   Tired, decreased energy 0 - - 0 -  Change in appetite 0 - - 0 -  Feeling bad or failure about yourself  0 - - 0 -  Trouble concentrating 0 - - 0 -  Moving slowly or fidgety/restless 0 - - 0 -  Suicidal thoughts 0 - - 0 -  PHQ-9 Score 4 - - 0 -  Difficult doing work/chores - - - Not difficult at all -  Some recent data might be hidden    phq 9 is positive   Fall Risk: Fall Risk  01/09/2021 10/31/2020 07/26/2020 04/24/2020 04/18/2020  Falls in the past year? 0 0 0 0 0  Number falls in past yr: 0 0 0 0 0  Injury with Fall? 0 0 0 0 0  Risk for fall due to : - - - - No Fall Risks  Follow up - - - - Falls prevention discussed      Functional Status Survey: Is the patient deaf or have difficulty hearing?: No Does the patient have difficulty seeing, even when wearing glasses/contacts?: No Does the patient have difficulty concentrating, remembering, or making decisions?: Yes Does the patient have difficulty walking or climbing stairs?: Yes Does the patient have difficulty dressing or bathing?: Yes Does the patient have difficulty doing errands alone such as visiting a doctor's office or shopping?: Yes    Assessment & Plan  1. Hospital discharge follow-up  - BASIC METABOLIC PANEL WITH GFR  2. Acute cystitis without hematuria  On antibiotics, due to urinary retention  3. Acute deep vein thrombosis (DVT) of popliteal vein of both lower extremities (HCC)  IVC filer placed   4. Uncontrolled type 2 diabetes mellitus with hyperglycemia (HCC)  Improving, discussed medication adjustment   5. Thrombocytopenia ([place])  Recheck next visit if not done by oncologist   6. 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

## 2021-01-07 NOTE — Telephone Encounter (Signed)
Pt has an appt on 01/09/21

## 2021-01-07 NOTE — Telephone Encounter (Unsigned)
Copied from [facility] [id number]. Topic: Quick Communication - Home Health Verbal Orders >> Jan 07, 2021 11:18 AM Yvette Rack wrote: Caller/Agency: Joya Gaskins with [facility] Callback Number: [phone] secure line Requesting OT/PT/Skilled Nursing/Social Work/Speech Therapy: PT Frequency: 1 week 2, 2 week 4, and 1 week 2

## 2021-01-07 NOTE — Telephone Encounter (Signed)
Notes to clinic:  medication requested has been d/c  Review for continued use and refill   Requested Prescriptions  Pending Prescriptions Disp Refills   metFORMIN (GLUCOPHAGE-XR) 750 MG 24 hr tablet [Pharmacy Med Name: metFORMIN HCl ER 750 MG Oral Tablet Extended Release 24 Hour] 180 tablet 0    Sig: Take 2 tablets by mouth once daily      Endocrinology:  Diabetes - Biguanides Failed - 01/07/2021 10:07 AM      Failed - HBA1C is between 0 and 7.9 and within 180 days    HbA1c, POC (controlled diabetic range)  Date Value Ref Range Status  04/07/2019 9.8 (A) 0.0 - 7.0 % Final   Hgb A1c MFr Bld  Date Value Ref Range Status  12/28/2020 9.5 (H) 4.8 - 5.6 % Final    Comment:    (NOTE) Pre diabetes:          5.7%-6.4%  Diabetes:              >6.4%  Glycemic control for   <7.0% adults with diabetes           Failed - AA eGFR in normal range and within 360 days    GFR, Est African American  Date Value Ref Range Status  07/26/2020 52 (L) > OR = 60 mL/min/1.28m Final   GFR, Est Non African American  Date Value Ref Range Status  07/26/2020 45 (L) > OR = 60 mL/min/1.765mFinal   GFR, Estimated  Date Value Ref Range Status  01/01/2021 >60 >60 mL/min Final    Comment:    (NOTE) Calculated using the CKD-EPI Creatinine Equation (2021)           Passed - Cr in normal range and within 360 days    Creat  Date Value Ref Range Status  07/26/2020 1.58 (H) 0.70 - 1.25 mg/dL Final    Comment:    For patients >4969ears of age, the reference limit for Creatinine is approximately 13% higher for people identified as African-American. .    Creatinine, Ser  Date Value Ref Range Status  01/01/2021 1.06 0.61 - 1.24 mg/dL Final   Creatinine, Urine  Date Value Ref Range Status  03/29/2020 156 mg/dL Final          Passed - Valid encounter within last 6 months    Recent Outpatient Visits           2 months ago [facility]controlled type 2 [facility] mellitus with [facility]   5 months ago Controlled type 2 diabetes mellitus with chronic kidney disease, without long-term current use of insulin, unspecified CKD stage [facility]   8 months ago Controlled type 2 diabetes mellitus with chronic kidney disease, without long-term current use of insulin, unspecified CKD stage ([facility]in[facility]   1 year ago Acute diverticulitis   [place] [place]   1 year ago DM type 2 with diabetic dyslipidemia [facility]       Future Appointments             In 2 days SoSteele SizerMD [facility] 1 week [facility] In 4 weeks  Sowles, Krichna, MD [facility]   In 2 months Sninsky, Brian C, MD [facility]                   

## 2021-01-08 DIAGNOSIS — B965 Pseudomonas (aeruginosa) (mallei) (pseudomallei) as the cause of diseases classified elsewhere: Secondary | ICD-10-CM | POA: Diagnosis not present

## 2021-01-08 DIAGNOSIS — N390 Urinary tract infection, site not specified: Secondary | ICD-10-CM | POA: Diagnosis not present

## 2021-01-08 DIAGNOSIS — C3412 Malignant neoplasm of upper lobe, left bronchus or lung: Secondary | ICD-10-CM | POA: Diagnosis not present

## 2021-01-08 DIAGNOSIS — N401 Enlarged prostate with lower urinary tract symptoms: Secondary | ICD-10-CM | POA: Diagnosis not present

## 2021-01-08 DIAGNOSIS — R338 Other retention of urine: Secondary | ICD-10-CM | POA: Diagnosis not present

## 2021-01-08 DIAGNOSIS — Z1621 Resistance to vancomycin: Secondary | ICD-10-CM | POA: Diagnosis not present

## 2021-01-08 NOTE — Telephone Encounter (Signed)
Verbal order given  

## 2021-01-08 NOTE — Telephone Encounter (Signed)
Called to give verbal order,voicemail full and unable to leave a message. Will attempt to reach later.

## 2021-01-09 ENCOUNTER — Other Ambulatory Visit: Payer: Self-pay | Admitting: *Deleted

## 2021-01-09 ENCOUNTER — Encounter: Payer: Self-pay | Admitting: Family Medicine

## 2021-01-09 ENCOUNTER — Other Ambulatory Visit: Payer: Self-pay

## 2021-01-09 ENCOUNTER — Ambulatory Visit (INDEPENDENT_AMBULATORY_CARE_PROVIDER_SITE_OTHER): Payer: Medicare Other | Admitting: Family Medicine

## 2021-01-09 VITALS — BP 122/84 | HR 84 | Temp 98.6°F | Resp 16 | Ht 64.0 in | Wt 157.0 lb

## 2021-01-09 DIAGNOSIS — I82542 Chronic embolism and thrombosis of left tibial vein: Secondary | ICD-10-CM | POA: Diagnosis not present

## 2021-01-09 DIAGNOSIS — E1165 Type 2 diabetes mellitus with hyperglycemia: Secondary | ICD-10-CM

## 2021-01-09 DIAGNOSIS — D696 Thrombocytopenia, unspecified: Secondary | ICD-10-CM | POA: Diagnosis not present

## 2021-01-09 DIAGNOSIS — C3412 Malignant neoplasm of upper lobe, left bronchus or lung: Secondary | ICD-10-CM

## 2021-01-09 DIAGNOSIS — I82433 Acute embolism and thrombosis of popliteal vein, bilateral: Secondary | ICD-10-CM

## 2021-01-09 DIAGNOSIS — I2699 Other pulmonary embolism without acute cor pulmonale: Secondary | ICD-10-CM

## 2021-01-09 DIAGNOSIS — Z09 Encounter for follow-up examination after completed treatment for conditions other than malignant neoplasm: Secondary | ICD-10-CM | POA: Diagnosis not present

## 2021-01-09 DIAGNOSIS — N3000 Acute cystitis without hematuria: Secondary | ICD-10-CM | POA: Diagnosis not present

## 2021-01-09 LAB — CULTURE, URINE COMPREHENSIVE

## 2021-01-09 LAB — BASIC METABOLIC PANEL WITH GFR
BUN: 12 mg/dL (ref 7–25)
CO2: 23 mmol/L (ref 20–32)
Calcium: 10.1 mg/dL (ref 8.6–10.3)
Chloride: 104 mmol/L (ref 98–110)
Creat: 1.2 mg/dL (ref 0.70–1.25)
GFR, Est African American: 72 mL/min/{1.73_m2} (ref >=60)
GFR, Est Non African American: 62 mL/min/{1.73_m2} (ref >=60)
Glucose, Bld: 246 mg/dL — ABNORMAL HIGH (ref 65–99)
Potassium: 4 mmol/L (ref 3.5–5.3)
Sodium: 137 mmol/L (ref 135–146)

## 2021-01-09 MED ORDER — LACTATED RINGERS IV SOLN: INTRAVENOUS | Status: DC

## 2021-01-09 NOTE — Patient Instructions (Signed)
Consider taking half of glipizide 5 mg twice daily and increase dose of Soliqua by 2 units every 3 days to keep fasting between 100-140 Taking glipizide and Soliqua can cause hypoglycemia

## 2021-01-10 ENCOUNTER — Ambulatory Visit: Payer: Medicare Other | Admitting: Anesthesiology

## 2021-01-10 ENCOUNTER — Encounter
Admission: RE | Disposition: A | Discharge status: Home / Self Care | Payer: Self-pay | Source: Home / Self Care | Attending: Urology

## 2021-01-10 ENCOUNTER — Ambulatory Visit
Admission: RE | Admit: 2021-01-10 | Discharge: 2021-01-10 | Disposition: A | Discharge status: Home / Self Care | Payer: Medicare Other | Attending: Urology | Admitting: Urology

## 2021-01-10 ENCOUNTER — Inpatient Hospital Stay: Payer: Medicare Other

## 2021-01-10 ENCOUNTER — Inpatient Hospital Stay: Payer: Medicare Other | Admitting: Internal Medicine

## 2021-01-10 ENCOUNTER — Encounter: Payer: Self-pay | Admitting: Urology

## 2021-01-10 DIAGNOSIS — Z8042 Family history of malignant neoplasm of prostate: Secondary | ICD-10-CM | POA: Insufficient documentation

## 2021-01-10 DIAGNOSIS — C7931 Secondary malignant neoplasm of brain: Secondary | ICD-10-CM | POA: Diagnosis not present

## 2021-01-10 DIAGNOSIS — C3490 Malignant neoplasm of unspecified part of unspecified bronchus or lung: Secondary | ICD-10-CM | POA: Diagnosis not present

## 2021-01-10 DIAGNOSIS — Z923 Personal history of irradiation: Secondary | ICD-10-CM | POA: Insufficient documentation

## 2021-01-10 DIAGNOSIS — R338 Other retention of urine: Secondary | ICD-10-CM

## 2021-01-10 DIAGNOSIS — N401 Enlarged prostate with lower urinary tract symptoms: Secondary | ICD-10-CM | POA: Insufficient documentation

## 2021-01-10 DIAGNOSIS — Z86711 Personal history of pulmonary embolism: Secondary | ICD-10-CM | POA: Insufficient documentation

## 2021-01-10 DIAGNOSIS — R339 Retention of urine, unspecified: Secondary | ICD-10-CM | POA: Diagnosis not present

## 2021-01-10 DIAGNOSIS — Z86718 Personal history of other venous thrombosis and embolism: Secondary | ICD-10-CM | POA: Diagnosis not present

## 2021-01-10 DIAGNOSIS — N138 Other obstructive and reflux uropathy: Secondary | ICD-10-CM

## 2021-01-10 HISTORY — PX: HOLEP-LASER ENUCLEATION OF THE PROSTATE WITH MORCELLATION: SHX6641

## 2021-01-10 LAB — GLUCOSE, CAPILLARY: Glucose-Capillary: 158 mg/dL — ABNORMAL HIGH (ref 70–99)

## 2021-01-10 LAB — POCT I-STAT, CHEM 8
BUN: 13 mg/dL (ref 8–23)
Calcium, Ion: 1.29 mmol/L (ref 1.15–1.40)
Chloride: 108 mmol/L (ref 98–111)
Creatinine, Ser: 1.1 mg/dL (ref 0.61–1.24)
Glucose, Bld: 154 mg/dL — ABNORMAL HIGH (ref 70–99)
HCT: 41 % (ref 39.0–52.0)
Hemoglobin: 13.9 g/dL (ref 13.0–17.0)
Potassium: 4.1 mmol/L (ref 3.5–5.1)
Sodium: 139 mmol/L (ref 135–145)
TCO2: 23 mmol/L (ref 22–32)

## 2021-01-10 SURGERY — ENUCLEATION, PROSTATE, USING LASER, WITH MORCELLATION
Anesthesia: General

## 2021-01-10 MED ORDER — FENTANYL CITRATE (PF) 100 MCG/2ML IJ SOLN
25.0000 ug | INTRAMUSCULAR | Status: DC | PRN
  Administered 2021-01-10: 25 ug via INTRAVENOUS

## 2021-01-10 MED ORDER — ACETAMINOPHEN 10 MG/ML IV SOLN: 1000.0000 mg | Freq: Once | INTRAVENOUS | Status: DC | PRN

## 2021-01-10 MED ORDER — OXYCODONE HCL 5 MG/5ML PO SOLN
5.0000 mg | Freq: Once | ORAL | Status: DC | PRN
Start: 2021-01-10 — End: 2021-01-10

## 2021-01-10 MED ORDER — BELLADONNA ALKALOIDS-OPIUM 16.2-60 MG RE SUPP
RECTAL | Status: AC
  Filled 2021-01-10: qty 1

## 2021-01-10 MED ORDER — PHENYLEPHRINE HCL-NACL 10-0.9 MG/250ML-% IV SOLN
INTRAVENOUS | Status: DC | PRN
  Administered 2021-01-10: 75 ug/min via INTRAVENOUS

## 2021-01-10 MED ORDER — HYDROCODONE-ACETAMINOPHEN 5-325 MG PO TABS: 1.0000 | ORAL_TABLET | Freq: Four times a day (QID) | ORAL | 0 refills | Status: AC | PRN

## 2021-01-10 MED ORDER — FENTANYL CITRATE (PF) 100 MCG/2ML IJ SOLN
INTRAMUSCULAR | Status: AC
  Filled 2021-01-10: qty 2

## 2021-01-10 MED ORDER — ROCURONIUM BROMIDE 100 MG/10ML IV SOLN
INTRAVENOUS | Status: DC | PRN
  Administered 2021-01-10: 50 mg via INTRAVENOUS
  Administered 2021-01-10: 10 mg via INTRAVENOUS

## 2021-01-10 MED ORDER — ACETAMINOPHEN 10 MG/ML IV SOLN
INTRAVENOUS | Status: DC | PRN
  Administered 2021-01-10: 1000 mg via INTRAVENOUS

## 2021-01-10 MED ORDER — FENTANYL CITRATE (PF) 100 MCG/2ML IJ SOLN
INTRAMUSCULAR | Status: AC
  Administered 2021-01-10: 25 ug via INTRAVENOUS
  Filled 2021-01-10: qty 2

## 2021-01-10 MED ORDER — MIDAZOLAM HCL 2 MG/2ML IJ SOLN
INTRAMUSCULAR | Status: AC
  Filled 2021-01-10: qty 2

## 2021-01-10 MED ORDER — SODIUM CHLORIDE 0.9 % IV SOLN: INTRAVENOUS | Status: DC

## 2021-01-10 MED ORDER — PROPOFOL 10 MG/ML IV BOLUS
INTRAVENOUS | Status: DC | PRN
  Administered 2021-01-10: 140 mg via INTRAVENOUS

## 2021-01-10 MED ORDER — CHLORHEXIDINE GLUCONATE 0.12 % MT SOLN
OROMUCOSAL | Status: AC
  Administered 2021-01-10: 15 mL via OROMUCOSAL
  Filled 2021-01-10: qty 15

## 2021-01-10 MED ORDER — FENTANYL CITRATE (PF) 100 MCG/2ML IJ SOLN
INTRAMUSCULAR | Status: DC | PRN
  Administered 2021-01-10: 50 ug via INTRAVENOUS

## 2021-01-10 MED ORDER — DEXAMETHASONE SODIUM PHOSPHATE 10 MG/ML IJ SOLN
INTRAMUSCULAR | Status: DC | PRN
  Administered 2021-01-10: 5 mg via INTRAVENOUS

## 2021-01-10 MED ORDER — CHLORHEXIDINE GLUCONATE 0.12 % MT SOLN: 15.0000 mL | Freq: Once | OROMUCOSAL | Status: AC

## 2021-01-10 MED ORDER — ACETAMINOPHEN 10 MG/ML IV SOLN
INTRAVENOUS | Status: AC
  Filled 2021-01-10: qty 100

## 2021-01-10 MED ORDER — CIPROFLOXACIN IN D5W 400 MG/200ML IV SOLN
400.0000 mg | Freq: Once | INTRAVENOUS | Status: AC
  Administered 2021-01-10: 400 mg via INTRAVENOUS

## 2021-01-10 MED ORDER — BELLADONNA ALKALOIDS-OPIUM 16.2-60 MG RE SUPP
RECTAL | Status: DC | PRN
  Administered 2021-01-10: 1 via RECTAL

## 2021-01-10 MED ORDER — PROPOFOL 10 MG/ML IV BOLUS
INTRAVENOUS | Status: AC
  Filled 2021-01-10: qty 60

## 2021-01-10 MED ORDER — CEFAZOLIN SODIUM-DEXTROSE 2-4 GM/100ML-% IV SOLN
INTRAVENOUS | Status: AC
  Filled 2021-01-10: qty 100

## 2021-01-10 MED ORDER — LIDOCAINE HCL (CARDIAC) PF 100 MG/5ML IV SOSY
PREFILLED_SYRINGE | INTRAVENOUS | Status: DC | PRN
  Administered 2021-01-10: 80 mg via INTRAVENOUS

## 2021-01-10 MED ORDER — PHENYLEPHRINE HCL (PRESSORS) 10 MG/ML IV SOLN
INTRAVENOUS | Status: DC | PRN
  Administered 2021-01-10 (×4): 200 ug via INTRAVENOUS

## 2021-01-10 MED ORDER — ONDANSETRON HCL 4 MG/2ML IJ SOLN
INTRAMUSCULAR | Status: DC | PRN
  Administered 2021-01-10: 4 mg via INTRAVENOUS

## 2021-01-10 MED ORDER — EPHEDRINE SULFATE 50 MG/ML IJ SOLN
INTRAMUSCULAR | Status: DC | PRN
  Administered 2021-01-10 (×4): 10 mg via INTRAVENOUS

## 2021-01-10 MED ORDER — SUGAMMADEX SODIUM 200 MG/2ML IV SOLN
INTRAVENOUS | Status: DC | PRN
  Administered 2021-01-10: 200 mg via INTRAVENOUS

## 2021-01-10 MED ORDER — OXYCODONE HCL 5 MG PO TABS: 5.0000 mg | ORAL_TABLET | Freq: Once | ORAL | Status: DC | PRN

## 2021-01-10 MED ORDER — SODIUM CHLORIDE 0.9 % IV SOLN
1.0000 g | Freq: Once | INTRAVENOUS | Status: AC
  Administered 2021-01-10: 1 g via INTRAVENOUS
  Filled 2021-01-10: qty 1

## 2021-01-10 MED ORDER — ORAL CARE MOUTH RINSE: 15.0000 mL | Freq: Once | OROMUCOSAL | Status: AC

## 2021-01-10 MED ORDER — ONDANSETRON HCL 4 MG/2ML IJ SOLN: 4.0000 mg | Freq: Once | INTRAMUSCULAR | Status: DC | PRN

## 2021-01-10 MED ORDER — CIPROFLOXACIN IN D5W 400 MG/200ML IV SOLN
INTRAVENOUS | Status: AC
  Filled 2021-01-10: qty 200

## 2021-01-10 MED ORDER — CEFAZOLIN SODIUM-DEXTROSE 2-4 GM/100ML-% IV SOLN: 2.0000 g | INTRAVENOUS | Status: DC

## 2021-01-10 SURGICAL SUPPLY — 37 items
ADAPTER IRRIG TUBE 2 SPIKE SOL (ADAPTER) ×4 IMPLANT
ADPR TBG 2 SPK PMP STRL ASCP (ADAPTER) ×2
BAG DRN LRG CPC RND TRDRP CNTR (MISCELLANEOUS) ×1
BAG URO DRAIN 4000ML (MISCELLANEOUS) ×2 IMPLANT
CATH FOLEY 3WAY 30CC 24FR (CATHETERS) ×2
CATH URETL 5X70 OPEN END (CATHETERS) ×2 IMPLANT
CATH URTH STD 24FR FL 3W 2 (CATHETERS) ×1 IMPLANT
CONTAINER COLLECT MORCELLATR (MISCELLANEOUS) ×1 IMPLANT
DRAPE UTILITY 15X26 TOWEL STRL (DRAPES) IMPLANT
ELECT BIVAP BIPO 22/24 DONUT (ELECTROSURGICAL)
ELECTRD BIVAP BIPO 22/24 DONUT (ELECTROSURGICAL) IMPLANT
FIBER LASER FLEXIVA PULSE 550 (Laser) ×2 IMPLANT
FIBER LASER MOSES 550 DFL (Laser) IMPLANT
FILTER OVERFLOW MORCELLATOR (FILTER) ×1 IMPLANT
GLOVE SURG UNDER POLY LF SZ7.5 (GLOVE) ×2 IMPLANT
GOWN STRL REUS W/ TWL LRG LVL3 (GOWN DISPOSABLE) ×1 IMPLANT
GOWN STRL REUS W/ TWL XL LVL3 (GOWN DISPOSABLE) ×1 IMPLANT
GOWN STRL REUS W/TWL LRG LVL3 (GOWN DISPOSABLE) ×2
GOWN STRL REUS W/TWL XL LVL3 (GOWN DISPOSABLE) ×2
HOLDER FOLEY CATH W/STRAP (MISCELLANEOUS) ×2 IMPLANT
IV NS IRRIG 3000ML ARTHROMATIC (IV SOLUTION) ×20 IMPLANT
KIT TURNOVER CYSTO (KITS) ×2 IMPLANT
MANIFOLD NEPTUNE II (INSTRUMENTS) ×2 IMPLANT
MBRN O SEALING YLW 17 FOR INST (MISCELLANEOUS) ×2
MEMBRANE SLNG YLW 17 FOR INST (MISCELLANEOUS) ×1 IMPLANT
MORCELLATOR COLLECT CONTAINER (MISCELLANEOUS) ×2
MORCELLATOR OVERFLOW FILTER (FILTER) ×2
MORCELLATOR ROTATION 4.75 335 (MISCELLANEOUS) ×2 IMPLANT
PACK CYSTO AR (MISCELLANEOUS) ×2 IMPLANT
SET CYSTO W/LG BORE CLAMP LF (SET/KITS/TRAYS/PACK) ×2 IMPLANT
SET IRRIG Y TYPE TUR BLADDER L (SET/KITS/TRAYS/PACK) ×2 IMPLANT
SLEEVE PROTECTION STRL DISP (MISCELLANEOUS) ×4 IMPLANT
SURGILUBE 2OZ TUBE FLIPTOP (MISCELLANEOUS) ×2 IMPLANT
SYR TOOMEY IRRIG 70ML (MISCELLANEOUS) ×2
SYRINGE TOOMEY IRRIG 70ML (MISCELLANEOUS) ×1 IMPLANT
TUBE PUMP MORCELLATOR PIRANHA (TUBING) ×2 IMPLANT
WATER STERILE IRR 1000ML POUR (IV SOLUTION) ×2 IMPLANT

## 2021-01-10 NOTE — Transfer of Care (Signed)
Immediate Anesthesia Transfer of Care Note  Patient: Melvin Willis  Procedure(s) Performed: HOLEP-LASER ENUCLEATION OF THE PROSTATE WITH MORCELLATION (N/A )  Patient Location: PACU  Anesthesia Type:General  Level of Consciousness: awake, drowsy and patient cooperative  Airway & Oxygen Therapy: Patient Spontanous Breathing and Patient connected to face mask oxygen  Post-op Assessment: Report given to RN and Post -op Vital signs reviewed and stable  Post vital signs: Reviewed and stable  Last Vitals:  Vitals Value Taken Time  BP 118/73 01/10/21 0935  Temp    Pulse 87 01/10/21 0942  Resp 20 01/10/21 0942  SpO2 100 % 01/10/21 0942  Vitals shown include unvalidated device data.  Last Pain:  Vitals:   01/10/21 0628  TempSrc: Oral  PainSc: 0-No pain         Complications: No complications documented.

## 2021-01-10 NOTE — Discharge Instructions (Addendum)
Indwelling Urinary Catheter Care, Adult An indwelling urinary catheter is a thin tube that is put into your bladder. The tube helps to drain pee (urine) out of your body. The tube goes in through your urethra. Your urethra is where pee comes out of your body. Your pee will come out through the catheter, then it will go into a bag (drainage bag). Take good care of your catheter so it will work well. How to wear your catheter and bag Supplies needed  Sticky tape (adhesive tape) or a leg strap.  Alcohol wipe or soap and water (if you use tape).  A clean towel (if you use tape).  Large overnight bag.  Smaller bag (leg bag). Wearing your catheter Attach your catheter to your leg with tape or a leg strap.  Make sure the catheter is not pulled tight.  If a leg strap gets wet, take it off and put on a dry strap.  If you use tape to hold the bag on your leg: 1. Use an alcohol wipe or soap and water to wash your skin where the tape made it sticky before. 2. Use a clean towel to pat-dry that skin. 3. Use new tape to make the bag stay on your leg. Wearing your bags You should have been given a large overnight bag.  You may wear the overnight bag in the day or night.  Always have the overnight bag lower than your bladder.  Do not let the bag touch the floor.  Before you go to sleep, put a clean plastic bag in a wastebasket. Then hang the overnight bag inside the wastebasket. You should also have a smaller leg bag that fits under your clothes.  Always wear the leg bag below your knee.  Do not wear your leg bag at night. How to care for your skin and catheter Supplies needed  A clean washcloth.  Water and mild soap.  A clean towel. Caring for your skin and catheter  Clean the skin around your catheter every day: 1. Wash your hands with soap and water. 2. Wet a clean washcloth in warm water and mild soap. 3. Clean the skin around your urethra.  If you are male:  Gently  spread the folds of skin around your vagina (labia).  With the washcloth in your other hand, wipe the inner side of your labia on each side. Wipe from front to back.  If you are male:  Pull back any skin that covers the end of your penis (foreskin).  With the washcloth in your other hand, wipe your penis in small circles. Start wiping at the tip of your penis, then move away from the catheter.  Move the foreskin back in place, if needed. 4. With your free hand, hold the catheter close to where it goes into your body.  Keep holding the catheter during cleaning so it does not get pulled out. 5. With the washcloth in your other hand, clean the catheter.  Only wipe downward on the catheter.  Do not wipe upward toward your body. Doing this may push germs into your urethra and cause infection. 6. Use a clean towel to pat-dry the catheter and the skin around it. Make sure to wipe off all soap. 7. Wash your hands with soap and water.  Shower every day. Do not take baths.  Do not use cream, ointment, or lotion on the area where the catheter goes into your body, unless your doctor tells you to.  Do not  use powders, sprays, or lotions on your genital area.  Check your skin around the catheter every day for signs of infection. Check for: ? Redness, swelling, or pain. ? Fluid or blood. ? Warmth. ? Pus or a bad smell.      How to empty the bag Supplies needed  Rubbing alcohol.  Gauze pad or cotton ball.  Tape or a leg strap. Emptying the bag Pour the pee out of your bag when it is ?- full, or at least 2-3 times a day. Do this for your overnight bag and your leg bag. 1. Wash your hands with soap and water. 2. Separate (detach) the bag from your leg. 3. Hold the bag over the toilet or a clean pail. Keep the bag lower than your hips and bladder. This is so the pee (urine) does not go back into the tube. 4. Open the pour spout. It is at the bottom of the bag. 5. Empty the pee into the  toilet or pail. Do not let the pour spout touch any surface. 6. Put rubbing alcohol on a gauze pad or cotton ball. 7. Use the gauze pad or cotton ball to clean the pour spout. 8. Close the pour spout. 9. Attach the bag to your leg with tape or a leg strap. 10. Wash your hands with soap and water. Follow instructions for cleaning the drainage bag:  From the product maker.  As told by your doctor. How to change the bag Supplies needed  Alcohol wipes.  A clean bag.  Tape or a leg strap. Changing the bag Replace your bag when it starts to leak, smell bad, or look dirty. 1. Wash your hands with soap and water. 2. Separate the dirty bag from your leg. 3. Pinch the catheter with your fingers so that pee does not spill out. 4. Separate the catheter tube from the bag tube where these tubes connect (at the connection valve). Do not let the tubes touch any surface. 5. Clean the end of the catheter tube with an alcohol wipe. Use a different alcohol wipe to clean the end of the bag tube. 6. Connect the catheter tube to the tube of the clean bag. 7. Attach the clean bag to your leg with tape or a leg strap. Do not make the bag tight on your leg. 8. Wash your hands with soap and water. General rules  Never pull on your catheter. Never try to take it out. Doing that can hurt you.  Always wash your hands before and after you touch your catheter or bag. Use a mild, fragrance-free soap. If you do not have soap and water, use hand sanitizer.  Always make sure there are no twists or bends (kinks) in the catheter tube.  Always make sure there are no leaks in the catheter or bag.  Drink enough fluid to keep your pee pale yellow.  Do not take baths, swim, or use a hot tub.  If you are male, wipe from front to back after you poop (have a bowel movement).   Contact a doctor if:  Your pee is cloudy.  Your pee smells worse than usual.  Your catheter gets clogged.  Your catheter  leaks.  Your bladder feels full. Get help right away if:  You have redness, swelling, or pain where the catheter goes into your body.  You have fluid, blood, pus, or a bad smell coming from the area where the catheter goes into your body.  Your skin feels  warm where the catheter goes into your body.  You have a fever.  You have pain in your: ? Belly (abdomen). ? Legs. ? Lower back. ? Bladder.  You see blood in the catheter.  Your pee is pink or red.  You feel sick to your stomach (nauseous).  You throw up (vomit).  You have chills.  Your pee is not draining into the bag.  Your catheter gets pulled out. Summary  An indwelling urinary catheter is a thin tube that is placed into the bladder to help drain pee (urine) out of the body.  The catheter is placed into the part of the body that drains pee from the bladder (urethra).  Taking good care of your catheter will keep it working properly and help prevent problems.  Always wash your hands before and after touching your catheter or bag.  Never pull on your catheter or try to take it out. This information is not intended to replace advice given to you by your health care provider. Make sure you discuss any questions you have with your health care provider. Document Revised: 12/16/2018 Document Reviewed: 04/09/2017 Elsevier Patient Education  2021 [place]   1) The drugs that you were given will stay in your system until tomorrow so for the next 24 hours you should not:  A) Drive an automobile B) Make any legal decisions C) Drink any alcoholic beverage   2) You may resume regular meals tomorrow.  Today it is better to start with liquids and gradually work up to solid foods.  You may eat anything you prefer, but it is better to start with liquids, then soup and crackers, and gradually work up to solid foods.   3) Please notify your doctor immediately if you  have any unusual bleeding, trouble breathing, redness and pain at the surgery site, drainage, fever, or pain not relieved by medication.    4) Additional Instructions:        Please contact your physician with any problems or Same Day Surgery at [phone], Monday through Friday 6 am to 4 pm, or [place] at [facility] number at [phone].

## 2021-01-10 NOTE — Op Note (Signed)
Date of procedure: 01/10/21  Preoperative diagnosis:  1. BPH and urinary retention  Postoperative diagnosis:  1. Same  Procedure: 1. HoLEP (Holmium Laser Enucleation of the Prostate)  Surgeon: Nickolas Madrid, MD  Anesthesia: General  Complications: None  Intraoperative findings:  1.  Massive median and lateral lobes, ureteral orifices orthotopic bilaterally 2.  No suspicious bladder lesions, uncomplicated HOLEP, ureteral orifices and verumontanum intact at conclusion of case  EBL: 10 mL  Specimens: Prostate chips  Enucleation time: 43 minutes  Morcellation time: 19 minutes  Intra-op weight: 105 g  Drains: 24 French three-way, 80 cc in balloon  Indication: Melvin Willis is a 70 y.o. patient with a number of comorbidities who has developed Foley dependent urinary retention with multiple admissions for UTI/sepsis/AKI with hydronephrosis and opted for HOLEP.  He has a history of a negative prostate biopsy in the last year.  After reviewing the management options for treatment, they elected to proceed with the above surgical procedure(s). We have discussed the potential benefits and risks of the procedure, side effects of the proposed treatment, the likelihood of the patient achieving the goals of the procedure, and any potential problems that might occur during the procedure or recuperation.  We specifically discussed the risks of bleeding, infection, hematuria and clot retention, need for additional procedures, possible overnight hospital stay, temporary urgency and incontinence, rare long-term incontinence, and retrograde ejaculation.  Informed consent has been obtained.   Description of procedure:  The patient was taken to the operating room and general anesthesia was induced.  The patient was placed in the dorsal lithotomy position, prepped and draped in the usual sterile fashion, and preoperative antibiotics(ampicillin and Cipro) were administered.  SCDs were not placed in  setting of his recent bilateral DVTs and IVC filter placement.  A preoperative time-out was performed.   Leander Rams sounds were used to gently dilated the urethra up to 17F. The 38 French continuous flow resectoscope was inserted into the urethra using the visual obturator  The prostate was large with a massive median lobe and obstructing lateral lobes. The bladder was thoroughly inspected and notable for mild to moderate trabeculations but no suspicious lesions.  The ureteral orifices were located in orthotopic position.  The laser was set to 2 J and 50 Hz and was used to make a lambda incision just proximal to the verumontanum down to the level of the capsule.  A 5 and 7 o'clock incisions were then made down to the level of the capsule from the bladder neck to the verumontanum, and the median lobe was enucleated into the bladder.  The lateral lobes were then incised circumferentially until they were disconnected from the surrounding tissue.  The capsule was examined and laser was used for meticulous hemostasis.    The 25 French resectoscope was then switched out for the 44 French nephroscope and the lobes were morcellated and the tissue sent to pathology.  A 24 French three-way catheter was inserted easily, and 80 cc were placed in the balloon.  Urine was faint pink.  The catheter irrigated easily with a Toomey syringe.  CBI was initiated. A belladonna suppository was placed.  The patient tolerated the procedure well without any immediate complications and was extubated and transferred to the recovery room in stable condition.  Urine was clear on fast CBI.  Disposition: Stable to PACU  Plan: Wean CBI in PACU, anticipate discharge home today with void trial in clinic in 2-3 days  Nickolas Madrid, MD 01/10/2021

## 2021-01-10 NOTE — H&P (Signed)
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  He has been on maximal medical therapy with BPH and finasteride for his significantly enlarged prostate and urinary symptoms of weak stream.  He was recently hospitalized for sepsis from urinary source with Klebsiella, as well as retention requiring Foley catheter placement.  I personally viewed and interpreted CT from 11/14/2020 that shows a distended bladder and some mild hydroureteronephrosis down to the bladder bilaterally.  He saw our PA for voiding trial last week and was unable to void with a PVR of 900 mL.  After long discussion of the risks and benefits with his comorbidities, he has opted for a HOLEP to manage his Foley dependent urinary retention secondary to BPH.    PMH: Past Medical History:  Diagnosis Date  . Abnormal prostate specific antigen 08/08/2012  . Adiposity 04/16/2015  . Chronic kidney disease (CKD), stage III (moderate) ([place]) 11/27/2016  . CVA (cerebral vascular accident) ([place]) 06/17/2016  . Diabetes mellitus without complication ([place])   . Diverticulosis of sigmoid colon 04/16/2015  . Dyslipidemia 03/18/2015  . Hemorrhoids, internal 04/16/2015  . Hypercholesteremia 04/16/2015  . Hyperlipidemia   . Hypertension   . Primary cancer of left upper lobe of lung ([place])   . Pulmonary embolism ([place])   . Wears dentures    partial upper    Surgical History: Past Surgical History:  Procedure Laterality Date  . COLONOSCOPY    . COLONOSCOPY WITH PROPOFOL N/A 05/06/2015   Procedure: COLONOSCOPY WITH PROPOFOL;  Surgeon: Lucilla Lame, MD;  Location: [facility];  Service: Endoscopy;  Laterality: N/A;  ASCENDING COLON POLYPS X 2 TERMINAL ILEUM BIOPSY RANDOM  COLON BX. TRANSVERSE COLON POLYP SIGMOID COLON POLYP  . ESOPHAGOGASTRODUODENOSCOPY (EGD) WITH PROPOFOL N/A 05/06/2015   Procedure: ESOPHAGOGASTRODUODENOSCOPY (EGD) WITH PROPOFOL;  Surgeon: Lucilla Lame, MD;  Location: [facility];  Service: Endoscopy;  Laterality: N/A;  GASTRIC BIOPSY X1  . IVC FILTER INSERTION N/A 12/29/2020   Procedure: IVC FILTER INSERTION;  Surgeon: Dwana Curd, MD;  Location: [facility];  Service: Cardiovascular;  Laterality: N/A;  . KIDNEY STONE SURGERY  2017    Family History: Family History  Problem Relation Age of Onset  . Diabetes Mother   . Diabetes Father   . CAD Father   . Dementia Father   . Diabetes Sister   . Cancer Maternal Uncle        Prostate  . Cancer Cousin        prostate    Social History:  reports that he has never smoked. He has never used smokeless tobacco. He reports that he does not drink alcohol and does not use drugs.  Physical Exam: BP (!) 125/98   Pulse 93   Temp 98.1 F (36.7 C) (Oral)   Resp 18   Ht 5\' 4"  (1.626 m)   Wt 71.2 kg   SpO2 94%   BMI 26.95 kg/m    Constitutional:  Alert and oriented, No acute distress. Cardiovascular: Regular rate and rhythm Respiratory: Clear to auscultation bilaterally   Laboratory Data: Recent culture data reviewed   Assessment & Plan:   70 year old extremely comorbid male with Foley dependent urinary retention, 170 g prostate,  negative prostate biopsy, recent hospitalization for sepsis from urinary source with catheter in place, and recent DVT status post IVC filter.  We discussed the risks and benefits of HoLEP at length.  The procedure requires general anesthesia and takes 2 to 3 hours, and a holmium laser is used to enucleate the prostate and push this tissue into the bladder.  A morcellator is then used to remove this tissue, which is sent for pathology.  The vast majority of patients are able to discharge the same day with a catheter in place for 2 to 3  days, and will follow-up in clinic for a voiding trial.  Approximately 5% of patients will be admitted overnight to monitor the urine, or if they have multiple co-morbidities.  We specifically discussed the risks of bleeding, infection, retrograde ejaculation, temporary urgency and urge incontinence, very low risk of long-term incontinence, pathologic evaluation of prostate tissue and possible detection of prostate cancer or other malignancy, and possible need for additional procedures.  HOLEP today Ampicillin and Cipro for preoperative antibiotics  Nickolas Madrid, MD 01/10/2021  [place] [address] [place], [place] [address] [phone]

## 2021-01-10 NOTE — OR Nursing (Signed)
Pt arrived from pacu with CBI, instructed by MD to run for 10 min, then clamp.  Urine remains cranberry-red, called in to OR room and advised of same.  Per OR staff, per Dr. Glori Luis continue CBI - he will see patient when he finishes his case.

## 2021-01-10 NOTE — OR Nursing (Signed)
Dr. Diamantina Providence in to see pt in postop earlier and performed foley irrigation, advised to cap CBI when current bags empty, and ok to d/c pt to home.

## 2021-01-10 NOTE — Anesthesia Preprocedure Evaluation (Addendum)
Anesthesia Evaluation  Patient identified by MRN, date of birth, ID band Patient awake    Reviewed: Allergy & Precautions, NPO status , Patient's Chart, lab work & pertinent test results  History of Anesthesia Complications Negative for: history of anesthetic complications  Airway Mallampati: II  TM Distance: >3 FB Neck ROM: Full    Dental  (+) Partial Upper   Pulmonary neg sleep apnea, neg COPD, Patient abstained from smoking.Not current smoker,  Lung cancer    Pulmonary exam normal breath sounds clear to auscultation       Cardiovascular Exercise Tolerance: Good METShypertension, + DVT  (-) CAD and (-) Past MI (-) dysrhythmias  Rhythm:Regular Rate:Normal - Systolic murmurs IVC filter in place   Neuro/Psych Lung cancer with brain mets, s/p whole brain radiation CVA, No Residual Symptoms negative psych ROS   GI/Hepatic neg GERD  ,(+)     (-) substance abuse  ,   Endo/Other  diabetes  Renal/GU CRFRenal disease     Musculoskeletal   Abdominal   Peds  Hematology   Anesthesia Other Findings Past Medical History: 08/08/2012: Abnormal prostate specific antigen 04/16/2015: Adiposity 11/27/2016: Chronic kidney disease (CKD), stage III (moderate) ([place]) 06/17/2016: CVA (cerebral vascular accident) ([place]) No date: Diabetes mellitus without complication ([place]) 10/12/3662: Diverticulosis of sigmoid colon 03/18/2015: Dyslipidemia 04/16/2015: Hemorrhoids, internal 04/16/2015: Hypercholesteremia No date: Hyperlipidemia No date: Hypertension No date: Primary cancer of left upper lobe of lung ([place]) No date: Pulmonary embolism ([facility]) No date: Wears dentures     Comment:  partial upper  Reproductive/Obstetrics                            Anesthesia Physical Anesthesia Plan  ASA: III  Anesthesia Plan: General   Post-op Pain Management:    Induction: Intravenous  PONV Risk Score and Plan: 3 and  Ondansetron and Dexamethasone  Airway Management Planned: Oral ETT and LMA  Additional Equipment: None  Intra-op Plan:   Post-operative Plan: Extubation in OR  Informed Consent: I have reviewed the patients History and Physical, chart, labs and discussed the procedure including the risks, benefits and alternatives for the proposed anesthesia with the patient or authorized representative who has indicated his/her understanding and acceptance.     Dental advisory given  Plan Discussed with: CRNA and Surgeon  Anesthesia Plan Comments: (Discussed risks of anesthesia with patient, including PONV, sore throat, lip/dental damage. Rare risks discussed as well, such as cardiorespiratory and neurological sequelae. Patient understands.)       Anesthesia Quick Evaluation

## 2021-01-10 NOTE — Anesthesia Procedure Notes (Signed)
Procedure Name: Intubation Date/Time: 01/10/2021 7:39 AM Performed by: Lowry Bowl, CRNA Pre-anesthesia Checklist: Patient identified, Emergency Drugs available, Suction available and Patient being monitored Patient Re-evaluated:Patient Re-evaluated prior to induction Oxygen Delivery Method: Circle system utilized Preoxygenation: Pre-oxygenation with 100% oxygen Induction Type: IV induction Ventilation: Mask ventilation without difficulty Laryngoscope Size: 4 and McGraph Grade View: Grade I Tube type: Oral Tube size: 7.0 mm Number of attempts: 1 Airway Equipment and Method: Stylet and Video-laryngoscopy Placement Confirmation: ETT inserted through vocal cords under direct vision,  positive ETCO2 and breath sounds checked- equal and bilateral Secured at: 21 cm Tube secured with: Tape Dental Injury: Teeth and Oropharynx as per pre-operative assessment

## 2021-01-11 NOTE — Anesthesia Postprocedure Evaluation (Signed)
Anesthesia Post Note  Patient: Melvin Willis  Procedure(s) Performed: HOLEP-LASER ENUCLEATION OF THE PROSTATE WITH MORCELLATION (N/A )  Patient location during evaluation: PACU Anesthesia Type: General Level of consciousness: awake and alert and oriented Pain management: pain level controlled Vital Signs Assessment: post-procedure vital signs reviewed and stable Respiratory status: spontaneous breathing Cardiovascular status: blood pressure returned to baseline Anesthetic complications: no   No complications documented.   Last Vitals:  Vitals:   01/10/21 1321 01/10/21 1349  BP: (!) 86/54 96/64  Pulse: (!) 114 (!) 108  Resp: 16 16  Temp:    SpO2: 99% 99%    Last Pain:  Vitals:   01/10/21 1349  TempSrc:   PainSc: 0-No pain                 Jaanai Salemi

## 2021-01-12 ENCOUNTER — Encounter: Payer: Self-pay | Admitting: Urology

## 2021-01-13 LAB — SURGICAL PATHOLOGY

## 2021-01-14 ENCOUNTER — Ambulatory Visit: Payer: Medicare Other | Admitting: Physician Assistant

## 2021-01-14 ENCOUNTER — Inpatient Hospital Stay: Payer: Medicare Other | Attending: Internal Medicine

## 2021-01-14 ENCOUNTER — Inpatient Hospital Stay (HOSPITAL_BASED_OUTPATIENT_CLINIC_OR_DEPARTMENT_OTHER): Payer: Medicare Other | Admitting: Internal Medicine

## 2021-01-14 ENCOUNTER — Ambulatory Visit (INDEPENDENT_AMBULATORY_CARE_PROVIDER_SITE_OTHER): Payer: Medicare Other | Admitting: Physician Assistant

## 2021-01-14 ENCOUNTER — Encounter: Payer: Self-pay | Admitting: Internal Medicine

## 2021-01-14 ENCOUNTER — Other Ambulatory Visit: Payer: Self-pay

## 2021-01-14 DIAGNOSIS — N400 Enlarged prostate without lower urinary tract symptoms: Secondary | ICD-10-CM | POA: Insufficient documentation

## 2021-01-14 DIAGNOSIS — Z8719 Personal history of other diseases of the digestive system: Secondary | ICD-10-CM | POA: Insufficient documentation

## 2021-01-14 DIAGNOSIS — Z8249 Family history of ischemic heart disease and other diseases of the circulatory system: Secondary | ICD-10-CM | POA: Diagnosis not present

## 2021-01-14 DIAGNOSIS — Z833 Family history of diabetes mellitus: Secondary | ICD-10-CM | POA: Diagnosis not present

## 2021-01-14 DIAGNOSIS — C3412 Malignant neoplasm of upper lobe, left bronchus or lung: Secondary | ICD-10-CM | POA: Diagnosis not present

## 2021-01-14 DIAGNOSIS — C7931 Secondary malignant neoplasm of brain: Secondary | ICD-10-CM | POA: Diagnosis not present

## 2021-01-14 DIAGNOSIS — Z923 Personal history of irradiation: Secondary | ICD-10-CM | POA: Diagnosis not present

## 2021-01-14 DIAGNOSIS — I2699 Other pulmonary embolism without acute cor pulmonale: Secondary | ICD-10-CM | POA: Insufficient documentation

## 2021-01-14 DIAGNOSIS — Z8042 Family history of malignant neoplasm of prostate: Secondary | ICD-10-CM | POA: Diagnosis not present

## 2021-01-14 DIAGNOSIS — R413 Other amnesia: Secondary | ICD-10-CM | POA: Diagnosis not present

## 2021-01-14 DIAGNOSIS — R319 Hematuria, unspecified: Secondary | ICD-10-CM | POA: Insufficient documentation

## 2021-01-14 DIAGNOSIS — Z79899 Other long term (current) drug therapy: Secondary | ICD-10-CM | POA: Diagnosis not present

## 2021-01-14 DIAGNOSIS — E1122 Type 2 diabetes mellitus with diabetic chronic kidney disease: Secondary | ICD-10-CM | POA: Diagnosis not present

## 2021-01-14 DIAGNOSIS — N138 Other obstructive and reflux uropathy: Secondary | ICD-10-CM

## 2021-01-14 DIAGNOSIS — N1831 Chronic kidney disease, stage 3a: Secondary | ICD-10-CM | POA: Insufficient documentation

## 2021-01-14 DIAGNOSIS — R531 Weakness: Secondary | ICD-10-CM | POA: Diagnosis not present

## 2021-01-14 DIAGNOSIS — N401 Enlarged prostate with lower urinary tract symptoms: Secondary | ICD-10-CM

## 2021-01-14 DIAGNOSIS — R5383 Other fatigue: Secondary | ICD-10-CM | POA: Insufficient documentation

## 2021-01-14 DIAGNOSIS — M2550 Pain in unspecified joint: Secondary | ICD-10-CM | POA: Insufficient documentation

## 2021-01-14 DIAGNOSIS — Z7901 Long term (current) use of anticoagulants: Secondary | ICD-10-CM | POA: Diagnosis not present

## 2021-01-14 DIAGNOSIS — Z818 Family history of other mental and behavioral disorders: Secondary | ICD-10-CM | POA: Insufficient documentation

## 2021-01-14 LAB — COMPREHENSIVE METABOLIC PANEL
ALT: 40 U/L (ref 0–44)
AST: 34 U/L (ref 15–41)
Albumin: 3.4 g/dL — ABNORMAL LOW (ref 3.5–5.0)
Alkaline Phosphatase: 69 U/L (ref 38–126)
Anion gap: 11 (ref 5–15)
BUN: 9 mg/dL (ref 8–23)
CO2: 22 mmol/L (ref 22–32)
Calcium: 8.9 mg/dL (ref 8.9–10.3)
Chloride: 102 mmol/L (ref 98–111)
Creatinine, Ser: 1.11 mg/dL (ref 0.61–1.24)
GFR, Estimated: >60 mL/min (ref >60)
Glucose, Bld: 190 mg/dL — ABNORMAL HIGH (ref 70–99)
Potassium: 3.6 mmol/L (ref 3.5–5.1)
Sodium: 135 mmol/L (ref 135–145)
Total Bilirubin: 0.7 mg/dL (ref 0.3–1.2)
Total Protein: 6.4 g/dL — ABNORMAL LOW (ref 6.5–8.1)

## 2021-01-14 LAB — CBC WITH DIFFERENTIAL/PLATELET
Abs Immature Granulocytes: 0.04 10*3/uL (ref 0.00–0.07)
Basophils Absolute: 0 10*3/uL (ref 0.0–0.1)
Basophils Relative: 1 %
Eosinophils Absolute: 0.1 10*3/uL (ref 0.0–0.5)
Eosinophils Relative: 2 %
HCT: 32 % — ABNORMAL LOW (ref 39.0–52.0)
Hemoglobin: 10.9 g/dL — ABNORMAL LOW (ref 13.0–17.0)
Immature Granulocytes: 1 %
Lymphocytes Relative: 26 %
Lymphs Abs: 1.5 10*3/uL (ref 0.7–4.0)
MCH: 30.1 pg (ref 26.0–34.0)
MCHC: 34.1 g/dL (ref 30.0–36.0)
MCV: 88.4 fL (ref 80.0–100.0)
Monocytes Absolute: 0.6 10*3/uL (ref 0.1–1.0)
Monocytes Relative: 11 %
Neutro Abs: 3.6 10*3/uL (ref 1.7–7.7)
Neutrophils Relative %: 59 %
Platelets: 230 10*3/uL (ref 150–400)
RBC: 3.62 MIL/uL — ABNORMAL LOW (ref 4.22–5.81)
RDW: 14.6 % (ref 11.5–15.5)
WBC: 5.9 10*3/uL (ref 4.0–10.5)
nRBC: 0 % (ref 0.0–0.2)

## 2021-01-14 LAB — LIPID PANEL
Cholesterol: 153 mg/dL (ref 0–200)
HDL: 49 mg/dL (ref >40)
LDL Cholesterol: 79 mg/dL (ref 0–99)
Total CHOL/HDL Ratio: 3.1 RATIO
Triglycerides: 124 mg/dL (ref <150)
VLDL: 25 mg/dL (ref 0–40)

## 2021-01-14 LAB — BLADDER SCAN AMB NON-IMAGING: Scan Result: 13 mL

## 2021-01-14 NOTE — Progress Notes (Signed)
Afternoon follow-up  Patient returned to clinic this afternoon for repeat PVR. He reports drinking approximately 24oz of fluid. He has been able to urinate. He has had a large volume of urinary leakage. PVR 13mL.  Results for orders placed or performed in visit on 01/14/21  Bladder Scan (Post Void Residual) in office  Result Value Ref Range   Scan Result 13 mL    Voiding trial passed. Counseled patient on normal postoperative findings including dysuria, gross hematuria, and urinary leakage. Counseled him to start Kegel exercises 3x10 sets daily to increase control. Shared negative pathology results.  Follow up: Postop follow-up with Dr. Diamantina Providence as scheduled.

## 2021-01-14 NOTE — Progress Notes (Signed)
Catheter Removal  Patient is present today for a catheter removal.  93ml of water was drained from the balloon. A 24FR foley cath was removed from the bladder no complications were noted . Patient tolerated well.  Performed by: Bradly Bienenstock, CMA  Follow up/ Additional notes: RTC this afternoon for PVR.

## 2021-01-14 NOTE — Assessment & Plan Note (Addendum)
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   # Continue to HOLD LORLATINIB 100 mg/day; given acute issues; esp with improved MRI brain/and declining PS. Will re-start when PS improves.     #Brain metastases - [facility] 2022]progressive disease in the brain-status post whole brain radiation; MRI brain- May 24th- improved. .  # Poorly controlled Blood glucose [also likely secondary to lorlatinib]/190 PBF;continue continue Lantus 15 units at night.   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 DM;continue  gabapentin 200 mg qhs-  # Prostatism BPH [s/p biopsy]-s/p  HELOP [5/06]; ok to re-start eliquis 2.5 mg BID on 5/13  # DISPOSITION:  # follow up in 4 weeks-MD; labs cbc/cmp-Dr.B  Cc; Dr.Sowles.

## 2021-01-14 NOTE — Addendum Note (Signed)
Addended by: Evelina Bucy on: 01/14/2021 03:40 PM   Modules accepted: Orders

## 2021-01-14 NOTE — Patient Instructions (Signed)
ok to re-start eliquis 2.5 mg BID on 5/13; let us/Dr.Sininski if any blood in urine

## 2021-01-14 NOTE — Progress Notes (Signed)
[place] OFFICE PROGRESS NOTE  Patient Care Team: [name], [name], [name], [name], [facility] (Pharmacist)  Cancer Staging No matching staging information was found for the patient.   Oncology History Overview Note  # OCT 2017- ADENO CA LUNG; STAGE IV [; LUL; bil supraclavicular LN; Left neck LN Bx]; ROS-1 MUTATED; s/p Carbo-alimta x1[oct 2017]  # NOV 1st 2017- XALKORI 250 mg BID; JAN 15th CT- PR;  # AUG 27th Chemo-RT to persistent LUL/mediastinal LN [s/p carbo-taxol- with RT; finished Sep 22nd 2018];   # July 14th 2020- multiple metastatic lesions of the brain [July 30 whole brain radiation finished April 21, 2019]; bone scan skull metastases /posterior left ninth rib metastases; April 04, 2019 stopped crizotinib;  #April 25, 2019-start Entrectinib 600 mg once a day; STopped in NOV 2020 [dizziness]; NOV 2020-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  In the interim patient also underwent-HOLEP on May 6.   # LLE DVT/bil PE/Multiple strokes [? 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  # MRI brain- multiple infarcts [2d echo/bubble study-NEG's/p Neurology  eval]  ------------------------------------------------------------    # # s/p TURP [Sep 2017; Dr.Cope] DEC 26th CT- distended bladder;  # Elevated PSA-  FEB 2021- 12;  S/p Bx- Negative; follow with urology/Dr.Stoioff   # MOLECULAR TESTING- ROS-1 POSITIVE; ALK/EGFR-NEG; PDL-1 EXPRESSION- 90%** [HIGH]  # DEC 15th 2020- PALLIATIVE CARE -----------------------------------------------------------------    DIAGNOSIS: 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        INTERVAL HISTORY:  Melvin Willis 70 y.o.  male pleasant patient above history of metastatic lung cancer-to brain Ros-1 positive-most recently progressed in the brain s/p whole brain radiation-currently on lorlatinib is here for follow-up.  Patient was admitted recently to the hospital for UTI; noted to have bilateral peroneal DVT.  Given history of hemorrhagic brain metastases-patient received IVC filter placement.  His Eliquis was held.  May 24 inpatient MRI brain showed-improved/stable brain mets.  In the interim patient also underwent-HOLEP on May 6.  Patient's Eliquis is on hold since then.  Patient's Foley catheter was taken off this morning.  Patient notes of intermittent blood in urine.  However not consistent.   Complains of generalized weakness.  Complains of fatigue.  Currently off steroids.  Review of Systems  Constitutional: Positive for  malaise/fatigue. Negative for chills, diaphoresis, fever and weight loss.  HENT: Negative for nosebleeds and sore throat.   Eyes: Negative for double vision.  Respiratory: Negative for cough, hemoptysis, sputum production, shortness of breath and wheezing.   Cardiovascular: Negative for chest pain (Left upper chest wall pain chronic), palpitations and orthopnea.  Gastrointestinal: Negative for abdominal pain, blood in stool, constipation, diarrhea, heartburn and melena.  Genitourinary: Negative for dysuria.  Musculoskeletal: Positive for joint pain. Negative for back pain.  Skin: Negative.  Negative for itching and rash.  Neurological: Positive for tingling. Negative for focal weakness and weakness.  Endo/Heme/Allergies: Does not bruise/bleed easily.  Psychiatric/Behavioral: Positive for memory loss. Negative for depression. The patient is not nervous/anxious and does not have insomnia.     PAST MEDICAL HISTORY :  Past Medical History:  Diagnosis Date  . Abnormal prostate specific antigen 08/08/2012  . Adiposity 04/16/2015  . Chronic kidney disease (CKD), stage III (moderate) ([place]) 11/27/2016  . CVA (cerebral vascular accident) ([place]) 06/17/2016  . Diabetes mellitus without complication ([place])   . Diverticulosis of sigmoid colon 04/16/2015  . Dyslipidemia 03/18/2015  . Hemorrhoids, internal 04/16/2015  . Hypercholesteremia 04/16/2015  . Hyperlipidemia   . Hypertension   . Primary cancer of left upper lobe of lung ([place])   . Pulmonary embolism ([place])   . Wears dentures    partial upper    PAST SURGICAL HISTORY :   Past Surgical History:  Procedure Laterality Date  . COLONOSCOPY    . COLONOSCOPY WITH PROPOFOL N/A 05/06/2015   Procedure: COLONOSCOPY WITH PROPOFOL;  Surgeon: Lucilla Lame, MD;  Location: [place];  Service: Endoscopy;  Laterality: N/A;  ASCENDING COLON POLYPS X 2 TERMINAL ILEUM BIOPSY RANDOM COLON BX. TRANSVERSE COLON POLYP SIGMOID COLON POLYP  .  ESOPHAGOGASTRODUODENOSCOPY (EGD) WITH PROPOFOL N/A 05/06/2015   Procedure: ESOPHAGOGASTRODUODENOSCOPY (EGD) WITH PROPOFOL;  Surgeon: Lucilla Lame, MD;  Location: [place];  Service: Endoscopy;  Laterality: N/A;  GASTRIC BIOPSY X1  . HOLEP-LASER ENUCLEATION OF THE PROSTATE WITH MORCELLATION N/A 01/10/2021   Procedure: HOLEP-LASER ENUCLEATION OF THE PROSTATE WITH MORCELLATION;  Surgeon: Billey Co, MD;  Location: [facility];  Service: Urology;  Laterality: N/A;  . IVC FILTER INSERTION N/A 12/29/2020   Procedure: IVC FILTER INSERTION;  Surgeon: Dwana Curd, MD;  Location: [place] [facility];  Service: Cardiovascular;  Laterality: N/A;  . KIDNEY STONE SURGERY  2017    FAMILY HISTORY :   Family History  Problem Relation Age of Onset  . Diabetes Mother   . Diabetes Father   . CAD Father   . Dementia Father   . Diabetes Sister   . Cancer Maternal Uncle        Prostate  . Cancer Cousin        prostate    SOCIAL HISTORY:   Social History   Tobacco Use  . Smoking status: Never Smoker  . Smokeless tobacco: Never Used  Vaping Use  . Vaping Use: Never used  Substance Use Topics  . Alcohol use: No    Alcohol/week: 0.0 standard drinks  . Drug use: No    ALLERGIES:  has No Known Allergies.  MEDICATIONS:  Current Outpatient Medications  Medication Sig Dispense Refill  . apixaban (ELIQUIS) 2.5 MG TABS tablet Take 1 tablet (2.5 mg total) by mouth 2 (two) times daily. 60 tablet 1  . atorvastatin (LIPITOR) 40 MG tablet Take 1 tablet (40 mg total) by  mouth daily. 90 tablet 0  . blood glucose meter kit and supplies Dispense based on patient and insurance preference (Accuchek Aviva Plus, Accuchek Nano). Use up to four times daily as directed. (FOR ICD-10 E10.9, E11.9). 1 each 0  . gabapentin (NEURONTIN) 300 MG capsule Take 1 capsule by mouth at bedtime (Patient taking differently: Take 300 mg by mouth at bedtime.) 90 capsule 1  . glipiZIDE (GLUCOTROL) 5 MG tablet Take 1  tablet (5 mg total) by mouth 2 (two) times daily before a meal. 60 tablet 3  . Insulin Glargine-Lixisenatide 100-33 UNT-MCG/ML SOPN Inject 15 Units into the skin daily with lunch.    . Insulin Pen Needle (NOVOFINE PEN NEEDLE) 32G X 6 MM MISC 1 each by Does not apply route daily. 100 each 2  . levETIRAcetam (KEPPRA) 500 MG tablet Take 1 tablet (500 mg total) by mouth 2 (two) times daily. 60 tablet 0  . [START ON 01/15/2021] lorlatinib (LORBRENA) 100 MG tablet Take 1 tablet (100 mg total) by mouth daily. Swallow tablets whole. Do not chew, crush or split tablets. 30 tablet 6  . HYDROcodone-acetaminophen (NORCO/VICODIN) 5-325 MG tablet Take 1 tablet by mouth every 6 (six) hours as needed for up to 5 days for severe pain. (Patient not taking: Reported on 01/14/2021) 10 tablet 0   No current facility-administered medications for this visit.    PHYSICAL EXAMINATION: ECOG PERFORMANCE STATUS: 0 - Asymptomatic  BP 109/83 (BP Location: Left Arm, Patient Position: Sitting, Cuff Size: Normal)   Pulse 100   Temp 98.3 F (36.8 C) (Tympanic)   Resp 16   Ht _0  (1.626 m)   Wt 162 lb (73.5 kg)   SpO2 99%   BMI 27.81 kg/m   Filed Weights   01/14/21 1330  Weight: 162 lb (73.5 kg)    Physical Exam Constitutional:      Comments: Patient is accompanied by his wife.  He is in a wheelchair.  HENT:     Head: Normocephalic and atraumatic.     Mouth/Throat:     Pharynx: No oropharyngeal exudate.  Eyes:     Pupils: Pupils are equal, round, and reactive to light.  Cardiovascular:     Rate and Rhythm: Normal rate and regular rhythm.  Pulmonary:     Effort: No respiratory distress.     Breath sounds: No wheezing.     Comments: Decreased breath sounds bilaterally no wheeze or crackles. Abdominal:     General: Bowel sounds are normal. There is no distension.     Palpations: Abdomen is soft. There is no mass.     Tenderness: There is no abdominal tenderness. There is no guarding or rebound.   Musculoskeletal:        General: No tenderness. Normal range of motion.     Cervical back: Normal range of motion and neck supple.  Skin:    General: Skin is warm.  Neurological:     Mental Status: He is alert and oriented to person, place, and time.  Psychiatric:        Mood and Affect: Affect normal.    LABORATORY DATA:  I have reviewed the data as listed    Component Value Date/Time   NA 135 01/14/2021 1314   NA 139 01/01/2016 1000   K 3.6 01/14/2021 1314   CL 102 01/14/2021 1314   CO2 22 01/14/2021 1314   GLUCOSE 190 (H) 01/14/2021 1314   BUN 9 01/14/2021 1314   BUN 13 01/01/2016 1000  CREATININE 1.11 01/14/2021 1314   CREATININE 1.20 01/09/2021 1226   CALCIUM 8.9 01/14/2021 1314   PROT 6.4 (L) 01/14/2021 1314   PROT 7.2 01/01/2016 1000   ALBUMIN 3.4 (L) 01/14/2021 1314   ALBUMIN 4.4 01/01/2016 1000   AST 34 01/14/2021 1314   ALT 40 01/14/2021 1314   ALKPHOS 69 01/14/2021 1314   BILITOT 0.7 01/14/2021 1314   BILITOT 0.4 01/01/2016 1000   GFRNONAA >60 01/14/2021 1314   GFRNONAA 62 01/09/2021 1226   GFRAA 72 01/09/2021 1226    No results found for: SPEP, UPEP  Lab Results  Component Value Date   WBC 5.9 01/14/2021   NEUTROABS 3.6 01/14/2021   HGB 10.9 (L) 01/14/2021   HCT 32.0 (L) 01/14/2021   MCV 88.4 01/14/2021   PLT 230 01/14/2021      Chemistry      Component Value Date/Time   NA 135 01/14/2021 1314   NA 139 01/01/2016 1000   K 3.6 01/14/2021 1314   CL 102 01/14/2021 1314   CO2 22 01/14/2021 1314   BUN 9 01/14/2021 1314   BUN 13 01/01/2016 1000   CREATININE 1.11 01/14/2021 1314   CREATININE 1.20 01/09/2021 1226      Component Value Date/Time   CALCIUM 8.9 01/14/2021 1314   ALKPHOS 69 01/14/2021 1314   AST 34 01/14/2021 1314   ALT 40 01/14/2021 1314   BILITOT 0.7 01/14/2021 1314   BILITOT 0.4 01/01/2016 1000       RADIOGRAPHIC STUDIES: I have personally reviewed the radiological images as listed and agreed with the findings in the  report. No results found.   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   # Continue to HOLD LORLATINIB 100 mg/day; given acute issues; esp with improved MRI brain/and declining PS. Will re-start when PS improves.     #Brain metastases - [facility] 2022]progressive disease in the brain-status post whole brain radiation; MRI brain- May 24th- improved. .  # Poorly controlled Blood glucose [also likely secondary to lorlatinib]/190 PBF;continue continue Lantus 15 units at night.   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 DM;continue  gabapentin 200 mg qhs-  # Prostatism BPH [s/p biopsy]-s/p  HELOP [5/06]; ok to re-start eliquis 2.5 mg BID on 5/13  # DISPOSITION:  # follow up in 4 weeks-MD; labs cbc/cmp-Dr.B  Cc; Dr.Sowles.    No orders of the defined types were placed in this encounter.  All questions were answered. The patient knows to call the clinic with any problems, questions or concerns.      Cammie Sickle, MD 01/14/2021 8:43 PM

## 2021-01-14 NOTE — Patient Instructions (Addendum)
Congratulations on your recent HOLEP procedure! As discussed in clinic today, there are three main side effects that commonly occur after surgery: 1. Burning or pain with urination: This typically resolves within 1 week of surgery. If you are still having significant pain with urination 10 days after surgery, please call our clinic. We may need to check you for a urinary tract infection at that point, though this is rare. 2. Blood in the urine: This may come and go, but typically resolves completely within 3 weeks of surgery. If you are on blood thinners, it may take longer for the bleeding to resolve. As long as your urine remains thin and runny and you are not passing large clots (around the size of your palm), this is a normal postoperative finding. If you start to pass dark red urine or thick, ketchup-like urine, please call our office immediately. 3. Urinary leakage or urgency: This tends to improve with time, with most patients becoming dry within around 3 months of surgery. You may wear absorbant underwear or liners for security during this time. To help you get dry faster, please make sure you are completing your Kegel exercises as instructed, with a set of 10 exercises completed up to three times daily.   Kegel Exercises  Kegel exercises can help strengthen your pelvic floor muscles. The pelvic floor is a group of muscles that support your rectum, small intestine, and bladder. In females, pelvic floor muscles also help support the womb (uterus). These muscles help you control the flow of urine and stool. Kegel exercises are painless and simple, and they do not require any equipment. Your provider may suggest Kegel exercises to:  Improve bladder and bowel control.  Improve sexual response.  Improve weak pelvic floor muscles after surgery to remove the uterus (hysterectomy) or pregnancy (females).  Improve weak pelvic floor muscles after prostate gland removal or surgery (males). Kegel  exercises involve squeezing your pelvic floor muscles, which are the same muscles you squeeze when you try to stop the flow of urine or keep from passing gas. The exercises can be done while sitting, standing, or lying down, but it is best to vary your position. Exercises How to do Kegel exercises: 1. Squeeze your pelvic floor muscles tight. You should feel a tight lift in your rectal area. If you are a male, you should also feel a tightness in your vaginal area. Keep your stomach, buttocks, and legs relaxed. 2. Hold the muscles tight for up to 10 seconds. 3. Breathe normally. 4. Relax your muscles. 5. Repeat as told by your health care provider. Repeat this exercise daily as told by your health care provider. Continue to do this exercise for at least 4-6 weeks, or for as long as told by your health care provider. You may be referred to a physical therapist who can help you learn more about how to do Kegel exercises. Depending on your condition, your health care provider may recommend:  Varying how long you squeeze your muscles.  Doing several sets of exercises every day.  Doing exercises for several weeks.  Making Kegel exercises a part of your regular exercise routine. This information is not intended to replace advice given to you by your health care provider. Make sure you discuss any questions you have with your health care provider. Document Revised: 12/29/2019 Document Reviewed: 04/13/2018 Elsevier Patient Education  [place].

## 2021-01-14 NOTE — Addendum Note (Signed)
Addended by: Mickle Plumb on: 01/14/2021 03:54 PM   Modules accepted: Level of Service

## 2021-01-15 ENCOUNTER — Other Ambulatory Visit: Payer: Self-pay | Admitting: Internal Medicine

## 2021-01-15 ENCOUNTER — Telehealth: Payer: Self-pay

## 2021-01-15 DIAGNOSIS — R338 Other retention of urine: Secondary | ICD-10-CM | POA: Diagnosis not present

## 2021-01-15 DIAGNOSIS — C3412 Malignant neoplasm of upper lobe, left bronchus or lung: Secondary | ICD-10-CM | POA: Diagnosis not present

## 2021-01-15 DIAGNOSIS — N390 Urinary tract infection, site not specified: Secondary | ICD-10-CM | POA: Diagnosis not present

## 2021-01-15 DIAGNOSIS — B965 Pseudomonas (aeruginosa) (mallei) (pseudomallei) as the cause of diseases classified elsewhere: Secondary | ICD-10-CM | POA: Diagnosis not present

## 2021-01-15 DIAGNOSIS — Z1621 Resistance to vancomycin: Secondary | ICD-10-CM | POA: Diagnosis not present

## 2021-01-15 DIAGNOSIS — N401 Enlarged prostate with lower urinary tract symptoms: Secondary | ICD-10-CM | POA: Diagnosis not present

## 2021-01-15 NOTE — Telephone Encounter (Signed)
Orders faxed

## 2021-01-15 NOTE — Telephone Encounter (Signed)
mychart notification sent 

## 2021-01-15 NOTE — Telephone Encounter (Signed)
-----   Message from Billey Co, MD sent at 01/15/2021 10:38 AM EDT ----- No prostate cancer seen on HOLEP tissue, keep follow-up as scheduled  Nickolas Madrid, MD 01/15/2021

## 2021-01-17 ENCOUNTER — Other Ambulatory Visit: Payer: Self-pay | Admitting: Internal Medicine

## 2021-01-17 ENCOUNTER — Telehealth: Payer: Self-pay

## 2021-01-17 DIAGNOSIS — R338 Other retention of urine: Secondary | ICD-10-CM | POA: Diagnosis not present

## 2021-01-17 DIAGNOSIS — N390 Urinary tract infection, site not specified: Secondary | ICD-10-CM | POA: Diagnosis not present

## 2021-01-17 DIAGNOSIS — Z1621 Resistance to vancomycin: Secondary | ICD-10-CM | POA: Diagnosis not present

## 2021-01-17 DIAGNOSIS — B965 Pseudomonas (aeruginosa) (mallei) (pseudomallei) as the cause of diseases classified elsewhere: Secondary | ICD-10-CM | POA: Diagnosis not present

## 2021-01-17 DIAGNOSIS — N401 Enlarged prostate with lower urinary tract symptoms: Secondary | ICD-10-CM | POA: Diagnosis not present

## 2021-01-17 DIAGNOSIS — C3412 Malignant neoplasm of upper lobe, left bronchus or lung: Secondary | ICD-10-CM | POA: Diagnosis not present

## 2021-01-17 NOTE — Telephone Encounter (Signed)
Copied from [place] [phone]. Topic: Quick Communication - Home Health Verbal Orders >> Jan 02, 2021 11:23 AM Pawlus, Brayton Layman A wrote: Caller/Agency: [facility] Callback Number: [phone] Requesting: Skilled Nursing Frequency: 1x5 >> Jan 16, 2021  1:02 PM Pawlus, Brayton Layman A wrote: [facility]- pt declined OT evaluation

## 2021-01-19 ENCOUNTER — Other Ambulatory Visit: Payer: Self-pay | Admitting: Internal Medicine

## 2021-01-21 DIAGNOSIS — R338 Other retention of urine: Secondary | ICD-10-CM | POA: Diagnosis not present

## 2021-01-21 DIAGNOSIS — Z1621 Resistance to vancomycin: Secondary | ICD-10-CM | POA: Diagnosis not present

## 2021-01-21 DIAGNOSIS — N390 Urinary tract infection, site not specified: Secondary | ICD-10-CM | POA: Diagnosis not present

## 2021-01-21 DIAGNOSIS — C3412 Malignant neoplasm of upper lobe, left bronchus or lung: Secondary | ICD-10-CM | POA: Diagnosis not present

## 2021-01-21 DIAGNOSIS — B965 Pseudomonas (aeruginosa) (mallei) (pseudomallei) as the cause of diseases classified elsewhere: Secondary | ICD-10-CM | POA: Diagnosis not present

## 2021-01-21 DIAGNOSIS — N401 Enlarged prostate with lower urinary tract symptoms: Secondary | ICD-10-CM | POA: Diagnosis not present

## 2021-01-22 DIAGNOSIS — B965 Pseudomonas (aeruginosa) (mallei) (pseudomallei) as the cause of diseases classified elsewhere: Secondary | ICD-10-CM | POA: Diagnosis not present

## 2021-01-22 DIAGNOSIS — R338 Other retention of urine: Secondary | ICD-10-CM | POA: Diagnosis not present

## 2021-01-22 DIAGNOSIS — N390 Urinary tract infection, site not specified: Secondary | ICD-10-CM | POA: Diagnosis not present

## 2021-01-22 DIAGNOSIS — N401 Enlarged prostate with lower urinary tract symptoms: Secondary | ICD-10-CM | POA: Diagnosis not present

## 2021-01-22 DIAGNOSIS — C3412 Malignant neoplasm of upper lobe, left bronchus or lung: Secondary | ICD-10-CM | POA: Diagnosis not present

## 2021-01-22 DIAGNOSIS — Z1621 Resistance to vancomycin: Secondary | ICD-10-CM | POA: Diagnosis not present

## 2021-01-23 DIAGNOSIS — C3412 Malignant neoplasm of upper lobe, left bronchus or lung: Secondary | ICD-10-CM | POA: Diagnosis not present

## 2021-01-23 DIAGNOSIS — B965 Pseudomonas (aeruginosa) (mallei) (pseudomallei) as the cause of diseases classified elsewhere: Secondary | ICD-10-CM | POA: Diagnosis not present

## 2021-01-23 DIAGNOSIS — R338 Other retention of urine: Secondary | ICD-10-CM | POA: Diagnosis not present

## 2021-01-23 DIAGNOSIS — Z1621 Resistance to vancomycin: Secondary | ICD-10-CM | POA: Diagnosis not present

## 2021-01-23 DIAGNOSIS — N401 Enlarged prostate with lower urinary tract symptoms: Secondary | ICD-10-CM | POA: Diagnosis not present

## 2021-01-23 DIAGNOSIS — N390 Urinary tract infection, site not specified: Secondary | ICD-10-CM | POA: Diagnosis not present

## 2021-01-27 DIAGNOSIS — B965 Pseudomonas (aeruginosa) (mallei) (pseudomallei) as the cause of diseases classified elsewhere: Secondary | ICD-10-CM | POA: Diagnosis not present

## 2021-01-27 DIAGNOSIS — N401 Enlarged prostate with lower urinary tract symptoms: Secondary | ICD-10-CM | POA: Diagnosis not present

## 2021-01-27 DIAGNOSIS — Z1621 Resistance to vancomycin: Secondary | ICD-10-CM | POA: Diagnosis not present

## 2021-01-27 DIAGNOSIS — C3412 Malignant neoplasm of upper lobe, left bronchus or lung: Secondary | ICD-10-CM | POA: Diagnosis not present

## 2021-01-27 DIAGNOSIS — N390 Urinary tract infection, site not specified: Secondary | ICD-10-CM | POA: Diagnosis not present

## 2021-01-27 DIAGNOSIS — R338 Other retention of urine: Secondary | ICD-10-CM | POA: Diagnosis not present

## 2021-01-28 ENCOUNTER — Telehealth: Payer: Self-pay

## 2021-01-28 NOTE — Progress Notes (Signed)
Unable to leave message due to mailbox is full to confirmed patient telephone appointment on 01/29/2021 for CCM at 11:00 am with [name] the Clinical pharmacist.   [place] Pharmacist Assistant [phone]

## 2021-01-29 ENCOUNTER — Telehealth: Payer: Self-pay

## 2021-01-29 DIAGNOSIS — Z1621 Resistance to vancomycin: Secondary | ICD-10-CM | POA: Diagnosis not present

## 2021-01-29 DIAGNOSIS — R338 Other retention of urine: Secondary | ICD-10-CM | POA: Diagnosis not present

## 2021-01-29 DIAGNOSIS — C3412 Malignant neoplasm of upper lobe, left bronchus or lung: Secondary | ICD-10-CM | POA: Diagnosis not present

## 2021-01-29 DIAGNOSIS — N390 Urinary tract infection, site not specified: Secondary | ICD-10-CM | POA: Diagnosis not present

## 2021-01-29 DIAGNOSIS — B965 Pseudomonas (aeruginosa) (mallei) (pseudomallei) as the cause of diseases classified elsewhere: Secondary | ICD-10-CM | POA: Diagnosis not present

## 2021-01-29 DIAGNOSIS — N401 Enlarged prostate with lower urinary tract symptoms: Secondary | ICD-10-CM | POA: Diagnosis not present

## 2021-01-29 NOTE — Progress Notes (Deleted)
Chronic Care Management Pharmacy Note  01/29/2021 Name:  Melvin Willis MRN:  [id number] DOB:  February 12, 1951  Subjective: Melvin Willis is an 70 y.o. year old male who is a primary patient of [name], MD.  The CCM team was consulted for assistance with disease management and care coordination needs.    Engaged with patient by telephone for follow up visit in response to provider referral for pharmacy case management and/or care coordination services.   Consent to Services:  The patient was given information about Chronic Care Management services, agreed to services, and gave verbal consent prior to initiation of services.  Please see initial visit note for detailed documentation.   Patient Care Team: [name], [name], [name], [name], [facility] (Pharmacist)  Recent office visits: 01/09/21: Patient presented to Dr. Ancil Boozer for hospital follow-up. Discussed decreasing glipizide, no medication changes made.  10/31/2020: Patient presented to Dr. Ancil Boozer for follow-up. A1c worsened to 10.5%. Starlix stopped, patient started on Soliqua. Atorvastatin increased to 40 mg daily   Recent consult visits: 10/07/2020: Patient presented to Dr. Bradd Canary (Oncology) for follow-up.   Hospital visits: 12/28/20-01/01/21: Patient hospitalized for UTI and Acute DVT. Discharged on Ciprofloxacin 500 mg BID and amoxicillin 500 mg TID. Transitiioned to Eliquis 2.5 mg twice daily.  3/10-3/16/22: Patient hospitalized for sepsis. Metformin, xarelto stopped.  Objective:  Lab Results  Component Value Date   CREATININE 1.11 01/14/2021   BUN 9 01/14/2021   GFRNONAA >60 01/14/2021   GFRAA 72 01/09/2021   NA 135 01/14/2021   K 3.6 01/14/2021   CALCIUM 8.9 01/14/2021   CO2 22 01/14/2021    Lab Results  Component Value Date/Time   HGBA1C 9.5  (H) 12/28/2020 05:02 PM   HGBA1C 10.5 (A) 10/31/2020 10:06 AM   HGBA1C 9.4 (H) 07/26/2020 10:14 AM   HGBA1C 9.8 (A) 04/07/2019 11:30 AM   MICROALBUR 50 12/29/2018 09:13 AM   MICROALBUR 20 01/05/2018 10:22 AM    Last diabetic Eye exam:  Lab Results  Component Value Date/Time   HMDIABEYEEXA No Retinopathy 05/22/2020 12:00 AM    Last diabetic Foot exam: No results found for: HMDIABFOOTEX   Lab Results  Component Value Date   CHOL 153 01/14/2021   HDL 49 01/14/2021   LDLCALC 79 01/14/2021   TRIG 124 01/14/2021   CHOLHDL 3.1 01/14/2021    Hepatic Function Latest Ref Rng & Units 01/14/2021 12/30/2020 12/28/2020  Total Protein 6.5 - 8.1 g/dL 6.4(L) 6.2(L) 7.2  Albumin 3.5 - 5.0 g/dL 3.4(L) 2.8(L) 3.7  AST 15 - 41 U/L 34 17 19  ALT 0 - 44 U/L 40 27 38  Alk Phosphatase 38 - 126 U/L 69 55 67  Total Bilirubin 0.3 - 1.2 mg/dL 0.7 0.6 0.7  Bilirubin, Direct 0.0 - 0.2 mg/dL - - -    Lab Results  Component Value Date/Time   TSH 0.854 01/01/2016 10:00 AM    CBC Latest Ref Rng & Units 01/14/2021 01/10/2021 01/01/2021  WBC 4.0 - 10.5 K/uL 5.9 - 4.4  Hemoglobin 13.0 - 17.0 g/dL 10.9(L) 13.9 12.4(L)  Hematocrit 39.0 - 52.0 % 32.0(L) 41.0 37.1(L)  Platelets 150 - 400 K/uL 230 - 146(L)    Lab Results  Component Value Date/Time   VD25OH 17 (L) 08/22/2018 11:07 AM   VD25OH 25.1 (L) 01/01/2016 10:00 AM    Clinical ASCVD: Yes  The 10-year  ASCVD risk score Mikey Bussing DC Jr., et al., 2013) is: 13.8%   Values used to calculate the score:     Age: 51 years     Sex: Male     Is Non-Hispanic African American: Yes     Diabetic: Yes     Tobacco smoker: No     Systolic Blood Pressure: 779 mmHg     Is BP treated: No     HDL Cholesterol: 49 mg/dL     Total Cholesterol: 153 mg/dL    Depression screen [facility] 2/9 01/09/2021 10/31/2020 07/26/2020  Decreased Interest 2 0 0  Down, Depressed, Hopeless 2 0 0  PHQ - 2 Score 4 0 0  Altered sleeping 0 - -  Tired, decreased energy 0 - -  Change in appetite 0 - -   Feeling bad or failure about yourself  0 - -  Trouble concentrating 0 - -  Moving slowly or fidgety/restless 0 - -  Suicidal thoughts 0 - -  PHQ-9 Score 4 - -  Difficult doing work/chores - - -  Some recent data might be hidden    Social History   Tobacco Use  Smoking Status Never Smoker  Smokeless Tobacco Never Used   BP Readings from Last 3 Encounters:  01/14/21 109/83  01/10/21 96/64  01/09/21 122/84   Pulse Readings from Last 3 Encounters:  01/14/21 100  01/10/21 (!) 108  01/09/21 84   Wt Readings from Last 3 Encounters:  01/14/21 162 lb (73.5 kg)  01/10/21 157 lb (71.2 kg)  01/09/21 157 lb (71.2 kg)    Assessment/Interventions: Review of patient past medical history, allergies, medications, health status, including review of consultants reports, laboratory and other test data, was performed as part of comprehensive evaluation and provision of chronic care management services.   SDOH:  (Social Determinants of Health) assessments and interventions performed: Yes   CCM Care Plan  No Known Allergies  Medications Reviewed Today    Reviewed by Lorrin Jackson, CMA (Certified Medical Assistant) on 01/14/21 at 1341  Med List Status: <None>  Medication Order Taking? Sig Documenting Provider Last Dose Status Informant  apixaban (ELIQUIS) 2.5 MG TABS tablet [id number] Yes Take 1 tablet (2.5 mg total) by mouth 2 (two) times daily. Cammie Sickle, MD Taking Active   atorvastatin (LIPITOR) 40 MG tablet [id number] Yes Take 1 tablet (40 mg total) by mouth daily. [name], MD Taking Active Spouse/Significant Other  blood glucose meter kit and supplies [id number] Yes Dispense based on patient and insurance preference (Accuchek Aviva Plus, Accuchek Nano). Use up to four times daily as directed. (FOR ICD-10 E10.9, E11.9). [name], MD Taking Active Spouse/Significant Other  gabapentin (NEURONTIN) 300 MG capsule [id number] Yes Take 1 capsule by mouth at bedtime   Patient taking differently: Take 300 mg by mouth at bedtime.   Cammie Sickle, MD Taking Active   glipiZIDE (GLUCOTROL) 5 MG tablet [id number] Yes Take 1 tablet (5 mg total) by mouth 2 (two) times daily before a meal. Cammie Sickle, MD Taking Active Spouse/Significant Other  HYDROcodone-acetaminophen (NORCO/VICODIN) 5-325 MG tablet [id number] No Take 1 tablet by mouth every 6 (six) hours as needed for up to 5 days for severe pain.  Patient not taking: Reported on 01/14/2021   Billey Co, MD Not Taking Active   Insulin Glargine-Lixisenatide 100-33 UNT-MCG/ML SOPN [id number] Yes Inject 15 Units into the skin daily with lunch. [provider] Taking Active Spouse/Significant Other  Insulin Pen Needle (NOVOFINE PEN NEEDLE)  32G X 6 MM MISC [id number] Yes 1 each by Does not apply route daily. [name], MD Taking Active Spouse/Significant Other  levETIRAcetam (KEPPRA) 500 MG tablet [id number] Yes Take 1 tablet (500 mg total) by mouth 2 (two) times daily. Cammie Sickle, MD Taking Active Spouse/Significant Other  lorlatinib [facility]) 100 MG tablet [id number] Yes Take 1 tablet (100 mg total) by mouth daily. Swallow tablets whole. Do not chew, crush or split tablets. Eugenie Filler, MD Taking Active   Med List Note Darl Pikes, RPH-CPP 03/30/19 1505): Rozlytrek filled at [facility]          Patient Active Problem List   Diagnosis Date Noted  . Deep vein thrombosis (DVT) of proximal lower extremity ([place])   . Acute deep vein thrombosis (DVT) of tibial vein of both lower extremities (HCC)   . Urinary retention 12/29/2020  . Leg DVT (deep venous thromboembolism), acute, bilateral ([place]) 12/29/2020  . UTI (urinary tract infection) 12/28/2020  . Delirium   . Palliative care encounter   . Pneumonia due to infectious organism   . Sepsis ([place]) 11/14/2020  . Septic shock ([place]) 11/14/2020  . Acute metabolic encephalopathy 69/79/4801  . Acute  respiratory failure ([place]) 11/14/2020  . Transaminitis 11/14/2020  . Supratherapeutic INR 11/14/2020  . Acute-on-chronic kidney injury ([place]) 11/14/2020  . Brain metastasis ([employer]) 09/25/2019  . Atherosclerosis of aorta ([place]) 09/04/2019  . Goals of care, counseling/discussion 08/02/2019  . Primary malignant neoplasm of lung metastatic to other site ([place]) 04/07/2019  . Chronic deep vein thrombosis (DVT) of left lower extremity ([place]) 01/05/2018  . Chronic kidney disease (CKD), stage III (moderate) ([place]) 11/27/2016  . Old cerebrovascular accident (CVA) without late effect   . Blurred vision, bilateral 06/23/2016  . Primary cancer of left upper lobe of lung ([place]) 06/12/2016  . Mediastinal mass   . History of nephrolithiasis 03/27/2016  . Pharyngeal dysphagia 01/01/2016  . Benign neoplasm of ascending colon   . Benign neoplasm of sigmoid colon   . Benign neoplasm of transverse colon   . Diverticulosis of large intestine without diverticulitis   . Overweight (BMI 25.0-29.9) 04/16/2015  . Type 2 diabetes mellitus with stage 3 chronic kidney disease ([place]) 03/18/2015  . Dyslipidemia 03/18/2015  . Disorder of male genital organ 08/08/2012  . 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 Elevated PSA 08/08/2012    Immunization History  Administered Date(s) Administered  . Influenza, High Dose Seasonal PF 07/10/2020  . Influenza,inj,Quad PF,6+ Mos 06/04/2016  . Influenza-Unspecified 06/04/2016, 06/17/2019  . PFIZER(Purple Top)SARS-COV-2 Vaccination 10/28/2019, 11/19/2019, 07/10/2020  . Pneumococcal Conjugate-13 08/22/2018  . Pneumococcal Polysaccharide-23 01/18/2012, 07/26/2020  . Tdap 01/18/2012  . Zoster 04/03/2016    Conditions to be addressed/monitored:  Hyperlipidemia, Diabetes, Coronary Artery Disease, Chronic Kidney Disease, BPH and Chronic DVT, and Metastatic lung cancer  There are no care plans that you recently modified to display for this patient.    Medication  Assistance: Application for Xarelto, Soliqua  medication assistance program. in process.  Anticipated assistance start date 12/08/2020.  See plan of care for additional detail.  Patient's preferred pharmacy is:  [facility] [address] [place] - [address] [place] [address] Phone: [phone] Fax: [place] [address]), [place]) [place] [address] Phone: [phone] Fax: [place], [place]. [place] [address] Phone: [phone] Fax: [place] [address] - [place] [address] [place] [address] Phone: [phone] Fax: [phone]  [facility] - [place], [place] - [place] [address] Phone: [phone] Fax: [phone]  Uses pill box? Yes Pt endorses 100% compliance  We discussed: Current pharmacy is preferred with insurance plan and patient is satisfied with pharmacy services Patient decided to: Continue current medication management strategy  Care Plan and Follow Up Patient Decision:  Patient agrees to Care Plan and Follow-up.  Plan: Telephone follow up appointment with care management team member scheduled for:  01/29/2021 at 11:00 AM  [facility] [phone]  Current Barriers:  . Unable to independently afford treatment regimen . Unable to achieve control of diabetes   Pharmacist Clinical Goal(s):  Marland Kitchen Over the next 90 days, patient will verbalize ability to afford treatment regimen . achieve control of diabetes as evidenced by A1c less than 8% through collaboration with PharmD and provider.   Interventions: . 1:1 collaboration with [name], MD regarding development and update of comprehensive plan of care as  evidenced by provider attestation and co-signature . Inter-disciplinary care team collaboration (see longitudinal plan of care) . Comprehensive medication review performed; medication list updated in electronic medical record  Hyperlipidemia: (LDL goal < 100) -Controlled -Current treatment: . Atorvastatin 40 mg daily  -Medications previously tried: NA  -Educated on Importance of limiting foods high in cholesterol; -Recommended to continue current medication  Diabetes (A1c goal <8%) -Controlled -Current medications: Marland Kitchen Glipizide 5 mg twice daily  . Soliqua 15-30 units daily (15 units)  -Medications previously tried: Metformin (AKI)  -Current home glucose readings   Fasting   2-Mar 102   1-Mar 150   28-Feb 194   27-Feb 229   26-Feb 213   25-Feb 190   24-Feb 160 Started Soliqua  Average 177    -Reports hypoglycemic/hyperglycemic symptoms -Midday was 88. Nervous, shaky, weakness,  -Educated onA1c and blood sugar goals; Prevention and management of hypoglycemic episodes; Carbohydrate counting and/or plate method -Counseled to check feet daily and get yearly eye exams -Recommended to continue current medication  Urinary Obstruction (Goal: maintain urinary flow) -Controlled -Current treatment  . Finasteride 5 mg daily  . Tamsulosin 0.4 mg daily  -Medications previously tried: NA  -Recommended to continue current medication  Metastatic lung cancer (Goal: Prevent worsening and complications of cancer) -Controlled -Current treatment  . Entrectinib 200 mg 2 capsules daily?  -Current anticoagulation treatment  . Eliquis 2.5 mg twice daily   -Medications previously tried: NA  -Recommended to continue current medication  Patient Goals/Self-Care Activities . Over the next 90 days, patient will:  - check glucose daily before breakfast, document, and provide at future appointments  Follow Up Plan: Telephone follow up appointment with care management team member scheduled for:  01/29/2021 at 11:00 AM

## 2021-02-01 DIAGNOSIS — E7800 Pure hypercholesterolemia, unspecified: Secondary | ICD-10-CM | POA: Diagnosis not present

## 2021-02-01 DIAGNOSIS — I2699 Other pulmonary embolism without acute cor pulmonale: Secondary | ICD-10-CM | POA: Diagnosis not present

## 2021-02-01 DIAGNOSIS — Z87442 Personal history of urinary calculi: Secondary | ICD-10-CM | POA: Diagnosis not present

## 2021-02-01 DIAGNOSIS — I10 Essential (primary) hypertension: Secondary | ICD-10-CM | POA: Diagnosis not present

## 2021-02-01 DIAGNOSIS — Z466 Encounter for fitting and adjustment of urinary device: Secondary | ICD-10-CM | POA: Diagnosis not present

## 2021-02-01 DIAGNOSIS — I82443 Acute embolism and thrombosis of tibial vein, bilateral: Secondary | ICD-10-CM | POA: Diagnosis not present

## 2021-02-01 DIAGNOSIS — C3412 Malignant neoplasm of upper lobe, left bronchus or lung: Secondary | ICD-10-CM | POA: Diagnosis not present

## 2021-02-01 DIAGNOSIS — Z7901 Long term (current) use of anticoagulants: Secondary | ICD-10-CM | POA: Diagnosis not present

## 2021-02-01 DIAGNOSIS — C7931 Secondary malignant neoplasm of brain: Secondary | ICD-10-CM | POA: Diagnosis not present

## 2021-02-01 DIAGNOSIS — Z7984 Long term (current) use of oral hypoglycemic drugs: Secondary | ICD-10-CM | POA: Diagnosis not present

## 2021-02-01 DIAGNOSIS — G9341 Metabolic encephalopathy: Secondary | ICD-10-CM | POA: Diagnosis not present

## 2021-02-01 DIAGNOSIS — E785 Hyperlipidemia, unspecified: Secondary | ICD-10-CM | POA: Diagnosis not present

## 2021-02-01 DIAGNOSIS — K5730 Diverticulosis of large intestine without perforation or abscess without bleeding: Secondary | ICD-10-CM | POA: Diagnosis not present

## 2021-02-01 DIAGNOSIS — N1831 Chronic kidney disease, stage 3a: Secondary | ICD-10-CM | POA: Diagnosis not present

## 2021-02-01 DIAGNOSIS — N390 Urinary tract infection, site not specified: Secondary | ICD-10-CM | POA: Diagnosis not present

## 2021-02-01 DIAGNOSIS — B965 Pseudomonas (aeruginosa) (mallei) (pseudomallei) as the cause of diseases classified elsewhere: Secondary | ICD-10-CM | POA: Diagnosis not present

## 2021-02-01 DIAGNOSIS — E1142 Type 2 diabetes mellitus with diabetic polyneuropathy: Secondary | ICD-10-CM | POA: Diagnosis not present

## 2021-02-01 DIAGNOSIS — E1122 Type 2 diabetes mellitus with diabetic chronic kidney disease: Secondary | ICD-10-CM | POA: Diagnosis not present

## 2021-02-01 DIAGNOSIS — N401 Enlarged prostate with lower urinary tract symptoms: Secondary | ICD-10-CM | POA: Diagnosis not present

## 2021-02-01 DIAGNOSIS — Z95828 Presence of other vascular implants and grafts: Secondary | ICD-10-CM | POA: Diagnosis not present

## 2021-02-01 DIAGNOSIS — R338 Other retention of urine: Secondary | ICD-10-CM | POA: Diagnosis not present

## 2021-02-01 DIAGNOSIS — Z79891 Long term (current) use of opiate analgesic: Secondary | ICD-10-CM | POA: Diagnosis not present

## 2021-02-01 DIAGNOSIS — Z1621 Resistance to vancomycin: Secondary | ICD-10-CM | POA: Diagnosis not present

## 2021-02-01 DIAGNOSIS — Z8673 Personal history of transient ischemic attack (TIA), and cerebral infarction without residual deficits: Secondary | ICD-10-CM | POA: Diagnosis not present

## 2021-02-01 DIAGNOSIS — E1165 Type 2 diabetes mellitus with hyperglycemia: Secondary | ICD-10-CM | POA: Diagnosis not present

## 2021-02-04 DIAGNOSIS — C3412 Malignant neoplasm of upper lobe, left bronchus or lung: Secondary | ICD-10-CM | POA: Diagnosis not present

## 2021-02-04 DIAGNOSIS — R338 Other retention of urine: Secondary | ICD-10-CM | POA: Diagnosis not present

## 2021-02-04 DIAGNOSIS — B965 Pseudomonas (aeruginosa) (mallei) (pseudomallei) as the cause of diseases classified elsewhere: Secondary | ICD-10-CM | POA: Diagnosis not present

## 2021-02-04 DIAGNOSIS — N390 Urinary tract infection, site not specified: Secondary | ICD-10-CM | POA: Diagnosis not present

## 2021-02-04 DIAGNOSIS — Z1621 Resistance to vancomycin: Secondary | ICD-10-CM | POA: Diagnosis not present

## 2021-02-04 DIAGNOSIS — N401 Enlarged prostate with lower urinary tract symptoms: Secondary | ICD-10-CM | POA: Diagnosis not present

## 2021-02-04 NOTE — Progress Notes (Signed)
Name: Melvin Willis   MRN: [id number]    DOB: 09/22/1950   Date:02/05/2021       Progress Note  Subjective  Chief Complaint  Follow Up  HPI  DMII: glucose is out of control, A1C was over 10 % ,his wife came in with him, she states giving him Soliqua between 15 units and 30 units before lunch. Explained that Soliqua has a long acting insulin and GLP-1 agonist. Needs to take it before breakfast and only adjust dose every 3 days based on fasting glucose level, if above 140 keep going up by 2 units if below 100 go down every 3 days by 2 units He has been out of glipizide for 10 days now, his glucose has been going up and down depending on his diet. He is off Metformin and starlix also. He is on statin therapy as prescribed and denies side effects. He is due for urine micro. Glucose likely elevated for a period of time due to chemo drug Lorlatinib   Chronic DVT left leg, history of PE : off Xarelto ( stopped March 22 due to hemorrhagic brain mets) but is now  on Eliquis because after he stopped Xarelto had recurrence of DVT, he also had IVC filter May 24 th, 2022  . He denies easy bruising. He was diagnosed with DVT and PE prior to cancer diagnosis back in 2017. He has leg pains, chronic and stable, he denies sob   Primary lung cancer left side with metastases to mediastinumbrain, he is under the care of Dr. Brahmanday, he has finished radiation by Dr. Crystal.   History of CVA: initially had dizziness , he now has weakness on left side and is getting PT he is  still on statin but off Xarelto due to hemorrhagic mets, he is now on Eliquis low dose   Atherosclerosis of aorta: he states taking Atorvastatin daily now , denies side effects . Unchanged   CKI stage III: last GFR 47  he denies pruritis, good urine output , continue to monitor . He is due for urine  micro   BPH and urinary retention : admitted 12/2020 with UTI, he is under the care of Dr. Sninisky, had a HoLEP procedure done   01/10/2021, he has urinary incontinence since procedure.   Patient Active Problem List   Diagnosis Date Noted  . Deep vein thrombosis (DVT) of proximal lower extremity (HCC)   . Acute deep vein thrombosis (DVT) of tibial vein of both lower extremities (HCC)   . Urinary retention 12/29/2020  . Leg DVT (deep venous thromboembolism), acute, bilateral (HCC) 12/29/2020  . UTI (urinary tract infection) 12/28/2020  . Delirium   . Palliative care encounter   . Pneumonia due to infectious organism   . Sepsis (HCC) 11/14/2020  . Septic shock (HCC) 11/14/2020  . Acute metabolic encephalopathy 11/14/2020  . Acute respiratory failure (HCC) 11/14/2020  . Transaminitis 11/14/2020  . Supratherapeutic INR 11/14/2020  . Acute-on-chronic kidney injury (HCC) 11/14/2020  . Brain metastasis (HCC) 09/25/2019  . Atherosclerosis of aorta (HCC) 09/04/2019  . Goals of care, counseling/discussion 08/02/2019  . Primary malignant neoplasm of lung metastatic to other site (HCC) 04/07/2019  . Chronic deep vein thrombosis (DVT) of left lower extremity (HCC) 01/05/2018  . Chronic kidney disease (CKD), stage III (moderate) (HCC) 11/27/2016  . Old cerebrovascular accident (CVA) without late effect   . Blurred vision, bilateral 06/23/2016  . Primary cancer of left upper lobe of lung (HCC) 06/12/2016  . Mediastinal mass   .   History of nephrolithiasis 03/27/2016  . Pharyngeal dysphagia 01/01/2016  . Benign neoplasm of ascending colon   . Benign neoplasm of sigmoid colon   . Benign neoplasm of transverse colon   . Diverticulosis of large intestine without diverticulitis   . Overweight (BMI 25.0-29.9) 04/16/2015  . Type 2 diabetes mellitus with stage 3 chronic kidney disease ([place]) 03/18/2015  . Dyslipidemia 03/18/2015  . Disorder of male genital organ 08/08/2012  . 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 Elevated PSA 08/08/2012    Past Surgical History:  Procedure Laterality Date  . COLONOSCOPY     . COLONOSCOPY WITH PROPOFOL N/A 05/06/2015   Procedure: COLONOSCOPY WITH PROPOFOL;  Surgeon: Lucilla Lame, MD;  Location: [place];  Service: Endoscopy;  Laterality: N/A;  ASCENDING COLON POLYPS X 2 TERMINAL ILEUM BIOPSY RANDOM COLON BX. TRANSVERSE COLON POLYP SIGMOID COLON POLYP  . ESOPHAGOGASTRODUODENOSCOPY (EGD) WITH PROPOFOL N/A 05/06/2015   Procedure: ESOPHAGOGASTRODUODENOSCOPY (EGD) WITH PROPOFOL;  Surgeon: Lucilla Lame, MD;  Location: [facility];  Service: Endoscopy;  Laterality: N/A;  GASTRIC BIOPSY X1  . HOLEP-LASER ENUCLEATION OF THE PROSTATE WITH MORCELLATION N/A 01/10/2021   Procedure: HOLEP-LASER ENUCLEATION OF THE PROSTATE WITH MORCELLATION;  Surgeon: Billey Co, MD;  Location: [facility];  Service: Urology;  Laterality: N/A;  . IVC FILTER INSERTION N/A 12/29/2020   Procedure: IVC FILTER INSERTION;  Surgeon: Dwana Curd, MD;  Location: [place] [facility];  Service: Cardiovascular;  Laterality: N/A;  . KIDNEY STONE SURGERY  2017    Family History  Problem Relation Age of Onset  . Diabetes Mother   . Diabetes Father   . CAD Father   . Dementia Father   . Diabetes Sister   . Cancer Maternal Uncle        Prostate  . Cancer Cousin        prostate    Social History   Tobacco Use  . Smoking status: Never Smoker  . Smokeless tobacco: Never Used  Substance Use Topics  . Alcohol use: No    Alcohol/week: 0.0 standard drinks     Current Outpatient Medications:  .  apixaban (ELIQUIS) 2.5 MG TABS tablet, Take 1 tablet (2.5 mg total) by mouth 2 (two) times daily., Disp: 60 tablet, Rfl: 1 .  atorvastatin (LIPITOR) 40 MG tablet, Take 1 tablet (40 mg total) by mouth daily., Disp: 90 tablet, Rfl: 0 .  gabapentin (NEURONTIN) 300 MG capsule, Take 1 capsule by mouth at bedtime (Patient taking differently: Take 300 mg by mouth at bedtime.), Disp: 90 capsule, Rfl: 1 .  glipiZIDE (GLUCOTROL) 5 MG tablet, Take 1 tablet (5 mg total) by mouth 2 (two) times  daily before a meal., Disp: 60 tablet, Rfl: 3 .  Insulin Glargine-Lixisenatide 100-33 UNT-MCG/ML SOPN, Inject 15 Units into the skin daily with lunch., Disp: , Rfl:  .  levETIRAcetam (KEPPRA) 500 MG tablet, Take 1 tablet by mouth twice daily, Disp: 60 tablet, Rfl: 0 .  lorlatinib (LORBRENA) 100 MG tablet, Take 1 tablet (100 mg total) by mouth daily. Swallow tablets whole. Do not chew, crush or split tablets., Disp: 30 tablet, Rfl: 6 .  blood glucose meter kit and supplies, Dispense based on patient and insurance preference (Accuchek Aviva Plus, Accuchek Nano). Use up to four times daily as directed. (FOR ICD-10 E10.9, E11.9). (Patient not taking: Reported on 02/05/2021), Disp: 1 each, Rfl: 0 .  Insulin Pen Needle (NOVOFINE PEN NEEDLE) 32G X 6 MM MISC, 1 each by Does not  apply route daily. (Patient not taking: Reported on 02/05/2021), Disp: 100 each, Rfl: 2  No Known Allergies  I personally reviewed active problem list, medication list, allergies, family history, social history, health maintenance with the patient/caregiver today.   ROS  Constitutional: Negative for fever or weight change.  Respiratory: Negative for cough and shortness of breath.   Cardiovascular: Negative for chest pain or palpitations.  Gastrointestinal: Negative for abdominal pain, no bowel changes.  Musculoskeletal: positive  for gait problem but no  joint swelling.  Skin: Negative for rash.  Neurological: positive  for dizziness but no  headache.  No other specific complaints in a complete review of systems (except as listed in HPI above).  Objective  Vitals:   02/05/21 0819  BP: 108/74  Pulse: 75  Resp: 16  Temp: 98.3 F (36.8 C)  TempSrc: Oral  SpO2: 96%  Weight: 160 lb (72.6 kg)  Height: 5' 4" (1.626 m)    Body mass index is 27.46 kg/m.  Physical Exam  Constitutional: Patient appears well-developed and well-nourished. No distress.  HEENT: head atraumatic, normocephalic, pupils equal and reactive to  light,neck supple Cardiovascular: Normal rate, regular rhythm and normal heart sounds.  No murmur heard. No BLE edema. Pulmonary/Chest: Effort normal and breath sounds normal. No respiratory distress. Abdominal: Soft.  There is no tenderness. Psychiatric: Patient has a normal mood and affect. behavior is normal. Judgment and thought content normal. Muscular skeletal: weaker on left side, using walker   PHQ2/9: Depression screen PHQ 2/9 02/05/2021 01/09/2021 10/31/2020 07/26/2020 04/24/2020  Decreased Interest 0 2 0 0 0  Down, Depressed, Hopeless 0 2 0 0 0  PHQ - 2 Score 0 4 0 0 0  Altered sleeping - 0 - - 0  Tired, decreased energy - 0 - - 0  Change in appetite - 0 - - 0  Feeling bad or failure about yourself  - 0 - - 0  Trouble concentrating - 0 - - 0  Moving slowly or fidgety/restless - 0 - - 0  Suicidal thoughts - 0 - - 0  PHQ-9 Score - 4 - - 0  Difficult doing work/chores - - - - Not difficult at all  Some recent data might be hidden    phq 9 is negative   Fall Risk: Fall Risk  02/05/2021 01/09/2021 10/31/2020 07/26/2020 04/24/2020  Falls in the past year? 1 0 0 0 0  Number falls in past yr: 1 0 0 0 0  Injury with Fall? 0 0 0 0 0  Risk for fall due to : History of fall(s);Impaired balance/gait;Impaired mobility - - - -  Follow up Falls evaluation completed - - - -      Functional Status Survey: Is the patient deaf or have difficulty hearing?: No Does the patient have difficulty seeing, even when wearing glasses/contacts?: No Does the patient have difficulty concentrating, remembering, or making decisions?: Yes Does the patient have difficulty walking or climbing stairs?: Yes Does the patient have difficulty dressing or bathing?: Yes Does the patient have difficulty doing errands alone such as visiting a doctor's office or shopping?: Yes    Assessment & Plan  1. Hypercholesteremia  - atorvastatin (LIPITOR) 40 MG tablet; Take 1 tablet (40 mg total) by mouth daily.  Dispense:  90 tablet; Refill: 1  2. Hyperlipidemia associated with type 2 diabetes mellitus (HCC)  - atorvastatin (LIPITOR) 40 MG tablet; Take 1 tablet (40 mg total) by mouth daily.  Dispense: 90 tablet; Refill: 1 - POCT   HgB A1C  3. Hypertension associated with type 2 diabetes mellitus ([place])  bp is under control  4. Stage 3a chronic kidney disease (HCC)  - Microalbumin / creatinine urine ratio  5. 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  Dispense: 90 tablet; Refill: 1  6. Atherosclerosis of aorta (HCC)  - atorvastatin (LIPITOR) 40 MG tablet; Take 1 tablet (40 mg total) by mouth daily.  Dispense: 90 tablet; Refill: 1  7. Primary malignant neoplasm of lung metastatic to other site, unspecified laterality ([place])   8. Chronic deep vein thrombosis (DVT) of left lower extremity, unspecified vein (HCC)  On eliquis status post ivc filter   10. Controlled type 2 diabetes mellitus with stage 3 chronic kidney disease, with long-term current use of insulin ([place])

## 2021-02-05 ENCOUNTER — Ambulatory Visit (INDEPENDENT_AMBULATORY_CARE_PROVIDER_SITE_OTHER): Payer: Medicare Other | Admitting: Family Medicine

## 2021-02-05 ENCOUNTER — Other Ambulatory Visit: Payer: Self-pay

## 2021-02-05 ENCOUNTER — Encounter: Payer: Self-pay | Admitting: Family Medicine

## 2021-02-05 VITALS — BP 108/74 | HR 75 | Temp 98.3°F | Resp 16 | Ht 64.0 in | Wt 160.0 lb

## 2021-02-05 DIAGNOSIS — I82502 Chronic embolism and thrombosis of unspecified deep veins of left lower extremity: Secondary | ICD-10-CM

## 2021-02-05 DIAGNOSIS — E785 Hyperlipidemia, unspecified: Secondary | ICD-10-CM

## 2021-02-05 DIAGNOSIS — I69354 Hemiplegia and hemiparesis following cerebral infarction affecting left non-dominant side: Secondary | ICD-10-CM | POA: Diagnosis not present

## 2021-02-05 DIAGNOSIS — N1831 Chronic kidney disease, stage 3a: Secondary | ICD-10-CM | POA: Diagnosis not present

## 2021-02-05 DIAGNOSIS — I700 Atherosclerosis of aorta: Secondary | ICD-10-CM | POA: Diagnosis not present

## 2021-02-05 DIAGNOSIS — E7800 Pure hypercholesterolemia, unspecified: Secondary | ICD-10-CM

## 2021-02-05 DIAGNOSIS — C3490 Malignant neoplasm of unspecified part of unspecified bronchus or lung: Secondary | ICD-10-CM | POA: Diagnosis not present

## 2021-02-05 DIAGNOSIS — N183 Chronic kidney disease, stage 3 unspecified: Secondary | ICD-10-CM | POA: Diagnosis not present

## 2021-02-05 DIAGNOSIS — E1169 Type 2 diabetes mellitus with other specified complication: Secondary | ICD-10-CM

## 2021-02-05 DIAGNOSIS — E1122 Type 2 diabetes mellitus with diabetic chronic kidney disease: Secondary | ICD-10-CM

## 2021-02-05 DIAGNOSIS — E1159 Type 2 diabetes mellitus with other circulatory complications: Secondary | ICD-10-CM | POA: Diagnosis not present

## 2021-02-05 DIAGNOSIS — Z794 Long term (current) use of insulin: Secondary | ICD-10-CM

## 2021-02-05 DIAGNOSIS — I152 Hypertension secondary to endocrine disorders: Secondary | ICD-10-CM

## 2021-02-05 LAB — POCT GLYCOSYLATED HEMOGLOBIN (HGB A1C): Hemoglobin A1C: 7.7 % — AB (ref 4.0–5.6)

## 2021-02-05 MED ORDER — ATORVASTATIN CALCIUM 40 MG PO TABS: 40.0000 mg | ORAL_TABLET | Freq: Every day | ORAL | 1 refills | Status: DC

## 2021-02-05 NOTE — Patient Instructions (Signed)
Soliqua 22 units daily, only adjust dose every three days if glucose stays below 100 go down by 2 units for 3 days, if above 140 fasting for an average of 3 days go up by 2 units  Max of 50 units per day

## 2021-02-06 ENCOUNTER — Other Ambulatory Visit: Payer: Self-pay | Admitting: Oncology

## 2021-02-10 DIAGNOSIS — N390 Urinary tract infection, site not specified: Secondary | ICD-10-CM | POA: Diagnosis not present

## 2021-02-10 DIAGNOSIS — N401 Enlarged prostate with lower urinary tract symptoms: Secondary | ICD-10-CM | POA: Diagnosis not present

## 2021-02-10 DIAGNOSIS — C3412 Malignant neoplasm of upper lobe, left bronchus or lung: Secondary | ICD-10-CM | POA: Diagnosis not present

## 2021-02-10 DIAGNOSIS — R338 Other retention of urine: Secondary | ICD-10-CM | POA: Diagnosis not present

## 2021-02-10 DIAGNOSIS — B965 Pseudomonas (aeruginosa) (mallei) (pseudomallei) as the cause of diseases classified elsewhere: Secondary | ICD-10-CM | POA: Diagnosis not present

## 2021-02-10 DIAGNOSIS — Z1621 Resistance to vancomycin: Secondary | ICD-10-CM | POA: Diagnosis not present

## 2021-02-11 ENCOUNTER — Inpatient Hospital Stay: Payer: Medicare Other | Attending: Internal Medicine

## 2021-02-11 ENCOUNTER — Encounter: Payer: Self-pay | Admitting: Internal Medicine

## 2021-02-11 ENCOUNTER — Inpatient Hospital Stay (HOSPITAL_BASED_OUTPATIENT_CLINIC_OR_DEPARTMENT_OTHER): Payer: Medicare Other | Admitting: Internal Medicine

## 2021-02-11 DIAGNOSIS — R972 Elevated prostate specific antigen [PSA]: Secondary | ICD-10-CM | POA: Insufficient documentation

## 2021-02-11 DIAGNOSIS — Z8673 Personal history of transient ischemic attack (TIA), and cerebral infarction without residual deficits: Secondary | ICD-10-CM | POA: Diagnosis not present

## 2021-02-11 DIAGNOSIS — Z923 Personal history of irradiation: Secondary | ICD-10-CM | POA: Insufficient documentation

## 2021-02-11 DIAGNOSIS — Z79899 Other long term (current) drug therapy: Secondary | ICD-10-CM | POA: Diagnosis not present

## 2021-02-11 DIAGNOSIS — Z8042 Family history of malignant neoplasm of prostate: Secondary | ICD-10-CM | POA: Insufficient documentation

## 2021-02-11 DIAGNOSIS — F0390 Unspecified dementia without behavioral disturbance: Secondary | ICD-10-CM | POA: Insufficient documentation

## 2021-02-11 DIAGNOSIS — Z634 Disappearance and death of family member: Secondary | ICD-10-CM | POA: Insufficient documentation

## 2021-02-11 DIAGNOSIS — Z596 Low income: Secondary | ICD-10-CM | POA: Diagnosis not present

## 2021-02-11 DIAGNOSIS — C3412 Malignant neoplasm of upper lobe, left bronchus or lung: Secondary | ICD-10-CM | POA: Diagnosis not present

## 2021-02-11 DIAGNOSIS — C7951 Secondary malignant neoplasm of bone: Secondary | ICD-10-CM | POA: Insufficient documentation

## 2021-02-11 DIAGNOSIS — E1122 Type 2 diabetes mellitus with diabetic chronic kidney disease: Secondary | ICD-10-CM | POA: Insufficient documentation

## 2021-02-11 DIAGNOSIS — Z8249 Family history of ischemic heart disease and other diseases of the circulatory system: Secondary | ICD-10-CM | POA: Insufficient documentation

## 2021-02-11 DIAGNOSIS — R5383 Other fatigue: Secondary | ICD-10-CM | POA: Diagnosis not present

## 2021-02-11 DIAGNOSIS — C778 Secondary and unspecified malignant neoplasm of lymph nodes of multiple regions: Secondary | ICD-10-CM | POA: Diagnosis not present

## 2021-02-11 DIAGNOSIS — R410 Disorientation, unspecified: Secondary | ICD-10-CM | POA: Insufficient documentation

## 2021-02-11 DIAGNOSIS — Z833 Family history of diabetes mellitus: Secondary | ICD-10-CM | POA: Insufficient documentation

## 2021-02-11 DIAGNOSIS — M2550 Pain in unspecified joint: Secondary | ICD-10-CM | POA: Insufficient documentation

## 2021-02-11 DIAGNOSIS — Z86711 Personal history of pulmonary embolism: Secondary | ICD-10-CM | POA: Insufficient documentation

## 2021-02-11 DIAGNOSIS — Z9079 Acquired absence of other genital organ(s): Secondary | ICD-10-CM | POA: Diagnosis not present

## 2021-02-11 DIAGNOSIS — C7931 Secondary malignant neoplasm of brain: Secondary | ICD-10-CM | POA: Diagnosis not present

## 2021-02-11 DIAGNOSIS — I82453 Acute embolism and thrombosis of peroneal vein, bilateral: Secondary | ICD-10-CM | POA: Diagnosis not present

## 2021-02-11 DIAGNOSIS — Z818 Family history of other mental and behavioral disorders: Secondary | ICD-10-CM | POA: Insufficient documentation

## 2021-02-11 DIAGNOSIS — Z8719 Personal history of other diseases of the digestive system: Secondary | ICD-10-CM | POA: Diagnosis not present

## 2021-02-11 DIAGNOSIS — R443 Hallucinations, unspecified: Secondary | ICD-10-CM | POA: Diagnosis not present

## 2021-02-11 DIAGNOSIS — N183 Chronic kidney disease, stage 3 unspecified: Secondary | ICD-10-CM | POA: Insufficient documentation

## 2021-02-11 DIAGNOSIS — R413 Other amnesia: Secondary | ICD-10-CM | POA: Diagnosis not present

## 2021-02-11 DIAGNOSIS — Z7901 Long term (current) use of anticoagulants: Secondary | ICD-10-CM | POA: Diagnosis not present

## 2021-02-11 LAB — CBC WITH DIFFERENTIAL/PLATELET
Abs Immature Granulocytes: 0.04 10*3/uL (ref 0.00–0.07)
Basophils Absolute: 0 10*3/uL (ref 0.0–0.1)
Basophils Relative: 0 %
Eosinophils Absolute: 0.2 10*3/uL (ref 0.0–0.5)
Eosinophils Relative: 2 %
HCT: 37.4 % — ABNORMAL LOW (ref 39.0–52.0)
Hemoglobin: 12.3 g/dL — ABNORMAL LOW (ref 13.0–17.0)
Immature Granulocytes: 1 %
Lymphocytes Relative: 32 %
Lymphs Abs: 2.1 10*3/uL (ref 0.7–4.0)
MCH: 28.5 pg (ref 26.0–34.0)
MCHC: 32.9 g/dL (ref 30.0–36.0)
MCV: 86.8 fL (ref 80.0–100.0)
Monocytes Absolute: 1.1 10*3/uL — ABNORMAL HIGH (ref 0.1–1.0)
Monocytes Relative: 16 %
Neutro Abs: 3.3 10*3/uL (ref 1.7–7.7)
Neutrophils Relative %: 49 %
Platelets: 249 10*3/uL (ref 150–400)
RBC: 4.31 MIL/uL (ref 4.22–5.81)
RDW: 14.1 % (ref 11.5–15.5)
WBC: 6.7 10*3/uL (ref 4.0–10.5)
nRBC: 0 % (ref 0.0–0.2)

## 2021-02-11 LAB — COMPREHENSIVE METABOLIC PANEL
ALT: 34 U/L (ref 0–44)
AST: 24 U/L (ref 15–41)
Albumin: 3.8 g/dL (ref 3.5–5.0)
Alkaline Phosphatase: 70 U/L (ref 38–126)
Anion gap: 11 (ref 5–15)
BUN: 17 mg/dL (ref 8–23)
CO2: 22 mmol/L (ref 22–32)
Calcium: 10 mg/dL (ref 8.9–10.3)
Chloride: 106 mmol/L (ref 98–111)
Creatinine, Ser: 1.5 mg/dL — ABNORMAL HIGH (ref 0.61–1.24)
GFR, Estimated: 51 mL/min — ABNORMAL LOW (ref >60)
Glucose, Bld: 127 mg/dL — ABNORMAL HIGH (ref 70–99)
Potassium: 3.8 mmol/L (ref 3.5–5.1)
Sodium: 139 mmol/L (ref 135–145)
Total Bilirubin: 0.5 mg/dL (ref 0.3–1.2)
Total Protein: 7.3 g/dL (ref 6.5–8.1)

## 2021-02-11 MED ORDER — TRAZODONE HCL 50 MG PO TABS: 50.0000 mg | ORAL_TABLET | Freq: Every day | ORAL | 0 refills | Status: DC

## 2021-02-11 NOTE — Assessment & Plan Note (Addendum)
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   #Last visit/1 month ago patient was asked to hold lorlatinib esp with improved MRI brain/and declining PS.  However, unfortunately patient continued to take Lorlatinib which is currently attributing to his CNS side effects [see below].    #Fatigue/confusion/insomnia/hallucination-multifactorial however suspect Lorlatinib contributing to the majority of the side effects.  We will recommend holding lorlatinib for now.  Also recommend evaluation with neuro oncology, Dr. Mickeal Skinner for an expert insight.  #Brain metastases - [facility] 2022]progressive disease in the brain-status post whole brain radiation; MRI brain- May 24th- improved.  Clinically stable.  # Poorly controlled Blood glucose [also likely secondary to lorlatinib]/127 PBF;continue continue Lantus 15 units at night.  Currently stable.  # Bilateral PE & left lower extremity DVT:s/p IVC filter; currently on eliquis 2.5 g BID [5/13]  # CKD stage III-creatinine 1.5 [GFR~51]- STABLE  # Peripheral Neuropathy- S stable? 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  I spoke at length with the patient's wife/patient's daughter over the phone- regarding the patient's clinical status/plan of care.  Family agreement.   # DISPOSITION:  # referral to Dr.Vaslow re: confusion/hallucination Hx of lung ca to brain # follow up in 4 weeks-MD; labs cbc/cmp-Dr.B  Cc; Dr.Sowles.

## 2021-02-11 NOTE — Patient Instructions (Signed)
#   HOLD LOBRENA unitl further directions.

## 2021-02-11 NOTE — Progress Notes (Signed)
[facility] OFFICE PROGRESS NOTE  Patient Care Team: [name], [name], [name], [name], [facility] (Pharmacist)  Cancer Staging No matching staging information was found for the patient.   Oncology History Overview Note  # OCT 2017- ADENO CA LUNG; STAGE IV [; LUL; bil supraclavicular LN; Left neck LN Bx]; ROS-1 MUTATED; s/p Carbo-alimta x1[oct 2017]  # NOV 1st 2017- XALKORI 250 mg BID; JAN 15th CT- PR;  # AUG 27th Chemo-RT to persistent LUL/mediastinal LN [s/p carbo-taxol- with RT; finished Sep 22nd 2018];   # July 14th 2020- multiple metastatic lesions of the brain [July 30 whole brain radiation finished April 21, 2019]; bone scan skull metastases /posterior left ninth rib metastases; April 04, 2019 stopped crizotinib;  #April 25, 2019-start Entrectinib 600 mg once a day; STopped in NOV 2020 [dizziness]; NOV 2020-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  # DEC 1st 2020- start avastin q 2w; x6; MRI October 30, 2019-significantly improved left temporal lesion subcentimeter for stable lesions.;  [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  In the interim patient also underwent-HOLEP on May 6.   # LLE DVT/bil PE/Multiple strokes [? 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  # MRI  brain- multiple infarcts [2d echo/bubble study-NEG's/p Neurology eval]  ------------------------------------------------------------    # # s/p TURP [Sep 2017; Dr.Cope] DEC 26th CT- distended bladder;  # Elevated PSA-  FEB 2021- 12;  S/p Bx- Negative; follow with urology/Dr.Stoioff   # MOLECULAR TESTING- ROS-1 POSITIVE; ALK/EGFR-NEG; PDL-1 EXPRESSION- 90%** [HIGH]  # DEC 15th 2020- PALLIATIVE CARE -----------------------------------------------------------------    DIAGNOSIS: Adenocarcinoma the lung  ROS-1 +  STAGE:  IV    ;GOALS: Palliative  CURRENT/MOST RECENT THERAPY- Avastin [C]   Primary cancer of left upper lobe of lung ([place])  08/09/2019 -  Chemotherapy   The patient had bevacizumab-awwb (MVASI) 800 mg in sodium chloride 0.9 % 100 mL chemo infusion, 725 mg, Intravenous,  Once, 6 of 6 cycles Dose modification: 10 mg/kg (original dose 10 mg/kg, Cycle 3, Reason: Other (see comments), Comment: weight change) Administration: 800 mg (08/09/2019), 800 mg (08/22/2019), 700 mg (09/19/2019), 700 mg (10/04/2019), 700 mg (10/18/2019), 700 mg (11/01/2019)  for chemotherapy treatment.        INTERVAL HISTORY:  Melvin Willis 70 y.o.  male pleasant patient above history of metastatic lung cancer-to brain Ros-1 positive-most recently progressed in the brain s/p whole brain radiation-currently on lorlatinib is here for follow-up.  Patient was recommended to stop lorlatinib; however because of miscommunication patient continued lorlatinib.  As per the family patient-episodes of insomnia/agitation and also hallucinations night.  The patient had episodes of intermittent confusion.  Patient goes around in the house with a cane/walker.  No falls.  He is currently back on Eliquis.  He has not had any hematuria.  Currently off steroids.  Denies any worsening headache.   Review of Systems  Constitutional: Positive for malaise/fatigue. Negative  for chills, diaphoresis, fever and weight loss.   HENT: Negative for nosebleeds and sore throat.   Eyes: Negative for double vision.  Respiratory: Negative for cough, hemoptysis, sputum production, shortness of breath and wheezing.   Cardiovascular: Negative for chest pain (Left upper chest wall pain chronic), palpitations and orthopnea.  Gastrointestinal: Negative for abdominal pain, blood in stool, constipation, diarrhea, heartburn and melena.  Genitourinary: Negative for dysuria.  Musculoskeletal: Positive for joint pain. Negative for back pain.  Skin: Negative.  Negative for itching and rash.  Neurological: Positive for tingling. Negative for focal weakness and weakness.  Endo/Heme/Allergies: Does not bruise/bleed easily.  Psychiatric/Behavioral: Positive for memory loss. Negative for depression. The patient is not nervous/anxious and does not have insomnia.     PAST MEDICAL HISTORY :  Past Medical History:  Diagnosis Date  . Abnormal prostate specific antigen 08/08/2012  . Adiposity 04/16/2015  . Chronic kidney disease (CKD), stage III (moderate) ([place]) 11/27/2016  . CVA (cerebral vascular accident) ([place]) 06/17/2016  . Diabetes mellitus without complication ([place])   . Diverticulosis of sigmoid colon 04/16/2015  . Dyslipidemia 03/18/2015  . Hemorrhoids, internal 04/16/2015  . Hypercholesteremia 04/16/2015  . Hyperlipidemia   . Hypertension   . Primary cancer of left upper lobe of lung ([place])   . Pulmonary embolism ([place])   . Wears dentures    partial upper    PAST SURGICAL HISTORY :   Past Surgical History:  Procedure Laterality Date  . COLONOSCOPY    . COLONOSCOPY WITH PROPOFOL N/A 05/06/2015   Procedure: COLONOSCOPY WITH PROPOFOL;  Surgeon: Lucilla Lame, MD;  Location: [place];  Service: Endoscopy;  Laterality: N/A;  ASCENDING COLON POLYPS X 2 TERMINAL ILEUM BIOPSY RANDOM COLON BX. TRANSVERSE COLON POLYP SIGMOID COLON POLYP  . ESOPHAGOGASTRODUODENOSCOPY (EGD) WITH PROPOFOL N/A 05/06/2015   Procedure:  ESOPHAGOGASTRODUODENOSCOPY (EGD) WITH PROPOFOL;  Surgeon: Lucilla Lame, MD;  Location: [facility];  Service: Endoscopy;  Laterality: N/A;  GASTRIC BIOPSY X1  . HOLEP-LASER ENUCLEATION OF THE PROSTATE WITH MORCELLATION N/A 01/10/2021   Procedure: HOLEP-LASER ENUCLEATION OF THE PROSTATE WITH MORCELLATION;  Surgeon: Billey Co, MD;  Location: [facility];  Service: Urology;  Laterality: N/A;  . IVC FILTER INSERTION N/A 12/29/2020   Procedure: IVC FILTER INSERTION;  Surgeon: Dwana Curd, MD;  Location: [place] [facility];  Service: Cardiovascular;  Laterality: N/A;  . KIDNEY STONE SURGERY  2017    FAMILY HISTORY :   Family History  Problem Relation Age of Onset  . Diabetes Mother   . Diabetes Father   . CAD Father   . Dementia Father   . Diabetes Sister   . Cancer Maternal Uncle        Prostate  . Cancer Cousin        prostate    SOCIAL HISTORY:   Social History   Tobacco Use  . Smoking status: Never Smoker  . Smokeless tobacco: Never Used  Vaping Use  . Vaping Use: Never used  Substance Use Topics  . Alcohol use: No    Alcohol/week: 0.0 standard drinks  . Drug use: No    ALLERGIES:  has No Known Allergies.  MEDICATIONS:  Current Outpatient Medications  Medication Sig Dispense Refill  . apixaban (ELIQUIS) 2.5 MG TABS tablet Take 1 tablet (2.5 mg total) by mouth 2 (two) times daily. 60 tablet 1  . atorvastatin (LIPITOR) 40 MG tablet Take 1 tablet (40 mg total) by mouth daily. 90 tablet 1  . blood glucose  meter kit and supplies Dispense based on patient and insurance preference (Accuchek Aviva Plus, Accuchek Nano). Use up to four times daily as directed. (FOR ICD-10 E10.9, E11.9). 1 each 0  . gabapentin (NEURONTIN) 300 MG capsule Take 1 capsule by mouth at bedtime (Patient taking differently: Take 300 mg by mouth at bedtime.) 90 capsule 1  . Insulin Glargine-Lixisenatide 100-33 UNT-MCG/ML SOPN Inject 15 Units into the skin daily with lunch.    . levETIRAcetam  (KEPPRA) 500 MG tablet Take 1 tablet by mouth twice daily 60 tablet 0  . lorlatinib (LORBRENA) 100 MG tablet Take 1 tablet (100 mg total) by mouth daily. Swallow tablets whole. Do not chew, crush or split tablets. 30 tablet 6  . traZODone (DESYREL) 50 MG tablet Take 1 tablet (50 mg total) by mouth at bedtime. 30 tablet 0  . Insulin Pen Needle (NOVOFINE PEN NEEDLE) 32G X 6 MM MISC 1 each by Does not apply route daily. (Patient not taking: No sig reported) 100 each 2   No current facility-administered medications for this visit.    PHYSICAL EXAMINATION: ECOG PERFORMANCE STATUS: 0 - Asymptomatic  BP 134/87 (BP Location: Right Arm, Patient Position: Sitting)   Pulse 98   Temp (!) 97.5 F (36.4 C) (Tympanic)   Wt 158 lb 9.6 oz (71.9 kg)   SpO2 96%   BMI 27.22 kg/m   Filed Weights   02/11/21 1513  Weight: 158 lb 9.6 oz (71.9 kg)    Physical Exam Constitutional:      Comments: Patient is accompanied by his wife.  He is in a wheelchair.  HENT:     Head: Normocephalic and atraumatic.     Mouth/Throat:     Pharynx: No oropharyngeal exudate.  Eyes:     Pupils: Pupils are equal, round, and reactive to light.  Cardiovascular:     Rate and Rhythm: Normal rate and regular rhythm.  Pulmonary:     Effort: No respiratory distress.     Breath sounds: No wheezing.     Comments: Decreased breath sounds bilaterally no wheeze or crackles. Abdominal:     General: Bowel sounds are normal. There is no distension.     Palpations: Abdomen is soft. There is no mass.     Tenderness: There is no abdominal tenderness. There is no guarding or rebound.  Musculoskeletal:        General: No tenderness. Normal range of motion.     Cervical back: Normal range of motion and neck supple.  Skin:    General: Skin is warm.  Neurological:     Mental Status: He is alert and oriented to person, place, and time.  Psychiatric:        Mood and Affect: Affect normal.    LABORATORY DATA:  I have reviewed the  data as listed    Component Value Date/Time   NA 139 02/11/2021 1453   NA 139 01/01/2016 1000   K 3.8 02/11/2021 1453   CL 106 02/11/2021 1453   CO2 22 02/11/2021 1453   GLUCOSE 127 (H) 02/11/2021 1453   BUN 17 02/11/2021 1453   BUN 13 01/01/2016 1000   CREATININE 1.50 (H) 02/11/2021 1453   CREATININE 1.20 01/09/2021 1226   CALCIUM 10.0 02/11/2021 1453   PROT 7.3 02/11/2021 1453   PROT 7.2 01/01/2016 1000   ALBUMIN 3.8 02/11/2021 1453   ALBUMIN 4.4 01/01/2016 1000   AST 24 02/11/2021 1453   ALT 34 02/11/2021 1453   ALKPHOS 70 02/11/2021 1453  BILITOT 0.5 02/11/2021 1453   BILITOT 0.4 01/01/2016 1000   GFRNONAA 51 (L) 02/11/2021 1453   GFRNONAA 62 01/09/2021 1226   GFRAA 72 01/09/2021 1226    No results found for: SPEP, UPEP  Lab Results  Component Value Date   WBC 6.7 02/11/2021   NEUTROABS 3.3 02/11/2021   HGB 12.3 (L) 02/11/2021   HCT 37.4 (L) 02/11/2021   MCV 86.8 02/11/2021   PLT 249 02/11/2021      Chemistry      Component Value Date/Time   NA 139 02/11/2021 1453   NA 139 01/01/2016 1000   K 3.8 02/11/2021 1453   CL 106 02/11/2021 1453   CO2 22 02/11/2021 1453   BUN 17 02/11/2021 1453   BUN 13 01/01/2016 1000   CREATININE 1.50 (H) 02/11/2021 1453   CREATININE 1.20 01/09/2021 1226      Component Value Date/Time   CALCIUM 10.0 02/11/2021 1453   ALKPHOS 70 02/11/2021 1453   AST 24 02/11/2021 1453   ALT 34 02/11/2021 1453   BILITOT 0.5 02/11/2021 1453   BILITOT 0.4 01/01/2016 1000       RADIOGRAPHIC STUDIES: I have personally reviewed the radiological images as listed and agreed with the findings in the report. No results found.   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   #Last  visit/1 month ago patient was asked to hold lorlatinib esp with improved MRI brain/and declining PS.  However, unfortunately patient continued to take Lorlatinib which is currently attributing to his CNS side effects [see below].    #Fatigue/confusion/insomnia/hallucination-multifactorial however suspect Lorlatinib contributing to the majority of the side effects.  We will recommend holding lorlatinib for now.  Also recommend evaluation with neuro oncology, Dr. Mickeal Skinner for an expert insight.  #Brain metastases - [facility] 2022]progressive disease in the brain-status post whole brain radiation; MRI brain- May 24th- improved.  Clinically stable.  # Poorly controlled Blood glucose [also likely secondary to lorlatinib]/127 PBF;continue continue Lantus 15 units at night.  Currently stable.  # Bilateral PE & left lower extremity DVT:s/p IVC filter; currently on eliquis 2.5 g BID [5/13]  # CKD stage III-creatinine 1.5 [GFR~51]- STABLE  # Peripheral Neuropathy- S stable? 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  I spoke at length with the patient's wife/patient's daughter over the phone- regarding the patient's clinical status/plan of care.  Family agreement.   # DISPOSITION:  # referral to Dr.Vaslow re: confusion/hallucination Hx of lung ca to brain # follow up in 4 weeks-MD; labs cbc/cmp-Dr.B  Cc; Dr.Sowles.    No orders of the defined types were placed in this encounter.  All questions were answered. The patient knows to call the clinic with any problems, questions or concerns.      Cammie Sickle, MD 02/11/2021 8:21 PM

## 2021-02-14 ENCOUNTER — Other Ambulatory Visit: Payer: Self-pay

## 2021-02-14 ENCOUNTER — Encounter: Payer: Self-pay | Admitting: Internal Medicine

## 2021-02-14 ENCOUNTER — Inpatient Hospital Stay (HOSPITAL_BASED_OUTPATIENT_CLINIC_OR_DEPARTMENT_OTHER): Payer: Medicare Other | Admitting: Internal Medicine

## 2021-02-14 VITALS — BP 114/90 | HR 95 | Temp 96.0°F | Resp 18 | Wt 157.8 lb

## 2021-02-14 DIAGNOSIS — C778 Secondary and unspecified malignant neoplasm of lymph nodes of multiple regions: Secondary | ICD-10-CM | POA: Diagnosis not present

## 2021-02-14 DIAGNOSIS — C7931 Secondary malignant neoplasm of brain: Secondary | ICD-10-CM | POA: Diagnosis not present

## 2021-02-14 DIAGNOSIS — C7951 Secondary malignant neoplasm of bone: Secondary | ICD-10-CM | POA: Diagnosis not present

## 2021-02-14 DIAGNOSIS — I82453 Acute embolism and thrombosis of peroneal vein, bilateral: Secondary | ICD-10-CM | POA: Diagnosis not present

## 2021-02-14 DIAGNOSIS — R972 Elevated prostate specific antigen [PSA]: Secondary | ICD-10-CM | POA: Diagnosis not present

## 2021-02-14 DIAGNOSIS — C3412 Malignant neoplasm of upper lobe, left bronchus or lung: Secondary | ICD-10-CM | POA: Diagnosis not present

## 2021-02-14 MED ORDER — QUETIAPINE FUMARATE 25 MG PO TABS: 25.0000 mg | ORAL_TABLET | Freq: Every evening | ORAL | 1 refills | Status: DC | PRN

## 2021-02-14 NOTE — Progress Notes (Signed)
[facility] [place], [place] [address] [phone]   New Patient Evaluation  Date of Service: 02/14/21 Patient Name: Melvin Willis Patient MRN: [id number] Patient DOB: [birth year] Provider: Ventura Sellers, MD  Identifying Statement:  Melvin Willis is a 70 y.o. male with Brain metastasis [facility]) who presents for initial consultation and evaluation regarding cancer associated neurologic deficits.    Referring Provider: [name], MD [address] [place],  [place] [address]  Primary Cancer:  Oncologic History: Oncology History Overview Note  # OCT 2017- ADENO CA LUNG; STAGE IV [; LUL; bil supraclavicular LN; Left neck LN Bx]; ROS-1 MUTATED; s/p Carbo-alimta x1[oct 2017]  # NOV 1st 2017- XALKORI 250 mg BID; JAN 15th CT- PR;  # AUG 27th Chemo-RT to persistent LUL/mediastinal LN [s/p carbo-taxol- with RT; finished Sep 22nd 2018];   # July 14th 2020- multiple metastatic lesions of the brain [July 30 whole brain radiation finished April 21, 2019]; bone scan skull metastases /posterior left ninth rib metastases; April 04, 2019 stopped crizotinib;  #April 25, 2019-start Entrectinib 600 mg once a day; STopped in NOV 2020 [dizziness]; NOV 2020-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  # DEC 1st 2020- start avastin q 2w; x6; MRI October 30, 2019-significantly improved left temporal lesion subcentimeter for stable lesions.;  [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  In the interim patient also underwent-HOLEP on May 6.   # LLE DVT/bil PE/Multiple strokes [? 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  # MRI brain- multiple infarcts [2d echo/bubble study-NEG's/p Neurology eval]  ------------------------------------------------------------    # # s/p TURP [Sep 2017; Dr.Cope] DEC 26th CT- distended bladder;  # Elevated PSA-  FEB 2021- 12;  S/p Bx- Negative; follow with urology/Dr.Stoioff   # MOLECULAR TESTING- ROS-1 POSITIVE; ALK/EGFR-NEG; PDL-1 EXPRESSION- 90%** [HIGH]  # DEC 15th 2020- PALLIATIVE CARE -----------------------------------------------------------------    DIAGNOSIS: 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      CNS Oncologic History 04/21/19: Completes WBRT 30/57f ([place])  History of Present Illness: The patient's records from the referring physician were obtained and reviewed and the patient interviewed to confirm this HPI.  Melvin L WBarringerpresents to clinic today to review recent mental status changes.  Spouse describes worsening memory impairment, speed of communication over the past two years.  In recent weeks, he has become agitated and difficult to manage at night at times.  One  evening he woke up and went outside on the porch unsupervised.  At home,  he is not independent with ADLs.  He does not dress himself without help, does not prepare meals, does not toilet independently.  Spouse noted he has mentioned seeing deceased relatives several times, but he has not behaved in a way that is harmful to himself or others.  Extensive history of stroke in addition to brain metastases, completed whole brain radiation therapy in August 2020 with Dr. Baruch Gouty.  Recently stopped oral chemotherapy with Dr. Rogue Bussing.       Medications: Current Outpatient Medications on File Prior to Visit  Medication Sig Dispense Refill   apixaban (ELIQUIS) 2.5 MG TABS tablet Take 1 tablet (2.5 mg total) by mouth 2 (two) times daily. 60 tablet 1   atorvastatin (LIPITOR) 40 MG tablet Take 1 tablet (40 mg total) by mouth daily. 90 tablet 1   blood glucose meter kit and supplies Dispense based on patient and insurance preference (Accuchek Aviva Plus, Accuchek Nano). Use up to four times daily as directed. (FOR ICD-10 E10.9, E11.9). 1 each 0   gabapentin (NEURONTIN) 300 MG capsule Take 1 capsule by mouth at bedtime (Patient taking differently: Take 300 mg by mouth at bedtime.) 90 capsule 1   Insulin Glargine-Lixisenatide 100-33 UNT-MCG/ML SOPN Inject 15 Units into the skin daily with lunch.     Insulin Pen Needle (NOVOFINE PEN NEEDLE) 32G X 6 MM MISC 1 each by Does not apply route daily. (Patient not taking: No sig reported) 100 each 2   levETIRAcetam (KEPPRA) 500 MG tablet Take 1 tablet by mouth twice daily 60 tablet 0   lorlatinib (LORBRENA) 100 MG tablet Take 1 tablet (100 mg total) by mouth daily. Swallow tablets whole. Do not chew, crush or split tablets. 30 tablet 6   traZODone (DESYREL) 50 MG tablet Take 1 tablet (50 mg total) by mouth at bedtime. 30 tablet 0   No current facility-administered medications on file prior to visit.    Allergies: No Known Allergies Past Medical History:  Past Medical History:  Diagnosis Date   Abnormal prostate specific antigen  08/08/2012   Adiposity 04/16/2015   Chronic kidney disease (CKD), stage III (moderate) ([place]) 11/27/2016   CVA (cerebral vascular accident) ([place]) 06/17/2016   Diabetes mellitus without complication ([place])    Diverticulosis of sigmoid colon 04/16/2015   Dyslipidemia 03/18/2015   Hemorrhoids, internal 04/16/2015   Hypercholesteremia 04/16/2015   Hyperlipidemia    Hypertension    Primary cancer of left upper lobe of lung ([place])    Pulmonary embolism ([place])    Wears dentures    partial upper   Past Surgical History:  Past Surgical History:  Procedure Laterality Date   COLONOSCOPY     COLONOSCOPY WITH PROPOFOL N/A 05/06/2015   Procedure: COLONOSCOPY WITH PROPOFOL;  Surgeon: Lucilla Lame, MD;  Location: [facility];  Service: Endoscopy;  Laterality: N/A;  ASCENDING COLON POLYPS X 2 TERMINAL ILEUM BIOPSY RANDOM COLON BX. TRANSVERSE COLON POLYP SIGMOID COLON POLYP   ESOPHAGOGASTRODUODENOSCOPY (EGD) WITH PROPOFOL N/A 05/06/2015   Procedure: ESOPHAGOGASTRODUODENOSCOPY (EGD) WITH PROPOFOL;  Surgeon: Lucilla Lame, MD;  Location: [facility];  Service: Endoscopy;  Laterality: N/A;  GASTRIC BIOPSY X1   HOLEP-LASER ENUCLEATION OF THE PROSTATE WITH MORCELLATION N/A 01/10/2021   Procedure: HOLEP-LASER ENUCLEATION OF THE PROSTATE WITH MORCELLATION;  Surgeon: Billey Co, MD;  Location: [facility];  Service: Urology;  Laterality: N/A;   IVC FILTER INSERTION N/A 12/29/2020   Procedure: IVC  FILTER INSERTION;  Surgeon: Dwana Curd, MD;  Location: [facility];  Service: Cardiovascular;  Laterality: N/A;   KIDNEY STONE SURGERY  2017   Social History:  Social History   Socioeconomic History   Marital status: Married    Spouse name: Melissa    Number of children: 3   Years of education: Not on file   Highest education level: Not on file  Occupational History   Occupation: disable     Comment: from CVA and lung cancer  Tobacco Use   Smoking status: Never   Smokeless tobacco: Never   Vaping Use   Vaping Use: Never used  Substance and Sexual Activity   Alcohol use: No    Alcohol/week: 0.0 standard drinks   Drug use: No   Sexual activity: Not Currently    Partners: Female    Birth control/protection: None  Other Topics Concern   Not on file  Social History Narrative   Used to work until 2 years ago when got sick with DVT leg , PE , CVA and found out he had lung cancer. He has medicare now    Social Determinants of Radio broadcast assistant Strain: High Risk   Difficulty of Paying Living Expenses: Very hard  Food Insecurity: No Food Insecurity   Worried About Charity fundraiser in the Last Year: Never true   Arboriculturist in the Last Year: Never true  Transportation Needs: No Transportation Needs   Lack of Transportation (Medical): No   Lack of Transportation (Non-Medical): No  Physical Activity: Inactive   Days of Exercise per Week: 0 days   Minutes of Exercise per Session: 0 min  Stress: No Stress Concern Present   Feeling of Stress : Not at all  Social Connections: Moderately Integrated   Frequency of Communication with Friends and Family: More than three times a week   Frequency of Social Gatherings with Friends and Family: Three times a week   Attends Religious Services: More than 4 times per year   Active Member of Clubs or Organizations: No   Attends Archivist Meetings: Never   Marital Status: Married  Human resources officer Violence: Not At Risk   Fear of Current or Ex-Partner: No   Emotionally Abused: No   Physically Abused: No   Sexually Abused: No   Family History:  Family History  Problem Relation Age of Onset   Diabetes Mother    Diabetes Father    CAD Father    Dementia Father    Diabetes Sister    Cancer Maternal Uncle        Prostate   Cancer Cousin        prostate    Review of Systems: Constitutional: Doesn't report fevers, chills or abnormal weight loss Eyes: Doesn't report blurriness of vision Ears, nose,  mouth, throat, and face: Doesn't report sore throat Respiratory: Doesn't report cough, dyspnea or wheezes Cardiovascular: Doesn't report palpitation, chest discomfort  Gastrointestinal:  Doesn't report nausea, constipation, diarrhea GU: Doesn't report incontinence Skin: Doesn't report skin rashes Neurological: Per HPI Musculoskeletal: Doesn't report joint pain Behavioral/Psych: Doesn't report anxiety  Physical Exam: Vitals:   02/14/21 0941  BP: 114/90  Pulse: 95  Resp: 18  Temp: (!) 96 F (35.6 C)  SpO2: 98%   KPS: 60. General: Alert, cooperative, pleasant, in no acute distress Head: Normal EENT: No conjunctival injection or scleral icterus.  Lungs: Resp effort normal Cardiac: Regular rate Abdomen: Non-distended abdomen Skin:  No rashes cyanosis or petechiae. Extremities: No clubbing or edema  Neurologic Exam: Mental Status: Awake, alert, attentive to examiner. Oriented to self and environment. Language is fluent with intact comprehension to simple commands.  Slow processing speed and age-advanced psychomotor slowing. Impaired recall.  Poor insight into nature of disease course. Cranial Nerves: Visual acuity is grossly normal. Visual fields are full. Extra-ocular movements intact. No ptosis. Face is symmetric Motor: Tone and bulk are normal. Power is full in both arms and legs. Reflexes are symmetric, no pathologic reflexes present.  Sensory: Intact to light touch Gait: Deferred   Labs: I have reviewed the data as listed    Component Value Date/Time   NA 139 02/11/2021 1453   NA 139 01/01/2016 1000   K 3.8 02/11/2021 1453   CL 106 02/11/2021 1453   CO2 22 02/11/2021 1453   GLUCOSE 127 (H) 02/11/2021 1453   BUN 17 02/11/2021 1453   BUN 13 01/01/2016 1000   CREATININE 1.50 (H) 02/11/2021 1453   CREATININE 1.20 01/09/2021 1226   CALCIUM 10.0 02/11/2021 1453   PROT 7.3 02/11/2021 1453   PROT 7.2 01/01/2016 1000   ALBUMIN 3.8 02/11/2021 1453   ALBUMIN 4.4 01/01/2016  1000   AST 24 02/11/2021 1453   ALT 34 02/11/2021 1453   ALKPHOS 70 02/11/2021 1453   BILITOT 0.5 02/11/2021 1453   BILITOT 0.4 01/01/2016 1000   GFRNONAA 51 (L) 02/11/2021 1453   GFRNONAA 62 01/09/2021 1226   GFRAA 72 01/09/2021 1226   Lab Results  Component Value Date   WBC 6.7 02/11/2021   NEUTROABS 3.3 02/11/2021   HGB 12.3 (L) 02/11/2021   HCT 37.4 (L) 02/11/2021   MCV 86.8 02/11/2021   PLT 249 02/11/2021    Imaging:  CLINICAL DATA:  Lung cancer with brain metastases. Status post whole brain radiation.   EXAM: MRI HEAD WITHOUT AND WITH CONTRAST   TECHNIQUE: Multiplanar, multiecho pulse sequences of the brain and surrounding structures were obtained without and with intravenous contrast.   CONTRAST:  5m GADAVIST GADOBUTROL 1 MMOL/ML IV SOLN   COMPARISON:  11/14/2020   FINDINGS: Brain: The enhancing lesion anteriorly in the left temporal lobe has decreased in size, now measuring 14 mm (series 19, image 58, previously 19 mm) with decreased edema. A 3 mm focus of enhancement slightly more superiorly and laterally in the left temporal lobe (series 19, image 70), a 6 mm enhancing lesion in the anterior midline of the midbrain (series 19, image 76), and an 11 mm enhancing lesion in the anterior left frontal lobe (series 19, image 82) are all unchanged. No new enhancing brain lesion is identified.   Multiple areas of chronic hemorrhage are again noted in both cerebral hemispheres including chronic hemorrhage associated with the left temporal and left frontal lesions as well as hemosiderin stained encephalomalacia in the parieto-occipital regions bilaterally. There are unchanged small chronic right frontal infarcts. Outside of the left temporal lobe, confluent T2 hyperintensities in the cerebral white matter bilaterally are stable to minimally increased and reflect in part post radiation changes. No midline shift, acute infarct, or extra-axial fluid collection  is identified. 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 There is moderate cerebral atrophy.   Vascular: Major intracranial vascular flow voids are preserved.   Skull and upper cervical spine: No suspicious marrow lesion.   Sinuses/Orbits: Unremarkable orbits. Left maxillary sinus mucous retention cyst. Persistent large right mastoid effusion.   Other: None.   IMPRESSION: 1. Decreased size of left temporal lobe  metastasis with decreased edema. 2. Unchanged size of other lesions. 3. No evidence of new intracranial metastases or acute abnormality.     Electronically Signed   By: Logan Bores M.D.   On: 12/29/2020 17:38   Assessment/Plan Brain metastasis ([place]) [C79.31]  Destiny Maryan Char presents with clinical and radiographic syndrome consistent with chronic encephalopathy, localizing broadly to the supratentorial space.    There is extensive necrosis, leukomalacia and atrophy as a result of multiple large infarcts, burden of brain metastases, and exposure to radiation.  He meets clinical definition of dementia at this time.  Clinical presentation is consistent with the above history, and no further (acute) workup is recommended at this time.  We discussed managing symptoms at home, especially nocturnal agitation, confusion.  Wife is ok with trial of Seroquel 76m HS PRN for agitation/sleep.   We counseled him and his wife on 'brain healthy' lifestyle choices, including safe/gentle aerobic exercise, high fiber and low sugar diet, social engagement, good quality sleep.  We ask that DBebe Literreturn to clinic in 6 months following next brain MRI, or sooner as needed.   We spent twenty additional minutes teaching regarding the natural history, biology, and historical experience in the treatment of neurologic complications of cancer.   We appreciate the opportunity to participate in the care of DSanta Monica   All questions were answered. The patient  knows to call the clinic with any problems, questions or concerns. No barriers to learning were detected.  The total time spent in the encounter was 40 minutes and more than 50% was on counseling and review of test results   ZVentura Sellers MD Medical Director of Neuro-Oncology [facility]at W[place]06/10/22 9:26 AM

## 2021-02-14 NOTE — Progress Notes (Signed)
Pt and caregiver in for evaluation by Dr Mickeal Skinner.  Pt has left sided weakness, pain in legs bilaterally and swelling in left hand and foot.

## 2021-02-24 ENCOUNTER — Ambulatory Visit: Payer: Medicare Other | Admitting: Internal Medicine

## 2021-02-24 ENCOUNTER — Telehealth: Payer: Self-pay

## 2021-02-24 ENCOUNTER — Other Ambulatory Visit: Payer: Medicare Other

## 2021-02-24 NOTE — Progress Notes (Signed)
Chronic Care Management Pharmacy Assistant   Name: Melvin Willis  MRN: [id number] DOB: 03/18/1951  Reason for Encounter:Diabetes Disease State Call.   Recent office visits:  02/05/2021 Dr. Ancil Boozer MD (PCP) - Change Soliqua 22 units daily, only adjust dose every three days if glucose stays below 100 go down by 2 units for 3 days, if above 140 fasting for an average of 3 days go up by 2 units,Stop glipizide Max of 50 units per day 01/09/2021 Dr.Sowles MD (PCP) - Consider taking half of glipizide 5 mg twice daily and increase dose of Soliqua by 2 units every 3 days to keep fasting between 100-140 Taking glipizide and Soliqua can cause hypoglycemia  Recent consult visits:  02/14/2021 DR. Vaslow MD (Oncology)-  trial of Seroquel 14m HS PRN for agitation/sleep. 02/11/2021 Dr. BRogue BussingMD (Oncology) - recommend holding lorlatinib for now.  Also recommend evaluation with neuro oncology, referral to Dr.Vaslow re: confusion/hallucination Hx of lung ca to brain, Started Trazodone 50 mg 01/14/2021 Dr.Brahmanday MD (Oncology) - Continue to HOLD LORLATINIB 100 mg/day, restart on eliquis 2.5 g BID on 01/17/2021 01/[facility] start Kegel exercises 3x10 sets daily to increase control 01/06/2021 SDebroah LoopPA-C (Urology) No medication Change Noted 12/27/2020 Dr. BRogue BussingMD(Oncology) Continue hold Xarelto-given the recent hemorrhagic metastasis- STABLE.  12/27/2020 MPhilis FendtPA (Primary Care) start doxycycline 100 mg Take 1 capsule (100 mg total) by mouth 2 (two) times daily for 10 days 12/27/2020 Dr.Singh MD (Nephrology) start Bactrim DS twice daily x10 days 12/24/2020 Dr.Singh MD (Nephrology) No medication change noted  Hospital visits:  Medication Reconciliation was completed by comparing discharge summary, patient's EMR and Pharmacy list, and upon discussion with patient.  Admitted to the hospital on 01/10/2021  due to BPH with  obstruction/lower UTI. Discharge date was 01/10/2021. [facility] -[facility] Ciprofloxacin 500 mg 2 times daily   Medication Changes at Hospital Discharge: -Changed None ID  Medications Discontinued at Hospital Discharge: -Stopped None ID  Medications that remain the same after Hospital Discharge:??  -All other medications will remain the same.   Admitted to the hospital on 12/27/2020 due to Acute deep vein thrombosis. Discharge date was 01/01/2021. [facility] -[facility]  10 days of ciprofloxacin and amoxicillin and further recommendations of antibiotics will be deferred to urology at time of urological procedure. - Started lorlatinb 100 mg on 01/15/2021 1 tablet daily   Medication Changes at Hospital Discharge: -Changed Eliquis to 2.5 mg twice daily -Changed Tamsulosin 0.4 mg 1 tablet daily  Medications Discontinued at Hospital Discharge: -Stopped None ID  Medications that remain the same after Hospital Discharge:??  -All other medications will remain the same.    Medications: Outpatient Encounter Medications as of 02/24/2021  Medication Sig Note   apixaban (ELIQUIS) 2.5 MG TABS tablet Take 1 tablet (2.5 mg total) by mouth 2 (two) times daily.    atorvastatin (LIPITOR) 40 MG tablet Take 1 tablet (40 mg total) by mouth daily.    blood glucose meter kit and supplies Dispense based on patient and insurance preference (Accuchek Aviva Plus, Accuchek Nano). Use up to four times daily as directed. (FOR ICD-10 E10.9, E11.9).    gabapentin (NEURONTIN) 300 MG capsule Take 1 capsule by mouth at bedtime (Patient taking differently: Take 300 mg by mouth at bedtime.)    Insulin Glargine-Lixisenatide 100-33 UNT-MCG/ML SOPN Inject 15 Units  into the skin daily with lunch. 02/14/2021: Caregiver reports pt doing a sliding scale.     Insulin Pen Needle (NOVOFINE PEN NEEDLE) 32G X 6 MM MISC 1 each by Does not apply route daily. (Patient not taking: Reported on 02/14/2021)    levETIRAcetam (KEPPRA) 500 MG tablet Take 1 tablet by mouth twice daily    lorlatinib (LORBRENA) 100 MG tablet Take 1 tablet (100 mg total) by mouth daily. Swallow tablets whole. Do not chew, crush or split tablets. (Patient not taking: Reported on 02/14/2021)    QUEtiapine (SEROQUEL) 25 MG tablet Take 1 tablet (25 mg total) by mouth at bedtime as needed.    traZODone (DESYREL) 50 MG tablet Take 1 tablet (50 mg total) by mouth at bedtime. (Patient not taking: Reported on 02/14/2021)    No facility-administered encounter medications on file as of 02/24/2021.    Care Gaps: None ID Star Rating Drugs: Atorvastatin 40 mg last filled on 11/09/2020 for 90 day supply at [facility].  Recent Relevant Labs: Lab Results  Component Value Date/Time   HGBA1C 7.7 (A) 02/05/2021 09:01 AM   HGBA1C 9.5 (H) 12/28/2020 05:02 PM   HGBA1C 10.5 (A) 10/31/2020 10:06 AM   HGBA1C 9.4 (H) 07/26/2020 10:14 AM   HGBA1C 9.8 (A) 04/07/2019 11:30 AM   MICROALBUR 50 12/29/2018 09:13 AM   MICROALBUR 20 01/05/2018 10:22 AM    Kidney Function Lab Results  Component Value Date/Time   CREATININE 1.50 (H) 02/11/2021 02:53 PM   CREATININE 1.11 01/14/2021 01:14 PM   CREATININE 1.20 01/09/2021 12:26 PM   CREATININE 1.58 (H) 07/26/2020 10:14 AM   GFRNONAA 51 (L) 02/11/2021 02:53 PM   GFRNONAA 62 01/09/2021 12:26 PM   GFRAA 72 01/09/2021 12:26 PM    Current antihyperglycemic regimen:  Soliqua 2 units daily, only adjust dose every three days if glucose stays below 100 go down by 2 units for 3 days, if above 140 fasting for an average of 3 days go up by 2 units What recent interventions/DTPs have been made to improve glycemic control:  02/05/2021 Dr. Ancil Boozer MD (PCP) - Change Soliqua 22 units daily, only adjust dose every three days if glucose stays below 100 go down by 2 units for  3 days, if above 140 fasting for an average of 3 days go up by 2 units,Stop glipizide Have there been any recent hospitalizations or ED visits since last visit with CPP? No  I have attempted without success to contact this patient by phone three times to do his Diabetes Disease State call. Unable to leave a Voice message for patient to return my call due to mail box full.  Mailbox full 06/20,06/21,06/23  Adherence Review: Is the patient currently on a STATIN medication? Yes Is the patient currently on ACE/ARB medication? No Does the patient have >5 day gap between last estimated fill dates? No    Anderson [place] Clinical Production designer, theatre/television/film [phone]

## 2021-03-09 ENCOUNTER — Other Ambulatory Visit: Payer: Self-pay | Admitting: Internal Medicine

## 2021-03-11 ENCOUNTER — Encounter: Payer: Self-pay | Admitting: Internal Medicine

## 2021-03-12 ENCOUNTER — Encounter: Payer: Self-pay | Admitting: Internal Medicine

## 2021-03-12 ENCOUNTER — Encounter: Payer: Self-pay | Admitting: Urology

## 2021-03-12 ENCOUNTER — Ambulatory Visit (INDEPENDENT_AMBULATORY_CARE_PROVIDER_SITE_OTHER): Payer: Medicare Other | Admitting: Urology

## 2021-03-12 ENCOUNTER — Inpatient Hospital Stay: Payer: Medicare Other | Attending: Internal Medicine

## 2021-03-12 ENCOUNTER — Other Ambulatory Visit: Payer: Self-pay

## 2021-03-12 ENCOUNTER — Inpatient Hospital Stay (HOSPITAL_BASED_OUTPATIENT_CLINIC_OR_DEPARTMENT_OTHER): Payer: Medicare Other | Admitting: Internal Medicine

## 2021-03-12 VITALS — BP 126/82 | HR 80 | Ht 64.0 in | Wt 155.9 lb

## 2021-03-12 DIAGNOSIS — Z79899 Other long term (current) drug therapy: Secondary | ICD-10-CM | POA: Insufficient documentation

## 2021-03-12 DIAGNOSIS — Z993 Dependence on wheelchair: Secondary | ICD-10-CM | POA: Insufficient documentation

## 2021-03-12 DIAGNOSIS — N183 Chronic kidney disease, stage 3 unspecified: Secondary | ICD-10-CM | POA: Diagnosis not present

## 2021-03-12 DIAGNOSIS — C7931 Secondary malignant neoplasm of brain: Secondary | ICD-10-CM | POA: Insufficient documentation

## 2021-03-12 DIAGNOSIS — I82402 Acute embolism and thrombosis of unspecified deep veins of left lower extremity: Secondary | ICD-10-CM | POA: Diagnosis not present

## 2021-03-12 DIAGNOSIS — N138 Other obstructive and reflux uropathy: Secondary | ICD-10-CM | POA: Diagnosis not present

## 2021-03-12 DIAGNOSIS — C3412 Malignant neoplasm of upper lobe, left bronchus or lung: Secondary | ICD-10-CM | POA: Diagnosis not present

## 2021-03-12 DIAGNOSIS — G620 Drug-induced polyneuropathy: Secondary | ICD-10-CM | POA: Insufficient documentation

## 2021-03-12 DIAGNOSIS — Z7901 Long term (current) use of anticoagulants: Secondary | ICD-10-CM | POA: Diagnosis not present

## 2021-03-12 DIAGNOSIS — I2699 Other pulmonary embolism without acute cor pulmonale: Secondary | ICD-10-CM | POA: Insufficient documentation

## 2021-03-12 DIAGNOSIS — N401 Enlarged prostate with lower urinary tract symptoms: Secondary | ICD-10-CM

## 2021-03-12 LAB — LIPID PANEL
Cholesterol: 117 mg/dL (ref 0–200)
HDL: 41 mg/dL (ref >40)
LDL Cholesterol: 62 mg/dL (ref 0–99)
Total CHOL/HDL Ratio: 2.9 RATIO
Triglycerides: 71 mg/dL (ref <150)
VLDL: 14 mg/dL (ref 0–40)

## 2021-03-12 LAB — CBC WITH DIFFERENTIAL/PLATELET
Abs Immature Granulocytes: 0.01 10*3/uL (ref 0.00–0.07)
Basophils Absolute: 0 10*3/uL (ref 0.0–0.1)
Basophils Relative: 1 %
Eosinophils Absolute: 0.1 10*3/uL (ref 0.0–0.5)
Eosinophils Relative: 3 %
HCT: 38 % — ABNORMAL LOW (ref 39.0–52.0)
Hemoglobin: 12.3 g/dL — ABNORMAL LOW (ref 13.0–17.0)
Immature Granulocytes: 0 %
Lymphocytes Relative: 40 %
Lymphs Abs: 1.6 10*3/uL (ref 0.7–4.0)
MCH: 27 pg (ref 26.0–34.0)
MCHC: 32.4 g/dL (ref 30.0–36.0)
MCV: 83.3 fL (ref 80.0–100.0)
Monocytes Absolute: 0.7 10*3/uL (ref 0.1–1.0)
Monocytes Relative: 17 %
Neutro Abs: 1.6 10*3/uL — ABNORMAL LOW (ref 1.7–7.7)
Neutrophils Relative %: 39 %
Platelets: 230 10*3/uL (ref 150–400)
RBC: 4.56 MIL/uL (ref 4.22–5.81)
RDW: 13.7 % (ref 11.5–15.5)
WBC: 4.1 10*3/uL (ref 4.0–10.5)
nRBC: 0 % (ref 0.0–0.2)

## 2021-03-12 LAB — COMPREHENSIVE METABOLIC PANEL
ALT: 26 U/L (ref 0–44)
AST: 22 U/L (ref 15–41)
Albumin: 3.7 g/dL (ref 3.5–5.0)
Alkaline Phosphatase: 89 U/L (ref 38–126)
Anion gap: 7 (ref 5–15)
BUN: 13 mg/dL (ref 8–23)
CO2: 26 mmol/L (ref 22–32)
Calcium: 9.5 mg/dL (ref 8.9–10.3)
Chloride: 105 mmol/L (ref 98–111)
Creatinine, Ser: 1.17 mg/dL (ref 0.61–1.24)
GFR, Estimated: >60 mL/min (ref >60)
Glucose, Bld: 106 mg/dL — ABNORMAL HIGH (ref 70–99)
Potassium: 3.9 mmol/L (ref 3.5–5.1)
Sodium: 138 mmol/L (ref 135–145)
Total Bilirubin: 0.6 mg/dL (ref 0.3–1.2)
Total Protein: 7 g/dL (ref 6.5–8.1)

## 2021-03-12 LAB — BLADDER SCAN AMB NON-IMAGING

## 2021-03-12 NOTE — Progress Notes (Signed)
   03/12/2021 9:42 AM   Melvin Willis 1951/06/30 [id number]  Reason for visit: Follow up urinary retention status post HOLEP  HPI: Comorbid 70 year old male with history of metastatic lung cancer to the brain, history of DVT/PE on anticoagulation in the past who developed BPH with Foley dependent urinary retention and failed multiple voiding trials.  PVRs in clinic were elevated to 900 mL.  He underwent an uncomplicated HOLEP on 05/12/3201 with removal of 100 g of benign tissue.  He has been voiding spontaneously with a good stream.  PVR today is normal at 67 mL.  He still having some moderate stress incontinence requiring depends.  We reviewed Kegel exercises and postop expectations.  RTC 6 months PVR  [place], [place] [address] [place], [place] [address] [phone]

## 2021-03-12 NOTE — Patient Instructions (Signed)
Kegel Exercises  Kegel exercises can help strengthen your pelvic floor muscles. The pelvic floor is a group of muscles that support your rectum, small intestine, and bladder. In females, pelvic floor muscles also help support the womb (uterus). These muscles help you control the flow of urine and stool. Kegel exercises are painless and simple, and they do not require any equipment. Your provider may suggest Kegel exercises to: Improve bladder and bowel control. Improve sexual response. Improve weak pelvic floor muscles after surgery to remove the uterus (hysterectomy) or pregnancy (females). Improve weak pelvic floor muscles after prostate gland removal or surgery (males). Kegel exercises involve squeezing your pelvic floor muscles, which are the same muscles you squeeze when you try to stop the flow of urine or keep from passing gas. The exercises can be done while sitting, standing, or lying down, but itis best to vary your position. Exercises How to do Kegel exercises: Squeeze your pelvic floor muscles tight. You should feel a tight lift in your rectal area. If you are a male, you should also feel a tightness in your vaginal area. Keep your stomach, buttocks, and legs relaxed. Hold the muscles tight for up to 10 seconds. Breathe normally. Relax your muscles. Repeat as told by your health care provider. Repeat this exercise daily as told by your health care provider. Continue to do this exercise for at least 4-6 weeks, or for as long as told by your healthcare provider. You may be referred to a physical therapist who can help you learn more abouthow to do Kegel exercises. Depending on your condition, your health care provider may recommend: Varying how long you squeeze your muscles. Doing several sets of exercises every day. Doing exercises for several weeks. Making Kegel exercises a part of your regular exercise routine. This information is not intended to replace advice given to you by  your health care provider. Make sure you discuss any questions you have with your healthcare provider. Document Revised: 08/14/2020 Document Reviewed: 04/13/2018 Elsevier Patient Education  [place].

## 2021-03-12 NOTE — Assessment & Plan Note (Addendum)
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   #Patient currently off lorlatinib because of poor tolerance [as per family patient-delirious to point that he had to be locked in his room].  He has been off lorlatinib for a month.  As per family he is back to baseline.  Continue monitoring of treatment.  Patient has MRI planned-December 2022 [Dr.Vaslow]  #Brain metastases - [facility] 2022]progressive disease in the brain-status post whole brain radiation; MRI brain- May 24th- improved.  STABLE; plan as above.   # Bilateral PE & left lower extremity DVT:s/p IVC filter; currently on eliquis 2.5 g BID [5/13]; STABLE  # CKD stage III-creatinine 1.5 [GFR~51]- STABLE  # Peripheral Neuropathy- S stable? DM;continue  gabapentin 200 mg qhs-  I spoke at length with the patient's wife daughter over the phone- regarding the patient's clinical status/plan of care.  Family agreement.   # DISPOSITION:  # follow up in 2 months-MD; labs cbc/cmp;-Dr.B  Cc; Dr.Sowles.

## 2021-03-12 NOTE — Progress Notes (Signed)
[place] OFFICE PROGRESS NOTE  Patient Care Team: [name], [name], [name], [name], [facility] (Pharmacist)  Cancer Staging No matching staging information was found for the patient.   Oncology History Overview Note  # OCT 2017- ADENO CA LUNG; STAGE IV [; LUL; bil supraclavicular LN; Left neck LN Bx]; ROS-1 MUTATED; s/p Carbo-alimta x1[oct 2017]  # NOV 1st 2017- XALKORI 250 mg BID; JAN 15th CT- PR;  # AUG 27th Chemo-RT to persistent LUL/mediastinal LN [s/p carbo-taxol- with RT; finished Sep 22nd 2018];   # July 14th 2020- multiple metastatic lesions of the brain [July 30 whole brain radiation finished April 21, 2019]; bone scan skull metastases /posterior left ninth rib metastases; April 04, 2019 stopped crizotinib;  #April 25, 2019-start Entrectinib 600 mg once a day; STopped in NOV 2020 [dizziness]; NOV 2020-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  # DEC 1st 2020- start avastin q 2w; x6; MRI October 30, 2019-significantly improved left temporal lesion subcentimeter for stable lesions.;  [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   #June 2022-stopped lorlatinib [severe delirium/aggression]; currently on surveillance.  In the interim patient also underwent-HOLEP on May 6.   # LLE DVT/bil PE/Multiple strokes [? On xarelto]-Lovenox; Jan mid  2018- xarelto [lovenox-insurance issues]; STOPPPED MARCH 2022- hemorraghic metasssaes  # MRI brain- multiple infarcts [2d echo/bubble study-NEG's/p Neurology eval]  ------------------------------------------------------------    # # s/p TURP [Sep 2017; Dr.Cope] DEC 26th CT- distended bladder;  # Elevated PSA-  FEB 2021- 12;  S/p Bx- Negative; follow with urology/Dr.Stoioff   # MOLECULAR TESTING- ROS-1 POSITIVE; ALK/EGFR-NEG; PDL-1 EXPRESSION- 90%** [HIGH]  # DEC 15th 2020- PALLIATIVE CARE -----------------------------------------------------------------    DIAGNOSIS: Adenocarcinoma the lung  ROS-1 +  STAGE:  IV    ;GOALS: Palliative  CURRENT/MOST RECENT THERAPY- Avastin [C]   Primary cancer of left upper lobe of lung ([place])  08/09/2019 -  Chemotherapy   The patient had bevacizumab-awwb (MVASI) 800 mg in sodium chloride 0.9 % 100 mL chemo infusion, 725 mg, Intravenous,  Once, 6 of 6 cycles Dose modification: 10 mg/kg (original dose 10 mg/kg, Cycle 3, Reason: Other (see comments), Comment: weight change) Administration: 800 mg (08/09/2019), 800 mg (08/22/2019), 700 mg (09/19/2019), 700 mg (10/04/2019), 700 mg (10/18/2019), 700 mg (11/01/2019)   for chemotherapy treatment.         INTERVAL HISTORY:  Melvin Willis 70 y.o.  male pleasant patient above history of metastatic lung cancer-to brain Ros-1 positive-most recently progressed in the brain s/p whole brain radiation-currently on on surveillance is here for follow-up.  Patient was taken off lorlatinib because of poor tolerance delirium/agitation/aggression approximately a month ago.  As per the family patient is back to baseline health.  Denies any headaches.  Denies any episodes of confusion.  He continues to walk with a cane.  Back on Eliquis.  No falls.  Review of Systems  Constitutional:  Positive for malaise/fatigue. Negative for chills, diaphoresis, fever and weight  loss.  HENT:  Negative for nosebleeds and sore throat.    Eyes:  Negative for double vision.  Respiratory:  Negative for cough, hemoptysis, sputum production, shortness of breath and wheezing.   Cardiovascular:  Negative for chest pain (Left upper chest wall pain chronic), palpitations and orthopnea.  Gastrointestinal:  Negative for abdominal pain, blood in stool, constipation, diarrhea, heartburn and melena.  Genitourinary:  Negative for dysuria.  Musculoskeletal:  Positive for joint pain. Negative for back pain.  Skin: Negative.  Negative for itching and rash.  Neurological:  Positive for tingling. Negative for focal weakness and weakness.  Endo/Heme/Allergies:  Does not bruise/bleed easily.  Psychiatric/Behavioral:  Positive for memory loss. Negative for depression. The patient is not nervous/anxious and does not have insomnia.    PAST MEDICAL HISTORY :  Past Medical History:  Diagnosis Date   Abnormal prostate specific antigen 08/08/2012   Adiposity 04/16/2015   Chronic kidney disease (CKD), stage III (moderate) ([place]) 11/27/2016   CVA (cerebral vascular accident) ([place]) 06/17/2016   Diabetes mellitus without complication ([place])    Diverticulosis of sigmoid colon 04/16/2015   Dyslipidemia 03/18/2015   Hemorrhoids, internal 04/16/2015   Hypercholesteremia 04/16/2015   Hyperlipidemia    Hypertension    Primary cancer of left upper lobe of lung ([place])    Pulmonary embolism ([place])    Wears dentures    partial upper    PAST SURGICAL HISTORY :   Past Surgical History:  Procedure Laterality Date   COLONOSCOPY     COLONOSCOPY WITH PROPOFOL N/A 05/06/2015   Procedure: COLONOSCOPY WITH PROPOFOL;  Surgeon: Lucilla Lame, MD;  Location: [place];  Service: Endoscopy;  Laterality: N/A;  ASCENDING COLON POLYPS X 2 TERMINAL ILEUM BIOPSY RANDOM COLON BX. TRANSVERSE COLON POLYP SIGMOID COLON POLYP   ESOPHAGOGASTRODUODENOSCOPY (EGD) WITH PROPOFOL N/A 05/06/2015   Procedure: ESOPHAGOGASTRODUODENOSCOPY (EGD) WITH PROPOFOL;  Surgeon: Lucilla Lame, MD;   Location: [place];  Service: Endoscopy;  Laterality: N/A;  GASTRIC BIOPSY X1   HOLEP-LASER ENUCLEATION OF THE PROSTATE WITH MORCELLATION N/A 01/10/2021   Procedure: HOLEP-LASER ENUCLEATION OF THE PROSTATE WITH MORCELLATION;  Surgeon: Billey Co, MD;  Location: [facility];  Service: Urology;  Laterality: N/A;   IVC FILTER INSERTION N/A 12/29/2020   Procedure: IVC FILTER INSERTION;  Surgeon: Dwana Curd, MD;  Location: [place] [facility];  Service: Cardiovascular;  Laterality: N/A;   KIDNEY STONE SURGERY  2017    FAMILY HISTORY :   Family History  Problem Relation Age of Onset   Diabetes Mother    Diabetes Father    CAD Father    Dementia Father    Diabetes Sister    Cancer Maternal Uncle        Prostate   Cancer Cousin        prostate    SOCIAL HISTORY:   Social History   Tobacco Use   Smoking status: Never   Smokeless tobacco: Never  Vaping Use   Vaping Use: Never used  Substance Use Topics   Alcohol use: No    Alcohol/week: 0.0 standard drinks   Drug use: No    ALLERGIES:  has No Known Allergies.  MEDICATIONS:  Current Outpatient Medications  Medication Sig Dispense Refill   apixaban (ELIQUIS) 2.5 MG TABS tablet Take 1 tablet (2.5 mg total) by mouth 2 (two) times daily. 60 tablet 1   atorvastatin (LIPITOR) 40 MG tablet Take 1 tablet (40 mg total) by mouth daily. 90 tablet 1   blood  glucose meter kit and supplies Dispense based on patient and insurance preference (Accuchek Aviva Plus, Accuchek Nano). Use up to four times daily as directed. (FOR ICD-10 E10.9, E11.9). 1 each 0   gabapentin (NEURONTIN) 300 MG capsule Take 1 capsule by mouth at bedtime (Patient taking differently: Take 300 mg by mouth at bedtime.) 90 capsule 1   Insulin Glargine-Lixisenatide 100-33 UNT-MCG/ML SOPN Inject 15 Units into the skin daily with lunch.     Insulin Pen Needle (NOVOFINE PEN NEEDLE) 32G X 6 MM MISC 1 each by Does not apply route daily. 100 each 2   levETIRAcetam  (KEPPRA) 500 MG tablet Take 1 tablet by mouth twice daily 60 tablet 3   No current facility-administered medications for this visit.    PHYSICAL EXAMINATION: ECOG PERFORMANCE STATUS: 0 - Asymptomatic  BP 138/80   Pulse 67   Temp 98.6 F (37 C) (Oral)   Resp 18   Ht '5\' 4"'  (1.626 m)   Wt 155 lb (70.3 kg)   BMI 26.61 kg/m   Filed Weights   03/12/21 1515  Weight: 155 lb (70.3 kg)    Physical Exam Constitutional:      Comments: Patient is accompanied by his wife.  He is in a wheelchair.  HENT:     Head: Normocephalic and atraumatic.     Mouth/Throat:     Pharynx: No oropharyngeal exudate.  Eyes:     Pupils: Pupils are equal, round, and reactive to light.  Cardiovascular:     Rate and Rhythm: Normal rate and regular rhythm.  Pulmonary:     Effort: No respiratory distress.     Breath sounds: No wheezing.     Comments: Decreased breath sounds bilaterally no wheeze or crackles. Abdominal:     General: Bowel sounds are normal. There is no distension.     Palpations: Abdomen is soft. There is no mass.     Tenderness: There is no abdominal tenderness. There is no guarding or rebound.  Musculoskeletal:        General: No tenderness. Normal range of motion.     Cervical back: Normal range of motion and neck supple.  Skin:    General: Skin is warm.  Neurological:     Mental Status: He is alert and oriented to person, place, and time.  Psychiatric:        Mood and Affect: Affect normal.   LABORATORY DATA:  I have reviewed the data as listed    Component Value Date/Time   NA 138 03/12/2021 1449   NA 139 01/01/2016 1000   K 3.9 03/12/2021 1449   CL 105 03/12/2021 1449   CO2 26 03/12/2021 1449   GLUCOSE 106 (H) 03/12/2021 1449   BUN 13 03/12/2021 1449   BUN 13 01/01/2016 1000   CREATININE 1.17 03/12/2021 1449   CREATININE 1.20 01/09/2021 1226   CALCIUM 9.5 03/12/2021 1449   PROT 7.0 03/12/2021 1449   PROT 7.2 01/01/2016 1000   ALBUMIN 3.7 03/12/2021 1449   ALBUMIN  4.4 01/01/2016 1000   AST 22 03/12/2021 1449   ALT 26 03/12/2021 1449   ALKPHOS 89 03/12/2021 1449   BILITOT 0.6 03/12/2021 1449   BILITOT 0.4 01/01/2016 1000   GFRNONAA >60 03/12/2021 1449   GFRNONAA 62 01/09/2021 1226   GFRAA 72 01/09/2021 1226    No results found for: SPEP, UPEP  Lab Results  Component Value Date   WBC 4.1 03/12/2021   NEUTROABS 1.6 (L) 03/12/2021   HGB 12.3 (L)  03/12/2021   HCT 38.0 (L) 03/12/2021   MCV 83.3 03/12/2021   PLT 230 03/12/2021      Chemistry      Component Value Date/Time   NA 138 03/12/2021 1449   NA 139 01/01/2016 1000   K 3.9 03/12/2021 1449   CL 105 03/12/2021 1449   CO2 26 03/12/2021 1449   BUN 13 03/12/2021 1449   BUN 13 01/01/2016 1000   CREATININE 1.17 03/12/2021 1449   CREATININE 1.20 01/09/2021 1226      Component Value Date/Time   CALCIUM 9.5 03/12/2021 1449   ALKPHOS 89 03/12/2021 1449   AST 22 03/12/2021 1449   ALT 26 03/12/2021 1449   BILITOT 0.6 03/12/2021 1449   BILITOT 0.4 01/01/2016 1000       RADIOGRAPHIC STUDIES: I have personally reviewed the radiological images as listed and agreed with the findings in the report. No results found.   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   #Patient currently off lorlatinib because of poor tolerance [as per family patient-delirious to point that he had to be locked in his room].  He has been off lorlatinib for a month.  As per family he is back to baseline.  Continue monitoring of treatment.  Patient has MRI planned-December 2022 [Dr.Vaslow]  #Brain metastases - [facility] 2022]progressive disease in the brain-status post whole brain radiation; MRI brain- May 24th- improved.  STABLE; plan as above.   #  Bilateral PE & left lower extremity DVT:s/p IVC filter; currently on eliquis 2.5 g BID [5/13]; STABLE  # CKD stage III-creatinine 1.5 [GFR~51]- STABLE  # Peripheral Neuropathy- S stable? DM;continue  gabapentin 200 mg qhs-  I spoke at length with the patient's wife daughter over the phone- regarding the patient's clinical status/plan of care.  Family agreement.   # DISPOSITION:  # follow up in 2 months-MD; labs cbc/cmp;-Dr.B  Cc; Dr.Sowles.    No orders of the defined types were placed in this encounter.  All questions were answered. The patient knows to call the clinic with any problems, questions or concerns.      Cammie Sickle, MD 03/12/2021 3:50 PM

## 2021-03-13 ENCOUNTER — Encounter: Payer: Self-pay | Admitting: Internal Medicine

## 2021-03-13 ENCOUNTER — Other Ambulatory Visit: Payer: Self-pay | Admitting: Internal Medicine

## 2021-03-13 ENCOUNTER — Other Ambulatory Visit: Payer: Self-pay

## 2021-03-13 DIAGNOSIS — I82403 Acute embolism and thrombosis of unspecified deep veins of lower extremity, bilateral: Secondary | ICD-10-CM

## 2021-03-28 ENCOUNTER — Inpatient Hospital Stay (HOSPITAL_BASED_OUTPATIENT_CLINIC_OR_DEPARTMENT_OTHER): Payer: Medicare Other | Admitting: Internal Medicine

## 2021-03-28 ENCOUNTER — Inpatient Hospital Stay: Payer: Medicare Other

## 2021-03-28 ENCOUNTER — Encounter: Payer: Self-pay | Admitting: Internal Medicine

## 2021-03-28 DIAGNOSIS — C7931 Secondary malignant neoplasm of brain: Secondary | ICD-10-CM | POA: Diagnosis not present

## 2021-03-28 DIAGNOSIS — C3412 Malignant neoplasm of upper lobe, left bronchus or lung: Secondary | ICD-10-CM | POA: Diagnosis not present

## 2021-03-28 DIAGNOSIS — I2699 Other pulmonary embolism without acute cor pulmonale: Secondary | ICD-10-CM | POA: Diagnosis not present

## 2021-03-28 DIAGNOSIS — N183 Chronic kidney disease, stage 3 unspecified: Secondary | ICD-10-CM | POA: Diagnosis not present

## 2021-03-28 DIAGNOSIS — G620 Drug-induced polyneuropathy: Secondary | ICD-10-CM | POA: Diagnosis not present

## 2021-03-28 DIAGNOSIS — I82402 Acute embolism and thrombosis of unspecified deep veins of left lower extremity: Secondary | ICD-10-CM | POA: Diagnosis not present

## 2021-03-28 LAB — CBC WITH DIFFERENTIAL/PLATELET
Abs Immature Granulocytes: 0 10*3/uL (ref 0.00–0.07)
Basophils Absolute: 0 10*3/uL (ref 0.0–0.1)
Basophils Relative: 1 %
Eosinophils Absolute: 0.1 10*3/uL (ref 0.0–0.5)
Eosinophils Relative: 3 %
HCT: 38.4 % — ABNORMAL LOW (ref 39.0–52.0)
Hemoglobin: 12.3 g/dL — ABNORMAL LOW (ref 13.0–17.0)
Immature Granulocytes: 0 %
Lymphocytes Relative: 41 %
Lymphs Abs: 1.7 10*3/uL (ref 0.7–4.0)
MCH: 26.1 pg (ref 26.0–34.0)
MCHC: 32 g/dL (ref 30.0–36.0)
MCV: 81.5 fL (ref 80.0–100.0)
Monocytes Absolute: 0.7 10*3/uL (ref 0.1–1.0)
Monocytes Relative: 16 %
Neutro Abs: 1.6 10*3/uL — ABNORMAL LOW (ref 1.7–7.7)
Neutrophils Relative %: 39 %
Platelets: 241 10*3/uL (ref 150–400)
RBC: 4.71 MIL/uL (ref 4.22–5.81)
RDW: 14.3 % (ref 11.5–15.5)
WBC: 4.1 10*3/uL (ref 4.0–10.5)
nRBC: 0 % (ref 0.0–0.2)

## 2021-03-28 LAB — COMPREHENSIVE METABOLIC PANEL
ALT: 28 U/L (ref 0–44)
AST: 20 U/L (ref 15–41)
Albumin: 3.8 g/dL (ref 3.5–5.0)
Alkaline Phosphatase: 90 U/L (ref 38–126)
Anion gap: 7 (ref 5–15)
BUN: 11 mg/dL (ref 8–23)
CO2: 24 mmol/L (ref 22–32)
Calcium: 9.2 mg/dL (ref 8.9–10.3)
Chloride: 105 mmol/L (ref 98–111)
Creatinine, Ser: 1.33 mg/dL — ABNORMAL HIGH (ref 0.61–1.24)
GFR, Estimated: 59 mL/min — ABNORMAL LOW (ref >60)
Glucose, Bld: 129 mg/dL — ABNORMAL HIGH (ref 70–99)
Potassium: 3.9 mmol/L (ref 3.5–5.1)
Sodium: 136 mmol/L (ref 135–145)
Total Bilirubin: 0.6 mg/dL (ref 0.3–1.2)
Total Protein: 7.3 g/dL (ref 6.5–8.1)

## 2021-03-28 NOTE — Progress Notes (Signed)
[name], [name], [name], [name], [name]   Oncology History Overview Note  # OCT 2017- ADENO CA LUNG; STAGE IV [; LUL; bil supraclavicular LN; Left neck LN Bx]; ROS-1 MUTATED; s/p Carbo-alimta x1[oct 2017]  # NOV 1st 2017- XALKORI 250 mg BID; JAN 15th CT- PR;  # AUG 27th Chemo-RT to persistent LUL/mediastinal LN [s/p carbo-taxol- with RT; finished Sep 22nd 2018];   # July 14th 2020- multiple metastatic lesions of the brain [July 30 whole brain radiation finished April 21, 2019]; bone scan skull metastases /posterior left ninth rib metastases; April 04, 2019 stopped crizotinib;  #April 25, 2019-start Entrectinib 600 mg once a day; STopped in NOV 2020 [dizziness]; NOV 2020-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  # DEC 1st 2020- start avastin q 2w; x6; MRI October 30, 2019-significantly improved left temporal lesion subcentimeter for stable lesions.;  [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   #June 2022-stopped lorlatinib [severe delirium/aggression]; currently on surveillance.  In the interim patient also underwent-HOLEP on May 6.   # LLE DVT/bil PE/Multiple strokes [? On xarelto]-Lovenox; Jan mid  2018- xarelto [lovenox-insurance issues]; STOPPPED MARCH 2022- hemorraghic metasssaes  # MRI brain- multiple infarcts [2d echo/bubble study-NEG's/p Neurology eval]  ------------------------------------------------------------    # # s/p TURP [Sep 2017; Dr.Cope] DEC 26th CT- distended bladder;  # Elevated PSA-  FEB 2021- 12;  S/p Bx- Negative; follow with urology/Dr.Stoioff   # MOLECULAR TESTING- ROS-1 POSITIVE; ALK/EGFR-NEG; PDL-1 EXPRESSION- 90%** [HIGH]  # DEC 15th 2020- PALLIATIVE CARE -----------------------------------------------------------------    DIAGNOSIS: 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         INTERVAL HISTORY:  Melvin Willis 70 y.o.  male pleasant patient above history of metastatic lung cancer-to brain Ros-1 positive-most recently progressed in the brain s/p whole brain radiation-currently on on surveillance is here for follow-up.  Patient is currently on any therapy because of poor tolerance.  Denies any nausea vomiting.  No headaches.  Denies any chest pain.   Is more stable while walking/no falls.  Is currently on Eliquis.  Review of Systems  Constitutional:  Positive for malaise/fatigue. Negative for chills, diaphoresis, fever and weight loss.  HENT:  Negative for nosebleeds and sore throat.   Eyes:  Negative for double vision.  Respiratory:  Negative for cough, hemoptysis, sputum  production, shortness of breath and wheezing.   Cardiovascular:  Negative for chest pain (Left upper chest wall pain chronic), palpitations and orthopnea.  Gastrointestinal:  Negative for abdominal pain, blood in stool, constipation, diarrhea, heartburn and melena.  Genitourinary:  Negative for dysuria.  Musculoskeletal:  Positive for joint pain. Negative for back pain.  Skin: Negative.  Negative for itching and rash.  Neurological:  Positive for tingling. Negative for focal weakness and weakness.  Endo/Heme/Allergies:  Does not bruise/bleed easily.  Psychiatric/Behavioral:  Positive for memory loss. Negative for depression. The patient is not nervous/anxious and does not have insomnia.    PAST MEDICAL HISTORY :  Past Medical History:  Diagnosis Date   Abnormal prostate specific antigen 08/08/2012   Adiposity 04/16/2015   Chronic kidney disease (CKD), stage III (moderate) ([place]) 11/27/2016   CVA (cerebral vascular accident) ([place]) 06/17/2016   Diabetes mellitus without complication ([place])    Diverticulosis of sigmoid colon 04/16/2015   Dyslipidemia 03/18/2015   Hemorrhoids, internal 04/16/2015   Hypercholesteremia 04/16/2015   Hyperlipidemia    Hypertension    Primary cancer of left upper lobe of lung ([place])    Pulmonary embolism ([place])    Wears dentures    partial upper    PAST SURGICAL HISTORY :   Past Surgical History:  Procedure Laterality Date   COLONOSCOPY     COLONOSCOPY WITH PROPOFOL N/A 05/06/2015   Procedure: COLONOSCOPY WITH PROPOFOL;  Surgeon: Lucilla Lame, MD;  Location: [facility];  Service: Endoscopy;  Laterality: N/A;  ASCENDING COLON POLYPS X 2 TERMINAL ILEUM BIOPSY RANDOM COLON BX. TRANSVERSE COLON POLYP SIGMOID COLON POLYP   ESOPHAGOGASTRODUODENOSCOPY (EGD) WITH PROPOFOL N/A 05/06/2015   Procedure: ESOPHAGOGASTRODUODENOSCOPY (EGD) WITH PROPOFOL;  Surgeon: Lucilla Lame, MD;  Location: [facility];  Service: Endoscopy;  Laterality: N/A;  GASTRIC BIOPSY X1    HOLEP-LASER ENUCLEATION OF THE PROSTATE WITH MORCELLATION N/A 01/10/2021   Procedure: HOLEP-LASER ENUCLEATION OF THE PROSTATE WITH MORCELLATION;  Surgeon: Billey Co, MD;  Location: [facility];  Service: Urology;  Laterality: N/A;   IVC FILTER INSERTION N/A 12/29/2020   Procedure: IVC FILTER INSERTION;  Surgeon: Dwana Curd, MD;  Location: [facility];  Service: Cardiovascular;  Laterality: N/A;   KIDNEY STONE SURGERY  2017    FAMILY HISTORY :   Family History  Problem Relation Age of Onset   Diabetes Mother    Diabetes Father    CAD Father    Dementia Father    Diabetes Sister    Cancer Maternal Uncle        Prostate   Cancer Cousin        prostate    SOCIAL HISTORY:   Social History   Tobacco Use   Smoking status: Never   Smokeless tobacco: Never  Vaping Use   Vaping Use: Never used  Substance Use Topics   Alcohol use: No    Alcohol/week: 0.0 standard drinks   Drug use: No    ALLERGIES:  has No Known Allergies.  MEDICATIONS:  Current Outpatient Medications  Medication Sig Dispense Refill   apixaban (ELIQUIS) 2.5 MG TABS tablet Take 1 tablet by mouth twice daily 60 tablet 3   atorvastatin (LIPITOR) 40 MG tablet Take 1 tablet (40 mg total) by mouth daily. 90 tablet 1   blood glucose meter kit and supplies Dispense based on patient and insurance preference (Accuchek Aviva Plus, Accuchek Nano). Use up  to four times daily as directed. (FOR ICD-10 E10.9, E11.9). 1 each 0   gabapentin (NEURONTIN) 300 MG capsule Take 1 capsule by mouth at bedtime (Patient taking differently: Take 300 mg by mouth at bedtime.) 90 capsule 1   Insulin Glargine-Lixisenatide 100-33 UNT-MCG/ML SOPN Inject 15 Units into the skin daily with lunch.     Insulin Pen Needle (NOVOFINE PEN NEEDLE) 32G X 6 MM MISC 1 each by Does not apply route daily. 100 each 2   levETIRAcetam (KEPPRA) 500 MG tablet Take 1 tablet by mouth twice daily 60 tablet 3   No current facility-administered  medications for this visit.    PHYSICAL EXAMINATION: ECOG PERFORMANCE STATUS: 0 - Asymptomatic  BP 126/84 (BP Location: Left Arm, Patient Position: Sitting, Cuff Size: Normal)   Pulse 77   Temp 98.5 F (36.9 C) (Tympanic)   Resp 16   Ht _0  (1.626 m)   Wt 156 lb (70.8 kg)   SpO2 100%   BMI 26.78 kg/m   Filed Weights   03/28/21 1515  Weight: 156 lb (70.8 kg)    Physical Exam Constitutional:      Comments: Patient is accompanied by his wife.  He is in a wheelchair.  HENT:     Head: Normocephalic and atraumatic.     Mouth/Throat:     Pharynx: No oropharyngeal exudate.  Eyes:     Pupils: Pupils are equal, round, and reactive to light.  Cardiovascular:     Rate and Rhythm: Normal rate and regular rhythm.  Pulmonary:     Effort: No respiratory distress.     Breath sounds: No wheezing.     Comments: Decreased breath sounds bilaterally no wheeze or crackles. Abdominal:     General: Bowel sounds are normal. There is no distension.     Palpations: Abdomen is soft. There is no mass.     Tenderness: no abdominal tenderness There is no guarding or rebound.  Musculoskeletal:        General: No tenderness. Normal range of motion.     Cervical back: Normal range of motion and neck supple.  Skin:    General: Skin is warm.  Neurological:     Mental Status: He is alert and oriented to person, place, and time.  Psychiatric:        Mood and Affect: Affect normal.   LABORATORY DATA:  I have reviewed the data as listed    Component Value Date/Time   NA 136 03/28/2021 1447   NA 139 01/01/2016 1000   K 3.9 03/28/2021 1447   CL 105 03/28/2021 1447   CO2 24 03/28/2021 1447   GLUCOSE 129 (H) 03/28/2021 1447   BUN 11 03/28/2021 1447   BUN 13 01/01/2016 1000   CREATININE 1.33 (H) 03/28/2021 1447   CREATININE 1.20 01/09/2021 1226   CALCIUM 9.2 03/28/2021 1447   PROT 7.3 03/28/2021 1447   PROT 7.2 01/01/2016 1000   ALBUMIN 3.8 03/28/2021 1447   ALBUMIN 4.4 01/01/2016 1000    AST 20 03/28/2021 1447   ALT 28 03/28/2021 1447   ALKPHOS 90 03/28/2021 1447   BILITOT 0.6 03/28/2021 1447   BILITOT 0.4 01/01/2016 1000   GFRNONAA 59 (L) 03/28/2021 1447   GFRNONAA 62 01/09/2021 1226   GFRAA 72 01/09/2021 1226    No results found for: SPEP, UPEP  Lab Results  Component Value Date   WBC 4.1 03/28/2021   NEUTROABS 1.6 (L) 03/28/2021   HGB 12.3 (L) 03/28/2021   HCT 38.4 (  L) 03/28/2021   MCV 81.5 03/28/2021   PLT 241 03/28/2021      Chemistry      Component Value Date/Time   NA 136 03/28/2021 1447   NA 139 01/01/2016 1000   K 3.9 03/28/2021 1447   CL 105 03/28/2021 1447   CO2 24 03/28/2021 1447   BUN 11 03/28/2021 1447   BUN 13 01/01/2016 1000   CREATININE 1.33 (H) 03/28/2021 1447   CREATININE 1.20 01/09/2021 1226      Component Value Date/Time   CALCIUM 9.2 03/28/2021 1447   ALKPHOS 90 03/28/2021 1447   AST 20 03/28/2021 1447   ALT 28 03/28/2021 1447   BILITOT 0.6 03/28/2021 1447   BILITOT 0.4 01/01/2016 1000       RADIOGRAPHIC STUDIES: I have personally reviewed the radiological images as listed and agreed with the findings in the report. No results found.   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   #Patient currently off lorlatinib because of poor tolerance.  As per family he is back to baseline.   Patient has MRI planned-December 2022 [Dr.Vaslow]-discussed that if symptomatically worse would recommend an MRI of the brain sooner.  Follow-up CT scan of the chest given March 2022-CT negative.    # Bilateral PE & left lower extremity DVT:s/p IVC filter; currently on eliquis 2.5 g BID [5/13]-stable  # CKD stage III-creatinine 1.5 [GFR~51]-stable  # Peripheral Neuropathy- S  stable? DM;continue  gabapentin 200 mg qhs-stable  # DISPOSITION:  # follow up in 2 months-MD; labs cbc/cmp;-Dr.B  Cc; Dr.Sowles.    No orders of the defined types were placed in this encounter.  All questions were answered. The patient knows to call the clinic with any problems, questions or concerns.      Cammie Sickle, MD 03/30/2021 4:04 PM

## 2021-03-28 NOTE — Assessment & Plan Note (Addendum)
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   #Patient currently off lorlatinib because of poor tolerance.  As per family he is back to baseline.   Patient has MRI planned-December 2022 [Dr.Vaslow]-discussed that if symptomatically worse would recommend an MRI of the brain sooner.  Follow-up CT scan of the chest given March 2022-CT negative.    # Bilateral PE & left lower extremity DVT:s/p IVC filter; currently on eliquis 2.5 g BID [5/13]-stable  # CKD stage III-creatinine 1.5 [GFR~51]-stable  # Peripheral Neuropathy- S stable? DM;continue  gabapentin 200 mg qhs-stable  # DISPOSITION:  # follow up in 2 months-MD; labs cbc/cmp;-Dr.B  Cc; Dr.Sowles.  

## 2021-03-30 ENCOUNTER — Encounter: Payer: Self-pay | Admitting: Internal Medicine

## 2021-04-10 ENCOUNTER — Telehealth: Payer: Self-pay | Admitting: Family Medicine

## 2021-04-10 NOTE — Telephone Encounter (Signed)
Copied from [place] [phone]. Topic: Medicare AWV >> Apr 10, 2021 12:00 PM Cher Nakai R wrote: Reason for CRM:  No answer unable to leave a message for patient to call back and schedule Medicare Annual Wellness Visit (AWV) in office.   If unable to come into the office for AWV,  please offer to do virtually or by telephone.  Last AWV: 04/18/2020  Please schedule at anytime with [facility].  40 minute appointment  Any questions, please contact me at [phone]

## 2021-04-11 ENCOUNTER — Other Ambulatory Visit: Payer: Medicare Other

## 2021-04-11 ENCOUNTER — Ambulatory Visit: Payer: Medicare Other | Admitting: Internal Medicine

## 2021-04-16 ENCOUNTER — Telehealth: Payer: Self-pay

## 2021-04-16 NOTE — Progress Notes (Signed)
    Chronic Care Management Pharmacy Assistant   Name: Melvin Willis  MRN: [id number] DOB: December 11, 1950  Reason for Encounter:Diabetes Disease State Call   Recent office visits:  No recent Office Visit  Recent consult visits:  03/28/2021 Dr.Brahmanday MD (Oncology) Follow up in 2 months with Oncology 03/12/2021 Dr.Brahmanday MD (Oncology) No medication changes noted 03/12/2021 Dr.Sninsky MD (Urology) Follow Up in 6 months with Urology  Hospital visits:  None in previous 6 months  Medications: Outpatient Encounter Medications as of 04/16/2021  Medication Sig Note   apixaban (ELIQUIS) 2.5 MG TABS tablet Take 1 tablet by mouth twice daily    atorvastatin (LIPITOR) 40 MG tablet Take 1 tablet (40 mg total) by mouth daily.    blood glucose meter kit and supplies Dispense based on patient and insurance preference (Accuchek Aviva Plus, Accuchek Nano). Use up to four times daily as directed. (FOR ICD-10 E10.9, E11.9).    gabapentin (NEURONTIN) 300 MG capsule Take 1 capsule by mouth at bedtime (Patient taking differently: Take 300 mg by mouth at bedtime.)    Insulin Glargine-Lixisenatide 100-33 UNT-MCG/ML SOPN Inject 15 Units into the skin daily with lunch. 02/14/2021: Caregiver reports pt doing a sliding scale.    Insulin Pen Needle (NOVOFINE PEN NEEDLE) 32G X 6 MM MISC 1 each by Does not apply route daily.    levETIRAcetam (KEPPRA) 500 MG tablet Take 1 tablet by mouth twice daily    No facility-administered encounter medications on file as of 04/16/2021.    Care Gaps: Shingrix vaccine Covid-19 Vaccine Influenza Vaccine Star Rating Drugs: Atorvastatin 40 mg last filled on 11/09/2020 for 90 day supply at [facility]. Medication Fill Gaps: Apixaban (ELIQUIS) 2.5 MG 30 day supply last filled 01/03/2021. Gabapentin 300 MG 90 day supply supply last filled 12/03/2020.  Recent Relevant Labs: Lab Results  Component Value Date/Time   HGBA1C 7.7 (A) 02/05/2021 09:01 AM   HGBA1C 9.5  (H) 12/28/2020 05:02 PM   HGBA1C 10.5 (A) 10/31/2020 10:06 AM   HGBA1C 9.4 (H) 07/26/2020 10:14 AM   HGBA1C 9.8 (A) 04/07/2019 11:30 AM   MICROALBUR 50 12/29/2018 09:13 AM   MICROALBUR 20 01/05/2018 10:22 AM    Kidney Function Lab Results  Component Value Date/Time   CREATININE 1.33 (H) 03/28/2021 02:47 PM   CREATININE 1.17 03/12/2021 02:49 PM   CREATININE 1.20 01/09/2021 12:26 PM   CREATININE 1.58 (H) 07/26/2020 10:14 AM   GFRNONAA 59 (L) 03/28/2021 02:47 PM   GFRNONAA 62 01/09/2021 12:26 PM   GFRAA 72 01/09/2021 12:26 PM    Current antihyperglycemic regimen:  Soliqua 2 units daily, only adjust dose every three days if glucose stays below 100 go down by 2 units for 3 days, if above 140 fasting for an average of 3 days go up by 2 units What recent interventions/DTPs have been made to improve glycemic control:  None ID Have there been any recent hospitalizations or ED visits since last visit with CPP? No  Adherence Review: Is the patient currently on a STATIN medication? Yes Is the patient currently on ACE/ARB medication? No Does the patient have >5 day gap between last estimated fill dates? Yes  I have attempted without success to contact this patient by phone three times to do his Diabetes Disease State call.  08/10 Unable to leave a message because patient answer and hung up,Mailbox full 08/15,08/16  [facility] Pharmacist Assistant [phone]

## 2021-04-24 ENCOUNTER — Other Ambulatory Visit: Payer: Self-pay

## 2021-04-24 ENCOUNTER — Ambulatory Visit (INDEPENDENT_AMBULATORY_CARE_PROVIDER_SITE_OTHER): Payer: Medicare Other

## 2021-04-24 VITALS — BP 126/74 | HR 85 | Temp 98.2°F | Resp 16 | Ht 64.0 in | Wt 155.3 lb

## 2021-04-24 DIAGNOSIS — Z0000 Encounter for general adult medical examination without abnormal findings: Secondary | ICD-10-CM

## 2021-04-24 DIAGNOSIS — L729 Follicular cyst of the skin and subcutaneous tissue, unspecified: Secondary | ICD-10-CM

## 2021-04-24 NOTE — Patient Instructions (Signed)
[name] , Thank you for taking time to come for your Medicare Wellness Visit. I appreciate your ongoing commitment to your health goals. Please review the following plan we discussed and let me know if I can assist you in the future.   Screening recommendations/referrals: Colonoscopy: done 05/06/15. Due for repeat screening colonoscopy. Please contact [facility] at [phone] to discuss follow up recommendations.  Recommended yearly ophthalmology/optometry visit for glaucoma screening and checkup Recommended yearly dental visit for hygiene and checkup  Vaccinations: Influenza vaccine: done 07/10/20 Pneumococcal vaccine: done 07/26/20 Tdap vaccine: done 01/18/12 Shingles vaccine: Shingrix discussed. Please contact your pharmacy for coverage information.  Covid-19:  done 10/28/19, 11/19/19 & 07/10/20  Advanced directives: Advance directive discussed with you today. I have provided a copy for you to complete at home and have notarized. Once this is complete please bring a copy in to our office so we can scan it into your chart.   Conditions/risks identified: Recommend increasing physical activity as tolerated  Next appointment: Follow up in one year for your annual wellness visit.   Preventive Care 18 Years and Older, Male Preventive care refers to lifestyle choices and visits with your health care provider that can promote health and wellness. What does preventive care include? A yearly physical exam. This is also called an annual well check. Dental exams once or twice a year. Routine eye exams. Ask your health care provider how often you should have your eyes checked. Personal lifestyle choices, including: Daily care of your teeth and gums. Regular physical activity. Eating a healthy diet. Avoiding tobacco and drug use. Limiting alcohol use. Practicing safe sex. Taking low doses of aspirin every day. Taking vitamin and mineral supplements as recommended by your  health care provider. What happens during an annual well check? The services and screenings done by your health care provider during your annual well check will depend on your age, overall health, lifestyle risk factors, and family history of disease. Counseling  Your health care provider may ask you questions about your: Alcohol use. Tobacco use. Drug use. Emotional well-being. Home and relationship well-being. Sexual activity. Eating habits. History of falls. Memory and ability to understand (cognition). Work and work Statistician. Screening  You may have the following tests or measurements: Height, weight, and BMI. Blood pressure. Lipid and cholesterol levels. These may be checked every 5 years, or more frequently if you are over 74 years old. Skin check. Lung cancer screening. You may have this screening every year starting at age 104 if you have a 30-pack-year history of smoking and currently smoke or have quit within the past 15 years. Fecal occult blood test (FOBT) of the stool. You may have this test every year starting at age 4. Flexible sigmoidoscopy or colonoscopy. You may have a sigmoidoscopy every 5 years or a colonoscopy every 10 years starting at age 63. Prostate cancer screening. Recommendations will vary depending on your family history and other risks. Hepatitis C blood test. Hepatitis B blood test. Sexually transmitted disease (STD) testing. Diabetes screening. This is done by checking your blood sugar (glucose) after you have not eaten for a while (fasting). You may have this done every 1-3 years. Abdominal aortic aneurysm (AAA) screening. You may need this if you are a current or former smoker. Osteoporosis. You may be screened starting at age 51 if you are at high risk. Talk with your health care provider about your test results, treatment options, and if necessary, the need for more tests. Vaccines  Your health care provider may recommend certain vaccines, such  as: Influenza vaccine. This is recommended every year. Tetanus, diphtheria, and acellular pertussis (Tdap, Td) vaccine. You may need a Td booster every 10 years. Zoster vaccine. You may need this after age 19. Pneumococcal 13-valent conjugate (PCV13) vaccine. One dose is recommended after age 51. Pneumococcal polysaccharide (PPSV23) vaccine. One dose is recommended after age 70. Talk to your health care provider about which screenings and vaccines you need and how often you need them. This information is not intended to replace advice given to you by your health care provider. Make sure you discuss any questions you have with your health care provider. Document Released: 09/20/2015 Document Revised: 05/13/2016 Document Reviewed: 06/25/2015 Elsevier Interactive Patient Education  2017 [place] Prevention in the Home Falls can cause injuries. They can happen to people of all ages. There are many things you can do to make your home safe and to help prevent falls. What can I do on the outside of my home? Regularly fix the edges of walkways and driveways and fix any cracks. Remove anything that might make you trip as you walk through a door, such as a raised step or threshold. Trim any bushes or trees on the path to your home. Use bright outdoor lighting. Clear any walking paths of anything that might make someone trip, such as rocks or tools. Regularly check to see if handrails are loose or broken. Make sure that both sides of any steps have handrails. Any raised decks and porches should have guardrails on the edges. Have any leaves, snow, or ice cleared regularly. Use sand or salt on walking paths during winter. Clean up any spills in your garage right away. This includes oil or grease spills. What can I do in the bathroom? Use night lights. Install grab bars by the toilet and in the tub and shower. Do not use towel bars as grab bars. Use non-skid mats or decals in the tub or  shower. If you need to sit down in the shower, use a plastic, non-slip stool. Keep the floor dry. Clean up any water that spills on the floor as soon as it happens. Remove soap buildup in the tub or shower regularly. Attach bath mats securely with double-sided non-slip rug tape. Do not have throw rugs and other things on the floor that can make you trip. What can I do in the bedroom? Use night lights. Make sure that you have a light by your bed that is easy to reach. Do not use any sheets or blankets that are too big for your bed. They should not hang down onto the floor. Have a firm chair that has side arms. You can use this for support while you get dressed. Do not have throw rugs and other things on the floor that can make you trip. What can I do in the kitchen? Clean up any spills right away. Avoid walking on wet floors. Keep items that you use a lot in easy-to-reach places. If you need to reach something above you, use a strong step stool that has a grab bar. Keep electrical cords out of the way. Do not use floor polish or wax that makes floors slippery. If you must use wax, use non-skid floor wax. Do not have throw rugs and other things on the floor that can make you trip. What can I do with my stairs? Do not leave any items on the stairs. Make sure that there are  handrails on both sides of the stairs and use them. Fix handrails that are broken or loose. Make sure that handrails are as long as the stairways. Check any carpeting to make sure that it is firmly attached to the stairs. Fix any carpet that is loose or worn. Avoid having throw rugs at the top or bottom of the stairs. If you do have throw rugs, attach them to the floor with carpet tape. Make sure that you have a light switch at the top of the stairs and the bottom of the stairs. If you do not have them, ask someone to add them for you. What else can I do to help prevent falls? Wear shoes that: Do not have high heels. Have  rubber bottoms. Are comfortable and fit you well. Are closed at the toe. Do not wear sandals. If you use a stepladder: Make sure that it is fully opened. Do not climb a closed stepladder. Make sure that both sides of the stepladder are locked into place. Ask someone to hold it for you, if possible. Clearly mark and make sure that you can see: Any grab bars or handrails. First and last steps. Where the edge of each step is. Use tools that help you move around (mobility aids) if they are needed. These include: Canes. Walkers. Scooters. Crutches. Turn on the lights when you go into a dark area. Replace any light bulbs as soon as they burn out. Set up your furniture so you have a clear path. Avoid moving your furniture around. If any of your floors are uneven, fix them. If there are any pets around you, be aware of where they are. Review your medicines with your doctor. Some medicines can make you feel dizzy. This can increase your chance of falling. Ask your doctor what other things that you can do to help prevent falls. This information is not intended to replace advice given to you by your health care provider. Make sure you discuss any questions you have with your health care provider. Document Released: 06/20/2009 Document Revised: 01/30/2016 Document Reviewed: 09/28/2014 Elsevier Interactive Patient Education  2017 [employer].

## 2021-04-24 NOTE — Progress Notes (Signed)
Subjective:   Melvin Willis is a 70 y.o. male who presents for Medicare Annual/Subsequent preventive examination.  Review of Systems     Cardiac Risk Factors include: advanced age (>23mn, >>76women);dyslipidemia;male gender;diabetes mellitus     Objective:    Today's Vitals   04/24/21 1333 04/24/21 1339  BP: 126/74   Pulse: 85   Resp: 16   Temp: 98.2 F (36.8 C)   TempSrc: Oral   SpO2: 97%   Weight: 155 lb 4.8 oz (70.4 kg)   Height: '5\' 4"'  (1.626 m)   PainSc:  3    Body mass index is 26.66 kg/m.  Advanced Directives 04/24/2021 03/12/2021 02/14/2021 01/10/2021 12/31/2020 12/28/2020 12/28/2020  Does Patient Have a Medical Advance Directive? No Yes Yes Yes Yes Yes Yes  Type of Advance Directive - [place] will [place] will [place] will - [place] will [place] will  Does patient want to make changes to medical advance directive? - No - Patient declined - No - Patient declined - No - Patient declined -  Copy of [place] Chart? - No - copy requested - No - copy requested - No - copy requested -  Would patient like information on creating a medical advance directive? Yes (MAU/Ambulatory/Procedural Areas - Information given) No - Patient declined - - - No - Patient declined -    Current Medications (verified) Outpatient Encounter Medications as of 04/24/2021  Medication Sig   apixaban (ELIQUIS) 2.5 MG TABS tablet Take 1 tablet by mouth twice daily   atorvastatin (LIPITOR) 40 MG tablet Take 1 tablet (40 mg total) by mouth daily.   blood glucose meter kit and supplies Dispense based on patient and insurance preference (Accuchek Aviva Plus, Accuchek Nano). Use up to four times daily as directed. (FOR ICD-10 E10.9, E11.9).   gabapentin (NEURONTIN) 300 MG capsule Take 1 capsule by mouth at bedtime (Patient taking differently: Take  300 mg by mouth at bedtime.)   Insulin Glargine-Lixisenatide 100-33 UNT-MCG/ML SOPN Inject 15 Units into the skin daily with lunch.   Insulin Pen Needle (NOVOFINE PEN NEEDLE) 32G X 6 MM MISC 1 each by Does not apply route daily.   levETIRAcetam (KEPPRA) 500 MG tablet Take 1 tablet by mouth twice daily   No facility-administered encounter medications on file as of 04/24/2021.    Allergies (verified) Patient has no known allergies.   History: Past Medical History:  Diagnosis Date   Abnormal prostate specific antigen 08/08/2012   Adiposity 04/16/2015   Chronic kidney disease (CKD), stage III (moderate) [place] 11/27/2016   CV[place] (cerebral vascular accident) [place] 06/17[place]2016   Diabetes mellitus without complication ([place]    Diverticulosis of sigmoid colon 04/16/2015   Dyslipidemia 03/18/2015   Hemorrhoids, internal 04/16/2015   Hypercholesteremia 04/16/2015   Hyperlipidemia    Hypertension    Primary cancer of left upper lobe of lung ([place]    Pulmonary embolism ([place]    Wears dentures    partial upper   Past Surgical History:  Procedure Laterality Date   COLONOSCOPY     COLONOSCOPY WITH PROPOFOL N/A 05/06/2015   Procedure: COLONOSCOPY WITH PROPOFOL;  Surgeon: DLucilla Lame MD;  [place]  Service: Endoscopy;  Laterality: N/A;  ASCENDING COLON POLYPS X 2 TERMINAL ILEUM BIOPSY RANDOM COLON BX. TRANSVERSE COLON POLYP SIGMOID COLON POLYP   ESOPHAGOGASTRODUODENOSCOPY (EGD) WITH PROPOFOL N/A 05/06/2015   Procedure: ESOPHAGOGASTRODUODENOSCOPY (EGD) WITH  PROPOFOL;  Surgeon: Lucilla Lame, MD;  Location: [facility];  Service: Endoscopy;  Laterality: N/A;  GASTRIC BIOPSY X1   HOLEP-LASER ENUCLEATION OF THE PROSTATE WITH MORCELLATION N/A 01/10/2021   Procedure: HOLEP-LASER ENUCLEATION OF THE PROSTATE WITH MORCELLATION;  Surgeon: Billey Co, MD;  Location: [facility];  Service: Urology;  Laterality: N/A;   IVC FILTER INSERTION N/A 12/29/2020   Procedure: IVC FILTER INSERTION;   Surgeon: Dwana Curd, MD;  Location: [facility];  Service: Cardiovascular;  Laterality: N/A;   KIDNEY STONE SURGERY  2017   Family History  Problem Relation Age of Onset   Diabetes Mother    Diabetes Father    CAD Father    Dementia Father    Diabetes Sister    Cancer Maternal Uncle        Prostate   Cancer Cousin        prostate   Social History   Socioeconomic History   Marital status: Married    Spouse name: Melissa    Number of children: 3   Years of education: Not on file   Highest education level: Not on file  Occupational History   Occupation: disable     Comment: from CVA and lung cancer  Tobacco Use   Smoking status: Never   Smokeless tobacco: Never   Tobacco comments:    Smoking cessation materials not required  Vaping Use   Vaping Use: Never used  Substance and Sexual Activity   Alcohol use: No    Alcohol/week: 0.0 standard drinks   Drug use: No   Sexual activity: Not Currently    Partners: Female    Birth control/protection: None  Other Topics Concern   Not on file  Social History Narrative   Used to work until 2 years ago when got sick with DVT leg , PE , CVA and found out he had lung cancer. He has medicare now    Social Determinants of Radio broadcast assistant Strain: Low Risk    Difficulty of Paying Living Expenses: Not very hard  Food Insecurity: No Food Insecurity   Worried About Charity fundraiser in the Last Year: Never true   Arboriculturist in the Last Year: Never true  Transportation Needs: No Transportation Needs   Lack of Transportation (Medical): No   Lack of Transportation (Non-Medical): No  Physical Activity: Inactive   Days of Exercise per Week: 0 days   Minutes of Exercise per Session: 0 min  Stress: No Stress Concern Present   Feeling of Stress : Not at all  Social Connections: Moderately Integrated   Frequency of Communication with Friends and Family: More than three times a week   Frequency of Social  Gatherings with Friends and Family: Three times a week   Attends Religious Services: More than 4 times per year   Active Member of Clubs or Organizations: No   Attends Archivist Meetings: Never   Marital Status: Married    Tobacco Counseling Counseling given: Not Answered Tobacco comments: Smoking cessation materials not required   Clinical Intake:  Pre-visit preparation completed: Yes  Pain : 0-10 Pain Score: 3  Pain Type: Acute pain Pain Location: Back Pain Orientation: Right, Left, Lower Pain Descriptors / Indicators: Aching Pain Onset: 1 to 4 weeks ago Pain Frequency: Intermittent     BMI - recorded: 26.66 Nutritional Status: BMI 25 -29 Overweight Nutritional Risks: None Diabetes: Yes CBG done?: No Did pt. bring in  CBG monitor from home?: No  How often do you need to have someone help you when you read instructions, pamphlets, or other written materials from your doctor or pharmacy?: 1 - Never  Nutrition Risk Assessment:  Has the patient had any N/V/D within the last 2 months?  No  Does the patient have any non-healing wounds?  No  Has the patient had any unintentional weight loss or weight gain?  No   Diabetes:  Is the patient diabetic?  Yes  If diabetic, was a CBG obtained today?  No  Did the patient bring in their glucometer from home?  No  How often do you monitor your CBG's? daily.   Financial Strains and Diabetes Management:  Are you having any financial strains with the device, your supplies or your medication? No .  Does the patient want to be seen by Chronic Care Management for management of their diabetes?  No  Would the patient like to be referred to a Nutritionist or for Diabetic Management?  No   Diabetic Exams:  Diabetic Eye Exam: Completed 05/22/20 negative retinopathy.   Diabetic Foot Exam: Completed 07/26/20.   Interpreter Needed?: No  Information entered by :: Clemetine Marker LPN   Activities of Daily Living In your  present state of health, do you have any difficulty performing the following activities: 04/24/2021 02/05/2021  Hearing? N N  Vision? Y N  Difficulty concentrating or making decisions? Tempie Donning  Walking or climbing stairs? N Y  Dressing or bathing? Y Y  Doing errands, shopping? Y Y  Comment pt does not drive Facilities manager and eating ? N -  Using the Toilet? N -  In the past six months, have you accidently leaked urine? Y -  Comment wears depends at night sometimes -  Do you have problems with loss of bowel control? N -  Managing your Medications? Y -  Managing your Finances? Y -  Housekeeping or managing your Housekeeping? Y -  Some recent data might be hidden    Patient Care Team: [name], [name], [name], [name], [facility] (Pharmacist) Ventura Sellers, MD as Consulting Physician (Psychiatry) Billey Co, MD as Consulting Physician (Urology)  Indicate any recent Medical Services you may have received from other than [facility] providers in the past year (date may be approximate).     Assessment:   This is a routine wellness examination for [place].  Hearing/Vision screen Hearing Screening - Comments:: Pt denies hearing difficulty Vision Screening - Comments:: Annual eye exam scheduled [facility]  Dietary issues and exercise activities discussed: Current Exercise Habits: The patient does not participate in regular exercise at present, Exercise limited by: neurologic condition(s)   Goals Addressed             This Visit's Progress    DIET - INCREASE WATER INTAKE       Recommend drinking 6-8 glasses of water per day        Depression Screen PHQ 2/9 Scores 04/24/2021 02/05/2021 01/09/2021 10/31/2020 07/26/2020 04/24/2020 04/18/2020  PHQ - 2 Score 0 0 4 0 0 0 0  PHQ- 9 Score - - 4 - - 0 -    Fall  Risk Fall Risk  04/24/2021 02/05/2021 01/09/2021 10/31/2020 07/26/2020  Falls in the past year? 1 1 0 0 0  Number falls in past yr:  1 1 0 0 0  Injury with Fall? 0 0 0 0 0  Risk for fall due to : History of fall(s) History of fall(s);Impaired balance/gait;Impaired mobility - - -  Follow up Falls prevention discussed Falls evaluation completed - - -    FALL RISK PREVENTION PERTAINING TO THE HOME:  Any stairs in or around the home? Yes  If so, are there any without handrails? No  Home free of loose throw rugs in walkways, pet beds, electrical cords, etc? Yes  Adequate lighting in your home to reduce risk of falls? Yes   ASSISTIVE DEVICES UTILIZED TO PREVENT FALLS:  Life alert? No  Use of a cane, walker or w/c? No  Grab bars in the bathroom? Yes  Shower chair or bench in shower? Yes  Elevated toilet seat or a handicapped toilet? No   TIMED UP AND GO:  Was the test performed? Yes .  Length of time to ambulate 10 feet: 6 sec.   Gait steady and fast without use of assistive device  Cognitive Function: Cognitive status assessed by direct observation. Patient has current cancer associated neurologic deficits. Patient is followed by oncology for ongoing assessment.      6CIT Screen 04/18/2020  What Year? 0 points  What month? 0 points  What time? 0 points  Count back from 20 0 points  Months in reverse 4 points  Repeat phrase 10 points  Total Score 14    Immunizations Immunization History  Administered Date(s) Administered   Influenza, High Dose Seasonal PF 07/10/2020   Influenza,inj,Quad PF,6+ Mos 06/04/2016   Influenza-Unspecified 06/04/2016, 06/17/2019   PFIZER(Purple Top)SARS-COV-2 Vaccination 10/28/2019, 11/19/2019, 07/10/2020   Pneumococcal Conjugate-13 08/22/2018   Pneumococcal Polysaccharide-23 01/18/2012, 07/26/2020   Tdap 01/18/2012   Zoster, Live 04/03/2016    TDAP status: Up to date  Flu Vaccine status: Up to date  Pneumococcal vaccine status: Up to  date  Covid-19 vaccine status: Completed vaccines  Qualifies for Shingles Vaccine? Yes   Zostavax completed Yes   Shingrix Completed?: No.    Education has been provided regarding the importance of this vaccine. Patient has been advised to call insurance company to determine out of pocket expense if they have not yet received this vaccine. Advised may also receive vaccine at local pharmacy or Health Dept. Verbalized acceptance and understanding.  Screening Tests Health Maintenance  Topic Date Due   Zoster Vaccines- Shingrix (1 of 2) Never done   URINE MICROALBUMIN  12/29/2019   COVID-19 Vaccine (4 - Booster for Pfizer series) 10/10/2020   INFLUENZA VACCINE  04/07/2021   OPHTHALMOLOGY EXAM  05/22/2021   FOOT EXAM  07/26/2021   HEMOGLOBIN A1C  08/07/2021   TETANUS/TDAP  01/17/2022   Hepatitis C Screening  Completed   PNA vac Low Risk Adult  Completed   HPV VACCINES  Aged Out    Health Maintenance  Health Maintenance Due  Topic Date Due   Zoster Vaccines- Shingrix (1 of 2) Never done   URINE MICROALBUMIN  12/29/2019   COVID-19 Vaccine (4 - Booster for Pfizer series) 10/10/2020   INFLUENZA VACCINE  04/07/2021    Colorectal cancer screening: Type of screening: Colonoscopy. Completed 05/06/15. Repeat every 5 years. Pt advised to contact [facility] for follow up and previously scheduled colonoscopy was canceled in 08/2020.   Lung Cancer Screening: (Low Dose CT Chest recommended if Age 23-80 years, 30 pack-year currently smoking OR have quit w/in 15years.) does not qualify.   Additional Screening:  Hepatitis C Screening:  does qualify; Completed 11/14/20  Vision Screening: Recommended annual ophthalmology exams for early detection of glaucoma and other disorders of the eye. Is the patient up to date with their annual eye exam?  Yes  Who is the provider or what is the name of the office in which the patient attends annual eye exams? [facility].   Dental  Screening: Recommended annual dental exams for proper oral hygiene  Community Resource Referral / Chronic Care Management: CRR required this visit?  No   CCM required this visit?  No      Plan:     I have personally reviewed and noted the following in the patient's chart:   Medical and social history Use of alcohol, tobacco or illicit drugs  Current medications and supplements including opioid prescriptions. Patient is not currently taking opioid prescriptions. Functional ability and status Nutritional status Physical activity Advanced directives List of other physicians Hospitalizations, surgeries, and ER visits in previous 12 months Vitals Screenings to include cognitive, depression, and falls Referrals and appointments  In addition, I have reviewed and discussed with patient certain preventive protocols, quality metrics, and best practice recommendations. A written personalized care plan for preventive services as well as general preventive health recommendations were provided to patient.     Clemetine Marker, LPN   [id number]   Nurse Notes: pt accompanied to visit today by his wife Lattie Haw. Pt c/o small bump on right upper cheek approx size of pencil eraser that has been there for approx 1 year that is cyst like in appearance. Pt denies pain or itching but due to proximity to right eye it causes discomfort. Referral to dermatology okay per Kathrine Haddock NP based on general appearance looks benign but removal by dermatologist due to size and location.

## 2021-05-07 ENCOUNTER — Other Ambulatory Visit: Payer: Self-pay | Admitting: Family Medicine

## 2021-05-07 NOTE — Telephone Encounter (Signed)
Requested medication (s) are due for refill today - yes  Requested medication (s) are on the active medication list -yes  Future visit scheduled -yes  Last refill: 01/16/21  Notes to clinic: Request RF: historical provider  Requested Prescriptions  Pending Prescriptions Disp Refills   [place] 100-33 UNT-MCG/ML SOPN [Pharmacy Med Name: [name] 100-33 UNT-MCG/ML Subcutaneous Solution Pen-injector] 15 mL 0    Sig: INJECT 15 TO 30 UNITS SUBCUTANEOUSLY ONCE DAILY     Endocrinology: Diabetes - Insulin + GLP-1 Receptor Agonist Combos 2 Failed - 05/07/2021  3:40 PM      Failed - Cr in normal range and within 360 days    Creat  Date Value Ref Range Status  01/09/2021 1.20 0.70 - 1.25 mg/dL Final    Comment:    For patients >67 years of age, the reference limit for Creatinine is approximately 13% higher for people identified as African-American. .    Creatinine, Ser  Date Value Ref Range Status  03/28/2021 1.33 (H) 0.61 - 1.24 mg/dL Final   Creatinine, Urine  Date Value Ref Range Status  03/29/2020 156 mg/dL Final          Passed - HBA1C is between 0 and 7.9 and within 180 days    Hemoglobin A1C  Date Value Ref Range Status  02/05/2021 7.7 (A) 4.0 - 5.6 % Final   HbA1c, POC (controlled diabetic range)  Date Value Ref Range Status  04/07/2019 9.8 (A) 0.0 - 7.0 % Final   Hgb A1c MFr Bld  Date Value Ref Range Status  12/28/2020 9.5 (H) 4.8 - 5.6 % Final    Comment:    (NOTE) Pre diabetes:          5.7%-6.4%  Diabetes:              >6.4%  Glycemic control for   <7.0% adults with diabetes           Passed - AA eGFR in normal range and within 360 days    GFR, Est African American  Date Value Ref Range Status  01/09/2021 72 > OR = 60 mL/min/1.94m Final   GFR, Est Non African American  Date Value Ref Range Status  01/09/2021 62 > OR = 60 mL/min/1.771mFinal   GFR, Estimated  Date Value Ref Range Status  03/28/2021 59 (L) >60 mL/min Final    Comment:     (NOTE) Calculated using the CKD-EPI Creatinine Equation (2021)           Passed - Valid encounter within last 6 months    Recent Outpatient Visits           3 months ago [facility] with [facility]   3 months ago Hospital discharge follow-up   [facility]   6 months ago [facility] type [facility]   9 months ago Controlled type 2 diabetes mellitus with chronic kidney disease, without long-term current use of insulin, unspecified CKD stage ([facility]  [facility]   1 year ago Controlled type 2 diabetes mellitus with chronic kidney disease, without long-term current use of insulin, unspecified CKD stage ([facility]  [facility]in[facility]       Future Appointments  In 1 month Sowles, Drue Stager, MD [facility], [place]   In 5 months Ralene Bathe, MD [place]   In 11 months  [facility], [facility]               Requested Prescriptions  Pending Prescriptions Disp Refills   [place] 100-33 UNT-MCG/ML SOPN [Pharmacy Med Name: [name] 100-33 UNT-MCG/ML Subcutaneous Solution Pen-injector] 15 mL 0    Sig: INJECT 15 TO 30 UNITS SUBCUTANEOUSLY ONCE DAILY     Endocrinology: Diabetes - Insulin + GLP-1 Receptor Agonist Combos 2 Failed - 05/07/2021  3:40 PM      Failed - Cr in normal range and within 360 days    Creat  Date Value Ref Range Status  01/09/2021 1.20 0.70 - 1.25 mg/dL Final    Comment:    For patients >67 years of age, the reference limit for Creatinine is approximately 13% higher for people identified as African-American. .    Creatinine, Ser  Date Value Ref Range Status  03/28/2021 1.33 (H) 0.61 - 1.24 mg/dL Final    Creatinine, Urine  Date Value Ref Range Status  03/29/2020 156 mg/dL Final          Passed - HBA1C is between 0 and 7.9 and within 180 days    Hemoglobin A1C  Date Value Ref Range Status  02/05/2021 7.7 (A) 4.0 - 5.6 % Final   HbA1c, POC (controlled diabetic range)  Date Value Ref Range Status  04/07/2019 9.8 (A) 0.0 - 7.0 % Final   Hgb A1c MFr Bld  Date Value Ref Range Status  12/28/2020 9.5 (H) 4.8 - 5.6 % Final    Comment:    (NOTE) Pre diabetes:          5.7%-6.4%  Diabetes:              >6.4%  Glycemic control for   <7.0% adults with diabetes           Passed - AA eGFR in normal range and within 360 days    GFR, Est African American  Date Value Ref Range Status  01/09/2021 72 > OR = 60 mL/min/1.83m Final   GFR, Est Non African American  Date Value Ref Range Status  01/09/2021 62 > OR = 60 mL/min/1.779mFinal   GFR, Estimated  Date Value Ref Range Status  03/28/2021 59 (L) >60 mL/min Final    Comment:    (NOTE) Calculated using the CKD-EPI Creatinine Equation (2021)           Passed - Valid encounter within last 6 months    Recent Outpatient Visits           3 months ago [facility] with [facility]  [facility]   3 months ago Hospital discharge follow-up   [facility]   6 months ago [facility]controlled type 2 diabetes mellitus with hyperglycemia ([facility]  [facility]   9 months ago Controlled type 2 diabetes mellitus with chronic kidney disease, without long-term current use of insulin, unspecified CKD stage ([facility]  [facility] Stager[facility]   1 year ago Controlled type 2 diabetes mellitus with chronic kidney disease, without long-term current use of insulin, unspecified CKD stage ([facility]  [facility]in[facility]       Future Appointments  In 1 month [name], MD [facility], [place]   In 5 months Ralene Bathe, MD [place]   In 11 months  [facility], [facility]

## 2021-05-19 ENCOUNTER — Telehealth: Payer: Self-pay

## 2021-05-19 NOTE — Progress Notes (Signed)
    Chronic Care Management Pharmacy Assistant   Name: Melvin Willis  MRN: [id number] DOB: [birth year]   Reason for Encounter:Diabetes Disease State Call.   Recent office visits:  04/24/2021 Clemetine Marker LPN (PCP Office) Medicare Wellness completed, Ambulatory referral to Dermatology place  Recent consult visits:  No recent [facility] visits:  None in previous 6 months  Medications: Outpatient Encounter Medications as of 05/19/2021  Medication Sig   apixaban (ELIQUIS) 2.5 MG TABS tablet Take 1 tablet by mouth twice daily   atorvastatin (LIPITOR) 40 MG tablet Take 1 tablet (40 mg total) by mouth daily.   blood glucose meter kit and supplies Dispense based on patient and insurance preference (Accuchek Aviva Plus, Accuchek Nano). Use up to four times daily as directed. (FOR ICD-10 E10.9, E11.9).   gabapentin (NEURONTIN) 300 MG capsule Take 1 capsule by mouth at bedtime (Patient taking differently: Take 300 mg by mouth at bedtime.)   Insulin Pen Needle (NOVOFINE PEN NEEDLE) 32G X 6 MM MISC 1 each by Does not apply route daily.   levETIRAcetam (KEPPRA) 500 MG tablet Take 1 tablet by mouth twice daily   SOLIQUA 100-33 UNT-MCG/ML SOPN INJECT 15 TO 30 UNITS SUBCUTANEOUSLY ONCE DAILY   No facility-administered encounter medications on file as of 05/19/2021.    Care Gaps: Shingrix vaccine Covid-19 Vaccine Influenza Vaccine Star Rating Drugs: Atorvastatin 40 mg last filled on 02/05/2021 for 90 day supply at [facility] Medication Fill Gaps: None ID  Recent Relevant Labs: Lab Results  Component Value Date/Time   HGBA1C 7.7 (A) 02/05/2021 09:01 AM   HGBA1C 9.5 (H) 12/28/2020 05:02 PM   HGBA1C 10.5 (A) 10/31/2020 10:06 AM   HGBA1C 9.4 (H) 07/26/2020 10:14 AM   HGBA1C 9.8 (A) 04/07/2019 11:30 AM   MICROALBUR 50 12/29/2018 09:13 AM   MICROALBUR 20 01/05/2018 10:22 AM    Kidney Function Lab Results  Component Value Date/Time   CREATININE 1.33 (H) 03/28/2021  02:47 PM   CREATININE 1.17 03/12/2021 02:49 PM   CREATININE 1.20 01/09/2021 12:26 PM   CREATININE 1.58 (H) 07/26/2020 10:14 AM   GFRNONAA 59 (L) 03/28/2021 02:47 PM   GFRNONAA 62 01/09/2021 12:26 PM   GFRAA 72 01/09/2021 12:26 PM    Current antihyperglycemic regimen:  Soliqua 2 units daily, only adjust dose every three days if glucose stays below 100 go down by 2 units for 3 days, if above 140 fasting for an average of 3 days go up by 2 units What recent interventions/DTPs have been made to improve glycemic control:  None ID  I have attempted without success to contact this patient by phone three times to do his Diabetes Disease State call. Mailbox Full 09/12,09/14, 09/15.   Adherence Review: Is the patient currently on a STATIN medication? Yes Is the patient currently on ACE/ARB medication? No Does the patient have >5 day gap between last estimated fill dates? No  Patient is schedule for a follow up with his PCP on 06/11/2021.  [place] Pharmacist Assistant [phone]

## 2021-05-27 ENCOUNTER — Other Ambulatory Visit: Payer: Self-pay | Admitting: *Deleted

## 2021-05-27 DIAGNOSIS — C3412 Malignant neoplasm of upper lobe, left bronchus or lung: Secondary | ICD-10-CM

## 2021-05-30 ENCOUNTER — Inpatient Hospital Stay: Payer: Medicare Other

## 2021-05-30 ENCOUNTER — Inpatient Hospital Stay: Payer: Medicare Other | Attending: Internal Medicine | Admitting: Internal Medicine

## 2021-05-30 ENCOUNTER — Encounter: Payer: Self-pay | Admitting: Internal Medicine

## 2021-05-30 DIAGNOSIS — C3412 Malignant neoplasm of upper lobe, left bronchus or lung: Secondary | ICD-10-CM

## 2021-05-30 LAB — CBC WITH DIFFERENTIAL/PLATELET
Abs Immature Granulocytes: 0.01 10*3/uL (ref 0.00–0.07)
Basophils Absolute: 0 10*3/uL (ref 0.0–0.1)
Basophils Relative: 1 %
Eosinophils Absolute: 0.1 10*3/uL (ref 0.0–0.5)
Eosinophils Relative: 2 %
HCT: 42.4 % (ref 39.0–52.0)
Hemoglobin: 13.4 g/dL (ref 13.0–17.0)
Immature Granulocytes: 0 %
Lymphocytes Relative: 37 %
Lymphs Abs: 1.5 10*3/uL (ref 0.7–4.0)
MCH: 24.5 pg — ABNORMAL LOW (ref 26.0–34.0)
MCHC: 31.6 g/dL (ref 30.0–36.0)
MCV: 77.4 fL — ABNORMAL LOW (ref 80.0–100.0)
Monocytes Absolute: 0.6 10*3/uL (ref 0.1–1.0)
Monocytes Relative: 15 %
Neutro Abs: 1.9 10*3/uL (ref 1.7–7.7)
Neutrophils Relative %: 45 %
Platelets: 217 10*3/uL (ref 150–400)
RBC: 5.48 MIL/uL (ref 4.22–5.81)
RDW: 17.4 % — ABNORMAL HIGH (ref 11.5–15.5)
WBC: 4.1 10*3/uL (ref 4.0–10.5)
nRBC: 0 % (ref 0.0–0.2)

## 2021-05-30 LAB — COMPREHENSIVE METABOLIC PANEL
ALT: 58 U/L — ABNORMAL HIGH (ref 0–44)
AST: 34 U/L (ref 15–41)
Albumin: 3.8 g/dL (ref 3.5–5.0)
Alkaline Phosphatase: 100 U/L (ref 38–126)
Anion gap: 7 (ref 5–15)
BUN: 14 mg/dL (ref 8–23)
CO2: 23 mmol/L (ref 22–32)
Calcium: 9 mg/dL (ref 8.9–10.3)
Chloride: 104 mmol/L (ref 98–111)
Creatinine, Ser: 1.17 mg/dL (ref 0.61–1.24)
GFR, Estimated: >60 mL/min (ref >60)
Glucose, Bld: 159 mg/dL — ABNORMAL HIGH (ref 70–99)
Potassium: 4 mmol/L (ref 3.5–5.1)
Sodium: 134 mmol/L — ABNORMAL LOW (ref 135–145)
Total Bilirubin: 0.6 mg/dL (ref 0.3–1.2)
Total Protein: 7.1 g/dL (ref 6.5–8.1)

## 2021-05-30 NOTE — Patient Instructions (Signed)
#  Please call us for instructions of holding the blood thinner prior to any procedures.

## 2021-05-30 NOTE — Progress Notes (Signed)
[place] OFFICE PROGRESS NOTE  Patient Care Team: [name], [name], [name], [name], [facility] (Pharmacist) Ventura Sellers, MD as Consulting Physician (Psychiatry) Billey Co, MD as Consulting Physician (Urology)   Cancer Staging  No matching staging information was found for the patient.   Oncology History Overview Note  # OCT 2017- ADENO CA LUNG; STAGE IV [; LUL; bil supraclavicular LN; Left neck LN Bx]; ROS-1 MUTATED; s/p Carbo-alimta x1[oct 2017]  # NOV 1st 2017- XALKORI 250 mg BID; JAN 15th CT- PR;  # AUG 27th Chemo-RT to persistent LUL/mediastinal LN [s/p carbo-taxol- with RT; finished Sep 22nd 2018];   # July 14th 2020- multiple metastatic lesions of the brain [July 30 whole brain radiation finished April 21, 2019]; bone scan skull metastases /posterior left ninth rib metastases; April 04, 2019 stopped crizotinib;  #April 25, 2019-start Entrectinib 600 mg once a day; STopped in NOV 2020 [dizziness]; NOV 2020-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  # DEC 1st 2020- start avastin q 2w; x6; MRI October 30, 2019-significantly improved left temporal lesion subcentimeter for stable lesions.;  [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   #June 2022-stopped lorlatinib [severe delirium/aggression]; currently on surveillance.  In the  interim patient also underwent-HOLEP on May 6.   # LLE DVT/bil PE/Multiple strokes [? 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  # MRI brain- multiple infarcts [2d echo/bubble study-NEG's/p Neurology eval]  ------------------------------------------------------------    # # s/p TURP [Sep 2017; Dr.Cope] DEC 26th CT- distended bladder;  # Elevated PSA-  FEB 2021- 12;  S/p Bx- Negative; follow with urology/Dr.Stoioff   # MOLECULAR TESTING- ROS-1 POSITIVE; ALK/EGFR-NEG; PDL-1 EXPRESSION- 90%** [HIGH]  # DEC 15th 2020- PALLIATIVE CARE -----------------------------------------------------------------    DIAGNOSIS: 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         INTERVAL HISTORY:  Melvin Willis 70 y.o.  male pleasant patient above history of metastatic lung cancer-to brain Ros-1 positive-most recently progressed in the brain s/p whole brain radiation-currently on on surveillance is here for follow-up.  Patient is currently on any therapy because of poor tolerance.  Denies any nausea vomiting.  No headaches.  Denies any chest pain.   Is more stable while walking/no falls.  Is currently on Eliquis.  Review of Systems  Constitutional:  Positive for malaise/fatigue. Negative for chills,  diaphoresis, fever and weight loss.  HENT:  Negative for  nosebleeds and sore throat.   Eyes:  Negative for double vision.  Respiratory:  Negative for cough, hemoptysis, sputum production, shortness of breath and wheezing.   Cardiovascular:  Negative for chest pain (Left upper chest wall pain chronic), palpitations and orthopnea.  Gastrointestinal:  Negative for abdominal pain, blood in stool, constipation, diarrhea, heartburn and melena.  Genitourinary:  Negative for dysuria.  Musculoskeletal:  Positive for joint pain. Negative for back pain.  Skin: Negative.  Negative for itching and rash.  Neurological:  Positive for tingling. Negative for focal weakness and weakness.  Endo/Heme/Allergies:  Does not bruise/bleed easily.  Psychiatric/Behavioral:  Positive for memory loss. Negative for depression. The patient is not nervous/anxious and does not have insomnia.    PAST MEDICAL HISTORY :  Past Medical History:  Diagnosis Date   Abnormal prostate specific antigen 08/08/2012   Adiposity 04/16/2015   Chronic kidney disease (CKD), stage III (moderate) ([place]) 11/27/2016   CVA (cerebral vascular accident) ([place]) 06/17/2016   Diabetes mellitus without complication ([place])    Diverticulosis of sigmoid colon 04/16/2015   Dyslipidemia 03/18/2015   Hemorrhoids, internal 04/16/2015   Hypercholesteremia 04/16/2015   Hyperlipidemia    Hypertension    Primary cancer of left upper lobe of lung ([place])    Pulmonary embolism ([place])    Wears dentures    partial upper    PAST SURGICAL HISTORY :   Past Surgical History:  Procedure Laterality Date   COLONOSCOPY     COLONOSCOPY WITH PROPOFOL N/A 05/06/2015   Procedure: COLONOSCOPY WITH PROPOFOL;  Surgeon: Lucilla Lame, MD;  Location: [place];  Service: Endoscopy;  Laterality: N/A;  ASCENDING COLON POLYPS X 2 TERMINAL ILEUM BIOPSY RANDOM COLON BX. TRANSVERSE COLON POLYP SIGMOID COLON POLYP   ESOPHAGOGASTRODUODENOSCOPY (EGD) WITH PROPOFOL N/A 05/06/2015   Procedure: ESOPHAGOGASTRODUODENOSCOPY (EGD) WITH PROPOFOL;   Surgeon: Lucilla Lame, MD;  Location: [place];  Service: Endoscopy;  Laterality: N/A;  GASTRIC BIOPSY X1   HOLEP-LASER ENUCLEATION OF THE PROSTATE WITH MORCELLATION N/A 01/10/2021   Procedure: HOLEP-LASER ENUCLEATION OF THE PROSTATE WITH MORCELLATION;  Surgeon: Billey Co, MD;  Location: [facility];  Service: Urology;  Laterality: N/A;   IVC FILTER INSERTION N/A 12/29/2020   Procedure: IVC FILTER INSERTION;  Surgeon: Dwana Curd, MD;  Location: [place] [facility];  Service: Cardiovascular;  Laterality: N/A;   KIDNEY STONE SURGERY  2017    FAMILY HISTORY :   Family History  Problem Relation Age of Onset   Diabetes Mother    Diabetes Father    CAD Father    Dementia Father    Diabetes Sister    Cancer Maternal Uncle        Prostate   Cancer Cousin        prostate    SOCIAL HISTORY:   Social History   Tobacco Use   Smoking status: Never   Smokeless tobacco: Never   Tobacco comments:    Smoking cessation materials not required  Vaping Use   Vaping Use: Never used  Substance Use Topics   Alcohol use: No    Alcohol/week: 0.0 standard drinks   Drug use: No    ALLERGIES:  has No Known Allergies.  MEDICATIONS:  Current Outpatient Medications  Medication Sig Dispense Refill   apixaban (ELIQUIS) 2.5 MG TABS tablet Take 1 tablet by mouth twice daily 60 tablet 3   atorvastatin (LIPITOR) 40 MG tablet Take 1 tablet (40  mg total) by mouth daily. 90 tablet 1   blood glucose meter kit and supplies Dispense based on patient and insurance preference (Accuchek Aviva Plus, Accuchek Nano). Use up to four times daily as directed. (FOR ICD-10 E10.9, E11.9). 1 each 0   Insulin Pen Needle (NOVOFINE PEN NEEDLE) 32G X 6 MM MISC 1 each by Does not apply route daily. 100 each 2   levETIRAcetam (KEPPRA) 500 MG tablet Take 1 tablet by mouth twice daily 60 tablet 3   SOLIQUA 100-33 UNT-MCG/ML SOPN INJECT 15 TO 30 UNITS SUBCUTANEOUSLY ONCE DAILY 15 mL 0   gabapentin (NEURONTIN) 300  MG capsule Take 1 capsule by mouth at bedtime 90 capsule 2   No current facility-administered medications for this visit.    PHYSICAL EXAMINATION: ECOG PERFORMANCE STATUS: 0 - Asymptomatic  BP 123/78 (BP Location: Right Arm, Patient Position: Sitting, Cuff Size: Normal)   Pulse 71   Temp (!) 97.1 F (36.2 C) (Tympanic)   Resp 18   Wt 154 lb 1 oz (69.9 kg)   SpO2 97%   BMI 26.44 kg/m   Filed Weights   05/30/21 1056  Weight: 154 lb 1 oz (69.9 kg)    Physical Exam Constitutional:      Comments: Patient is accompanied by his wife.  He is in a wheelchair.  HENT:     Head: Normocephalic and atraumatic.     Mouth/Throat:     Pharynx: No oropharyngeal exudate.  Eyes:     Pupils: Pupils are equal, round, and reactive to light.  Cardiovascular:     Rate and Rhythm: Normal rate and regular rhythm.  Pulmonary:     Effort: No respiratory distress.     Breath sounds: No wheezing.     Comments: Decreased breath sounds bilaterally no wheeze or crackles. Abdominal:     General: Bowel sounds are normal. There is no distension.     Palpations: Abdomen is soft. There is no mass.     Tenderness: There is no abdominal tenderness. There is no guarding or rebound.  Musculoskeletal:        General: No tenderness. Normal range of motion.     Cervical back: Normal range of motion and neck supple.  Skin:    General: Skin is warm.  Neurological:     Mental Status: He is alert and oriented to person, place, and time.  Psychiatric:        Mood and Affect: Affect normal.   LABORATORY DATA:  I have reviewed the data as listed    Component Value Date/Time   NA 136 07/25/2021 1005   NA 139 01/01/2016 1000   K 4.1 07/25/2021 1005   CL 104 07/25/2021 1005   CO2 25 07/25/2021 1005   GLUCOSE 140 (H) 07/25/2021 1005   BUN 12 07/25/2021 1005   BUN 13 01/01/2016 1000   CREATININE 1.19 07/25/2021 1005   CREATININE 1.20 01/09/2021 1226   CALCIUM 8.8 (L) 07/25/2021 1005   PROT 7.4 07/25/2021  1005   PROT 7.2 01/01/2016 1000   ALBUMIN 3.8 07/25/2021 1005   ALBUMIN 4.4 01/01/2016 1000   AST 29 07/25/2021 1005   ALT 56 (H) 07/25/2021 1005   ALKPHOS 100 07/25/2021 1005   BILITOT 0.5 07/25/2021 1005   BILITOT 0.4 01/01/2016 1000   GFRNONAA >60 07/25/2021 1005   GFRNONAA 62 01/09/2021 1226   GFRAA 72 01/09/2021 1226    No results found for: SPEP, UPEP  Lab Results  Component Value Date  WBC 4.0 07/25/2021   NEUTROABS 1.8 07/25/2021   HGB 15.0 07/25/2021   HCT 47.3 07/25/2021   MCV 80.7 07/25/2021   PLT 233 07/25/2021      Chemistry      Component Value Date/Time   NA 136 07/25/2021 1005   NA 139 01/01/2016 1000   K 4.1 07/25/2021 1005   CL 104 07/25/2021 1005   CO2 25 07/25/2021 1005   BUN 12 07/25/2021 1005   BUN 13 01/01/2016 1000   CREATININE 1.19 07/25/2021 1005   CREATININE 1.20 01/09/2021 1226      Component Value Date/Time   CALCIUM 8.8 (L) 07/25/2021 1005   ALKPHOS 100 07/25/2021 1005   AST 29 07/25/2021 1005   ALT 56 (H) 07/25/2021 1005   BILITOT 0.5 07/25/2021 1005   BILITOT 0.4 01/01/2016 1000       RADIOGRAPHIC STUDIES: I have personally reviewed the radiological images as listed and agreed with the findings in the report. No results found.   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   # Continue off therapy- [off lorlatinib because of poor tolerance]; MRI planned-December 2022 [Dr.Vaslow]-discussed that if symptomatically worse would recommend an MRI of the brain sooner.  # Bilateral PE & left lower extremity DVT:s/p IVC filter; currently on eliquis 2.5 g BID [5/13]-stable  # Procedures: Toe nail extraction [Dr.Fowler]/Dental extractions  [will need to hold eliquis.]-  wife will call for holding Eliquis prior to procedure.   # CKD stage III-creatinine 1.5 [GFR~51]-STABLE.   # Peripheral Neuropathy- S stable? DM;continue  gabapentin 200 mg qhs-stable  # DISPOSITION:  # follow up in 2 months-MD; labs cbc/cmp;-Dr.B  Cc; Dr.Sowles.    Orders Placed This Encounter  Procedures   CBC with Differential    Standing Status:   Future    Number of Occurrences:   1    Standing Expiration Date:   05/30/2022   Comprehensive metabolic panel    Standing Status:   Future    Number of Occurrences:   1    Standing Expiration Date:   05/30/2022    All questions were answered. The patient knows to call the clinic with any problems, questions or concerns.      Cammie Sickle, MD 07/25/2021 10:44 AM

## 2021-05-30 NOTE — Assessment & Plan Note (Signed)
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   # Continue off therapy- [off lorlatinib because of poor tolerance]; MRI planned-December 2022 [Dr.Vaslow]-discussed that if symptomatically worse would recommend an MRI of the brain sooner.  # Bilateral PE & left lower extremity DVT:s/p IVC filter; currently on eliquis 2.5 g BID [5/13]-stable  # Procedures: Toe nail extraction [Dr.Fowler]/Dental extractions  [will need to hold eliquis.]- wife will call for holding Eliquis prior to procedure.   # CKD stage III-creatinine 1.5 [GFR~51]-STABLE.   # Peripheral Neuropathy- S stable? DM;continue  gabapentin 200 mg qhs-stable  # DISPOSITION:  # follow up in 2 months-MD; labs cbc/cmp;-Dr.B  Cc; Dr.Sowles.  

## 2021-06-10 NOTE — Progress Notes (Signed)
Name: Melvin Willis   MRN: [id number]    DOB: [birth year]   Date:06/11/2021       Progress Note  Subjective  Chief Complaint  Follow Up  HPI  DMII: [place] between 20-22  units before lunch and glucose has been well controlled between 130-160's. He is on statin therapy as prescribed and denies side effects, LDL was 79 but since he has other medication problems we will continue current dose to keep LDL below 100, patient and wife are in agreement . He is due for urine micro.    Chronic DVT left leg, history of PE : off Xarelto ( stopped March 22 due to hemorrhagic brain mets) but is now  on Eliquis because after he stopped Xarelto had recurrence of DVT, he also had IVC filter May 24 th, 2022. He denies easy bruising. He was diagnosed with DVT and PE prior to cancer diagnosis back in 2017. 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 Rogue Bussing, he has finished radiation by Dr. Donella Stade. He has baseline sob with activity . Flu shot today, pneumonia is up to date , discussed importance of getting second COVID-19 booster    History of CVA: initially had dizziness , he now has weakness on left side and finished PT, getting better daily, still taking Eliquis low dose and statin therapy. BP is at goal    Atherosclerosis of aorta: he states taking Atorvastatin daily now , denies side effects .Unchanged    CKI stage III: GFR has been between 47-60, going back and forth, he denies pruritis, good urine output ,, he has a history of microalbuminuria and we will recheck urine micro today   BPH and urinary retention : admitted 12/2020 with UTI, he is under the care of Dr. Al Pimple, had a HoLEP procedure done  01/10/2021, he no longer has incontinence, it happened after procedure, however he has noticed some hematuria when he starts to void, per wife Urologist is aware and told  him to drink more water   Patient Active Problem List   Diagnosis Date Noted   Hyperlipidemia associated with type 2 diabetes mellitus ([place]) 02/05/2021   Deep vein thrombosis (DVT) of proximal lower extremity ([place])    Urinary retention 12/29/2020   Leg DVT (deep venous thromboembolism), acute, bilateral ([place]) 12/29/2020   Delirium    Palliative care encounter    Pneumonia due to infectious organism    Supratherapeutic INR 11/14/2020   Acute-on-chronic kidney injury ([place]) 11/14/2020   Brain metastasis ([place]) 09/25/2019   Atherosclerosis of aorta ([place]) 09/04/2019   Primary malignant neoplasm of lung metastatic to other site [facility]) 04/07/2019   Chronic deep vein thrombosis (DVT) of left lower extremity ([place]) 01/05/2018   Chronic kidney disease (CKD), stage III (moderate) ([place]) 11/27/2016   Blurred vision, bilateral 06/23/2016   Primary cancer of left upper lobe of lung ([place]) 06/12/2016   Mediastinal mass    History of nephrolithiasis 03/27/2016   Pharyngeal dysphagia 01/01/2016   Benign neoplasm of ascending colon    Benign neoplasm of sigmoid colon    Benign neoplasm of transverse colon    Diverticulosis of large intestine without diverticulitis    Overweight (BMI 25.0-29.9) 04/16/2015   Type 2 diabetes mellitus with stage 3 chronic kidney disease ([place]) 03/18/2015   Dyslipidemia 03/18/2015   Disorder of male genital organ 08/08/2012  Nodular prostate with urinary obstruction 08/08/2012   Elevated PSA 08/08/2012    Past Surgical History:  Procedure Laterality Date   COLONOSCOPY     COLONOSCOPY WITH PROPOFOL N/A 05/06/2015   Procedure: COLONOSCOPY WITH PROPOFOL;  Surgeon: Lucilla Lame, MD;  Location: [place];  Service: Endoscopy;  Laterality: N/A;  ASCENDING COLON POLYPS X 2 TERMINAL ILEUM BIOPSY RANDOM COLON BX. TRANSVERSE COLON POLYP SIGMOID COLON POLYP   ESOPHAGOGASTRODUODENOSCOPY (EGD) WITH PROPOFOL N/A 05/06/2015   Procedure: ESOPHAGOGASTRODUODENOSCOPY (EGD) WITH  PROPOFOL;  Surgeon: Lucilla Lame, MD;  Location: [place];  Service: Endoscopy;  Laterality: N/A;  GASTRIC BIOPSY X1   HOLEP-LASER ENUCLEATION OF THE PROSTATE WITH MORCELLATION N/A 01/10/2021   Procedure: HOLEP-LASER ENUCLEATION OF THE PROSTATE WITH MORCELLATION;  Surgeon: Billey Co, MD;  Location: [facility];  Service: Urology;  Laterality: N/A;   IVC FILTER INSERTION N/A 12/29/2020   Procedure: IVC FILTER INSERTION;  Surgeon: Dwana Curd, MD;  Location: [facility];  Service: Cardiovascular;  Laterality: N/A;   KIDNEY STONE SURGERY  2017    Family History  Problem Relation Age of Onset   Diabetes Mother    Diabetes Father    CAD Father    Dementia Father    Diabetes Sister    Cancer Maternal Uncle        Prostate   Cancer Cousin        prostate    Social History   Tobacco Use   Smoking status: Never   Smokeless tobacco: Never   Tobacco comments:    Smoking cessation materials not required  Substance Use Topics   Alcohol use: No    Alcohol/week: 0.0 standard drinks     Current Outpatient Medications:    apixaban (ELIQUIS) 2.5 MG TABS tablet, Take 1 tablet by mouth twice daily, Disp: 60 tablet, Rfl: 3   atorvastatin (LIPITOR) 40 MG tablet, Take 1 tablet (40 mg total) by mouth daily., Disp: 90 tablet, Rfl: 1   blood glucose meter kit and supplies, Dispense based on patient and insurance preference (Accuchek Aviva Plus, Accuchek Nano). Use up to four times daily as directed. (FOR ICD-10 E10.9, E11.9)., Disp: 1 each, Rfl: 0   gabapentin (NEURONTIN) 300 MG capsule, Take 1 capsule by mouth at bedtime (Patient taking differently: Take 300 mg by mouth at bedtime.), Disp: 90 capsule, Rfl: 1   Insulin Pen Needle (NOVOFINE PEN NEEDLE) 32G X 6 MM MISC, 1 each by Does not apply route daily., Disp: 100 each, Rfl: 2   levETIRAcetam (KEPPRA) 500 MG tablet, Take 1 tablet by mouth twice daily, Disp: 60 tablet, Rfl: 3   SOLIQUA 100-33 UNT-MCG/ML SOPN, INJECT 15 TO  30 UNITS SUBCUTANEOUSLY ONCE DAILY, Disp: 15 mL, Rfl: 0  No Known Allergies  I personally reviewed active problem list, medication list, allergies, family history, social history, health maintenance with the patient/caregiver today.   ROS  Constitutional: Negative for fever or weight change.  Respiratory: Negative for cough and shortness of breath.   Cardiovascular: Negative for chest pain or palpitations.  Gastrointestinal: Negative for abdominal pain, no bowel changes.  Musculoskeletal: Negative for gait problem or joint swelling.  Skin: Negative for rash.  Neurological: Negative for dizziness or headache.  No other specific complaints in a complete review of systems (except as listed in HPI above).   Objective  Vitals:   06/11/21 0842  BP: 120/76  Pulse: 68  Resp: 16  Temp: 97.8 F (36.6 C)  SpO2: 96%  Weight: 153 lb (69.4 kg)  Height: '5\' 4"'  (1.626 m)    Body mass index is 26.26 kg/m.  Physical Exam  Constitutional: Patient appears well-developed and well-nourished.  No distress.  HEENT: head atraumatic, normocephalic, pupils equal and reactive to light,  neck supple Cardiovascular: Normal rate, regular rhythm and normal heart sounds.  No murmur heard. No BLE edema. Pulmonary/Chest: Effort normal and breath sounds normal. No respiratory distress. Abdominal: Soft.  There is no tenderness. Neurological: grip slightly weaker on left side, but 5/5  Psychiatric: Patient has a normal mood and affect. behavior is normal. Judgment and thought content normal.   Recent Results (from the past 2160 hour(s))  Comprehensive metabolic panel     Status: Abnormal   Collection Time: 03/28/21  2:47 PM  Result Value Ref Range   Sodium 136 135 - 145 mmol/L   Potassium 3.9 3.5 - 5.1 mmol/L   Chloride 105 98 - 111 mmol/L   CO2 24 22 - 32 mmol/L   Glucose, Bld 129 (H) 70 - 99 mg/dL    Comment: Glucose reference range applies only to samples taken after fasting for at least 8 hours.    BUN 11 8 - 23 mg/dL   Creatinine, Ser 1.33 (H) 0.61 - 1.24 mg/dL   Calcium 9.2 8.9 - 10.3 mg/dL   Total Protein 7.3 6.5 - 8.1 g/dL   Albumin 3.8 3.5 - 5.0 g/dL   AST 20 15 - 41 U/L   ALT 28 0 - 44 U/L   Alkaline Phosphatase 90 38 - 126 U/L   Total Bilirubin 0.6 0.3 - 1.2 mg/dL   GFR, Estimated 59 (L) >60 mL/min    Comment: (NOTE) Calculated using the CKD-EPI Creatinine Equation (2021)    Anion gap 7 5 - 15    Comment: Performed at [facility], [address] [place], [place] [address]  CBC with Differential     Status: Abnormal   Collection Time: 03/28/21  2:47 PM  Result Value Ref Range   WBC 4.1 4.0 - 10.5 K/uL   RBC 4.71 4.22 - 5.81 MIL/uL   Hemoglobin 12.3 (L) 13.0 - 17.0 g/dL   HCT 38.4 (L) 39.0 - 52.0 %   MCV 81.5 80.0 - 100.0 fL   MCH 26.1 26.0 - 34.0 pg   MCHC 32.0 30.0 - 36.0 g/dL   RDW 14.3 11.5 - 15.5 %   Platelets 241 150 - 400 K/uL   nRBC 0.0 0.0 - 0.2 %   Neutrophils Relative % 39 %   Neutro Abs 1.6 (L) 1.7 - 7.7 K/uL   Lymphocytes Relative 41 %   Lymphs Abs 1.7 0.7 - 4.0 K/uL   Monocytes Relative 16 %   Monocytes Absolute 0.7 0.1 - 1.0 K/uL   Eosinophils Relative 3 %   Eosinophils Absolute 0.1 0.0 - 0.5 K/uL   Basophils Relative 1 %   Basophils Absolute 0.0 0.0 - 0.1 K/uL   Immature Granulocytes 0 %   Abs Immature Granulocytes 0.00 0.00 - 0.07 K/uL    Comment: Performed at [facility], [address] [place], [place] [address]  Comprehensive metabolic panel     Status: Abnormal   Collection Time: 05/30/21 10:09 AM  Result Value Ref Range   Sodium 134 (L) 135 - 145 mmol/L   Potassium 4.0 3.5 - 5.1 mmol/L   Chloride 104 98 - 111 mmol/L   CO2 23 22 - 32 mmol/L   Glucose, Bld 159 (H) 70 - 99  mg/dL    Comment: Glucose reference range applies only to samples taken after fasting for at least 8 hours.   BUN 14 8 - 23 mg/dL   Creatinine, Ser 1.17 0.61 - 1.24 mg/dL   Calcium 9.0 8.9 - 10.3 mg/dL   Total Protein 7.1 6.5 - 8.1 g/dL    Albumin 3.8 3.5 - 5.0 g/dL   AST 34 15 - 41 U/L   ALT 58 (H) 0 - 44 U/L   Alkaline Phosphatase 100 38 - 126 U/L   Total Bilirubin 0.6 0.3 - 1.2 mg/dL   GFR, Estimated >60 >60 mL/min    Comment: (NOTE) Calculated using the CKD-EPI Creatinine Equation (2021)    Anion gap 7 5 - 15    Comment: Performed at [facility], [address] [place], [place] [address]  CBC with Differential     Status: Abnormal   Collection Time: 05/30/21 10:09 AM  Result Value Ref Range   WBC 4.1 4.0 - 10.5 K/uL   RBC 5.48 4.22 - 5.81 MIL/uL   Hemoglobin 13.4 13.0 - 17.0 g/dL   HCT 42.4 39.0 - 52.0 %   MCV 77.4 (L) 80.0 - 100.0 fL   MCH 24.5 (L) 26.0 - 34.0 pg   MCHC 31.6 30.0 - 36.0 g/dL   RDW 17.4 (H) 11.5 - 15.5 %   Platelets 217 150 - 400 K/uL   nRBC 0.0 0.0 - 0.2 %   Neutrophils Relative % 45 %   Neutro Abs 1.9 1.7 - 7.7 K/uL   Lymphocytes Relative 37 %   Lymphs Abs 1.5 0.7 - 4.0 K/uL   Monocytes Relative 15 %   Monocytes Absolute 0.6 0.1 - 1.0 K/uL   Eosinophils Relative 2 %   Eosinophils Absolute 0.1 0.0 - 0.5 K/uL   Basophils Relative 1 %   Basophils Absolute 0.0 0.0 - 0.1 K/uL   Immature Granulocytes 0 %   Abs Immature Granulocytes 0.01 0.00 - 0.07 K/uL    Comment: Performed at [facility], [place], [place] [address]  POCT HgB A1C     Status: Abnormal   Collection Time: 06/11/21  8:53 AM  Result Value Ref Range   Hemoglobin A1C 7.0 (A) 4.0 - 5.6 %   HbA1c POC (<> result, manual entry)     HbA1c, POC (prediabetic range)     HbA1c, POC (controlled diabetic range)       PHQ2/9: Depression screen [facility] 2/9 06/11/2021 04/24/2021 02/05/2021 01/09/2021 10/31/2020  Decreased Interest 0 0 0 2 0  Down, Depressed, Hopeless 0 0 0 2 0  PHQ - 2 Score 0 0 0 4 0  Altered sleeping - - - 0 -  Tired, decreased energy - - - 0 -  Change in appetite - - - 0 -  Feeling bad or failure about yourself  - - - 0 -  Trouble concentrating - - - 0 -  Moving slowly or fidgety/restless -  - - 0 -  Suicidal thoughts - - - 0 -  PHQ-9 Score - - - 4 -  Difficult doing work/chores - - - - -  Some recent data might be hidden    phq 9 is negative   Fall Risk: Fall Risk  06/11/2021 04/24/2021 02/05/2021 01/09/2021 10/31/2020  Falls in the past year? 0 1 1 0 0  Number falls in past yr: 0 1 1 0 0  Injury with Fall? 0 0 0 0 0  Risk for fall  due to : History of fall(s) History of fall(s) History of fall(s);Impaired balance/gait;Impaired mobility - -  Follow up Falls prevention discussed Falls prevention discussed Falls evaluation completed - -      Functional Status Survey: Is the patient deaf or have difficulty hearing?: No Does the patient have difficulty seeing, even when wearing glasses/contacts?: Yes Does the patient have difficulty concentrating, remembering, or making decisions?: Yes Does the patient have difficulty walking or climbing stairs?: No Does the patient have difficulty dressing or bathing?: Yes Does the patient have difficulty doing errands alone such as visiting a doctor's office or shopping?: Yes    Assessment & Plan  1. Hyperlipidemia associated with type 2 diabetes mellitus (HCC)  - POCT HgB A1C  2. Need for immunization against influenza  - Flu Vaccine QUAD High Dose(Fluad)  3. Stage 3a chronic kidney disease (HCC)  Check urine micro today   4. Hemiparesis affecting left side as late effect of cerebrovascular accident (CVA) ([place])  Stable  5. Primary malignant neoplasm of lung metastatic to other site, unspecified laterality [facility])  Under the care of hematologist   6. Atherosclerosis of aorta (HCC)  Continue statin therapy   7. Chronic deep vein thrombosis (DVT) of left lower extremity, unspecified vein (HCC)  Continue Eliquis  8. Controlled type 2 diabetes mellitus with stage 3 chronic kidney disease, with long-term current use of insulin (HCC)  - POCT UA - Microalbumin

## 2021-06-11 ENCOUNTER — Encounter: Payer: Self-pay | Admitting: Family Medicine

## 2021-06-11 ENCOUNTER — Ambulatory Visit (INDEPENDENT_AMBULATORY_CARE_PROVIDER_SITE_OTHER): Payer: Medicare Other | Admitting: Family Medicine

## 2021-06-11 ENCOUNTER — Other Ambulatory Visit: Payer: Self-pay

## 2021-06-11 VITALS — BP 120/76 | HR 68 | Temp 97.8°F | Resp 16 | Ht 64.0 in | Wt 153.0 lb

## 2021-06-11 DIAGNOSIS — I82502 Chronic embolism and thrombosis of unspecified deep veins of left lower extremity: Secondary | ICD-10-CM

## 2021-06-11 DIAGNOSIS — E1169 Type 2 diabetes mellitus with other specified complication: Secondary | ICD-10-CM

## 2021-06-11 DIAGNOSIS — I700 Atherosclerosis of aorta: Secondary | ICD-10-CM

## 2021-06-11 DIAGNOSIS — Z23 Encounter for immunization: Secondary | ICD-10-CM | POA: Diagnosis not present

## 2021-06-11 DIAGNOSIS — C3490 Malignant neoplasm of unspecified part of unspecified bronchus or lung: Secondary | ICD-10-CM | POA: Diagnosis not present

## 2021-06-11 DIAGNOSIS — E1122 Type 2 diabetes mellitus with diabetic chronic kidney disease: Secondary | ICD-10-CM

## 2021-06-11 DIAGNOSIS — I69354 Hemiplegia and hemiparesis following cerebral infarction affecting left non-dominant side: Secondary | ICD-10-CM

## 2021-06-11 DIAGNOSIS — N183 Chronic kidney disease, stage 3 unspecified: Secondary | ICD-10-CM

## 2021-06-11 DIAGNOSIS — Z794 Long term (current) use of insulin: Secondary | ICD-10-CM | POA: Diagnosis not present

## 2021-06-11 DIAGNOSIS — N1831 Chronic kidney disease, stage 3a: Secondary | ICD-10-CM

## 2021-06-11 DIAGNOSIS — E785 Hyperlipidemia, unspecified: Secondary | ICD-10-CM | POA: Diagnosis not present

## 2021-06-11 LAB — POCT GLYCOSYLATED HEMOGLOBIN (HGB A1C): Hemoglobin A1C: 7 % — AB (ref 4.0–5.6)

## 2021-06-12 LAB — MICROALBUMIN / CREATININE URINE RATIO
Creatinine, Urine: 140 mg/dL (ref 20–320)
Microalb Creat Ratio: 39 mcg/mg creat — ABNORMAL HIGH (ref <30)
Microalb, Ur: 5.4 mg/dL

## 2021-06-21 ENCOUNTER — Other Ambulatory Visit: Payer: Self-pay | Admitting: Internal Medicine

## 2021-06-23 ENCOUNTER — Encounter: Payer: Self-pay | Admitting: Internal Medicine

## 2021-07-17 DIAGNOSIS — Z23 Encounter for immunization: Secondary | ICD-10-CM | POA: Diagnosis not present

## 2021-07-25 ENCOUNTER — Encounter: Payer: Self-pay | Admitting: Internal Medicine

## 2021-07-25 ENCOUNTER — Inpatient Hospital Stay: Payer: Medicare Other | Attending: Internal Medicine

## 2021-07-25 ENCOUNTER — Other Ambulatory Visit: Payer: Self-pay

## 2021-07-25 ENCOUNTER — Inpatient Hospital Stay (HOSPITAL_BASED_OUTPATIENT_CLINIC_OR_DEPARTMENT_OTHER): Payer: Medicare Other | Admitting: Internal Medicine

## 2021-07-25 VITALS — BP 142/76 | HR 62 | Temp 97.2°F | Resp 17 | Wt 154.0 lb

## 2021-07-25 DIAGNOSIS — Z8673 Personal history of transient ischemic attack (TIA), and cerebral infarction without residual deficits: Secondary | ICD-10-CM | POA: Insufficient documentation

## 2021-07-25 DIAGNOSIS — Z7901 Long term (current) use of anticoagulants: Secondary | ICD-10-CM | POA: Insufficient documentation

## 2021-07-25 DIAGNOSIS — Z833 Family history of diabetes mellitus: Secondary | ICD-10-CM | POA: Insufficient documentation

## 2021-07-25 DIAGNOSIS — R5383 Other fatigue: Secondary | ICD-10-CM | POA: Diagnosis not present

## 2021-07-25 DIAGNOSIS — Z79899 Other long term (current) drug therapy: Secondary | ICD-10-CM | POA: Insufficient documentation

## 2021-07-25 DIAGNOSIS — C7931 Secondary malignant neoplasm of brain: Secondary | ICD-10-CM | POA: Diagnosis not present

## 2021-07-25 DIAGNOSIS — Z8042 Family history of malignant neoplasm of prostate: Secondary | ICD-10-CM | POA: Insufficient documentation

## 2021-07-25 DIAGNOSIS — R413 Other amnesia: Secondary | ICD-10-CM | POA: Insufficient documentation

## 2021-07-25 DIAGNOSIS — Z8249 Family history of ischemic heart disease and other diseases of the circulatory system: Secondary | ICD-10-CM | POA: Diagnosis not present

## 2021-07-25 DIAGNOSIS — I82402 Acute embolism and thrombosis of unspecified deep veins of left lower extremity: Secondary | ICD-10-CM | POA: Insufficient documentation

## 2021-07-25 DIAGNOSIS — G629 Polyneuropathy, unspecified: Secondary | ICD-10-CM | POA: Diagnosis not present

## 2021-07-25 DIAGNOSIS — Z86711 Personal history of pulmonary embolism: Secondary | ICD-10-CM | POA: Insufficient documentation

## 2021-07-25 DIAGNOSIS — I129 Hypertensive chronic kidney disease with stage 1 through stage 4 chronic kidney disease, or unspecified chronic kidney disease: Secondary | ICD-10-CM | POA: Insufficient documentation

## 2021-07-25 DIAGNOSIS — Z8719 Personal history of other diseases of the digestive system: Secondary | ICD-10-CM | POA: Insufficient documentation

## 2021-07-25 DIAGNOSIS — C3490 Malignant neoplasm of unspecified part of unspecified bronchus or lung: Secondary | ICD-10-CM | POA: Diagnosis not present

## 2021-07-25 DIAGNOSIS — N183 Chronic kidney disease, stage 3 unspecified: Secondary | ICD-10-CM | POA: Insufficient documentation

## 2021-07-25 DIAGNOSIS — Z923 Personal history of irradiation: Secondary | ICD-10-CM | POA: Insufficient documentation

## 2021-07-25 DIAGNOSIS — Z818 Family history of other mental and behavioral disorders: Secondary | ICD-10-CM | POA: Insufficient documentation

## 2021-07-25 DIAGNOSIS — C7951 Secondary malignant neoplasm of bone: Secondary | ICD-10-CM | POA: Diagnosis not present

## 2021-07-25 DIAGNOSIS — M2550 Pain in unspecified joint: Secondary | ICD-10-CM | POA: Insufficient documentation

## 2021-07-25 DIAGNOSIS — I2699 Other pulmonary embolism without acute cor pulmonale: Secondary | ICD-10-CM | POA: Insufficient documentation

## 2021-07-25 DIAGNOSIS — C3412 Malignant neoplasm of upper lobe, left bronchus or lung: Secondary | ICD-10-CM | POA: Insufficient documentation

## 2021-07-25 DIAGNOSIS — E1122 Type 2 diabetes mellitus with diabetic chronic kidney disease: Secondary | ICD-10-CM | POA: Insufficient documentation

## 2021-07-25 LAB — COMPREHENSIVE METABOLIC PANEL
ALT: 56 U/L — ABNORMAL HIGH (ref 0–44)
AST: 29 U/L (ref 15–41)
Albumin: 3.8 g/dL (ref 3.5–5.0)
Alkaline Phosphatase: 100 U/L (ref 38–126)
Anion gap: 7 (ref 5–15)
BUN: 12 mg/dL (ref 8–23)
CO2: 25 mmol/L (ref 22–32)
Calcium: 8.8 mg/dL — ABNORMAL LOW (ref 8.9–10.3)
Chloride: 104 mmol/L (ref 98–111)
Creatinine, Ser: 1.19 mg/dL (ref 0.61–1.24)
GFR, Estimated: >60 mL/min (ref >60)
Glucose, Bld: 140 mg/dL — ABNORMAL HIGH (ref 70–99)
Potassium: 4.1 mmol/L (ref 3.5–5.1)
Sodium: 136 mmol/L (ref 135–145)
Total Bilirubin: 0.5 mg/dL (ref 0.3–1.2)
Total Protein: 7.4 g/dL (ref 6.5–8.1)

## 2021-07-25 LAB — CBC WITH DIFFERENTIAL/PLATELET
Abs Immature Granulocytes: 0 10*3/uL (ref 0.00–0.07)
Basophils Absolute: 0 10*3/uL (ref 0.0–0.1)
Basophils Relative: 1 %
Eosinophils Absolute: 0.1 10*3/uL (ref 0.0–0.5)
Eosinophils Relative: 2 %
HCT: 47.3 % (ref 39.0–52.0)
Hemoglobin: 15 g/dL (ref 13.0–17.0)
Immature Granulocytes: 0 %
Lymphocytes Relative: 40 %
Lymphs Abs: 1.6 10*3/uL (ref 0.7–4.0)
MCH: 25.6 pg — ABNORMAL LOW (ref 26.0–34.0)
MCHC: 31.7 g/dL (ref 30.0–36.0)
MCV: 80.7 fL (ref 80.0–100.0)
Monocytes Absolute: 0.5 10*3/uL (ref 0.1–1.0)
Monocytes Relative: 12 %
Neutro Abs: 1.8 10*3/uL (ref 1.7–7.7)
Neutrophils Relative %: 45 %
Platelets: 233 10*3/uL (ref 150–400)
RBC: 5.86 MIL/uL — ABNORMAL HIGH (ref 4.22–5.81)
RDW: 17.2 % — ABNORMAL HIGH (ref 11.5–15.5)
WBC: 4 10*3/uL (ref 4.0–10.5)
nRBC: 0 % (ref 0.0–0.2)

## 2021-07-25 NOTE — Assessment & Plan Note (Addendum)
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  Will repeat CT CAP in 2 months/Jan 2023.   #Brain metastases -MRI brain MARCH 2022-progressive disease s/p WBRT- MAY 24th, MRI 2022- Improved. Continue off therapy- [off lorlatinib because of poor tolerance]; MRI planned-December 9th 2022 [Dr.Vaslow]-repeat eval on dec 16th, 2022.   # Bilateral PE & left lower extremity DVT:s/p IVC filter; currently on eliquis 2.5 g BID [5/13]-stable  # Procedures: Toe nail extraction [Dr.Fowler]/Dental extractions  [will need to hold eliquis.]- wife will call for holding Eliquis prior to procedure.   # CKD stage III-creatinine 1.5 [GFR~51]-stable  # Peripheral Neuropathy- S stable? DM;continue  gabapentin 200 mg qhs-stable  # DISPOSITION:  # follow up in 2 months-MD; labs cbc/cmp;CT CAP-Dr.B  Cc; Dr.Sowles.

## 2021-07-25 NOTE — Progress Notes (Signed)
[facility] OFFICE PROGRESS NOTE  Patient Care Team: [name], [name], [name], [name], [facility] (Pharmacist) Ventura Sellers, MD as Consulting Physician (Psychiatry) Billey Co, MD as Consulting Physician (Urology)   Cancer Staging  No matching staging information was found for the patient.   Oncology History Overview Note  # OCT 2017- ADENO CA LUNG; STAGE IV [; LUL; bil supraclavicular LN; Left neck LN Bx]; ROS-1 MUTATED; s/p Carbo-alimta x1[oct 2017]  # NOV 1st 2017- XALKORI 250 mg BID; JAN 15th CT- PR;  # AUG 27th Chemo-RT to persistent LUL/mediastinal LN [s/p carbo-taxol- with RT; finished Sep 22nd 2018];   # July 14th 2020- multiple metastatic lesions of the brain [July 30 whole brain radiation finished April 21, 2019]; bone scan skull metastases /posterior left ninth rib metastases; April 04, 2019 stopped crizotinib;  #April 25, 2019-start Entrectinib 600 mg once a day; STopped in NOV 2020 [dizziness]; NOV 2020-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  # DEC 1st 2020- start avastin q 2w; x6; MRI October 30, 2019-significantly improved left temporal lesion subcentimeter for stable lesions.;  [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   #June 2022-stopped lorlatinib [severe delirium/aggression]; currently on surveillance.  In the  interim patient also underwent-HOLEP on May 6.   # LLE DVT/bil PE/Multiple strokes [? 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  # MRI brain- multiple infarcts [2d echo/bubble study-NEG's/p Neurology eval]  ------------------------------------------------------------    # # s/p TURP [Sep 2017; Dr.Cope] DEC 26th CT- distended bladder;  # Elevated PSA-  FEB 2021- 12;  S/p Bx- Negative; follow with urology/Dr.Stoioff   # MOLECULAR TESTING- ROS-1 POSITIVE; ALK/EGFR-NEG; PDL-1 EXPRESSION- 90%** [HIGH]  # DEC 15th 2020- PALLIATIVE CARE -----------------------------------------------------------------    DIAGNOSIS: 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         INTERVAL HISTORY: Ambulating independently.  Accompanied by his wife.  Baptiste Maryan Char 70 y.o.  male pleasant patient above history of metastatic lung cancer-to brain Ros-1 positive-most recently progressed in the brain s/p whole brain radiation-currently on on surveillance is here for follow-up.   Patient denies any recent falls.  Continues to have mild to moderate memory issues.  Otherwise denies any swelling in the legs.  No shortness of breath or cough.  Denies any headaches.  Denies any nausea vomiting.  He continues  with Eliquis.  Review of Systems  Constitutional:  Positive for  malaise/fatigue. Negative for chills, diaphoresis, fever and weight loss.  HENT:  Negative for nosebleeds and sore throat.   Eyes:  Negative for double vision.  Respiratory:  Negative for cough, hemoptysis, sputum production, shortness of breath and wheezing.   Cardiovascular:  Negative for chest pain (Left upper chest wall pain chronic), palpitations and orthopnea.  Gastrointestinal:  Negative for abdominal pain, blood in stool, constipation, diarrhea, heartburn and melena.  Genitourinary:  Negative for dysuria.  Musculoskeletal:  Positive for joint pain. Negative for back pain.  Skin: Negative.  Negative for itching and rash.  Neurological:  Positive for tingling. Negative for focal weakness and weakness.  Endo/Heme/Allergies:  Does not bruise/bleed easily.  Psychiatric/Behavioral:  Positive for memory loss. Negative for depression. The patient is not nervous/anxious and does not have insomnia.    PAST MEDICAL HISTORY :  Past Medical History:  Diagnosis Date   Abnormal prostate specific antigen 08/08/2012   Adiposity 04/16/2015   Chronic kidney disease (CKD), stage III (moderate) ([place]) 11/27/2016   CVA (cerebral vascular accident) ([place]) 06/17/2016   Diabetes mellitus without complication ([place])    Diverticulosis of sigmoid colon 04/16/2015   Dyslipidemia 03/18/2015   Hemorrhoids, internal 04/16/2015   Hypercholesteremia 04/16/2015   Hyperlipidemia    Hypertension    Primary cancer of left upper lobe of lung ([place])    Pulmonary embolism ([place])    Wears dentures    partial upper    PAST SURGICAL HISTORY :   Past Surgical History:  Procedure Laterality Date   COLONOSCOPY     COLONOSCOPY WITH PROPOFOL N/A 05/06/2015   Procedure: COLONOSCOPY WITH PROPOFOL;  Surgeon: Lucilla Lame, MD;  Location: [facility];  Service: Endoscopy;  Laterality: N/A;  ASCENDING COLON POLYPS X 2 TERMINAL ILEUM BIOPSY RANDOM COLON BX. TRANSVERSE COLON POLYP SIGMOID COLON POLYP   ESOPHAGOGASTRODUODENOSCOPY  (EGD) WITH PROPOFOL N/A 05/06/2015   Procedure: ESOPHAGOGASTRODUODENOSCOPY (EGD) WITH PROPOFOL;  Surgeon: Lucilla Lame, MD;  Location: [facility];  Service: Endoscopy;  Laterality: N/A;  GASTRIC BIOPSY X1   HOLEP-LASER ENUCLEATION OF THE PROSTATE WITH MORCELLATION N/A 01/10/2021   Procedure: HOLEP-LASER ENUCLEATION OF THE PROSTATE WITH MORCELLATION;  Surgeon: Billey Co, MD;  Location: [facility];  Service: Urology;  Laterality: N/A;   IVC FILTER INSERTION N/A 12/29/2020   Procedure: IVC FILTER INSERTION;  Surgeon: Dwana Curd, MD;  Location: [facility];  Service: Cardiovascular;  Laterality: N/A;   KIDNEY STONE SURGERY  2017    FAMILY HISTORY :   Family History  Problem Relation Age of Onset   Diabetes Mother    Diabetes Father    CAD Father    Dementia Father    Diabetes Sister    Cancer Maternal Uncle        Prostate   Cancer Cousin        prostate    SOCIAL HISTORY:   Social History   Tobacco Use   Smoking status: Never   Smokeless tobacco: Never   Tobacco comments:    Smoking cessation materials not required  Vaping Use   Vaping Use: Never used  Substance Use Topics   Alcohol use: No    Alcohol/week: 0.0 standard drinks   Drug use: No    ALLERGIES:  has No Known Allergies.  MEDICATIONS:  Current Outpatient Medications  Medication Sig Dispense Refill   apixaban (ELIQUIS) 2.5 MG TABS tablet Take 1 tablet by mouth twice  daily 60 tablet 3   atorvastatin (LIPITOR) 40 MG tablet Take 1 tablet (40 mg total) by mouth daily. 90 tablet 1   blood glucose meter kit and supplies Dispense based on patient and insurance preference (Accuchek Aviva Plus, Accuchek Nano). Use up to four times daily as directed. (FOR ICD-10 E10.9, E11.9). 1 each 0   gabapentin (NEURONTIN) 300 MG capsule Take 1 capsule by mouth at bedtime 90 capsule 2   Insulin Pen Needle (NOVOFINE PEN NEEDLE) 32G X 6 MM MISC 1 each by Does not apply route daily. 100 each 2   levETIRAcetam  (KEPPRA) 500 MG tablet Take 1 tablet by mouth twice daily 60 tablet 3   SOLIQUA 100-33 UNT-MCG/ML SOPN INJECT 15 TO 30 UNITS SUBCUTANEOUSLY ONCE DAILY 15 mL 0   No current facility-administered medications for this visit.    PHYSICAL EXAMINATION: ECOG PERFORMANCE STATUS: 0 - Asymptomatic  BP (!) 142/76 (Patient Position: Sitting)   Pulse 62   Temp (!) 97.2 F (36.2 C) (Tympanic)   Resp 17   Wt 154 lb (69.9 kg)   SpO2 100%   BMI 26.43 kg/m   Filed Weights   07/25/21 1019  Weight: 154 lb (69.9 kg)    Physical Exam Constitutional:      Comments: Patient is accompanied by his wife.  He is in a wheelchair.  HENT:     Head: Normocephalic and atraumatic.     Mouth/Throat:     Pharynx: No oropharyngeal exudate.  Eyes:     Pupils: Pupils are equal, round, and reactive to light.  Cardiovascular:     Rate and Rhythm: Normal rate and regular rhythm.  Pulmonary:     Effort: No respiratory distress.     Breath sounds: No wheezing.     Comments: Decreased breath sounds bilaterally no wheeze or crackles. Abdominal:     General: Bowel sounds are normal. There is no distension.     Palpations: Abdomen is soft. There is no mass.     Tenderness: There is no abdominal tenderness. There is no guarding or rebound.  Musculoskeletal:        General: No tenderness. Normal range of motion.     Cervical back: Normal range of motion and neck supple.  Skin:    General: Skin is warm.  Neurological:     Mental Status: He is alert and oriented to person, place, and time.  Psychiatric:        Mood and Affect: Affect normal.   LABORATORY DATA:  I have reviewed the data as listed    Component Value Date/Time   NA 136 07/25/2021 1005   NA 139 01/01/2016 1000   K 4.1 07/25/2021 1005   CL 104 07/25/2021 1005   CO2 25 07/25/2021 1005   GLUCOSE 140 (H) 07/25/2021 1005   BUN 12 07/25/2021 1005   BUN 13 01/01/2016 1000   CREATININE 1.19 07/25/2021 1005   CREATININE 1.20 01/09/2021 1226    CALCIUM 8.8 (L) 07/25/2021 1005   PROT 7.4 07/25/2021 1005   PROT 7.2 01/01/2016 1000   ALBUMIN 3.8 07/25/2021 1005   ALBUMIN 4.4 01/01/2016 1000   AST 29 07/25/2021 1005   ALT 56 (H) 07/25/2021 1005   ALKPHOS 100 07/25/2021 1005   BILITOT 0.5 07/25/2021 1005   BILITOT 0.4 01/01/2016 1000   GFRNONAA >60 07/25/2021 1005   GFRNONAA 62 01/09/2021 1226   GFRAA 72 01/09/2021 1226    No results found for: SPEP, UPEP  Lab Results  Component Value Date   WBC 4.0 07/25/2021   NEUTROABS 1.8 07/25/2021   HGB 15.0 07/25/2021   HCT 47.3 07/25/2021   MCV 80.7 07/25/2021   PLT 233 07/25/2021      Chemistry      Component Value Date/Time   NA 136 07/25/2021 1005   NA 139 01/01/2016 1000   K 4.1 07/25/2021 1005   CL 104 07/25/2021 1005   CO2 25 07/25/2021 1005   BUN 12 07/25/2021 1005   BUN 13 01/01/2016 1000   CREATININE 1.19 07/25/2021 1005   CREATININE 1.20 01/09/2021 1226      Component Value Date/Time   CALCIUM 8.8 (L) 07/25/2021 1005   ALKPHOS 100 07/25/2021 1005   AST 29 07/25/2021 1005   ALT 56 (H) 07/25/2021 1005   BILITOT 0.5 07/25/2021 1005   BILITOT 0.4 01/01/2016 1000       RADIOGRAPHIC STUDIES: I have personally reviewed the radiological images as listed and agreed with the findings in the report. No results found.   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  Will repeat CT CAP in 2 months/Jan 2023.   #Brain metastases -MRI brain MARCH 2022-progressive disease s/p WBRT- MAY 24th, MRI 2022- Improved. Continue off therapy- [off lorlatinib because of poor tolerance]; MRI planned-December 9th 2022 [Dr.Vaslow]-repeat eval on dec 16th, 2022.   # Bilateral PE & left lower extremity DVT:s/p IVC filter; currently on eliquis 2.5 g BID [5/13]-stable  # Procedures: Toe nail  extraction [Dr.Fowler]/Dental extractions  [will need to hold eliquis.]- wife will call for holding Eliquis prior to procedure.   # CKD stage III-creatinine 1.5 [GFR~51]-stable  # Peripheral Neuropathy- S stable? DM;continue  gabapentin 200 mg qhs-stable  # DISPOSITION:  # follow up in 2 months-MD; labs cbc/cmp;CT CAP-Dr.B  Cc; Dr.Sowles.    Orders Placed This Encounter  Procedures   CT ABDOMEN PELVIS W CONTRAST    Standing Status:   Future    Standing Expiration Date:   07/25/2022    Order Specific Question:   If indicated for the ordered procedure, I authorize the administration of contrast media per Radiology protocol    Answer:   Yes    Order Specific Question:   Preferred imaging location?    Answer:   [facility]    Order Specific Question:   Radiology Contrast Protocol - do NOT remove file path    Answer:   [email or web address]   CBC with Differential/Platelet    Standing Status:   Future    Standing Expiration Date:   07/25/2022   Comprehensive metabolic panel    Standing Status:   Future    Standing Expiration Date:   07/25/2022    All questions were answered. The patient knows to call the clinic with any problems, questions or concerns.      Cammie Sickle, MD 07/25/2021 12:00 PM

## 2021-07-25 NOTE — Progress Notes (Signed)
Patient here for oncology follow-up appointment, no new concerns today

## 2021-08-07 ENCOUNTER — Other Ambulatory Visit: Payer: Self-pay | Admitting: Radiation Therapy

## 2021-08-08 ENCOUNTER — Other Ambulatory Visit: Payer: Self-pay | Admitting: Internal Medicine

## 2021-08-11 ENCOUNTER — Encounter: Payer: Self-pay | Admitting: Internal Medicine

## 2021-08-15 ENCOUNTER — Encounter: Payer: Self-pay | Admitting: Internal Medicine

## 2021-08-15 ENCOUNTER — Other Ambulatory Visit: Payer: Self-pay

## 2021-08-15 ENCOUNTER — Other Ambulatory Visit: Payer: Self-pay | Admitting: Nurse Practitioner

## 2021-08-15 ENCOUNTER — Ambulatory Visit
Admission: RE | Admit: 2021-08-15 | Discharge: 2021-08-15 | Disposition: A | Discharge status: Home / Self Care | Payer: Medicare Other | Source: Ambulatory Visit | Attending: Internal Medicine | Admitting: Internal Medicine

## 2021-08-15 ENCOUNTER — Other Ambulatory Visit: Payer: Self-pay | Admitting: Internal Medicine

## 2021-08-15 DIAGNOSIS — C7931 Secondary malignant neoplasm of brain: Secondary | ICD-10-CM | POA: Diagnosis not present

## 2021-08-15 DIAGNOSIS — G9389 Other specified disorders of brain: Secondary | ICD-10-CM | POA: Diagnosis not present

## 2021-08-15 MED ORDER — DEXAMETHASONE 2 MG PO TABS: 2.0000 mg | ORAL_TABLET | Freq: Two times a day (BID) | ORAL | 0 refills | Status: DC

## 2021-08-15 MED ORDER — GADOBUTROL 1 MMOL/ML IV SOLN
7.0000 mL | Freq: Once | INTRAVENOUS | Status: AC | PRN
  Administered 2021-08-15: 7 mL via INTRAVENOUS

## 2021-08-15 NOTE — Progress Notes (Signed)
Spoke to patient's daughter, Hinton Dyer regarding progressive brain disease on MRI. Results reviewed by Dr. Mickeal Skinner and Dr. Rogue Bussing who recommend dex 2 mg BID in setting of poorly controlled blood sugars. Prescription sent to patient's pharmacy. Would like for him to be seen in Dr. Renda Rolls clinic next week and will also have him see Dr. Baruch Gouty for evaluation of possible RT and can also consider low dose Lorbrena (previously had poor tolerance at 100 mg).  Dr. Mickeal Skinner recommends repeating brain MRI in 1 month unless he continues to decline through decadron.

## 2021-08-15 NOTE — Progress Notes (Signed)
I tried to reach the patient's wife unable to leave a voicemail/voicemail box not set up. Lauren- Please follow up on the staff message that I sent you. Collete- Please have patient follow up with Lauren early next week labs [place]

## 2021-08-18 ENCOUNTER — Inpatient Hospital Stay: Payer: Medicare Other | Attending: Internal Medicine

## 2021-08-18 DIAGNOSIS — E1122 Type 2 diabetes mellitus with diabetic chronic kidney disease: Secondary | ICD-10-CM | POA: Insufficient documentation

## 2021-08-18 DIAGNOSIS — Z9181 History of falling: Secondary | ICD-10-CM | POA: Insufficient documentation

## 2021-08-18 DIAGNOSIS — Z7952 Long term (current) use of systemic steroids: Secondary | ICD-10-CM | POA: Insufficient documentation

## 2021-08-18 DIAGNOSIS — Z8042 Family history of malignant neoplasm of prostate: Secondary | ICD-10-CM | POA: Insufficient documentation

## 2021-08-18 DIAGNOSIS — C7951 Secondary malignant neoplasm of bone: Secondary | ICD-10-CM | POA: Insufficient documentation

## 2021-08-18 DIAGNOSIS — T380X5A Adverse effect of glucocorticoids and synthetic analogues, initial encounter: Secondary | ICD-10-CM | POA: Insufficient documentation

## 2021-08-18 DIAGNOSIS — R42 Dizziness and giddiness: Secondary | ICD-10-CM | POA: Insufficient documentation

## 2021-08-18 DIAGNOSIS — Z86711 Personal history of pulmonary embolism: Secondary | ICD-10-CM | POA: Insufficient documentation

## 2021-08-18 DIAGNOSIS — Z8249 Family history of ischemic heart disease and other diseases of the circulatory system: Secondary | ICD-10-CM | POA: Insufficient documentation

## 2021-08-18 DIAGNOSIS — R5383 Other fatigue: Secondary | ICD-10-CM | POA: Insufficient documentation

## 2021-08-18 DIAGNOSIS — N183 Chronic kidney disease, stage 3 unspecified: Secondary | ICD-10-CM | POA: Insufficient documentation

## 2021-08-18 DIAGNOSIS — Z818 Family history of other mental and behavioral disorders: Secondary | ICD-10-CM | POA: Insufficient documentation

## 2021-08-18 DIAGNOSIS — Z7901 Long term (current) use of anticoagulants: Secondary | ICD-10-CM | POA: Insufficient documentation

## 2021-08-18 DIAGNOSIS — Z794 Long term (current) use of insulin: Secondary | ICD-10-CM | POA: Insufficient documentation

## 2021-08-18 DIAGNOSIS — Z833 Family history of diabetes mellitus: Secondary | ICD-10-CM | POA: Insufficient documentation

## 2021-08-18 DIAGNOSIS — R609 Edema, unspecified: Secondary | ICD-10-CM | POA: Insufficient documentation

## 2021-08-18 DIAGNOSIS — C3412 Malignant neoplasm of upper lobe, left bronchus or lung: Secondary | ICD-10-CM | POA: Insufficient documentation

## 2021-08-18 DIAGNOSIS — Z79899 Other long term (current) drug therapy: Secondary | ICD-10-CM | POA: Insufficient documentation

## 2021-08-18 DIAGNOSIS — R413 Other amnesia: Secondary | ICD-10-CM | POA: Insufficient documentation

## 2021-08-18 DIAGNOSIS — D72829 Elevated white blood cell count, unspecified: Secondary | ICD-10-CM | POA: Insufficient documentation

## 2021-08-18 DIAGNOSIS — C7931 Secondary malignant neoplasm of brain: Secondary | ICD-10-CM | POA: Insufficient documentation

## 2021-08-18 DIAGNOSIS — Z86718 Personal history of other venous thrombosis and embolism: Secondary | ICD-10-CM | POA: Insufficient documentation

## 2021-08-18 DIAGNOSIS — Z8719 Personal history of other diseases of the digestive system: Secondary | ICD-10-CM | POA: Insufficient documentation

## 2021-08-18 DIAGNOSIS — Z8673 Personal history of transient ischemic attack (TIA), and cerebral infarction without residual deficits: Secondary | ICD-10-CM | POA: Insufficient documentation

## 2021-08-18 DIAGNOSIS — R531 Weakness: Secondary | ICD-10-CM | POA: Insufficient documentation

## 2021-08-18 DIAGNOSIS — R972 Elevated prostate specific antigen [PSA]: Secondary | ICD-10-CM | POA: Insufficient documentation

## 2021-08-19 ENCOUNTER — Telehealth: Payer: Self-pay | Admitting: Nurse Practitioner

## 2021-08-19 ENCOUNTER — Telehealth: Payer: Self-pay

## 2021-08-19 DIAGNOSIS — C7931 Secondary malignant neoplasm of brain: Secondary | ICD-10-CM

## 2021-08-19 NOTE — Telephone Encounter (Addendum)
Message received from Dr. B on 08/15/2021:   Regarding: RE: Brain MRI- worsening disease.  MD would like pt to be scheduled for Jenny/Lauren-next week; labs CBC CMP-   Message was also sent to Dr. Mickeal Skinner and Dr. Baruch Gouty for referral/opinion.  Dr. Jacinto Reap placed/signed for Avastin.

## 2021-08-19 NOTE — Telephone Encounter (Signed)
Patient has been scheduled to see for lab/Dr. Vaslow/NP/Avastin on 08/22/21 (Confirmed that the Avastin does not require PA).  Dr. Massie Maroon will review patient's chart when he returns to the office on Wed (08/21/21).

## 2021-08-19 NOTE — Telephone Encounter (Signed)
Called patient's spouse to review recommendation for avastin. No answer. Unable to leave voicemail. Patient is scheduled for evaluation later this week and possible avastin for recent brain disease. Would also consider re-trying lobrena at reduced dose of 50 mg daily. He was previously unable to tolerate 100 mg d/t hallucinations and agitation. Could consider having him see Dr. Baruch Gouty to see if radiation is an option. He has appointment scheduled with Dr. Mickeal Skinner later this week.

## 2021-08-22 ENCOUNTER — Inpatient Hospital Stay: Payer: Medicare Other

## 2021-08-22 ENCOUNTER — Telehealth: Payer: Self-pay | Admitting: *Deleted

## 2021-08-22 ENCOUNTER — Ambulatory Visit: Payer: Self-pay | Admitting: *Deleted

## 2021-08-22 ENCOUNTER — Telehealth: Payer: Self-pay

## 2021-08-22 ENCOUNTER — Encounter: Payer: Self-pay | Admitting: Pharmacist

## 2021-08-22 ENCOUNTER — Inpatient Hospital Stay: Payer: Medicare Other | Admitting: Internal Medicine

## 2021-08-22 ENCOUNTER — Other Ambulatory Visit: Payer: Self-pay | Admitting: Family Medicine

## 2021-08-22 ENCOUNTER — Other Ambulatory Visit: Payer: Self-pay | Admitting: Pharmacist

## 2021-08-22 ENCOUNTER — Encounter: Payer: Self-pay | Admitting: Oncology

## 2021-08-22 ENCOUNTER — Inpatient Hospital Stay (HOSPITAL_BASED_OUTPATIENT_CLINIC_OR_DEPARTMENT_OTHER): Payer: Medicare Other | Admitting: Oncology

## 2021-08-22 ENCOUNTER — Other Ambulatory Visit: Payer: Self-pay

## 2021-08-22 VITALS — BP 131/78 | HR 76

## 2021-08-22 VITALS — BP 144/94 | HR 94 | Temp 97.4°F | Resp 16 | Wt 153.4 lb

## 2021-08-22 DIAGNOSIS — E785 Hyperlipidemia, unspecified: Secondary | ICD-10-CM

## 2021-08-22 DIAGNOSIS — R531 Weakness: Secondary | ICD-10-CM | POA: Diagnosis not present

## 2021-08-22 DIAGNOSIS — Z8719 Personal history of other diseases of the digestive system: Secondary | ICD-10-CM | POA: Diagnosis not present

## 2021-08-22 DIAGNOSIS — C3412 Malignant neoplasm of upper lobe, left bronchus or lung: Secondary | ICD-10-CM | POA: Diagnosis not present

## 2021-08-22 DIAGNOSIS — R413 Other amnesia: Secondary | ICD-10-CM | POA: Diagnosis not present

## 2021-08-22 DIAGNOSIS — R972 Elevated prostate specific antigen [PSA]: Secondary | ICD-10-CM | POA: Diagnosis not present

## 2021-08-22 DIAGNOSIS — T380X5A Adverse effect of glucocorticoids and synthetic analogues, initial encounter: Secondary | ICD-10-CM | POA: Diagnosis not present

## 2021-08-22 DIAGNOSIS — C3490 Malignant neoplasm of unspecified part of unspecified bronchus or lung: Secondary | ICD-10-CM

## 2021-08-22 DIAGNOSIS — Z833 Family history of diabetes mellitus: Secondary | ICD-10-CM | POA: Diagnosis not present

## 2021-08-22 DIAGNOSIS — C7951 Secondary malignant neoplasm of bone: Secondary | ICD-10-CM | POA: Diagnosis not present

## 2021-08-22 DIAGNOSIS — Z7901 Long term (current) use of anticoagulants: Secondary | ICD-10-CM | POA: Diagnosis not present

## 2021-08-22 DIAGNOSIS — I69354 Hemiplegia and hemiparesis following cerebral infarction affecting left non-dominant side: Secondary | ICD-10-CM

## 2021-08-22 DIAGNOSIS — C7931 Secondary malignant neoplasm of brain: Secondary | ICD-10-CM | POA: Diagnosis not present

## 2021-08-22 DIAGNOSIS — I700 Atherosclerosis of aorta: Secondary | ICD-10-CM

## 2021-08-22 DIAGNOSIS — R42 Dizziness and giddiness: Secondary | ICD-10-CM | POA: Diagnosis not present

## 2021-08-22 DIAGNOSIS — Z86718 Personal history of other venous thrombosis and embolism: Secondary | ICD-10-CM | POA: Diagnosis not present

## 2021-08-22 DIAGNOSIS — Z8249 Family history of ischemic heart disease and other diseases of the circulatory system: Secondary | ICD-10-CM | POA: Diagnosis not present

## 2021-08-22 DIAGNOSIS — E1122 Type 2 diabetes mellitus with diabetic chronic kidney disease: Secondary | ICD-10-CM | POA: Diagnosis not present

## 2021-08-22 DIAGNOSIS — Z86711 Personal history of pulmonary embolism: Secondary | ICD-10-CM | POA: Diagnosis not present

## 2021-08-22 DIAGNOSIS — Z794 Long term (current) use of insulin: Secondary | ICD-10-CM | POA: Diagnosis not present

## 2021-08-22 DIAGNOSIS — Z9181 History of falling: Secondary | ICD-10-CM | POA: Diagnosis not present

## 2021-08-22 DIAGNOSIS — Z8673 Personal history of transient ischemic attack (TIA), and cerebral infarction without residual deficits: Secondary | ICD-10-CM | POA: Diagnosis not present

## 2021-08-22 DIAGNOSIS — E7800 Pure hypercholesterolemia, unspecified: Secondary | ICD-10-CM

## 2021-08-22 DIAGNOSIS — D72829 Elevated white blood cell count, unspecified: Secondary | ICD-10-CM | POA: Diagnosis not present

## 2021-08-22 DIAGNOSIS — R5383 Other fatigue: Secondary | ICD-10-CM | POA: Diagnosis not present

## 2021-08-22 DIAGNOSIS — N183 Chronic kidney disease, stage 3 unspecified: Secondary | ICD-10-CM | POA: Diagnosis not present

## 2021-08-22 DIAGNOSIS — E1169 Type 2 diabetes mellitus with other specified complication: Secondary | ICD-10-CM

## 2021-08-22 DIAGNOSIS — R609 Edema, unspecified: Secondary | ICD-10-CM | POA: Diagnosis not present

## 2021-08-22 DIAGNOSIS — Z7952 Long term (current) use of systemic steroids: Secondary | ICD-10-CM | POA: Diagnosis not present

## 2021-08-22 DIAGNOSIS — Z79899 Other long term (current) drug therapy: Secondary | ICD-10-CM | POA: Diagnosis not present

## 2021-08-22 LAB — COMPREHENSIVE METABOLIC PANEL
ALT: 57 U/L — ABNORMAL HIGH (ref 0–44)
AST: 20 U/L (ref 15–41)
Albumin: 4 g/dL (ref 3.5–5.0)
Alkaline Phosphatase: 108 U/L (ref 38–126)
Anion gap: 10 (ref 5–15)
BUN: 23 mg/dL (ref 8–23)
CO2: 25 mmol/L (ref 22–32)
Calcium: 9.4 mg/dL (ref 8.9–10.3)
Chloride: 96 mmol/L — ABNORMAL LOW (ref 98–111)
Creatinine, Ser: 1.35 mg/dL — ABNORMAL HIGH (ref 0.61–1.24)
GFR, Estimated: 57 mL/min — ABNORMAL LOW (ref >60)
Glucose, Bld: 353 mg/dL — ABNORMAL HIGH (ref 70–99)
Potassium: 4.1 mmol/L (ref 3.5–5.1)
Sodium: 131 mmol/L — ABNORMAL LOW (ref 135–145)
Total Bilirubin: 0.9 mg/dL (ref 0.3–1.2)
Total Protein: 7.6 g/dL (ref 6.5–8.1)

## 2021-08-22 LAB — CBC WITH DIFFERENTIAL/PLATELET
Abs Immature Granulocytes: 0.03 10*3/uL (ref 0.00–0.07)
Basophils Absolute: 0 10*3/uL (ref 0.0–0.1)
Basophils Relative: 0 %
Eosinophils Absolute: 0 10*3/uL (ref 0.0–0.5)
Eosinophils Relative: 0 %
HCT: 50.4 % (ref 39.0–52.0)
Hemoglobin: 16.8 g/dL (ref 13.0–17.0)
Immature Granulocytes: 0 %
Lymphocytes Relative: 14 %
Lymphs Abs: 1.5 10*3/uL (ref 0.7–4.0)
MCH: 26.7 pg (ref 26.0–34.0)
MCHC: 33.3 g/dL (ref 30.0–36.0)
MCV: 80 fL (ref 80.0–100.0)
Monocytes Absolute: 0.6 10*3/uL (ref 0.1–1.0)
Monocytes Relative: 6 %
Neutro Abs: 8.7 10*3/uL — ABNORMAL HIGH (ref 1.7–7.7)
Neutrophils Relative %: 80 %
Platelets: 248 10*3/uL (ref 150–400)
RBC: 6.3 MIL/uL — ABNORMAL HIGH (ref 4.22–5.81)
RDW: 15.7 % — ABNORMAL HIGH (ref 11.5–15.5)
WBC: 10.8 10*3/uL — ABNORMAL HIGH (ref 4.0–10.5)
nRBC: 0 % (ref 0.0–0.2)

## 2021-08-22 LAB — PROTEIN, URINE, RANDOM: Total Protein, Urine: 9 mg/dL

## 2021-08-22 MED ORDER — SODIUM CHLORIDE 0.9 % IV SOLN
INTRAVENOUS | Status: DC
  Filled 2021-08-22 (×2): qty 250

## 2021-08-22 MED ORDER — SODIUM CHLORIDE 0.9 % IV SOLN
700.0000 mg | Freq: Once | INTRAVENOUS | Status: AC
  Administered 2021-08-22: 700 mg via INTRAVENOUS
  Filled 2021-08-22: qty 16

## 2021-08-22 MED ORDER — LORLATINIB 25 MG PO TABS: 50.0000 mg | ORAL_TABLET | Freq: Every day | ORAL | 1 refills | Status: AC

## 2021-08-22 MED ORDER — SODIUM CHLORIDE 0.9 % IV SOLN
Freq: Once | INTRAVENOUS | Status: AC
  Filled 2021-08-22: qty 250

## 2021-08-22 NOTE — Patient Instructions (Signed)
[facility] AT [place]  Discharge Instructions: Thank you for choosing [facility] to provide your oncology and hematology care.  If you have a lab appointment with the [facility], please go directly to the [facility] and check in at the registration area.  Wear comfortable clothing and clothing appropriate for easy access to any Portacath or PICC line.   We strive to give you quality time with your provider. You may need to reschedule your appointment if you arrive late (15 or more minutes).  Arriving late affects you and other patients whose appointments are after yours.  Also, if you miss three or more appointments without notifying the office, you may be dismissed from the clinic at the providers discretion.      For prescription refill requests, have your pharmacy contact our office and allow 72 hours for refills to be completed.    Today you received the following chemotherapy and/or immunotherapy agents Bevacizumab      To help prevent nausea and vomiting after your treatment, we encourage you to take your nausea medication as directed.  BELOW ARE SYMPTOMS THAT SHOULD BE REPORTED IMMEDIATELY: *FEVER GREATER THAN 100.4 F (38 C) OR HIGHER *CHILLS OR SWEATING *NAUSEA AND VOMITING THAT IS NOT CONTROLLED WITH YOUR NAUSEA MEDICATION *UNUSUAL SHORTNESS OF BREATH *UNUSUAL BRUISING OR BLEEDING *URINARY PROBLEMS (pain or burning when urinating, or frequent urination) *BOWEL PROBLEMS (unusual diarrhea, constipation, pain near the anus) TENDERNESS IN MOUTH AND THROAT WITH OR WITHOUT PRESENCE OF ULCERS (sore throat, sores in mouth, or a toothache) UNUSUAL RASH, SWELLING OR PAIN  UNUSUAL VAGINAL DISCHARGE OR ITCHING   Items with * indicate a potential emergency and should be followed up as soon as possible or go to the Emergency Department if any problems should occur.  Please show the CHEMOTHERAPY ALERT CARD or IMMUNOTHERAPY ALERT CARD at check-in to  the Emergency Department and triage nurse.  Should you have questions after your visit or need to cancel or reschedule your appointment, please contact [facility] [place] AT [place]  [phone] and follow the prompts.  Office hours are 8:00 a.m. to 4:30 p.m. Monday - Friday. Please note that voicemails left after 4:00 p.m. may not be returned until the following business day.  We are closed weekends and major holidays. You have access to a nurse at all times for urgent questions. Please call the main number to the clinic [phone] and follow the prompts.  For any non-urgent questions, you may also contact your provider using MyChart. We now offer e-Visits for anyone 44 and older to request care online for non-urgent symptoms. For details visit [email or web address].   Also download the MyChart app! Go to the app store, search "MyChart", open the app, select [facility], and log in with your MyChart username and password.  Due to Covid, a mask is required upon entering the hospital/clinic. If you do not have a mask, one will be given to you upon arrival. For doctor visits, patients may have 1 support person aged 68 or older with them. For treatment visits, patients cannot have anyone with them due to current Covid guidelines and our immunocompromised population.    Bevacizumab injection What is this medication? BEVACIZUMAB (be va SIZ yoo mab) is a monoclonal antibody. It is used to treat many types of cancer. This medicine may be used for other purposes; ask your health care provider or pharmacist if you have questions. COMMON BRAND NAME(S): Alymsys, Avastin, MVASI, Zirabev What should I  tell my care team before I take this medication? They need to know if you have any of these conditions: diabetes heart disease high blood pressure history of coughing up blood prior anthracycline chemotherapy (e.g., doxorubicin, daunorubicin, epirubicin) recent or ongoing radiation  therapy recent or planning to have surgery stroke an unusual or allergic reaction to bevacizumab, hamster proteins, mouse proteins, other medicines, foods, dyes, or preservatives pregnant or trying to get pregnant breast-feeding How should I use this medication? This medicine is for infusion into a vein. It is given by a health care professional in a hospital or clinic setting. Talk to your pediatrician regarding the use of this medicine in children. Special care may be needed. Overdosage: If you think you have taken too much of this medicine contact a poison control center or emergency room at once. NOTE: This medicine is only for you. Do not share this medicine with others. What if I miss a dose? It is important not to miss your dose. Call your doctor or health care professional if you are unable to keep an appointment. What may interact with this medication? Interactions are not expected. This list may not describe all possible interactions. Give your health care provider a list of all the medicines, herbs, non-prescription drugs, or dietary supplements you use. Also tell them if you smoke, drink alcohol, or use illegal drugs. Some items may interact with your medicine. What should I watch for while using this medication? Your condition will be monitored carefully while you are receiving this medicine. You will need important blood work and urine testing done while you are taking this medicine. This medicine may increase your risk to bruise or bleed. Call your doctor or health care professional if you notice any unusual bleeding. Before having surgery, talk to your health care provider to make sure it is ok. This drug can increase the risk of poor healing of your surgical site or wound. You will need to stop this drug for 28 days before surgery. After surgery, wait at least 28 days before restarting this drug. Make sure the surgical site or wound is healed enough before restarting this drug.  Talk to your health care provider if questions. Do not become pregnant while taking this medicine or for 6 months after stopping it. Women should inform their doctor if they wish to become pregnant or think they might be pregnant. There is a potential for serious side effects to an unborn child. Talk to your health care professional or pharmacist for more information. Do not breast-feed an infant while taking this medicine and for 6 months after the last dose. This medicine has caused ovarian failure in some women. This medicine may interfere with the ability to have a child. You should talk to your doctor or health care professional if you are concerned about your fertility. What side effects may I notice from receiving this medication? Side effects that you should report to your doctor or health care professional as soon as possible: allergic reactions like skin rash, itching or hives, swelling of the face, lips, or tongue chest pain or chest tightness chills coughing up blood high fever seizures severe constipation signs and symptoms of bleeding such as bloody or black, tarry stools; red or dark-brown urine; spitting up blood or brown material that looks like coffee grounds; red spots on the skin; unusual bruising or bleeding from the eye, gums, or nose signs and symptoms of a blood clot such as breathing problems; chest pain; severe, sudden headache;  pain, swelling, warmth in the leg signs and symptoms of a stroke like changes in vision; confusion; trouble speaking or understanding; severe headaches; sudden numbness or weakness of the face, arm or leg; trouble walking; dizziness; loss of balance or coordination stomach pain sweating swelling of legs or ankles vomiting weight gain Side effects that usually do not require medical attention (report to your doctor or health care professional if they continue or are bothersome): back pain changes in taste decreased appetite dry  skin nausea tiredness This list may not describe all possible side effects. Call your doctor for medical advice about side effects. You may report side effects to FDA at 1-800-FDA-1088. Where should I keep my medication? This drug is given in a hospital or clinic and will not be stored at home. NOTE: This sheet is a summary. It may not cover all possible information. If you have questions about this medicine, talk to your doctor, pharmacist, or health care provider.  2022 Elsevier/Gold Standard (2021-05-13 00:00:00)

## 2021-08-22 NOTE — Telephone Encounter (Signed)
Call to patient's PCP, Dr. Ancil Boozer, to provide an update on patient.  Message left with triage nurse that patient has started steroid for brain mets and his glucose is elevated in office today.  Patient's wife says they have not checked glucose since he has started the steroid.    Requested PCP office to call patient to discuss diabetes treatment.

## 2021-08-22 NOTE — Telephone Encounter (Signed)
Per Dr. Baruch Gouty, He agrees with Dr. Jacinto Reap and Dr. Renda Rolls plan and would not recommend more radiation.

## 2021-08-22 NOTE — Telephone Encounter (Signed)
Nurse triage called to alert PCP about elevated blood glucose level currently experienced by patient. Soliqua dosage increased and patient was scheduled for follow up on Monday with Dr. Ancil Boozer for further management. Patient was also advised to follow a strict low sugar diet over the weekend and to regularly check glucose levels to ensure safe ranges. He was also advised if symptoms worsen or he notices any further abnormalities over the weekend to report to the nearest Urgent Care or Emergency Department.

## 2021-08-22 NOTE — Progress Notes (Signed)
[facility] OFFICE PROGRESS NOTE  Patient Care Team: [name], [name], [name], [name], [facility] (Pharmacist) Ventura Sellers, MD as Consulting Physician (Psychiatry) Billey Co, MD as Consulting Physician (Urology)   Cancer Staging  No matching staging information was found for the patient.   Oncology History Overview Note  # OCT 2017- ADENO CA LUNG; STAGE IV [; LUL; bil supraclavicular LN; Left neck LN Bx]; ROS-1 MUTATED; s/p Carbo-alimta x1[oct 2017]  # NOV 1st 2017- XALKORI 250 mg BID; JAN 15th CT- PR;  # AUG 27th Chemo-RT to persistent LUL/mediastinal LN [s/p carbo-taxol- with RT; finished Sep 22nd 2018];   # July 14th 2020- multiple metastatic lesions of the brain [July 30 whole brain radiation finished April 21, 2019]; bone scan skull metastases /posterior left ninth rib metastases; April 04, 2019 stopped crizotinib;  #April 25, 2019-start Entrectinib 600 mg once a day; STopped in NOV 2020 [dizziness]; NOV 2020-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  # DEC 1st 2020- start avastin q 2w; x6; MRI October 30, 2019-significantly improved left temporal lesion subcentimeter for stable lesions.;  [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   #June 2022-stopped lorlatinib [severe delirium/aggression]; currently on surveillance.  In the  interim patient also underwent-HOLEP on May 6.   # LLE DVT/bil PE/Multiple strokes [? 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  # MRI brain- multiple infarcts [2d echo/bubble study-NEG's/p Neurology eval]  ------------------------------------------------------------    # # s/p TURP [Sep 2017; Dr.Cope] DEC 26th CT- distended bladder;  # Elevated PSA-  FEB 2021- 12;  S/p Bx- Negative; follow with urology/Dr.Stoioff   # MOLECULAR TESTING- ROS-1 POSITIVE; ALK/EGFR-NEG; PDL-1 EXPRESSION- 90%** [HIGH]  # DEC 15th 2020- PALLIATIVE CARE -----------------------------------------------------------------    DIAGNOSIS: 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  He recently had surveillance MRI of his brain which showed worrisome findings suggesting progression of disease.  They are here today to discuss MRI and treatment moving forward.  He presents today with his wife.  She states that over the past few weeks he has had increased confusion, forgetfulness and choppy communication.  States he fell onto his bottom after trying to put on his pants the other day.  He denies any headaches, nausea or vomiting.  He is on his Eliquis and has been compliant. Denies bleeding.   Review of Systems  Constitutional:  Positive for malaise/fatigue. Negative for chills, fever and weight loss.  HENT:  Negative for congestion, ear pain and tinnitus.   Eyes: Negative.  Negative for blurred vision and double vision.  Respiratory: Negative.   Negative for cough, sputum production and shortness of breath.   Cardiovascular: Negative.  Negative for chest pain, palpitations and leg swelling.  Gastrointestinal: Negative.  Negative for abdominal pain, constipation, diarrhea, nausea and vomiting.  Genitourinary:  Negative for dysuria, frequency and urgency.  Musculoskeletal:  Positive for falls. Negative for back pain.  Skin: Negative.  Negative for rash.  Neurological:  Positive for dizziness and weakness. Negative for headaches.  Endo/Heme/Allergies: Negative.  Does not bruise/bleed easily.  Psychiatric/Behavioral:  Positive for memory loss. Negative for depression. The patient is not nervous/anxious and does not have insomnia.        Confusion   PAST MEDICAL HISTORY :  Past Medical History:  Diagnosis Date   Abnormal prostate specific antigen 08/08/2012   Adiposity 04/16/2015   Chronic kidney disease (CKD), stage III (moderate) ([place]) 11/27/2016   CVA (cerebral vascular accident) ([place]) 06/17/2016   Diabetes mellitus without complication ([place])    Diverticulosis of sigmoid colon 04/16/2015   Dyslipidemia 03/18/2015   Hemorrhoids, internal 04/16/2015   Hypercholesteremia 04/16/2015   Hyperlipidemia    Hypertension    Primary cancer of left upper lobe of lung ([place])    Pulmonary embolism ([place])    Wears dentures    partial upper    PAST SURGICAL HISTORY :   Past Surgical History:  Procedure Laterality Date   COLONOSCOPY     COLONOSCOPY WITH PROPOFOL N/A 05/06/2015   Procedure: COLONOSCOPY WITH PROPOFOL;  Surgeon: Lucilla Lame, MD;  Location: [facility];  Service: Endoscopy;  Laterality: N/A;  ASCENDING COLON POLYPS X 2 TERMINAL ILEUM BIOPSY RANDOM COLON BX. TRANSVERSE COLON POLYP SIGMOID COLON POLYP   ESOPHAGOGASTRODUODENOSCOPY (EGD) WITH PROPOFOL N/A 05/06/2015   Procedure: ESOPHAGOGASTRODUODENOSCOPY (EGD) WITH PROPOFOL;  Surgeon: Lucilla Lame, MD;  Location: [facility];  Service: Endoscopy;  Laterality: N/A;  GASTRIC  BIOPSY X1   HOLEP-LASER ENUCLEATION OF THE PROSTATE WITH MORCELLATION N/A 01/10/2021   Procedure: HOLEP-LASER ENUCLEATION OF THE PROSTATE WITH MORCELLATION;  Surgeon: Billey Co, MD;  Location: [facility];  Service: Urology;  Laterality: N/A;   IVC FILTER INSERTION N/A 12/29/2020   Procedure: IVC FILTER INSERTION;  Surgeon: Dwana Curd, MD;  Location: [facility];  Service: Cardiovascular;  Laterality: N/A;   KIDNEY STONE SURGERY  2017    FAMILY HISTORY :   Family History  Problem Relation Age of Onset   Diabetes Mother    Diabetes Father    CAD Father    Dementia Father    Diabetes Sister    Cancer Maternal Uncle        Prostate   Cancer Cousin        prostate    SOCIAL HISTORY:   Social History   Tobacco Use   Smoking status: Never   Smokeless tobacco: Never   Tobacco comments:    Smoking cessation materials not required  Vaping Use   Vaping Use: Never used  Substance Use Topics   Alcohol use: No    Alcohol/week: 0.0 standard drinks   Drug use: No    ALLERGIES:  has No Known Allergies.  MEDICATIONS:  Current Outpatient Medications  Medication Sig Dispense Refill   apixaban (ELIQUIS) 2.5 MG TABS tablet Take 1 tablet by mouth twice daily 60 tablet 3   atorvastatin (LIPITOR) 40 MG tablet Take 1 tablet (40 mg total) by  mouth daily. 90 tablet 1   blood glucose meter kit and supplies Dispense based on patient and insurance preference (Accuchek Aviva Plus, Accuchek Nano). Use up to four times daily as directed. (FOR ICD-10 E10.9, E11.9). 1 each 0   dexamethasone (DECADRON) 2 MG tablet Take 1 tablet (2 mg total) by mouth 2 (two) times daily. 60 tablet 0   gabapentin (NEURONTIN) 300 MG capsule Take 1 capsule by mouth at bedtime 90 capsule 2   Insulin Pen Needle (NOVOFINE PEN NEEDLE) 32G X 6 MM MISC 1 each by Does not apply route daily. 100 each 2   levETIRAcetam (KEPPRA) 500 MG tablet Take 1 tablet by mouth twice daily 60 tablet 0   SOLIQUA 100-33  UNT-MCG/ML SOPN INJECT 15 TO 30 UNITS SUBCUTANEOUSLY ONCE DAILY 15 mL 0   No current facility-administered medications for this visit.    PHYSICAL EXAMINATION: ECOG PERFORMANCE STATUS: 0 - Asymptomatic  There were no vitals taken for this visit.  There were no vitals filed for this visit.   Physical Exam Constitutional:      Appearance: Normal appearance.  HENT:     Head: Normocephalic and atraumatic.  Eyes:     Pupils: Pupils are equal, round, and reactive to light.  Cardiovascular:     Rate and Rhythm: Normal rate and regular rhythm.     Heart sounds: Normal heart sounds. No murmur heard. Pulmonary:     Effort: Pulmonary effort is normal.     Breath sounds: Normal breath sounds. No wheezing.  Abdominal:     General: Bowel sounds are normal. There is no distension.     Palpations: Abdomen is soft.     Tenderness: There is no abdominal tenderness.  Musculoskeletal:        General: Normal range of motion.     Cervical back: Normal range of motion.  Skin:    General: Skin is warm and dry.     Findings: No rash.  Neurological:     General: No focal deficit present.     Mental Status: He is alert and oriented to person, place, and time.     Cranial Nerves: Cranial nerves 2-12 are intact.     Motor: Weakness present.     Gait: Gait is intact.  Psychiatric:        Mood and Affect: Mood and affect normal.        Cognition and Memory: Memory normal.        Judgment: Judgment normal.   LABORATORY DATA:  I have reviewed the data as listed    Component Value Date/Time   NA 136 07/25/2021 1005   NA 139 01/01/2016 1000   K 4.1 07/25/2021 1005   CL 104 07/25/2021 1005   CO2 25 07/25/2021 1005   GLUCOSE 140 (H) 07/25/2021 1005   BUN 12 07/25/2021 1005   BUN 13 01/01/2016 1000   CREATININE 1.19 07/25/2021 1005   CREATININE 1.20 01/09/2021 1226   CALCIUM 8.8 (L) 07/25/2021 1005   PROT 7.4 07/25/2021 1005   PROT 7.2 01/01/2016 1000   ALBUMIN 3.8 07/25/2021 1005    ALBUMIN 4.4 01/01/2016 1000   AST 29 07/25/2021 1005   ALT 56 (H) 07/25/2021 1005   ALKPHOS 100 07/25/2021 1005   BILITOT 0.5 07/25/2021 1005   BILITOT 0.4 01/01/2016 1000   GFRNONAA >60 07/25/2021 1005   GFRNONAA 62 01/09/2021 1226   GFRAA 72 01/09/2021 1226    No results found for: SPEP, UPEP  Lab Results  Component Value Date   WBC 4.0 07/25/2021   NEUTROABS 1.8 07/25/2021   HGB 15.0 07/25/2021   HCT 47.3 07/25/2021   MCV 80.7 07/25/2021   PLT 233 07/25/2021      Chemistry      Component Value Date/Time   NA 136 07/25/2021 1005   NA 139 01/01/2016 1000   K 4.1 07/25/2021 1005   CL 104 07/25/2021 1005   CO2 25 07/25/2021 1005   BUN 12 07/25/2021 1005   BUN 13 01/01/2016 1000   CREATININE 1.19 07/25/2021 1005   CREATININE 1.20 01/09/2021 1226      Component Value Date/Time   CALCIUM 8.8 (L) 07/25/2021 1005   ALKPHOS 100 07/25/2021 1005   AST 29 07/25/2021 1005   ALT 56 (H) 07/25/2021 1005   BILITOT 0.5 07/25/2021 1005   BILITOT 0.4 01/01/2016 1000       RADIOGRAPHIC STUDIES: I have personally reviewed the radiological images as listed and agreed with the findings in the report. No results found.   ASSESSMENT & PLAN: Melvin Willis is a 70 year old male who presents to clinic today to discuss recent MRI and to start Avastin.   Discussed MRI results with patient and wife.  MRI shows increase in size of left temporal, frontal and right midbrain lesion with new increased edema.  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  No new lesions.  Case was discussed with Dr. Mickeal Skinner who recommended he start on steroids and have repeat MRI in 1 month. He was started   Given great tolerance to Avastin for previous brain mets, Dr. Rogue Bussing recommended starting back on Avastin every 2 weeks for 6 cycles.  He was previously also on lobrena 100 mg/day with poor tolerance.  Spoke with [employer] regarding reinitiating medication at a  lower dose 50 mg/day.  She will reach out to discuss with patient and wife.  Clinically patient is stable.  Neuro exam is intact.  He started on 2 mg dexamethasone twice per day on Saturday and wife has noted some improvement of his symptoms.  Labs today are acceptable for treatment although sodium is 131 and creatinine is slightly up at 1.35.  We will give 1 L NACL with his Avastin today.  Hyperglycemia-uncontrolled diabetes exacerbated by steroids.  Patient has not taken his insulin today.  Wife states they do have the ability to check his blood sugar at home.  Recommend getting in touch with PCP to see if he needs to be on a sliding scale while on steroids.  Leukocytosis-mild and secondary to steroid use.  Disposition- Avastin today. RTC in 2 weeks for lab work see Dr. Rogue Bussing and possible Avastin. Reschedule Dr. Renda Rolls visit.  Repeat MRI in 1 month.  Alyson to discuss starting Lobrena at lower dose (50 mg/daily).   I spent 45 minutes dedicated to the care of this patient (face-to-face and non-face-to-face) on the date of the encounter to include what is described in the assessment and plan.   No orders of the defined types were placed in this encounter.   All questions were answered. The patient knows to call the clinic with any problems, questions or concerns.      Jacquelin Hawking, NP 08/22/2021 8:30 AM

## 2021-08-22 NOTE — Progress Notes (Signed)
Name: Melvin Willis   MRN: [id number]    DOB: 06/06/1951   Date:08/25/2021       Progress Note  Subjective  Chief Complaint  Labwork Follow Up  HPI  Diabetes with hyperglycemia: we received a call from [facility] oncology department because patient's glucose was over 300 during his infusion on Friday. He was started on Decadron for progression of brain disease/mets on MRI done earlier December. He is also not as compliant with his diet and has been having sweets more often . Wife states since Friday his glucose has been okay in the 140 range. She asked me to not give him expensive medications. Discussed regular insulin on SSI before meals. Explained not to stack insulin , needs to be at least 4 hours apart and we will start with a more conservative dosage. They agreed     Patient Active Problem List   Diagnosis Date Noted   Hyperlipidemia associated with type 2 diabetes mellitus ([place]) 02/05/2021   Deep vein thrombosis (DVT) of proximal lower extremity (HCC)    Urinary retention 12/29/2020   Leg DVT (deep venous thromboembolism), acute, bilateral ([place]) 12/29/2020   Delirium    Palliative care encounter    Pneumonia due to infectious organism    Supratherapeutic INR 11/14/2020   Acute-on-chronic kidney injury ([place]) 11/14/2020   Brain metastasis ([place]) 09/25/2019   Atherosclerosis of aorta ([place]) 09/04/2019   Primary malignant neoplasm of lung metastatic to other site [facility]) 04/07/2019   Chronic deep vein thrombosis (DVT) of left lower extremity ([place]) 01/05/2018   Chronic kidney disease (CKD), stage III (moderate) ([place]) 11/27/2016   Blurred vision, bilateral 06/23/2016   Primary cancer of left upper lobe of lung ([place]) 06/12/2016   Mediastinal mass    History of nephrolithiasis 03/27/2016   Pharyngeal dysphagia 01/01/2016   Benign neoplasm of ascending colon    Benign neoplasm of sigmoid colon    Benign neoplasm of transverse colon    Diverticulosis of large intestine without  diverticulitis    Overweight (BMI 25.0-29.9) 04/16/2015   Type 2 diabetes mellitus with stage 3 chronic kidney disease ([place]) 03/18/2015   Dyslipidemia 03/18/2015   Disorder of male genital organ 08/08/2012   Nodular prostate with urinary obstruction 08/08/2012   Elevated PSA 08/08/2012    Past Surgical History:  Procedure Laterality Date   COLONOSCOPY     COLONOSCOPY WITH PROPOFOL N/A 05/06/2015   Procedure: COLONOSCOPY WITH PROPOFOL;  Surgeon: Lucilla Lame, MD;  Location: [place];  Service: Endoscopy;  Laterality: N/A;  ASCENDING COLON POLYPS X 2 TERMINAL ILEUM BIOPSY RANDOM COLON BX. TRANSVERSE COLON POLYP SIGMOID COLON POLYP   ESOPHAGOGASTRODUODENOSCOPY (EGD) WITH PROPOFOL N/A 05/06/2015   Procedure: ESOPHAGOGASTRODUODENOSCOPY (EGD) WITH PROPOFOL;  Surgeon: Lucilla Lame, MD;  Location: [place];  Service: Endoscopy;  Laterality: N/A;  GASTRIC BIOPSY X1   HOLEP-LASER ENUCLEATION OF THE PROSTATE WITH MORCELLATION N/A 01/10/2021   Procedure: HOLEP-LASER ENUCLEATION OF THE PROSTATE WITH MORCELLATION;  Surgeon: Billey Co, MD;  Location: [facility];  Service: Urology;  Laterality: N/A;   IVC FILTER INSERTION N/A 12/29/2020   Procedure: IVC FILTER INSERTION;  Surgeon: Dwana Curd, MD;  Location: [place] [facility];  Service: Cardiovascular;  Laterality: N/A;   KIDNEY STONE SURGERY  2017    Family History  Problem Relation Age of Onset   Diabetes Mother    Diabetes Father    CAD Father    Dementia Father    Diabetes Sister    Cancer  Maternal Uncle        Prostate   Cancer Cousin        prostate    Social History   Tobacco Use   Smoking status: Never   Smokeless tobacco: Never   Tobacco comments:    Smoking cessation materials not required  Substance Use Topics   Alcohol use: No    Alcohol/week: 0.0 standard drinks     Current Outpatient Medications:    apixaban (ELIQUIS) 2.5 MG TABS tablet, Take 1 tablet by mouth twice daily, Disp: 60  tablet, Rfl: 3   atorvastatin (LIPITOR) 40 MG tablet, Take 1 tablet by mouth once daily, Disp: 90 tablet, Rfl: 0   blood glucose meter kit and supplies, Dispense based on patient and insurance preference (Accuchek Aviva Plus, Accuchek Nano). Use up to four times daily as directed. (FOR ICD-10 E10.9, E11.9)., Disp: 1 each, Rfl: 0   dexamethasone (DECADRON) 2 MG tablet, Take 1 tablet (2 mg total) by mouth 2 (two) times daily., Disp: 60 tablet, Rfl: 0   gabapentin (NEURONTIN) 300 MG capsule, Take 1 capsule by mouth at bedtime, Disp: 90 capsule, Rfl: 2   Insulin Pen Needle (NOVOFINE PEN NEEDLE) 32G X 6 MM MISC, 1 each by Does not apply route daily., Disp: 100 each, Rfl: 2   levETIRAcetam (KEPPRA) 500 MG tablet, Take 1 tablet by mouth twice daily, Disp: 60 tablet, Rfl: 0   lorlatinib (LORBRENA) 25 MG tablet, Take 2 tablets (50 mg total) by mouth daily., Disp: 60 tablet, Rfl: 1   SOLIQUA 100-33 UNT-MCG/ML SOPN, INJECT 15 TO 30 UNITS SUBCUTANEOUSLY ONCE DAILY, Disp: 15 mL, Rfl: 0  No Known Allergies  I personally reviewed active problem list, medication list, allergies, family history, social history, health maintenance with the patient/caregiver today.   ROS  Ten systems reviewed and is negative except as mentioned in HPI   Objective  Vitals:   08/25/21 1526  BP: 126/82  Pulse: 71  Resp: 16  Temp: 98 F (36.7 C)  SpO2: 99%  Weight: 154 lb (69.9 kg)  Height: '5\' 5"'  (1.651 m)    Body mass index is 25.63 kg/m.  Physical Exam  Constitutional: Patient appears well-developed and well-nourished.  No distress.  HEENT: head atraumatic, normocephalic, pupils equal and reactive to light, neck supple Cardiovascular: Normal rate, regular rhythm and normal heart sounds.  No murmur heard. No BLE edema. Pulmonary/Chest: Effort normal and breath sounds normal. No respiratory distress. Abdominal: Soft.  There is no tenderness. Psychiatric: Patient has a normal mood and affect. behavior is normal.  Judgment and thought content normal.   Recent Results (from the past 2160 hour(s))  Comprehensive metabolic panel     Status: Abnormal   Collection Time: 05/30/21 10:09 AM  Result Value Ref Range   Sodium 134 (L) 135 - 145 mmol/L   Potassium 4.0 3.5 - 5.1 mmol/L   Chloride 104 98 - 111 mmol/L   CO2 23 22 - 32 mmol/L   Glucose, Bld 159 (H) 70 - 99 mg/dL    Comment: Glucose reference range applies only to samples taken after fasting for at least 8 hours.   BUN 14 8 - 23 mg/dL   Creatinine, Ser 1.17 0.61 - 1.24 mg/dL   Calcium 9.0 8.9 - 10.3 mg/dL   Total Protein 7.1 6.5 - 8.1 g/dL   Albumin 3.8 3.5 - 5.0 g/dL   AST 34 15 - 41 U/L   ALT 58 (H) 0 - 44 U/L   Alkaline  Phosphatase 100 38 - 126 U/L   Total Bilirubin 0.6 0.3 - 1.2 mg/dL   GFR, Estimated >60 >60 mL/min    Comment: (NOTE) Calculated using the CKD-EPI Creatinine Equation (2021)    Anion gap 7 5 - 15    Comment: Performed at [facility], [address] [place], [place] [address]  CBC with Differential     Status: Abnormal   Collection Time: 05/30/21 10:09 AM  Result Value Ref Range   WBC 4.1 4.0 - 10.5 K/uL   RBC 5.48 4.22 - 5.81 MIL/uL   Hemoglobin 13.4 13.0 - 17.0 g/dL   HCT 42.4 39.0 - 52.0 %   MCV 77.4 (L) 80.0 - 100.0 fL   MCH 24.5 (L) 26.0 - 34.0 pg   MCHC 31.6 30.0 - 36.0 g/dL   RDW 17.4 (H) 11.5 - 15.5 %   Platelets 217 150 - 400 K/uL   nRBC 0.0 0.0 - 0.2 %   Neutrophils Relative % 45 %   Neutro Abs 1.9 1.7 - 7.7 K/uL   Lymphocytes Relative 37 %   Lymphs Abs 1.5 0.7 - 4.0 K/uL   Monocytes Relative 15 %   Monocytes Absolute 0.6 0.1 - 1.0 K/uL   Eosinophils Relative 2 %   Eosinophils Absolute 0.1 0.0 - 0.5 K/uL   Basophils Relative 1 %   Basophils Absolute 0.0 0.0 - 0.1 K/uL   Immature Granulocytes 0 %   Abs Immature Granulocytes 0.01 0.00 - 0.07 K/uL    Comment: Performed at [facility], [place], [place] [address]  POCT HgB A1C     Status: Abnormal   Collection Time:  06/11/21  8:53 AM  Result Value Ref Range   Hemoglobin A1C 7.0 (A) 4.0 - 5.6 %   HbA1c POC (<> result, manual entry)     HbA1c, POC (prediabetic range)     HbA1c, POC (controlled diabetic range)    Microalbumin / creatinine urine ratio     Status: Abnormal   Collection Time: 06/11/21  9:32 AM  Result Value Ref Range   Creatinine, Urine 140 20 - 320 mg/dL   Microalb, Ur 5.4 mg/dL    Comment: Reference Range Not established    Microalb Creat Ratio 39 (H) <30 mcg/mg creat    Comment: . The ADA defines abnormalities in albumin excretion as follows: Marland Kitchen Albuminuria Category        Result (mcg/mg creatinine) . Normal to Mildly increased   <30 Moderately increased         30-299  Severely increased           > OR = 300 . The ADA recommends that at least two of three specimens collected within a 3-6 month period be abnormal before considering a patient to be within a diagnostic category.   Comprehensive metabolic panel     Status: Abnormal   Collection Time: 07/25/21 10:05 AM  Result Value Ref Range   Sodium 136 135 - 145 mmol/L   Potassium 4.1 3.5 - 5.1 mmol/L   Chloride 104 98 - 111 mmol/L   CO2 25 22 - 32 mmol/L   Glucose, Bld 140 (H) 70 - 99 mg/dL    Comment: Glucose reference range applies only to samples taken after fasting for at least 8 hours.   BUN 12 8 - 23 mg/dL   Creatinine, Ser 1.19 0.61 - 1.24 mg/dL   Calcium 8.8 (L) 8.9 - 10.3 mg/dL   Total Protein 7.4 6.5 -  8.1 g/dL   Albumin 3.8 3.5 - 5.0 g/dL   AST 29 15 - 41 U/L   ALT 56 (H) 0 - 44 U/L   Alkaline Phosphatase 100 38 - 126 U/L   Total Bilirubin 0.5 0.3 - 1.2 mg/dL   GFR, Estimated >60 >60 mL/min    Comment: (NOTE) Calculated using the CKD-EPI Creatinine Equation (2021)    Anion gap 7 5 - 15    Comment: Performed at [facility], [address] [place], [place] [address]  CBC with Differential     Status: Abnormal   Collection Time: 07/25/21 10:05 AM  Result Value Ref Range   WBC 4.0 4.0 - 10.5  K/uL   RBC 5.86 (H) 4.22 - 5.81 MIL/uL   Hemoglobin 15.0 13.0 - 17.0 g/dL   HCT 47.3 39.0 - 52.0 %   MCV 80.7 80.0 - 100.0 fL   MCH 25.6 (L) 26.0 - 34.0 pg   MCHC 31.7 30.0 - 36.0 g/dL   RDW 17.2 (H) 11.5 - 15.5 %   Platelets 233 150 - 400 K/uL   nRBC 0.0 0.0 - 0.2 %   Neutrophils Relative % 45 %   Neutro Abs 1.8 1.7 - 7.7 K/uL   Lymphocytes Relative 40 %   Lymphs Abs 1.6 0.7 - 4.0 K/uL   Monocytes Relative 12 %   Monocytes Absolute 0.5 0.1 - 1.0 K/uL   Eosinophils Relative 2 %   Eosinophils Absolute 0.1 0.0 - 0.5 K/uL   Basophils Relative 1 %   Basophils Absolute 0.0 0.0 - 0.1 K/uL   Immature Granulocytes 0 %   Abs Immature Granulocytes 0.00 0.00 - 0.07 K/uL    Comment: Performed at [facility], [address] [place], [place] [address]  Comprehensive metabolic panel     Status: Abnormal   Collection Time: 08/22/21  9:25 AM  Result Value Ref Range   Sodium 131 (L) 135 - 145 mmol/L   Potassium 4.1 3.5 - 5.1 mmol/L   Chloride 96 (L) 98 - 111 mmol/L   CO2 25 22 - 32 mmol/L   Glucose, Bld 353 (H) 70 - 99 mg/dL    Comment: Glucose reference range applies only to samples taken after fasting for at least 8 hours.   BUN 23 8 - 23 mg/dL   Creatinine, Ser 1.35 (H) 0.61 - 1.24 mg/dL   Calcium 9.4 8.9 - 10.3 mg/dL   Total Protein 7.6 6.5 - 8.1 g/dL   Albumin 4.0 3.5 - 5.0 g/dL   AST 20 15 - 41 U/L   ALT 57 (H) 0 - 44 U/L   Alkaline Phosphatase 108 38 - 126 U/L   Total Bilirubin 0.9 0.3 - 1.2 mg/dL   GFR, Estimated 57 (L) >60 mL/min    Comment: (NOTE) Calculated using the CKD-EPI Creatinine Equation (2021)    Anion gap 10 5 - 15    Comment: Performed at [facility], [address] [place], [place] [address]  CBC with Differential/Platelet     Status: Abnormal   Collection Time: 08/22/21  9:25 AM  Result Value Ref Range   WBC 10.8 (H) 4.0 - 10.5 K/uL   RBC 6.30 (H) 4.22 - 5.81 MIL/uL   Hemoglobin 16.8 13.0 - 17.0 g/dL   HCT 50.4 39.0 - 52.0 %   MCV 80.0 80.0  - 100.0 fL   MCH 26.7 26.0 - 34.0 pg   MCHC 33.3 30.0 - 36.0 g/dL   RDW 15.7 (H) 11.5 -  15.5 %   Platelets 248 150 - 400 K/uL   nRBC 0.0 0.0 - 0.2 %   Neutrophils Relative % 80 %   Neutro Abs 8.7 (H) 1.7 - 7.7 K/uL   Lymphocytes Relative 14 %   Lymphs Abs 1.5 0.7 - 4.0 K/uL   Monocytes Relative 6 %   Monocytes Absolute 0.6 0.1 - 1.0 K/uL   Eosinophils Relative 0 %   Eosinophils Absolute 0.0 0.0 - 0.5 K/uL   Basophils Relative 0 %   Basophils Absolute 0.0 0.0 - 0.1 K/uL   Immature Granulocytes 0 %   Abs Immature Granulocytes 0.03 0.00 - 0.07 K/uL    Comment: Performed at [facility], [address] [place], [place] [address]  Protein, urine, random     Status: None   Collection Time: 08/22/21 11:15 AM  Result Value Ref Range   Total Protein, Urine 9 mg/dL    Comment: NO NORMAL RANGE ESTABLISHED FOR THIS TEST Performed at [facility], [address] [place], [place] [address]     PHQ2/9: Depression screen [facility] 2/9 08/25/2021 06/11/2021 04/24/2021 02/05/2021 01/09/2021  Decreased Interest 0 0 0 0 2  Down, Depressed, Hopeless 0 0 0 0 2  PHQ - 2 Score 0 0 0 0 4  Altered sleeping 0 - - - 0  Tired, decreased energy 0 - - - 0  Change in appetite 0 - - - 0  Feeling bad or failure about yourself  0 - - - 0  Trouble concentrating 0 - - - 0  Moving slowly or fidgety/restless 0 - - - 0  Suicidal thoughts 0 - - - 0  PHQ-9 Score 0 - - - 4  Difficult doing work/chores - - - - -  Some recent data might be hidden    phq 9 is negative   Fall Risk: Fall Risk  08/25/2021 06/11/2021 04/24/2021 02/05/2021 01/09/2021  Falls in the past year? 0 0 1 1 0  Number falls in past yr: 0 0 1 1 0  Injury with Fall? 0 0 0 0 0  Risk for fall due to : No Fall Risks History of fall(s) History of fall(s) History of fall(s);Impaired balance/gait;Impaired mobility -  Follow up Falls prevention discussed Falls prevention discussed Falls prevention discussed Falls evaluation completed -       Functional Status Survey: Is the patient deaf or have difficulty hearing?: No Does the patient have difficulty seeing, even when wearing glasses/contacts?: No Does the patient have difficulty concentrating, remembering, or making decisions?: Yes Does the patient have difficulty walking or climbing stairs?: No Does the patient have difficulty dressing or bathing?: No Does the patient have difficulty doing errands alone such as visiting a doctor's office or shopping?: No    Assessment & Plan  1. Type 2 diabetes mellitus with hyperglycemia, with long-term current use of insulin (HCC)  - insulin regular human CONCENTRATED (HUMULIN R U-500 KWIKPEN) 500 UNIT/ML KwikPen; Inject 1-10 Units into the skin 3 (three) times daily with meals. Pre-meal insulin :  Check fsbs prior to meals  Hold if below 130 If glucose 130- 140 give 2 units If glucose 140 to 180 give 4 units  If glucose 180 to 250 give 6 units If glucose above 250 give 8 units  Dispense: 15 mL; Refill: 0 - POCT Glucose (CBG)    2. Brain metastasis ([place])   3. Long term systemic steroid user  Started Dec 9 th Decadron BID

## 2021-08-22 NOTE — Telephone Encounter (Signed)
Call received from [place], [place] at [facility] [phone] to report blood sugar at 9:30 am 353.   Chief Complaint: blood sugar 353 Symptoms: na Frequency: na Pertinent Negatives: Patient denies na Disposition: [] ED /[] Urgent Care (no appt availability in office) / [] Appointment(In office/virtual)/ []  [facility]/ [x] Home Care/ call PCP [] Refused Recommended Disposition  Additional Notes:  Patient on prednisone and not checking blood sugars at home. Please advise . FC notified . Spoke with Nurse Katharine Look and PCP. Will increase dose of Soliqua by 4 units daily and see in office 08/25/21 at 3:40 pm. Called Cancer center and spoke with Nira Conn to inform patient and wife recommendations to increase soliqua insulin by 4 units daily and appt with PCP 08/25/21 at 3:40 pm. Have patient's wife call back for further questions.    Reason for Disposition  [1] Blood glucose > 300 mg/dL (16.7 mmol/L) AND [2] two or more times in a row  Answer Assessment - Initial Assessment Questions 1. BLOOD GLUCOSE: "What is your blood glucose level?"      353 at 9:30 2. ONSET: "When did you check the blood glucose?"     08/22/21 at 9:30 am 3. USUAL RANGE: "What is your glucose level usually?" (e.g., usual fasting morning value, usual evening value)     Na  4. KETONES: "Do you check for ketones (urine or blood test strips)?" If yes, ask: "What does the test show now?"      na 5. TYPE 1 or 2:  "Do you know what type of diabetes you have?"  (e.g., Type 1, Type 2, Gestational; doesn't know)      Na  6. INSULIN: "Do you take insulin?" "What type of insulin(s) do you use? What is the mode of delivery? (syringe, pen; injection or pump)?"      Yes  7. DIABETES PILLS: "Do you take any pills for your diabetes?" If yes, ask: "Have you missed taking any pills recently?"     Na  8. OTHER SYMPTOMS: "Do you have any symptoms?" (e.g., fever, frequent urination, difficulty breathing, dizziness, weakness, vomiting)      None reported at this time .  9. PREGNANCY: "Is there any chance you are pregnant?" "When was your last menstrual period?"     na  Protocols used: Diabetes - High Blood Sugar-A-AH

## 2021-08-22 NOTE — Progress Notes (Signed)
Oral Chemotherapy Pharmacist Encounter   Per Sonia Baller, NP, patient is to resume treatment with loratinib. He previously did not tolerate treatment but will be started back on a reduced dose of 50 mg daily.   Prescription sent to the dispensing pharmacy for [employer] manufacturer assistance program. He is enrolled in this program until the end of the 2023.   Darl Pikes, PharmD, BCPS, BCOP, CPP Hematology/Oncology Clinical Pharmacist [place]/DB/AP Oral [facility] [phone]  08/22/2021 2:56 PM

## 2021-08-22 NOTE — Telephone Encounter (Signed)
Received call from PCP with instructions for patient to increase Soliqua by 4 units, check glucose twice a day, and follow up with Dr. Ancil Boozer on [facility] 08/25/21 @ 3:40.  These recommendations have been written down to be given to patient's wife when she comes to pick him up today.

## 2021-08-25 ENCOUNTER — Encounter: Payer: Self-pay | Admitting: Family Medicine

## 2021-08-25 ENCOUNTER — Telehealth: Payer: Self-pay | Admitting: Pharmacy Technician

## 2021-08-25 ENCOUNTER — Telehealth: Payer: Self-pay | Admitting: *Deleted

## 2021-08-25 ENCOUNTER — Ambulatory Visit (INDEPENDENT_AMBULATORY_CARE_PROVIDER_SITE_OTHER): Payer: Medicare Other | Admitting: Family Medicine

## 2021-08-25 ENCOUNTER — Encounter: Payer: Self-pay | Admitting: Internal Medicine

## 2021-08-25 ENCOUNTER — Other Ambulatory Visit (HOSPITAL_COMMUNITY): Payer: Self-pay

## 2021-08-25 VITALS — BP 126/82 | HR 71 | Temp 98.0°F | Resp 16 | Ht 65.0 in | Wt 154.0 lb

## 2021-08-25 DIAGNOSIS — Z7952 Long term (current) use of systemic steroids: Secondary | ICD-10-CM

## 2021-08-25 DIAGNOSIS — E1165 Type 2 diabetes mellitus with hyperglycemia: Secondary | ICD-10-CM | POA: Diagnosis not present

## 2021-08-25 DIAGNOSIS — C7931 Secondary malignant neoplasm of brain: Secondary | ICD-10-CM | POA: Diagnosis not present

## 2021-08-25 DIAGNOSIS — Z794 Long term (current) use of insulin: Secondary | ICD-10-CM

## 2021-08-25 LAB — GLUCOSE, POCT (MANUAL RESULT ENTRY): POC Glucose: 122 mg/dl — AB (ref 70–99)

## 2021-08-25 MED ORDER — HUMULIN R U-500 KWIKPEN 500 UNIT/ML ~~LOC~~ SOPN: 1.0000 [IU] | PEN_INJECTOR | Freq: Three times a day (TID) | SUBCUTANEOUS | 0 refills | Status: AC

## 2021-08-25 NOTE — Telephone Encounter (Signed)
Attempted to reach patient today to follow-up with him s/p new start avastin. No answer on cell phone.   Contacted home line- spoke with wife. Pt is doing well s/p his avastin treatment. Pt is having issues with hyperglycemia, but she stated that the cancer center pharmacist and clinical team is aware. The patient's pcp is making adjustments in the home meds. He has an apt with pcp today.  Wife will be over to cancer center later to complete any paperwork for Bethena Roys for the patient assistance.

## 2021-08-26 ENCOUNTER — Other Ambulatory Visit (HOSPITAL_COMMUNITY): Payer: Self-pay

## 2021-08-26 NOTE — Telephone Encounter (Signed)
Oral Oncology Patient Advocate Encounter  Prior Authorization for Melvin Willis has been approved.    PA# [id number] Effective dates: 08/25/21 through 02/21/22  Oral Oncology Clinic will continue to follow.   [name] Phone [phone] Fax [phone] 08/26/2021 12:22 PM

## 2021-08-26 NOTE — Telephone Encounter (Signed)
Oral Oncology Patient Advocate Encounter   Received notification from [facility] that the existing prior authorization for Melvin Willis is due for renewal.   Renewal PA submitted on CoverMyMeds Key [id number] Status is pending   Oral Oncology Clinic will continue to follow.  [name] Patient [name] Phone [phone] Fax [phone] 08/26/2021 12:22 PM

## 2021-08-28 ENCOUNTER — Other Ambulatory Visit: Payer: Self-pay | Admitting: *Deleted

## 2021-08-28 ENCOUNTER — Other Ambulatory Visit: Payer: Self-pay | Admitting: Family Medicine

## 2021-08-28 DIAGNOSIS — C3412 Malignant neoplasm of upper lobe, left bronchus or lung: Secondary | ICD-10-CM

## 2021-08-28 DIAGNOSIS — C7931 Secondary malignant neoplasm of brain: Secondary | ICD-10-CM

## 2021-09-02 ENCOUNTER — Encounter: Payer: Self-pay | Admitting: Internal Medicine

## 2021-09-05 ENCOUNTER — Inpatient Hospital Stay: Payer: Medicare Other | Admitting: Internal Medicine

## 2021-09-05 ENCOUNTER — Telehealth: Payer: Self-pay | Admitting: Internal Medicine

## 2021-09-05 ENCOUNTER — Inpatient Hospital Stay: Payer: Medicare Other

## 2021-09-05 NOTE — Telephone Encounter (Signed)
Taken care of reschedule and LVM for pt

## 2021-09-05 NOTE — Telephone Encounter (Signed)
Caregiver called to reschedule pt's appt for today. Call back at [phone]

## 2021-09-07 ENCOUNTER — Other Ambulatory Visit: Payer: Self-pay | Admitting: Internal Medicine

## 2021-09-07 ENCOUNTER — Other Ambulatory Visit: Payer: Self-pay | Admitting: Nurse Practitioner

## 2021-09-09 ENCOUNTER — Encounter: Payer: Self-pay | Admitting: Internal Medicine

## 2021-09-12 ENCOUNTER — Inpatient Hospital Stay: Payer: Medicare Other | Attending: Internal Medicine | Admitting: Internal Medicine

## 2021-09-12 ENCOUNTER — Other Ambulatory Visit: Payer: Self-pay

## 2021-09-12 VITALS — BP 145/77 | HR 64 | Temp 97.3°F | Resp 16 | Wt 152.4 lb

## 2021-09-12 DIAGNOSIS — Z86711 Personal history of pulmonary embolism: Secondary | ICD-10-CM | POA: Insufficient documentation

## 2021-09-12 DIAGNOSIS — N401 Enlarged prostate with lower urinary tract symptoms: Secondary | ICD-10-CM | POA: Diagnosis not present

## 2021-09-12 DIAGNOSIS — I82402 Acute embolism and thrombosis of unspecified deep veins of left lower extremity: Secondary | ICD-10-CM | POA: Diagnosis not present

## 2021-09-12 DIAGNOSIS — G629 Polyneuropathy, unspecified: Secondary | ICD-10-CM | POA: Diagnosis not present

## 2021-09-12 DIAGNOSIS — Z818 Family history of other mental and behavioral disorders: Secondary | ICD-10-CM | POA: Insufficient documentation

## 2021-09-12 DIAGNOSIS — I6789 Other cerebrovascular disease: Secondary | ICD-10-CM | POA: Diagnosis not present

## 2021-09-12 DIAGNOSIS — Y842 Radiological procedure and radiotherapy as the cause of abnormal reaction of the patient, or of later complication, without mention of misadventure at the time of the procedure: Secondary | ICD-10-CM | POA: Diagnosis not present

## 2021-09-12 DIAGNOSIS — R413 Other amnesia: Secondary | ICD-10-CM | POA: Diagnosis not present

## 2021-09-12 DIAGNOSIS — Z8673 Personal history of transient ischemic attack (TIA), and cerebral infarction without residual deficits: Secondary | ICD-10-CM | POA: Insufficient documentation

## 2021-09-12 DIAGNOSIS — N3944 Nocturnal enuresis: Secondary | ICD-10-CM | POA: Diagnosis not present

## 2021-09-12 DIAGNOSIS — Z8042 Family history of malignant neoplasm of prostate: Secondary | ICD-10-CM | POA: Insufficient documentation

## 2021-09-12 DIAGNOSIS — Z5112 Encounter for antineoplastic immunotherapy: Secondary | ICD-10-CM | POA: Diagnosis not present

## 2021-09-12 DIAGNOSIS — Z7901 Long term (current) use of anticoagulants: Secondary | ICD-10-CM | POA: Insufficient documentation

## 2021-09-12 DIAGNOSIS — C770 Secondary and unspecified malignant neoplasm of lymph nodes of head, face and neck: Secondary | ICD-10-CM | POA: Insufficient documentation

## 2021-09-12 DIAGNOSIS — C7931 Secondary malignant neoplasm of brain: Secondary | ICD-10-CM | POA: Diagnosis not present

## 2021-09-12 DIAGNOSIS — R972 Elevated prostate specific antigen [PSA]: Secondary | ICD-10-CM | POA: Diagnosis not present

## 2021-09-12 DIAGNOSIS — I2699 Other pulmonary embolism without acute cor pulmonale: Secondary | ICD-10-CM | POA: Insufficient documentation

## 2021-09-12 DIAGNOSIS — Z79899 Other long term (current) drug therapy: Secondary | ICD-10-CM | POA: Insufficient documentation

## 2021-09-12 DIAGNOSIS — R5383 Other fatigue: Secondary | ICD-10-CM | POA: Insufficient documentation

## 2021-09-12 DIAGNOSIS — M2550 Pain in unspecified joint: Secondary | ICD-10-CM | POA: Diagnosis not present

## 2021-09-12 DIAGNOSIS — C3412 Malignant neoplasm of upper lobe, left bronchus or lung: Secondary | ICD-10-CM | POA: Insufficient documentation

## 2021-09-12 DIAGNOSIS — Z833 Family history of diabetes mellitus: Secondary | ICD-10-CM | POA: Diagnosis not present

## 2021-09-12 DIAGNOSIS — Z8719 Personal history of other diseases of the digestive system: Secondary | ICD-10-CM | POA: Insufficient documentation

## 2021-09-12 DIAGNOSIS — E1122 Type 2 diabetes mellitus with diabetic chronic kidney disease: Secondary | ICD-10-CM | POA: Insufficient documentation

## 2021-09-12 DIAGNOSIS — Z8249 Family history of ischemic heart disease and other diseases of the circulatory system: Secondary | ICD-10-CM | POA: Diagnosis not present

## 2021-09-12 DIAGNOSIS — N183 Chronic kidney disease, stage 3 unspecified: Secondary | ICD-10-CM | POA: Insufficient documentation

## 2021-09-12 MED ORDER — DEXAMETHASONE 1 MG PO TABS: 1.0000 mg | ORAL_TABLET | Freq: Every day | ORAL | 1 refills | Status: AC

## 2021-09-12 NOTE — Progress Notes (Signed)
[facility] [place], [place] [address] [phone]   Interval Evaluation  Date of Service: 09/12/21 Patient Name: Melvin Willis Patient MRN: [id number] Patient DOB: [birth year] Provider: Ventura Sellers, MD  Identifying Statement:  Melvin Willis is a 71 y.o. male with Brain metastasis [facility])  Radiation therapy induced brain necrosis   Primary Cancer:  Oncologic History: Oncology History Overview Note  # OCT 2017- ADENO CA LUNG; STAGE IV [; LUL; bil supraclavicular LN; Left neck LN Bx]; ROS-1 MUTATED; s/p Carbo-alimta x1[oct 2017]  # NOV 1st 2017- XALKORI 250 mg BID; JAN 15th CT- PR;  # AUG 27th Chemo-RT to persistent LUL/mediastinal LN [s/p carbo-taxol- with RT; finished Sep 22nd 2018];   # July 14th 2020- multiple metastatic lesions of the brain [July 30 whole brain radiation finished April 21, 2019]; bone scan skull metastases /posterior left ninth rib metastases; April 04, 2019 stopped crizotinib;  #April 25, 2019-start Entrectinib 600 mg once a day; STopped in NOV 2020 [dizziness]; NOV 2020-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  # DEC 1st 2020- start avastin q 2w; x6; MRI October 30, 2019-significantly improved left temporal lesion subcentimeter for stable lesions.;  [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   #June 2022-stopped lorlatinib [severe delirium/aggression]; currently on surveillance.  In the interim patient also  underwent-HOLEP on May 6.   # LLE DVT/bil PE/Multiple strokes [? 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  # MRI brain- multiple infarcts [2d echo/bubble study-NEG's/p Neurology eval]  ------------------------------------------------------------    # # s/p TURP [Sep 2017; Dr.Cope] DEC 26th CT- distended bladder;  # Elevated PSA-  FEB 2021- 12;  S/p Bx- Negative; follow with urology/Dr.Stoioff   # MOLECULAR TESTING- ROS-1 POSITIVE; ALK/EGFR-NEG; PDL-1 EXPRESSION- 90%** [HIGH]  # DEC 15th 2020- PALLIATIVE CARE -----------------------------------------------------------------    DIAGNOSIS: 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    H+P (02/06/21) Patient presents to clinic today to review recent mental status changes.  Spouse describes worsening memory impairment, speed of communication over the past two years.  In recent weeks, he has become agitated and difficult to manage at night at times.  One evening he woke up and went outside on the porch unsupervised.  At home, he is not independent with ADLs.  He does not dress himself without help, does not prepare meals, does not toilet independently.  Spouse noted he has mentioned seeing deceased relatives several times, but he has not behaved in a way that is harmful to himself or others.  Extensive history of stroke in addition to brain metastases, completed whole brain radiation therapy  in August 2020 with Dr. Baruch Gouty.  Recently stopped oral chemotherapy with Dr. Rogue Bussing.       Medications: Current Outpatient  Medications on File Prior to Visit  Medication Sig Dispense Refill   apixaban (ELIQUIS) 2.5 MG TABS tablet Take 1 tablet by mouth twice daily 60 tablet 3   atorvastatin (LIPITOR) 40 MG tablet Take 1 tablet by mouth once daily 90 tablet 0   blood glucose meter kit and supplies Dispense based on patient and insurance preference (Accuchek Aviva Plus, Accuchek Nano). Use up to four times daily as directed. (FOR ICD-10 E10.9, E11.9). 1 each 0   dexamethasone (DECADRON) 2 MG tablet Take 1 tablet (2 mg total) by mouth 2 (two) times daily. 60 tablet 0   gabapentin (NEURONTIN) 300 MG capsule Take 1 capsule by mouth at bedtime 90 capsule 2   Insulin Pen Needle (NOVOFINE PEN NEEDLE) 32G X 6 MM MISC 1 each by Does not apply route daily. 100 each 2   insulin regular human CONCENTRATED (HUMULIN R U-500 KWIKPEN) 500 UNIT/ML KwikPen Inject 1-10 Units into the skin 3 (three) times daily with meals. Pre-meal insulin :  Check fsbs prior to meals  Hold if below 130 If glucose 130- 140 give 2 units If glucose 140 to 180 give 4 units  If glucose 180 to 250 give 6 units If glucose above 250 give 8 units 15 mL 0   levETIRAcetam (KEPPRA) 500 MG tablet Take 1 tablet by mouth twice daily 60 tablet 0   lorlatinib (LORBRENA) 25 MG tablet Take 2 tablets (50 mg total) by mouth daily. 60 tablet 1   SOLIQUA 100-33 UNT-MCG/ML SOPN INJECT 15 TO 30 UNITS  SUBCUTANEOUSLY ONCE DAILY 15 mL 0   No current facility-administered medications on file prior to visit.    Allergies: No Known Allergies Past Medical History:  Past Medical History:  Diagnosis Date   Abnormal prostate specific antigen 08/08/2012   Adiposity 04/16/2015   Chronic kidney disease (CKD), stage III (moderate) ([place]) 11/27/2016   CVA (cerebral vascular accident) ([place]) 06/17/2016   Diabetes mellitus without complication ([place])    Diverticulosis of sigmoid colon 04/16/2015   Dyslipidemia 03/18/2015   Hemorrhoids, internal 04/16/2015   Hypercholesteremia 04/16/2015    Hyperlipidemia    Hypertension    Primary cancer of left upper lobe of lung ([place])    Pulmonary embolism ([place])    Wears dentures    partial upper   Past Surgical History:  Past Surgical History:  Procedure Laterality Date   COLONOSCOPY     COLONOSCOPY WITH PROPOFOL N/A 05/06/2015   Procedure: COLONOSCOPY WITH PROPOFOL;  Surgeon: Lucilla Lame, MD;  Location: [place] [facility];  Service: Endoscopy;  Laterality: N/A;  ASCENDING COLON POLYPS X 2 TERMINAL ILEUM BIOPSY RANDOM COLON BX. TRANSVERSE COLON POLYP SIGMOID COLON POLYP   ESOPHAGOGASTRODUODENOSCOPY (EGD) WITH PROPOFOL N/A 05/06/2015   Procedure: ESOPHAGOGASTRODUODENOSCOPY (EGD) WITH PROPOFOL;  Surgeon: Lucilla Lame, MD;  Location: [facility];  Service: Endoscopy;  Laterality: N/A;  GASTRIC BIOPSY X1   HOLEP-LASER ENUCLEATION OF THE PROSTATE WITH MORCELLATION N/A 01/10/2021   Procedure: HOLEP-LASER ENUCLEATION OF THE PROSTATE WITH MORCELLATION;  Surgeon: Billey Co, MD;  Location: [facility];  Service: Urology;  Laterality: N/A;   IVC FILTER INSERTION N/A 12/29/2020   Procedure: IVC FILTER INSERTION;  Surgeon: Dwana Curd, MD;  Location: [facility];  Service: Cardiovascular;  Laterality: N/A;   KIDNEY STONE SURGERY  2017   Social History:  Social History  Socioeconomic History   Marital status: Married    Spouse name: Melissa    Number of children: 3   Years of education: Not on file   Highest education level: Not on file  Occupational History   Occupation: disable     Comment: from CVA and lung cancer  Tobacco Use   Smoking status: Never   Smokeless tobacco: Never   Tobacco comments:    Smoking cessation materials not required  Vaping Use   Vaping Use: Never used  Substance and Sexual Activity   Alcohol use: No    Alcohol/week: 0.0 standard drinks   Drug use: No   Sexual activity: Not Currently    Partners: Female    Birth control/protection: None  Other Topics Concern   Not on file   Social History Narrative   Used to work until 2 years ago when got sick with DVT leg , PE , CVA and found out he had lung cancer. He has medicare now    Social Determinants of Radio broadcast assistant Strain: Low Risk    Difficulty of Paying Living Expenses: Not very hard  Food Insecurity: No Food Insecurity   Worried About Charity fundraiser in the Last Year: Never true   Arboriculturist in the Last Year: Never true  Transportation Needs: No Transportation Needs   Lack of Transportation (Medical): No   Lack of Transportation (Non-Medical): No  Physical Activity: Inactive   Days of Exercise per Week: 0 days   Minutes of Exercise per Session: 0 min  Stress: No Stress Concern Present   Feeling of Stress : Not at all  Social Connections: Moderately Integrated   Frequency of Communication with Friends and Family: More than three times a week   Frequency of Social Gatherings with Friends and Family: Three times a week   Attends Religious Services: More than 4 times per year   Active Member of Clubs or Organizations: No   Attends Archivist Meetings: Never   Marital Status: Married  Human resources officer Violence: Not At Risk   Fear of Current or Ex-Partner: No   Emotionally Abused: No   Physically Abused: No   Sexually Abused: No   Family History:  Family History  Problem Relation Age of Onset   Diabetes Mother    Diabetes Father    CAD Father    Dementia Father    Diabetes Sister    Cancer Maternal Uncle        Prostate   Cancer Cousin        prostate    Review of Systems: Constitutional: Doesn't report fevers, chills or abnormal weight loss Eyes: Doesn't report blurriness of vision Ears, nose, mouth, throat, and face: Doesn't report sore throat Respiratory: Doesn't report cough, dyspnea or wheezes Cardiovascular: Doesn't report palpitation, chest discomfort  Gastrointestinal:  Doesn't report nausea, constipation, diarrhea GU: Doesn't report  incontinence Skin: Doesn't report skin rashes Neurological: Per HPI Musculoskeletal: Doesn't report joint pain Behavioral/Psych: Doesn't report anxiety  Physical Exam: Vitals:   09/12/21 1000  BP: (!) 145/77  Pulse: 64  Resp: 16  Temp: (!) 97.3 F (36.3 C)  SpO2: 100%   KPS: 60. General: Alert, cooperative, pleasant, in no acute distress Head: Normal EENT: No conjunctival injection or scleral icterus.  Lungs: Resp effort normal Cardiac: Regular rate Abdomen: Non-distended abdomen Skin: No rashes cyanosis or petechiae. Extremities: No clubbing or edema  Neurologic Exam: Mental Status: Awake, alert, attentive to examiner. Oriented  to self and environment. Language is fluent with intact comprehension to simple commands.  Slow processing speed and age-advanced psychomotor slowing. Impaired recall.  Poor insight into nature of disease course. Cranial Nerves: Visual acuity is grossly normal. Visual fields are full. Extra-ocular movements intact. No ptosis. Face is symmetric Motor: Tone and bulk are normal. Power is full in both arms and legs. Reflexes are symmetric, no pathologic reflexes present.  Sensory: Intact to light touch Gait: Deferred   Labs: I have reviewed the data as listed    Component Value Date/Time   NA 131 (L) 08/22/2021 0925   NA 139 01/01/2016 1000   K 4.1 08/22/2021 0925   CL 96 (L) 08/22/2021 0925   CO2 25 08/22/2021 0925   GLUCOSE 353 (H) 08/22/2021 0925   BUN 23 08/22/2021 0925   BUN 13 01/01/2016 1000   CREATININE 1.35 (H) 08/22/2021 0925   CREATININE 1.20 01/09/2021 1226   CALCIUM 9.4 08/22/2021 0925   PROT 7.6 08/22/2021 0925   PROT 7.2 01/01/2016 1000   ALBUMIN 4.0 08/22/2021 0925   ALBUMIN 4.4 01/01/2016 1000   AST 20 08/22/2021 0925   ALT 57 (H) 08/22/2021 0925   ALKPHOS 108 08/22/2021 0925   BILITOT 0.9 08/22/2021 0925   BILITOT 0.4 01/01/2016 1000   GFRNONAA 57 (L) 08/22/2021 0925   GFRNONAA 62 01/09/2021 1226   GFRAA 72 01/09/2021  1226   Lab Results  Component Value Date   WBC 10.8 (H) 08/22/2021   NEUTROABS 8.7 (H) 08/22/2021   HGB 16.8 08/22/2021   HCT 50.4 08/22/2021   MCV 80.0 08/22/2021   PLT 248 08/22/2021   Imaging:  [place] Clinician Interpretation: I have personally reviewed the CNS images as listed.  My interpretation, in the context of the patient's clinical presentation, is treatment effect vs true progression  MR BRAIN W WO CONTRAST  Result Date: 08/15/2021 CLINICAL DATA:  Brain/central nervous system neoplasm; assess for treatment response. Patient reports more forgetfulness and agitation recently. History of adenocarcinoma of the lung, stage IV; whole brain radiation 2020. EXAM: MRI HEAD WITHOUT AND WITH CONTRAST TECHNIQUE: Multiplanar, multiecho pulse sequences of the brain and surrounding structures were obtained without and with intravenous contrast. CONTRAST:  30mL GADAVIST GADOBUTROL 1 MMOL/ML IV SOLN COMPARISON:  MR head 12/29/2020. FINDINGS: Brain: Increase in size of left anterior frontal lobe enhancing lesion now measuring 1.9 x 2.2 cm (previously 1.1 x 0.8 cm). There is increased surrounding edema. Increase in size of left temporal lesion measuring up to 3.7 x 2.6 cm (previously 1.4 cm). Increased associated susceptibility likely reflecting blood products. Nearby lesion along the more peripheral temporal lobe has also increased in size now measuring 5 mm (previously 2 mm) on series 18, image 73. Central midbrain lesion has increased in size measuring 9 mm (previously 5 mm) on image 72. Areas of chronic infarction and hemorrhage are again identified, most notably in the bilateral parietooccipital regions. Confluent T2 hyperintensity in the supratentorial white matter likely reflects sequelae of prior radiation therapy. Small chronic cerebellar infarcts are again noted bilaterally. Mild mass effect related to aforementioned new edema. No hydrocephalus. Vascular: Major vessel flow voids at the skull base are  preserved. Skull and upper cervical spine: Normal marrow signal is preserved. Sinuses/Orbits: Paranasal sinuses are aerated. Orbits are unremarkable. Other: Sella is unremarkable.  Persistent right mastoid effusion. IMPRESSION: Increase in size of left frontal, temporal, and right midbrain lesions with new/increased edema. Associated mass effect remains mild. The irregular appearance does raise possibility of  some component of delayed radiation necrosis. No new lesion. Electronically Signed   By: Macy Mis M.D.   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  Rate of growth, response to steroids/avastin, lack of novel metastases, and relative lack of symptomatology are all supportive.  He has received one infusion of avastin (08/22/21) but missed his most recent planned treatment.  We recommended two further avastin infusions, 3 weeks apart.  Following that, we can obtain a brain MRI study.   He should decrease decadron to 73m daily until next avastin, following which dose should be decreased to 120mdaily.  We ask that Melvin Willis to clinic in 6 weeks following next brain MRI (post avastin cycle #3), or sooner as needed.   We appreciate the opportunity to participate in the care of Melvin Willis  All questions were answered. The patient knows to call the clinic with any problems, questions or concerns. No barriers to learning were detected.  The total time spent in the encounter was 30 minutes and more than 50% was on counseling and review of test results   Melvin Willis Medical Director of Neuro-Oncology [facility] [place]1/06/23 10:12 AM

## 2021-09-12 NOTE — Progress Notes (Signed)
Pt reports hand cramping. No other concerns at this time.

## 2021-09-15 ENCOUNTER — Other Ambulatory Visit: Payer: Self-pay | Admitting: Radiation Therapy

## 2021-09-18 ENCOUNTER — Ambulatory Visit (INDEPENDENT_AMBULATORY_CARE_PROVIDER_SITE_OTHER): Payer: Medicare Other | Admitting: Urology

## 2021-09-18 ENCOUNTER — Other Ambulatory Visit: Payer: Self-pay

## 2021-09-18 ENCOUNTER — Encounter: Payer: Self-pay | Admitting: Urology

## 2021-09-18 ENCOUNTER — Encounter: Payer: Self-pay | Admitting: *Deleted

## 2021-09-18 VITALS — BP 153/83 | HR 59 | Ht 65.0 in | Wt 156.4 lb

## 2021-09-18 DIAGNOSIS — N138 Other obstructive and reflux uropathy: Secondary | ICD-10-CM | POA: Diagnosis not present

## 2021-09-18 DIAGNOSIS — N401 Enlarged prostate with lower urinary tract symptoms: Secondary | ICD-10-CM | POA: Diagnosis not present

## 2021-09-18 LAB — BLADDER SCAN AMB NON-IMAGING

## 2021-09-18 NOTE — Progress Notes (Signed)
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  He ultimately underwent an uncomplicated HOLEP in May 2022 with removal of 100 g of benign tissue.  He has been voiding well since that time and continues to urinate with a strong stream.  He denies any dysuria.  He is having minimal incontinence.  He wears a pad for safety primarily overnight.  PVR is normal today at 50 mL.  RTC 1 year PVR, likely can follow-up as needed at that point if doing well   B[place]ley Co, [place] [address], [place], [place] [address] [phone]

## 2021-09-19 ENCOUNTER — Inpatient Hospital Stay: Payer: Medicare Other

## 2021-09-19 ENCOUNTER — Inpatient Hospital Stay (HOSPITAL_BASED_OUTPATIENT_CLINIC_OR_DEPARTMENT_OTHER): Payer: Medicare Other | Admitting: Internal Medicine

## 2021-09-19 VITALS — BP 150/72 | HR 56 | Resp 16

## 2021-09-19 DIAGNOSIS — N183 Chronic kidney disease, stage 3 unspecified: Secondary | ICD-10-CM | POA: Diagnosis not present

## 2021-09-19 DIAGNOSIS — C7931 Secondary malignant neoplasm of brain: Secondary | ICD-10-CM

## 2021-09-19 DIAGNOSIS — R972 Elevated prostate specific antigen [PSA]: Secondary | ICD-10-CM | POA: Diagnosis not present

## 2021-09-19 DIAGNOSIS — C3412 Malignant neoplasm of upper lobe, left bronchus or lung: Secondary | ICD-10-CM

## 2021-09-19 DIAGNOSIS — C770 Secondary and unspecified malignant neoplasm of lymph nodes of head, face and neck: Secondary | ICD-10-CM | POA: Diagnosis not present

## 2021-09-19 DIAGNOSIS — Z5112 Encounter for antineoplastic immunotherapy: Secondary | ICD-10-CM | POA: Diagnosis not present

## 2021-09-19 LAB — CBC WITH DIFFERENTIAL/PLATELET
Abs Immature Granulocytes: 0.09 10*3/uL — ABNORMAL HIGH (ref 0.00–0.07)
Basophils Absolute: 0 10*3/uL (ref 0.0–0.1)
Basophils Relative: 1 %
Eosinophils Absolute: 0.1 10*3/uL (ref 0.0–0.5)
Eosinophils Relative: 1 %
HCT: 49 % (ref 39.0–52.0)
Hemoglobin: 16.1 g/dL (ref 13.0–17.0)
Immature Granulocytes: 1 %
Lymphocytes Relative: 29 %
Lymphs Abs: 1.9 10*3/uL (ref 0.7–4.0)
MCH: 27.6 pg (ref 26.0–34.0)
MCHC: 32.9 g/dL (ref 30.0–36.0)
MCV: 83.9 fL (ref 80.0–100.0)
Monocytes Absolute: 0.8 10*3/uL (ref 0.1–1.0)
Monocytes Relative: 13 %
Neutro Abs: 3.6 10*3/uL (ref 1.7–7.7)
Neutrophils Relative %: 55 %
Platelets: 102 10*3/uL — ABNORMAL LOW (ref 150–400)
RBC: 5.84 MIL/uL — ABNORMAL HIGH (ref 4.22–5.81)
RDW: 18 % — ABNORMAL HIGH (ref 11.5–15.5)
WBC: 6.5 10*3/uL (ref 4.0–10.5)
nRBC: 0 % (ref 0.0–0.2)

## 2021-09-19 LAB — COMPREHENSIVE METABOLIC PANEL
ALT: 38 U/L (ref 0–44)
AST: 18 U/L (ref 15–41)
Albumin: 3.3 g/dL — ABNORMAL LOW (ref 3.5–5.0)
Alkaline Phosphatase: 62 U/L (ref 38–126)
Anion gap: 4 — ABNORMAL LOW (ref 5–15)
BUN: 22 mg/dL (ref 8–23)
CO2: 26 mmol/L (ref 22–32)
Calcium: 8.7 mg/dL — ABNORMAL LOW (ref 8.9–10.3)
Chloride: 99 mmol/L (ref 98–111)
Creatinine, Ser: 1.01 mg/dL (ref 0.61–1.24)
GFR, Estimated: >60 mL/min (ref >60)
Glucose, Bld: 254 mg/dL — ABNORMAL HIGH (ref 70–99)
Potassium: 4 mmol/L (ref 3.5–5.1)
Sodium: 129 mmol/L — ABNORMAL LOW (ref 135–145)
Total Bilirubin: 0.5 mg/dL (ref 0.3–1.2)
Total Protein: 6.4 g/dL — ABNORMAL LOW (ref 6.5–8.1)

## 2021-09-19 MED ORDER — SODIUM CHLORIDE 0.9 % IV SOLN
15.0000 mg/kg | Freq: Once | INTRAVENOUS | Status: AC
  Administered 2021-09-19: 1000 mg via INTRAVENOUS
  Filled 2021-09-19: qty 32

## 2021-09-19 MED ORDER — SODIUM CHLORIDE 0.9 % IV SOLN
Freq: Once | INTRAVENOUS | Status: AC
  Filled 2021-09-19: qty 250

## 2021-09-19 NOTE — Progress Notes (Signed)
Last urine protein was 08/22/21. MD notified. Per Dr. Rogue Bussing ok to proceed with Avastin today.

## 2021-09-19 NOTE — Progress Notes (Signed)
Patient here for follow up. Family member reports since patient started taking [place] he has experienced "bedwetting" and trouble holding his urine. Also reports increase in confusion. Patient also c/o edema in bilateral feet that comes and goes starting 09/15/21.

## 2021-09-19 NOTE — Patient Instructions (Signed)
[facility] [place] CANCER CTR AT [place]  Discharge Instructions: Thank you for choosing [facility] to provide your oncology and hematology care.  If you have a lab appointment with the [facility], please go directly to the [facility] and check in at the registration area.  We strive to give you quality time with your provider. You may need to reschedule your appointment if you arrive late (15 or more minutes).  Arriving late affects you and other patients whose appointments are after yours.  Also, if you miss three or more appointments without notifying the office, you may be dismissed from the clinic at the providers discretion.      For prescription refill requests, have your pharmacy contact our office and allow 72 hours for refills to be completed.    To help prevent nausea and vomiting after your treatment, we encourage you to take your nausea medication as directed.  BELOW ARE SYMPTOMS THAT SHOULD BE REPORTED IMMEDIATELY: *FEVER GREATER THAN 100.4 F (38 C) OR HIGHER *CHILLS OR SWEATING *NAUSEA AND VOMITING THAT IS NOT CONTROLLED WITH YOUR NAUSEA MEDICATION *UNUSUAL SHORTNESS OF BREATH *UNUSUAL BRUISING OR BLEEDING *URINARY PROBLEMS (pain or burning when urinating, or frequent urination) *BOWEL PROBLEMS (unusual diarrhea, constipation, pain near the anus) TENDERNESS IN MOUTH AND THROAT WITH OR WITHOUT PRESENCE OF ULCERS (sore throat, sores in mouth, or a toothache) UNUSUAL RASH, SWELLING OR PAIN  UNUSUAL VAGINAL DISCHARGE OR ITCHING   Items with * indicate a potential emergency and should be followed up as soon as possible or go to the Emergency Department if any problems should occur.  Please show the CHEMOTHERAPY ALERT CARD or IMMUNOTHERAPY ALERT CARD at check-in to the Emergency Department and triage nurse.  Should you have questions after your visit or need to cancel or reschedule your appointment, please contact [facility] CANCER [place] AT [place]  [phone] and follow the prompts.  Office hours are 8:00 a.m. to 4:30 p.m. Monday - Friday. Please note that voicemails left after 4:00 p.m. may not be returned until the following business day.  We are closed weekends and major holidays. You have access to a nurse at all times for urgent questions. Please call the main number to the clinic [phone] and follow the prompts.  For any non-urgent questions, you may also contact your provider using MyChart. We now offer e-Visits for anyone 69 and older to request care online for non-urgent symptoms. For details visit [email or web address].   Also download the MyChart app! Go to the app store, search "MyChart", open the app, select [facility], and log in with your MyChart username and password.  Due to Covid, a mask is required upon entering the hospital/clinic. If you do not have a mask, one will be given to you upon arrival. For doctor visits, patients may have 1 support person aged 88 or older with them. For treatment visits, patients cannot have anyone with them due to current Covid guidelines and our immunocompromised population.

## 2021-09-19 NOTE — Progress Notes (Signed)
Plan for Bev 10mg /kg q2w entered in 2020.  Given for 6 cycles then restarted on 10mg /kg in December 2022.  New orders for 15mg /kg to be given every 3 weeks entered today - verified with MD.  Plan name not updated.

## 2021-09-19 NOTE — Assessment & Plan Note (Addendum)
#  Brain metastases - MAY 2022-s/p WBRT- MRI brain DEC 2022-progressive disease Vs. Radiation necrosis- currently on Avastin #2 q 3W. Plan #3 on on FEB 3rd.  Awaiting MRI on JAN 6th [after 3 cycles].  Discussed with Dr. Mickeal Skinner.  Discontinue Lobrena because of confusion and blurry vision/bedwetting.   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  Awaiting CT CAP on Jan 18th, 2023. STOP lobrena- see above.   # Bilateral PE & left lower extremity DVT:s/p IVC filter; currently on eliquis 2.5 g BID [5/13]-stable  # CKD stage III-creatinine 1.5 [GFR~51]-stable  # Peripheral Neuropathy- STABLE;  DM;continue  gabapentin 200 mg qhs-stable  # DISPOSITION:  # Avastin today # follow up in 3 weeks-MD; labs cbc/cmp;UA- ;Avastin; -Dr.B  Cc; Dr.Sowles.

## 2021-09-19 NOTE — Patient Instructions (Signed)
#   stop lobrena-

## 2021-09-19 NOTE — Progress Notes (Signed)
[name] OFFICE PROGRESS NOTE  Patient Care Team: [name], [name], [name], [name], [facility] (Pharmacist) Ventura Sellers, MD as Consulting Physician (Psychiatry) Billey Co, MD as Consulting Physician (Urology)   Cancer Staging  No matching staging information was found for the patient.   Oncology History Overview Note  # OCT 2017- ADENO CA LUNG; STAGE IV [; LUL; bil supraclavicular LN; Left neck LN Bx]; ROS-1 MUTATED; s/p Carbo-alimta x1[oct 2017]  # NOV 1st 2017- XALKORI 250 mg BID; JAN 15th CT- PR;  # AUG 27th Chemo-RT to persistent LUL/mediastinal LN [s/p carbo-taxol- with RT; finished Sep 22nd 2018];   # July 14th 2020- multiple metastatic lesions of the brain [July 30 whole brain radiation finished April 21, 2019]; bone scan skull metastases /posterior left ninth rib metastases; April 04, 2019 stopped crizotinib;  #April 25, 2019-start Entrectinib 600 mg once a day; STopped in NOV 2020 [dizziness]; NOV 2020-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  # DEC 1st 2020- start avastin q 2w; x6; MRI October 30, 2019-significantly improved left temporal lesion subcentimeter for stable lesions.;  [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   #June 2022-stopped lorlatinib [severe delirium/aggression]; currently on surveillance.  In the  interim patient also underwent-HOLEP on May 6.   # LLE DVT/bil PE/Multiple strokes [? 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  # MRI brain- multiple infarcts [2d echo/bubble study-NEG's/p Neurology eval]  ------------------------------------------------------------    # # s/p TURP [Sep 2017; Dr.Cope] DEC 26th CT- distended bladder;  # Elevated PSA-  FEB 2021- 12;  S/p Bx- Negative; follow with urology/Dr.Stoioff   # MOLECULAR TESTING- ROS-1 POSITIVE; ALK/EGFR-NEG; PDL-1 EXPRESSION- 90%** [HIGH]  # DEC 15th 2020- PALLIATIVE CARE -----------------------------------------------------------------    DIAGNOSIS: 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  Accompanied by his wife.  Melvin Willis 71 y.o.  male pleasant patient above history of metastatic lung cancer-to brain Ros-1 positive-most recently progressed in the brain s/p whole brain radiation [May 2022]-noted to have recent progression on brain MRI December 2022.  Patient s/p evaluation with neuro-oncology-is currently on dexamethasone 1 mg a day/taper.  Also on Avastin s/p 1 treatment approximately 3 weeks ago.  Patient also started on lobrena 50 mg/day.  As per the wife patient overall is slightly improved; however continues to have episodes of confusion/bedwetting.  No falls.  Also complains of blurry vision.  No leg swelling but weight gain.  No falls.He continues with Eliquis.  Review of Systems  Constitutional:  Positive for malaise/fatigue. Negative for chills, diaphoresis, fever and weight loss.  HENT:  Negative for nosebleeds and sore throat.   Eyes:  Negative for double  vision.  Respiratory:  Negative for cough, hemoptysis, sputum production, shortness of breath and wheezing.   Cardiovascular:  Negative for chest pain (Left upper chest wall pain chronic), palpitations and orthopnea.  Gastrointestinal:  Negative for abdominal pain, blood in stool, constipation, diarrhea, heartburn and melena.  Genitourinary:  Negative for dysuria.  Musculoskeletal:  Positive for joint pain. Negative for back pain.  Skin: Negative.  Negative for itching and rash.  Neurological:  Positive for tingling. Negative for focal weakness and weakness.  Endo/Heme/Allergies:  Does not bruise/bleed easily.  Psychiatric/Behavioral:  Positive for memory loss. Negative for depression. The patient is not nervous/anxious and does not have insomnia.    PAST MEDICAL HISTORY :  Past Medical History:  Diagnosis Date   Abnormal prostate specific antigen 08/08/2012   Adiposity 04/16/2015   Chronic kidney disease (CKD), stage III (moderate) ([place]) 11/27/2016   CVA (cerebral vascular accident) ([place]) 06/17/2016   Diabetes mellitus without complication ([place])    Diverticulosis of sigmoid colon 04/16/2015   Dyslipidemia 03/18/2015   Hemorrhoids, internal 04/16/2015   Hypercholesteremia 04/16/2015   Hyperlipidemia    Hypertension    Primary cancer of left upper lobe of lung ([place])    Pulmonary embolism ([place])    Wears dentures    partial upper    PAST SURGICAL HISTORY :   Past Surgical History:  Procedure Laterality Date   COLONOSCOPY     COLONOSCOPY WITH PROPOFOL N/A 05/06/2015   Procedure: COLONOSCOPY WITH PROPOFOL;  Surgeon: Lucilla Lame, MD;  Location: [place];  Service: Endoscopy;  Laterality: N/A;  ASCENDING COLON POLYPS X 2 TERMINAL ILEUM BIOPSY RANDOM COLON BX. TRANSVERSE COLON POLYP SIGMOID COLON POLYP   ESOPHAGOGASTRODUODENOSCOPY (EGD) WITH PROPOFOL N/A 05/06/2015   Procedure: ESOPHAGOGASTRODUODENOSCOPY (EGD) WITH PROPOFOL;  Surgeon: Lucilla Lame, MD;  Location: [place];   Service: Endoscopy;  Laterality: N/A;  GASTRIC BIOPSY X1   HOLEP-LASER ENUCLEATION OF THE PROSTATE WITH MORCELLATION N/A 01/10/2021   Procedure: HOLEP-LASER ENUCLEATION OF THE PROSTATE WITH MORCELLATION;  Surgeon: Billey Co, MD;  Location: [facility];  Service: Urology;  Laterality: N/A;   IVC FILTER INSERTION N/A 12/29/2020   Procedure: IVC FILTER INSERTION;  Surgeon: Dwana Curd, MD;  Location: [place] [facility];  Service: Cardiovascular;  Laterality: N/A;   KIDNEY STONE SURGERY  2017    FAMILY HISTORY :   Family History  Problem Relation Age of Onset   Diabetes Mother    Diabetes Father    CAD Father    Dementia Father    Diabetes Sister    Cancer Maternal Uncle        Prostate   Cancer Cousin        prostate    SOCIAL HISTORY:   Social History   Tobacco Use   Smoking status: Never   Smokeless tobacco: Never   Tobacco comments:    Smoking cessation materials not required  Vaping Use   Vaping Use: Never used  Substance Use Topics   Alcohol use: No    Alcohol/week: 0.0 standard drinks   Drug use: No    ALLERGIES:  has No Known Allergies.  MEDICATIONS:  Current Outpatient Medications  Medication Sig Dispense Refill   apixaban (ELIQUIS) 2.5 MG TABS tablet Take 1 tablet by mouth twice daily 60 tablet 3   atorvastatin (LIPITOR) 40 MG tablet Take 1 tablet by mouth once daily 90 tablet 0  blood glucose meter kit and supplies Dispense based on patient and insurance preference (Accuchek Aviva Plus, Accuchek Nano). Use up to four times daily as directed. (FOR ICD-10 E10.9, E11.9). 1 each 0   dexamethasone (DECADRON) 1 MG tablet Take 1 tablet (1 mg total) by mouth daily. 60 tablet 1   gabapentin (NEURONTIN) 300 MG capsule Take 1 capsule by mouth at bedtime 90 capsule 2   Insulin Pen Needle (NOVOFINE PEN NEEDLE) 32G X 6 MM MISC 1 each by Does not apply route daily. 100 each 2   insulin regular human CONCENTRATED (HUMULIN R U-500 KWIKPEN) 500 UNIT/ML KwikPen Inject  1-10 Units into the skin 3 (three) times daily with meals. Pre-meal insulin :  Check fsbs prior to meals  Hold if below 130 If glucose 130- 140 give 2 units If glucose 140 to 180 give 4 units  If glucose 180 to 250 give 6 units If glucose above 250 give 8 units 15 mL 0   levETIRAcetam (KEPPRA) 500 MG tablet Take 1 tablet by mouth twice daily 60 tablet 0   lorlatinib (LORBRENA) 25 MG tablet Take 2 tablets (50 mg total) by mouth daily. 60 tablet 1   SOLIQUA 100-33 UNT-MCG/ML SOPN INJECT 15 TO 30 UNITS  SUBCUTANEOUSLY ONCE DAILY 15 mL 0   No current facility-administered medications for this visit.   Facility-Administered Medications Ordered in Other Visits  Medication Dose Route Frequency Provider Last Rate Last Admin   0.9 %  sodium chloride infusion   Intravenous Once Charlaine Dalton R, MD       bevacizumab-awwb (MVASI) 1,000 mg in sodium chloride 0.9 % 100 mL chemo infusion  15 mg/kg (Order-Specific) Intravenous Once Cammie Sickle, MD        PHYSICAL EXAMINATION: ECOG PERFORMANCE STATUS: 0 - Asymptomatic  BP (!) 156/84 (BP Location: Right Arm, Patient Position: Sitting)    Pulse (!) 55    Temp (!) 96.1 F (35.6 C) (Tympanic)    Resp 16    Wt 155 lb 9.6 oz (70.6 kg)    SpO2 100%    BMI 25.89 kg/m   Filed Weights   09/19/21 0936  Weight: 155 lb 9.6 oz (70.6 kg)    Physical Exam HENT:     Head: Normocephalic and atraumatic.     Mouth/Throat:     Pharynx: No oropharyngeal exudate.  Eyes:     Pupils: Pupils are equal, round, and reactive to light.  Cardiovascular:     Rate and Rhythm: Normal rate and regular rhythm.  Pulmonary:     Effort: No respiratory distress.     Breath sounds: No wheezing.     Comments: Decreased breath sounds bilaterally no wheeze or crackles. Abdominal:     General: Bowel sounds are normal. There is no distension.     Palpations: Abdomen is soft. There is no mass.     Tenderness: There is no abdominal tenderness. There is no guarding or  rebound.  Musculoskeletal:        General: No tenderness. Normal range of motion.     Cervical back: Normal range of motion and neck supple.  Skin:    General: Skin is warm.  Neurological:     Mental Status: He is alert and oriented to person, place, and time.  Psychiatric:        Mood and Affect: Affect normal.   LABORATORY DATA:  I have reviewed the data as listed    Component Value Date/Time   NA 129 (L)  09/19/2021 0912   NA 139 01/01/2016 1000   K 4.0 09/19/2021 0912   CL 99 09/19/2021 0912   CO2 26 09/19/2021 0912   GLUCOSE 254 (H) 09/19/2021 0912   BUN 22 09/19/2021 0912   BUN 13 01/01/2016 1000   CREATININE 1.01 09/19/2021 0912   CREATININE 1.20 01/09/2021 1226   CALCIUM 8.7 (L) 09/19/2021 0912   PROT 6.4 (L) 09/19/2021 0912   PROT 7.2 01/01/2016 1000   ALBUMIN 3.3 (L) 09/19/2021 0912   ALBUMIN 4.4 01/01/2016 1000   AST 18 09/19/2021 0912   ALT 38 09/19/2021 0912   ALKPHOS 62 09/19/2021 0912   BILITOT 0.5 09/19/2021 0912   BILITOT 0.4 01/01/2016 1000   GFRNONAA >60 09/19/2021 0912   GFRNONAA 62 01/09/2021 1226   GFRAA 72 01/09/2021 1226    No results found for: SPEP, UPEP  Lab Results  Component Value Date   WBC 6.5 09/19/2021   NEUTROABS 3.6 09/19/2021   HGB 16.1 09/19/2021   HCT 49.0 09/19/2021   MCV 83.9 09/19/2021   PLT 102 (L) 09/19/2021      Chemistry      Component Value Date/Time   NA 129 (L) 09/19/2021 0912   NA 139 01/01/2016 1000   K 4.0 09/19/2021 0912   CL 99 09/19/2021 0912   CO2 26 09/19/2021 0912   BUN 22 09/19/2021 0912   BUN 13 01/01/2016 1000   CREATININE 1.01 09/19/2021 0912   CREATININE 1.20 01/09/2021 1226      Component Value Date/Time   CALCIUM 8.7 (L) 09/19/2021 0912   ALKPHOS 62 09/19/2021 0912   AST 18 09/19/2021 0912   ALT 38 09/19/2021 0912   BILITOT 0.5 09/19/2021 0912   BILITOT 0.4 01/01/2016 1000       RADIOGRAPHIC STUDIES: I have personally reviewed the radiological images as listed and agreed with  the findings in the report. No results found.   ASSESSMENT & PLAN:  Primary cancer of left upper lobe of lung ([place]) #Brain metastases - MAY 2022-s/p WBRT- MRI brain DEC 2022-progressive disease Vs. Radiation necrosis- currently on Avastin #2. Plan #3 on on FEB 3rd.  Awaiting MRI on JAN 6th [after 3 cycles].  Discussed with Dr. Mickeal Skinner.  Discontinue Lobrena because of confusion and blurry vision/bedwetting.   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  Awaiting CT CAP on Jan 18th, 2023. STOP lobrena- see above.   # Bilateral PE & left lower extremity DVT:s/p IVC filter; currently on eliquis 2.5 g BID [5/13]-stable  # CKD stage III-creatinine 1.5 [GFR~51]-stable  # Peripheral Neuropathy- STABLE;  DM;continue  gabapentin 200 mg qhs-stable  # DISPOSITION:  # Avastin today # follow up in 3 weeks-MD; labs cbc/cmp;UA- ;Avastin; -Dr.B  Cc; Dr.Sowles.    No orders of the defined types were placed in this encounter.   All questions were answered. The patient knows to call the clinic with any problems, questions or concerns.      Cammie Sickle, MD 09/19/2021 10:44 AM

## 2021-09-22 LAB — PROTEIN ELECTRO, RANDOM URINE
Albumin ELP, Urine: 25 %
Alpha-1-Globulin, U: 4.9 %
Alpha-2-Globulin, U: 14.1 %
Beta Globulin, U: 27.9 %
Gamma Globulin, U: 28.2 %
M Component, Ur: 10.7 % — ABNORMAL HIGH
Total Protein, Urine: 17.9 mg/dL

## 2021-09-24 ENCOUNTER — Other Ambulatory Visit: Payer: Self-pay

## 2021-09-24 ENCOUNTER — Ambulatory Visit
Admission: RE | Admit: 2021-09-24 | Discharge: 2021-09-24 | Disposition: A | Discharge status: Home / Self Care | Payer: Medicare Other | Source: Ambulatory Visit | Attending: Internal Medicine | Admitting: Internal Medicine

## 2021-09-24 ENCOUNTER — Ambulatory Visit: Payer: Medicare Other

## 2021-09-24 DIAGNOSIS — C3490 Malignant neoplasm of unspecified part of unspecified bronchus or lung: Secondary | ICD-10-CM | POA: Diagnosis not present

## 2021-09-24 DIAGNOSIS — Z85841 Personal history of malignant neoplasm of brain: Secondary | ICD-10-CM | POA: Diagnosis not present

## 2021-09-24 DIAGNOSIS — N281 Cyst of kidney, acquired: Secondary | ICD-10-CM | POA: Diagnosis not present

## 2021-09-24 DIAGNOSIS — Z85118 Personal history of other malignant neoplasm of bronchus and lung: Secondary | ICD-10-CM | POA: Diagnosis not present

## 2021-09-24 DIAGNOSIS — K7689 Other specified diseases of liver: Secondary | ICD-10-CM | POA: Diagnosis not present

## 2021-09-24 MED ORDER — IOHEXOL 300 MG/ML  SOLN
100.0000 mL | Freq: Once | INTRAMUSCULAR | Status: AC | PRN
  Administered 2021-09-24: 100 mL via INTRAVENOUS

## 2021-09-26 ENCOUNTER — Other Ambulatory Visit: Payer: Self-pay | Admitting: Internal Medicine

## 2021-09-26 ENCOUNTER — Inpatient Hospital Stay: Payer: Medicare Other

## 2021-09-26 ENCOUNTER — Other Ambulatory Visit: Payer: Self-pay

## 2021-09-26 ENCOUNTER — Inpatient Hospital Stay (HOSPITAL_BASED_OUTPATIENT_CLINIC_OR_DEPARTMENT_OTHER): Payer: Medicare Other | Admitting: Internal Medicine

## 2021-09-26 DIAGNOSIS — N183 Chronic kidney disease, stage 3 unspecified: Secondary | ICD-10-CM | POA: Diagnosis not present

## 2021-09-26 DIAGNOSIS — C3412 Malignant neoplasm of upper lobe, left bronchus or lung: Secondary | ICD-10-CM

## 2021-09-26 DIAGNOSIS — C7931 Secondary malignant neoplasm of brain: Secondary | ICD-10-CM | POA: Diagnosis not present

## 2021-09-26 DIAGNOSIS — N1832 Chronic kidney disease, stage 3b: Secondary | ICD-10-CM

## 2021-09-26 DIAGNOSIS — R972 Elevated prostate specific antigen [PSA]: Secondary | ICD-10-CM | POA: Diagnosis not present

## 2021-09-26 DIAGNOSIS — C770 Secondary and unspecified malignant neoplasm of lymph nodes of head, face and neck: Secondary | ICD-10-CM | POA: Diagnosis not present

## 2021-09-26 DIAGNOSIS — Z5112 Encounter for antineoplastic immunotherapy: Secondary | ICD-10-CM | POA: Diagnosis not present

## 2021-09-26 LAB — CBC WITH DIFFERENTIAL/PLATELET
Abs Immature Granulocytes: 0.04 10*3/uL (ref 0.00–0.07)
Basophils Absolute: 0 10*3/uL (ref 0.0–0.1)
Basophils Relative: 0 %
Eosinophils Absolute: 0 10*3/uL (ref 0.0–0.5)
Eosinophils Relative: 1 %
HCT: 49.9 % (ref 39.0–52.0)
Hemoglobin: 16.7 g/dL (ref 13.0–17.0)
Immature Granulocytes: 1 %
Lymphocytes Relative: 28 %
Lymphs Abs: 1.3 10*3/uL (ref 0.7–4.0)
MCH: 28 pg (ref 26.0–34.0)
MCHC: 33.5 g/dL (ref 30.0–36.0)
MCV: 83.7 fL (ref 80.0–100.0)
Monocytes Absolute: 0.5 10*3/uL (ref 0.1–1.0)
Monocytes Relative: 11 %
Neutro Abs: 2.7 10*3/uL (ref 1.7–7.7)
Neutrophils Relative %: 59 %
Platelets: 127 10*3/uL — ABNORMAL LOW (ref 150–400)
RBC: 5.96 MIL/uL — ABNORMAL HIGH (ref 4.22–5.81)
RDW: 17.2 % — ABNORMAL HIGH (ref 11.5–15.5)
WBC: 4.7 10*3/uL (ref 4.0–10.5)
nRBC: 0 % (ref 0.0–0.2)

## 2021-09-26 LAB — COMPREHENSIVE METABOLIC PANEL
ALT: 64 U/L — ABNORMAL HIGH (ref 0–44)
AST: 27 U/L (ref 15–41)
Albumin: 3.8 g/dL (ref 3.5–5.0)
Alkaline Phosphatase: 74 U/L (ref 38–126)
Anion gap: 9 (ref 5–15)
BUN: 16 mg/dL (ref 8–23)
CO2: 25 mmol/L (ref 22–32)
Calcium: 9.5 mg/dL (ref 8.9–10.3)
Chloride: 102 mmol/L (ref 98–111)
Creatinine, Ser: 1.16 mg/dL (ref 0.61–1.24)
GFR, Estimated: >60 mL/min (ref >60)
Glucose, Bld: 218 mg/dL — ABNORMAL HIGH (ref 70–99)
Potassium: 3.9 mmol/L (ref 3.5–5.1)
Sodium: 136 mmol/L (ref 135–145)
Total Bilirubin: 0.6 mg/dL (ref 0.3–1.2)
Total Protein: 7.1 g/dL (ref 6.5–8.1)

## 2021-09-26 MED ORDER — AMLODIPINE BESYLATE 5 MG PO TABS: 5.0000 mg | ORAL_TABLET | Freq: Every day | ORAL | 3 refills | Status: DC

## 2021-09-26 NOTE — Patient Instructions (Signed)
#  Recommend checking blood pressure on a daily basis.  Bring a log of blood pressures at next visit.  Recommend amlodipine once a day/take it at night.Marland Kitchen

## 2021-09-26 NOTE — Progress Notes (Signed)
[facility] OFFICE PROGRESS NOTE  Patient Care Team: [name], [name], [name], [name], [facility] (Pharmacist) Ventura Sellers, MD as Consulting Physician (Psychiatry) Billey Co, MD as Consulting Physician (Urology)   Cancer Staging  No matching staging information was found for the patient.   Oncology History Overview Note  # OCT 2017- ADENO CA LUNG; STAGE IV [; LUL; bil supraclavicular LN; Left neck LN Bx]; ROS-1 MUTATED; s/p Carbo-alimta x1[oct 2017]  # NOV 1st 2017- XALKORI 250 mg BID; JAN 15th CT- PR;  # AUG 27th Chemo-RT to persistent LUL/mediastinal LN [s/p carbo-taxol- with RT; finished Sep 22nd 2018];   # July 14th 2020- multiple metastatic lesions of the brain [July 30 whole brain radiation finished April 21, 2019]; bone scan skull metastases /posterior left ninth rib metastases; April 04, 2019 stopped crizotinib;  #April 25, 2019-start Entrectinib 600 mg once a day; STopped in NOV 2020 [dizziness]; NOV 2020-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  # DEC 1st 2020- start avastin q 2w; x6; MRI October 30, 2019-significantly improved left temporal lesion subcentimeter for stable lesions.;  [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   #June 2022-stopped lorlatinib [severe delirium/aggression]; currently on surveillance.  In the  interim patient also underwent-HOLEP on May 6.   # LLE DVT/bil PE/Multiple strokes [? 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  # MRI brain- multiple infarcts [2d echo/bubble study-NEG's/p Neurology eval]  ------------------------------------------------------------    # # s/p TURP [Sep 2017; Dr.Cope] DEC 26th CT- distended bladder;  # Elevated PSA-  FEB 2021- 12;  S/p Bx- Negative; follow with urology/Dr.Stoioff   # MOLECULAR TESTING- ROS-1 POSITIVE; ALK/EGFR-NEG; PDL-1 EXPRESSION- 90%** [HIGH]  # DEC 15th 2020- PALLIATIVE CARE -----------------------------------------------------------------    DIAGNOSIS: 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  Accompanied by his wife.  Melvin Willis 71 y.o.  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 radiation necrosis] currently on Avastin is here for follow-up./Review results of his CT scan.  He continues 1 dexamethasone 1 mg day taper.  Patient was taken off Lobrena at last visit because of confusion/bedwetting.  Since stopping symptoms have resolved.  As per the wife patient overall is slightly improved. No leg swelling but weight gain.  No falls.He continues with Eliquis.  Review of Systems  Constitutional:  Positive for malaise/fatigue. Negative for chills, diaphoresis, fever and weight loss.  HENT:  Negative for nosebleeds and sore throat.   Eyes:  Negative for double vision.  Respiratory:  Negative for cough, hemoptysis, sputum production, shortness of  breath and wheezing.   Cardiovascular:  Negative for  chest pain (Left upper chest wall pain chronic), palpitations and orthopnea.  Gastrointestinal:  Negative for abdominal pain, blood in stool, constipation, diarrhea, heartburn and melena.  Genitourinary:  Negative for dysuria.  Musculoskeletal:  Positive for joint pain. Negative for back pain.  Skin: Negative.  Negative for itching and rash.  Neurological:  Positive for tingling. Negative for focal weakness and weakness.  Endo/Heme/Allergies:  Does not bruise/bleed easily.  Psychiatric/Behavioral:  Positive for memory loss. Negative for depression. The patient is not nervous/anxious and does not have insomnia.    PAST MEDICAL HISTORY :  Past Medical History:  Diagnosis Date   Abnormal prostate specific antigen 08/08/2012   Adiposity 04/16/2015   Chronic kidney disease (CKD), stage III (moderate) ([place]) 11/27/2016   CVA (cerebral vascular accident) ([place]) 06/17/2016   Diabetes mellitus without complication ([place])    Diverticulosis of sigmoid colon 04/16/2015   Dyslipidemia 03/18/2015   Hemorrhoids, internal 04/16/2015   Hypercholesteremia 04/16/2015   Hyperlipidemia    Hypertension    Primary cancer of left upper lobe of lung ([place])    Pulmonary embolism ([place])    Wears dentures    partial upper    PAST SURGICAL HISTORY :   Past Surgical History:  Procedure Laterality Date   COLONOSCOPY     COLONOSCOPY WITH PROPOFOL N/A 05/06/2015   Procedure: COLONOSCOPY WITH PROPOFOL;  Surgeon: Lucilla Lame, MD;  Location: [place];  Service: Endoscopy;  Laterality: N/A;  ASCENDING COLON POLYPS X 2 TERMINAL ILEUM BIOPSY RANDOM COLON BX. TRANSVERSE COLON POLYP SIGMOID COLON POLYP   ESOPHAGOGASTRODUODENOSCOPY (EGD) WITH PROPOFOL N/A 05/06/2015   Procedure: ESOPHAGOGASTRODUODENOSCOPY (EGD) WITH PROPOFOL;  Surgeon: Lucilla Lame, MD;  Location: [place];  Service: Endoscopy;  Laterality: N/A;  GASTRIC BIOPSY X1   HOLEP-LASER ENUCLEATION OF THE PROSTATE WITH MORCELLATION N/A 01/10/2021    Procedure: HOLEP-LASER ENUCLEATION OF THE PROSTATE WITH MORCELLATION;  Surgeon: Billey Co, MD;  Location: [facility];  Service: Urology;  Laterality: N/A;   IVC FILTER INSERTION N/A 12/29/2020   Procedure: IVC FILTER INSERTION;  Surgeon: Dwana Curd, MD;  Location: [place] [facility];  Service: Cardiovascular;  Laterality: N/A;   KIDNEY STONE SURGERY  2017    FAMILY HISTORY :   Family History  Problem Relation Age of Onset   Diabetes Mother    Diabetes Father    CAD Father    Dementia Father    Diabetes Sister    Cancer Maternal Uncle        Prostate   Cancer Cousin        prostate    SOCIAL HISTORY:   Social History   Tobacco Use   Smoking status: Never   Smokeless tobacco: Never   Tobacco comments:    Smoking cessation materials not required  Vaping Use   Vaping Use: Never used  Substance Use Topics   Alcohol use: No    Alcohol/week: 0.0 standard drinks   Drug use: No    ALLERGIES:  has No Known Allergies.  MEDICATIONS:  Current Outpatient Medications  Medication Sig Dispense Refill   amLODipine (NORVASC) 5 MG tablet Take 1 tablet (5 mg total) by mouth daily. 30 tablet 3   apixaban (ELIQUIS) 2.5 MG TABS tablet Take 1 tablet by mouth twice daily 60 tablet 3   atorvastatin (LIPITOR) 40 MG tablet Take 1 tablet by mouth once daily 90 tablet 0   blood glucose meter kit and supplies  Dispense based on patient and insurance preference (Accuchek Aviva Plus, Accuchek Nano). Use up to four times daily as directed. (FOR ICD-10 E10.9, E11.9). 1 each 0   dexamethasone (DECADRON) 1 MG tablet Take 1 tablet (1 mg total) by mouth daily. 60 tablet 1   gabapentin (NEURONTIN) 300 MG capsule Take 1 capsule by mouth at bedtime 90 capsule 2   Insulin Pen Needle (NOVOFINE PEN NEEDLE) 32G X 6 MM MISC 1 each by Does not apply route daily. 100 each 2   insulin regular human CONCENTRATED (HUMULIN R U-500 KWIKPEN) 500 UNIT/ML KwikPen Inject 1-10 Units into the skin 3 (three) times  daily with meals. Pre-meal insulin :  Check fsbs prior to meals  Hold if below 130 If glucose 130- 140 give 2 units If glucose 140 to 180 give 4 units  If glucose 180 to 250 give 6 units If glucose above 250 give 8 units 15 mL 0   levETIRAcetam (KEPPRA) 500 MG tablet Take 1 tablet by mouth twice daily 60 tablet 0   SOLIQUA 100-33 UNT-MCG/ML SOPN INJECT 15 TO 30 UNITS  SUBCUTANEOUSLY ONCE DAILY 15 mL 0   lorlatinib (LORBRENA) 25 MG tablet Take 2 tablets (50 mg total) by mouth daily. (Patient not taking: Reported on 09/26/2021) 60 tablet 1   No current facility-administered medications for this visit.    PHYSICAL EXAMINATION: ECOG PERFORMANCE STATUS: 0 - Asymptomatic  BP (!) 161/90    Pulse (!) 57    Temp (!) 96.4 F (35.8 C)    Resp 16    Wt 156 lb 12.8 oz (71.1 kg)    BMI 26.09 kg/m   Filed Weights   09/26/21 1006  Weight: 156 lb 12.8 oz (71.1 kg)    Physical Exam HENT:     Head: Normocephalic and atraumatic.     Mouth/Throat:     Pharynx: No oropharyngeal exudate.  Eyes:     Pupils: Pupils are equal, round, and reactive to light.  Cardiovascular:     Rate and Rhythm: Normal rate and regular rhythm.  Pulmonary:     Effort: No respiratory distress.     Breath sounds: No wheezing.     Comments: Decreased breath sounds bilaterally no wheeze or crackles. Abdominal:     General: Bowel sounds are normal. There is no distension.     Palpations: Abdomen is soft. There is no mass.     Tenderness: There is no abdominal tenderness. There is no guarding or rebound.  Musculoskeletal:        General: No tenderness. Normal range of motion.     Cervical back: Normal range of motion and neck supple.  Skin:    General: Skin is warm.  Neurological:     Mental Status: He is alert and oriented to person, place, and time.  Psychiatric:        Mood and Affect: Affect normal.   LABORATORY DATA:  I have reviewed the data as listed    Component Value Date/Time   NA 136 09/26/2021 0940    NA 139 01/01/2016 1000   K 3.9 09/26/2021 0940   CL 102 09/26/2021 0940   CO2 25 09/26/2021 0940   GLUCOSE 218 (H) 09/26/2021 0940   BUN 16 09/26/2021 0940   BUN 13 01/01/2016 1000   CREATININE 1.16 09/26/2021 0940   CREATININE 1.20 01/09/2021 1226   CALCIUM 9.5 09/26/2021 0940   PROT 7.1 09/26/2021 0940   PROT 7.2 01/01/2016 1000   ALBUMIN 3.8 09/26/2021 0940  ALBUMIN 4.4 01/01/2016 1000   AST 27 09/26/2021 0940   ALT 64 (H) 09/26/2021 0940   ALKPHOS 74 09/26/2021 0940   BILITOT 0.6 09/26/2021 0940   BILITOT 0.4 01/01/2016 1000   GFRNONAA >60 09/26/2021 0940   GFRNONAA 62 01/09/2021 1226   GFRAA 72 01/09/2021 1226    No results found for: SPEP, UPEP  Lab Results  Component Value Date   WBC 4.7 09/26/2021   NEUTROABS 2.7 09/26/2021   HGB 16.7 09/26/2021   HCT 49.9 09/26/2021   MCV 83.7 09/26/2021   PLT 127 (L) 09/26/2021      Chemistry      Component Value Date/Time   NA 136 09/26/2021 0940   NA 139 01/01/2016 1000   K 3.9 09/26/2021 0940   CL 102 09/26/2021 0940   CO2 25 09/26/2021 0940   BUN 16 09/26/2021 0940   BUN 13 01/01/2016 1000   CREATININE 1.16 09/26/2021 0940   CREATININE 1.20 01/09/2021 1226      Component Value Date/Time   CALCIUM 9.5 09/26/2021 0940   ALKPHOS 74 09/26/2021 0940   AST 27 09/26/2021 0940   ALT 64 (H) 09/26/2021 0940   BILITOT 0.6 09/26/2021 0940   BILITOT 0.4 01/01/2016 1000       RADIOGRAPHIC STUDIES: I have personally reviewed the radiological images as listed and agreed with the findings in the report. No results found.   ASSESSMENT & PLAN:  Primary cancer of left upper lobe of lung ([place]) #Brain metastases - MAY 2022-s/p WBRT- MRI brain DEC 2022-progressive disease Vs. Radiation necrosis- currently on Avastin #2 q 3W.  Plan #3 on on FEB 3rd.  Awaiting MRI on FEB 6th [after 3 cycles].  Discussed with Dr. Mickeal Skinner.  Discontinued Christophe Louis because of confusion and bedwetting; currently resolved.   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  We will plan CT chest-ordered next visit.    # Elevated BP- ? Avastin; recommend checking at home; add amlodipine 5 mg day.   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  # Peripheral Neuropathy- STABLE;  DM;continue  gabapentin 200 mg qhs-stable  # DISPOSITION:   # follow up as scheduled/ planned in 2 weeks--MD; labs cbc/cmp;UA- ;Avastin; -Dr.B  # I reviewed the blood work- with the patient in detail; also reviewed the imaging independently [as summarized above]; and with the patient in detail.   Cc; Dr.Sowles.    Orders Placed This Encounter  Procedures   Protein / creatinine ratio, urine    Standing Status:   Future    Standing Expiration Date:   09/26/2022    All questions were answered. The patient knows to call the clinic with any problems, questions or concerns.      Cammie Sickle, MD 09/30/2021 8:32 AM

## 2021-09-26 NOTE — Assessment & Plan Note (Addendum)
#  Brain metastases - MAY 2022-s/p WBRT- MRI brain DEC 2022-progressive disease Vs. Radiation necrosis- currently on Avastin #2 q 3W.  Plan #3 on on FEB 3rd.  Awaiting MRI on FEB 6th [after 3 cycles].  Discussed with Dr. Mickeal Skinner.  Discontinued Melvin Willis because of confusion and bedwetting; currently resolved.   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  We will plan CT chest-ordered next visit.  # Elevated BP- ? Avastin; recommend checking at home; add amlodipine 5 mg day.   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  # Peripheral Neuropathy- STABLE;  DM;continue  gabapentin 200 mg qhs-stable  # DISPOSITION:   # follow up as scheduled/ planned in 2 weeks--MD; labs cbc/cmp;UA- ;Avastin; -Dr.B  # I reviewed the blood work- with the patient in detail; also reviewed the imaging independently [as summarized above]; and with the patient in detail.   Cc; Dr.Sowles.

## 2021-09-26 NOTE — Progress Notes (Signed)
Patient reports episodes of blurred vision.  BP 161/90.

## 2021-09-29 ENCOUNTER — Inpatient Hospital Stay: Payer: Medicare Other

## 2021-09-29 LAB — KAPPA/LAMBDA LIGHT CHAINS
Kappa free light chain: 86.9 mg/L — ABNORMAL HIGH (ref 3.3–19.4)
Kappa, lambda light chain ratio: 4.83 — ABNORMAL HIGH (ref 0.26–1.65)
Lambda free light chains: 18 mg/L (ref 5.7–26.3)

## 2021-09-30 ENCOUNTER — Other Ambulatory Visit: Payer: Self-pay | Admitting: *Deleted

## 2021-09-30 ENCOUNTER — Encounter: Payer: Self-pay | Admitting: Internal Medicine

## 2021-09-30 DIAGNOSIS — I82403 Acute embolism and thrombosis of unspecified deep veins of lower extremity, bilateral: Secondary | ICD-10-CM

## 2021-09-30 LAB — MULTIPLE MYELOMA PANEL, SERUM
Albumin SerPl Elph-Mcnc: 3.1 g/dL (ref 2.9–4.4)
Albumin/Glob SerPl: 0.9 (ref 0.7–1.7)
Alpha 1: 0.3 g/dL (ref 0.0–0.4)
Alpha2 Glob SerPl Elph-Mcnc: 0.8 g/dL (ref 0.4–1.0)
B-Globulin SerPl Elph-Mcnc: 1.5 g/dL — ABNORMAL HIGH (ref 0.7–1.3)
Gamma Glob SerPl Elph-Mcnc: 0.9 g/dL (ref 0.4–1.8)
Globulin, Total: 3.5 g/dL (ref 2.2–3.9)
IgA: 530 mg/dL — ABNORMAL HIGH (ref 61–437)
IgG (Immunoglobin G), Serum: 887 mg/dL (ref 603–1613)
IgM (Immunoglobulin M), Srm: 51 mg/dL (ref 20–172)
Total Protein ELP: 6.6 g/dL (ref 6.0–8.5)

## 2021-10-02 ENCOUNTER — Encounter: Payer: Self-pay | Admitting: Internal Medicine

## 2021-10-02 MED ORDER — APIXABAN 2.5 MG PO TABS: 2.5000 mg | ORAL_TABLET | Freq: Two times a day (BID) | ORAL | 3 refills | Status: DC

## 2021-10-09 ENCOUNTER — Ambulatory Visit: Payer: Medicare Other | Admitting: Dermatology

## 2021-10-10 ENCOUNTER — Encounter: Payer: Self-pay | Admitting: Internal Medicine

## 2021-10-10 ENCOUNTER — Inpatient Hospital Stay: Payer: Medicare Other | Attending: Internal Medicine

## 2021-10-10 ENCOUNTER — Inpatient Hospital Stay: Payer: Medicare Other

## 2021-10-10 ENCOUNTER — Inpatient Hospital Stay (HOSPITAL_BASED_OUTPATIENT_CLINIC_OR_DEPARTMENT_OTHER): Payer: Medicare Other | Admitting: Internal Medicine

## 2021-10-10 ENCOUNTER — Other Ambulatory Visit: Payer: Self-pay

## 2021-10-10 DIAGNOSIS — R972 Elevated prostate specific antigen [PSA]: Secondary | ICD-10-CM | POA: Insufficient documentation

## 2021-10-10 DIAGNOSIS — Z833 Family history of diabetes mellitus: Secondary | ICD-10-CM | POA: Insufficient documentation

## 2021-10-10 DIAGNOSIS — M2550 Pain in unspecified joint: Secondary | ICD-10-CM | POA: Insufficient documentation

## 2021-10-10 DIAGNOSIS — C7931 Secondary malignant neoplasm of brain: Secondary | ICD-10-CM

## 2021-10-10 DIAGNOSIS — Z8042 Family history of malignant neoplasm of prostate: Secondary | ICD-10-CM | POA: Insufficient documentation

## 2021-10-10 DIAGNOSIS — N3944 Nocturnal enuresis: Secondary | ICD-10-CM | POA: Insufficient documentation

## 2021-10-10 DIAGNOSIS — Z8673 Personal history of transient ischemic attack (TIA), and cerebral infarction without residual deficits: Secondary | ICD-10-CM | POA: Insufficient documentation

## 2021-10-10 DIAGNOSIS — Z86711 Personal history of pulmonary embolism: Secondary | ICD-10-CM | POA: Insufficient documentation

## 2021-10-10 DIAGNOSIS — Z79899 Other long term (current) drug therapy: Secondary | ICD-10-CM | POA: Insufficient documentation

## 2021-10-10 DIAGNOSIS — Z818 Family history of other mental and behavioral disorders: Secondary | ICD-10-CM | POA: Insufficient documentation

## 2021-10-10 DIAGNOSIS — N1831 Chronic kidney disease, stage 3a: Secondary | ICD-10-CM | POA: Insufficient documentation

## 2021-10-10 DIAGNOSIS — E1142 Type 2 diabetes mellitus with diabetic polyneuropathy: Secondary | ICD-10-CM | POA: Insufficient documentation

## 2021-10-10 DIAGNOSIS — C7800 Secondary malignant neoplasm of unspecified lung: Secondary | ICD-10-CM | POA: Insufficient documentation

## 2021-10-10 DIAGNOSIS — Z7952 Long term (current) use of systemic steroids: Secondary | ICD-10-CM | POA: Insufficient documentation

## 2021-10-10 DIAGNOSIS — Z8249 Family history of ischemic heart disease and other diseases of the circulatory system: Secondary | ICD-10-CM | POA: Insufficient documentation

## 2021-10-10 DIAGNOSIS — I82402 Acute embolism and thrombosis of unspecified deep veins of left lower extremity: Secondary | ICD-10-CM | POA: Insufficient documentation

## 2021-10-10 DIAGNOSIS — Z5112 Encounter for antineoplastic immunotherapy: Secondary | ICD-10-CM | POA: Insufficient documentation

## 2021-10-10 DIAGNOSIS — C3412 Malignant neoplasm of upper lobe, left bronchus or lung: Secondary | ICD-10-CM | POA: Diagnosis not present

## 2021-10-10 DIAGNOSIS — Z7901 Long term (current) use of anticoagulants: Secondary | ICD-10-CM | POA: Insufficient documentation

## 2021-10-10 DIAGNOSIS — R5383 Other fatigue: Secondary | ICD-10-CM | POA: Insufficient documentation

## 2021-10-10 DIAGNOSIS — E1122 Type 2 diabetes mellitus with diabetic chronic kidney disease: Secondary | ICD-10-CM | POA: Diagnosis not present

## 2021-10-10 DIAGNOSIS — I2699 Other pulmonary embolism without acute cor pulmonale: Secondary | ICD-10-CM | POA: Insufficient documentation

## 2021-10-10 DIAGNOSIS — R413 Other amnesia: Secondary | ICD-10-CM | POA: Insufficient documentation

## 2021-10-10 DIAGNOSIS — Z8719 Personal history of other diseases of the digestive system: Secondary | ICD-10-CM | POA: Insufficient documentation

## 2021-10-10 DIAGNOSIS — N401 Enlarged prostate with lower urinary tract symptoms: Secondary | ICD-10-CM | POA: Insufficient documentation

## 2021-10-10 DIAGNOSIS — N183 Chronic kidney disease, stage 3 unspecified: Secondary | ICD-10-CM | POA: Insufficient documentation

## 2021-10-10 LAB — COMPREHENSIVE METABOLIC PANEL
ALT: 44 U/L (ref 0–44)
AST: 18 U/L (ref 15–41)
Albumin: 3.9 g/dL (ref 3.5–5.0)
Alkaline Phosphatase: 82 U/L (ref 38–126)
Anion gap: 8 (ref 5–15)
BUN: 22 mg/dL (ref 8–23)
CO2: 24 mmol/L (ref 22–32)
Calcium: 9.5 mg/dL (ref 8.9–10.3)
Chloride: 98 mmol/L (ref 98–111)
Creatinine, Ser: 1.16 mg/dL (ref 0.61–1.24)
GFR, Estimated: >60 mL/min (ref >60)
Glucose, Bld: 328 mg/dL — ABNORMAL HIGH (ref 70–99)
Potassium: 3.8 mmol/L (ref 3.5–5.1)
Sodium: 130 mmol/L — ABNORMAL LOW (ref 135–145)
Total Bilirubin: 0.5 mg/dL (ref 0.3–1.2)
Total Protein: 7.5 g/dL (ref 6.5–8.1)

## 2021-10-10 LAB — CBC WITH DIFFERENTIAL/PLATELET
Abs Immature Granulocytes: 0.07 10*3/uL (ref 0.00–0.07)
Basophils Absolute: 0 10*3/uL (ref 0.0–0.1)
Basophils Relative: 0 %
Eosinophils Absolute: 0 10*3/uL (ref 0.0–0.5)
Eosinophils Relative: 0 %
HCT: 51.9 % (ref 39.0–52.0)
Hemoglobin: 17.3 g/dL — ABNORMAL HIGH (ref 13.0–17.0)
Immature Granulocytes: 1 %
Lymphocytes Relative: 23 %
Lymphs Abs: 1.8 10*3/uL (ref 0.7–4.0)
MCH: 27.6 pg (ref 26.0–34.0)
MCHC: 33.3 g/dL (ref 30.0–36.0)
MCV: 82.8 fL (ref 80.0–100.0)
Monocytes Absolute: 0.8 10*3/uL (ref 0.1–1.0)
Monocytes Relative: 10 %
Neutro Abs: 4.9 10*3/uL (ref 1.7–7.7)
Neutrophils Relative %: 66 %
Platelets: 202 10*3/uL (ref 150–400)
RBC: 6.27 MIL/uL — ABNORMAL HIGH (ref 4.22–5.81)
RDW: 15.8 % — ABNORMAL HIGH (ref 11.5–15.5)
WBC: 7.6 10*3/uL (ref 4.0–10.5)
nRBC: 0 % (ref 0.0–0.2)

## 2021-10-10 LAB — PROTEIN / CREATININE RATIO, URINE
Creatinine, Urine: 116 mg/dL
Protein Creatinine Ratio: 0.27 mg/mg{Cre} — ABNORMAL HIGH (ref 0.00–0.15)
Total Protein, Urine: 31 mg/dL

## 2021-10-10 MED ORDER — SODIUM CHLORIDE 0.9 % IV SOLN
Freq: Once | INTRAVENOUS | Status: AC
  Filled 2021-10-10: qty 250

## 2021-10-10 MED ORDER — SODIUM CHLORIDE 0.9 % IV SOLN
15.0000 mg/kg | Freq: Once | INTRAVENOUS | Status: AC
  Administered 2021-10-10: 1000 mg via INTRAVENOUS
  Filled 2021-10-10: qty 32

## 2021-10-10 NOTE — Progress Notes (Signed)
[place] OFFICE PROGRESS NOTE  Patient Care Team: [name], [name], [name], [name], [facility] (Pharmacist) Ventura Sellers, MD as Consulting Physician (Psychiatry) Billey Co, MD as Consulting Physician (Urology)   Cancer Staging  No matching staging information was found for the patient.   Oncology History Overview Note  # OCT 2017- ADENO CA LUNG; STAGE IV [; LUL; bil supraclavicular LN; Left neck LN Bx]; ROS-1 MUTATED; s/p Carbo-alimta x1[oct 2017]  # NOV 1st 2017- XALKORI 250 mg BID; JAN 15th CT- PR;  # AUG 27th Chemo-RT to persistent LUL/mediastinal LN [s/p carbo-taxol- with RT; finished Sep 22nd 2018];   # July 14th 2020- multiple metastatic lesions of the brain [July 30 whole brain radiation finished April 21, 2019]; bone scan skull metastases /posterior left ninth rib metastases; April 04, 2019 stopped crizotinib;  #April 25, 2019-start Entrectinib 600 mg once a day; STopped in NOV 2020 [dizziness]; NOV 2020-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  # DEC 1st 2020- start avastin q 2w; x6; MRI October 30, 2019-significantly improved left temporal lesion subcentimeter for stable lesions.;  [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   #June 2022-stopped lorlatinib [severe delirium/aggression]; currently on surveillance.  In the  interim patient also underwent-HOLEP on May 6.   # LLE DVT/bil PE/Multiple strokes [? 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  # MRI brain- multiple infarcts [2d echo/bubble study-NEG's/p Neurology eval]  ------------------------------------------------------------    # # s/p TURP [Sep 2017; Dr.Cope] DEC 26th CT- distended bladder;  # Elevated PSA-  FEB 2021- 12;  S/p Bx- Negative; follow with urology/Dr.Stoioff   # MOLECULAR TESTING- ROS-1 POSITIVE; ALK/EGFR-NEG; PDL-1 EXPRESSION- 90%** [HIGH]  # DEC 15th 2020- PALLIATIVE CARE -----------------------------------------------------------------    DIAGNOSIS: 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  Accompanied by his wife.  Melvin Willis 71 y.o.  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 radiation necrosis] currently on Avastin is here for follow-up.  Patient denies any further episodes of confusion.  He continues to take dexamethasone 1 mg a day.  Blood sugars poorly controlled.  No leg swelling but weight gain.  No falls.He continues with Eliquis.  Review of Systems  Constitutional:  Positive for malaise/fatigue. Negative for chills, diaphoresis, fever and weight loss.  HENT:  Negative for nosebleeds and sore throat.   Eyes:  Negative for double vision.  Respiratory:  Negative for cough, hemoptysis, sputum production, shortness of breath and wheezing.   Cardiovascular:  Negative for chest pain (Left upper chest wall pain chronic), palpitations  and orthopnea.  Gastrointestinal:  Negative for abdominal  pain, blood in stool, constipation, diarrhea, heartburn and melena.  Genitourinary:  Negative for dysuria.  Musculoskeletal:  Positive for joint pain. Negative for back pain.  Skin: Negative.  Negative for itching and rash.  Neurological:  Positive for tingling. Negative for focal weakness and weakness.  Endo/Heme/Allergies:  Does not bruise/bleed easily.  Psychiatric/Behavioral:  Positive for memory loss. Negative for depression. The patient is not nervous/anxious and does not have insomnia.    PAST MEDICAL HISTORY :  Past Medical History:  Diagnosis Date   Abnormal prostate specific antigen 08/08/2012   Adiposity 04/16/2015   Chronic kidney disease (CKD), stage III (moderate) ([place]) 11/27/2016   CVA (cerebral vascular accident) ([place]) 06/17/2016   Diabetes mellitus without complication ([place])    Diverticulosis of sigmoid colon 04/16/2015   Dyslipidemia 03/18/2015   Hemorrhoids, internal 04/16/2015   Hypercholesteremia 04/16/2015   Hyperlipidemia    Hypertension    Primary cancer of left upper lobe of lung ([place])    Pulmonary embolism ([place])    Wears dentures    partial upper    PAST SURGICAL HISTORY :   Past Surgical History:  Procedure Laterality Date   COLONOSCOPY     COLONOSCOPY WITH PROPOFOL N/A 05/06/2015   Procedure: COLONOSCOPY WITH PROPOFOL;  Surgeon: Lucilla Lame, MD;  Location: [facility];  Service: Endoscopy;  Laterality: N/A;  ASCENDING COLON POLYPS X 2 TERMINAL ILEUM BIOPSY RANDOM COLON BX. TRANSVERSE COLON POLYP SIGMOID COLON POLYP   ESOPHAGOGASTRODUODENOSCOPY (EGD) WITH PROPOFOL N/A 05/06/2015   Procedure: ESOPHAGOGASTRODUODENOSCOPY (EGD) WITH PROPOFOL;  Surgeon: Lucilla Lame, MD;  [facility];  Service: Endoscopy;  Laterality: N/A;  GASTRIC BIOPSY X1   HOLEP-LASER ENUCLEATION OF THE PROSTATE WITH MORCELLATION N/A 01/10/2021   Procedure: HOLEP-LASER ENUCLEATION OF THE PROSTATE WITH MORCELLATION;  Surgeon: Billey Co, MD;  Location: [facility];   Service: Urology;  Laterality: N/A;   IVC FILTER INSERTION N/A 12/29/2020   Procedure: IVC FILTER INSERTION;  Surgeon: Dwana Curd, MD;  Location: [facility];  Service: Cardiovascular;  Laterality: N/A;   KIDNEY STONE SURGERY  2017    FAMILY HISTORY :   Family History  Problem Relation Age of Onset   Diabetes Mother    Diabetes Father    CAD Father    Dementia Father    Diabetes Sister    Cancer Maternal Uncle        Prostate   Cancer Cousin        prostate    SOCIAL HISTORY:   Social History   Tobacco Use   Smoking status: Never   Smokeless tobacco: Never   Tobacco comments:    Smoking cessation materials not required  Vaping Use   Vaping Use: Never used  Substance Use Topics   Alcohol use: No    Alcohol/week: 0.0 standard drinks   Drug use: No    ALLERGIES:  has No Known Allergies.  MEDICATIONS:  Current Outpatient Medications  Medication Sig Dispense Refill   amLODipine (NORVASC) 5 MG tablet Take 1 tablet (5 mg total) by mouth daily. 30 tablet 3   apixaban (ELIQUIS) 2.5 MG TABS tablet Take 1 tablet (2.5 mg total) by mouth 2 (two) times daily. 60 tablet 3   atorvastatin (LIPITOR) 40 MG tablet Take 1 tablet by mouth once daily 90 tablet 0   blood glucose meter kit and supplies Dispense based on patient and insurance preference (Accuchek Aviva Plus, Accuchek Nano). Use  up to four times daily as directed. (FOR ICD-10 E10.9, E11.9). 1 each 0   dexamethasone (DECADRON) 1 MG tablet Take 1 tablet (1 mg total) by mouth daily. 60 tablet 1   gabapentin (NEURONTIN) 300 MG capsule Take 1 capsule by mouth at bedtime 90 capsule 2   Insulin Pen Needle (NOVOFINE PEN NEEDLE) 32G X 6 MM MISC 1 each by Does not apply route daily. 100 each 2   insulin regular human CONCENTRATED (HUMULIN R U-500 KWIKPEN) 500 UNIT/ML KwikPen Inject 1-10 Units into the skin 3 (three) times daily with meals. Pre-meal insulin :  Check fsbs prior to meals  Hold if below 130 If glucose 130-  140 give 2 units If glucose 140 to 180 give 4 units  If glucose 180 to 250 give 6 units If glucose above 250 give 8 units 15 mL 0   SOLIQUA 100-33 UNT-MCG/ML SOPN INJECT 15 TO 30 UNITS  SUBCUTANEOUSLY ONCE DAILY 15 mL 0   levETIRAcetam (KEPPRA) 500 MG tablet Take 1 tablet by mouth twice daily (Patient not taking: Reported on 10/10/2021) 60 tablet 0   lorlatinib (LORBRENA) 25 MG tablet Take 2 tablets (50 mg total) by mouth daily. (Patient not taking: Reported on 09/26/2021) 60 tablet 1   No current facility-administered medications for this visit.    PHYSICAL EXAMINATION: ECOG PERFORMANCE STATUS: 0 - Asymptomatic  BP (!) 110/91 (BP Location: Left Arm, Patient Position: Sitting, Cuff Size: Normal)    Pulse 76    Temp (!) 97.2 F (36.2 C) (Tympanic)    Ht _0  (1.651 m)    Wt 152 lb 11.2 oz (69.3 kg)    SpO2 98%    BMI 25.41 kg/m   Filed Weights   10/10/21 0946  Weight: 152 lb 11.2 oz (69.3 kg)    Physical Exam HENT:     Head: Normocephalic and atraumatic.     Mouth/Throat:     Pharynx: No oropharyngeal exudate.  Eyes:     Pupils: Pupils are equal, round, and reactive to light.  Cardiovascular:     Rate and Rhythm: Normal rate and regular rhythm.  Pulmonary:     Effort: No respiratory distress.     Breath sounds: No wheezing.     Comments: Decreased breath sounds bilaterally no wheeze or crackles. Abdominal:     General: Bowel sounds are normal. There is no distension.     Palpations: Abdomen is soft. There is no mass.     Tenderness: There is no abdominal tenderness. There is no guarding or rebound.  Musculoskeletal:        General: No tenderness. Normal range of motion.     Cervical back: Normal range of motion and neck supple.  Skin:    General: Skin is warm.  Neurological:     Mental Status: He is alert and oriented to person, place, and time.  Psychiatric:        Mood and Affect: Affect normal.   LABORATORY DATA:  I have reviewed the data as listed    Component  Value Date/Time   NA 130 (L) 10/10/2021 0859   NA 139 01/01/2016 1000   K 3.8 10/10/2021 0859   CL 98 10/10/2021 0859   CO2 24 10/10/2021 0859   GLUCOSE 328 (H) 10/10/2021 0859   BUN 22 10/10/2021 0859   BUN 13 01/01/2016 1000   CREATININE 1.16 10/10/2021 0859   CREATININE 1.20 01/09/2021 1226   CALCIUM 9.5 10/10/2021 0859   PROT 7.5 10/10/2021 0859  PROT 7.2 01/01/2016 1000   ALBUMIN 3.9 10/10/2021 0859   ALBUMIN 4.4 01/01/2016 1000   AST 18 10/10/2021 0859   ALT 44 10/10/2021 0859   ALKPHOS 82 10/10/2021 0859   BILITOT 0.5 10/10/2021 0859   BILITOT 0.4 01/01/2016 1000   GFRNONAA >60 10/10/2021 0859   GFRNONAA 62 01/09/2021 1226   GFRAA 72 01/09/2021 1226    No results found for: SPEP, UPEP  Lab Results  Component Value Date   WBC 7.6 10/10/2021   NEUTROABS 4.9 10/10/2021   HGB 17.3 (H) 10/10/2021   HCT 51.9 10/10/2021   MCV 82.8 10/10/2021   PLT 202 10/10/2021      Chemistry      Component Value Date/Time   NA 130 (L) 10/10/2021 0859   NA 139 01/01/2016 1000   K 3.8 10/10/2021 0859   CL 98 10/10/2021 0859   CO2 24 10/10/2021 0859   BUN 22 10/10/2021 0859   BUN 13 01/01/2016 1000   CREATININE 1.16 10/10/2021 0859   CREATININE 1.20 01/09/2021 1226      Component Value Date/Time   CALCIUM 9.5 10/10/2021 0859   ALKPHOS 82 10/10/2021 0859   AST 18 10/10/2021 0859   ALT 44 10/10/2021 0859   BILITOT 0.5 10/10/2021 0859   BILITOT 0.4 01/01/2016 1000       RADIOGRAPHIC STUDIES: I have personally reviewed the radiological images as listed and agreed with the findings in the report. No results found.   ASSESSMENT & PLAN:  Primary cancer of left upper lobe of lung ([place]) #Brain metastases - MAY 2022-s/p WBRT- MRI brain DEC 2022-progressive disease Vs. Radiation necrosis- currently on Avastin #2 q 3W.  Plan #3 on on FEB 3rd.  Awaiting MRI on FEB 6th [after 3 cycles].  Discussed with Dr. Mickeal Skinner.  Discontinued Christophe Louis because of confusion and bedwetting;  currently resolved.   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 Will await MRI next week; will order CT in few months.   # Currently on Avatsin; proceed with #3; Labs today reviewed;  acceptable for treatment today.  Await MRI next week.  # Elevated BG- PBF- 328- on dex 85m once a day; #Start taking dexamethasone half a pill [0.5 mg once a day]; do not stop until further directed.awaiting repeat appt with PCP next week.     # Elevated BP- ? Avastin;[140/09s] recommend continue checking at home; continue  amlodipine 5 mg day.   # Bilateral PE & left lower extremity DVT:s/p IVC filter; currently on eliquis 2.5 g BID [5/13]-STABLE  # CKD stage III-creatinine 1.5 [GFR~51]- STABLE;  Nodularity within lumen of the bladder base presumably related to residual prostate hypertrophy following near total prostatectomy.  # Peripheral Neuropathy- STABLE;  DM;continue  gabapentin 200 mg qhs-stable  # DISPOSITION:   # Avastin today # follow up in 3 weeks--MD; labs cbc/cmp;urine protein creatinine ratio ;Avastin; -Dr.B  Cc; Dr.Sowles.    Orders Placed This Encounter  Procedures   Protein / creatinine ratio, urine    Standing Status:   Standing    Number of Occurrences:   18    Standing Expiration Date:   10/10/2022    All questions were answered. The patient knows to call the clinic with any problems, questions or concerns.      GCammie Sickle MD 10/10/2021 5:46 PM

## 2021-10-10 NOTE — Patient Instructions (Signed)
#  Start taking dexamethasone half a pill [0.5 mg once a day]; do not stop until further directed.

## 2021-10-10 NOTE — Assessment & Plan Note (Addendum)
#  Brain metastases - MAY 2022-s/p WBRT- MRI brain DEC 2022-progressive disease Vs. Radiation necrosis- currently on Avastin #2 q 3W.  Plan #3 on on FEB 3rd.  Awaiting MRI on FEB 6th [after 3 cycles].  Discussed with Dr. Mickeal Skinner.  Discontinued Melvin Willis because of confusion and bedwetting; currently resolved.   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 Will await MRI next week; will order CT in few months.   # Currently on Avatsin; proceed with #3; Labs today reviewed;  acceptable for treatment today.  Await MRI next week.  # Elevated BG- PBF- 328- on dex $Remo'1mg'CLdbB$  once a day; #Start taking dexamethasone half a pill [0.5 mg once a day]; do not stop until further directed.awaiting repeat appt with PCP next week.   # Elevated BP- ? Avastin;[140/09s] recommend continue checking at home; continue  amlodipine 5 mg day.   # Bilateral PE & left lower extremity DVT:s/p IVC filter; currently on eliquis 2.5 g BID [5/13]-STABLE  # CKD stage III-creatinine 1.5 [GFR~51]- STABLE;   # Peripheral Neuropathy- STABLE;  DM;continue  gabapentin 200 mg qhs-stable  # DISPOSITION:   # Avastin today # follow up in 3 weeks--MD; labs cbc/cmp;urine protein creatinine ratio ;Avastin; -Dr.B  Cc; Dr.Sowles.

## 2021-10-10 NOTE — Progress Notes (Signed)
Name: Melvin Willis   MRN: [id number]    DOB: 1950/11/13   Date:10/13/2021       Progress Note  Subjective  Chief Complaint  Follow Up  HPI  DMII: he is on Soliqua  26 units daily and his fasting glucose was controlled, he has been on dexamethasone tablets 2 mg daily and recently went down to 1 mg daily and soon will be down to half a pill. . His glucose today was 160 . We will increase Soliqua to 30 units daily today .We gave him Jumulin R to use pre-meal however wife has not been giving it to him because it does not work for lower doses, therefore we will change to sliding scale to either 5 units or 10 units ( 5 units if glucose before meals above 140 to 200 and 10 units if over 200 pre meal glucose)  He is on statin therapy as prescribed and denies side effects, LDL was  62 , last urine micro 27. A1C went from 7 to 9.6 % , discussed importance of following a healthier diet, discussed pasta substitutes   Chronic DVT left leg, history of PE : off Xarelto ( stopped March 22 due to hemorrhagic brain mets) but is now  on Eliquis because after he stopped Xarelto had recurrence of DVT, he also had IVC filter May 24 th, 2022. He denies easy bruising. He was diagnosed with DVT and PE prior to cancer diagnosis back in 2017. He has leg pains, chronic and stable, he has sob with activity but also  stable. 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 Rogue Bussing, he has finished radiation by Dr. Donella Stade. He has baseline sob with activity .we will send shingles and Tdap vaccine    History of CVA: initially had dizziness , he now has weakness on left side and finished PT, getting better daily, still taking Eliquis low dose and statin therapy. Stable   Atherosclerosis of aorta: he states taking Atorvastatin daily now , denies side effects Last LDL was at goal    CKI stage III: GFR has been between 47-60, going back and forth, he denies  pruritis, good urine output ,last urine micro slightly above goal   BPH and urinary retention : admitted 12/2020 with UTI, he is under the care of Dr. 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   Hyperlipidemia associated with type 2 diabetes mellitus ([place]) 02/05/2021   Deep vein thrombosis (DVT) of proximal lower extremity ([place])    Urinary retention 12/29/2020   Leg DVT (deep venous thromboembolism), acute, bilateral ([place]) 12/29/2020   Delirium    Palliative care encounter    Pneumonia due to infectious organism    Supratherapeutic INR 11/14/2020   Acute-on-chronic kidney injury ([place]) 11/14/2020   Brain metastasis ([place]) 09/25/2019   Atherosclerosis of aorta ([place]) 09/04/2019   Primary malignant neoplasm of lung metastatic to other site [facility]) 04/07/2019   Chronic deep vein thrombosis (DVT) of left lower extremity ([place]) 01/05/2018   Chronic kidney disease (CKD), stage III (moderate) ([place]) 11/27/2016   Blurred vision, bilateral 06/23/2016   Primary cancer of left upper lobe of lung ([place]) 06/12/2016   Mediastinal mass    History of nephrolithiasis 03/27/2016   Pharyngeal dysphagia 01/01/2016   Benign  neoplasm of ascending colon    Benign neoplasm of sigmoid colon    Benign neoplasm of transverse colon    Diverticulosis of large intestine without diverticulitis    Overweight (BMI 25.0-29.9) 04/16/2015   Type 2 diabetes mellitus with stage 3 chronic kidney disease ([place]) 03/18/2015   Dyslipidemia 03/18/2015   Disorder of male genital organ 08/08/2012   Nodular prostate with urinary obstruction 08/08/2012   Elevated PSA 08/08/2012    Past Surgical History:  Procedure Laterality Date   COLONOSCOPY     COLONOSCOPY WITH PROPOFOL N/A 05/06/2015   Procedure: COLONOSCOPY WITH PROPOFOL;  Surgeon: Lucilla Lame, MD;  Location: [place];   Service: Endoscopy;  Laterality: N/A;  ASCENDING COLON POLYPS X 2 TERMINAL ILEUM BIOPSY RANDOM COLON BX. TRANSVERSE COLON POLYP SIGMOID COLON POLYP   ESOPHAGOGASTRODUODENOSCOPY (EGD) WITH PROPOFOL N/A 05/06/2015   Procedure: ESOPHAGOGASTRODUODENOSCOPY (EGD) WITH PROPOFOL;  Surgeon: Lucilla Lame, MD;  Location: [place];  Service: Endoscopy;  Laterality: N/A;  GASTRIC BIOPSY X1   HOLEP-LASER ENUCLEATION OF THE PROSTATE WITH MORCELLATION N/A 01/10/2021   Procedure: HOLEP-LASER ENUCLEATION OF THE PROSTATE WITH MORCELLATION;  Surgeon: Billey Co, MD;  Location: [facility];  Service: Urology;  Laterality: N/A;   IVC FILTER INSERTION N/A 12/29/2020   Procedure: IVC FILTER INSERTION;  Surgeon: Dwana Curd, MD;  Location: [facility];  Service: Cardiovascular;  Laterality: N/A;   KIDNEY STONE SURGERY  2017    Family History  Problem Relation Age of Onset   Diabetes Mother    Diabetes Father    CAD Father    Dementia Father    Diabetes Sister    Cancer Maternal Uncle        Prostate   Cancer Cousin        prostate    Social History   Tobacco Use   Smoking status: Never   Smokeless tobacco: Never   Tobacco comments:    Smoking cessation materials not required  Substance Use Topics   Alcohol use: No    Alcohol/week: 0.0 standard drinks     Current Outpatient Medications:    amLODipine (NORVASC) 5 MG tablet, Take 1 tablet (5 mg total) by mouth daily., Disp: 30 tablet, Rfl: 3   apixaban (ELIQUIS) 2.5 MG TABS tablet, Take 1 tablet (2.5 mg total) by mouth 2 (two) times daily., Disp: 60 tablet, Rfl: 3   atorvastatin (LIPITOR) 40 MG tablet, Take 1 tablet by mouth once daily, Disp: 90 tablet, Rfl: 0   blood glucose meter kit and supplies, Dispense based on patient and insurance preference (Accuchek Aviva Plus, Accuchek Nano). Use up to four times daily as directed. (FOR ICD-10 E10.9, E11.9)., Disp: 1 each, Rfl: 0   dexamethasone (DECADRON) 1 MG tablet, Take 1  tablet (1 mg total) by mouth daily., Disp: 60 tablet, Rfl: 1   gabapentin (NEURONTIN) 300 MG capsule, Take 1 capsule by mouth at bedtime, Disp: 90 capsule, Rfl: 2   Insulin Pen Needle (NOVOFINE PEN NEEDLE) 32G X 6 MM MISC, 1 each by Does not apply route daily., Disp: 100 each, Rfl: 2   insulin regular human CONCENTRATED (HUMULIN R U-500 KWIKPEN) 500 UNIT/ML KwikPen, Inject 1-10 Units into the skin 3 (three) times daily with meals. Pre-meal insulin :  Check fsbs prior to meals  Hold if below 130 If glucose 130- 140 give 2 units If glucose 140 to 180 give 4 units  If glucose 180 to 250 give 6 units If glucose above 250  give 8 units, Disp: 15 mL, Rfl: 0   levETIRAcetam (KEPPRA) 500 MG tablet, Take 1 tablet by mouth twice daily, Disp: 60 tablet, Rfl: 0   lorlatinib (LORBRENA) 25 MG tablet, Take 2 tablets (50 mg total) by mouth daily., Disp: 60 tablet, Rfl: 1   SOLIQUA 100-33 UNT-MCG/ML SOPN, INJECT 15 TO 30 UNITS  SUBCUTANEOUSLY ONCE DAILY, Disp: 15 mL, Rfl: 0  No Known Allergies  I personally reviewed active problem list, medication list, allergies, family history, social history, health maintenance with the patient/caregiver today.   ROS  Constitutional: Negative for fever or weight change.  Respiratory: Negative for cough , positive for shortness of breath.   Cardiovascular: Negative for chest pain or palpitations.  Gastrointestinal: Negative for abdominal pain, no bowel changes.  Musculoskeletal: Positive for gait problem or joint swelling.  Skin: Negative for rash.  Neurological: Negative for dizziness or headache.  No other specific complaints in a complete review of systems (except as listed in HPI above).   Objective  Vitals:   10/13/21 0931  BP: 136/84  Pulse: 92  Resp: 16  SpO2: 98%  Weight: 152 lb (68.9 kg)  Height: '5\' 5"'  (1.651 m)    Body mass index is 25.29 kg/m.  Physical Exam  Constitutional: Patient appears well-developed and well-nourished.  No distress.  HEENT:  head atraumatic, normocephalic, pupils equal and reactive to light,  neck supple Cardiovascular: Normal rate, regular rhythm and normal heart sounds.  No murmur heard. No BLE edema. Pulmonary/Chest: Effort normal and breath sounds normal. No respiratory distress. Abdominal: Soft.  There is no tenderness. Psychiatric: very quiet , cooperative   Recent Results (from the past 2160 hour(s))  Comprehensive metabolic panel     Status: Abnormal   Collection Time: 07/25/21 10:05 AM  Result Value Ref Range   Sodium 136 135 - 145 mmol/L   Potassium 4.1 3.5 - 5.1 mmol/L   Chloride 104 98 - 111 mmol/L   CO2 25 22 - 32 mmol/L   Glucose, Bld 140 (H) 70 - 99 mg/dL    Comment: Glucose reference range applies only to samples taken after fasting for at least 8 hours.   BUN 12 8 - 23 mg/dL   Creatinine, Ser 1.19 0.61 - 1.24 mg/dL   Calcium 8.8 (L) 8.9 - 10.3 mg/dL   Total Protein 7.4 6.5 - 8.1 g/dL   Albumin 3.8 3.5 - 5.0 g/dL   AST 29 15 - 41 U/L   ALT 56 (H) 0 - 44 U/L   Alkaline Phosphatase 100 38 - 126 U/L   Total Bilirubin 0.5 0.3 - 1.2 mg/dL   GFR, Estimated >60 >60 mL/min    Comment: (NOTE) Calculated using the CKD-EPI Creatinine Equation (2021)    Anion gap 7 5 - 15    Comment: Performed at [facility], [address] [place], [place] [address]  CBC with Differential     Status: Abnormal   Collection Time: 07/25/21 10:05 AM  Result Value Ref Range   WBC 4.0 4.0 - 10.5 K/uL   RBC 5.86 (H) 4.22 - 5.81 MIL/uL   Hemoglobin 15.0 13.0 - 17.0 g/dL   HCT 47.3 39.0 - 52.0 %   MCV 80.7 80.0 - 100.0 fL   MCH 25.6 (L) 26.0 - 34.0 pg   MCHC 31.7 30.0 - 36.0 g/dL   RDW 17.2 (H) 11.5 - 15.5 %   Platelets 233 150 - 400 K/uL   nRBC 0.0 0.0 - 0.2 %   Neutrophils  Relative % 45 %   Neutro Abs 1.8 1.7 - 7.7 K/uL   Lymphocytes Relative 40 %   Lymphs Abs 1.6 0.7 - 4.0 K/uL   Monocytes Relative 12 %   Monocytes Absolute 0.5 0.1 - 1.0 K/uL   Eosinophils Relative 2 %   Eosinophils Absolute 0.1  0.0 - 0.5 K/uL   Basophils Relative 1 %   Basophils Absolute 0.0 0.0 - 0.1 K/uL   Immature Granulocytes 0 %   Abs Immature Granulocytes 0.00 0.00 - 0.07 K/uL    Comment: Performed at [facility], [address] [place], [place] [address]  Comprehensive metabolic panel     Status: Abnormal   Collection Time: 08/22/21  9:25 AM  Result Value Ref Range   Sodium 131 (L) 135 - 145 mmol/L   Potassium 4.1 3.5 - 5.1 mmol/L   Chloride 96 (L) 98 - 111 mmol/L   CO2 25 22 - 32 mmol/L   Glucose, Bld 353 (H) 70 - 99 mg/dL    Comment: Glucose reference range applies only to samples taken after fasting for at least 8 hours.   BUN 23 8 - 23 mg/dL   Creatinine, Ser 1.35 (H) 0.61 - 1.24 mg/dL   Calcium 9.4 8.9 - 10.3 mg/dL   Total Protein 7.6 6.5 - 8.1 g/dL   Albumin 4.0 3.5 - 5.0 g/dL   AST 20 15 - 41 U/L   ALT 57 (H) 0 - 44 U/L   Alkaline Phosphatase 108 38 - 126 U/L   Total Bilirubin 0.9 0.3 - 1.2 mg/dL   GFR, Estimated 57 (L) >60 mL/min    Comment: (NOTE) Calculated using the CKD-EPI Creatinine Equation (2021)    Anion gap 10 5 - 15    Comment: Performed at [facility], [address] [place], [place] [address]  CBC with Differential/Platelet     Status: Abnormal   Collection Time: 08/22/21  9:25 AM  Result Value Ref Range   WBC 10.8 (H) 4.0 - 10.5 K/uL   RBC 6.30 (H) 4.22 - 5.81 MIL/uL   Hemoglobin 16.8 13.0 - 17.0 g/dL   HCT 50.4 39.0 - 52.0 %   MCV 80.0 80.0 - 100.0 fL   MCH 26.7 26.0 - 34.0 pg   MCHC 33.3 30.0 - 36.0 g/dL   RDW 15.7 (H) 11.5 - 15.5 %   Platelets 248 150 - 400 K/uL   nRBC 0.0 0.0 - 0.2 %   Neutrophils Relative % 80 %   Neutro Abs 8.7 (H) 1.7 - 7.7 K/uL   Lymphocytes Relative 14 %   Lymphs Abs 1.5 0.7 - 4.0 K/uL   Monocytes Relative 6 %   Monocytes Absolute 0.6 0.1 - 1.0 K/uL   Eosinophils Relative 0 %   Eosinophils Absolute 0.0 0.0 - 0.5 K/uL   Basophils Relative 0 %   Basophils Absolute 0.0 0.0 - 0.1 K/uL   Immature Granulocytes 0 %   Abs  Immature Granulocytes 0.03 0.00 - 0.07 K/uL    Comment: Performed at [facility], [address] [place], [place] [address]  Protein, urine, random     Status: None   Collection Time: 08/22/21 11:15 AM  Result Value Ref Range   Total Protein, Urine 9 mg/dL    Comment: NO NORMAL RANGE ESTABLISHED FOR THIS TEST Performed at [facility], [address] [place], [place] [address]   POCT Glucose (CBG)     Status: Abnormal   Collection Time: 08/25/21  4:22 PM  Result Value Ref Range   POC Glucose 122 (A) 70 - 99 mg/dl  Bladder Scan (Post Void Residual) in office     Status: None   Collection Time: 09/18/21  9:58 AM  Result Value Ref Range   Scan Result 90m   CBC with Differential     Status: Abnormal   Collection Time: 09/19/21  9:12 AM  Result Value Ref Range   WBC 6.5 4.0 - 10.5 K/uL   RBC 5.84 (H) 4.22 - 5.81 MIL/uL   Hemoglobin 16.1 13.0 - 17.0 g/dL   HCT 49.0 39.0 - 52.0 %   MCV 83.9 80.0 - 100.0 fL   MCH 27.6 26.0 - 34.0 pg   MCHC 32.9 30.0 - 36.0 g/dL   RDW 18.0 (H) 11.5 - 15.5 %   Platelets 102 (L) 150 - 400 K/uL    Comment: SPECIMEN CHECKED FOR CLOTS   nRBC 0.0 0.0 - 0.2 %   Neutrophils Relative % 55 %   Neutro Abs 3.6 1.7 - 7.7 K/uL   Lymphocytes Relative 29 %   Lymphs Abs 1.9 0.7 - 4.0 K/uL   Monocytes Relative 13 %   Monocytes Absolute 0.8 0.1 - 1.0 K/uL   Eosinophils Relative 1 %   Eosinophils Absolute 0.1 0.0 - 0.5 K/uL   Basophils Relative 1 %   Basophils Absolute 0.0 0.0 - 0.1 K/uL   Immature Granulocytes 1 %   Abs Immature Granulocytes 0.09 (H) 0.00 - 0.07 K/uL    Comment: Performed at [facility] [address] [place] [address] Comprehensive metabolic panel     Status: Abnormal   Collection Time: 09/19/21  9:12 AM  Result Value Ref Range   Sodium 129 (L) 135 - 145 mmol/L   Potassium 4.0 3.5 - 5.1 mmol/L   Chloride 99 98 - 111 mmol/L   CO2 26 22 - 32 mmol/L   Glucose, Bld 254 (H) 70 - 99 mg/dL    Comment:  Glucose reference range applies only to samples taken after fasting for at least 8 hours.   BUN 22 8 - 23 mg/dL   Creatinine, Ser 1.01 0.61 - 1.24 mg/dL   Calcium 8.7 (L) 8.9 - 10.3 mg/dL   Total Protein 6.4 (L) 6.5 - 8.1 g/dL   Albumin 3.3 (L) 3.5 - 5.0 g/dL   AST 18 15 - 41 U/L   ALT 38 0 - 44 U/L   Alkaline Phosphatase 62 38 - 126 U/L   Total Bilirubin 0.5 0.3 - 1.2 mg/dL   GFR, Estimated >60 >60 mL/min    Comment: (NOTE) Calculated using the CKD-EPI Creatinine Equation (2021)    Anion gap 4 (L) 5 - 15    Comment: [facility]formed at [facility], [place] [id number] Protein Electro, Random Urine     Status: Abnormal   Collection Time: 09/19/21  9:12 AM  Result Value Ref Range   Total Protein, Urine 17.9 Not Estab. mg/dL   Albumin ELP, Urine 25.0 %   Alpha-1-Globulin, U 4.9 %   Alpha-2-Globulin, U 14.1 %   Beta Globulin, U 27.9 %   Gamma Globulin, U 28.2 %   M Component, Ur 10.7 (H) Not Observed %   Please Note: Comment     Comment: (NOTE) Protein electrophoresis scan will follow via computer, mail, or courier delivery. Performed At: [id number] Farmer[place] PQH:[id number]  Multiple Myeloma  Panel (SPEP&IFE w/QIG)     Status: Abnormal   Collection Time: 09/26/21  9:40 AM  Result Value Ref Range   IgG (Immunoglobin G), Serum 887 603 - 1,613 mg/dL   IgA 530 (H) 61 - 437 mg/dL   IgM (Immunoglobulin M), Srm 51 20 - 172 mg/dL   Total Protein ELP 6.6 6.0 - 8.5 g/dL   Albumin SerPl Elph-Mcnc 3.1 2.9 - 4.4 g/dL   Alpha 1 0.3 0.0 - 0.4 g/dL   Alpha2 Glob SerPl Elph-Mcnc 0.8 0.4 - 1.0 g/dL   B-Globulin SerPl Elph-Mcnc 1.5 (H) 0.7 - 1.3 g/dL   Gamma Glob SerPl Elph-Mcnc 0.9 0.4 - 1.8 g/dL   M Protein SerPl Elph-Mcnc Comment (A) Not Observed g/dL    Comment: (NOTE) Immunofixation detected an M-spike in the beta fraction of the Protein Electrophoresis; unable to quantify due to the  co-migration with other proteins.    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    Please Note Comment     Comment: (NOTE) Protein electrophoresis scan will follow via computer, mail, or courier delivery. Performed At: [facility] [place], [place] [id number] Rush Farmer MD [id number]   Kappa/lambda light chains     Status: Abnormal   Collection Time: 09/26/21  9:40 AM  Result Value Ref Range   Kappa free light chain 86.9 (H) 3.3 - 19.4 mg/L   Lambda free light chains 18.0 5.7 - 26.3 mg/L   Kappa, lambda light chain ratio 4.83 (H) 0.26 - 1.65    Comment: (NOTE) Performed At: [facility] [place], [place] [id number] Rush Farmer MD MA:[id number]   Comprehensive metabolic panel     Status: Abnormal   Collection Time: 09/26/21  9:40 AM  Result Value Ref Range   Sodium 136 135 - 145 mmol/L   Potassium 3.9 3.5 - 5.1 mmol/L   Chloride 102 98 - 111 mmol/L   CO2 25 22 - 32 mmol/L   Glucose, Bld 218 (H) 70 - 99 mg/dL    Comment: Glucose reference range applies only to samples taken after fasting for at least 8 hours.   BUN 16 8 - 23 mg/dL   Creatinine, Ser 1.16 0.61 - 1.24 mg/dL   Calcium 9.5 8.9 - 10.3 mg/dL   Total Protein 7.1 6.5 - 8.1 g/dL   Albumin 3.8 3.5 - 5.0 g/dL   AST 27 15 - 41 U/L   ALT 64 (H) 0 - 44 U/L   Alkaline Phosphatase 74 38 - 126 U/L   Total Bilirubin 0.6 0.3 - 1.2 mg/dL   GFR, Estimated >60 >60 mL/min    Comment: (NOTE) Calculated using the CKD-EPI Creatinine Equation (2021)    Anion gap 9 5 - 15    Comment: Performed at [facility], [address] [place], [place] [address]  CBC with Differential/Platelet     Status: Abnormal   Collection Time: 09/26/21  9:40 AM  Result Value Ref Range   WBC 4.7 4.0 - 10.5 K/uL   RBC 5.96 (H) 4.22 - 5.81  MIL/uL   Hemoglobin 16.7 13.0 - 17.0 g/dL   HCT 49.9 39.0 - 52.0 %   MCV 83.7 80.0 - 100.0 fL   MCH 28.0 26.0 - 34.0 pg   MCHC 33.5 30.0 - 36.0 g/dL  RDW 17.2 (H) 11.5 - 15.5 %   Platelets 127 (L) 150 - 400 K/uL   nRBC 0.0 0.0 - 0.2 %   Neutrophils Relative % 59 %   Neutro Abs 2.7 1.7 - 7.7 K/uL   Lymphocytes Relative 28 %   Lymphs Abs 1.3 0.7 - 4.0 K/uL   Monocytes Relative 11 %   Monocytes Absolute 0.5 0.1 - 1.0 K/uL   Eosinophils Relative 1 %   Eosinophils Absolute 0.0 0.0 - 0.5 K/uL   Basophils Relative 0 %   Basophils Absolute 0.0 0.0 - 0.1 K/uL   Immature Granulocytes 1 %   Abs Immature Granulocytes 0.04 0.00 - 0.07 K/uL    Comment: Performed at [facility], [address] [place], [place] [address]  CBC with Differential     Status: Abnormal   Collection Time: 10/10/21  8:59 AM  Result Value Ref Range   WBC 7.6 4.0 - 10.5 K/uL   RBC 6.27 (H) 4.22 - 5.81 MIL/uL   Hemoglobin 17.3 (H) 13.0 - 17.0 g/dL   HCT 51.9 39.0 - 52.0 %   MCV 82.8 80.0 - 100.0 fL   MCH 27.6 26.0 - 34.0 pg   MCHC 33.3 30.0 - 36.0 g/dL   RDW 15.8 (H) 11.5 - 15.5 %   Platelets 202 150 - 400 K/uL   nRBC 0.0 0.0 - 0.2 %   Neutrophils Relative % 66 %   Neutro Abs 4.9 1.7 - 7.7 K/uL   Lymphocytes Relative 23 %   Lymphs Abs 1.8 0.7 - 4.0 K/uL   Monocytes Relative 10 %   Monocytes Absolute 0.8 0.1 - 1.0 K/uL   Eosinophils Relative 0 %   Eosinophils Absolute 0.0 0.0 - 0.5 K/uL   Basophils Relative 0 %   Basophils Absolute 0.0 0.0 - 0.1 K/uL   Immature Granulocytes 1 %   Abs Immature Granulocytes 0.07 0.00 - 0.07 K/uL    Comment: Performed at [facility], [address] [place], [place] [address]  Comprehensive metabolic panel     Status: Abnormal   Collection Time: 10/10/21  8:59 AM  Result Value Ref Range   Sodium 130 (L) 135 - 145 mmol/L   Potassium 3.8 3.5 - 5.1 mmol/L   Chloride 98 98 - 111 mmol/L   CO2 24 22 - 32 mmol/L   Glucose, Bld 328 (H) 70 - 99 mg/dL    Comment:  Glucose reference range applies only to samples taken after fasting for at least 8 hours.   BUN 22 8 - 23 mg/dL   Creatinine, Ser 1.16 0.61 - 1.24 mg/dL   Calcium 9.5 8.9 - 10.3 mg/dL   Total Protein 7.5 6.5 - 8.1 g/dL   Albumin 3.9 3.5 - 5.0 g/dL   AST 18 15 - 41 U/L   ALT 44 0 - 44 U/L   Alkaline Phosphatase 82 38 - 126 U/L   Total Bilirubin 0.5 0.3 - 1.2 mg/dL   GFR, Estimated >60 >60 mL/min    Comment: (NOTE) Calculated using the CKD-EPI Creatinine Equation (2021)    Anion gap 8 5 - 15    Comment: Performed at [facility], [address] [place], [place] [address]  Protein / creatinine ratio, urine     Status: Abnormal   Collection Time: 10/10/21  9:00 AM  Result Value Ref Range   Creatinine, Urine 116 mg/dL   Total Protein, Urine 31 mg/dL    Comment: NO NORMAL RANGE ESTABLISHED FOR THIS  TEST   Protein Creatinine Ratio 0.27 (H) 0.00 - 0.15 mg/mg[Cre]    Comment: Performed at [facility], [address] [place], [place] [address]  POCT HgB A1C     Status: Abnormal   Collection Time: 10/13/21  9:43 AM  Result Value Ref Range   Hemoglobin A1C 9.6 (A) 4.0 - 5.6 %   HbA1c POC (<> result, manual entry)     HbA1c, POC (prediabetic range)     HbA1c, POC (controlled diabetic range)      Diabetic Foot Exam: Diabetic Foot Exam - Simple   Simple Foot Form Diabetic Foot exam was performed with the following findings: Yes 10/13/2021 10:18 AM  Visual Inspection See comments: Yes Sensation Testing Intact to touch and monofilament testing bilaterally: Yes Pulse Check Posterior Tibialis and Dorsalis pulse intact bilaterally: Yes Comments Thick toe nail      PHQ2/9: Depression screen [facility] 2/9 10/13/2021 08/25/2021 06/11/2021 04/24/2021 02/05/2021  Decreased Interest 0 0 0 0 0  Down, Depressed, Hopeless 0 0 0 0 0  PHQ - 2 Score 0 0 0 0 0  Altered sleeping 0 0 - - -  Tired, decreased energy 0 0 - - -  Change in appetite 0 0 - - -  Feeling bad or failure about  yourself  0 0 - - -  Trouble concentrating 0 0 - - -  Moving slowly or fidgety/restless 0 0 - - -  Suicidal thoughts 0 0 - - -  PHQ-9 Score 0 0 - - -  Difficult doing work/chores - - - - -  Some recent data might be hidden    phq 9 is negative   Fall Risk: Fall Risk  10/13/2021 08/25/2021 06/11/2021 04/24/2021 02/05/2021  Falls in the past year? 0 0 0 1 1  Number falls in past yr: 0 0 0 1 1  Injury with Fall? 0 0 0 0 0  Risk for fall due to : No Fall Risks No Fall Risks History of fall(s) History of fall(s) History of fall(s);Impaired balance/gait;Impaired mobility  Follow up Falls prevention discussed Falls prevention discussed Falls prevention discussed Falls prevention discussed Falls evaluation completed      Functional Status Survey: Is the patient deaf or have difficulty hearing?: No Does the patient have difficulty seeing, even when wearing glasses/contacts?: No Does the patient have difficulty concentrating, remembering, or making decisions?: Yes Does the patient have difficulty walking or climbing stairs?: Yes Does the patient have difficulty dressing or bathing?: No Does the patient have difficulty doing errands alone such as visiting a doctor's office or shopping?: Yes    Assessment & Plan  1. Type 2 diabetes mellitus with hyperglycemia, with long-term current use of insulin (HCC)  - POCT HgB A1C - HM Diabetes Foot Exam  2. Hyperlipidemia associated with type 2 diabetes mellitus ([place])  Reviewed medications with wife and how to adjust sliding scale, also the need to cut down on intake of carbs  3. Hemiparesis affecting left side as late effect of cerebrovascular accident (CVA) ([place])  Still weaker on left side  4. 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 Brain metastasis ([place])   7. Atherosclerosis of aorta (HCC)  Continue statin therapy   8. Need for shingles vaccine  - Zoster Vaccine  Adjuvanted [facility]) injection; Inject 0.5 mLs into the muscle once for 1 dose.  Dispense: 0.5 mL; Refill: 1  9. Need  for Tdap vaccination  - Tdap (ADACEL) 01-06-14.5 LF-MCG/0.5 injection; Inject 0.5 mLs into the muscle once for 1 dose.  Dispense: 0.5 mL; Refill: 0  10. Primary malignant neoplasm of lung metastatic to other site, unspecified laterality ([place])

## 2021-10-10 NOTE — Patient Instructions (Signed)
[facility] CANCER CTR AT [place]  Discharge Instructions: Thank you for choosing [facility] to provide your oncology and hematology care.  If you have a lab appointment with the [facility], please go directly to the [facility] and check in at the registration area.  Wear comfortable clothing and clothing appropriate for easy access to any Portacath or PICC line.   We strive to give you quality time with your provider. You may need to reschedule your appointment if you arrive late (15 or more minutes).  Arriving late affects you and other patients whose appointments are after yours.  Also, if you miss three or more appointments without notifying the office, you may be dismissed from the clinic at the providers discretion.      For prescription refill requests, have your pharmacy contact our office and allow 72 hours for refills to be completed.    Today you received the following chemotherapy and/or immunotherapy agents: Avastin      To help prevent nausea and vomiting after your treatment, we encourage you to take your nausea medication as directed.  BELOW ARE SYMPTOMS THAT SHOULD BE REPORTED IMMEDIATELY: *FEVER GREATER THAN 100.4 F (38 C) OR HIGHER *CHILLS OR SWEATING *NAUSEA AND VOMITING THAT IS NOT CONTROLLED WITH YOUR NAUSEA MEDICATION *UNUSUAL SHORTNESS OF BREATH *UNUSUAL BRUISING OR BLEEDING *URINARY PROBLEMS (pain or burning when urinating, or frequent urination) *BOWEL PROBLEMS (unusual diarrhea, constipation, pain near the anus) TENDERNESS IN MOUTH AND THROAT WITH OR WITHOUT PRESENCE OF ULCERS (sore throat, sores in mouth, or a toothache) UNUSUAL RASH, SWELLING OR PAIN  UNUSUAL VAGINAL DISCHARGE OR ITCHING   Items with * indicate a potential emergency and should be followed up as soon as possible or go to the Emergency Department if any problems should occur.  Please show the CHEMOTHERAPY ALERT CARD or IMMUNOTHERAPY ALERT CARD at check-in to  the Emergency Department and triage nurse.  Should you have questions after your visit or need to cancel or reschedule your appointment, please contact [facility] CANCER [place] AT [place]  [phone] and follow the prompts.  Office hours are 8:00 a.m. to 4:30 p.m. Monday - Friday. Please note that voicemails left after 4:00 p.m. may not be returned until the following business day.  We are closed weekends and major holidays. You have access to a nurse at all times for urgent questions. Please call the main number to the clinic [phone] and follow the prompts.  For any non-urgent questions, you may also contact your provider using MyChart. We now offer e-Visits for anyone 23 and older to request care online for non-urgent symptoms. For details visit [email or web address].   Also download the MyChart app! Go to the app store, search "MyChart", open the app, select , and log in with your MyChart username and password.  Due to Covid, a mask is required upon entering the hospital/clinic. If you do not have a mask, one will be given to you upon arrival. For doctor visits, patients may have 1 support person aged 21 or older with them. For treatment visits, patients cannot have anyone with them due to current Covid guidelines and our immunocompromised population. Bevacizumab injection What is this medication? BEVACIZUMAB (be va SIZ yoo mab) is a monoclonal antibody. It is used to treat many types of cancer. This medicine may be used for other purposes; ask your health care provider or pharmacist if you have questions. COMMON BRAND NAME(S): Alymsys, Avastin, MVASI, Zirabev What should I tell my care  team before I take this medication? They need to know if you have any of these conditions: diabetes heart disease high blood pressure history of coughing up blood prior anthracycline chemotherapy (e.g., doxorubicin, daunorubicin, epirubicin) recent or ongoing radiation  therapy recent or planning to have surgery stroke an unusual or allergic reaction to bevacizumab, hamster proteins, mouse proteins, other medicines, foods, dyes, or preservatives pregnant or trying to get pregnant breast-feeding How should I use this medication? This medicine is for infusion into a vein. It is given by a health care professional in a hospital or clinic setting. Talk to your pediatrician regarding the use of this medicine in children. Special care may be needed. Overdosage: If you think you have taken too much of this medicine contact a poison control center or emergency room at once. NOTE: This medicine is only for you. Do not share this medicine with others. What if I miss a dose? It is important not to miss your dose. Call your doctor or health care professional if you are unable to keep an appointment. What may interact with this medication? Interactions are not expected. This list may not describe all possible interactions. Give your health care provider a list of all the medicines, herbs, non-prescription drugs, or dietary supplements you use. Also tell them if you smoke, drink alcohol, or use illegal drugs. Some items may interact with your medicine. What should I watch for while using this medication? Your condition will be monitored carefully while you are receiving this medicine. You will need important blood work and urine testing done while you are taking this medicine. This medicine may increase your risk to bruise or bleed. Call your doctor or health care professional if you notice any unusual bleeding. Before having surgery, talk to your health care provider to make sure it is ok. This drug can increase the risk of poor healing of your surgical site or wound. You will need to stop this drug for 28 days before surgery. After surgery, wait at least 28 days before restarting this drug. Make sure the surgical site or wound is healed enough before restarting this drug.  Talk to your health care provider if questions. Do not become pregnant while taking this medicine or for 6 months after stopping it. Women should inform their doctor if they wish to become pregnant or think they might be pregnant. There is a potential for serious side effects to an unborn child. Talk to your health care professional or pharmacist for more information. Do not breast-feed an infant while taking this medicine and for 6 months after the last dose. This medicine has caused ovarian failure in some women. This medicine may interfere with the ability to have a child. You should talk to your doctor or health care professional if you are concerned about your fertility. What side effects may I notice from receiving this medication? Side effects that you should report to your doctor or health care professional as soon as possible: allergic reactions like skin rash, itching or hives, swelling of the face, lips, or tongue chest pain or chest tightness chills coughing up blood high fever seizures severe constipation signs and symptoms of bleeding such as bloody or black, tarry stools; red or dark-brown urine; spitting up blood or brown material that looks like coffee grounds; red spots on the skin; unusual bruising or bleeding from the eye, gums, or nose signs and symptoms of a blood clot such as breathing problems; chest pain; severe, sudden headache; pain, swelling, warmth  in the leg signs and symptoms of a stroke like changes in vision; confusion; trouble speaking or understanding; severe headaches; sudden numbness or weakness of the face, arm or leg; trouble walking; dizziness; loss of balance or coordination stomach pain sweating swelling of legs or ankles vomiting weight gain Side effects that usually do not require medical attention (report to your doctor or health care professional if they continue or are bothersome): back pain changes in taste decreased appetite dry  skin nausea tiredness This list may not describe all possible side effects. Call your doctor for medical advice about side effects. You may report side effects to FDA at 1-800-FDA-1088. Where should I keep my medication? This drug is given in a hospital or clinic and will not be stored at home. NOTE: This sheet is a summary. It may not cover all possible information. If you have questions about this medicine, talk to your doctor, pharmacist, or health care provider.  2022 Elsevier/Gold Standard (2021-05-13 00:00:00)

## 2021-10-13 ENCOUNTER — Encounter: Payer: Self-pay | Admitting: Family Medicine

## 2021-10-13 ENCOUNTER — Ambulatory Visit (INDEPENDENT_AMBULATORY_CARE_PROVIDER_SITE_OTHER): Payer: Medicare Other | Admitting: Family Medicine

## 2021-10-13 ENCOUNTER — Ambulatory Visit
Admission: RE | Admit: 2021-10-13 | Discharge: 2021-10-13 | Disposition: A | Discharge status: Home / Self Care | Payer: Medicare Other | Source: Ambulatory Visit | Attending: Internal Medicine | Admitting: Internal Medicine

## 2021-10-13 ENCOUNTER — Other Ambulatory Visit: Payer: Self-pay

## 2021-10-13 ENCOUNTER — Other Ambulatory Visit: Payer: Self-pay | Admitting: Family Medicine

## 2021-10-13 VITALS — BP 136/84 | HR 92 | Resp 16 | Ht 65.0 in | Wt 152.0 lb

## 2021-10-13 DIAGNOSIS — I700 Atherosclerosis of aorta: Secondary | ICD-10-CM | POA: Diagnosis not present

## 2021-10-13 DIAGNOSIS — G319 Degenerative disease of nervous system, unspecified: Secondary | ICD-10-CM | POA: Diagnosis not present

## 2021-10-13 DIAGNOSIS — C7931 Secondary malignant neoplasm of brain: Secondary | ICD-10-CM | POA: Diagnosis not present

## 2021-10-13 DIAGNOSIS — Z23 Encounter for immunization: Secondary | ICD-10-CM

## 2021-10-13 DIAGNOSIS — N1831 Chronic kidney disease, stage 3a: Secondary | ICD-10-CM

## 2021-10-13 DIAGNOSIS — Z794 Long term (current) use of insulin: Secondary | ICD-10-CM

## 2021-10-13 DIAGNOSIS — C3490 Malignant neoplasm of unspecified part of unspecified bronchus or lung: Secondary | ICD-10-CM

## 2021-10-13 DIAGNOSIS — I69354 Hemiplegia and hemiparesis following cerebral infarction affecting left non-dominant side: Secondary | ICD-10-CM | POA: Diagnosis not present

## 2021-10-13 DIAGNOSIS — E1165 Type 2 diabetes mellitus with hyperglycemia: Secondary | ICD-10-CM | POA: Diagnosis not present

## 2021-10-13 DIAGNOSIS — E785 Hyperlipidemia, unspecified: Secondary | ICD-10-CM | POA: Diagnosis not present

## 2021-10-13 DIAGNOSIS — I82542 Chronic embolism and thrombosis of left tibial vein: Secondary | ICD-10-CM

## 2021-10-13 DIAGNOSIS — E1169 Type 2 diabetes mellitus with other specified complication: Secondary | ICD-10-CM | POA: Diagnosis not present

## 2021-10-13 DIAGNOSIS — G9389 Other specified disorders of brain: Secondary | ICD-10-CM | POA: Diagnosis not present

## 2021-10-13 LAB — POCT GLYCOSYLATED HEMOGLOBIN (HGB A1C): Hemoglobin A1C: 9.6 % — AB (ref 4.0–5.6)

## 2021-10-13 MED ORDER — SHINGRIX 50 MCG/0.5ML IM SUSR: 0.5000 mL | Freq: Once | INTRAMUSCULAR | 1 refills | Status: AC

## 2021-10-13 MED ORDER — GADOBUTROL 1 MMOL/ML IV SOLN
7.0000 mL | Freq: Once | INTRAVENOUS | Status: AC | PRN
  Administered 2021-10-13: 7 mL via INTRAVENOUS

## 2021-10-13 MED ORDER — TETANUS-DIPHTH-ACELL PERTUSSIS 5-2-15.5 LF-MCG/0.5 IM SUSP: 0.5000 mL | Freq: Once | INTRAMUSCULAR | 0 refills | Status: AC

## 2021-10-13 NOTE — Patient Instructions (Signed)
Admin Instructions: Pre-meal insulin :  Check fsbs prior to meals  Hold if below 140 If glucose 140 to 200 give 5 units  If glucose above 200 give 10 units

## 2021-10-14 ENCOUNTER — Other Ambulatory Visit: Payer: Self-pay | Admitting: Internal Medicine

## 2021-10-14 LAB — PROTEIN ELECTRO, RANDOM URINE
Albumin ELP, Urine: 30.2 %
Alpha-1-Globulin, U: 2.5 %
Alpha-2-Globulin, U: 17.6 %
Beta Globulin, U: 17.7 %
Gamma Globulin, U: 31.9 %
M Component, Ur: 9.4 % — ABNORMAL HIGH
Total Protein, Urine: 25.2 mg/dL

## 2021-10-16 ENCOUNTER — Encounter: Payer: Self-pay | Admitting: Internal Medicine

## 2021-10-16 NOTE — Progress Notes (Signed)
Spoke patients wife Lattie Haw reviewed the imaging findings. Good response from chemotherapy. Will recommend total of four treatments. Also discussed with Dr. Mickeal Skinner.   Complained of sore throat started today. No fever no chills, no white patches noted. Recommend solid water gargles/Tylenol if not improved to call us for further evaluation.

## 2021-10-20 ENCOUNTER — Inpatient Hospital Stay: Payer: Medicare Other

## 2021-10-31 ENCOUNTER — Other Ambulatory Visit: Payer: Self-pay

## 2021-10-31 ENCOUNTER — Inpatient Hospital Stay (HOSPITAL_BASED_OUTPATIENT_CLINIC_OR_DEPARTMENT_OTHER): Payer: Medicare Other | Admitting: Internal Medicine

## 2021-10-31 ENCOUNTER — Inpatient Hospital Stay: Payer: Medicare Other

## 2021-10-31 ENCOUNTER — Encounter: Payer: Self-pay | Admitting: Internal Medicine

## 2021-10-31 VITALS — BP 134/87 | HR 80 | Temp 97.2°F | Resp 16 | Wt 153.0 lb

## 2021-10-31 DIAGNOSIS — M2550 Pain in unspecified joint: Secondary | ICD-10-CM | POA: Diagnosis not present

## 2021-10-31 DIAGNOSIS — R972 Elevated prostate specific antigen [PSA]: Secondary | ICD-10-CM | POA: Diagnosis not present

## 2021-10-31 DIAGNOSIS — C3412 Malignant neoplasm of upper lobe, left bronchus or lung: Secondary | ICD-10-CM

## 2021-10-31 DIAGNOSIS — Z5112 Encounter for antineoplastic immunotherapy: Secondary | ICD-10-CM | POA: Diagnosis not present

## 2021-10-31 DIAGNOSIS — R5383 Other fatigue: Secondary | ICD-10-CM | POA: Diagnosis not present

## 2021-10-31 DIAGNOSIS — C7931 Secondary malignant neoplasm of brain: Secondary | ICD-10-CM | POA: Diagnosis not present

## 2021-10-31 LAB — PROTEIN / CREATININE RATIO, URINE
Creatinine, Urine: 202 mg/dL
Protein Creatinine Ratio: 0.34 mg/mg{Cre} — ABNORMAL HIGH (ref 0.00–0.15)
Total Protein, Urine: 68 mg/dL

## 2021-10-31 LAB — COMPREHENSIVE METABOLIC PANEL
ALT: 59 U/L — ABNORMAL HIGH (ref 0–44)
AST: 26 U/L (ref 15–41)
Albumin: 3.8 g/dL (ref 3.5–5.0)
Alkaline Phosphatase: 92 U/L (ref 38–126)
Anion gap: 9 (ref 5–15)
BUN: 17 mg/dL (ref 8–23)
CO2: 27 mmol/L (ref 22–32)
Calcium: 9.8 mg/dL (ref 8.9–10.3)
Chloride: 100 mmol/L (ref 98–111)
Creatinine, Ser: 1.11 mg/dL (ref 0.61–1.24)
GFR, Estimated: >60 mL/min (ref >60)
Glucose, Bld: 197 mg/dL — ABNORMAL HIGH (ref 70–99)
Potassium: 3.6 mmol/L (ref 3.5–5.1)
Sodium: 136 mmol/L (ref 135–145)
Total Bilirubin: 0.6 mg/dL (ref 0.3–1.2)
Total Protein: 8.3 g/dL — ABNORMAL HIGH (ref 6.5–8.1)

## 2021-10-31 LAB — CBC WITH DIFFERENTIAL/PLATELET
Abs Immature Granulocytes: 0.1 10*3/uL — ABNORMAL HIGH (ref 0.00–0.07)
Basophils Absolute: 0 10*3/uL (ref 0.0–0.1)
Basophils Relative: 1 %
Eosinophils Absolute: 0 10*3/uL (ref 0.0–0.5)
Eosinophils Relative: 0 %
HCT: 51.8 % (ref 39.0–52.0)
Hemoglobin: 17.7 g/dL — ABNORMAL HIGH (ref 13.0–17.0)
Immature Granulocytes: 1 %
Lymphocytes Relative: 26 %
Lymphs Abs: 1.8 10*3/uL (ref 0.7–4.0)
MCH: 28.4 pg (ref 26.0–34.0)
MCHC: 34.2 g/dL (ref 30.0–36.0)
MCV: 83 fL (ref 80.0–100.0)
Monocytes Absolute: 0.8 10*3/uL (ref 0.1–1.0)
Monocytes Relative: 11 %
Neutro Abs: 4.3 10*3/uL (ref 1.7–7.7)
Neutrophils Relative %: 61 %
Platelets: 193 10*3/uL (ref 150–400)
RBC: 6.24 MIL/uL — ABNORMAL HIGH (ref 4.22–5.81)
RDW: 17 % — ABNORMAL HIGH (ref 11.5–15.5)
WBC: 7.1 10*3/uL (ref 4.0–10.5)
nRBC: 0 % (ref 0.0–0.2)

## 2021-10-31 MED ORDER — SODIUM CHLORIDE 0.9 % IV SOLN
Freq: Once | INTRAVENOUS | Status: AC
  Filled 2021-10-31: qty 250

## 2021-10-31 MED ORDER — HEPARIN SOD (PORK) LOCK FLUSH 100 UNIT/ML IV SOLN
500.0000 [IU] | Freq: Once | INTRAVENOUS | Status: DC | PRN
  Filled 2021-10-31: qty 5

## 2021-10-31 MED ORDER — SODIUM CHLORIDE 0.9 % IV SOLN
15.0000 mg/kg | Freq: Once | INTRAVENOUS | Status: AC
  Administered 2021-10-31: 1000 mg via INTRAVENOUS
  Filled 2021-10-31: qty 32

## 2021-10-31 NOTE — Progress Notes (Signed)
[facility] OFFICE PROGRESS NOTE  Patient Care Team: [name], [name], [name], [name], [facility] (Pharmacist) Ventura Sellers, MD as Consulting Physician (Psychiatry) Billey Co, MD as Consulting Physician (Urology)   Cancer Staging  No matching staging information was found for the patient.   Oncology History Overview Note  # OCT 2017- ADENO CA LUNG; STAGE IV [; LUL; bil supraclavicular LN; Left neck LN Bx]; ROS-1 MUTATED; s/p Carbo-alimta x1[oct 2017]  # NOV 1st 2017- XALKORI 250 mg BID; JAN 15th CT- PR;  # AUG 27th Chemo-RT to persistent LUL/mediastinal LN [s/p carbo-taxol- with RT; finished Sep 22nd 2018];   # July 14th 2020- multiple metastatic lesions of the brain [July 30 whole brain radiation finished April 21, 2019]; bone scan skull metastases /posterior left ninth rib metastases; April 04, 2019 stopped crizotinib;  #April 25, 2019-start Entrectinib 600 mg once a day; STopped in NOV 2020 [dizziness]; NOV 2020-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  # DEC 1st 2020- start avastin q 2w; x6; MRI October 30, 2019-significantly improved left temporal lesion subcentimeter for stable lesions.;  [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   #June 2022-stopped lorlatinib [severe delirium/aggression]; currently on surveillance.  # DEC  2022-MRI brain- ?  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  MRI February 6, 23-significant response  In the interim patient also underwent-HOLEP on May 6.   # LLE DVT/bil PE/Multiple strokes [? 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    # MRI brain- multiple infarcts [2d echo/bubble study-NEG's/p Neurology eval]  ------------------------------------------------------------    # # s/p TURP [Sep 2017; Dr.Cope] DEC 26th CT- distended bladder;  # Elevated PSA-  FEB 2021- 12;  S/p Bx- Negative; follow with urology/Dr.Stoioff   # MOLECULAR TESTING- ROS-1 POSITIVE; ALK/EGFR-NEG; PDL-1 EXPRESSION- 90%** [HIGH]  # DEC 15th 2020- PALLIATIVE CARE -----------------------------------------------------------------    DIAGNOSIS: 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  Accompanied by his wife.  Jamale Maryan Char 71 y.o.  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 radiation necrosis] currently on Avastin is here for follow-up.  Patient denies any further episodes of confusion.  He continues to take dexamethasone 1 mg a day.  Blood sugars poorly controlled.  No leg swelling but weight gain.  No falls.He continues with Eliquis.  Review of Systems  Constitutional:  Positive for malaise/fatigue. Negative for chills, diaphoresis, fever and weight loss.  HENT:  Negative for nosebleeds and sore throat.   Eyes:  Negative for double vision.  Respiratory:  Negative for cough, hemoptysis, sputum production, shortness  of breath and wheezing.   Cardiovascular:  Negative for chest pain (Left upper chest wall pain chronic), palpitations and orthopnea.  Gastrointestinal:  Negative for abdominal pain, blood in stool, constipation, diarrhea, heartburn and melena.  Genitourinary:  Negative for dysuria.  Musculoskeletal:  Positive for joint pain. Negative for back pain.  Skin: Negative.  Negative for itching and rash.  Neurological:  Positive for tingling. Negative for focal weakness and weakness.  Endo/Heme/Allergies:  Does not bruise/bleed easily.  Psychiatric/Behavioral:  Positive for memory loss. Negative for depression. The patient is not nervous/anxious and does not have insomnia.    PAST MEDICAL HISTORY :  Past Medical History:  Diagnosis Date   Abnormal prostate specific antigen 08/08/2012   Adiposity 04/16/2015   Chronic kidney disease (CKD), stage III (moderate) ([place]) 11/27/2016   CVA (cerebral vascular accident) ([place]) 06/17/2016   Diabetes mellitus without complication ([place])    Diverticulosis of sigmoid colon 04/16/2015   Dyslipidemia 03/18/2015   Hemorrhoids, internal 04/16/2015   Hypercholesteremia 04/16/2015   Hyperlipidemia    Hypertension    Primary cancer of left upper lobe of lung ([place])    Pulmonary embolism ([place])    Wears dentures    partial upper    PAST SURGICAL HISTORY :   Past Surgical History:  Procedure Laterality Date   COLONOSCOPY     COLONOSCOPY WITH PROPOFOL N/A 05/06/2015   Procedure: COLONOSCOPY WITH PROPOFOL;  Surgeon: Lucilla Lame, MD;  Location: [facility];  Service: Endoscopy;  Laterality: N/A;  ASCENDING COLON POLYPS X 2 TERMINAL ILEUM BIOPSY RANDOM COLON BX. TRANSVERSE COLON POLYP SIGMOID COLON POLYP   ESOPHAGOGASTRODUODENOSCOPY (EGD) WITH PROPOFOL N/A 05/06/2015   Procedure: ESOPHAGOGASTRODUODENOSCOPY (EGD) WITH PROPOFOL;  Surgeon: Lucilla Lame, MD;  Location: [facility];  Service: Endoscopy;  Laterality: N/A;  GASTRIC BIOPSY X1   HOLEP-LASER ENUCLEATION  OF THE PROSTATE WITH MORCELLATION N/A 01/10/2021   Procedure: HOLEP-LASER ENUCLEATION OF THE PROSTATE WITH MORCELLATION;  Surgeon: Billey Co, MD;  Location: [facility];  Service: Urology;  Laterality: N/A;   IVC FILTER INSERTION N/A 12/29/2020   Procedure: IVC FILTER INSERTION;  Surgeon: Dwana Curd, MD;  Location: [facility];  Service: Cardiovascular;  Laterality: N/A;   KIDNEY STONE SURGERY  2017    FAMILY HISTORY :   Family History  Problem Relation Age of Onset   Diabetes Mother    Diabetes Father    CAD Father    Dementia Father    Diabetes Sister    Cancer Maternal Uncle        Prostate   Cancer Cousin        prostate    SOCIAL HISTORY:   Social History   Tobacco Use   Smoking status: Never   Smokeless tobacco: Never   Tobacco comments:    Smoking cessation materials not required  Vaping Use   Vaping Use: Never used  Substance Use Topics   Alcohol use: No    Alcohol/week: 0.0 standard drinks   Drug use: No    ALLERGIES:  has No Known Allergies.  MEDICATIONS:  Current Outpatient Medications  Medication Sig Dispense Refill   amLODipine (NORVASC) 5 MG tablet Take 1 tablet (5 mg total) by mouth daily. 30 tablet 3   apixaban (ELIQUIS) 2.5 MG TABS tablet Take 1 tablet (2.5 mg total) by mouth 2 (two) times daily. 60 tablet 3   atorvastatin (LIPITOR) 40 MG tablet Take 1 tablet  by mouth once daily 90 tablet 0   blood glucose meter kit and supplies Dispense based on patient and insurance preference (Accuchek Aviva Plus, Accuchek Nano). Use up to four times daily as directed. (FOR ICD-10 E10.9, E11.9). 1 each 0   dexamethasone (DECADRON) 1 MG tablet Take 1 tablet (1 mg total) by mouth daily. (Patient taking differently: Take 1 mg by mouth daily. 0.5 tab QD) 60 tablet 1   gabapentin (NEURONTIN) 300 MG capsule Take 1 capsule by mouth at bedtime 90 capsule 2   Insulin Pen Needle (NOVOFINE PEN NEEDLE) 32G X 6 MM MISC 1 each by Does not apply route daily. 100  each 2   insulin regular human CONCENTRATED (HUMULIN R U-500 KWIKPEN) 500 UNIT/ML KwikPen Inject 1-10 Units into the skin 3 (three) times daily with meals. Pre-meal insulin :  Check fsbs prior to meals  Hold if below 130 If glucose 130- 140 give 2 units If glucose 140 to 180 give 4 units  If glucose 180 to 250 give 6 units If glucose above 250 give 8 units 15 mL 0   levETIRAcetam (KEPPRA) 500 MG tablet Take 1 tablet by mouth twice daily 60 tablet 0   SOLIQUA 100-33 UNT-MCG/ML SOPN INJECT 15 TO 30 UNITS  SUBCUTANEOUSLY ONCE DAILY 15 mL 0   lorlatinib (LORBRENA) 25 MG tablet Take 2 tablets (50 mg total) by mouth daily. (Patient not taking: Reported on 10/31/2021) 60 tablet 1   No current facility-administered medications for this visit.   Facility-Administered Medications Ordered in Other Visits  Medication Dose Route Frequency Provider Last Rate Last Admin   bevacizumab-awwb (MVASI) 1,000 mg in sodium chloride 0.9 % 100 mL chemo infusion  15 mg/kg (Order-Specific) Intravenous Once Charlaine Dalton R, MD 280 mL/hr at 10/31/21 1509 1,000 mg at 10/31/21 1509   heparin lock flush 100 unit/mL  500 Units Intracatheter Once PRN Cammie Sickle, MD        PHYSICAL EXAMINATION: ECOG PERFORMANCE STATUS: 0 - Asymptomatic  BP 134/87    Pulse 80    Temp (!) 97.2 F (36.2 C)    Resp 16    Wt 153 lb (69.4 kg)    BMI 25.46 kg/m   Filed Weights   10/31/21 1400  Weight: 153 lb (69.4 kg)    Physical Exam HENT:     Head: Normocephalic and atraumatic.     Mouth/Throat:     Pharynx: No oropharyngeal exudate.  Eyes:     Pupils: Pupils are equal, round, and reactive to light.  Cardiovascular:     Rate and Rhythm: Normal rate and regular rhythm.  Pulmonary:     Effort: No respiratory distress.     Breath sounds: No wheezing.     Comments: Decreased breath sounds bilaterally no wheeze or crackles. Abdominal:     General: Bowel sounds are normal. There is no distension.     Palpations:  Abdomen is soft. There is no mass.     Tenderness: There is no abdominal tenderness. There is no guarding or rebound.  Musculoskeletal:        General: No tenderness. Normal range of motion.     Cervical back: Normal range of motion and neck supple.  Skin:    General: Skin is warm.  Neurological:     Mental Status: He is alert and oriented to person, place, and time.  Psychiatric:        Mood and Affect: Affect normal.   LABORATORY DATA:  I have reviewed  the data as listed    Component Value Date/Time   NA 136 10/31/2021 1351   NA 139 01/01/2016 1000   K 3.6 10/31/2021 1351   CL 100 10/31/2021 1351   CO2 27 10/31/2021 1351   GLUCOSE 197 (H) 10/31/2021 1351   BUN 17 10/31/2021 1351   BUN 13 01/01/2016 1000   CREATININE 1.11 10/31/2021 1351   CREATININE 1.20 01/09/2021 1226   CALCIUM 9.8 10/31/2021 1351   PROT 8.3 (H) 10/31/2021 1351   PROT 7.2 01/01/2016 1000   ALBUMIN 3.8 10/31/2021 1351   ALBUMIN 4.4 01/01/2016 1000   AST 26 10/31/2021 1351   ALT 59 (H) 10/31/2021 1351   ALKPHOS 92 10/31/2021 1351   BILITOT 0.6 10/31/2021 1351   BILITOT 0.4 01/01/2016 1000   GFRNONAA >60 10/31/2021 1351   GFRNONAA 62 01/09/2021 1226   GFRAA 72 01/09/2021 1226    No results found for: SPEP, UPEP  Lab Results  Component Value Date   WBC 7.1 10/31/2021   NEUTROABS 4.3 10/31/2021   HGB 17.7 (H) 10/31/2021   HCT 51.8 10/31/2021   MCV 83.0 10/31/2021   PLT 193 10/31/2021      Chemistry      Component Value Date/Time   NA 136 10/31/2021 1351   NA 139 01/01/2016 1000   K 3.6 10/31/2021 1351   CL 100 10/31/2021 1351   CO2 27 10/31/2021 1351   BUN 17 10/31/2021 1351   BUN 13 01/01/2016 1000   CREATININE 1.11 10/31/2021 1351   CREATININE 1.20 01/09/2021 1226      Component Value Date/Time   CALCIUM 9.8 10/31/2021 1351   ALKPHOS 92 10/31/2021 1351   AST 26 10/31/2021 1351   ALT 59 (H) 10/31/2021 1351   BILITOT 0.6 10/31/2021 1351   BILITOT 0.4 01/01/2016 1000        RADIOGRAPHIC STUDIES: I have personally reviewed the radiological images as listed and agreed with the findings in the report. No results found.   ASSESSMENT & PLAN:  Primary cancer of left upper lobe of lung ([place]) #Brain metastases - MAY 2022-s/p WBRT- MRI brain DEC 2022-progressive disease Vs. Radiation necrosis- currently on Avastin #3 q 3W. FEB 6th, 2023-decreased size of the left frontal left temporal and the ventral midbrain metastases.  No new lesions noted.    #I reviewed with the patient / patients wife Lattie Haw reviewed the imaging findings. Good response from to avastin. Will recommend total of four treatments.  Proceed with Avastin No. 4.  Also discussed with Dr. Mickeal Skinner.  Will use reimage brain about 3 months.  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  CT chest in 6 weeks  # Elevated BG- PBF- 197- on dex 44m once a day; taper off dex.     # Elevated BP- ? Avastin;[140/09s] recommend continue checking at home; continue  amlodipine 5 mg day.   # Bilateral PE & left lower extremity DVT:s/p IVC filter; currently on eliquis 2.5 g BID [5/13]-STABLE  # CKD stage III-creatinine 1.5 [GFR~51]- STABLE;   # Peripheral Neuropathy- STABLE;  DM;continue  gabapentin 200 mg qhs-stable  # IV access: PIV  # Vaccinations: ok with tetanus; shingles shot.   # DISPOSITION:   # Avastin today # follow up in  6 weeks- --MD; labs cbc/cmp;urine protein creatinine ratio; NO treatment; CT Chest prior--Dr.B  # I reviewed the blood work- with the patient in detail; also  reviewed the imaging independently [as summarized above]; and with the patient in detail.    Cc; Dr.Sowles.    Orders Placed This Encounter  Procedures   CT CHEST WO CONTRAST    Standing Status:   Future    Standing Expiration Date:   10/31/2022    Order Specific Question:   Preferred imaging location?    Answer:    [facility]    Order Specific Question:   Radiology Contrast Protocol - do NOT remove file path    Answer:   [email or web address]    All questions were answered. The patient knows to call the clinic with any problems, questions or concerns.      Cammie Sickle, MD 10/31/2021 3:37 PM

## 2021-10-31 NOTE — Assessment & Plan Note (Addendum)
#  Brain metastases - MAY 2022-s/p WBRT- MRI brain DEC 2022-progressive disease Vs. Radiation necrosis- currently on Avastin #3 q 3W. FEB 6th, 2023-decreased size of the left frontal left temporal and the ventral midbrain metastases.  No new lesions noted.   #I reviewed with the patient / patients wife Lattie Haw reviewed the imaging findings. Good response from to avastin. Will recommend total of four treatments.  Proceed with Avastin No. 4.  Also discussed with Dr. Mickeal Skinner.  Will use reimage brain about 3 months.  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  CT chest in 6 weeks  # Elevated BG- PBF- 197- on dex 51m once a day; taper off dex.   # Elevated BP- ? Avastin;[140/09s] recommend continue checking at home; continue  amlodipine 5 mg day.   # Bilateral PE & left lower extremity DVT:s/p IVC filter; currently on eliquis 2.5 g BID [5/13]-STABLE  # CKD stage III-creatinine 1.5 [GFR~51]- STABLE;   # Peripheral Neuropathy- STABLE;  DM;continue  gabapentin 200 mg qhs-stable  # IV access: PIV  # Vaccinations: ok with tetanus; shingles shot.   # DISPOSITION:   # Avastin today # follow up in  6 weeks- --MD; labs cbc/cmp;urine protein creatinine ratio; NO treatment; CT Chest prior--Dr.B  # I reviewed the blood work- with the patient in detail; also reviewed the imaging independently [as summarized above]; and with the patient in detail.    Cc; Dr.Sowles.

## 2021-10-31 NOTE — Progress Notes (Signed)
Per [name] may use urine protein from 10/10/21 for today's tx

## 2021-10-31 NOTE — Progress Notes (Signed)
Patient does have occasional acid reflux that is relieved by OTC medication.    Blood pressure on office check is 134/87.

## 2021-10-31 NOTE — Patient Instructions (Signed)
[facility] AT [place]  Discharge Instructions: Thank you for choosing [facility] to provide your oncology and hematology care.  If you have a lab appointment with the [facility], please go directly to the [facility] and check in at the registration area.  Wear comfortable clothing and clothing appropriate for easy access to any Portacath or PICC line.   We strive to give you quality time with your provider. You may need to reschedule your appointment if you arrive late (15 or more minutes).  Arriving late affects you and other patients whose appointments are after yours.  Also, if you miss three or more appointments without notifying the office, you may be dismissed from the clinic at the providers discretion.      For prescription refill requests, have your pharmacy contact our office and allow 72 hours for refills to be completed.    Today you received the following chemotherapy and/or immunotherapy agents: Avastin        To help prevent nausea and vomiting after your treatment, we encourage you to take your nausea medication as directed.  BELOW ARE SYMPTOMS THAT SHOULD BE REPORTED IMMEDIATELY: *FEVER GREATER THAN 100.4 F (38 C) OR HIGHER *CHILLS OR SWEATING *NAUSEA AND VOMITING THAT IS NOT CONTROLLED WITH YOUR NAUSEA MEDICATION *UNUSUAL SHORTNESS OF BREATH *UNUSUAL BRUISING OR BLEEDING *URINARY PROBLEMS (pain or burning when urinating, or frequent urination) *BOWEL PROBLEMS (unusual diarrhea, constipation, pain near the anus) TENDERNESS IN MOUTH AND THROAT WITH OR WITHOUT PRESENCE OF ULCERS (sore throat, sores in mouth, or a toothache) UNUSUAL RASH, SWELLING OR PAIN  UNUSUAL VAGINAL DISCHARGE OR ITCHING   Items with * indicate a potential emergency and should be followed up as soon as possible or go to the Emergency Department if any problems should occur.  Please show the CHEMOTHERAPY ALERT CARD or IMMUNOTHERAPY ALERT CARD at check-in to  the Emergency Department and triage nurse.  Should you have questions after your visit or need to cancel or reschedule your appointment, please contact [facility] [place] [facility] [place]  [phone] and follow the prompts.  Office hours are 8:00 a.m. to 4:30 p.m. Monday - Friday. Please note that voicemails left after 4:00 p.m. may not be returned until the following business day.  We are closed weekends and major holidays. You have access to a nurse at all times for urgent questions. Please call the main number to the clinic [phone] and follow the prompts.  For any non-urgent questions, you may also contact your provider using MyChart. We now offer e-Visits for anyone 55 and older to request care online for non-urgent symptoms. For details visit [email or web address].   Also download the MyChart app! Go to the app store, search "MyChart", open the app, select [facility], and log in with your MyChart username and password.  Due to Covid, a mask is required upon entering the hospital/clinic. If you do not have a mask, one will be given to you upon arrival. For doctor visits, patients may have 1 support person aged 74 or older with them. For treatment visits, patients cannot have anyone with them due to current Covid guidelines and our immunocompromised population. Bevacizumab injection What is this medication? BEVACIZUMAB (be va SIZ yoo mab) is a monoclonal antibody. It is used to treat many types of cancer. This medicine may be used for other purposes; ask your health care provider or pharmacist if you have questions. COMMON BRAND NAME(S): Alymsys, Avastin, MVASI, Zirabev What should I tell  my care team before I take this medication? They need to know if you have any of these conditions: diabetes heart disease high blood pressure history of coughing up blood prior anthracycline chemotherapy (e.g., doxorubicin, daunorubicin, epirubicin) recent or ongoing radiation  therapy recent or planning to have surgery stroke an unusual or allergic reaction to bevacizumab, hamster proteins, mouse proteins, other medicines, foods, dyes, or preservatives pregnant or trying to get pregnant breast-feeding How should I use this medication? This medicine is for infusion into a vein. It is given by a health care professional in a hospital or clinic setting. Talk to your pediatrician regarding the use of this medicine in children. Special care may be needed. Overdosage: If you think you have taken too much of this medicine contact a poison control center or emergency room at once. NOTE: This medicine is only for you. Do not share this medicine with others. What if I miss a dose? It is important not to miss your dose. Call your doctor or health care professional if you are unable to keep an appointment. What may interact with this medication? Interactions are not expected. This list may not describe all possible interactions. Give your health care provider a list of all the medicines, herbs, non-prescription drugs, or dietary supplements you use. Also tell them if you smoke, drink alcohol, or use illegal drugs. Some items may interact with your medicine. What should I watch for while using this medication? Your condition will be monitored carefully while you are receiving this medicine. You will need important blood work and urine testing done while you are taking this medicine. This medicine may increase your risk to bruise or bleed. Call your doctor or health care professional if you notice any unusual bleeding. Before having surgery, talk to your health care provider to make sure it is ok. This drug can increase the risk of poor healing of your surgical site or wound. You will need to stop this drug for 28 days before surgery. After surgery, wait at least 28 days before restarting this drug. Make sure the surgical site or wound is healed enough before restarting this drug.  Talk to your health care provider if questions. Do not become pregnant while taking this medicine or for 6 months after stopping it. Women should inform their doctor if they wish to become pregnant or think they might be pregnant. There is a potential for serious side effects to an unborn child. Talk to your health care professional or pharmacist for more information. Do not breast-feed an infant while taking this medicine and for 6 months after the last dose. This medicine has caused ovarian failure in some women. This medicine may interfere with the ability to have a child. You should talk to your doctor or health care professional if you are concerned about your fertility. What side effects may I notice from receiving this medication? Side effects that you should report to your doctor or health care professional as soon as possible: allergic reactions like skin rash, itching or hives, swelling of the face, lips, or tongue chest pain or chest tightness chills coughing up blood high fever seizures severe constipation signs and symptoms of bleeding such as bloody or black, tarry stools; red or dark-brown urine; spitting up blood or brown material that looks like coffee grounds; red spots on the skin; unusual bruising or bleeding from the eye, gums, or nose signs and symptoms of a blood clot such as breathing problems; chest pain; severe, sudden headache; pain,  swelling, warmth in the leg signs and symptoms of a stroke like changes in vision; confusion; trouble speaking or understanding; severe headaches; sudden numbness or weakness of the face, arm or leg; trouble walking; dizziness; loss of balance or coordination stomach pain sweating swelling of legs or ankles vomiting weight gain Side effects that usually do not require medical attention (report to your doctor or health care professional if they continue or are bothersome): back pain changes in taste decreased appetite dry  skin nausea tiredness This list may not describe all possible side effects. Call your doctor for medical advice about side effects. You may report side effects to FDA at 1-800-FDA-1088. Where should I keep my medication? This drug is given in a hospital or clinic and will not be stored at home. NOTE: This sheet is a summary. It may not cover all possible information. If you have questions about this medicine, talk to your doctor, pharmacist, or health care provider.  2022 [employer]/Gold Standard (2021-05-13 00:00:00)

## 2021-11-04 ENCOUNTER — Other Ambulatory Visit: Payer: Self-pay | Admitting: Family Medicine

## 2021-11-11 NOTE — Progress Notes (Signed)
Name: Melvin Willis   MRN: [id number]    DOB: [birth year]   Date:11/12/2021       Progress Note  Subjective  Chief Complaint  Follow Up  HPI  DMII: he is on Soliqua  26 units daily and his fasting glucose was controlled, but he was placed on dexamethasone tablets 2 mg daily but over the past 8 weeks only on half dose.  Glucose over the past week has improved from 88-140's except for this morning that spiked to 222 likely because he had apple pie and ice cream last night.  We had discussed with wife going up on Soliqua but she decided to just change his diet. We gave him Humulin R to use pre-meal 5-10 units per meals but wife is only using occasionally , explained she can safely give him 2 units before every meal if worried about hypoglycemia.   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   Hyperlipidemia associated with type 2 diabetes mellitus ([place]) 02/05/2021   Deep vein thrombosis (DVT) of proximal lower extremity (HCC)    Urinary retention 12/29/2020   Leg DVT (deep venous thromboembolism), acute, bilateral ([place]) 12/29/2020   Delirium    Palliative care encounter    Pneumonia due to infectious organism    Supratherapeutic INR 11/14/2020   Acute-on-chronic kidney injury ([place]) 11/14/2020   Brain metastasis ([place]) 09/25/2019   Atherosclerosis of aorta ([place]) 09/04/2019   Primary malignant neoplasm of lung metastatic to other site [facility]) 04/07/2019   Chronic deep vein thrombosis (DVT) of left lower extremity ([place]) 01/05/2018   Chronic kidney disease (CKD), stage III (moderate) ([place]) 11/27/2016   Blurred vision, bilateral 06/23/2016   Primary cancer of left upper lobe of lung ([place]) 06/12/2016   Mediastinal mass    History of nephrolithiasis 03/27/2016   Pharyngeal dysphagia 01/01/2016   Benign neoplasm of ascending colon    Benign neoplasm of sigmoid colon    Benign neoplasm of transverse colon     Diverticulosis of large intestine without diverticulitis    Overweight (BMI 25.0-29.9) 04/16/2015   Type 2 diabetes mellitus with stage 3 chronic kidney disease ([place]) 03/18/2015   Dyslipidemia 03/18/2015   Disorder of male genital organ 08/08/2012   Nodular prostate with urinary obstruction 08/08/2012   Elevated PSA 08/08/2012    Past Surgical History:  Procedure Laterality Date   COLONOSCOPY     COLONOSCOPY WITH PROPOFOL N/A 05/06/2015   Procedure: COLONOSCOPY WITH PROPOFOL;  Surgeon: Lucilla Lame, MD;  Location: [facility];  Service: Endoscopy;  Laterality: N/A;  ASCENDING COLON POLYPS X 2 TERMINAL ILEUM BIOPSY RANDOM COLON BX. TRANSVERSE COLON POLYP SIGMOID COLON POLYP   ESOPHAGOGASTRODUODENOSCOPY (EGD) WITH PROPOFOL N/A 05/06/2015   Procedure: ESOPHAGOGASTRODUODENOSCOPY (EGD) WITH PROPOFOL;  Surgeon: Lucilla Lame, MD;  Location: [facility];  Service: Endoscopy;  Laterality: N/A;  GASTRIC BIOPSY X1   HOLEP-LASER ENUCLEATION OF THE PROSTATE WITH MORCELLATION N/A 01/10/2021   Procedure: HOLEP-LASER ENUCLEATION OF THE PROSTATE WITH MORCELLATION;  Surgeon: Billey Co, MD;  Location: [facility];  Service: Urology;  Laterality: N/A;   IVC FILTER INSERTION N/A 12/29/2020   Procedure: IVC FILTER INSERTION;  Surgeon: Dwana Curd, MD;  Location: [place];  Service: Cardiovascular;  Laterality: N/A;   KIDNEY STONE SURGERY  2017    Family History  Problem Relation Age of Onset   Diabetes Mother    Diabetes  Father    CAD Father    Dementia Father    Diabetes Sister    Cancer Maternal Uncle        Prostate   Cancer Cousin        prostate    Social History   Tobacco Use   Smoking status: Never   Smokeless tobacco: Never   Tobacco comments:    Smoking cessation materials not required  Substance Use Topics   Alcohol use: No    Alcohol/week: 0.0 standard drinks     Current Outpatient Medications:    amLODipine (NORVASC) 5 MG tablet, Take 1  tablet (5 mg total) by mouth daily., Disp: 30 tablet, Rfl: 3   apixaban (ELIQUIS) 2.5 MG TABS tablet, Take 1 tablet (2.5 mg total) by mouth 2 (two) times daily., Disp: 60 tablet, Rfl: 3   atorvastatin (LIPITOR) 40 MG tablet, Take 1 tablet by mouth once daily, Disp: 90 tablet, Rfl: 0   blood glucose meter kit and supplies, Dispense based on patient and insurance preference (Accuchek Aviva Plus, Accuchek Nano). Use up to four times daily as directed. (FOR ICD-10 E10.9, E11.9)., Disp: 1 each, Rfl: 0   dexamethasone (DECADRON) 1 MG tablet, Take 1 tablet (1 mg total) by mouth daily. (Patient taking differently: Take 1 mg by mouth daily. 0.5 tab QD), Disp: 60 tablet, Rfl: 1   gabapentin (NEURONTIN) 300 MG capsule, Take 1 capsule by mouth at bedtime, Disp: 90 capsule, Rfl: 2   Insulin Pen Needle (NOVOFINE PEN NEEDLE) 32G X 6 MM MISC, 1 each by Does not apply route daily., Disp: 100 each, Rfl: 2   insulin regular human CONCENTRATED (HUMULIN R U-500 KWIKPEN) 500 UNIT/ML KwikPen, Inject 1-10 Units into the skin 3 (three) times daily with meals. Pre-meal insulin :  Check fsbs prior to meals  Hold if below 130 If glucose 130- 140 give 2 units If glucose 140 to 180 give 4 units  If glucose 180 to 250 give 6 units If glucose above 250 give 8 units, Disp: 15 mL, Rfl: 0   levETIRAcetam (KEPPRA) 500 MG tablet, Take 1 tablet by mouth twice daily, Disp: 60 tablet, Rfl: 0   lorlatinib (LORBRENA) 25 MG tablet, Take 2 tablets (50 mg total) by mouth daily., Disp: 60 tablet, Rfl: 1   SOLIQUA 100-33 UNT-MCG/ML SOPN, INJECT 15 TO 30  SUBCUTANEOUSLY ONCE DAILY, Disp: 15 mL, Rfl: 0  No Known Allergies  I personally reviewed active problem list, medication list, allergies, family history, social history, health maintenance with the patient/caregiver today.   ROS  Ten systems reviewed and is negative except as mentioned in HPI   Objective  Vitals:   11/12/21 1045  BP: 120/82  Pulse: 86  Resp: 16  SpO2: 95%  Weight:  154 lb (69.9 kg)  Height: '5\' 4"'  (1.626 m)    Body mass index is 26.43 kg/m.  Physical Exam  Constitutional: Patient appears well-developed and well-nourished.  No distress.  HEENT: head atraumatic, normocephalic, pupils equal and reactive to light Cardiovascular: Normal rate, regular rhythm and normal heart sounds.  No murmur heard. No BLE edema. Pulmonary/Chest: Effort normal and breath sounds normal. No respiratory distress. Abdominal: Soft.  There is no tenderness. Psychiatric: looks at his wife for reassurance, cooperative and quiet   Recent Results (from the past 2160 hour(s))  Comprehensive metabolic panel     Status: Abnormal   Collection Time: 08/22/21  9:25 AM  Result Value Ref Range   Sodium 131 (L) 135 -  145 mmol/L   Potassium 4.1 3.5 - 5.1 mmol/L   Chloride 96 (L) 98 - 111 mmol/L   CO2 25 22 - 32 mmol/L   Glucose, Bld 353 (H) 70 - 99 mg/dL    Comment: Glucose reference range applies only to samples taken after fasting for at least 8 hours.   BUN 23 8 - 23 mg/dL   Creatinine, Ser 1.35 (H) 0.61 - 1.24 mg/dL   Calcium 9.4 8.9 - 10.3 mg/dL   Total Protein 7.6 6.5 - 8.1 g/dL   Albumin 4.0 3.5 - 5.0 g/dL   AST 20 15 - 41 U/L   ALT 57 (H) 0 - 44 U/L   Alkaline Phosphatase 108 38 - 126 U/L   Total Bilirubin 0.9 0.3 - 1.2 mg/dL   GFR, Estimated 57 (L) >60 mL/min    Comment: (NOTE) Calculated using the CKD-EPI Creatinine Equation (2021)    Anion gap 10 5 - 15    Comment: Performed at [facility], [address] [place], [place] [address]  CBC with Differential/Platelet     Status: Abnormal   Collection Time: 08/22/21  9:25 AM  Result Value Ref Range   WBC 10.8 (H) 4.0 - 10.5 K/uL   RBC 6.30 (H) 4.22 - 5.81 MIL/uL   Hemoglobin 16.8 13.0 - 17.0 g/dL   HCT 50.4 39.0 - 52.0 %   MCV 80.0 80.0 - 100.0 fL   MCH 26.7 26.0 - 34.0 pg   MCHC 33.3 30.0 - 36.0 g/dL   RDW 15.7 (H) 11.5 - 15.5 %   Platelets 248 150 - 400 K/uL   nRBC 0.0 0.0 - 0.2 %   Neutrophils  Relative % 80 %   Neutro Abs 8.7 (H) 1.7 - 7.7 K/uL   Lymphocytes Relative 14 %   Lymphs Abs 1.5 0.7 - 4.0 K/uL   Monocytes Relative 6 %   Monocytes Absolute 0.6 0.1 - 1.0 K/uL   Eosinophils Relative 0 %   Eosinophils Absolute 0.0 0.0 - 0.5 K/uL   Basophils Relative 0 %   Basophils Absolute 0.0 0.0 - 0.1 K/uL   Immature Granulocytes 0 %   Abs Immature Granulocytes 0.03 0.00 - 0.07 K/uL    Comment: Performed at [facility], [address] [place], [place] [address]  Protein, urine, random     Status: None   Collection Time: 08/22/21 11:15 AM  Result Value Ref Range   Total Protein, Urine 9 mg/dL    Comment: NO NORMAL RANGE ESTABLISHED FOR THIS TEST Performed at [facility], [address] [place], [place] [address]   POCT Glucose (CBG)     Status: Abnormal   Collection Time: 08/25/21  4:22 PM  Result Value Ref Range   POC Glucose 122 (A) 70 - 99 mg/dl  Bladder Scan (Post Void Residual) in office     Status: None   Collection Time: 09/18/21  9:58 AM  Result Value Ref Range   Scan Result 98m   CBC with Differential     Status: Abnormal   Collection Time: 09/19/21  9:12 AM  Result Value Ref Range   WBC 6.5 4.0 - 10.5 K/uL   RBC 5.84 (H) 4.22 - 5.81 MIL/uL   Hemoglobin 16.1 13.0 - 17.0 g/dL   HCT 49.0 39.0 - 52.0 %   MCV 83.9 80.0 - 100.0 fL   MCH 27.6 26.0 - 34.0 pg   MCHC 32.9 30.0 - 36.0 g/dL   RDW 18.0 (H) 11.5 -  15.5 %   Platelets 102 (L) 150 - 400 K/uL    Comment: SPECIMEN CHECKED FOR CLOTS   nRBC 0.0 0.0 - 0.2 %   Neutrophils Relative % 55 %   Neutro Abs 3.6 1.7 - 7.7 K/uL   Lymphocytes Relative 29 %   Lymphs Abs 1.9 0.7 - 4.0 K/uL   Monocytes Relative 13 %   Monocytes Absolute 0.8 0.1 - 1.0 K/uL   Eosinophils Relative 1 %   Eosinophils Absolute 0.1 0.0 - 0.5 K/uL   Basophils Relative 1 %   Basophils Absolute 0.0 0.0 - 0.1 K/uL   Immature Granulocytes 1 %   Abs Immature Granulocytes 0.09 (H) 0.00 - 0.07 K/uL    Comment: Performed at [facility], [address] [place], [place] [address]  Comprehensive metabolic panel     Status: Abnormal   Collection Time: 09/19/21  9:12 AM  Result Value Ref Range   Sodium 129 (L) 135 - 145 mmol/L   Potassium 4.0 3.5 - 5.1 mmol/L   Chloride 99 98 - 111 mmol/L   CO2 26 22 - 32 mmol/L   Glucose, Bld 254 (H) 70 - 99 mg/dL    Comment: Glucose reference range applies only to samples taken after fasting for at least 8 hours.   BUN 22 8 - 23 mg/dL   Creatinine, Ser 1.01 0.61 - 1.24 mg/dL   Calcium 8.7 (L) 8.9 - 10.3 mg/dL   Total Protein 6.4 (L) 6.5 - 8.1 g/dL   Albumin 3.3 (L) 3.5 - 5.0 g/dL   AST 18 15 - 41 U/L   ALT 38 0 - 44 U/L   Alkaline Phosphatase 62 38 - 126 U/L   Total Bilirubin 0.5 0.3 - 1.2 mg/dL   GFR, Estimated >60 >60 mL/min    Comment: (NOTE) Calculated using the CKD-EPI Creatinine Equation (2021)    Anion gap 4 (L) 5 - 15    Comment: Performed at [facility], [address] [place], [place] [address]  Protein Electro, Random Urine     Status: Abnormal   Collection Time: 09/19/21  9:12 AM  Result Value Ref Range   Total Protein, Urine 17.9 Not Estab. mg/dL   Albumin ELP, Urine 25.0 %   Alpha-1-Globulin, U 4.9 %   Alpha-2-Globulin, U 14.1 %   Beta Globulin, U 27.9 %   Gamma Globulin, U 28.2 %   M Component, Ur 10.7 (H) Not Observed %   Please Note: Comment     Comment: (NOTE) Protein electrophoresis scan will follow via computer, mail, or courier delivery. Performed At: [facility] [place], [place] [id number] Rush Farmer MD [id number]   Multiple Myeloma Panel (SPEP&IFE w/QIG)     Status: Abnormal   Collection Time: 09/26/21  9:40 AM  Result Value Ref Range   IgG (Immunoglobin G), Serum 887 603 - 1,613 mg/dL   IgA 530 (H) 61 - 437 mg/dL   IgM (Immunoglobulin M), Srm 51 20 - 172 mg/dL   Total Protein ELP 6.6 6.0 - 8.5 g/dL   Albumin SerPl Elph-Mcnc 3.1 2.9 - 4.4 g/dL   Alpha 1 0.3 0.0 - 0.4 g/dL   Alpha2 Glob  SerPl Elph-Mcnc 0.8 0.4 - 1.0 g/dL   B-Globulin SerPl Elph-Mcnc 1.5 (H) 0.7 - 1.3 g/dL   Gamma Glob SerPl Elph-Mcnc 0.9 0.4 - 1.8 g/dL   M Protein SerPl Elph-Mcnc Comment (A) Not Observed g/dL    Comment: (NOTE) Immunofixation detected an M-spike in  the beta fraction of the Protein Electrophoresis; unable to quantify due to the co-migration with other proteins.    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    Please Note Comment     Comment: (NOTE) Protein electrophoresis scan will follow via computer, mail, or courier delivery. Performed At: [facility] [place], [place] [id number] Rush Farmer MD [id number]   Kappa/lambda light chains     Status: Abnormal   Collection Time: 09/26/21  9:40 AM  Result Value Ref Range   Kappa free light chain 86.9 (H) 3.3 - 19.4 mg/L   Lambda free light chains 18.0 5.7 - 26.3 mg/L   Kappa, lambda light chain ratio 4.83 (H) 0.26 - 1.65    Comment: (NOTE) Performed At: [facility] [place] [place], [place] [id number] Rush Farmer MD [id number]   Comprehensive metabolic panel     Status: Abnormal   Collection Time: 09/26/21  9:40 AM  Result Value Ref Range   Sodium 136 135 - 145 mmol/L   Potassium 3.9 3.5 - 5.1 mmol/L   Chloride 102 98 - 111 mmol/L   CO2 25 22 - 32 mmol/L   Glucose, Bld 218 (H) 70 - 99 mg/dL    Comment: Glucose reference range applies only to samples taken after fasting for at least 8 hours.   BUN 16 8 - 23 mg/dL   Creatinine, Ser 1.16 0.61 - 1.24 mg/dL   Calcium 9.5 8.9 - 10.3 mg/dL   Total Protein 7.1 6.5 - 8.1 g/dL   Albumin 3.8 3.5 - 5.0 g/dL   AST 27 15 - 41 U/L   ALT 64 (H) 0 - 44 U/L   Alkaline Phosphatase 74 38 - 126 U/L   Total Bilirubin 0.6 0.3 - 1.2 mg/dL   GFR, Estimated >60 >60 mL/min    Comment:  (NOTE) Calculated using the CKD-EPI Creatinine Equation (2021)    Anion gap 9 5 - 15    Comment: Performed at [facility], [address] [place], [place] [address]  CBC with Differential/Platelet     Status: Abnormal   Collection Time: 09/26/21  9:40 AM  Result Value Ref Range   WBC 4.7 4.0 - 10.5 K/uL   RBC 5.96 (H) 4.22 - 5.81 MIL/uL   Hemoglobin 16.7 13.0 - 17.0 g/dL   HCT 49.9 39.0 - 52.0 %   MCV 83.7 80.0 - 100.0 fL   MCH 28.0 26.0 - 34.0 pg   MCHC 33.5 30.0 - 36.0 g/dL   RDW 17.2 (H) 11.5 - 15.5 %   Platelets 127 (L) 150 - 400 K/uL   nRBC 0.0 0.0 - 0.2 %   Neutrophils Relative % 59 %   Neutro Abs 2.7 1.7 - 7.7 K/uL   Lymphocytes Relative 28 %   Lymphs Abs 1.3 0.7 - 4.0 K/uL   Monocytes Relative 11 %   Monocytes Absolute 0.5 0.1 - 1.0 K/uL   Eosinophils Relative 1 %   Eosinophils Absolute 0.0 0.0 - 0.5 K/uL   Basophils Relative 0 %   Basophils Absolute 0.0 0.0 - 0.1 K/uL   Immature Granulocytes 1 %   Abs Immature Granulocytes 0.04 0.00 - 0.07 K/uL    Comment: Performed at [facility], [address], [place], [place] [address]  CBC with Differential  Status: Abnormal   Collection Time: 10/10/21  8:59 AM  Result Value Ref Range   WBC 7.6 4.0 - 10.5 K/uL   RBC 6.27 (H) 4.22 - 5.81 MIL/uL   Hemoglobin 17.3 (H) 13.0 - 17.0 g/dL   HCT 51.9 39.0 - 52.0 %   MCV 82.8 80.0 - 100.0 fL   MCH 27.6 26.0 - 34.0 pg   MCHC 33.3 30.0 - 36.0 g/dL   RDW 15.8 (H) 11.5 - 15.5 %   Platelets 202 150 - 400 K/uL   nRBC 0.0 0.0 - 0.2 %   Neutrophils Relative % 66 %   Neutro Abs 4.9 1.7 - 7.7 K/uL   Lymphocytes Relative 23 %   Lymphs Abs 1.8 0.7 - 4.0 K/uL   Monocytes Relative 10 %   Monocytes Absolute 0.8 0.1 - 1.0 K/uL   Eosinophils Relative 0 %   Eosinophils Absolute 0.0 0.0 - 0.5 K/uL   Basophils Relative 0 %   Basophils Absolute 0.0 0.0 - 0.1 K/uL   Immature Granulocytes 1 %   Abs Immature Granulocytes 0.07 0.00 - 0.07 K/uL    Comment: Performed at [facility], [address] [place], [place] [address]  Comprehensive metabolic panel     Status: Abnormal   Collection Time: 10/10/21  8:59 AM  Result Value Ref Range   Sodium 130 (L) 135 - 145 mmol/L   Potassium 3.8 3.5 - 5.1 mmol/L   Chloride 98 98 - 111 mmol/L   CO2 24 22 - 32 mmol/L   Glucose, Bld 328 (H) 70 - 99 mg/dL    Comment: Glucose reference range applies only to samples taken after fasting for at least 8 hours.   BUN 22 8 - 23 mg/dL   Creatinine, Ser 1.16 0.61 - 1.24 mg/dL   Calcium 9.5 8.9 - 10.3 mg/dL   Total Protein 7.5 6.5 - 8.1 g/dL   Albumin 3.9 3.5 - 5.0 g/dL   AST 18 15 - 41 U/L   ALT 44 0 - 44 U/L   Alkaline Phosphatase 82 38 - 126 U/L   Total Bilirubin 0.5 0.3 - 1.2 mg/dL   GFR, Estimated >60 >60 mL/min    Comment: (NOTE) Calculated using the CKD-EPI Creatinine Equation (2021)    Anion gap 8 5 - 15    Comment: Performed at [facility], [address] [place], [place] [address]  Protein Electro, Random Urine     Status: Abnormal   Collection Time: 10/10/21  8:59 AM  Result Value Ref Range   Total Protein, Urine 25.2 Not Estab. mg/dL   Albumin ELP, Urine 30.2 %   Alpha-1-Globulin, U 2.5 %   Alpha-2-Globulin, U 17.6 %   Beta Globulin, U 17.7 %   Gamma Globulin, U 31.9 %   M Component, Ur 9.4 (H) Not Observed %   Please Note: Comment     Comment: (NOTE) Protein electrophoresis scan will follow via computer, mail, or courier delivery. Performed At: [facility] [place], [place] [id number] Rush Farmer MD [id number]   Protein / creatinine ratio, urine     Status: Abnormal   Collection Time: 10/10/21  9:00 AM  Result Value Ref Range   Creatinine, Urine 116 mg/dL   Total Protein, Urine 31 mg/dL    Comment: NO NORMAL RANGE ESTABLISHED FOR THIS TEST   Protein Creatinine Ratio 0.27 (H) 0.00 - 0.15 mg/mg[Cre]    Comment: Performed at [facility], [address] [place],  [place] [address]  POCT HgB A1C      Status: Abnormal   Collection Time: 10/13/21  9:43 AM  Result Value Ref Range   Hemoglobin A1C 9.6 (A) 4.0 - 5.6 %   HbA1c POC (<> result, manual entry)     HbA1c, POC (prediabetic range)     HbA1c, POC (controlled diabetic range)    Protein / creatinine ratio, urine     Status: Abnormal   Collection Time: 10/31/21  1:51 PM  Result Value Ref Range   Creatinine, Urine 202 mg/dL   Total Protein, Urine 68 mg/dL    Comment: NO NORMAL RANGE ESTABLISHED FOR THIS TEST   Protein Creatinine Ratio 0.34 (H) 0.00 - 0.15 mg/mg[Cre]    Comment: Performed at [facility], [address] [place], [place] [address]  CBC with Differential     Status: Abnormal   Collection Time: 10/31/21  1:51 PM  Result Value Ref Range   WBC 7.1 4.0 - 10.5 K/uL   RBC 6.24 (H) 4.22 - 5.81 MIL/uL   Hemoglobin 17.7 (H) 13.0 - 17.0 g/dL   HCT 51.8 39.0 - 52.0 %   MCV 83.0 80.0 - 100.0 fL   MCH 28.4 26.0 - 34.0 pg   MCHC 34.2 30.0 - 36.0 g/dL   RDW 17.0 (H) 11.5 - 15.5 %   Platelets 193 150 - 400 K/uL   nRBC 0.0 0.0 - 0.2 %   Neutrophils Relative % 61 %   Neutro Abs 4.3 1.7 - 7.7 K/uL   Lymphocytes Relative 26 %   Lymphs Abs 1.8 0.7 - 4.0 K/uL   Monocytes Relative 11 %   Monocytes Absolute 0.8 0.1 - 1.0 K/uL   Eosinophils Relative 0 %   Eosinophils Absolute 0.0 0.0 - 0.5 K/uL   Basophils Relative 1 %   Basophils Absolute 0.0 0.0 - 0.1 K/uL   Immature Granulocytes 1 %   Abs Immature Granulocytes 0.10 (H) 0.00 - 0.07 K/uL    Comment: Performed at [facility], [address] [place], [place] [address]  Comprehensive metabolic panel     Status: Abnormal   Collection Time: 10/31/21  1:51 PM  Result Value Ref Range   Sodium 136 135 - 145 mmol/L   Potassium 3.6 3.5 - 5.1 mmol/L   Chloride 100 98 - 111 mmol/L   CO2 27 22 - 32 mmol/L   Glucose, Bld 197 (H) 70 - 99 mg/dL    Comment: Glucose reference range applies only to samples taken after fasting for at least 8 hours.   BUN 17 8 - 23 mg/dL    Creatinine, Ser 1.11 0.61 - 1.24 mg/dL   Calcium 9.8 8.9 - 10.3 mg/dL   Total Protein 8.3 (H) 6.5 - 8.1 g/dL   Albumin 3.8 3.5 - 5.0 g/dL   AST 26 15 - 41 U/L   ALT 59 (H) 0 - 44 U/L   Alkaline Phosphatase 92 38 - 126 U/L   Total Bilirubin 0.6 0.3 - 1.2 mg/dL   GFR, Estimated >60 >60 mL/min    Comment: (NOTE) Calculated using the CKD-EPI Creatinine Equation (2021)    Anion gap 9 5 - 15    Comment: Performed at [facility], [address] [place], [place] [address]     PHQ2/9: Depression screen [facility] 2/9 11/12/2021 10/13/2021 08/25/2021 06/11/2021 04/24/2021  Decreased Interest 0 0 0 0 0  Down, Depressed, Hopeless 0 0 0 0 0  PHQ - 2 Score 0 0 0  0 0  Altered sleeping 0 0 0 - -  Tired, decreased energy 0 0 0 - -  Change in appetite 0 0 0 - -  Feeling bad or failure about yourself  0 0 0 - -  Trouble concentrating 0 0 0 - -  Moving slowly or fidgety/restless 0 0 0 - -  Suicidal thoughts 0 0 0 - -  PHQ-9 Score 0 0 0 - -  Difficult doing work/chores - - - - -  Some recent data might be hidden    phq 9 is negative   Fall Risk: Fall Risk  11/12/2021 10/13/2021 08/25/2021 06/11/2021 04/24/2021  Falls in the past year? 0 0 0 0 1  Number falls in past yr: 0 0 0 0 1  Injury with Fall? 0 0 0 0 0  Risk for fall due to : No Fall Risks No Fall Risks No Fall Risks History of fall(s) History of fall(s)  Follow up Falls prevention discussed Falls prevention discussed Falls prevention discussed Falls prevention discussed Falls prevention discussed      Functional Status Survey: Is the patient deaf or have difficulty hearing?: No Does the patient have difficulty seeing, even when wearing glasses/contacts?: No Does the patient have difficulty concentrating, remembering, or making decisions?: Yes Does the patient have difficulty walking or climbing stairs?: Yes Does the patient have difficulty dressing or bathing?: No Does the patient have difficulty doing errands alone such as visiting a  doctor's office or shopping?: Yes    Assessment & Plan  1. Type 2 diabetes mellitus with hyperglycemia, with long-term current use of insulin (HCC)  Reminded importance of pre-meal insulin  Continue low carb diet when possible

## 2021-11-12 ENCOUNTER — Ambulatory Visit (INDEPENDENT_AMBULATORY_CARE_PROVIDER_SITE_OTHER): Payer: Medicare Other | Admitting: Family Medicine

## 2021-11-12 ENCOUNTER — Encounter: Payer: Self-pay | Admitting: Family Medicine

## 2021-11-12 VITALS — BP 120/82 | HR 86 | Resp 16 | Ht 64.0 in | Wt 154.0 lb

## 2021-11-12 DIAGNOSIS — E1165 Type 2 diabetes mellitus with hyperglycemia: Secondary | ICD-10-CM | POA: Diagnosis not present

## 2021-11-12 DIAGNOSIS — Z23 Encounter for immunization: Secondary | ICD-10-CM

## 2021-11-12 DIAGNOSIS — Z794 Long term (current) use of insulin: Secondary | ICD-10-CM

## 2021-11-13 ENCOUNTER — Other Ambulatory Visit: Payer: Self-pay | Admitting: Internal Medicine

## 2021-11-13 ENCOUNTER — Other Ambulatory Visit: Payer: Self-pay | Admitting: Family Medicine

## 2021-11-13 DIAGNOSIS — E7800 Pure hypercholesterolemia, unspecified: Secondary | ICD-10-CM

## 2021-11-13 DIAGNOSIS — I69354 Hemiplegia and hemiparesis following cerebral infarction affecting left non-dominant side: Secondary | ICD-10-CM

## 2021-11-13 DIAGNOSIS — E785 Hyperlipidemia, unspecified: Secondary | ICD-10-CM

## 2021-11-13 DIAGNOSIS — I700 Atherosclerosis of aorta: Secondary | ICD-10-CM

## 2021-11-13 NOTE — Telephone Encounter (Signed)
Requested Prescriptions  ?Pending Prescriptions Disp Refills  ?? atorvastatin (LIPITOR) 40 MG tablet [Pharmacy Med Name: Atorvastatin Calcium 40 MG Oral Tablet] 90 tablet 0  ?  Sig: Take 1 tablet by mouth once daily  ?  ? Cardiovascular:  Antilipid - Statins Failed - 11/13/2021  4:50 PM  ?  ?  Failed - Lipid Panel in normal range within the last 12 months  ?  Cholesterol, Total  ?Date Value Ref Range Status  ?10/01/2015 155 100 - 199 mg/dL Final  ? ?Cholesterol  ?Date Value Ref Range Status  ?03/12/2021 117 0 - 200 mg/dL Final  ? ?LDL Cholesterol (Calc)  ?Date Value Ref Range Status  ?07/26/2020 81 mg/dL (calc) Final  ?  Comment:  ?  Reference range: <100 ?Marland Kitchen ?Desirable range <100 mg/dL for primary prevention;   ?<70 mg/dL for patients with CHD or diabetic patients  ?with > or = 2 CHD risk factors. ?. ?LDL-C is now calculated using the Martin-Hopkins  ?calculation, which is a validated novel method providing  ?better accuracy than the Friedewald equation in the  ?estimation of LDL-C.  ?Cresenciano Genre et al. Annamaria Helling. 1017;510(25): 2061-2068  ?(http://education.[employer].com/faq/FAQ164) ?  ? ?LDL Cholesterol  ?Date Value Ref Range Status  ?03/12/2021 62 0 - 99 mg/dL Final  ?  Comment:  ?         ?Total Cholesterol/HDL:CHD Risk ?Coronary Heart Disease Risk Table ?                    Men   Women ? 1/2 Average Risk   3.4   3.3 ? Average Risk       5.0   4.4 ? 2 X Average Risk   9.6   7.1 ? 3 X Average Risk  23.4   11.0 ?       ?Use the calculated Patient Ratio ?above and the CHD Risk Table ?to determine the patient's CHD Risk. ?       ?ATP III CLASSIFICATION (LDL): ? <100     mg/dL   Optimal ? 100-129  mg/dL   Near or Above ?                   Optimal ? 130-159  mg/dL   Borderline ? 160-189  mg/dL   High ? >190     mg/dL   Very High ?Performed at [facility], [address], [place], [place] [address] ?  ? ?HDL  ?Date Value Ref Range Status  ?03/12/2021 41 >40 mg/dL Final  ?10/01/2015 51 >39 mg/dL Final   ? ?Triglycerides  ?Date Value Ref Range Status  ?03/12/2021 71 <150 mg/dL Final  ? ?  ?  ?  Passed - Patient is not pregnant  ?  ?  Passed - Valid encounter within last 12 months  ?  Recent Outpatient Visits   ?      ? Yesterday Type 2 diabetes mellitus with hyperglycemia, with long-term current use of insulin ([place])  ? [facility] [place], Drue Stager, MD  ? 1 month ago Type 2 diabetes mellitus with hyperglycemia, with long-term current use of insulin ([place])  ? [facility] [name], MD  ? 2 months ago Type 2 diabetes mellitus with hyperglycemia, with long-term current use of insulin ([place])  ? [facility] [place], Drue Stager, MD  ? 5 months ago Hyperlipidemia associated with type 2 diabetes mellitus ([place])  ? [facility] [name], MD  ?  9 months ago Hyperlipidemia associated with type 2 diabetes mellitus ([place])  ? [facility] [name], MD  ?  ?  ?Future Appointments   ?        ? In 3 months [name], MD [facility], [place]  ? In 5 months  [place]  ?  ? ?  ?  ?  ? ? ?

## 2021-11-14 ENCOUNTER — Encounter: Payer: Self-pay | Admitting: Internal Medicine

## 2021-12-05 ENCOUNTER — Inpatient Hospital Stay: Payer: Medicare Other | Attending: Internal Medicine

## 2021-12-05 ENCOUNTER — Inpatient Hospital Stay (HOSPITAL_BASED_OUTPATIENT_CLINIC_OR_DEPARTMENT_OTHER): Payer: Medicare Other | Admitting: Nurse Practitioner

## 2021-12-05 VITALS — BP 128/85 | HR 85 | Temp 96.1°F | Resp 16 | Ht 65.0 in | Wt 153.4 lb

## 2021-12-05 DIAGNOSIS — C3412 Malignant neoplasm of upper lobe, left bronchus or lung: Secondary | ICD-10-CM | POA: Diagnosis not present

## 2021-12-05 DIAGNOSIS — C7931 Secondary malignant neoplasm of brain: Secondary | ICD-10-CM

## 2021-12-05 LAB — COMPREHENSIVE METABOLIC PANEL
ALT: 83 U/L — ABNORMAL HIGH (ref 0–44)
AST: 34 U/L (ref 15–41)
Albumin: 4 g/dL (ref 3.5–5.0)
Alkaline Phosphatase: 83 U/L (ref 38–126)
Anion gap: 8 (ref 5–15)
BUN: 17 mg/dL (ref 8–23)
CO2: 25 mmol/L (ref 22–32)
Calcium: 9.5 mg/dL (ref 8.9–10.3)
Chloride: 101 mmol/L (ref 98–111)
Creatinine, Ser: 1.06 mg/dL (ref 0.61–1.24)
GFR, Estimated: >60 mL/min (ref >60)
Glucose, Bld: 172 mg/dL — ABNORMAL HIGH (ref 70–99)
Potassium: 4 mmol/L (ref 3.5–5.1)
Sodium: 134 mmol/L — ABNORMAL LOW (ref 135–145)
Total Bilirubin: 0.8 mg/dL (ref 0.3–1.2)
Total Protein: 7.2 g/dL (ref 6.5–8.1)

## 2021-12-05 LAB — CBC WITH DIFFERENTIAL/PLATELET
Abs Immature Granulocytes: 0.02 10*3/uL (ref 0.00–0.07)
Basophils Absolute: 0 10*3/uL (ref 0.0–0.1)
Basophils Relative: 1 %
Eosinophils Absolute: 0.1 10*3/uL (ref 0.0–0.5)
Eosinophils Relative: 1 %
HCT: 52.6 % — ABNORMAL HIGH (ref 39.0–52.0)
Hemoglobin: 17.8 g/dL — ABNORMAL HIGH (ref 13.0–17.0)
Immature Granulocytes: 0 %
Lymphocytes Relative: 21 %
Lymphs Abs: 1.7 10*3/uL (ref 0.7–4.0)
MCH: 29.3 pg (ref 26.0–34.0)
MCHC: 33.8 g/dL (ref 30.0–36.0)
MCV: 86.7 fL (ref 80.0–100.0)
Monocytes Absolute: 0.7 10*3/uL (ref 0.1–1.0)
Monocytes Relative: 9 %
Neutro Abs: 5.5 10*3/uL (ref 1.7–7.7)
Neutrophils Relative %: 68 %
Platelets: 184 10*3/uL (ref 150–400)
RBC: 6.07 MIL/uL — ABNORMAL HIGH (ref 4.22–5.81)
RDW: 15.6 % — ABNORMAL HIGH (ref 11.5–15.5)
WBC: 8 10*3/uL (ref 4.0–10.5)
nRBC: 0 % (ref 0.0–0.2)

## 2021-12-05 LAB — PROTEIN / CREATININE RATIO, URINE
Creatinine, Urine: 169 mg/dL
Protein Creatinine Ratio: 0.2 mg/mg{Cre} — ABNORMAL HIGH (ref 0.00–0.15)
Total Protein, Urine: 34 mg/dL

## 2021-12-05 NOTE — Progress Notes (Signed)
Appointment inadvertently scheduled. Requested patient have ct chest and be rescheduled to next week for results.  ? ?

## 2021-12-17 ENCOUNTER — Ambulatory Visit
Admission: RE | Admit: 2021-12-17 | Discharge: 2021-12-17 | Disposition: A | Discharge status: Home / Self Care | Payer: Medicare Other | Source: Ambulatory Visit | Attending: Internal Medicine | Admitting: Internal Medicine

## 2021-12-17 DIAGNOSIS — C3490 Malignant neoplasm of unspecified part of unspecified bronchus or lung: Secondary | ICD-10-CM | POA: Diagnosis not present

## 2021-12-17 DIAGNOSIS — I700 Atherosclerosis of aorta: Secondary | ICD-10-CM | POA: Diagnosis not present

## 2021-12-17 DIAGNOSIS — C3412 Malignant neoplasm of upper lobe, left bronchus or lung: Secondary | ICD-10-CM | POA: Diagnosis not present

## 2021-12-17 DIAGNOSIS — R911 Solitary pulmonary nodule: Secondary | ICD-10-CM | POA: Diagnosis not present

## 2021-12-20 ENCOUNTER — Other Ambulatory Visit: Payer: Self-pay | Admitting: Internal Medicine

## 2021-12-22 ENCOUNTER — Inpatient Hospital Stay: Payer: Medicare Other | Attending: Internal Medicine

## 2021-12-22 ENCOUNTER — Encounter: Payer: Self-pay | Admitting: Internal Medicine

## 2021-12-22 ENCOUNTER — Other Ambulatory Visit: Payer: Self-pay

## 2021-12-22 ENCOUNTER — Inpatient Hospital Stay (HOSPITAL_BASED_OUTPATIENT_CLINIC_OR_DEPARTMENT_OTHER): Payer: Medicare Other | Admitting: Internal Medicine

## 2021-12-22 VITALS — BP 150/86 | HR 52 | Temp 97.4°F | Resp 16 | Wt 154.6 lb

## 2021-12-22 DIAGNOSIS — C7931 Secondary malignant neoplasm of brain: Secondary | ICD-10-CM | POA: Diagnosis not present

## 2021-12-22 DIAGNOSIS — Z86718 Personal history of other venous thrombosis and embolism: Secondary | ICD-10-CM | POA: Diagnosis not present

## 2021-12-22 DIAGNOSIS — C3412 Malignant neoplasm of upper lobe, left bronchus or lung: Secondary | ICD-10-CM

## 2021-12-22 DIAGNOSIS — Z86711 Personal history of pulmonary embolism: Secondary | ICD-10-CM | POA: Insufficient documentation

## 2021-12-22 DIAGNOSIS — H538 Other visual disturbances: Secondary | ICD-10-CM | POA: Insufficient documentation

## 2021-12-22 DIAGNOSIS — Z7901 Long term (current) use of anticoagulants: Secondary | ICD-10-CM | POA: Diagnosis not present

## 2021-12-22 DIAGNOSIS — R030 Elevated blood-pressure reading, without diagnosis of hypertension: Secondary | ICD-10-CM | POA: Diagnosis not present

## 2021-12-22 DIAGNOSIS — Z923 Personal history of irradiation: Secondary | ICD-10-CM | POA: Insufficient documentation

## 2021-12-22 DIAGNOSIS — G629 Polyneuropathy, unspecified: Secondary | ICD-10-CM | POA: Insufficient documentation

## 2021-12-22 DIAGNOSIS — N183 Chronic kidney disease, stage 3 unspecified: Secondary | ICD-10-CM | POA: Diagnosis not present

## 2021-12-22 DIAGNOSIS — E1165 Type 2 diabetes mellitus with hyperglycemia: Secondary | ICD-10-CM | POA: Insufficient documentation

## 2021-12-22 DIAGNOSIS — Z79899 Other long term (current) drug therapy: Secondary | ICD-10-CM | POA: Diagnosis not present

## 2021-12-22 LAB — COMPREHENSIVE METABOLIC PANEL
ALT: 55 U/L — ABNORMAL HIGH (ref 0–44)
AST: 23 U/L (ref 15–41)
Albumin: 3.6 g/dL (ref 3.5–5.0)
Alkaline Phosphatase: 79 U/L (ref 38–126)
Anion gap: 6 (ref 5–15)
BUN: 16 mg/dL (ref 8–23)
CO2: 24 mmol/L (ref 22–32)
Calcium: 9 mg/dL (ref 8.9–10.3)
Chloride: 104 mmol/L (ref 98–111)
Creatinine, Ser: 1.35 mg/dL — ABNORMAL HIGH (ref 0.61–1.24)
GFR, Estimated: 57 mL/min — ABNORMAL LOW (ref >60)
Glucose, Bld: 270 mg/dL — ABNORMAL HIGH (ref 70–99)
Potassium: 3.7 mmol/L (ref 3.5–5.1)
Sodium: 134 mmol/L — ABNORMAL LOW (ref 135–145)
Total Bilirubin: 1 mg/dL (ref 0.3–1.2)
Total Protein: 7 g/dL (ref 6.5–8.1)

## 2021-12-22 LAB — CBC WITH DIFFERENTIAL/PLATELET
Abs Immature Granulocytes: 0.03 10*3/uL (ref 0.00–0.07)
Basophils Absolute: 0 10*3/uL (ref 0.0–0.1)
Basophils Relative: 0 %
Eosinophils Absolute: 0.1 10*3/uL (ref 0.0–0.5)
Eosinophils Relative: 1 %
HCT: 49.6 % (ref 39.0–52.0)
Hemoglobin: 17 g/dL (ref 13.0–17.0)
Immature Granulocytes: 1 %
Lymphocytes Relative: 30 %
Lymphs Abs: 1.7 10*3/uL (ref 0.7–4.0)
MCH: 29.7 pg (ref 26.0–34.0)
MCHC: 34.3 g/dL (ref 30.0–36.0)
MCV: 86.7 fL (ref 80.0–100.0)
Monocytes Absolute: 0.6 10*3/uL (ref 0.1–1.0)
Monocytes Relative: 11 %
Neutro Abs: 3.2 10*3/uL (ref 1.7–7.7)
Neutrophils Relative %: 57 %
Platelets: 177 10*3/uL (ref 150–400)
RBC: 5.72 MIL/uL (ref 4.22–5.81)
RDW: 14.4 % (ref 11.5–15.5)
WBC: 5.6 10*3/uL (ref 4.0–10.5)
nRBC: 0 % (ref 0.0–0.2)

## 2021-12-22 MED ORDER — LEVETIRACETAM 500 MG PO TABS: 500.0000 mg | ORAL_TABLET | Freq: Two times a day (BID) | ORAL | 3 refills | Status: DC

## 2021-12-22 NOTE — Progress Notes (Signed)
I connected with [name] on 12/22/21 at  1:15 PM EDT by video enabled telemedicine visit and verified that I am speaking with the correct person using two identifiers.  ?I discussed the limitations, risks, security and privacy concerns of performing an evaluation and management service by telemedicine and the availability of in-person appointments. I also discussed with the patient that there may be a patient responsible charge related to this service. The patient expressed understanding and agreed to proceed.  ? ? ?Other persons participating in the visit and their role in the encounter: RN/medical reconciliation ?Patient?s location: office ?Provider?s location: home ? ?Oncology History Overview Note  ?# OCT 2017- ADENO CA LUNG; STAGE IV [; LUL; bil supraclavicular LN; Left neck LN Bx]; ROS-1 MUTATED; s/p Carbo-alimta x1[oct 2017] ? ?# NOV 1st 2017- XALKORI 250 mg BID; JAN 15th CT- PR; ? ?# AUG 27th Chemo-RT to persistent LUL/mediastinal LN [s/p carbo-taxol- with RT; finished Sep 22nd 2018];  ? ?# July 14th 2020- multiple metastatic lesions of the brain [July 30 whole brain radiation finished April 21, 2019]; bone scan skull metastases /posterior left ninth rib metastases; April 04, 2019 stopped crizotinib; ? ?#April 25, 2019-start Entrectinib 600 mg once a day; STopped in NOV 2020 [dizziness]; NOV 2020-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 ? ?# DEC 1st 2020- start avastin q 2w; x6; MRI October 30, 2019-significantly improved left temporal lesion subcentimeter for stable lesions.;  [Likely radiation necrosis] Stop Avastin ? ?#March 2 week 2021-restart Rozyltrek; STOP MARCH 2022- progression in Brain; s/p WBRT [finished March 28th, 2022] ? ?# April 8th 2022- START LORLATINIB 100 mg.day; April 23rd 2022-HELD sec to hospital/declining PS; May 23rd-started Lorlatinib [inspite of recs to HOLD]; June 7th, 2022- [facility]] ? ?# Mid-April 2022- bilateral peroneal DVT; UTI; [ Hx  hemorrhagic brain metastases] s/p IVC filter May 24 inpatient MRI brain showed-improved/stable brain mets. ? ? ?#June 2022-stopped lorlatinib [severe delirium/aggression]; currently on surveillance. ? ?# DEC 2022-MRI brain- ?  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  MRI February 6, 23-significant response ? ?In the interim patient also underwent-HOLEP on May 6.  ? ?# LLE DVT/bil PE/Multiple strokes [? On xarelto]-Lovenox; Jan mid 2018- xarelto [lovenox-insurance issues]; [facility] MARCH 2022- hemorraghic metasssaes ? ? ? ?# MRI brain- multiple infarcts [2d echo/bubble study-NEG's/p Neurology eval] ? ?------------------------------------------------------------   ? ?# # s/p TURP [Sep 2017; Dr.Cope] DEC 26th CT- distended bladder;  ?# Elevated PSA-  FEB 2021- 12;  S/p Bx- Negative; follow with urology/Dr.Stoioff ? ? ?# MOLECULAR TESTING- ROS-1 POSITIVE; ALK/EGFR-NEG; PDL-1 EXPRESSION- 90%** [HIGH] ? ?# DEC 15th 2020- PALLIATIVE CARE ?-----------------------------------------------------------------   ? ?DIAGNOSIS: Adenocarcinoma the lung  ROS-1 + ? ?STAGE:  IV    ;GOALS: Palliative ? ?CURRENT/MOST RECENT THERAPY- Avastin [C] ?  ?Primary cancer of left upper lobe of lung ([place])  ?08/09/2019 -  Chemotherapy  ? Patient is on Treatment Plan : BRAIN GBM Bevacizumab 14d x 6 cycles  ? ?  ?  ? ?Chief Complaint: Lung cancer ? ?History of present illness:Melvin Willis 71 y.o.  male with history of stage IV lung cancer/brain mets is here for follow-up. ? ?Patient denies any new headache.  Patient denies any nausea vomiting.  No swelling in the legs. ? ?Complains of intermittent blurry vision.  Patient continues with dexamethasone half a milligram every day.  Blood sugars are poorly controlled. ? ?Observation/objective: Alert & oriented x 3. In No acute distress.  ? ?  Assessment and plan: ?Primary cancer of left upper lobe of lung ([place]) ?#Brain metastases - MAY  2022-s/p WBRT- MRI brain DEC 2022-likely. Radiation necrosis- currently s/p  Avastin #4 [finished FEB 2023] FEB 6th, 2023-decreased size of the left frontal left temporal and the ventral midbrain metastases.  No new lesions noted.  We will plan ordering MRI of the brain in 1 month/May 2023. ? 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 Possibly reactive, although metastatic disease cannot be excluded.  Would recommend a PET scan in approximately 2 months; will order at next visit. ? ?# Elevated BG- PBF- 270- on dex 0.5 mg once a day; taper off dex.- take dex 0.5 mg every other day x2 weeks and then stop ?  ? # Elevated BP- ? Avastin;[140/09s] ; continue  amlodipine 5 mg day.  ? ?# Bilateral PE & left lower extremity DVT:s/p IVC filter; currently on eliquis 2.5 g BID [5/13]-STABLE ? ?# CKD stage III-creatinine 1.5 [GFR~51]- STABLE;  ? ?# Peripheral Neuropathy- STABLE;  DM;continue  gabapentin 200 mg qhs-STABLE;  ? ?# IV access: PIV ? ?# DISPOSITION:   ?# follow up in  1 month --MD; labs cbc/cmp;urine protein creatinine ratio; NO treatment; MRI Brain--Dr.B ? ? ?Cc; Dr.Sowles.  ? ? ? ?Follow-up instructions: ? ?I discussed the assessment and treatment plan with the patient.  The patient was provided an opportunity to ask questions and all were answered.  The patient agreed with the plan and demonstrated understanding of instructions. ? ?The patient was advised to call back or seek an in person evaluation if the symptoms worsen or if the condition fails to improve as anticipated. ? ? ? ?Dr. Charlaine Dalton ?[facility] ?12/22/2021 ?1:40 PM ?

## 2021-12-22 NOTE — Progress Notes (Signed)
Patient denies new problems/concerns today.   °

## 2021-12-22 NOTE — Assessment & Plan Note (Addendum)
#  Brain metastases - MAY 2022-s/p WBRT- MRI brain DEC 2022-likely. Radiation necrosis- currently s/p  Avastin #4 [finished FEB 2023] FEB 6th, 2023-decreased size of the left frontal left temporal and the ventral midbrain metastases.  No new lesions noted. ?We will plan ordering MRI of the brain in 1 month/May 2023. ? 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 Possibly reactive, although metastatic disease cannot be excluded.  Would recommend a PET scan in approximately 2 months; will order at next visit. ? ?# Elevated BG- PBF- 270- on dex 0.5 mg once a day; taper off dex.- take dex 0.5 mg every other day x2 weeks and then stop ?? ??# Elevated BP- ? Avastin;[140/09s] ; continue  amlodipine 5 mg day.  ? ?# Bilateral PE & left lower extremity DVT:s/p IVC filter; currently on eliquis 2.5 g BID [5/13]-STABLE ? ?# CKD stage III-creatinine 1.5 [GFR~51]- STABLE;  ? ?# Peripheral Neuropathy- STABLE;  DM;continue  gabapentin 200 mg qhs-STABLE;  ? ?# IV access: PIV ? ?# DISPOSITION:   ?# follow up in  1 month --MD; labs cbc/cmp;urine protein creatinine ratio; NO treatment; MRI Brain--Dr.B ? ? ?Cc; Dr.Sowles.  ?

## 2021-12-22 NOTE — Patient Instructions (Signed)
Take dex [half a pill] 0.5 mg every other day x2 weeks and then stop ?

## 2022-01-12 ENCOUNTER — Ambulatory Visit
Admission: RE | Admit: 2022-01-12 | Discharge: 2022-01-12 | Disposition: A | Discharge status: Home / Self Care | Payer: Medicare Other | Source: Ambulatory Visit | Attending: Internal Medicine | Admitting: Internal Medicine

## 2022-01-12 DIAGNOSIS — G9389 Other specified disorders of brain: Secondary | ICD-10-CM | POA: Diagnosis not present

## 2022-01-12 DIAGNOSIS — C3412 Malignant neoplasm of upper lobe, left bronchus or lung: Secondary | ICD-10-CM | POA: Insufficient documentation

## 2022-01-12 DIAGNOSIS — C7931 Secondary malignant neoplasm of brain: Secondary | ICD-10-CM | POA: Insufficient documentation

## 2022-01-12 MED ORDER — GADOBUTROL 1 MMOL/ML IV SOLN
7.0000 mL | Freq: Once | INTRAVENOUS | Status: AC | PRN
  Administered 2022-01-12: 7 mL via INTRAVENOUS

## 2022-01-20 ENCOUNTER — Inpatient Hospital Stay: Payer: Medicare Other | Attending: Internal Medicine

## 2022-01-20 ENCOUNTER — Encounter: Payer: Self-pay | Admitting: Internal Medicine

## 2022-01-20 ENCOUNTER — Inpatient Hospital Stay (HOSPITAL_BASED_OUTPATIENT_CLINIC_OR_DEPARTMENT_OTHER): Payer: Medicare Other | Admitting: Internal Medicine

## 2022-01-20 DIAGNOSIS — Z79899 Other long term (current) drug therapy: Secondary | ICD-10-CM | POA: Insufficient documentation

## 2022-01-20 DIAGNOSIS — R413 Other amnesia: Secondary | ICD-10-CM | POA: Insufficient documentation

## 2022-01-20 DIAGNOSIS — C3412 Malignant neoplasm of upper lobe, left bronchus or lung: Secondary | ICD-10-CM | POA: Diagnosis not present

## 2022-01-20 DIAGNOSIS — C7931 Secondary malignant neoplasm of brain: Secondary | ICD-10-CM | POA: Diagnosis not present

## 2022-01-20 DIAGNOSIS — Z818 Family history of other mental and behavioral disorders: Secondary | ICD-10-CM | POA: Diagnosis not present

## 2022-01-20 DIAGNOSIS — Z7901 Long term (current) use of anticoagulants: Secondary | ICD-10-CM | POA: Insufficient documentation

## 2022-01-20 DIAGNOSIS — Z923 Personal history of irradiation: Secondary | ICD-10-CM | POA: Insufficient documentation

## 2022-01-20 DIAGNOSIS — Z8673 Personal history of transient ischemic attack (TIA), and cerebral infarction without residual deficits: Secondary | ICD-10-CM | POA: Diagnosis not present

## 2022-01-20 DIAGNOSIS — C7951 Secondary malignant neoplasm of bone: Secondary | ICD-10-CM | POA: Insufficient documentation

## 2022-01-20 DIAGNOSIS — N183 Chronic kidney disease, stage 3 unspecified: Secondary | ICD-10-CM | POA: Diagnosis not present

## 2022-01-20 DIAGNOSIS — Z8249 Family history of ischemic heart disease and other diseases of the circulatory system: Secondary | ICD-10-CM | POA: Diagnosis not present

## 2022-01-20 DIAGNOSIS — Z86718 Personal history of other venous thrombosis and embolism: Secondary | ICD-10-CM | POA: Insufficient documentation

## 2022-01-20 DIAGNOSIS — Z8042 Family history of malignant neoplasm of prostate: Secondary | ICD-10-CM | POA: Insufficient documentation

## 2022-01-20 DIAGNOSIS — R5383 Other fatigue: Secondary | ICD-10-CM | POA: Insufficient documentation

## 2022-01-20 DIAGNOSIS — M2550 Pain in unspecified joint: Secondary | ICD-10-CM | POA: Insufficient documentation

## 2022-01-20 DIAGNOSIS — Z833 Family history of diabetes mellitus: Secondary | ICD-10-CM | POA: Diagnosis not present

## 2022-01-20 DIAGNOSIS — R972 Elevated prostate specific antigen [PSA]: Secondary | ICD-10-CM | POA: Diagnosis not present

## 2022-01-20 DIAGNOSIS — Z86711 Personal history of pulmonary embolism: Secondary | ICD-10-CM | POA: Insufficient documentation

## 2022-01-20 DIAGNOSIS — E1122 Type 2 diabetes mellitus with diabetic chronic kidney disease: Secondary | ICD-10-CM | POA: Insufficient documentation

## 2022-01-20 DIAGNOSIS — Z8719 Personal history of other diseases of the digestive system: Secondary | ICD-10-CM | POA: Insufficient documentation

## 2022-01-20 LAB — CBC WITH DIFFERENTIAL/PLATELET
Abs Immature Granulocytes: 0.02 10*3/uL (ref 0.00–0.07)
Basophils Absolute: 0 10*3/uL (ref 0.0–0.1)
Basophils Relative: 1 %
Eosinophils Absolute: 0.1 10*3/uL (ref 0.0–0.5)
Eosinophils Relative: 2 %
HCT: 50.1 % (ref 39.0–52.0)
Hemoglobin: 17.1 g/dL — ABNORMAL HIGH (ref 13.0–17.0)
Immature Granulocytes: 0 %
Lymphocytes Relative: 41 %
Lymphs Abs: 2.2 10*3/uL (ref 0.7–4.0)
MCH: 29.9 pg (ref 26.0–34.0)
MCHC: 34.1 g/dL (ref 30.0–36.0)
MCV: 87.7 fL (ref 80.0–100.0)
Monocytes Absolute: 0.5 10*3/uL (ref 0.1–1.0)
Monocytes Relative: 9 %
Neutro Abs: 2.5 10*3/uL (ref 1.7–7.7)
Neutrophils Relative %: 47 %
Platelets: 239 10*3/uL (ref 150–400)
RBC: 5.71 MIL/uL (ref 4.22–5.81)
RDW: 13.1 % (ref 11.5–15.5)
WBC: 5.3 10*3/uL (ref 4.0–10.5)
nRBC: 0 % (ref 0.0–0.2)

## 2022-01-20 LAB — COMPREHENSIVE METABOLIC PANEL
ALT: 32 U/L (ref 0–44)
AST: 23 U/L (ref 15–41)
Albumin: 4.1 g/dL (ref 3.5–5.0)
Alkaline Phosphatase: 88 U/L (ref 38–126)
Anion gap: 10 (ref 5–15)
BUN: 11 mg/dL (ref 8–23)
CO2: 22 mmol/L (ref 22–32)
Calcium: 9.2 mg/dL (ref 8.9–10.3)
Chloride: 104 mmol/L (ref 98–111)
Creatinine, Ser: 1.18 mg/dL (ref 0.61–1.24)
GFR, Estimated: >60 mL/min (ref >60)
Glucose, Bld: 146 mg/dL — ABNORMAL HIGH (ref 70–99)
Potassium: 3.8 mmol/L (ref 3.5–5.1)
Sodium: 136 mmol/L (ref 135–145)
Total Bilirubin: 0.9 mg/dL (ref 0.3–1.2)
Total Protein: 7.8 g/dL (ref 6.5–8.1)

## 2022-01-20 NOTE — Progress Notes (Signed)
[facility] ?OFFICE PROGRESS NOTE ? ?Patient Care Team: ?[name], MD as PCP - General (Family Medicine) ?Cammie Sickle, MD as Medical Oncologist (Medical Oncology) ?Samara Deist, DPM as Consulting Physician (Podiatry) ?Germaine Pomfret, [facility] (Pharmacist) ?Ventura Sellers, MD as Consulting Physician (Psychiatry) ?Billey Co, MD as Consulting Physician (Urology) ? ? Cancer Staging  ?No matching staging information was found for the patient. ? ? ?Oncology History Overview Note  ?# OCT 2017- ADENO CA LUNG; STAGE IV [; LUL; bil supraclavicular LN; Left neck LN Bx]; ROS-1 MUTATED; s/p Carbo-alimta x1[oct 2017] ? ?# NOV 1st 2017- XALKORI 250 mg BID; JAN 15th CT- PR; ? ?# AUG 27th Chemo-RT to persistent LUL/mediastinal LN [s/p carbo-taxol- with RT; finished Sep 22nd 2018];  ? ?# July 14th 2020- multiple metastatic lesions of the brain [July 30 whole brain radiation finished April 21, 2019]; bone scan skull metastases /posterior left ninth rib metastases; April 04, 2019 stopped crizotinib; ? ?#April 25, 2019-start Entrectinib 600 mg once a day; STopped in NOV 2020 [dizziness]; NOV 2020-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 ? ?# DEC 1st 2020- start avastin q 2w; x6; MRI October 30, 2019-significantly improved left temporal lesion subcentimeter for stable lesions.;  [Likely radiation necrosis] Stop Avastin ? ?#March 2 week 2021-restart Rozyltrek; STOP MARCH 2022- progression in Brain; s/p WBRT [finished March 28th, 2022] ? ?# April 8th 2022- START LORLATINIB 100 mg.day; April 23rd 2022-HELD sec to hospital/declining PS; May 23rd-started Lorlatinib [inspite of recs to HOLD]; June 7th, 2022- Held again [facility]] ? ?# Mid-April 2022- bilateral peroneal DVT; UTI; [ Hx hemorrhagic brain metastases] s/p IVC filter May 24 inpatient MRI brain showed-improved/stable brain mets. ? ? ?#June 2022-stopped lorlatinib [severe delirium/aggression]; currently on surveillance. ? ?# DEC  2022-MRI brain- ?  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  MRI February 6, 23-significant response ? ?In the interim patient also underwent-HOLEP on May 6.  ? ?# LLE DVT/bil PE/Multiple strokes [? On xarelto]-Lovenox; Jan mid 2018- xarelto [lovenox-insurance issues]; [facility] MARCH 2022- hemorraghic metasssaes ? ? ? ?# MRI brain- multiple infarcts [2d echo/bubble study-NEG's/p Neurology eval] ? ?------------------------------------------------------------   ? ?# # s/p TURP [Sep 2017; Dr.Cope] DEC 26th CT- distended bladder;  ?# Elevated PSA-  FEB 2021- 12;  S/p Bx- Negative; follow with urology/Dr.Stoioff ? ? ?# MOLECULAR TESTING- ROS-1 POSITIVE; ALK/EGFR-NEG; PDL-1 EXPRESSION- 90%** [HIGH] ? ?# DEC 15th 2020- PALLIATIVE CARE ?-----------------------------------------------------------------   ? ?DIAGNOSIS: Adenocarcinoma the lung  ROS-1 + ? ?STAGE:  IV    ;GOALS: Palliative ? ?CURRENT/MOST RECENT THERAPY- Avastin [C] ?  ?Primary cancer of left upper lobe of lung ([place])  ?08/09/2019 -  Chemotherapy  ? Patient is on Treatment Plan : BRAIN GBM Bevacizumab 14d x 6 cycles  ? ?   ? ?  ? ?INTERVAL HISTORY: Ambulating independently.  Accompanied by his wife. ? ?Melvin Willis 71 y.o.  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 radiation necrosis] currently s/p Avastin [Feb 2023] is here for follow-up to review the results of MRI brain.  ? ?Patient denies any further episodes of confusion.  However wife endorses to intermittent confusion.  Patient denies any headaches.  No nausea no vomiting.  Patient is currently off dexamethasone.  Blood sugars better controlled.  No leg swelling but weight gain.  No falls.He continues with Eliquis. ? ?Review of Systems  ?Constitutional:  Positive for malaise/fatigue. Negative for  chills, diaphoresis, fever and weight loss.  ?HENT:  Negative  for nosebleeds and sore throat.   ?Eyes:  Negative for double vision.  ?Respiratory:  Negative for cough, hemoptysis, sputum production, shortness of breath and wheezing.   ?Cardiovascular:  Negative for chest pain (Left upper chest wall pain chronic), palpitations and orthopnea.  ?Gastrointestinal:  Negative for abdominal pain, blood in stool, constipation, diarrhea, heartburn and melena.  ?Genitourinary:  Negative for dysuria.  ?Musculoskeletal:  Positive for joint pain. Negative for back pain.  ?Skin: Negative.  Negative for itching and rash.  ?Neurological:  Positive for tingling. Negative for focal weakness and weakness.  ?Endo/Heme/Allergies:  Does not bruise/bleed easily.  ?Psychiatric/Behavioral:  Positive for memory loss. Negative for depression. The patient is not nervous/anxious and does not have insomnia.   ? ?PAST MEDICAL HISTORY :  ?Past Medical History:  ?Diagnosis Date  ? Abnormal prostate specific antigen 08/08/2012  ? Adiposity 04/16/2015  ? Chronic kidney disease (CKD), stage III (moderate) ([place]) 11/27/2016  ? CVA (cerebral vascular accident) ([place]) 06/17/2016  ? Diabetes mellitus without complication ([place])   ? Diverticulosis of sigmoid colon 04/16/2015  ? Dyslipidemia 03/18/2015  ? Hemorrhoids, internal 04/16/2015  ? Hypercholesteremia 04/16/2015  ? Hyperlipidemia   ? Hypertension   ? Primary cancer of left upper lobe of lung ([place])   ? Pulmonary embolism ([place])   ? Wears dentures   ? partial upper  ? ? ?PAST SURGICAL HISTORY :   ?Past Surgical History:  ?Procedure Laterality Date  ? COLONOSCOPY    ? COLONOSCOPY WITH PROPOFOL N/A 05/06/2015  ? Procedure: COLONOSCOPY WITH PROPOFOL;  Surgeon: Lucilla Lame, MD;  Location: [facility];  Service: Endoscopy;  Laterality: N/A;  ASCENDING COLON POLYPS X 2 ?TERMINAL ILEUM BIOPSY ?RANDOM COLON BX. ?TRANSVERSE COLON POLYP ?SIGMOID COLON POLYP  ? ESOPHAGOGASTRODUODENOSCOPY (EGD) WITH PROPOFOL N/A 05/06/2015  ? Procedure: ESOPHAGOGASTRODUODENOSCOPY (EGD) WITH  PROPOFOL;  Surgeon: Lucilla Lame, MD;  Location: [facility];  Service: Endoscopy;  Laterality: N/A;  GASTRIC BIOPSY X1  ? HOLEP-LASER ENUCLEATION OF THE PROSTATE WITH MORCELLATION N/A 01/10/2021  ? Procedure: HOLEP-LASER ENUCLEATION OF THE PROSTATE WITH MORCELLATION;  Surgeon: Billey Co, MD;  Location: [facility];  Service: Urology;  Laterality: N/A;  ? IVC FILTER INSERTION N/A 12/29/2020  ? Procedure: IVC FILTER INSERTION;  Surgeon: Dwana Curd, MD;  Location: [facility];  Service: Cardiovascular;  Laterality: N/A;  ? KIDNEY STONE SURGERY  2017  ? ? ?FAMILY HISTORY :   ?Family History  ?Problem Relation Age of Onset  ? Diabetes Mother   ? Diabetes Father   ? CAD Father   ? Dementia Father   ? Diabetes Sister   ? Cancer Maternal Uncle   ?     Prostate  ? Cancer Cousin   ?     prostate  ? ? ?SOCIAL HISTORY:   ?Social History  ? ?Tobacco Use  ? Smoking status: Never  ? Smokeless tobacco: Never  ? Tobacco comments:  ?  Smoking cessation materials not required  ?Vaping Use  ? Vaping Use: Never used  ?Substance Use Topics  ? Alcohol use: No  ?  Alcohol/week: 0.0 standard drinks  ? Drug use: No  ? ? ?ALLERGIES:  has No Known Allergies. ? ?MEDICATIONS:  ?Current Outpatient Medications  ?Medication Sig Dispense Refill  ? amLODipine (NORVASC) 5 MG tablet Take 1 tablet (5 mg total) by mouth daily. 30 tablet 3  ? apixaban (ELIQUIS) 2.5 MG TABS tablet Take  1 tablet (2.5 mg total) by mouth 2 (two) times daily. 60 tablet 3  ? atorvastatin (LIPITOR) 40 MG tablet Take 1 tablet by mouth once daily 90 tablet 0  ? blood glucose meter kit and supplies Dispense based on patient and insurance preference (Accuchek Aviva Plus, Accuchek Nano). Use up to four times daily as directed. (FOR ICD-10 E10.9, E11.9). 1 each 0  ? gabapentin (NEURONTIN) 300 MG capsule Take 1 capsule by mouth at bedtime 90 capsule 2  ? Insulin Pen Needle (NOVOFINE PEN NEEDLE) 32G X 6 MM MISC 1 each by Does not apply route daily. 100 each 2   ? insulin regular human CONCENTRATED (HUMULIN R U-500 KWIKPEN) 500 UNIT/ML KwikPen Inject 1-10 Units into the skin 3 (three) times daily with meals. Pre-meal insulin :  ?Check fsbs prior to meals  ?Hold if belo

## 2022-01-20 NOTE — Assessment & Plan Note (Addendum)
#  Brain metastases - MAY 2022-s/p WBRT- MRI brain DEC 2022-likely. Radiation necrosis- currently s/p  Avastin #4 [finished FEB 2023]; MAY 8th, 2023- Three residual enhancing lesions (left temporal, left frontal, and midbrain) all show a few millimeters of increased enhancement. Minimal increased edema in the left temporal white matter. Although mild progression from February 2023, findings are still much less than seen December 2022.  Will discuss with Dr. Dr. Mickeal Skinner.  Patient not too keen on any therapy either Avastin or radiation. ?? 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 Possibly reactive, although metastatic disease cannot be excluded.  Recommend a PET scan for further evaluation. ? ?# Elevated BG- PBF- 144-off dexamethasone [poorly controlled blood sugars while on steroids].  Stable. ?? ??#Hypertension-currently well controlled off Avastin-continue  amlodipine 5 mg day.  ? ?# Bilateral PE & left lower extremity DVT:s/p IVC filter; currently on eliquis 2.5 g BID [5/13]-STABLE ? ?# CKD stage III-creatinine 1.5 [GFR~51]- STABLE;  ? ?# Peripheral Neuropathy- STABLE;  DM;continue  gabapentin 200 mg qhs-STABLE;  ? ?# IV access: PIV ? ?# DISPOSITION:   ?# follow up in  2 month --MD; labs cbc/cmp;urine protein creatinine ratio; PET scan prior--Dr.B ? ?# I reviewed the blood work- with the patient in detail; also reviewed the imaging independently [as summarized above]; and with the patient in detail.  ? ? ?Cc; Dr.Sowles.  ?

## 2022-01-28 ENCOUNTER — Other Ambulatory Visit: Payer: Self-pay | Admitting: Family Medicine

## 2022-01-28 ENCOUNTER — Other Ambulatory Visit: Payer: Self-pay | Admitting: Internal Medicine

## 2022-01-29 ENCOUNTER — Encounter: Payer: Self-pay | Admitting: Internal Medicine

## 2022-02-18 NOTE — Progress Notes (Deleted)
Name: Melvin Willis   MRN: [id number]    DOB: 1951/08/23   Date:02/18/2022       Progress Note  Subjective  Chief Complaint  Follow Up  HPI  DMII: he is on Soliqua  26 units daily and his fasting glucose was controlled, but he was placed on dexamethasone tablets 2 mg daily but over the past 8 weeks only on half dose.  Glucose over the past week has improved from 88-140's except for this morning that spiked to 222 likely because he had apple pie and ice cream last night.  We had discussed with wife going up on Soliqua but she decided to just change his diet. We gave him Humulin R to use pre-meal 5-10 units per meals but wife is only using occasionally , explained she can safely give him 2 units before every meal if worried about hypoglycemia.   We will recheck labs   Chronic DVT left leg, history of PE : off Xarelto ( stopped March 22 due to hemorrhagic brain mets) but is now  on Eliquis because after he stopped Xarelto had recurrence of DVT, he also had IVC filter May 24 th, 2022. He denies easy bruising. He was diagnosed with DVT and PE prior to cancer diagnosis back in 2017. He has leg pains, chronic and stable, he has sob with activity but also  stable. 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 Rogue Bussing, he has finished radiation by Dr. Donella Stade. He has baseline sob with activity .we will send shingles and Tdap vaccine    History of CVA: initially had dizziness , he now has weakness on left side and finished PT, getting better daily, still taking Eliquis low dose and statin therapy. Stable   Atherosclerosis of aorta: he states taking Atorvastatin daily now , denies side effects Last LDL was at goal    CKI stage III: GFR has been between 47-60, going back and forth, he denies pruritis, good urine output ,last urine micro slightly above goal   BPH and urinary retention : admitted 12/2020 with UTI, he is under the  care of Dr. 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   Hyperlipidemia associated with type 2 diabetes mellitus ([place]) 02/05/2021   Deep vein thrombosis (DVT) of proximal lower extremity ([place])    Urinary retention 12/29/2020   Leg DVT (deep venous thromboembolism), acute, bilateral ([place]) 12/29/2020   Delirium    Palliative care encounter    Pneumonia due to infectious organism    Supratherapeutic INR 11/14/2020   Acute-on-chronic kidney injury ([place]) 11/14/2020   Brain metastasis 09/25/2019   Atherosclerosis of aorta ([place]) 09/04/2019   Primary malignant neoplasm of lung metastatic to other site [facility]) 04/07/2019   Chronic deep vein thrombosis (DVT) of left lower extremity ([place]) 01/05/2018   Chronic kidney disease (CKD), stage III (moderate) ([place]) 11/27/2016   Blurred vision, bilateral 06/23/2016   Primary cancer of left upper lobe of lung ([place]) 06/12/2016   Mediastinal mass    History of nephrolithiasis 03/27/2016   Pharyngeal dysphagia 01/01/2016   Benign neoplasm of ascending colon    Benign neoplasm of sigmoid colon    Benign neoplasm of transverse colon    Diverticulosis of large intestine without diverticulitis    Overweight (BMI  25.0-29.9) 04/16/2015   Type 2 diabetes mellitus with stage 3 chronic kidney disease ([place]) 03/18/2015   Dyslipidemia 03/18/2015   Disorder of male genital organ 08/08/2012   Nodular prostate with urinary obstruction 08/08/2012   Elevated PSA 08/08/2012    Past Surgical History:  Procedure Laterality Date   COLONOSCOPY     COLONOSCOPY WITH PROPOFOL N/A 05/06/2015   Procedure: COLONOSCOPY WITH PROPOFOL;  Surgeon: Lucilla Lame, MD;  Location: [place];  Service: Endoscopy;  Laterality: N/A;  ASCENDING COLON POLYPS X 2 TERMINAL ILEUM BIOPSY RANDOM COLON BX. TRANSVERSE COLON POLYP SIGMOID COLON  POLYP   ESOPHAGOGASTRODUODENOSCOPY (EGD) WITH PROPOFOL N/A 05/06/2015   Procedure: ESOPHAGOGASTRODUODENOSCOPY (EGD) WITH PROPOFOL;  Surgeon: Lucilla Lame, MD;  Location: [place];  Service: Endoscopy;  Laterality: N/A;  GASTRIC BIOPSY X1   HOLEP-LASER ENUCLEATION OF THE PROSTATE WITH MORCELLATION N/A 01/10/2021   Procedure: HOLEP-LASER ENUCLEATION OF THE PROSTATE WITH MORCELLATION;  Surgeon: Billey Co, MD;  Location: [facility];  Service: Urology;  Laterality: N/A;   IVC FILTER INSERTION N/A 12/29/2020   Procedure: IVC FILTER INSERTION;  Surgeon: Dwana Curd, MD;  Location: [place] [facility];  Service: Cardiovascular;  Laterality: N/A;   KIDNEY STONE SURGERY  2017    Family History  Problem Relation Age of Onset   Diabetes Mother    Diabetes Father    CAD Father    Dementia Father    Diabetes Sister    Cancer Maternal Uncle        Prostate   Cancer Cousin        prostate    Social History   Tobacco Use   Smoking status: Never   Smokeless tobacco: Never   Tobacco comments:    Smoking cessation materials not required  Substance Use Topics   Alcohol use: No    Alcohol/week: 0.0 standard drinks of alcohol     Current Outpatient Medications:    amLODipine (NORVASC) 5 MG tablet, Take 1 tablet by mouth once daily, Disp: 30 tablet, Rfl: 0   apixaban (ELIQUIS) 2.5 MG TABS tablet, Take 1 tablet (2.5 mg total) by mouth 2 (two) times daily., Disp: 60 tablet, Rfl: 3   atorvastatin (LIPITOR) 40 MG tablet, Take 1 tablet by mouth once daily, Disp: 90 tablet, Rfl: 0   blood glucose meter kit and supplies, Dispense based on patient and insurance preference (Accuchek Aviva Plus, Accuchek Nano). Use up to four times daily as directed. (FOR ICD-10 E10.9, E11.9)., Disp: 1 each, Rfl: 0   dexamethasone (DECADRON) 1 MG tablet, Take 1 tablet (1 mg total) by mouth daily. (Patient not taking: Reported on 01/20/2022), Disp: 60 tablet, Rfl: 1   gabapentin (NEURONTIN) 300 MG capsule,  Take 1 capsule by mouth at bedtime, Disp: 90 capsule, Rfl: 2   Insulin Pen Needle (NOVOFINE PEN NEEDLE) 32G X 6 MM MISC, 1 each by Does not apply route daily., Disp: 100 each, Rfl: 2   insulin regular human CONCENTRATED (HUMULIN R U-500 KWIKPEN) 500 UNIT/ML KwikPen, Inject 1-10 Units into the skin 3 (three) times daily with meals. Pre-meal insulin :  Check fsbs prior to meals  Hold if below 130 If glucose 130- 140 give 2 units If glucose 140 to 180 give 4 units  If glucose 180 to 250 give 6 units If glucose above 250 give 8 units, Disp: 15 mL, Rfl: 0   levETIRAcetam (KEPPRA) 500 MG tablet, Take 1 tablet (500 mg total) by mouth 2 (two) times daily., Disp:  60 tablet, Rfl: 3   lorlatinib (LORBRENA) 25 MG tablet, Take 2 tablets (50 mg total) by mouth daily. (Patient not taking: Reported on 12/22/2021), Disp: 60 tablet, Rfl: 1   SOLIQUA 100-33 UNT-MCG/ML SOPN, INJECT 15-30 UNITS  SUBCUTANEOUSLY ONCE DAILY, Disp: 15 mL, Rfl: 0  No Known Allergies  I personally reviewed active problem list, medication list, allergies, family history, social history, health maintenance with the patient/caregiver today.   ROS  ***  Objective  There were no vitals filed for this visit.  There is no height or weight on file to calculate BMI.  Physical Exam ***  Recent Results (from the past 2160 hour(s))  Protein / creatinine ratio, urine     Status: Abnormal   Collection Time: 12/05/21  2:30 PM  Result Value Ref Range   Creatinine, Urine 169 mg/dL   Total Protein, Urine 34 mg/dL    Comment: NO NORMAL RANGE ESTABLISHED FOR THIS TEST   Protein Creatinine Ratio 0.20 (H) 0.00 - 0.15 mg/mg[Cre]    Comment: Performed at [facility], [address] [place], [place] [address]  CBC with Differential     Status: Abnormal   Collection Time: 12/05/21  2:30 PM  Result Value Ref Range   WBC 8.0 4.0 - 10.5 K/uL   RBC 6.07 (H) 4.22 - 5.81 MIL/uL   Hemoglobin 17.8 (H) 13.0 - 17.0 g/dL   HCT 52.6 (H) 39.0 -  52.0 %   MCV 86.7 80.0 - 100.0 fL   MCH 29.3 26.0 - 34.0 pg   MCHC 33.8 30.0 - 36.0 g/dL   RDW 15.6 (H) 11.5 - 15.5 %   Platelets 184 150 - 400 K/uL   nRBC 0.0 0.0 - 0.2 %   Neutrophils Relative % 68 %   Neutro Abs 5.5 1.7 - 7.7 K/uL   Lymphocytes Relative 21 %   Lymphs Abs 1.7 0.7 - 4.0 K/uL   Monocytes Relative 9 %   Monocytes Absolute 0.7 0.1 - 1.0 K/uL   Eosinophils Relative 1 %   Eosinophils Absolute 0.1 0.0 - 0.5 K/uL   Basophils Relative 1 %   Basophils Absolute 0.0 0.0 - 0.1 K/uL   Immature Granulocytes 0 %   Abs Immature Granulocytes 0.02 0.00 - 0.07 K/uL    Comment: Performed at [facility], [address] [place], [place] [address]  Comprehensive metabolic panel     Status: Abnormal   Collection Time: 12/05/21  2:30 PM  Result Value Ref Range   Sodium 134 (L) 135 - 145 mmol/L   Potassium 4.0 3.5 - 5.1 mmol/L   Chloride 101 98 - 111 mmol/L   CO2 25 22 - 32 mmol/L   Glucose, Bld 172 (H) 70 - 99 mg/dL    Comment: Glucose reference range applies only to samples taken after fasting for at least 8 hours.   BUN 17 8 - 23 mg/dL   Creatinine, Ser 1.06 0.61 - 1.24 mg/dL   Calcium 9.5 8.9 - 10.3 mg/dL   Total Protein 7.2 6.5 - 8.1 g/dL   Albumin 4.0 3.5 - 5.0 g/dL   AST 34 15 - 41 U/L   ALT 83 (H) 0 - 44 U/L   Alkaline Phosphatase 83 38 - 126 U/L   Total Bilirubin 0.8 0.3 - 1.2 mg/dL   GFR, Estimated >60 >60 mL/min    Comment: (NOTE) Calculated using the CKD-EPI Creatinine Equation (2021)    Anion gap 8 5 - 15    Comment: Performed  at [facility], [address] [place], [place] [address]  CBC with Differential     Status: None   Collection Time: 12/22/21 12:38 PM  Result Value Ref Range   WBC 5.6 4.0 - 10.5 K/uL   RBC 5.72 4.22 - 5.81 MIL/uL   Hemoglobin 17.0 13.0 - 17.0 g/dL   HCT 49.6 39.0 - 52.0 %   MCV 86.7 80.0 - 100.0 fL   MCH 29.7 26.0 - 34.0 pg   MCHC 34.3 30.0 - 36.0 g/dL   RDW 14.4 11.5 - 15.5 %   Platelets 177 150 - 400 K/uL   nRBC  0.0 0.0 - 0.2 %   Neutrophils Relative % 57 %   Neutro Abs 3.2 1.7 - 7.7 K/uL   Lymphocytes Relative 30 %   Lymphs Abs 1.7 0.7 - 4.0 K/uL   Monocytes Relative 11 %   Monocytes Absolute 0.6 0.1 - 1.0 K/uL   Eosinophils Relative 1 %   Eosinophils Absolute 0.1 0.0 - 0.5 K/uL   Basophils Relative 0 %   Basophils Absolute 0.0 0.0 - 0.1 K/uL   Immature Granulocytes 1 %   Abs Immature Granulocytes 0.03 0.00 - 0.07 K/uL    Comment: Performed at [facility], [address] [place], [place] [address]  Comprehensive metabolic panel     Status: Abnormal   Collection Time: 12/22/21 12:38 PM  Result Value Ref Range   Sodium 134 (L) 135 - 145 mmol/L   Potassium 3.7 3.5 - 5.1 mmol/L   Chloride 104 98 - 111 mmol/L   CO2 24 22 - 32 mmol/L   Glucose, Bld 270 (H) 70 - 99 mg/dL    Comment: Glucose reference range applies only to samples taken after fasting for at least 8 hours.   BUN 16 8 - 23 mg/dL   Creatinine, Ser 1.35 (H) 0.61 - 1.24 mg/dL   Calcium 9.0 8.9 - 10.3 mg/dL   Total Protein 7.0 6.5 - 8.1 g/dL   Albumin 3.6 3.5 - 5.0 g/dL   AST 23 15 - 41 U/L   ALT 55 (H) 0 - 44 U/L   Alkaline Phosphatase 79 38 - 126 U/L   Total Bilirubin 1.0 0.3 - 1.2 mg/dL   GFR, Estimated 57 (L) >60 mL/min    Comment: (NOTE) Calculated using the CKD-EPI Creatinine Equation (2021)    Anion gap 6 5 - 15    Comment: Performed at [facility], [address] [place], [place] [address]  CBC with Differential     Status: Abnormal   Collection Time: 01/20/22  2:30 PM  Result Value Ref Range   WBC 5.3 4.0 - 10.5 K/uL   RBC 5.71 4.22 - 5.81 MIL/uL   Hemoglobin 17.1 (H) 13.0 - 17.0 g/dL   HCT 50.1 39.0 - 52.0 %   MCV 87.7 80.0 - 100.0 fL   MCH 29.9 26.0 - 34.0 pg   MCHC 34.1 30.0 - 36.0 g/dL   RDW 13.1 11.5 - 15.5 %   Platelets 239 150 - 400 K/uL   nRBC 0.0 0.0 - 0.2 %   Neutrophils Relative % 47 %   Neutro Abs 2.5 1.7 - 7.7 K/uL   Lymphocytes Relative 41 %   Lymphs Abs 2.2 0.7 - 4.0 K/uL    Monocytes Relative 9 %   Monocytes Absolute 0.5 0.1 - 1.0 K/uL   Eosinophils Relative 2 %   Eosinophils Absolute 0.1 0.0 - 0.5 K/uL   Basophils Relative 1 %  Basophils Absolute 0.0 0.0 - 0.1 K/uL   Immature Granulocytes 0 %   Abs Immature Granulocytes 0.02 0.00 - 0.07 K/uL    Comment: Performed at [facility], [address] [place], [place] [address]  Comprehensive metabolic panel     Status: Abnormal   Collection Time: 01/20/22  2:30 PM  Result Value Ref Range   Sodium 136 135 - 145 mmol/L   Potassium 3.8 3.5 - 5.1 mmol/L   Chloride 104 98 - 111 mmol/L   CO2 22 22 - 32 mmol/L   Glucose, Bld 146 (H) 70 - 99 mg/dL    Comment: Glucose reference range applies only to samples taken after fasting for at least 8 hours.   BUN 11 8 - 23 mg/dL   Creatinine, Ser 1.18 0.61 - 1.24 mg/dL   Calcium 9.2 8.9 - 10.3 mg/dL   Total Protein 7.8 6.5 - 8.1 g/dL   Albumin 4.1 3.5 - 5.0 g/dL   AST 23 15 - 41 U/L   ALT 32 0 - 44 U/L   Alkaline Phosphatase 88 38 - 126 U/L   Total Bilirubin 0.9 0.3 - 1.2 mg/dL   GFR, Estimated >60 >60 mL/min    Comment: (NOTE) Calculated using the CKD-EPI Creatinine Equation (2021)    Anion gap 10 5 - 15    Comment: Performed at [facility], [address] [place], [place] [address]    Diabetic Foot Exam: Diabetic Foot Exam - Simple   No data filed    ***  PHQ2/9:    11/12/2021   10:44 AM 10/13/2021    9:30 AM 08/25/2021    3:25 PM 06/11/2021    8:42 AM 04/24/2021    1:44 PM  Depression screen PHQ 2/9  Decreased Interest 0 0 0 0 0  Down, Depressed, Hopeless 0 0 0 0 0  PHQ - 2 Score 0 0 0 0 0  Altered sleeping 0 0 0    Tired, decreased energy 0 0 0    Change in appetite 0 0 0    Feeling bad or failure about yourself  0 0 0    Trouble concentrating 0 0 0    Moving slowly or fidgety/restless 0 0 0    Suicidal thoughts 0 0 0    PHQ-9 Score 0 0 0      phq 9 is {gen pos [id number]}   Fall Risk:    11/12/2021   10:44 AM 10/13/2021     9:30 AM 08/25/2021    3:25 PM 06/11/2021    8:41 AM 04/24/2021    1:46 PM  Fall Risk   Falls in the past year? 0 0 0 0 1  Number falls in past yr: 0 0 0 0 1  Injury with Fall? 0 0 0 0 0  Risk for fall due to : No Fall Risks No Fall Risks No Fall Risks History of fall(s) History of fall(s)  Follow up _0       Functional Status Survey:      Assessment & Plan  *** There are no diagnoses linked to this encounter.

## 2022-02-19 ENCOUNTER — Other Ambulatory Visit: Payer: Self-pay | Admitting: Internal Medicine

## 2022-02-19 ENCOUNTER — Ambulatory Visit: Payer: Medicare Other | Admitting: Family Medicine

## 2022-02-19 DIAGNOSIS — Z794 Long term (current) use of insulin: Secondary | ICD-10-CM

## 2022-02-19 DIAGNOSIS — I82403 Acute embolism and thrombosis of unspecified deep veins of lower extremity, bilateral: Secondary | ICD-10-CM

## 2022-03-04 ENCOUNTER — Other Ambulatory Visit: Payer: Self-pay | Admitting: Internal Medicine

## 2022-03-04 ENCOUNTER — Other Ambulatory Visit: Payer: Self-pay | Admitting: Family Medicine

## 2022-03-04 DIAGNOSIS — E1169 Type 2 diabetes mellitus with other specified complication: Secondary | ICD-10-CM

## 2022-03-04 DIAGNOSIS — I69354 Hemiplegia and hemiparesis following cerebral infarction affecting left non-dominant side: Secondary | ICD-10-CM

## 2022-03-04 DIAGNOSIS — I700 Atherosclerosis of aorta: Secondary | ICD-10-CM

## 2022-03-04 DIAGNOSIS — E7800 Pure hypercholesterolemia, unspecified: Secondary | ICD-10-CM

## 2022-03-05 ENCOUNTER — Encounter: Payer: Self-pay | Admitting: Internal Medicine

## 2022-03-05 NOTE — Telephone Encounter (Signed)
Requested Prescriptions  Pending Prescriptions Disp Refills  . atorvastatin (LIPITOR) 40 MG tablet [Pharmacy Med Name: Atorvastatin Calcium 40 MG Oral Tablet] 90 tablet 0    Sig: Take 1 tablet by mouth once daily     Cardiovascular:  Antilipid - Statins Failed - 03/04/2022  7:20 PM      Failed - Lipid Panel in normal range within the last 12 months    Cholesterol, Total  Date Value Ref Range Status  10/01/2015 155 100 - 199 mg/dL Final   Cholesterol  Date Value Ref Range Status  03/12/2021 117 0 - 200 mg/dL Final   LDL Cholesterol (Calc)  Date Value Ref Range Status  07/26/2020 81 mg/dL (calc) Final    Comment:    Reference range: <100 . Desirable range <100 mg/dL for primary prevention;   <70 mg/dL for patients with CHD or diabetic patients  with > or = 2 CHD risk factors. Marland Kitchen LDL-C is now calculated using the Martin-Hopkins  calculation, which is a validated novel method providing  better accuracy than the Friedewald equation in the  estimation of LDL-C.  Cresenciano Genre et al. Annamaria Helling. 5009;381(82): 2061-2068  (http://education.[facility].com/faq/FAQ164)    LDL Cholesterol  Date Value Ref Range Status  03/12/2021 62 0 - 99 mg/dL Final    Comment:           Total Cholesterol/HDL:CHD Risk Coronary Heart Disease Risk Table                     Men   Women  1/2 Average Risk   3.4   3.3  Average Risk       5.0   4.4  2 X Average Risk   9.6   7.1  3 X Average Risk  23.4   11.0        Use the calculated Patient Ratio above and the CHD Risk Table to determine the patient's CHD Risk.        ATP III CLASSIFICATION (LDL):  <100     mg/dL   Optimal  100-129  mg/dL   Near or Above                    Optimal  130-159  mg/dL   Borderline  160-189  mg/dL   High  >190     mg/dL   Very High Performed at [facility], [place], [place] [address]    HDL  Date Value Ref Range Status  03/12/2021 41 >40 mg/dL Final  10/01/2015 51 >39 mg/dL Final    Triglycerides  Date Value Ref Range Status  03/12/2021 71 <150 mg/dL Final         Passed - Patient is not pregnant      Passed - Valid encounter within last 12 months    Recent Outpatient Visits          3 months ago Type 2 diabetes mellitus with hyperglycemia, with long-term current use of insulin [facility])   [facility] [place], Drue Stager, MD   4 months ago Type 2 diabetes mellitus with hyperglycemia, with long-term current use of insulin [facility])   [facility] [place], Drue Stager, MD   6 months ago Type 2 diabetes mellitus with hyperglycemia, with long-term current use of insulin [facility])   [facility] [place], Drue Stager, MD   8 months ago Hyperlipidemia associated with type 2 diabetes mellitus [facility])   [facility] [place], [place],  MD   1 year ago Hyperlipidemia associated with type 2 diabetes mellitus [facility])   [facility] [name], MD      Future Appointments            In 1 month [facility], [facility]

## 2022-03-15 ENCOUNTER — Other Ambulatory Visit: Payer: Self-pay | Admitting: Internal Medicine

## 2022-03-16 ENCOUNTER — Ambulatory Visit
Admission: RE | Admit: 2022-03-16 | Discharge: 2022-03-16 | Disposition: A | Discharge status: Home / Self Care | Payer: Medicare Other | Source: Ambulatory Visit | Attending: Internal Medicine | Admitting: Internal Medicine

## 2022-03-16 DIAGNOSIS — C3412 Malignant neoplasm of upper lobe, left bronchus or lung: Secondary | ICD-10-CM

## 2022-03-17 ENCOUNTER — Encounter: Payer: Self-pay | Admitting: Internal Medicine

## 2022-03-19 ENCOUNTER — Ambulatory Visit
Admission: RE | Admit: 2022-03-19 | Discharge: 2022-03-19 | Disposition: A | Discharge status: Home / Self Care | Payer: Medicare Other | Source: Ambulatory Visit | Attending: Internal Medicine | Admitting: Internal Medicine

## 2022-03-19 DIAGNOSIS — J90 Pleural effusion, not elsewhere classified: Secondary | ICD-10-CM | POA: Diagnosis not present

## 2022-03-19 DIAGNOSIS — Z9221 Personal history of antineoplastic chemotherapy: Secondary | ICD-10-CM | POA: Insufficient documentation

## 2022-03-19 DIAGNOSIS — C771 Secondary and unspecified malignant neoplasm of intrathoracic lymph nodes: Secondary | ICD-10-CM | POA: Diagnosis not present

## 2022-03-19 DIAGNOSIS — I700 Atherosclerosis of aorta: Secondary | ICD-10-CM | POA: Diagnosis not present

## 2022-03-19 DIAGNOSIS — R590 Localized enlarged lymph nodes: Secondary | ICD-10-CM | POA: Diagnosis not present

## 2022-03-19 DIAGNOSIS — C3412 Malignant neoplasm of upper lobe, left bronchus or lung: Secondary | ICD-10-CM | POA: Insufficient documentation

## 2022-03-19 LAB — GLUCOSE, CAPILLARY: Glucose-Capillary: 143 mg/dL — ABNORMAL HIGH (ref 70–99)

## 2022-03-19 MED ORDER — FLUDEOXYGLUCOSE F - 18 (FDG) INJECTION
7.9000 | Freq: Once | INTRAVENOUS | Status: AC | PRN
Start: 2022-03-19 — End: 2022-03-19
  Administered 2022-03-19: 8.48 via INTRAVENOUS

## 2022-03-23 ENCOUNTER — Inpatient Hospital Stay (HOSPITAL_BASED_OUTPATIENT_CLINIC_OR_DEPARTMENT_OTHER): Payer: Medicare Other | Admitting: Internal Medicine

## 2022-03-23 ENCOUNTER — Inpatient Hospital Stay: Payer: Medicare Other | Attending: Internal Medicine

## 2022-03-23 ENCOUNTER — Encounter: Payer: Self-pay | Admitting: Internal Medicine

## 2022-03-23 DIAGNOSIS — C771 Secondary and unspecified malignant neoplasm of intrathoracic lymph nodes: Secondary | ICD-10-CM | POA: Diagnosis not present

## 2022-03-23 DIAGNOSIS — R5383 Other fatigue: Secondary | ICD-10-CM | POA: Insufficient documentation

## 2022-03-23 DIAGNOSIS — I2699 Other pulmonary embolism without acute cor pulmonale: Secondary | ICD-10-CM | POA: Diagnosis not present

## 2022-03-23 DIAGNOSIS — C3412 Malignant neoplasm of upper lobe, left bronchus or lung: Secondary | ICD-10-CM

## 2022-03-23 DIAGNOSIS — C7931 Secondary malignant neoplasm of brain: Secondary | ICD-10-CM | POA: Diagnosis not present

## 2022-03-23 DIAGNOSIS — R410 Disorientation, unspecified: Secondary | ICD-10-CM | POA: Insufficient documentation

## 2022-03-23 DIAGNOSIS — M2550 Pain in unspecified joint: Secondary | ICD-10-CM | POA: Diagnosis not present

## 2022-03-23 DIAGNOSIS — R413 Other amnesia: Secondary | ICD-10-CM | POA: Insufficient documentation

## 2022-03-23 DIAGNOSIS — Z8719 Personal history of other diseases of the digestive system: Secondary | ICD-10-CM | POA: Diagnosis not present

## 2022-03-23 DIAGNOSIS — C7951 Secondary malignant neoplasm of bone: Secondary | ICD-10-CM | POA: Diagnosis not present

## 2022-03-23 DIAGNOSIS — E1122 Type 2 diabetes mellitus with diabetic chronic kidney disease: Secondary | ICD-10-CM | POA: Diagnosis not present

## 2022-03-23 DIAGNOSIS — Z8673 Personal history of transient ischemic attack (TIA), and cerebral infarction without residual deficits: Secondary | ICD-10-CM | POA: Diagnosis not present

## 2022-03-23 DIAGNOSIS — Z9079 Acquired absence of other genital organ(s): Secondary | ICD-10-CM | POA: Diagnosis not present

## 2022-03-23 DIAGNOSIS — I129 Hypertensive chronic kidney disease with stage 1 through stage 4 chronic kidney disease, or unspecified chronic kidney disease: Secondary | ICD-10-CM | POA: Diagnosis not present

## 2022-03-23 DIAGNOSIS — Z86711 Personal history of pulmonary embolism: Secondary | ICD-10-CM | POA: Diagnosis not present

## 2022-03-23 DIAGNOSIS — Z8042 Family history of malignant neoplasm of prostate: Secondary | ICD-10-CM | POA: Insufficient documentation

## 2022-03-23 DIAGNOSIS — Z7901 Long term (current) use of anticoagulants: Secondary | ICD-10-CM | POA: Diagnosis not present

## 2022-03-23 DIAGNOSIS — Z818 Family history of other mental and behavioral disorders: Secondary | ICD-10-CM | POA: Diagnosis not present

## 2022-03-23 DIAGNOSIS — N183 Chronic kidney disease, stage 3 unspecified: Secondary | ICD-10-CM | POA: Diagnosis not present

## 2022-03-23 DIAGNOSIS — Z79899 Other long term (current) drug therapy: Secondary | ICD-10-CM | POA: Diagnosis not present

## 2022-03-23 DIAGNOSIS — Z8249 Family history of ischemic heart disease and other diseases of the circulatory system: Secondary | ICD-10-CM | POA: Diagnosis not present

## 2022-03-23 DIAGNOSIS — Z833 Family history of diabetes mellitus: Secondary | ICD-10-CM | POA: Diagnosis not present

## 2022-03-23 LAB — COMPREHENSIVE METABOLIC PANEL
ALT: 27 U/L (ref 0–44)
AST: 20 U/L (ref 15–41)
Albumin: 4 g/dL (ref 3.5–5.0)
Alkaline Phosphatase: 99 U/L (ref 38–126)
Anion gap: 6 (ref 5–15)
BUN: 12 mg/dL (ref 8–23)
CO2: 25 mmol/L (ref 22–32)
Calcium: 10 mg/dL (ref 8.9–10.3)
Chloride: 106 mmol/L (ref 98–111)
Creatinine, Ser: 1.24 mg/dL (ref 0.61–1.24)
GFR, Estimated: >60 mL/min (ref >60)
Glucose, Bld: 107 mg/dL — ABNORMAL HIGH (ref 70–99)
Potassium: 3.8 mmol/L (ref 3.5–5.1)
Sodium: 137 mmol/L (ref 135–145)
Total Bilirubin: 1 mg/dL (ref 0.3–1.2)
Total Protein: 8.1 g/dL (ref 6.5–8.1)

## 2022-03-23 LAB — CBC WITH DIFFERENTIAL/PLATELET
Abs Immature Granulocytes: 0.01 10*3/uL (ref 0.00–0.07)
Basophils Absolute: 0 10*3/uL (ref 0.0–0.1)
Basophils Relative: 0 %
Eosinophils Absolute: 0.2 10*3/uL (ref 0.0–0.5)
Eosinophils Relative: 3 %
HCT: 49.9 % (ref 39.0–52.0)
Hemoglobin: 16.6 g/dL (ref 13.0–17.0)
Immature Granulocytes: 0 %
Lymphocytes Relative: 32 %
Lymphs Abs: 2.1 10*3/uL (ref 0.7–4.0)
MCH: 29 pg (ref 26.0–34.0)
MCHC: 33.3 g/dL (ref 30.0–36.0)
MCV: 87.2 fL (ref 80.0–100.0)
Monocytes Absolute: 0.8 10*3/uL (ref 0.1–1.0)
Monocytes Relative: 12 %
Neutro Abs: 3.6 10*3/uL (ref 1.7–7.7)
Neutrophils Relative %: 53 %
Platelets: 239 10*3/uL (ref 150–400)
RBC: 5.72 MIL/uL (ref 4.22–5.81)
RDW: 13 % (ref 11.5–15.5)
WBC: 6.7 10*3/uL (ref 4.0–10.5)
nRBC: 0 % (ref 0.0–0.2)

## 2022-03-23 NOTE — Assessment & Plan Note (Addendum)
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 JULY 15th, 2023-  Evidence recurrent FDG avid tumor within the area radiation change in the perihilar left lung;  Bilateral FDG avid hilar and mediastinal nodal metastasis;  New FDG avid posterior mediastinal adenopathy within the distal juxta-esophageal region, just above the GE junction; New left retrocrural and upper abdominal retroperitoneal nodal metastasis;  Mild nodularity of the left adrenal gland with corresponding increased tracer uptake is new from previous imaging. Suspicious for early metastasis; New small to moderate left pleural effusion.  See discussion below.   # Brain metastases - MAY 2022-s/p WBRT- MRI brain DEC 2022-likely. Radiation necrosis- currently s/p  Avastin #4 [finished FEB 2023]; MAY 8th, 2023- Three residual enhancing lesions (left temporal, left frontal, and midbrain) all show a few millimeters of increased enhancement. Minimal increased edema in the left temporal white matter. JULY 15th, 2023- PET Increased radiotracer uptake localizes to the known metastatic lesion in the left frontal lobe.  Patient not too keen on any therapy either Avastin or radiation.  Hold off MRI brain at this time.  See discussion below  # Elevated BG- PBF- 144-off dexamethasone [poorly controlled blood sugars while on steroids]. STABLE.   #Hypertension-currently well controlled off Avastin-continue  amlodipine 5 mg day.   # Bilateral PE & left lower extremity DVT:s/p IVC filter; currently on eliquis 2.5 g BID [5/13]-STABLE  # CKD stage III-creatinine 1.5 [GFR~51]- STABLE;   # Peripheral Neuropathy- STABLE;  DM;continue  gabapentin 200 mg qhs-STABLE;   # IV access: PIV  PROGNOSIS: Discussed the treatment options include oral targeted therapy, to which patient had extremely poor tolerance.  Discussed the option of chemotherapy again suspect poor tolerance.  Given  the incurable nature of the disease /poor tolerance of therapy and in general less than 6 months of life expectancy-I introduced hospice philosophy to the patient and family.  Discussed that goal of care should be directed to symptom management rather than treating the underlying disease; and in the process help improve quality of life rather than quantity.  Discussed with hospice team would include-nurse, nurse aide, social worker and chaplain for help take care of patient with physical/emotional needs.  Patient's wife aware of hospice option.  They are in agreement.  # DISPOSITION:   # hospice referral  Re: lung cancer/brain metstases # follow up in 6-8 weeks--MD; labs cbc/cmp;portflush -Dr.B  # I reviewed the blood work- with the patient in detail; also reviewed the imaging independently [as summarized above]; and with the patient in detail.    # 40 minutes face-to-face with the patient discussing the above plan of care; more than 50% of time spent on prognosis/ natural history; counseling and coordination.   Cc; Dr.Sowles.

## 2022-03-23 NOTE — Progress Notes (Signed)
[place] OFFICE PROGRESS NOTE  Patient Care Team: [name], [name], [name], [name], [facility] (Pharmacist) Ventura Sellers, MD as Consulting Physician (Psychiatry) Billey Co, MD as Consulting Physician (Urology)   Cancer Staging  No matching staging information was found for the patient.   Oncology History Overview Note  # OCT 2017- ADENO CA LUNG; STAGE IV [; LUL; bil supraclavicular LN; Left neck LN Bx]; ROS-1 MUTATED; s/p Carbo-alimta x1[oct 2017]  # NOV 1st 2017- XALKORI 250 mg BID; JAN 15th CT- PR;  # AUG 27th Chemo-RT to persistent LUL/mediastinal LN [s/p carbo-taxol- with RT; finished Sep 22nd 2018];   # July 14th 2020- multiple metastatic lesions of the brain [July 30 whole brain radiation finished April 21, 2019]; bone scan skull metastases /posterior left ninth rib metastases; April 04, 2019 stopped crizotinib;  #April 25, 2019-start Entrectinib 600 mg once a day; STopped in NOV 2020 [dizziness]; NOV 2020-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  # DEC 1st 2020- start avastin q 2w; x6; MRI October 30, 2019-significantly improved left temporal lesion subcentimeter for stable lesions.;  [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   #June 2022-stopped lorlatinib [severe delirium/aggression]; currently on surveillance.  # DEC  2022-MRI brain- ?  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  MRI February 6, 23-significant response  In the interim patient also underwent-HOLEP on May 6.   # LLE DVT/bil PE/Multiple strokes [? 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    # MRI brain- multiple infarcts [2d echo/bubble study-NEG's/p Neurology eval]  ------------------------------------------------------------    # # s/p TURP [Sep 2017; Dr.Cope] DEC 26th CT- distended bladder;  # Elevated PSA-  FEB 2021- 12;  S/p Bx- Negative; follow with urology/Dr.Stoioff   # MOLECULAR TESTING- ROS-1 POSITIVE; ALK/EGFR-NEG; PDL-1 EXPRESSION- 90%** [HIGH]  # DEC 15th 2020- PALLIATIVE CARE -----------------------------------------------------------------    DIAGNOSIS: 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  Accompanied by his wife.  Melvin Willis 71 y.o.  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 radiation necrosis] currently s/p Avastin [Feb 2023] is here for follow-up to review the results of PET scan.  As per the wife patient continues to have intermittent confusion episodes.  Patient denies any headaches.  No nausea no vomiting.  Patient is currently off dexamethasone.  Blood sugars better controlled.  No leg swelling but weight gain.  No falls.He continues with Eliquis.  Review of Systems  Constitutional:  Positive for malaise/fatigue. Negative for chills, diaphoresis, fever and weight  loss.  HENT:  Negative for nosebleeds and sore  throat.   Eyes:  Negative for double vision.  Respiratory:  Negative for cough, hemoptysis, sputum production, shortness of breath and wheezing.   Cardiovascular:  Negative for chest pain (Left upper chest wall pain chronic), palpitations and orthopnea.  Gastrointestinal:  Negative for abdominal pain, blood in stool, constipation, diarrhea, heartburn and melena.  Genitourinary:  Negative for dysuria.  Musculoskeletal:  Positive for joint pain. Negative for back pain.  Skin: Negative.  Negative for itching and rash.  Neurological:  Positive for tingling. Negative for focal weakness and weakness.  Endo/Heme/Allergies:  Does not bruise/bleed easily.  Psychiatric/Behavioral:  Positive for memory loss. Negative for depression. The patient is not nervous/anxious and does not have insomnia.     PAST MEDICAL HISTORY :  Past Medical History:  Diagnosis Date   Abnormal prostate specific antigen 08/08/2012   Adiposity 04/16/2015   Chronic kidney disease (CKD), stage III (moderate) ([place]) 11/27/2016   CVA (cerebral vascular accident) ([place]) 06/17/2016   Diabetes mellitus without complication ([place])    Diverticulosis of sigmoid colon 04/16/2015   Dyslipidemia 03/18/2015   Hemorrhoids, internal 04/16/2015   Hypercholesteremia 04/16/2015   Hyperlipidemia    Hypertension    Primary cancer of left upper lobe of lung ([place])    Pulmonary embolism ([place])    Wears dentures    partial upper    PAST SURGICAL HISTORY :   Past Surgical History:  Procedure Laterality Date   COLONOSCOPY     COLONOSCOPY WITH PROPOFOL N/A 05/06/2015   Procedure: COLONOSCOPY WITH PROPOFOL;  Surgeon: Lucilla Lame, MD;  Location: [place] [facility];  Service: Endoscopy;  Laterality: N/A;  ASCENDING COLON POLYPS X 2 TERMINAL ILEUM BIOPSY RANDOM COLON BX. TRANSVERSE COLON POLYP SIGMOID COLON POLYP   ESOPHAGOGASTRODUODENOSCOPY (EGD) WITH PROPOFOL N/A 05/06/2015   Procedure: ESOPHAGOGASTRODUODENOSCOPY (EGD) WITH PROPOFOL;  Surgeon: Lucilla Lame, MD;  Location: [place];  Service: Endoscopy;  Laterality: N/A;  GASTRIC BIOPSY X1   HOLEP-LASER ENUCLEATION OF THE PROSTATE WITH MORCELLATION N/A 01/10/2021   Procedure: HOLEP-LASER ENUCLEATION OF THE PROSTATE WITH MORCELLATION;  Surgeon: Billey Co, MD;  Location: [facility];  Service: Urology;  Laterality: N/A;   IVC FILTER INSERTION N/A 12/29/2020   Procedure: IVC FILTER INSERTION;  Surgeon: Dwana Curd, MD;  Location: [facility];  Service: Cardiovascular;  Laterality: N/A;   KIDNEY STONE SURGERY  2017    FAMILY HISTORY :   Family History  Problem Relation Age of Onset   Diabetes Mother    Diabetes Father    CAD Father    Dementia Father    Diabetes Sister    Cancer Maternal Uncle        Prostate   Cancer Cousin        prostate    SOCIAL HISTORY:   Social History   Tobacco Use   Smoking status: Never   Smokeless tobacco: Never   Tobacco comments:    Smoking cessation materials not required  Vaping Use   Vaping Use: Never used  Substance Use Topics   Alcohol use: No    Alcohol/week: 0.0 standard drinks of alcohol   Drug use: No    ALLERGIES:  has No Known Allergies.  MEDICATIONS:  Current Outpatient Medications  Medication Sig Dispense Refill   amLODipine (NORVASC) 5 MG tablet Take 1 tablet by mouth once daily 30 tablet 0   atorvastatin (LIPITOR) 40 MG tablet Take 1 tablet by mouth once  daily 90 tablet 0   blood glucose meter kit and supplies Dispense based on patient and insurance preference (Accuchek Aviva Plus, Accuchek Nano). Use up to four times daily as directed. (FOR ICD-10 E10.9, E11.9). 1 each 0   ELIQUIS 2.5 MG TABS tablet Take 1 tablet by mouth twice daily 60 tablet 0   gabapentin (NEURONTIN) 300 MG capsule Take 1 capsule by mouth at bedtime 90 capsule 2   Insulin Pen Needle (NOVOFINE PEN NEEDLE) 32G X 6 MM MISC 1 each by Does not apply route daily. 100 each 2   insulin regular human CONCENTRATED (HUMULIN R U-500  KWIKPEN) 500 UNIT/ML KwikPen Inject 1-10 Units into the skin 3 (three) times daily with meals. Pre-meal insulin :  Check fsbs prior to meals  Hold if below 130 If glucose 130- 140 give 2 units If glucose 140 to 180 give 4 units  If glucose 180 to 250 give 6 units If glucose above 250 give 8 units 15 mL 0   levETIRAcetam (KEPPRA) 500 MG tablet Take 1 tablet (500 mg total) by mouth 2 (two) times daily. 60 tablet 3   SOLIQUA 100-33 UNT-MCG/ML SOPN INJECT 15-30 UNITS  SUBCUTANEOUSLY ONCE DAILY 15 mL 0   dexamethasone (DECADRON) 1 MG tablet Take 1 tablet (1 mg total) by mouth daily. (Patient not taking: Reported on 01/20/2022) 60 tablet 1   lorlatinib (LORBRENA) 25 MG tablet Take 2 tablets (50 mg total) by mouth daily. (Patient not taking: Reported on 12/22/2021) 60 tablet 1   No current facility-administered medications for this visit.    PHYSICAL EXAMINATION: ECOG PERFORMANCE STATUS: 0 - Asymptomatic  BP 124/83 (BP Location: Left Arm, Patient Position: Sitting)   Pulse 88   Temp 98.4 F (36.9 C) (Tympanic)   Resp 16   Ht _0  (1.651 m)   Wt 146 lb 12.8 oz (66.6 kg)   BMI 24.43 kg/m   Filed Weights   03/23/22 1405  Weight: 146 lb 12.8 oz (66.6 kg)    Physical Exam HENT:     Head: Normocephalic and atraumatic.     Mouth/Throat:     Pharynx: No oropharyngeal exudate.  Eyes:     Pupils: Pupils are equal, round, and reactive to light.  Cardiovascular:     Rate and Rhythm: Normal rate and regular rhythm.  Pulmonary:     Effort: No respiratory distress.     Breath sounds: No wheezing.     Comments: Decreased breath sounds bilaterally no wheeze or crackles. Abdominal:     General: Bowel sounds are normal. There is no distension.     Palpations: Abdomen is soft. There is no mass.     Tenderness: There is no abdominal tenderness. There is no guarding or rebound.  Musculoskeletal:        General: No tenderness. Normal range of motion.     Cervical back: Normal range of motion and  neck supple.  Skin:    General: Skin is warm.  Neurological:     Mental Status: He is alert and oriented to person, place, and time.  Psychiatric:        Mood and Affect: Affect normal.    LABORATORY DATA:  I have reviewed the data as listed    Component Value Date/Time   NA 137 03/23/2022 1355   NA 139 01/01/2016 1000   K 3.8 03/23/2022 1355   CL 106 03/23/2022 1355   CO2 25 03/23/2022 1355   GLUCOSE 107 (H) 03/23/2022 1355  BUN 12 03/23/2022 1355   BUN 13 01/01/2016 1000   CREATININE 1.24 03/23/2022 1355   CREATININE 1.20 01/09/2021 1226   CALCIUM 10.0 03/23/2022 1355   PROT 8.1 03/23/2022 1355   PROT 7.2 01/01/2016 1000   ALBUMIN 4.0 03/23/2022 1355   ALBUMIN 4.4 01/01/2016 1000   AST 20 03/23/2022 1355   ALT 27 03/23/2022 1355   ALKPHOS 99 03/23/2022 1355   BILITOT 1.0 03/23/2022 1355   BILITOT 0.4 01/01/2016 1000   GFRNONAA >60 03/23/2022 1355   GFRNONAA 62 01/09/2021 1226   GFRAA 72 01/09/2021 1226    No results found for: "SPEP", "UPEP"  Lab Results  Component Value Date   WBC 6.7 03/23/2022   NEUTROABS 3.6 03/23/2022   HGB 16.6 03/23/2022   HCT 49.9 03/23/2022   MCV 87.2 03/23/2022   PLT 239 03/23/2022      Chemistry      Component Value Date/Time   NA 137 03/23/2022 1355   NA 139 01/01/2016 1000   K 3.8 03/23/2022 1355   CL 106 03/23/2022 1355   CO2 25 03/23/2022 1355   BUN 12 03/23/2022 1355   BUN 13 01/01/2016 1000   CREATININE 1.24 03/23/2022 1355   CREATININE 1.20 01/09/2021 1226      Component Value Date/Time   CALCIUM 10.0 03/23/2022 1355   ALKPHOS 99 03/23/2022 1355   AST 20 03/23/2022 1355   ALT 27 03/23/2022 1355   BILITOT 1.0 03/23/2022 1355   BILITOT 0.4 01/01/2016 1000       RADIOGRAPHIC STUDIES: I have personally reviewed the radiological images as listed and agreed with the findings in the report. No results found.   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 JULY 15th, 2023-  Evidence recurrent FDG avid tumor within the area radiation change in the perihilar left lung;  Bilateral FDG avid hilar and mediastinal nodal metastasis;  New FDG avid posterior mediastinal adenopathy within the distal juxta-esophageal region, just above the GE junction; New left retrocrural and upper abdominal retroperitoneal nodal metastasis;  Mild nodularity of the left adrenal gland with corresponding increased tracer uptake is new from previous imaging. Suspicious for early metastasis; New small to moderate left pleural effusion.  See discussion below.   # Brain metastases - MAY 2022-s/p WBRT- MRI brain DEC 2022-likely. Radiation necrosis- currently s/p  Avastin #4 [finished FEB 2023]; MAY 8th, 2023- Three residual enhancing lesions (left temporal, left frontal, and midbrain) all show a few millimeters of increased enhancement. Minimal increased edema in the left temporal white matter. JULY 15th, 2023- PET Increased radiotracer uptake localizes to the known metastatic lesion in the left frontal lobe.  Patient not too keen on any therapy either Avastin or radiation.  Hold off MRI brain at this time.  See discussion below  # Elevated BG- PBF- 144-off dexamethasone [poorly controlled blood sugars while on steroids]. STABLE.     #Hypertension-currently well controlled off Avastin-continue  amlodipine 5 mg day.   # Bilateral PE & left lower extremity DVT:s/p IVC filter; currently on eliquis 2.5 g BID [5/13]-STABLE  # CKD stage III-creatinine 1.5 [GFR~51]- STABLE;   # Peripheral Neuropathy- STABLE;  DM;continue  gabapentin 200 mg qhs-STABLE;   # IV access: PIV  PROGNOSIS: Discussed the treatment options include oral targeted therapy, to which patient had extremely poor tolerance.  Discussed the option of chemotherapy again  suspect poor tolerance.  Given the incurable nature of the  disease /poor tolerance of therapy and in general less than 6 months of life expectancy-I introduced hospice philosophy to the patient and family.  Discussed that goal of care should be directed to symptom management rather than treating the underlying disease; and in the process help improve quality of life rather than quantity.  Discussed with hospice team would include-nurse, nurse aide, social worker and chaplain for help take care of patient with physical/emotional needs.  Patient's wife aware of hospice option.  They are in agreement.  # DISPOSITION:   # hospice referral  Re: lung cancer/brain metstases # follow up in 6-8 weeks--MD; labs cbc/cmp;portflush -Dr.B  # I reviewed the blood work- with the patient in detail; also reviewed the imaging independently [as summarized above]; and with the patient in detail.    # 40 minutes face-to-face with the patient discussing the above plan of care; more than 50% of time spent on prognosis/ natural history; counseling and coordination.   Cc; Dr.Sowles.    Orders Placed This Encounter  Procedures   Ambulatory referral to Hospice    Referral Priority:   Routine    Referral Type:   Consultation    Referral Reason:   Specialty Services Required    Requested Specialty:   Hospice Services    Number of Visits Requested:   1    All questions were answered. The patient knows to call the clinic with any problems, questions or concerns.      Cammie Sickle, MD 03/23/2022 9:47 PM

## 2022-03-23 NOTE — Progress Notes (Signed)
Decrease in appetite due to change in taste of food and the feeling of food in his mouth, 7 lb wt loss since 01/20/22.  Does have occasional nausea and does not have antiemetic medication at home.

## 2022-03-25 DIAGNOSIS — Z86711 Personal history of pulmonary embolism: Secondary | ICD-10-CM | POA: Diagnosis not present

## 2022-03-25 DIAGNOSIS — E1122 Type 2 diabetes mellitus with diabetic chronic kidney disease: Secondary | ICD-10-CM | POA: Diagnosis not present

## 2022-03-25 DIAGNOSIS — N183 Chronic kidney disease, stage 3 unspecified: Secondary | ICD-10-CM | POA: Diagnosis not present

## 2022-03-25 DIAGNOSIS — E1142 Type 2 diabetes mellitus with diabetic polyneuropathy: Secondary | ICD-10-CM | POA: Diagnosis not present

## 2022-03-25 DIAGNOSIS — I129 Hypertensive chronic kidney disease with stage 1 through stage 4 chronic kidney disease, or unspecified chronic kidney disease: Secondary | ICD-10-CM | POA: Diagnosis not present

## 2022-03-25 DIAGNOSIS — L598 Other specified disorders of the skin and subcutaneous tissue related to radiation: Secondary | ICD-10-CM | POA: Diagnosis not present

## 2022-03-25 DIAGNOSIS — Z8673 Personal history of transient ischemic attack (TIA), and cerebral infarction without residual deficits: Secondary | ICD-10-CM | POA: Diagnosis not present

## 2022-03-25 DIAGNOSIS — Z6824 Body mass index (BMI) 24.0-24.9, adult: Secondary | ICD-10-CM | POA: Diagnosis not present

## 2022-03-25 DIAGNOSIS — C7801 Secondary malignant neoplasm of right lung: Secondary | ICD-10-CM | POA: Diagnosis not present

## 2022-03-25 DIAGNOSIS — N400 Enlarged prostate without lower urinary tract symptoms: Secondary | ICD-10-CM | POA: Diagnosis not present

## 2022-03-25 DIAGNOSIS — C7931 Secondary malignant neoplasm of brain: Secondary | ICD-10-CM | POA: Diagnosis not present

## 2022-03-25 DIAGNOSIS — R634 Abnormal weight loss: Secondary | ICD-10-CM | POA: Diagnosis not present

## 2022-03-25 DIAGNOSIS — C3412 Malignant neoplasm of upper lobe, left bronchus or lung: Secondary | ICD-10-CM | POA: Diagnosis not present

## 2022-03-25 DIAGNOSIS — E785 Hyperlipidemia, unspecified: Secondary | ICD-10-CM | POA: Diagnosis not present

## 2022-03-25 DIAGNOSIS — C7951 Secondary malignant neoplasm of bone: Secondary | ICD-10-CM | POA: Diagnosis not present

## 2022-03-25 DIAGNOSIS — G40909 Epilepsy, unspecified, not intractable, without status epilepticus: Secondary | ICD-10-CM | POA: Diagnosis not present

## 2022-03-26 ENCOUNTER — Other Ambulatory Visit: Payer: Self-pay | Admitting: Hospice and Palliative Medicine

## 2022-03-26 DIAGNOSIS — C7801 Secondary malignant neoplasm of right lung: Secondary | ICD-10-CM | POA: Diagnosis not present

## 2022-03-26 DIAGNOSIS — R634 Abnormal weight loss: Secondary | ICD-10-CM | POA: Diagnosis not present

## 2022-03-26 DIAGNOSIS — L598 Other specified disorders of the skin and subcutaneous tissue related to radiation: Secondary | ICD-10-CM | POA: Diagnosis not present

## 2022-03-26 DIAGNOSIS — C7951 Secondary malignant neoplasm of bone: Secondary | ICD-10-CM | POA: Diagnosis not present

## 2022-03-26 DIAGNOSIS — C7931 Secondary malignant neoplasm of brain: Secondary | ICD-10-CM | POA: Diagnosis not present

## 2022-03-26 DIAGNOSIS — C3412 Malignant neoplasm of upper lobe, left bronchus or lung: Secondary | ICD-10-CM | POA: Diagnosis not present

## 2022-03-26 MED ORDER — LORAZEPAM 0.5 MG PO TABS: 0.5000 mg | ORAL_TABLET | ORAL | 0 refills | Status: AC | PRN

## 2022-03-26 MED ORDER — MORPHINE SULFATE (CONCENTRATE) 10 MG /0.5 ML PO SOLN: 5.0000 mg | ORAL | 0 refills | Status: AC | PRN

## 2022-03-26 NOTE — Progress Notes (Signed)
Hospice nurse, April, called and requested morphine and lorazepam for comfort. Rx sent to pharmacy. She also wanted to start patient on o2. Orders given.

## 2022-03-30 DIAGNOSIS — C7951 Secondary malignant neoplasm of bone: Secondary | ICD-10-CM | POA: Diagnosis not present

## 2022-03-30 DIAGNOSIS — R634 Abnormal weight loss: Secondary | ICD-10-CM | POA: Diagnosis not present

## 2022-03-30 DIAGNOSIS — L598 Other specified disorders of the skin and subcutaneous tissue related to radiation: Secondary | ICD-10-CM | POA: Diagnosis not present

## 2022-03-30 DIAGNOSIS — C7931 Secondary malignant neoplasm of brain: Secondary | ICD-10-CM | POA: Diagnosis not present

## 2022-03-30 DIAGNOSIS — C7801 Secondary malignant neoplasm of right lung: Secondary | ICD-10-CM | POA: Diagnosis not present

## 2022-03-30 DIAGNOSIS — C3412 Malignant neoplasm of upper lobe, left bronchus or lung: Secondary | ICD-10-CM | POA: Diagnosis not present

## 2022-04-02 ENCOUNTER — Other Ambulatory Visit: Payer: Self-pay | Admitting: Internal Medicine

## 2022-04-02 DIAGNOSIS — I82403 Acute embolism and thrombosis of unspecified deep veins of lower extremity, bilateral: Secondary | ICD-10-CM

## 2022-04-03 ENCOUNTER — Encounter: Payer: Self-pay | Admitting: Internal Medicine

## 2022-04-03 DIAGNOSIS — C7801 Secondary malignant neoplasm of right lung: Secondary | ICD-10-CM | POA: Diagnosis not present

## 2022-04-03 DIAGNOSIS — C7931 Secondary malignant neoplasm of brain: Secondary | ICD-10-CM | POA: Diagnosis not present

## 2022-04-03 DIAGNOSIS — L598 Other specified disorders of the skin and subcutaneous tissue related to radiation: Secondary | ICD-10-CM | POA: Diagnosis not present

## 2022-04-03 DIAGNOSIS — C7951 Secondary malignant neoplasm of bone: Secondary | ICD-10-CM | POA: Diagnosis not present

## 2022-04-03 DIAGNOSIS — C3412 Malignant neoplasm of upper lobe, left bronchus or lung: Secondary | ICD-10-CM | POA: Diagnosis not present

## 2022-04-03 DIAGNOSIS — R634 Abnormal weight loss: Secondary | ICD-10-CM | POA: Diagnosis not present

## 2022-04-07 DIAGNOSIS — Z86711 Personal history of pulmonary embolism: Secondary | ICD-10-CM | POA: Diagnosis not present

## 2022-04-07 DIAGNOSIS — E1142 Type 2 diabetes mellitus with diabetic polyneuropathy: Secondary | ICD-10-CM | POA: Diagnosis not present

## 2022-04-07 DIAGNOSIS — C3412 Malignant neoplasm of upper lobe, left bronchus or lung: Secondary | ICD-10-CM | POA: Diagnosis not present

## 2022-04-07 DIAGNOSIS — C7931 Secondary malignant neoplasm of brain: Secondary | ICD-10-CM | POA: Diagnosis not present

## 2022-04-07 DIAGNOSIS — R634 Abnormal weight loss: Secondary | ICD-10-CM | POA: Diagnosis not present

## 2022-04-07 DIAGNOSIS — Z6824 Body mass index (BMI) 24.0-24.9, adult: Secondary | ICD-10-CM | POA: Diagnosis not present

## 2022-04-07 DIAGNOSIS — E785 Hyperlipidemia, unspecified: Secondary | ICD-10-CM | POA: Diagnosis not present

## 2022-04-07 DIAGNOSIS — E1122 Type 2 diabetes mellitus with diabetic chronic kidney disease: Secondary | ICD-10-CM | POA: Diagnosis not present

## 2022-04-07 DIAGNOSIS — N183 Chronic kidney disease, stage 3 unspecified: Secondary | ICD-10-CM | POA: Diagnosis not present

## 2022-04-07 DIAGNOSIS — Z8673 Personal history of transient ischemic attack (TIA), and cerebral infarction without residual deficits: Secondary | ICD-10-CM | POA: Diagnosis not present

## 2022-04-07 DIAGNOSIS — G40909 Epilepsy, unspecified, not intractable, without status epilepticus: Secondary | ICD-10-CM | POA: Diagnosis not present

## 2022-04-07 DIAGNOSIS — I129 Hypertensive chronic kidney disease with stage 1 through stage 4 chronic kidney disease, or unspecified chronic kidney disease: Secondary | ICD-10-CM | POA: Diagnosis not present

## 2022-04-07 DIAGNOSIS — N400 Enlarged prostate without lower urinary tract symptoms: Secondary | ICD-10-CM | POA: Diagnosis not present

## 2022-04-07 DIAGNOSIS — C7951 Secondary malignant neoplasm of bone: Secondary | ICD-10-CM | POA: Diagnosis not present

## 2022-04-07 DIAGNOSIS — L598 Other specified disorders of the skin and subcutaneous tissue related to radiation: Secondary | ICD-10-CM | POA: Diagnosis not present

## 2022-04-07 DIAGNOSIS — C7801 Secondary malignant neoplasm of right lung: Secondary | ICD-10-CM | POA: Diagnosis not present

## 2022-04-08 DIAGNOSIS — R634 Abnormal weight loss: Secondary | ICD-10-CM | POA: Diagnosis not present

## 2022-04-08 DIAGNOSIS — C7801 Secondary malignant neoplasm of right lung: Secondary | ICD-10-CM | POA: Diagnosis not present

## 2022-04-08 DIAGNOSIS — C7931 Secondary malignant neoplasm of brain: Secondary | ICD-10-CM | POA: Diagnosis not present

## 2022-04-08 DIAGNOSIS — C7951 Secondary malignant neoplasm of bone: Secondary | ICD-10-CM | POA: Diagnosis not present

## 2022-04-08 DIAGNOSIS — L598 Other specified disorders of the skin and subcutaneous tissue related to radiation: Secondary | ICD-10-CM | POA: Diagnosis not present

## 2022-04-08 DIAGNOSIS — C3412 Malignant neoplasm of upper lobe, left bronchus or lung: Secondary | ICD-10-CM | POA: Diagnosis not present

## 2022-04-09 DIAGNOSIS — C7801 Secondary malignant neoplasm of right lung: Secondary | ICD-10-CM | POA: Diagnosis not present

## 2022-04-09 DIAGNOSIS — C7951 Secondary malignant neoplasm of bone: Secondary | ICD-10-CM | POA: Diagnosis not present

## 2022-04-09 DIAGNOSIS — C7931 Secondary malignant neoplasm of brain: Secondary | ICD-10-CM | POA: Diagnosis not present

## 2022-04-09 DIAGNOSIS — L598 Other specified disorders of the skin and subcutaneous tissue related to radiation: Secondary | ICD-10-CM | POA: Diagnosis not present

## 2022-04-09 DIAGNOSIS — R634 Abnormal weight loss: Secondary | ICD-10-CM | POA: Diagnosis not present

## 2022-04-09 DIAGNOSIS — C3412 Malignant neoplasm of upper lobe, left bronchus or lung: Secondary | ICD-10-CM | POA: Diagnosis not present

## 2022-04-15 DIAGNOSIS — L598 Other specified disorders of the skin and subcutaneous tissue related to radiation: Secondary | ICD-10-CM | POA: Diagnosis not present

## 2022-04-15 DIAGNOSIS — C3412 Malignant neoplasm of upper lobe, left bronchus or lung: Secondary | ICD-10-CM | POA: Diagnosis not present

## 2022-04-15 DIAGNOSIS — C7931 Secondary malignant neoplasm of brain: Secondary | ICD-10-CM | POA: Diagnosis not present

## 2022-04-15 DIAGNOSIS — C7951 Secondary malignant neoplasm of bone: Secondary | ICD-10-CM | POA: Diagnosis not present

## 2022-04-15 DIAGNOSIS — R634 Abnormal weight loss: Secondary | ICD-10-CM | POA: Diagnosis not present

## 2022-04-15 DIAGNOSIS — C7801 Secondary malignant neoplasm of right lung: Secondary | ICD-10-CM | POA: Diagnosis not present

## 2022-04-24 DIAGNOSIS — C7931 Secondary malignant neoplasm of brain: Secondary | ICD-10-CM | POA: Diagnosis not present

## 2022-04-24 DIAGNOSIS — C7801 Secondary malignant neoplasm of right lung: Secondary | ICD-10-CM | POA: Diagnosis not present

## 2022-04-24 DIAGNOSIS — L598 Other specified disorders of the skin and subcutaneous tissue related to radiation: Secondary | ICD-10-CM | POA: Diagnosis not present

## 2022-04-24 DIAGNOSIS — C7951 Secondary malignant neoplasm of bone: Secondary | ICD-10-CM | POA: Diagnosis not present

## 2022-04-24 DIAGNOSIS — C3412 Malignant neoplasm of upper lobe, left bronchus or lung: Secondary | ICD-10-CM | POA: Diagnosis not present

## 2022-04-24 DIAGNOSIS — R634 Abnormal weight loss: Secondary | ICD-10-CM | POA: Diagnosis not present

## 2022-04-27 ENCOUNTER — Telehealth: Payer: Self-pay

## 2022-04-27 NOTE — Telephone Encounter (Signed)
Need to ask Hinton Dyer questions on fmla forms.  Left message to give [place] a call.

## 2022-04-29 DIAGNOSIS — C7801 Secondary malignant neoplasm of right lung: Secondary | ICD-10-CM | POA: Diagnosis not present

## 2022-04-29 DIAGNOSIS — R634 Abnormal weight loss: Secondary | ICD-10-CM | POA: Diagnosis not present

## 2022-04-29 DIAGNOSIS — C3412 Malignant neoplasm of upper lobe, left bronchus or lung: Secondary | ICD-10-CM | POA: Diagnosis not present

## 2022-04-29 DIAGNOSIS — C7951 Secondary malignant neoplasm of bone: Secondary | ICD-10-CM | POA: Diagnosis not present

## 2022-04-29 DIAGNOSIS — L598 Other specified disorders of the skin and subcutaneous tissue related to radiation: Secondary | ICD-10-CM | POA: Diagnosis not present

## 2022-04-29 DIAGNOSIS — C7931 Secondary malignant neoplasm of brain: Secondary | ICD-10-CM | POA: Diagnosis not present

## 2022-04-29 NOTE — Telephone Encounter (Signed)
Sent mychart message as well.   04/30/22 left message for Annabelle Harman to call us regarding the fmla forms.

## 2022-04-30 ENCOUNTER — Ambulatory Visit: Payer: Medicare Other

## 2022-05-04 ENCOUNTER — Other Ambulatory Visit: Payer: Self-pay

## 2022-05-04 DIAGNOSIS — C3412 Malignant neoplasm of upper lobe, left bronchus or lung: Secondary | ICD-10-CM

## 2022-05-05 ENCOUNTER — Inpatient Hospital Stay: Attending: Internal Medicine

## 2022-05-05 ENCOUNTER — Inpatient Hospital Stay (HOSPITAL_BASED_OUTPATIENT_CLINIC_OR_DEPARTMENT_OTHER): Admitting: Internal Medicine

## 2022-05-05 ENCOUNTER — Encounter: Payer: Self-pay | Admitting: Internal Medicine

## 2022-05-05 DIAGNOSIS — Z8719 Personal history of other diseases of the digestive system: Secondary | ICD-10-CM | POA: Insufficient documentation

## 2022-05-05 DIAGNOSIS — E1122 Type 2 diabetes mellitus with diabetic chronic kidney disease: Secondary | ICD-10-CM | POA: Diagnosis not present

## 2022-05-05 DIAGNOSIS — J90 Pleural effusion, not elsewhere classified: Secondary | ICD-10-CM | POA: Insufficient documentation

## 2022-05-05 DIAGNOSIS — C7951 Secondary malignant neoplasm of bone: Secondary | ICD-10-CM | POA: Insufficient documentation

## 2022-05-05 DIAGNOSIS — Z86711 Personal history of pulmonary embolism: Secondary | ICD-10-CM | POA: Diagnosis not present

## 2022-05-05 DIAGNOSIS — Z9079 Acquired absence of other genital organ(s): Secondary | ICD-10-CM | POA: Insufficient documentation

## 2022-05-05 DIAGNOSIS — C771 Secondary and unspecified malignant neoplasm of intrathoracic lymph nodes: Secondary | ICD-10-CM | POA: Insufficient documentation

## 2022-05-05 DIAGNOSIS — Z8249 Family history of ischemic heart disease and other diseases of the circulatory system: Secondary | ICD-10-CM | POA: Diagnosis not present

## 2022-05-05 DIAGNOSIS — R5383 Other fatigue: Secondary | ICD-10-CM | POA: Insufficient documentation

## 2022-05-05 DIAGNOSIS — Z833 Family history of diabetes mellitus: Secondary | ICD-10-CM | POA: Insufficient documentation

## 2022-05-05 DIAGNOSIS — Z8673 Personal history of transient ischemic attack (TIA), and cerebral infarction without residual deficits: Secondary | ICD-10-CM | POA: Diagnosis not present

## 2022-05-05 DIAGNOSIS — Z818 Family history of other mental and behavioral disorders: Secondary | ICD-10-CM | POA: Insufficient documentation

## 2022-05-05 DIAGNOSIS — C7931 Secondary malignant neoplasm of brain: Secondary | ICD-10-CM | POA: Insufficient documentation

## 2022-05-05 DIAGNOSIS — C3412 Malignant neoplasm of upper lobe, left bronchus or lung: Secondary | ICD-10-CM | POA: Diagnosis not present

## 2022-05-05 DIAGNOSIS — R413 Other amnesia: Secondary | ICD-10-CM | POA: Insufficient documentation

## 2022-05-05 DIAGNOSIS — Z7901 Long term (current) use of anticoagulants: Secondary | ICD-10-CM | POA: Insufficient documentation

## 2022-05-05 DIAGNOSIS — Z79899 Other long term (current) drug therapy: Secondary | ICD-10-CM | POA: Diagnosis not present

## 2022-05-05 DIAGNOSIS — N183 Chronic kidney disease, stage 3 unspecified: Secondary | ICD-10-CM | POA: Diagnosis not present

## 2022-05-05 DIAGNOSIS — C7800 Secondary malignant neoplasm of unspecified lung: Secondary | ICD-10-CM | POA: Diagnosis not present

## 2022-05-05 DIAGNOSIS — Z8042 Family history of malignant neoplasm of prostate: Secondary | ICD-10-CM | POA: Diagnosis not present

## 2022-05-05 DIAGNOSIS — M2550 Pain in unspecified joint: Secondary | ICD-10-CM | POA: Insufficient documentation

## 2022-05-05 DIAGNOSIS — R634 Abnormal weight loss: Secondary | ICD-10-CM | POA: Diagnosis not present

## 2022-05-05 LAB — COMPREHENSIVE METABOLIC PANEL
ALT: 35 U/L (ref 0–44)
AST: 27 U/L (ref 15–41)
Albumin: 3.7 g/dL (ref 3.5–5.0)
Alkaline Phosphatase: 108 U/L (ref 38–126)
Anion gap: 7 (ref 5–15)
BUN: 17 mg/dL (ref 8–23)
CO2: 25 mmol/L (ref 22–32)
Calcium: 9.4 mg/dL (ref 8.9–10.3)
Chloride: 105 mmol/L (ref 98–111)
Creatinine, Ser: 1.08 mg/dL (ref 0.61–1.24)
GFR, Estimated: >60 mL/min (ref >60)
Glucose, Bld: 164 mg/dL — ABNORMAL HIGH (ref 70–99)
Potassium: 4.1 mmol/L (ref 3.5–5.1)
Sodium: 137 mmol/L (ref 135–145)
Total Bilirubin: 0.7 mg/dL (ref 0.3–1.2)
Total Protein: 8.3 g/dL — ABNORMAL HIGH (ref 6.5–8.1)

## 2022-05-05 LAB — CBC WITH DIFFERENTIAL/PLATELET
Abs Immature Granulocytes: 0.02 10*3/uL (ref 0.00–0.07)
Basophils Absolute: 0 10*3/uL (ref 0.0–0.1)
Basophils Relative: 1 %
Eosinophils Absolute: 0.1 10*3/uL (ref 0.0–0.5)
Eosinophils Relative: 2 %
HCT: 49.4 % (ref 39.0–52.0)
Hemoglobin: 16.6 g/dL (ref 13.0–17.0)
Immature Granulocytes: 0 %
Lymphocytes Relative: 24 %
Lymphs Abs: 1.6 10*3/uL (ref 0.7–4.0)
MCH: 28.7 pg (ref 26.0–34.0)
MCHC: 33.6 g/dL (ref 30.0–36.0)
MCV: 85.5 fL (ref 80.0–100.0)
Monocytes Absolute: 0.7 10*3/uL (ref 0.1–1.0)
Monocytes Relative: 11 %
Neutro Abs: 4.2 10*3/uL (ref 1.7–7.7)
Neutrophils Relative %: 62 %
Platelets: 267 10*3/uL (ref 150–400)
RBC: 5.78 MIL/uL (ref 4.22–5.81)
RDW: 13.6 % (ref 11.5–15.5)
WBC: 6.7 10*3/uL (ref 4.0–10.5)
nRBC: 0 % (ref 0.0–0.2)

## 2022-05-05 NOTE — Assessment & Plan Note (Addendum)
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 JULY 15th, 2023-  Evidence recurrent FDG avid tumor within the area radiation change in the perihilar left lung;  Bilateral FDG avid hilar and mediastinal nodal metastasis;  New FDG avid posterior mediastinal adenopathy within the distal juxta-esophageal region, just above the GE junction; New left retrocrural and upper abdominal retroperitoneal nodal metastasis;  Mild nodularity of the left adrenal gland with corresponding increased tracer uptake is new from previous imaging. Suspicious for early metastasis; New small to moderate left pleural effusion.  See discussion below.   # Brain metastases - MAY 2022-s/p WBRT- MRI brain DEC 2022-likely. Radiation necrosis- currently s/p  Avastin #4 [finished FEB 2023]; MAY 8th, 2023- Three residual enhancing lesions (left temporal, left frontal, and midbrain) all show a few millimeters of increased enhancement. Minimal increased edema in the left temporal white matter. JULY 15th, 2023- PET Increased radiotracer uptake localizes to the known metastatic lesion in the left frontal lobe.  Patient not too keen on any therapy either Avastin or radiation.  Hold off MRI brain at this time.  # Elevated BG- PBF- 144-off dexamethasone [poorly controlled blood sugars while on steroids]. STABLE.   #Hypertension-currently well controlled off Avastin-continue  amlodipine 5 mg day.   # Bilateral PE & left lower extremity DVT:s/p IVC filter; currently on eliquis 2.5 g BID [5/13]-STABLE  # CKD stage III-creatinine 1.5 [GFR~51]- STABLE;   # Peripheral Neuropathy- STABLE;  DM;continue  gabapentin 200 mg qhs-STABLE;   # PROGNOSIS: I had a long discussion with patient and wife regarding overall poor prognosis given progressive disease/declining performance status and in general reluctance with any therapies.  Patient is currently in hospice.  Again reviewed  that would be best served under home hospice.  Patient will reach out to [place] as needed.  # IV access: PIV  # DISPOSITION:   # follow up as needed- Dr.B  Cc; Dr.Sowles.

## 2022-05-05 NOTE — Progress Notes (Signed)
Decrease in appetite with 7 lb wt loss.  Not able to eat much due to nausea.

## 2022-05-05 NOTE — Progress Notes (Signed)
[place] OFFICE PROGRESS NOTE  Patient Care Team: [name], [name], [name], [name], [facility], [place] (Pharmacist) Ventura Sellers, MD as Consulting Physician (Psychiatry) Billey Co, MD as Consulting Physician (Urology)   Cancer Staging  No matching staging information was found for the patient.   Oncology History Overview Note  # OCT 2017- ADENO CA LUNG; STAGE IV [; LUL; bil supraclavicular LN; Left neck LN Bx]; ROS-1 MUTATED; s/p Carbo-alimta x1[oct 2017]  # NOV 1st 2017- XALKORI 250 mg BID; JAN 15th CT- PR;  # AUG 27th Chemo-RT to persistent LUL/mediastinal LN [s/p carbo-taxol- with RT; finished Sep 22nd 2018];   # July 14th 2020- multiple metastatic lesions of the brain [July 30 whole brain radiation finished April 21, 2019]; bone scan skull metastases /posterior left ninth rib metastases; April 04, 2019 stopped crizotinib;  #April 25, 2019-start Entrectinib 600 mg once a day; STopped in NOV 2020 [dizziness]; NOV 2020-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  # DEC 1st 2020- start avastin q 2w; x6; MRI October 30, 2019-significantly improved left temporal lesion subcentimeter for stable lesions.;  [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   #June 2022-stopped lorlatinib [severe delirium/aggression]; currently on surveillance.  # DEC  2022-MRI brain- ?  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  MRI February 6, 23-significant response  In the interim patient also underwent-HOLEP on May 6.   # LLE DVT/bil PE/Multiple strokes [? 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    # MRI brain- multiple infarcts [2d echo/bubble study-NEG's/p Neurology eval]  ------------------------------------------------------------    # # s/p TURP [Sep 2017; Dr.Cope] DEC 26th CT- distended bladder;  # Elevated PSA-  FEB 2021- 12;  S/p Bx- Negative; follow with urology/Dr.Stoioff   # MOLECULAR TESTING- ROS-1 POSITIVE; ALK/EGFR-NEG; PDL-1 EXPRESSION- 90%** [HIGH]  # DEC 15th 2020- PALLIATIVE CARE -----------------------------------------------------------------    DIAGNOSIS: 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  Accompanied by his wife.  Melvin Willis 71 y.o.  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 radiation necrosis] currently s/p Avastin [Feb 2023] is here for follow-up.  In the interim patient has been admitted to hospice.  His appetite is poor.  He has lost weight.  However no leg swelling.  No headaches.  No nausea or vomiting. .  Review of Systems  Constitutional:  Positive for malaise/fatigue. Negative for chills, diaphoresis, fever and weight loss.  HENT:  Negative for nosebleeds and sore throat.   Eyes:  Negative for double vision.  Respiratory:  Negative for cough, hemoptysis, sputum production, shortness of breath and  wheezing.   Cardiovascular:  Negative for chest pain (Left upper chest wall pain chronic), palpitations and orthopnea.  Gastrointestinal:  Negative for abdominal pain, blood in stool, constipation, diarrhea, heartburn and melena.  Genitourinary:  Negative for dysuria.  Musculoskeletal:  Positive for joint pain. Negative for back pain.  Skin: Negative.  Negative for itching and rash.  Neurological:  Positive for tingling. Negative for focal weakness and weakness.  Endo/Heme/Allergies:  Does not bruise/bleed easily.  Psychiatric/Behavioral:  Positive for memory loss. Negative for depression. The patient is not nervous/anxious and does not have insomnia.     PAST MEDICAL HISTORY :  Past Medical History:  Diagnosis Date   Abnormal prostate specific antigen 08/08/2012   Adiposity 04/16/2015   Chronic kidney disease (CKD), stage III (moderate) ([place]) 11/27/2016   CVA (cerebral vascular accident) ([place]) 06/17/2016   Diabetes mellitus without complication ([place])    Diverticulosis of sigmoid colon 04/16/2015   Dyslipidemia 03/18/2015   Hemorrhoids, internal 04/16/2015   Hypercholesteremia 04/16/2015   Hyperlipidemia    Hypertension    Primary cancer of left upper lobe of lung ([place])    Pulmonary embolism ([place])    Wears dentures    partial upper    PAST SURGICAL HISTORY :   Past Surgical History:  Procedure Laterality Date   COLONOSCOPY     COLONOSCOPY WITH PROPOFOL N/A 05/06/2015   Procedure: COLONOSCOPY WITH PROPOFOL;  Surgeon: Lucilla Lame, MD;  Location: [place];  Service: Endoscopy;  Laterality: N/A;  ASCENDING COLON POLYPS X 2 TERMINAL ILEUM BIOPSY RANDOM COLON BX. TRANSVERSE COLON POLYP SIGMOID COLON POLYP   ESOPHAGOGASTRODUODENOSCOPY (EGD) WITH PROPOFOL N/A 05/06/2015   Procedure: ESOPHAGOGASTRODUODENOSCOPY (EGD) WITH PROPOFOL;  Surgeon: Lucilla Lame, MD;  Location: [place];  Service: Endoscopy;  Laterality: N/A;  GASTRIC BIOPSY X1   HOLEP-LASER ENUCLEATION OF THE  PROSTATE WITH MORCELLATION N/A 01/10/2021   Procedure: HOLEP-LASER ENUCLEATION OF THE PROSTATE WITH MORCELLATION;  Surgeon: Billey Co, MD;  Location: [facility];  Service: Urology;  Laterality: N/A;   IVC FILTER INSERTION N/A 12/29/2020   Procedure: IVC FILTER INSERTION;  Surgeon: Dwana Curd, MD;  Location: [place] [facility];  Service: Cardiovascular;  Laterality: N/A;   KIDNEY STONE SURGERY  2017    FAMILY HISTORY :   Family History  Problem Relation Age of Onset   Diabetes Mother    Diabetes Father    CAD Father    Dementia Father    Diabetes Sister    Cancer Maternal Uncle        Prostate   Cancer Cousin        prostate    SOCIAL HISTORY:   Social History   Tobacco Use   Smoking status: Never   Smokeless tobacco: Never   Tobacco comments:    Smoking cessation materials not required  Vaping Use   Vaping Use: Never used  Substance Use Topics   Alcohol use: No    Alcohol/week: 0.0 standard drinks of alcohol   Drug use: No    ALLERGIES:  has No Known Allergies.  MEDICATIONS:  Current Outpatient Medications  Medication Sig Dispense Refill   amLODipine (NORVASC) 5 MG tablet Take 1 tablet by mouth once daily 30 tablet 0   atorvastatin (LIPITOR) 40 MG tablet Take 1 tablet by mouth once daily 90 tablet 0   blood glucose meter kit and supplies Dispense based on patient and insurance preference (  Accuchek Aviva Plus, Accuchek Nano). Use up to four times daily as directed. (FOR ICD-10 E10.9, E11.9). 1 each 0   ELIQUIS 2.5 MG TABS tablet Take 1 tablet by mouth twice daily 60 tablet 0   Insulin Pen Needle (NOVOFINE PEN NEEDLE) 32G X 6 MM MISC 1 each by Does not apply route daily. 100 each 2   insulin regular human CONCENTRATED (HUMULIN R U-500 KWIKPEN) 500 UNIT/ML KwikPen Inject 1-10 Units into the skin 3 (three) times daily with meals. Pre-meal insulin :  Check fsbs prior to meals  Hold if below 130 If glucose 130- 140 give 2 units If glucose 140 to 180 give 4  units  If glucose 180 to 250 give 6 units If glucose above 250 give 8 units 15 mL 0   levETIRAcetam (KEPPRA) 500 MG tablet Take 1 tablet (500 mg total) by mouth 2 (two) times daily. 60 tablet 3   LORazepam (ATIVAN) 0.5 MG tablet Take 1 tablet (0.5 mg total) by mouth every 4 (four) hours as needed for anxiety. 30 tablet 0   Morphine Sulfate (MORPHINE CONCENTRATE) 10 mg / 0.5 ml concentrated solution Take 0.25 mLs (5 mg total) by mouth every 2 (two) hours as needed for severe pain or shortness of breath. 30 mL 0   ondansetron (ZOFRAN) 8 MG tablet Take by mouth every 8 (eight) hours as needed for nausea or vomiting.     SOLIQUA 100-33 UNT-MCG/ML SOPN INJECT 15-30 UNITS  SUBCUTANEOUSLY ONCE DAILY 15 mL 0   dexamethasone (DECADRON) 1 MG tablet Take 1 tablet (1 mg total) by mouth daily. (Patient not taking: Reported on 01/20/2022) 60 tablet 1   gabapentin (NEURONTIN) 300 MG capsule Take 1 capsule by mouth at bedtime (Patient not taking: Reported on 05/05/2022) 90 capsule 2   lorlatinib (LORBRENA) 25 MG tablet Take 2 tablets (50 mg total) by mouth daily. (Patient not taking: Reported on 12/22/2021) 60 tablet 1   No current facility-administered medications for this visit.    PHYSICAL EXAMINATION: ECOG PERFORMANCE STATUS: 0 - Asymptomatic  BP 122/87 (BP Location: Left Arm, Patient Position: Sitting)   Pulse 94   Temp (!) 96.4 F (35.8 C) (Tympanic)   Resp 16   Wt 139 lb (63 kg)   SpO2 97%   BMI 23.13 kg/m   Filed Weights   05/05/22 1400  Weight: 139 lb (63 kg)    Physical Exam HENT:     Head: Normocephalic and atraumatic.     Mouth/Throat:     Pharynx: No oropharyngeal exudate.  Eyes:     Pupils: Pupils are equal, round, and reactive to light.  Cardiovascular:     Rate and Rhythm: Normal rate and regular rhythm.  Pulmonary:     Effort: No respiratory distress.     Breath sounds: No wheezing.     Comments: Decreased breath sounds bilaterally no wheeze or crackles. Abdominal:      General: Bowel sounds are normal. There is no distension.     Palpations: Abdomen is soft. There is no mass.     Tenderness: There is no abdominal tenderness. There is no guarding or rebound.  Musculoskeletal:        General: No tenderness. Normal range of motion.     Cervical back: Normal range of motion and neck supple.  Skin:    General: Skin is warm.  Neurological:     Mental Status: He is alert and oriented to person, place, and time.  Psychiatric:  Mood and Affect: Affect normal.    LABORATORY DATA:  I have reviewed the data as listed    Component Value Date/Time   NA 137 05/05/2022 1412   NA 139 01/01/2016 1000   K 4.1 05/05/2022 1412   CL 105 05/05/2022 1412   CO2 25 05/05/2022 1412   GLUCOSE 164 (H) 05/05/2022 1412   BUN 17 05/05/2022 1412   BUN 13 01/01/2016 1000   CREATININE 1.08 05/05/2022 1412   CREATININE 1.20 01/09/2021 1226   CALCIUM 9.4 05/05/2022 1412   PROT 8.3 (H) 05/05/2022 1412   PROT 7.2 01/01/2016 1000   ALBUMIN 3.7 05/05/2022 1412   ALBUMIN 4.4 01/01/2016 1000   AST 27 05/05/2022 1412   ALT 35 05/05/2022 1412   ALKPHOS 108 05/05/2022 1412   BILITOT 0.7 05/05/2022 1412   BILITOT 0.4 01/01/2016 1000   GFRNONAA >60 05/05/2022 1412   GFRNONAA 62 01/09/2021 1226   GFRAA 72 01/09/2021 1226    No results found for: "SPEP", "UPEP"  Lab Results  Component Value Date   WBC 6.7 05/05/2022   NEUTROABS 4.2 05/05/2022   HGB 16.6 05/05/2022   HCT 49.4 05/05/2022   MCV 85.5 05/05/2022   PLT 267 05/05/2022      Chemistry      Component Value Date/Time   NA 137 05/05/2022 1412   NA 139 01/01/2016 1000   K 4.1 05/05/2022 1412   CL 105 05/05/2022 1412   CO2 25 05/05/2022 1412   BUN 17 05/05/2022 1412   BUN 13 01/01/2016 1000   CREATININE 1.08 05/05/2022 1412   CREATININE 1.20 01/09/2021 1226      Component Value Date/Time   CALCIUM 9.4 05/05/2022 1412   ALKPHOS 108 05/05/2022 1412   AST 27 05/05/2022 1412   ALT 35 05/05/2022 1412    BILITOT 0.7 05/05/2022 1412   BILITOT 0.4 01/01/2016 1000       RADIOGRAPHIC STUDIES: I have personally reviewed the radiological images as listed and agreed with the findings in the report. No results found.   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 JULY 15th, 2023-  Evidence recurrent FDG avid tumor within the area radiation change in the perihilar left lung;  Bilateral FDG avid hilar and mediastinal nodal metastasis;  New FDG avid posterior mediastinal adenopathy within the distal juxta-esophageal region, just above the GE junction; New left retrocrural and upper abdominal retroperitoneal nodal metastasis;  Mild nodularity of the left adrenal gland with corresponding increased tracer uptake is new from previous imaging. Suspicious for early metastasis; New small to moderate left pleural effusion.  See discussion below.   # Brain metastases - MAY 2022-s/p WBRT- MRI brain DEC 2022-likely. Radiation necrosis- currently s/p  Avastin #4 [finished FEB 2023]; MAY 8th, 2023- Three residual enhancing lesions (left temporal, left frontal, and midbrain) all show a few millimeters of increased enhancement. Minimal increased edema in the left temporal white matter. JULY 15th, 2023- PET Increased radiotracer uptake localizes to the known metastatic lesion in the left frontal lobe.  Patient not too keen on any therapy either Avastin or radiation.  Hold off MRI brain at this time.  # Elevated BG- PBF- 144-off dexamethasone [poorly controlled blood sugars while on steroids]. STABLE.     #Hypertension-currently well controlled off Avastin-continue  amlodipine 5 mg day.   # Bilateral PE & left lower extremity DVT:s/p IVC  filter; currently on eliquis 2.5 g BID [5/13]-STABLE  # CKD stage III-creatinine 1.5 [GFR~51]- STABLE;   # Peripheral Neuropathy- STABLE;   DM;continue  gabapentin 200 mg qhs-STABLE;   # PROGNOSIS: I had a long discussion with patient and wife regarding overall poor prognosis given progressive disease/declining performance status and in general reluctance with any therapies.  Patient is currently in hospice.  Again reviewed that would be best served under home hospice.  Patient will reach out to [place] as needed.  # IV access: PIV  # DISPOSITION:   # follow up as needed- Dr.B  Cc; Dr.Sowles.    No orders of the defined types were placed in this encounter.   All questions were answered. The patient knows to call the clinic with any problems, questions or concerns.      Cammie Sickle, MD 05/05/2022 7:56 PM

## 2022-05-06 DIAGNOSIS — C3412 Malignant neoplasm of upper lobe, left bronchus or lung: Secondary | ICD-10-CM | POA: Diagnosis not present

## 2022-05-06 DIAGNOSIS — R634 Abnormal weight loss: Secondary | ICD-10-CM | POA: Diagnosis not present

## 2022-05-06 DIAGNOSIS — C7801 Secondary malignant neoplasm of right lung: Secondary | ICD-10-CM | POA: Diagnosis not present

## 2022-05-06 DIAGNOSIS — C7951 Secondary malignant neoplasm of bone: Secondary | ICD-10-CM | POA: Diagnosis not present

## 2022-05-06 DIAGNOSIS — L598 Other specified disorders of the skin and subcutaneous tissue related to radiation: Secondary | ICD-10-CM | POA: Diagnosis not present

## 2022-05-06 DIAGNOSIS — C7931 Secondary malignant neoplasm of brain: Secondary | ICD-10-CM | POA: Diagnosis not present

## 2022-05-08 DIAGNOSIS — Z6824 Body mass index (BMI) 24.0-24.9, adult: Secondary | ICD-10-CM | POA: Diagnosis not present

## 2022-05-08 DIAGNOSIS — N183 Chronic kidney disease, stage 3 unspecified: Secondary | ICD-10-CM | POA: Diagnosis not present

## 2022-05-08 DIAGNOSIS — C7801 Secondary malignant neoplasm of right lung: Secondary | ICD-10-CM | POA: Diagnosis not present

## 2022-05-08 DIAGNOSIS — I129 Hypertensive chronic kidney disease with stage 1 through stage 4 chronic kidney disease, or unspecified chronic kidney disease: Secondary | ICD-10-CM | POA: Diagnosis not present

## 2022-05-08 DIAGNOSIS — C7931 Secondary malignant neoplasm of brain: Secondary | ICD-10-CM | POA: Diagnosis not present

## 2022-05-08 DIAGNOSIS — Z8673 Personal history of transient ischemic attack (TIA), and cerebral infarction without residual deficits: Secondary | ICD-10-CM | POA: Diagnosis not present

## 2022-05-08 DIAGNOSIS — E1142 Type 2 diabetes mellitus with diabetic polyneuropathy: Secondary | ICD-10-CM | POA: Diagnosis not present

## 2022-05-08 DIAGNOSIS — E1122 Type 2 diabetes mellitus with diabetic chronic kidney disease: Secondary | ICD-10-CM | POA: Diagnosis not present

## 2022-05-08 DIAGNOSIS — C3412 Malignant neoplasm of upper lobe, left bronchus or lung: Secondary | ICD-10-CM | POA: Diagnosis not present

## 2022-05-08 DIAGNOSIS — R634 Abnormal weight loss: Secondary | ICD-10-CM | POA: Diagnosis not present

## 2022-05-08 DIAGNOSIS — C7951 Secondary malignant neoplasm of bone: Secondary | ICD-10-CM | POA: Diagnosis not present

## 2022-05-08 DIAGNOSIS — Z86711 Personal history of pulmonary embolism: Secondary | ICD-10-CM | POA: Diagnosis not present

## 2022-05-08 DIAGNOSIS — G40909 Epilepsy, unspecified, not intractable, without status epilepticus: Secondary | ICD-10-CM | POA: Diagnosis not present

## 2022-05-08 DIAGNOSIS — E785 Hyperlipidemia, unspecified: Secondary | ICD-10-CM | POA: Diagnosis not present

## 2022-05-08 DIAGNOSIS — N400 Enlarged prostate without lower urinary tract symptoms: Secondary | ICD-10-CM | POA: Diagnosis not present

## 2022-05-08 DIAGNOSIS — L598 Other specified disorders of the skin and subcutaneous tissue related to radiation: Secondary | ICD-10-CM | POA: Diagnosis not present

## 2022-05-13 DIAGNOSIS — C7951 Secondary malignant neoplasm of bone: Secondary | ICD-10-CM | POA: Diagnosis not present

## 2022-05-13 DIAGNOSIS — C7801 Secondary malignant neoplasm of right lung: Secondary | ICD-10-CM | POA: Diagnosis not present

## 2022-05-13 DIAGNOSIS — L598 Other specified disorders of the skin and subcutaneous tissue related to radiation: Secondary | ICD-10-CM | POA: Diagnosis not present

## 2022-05-13 DIAGNOSIS — C3412 Malignant neoplasm of upper lobe, left bronchus or lung: Secondary | ICD-10-CM | POA: Diagnosis not present

## 2022-05-13 DIAGNOSIS — C7931 Secondary malignant neoplasm of brain: Secondary | ICD-10-CM | POA: Diagnosis not present

## 2022-05-13 DIAGNOSIS — R634 Abnormal weight loss: Secondary | ICD-10-CM | POA: Diagnosis not present

## 2022-05-20 DIAGNOSIS — L598 Other specified disorders of the skin and subcutaneous tissue related to radiation: Secondary | ICD-10-CM | POA: Diagnosis not present

## 2022-05-20 DIAGNOSIS — C7931 Secondary malignant neoplasm of brain: Secondary | ICD-10-CM | POA: Diagnosis not present

## 2022-05-20 DIAGNOSIS — C7801 Secondary malignant neoplasm of right lung: Secondary | ICD-10-CM | POA: Diagnosis not present

## 2022-05-20 DIAGNOSIS — C3412 Malignant neoplasm of upper lobe, left bronchus or lung: Secondary | ICD-10-CM | POA: Diagnosis not present

## 2022-05-20 DIAGNOSIS — R634 Abnormal weight loss: Secondary | ICD-10-CM | POA: Diagnosis not present

## 2022-05-20 DIAGNOSIS — C7951 Secondary malignant neoplasm of bone: Secondary | ICD-10-CM | POA: Diagnosis not present

## 2022-05-22 ENCOUNTER — Telehealth: Payer: Self-pay | Admitting: Hospice and Palliative Medicine

## 2022-05-22 DIAGNOSIS — C7951 Secondary malignant neoplasm of bone: Secondary | ICD-10-CM | POA: Diagnosis not present

## 2022-05-22 DIAGNOSIS — R634 Abnormal weight loss: Secondary | ICD-10-CM | POA: Diagnosis not present

## 2022-05-22 DIAGNOSIS — L598 Other specified disorders of the skin and subcutaneous tissue related to radiation: Secondary | ICD-10-CM | POA: Diagnosis not present

## 2022-05-22 DIAGNOSIS — C7801 Secondary malignant neoplasm of right lung: Secondary | ICD-10-CM | POA: Diagnosis not present

## 2022-05-22 DIAGNOSIS — C3412 Malignant neoplasm of upper lobe, left bronchus or lung: Secondary | ICD-10-CM | POA: Diagnosis not present

## 2022-05-22 DIAGNOSIS — C7931 Secondary malignant neoplasm of brain: Secondary | ICD-10-CM | POA: Diagnosis not present

## 2022-05-22 NOTE — Telephone Encounter (Signed)
I spoke with hospice nurse, April.  Yesterday, patient had small amount of hemoptysis followed by scant epistaxis but both resolved spontaneously and not recurred.  Patient is on Eliquis given history of PE.  Recommended monitoring for now.

## 2022-05-27 DIAGNOSIS — R634 Abnormal weight loss: Secondary | ICD-10-CM | POA: Diagnosis not present

## 2022-05-27 DIAGNOSIS — C3412 Malignant neoplasm of upper lobe, left bronchus or lung: Secondary | ICD-10-CM | POA: Diagnosis not present

## 2022-05-27 DIAGNOSIS — L598 Other specified disorders of the skin and subcutaneous tissue related to radiation: Secondary | ICD-10-CM | POA: Diagnosis not present

## 2022-05-27 DIAGNOSIS — C7801 Secondary malignant neoplasm of right lung: Secondary | ICD-10-CM | POA: Diagnosis not present

## 2022-05-27 DIAGNOSIS — C7951 Secondary malignant neoplasm of bone: Secondary | ICD-10-CM | POA: Diagnosis not present

## 2022-05-27 DIAGNOSIS — C7931 Secondary malignant neoplasm of brain: Secondary | ICD-10-CM | POA: Diagnosis not present

## 2022-05-28 DIAGNOSIS — C3412 Malignant neoplasm of upper lobe, left bronchus or lung: Secondary | ICD-10-CM | POA: Diagnosis not present

## 2022-05-28 DIAGNOSIS — R634 Abnormal weight loss: Secondary | ICD-10-CM | POA: Diagnosis not present

## 2022-05-28 DIAGNOSIS — C7931 Secondary malignant neoplasm of brain: Secondary | ICD-10-CM | POA: Diagnosis not present

## 2022-05-28 DIAGNOSIS — L598 Other specified disorders of the skin and subcutaneous tissue related to radiation: Secondary | ICD-10-CM | POA: Diagnosis not present

## 2022-05-28 DIAGNOSIS — C7801 Secondary malignant neoplasm of right lung: Secondary | ICD-10-CM | POA: Diagnosis not present

## 2022-05-28 DIAGNOSIS — C7951 Secondary malignant neoplasm of bone: Secondary | ICD-10-CM | POA: Diagnosis not present

## 2022-06-02 ENCOUNTER — Telehealth: Payer: Self-pay

## 2022-06-02 DIAGNOSIS — C3412 Malignant neoplasm of upper lobe, left bronchus or lung: Secondary | ICD-10-CM | POA: Diagnosis not present

## 2022-06-02 DIAGNOSIS — C7951 Secondary malignant neoplasm of bone: Secondary | ICD-10-CM | POA: Diagnosis not present

## 2022-06-02 DIAGNOSIS — R634 Abnormal weight loss: Secondary | ICD-10-CM | POA: Diagnosis not present

## 2022-06-02 DIAGNOSIS — L598 Other specified disorders of the skin and subcutaneous tissue related to radiation: Secondary | ICD-10-CM | POA: Diagnosis not present

## 2022-06-02 DIAGNOSIS — C7931 Secondary malignant neoplasm of brain: Secondary | ICD-10-CM | POA: Diagnosis not present

## 2022-06-02 DIAGNOSIS — C7801 Secondary malignant neoplasm of right lung: Secondary | ICD-10-CM | POA: Diagnosis not present

## 2022-06-02 NOTE — Telephone Encounter (Signed)
FMLA form received for Melvin Willis.  Attempted to call [name] if she would like continuous or intermittent FMLA but no answer or VM no set up.

## 2022-06-04 DIAGNOSIS — C7951 Secondary malignant neoplasm of bone: Secondary | ICD-10-CM | POA: Diagnosis not present

## 2022-06-04 DIAGNOSIS — L598 Other specified disorders of the skin and subcutaneous tissue related to radiation: Secondary | ICD-10-CM | POA: Diagnosis not present

## 2022-06-04 DIAGNOSIS — C3412 Malignant neoplasm of upper lobe, left bronchus or lung: Secondary | ICD-10-CM | POA: Diagnosis not present

## 2022-06-04 DIAGNOSIS — R634 Abnormal weight loss: Secondary | ICD-10-CM | POA: Diagnosis not present

## 2022-06-04 DIAGNOSIS — C7931 Secondary malignant neoplasm of brain: Secondary | ICD-10-CM | POA: Diagnosis not present

## 2022-06-04 DIAGNOSIS — C7801 Secondary malignant neoplasm of right lung: Secondary | ICD-10-CM | POA: Diagnosis not present

## 2022-06-04 NOTE — Telephone Encounter (Signed)
FMLA form completed and pending MD signature.  Will contact Ms. Maricela Bo when ready for pick up

## 2022-06-07 DIAGNOSIS — N400 Enlarged prostate without lower urinary tract symptoms: Secondary | ICD-10-CM | POA: Diagnosis not present

## 2022-06-07 DIAGNOSIS — E785 Hyperlipidemia, unspecified: Secondary | ICD-10-CM | POA: Diagnosis not present

## 2022-06-07 DIAGNOSIS — G40909 Epilepsy, unspecified, not intractable, without status epilepticus: Secondary | ICD-10-CM | POA: Diagnosis not present

## 2022-06-07 DIAGNOSIS — E1122 Type 2 diabetes mellitus with diabetic chronic kidney disease: Secondary | ICD-10-CM | POA: Diagnosis not present

## 2022-06-07 DIAGNOSIS — C7951 Secondary malignant neoplasm of bone: Secondary | ICD-10-CM | POA: Diagnosis not present

## 2022-06-07 DIAGNOSIS — C7931 Secondary malignant neoplasm of brain: Secondary | ICD-10-CM | POA: Diagnosis not present

## 2022-06-07 DIAGNOSIS — R634 Abnormal weight loss: Secondary | ICD-10-CM | POA: Diagnosis not present

## 2022-06-07 DIAGNOSIS — C7801 Secondary malignant neoplasm of right lung: Secondary | ICD-10-CM | POA: Diagnosis not present

## 2022-06-07 DIAGNOSIS — Z6824 Body mass index (BMI) 24.0-24.9, adult: Secondary | ICD-10-CM | POA: Diagnosis not present

## 2022-06-07 DIAGNOSIS — Z86711 Personal history of pulmonary embolism: Secondary | ICD-10-CM | POA: Diagnosis not present

## 2022-06-07 DIAGNOSIS — Z8673 Personal history of transient ischemic attack (TIA), and cerebral infarction without residual deficits: Secondary | ICD-10-CM | POA: Diagnosis not present

## 2022-06-07 DIAGNOSIS — L598 Other specified disorders of the skin and subcutaneous tissue related to radiation: Secondary | ICD-10-CM | POA: Diagnosis not present

## 2022-06-07 DIAGNOSIS — N183 Chronic kidney disease, stage 3 unspecified: Secondary | ICD-10-CM | POA: Diagnosis not present

## 2022-06-07 DIAGNOSIS — C3412 Malignant neoplasm of upper lobe, left bronchus or lung: Secondary | ICD-10-CM | POA: Diagnosis not present

## 2022-06-07 DIAGNOSIS — E1142 Type 2 diabetes mellitus with diabetic polyneuropathy: Secondary | ICD-10-CM | POA: Diagnosis not present

## 2022-06-07 DIAGNOSIS — I129 Hypertensive chronic kidney disease with stage 1 through stage 4 chronic kidney disease, or unspecified chronic kidney disease: Secondary | ICD-10-CM | POA: Diagnosis not present

## 2022-06-08 ENCOUNTER — Telehealth: Payer: Self-pay | Admitting: *Deleted

## 2022-06-08 NOTE — Telephone Encounter (Signed)
Call placed to patients wife to inform that FMLA forms have been completed and are ready for pick up. Will place copy at registration for Melisa to pick up. Copy scanned to chart.

## 2022-06-09 DIAGNOSIS — C3412 Malignant neoplasm of upper lobe, left bronchus or lung: Secondary | ICD-10-CM | POA: Diagnosis not present

## 2022-06-09 DIAGNOSIS — L598 Other specified disorders of the skin and subcutaneous tissue related to radiation: Secondary | ICD-10-CM | POA: Diagnosis not present

## 2022-06-09 DIAGNOSIS — R634 Abnormal weight loss: Secondary | ICD-10-CM | POA: Diagnosis not present

## 2022-06-09 DIAGNOSIS — C7951 Secondary malignant neoplasm of bone: Secondary | ICD-10-CM | POA: Diagnosis not present

## 2022-06-09 DIAGNOSIS — C7931 Secondary malignant neoplasm of brain: Secondary | ICD-10-CM | POA: Diagnosis not present

## 2022-06-09 DIAGNOSIS — C7801 Secondary malignant neoplasm of right lung: Secondary | ICD-10-CM | POA: Diagnosis not present

## 2022-06-16 ENCOUNTER — Other Ambulatory Visit: Payer: Self-pay | Admitting: Internal Medicine

## 2022-06-16 DIAGNOSIS — C7931 Secondary malignant neoplasm of brain: Secondary | ICD-10-CM | POA: Diagnosis not present

## 2022-06-16 DIAGNOSIS — C7951 Secondary malignant neoplasm of bone: Secondary | ICD-10-CM | POA: Diagnosis not present

## 2022-06-16 DIAGNOSIS — R634 Abnormal weight loss: Secondary | ICD-10-CM | POA: Diagnosis not present

## 2022-06-16 DIAGNOSIS — C7801 Secondary malignant neoplasm of right lung: Secondary | ICD-10-CM | POA: Diagnosis not present

## 2022-06-16 DIAGNOSIS — C3412 Malignant neoplasm of upper lobe, left bronchus or lung: Secondary | ICD-10-CM | POA: Diagnosis not present

## 2022-06-16 DIAGNOSIS — L598 Other specified disorders of the skin and subcutaneous tissue related to radiation: Secondary | ICD-10-CM | POA: Diagnosis not present

## 2022-06-23 DIAGNOSIS — R634 Abnormal weight loss: Secondary | ICD-10-CM | POA: Diagnosis not present

## 2022-06-23 DIAGNOSIS — C3412 Malignant neoplasm of upper lobe, left bronchus or lung: Secondary | ICD-10-CM | POA: Diagnosis not present

## 2022-06-23 DIAGNOSIS — L598 Other specified disorders of the skin and subcutaneous tissue related to radiation: Secondary | ICD-10-CM | POA: Diagnosis not present

## 2022-06-23 DIAGNOSIS — C7951 Secondary malignant neoplasm of bone: Secondary | ICD-10-CM | POA: Diagnosis not present

## 2022-06-23 DIAGNOSIS — C7801 Secondary malignant neoplasm of right lung: Secondary | ICD-10-CM | POA: Diagnosis not present

## 2022-06-23 DIAGNOSIS — C7931 Secondary malignant neoplasm of brain: Secondary | ICD-10-CM | POA: Diagnosis not present

## 2022-06-30 DIAGNOSIS — C7951 Secondary malignant neoplasm of bone: Secondary | ICD-10-CM | POA: Diagnosis not present

## 2022-06-30 DIAGNOSIS — C3412 Malignant neoplasm of upper lobe, left bronchus or lung: Secondary | ICD-10-CM | POA: Diagnosis not present

## 2022-06-30 DIAGNOSIS — R634 Abnormal weight loss: Secondary | ICD-10-CM | POA: Diagnosis not present

## 2022-06-30 DIAGNOSIS — C7801 Secondary malignant neoplasm of right lung: Secondary | ICD-10-CM | POA: Diagnosis not present

## 2022-06-30 DIAGNOSIS — L598 Other specified disorders of the skin and subcutaneous tissue related to radiation: Secondary | ICD-10-CM | POA: Diagnosis not present

## 2022-06-30 DIAGNOSIS — C7931 Secondary malignant neoplasm of brain: Secondary | ICD-10-CM | POA: Diagnosis not present

## 2022-07-06 ENCOUNTER — Encounter (INDEPENDENT_AMBULATORY_CARE_PROVIDER_SITE_OTHER): Payer: Self-pay

## 2022-07-07 DIAGNOSIS — C7951 Secondary malignant neoplasm of bone: Secondary | ICD-10-CM | POA: Diagnosis not present

## 2022-07-07 DIAGNOSIS — C7931 Secondary malignant neoplasm of brain: Secondary | ICD-10-CM | POA: Diagnosis not present

## 2022-07-07 DIAGNOSIS — R634 Abnormal weight loss: Secondary | ICD-10-CM | POA: Diagnosis not present

## 2022-07-07 DIAGNOSIS — C7801 Secondary malignant neoplasm of right lung: Secondary | ICD-10-CM | POA: Diagnosis not present

## 2022-07-07 DIAGNOSIS — L598 Other specified disorders of the skin and subcutaneous tissue related to radiation: Secondary | ICD-10-CM | POA: Diagnosis not present

## 2022-07-07 DIAGNOSIS — C3412 Malignant neoplasm of upper lobe, left bronchus or lung: Secondary | ICD-10-CM | POA: Diagnosis not present

## 2022-07-08 DIAGNOSIS — R627 Adult failure to thrive: Secondary | ICD-10-CM | POA: Diagnosis not present

## 2022-07-08 DIAGNOSIS — N3944 Nocturnal enuresis: Secondary | ICD-10-CM | POA: Diagnosis not present

## 2022-07-08 DIAGNOSIS — N400 Enlarged prostate without lower urinary tract symptoms: Secondary | ICD-10-CM | POA: Diagnosis not present

## 2022-07-08 DIAGNOSIS — Z794 Long term (current) use of insulin: Secondary | ICD-10-CM | POA: Diagnosis not present

## 2022-07-08 DIAGNOSIS — C7931 Secondary malignant neoplasm of brain: Secondary | ICD-10-CM | POA: Diagnosis not present

## 2022-07-08 DIAGNOSIS — Z923 Personal history of irradiation: Secondary | ICD-10-CM | POA: Diagnosis not present

## 2022-07-08 DIAGNOSIS — R042 Hemoptysis: Secondary | ICD-10-CM | POA: Diagnosis not present

## 2022-07-08 DIAGNOSIS — C3412 Malignant neoplasm of upper lobe, left bronchus or lung: Secondary | ICD-10-CM | POA: Diagnosis not present

## 2022-07-08 DIAGNOSIS — C7951 Secondary malignant neoplasm of bone: Secondary | ICD-10-CM | POA: Diagnosis not present

## 2022-07-08 DIAGNOSIS — F419 Anxiety disorder, unspecified: Secondary | ICD-10-CM | POA: Diagnosis not present

## 2022-07-08 DIAGNOSIS — R634 Abnormal weight loss: Secondary | ICD-10-CM | POA: Diagnosis not present

## 2022-07-08 DIAGNOSIS — R630 Anorexia: Secondary | ICD-10-CM | POA: Diagnosis not present

## 2022-07-08 DIAGNOSIS — N183 Chronic kidney disease, stage 3 unspecified: Secondary | ICD-10-CM | POA: Diagnosis not present

## 2022-07-08 DIAGNOSIS — L598 Other specified disorders of the skin and subcutaneous tissue related to radiation: Secondary | ICD-10-CM | POA: Diagnosis not present

## 2022-07-08 DIAGNOSIS — Z9981 Dependence on supplemental oxygen: Secondary | ICD-10-CM | POA: Diagnosis not present

## 2022-07-08 DIAGNOSIS — Z86711 Personal history of pulmonary embolism: Secondary | ICD-10-CM | POA: Diagnosis not present

## 2022-07-08 DIAGNOSIS — C7801 Secondary malignant neoplasm of right lung: Secondary | ICD-10-CM | POA: Diagnosis not present

## 2022-07-08 DIAGNOSIS — E1142 Type 2 diabetes mellitus with diabetic polyneuropathy: Secondary | ICD-10-CM | POA: Diagnosis not present

## 2022-07-08 DIAGNOSIS — I129 Hypertensive chronic kidney disease with stage 1 through stage 4 chronic kidney disease, or unspecified chronic kidney disease: Secondary | ICD-10-CM | POA: Diagnosis not present

## 2022-07-08 DIAGNOSIS — Z8673 Personal history of transient ischemic attack (TIA), and cerebral infarction without residual deficits: Secondary | ICD-10-CM | POA: Diagnosis not present

## 2022-07-08 DIAGNOSIS — E785 Hyperlipidemia, unspecified: Secondary | ICD-10-CM | POA: Diagnosis not present

## 2022-07-08 DIAGNOSIS — Z9221 Personal history of antineoplastic chemotherapy: Secondary | ICD-10-CM | POA: Diagnosis not present

## 2022-07-08 DIAGNOSIS — I679 Cerebrovascular disease, unspecified: Secondary | ICD-10-CM | POA: Diagnosis not present

## 2022-07-08 DIAGNOSIS — G40909 Epilepsy, unspecified, not intractable, without status epilepticus: Secondary | ICD-10-CM | POA: Diagnosis not present

## 2022-07-08 DIAGNOSIS — E1122 Type 2 diabetes mellitus with diabetic chronic kidney disease: Secondary | ICD-10-CM | POA: Diagnosis not present

## 2022-07-09 DIAGNOSIS — C3412 Malignant neoplasm of upper lobe, left bronchus or lung: Secondary | ICD-10-CM | POA: Diagnosis not present

## 2022-07-09 DIAGNOSIS — R634 Abnormal weight loss: Secondary | ICD-10-CM | POA: Diagnosis not present

## 2022-07-09 DIAGNOSIS — C7801 Secondary malignant neoplasm of right lung: Secondary | ICD-10-CM | POA: Diagnosis not present

## 2022-07-09 DIAGNOSIS — C7951 Secondary malignant neoplasm of bone: Secondary | ICD-10-CM | POA: Diagnosis not present

## 2022-07-09 DIAGNOSIS — L598 Other specified disorders of the skin and subcutaneous tissue related to radiation: Secondary | ICD-10-CM | POA: Diagnosis not present

## 2022-07-09 DIAGNOSIS — C7931 Secondary malignant neoplasm of brain: Secondary | ICD-10-CM | POA: Diagnosis not present

## 2022-07-15 ENCOUNTER — Other Ambulatory Visit: Payer: Self-pay | Admitting: Internal Medicine

## 2022-07-15 DIAGNOSIS — C7931 Secondary malignant neoplasm of brain: Secondary | ICD-10-CM | POA: Diagnosis not present

## 2022-07-15 DIAGNOSIS — C7951 Secondary malignant neoplasm of bone: Secondary | ICD-10-CM | POA: Diagnosis not present

## 2022-07-15 DIAGNOSIS — R634 Abnormal weight loss: Secondary | ICD-10-CM | POA: Diagnosis not present

## 2022-07-15 DIAGNOSIS — C3412 Malignant neoplasm of upper lobe, left bronchus or lung: Secondary | ICD-10-CM | POA: Diagnosis not present

## 2022-07-15 DIAGNOSIS — C7801 Secondary malignant neoplasm of right lung: Secondary | ICD-10-CM | POA: Diagnosis not present

## 2022-07-15 DIAGNOSIS — L598 Other specified disorders of the skin and subcutaneous tissue related to radiation: Secondary | ICD-10-CM | POA: Diagnosis not present

## 2022-07-22 DIAGNOSIS — C7931 Secondary malignant neoplasm of brain: Secondary | ICD-10-CM | POA: Diagnosis not present

## 2022-07-22 DIAGNOSIS — L598 Other specified disorders of the skin and subcutaneous tissue related to radiation: Secondary | ICD-10-CM | POA: Diagnosis not present

## 2022-07-22 DIAGNOSIS — C3412 Malignant neoplasm of upper lobe, left bronchus or lung: Secondary | ICD-10-CM | POA: Diagnosis not present

## 2022-07-22 DIAGNOSIS — C7951 Secondary malignant neoplasm of bone: Secondary | ICD-10-CM | POA: Diagnosis not present

## 2022-07-22 DIAGNOSIS — C7801 Secondary malignant neoplasm of right lung: Secondary | ICD-10-CM | POA: Diagnosis not present

## 2022-07-22 DIAGNOSIS — R634 Abnormal weight loss: Secondary | ICD-10-CM | POA: Diagnosis not present

## 2022-07-27 DIAGNOSIS — R634 Abnormal weight loss: Secondary | ICD-10-CM | POA: Diagnosis not present

## 2022-07-27 DIAGNOSIS — C7951 Secondary malignant neoplasm of bone: Secondary | ICD-10-CM | POA: Diagnosis not present

## 2022-07-27 DIAGNOSIS — C7801 Secondary malignant neoplasm of right lung: Secondary | ICD-10-CM | POA: Diagnosis not present

## 2022-07-27 DIAGNOSIS — C7931 Secondary malignant neoplasm of brain: Secondary | ICD-10-CM | POA: Diagnosis not present

## 2022-07-27 DIAGNOSIS — C3412 Malignant neoplasm of upper lobe, left bronchus or lung: Secondary | ICD-10-CM | POA: Diagnosis not present

## 2022-07-27 DIAGNOSIS — L598 Other specified disorders of the skin and subcutaneous tissue related to radiation: Secondary | ICD-10-CM | POA: Diagnosis not present

## 2022-08-03 DIAGNOSIS — C3412 Malignant neoplasm of upper lobe, left bronchus or lung: Secondary | ICD-10-CM | POA: Diagnosis not present

## 2022-08-03 DIAGNOSIS — C7951 Secondary malignant neoplasm of bone: Secondary | ICD-10-CM | POA: Diagnosis not present

## 2022-08-03 DIAGNOSIS — L598 Other specified disorders of the skin and subcutaneous tissue related to radiation: Secondary | ICD-10-CM | POA: Diagnosis not present

## 2022-08-03 DIAGNOSIS — R634 Abnormal weight loss: Secondary | ICD-10-CM | POA: Diagnosis not present

## 2022-08-03 DIAGNOSIS — C7801 Secondary malignant neoplasm of right lung: Secondary | ICD-10-CM | POA: Diagnosis not present

## 2022-08-03 DIAGNOSIS — C7931 Secondary malignant neoplasm of brain: Secondary | ICD-10-CM | POA: Diagnosis not present

## 2022-08-06 DIAGNOSIS — L598 Other specified disorders of the skin and subcutaneous tissue related to radiation: Secondary | ICD-10-CM | POA: Diagnosis not present

## 2022-08-06 DIAGNOSIS — C7951 Secondary malignant neoplasm of bone: Secondary | ICD-10-CM | POA: Diagnosis not present

## 2022-08-06 DIAGNOSIS — R634 Abnormal weight loss: Secondary | ICD-10-CM | POA: Diagnosis not present

## 2022-08-06 DIAGNOSIS — C7931 Secondary malignant neoplasm of brain: Secondary | ICD-10-CM | POA: Diagnosis not present

## 2022-08-06 DIAGNOSIS — C7801 Secondary malignant neoplasm of right lung: Secondary | ICD-10-CM | POA: Diagnosis not present

## 2022-08-06 DIAGNOSIS — C3412 Malignant neoplasm of upper lobe, left bronchus or lung: Secondary | ICD-10-CM | POA: Diagnosis not present

## 2022-08-07 DIAGNOSIS — C7951 Secondary malignant neoplasm of bone: Secondary | ICD-10-CM | POA: Diagnosis not present

## 2022-08-07 DIAGNOSIS — Z923 Personal history of irradiation: Secondary | ICD-10-CM | POA: Diagnosis not present

## 2022-08-07 DIAGNOSIS — R630 Anorexia: Secondary | ICD-10-CM | POA: Diagnosis not present

## 2022-08-07 DIAGNOSIS — R634 Abnormal weight loss: Secondary | ICD-10-CM | POA: Diagnosis not present

## 2022-08-07 DIAGNOSIS — R627 Adult failure to thrive: Secondary | ICD-10-CM | POA: Diagnosis not present

## 2022-08-07 DIAGNOSIS — G40909 Epilepsy, unspecified, not intractable, without status epilepticus: Secondary | ICD-10-CM | POA: Diagnosis not present

## 2022-08-07 DIAGNOSIS — E1142 Type 2 diabetes mellitus with diabetic polyneuropathy: Secondary | ICD-10-CM | POA: Diagnosis not present

## 2022-08-07 DIAGNOSIS — Z794 Long term (current) use of insulin: Secondary | ICD-10-CM | POA: Diagnosis not present

## 2022-08-07 DIAGNOSIS — N183 Chronic kidney disease, stage 3 unspecified: Secondary | ICD-10-CM | POA: Diagnosis not present

## 2022-08-07 DIAGNOSIS — Z9981 Dependence on supplemental oxygen: Secondary | ICD-10-CM | POA: Diagnosis not present

## 2022-08-07 DIAGNOSIS — I679 Cerebrovascular disease, unspecified: Secondary | ICD-10-CM | POA: Diagnosis not present

## 2022-08-07 DIAGNOSIS — E1122 Type 2 diabetes mellitus with diabetic chronic kidney disease: Secondary | ICD-10-CM | POA: Diagnosis not present

## 2022-08-07 DIAGNOSIS — Z8673 Personal history of transient ischemic attack (TIA), and cerebral infarction without residual deficits: Secondary | ICD-10-CM | POA: Diagnosis not present

## 2022-08-07 DIAGNOSIS — C3412 Malignant neoplasm of upper lobe, left bronchus or lung: Secondary | ICD-10-CM | POA: Diagnosis not present

## 2022-08-07 DIAGNOSIS — C7931 Secondary malignant neoplasm of brain: Secondary | ICD-10-CM | POA: Diagnosis not present

## 2022-08-07 DIAGNOSIS — Z9221 Personal history of antineoplastic chemotherapy: Secondary | ICD-10-CM | POA: Diagnosis not present

## 2022-08-07 DIAGNOSIS — I129 Hypertensive chronic kidney disease with stage 1 through stage 4 chronic kidney disease, or unspecified chronic kidney disease: Secondary | ICD-10-CM | POA: Diagnosis not present

## 2022-08-07 DIAGNOSIS — C7801 Secondary malignant neoplasm of right lung: Secondary | ICD-10-CM | POA: Diagnosis not present

## 2022-08-07 DIAGNOSIS — N3944 Nocturnal enuresis: Secondary | ICD-10-CM | POA: Diagnosis not present

## 2022-08-07 DIAGNOSIS — N400 Enlarged prostate without lower urinary tract symptoms: Secondary | ICD-10-CM | POA: Diagnosis not present

## 2022-08-07 DIAGNOSIS — Z86711 Personal history of pulmonary embolism: Secondary | ICD-10-CM | POA: Diagnosis not present

## 2022-08-07 DIAGNOSIS — R042 Hemoptysis: Secondary | ICD-10-CM | POA: Diagnosis not present

## 2022-08-07 DIAGNOSIS — F419 Anxiety disorder, unspecified: Secondary | ICD-10-CM | POA: Diagnosis not present

## 2022-08-07 DIAGNOSIS — E785 Hyperlipidemia, unspecified: Secondary | ICD-10-CM | POA: Diagnosis not present

## 2022-08-07 DIAGNOSIS — L598 Other specified disorders of the skin and subcutaneous tissue related to radiation: Secondary | ICD-10-CM | POA: Diagnosis not present

## 2022-08-11 ENCOUNTER — Other Ambulatory Visit: Payer: Self-pay | Admitting: Internal Medicine

## 2022-08-11 DIAGNOSIS — L598 Other specified disorders of the skin and subcutaneous tissue related to radiation: Secondary | ICD-10-CM | POA: Diagnosis not present

## 2022-08-11 DIAGNOSIS — C7801 Secondary malignant neoplasm of right lung: Secondary | ICD-10-CM | POA: Diagnosis not present

## 2022-08-11 DIAGNOSIS — C7931 Secondary malignant neoplasm of brain: Secondary | ICD-10-CM | POA: Diagnosis not present

## 2022-08-11 DIAGNOSIS — R634 Abnormal weight loss: Secondary | ICD-10-CM | POA: Diagnosis not present

## 2022-08-11 DIAGNOSIS — C7951 Secondary malignant neoplasm of bone: Secondary | ICD-10-CM | POA: Diagnosis not present

## 2022-08-11 DIAGNOSIS — I82403 Acute embolism and thrombosis of unspecified deep veins of lower extremity, bilateral: Secondary | ICD-10-CM

## 2022-08-11 DIAGNOSIS — C3412 Malignant neoplasm of upper lobe, left bronchus or lung: Secondary | ICD-10-CM | POA: Diagnosis not present

## 2022-08-14 DIAGNOSIS — C7931 Secondary malignant neoplasm of brain: Secondary | ICD-10-CM | POA: Diagnosis not present

## 2022-08-14 DIAGNOSIS — C7951 Secondary malignant neoplasm of bone: Secondary | ICD-10-CM | POA: Diagnosis not present

## 2022-08-14 DIAGNOSIS — C3412 Malignant neoplasm of upper lobe, left bronchus or lung: Secondary | ICD-10-CM | POA: Diagnosis not present

## 2022-08-14 DIAGNOSIS — C7801 Secondary malignant neoplasm of right lung: Secondary | ICD-10-CM | POA: Diagnosis not present

## 2022-08-14 DIAGNOSIS — L598 Other specified disorders of the skin and subcutaneous tissue related to radiation: Secondary | ICD-10-CM | POA: Diagnosis not present

## 2022-08-14 DIAGNOSIS — R634 Abnormal weight loss: Secondary | ICD-10-CM | POA: Diagnosis not present

## 2022-08-15 DIAGNOSIS — C3412 Malignant neoplasm of upper lobe, left bronchus or lung: Secondary | ICD-10-CM | POA: Diagnosis not present

## 2022-08-15 DIAGNOSIS — C7801 Secondary malignant neoplasm of right lung: Secondary | ICD-10-CM | POA: Diagnosis not present

## 2022-08-15 DIAGNOSIS — C7951 Secondary malignant neoplasm of bone: Secondary | ICD-10-CM | POA: Diagnosis not present

## 2022-08-15 DIAGNOSIS — L598 Other specified disorders of the skin and subcutaneous tissue related to radiation: Secondary | ICD-10-CM | POA: Diagnosis not present

## 2022-08-15 DIAGNOSIS — R634 Abnormal weight loss: Secondary | ICD-10-CM | POA: Diagnosis not present

## 2022-08-15 DIAGNOSIS — C7931 Secondary malignant neoplasm of brain: Secondary | ICD-10-CM | POA: Diagnosis not present

## 2022-08-17 DIAGNOSIS — C3412 Malignant neoplasm of upper lobe, left bronchus or lung: Secondary | ICD-10-CM | POA: Diagnosis not present

## 2022-08-17 DIAGNOSIS — R634 Abnormal weight loss: Secondary | ICD-10-CM | POA: Diagnosis not present

## 2022-08-17 DIAGNOSIS — C7801 Secondary malignant neoplasm of right lung: Secondary | ICD-10-CM | POA: Diagnosis not present

## 2022-08-17 DIAGNOSIS — C7951 Secondary malignant neoplasm of bone: Secondary | ICD-10-CM | POA: Diagnosis not present

## 2022-08-17 DIAGNOSIS — C7931 Secondary malignant neoplasm of brain: Secondary | ICD-10-CM | POA: Diagnosis not present

## 2022-08-17 DIAGNOSIS — L598 Other specified disorders of the skin and subcutaneous tissue related to radiation: Secondary | ICD-10-CM | POA: Diagnosis not present

## 2022-08-18 ENCOUNTER — Telehealth: Payer: Self-pay | Admitting: *Deleted

## 2022-08-18 NOTE — Telephone Encounter (Signed)
FMLA for intermittent leave received and completed, sent for physician signature

## 2022-08-18 NOTE — Telephone Encounter (Signed)
FMLA form ssigned and emailed as per request

## 2022-08-19 DIAGNOSIS — C3412 Malignant neoplasm of upper lobe, left bronchus or lung: Secondary | ICD-10-CM | POA: Diagnosis not present

## 2022-08-19 DIAGNOSIS — R634 Abnormal weight loss: Secondary | ICD-10-CM | POA: Diagnosis not present

## 2022-08-19 DIAGNOSIS — C7801 Secondary malignant neoplasm of right lung: Secondary | ICD-10-CM | POA: Diagnosis not present

## 2022-08-19 DIAGNOSIS — C7931 Secondary malignant neoplasm of brain: Secondary | ICD-10-CM | POA: Diagnosis not present

## 2022-08-19 DIAGNOSIS — L598 Other specified disorders of the skin and subcutaneous tissue related to radiation: Secondary | ICD-10-CM | POA: Diagnosis not present

## 2022-08-19 DIAGNOSIS — C7951 Secondary malignant neoplasm of bone: Secondary | ICD-10-CM | POA: Diagnosis not present

## 2022-08-20 DIAGNOSIS — C7931 Secondary malignant neoplasm of brain: Secondary | ICD-10-CM | POA: Diagnosis not present

## 2022-08-20 DIAGNOSIS — L598 Other specified disorders of the skin and subcutaneous tissue related to radiation: Secondary | ICD-10-CM | POA: Diagnosis not present

## 2022-08-20 DIAGNOSIS — C3412 Malignant neoplasm of upper lobe, left bronchus or lung: Secondary | ICD-10-CM | POA: Diagnosis not present

## 2022-08-20 DIAGNOSIS — C7801 Secondary malignant neoplasm of right lung: Secondary | ICD-10-CM | POA: Diagnosis not present

## 2022-08-20 DIAGNOSIS — C7951 Secondary malignant neoplasm of bone: Secondary | ICD-10-CM | POA: Diagnosis not present

## 2022-08-20 DIAGNOSIS — Z743 Need for continuous supervision: Secondary | ICD-10-CM | POA: Diagnosis not present

## 2022-08-20 DIAGNOSIS — R404 Transient alteration of awareness: Secondary | ICD-10-CM | POA: Diagnosis not present

## 2022-08-20 DIAGNOSIS — R634 Abnormal weight loss: Secondary | ICD-10-CM | POA: Diagnosis not present

## 2022-08-21 DIAGNOSIS — C7931 Secondary malignant neoplasm of brain: Secondary | ICD-10-CM | POA: Diagnosis not present

## 2022-08-21 DIAGNOSIS — C7801 Secondary malignant neoplasm of right lung: Secondary | ICD-10-CM | POA: Diagnosis not present

## 2022-08-21 DIAGNOSIS — L598 Other specified disorders of the skin and subcutaneous tissue related to radiation: Secondary | ICD-10-CM | POA: Diagnosis not present

## 2022-08-21 DIAGNOSIS — R634 Abnormal weight loss: Secondary | ICD-10-CM | POA: Diagnosis not present

## 2022-08-21 DIAGNOSIS — C7951 Secondary malignant neoplasm of bone: Secondary | ICD-10-CM | POA: Diagnosis not present

## 2022-08-21 DIAGNOSIS — C3412 Malignant neoplasm of upper lobe, left bronchus or lung: Secondary | ICD-10-CM | POA: Diagnosis not present

## 2022-08-22 DIAGNOSIS — C7801 Secondary malignant neoplasm of right lung: Secondary | ICD-10-CM | POA: Diagnosis not present

## 2022-08-22 DIAGNOSIS — C7951 Secondary malignant neoplasm of bone: Secondary | ICD-10-CM | POA: Diagnosis not present

## 2022-08-22 DIAGNOSIS — C3412 Malignant neoplasm of upper lobe, left bronchus or lung: Secondary | ICD-10-CM | POA: Diagnosis not present

## 2022-08-22 DIAGNOSIS — L598 Other specified disorders of the skin and subcutaneous tissue related to radiation: Secondary | ICD-10-CM | POA: Diagnosis not present

## 2022-08-22 DIAGNOSIS — R634 Abnormal weight loss: Secondary | ICD-10-CM | POA: Diagnosis not present

## 2022-08-22 DIAGNOSIS — C7931 Secondary malignant neoplasm of brain: Secondary | ICD-10-CM | POA: Diagnosis not present

## 2022-09-01 ENCOUNTER — Telehealth: Payer: Self-pay | Admitting: Internal Medicine

## 2022-09-01 NOTE — Telephone Encounter (Signed)
I called patient's wife and offered my condolences.  Family very thankful for the call. Appreciate the cancer center staff for the care and support provided to the patient.

## 2022-09-07 DEATH — deceased

## 2022-09-24 ENCOUNTER — Ambulatory Visit: Payer: Medicare Other | Admitting: Urology
